# Patient Record
Sex: Female | Born: 1950
Health system: Southern US, Community
[De-identification: ages and names within clinical notes are randomized; demographics above are authoritative.]

## PROBLEM LIST (undated history)

## (undated) DIAGNOSIS — J679 Hypersensitivity pneumonitis due to unspecified organic dust: Secondary | ICD-10-CM

## (undated) DIAGNOSIS — J849 Interstitial pulmonary disease, unspecified: Secondary | ICD-10-CM

## (undated) DIAGNOSIS — E119 Type 2 diabetes mellitus without complications: Secondary | ICD-10-CM

## (undated) DIAGNOSIS — I10 Essential (primary) hypertension: Secondary | ICD-10-CM

## (undated) DIAGNOSIS — J9601 Acute respiratory failure with hypoxia: Secondary | ICD-10-CM

## (undated) DIAGNOSIS — E785 Hyperlipidemia, unspecified: Secondary | ICD-10-CM

## (undated) DIAGNOSIS — K219 Gastro-esophageal reflux disease without esophagitis: Secondary | ICD-10-CM

## (undated) DIAGNOSIS — I509 Heart failure, unspecified: Secondary | ICD-10-CM

## (undated) DIAGNOSIS — C9210 Chronic myeloid leukemia, BCR/ABL-positive, not having achieved remission: Secondary | ICD-10-CM

## (undated) DIAGNOSIS — D649 Anemia, unspecified: Secondary | ICD-10-CM

## (undated) HISTORY — PX: ABDOMINAL HYSTERECTOMY: SHX81

---
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
## 2017-11-05 DIAGNOSIS — C9210 Chronic myeloid leukemia, BCR/ABL-positive, not having achieved remission: Secondary | ICD-10-CM | POA: Insufficient documentation

## 2017-11-05 DIAGNOSIS — I10 Essential (primary) hypertension: Secondary | ICD-10-CM | POA: Insufficient documentation

## 2017-12-17 ENCOUNTER — Encounter (HOSPITAL_COMMUNITY): Payer: Self-pay | Admitting: Family Medicine

## 2017-12-17 ENCOUNTER — Ambulatory Visit (INDEPENDENT_AMBULATORY_CARE_PROVIDER_SITE_OTHER): Payer: Medicare Other

## 2017-12-17 ENCOUNTER — Ambulatory Visit (INDEPENDENT_AMBULATORY_CARE_PROVIDER_SITE_OTHER)
Admission: EM | Admit: 2017-12-17 | Discharge: 2017-12-17 | Disposition: A | Discharge status: Home / Self Care | Payer: Medicare Other | Source: Home / Self Care | Attending: Family Medicine | Admitting: Family Medicine

## 2017-12-17 ENCOUNTER — Inpatient Hospital Stay (HOSPITAL_COMMUNITY)
Admission: EM | Admit: 2017-12-17 | Discharge: 2017-12-20 | DRG: 196 | Disposition: A | Discharge status: Home / Self Care | Payer: Medicare Other | Attending: Family Medicine | Admitting: Family Medicine

## 2017-12-17 ENCOUNTER — Emergency Department (HOSPITAL_COMMUNITY): Payer: Medicare Other

## 2017-12-17 ENCOUNTER — Encounter (HOSPITAL_COMMUNITY): Payer: Self-pay

## 2017-12-17 DIAGNOSIS — D696 Thrombocytopenia, unspecified: Secondary | ICD-10-CM | POA: Diagnosis present

## 2017-12-17 DIAGNOSIS — R0600 Dyspnea, unspecified: Secondary | ICD-10-CM | POA: Diagnosis not present

## 2017-12-17 DIAGNOSIS — J9801 Acute bronchospasm: Secondary | ICD-10-CM | POA: Diagnosis present

## 2017-12-17 DIAGNOSIS — R0902 Hypoxemia: Secondary | ICD-10-CM

## 2017-12-17 DIAGNOSIS — E119 Type 2 diabetes mellitus without complications: Secondary | ICD-10-CM | POA: Diagnosis not present

## 2017-12-17 DIAGNOSIS — E785 Hyperlipidemia, unspecified: Secondary | ICD-10-CM | POA: Diagnosis present

## 2017-12-17 DIAGNOSIS — I700 Atherosclerosis of aorta: Secondary | ICD-10-CM | POA: Diagnosis present

## 2017-12-17 DIAGNOSIS — K219 Gastro-esophageal reflux disease without esophagitis: Secondary | ICD-10-CM | POA: Diagnosis present

## 2017-12-17 DIAGNOSIS — Z7984 Long term (current) use of oral hypoglycemic drugs: Secondary | ICD-10-CM | POA: Diagnosis not present

## 2017-12-17 DIAGNOSIS — Z7951 Long term (current) use of inhaled steroids: Secondary | ICD-10-CM | POA: Diagnosis not present

## 2017-12-17 DIAGNOSIS — E1165 Type 2 diabetes mellitus with hyperglycemia: Secondary | ICD-10-CM | POA: Diagnosis present

## 2017-12-17 DIAGNOSIS — Z79899 Other long term (current) drug therapy: Secondary | ICD-10-CM

## 2017-12-17 DIAGNOSIS — J679 Hypersensitivity pneumonitis due to unspecified organic dust: Principal | ICD-10-CM | POA: Diagnosis present

## 2017-12-17 DIAGNOSIS — Z888 Allergy status to other drugs, medicaments and biological substances status: Secondary | ICD-10-CM

## 2017-12-17 DIAGNOSIS — Z6841 Body Mass Index (BMI) 40.0 and over, adult: Secondary | ICD-10-CM

## 2017-12-17 DIAGNOSIS — J189 Pneumonia, unspecified organism: Secondary | ICD-10-CM

## 2017-12-17 DIAGNOSIS — J9601 Acute respiratory failure with hypoxia: Secondary | ICD-10-CM | POA: Diagnosis present

## 2017-12-17 DIAGNOSIS — J9621 Acute and chronic respiratory failure with hypoxia: Secondary | ICD-10-CM

## 2017-12-17 DIAGNOSIS — E118 Type 2 diabetes mellitus with unspecified complications: Secondary | ICD-10-CM

## 2017-12-17 DIAGNOSIS — T380X5A Adverse effect of glucocorticoids and synthetic analogues, initial encounter: Secondary | ICD-10-CM | POA: Diagnosis present

## 2017-12-17 DIAGNOSIS — J849 Interstitial pulmonary disease, unspecified: Secondary | ICD-10-CM | POA: Diagnosis present

## 2017-12-17 DIAGNOSIS — R0602 Shortness of breath: Secondary | ICD-10-CM

## 2017-12-17 DIAGNOSIS — B379 Candidiasis, unspecified: Secondary | ICD-10-CM | POA: Diagnosis present

## 2017-12-17 DIAGNOSIS — J9600 Acute respiratory failure, unspecified whether with hypoxia or hypercapnia: Secondary | ICD-10-CM

## 2017-12-17 DIAGNOSIS — Z794 Long term (current) use of insulin: Secondary | ICD-10-CM

## 2017-12-17 DIAGNOSIS — I10 Essential (primary) hypertension: Secondary | ICD-10-CM | POA: Diagnosis present

## 2017-12-17 DIAGNOSIS — J984 Other disorders of lung: Secondary | ICD-10-CM | POA: Diagnosis present

## 2017-12-17 HISTORY — DX: Hypersensitivity pneumonitis due to unspecified organic dust: J67.9

## 2017-12-17 HISTORY — DX: Type 2 diabetes mellitus without complications: E11.9

## 2017-12-17 HISTORY — DX: Essential (primary) hypertension: I10

## 2017-12-17 LAB — CBC
HCT: 43.6 % (ref 36.0–46.0)
Hemoglobin: 13.6 g/dL (ref 12.0–15.0)
MCH: 30.3 pg (ref 26.0–34.0)
MCHC: 31.2 g/dL (ref 30.0–36.0)
MCV: 97.1 fL (ref 78.0–100.0)
Platelets: 76 10*3/uL — ABNORMAL LOW (ref 150–400)
RBC: 4.49 MIL/uL (ref 3.87–5.11)
RDW: 21.1 % — ABNORMAL HIGH (ref 11.5–15.5)
WBC: 5.7 10*3/uL (ref 4.0–10.5)

## 2017-12-17 LAB — BASIC METABOLIC PANEL
Anion gap: 9 (ref 5–15)
BUN: 6 mg/dL — ABNORMAL LOW (ref 8–23)
CO2: 22 mmol/L (ref 22–32)
Calcium: 9.7 mg/dL (ref 8.9–10.3)
Chloride: 108 mmol/L (ref 98–111)
Creatinine, Ser: 0.7 mg/dL (ref 0.44–1.00)
GFR calc Af Amer: >60 mL/min (ref >60)
GFR calc non Af Amer: >60 mL/min (ref >60)
Glucose, Bld: 202 mg/dL — ABNORMAL HIGH (ref 70–99)
Potassium: 4.1 mmol/L (ref 3.5–5.1)
Sodium: 139 mmol/L (ref 135–145)

## 2017-12-17 LAB — BRAIN NATRIURETIC PEPTIDE: B Natriuretic Peptide: 9.1 pg/mL (ref 0.0–100.0)

## 2017-12-17 LAB — GLUCOSE, CAPILLARY
Glucose-Capillary: 367 mg/dL — ABNORMAL HIGH (ref 70–99)
Glucose-Capillary: 424 mg/dL — ABNORMAL HIGH (ref 70–99)

## 2017-12-17 LAB — I-STAT TROPONIN, ED: Troponin i, poc: 0.02 ng/mL (ref 0.00–0.08)

## 2017-12-17 MED ORDER — ONDANSETRON HCL 4 MG PO TABS: 4.0000 mg | ORAL_TABLET | Freq: Four times a day (QID) | ORAL | Status: DC | PRN

## 2017-12-17 MED ORDER — ENOXAPARIN SODIUM 40 MG/0.4ML ~~LOC~~ SOLN
40.0000 mg | SUBCUTANEOUS | Status: DC
  Administered 2017-12-17 – 2017-12-19 (×3): 40 mg via SUBCUTANEOUS
  Filled 2017-12-17 (×3): qty 0.4

## 2017-12-17 MED ORDER — IPRATROPIUM-ALBUTEROL 0.5-2.5 (3) MG/3ML IN SOLN
3.0000 mL | Freq: Once | RESPIRATORY_TRACT | Status: AC
  Administered 2017-12-17: 3 mL via RESPIRATORY_TRACT

## 2017-12-17 MED ORDER — IRBESARTAN 300 MG PO TABS
300.0000 mg | ORAL_TABLET | Freq: Every day | ORAL | Status: DC
  Administered 2017-12-17 – 2017-12-20 (×4): 300 mg via ORAL
  Filled 2017-12-17 (×4): qty 1

## 2017-12-17 MED ORDER — ONDANSETRON HCL 4 MG/2ML IJ SOLN: 4.0000 mg | Freq: Four times a day (QID) | INTRAMUSCULAR | Status: DC | PRN

## 2017-12-17 MED ORDER — TRAMADOL-ACETAMINOPHEN 37.5-325 MG PO TABS
1.0000 | ORAL_TABLET | Freq: Four times a day (QID) | ORAL | Status: DC | PRN
  Administered 2017-12-18 – 2017-12-20 (×3): 1 via ORAL
  Filled 2017-12-17 (×3): qty 1

## 2017-12-17 MED ORDER — INSULIN ASPART 100 UNIT/ML ~~LOC~~ SOLN
0.0000 [IU] | Freq: Every day | SUBCUTANEOUS | Status: DC
  Administered 2017-12-17: 5 [IU] via SUBCUTANEOUS

## 2017-12-17 MED ORDER — VITAMIN D (ERGOCALCIFEROL) 1.25 MG (50000 UNIT) PO CAPS
50000.0000 [IU] | ORAL_CAPSULE | ORAL | Status: DC
  Administered 2017-12-20: 50000 [IU] via ORAL
  Filled 2017-12-17: qty 1

## 2017-12-17 MED ORDER — IPRATROPIUM-ALBUTEROL 0.5-2.5 (3) MG/3ML IN SOLN
RESPIRATORY_TRACT | Status: AC
  Filled 2017-12-17: qty 3

## 2017-12-17 MED ORDER — IOPAMIDOL (ISOVUE-370) INJECTION 76%
INTRAVENOUS | Status: AC
  Filled 2017-12-17: qty 100

## 2017-12-17 MED ORDER — METHYLPREDNISOLONE SODIUM SUCC 125 MG IJ SOLR
125.0000 mg | Freq: Once | INTRAMUSCULAR | Status: AC
  Administered 2017-12-17: 125 mg via INTRAVENOUS
  Filled 2017-12-17: qty 2

## 2017-12-17 MED ORDER — SODIUM CHLORIDE 0.9 % IV SOLN
INTRAVENOUS | Status: DC
  Administered 2017-12-17 – 2017-12-18 (×2): via INTRAVENOUS

## 2017-12-17 MED ORDER — INSULIN LISPRO PROT & LISPRO (75-25 MIX) 100 UNIT/ML KWIKPEN: 40.0000 [IU] | PEN_INJECTOR | SUBCUTANEOUS | Status: DC

## 2017-12-17 MED ORDER — DOCUSATE SODIUM 100 MG PO CAPS
100.0000 mg | ORAL_CAPSULE | Freq: Two times a day (BID) | ORAL | Status: DC
  Administered 2017-12-17 – 2017-12-20 (×6): 100 mg via ORAL
  Filled 2017-12-17 (×6): qty 1

## 2017-12-17 MED ORDER — METHYLPREDNISOLONE SODIUM SUCC 125 MG IJ SOLR
INTRAMUSCULAR | Status: AC
  Filled 2017-12-17: qty 2

## 2017-12-17 MED ORDER — OXYBUTYNIN CHLORIDE ER 10 MG PO TB24
10.0000 mg | ORAL_TABLET | Freq: Every day | ORAL | Status: DC
  Administered 2017-12-18 – 2017-12-19 (×3): 10 mg via ORAL
  Filled 2017-12-17 (×3): qty 1

## 2017-12-17 MED ORDER — METHYLPREDNISOLONE SODIUM SUCC 125 MG IJ SOLR
80.0000 mg | Freq: Four times a day (QID) | INTRAMUSCULAR | Status: DC
  Administered 2017-12-17 – 2017-12-18 (×2): 80 mg via INTRAVENOUS
  Filled 2017-12-17 (×2): qty 2

## 2017-12-17 MED ORDER — INSULIN ASPART 100 UNIT/ML ~~LOC~~ SOLN
10.0000 [IU] | Freq: Once | SUBCUTANEOUS | Status: AC
  Administered 2017-12-18: 10 [IU] via SUBCUTANEOUS

## 2017-12-17 MED ORDER — ATORVASTATIN CALCIUM 20 MG PO TABS
20.0000 mg | ORAL_TABLET | Freq: Every day | ORAL | Status: DC
  Administered 2017-12-18 – 2017-12-20 (×3): 20 mg via ORAL
  Filled 2017-12-17 (×3): qty 1

## 2017-12-17 MED ORDER — IOPAMIDOL (ISOVUE-370) INJECTION 76%
100.0000 mL | Freq: Once | INTRAVENOUS | Status: AC | PRN
  Administered 2017-12-17: 100 mL via INTRAVENOUS

## 2017-12-17 MED ORDER — INSULIN ASPART 100 UNIT/ML ~~LOC~~ SOLN
0.0000 [IU] | Freq: Three times a day (TID) | SUBCUTANEOUS | Status: DC
  Administered 2017-12-18: 5 [IU] via SUBCUTANEOUS

## 2017-12-17 MED ORDER — MONTELUKAST SODIUM 10 MG PO TABS
10.0000 mg | ORAL_TABLET | Freq: Every day | ORAL | Status: DC
  Administered 2017-12-17 – 2017-12-19 (×3): 10 mg via ORAL
  Filled 2017-12-17 (×3): qty 1

## 2017-12-17 MED ORDER — IPRATROPIUM-ALBUTEROL 0.5-2.5 (3) MG/3ML IN SOLN: 3.0000 mL | Freq: Four times a day (QID) | RESPIRATORY_TRACT | Status: DC | PRN

## 2017-12-17 MED ORDER — METHYLPREDNISOLONE SODIUM SUCC 125 MG IJ SOLR
125.0000 mg | Freq: Once | INTRAMUSCULAR | Status: AC
  Administered 2017-12-17: 125 mg via INTRAVENOUS

## 2017-12-17 MED ORDER — LORATADINE 10 MG PO TABS
10.0000 mg | ORAL_TABLET | Freq: Every day | ORAL | Status: DC
  Administered 2017-12-18 – 2017-12-20 (×3): 10 mg via ORAL
  Filled 2017-12-17 (×3): qty 1

## 2017-12-17 MED ORDER — INSULIN ASPART PROT & ASPART (70-30 MIX) 100 UNIT/ML ~~LOC~~ SUSP
40.0000 [IU] | Freq: Every day | SUBCUTANEOUS | Status: DC
  Administered 2017-12-18: 40 [IU] via SUBCUTANEOUS
  Filled 2017-12-17: qty 10

## 2017-12-17 MED ORDER — INSULIN ASPART 100 UNIT/ML ~~LOC~~ SOLN: 0.0000 [IU] | Freq: Three times a day (TID) | SUBCUTANEOUS | Status: DC

## 2017-12-17 MED ORDER — IPRATROPIUM-ALBUTEROL 0.5-2.5 (3) MG/3ML IN SOLN
3.0000 mL | Freq: Four times a day (QID) | RESPIRATORY_TRACT | Status: DC
  Administered 2017-12-17: 3 mL via RESPIRATORY_TRACT
  Filled 2017-12-17: qty 3

## 2017-12-17 MED ORDER — GLIMEPIRIDE 2 MG PO TABS
2.0000 mg | ORAL_TABLET | Freq: Every day | ORAL | Status: DC
  Administered 2017-12-18: 2 mg via ORAL
  Filled 2017-12-17: qty 1

## 2017-12-17 MED ORDER — FEXOFENADINE HCL 180 MG PO TABS: 180.0000 mg | ORAL_TABLET | Freq: Every day | ORAL | Status: DC

## 2017-12-17 MED ORDER — FAMOTIDINE 20 MG PO TABS
20.0000 mg | ORAL_TABLET | Freq: Two times a day (BID) | ORAL | Status: DC
  Administered 2017-12-17 – 2017-12-20 (×6): 20 mg via ORAL
  Filled 2017-12-17 (×6): qty 1

## 2017-12-17 MED ORDER — BENZONATATE 100 MG PO CAPS
100.0000 mg | ORAL_CAPSULE | Freq: Three times a day (TID) | ORAL | Status: DC | PRN
  Administered 2017-12-17 – 2017-12-20 (×2): 100 mg via ORAL
  Filled 2017-12-17 (×3): qty 1

## 2017-12-17 NOTE — Discharge Instructions (Addendum)
Because the oxygen level in your blood is low we are taking you to the Emergency Department for further care.

## 2017-12-17 NOTE — ED Provider Notes (Signed)
Patient placed in Quick Look pathway, seen and evaluated   Chief Complaint: Cough. SOB  HPI:   67 year old presents with cough, dyspnea on exertion, and palpitations. She is from [facility]. Her coughing has been going on about a year. She has been given cough medicine and inhalers and it hasn't improved. On Tuesday she started "feeling bad" She states that she has become SOB, especially with walking. She has been coughing more and has had a fast heart rate. She has had intermittent chest pain with exertion but denies currently. She states that she has back pain and her ribs feel sore. She is staying with her daughter and last night was feeling worse. Today they went to UC and was noted to be wheezing. She was given a steroid and breathing tx and was not better so was sent to the ED.  The SOB gets better with rest but the cough does not. No recent surgery/travel/immobilization, hx of cancer, leg swelling, hemoptysis, prior DVT/PE, or hormone use.  ROS: +cough, SOB  Physical Exam:   Gen: No distress  Neuro: Awake and Alert  Skin: Warm    Focused Exam: Heart: Tachycardic and regular    Lungs: CTA, tachypnea    Extremities: Bilateral foot and ankle edema   Initiation of care has begun. The patient has been counseled on the process, plan, and necessity for staying for the completion/evaluation, and the remainder of the medical screening examination    Recardo Evangelist, PA-C 12/17/17 1455    Gareth Morgan, MD 12/19/17 1450

## 2017-12-17 NOTE — ED Notes (Signed)
Attempted report 

## 2017-12-17 NOTE — ED Triage Notes (Signed)
Pt c/o wheezing, sob over the weekend. Saturations 89%, pt takign deep breaths, goes up to 92, then drops back down to 89%.

## 2017-12-17 NOTE — Progress Notes (Signed)
Patient cbg 367 and is going to eat and be taking IV solumedrol. Garba,MD paged that there was no insulin nighttime coverage. Garba,MD gave verbal order with readback for RN to place insulin bedtime orders for patient. Orders placed, will continue to monitor and treat per MD orders.

## 2017-12-17 NOTE — ED Notes (Signed)
Notified kelly moon, rn of patient.s o2 sats 90-89.  Will place on o2 at triage. ED staff aware patient has a 22g saline lock in right hand.

## 2017-12-17 NOTE — ED Notes (Signed)
ED Provider at bedside. 

## 2017-12-17 NOTE — ED Notes (Signed)
o2 sat 89.  Dr hagler aware.  Placed patient on O2 and 2 L
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
## 2017-12-17 NOTE — ED Provider Notes (Signed)
[place] EMERGENCY DEPARTMENT Provider Note   CSN: [id number] Arrival date & time: 12/17/17  1419     History   Chief Complaint Chief Complaint  Patient presents with  . Shortness of Breath    HPI Kathy Irwin is a 67 y.o. female.  HPI Patient presents to the emergency room with complaints of cough and shortness of breath.  Patient states the symptoms have been going on for at least 6 months to a year.  She states she saw Dr. several months ago and was prescribed inhalers and medications but none of that really helped.  Patient has not seen anyone since that time.  Patient states her symptoms have been getting worse.  She has noticed shortness of breath with walking.  She feels like her heart starts to race and her oxygen level drops.  Patient has noticed some leg swelling but is not really any more severe than usual.  Patient went to an urgent care and they noted that she was wheezing.  She was given a breathing treatment and steroids so she was sent to the ED. Past Medical History:  Diagnosis Date  . Diabetes mellitus without complication ([place])   . Hypertension     Patient Active Problem List   Diagnosis Date Noted  . Pneumonitis 12/17/2017    History reviewed. No pertinent surgical history.   OB History   None      Home Medications    Prior to Admission medications   Medication Sig Start Date End Date Taking? Authorizing Provider  atorvastatin (LIPITOR) 20 MG tablet Take 20 mg by mouth daily.    [provider]  Azelastine-Fluticasone (DYMISTA NA) Place into the nose.    [provider]  benzonatate (TESSALON) 100 MG capsule Take by mouth 3 (three) times daily as needed for cough.    [provider]  Ergocalciferol (VITAMIN D2 PO) Take by mouth.    [provider]  Fexofenadine HCl (MUCINEX ALLERGY PO) Take by mouth.    [provider]  glimepiride (AMARYL) 2 MG tablet Take 2 mg by mouth daily with  breakfast.    [provider]  loratadine (CLARITIN) 10 MG tablet Take 10 mg by mouth daily.    [provider]  metFORMIN (GLUCOPHAGE) 1000 MG tablet Take 1,000 mg by mouth 2 (two) times daily with a meal.    [provider]  montelukast (SINGULAIR) 10 MG tablet Take 10 mg by mouth at bedtime.    [provider]  olmesartan (BENICAR) 40 MG tablet Take 40 mg by mouth daily.    [provider]  oxybutynin (DITROPAN-XL) 10 MG 24 hr tablet Take 10 mg by mouth at bedtime.    [provider]  ranitidine (ZANTAC) 150 MG tablet Take 150 mg by mouth 2 (two) times daily.    [provider]  traMADol-acetaminophen (ULTRACET) 37.5-325 MG tablet Take 1 tablet by mouth every 6 (six) hours as needed.    [provider]    Family History No family history on file.  Social History Social History   Tobacco Use  . Smoking status: Never Smoker  . Smokeless tobacco: Never Used  Substance Use Topics  . Alcohol use: Not on file  . Drug use: Not on file     Allergies   Patient has no known allergies.   Review of Systems Review of Systems  All other systems reviewed and are negative.    Physical Exam Updated Vital Signs  BP (!) 138/56   Pulse (!) 122   Temp 98.1 F (36.7 C) (Oral)   Resp (!) 26   Ht 1.626 m (5' 4")   Wt 119.3 kg   SpO2 95%   BMI 45.14 kg/m   Physical Exam  Constitutional: She appears well-developed and well-nourished. No distress.  HENT:  Head: Normocephalic and atraumatic.  Right Ear: External ear normal.  Left Ear: External ear normal.  Eyes: Conjunctivae are normal. Right eye exhibits no discharge. Left eye exhibits no discharge. No scleral icterus.  Neck: Neck supple. No tracheal deviation present.  Cardiovascular: Regular rhythm and intact distal pulses. Tachycardia present.  Pulmonary/Chest: Effort normal and breath sounds normal. No stridor. No respiratory distress. She has no wheezes. She  has no rales.  Abdominal: Soft. Bowel sounds are normal. She exhibits no distension. There is no tenderness. There is no rebound and no guarding.  Musculoskeletal: She exhibits no tenderness.       Right lower leg: She exhibits edema.       Left lower leg: She exhibits edema.  Neurological: She is alert. She has normal strength. No cranial nerve deficit (no facial droop, extraocular movements intact, no slurred speech) or sensory deficit. She exhibits normal muscle tone. She displays no seizure activity. Coordination normal.  Skin: Skin is warm and dry. No rash noted.  Psychiatric: She has a normal mood and affect.  Nursing note and vitals reviewed.    ED Treatments / Results  Labs (all labs ordered are listed, but only abnormal results are displayed) Labs Reviewed  BASIC METABOLIC PANEL - Abnormal; Notable for the following components:      Result Value   Glucose, Bld 202 (*)    BUN 6 (*)    All other components within normal limits  CBC - Abnormal; Notable for the following components:   RDW 21.1 (*)    Platelets 76 (*)    All other components within normal limits  BRAIN NATRIURETIC PEPTIDE  I-STAT TROPONIN, ED    EKG EKG Interpretation  Date/Time:  Monday December 17 2017 14:27:29 EDT Ventricular Rate:  118 PR Interval:  152 QRS Duration: 78 QT Interval:  328 QTC Calculation: 459 R Axis:   -5 Text Interpretation:  Sinus tachycardia Minimal voltage criteria for LVH, may be normal variant Cannot rule out Anterior infarct , age undetermined Abnormal ECG No old tracing to compare Confirmed by Dorie Rank [phone]) on 12/17/2017 4:23:38 PM   Radiology Dg Chest 2 View  Result Date: 12/17/2017 CLINICAL DATA:  Chronic shortness of breath and wheezing. EXAM: CHEST - 2 VIEW COMPARISON:  None. FINDINGS: The heart size is normal. Mild fullness of the bilateral hila. Normal pulmonary vascularity. Mid to lower lung predominant increased interstitial markings with peribronchial  thickening. No focal consolidation, pleural effusion, or pneumothorax. No acute osseous abnormality. IMPRESSION: Bilateral mid to lower lung predominant interstitial thickening concerning for interstitial pneumonitis secondary to an infectious or inflammatory etiology. Given chronic symptoms, further evaluation with high-resolution chest CT may be beneficial. Electronically Signed   By: Titus Dubin M.D.   On: 12/17/2017 13:44   Ct Angio Chest Pe W/cm &/or Wo Cm  Result Date: 12/17/2017 CLINICAL DATA:  67 year old female with history of shortness of breath and chest pain. EXAM: CT ANGIOGRAPHY CHEST WITH CONTRAST TECHNIQUE: Multidetector CT imaging of the chest was performed using the standard protocol during bolus administration of intravenous contrast. Multiplanar CT image reconstructions and MIPs were obtained to evaluate the vascular anatomy. CONTRAST:  130m ISOVUE-370 IOPAMIDOL (ISOVUE-370) INJECTION 76% COMPARISON:  None. FINDINGS: Comment: Study is slightly limited by patient respiratory motion, which excludes accurate evaluation for small subsegmental sized pulmonary emboli. Cardiovascular: No filling defects within the central, lobar or segmental sized pulmonary arterial tree to suggest clinically relevant pulmonary embolism. Smaller subsegmental sized emboli cannot be entirely excluded secondary to respiratory motion. Pulmonic trunk is dilated measuring 3.9 cm in diameter. Heart size is borderline enlarged. There is no significant pericardial fluid, thickening or pericardial calcification. Mild atherosclerosis in the thoracic aorta. No coronary artery calcifications. Mild calcifications of the aortic valve. Mediastinum/Nodes: No pathologically enlarged mediastinal or hilar lymph nodes. Esophagus is unremarkable in appearance. No axillary lymphadenopathy. Lungs/Pleura: Widespread patchy areas of ground-glass attenuation, septal thickening and thickening of the peribronchovascular interstitium noted  throughout the lungs bilaterally, with interspersed areas of lucency. No honeycombing identified. No pleural effusions. Scattered areas of mild cylindrical traction bronchiectasis, most evident in the right lower lobe. No definite suspicious appearing pulmonary nodules or masses. 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 Possible calcified gallstone in the neck of the gallbladder measuring 16 mm. Musculoskeletal: There are no aggressive appearing lytic or blastic lesions noted in the visualized portions of the skeleton. Review of the MIP images confirms the above findings. IMPRESSION: 1. Despite the limitations of today's examination, there is no evidence to suggest clinically relevant central, lobar or segmental sized pulmonary embolism. 2. 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 Outpatient referral to pulmonology for further evaluation is recommended in the near future. Additionally, follow-up outpatient high-resolution chest CT should be considered. 3. Dilatation of the pulmonic trunk (3.9 cm in diameter), concerning for pulmonary arterial hypertension. 4. Aortic atherosclerosis. 5. There are calcifications of the aortic valve. Echocardiographic correlation for evaluation of potential valvular dysfunction may be warranted if clinically indicated. 6. 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 Further evaluation with nonemergent MRI of the abdomen with and without IV gadolinium is suggested to exclude the possibility of underlying gallbladder mass. Aortic Atherosclerosis (ICD10-I70.0). Electronically Signed   By: DVinnie LangtonM.D.   On: 12/17/2017 17:26    Procedures Procedures (including critical care time)  Medications Ordered in ED Medications  methylPREDNISolone sodium  succinate (SOLU-MEDROL) 125 mg/2 mL injection 125 mg (has no administration in time range)  iopamidol (ISOVUE-370) 76 % injection 100 mL (100 mLs Intravenous Contrast Given 12/17/17 1623)     Initial Impression / Assessment and Plan / ED Course  I have reviewed the triage vital signs and the nursing notes.  Pertinent labs & imaging results that were available during my care of the patient were reviewed by me and considered in my medical decision making (see chart for details).  Clinical Course as of Dec 18 1819  Mon Dec 17, 2017  1714 Initial laboratory tests are reassuring.  No signs of cardiac ischemia or congestive heart failure.  Patient is tachycardic and tachypnea with low oxygen saturation when she is not on oxygen   [JK]  1715 Outpatient chest x-ray showed inflammation suggestive of a pneumonitis.   [JK]    Clinical Course User Index [JK] KDorie Rank MD    Patient presented to the emergency room for progressive shortness of breath gradually worsening over the last several months.  Patient was noted to have hypoxia on room air.  Symptoms increased with any exertion.  Patient's laboratory tests are unremarkable.  Her CT scan does not show pulmonary embolism but there are findings suggestive of an interstitial pneumonitis.  Unclear what is triggering this specifically.  Patient denies vaping and denies any chemical exposures.  She states she works from home.  Patient also has findings suggestive the possibility of pulmonary hypertension.  Patient is requiring supplemental oxygen.  She will need be admitted to the hospital for further pulmonary evaluation.  Final Clinical Impressions(s) / ED Diagnoses   Final diagnoses:  Pneumonitis  Hypoxia     Dorie Rank, MD 12/17/17 [phone]

## 2017-12-17 NOTE — ED Notes (Signed)
Attempted to transport pt. Admitting, MD arrived. Pt to be transported after.

## 2017-12-17 NOTE — ED Notes (Signed)
Pt increased to 4L [place].

## 2017-12-17 NOTE — H&P (Signed)
History and Physical   Kathy Irwin NWG:[id number] DOB: Dec 03, 1950 DOA: 12/17/2017  Referring MD/NP/PA: Dr Marye Round  PCP: System, Provider Not In   Outpatient Specialists: None   Patient coming from: Home. Originally from [place]  Chief Complaint: Shortness of breath  HPI: Kathy Irwin is a 67 y.o. female with medical history significant of Diabetes, HTN, Hyperlipidemia, Morbid obesity and Bronchospasm who has noticed recurrent SOB and wheezing whenever she is smells strong scents.  She also noticed that any smoke or perfume that is strong can trigger attack.  She is here now visiting her sister from [place] when this started happening about a week ago.  She cannot remember where she was exposed to.  Patient came to the ER with significant shortness of breath and wheezing.  No prior history of asthma although she was given some inhalers couple of weeks ago by her physician.  She also went to urgent care where she was seen and evaluated and found to be wheezing so sent over to the ER.  She is still having significant wheezing.  Mild cough.  Nonproductive..  ED Course: Patient's temperature is 98.6 her blood pressure is 123/79 pulse 122 respiratory rate of 28 oxygen sat 89% on room air.  Her electrolytes are normal except for blood sugar of 202.  CBC is entirely normal.  CT angiogram of the chest showed no pulmonary embolism.  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  Dilatation of the pulmonary trunk concerning for pulmonary arterial hypertension.  Aortic atherosclerosis also noted.  Patient was treated with nebulizer and started on IV steroids.  She has been admitted to the hospital for Review of Systems: As per HPI otherwise 10 point review of systems negative.    Past Medical History:  Diagnosis Date  . Diabetes mellitus without complication ([place])   . Hypertension     History reviewed. No pertinent surgical  history.   reports that she has never smoked. She has never used smokeless tobacco. Her alcohol and drug histories are not on file.  No Known Allergies  No family history on file.   Prior to Admission medications   Medication Sig Start Date End Date Taking? Authorizing Provider  atorvastatin (LIPITOR) 20 MG tablet Take 20 mg by mouth daily.    [provider]  Azelastine-Fluticasone (DYMISTA NA) Place into the nose.    [provider]  benzonatate (TESSALON) 100 MG capsule Take by mouth 3 (three) times daily as needed for cough.    [provider]  Ergocalciferol (VITAMIN D2 PO) Take by mouth.    [provider]  Fexofenadine HCl (MUCINEX ALLERGY PO) Take by mouth.    [provider]  glimepiride (AMARYL) 2 MG tablet Take 2 mg by mouth daily with breakfast.    [provider]  loratadine (CLARITIN) 10 MG tablet Take 10 mg by mouth daily.    [provider]  metFORMIN (GLUCOPHAGE) 1000 MG tablet Take 1,000 mg by mouth 2 (two) times daily with a meal.    [provider]  montelukast (SINGULAIR) 10 MG tablet Take 10 mg by mouth at bedtime.    [provider]  olmesartan (BENICAR) 40 MG tablet Take 40 mg by mouth daily.    [provider]  oxybutynin (DITROPAN-XL) 10 MG 24 hr tablet Take 10 mg by mouth at bedtime.    [provider]  ranitidine (ZANTAC) 150 MG tablet Take 150 mg by mouth 2 (two)  times daily.    [provider]  traMADol-acetaminophen (ULTRACET) 37.5-325 MG tablet Take 1 tablet by mouth every 6 (six) hours as needed.    [provider]    Physical Exam: Vitals:   12/17/17 1615 12/17/17 1715 12/17/17 1745 12/17/17 1800  BP: 127/66  138/60 (!) 138/56  Pulse: (!) 117 (!) 119 (!) 122   Resp: (!) 28 (!) 23 (!) 26   Temp:      TempSrc:      SpO2: 92% 93% 96% 95%  Weight:      Height:          Constitutional: NAD, calm, comfortable Vitals:   12/17/17  1615 12/17/17 1715 12/17/17 1745 12/17/17 1800  BP: 127/66  138/60 (!) 138/56  Pulse: (!) 117 (!) 119 (!) 122   Resp: (!) 28 (!) 23 (!) 26   Temp:      TempSrc:      SpO2: 92% 93% 96% 95%  Weight:      Height:        Morbidly obese in no acute distress Eyes: PERRL, lids and conjunctivae normal ENMT: Mucous membranes are moist. Posterior pharynx clear of any exudate or lesions.Normal dentition.  Neck: normal, supple, no masses, no thyromegaly Respiratory: Fair air entry bilaterally, diffuse expiratory wheezing, no crackles. Normal respiratory effort. No accessory muscle use.  Cardiovascular: Regular rate and rhythm, no murmurs / rubs / gallops. No extremity edema. 2+ pedal pulses. No carotid bruits.  Abdomen: no tenderness, no masses palpated. No hepatosplenomegaly. Bowel sounds positive.  Musculoskeletal: no clubbing / cyanosis. No joint deformity upper and lower extremities. Good ROM, no contractures. Normal muscle tone.  Skin: no rashes, lesions, ulcers. No induration Neurologic: CN 2-12 grossly intact. Sensation intact, DTR normal. Strength 5/5 in all 4.  Psychiatric: Normal judgment and insight. Alert and oriented x 3. Normal mood.     Labs on Admission: I have personally reviewed following labs and imaging studies  CBC: Recent Labs  Lab 12/17/17 1440  WBC 5.7  HGB 13.6  HCT 43.6  MCV 97.1  PLT 76*   Basic Metabolic Panel: Recent Labs  Lab 12/17/17 1440  NA 139  K 4.1  CL 108  CO2 22  GLUCOSE 202*  BUN 6*  CREATININE 0.70  CALCIUM 9.7   GFR: Estimated Creatinine Clearance: 86.7 mL/min (by C-G formula based on SCr of 0.7 mg/dL). Liver Function Tests: No results for input(s): AST, ALT, ALKPHOS, BILITOT, PROT, ALBUMIN in the last 168 hours. No results for input(s): LIPASE, AMYLASE in the last 168 hours. No results for input(s): AMMONIA in the last 168 hours. Coagulation Profile: No results for input(s): INR, PROTIME in the last 168 hours. Cardiac  Enzymes: No results for input(s): CKTOTAL, CKMB, CKMBINDEX, TROPONINI in the last 168 hours. BNP (last 3 results) No results for input(s): PROBNP in the last 8760 hours. HbA1C: No results for input(s): HGBA1C in the last 72 hours. CBG: No results for input(s): GLUCAP in the last 168 hours. Lipid Profile: No results for input(s): CHOL, HDL, LDLCALC, TRIG, CHOLHDL, LDLDIRECT in the last 72 hours. Thyroid Function Tests: No results for input(s): TSH, T4TOTAL, FREET4, T3FREE, THYROIDAB in the last 72 hours. Anemia Panel: No results for input(s): VITAMINB12, FOLATE, FERRITIN, TIBC, IRON, RETICCTPCT in the last 72 hours. Urine analysis: No results found for: COLORURINE, APPEARANCEUR, LABSPEC, PHURINE, GLUCOSEU, HGBUR, BILIRUBINUR, KETONESUR, PROTEINUR, UROBILINOGEN, NITRITE, LEUKOCYTESUR Sepsis Labs: _0 (procalcitonin:4,lacticidven:4) )No results found for this or any previous visit (from the past 240  hour(s)).   Radiological Exams on Admission: Dg Chest 2 View  Result Date: 12/17/2017 CLINICAL DATA:  Chronic shortness of breath and wheezing. EXAM: CHEST - 2 VIEW COMPARISON:  None. FINDINGS: The heart size is normal. Mild fullness of the bilateral hila. Normal pulmonary vascularity. Mid to lower lung predominant increased interstitial markings with peribronchial thickening. No focal consolidation, pleural effusion, or pneumothorax. No acute osseous abnormality. IMPRESSION: Bilateral mid to lower lung predominant interstitial thickening concerning for interstitial pneumonitis secondary to an infectious or inflammatory etiology. Given chronic symptoms, further evaluation with high-resolution chest CT may be beneficial. Electronically Signed   By: Titus Dubin M.D.   On: 12/17/2017 13:44   Ct Angio Chest Pe W/cm &/or Wo Cm  Result Date: 12/17/2017 CLINICAL DATA:  67 year old female with history of shortness of breath and chest pain. EXAM: CT ANGIOGRAPHY CHEST WITH CONTRAST TECHNIQUE:  Multidetector CT imaging of the chest was performed using the standard protocol during bolus administration of intravenous contrast. Multiplanar CT image reconstructions and MIPs were obtained to evaluate the vascular anatomy. CONTRAST:  135m ISOVUE-370 IOPAMIDOL (ISOVUE-370) INJECTION 76% COMPARISON:  None. FINDINGS: Comment: Study is slightly limited by patient respiratory motion, which excludes accurate evaluation for small subsegmental sized pulmonary emboli. Cardiovascular: No filling defects within the central, lobar or segmental sized pulmonary arterial tree to suggest clinically relevant pulmonary embolism. Smaller subsegmental sized emboli cannot be entirely excluded secondary to respiratory motion. Pulmonic trunk is dilated measuring 3.9 cm in diameter. Heart size is borderline enlarged. There is no significant pericardial fluid, thickening or pericardial calcification. Mild atherosclerosis in the thoracic aorta. No coronary artery calcifications. Mild calcifications of the aortic valve. Mediastinum/Nodes: No pathologically enlarged mediastinal or hilar lymph nodes. Esophagus is unremarkable in appearance. No axillary lymphadenopathy. Lungs/Pleura: Widespread patchy areas of ground-glass attenuation, septal thickening and thickening of the peribronchovascular interstitium noted throughout the lungs bilaterally, with interspersed areas of lucency. No honeycombing identified. No pleural effusions. Scattered areas of mild cylindrical traction bronchiectasis, most evident in the right lower lobe. No definite suspicious appearing pulmonary nodules or masses. 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 Possible calcified gallstone in the neck of the gallbladder measuring 16 mm. Musculoskeletal: There are no aggressive appearing lytic or blastic lesions noted in the visualized portions of the skeleton. Review of the MIP images confirms the above  findings. IMPRESSION: 1. Despite the limitations of today's examination, there is no evidence to suggest clinically relevant central, lobar or segmental sized pulmonary embolism. 2. 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 Outpatient referral to pulmonology for further evaluation is recommended in the near future. Additionally, follow-up outpatient high-resolution chest CT should be considered. 3. Dilatation of the pulmonic trunk (3.9 cm in diameter), concerning for pulmonary arterial hypertension. 4. Aortic atherosclerosis. 5. There are calcifications of the aortic valve. Echocardiographic correlation for evaluation of potential valvular dysfunction may be warranted if clinically indicated. 6. 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 Further evaluation with nonemergent MRI of the abdomen with and without IV gadolinium is suggested to exclude the possibility of underlying gallbladder mass. Aortic Atherosclerosis (ICD10-I70.0). Electronically Signed   By: DVinnie LangtonM.D.   On: 12/17/2017 17:26    EKG: Independently reviewed.  Sinus tachycardia with a rate of 118 was noted.  Evidence of LVH and nonspecific ST changes in the lateral leads.  Assessment/Plan Principal  Problem:   Hypoxia Active Problems:   Pneumonitis   DM (diabetes mellitus) ([place])   HTN (hypertension)     #1 acute hypoxemia: Secondary to recurrent bronchospasm.  Patient most likely has chronic interstitial lung disease with restrictive pattern but also obstructive pattern which is reactive to environmental agents like the most described by the patient.  She was given inhalers a few weeks ago.  We will admit her and treat as if she has bronchospasm from IV asthma.  Start her on steroids with nebulizer.  Patient was never a smoker.  She will need pulmonary  consultation probably pulmonary function tests to fully exciting her lung disease.  #2 diabetes: blood sugar is currently uncontrolled.  Patient is also going to be on steroids her blood sugar is likely to be uncontrolled.  Will place on sliding scale insulin with her home regimen of 70/30 insulin.  May need to increase the dose based on her response.  #3 hypertension: Blood pressure is controlled at this point.  Continue home regimen.  Adjust as needed.  #4 thrombocytopenia: Platelets are 76.  No obvious cause.  Will be careful with salt heparin products.     DVT prophylaxis: Lovenox Code Status: Full Family Communication: Sister who is with her in the ER Disposition Plan: Home Consults called: None but may need pulmonary in the morning Admission status: Inpatient  Severity of Illness: The appropriate patient status for this patient is INPATIENT. Inpatient status is judged to be reasonable and necessary in order to provide the required intensity of service to ensure the patient's safety. The patient's presenting symptoms, physical exam findings, and initial radiographic and laboratory data in the context of their chronic comorbidities is felt to place them at high risk for further clinical deterioration. Furthermore, it is not anticipated that the patient will be medically stable for discharge from the hospital within 2 midnights of admission. The following factors support the patient status of inpatient.   " The patient's presenting symptoms include shortness of breath and wheeze. " The worrisome physical exam findings include bilateral expiratory wheeze. " The initial radiographic and laboratory data are worrisome because of CT chest findings of possible pulmonary fibrosis. " The chronic co-morbidities include diabetes with recurrent bronchospasm.   * I certify that at the point of admission it is my clinical judgment that the patient will require inpatient hospital care spanning beyond  2 midnights from the point of admission due to high intensity of service, high risk for further deterioration and high frequency of surveillance required.Barbette Merino MD [facility] Pager [phone]  If 7PM-7AM, please contact night-coverage [email or web address] Password [facility]  12/17/2017, 6:32 PM

## 2017-12-17 NOTE — ED Provider Notes (Signed)
[place]   [id number] 12/17/17 Arrival Time: 5852  ASSESSMENT & PLAN:  1. Hypoxemia   2. SOB (shortness of breath)     Meds ordered this encounter  Medications  . ipratropium-albuterol (DUONEB) 0.5-2.5 (3) MG/3ML nebulizer solution 3 mL  . methylPREDNISolone sodium succinate (SOLU-MEDROL) 125 mg/2 mL injection 125 mg IV   Remains hypoxic on RA averaging around 90% at rest. Around 97% on 2L O2 via [name].  Dg Chest 2 View  Result Date: 12/17/2017 CLINICAL DATA:  Chronic shortness of breath and wheezing. EXAM: CHEST - 2 VIEW COMPARISON:  None. FINDINGS: The heart size is normal. Mild fullness of the bilateral hila. Normal pulmonary vascularity. Mid to lower lung predominant increased interstitial markings with peribronchial thickening. No focal consolidation, pleural effusion, or pneumothorax. No acute osseous abnormality. IMPRESSION: Bilateral mid to lower lung predominant interstitial thickening concerning for interstitial pneumonitis secondary to an infectious or inflammatory etiology. Given chronic symptoms, further evaluation with high-resolution chest CT may be beneficial. Electronically Signed   By: Titus Dubin M.D.   On: 12/17/2017 13:44   RN taking Ms Para to the ED now for further care and evaluation. Stable upon discharge.  Reviewed expectations re: course of current medical issues. Questions answered. Outlined signs and symptoms indicating need for more acute intervention. Patient verbalized understanding. After Visit Summary given.   SUBJECTIVE: History from: patient and family members. Much of reliable history is from her daughters who accompany her today.  Kathy Irwin is a 67 y.o. female who presents with complaint of fairly persistent shortness of breath. She reports gradual onset of symptoms over the past 2-3 days. Her daughters tell me this has been present for longer; maybe a week or two. No recent illnesses. Had albuterol inhaler at home that she  has been using prn without much relief. Now short of breath at rest with significant worsening upon exertion. No associated CP/n/v. No LE edema reported. No new medications. Normal PO intake.  Past Medical History:  Diagnosis Date  . Diabetes mellitus without complication ([place])   Reports blood sugars run 115-130 usually. No recent exacerbations.  Tobacco Use  Smoking Status Never Smoker   She is here visiting her daughters from [facility].  ROS: As per HPI. All other systems negative.   OBJECTIVE:  Vitals:   12/17/17 1234  BP: 136/76  Pulse: (!) 116  Resp: (!) 26  Temp: 98.3 F (36.8 C)  SpO2: 92%    General appearance: alert but appears uncomfortable Eyes: PERRLA; EOMI; conjunctiva normal HENT: normocephalic; atraumatic; oral mucosa normal Neck: supple  Lungs: tachypnea upon arrival; coarse breath sounds at bases of lungs; few expiratory wheezes that clear with deep breaths; able to speak short sentences but has to pause to catch breath in between Heart: tachycardic Abdomen: soft, non-tender; bowel sounds normal Extremities: no edema; symmetrical with no gross deformities Skin: warm and dry Neurologic: normal gait; normal symmetric reflexes Psychological: alert and cooperative; normal mood and affect  No Known Allergies  Past Medical History:  Diagnosis Date  . Diabetes mellitus without complication [facility])    Social History   Socioeconomic History  . Marital status: Married    Spouse name: Not on file  . Number of children: Not on file  . Years of education: Not on file  . Highest education level: Not on file  Occupational History  . Not on file  Social Needs  . Financial resource strain: Not on file  . Food insecurity:    Worry:  Not on file    Inability: Not on file  . Transportation needs:    Medical: Not on file    Non-medical: Not on file  Tobacco Use  . Smoking status: Never Smoker  Substance and Sexual Activity  . Alcohol use: Not on file  . Drug use:  Not on file  . Sexual activity: Not on file  Lifestyle  . Physical activity:    Days per week: Not on file    Minutes per session: Not on file  . Stress: Not on file  Relationships  . Social connections:    Talks on phone: Not on file    Gets together: Not on file    Attends religious service: Not on file    Active member of club or organization: Not on file    Attends meetings of clubs or organizations: Not on file    Relationship status: Not on file  . Intimate partner violence:    Fear of current or ex partner: Not on file    Emotionally abused: Not on file    Physically abused: Not on file    Forced sexual activity: Not on file  Other Topics Concern  . Not on file  Social History Narrative  . Not on file   FH: No lung disease reported.  History reviewed. No pertinent surgical history.   Vanessa Kick, MD 12/17/17 [phone]

## 2017-12-18 ENCOUNTER — Other Ambulatory Visit (HOSPITAL_COMMUNITY): Payer: Medicare Other

## 2017-12-18 ENCOUNTER — Other Ambulatory Visit: Payer: Self-pay

## 2017-12-18 DIAGNOSIS — R0902 Hypoxemia: Secondary | ICD-10-CM

## 2017-12-18 DIAGNOSIS — R0600 Dyspnea, unspecified: Secondary | ICD-10-CM

## 2017-12-18 LAB — COMPREHENSIVE METABOLIC PANEL
ALT: 18 U/L (ref 0–44)
AST: 23 U/L (ref 15–41)
Albumin: 4 g/dL (ref 3.5–5.0)
Alkaline Phosphatase: 67 U/L (ref 38–126)
Anion gap: 10 (ref 5–15)
BUN: 14 mg/dL (ref 8–23)
CO2: 20 mmol/L — ABNORMAL LOW (ref 22–32)
Calcium: 9.6 mg/dL (ref 8.9–10.3)
Chloride: 108 mmol/L (ref 98–111)
Creatinine, Ser: 0.87 mg/dL (ref 0.44–1.00)
GFR calc Af Amer: >60 mL/min (ref >60)
GFR calc non Af Amer: >60 mL/min (ref >60)
Glucose, Bld: 327 mg/dL — ABNORMAL HIGH (ref 70–99)
Potassium: 4.3 mmol/L (ref 3.5–5.1)
Sodium: 138 mmol/L (ref 135–145)
Total Bilirubin: 1.3 mg/dL — ABNORMAL HIGH (ref 0.3–1.2)
Total Protein: 6.5 g/dL (ref 6.5–8.1)

## 2017-12-18 LAB — GLUCOSE, CAPILLARY
Glucose-Capillary: 332 mg/dL — ABNORMAL HIGH (ref 70–99)
Glucose-Capillary: 307 mg/dL — ABNORMAL HIGH (ref 70–99)
Glucose-Capillary: 275 mg/dL — ABNORMAL HIGH (ref 70–99)
Glucose-Capillary: 301 mg/dL — ABNORMAL HIGH (ref 70–99)
Glucose-Capillary: 307 mg/dL — ABNORMAL HIGH (ref 70–99)
Glucose-Capillary: 253 mg/dL — ABNORMAL HIGH (ref 70–99)

## 2017-12-18 LAB — CBC
HCT: 41.4 % (ref 36.0–46.0)
Hemoglobin: 12.9 g/dL (ref 12.0–15.0)
MCH: 29.9 pg (ref 26.0–34.0)
MCHC: 31.2 g/dL (ref 30.0–36.0)
MCV: 95.8 fL (ref 78.0–100.0)
Platelets: 81 10*3/uL — ABNORMAL LOW (ref 150–400)
RBC: 4.32 MIL/uL (ref 3.87–5.11)
RDW: 21.1 % — ABNORMAL HIGH (ref 11.5–15.5)
WBC: 5.9 10*3/uL (ref 4.0–10.5)

## 2017-12-18 LAB — HIV ANTIBODY (ROUTINE TESTING W REFLEX): HIV Screen 4th Generation wRfx: NONREACTIVE

## 2017-12-18 MED ORDER — INSULIN ASPART 100 UNIT/ML ~~LOC~~ SOLN
0.0000 [IU] | Freq: Every day | SUBCUTANEOUS | Status: DC
  Administered 2017-12-18 – 2017-12-19 (×2): 3 [IU] via SUBCUTANEOUS

## 2017-12-18 MED ORDER — CLOTRIMAZOLE 2 % VA CREA
1.0000 | TOPICAL_CREAM | Freq: Every day | VAGINAL | Status: DC
  Administered 2017-12-18 – 2017-12-19 (×2): 1 via VAGINAL
  Filled 2017-12-18: qty 21

## 2017-12-18 MED ORDER — METFORMIN HCL 500 MG PO TABS: 1000.0000 mg | ORAL_TABLET | Freq: Two times a day (BID) | ORAL | Status: DC

## 2017-12-18 MED ORDER — INSULIN ASPART 100 UNIT/ML ~~LOC~~ SOLN
4.0000 [IU] | Freq: Three times a day (TID) | SUBCUTANEOUS | Status: DC
  Administered 2017-12-18 – 2017-12-20 (×6): 4 [IU] via SUBCUTANEOUS

## 2017-12-18 MED ORDER — INSULIN ASPART 100 UNIT/ML ~~LOC~~ SOLN
0.0000 [IU] | Freq: Three times a day (TID) | SUBCUTANEOUS | Status: DC
  Administered 2017-12-18 (×2): 11 [IU] via SUBCUTANEOUS
  Administered 2017-12-19: 2 [IU] via SUBCUTANEOUS
  Administered 2017-12-19 – 2017-12-20 (×3): 3 [IU] via SUBCUTANEOUS

## 2017-12-18 MED ORDER — INSULIN ASPART PROT & ASPART (70-30 MIX) 100 UNIT/ML ~~LOC~~ SUSP
50.0000 [IU] | Freq: Two times a day (BID) | SUBCUTANEOUS | Status: DC
  Administered 2017-12-18 – 2017-12-20 (×4): 50 [IU] via SUBCUTANEOUS
  Filled 2017-12-18: qty 10

## 2017-12-18 MED ORDER — INSULIN ASPART 100 UNIT/ML ~~LOC~~ SOLN
5.0000 [IU] | Freq: Once | SUBCUTANEOUS | Status: AC
  Administered 2017-12-18: 5 [IU] via SUBCUTANEOUS

## 2017-12-18 MED ORDER — METHYLPREDNISOLONE SODIUM SUCC 40 MG IJ SOLR
40.0000 mg | Freq: Three times a day (TID) | INTRAMUSCULAR | Status: DC
  Administered 2017-12-18 – 2017-12-19 (×5): 40 mg via INTRAVENOUS
  Filled 2017-12-18 (×5): qty 1

## 2017-12-18 NOTE — Progress Notes (Signed)
Patient transferred from ED to [phone]. Patient A&Ox4. Telemetry box 13 applied and second verified by 2 RNs. Skin assessment completed by 2 RNs. Patient instructed how to use the callbell before getting out of bed. Callbell within reach. Admission handbook given to patient. Will continue to monitor and treat per MD orders.

## 2017-12-18 NOTE — Consult Note (Signed)
Kathy Irwin  LYH:[id number] DOB: 1950-08-11 DOA: 12/17/2017 PCP: System, Provider Not In    LOS: 1 day   Reason for Consult / Chief Complaint:  Dyspnea.  Possible hypersensitivity pneumonitis based on chest CT.  Consulting MD and date of consult:  Apolonio Schneiders TRH.  HPI/summary of hospital stay:  The patient is a 67 year old obese black woman who presented to the ED with shortness of breath and wheezing.   Over the recent past and more particularly over the week prior to admission she has reported episodes of chest tightness and difficulty breathing in response to strong smells including smoke, perfumes, newspaper ink.  She is SOB walking < 1 block. Denies clear orthopnea or PND.  No worsening of her reflux symptoms. Cough is generally non-productive but can produce clear sputum.  She was recently treated with bronchodilators and a short course of steroids which helped transiently.   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 She doesn't smoke and has no pets or farm exposures.  Subjective  Presently admitted to hospital. She is not wheezing, but continues to feel short of breath.  Assessment & Plan:   1. Subacute dyspnea with non-specific alveolar ground glass opacification on CT.   While not classic, this could be seen with  Hypersensitivity pneumonitis. However the story is most consistent with reactive airways in response to environmental exposure rather than true HP. Given that there are real downsides to prolonged steroids for the woman in terms of glycemic control, would be preferable to make as firm a diagnosis as possible.  Possible reactive airways disease, so a short course of steroids and bronchodilators is warranted.  It would be ideal if these were stopped prior to her outpatient PFTs and CT scan so that we may have as clear view as possible of her baseline condition.  2. Obesity related lung disease.   Certainly this a contributing factor in  this patient's dyspnea. Weight loss was recommended.  3. GERD with chronic aspiration.   This is a possibility but CT scan again not classic for this.  However, there is little downside to a trial of proton pump inhibitor.  Disposition / Summary of Today's Plan 12/18/17     CBC with differential to rule out eosinophilia.   Echocardiogram to rule out severe diastolic dysfunction which would warrant trial of diuresis.   Short course of steroids for reactive airways.  PFT's and HRCT chest in two weeks post discharge with outpatient consultation with [facility] (please schedule as new patient).  Would add Pantoprazole 27m bid to GERD regimen.  Consider adding probiotic to constipation regimen. May help with latter and help regular immune response.  Consultants: date of consult/date signed off (if applicable)/final recs   Pulmonary - 12/18/2017.  Procedures: None  Significant Diagnostic Tests:  CT chest 12/17/17:  IMPRESSION: 1. Despite the limitations of today's examination, there is no evidence to suggest clinically relevant central, lobar or segmental sized pulmonary embolism. 2. 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 Outpatient referral to pulmonology for further evaluation is recommended in the near future. Additionally, follow-up outpatient high-resolution chest CT should be considered. 3. Dilatation of the pulmonic trunk (3.9 cm in diameter), concerning for pulmonary arterial hypertension. 4. Aortic atherosclerosis. 5. There are calcifications of the aortic valve. Echocardiographic correlation for evaluation of potential valvular dysfunction may be warranted  if clinically indicated. 6. 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 Further evaluation with nonemergent MRI of the abdomen with and  without IV gadolinium is suggested to exclude the possibility of underlying gallbladder mass.    Objective    Examination: General: Morbid obesity. No distress. HENT: Thyroid normal. No lymphadenopathy. No iritis, no oral sores. Lungs: Normal chest excursion. Normal vesicular breath sounds with no crackles or wheezing. No clubbing. Cardiovascular: JVP is not elevated. Apex normal with normal HS. Abdomen: Protuberant but soft. Extremities: trace edema with no swollen or painful joints. Neuro: No focal deficits. GU: No foley  Blood pressure 113/60, pulse (!) 101, temperature 98.4 F (36.9 C), resp. rate 20, height _0  (1.626 m), weight 119.3 kg, SpO2 94 %.        Intake/Output Summary (Last 24 hours) at 12/18/2017 1449 Last data filed at 12/18/2017 0900 Gross per 24 hour  Intake 1174.76 ml  Output -  Net 1174.76 ml   Filed Weights   12/17/17 1437  Weight: 119.3 kg     Labs    CBC: Recent Labs  Lab 12/17/17 1440 12/18/17 0330  WBC 5.7 5.9  HGB 13.6 12.9  HCT 43.6 41.4  MCV 97.1 95.8  PLT 76* 81*   Basic Metabolic Panel: Recent Labs  Lab 12/17/17 1440 12/18/17 0330  NA 139 138  K 4.1 4.3  CL 108 108  CO2 22 20*  GLUCOSE 202* 327*  BUN 6* 14  CREATININE 0.70 0.87  CALCIUM 9.7 9.6   GFR: Estimated Creatinine Clearance: 79.7 mL/min (by C-G formula based on SCr of 0.87 mg/dL). Recent Labs  Lab 12/17/17 1440 12/18/17 0330  WBC 5.7 5.9   Liver Function Tests: Recent Labs  Lab 12/18/17 0330  AST 23  ALT 18  ALKPHOS 67  BILITOT 1.3*  PROT 6.5  ALBUMIN 4.0   No results for input(s): LIPASE, AMYLASE in the last 168 hours. No results for input(s): AMMONIA in the last 168 hours. ABG No results found for: PHART, PCO2ART, PO2ART, HCO3, TCO2, ACIDBASEDEF, O2SAT  Coagulation Profile: No results for input(s): INR, PROTIME in the last 168 hours. Cardiac Enzymes: No results for input(s): CKTOTAL, CKMB, CKMBINDEX, TROPONINI in the last 168  hours. HbA1C: No results found for: HGBA1C CBG: Recent Labs  Lab 12/17/17 2340 12/18/17 0142 12/18/17 0422 12/18/17 0745 12/18/17 1235  GLUCAP 424* 332* 307* 275* 301*     Review of Systems:   Review of Systems  Constitutional: Negative for chills, diaphoresis, fever, malaise/fatigue and weight loss.  HENT: Negative.   Eyes: Negative.  Negative for redness.  Respiratory: Positive for cough, sputum production, shortness of breath and wheezing. Negative for hemoptysis.   Cardiovascular: Negative.   Gastrointestinal: Positive for heartburn.  Genitourinary: Negative.   Musculoskeletal: Negative.   Skin: Negative.   Neurological: Negative.   Endo/Heme/Allergies: Negative.   Psychiatric/Behavioral: Negative.      Past medical history  She,  has a past medical history of Diabetes mellitus without complication ([place]) and Hypertension.   Surgical History   History reviewed. No pertinent surgical history.   Social History   Social History   Socioeconomic History  . Marital status: Married    Spouse name: Not on file  . Number of children: Not on file  . Years of education: Not on file  . Highest education level: Not on file  Occupational History  . Not on file  Social Needs  . Financial resource strain: Not on file  .  Food insecurity:    Worry: Not on file    Inability: Not on file  . Transportation needs:    Medical: Not on file    Non-medical: Not on file  Tobacco Use  . Smoking status: Never Smoker  . Smokeless tobacco: Never Used  Substance and Sexual Activity  . Alcohol use: Not on file  . Drug use: Not on file  . Sexual activity: Not on file  Lifestyle  . Physical activity:    Days per week: Not on file    Minutes per session: Not on file  . Stress: Not on file  Relationships  . Social connections:    Talks on phone: Not on file    Gets together: Not on file    Attends religious service: Not on file    Active member of club or organization: Not on  file    Attends meetings of clubs or organizations: Not on file    Relationship status: Not on file  . Intimate partner violence:    Fear of current or ex partner: Not on file    Emotionally abused: Not on file    Physically abused: Not on file    Forced sexual activity: Not on file  Other Topics Concern  . Not on file  Social History Narrative  . Not on file  ,  reports that she has never smoked. She has never used smokeless tobacco.   Family history   There is no family history of lung disease.  Allergies Allergies  Allergen Reactions  . Other Shortness Of Breath and Other (See Comments)    Newspaper ink =  new chest pain, also  . Iodine Other (See Comments)    "Was a long time ago" (Reaction??)  . Merbromin Other (See Comments)    Mercurochrome- "Was a long time ago" (Reaction??)    Home meds  Prior to Admission medications   Medication Sig Start Date End Date Taking? Authorizing Provider  acetaminophen (TYLENOL) 500 MG tablet Take 1,000 mg by mouth 2 (two) times daily as needed (for pain).   Yes [provider]  albuterol (PROAIR HFA) 108 (90 Base) MCG/ACT inhaler Inhale 2 puffs into the lungs every 6 (six) hours as needed for wheezing or shortness of breath.   Yes [provider]  albuterol (PROVENTIL) (2.5 MG/3ML) 0.083% nebulizer solution Take 2.5 mg by nebulization every 6 (six) hours as needed for wheezing or shortness of breath.   Yes [provider]  atorvastatin (LIPITOR) 20 MG tablet Take 20 mg by mouth at bedtime.    Yes [provider]  Azelastine-Fluticasone (DYMISTA NA) Place 1-2 sprays into both nostrils daily as needed (for allergies).    Yes [provider]  glimepiride (AMARYL) 2 MG tablet Take 2 mg by mouth 2 (two) times daily.    Yes [provider]  Insulin Lispro Prot & Lispro (HUMALOG MIX 75/25 KWIKPEN) (75-25) 100 UNIT/ML Kwikpen Inject 40 Units into the skin See admin instructions. Inject 40 units  into the skin before breakfast "and a second dose at bedtime if BGL is still high"   Yes [provider]  loratadine (CLARITIN) 10 MG tablet Take 10 mg by mouth daily as needed for allergies or rhinitis.    Yes [provider]  metFORMIN (GLUCOPHAGE) 1000 MG tablet Take 1,000 mg by mouth See admin instructions. Take 1,000 mg by mouth in the morning with breakfast "and a second dose of 1,000 mg daily if BGL is  still elevated"   Yes [provider]  montelukast (SINGULAIR) 10 MG tablet Take 10 mg by mouth at bedtime as needed (for seasonal allergies).    Yes [provider]  olmesartan (BENICAR) 40 MG tablet Take 40 mg by mouth daily.   Yes [provider]  oxybutynin (DITROPAN-XL) 10 MG 24 hr tablet Take 10 mg by mouth daily as needed (for symptoms of bladder instability).    Yes [provider]  polyethylene glycol powder (MIRALAX) powder Take 17 g by mouth daily.    Yes [provider]  ranitidine (ZANTAC) 150 MG tablet Take 150 mg by mouth at bedtime.    Yes [provider]  traMADol-acetaminophen (ULTRACET) 37.5-325 MG tablet Take 1 tablet by mouth every 6 (six) hours as needed (for pain).    Yes [provider]  Vitamin D, Ergocalciferol, (DRISDOL) 50000 units CAPS capsule Take 50,000 Units by mouth every Thursday.   Yes [provider]    Patient was seen and examined personally and I reviewed the patient's chart including relevant laboratory investigations. I personally reviewed and interpreted the patient's recent chest radiology and discussed the case with Dr Lamonte Sakai in order to come up with an appropriate outpatient plan of care. My recommendations were communicated verbally to Dr Denton Brick.   Kipp Brood, MD [facility] ICU Physician [place]  Pager: [phone] Mobile: [phone] After hours: [phone].

## 2017-12-18 NOTE — Progress Notes (Signed)
Patient Demographics:    Kathy Irwin, is a 67 y.o. female, DOB - [birth year], FEX:[id number]  Admit date - 12/17/2017   Admitting Physician Elwyn Reach, MD  Outpatient Primary MD for the patient is System, Provider Not In  LOS - 1   Chief Complaint  Patient presents with  . Shortness of Breath        Subjective:    [name] today has no fevers, no emesis,  No chest pain,   significant dyspnea and wheezing even at rest persists, continues to require oxygen  Assessment  & Plan :    Principal Problem:   Hypoxia Active Problems:   Pneumonitis   DM (diabetes mellitus) (HCC)   HTN (hypertension)  Brief Summary:- 67 y.o. female with medical history significant of Diabetes, HTN, Hyperlipidemia, Morbid obesity and Bronchospasm who has noticed recurrent SOB and wheezing whenever she is smells strong scents.  She also noticed that any smoke or perfume that is strong can trigger attack, admitted on 12/17/2017 with wheezing and hypoxia   Plan:- 1) Acute hypoxic respiratory failure----most likely due to ILD/hypersensitivity pneumonitis, there is concern about pulmonary hypertension, echocardiogram pending to rule out cardiac etiology of hypoxia and to evaluate pulmonary artery pressure.  Get pulmonology consult to help manage underlying respiratory disorder. CTA negative for PE  2)DM2- ???  Poor control, A1c not available, hyperglycemia is worse due to steroids,  metformin on hold due to contrast study, may restart metformin on 12/20/2017, stop Amaryl, change 70/30 insulin to 50 units twice daily, use moderate dose sliding scale insulin  3)HTN--stable, he reports 300 mg daily,  Disposition/Need for in-Hospital Stay- patient unable to be discharged at this time due to significant respiratory symptoms with significant wheezing, hypoxia and dry cough, will try to wean off oxygen supplementation, pulmonology  consult pending, echo pending.  Also hyperglycemia will get worse with high-dose steroids anticipate patient is at risk for DKA if not properly managed  Code Status : FULL   Disposition Plan  : home (she is visiting here from  [place])  Consults  :  PCCM   DVT Prophylaxis  :  Lovenox    Lab Results  Component Value Date   PLT 81 (L) 12/18/2017   Inpatient Medications  Scheduled Meds: . atorvastatin  20 mg Oral Daily  . docusate sodium  100 mg Oral BID  . enoxaparin (LOVENOX) injection  40 mg Subcutaneous Q24H  . famotidine  20 mg Oral BID  . insulin aspart  0-15 Units Subcutaneous TID WC  . insulin aspart  0-5 Units Subcutaneous QHS  . insulin aspart  4 Units Subcutaneous TID WC  . insulin aspart protamine- aspart  50 Units Subcutaneous BID WC  . irbesartan  300 mg Oral Daily  . loratadine  10 mg Oral Daily  . [START ON 12/20/2017] metFORMIN  1,000 mg Oral BID WC  . methylPREDNISolone (SOLU-MEDROL) injection  40 mg Intravenous Q8H  . montelukast  10 mg Oral QHS  . oxybutynin  10 mg Oral QHS  . [START ON 12/20/2017] Vitamin D (Ergocalciferol)  50,000 Units Oral Q Thu   Continuous Infusions: . sodium chloride 100 mL/hr at 12/18/17 0604   PRN Meds:.benzonatate, ipratropium-albuterol, ondansetron **OR** ondansetron (ZOFRAN) IV, traMADol-acetaminophen  Anti-infectives (From admission, onward)   None        Objective:   Vitals:   12/17/17 1900 12/17/17 2019 12/17/17 2041 12/18/17 0425  BP: 130/89 132/85  113/60  Pulse: (!) 122 (!) 115  (!) 101  Resp: 17   20  Temp:  97.8 F (36.6 C)  98.4 F (36.9 C)  TempSrc:  Oral    SpO2: 97% 100% 100% 94%  Weight:      Height:        Wt Readings from Last 3 Encounters:  12/17/17 119.3 kg     Intake/Output Summary (Last 24 hours) at 12/18/2017 1201 Last data filed at 12/18/2017 0900 Gross per 24 hour  Intake 1174.76 ml  Output -  Net 1174.76 ml     Physical Exam Patient is examined daily including today on  12/18/17   Gen:- Awake Alert, morbidly obese, able to speak in short sentences, coughing spells HEENT:- [place].AT, No sclera icterus Neck-Supple Neck,No JVD,.  Nose-  at 2 L/min Lungs-diminished in bases, scattered wheezes,  CV- S1, S2 normal Abd-  +ve B.Sounds, Abd Soft, No tenderness, increased truncal adiposity Extremity/Skin:-1-2+ pitting edema, negative Homans, pulses are good Psych-affect is appropriate, oriented x3 Neuro-no new focal deficits, no tremors   Data Review:   Micro Results No results found for this or any previous visit (from the past 240 hour(s)).  Radiology Reports Dg Chest 2 View  Result Date: 12/17/2017 CLINICAL DATA:  Chronic shortness of breath and wheezing. EXAM: CHEST - 2 VIEW COMPARISON:  None. FINDINGS: The heart size is normal. Mild fullness of the bilateral hila. Normal pulmonary vascularity. Mid to lower lung predominant increased interstitial markings with peribronchial thickening. No focal consolidation, pleural effusion, or pneumothorax. No acute osseous abnormality. IMPRESSION: Bilateral mid to lower lung predominant interstitial thickening concerning for interstitial pneumonitis secondary to an infectious or inflammatory etiology. Given chronic symptoms, further evaluation with high-resolution chest CT may be beneficial. Electronically Signed   By: Titus Dubin M.D.   On: 12/17/2017 13:44   Ct Angio Chest Pe W/cm &/or Wo Cm  Result Date: 12/17/2017 CLINICAL DATA:  67 year old female with history of shortness of breath and chest pain. EXAM: CT ANGIOGRAPHY CHEST WITH CONTRAST TECHNIQUE: Multidetector CT imaging of the chest was performed using the standard protocol during bolus administration of intravenous contrast. Multiplanar CT image reconstructions and MIPs were obtained to evaluate the vascular anatomy. CONTRAST:  135m ISOVUE-370 IOPAMIDOL (ISOVUE-370) INJECTION 76% COMPARISON:  None. FINDINGS: Comment: Study is slightly limited by patient  respiratory motion, which excludes accurate evaluation for small subsegmental sized pulmonary emboli. Cardiovascular: No filling defects within the central, lobar or segmental sized pulmonary arterial tree to suggest clinically relevant pulmonary embolism. Smaller subsegmental sized emboli cannot be entirely excluded secondary to respiratory motion. Pulmonic trunk is dilated measuring 3.9 cm in diameter. Heart size is borderline enlarged. There is no significant pericardial fluid, thickening or pericardial calcification. Mild atherosclerosis in the thoracic aorta. No coronary artery calcifications. Mild calcifications of the aortic valve. Mediastinum/Nodes: No pathologically enlarged mediastinal or hilar lymph nodes. Esophagus is unremarkable in appearance. No axillary lymphadenopathy. Lungs/Pleura: Widespread patchy areas of ground-glass attenuation, septal thickening and thickening of the peribronchovascular interstitium noted throughout the lungs bilaterally, with interspersed areas of lucency. No honeycombing identified. No pleural effusions. Scattered areas of mild cylindrical traction bronchiectasis, most evident in the right lower lobe. No definite suspicious appearing pulmonary nodules or masses. 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 Possible calcified gallstone in the neck of the gallbladder measuring 16 mm. Musculoskeletal: There are no aggressive appearing lytic or blastic lesions noted in the visualized portions of the skeleton. Review of the MIP images confirms the above findings. IMPRESSION: 1. Despite the limitations of today's examination, there is no evidence to suggest clinically relevant central, lobar or segmental sized pulmonary embolism. 2. 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 Outpatient referral to  pulmonology for further evaluation is recommended in the near future. Additionally, follow-up outpatient high-resolution chest CT should be considered. 3. Dilatation of the pulmonic trunk (3.9 cm in diameter), concerning for pulmonary arterial hypertension. 4. Aortic atherosclerosis. 5. There are calcifications of the aortic valve. Echocardiographic correlation for evaluation of potential valvular dysfunction may be warranted if clinically indicated. 6. 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 Further evaluation with nonemergent MRI of the abdomen with and without IV gadolinium is suggested to exclude the possibility of underlying gallbladder mass. Aortic Atherosclerosis (ICD10-I70.0). Electronically Signed   By: Vinnie Langton M.D.   On: 12/17/2017 17:26     CBC Recent Labs  Lab 12/17/17 1440 12/18/17 0330  WBC 5.7 5.9  HGB 13.6 12.9  HCT 43.6 41.4  PLT 76* 81*  MCV 97.1 95.8  MCH 30.3 29.9  MCHC 31.2 31.2  RDW 21.1* 21.1*    Chemistries  Recent Labs  Lab 12/17/17 1440 12/18/17 0330  NA 139 138  K 4.1 4.3  CL 108 108  CO2 22 20*  GLUCOSE 202* 327*  BUN 6* 14  CREATININE 0.70 0.87  CALCIUM 9.7 9.6  AST  --  23  ALT  --  18  ALKPHOS  --  67  BILITOT  --  1.3*   ------------------------------------------------------------------------------------------------------------------ No results for input(s): CHOL, HDL, LDLCALC, TRIG, CHOLHDL, LDLDIRECT in the last 72 hours.  No results found for: HGBA1C ------------------------------------------------------------------------------------------------------------------ No results for input(s): TSH, T4TOTAL, T3FREE, THYROIDAB in the last 72 hours.  Invalid input(s): FREET3 ------------------------------------------------------------------------------------------------------------------ No results for input(s): VITAMINB12, FOLATE, FERRITIN, TIBC, IRON, RETICCTPCT in the last 72  hours.  Coagulation profile No results for input(s): INR, PROTIME in the last 168 hours.  No results for input(s): DDIMER in the last 72 hours.  Cardiac Enzymes No results for input(s): CKMB, TROPONINI, MYOGLOBIN in the last 168 hours.  Invalid input(s): CK ------------------------------------------------------------------------------------------------------------------    Component Value Date/Time   BNP 9.1 12/17/2017 1440     Arsh Feutz M.D on 12/18/2017 at 12:01 PM  Pager---[phone] Go to [email or web address] - password [email or web address] for contact info  [facility] - Office  [phone]

## 2017-12-18 NOTE — Progress Notes (Signed)
Spoke with Mickel Baas RN regarding IV site. Patient is declining new IV placement at this time. Instructed patient that d/t IV in RAC that IV is not infusing d/t bending of the elbow. If patient is able to keep arm straight she may not need to have new IV placed. Instructed nurse Mickel Baas RN to place pillow under R arm to help with elbow position when IV fluids restarted and notify IV team if new IV site is still needed and patient willing to have new access placed. Mickel Baas RN VU. Fran Lowes, RN VAST

## 2017-12-19 ENCOUNTER — Encounter (HOSPITAL_COMMUNITY): Payer: Self-pay | Admitting: Family Medicine

## 2017-12-19 ENCOUNTER — Inpatient Hospital Stay (HOSPITAL_COMMUNITY): Payer: Medicare Other

## 2017-12-19 DIAGNOSIS — J9601 Acute respiratory failure with hypoxia: Secondary | ICD-10-CM

## 2017-12-19 DIAGNOSIS — D696 Thrombocytopenia, unspecified: Secondary | ICD-10-CM

## 2017-12-19 DIAGNOSIS — J9621 Acute and chronic respiratory failure with hypoxia: Secondary | ICD-10-CM

## 2017-12-19 DIAGNOSIS — I700 Atherosclerosis of aorta: Secondary | ICD-10-CM

## 2017-12-19 DIAGNOSIS — J9600 Acute respiratory failure, unspecified whether with hypoxia or hypercapnia: Secondary | ICD-10-CM

## 2017-12-19 DIAGNOSIS — I10 Essential (primary) hypertension: Secondary | ICD-10-CM

## 2017-12-19 DIAGNOSIS — J679 Hypersensitivity pneumonitis due to unspecified organic dust: Secondary | ICD-10-CM

## 2017-12-19 DIAGNOSIS — J189 Pneumonia, unspecified organism: Secondary | ICD-10-CM

## 2017-12-19 DIAGNOSIS — E119 Type 2 diabetes mellitus without complications: Secondary | ICD-10-CM

## 2017-12-19 HISTORY — DX: Hypersensitivity pneumonitis due to unspecified organic dust: J67.9

## 2017-12-19 LAB — ECHOCARDIOGRAM COMPLETE
Height: 64 in
Weight: 4208 oz

## 2017-12-19 LAB — GLUCOSE, CAPILLARY
Glucose-Capillary: 182 mg/dL — ABNORMAL HIGH (ref 70–99)
Glucose-Capillary: 167 mg/dL — ABNORMAL HIGH (ref 70–99)
Glucose-Capillary: 136 mg/dL — ABNORMAL HIGH (ref 70–99)
Glucose-Capillary: 286 mg/dL — ABNORMAL HIGH (ref 70–99)

## 2017-12-19 MED ORDER — FUROSEMIDE 40 MG PO TABS: 40.0000 mg | ORAL_TABLET | Freq: Every day | ORAL | Status: DC

## 2017-12-19 MED ORDER — METHYLPREDNISOLONE SODIUM SUCC 40 MG IJ SOLR
40.0000 mg | Freq: Every day | INTRAMUSCULAR | Status: DC
  Administered 2017-12-20: 40 mg via INTRAVENOUS
  Filled 2017-12-19: qty 1

## 2017-12-19 MED ORDER — FUROSEMIDE 40 MG PO TABS
40.0000 mg | ORAL_TABLET | Freq: Every day | ORAL | Status: DC
  Administered 2017-12-20: 40 mg via ORAL
  Filled 2017-12-19: qty 1

## 2017-12-19 MED ORDER — FLUCONAZOLE 150 MG PO TABS
150.0000 mg | ORAL_TABLET | Freq: Once | ORAL | Status: AC
  Administered 2017-12-19: 150 mg via ORAL
  Filled 2017-12-19: qty 1

## 2017-12-19 NOTE — Progress Notes (Signed)
PROGRESS NOTE  Kathy Irwin VPX:[id number] DOB: 1951/02/17 DOA: 12/17/2017 PCP: System, Provider Not In  Brief Narrative: 67 year old woman from [place] presented with shortness of breath.  Admitted for acute hypoxia.  Assessment/Plan Acute hypoxic respiratory failure with nonspecific alveolar groundglass opacification on CT.  Suggestive of hypersensitivity pneumonitis.  Also suggested pulmonary artery hypertension.  Etiology unclear.  Possible hypersensitivity pneumonitis versus reactive airway disease. --Pulmonology recommend short course of steroids, bronchodilators, echocardiogram, follow-up 2 weeks as an outpatient with pulmonology Dr. Lamonte Sakai; Protonix 40 mg twice daily  DM type 2. --stable. Continue insulin 70/30, meal coverage and SSI. Hold oral meds until discharge.   Essential HTN --stable. Continue irbesartan.  Thrombocytopenia --stable. F/u as an outpatient.   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 Further evaluation with nonemergent MRI of the abdomen with and without IV gadolinium is suggested to exclude the possibility of underlying gallbladder mass.  Morbid obesity. Body mass index is 45.14 kg/m. --dietician consult  Aortic atherosclerosis  --f/u as an outpatient   Assess for home oxygen need  Treat yeast infection  Trial diuretic  DVT prophylaxis: enoxaparin Code Status: full Family Communication: sister at bedside  Disposition Plan: home with sister    Murray Hodgkins, MD  [facility] Direct contact: [phone] --[address] [place]  --[email or web address]; password [email or web address]  7PM-7AM contact night coverage as above 12/19/2017, 8:00 PM  LOS: 2 days   Consultants:  Pulmonology   Procedures:  Echo Study Conclusions  - Left ventricle: The cavity size was normal. Wall thickness was   increased in a pattern of mild LVH. Systolic function was normal.   The estimated ejection fraction was in the  range of 60% to 65%.   Wall motion was normal; there were no regional wall motion   abnormalities. Doppler parameters are consistent with abnormal   left ventricular relaxation (grade 1 diastolic dysfunction). - Aortic valve: Valve area (VTI): 2.4 cm^2. Valve area (Vmax): 2.22   cm^2. Valve area (Vmean): 2.4 cm^2.  Antimicrobials:    Interval history/Subjective: Feels ok sitting; but gets SOB with walking to bathroom and sats drop.  Objective: Vitals:  Vitals:   12/19/17 0641 12/19/17 1550  BP: (!) 117/58 (!) 144/80  Pulse: 76 75  Resp: (!) 21 18  Temp: 97.8 F (36.6 C) 98.4 F (36.9 C)  SpO2: 96% 96%    Exam:  Constitutional:  . Appears calm and comfortable sitting in chair Respiratory:  . CTA bilaterally, no w/r/r.  . Respiratory effort normal.  Cardiovascular:  . RRR, no m/r/g . 1+ bilateral LE extremity edema   Psychiatric:  . Mental status o Mood, affect appropriate  I have personally reviewed the following:   Labs:  Basic metabolic panel, hepatic function panel unremarkable  Hemoglobin, WBC within normal limits.  Platelets 81, stable.  Imaging studies:  CT chest and CXR noted  Medical tests:  EKG ST   Scheduled Meds: . atorvastatin  20 mg Oral Daily  . clotrimazole  1 Applicatorful Vaginal QHS  . docusate sodium  100 mg Oral BID  . enoxaparin (LOVENOX) injection  40 mg Subcutaneous Q24H  . famotidine  20 mg Oral BID  . insulin aspart  0-15 Units Subcutaneous TID WC  . insulin aspart  0-5 Units Subcutaneous QHS  . insulin aspart  4 Units Subcutaneous TID WC  . insulin aspart protamine- aspart  50 Units Subcutaneous BID WC  . irbesartan  300 mg Oral Daily  . loratadine  10 mg  Oral Daily  . [START ON 12/20/2017] methylPREDNISolone (SOLU-MEDROL) injection  40 mg Intravenous Daily  . montelukast  10 mg Oral QHS  . oxybutynin  10 mg Oral QHS  . [START ON 12/20/2017] Vitamin D (Ergocalciferol)  50,000 Units Oral Q Thu   Continuous Infusions: .  sodium chloride 100 mL/hr at 12/18/17 1505    Principal Problem:   Acute respiratory failure with hypoxia (HCC) Active Problems:   Pneumonitis   DM (diabetes mellitus) (HCC)   HTN (hypertension)   Hypersensitivity pneumonitis (HCC)   Thrombocytopenia (HCC)   LOS: 2 days

## 2017-12-19 NOTE — Progress Notes (Addendum)
Kathy Irwin  GRM:[id number] DOB: [birth year] DOA: 12/17/2017 PCP: System, Provider Not In    LOS: 2 days   Reason for Consult / Chief Complaint:  Dyspnea.  Possible hypersensitivity pneumonitis based on chest CT.  Consulting MD and date of consult:  Apolonio Schneiders TRH.  HPI/summary of hospital stay:  The patient is a 68 year old obese black woman who presented to the ED with shortness of breath and wheezing.   Over the recent past and more particularly over the week prior to admission she has reported episodes of chest tightness and difficulty breathing in response to strong smells including smoke, perfumes, newspaper ink.  She is SOB walking < 1 block. Denies clear orthopnea or PND.  No worsening of her reflux symptoms. Cough is generally non-productive but can produce clear sputum.  She was recently treated with bronchodilators and a short course of steroids which helped transiently.   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 She doesn't smoke and has no pets or farm exposures.  Subjective  Presently admitted to hospital. She is not wheezing, but continues to feel short of breath.  Assessment & Plan:   1. Subacute dyspnea with non-specific alveolar ground glass opacification on CT.   Questional hypersensitive pneumonitis versus possible reactive airway disease  Questionable if trial of diuresis would help?  Follow-up with pulmonary specialist in the near future.  Note she is from [place] question whether she will follow-up with a local pulmonologist or pulmonologist in her hometown.  She remains in [facility] and we can certainly arrange a follow-up appoint with our pulmonary office.  Dr. Baltazar Apo will be the pulmonologist of choice in his case.  2. Obesity related lung disease.   Weight loss recommended  3. GERD with chronic aspiration.   Trial of twice daily proton pump inhibitors.  Disposition / Summary of Today's Plan  12/19/17   Currently had a 2D echo performed will need to evaluate results Steroids have been initiated Currently on oxygen Follow-up with pulmonologist in the future  Consultants: date of consult/date signed off (if applicable)/final recs   Pulmonary - 12/18/2017.  Procedures: None  Significant Diagnostic Tests:  CT chest 12/17/17:  IMPRESSION: 1. Despite the limitations of today's examination, there is no evidence to suggest clinically relevant central, lobar or segmental sized pulmonary embolism. 2. 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 Outpatient referral to pulmonology for further evaluation is recommended in the near future. Additionally, follow-up outpatient high-resolution chest CT should be considered. 3. Dilatation of the pulmonic trunk (3.9 cm in diameter), concerning for pulmonary arterial hypertension. 4. Aortic atherosclerosis. 5. There are calcifications of the aortic valve. Echocardiographic correlation for evaluation of potential valvular dysfunction may be warranted if clinically indicated. 6. 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 Further evaluation with nonemergent MRI of the abdomen with and without IV gadolinium is suggested to exclude the possibility of underlying gallbladder mass.    Objective    Examination: General: Morbidly obese female currently on room air no acute distress HEENT: No JVD lymphadenopathy is appreciated Neuro: Awake alert no neurological deficits noted CV: Heart sounds are regular regular rate and rhythm PULM: even/non-labored, lungs bilaterally diminished SP:JSUN, non-tender, bsx4 active  Extremities: warm/dry, 1+ edema  Skin: no rashes or lesions   Blood pressure (!) 117/58, pulse 76, temperature  97.8 F (36.6 C), temperature source Oral, resp.  rate (!) 21, height 5' 4" (1.626 m), weight 119.3 kg, SpO2 96 %.        Intake/Output Summary (Last 24 hours) at 12/19/2017 1208 Last data filed at 12/19/2017 0400 Gross per 24 hour  Intake 1594.97 ml  Output -  Net 1594.97 ml   Filed Weights   12/17/17 1437  Weight: 119.3 kg     Labs    CBC: Recent Labs  Lab 12/17/17 1440 12/18/17 0330  WBC 5.7 5.9  HGB 13.6 12.9  HCT 43.6 41.4  MCV 97.1 95.8  PLT 76* 81*   Basic Metabolic Panel: Recent Labs  Lab 12/17/17 1440 12/18/17 0330  NA 139 138  K 4.1 4.3  CL 108 108  CO2 22 20*  GLUCOSE 202* 327*  BUN 6* 14  CREATININE 0.70 0.87  CALCIUM 9.7 9.6   GFR: Estimated Creatinine Clearance: 79.7 mL/min (by C-G formula based on SCr of 0.87 mg/dL). Recent Labs  Lab 12/17/17 1440 12/18/17 0330  WBC 5.7 5.9   Liver Function Tests: Recent Labs  Lab 12/18/17 0330  AST 23  ALT 18  ALKPHOS 67  BILITOT 1.3*  PROT 6.5  ALBUMIN 4.0   No results for input(s): LIPASE, AMYLASE in the last 168 hours. No results for input(s): AMMONIA in the last 168 hours. ABG No results found for: PHART, PCO2ART, PO2ART, HCO3, TCO2, ACIDBASEDEF, O2SAT  Coagulation Profile: No results for input(s): INR, PROTIME in the last 168 hours. Cardiac Enzymes: No results for input(s): CKTOTAL, CKMB, CKMBINDEX, TROPONINI in the last 168 hours. HbA1C: No results found for: HGBA1C CBG: Recent Labs  Lab 12/18/17 0745 12/18/17 1235 12/18/17 1633 12/18/17 2152 12/19/17 0822  GLUCAP 275* 301* 307* 253* 182*    Steve Janette Harvie ACNP [facility] PCC[facility] Pager [phone] till 1 pm If no answer page [phone] 12/19/2017, 12:08 PM

## 2017-12-19 NOTE — Progress Notes (Signed)
  Echocardiogram 2D Echocardiogram has been performed.  Kathy Irwin 12/19/2017, 12:14 PM

## 2017-12-20 LAB — GLUCOSE, CAPILLARY
Glucose-Capillary: 77 mg/dL (ref 70–99)
Glucose-Capillary: 134 mg/dL — ABNORMAL HIGH (ref 70–99)
Glucose-Capillary: 188 mg/dL — ABNORMAL HIGH (ref 70–99)

## 2017-12-20 MED ORDER — PREDNISONE 20 MG PO TABS: 40.0000 mg | ORAL_TABLET | Freq: Every day | ORAL | 0 refills | Status: DC

## 2017-12-20 MED ORDER — ALBUTEROL SULFATE (2.5 MG/3ML) 0.083% IN NEBU: 2.5000 mg | INHALATION_SOLUTION | Freq: Four times a day (QID) | RESPIRATORY_TRACT | 0 refills | Status: DC | PRN

## 2017-12-20 MED ORDER — FLUCONAZOLE 150 MG PO TABS: 150.0000 mg | ORAL_TABLET | Freq: Once | ORAL | 0 refills | Status: AC

## 2017-12-20 NOTE — Discharge Summary (Signed)
Physician Discharge Summary  Kathy Irwin SWF:[id number] DOB: 1951/03/30 DOA: 12/17/2017  PCP: System, Provider Not In  Admit date: 12/17/2017 Discharge date: 12/20/2017  Recommendations for Outpatient Follow-up:   Acute hypoxic respiratory failure with nonspecific alveolar groundglass opacification on CT.  Suggestive of hypersensitivity pneumonitis.  Also suggested pulmonary artery hypertension.  Etiology unclear.  Possible hypersensitivity pneumonitis versus reactive airway disease. --Pulmonology recommended short course of steroids, bronchodilators, echocardiogram (done), follow-up 2 weeks as an outpatient with pulmonology Dr. Lamonte Sakai --refused home oxygen  Thrombocytopenia --remained stable. F/u as an outpatient.   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 Further evaluation with nonemergent MRI of the abdomen with and without IV gadolinium is suggested to exclude the possibility of underlying gallbladder mass. --f/u as an outpatient   Follow-up Information    Byrum, Rose Fillers, MD. Schedule an appointment as soon as possible for a visit in 3 week(s).   Specialty:  Pulmonary Disease Why:  Ensure CT and PFT's done beforehand. Contact information: [address] [place] [address] [phone]            Discharge Diagnoses:  1. Acute hypoxic respiratory failure with nonspecific alveolar groundglass opacification on CT 2. DM type 2. 3. Essential HTN 4. Thrombocytopenia 5. Abnormal appearance of the gallbladder on CT 6. Morbid obesity 7. Aortic atherosclerosis   Discharge Condition: improved Disposition: home  Diet recommendation: heart healthy  Filed Weights   12/17/17 1437  Weight: 119.3 kg    History of present illness:  67 year old woman from [place] presented with shortness of breath.  Admitted for acute hypoxia.  Hospital Course:  Patient seen by pulmonology and started on empiric steroids.  Condition  rapidly stabilized.  She has subjective improvement and hypoxia decreased.  After further evaluation, recommendation was for discharge home with outpatient follow-up with pulmonology, with which the patient agreed.  On the day of discharge, she was ambulated and did saturate down to 87%, oxygen was ordered but the patient refused this.  Individual issues as below.  Acute hypoxic respiratory failure with nonspecific alveolar groundglass opacification on CT.  Suggestive of hypersensitivity pneumonitis.  Also suggested pulmonary artery hypertension.  Etiology unclear.  Possible hypersensitivity pneumonitis versus reactive airway disease. --Pulmonology recommended short course of steroids, bronchodilators, echocardiogram (done), follow-up 2 weeks as an outpatient with pulmonology Dr. Lamonte Sakai  DM type 2. --Remained stable. Continue insulin 70/30, meal coverage and SSI. Hold oral meds until discharge.   Essential HTN --Remained stable. Continue irbesartan.  Thrombocytopenia --remained stable. F/u as an outpatient.   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 Further evaluation with nonemergent MRI of the abdomen with and without IV gadolinium is suggested to exclude the possibility of underlying gallbladder mass. --f/u as an outpatient  Morbid obesity. Body mass index is 45.14 kg/m. --dietician consult appreciated   Aortic atherosclerosis  --f/u as an outpatient  Consultants:  Pulmonology   Procedures:  Echo Study Conclusions  - Left ventricle: The cavity size was normal. Wall thickness was increased in a pattern of mild LVH. Systolic function was normal. The estimated ejection fraction was in the range of 60% to 65%. Wall motion was normal; there were no regional wall motion abnormalities. Doppler parameters are consistent with abnormal left ventricular relaxation (grade 1 diastolic dysfunction). - Aortic valve: Valve area  (VTI): 2.4 cm^2. Valve area (Vmax): 2.22 cm^2. Valve area (Vmean): 2.4 cm^2.  Today's assessment: S: feels better, breathing better, ambulated without SOB.  O:  Vitals:  Vitals:   12/20/17 0600 12/20/17 0845  BP: 117/69   Pulse: 90   Resp: 18   Temp: 98.4 F (36.9 C)   SpO2: 94% 95%    Constitutional:  . Appears calm and comfortable Respiratory:  . CTA bilaterally, no w/r/r.  . Respiratory effort normal.  Cardiovascular:  . RRR, no m/r/g . No LE extremity edema   Psychiatric:  . Mental status o Mood, affect appropriate  CBG stable  Discharge Instructions  Discharge Instructions    Diet - low sodium heart healthy   Complete by:  As directed    Diet Carb Modified   Complete by:  As directed    Discharge instructions   Complete by:  As directed    Call your physician or seek immediate medical attention for shortness of breath, confusion, passing out or worsening of condition.   Increase activity slowly   Complete by:  As directed      Allergies as of 12/20/2017      Reactions   Other Shortness Of Breath, Other (See Comments)   Newspaper ink =  new chest pain, also   Iodine Other (See Comments)   "Was a long time ago" (Reaction??)   Merbromin Other (See Comments)   Mercurochrome- "Was a long time ago" (Reaction??)      Medication List    TAKE these medications   acetaminophen 500 MG tablet Commonly known as:  TYLENOL Take 1,000 mg by mouth 2 (two) times daily as needed (for pain).   atorvastatin 20 MG tablet Commonly known as:  LIPITOR Take 20 mg by mouth at bedtime.   DYMISTA NA Place 1-2 sprays into both nostrils daily as needed (for allergies).   fluconazole 150 MG tablet Commonly known as:  DIFLUCAN Take 1 tablet (150 mg total) by mouth once for 1 dose.   glimepiride 2 MG tablet Commonly known as:  AMARYL Take 2 mg by mouth 2 (two) times daily.   HUMALOG MIX 75/25 KWIKPEN (75-25) 100 UNIT/ML Kwikpen Generic drug:  Insulin Lispro Prot &  Lispro Inject 40 Units into the skin See admin instructions. Inject 40 units into the skin before breakfast "and a second dose at bedtime if BGL is still high"   loratadine 10 MG tablet Commonly known as:  CLARITIN Take 10 mg by mouth daily as needed for allergies or rhinitis.   metFORMIN 1000 MG tablet Commonly known as:  GLUCOPHAGE Take 1,000 mg by mouth See admin instructions. Take 1,000 mg by mouth in the morning with breakfast "and a second dose of 1,000 mg daily if BGL is still elevated"   MIRALAX powder Generic drug:  polyethylene glycol powder Take 17 g by mouth daily.   montelukast 10 MG tablet Commonly known as:  SINGULAIR Take 10 mg by mouth at bedtime as needed (for seasonal allergies).   olmesartan 40 MG tablet Commonly known as:  BENICAR Take 40 mg by mouth daily.   oxybutynin 10 MG 24 hr tablet Commonly known as:  DITROPAN-XL Take 10 mg by mouth daily as needed (for symptoms of bladder instability).   predniSONE 20 MG tablet Commonly known as:  DELTASONE Take 2 tablets (40 mg total) by mouth daily.   PROAIR HFA 108 (90 Base) MCG/ACT inhaler Generic drug:  albuterol Inhale 2 puffs into the lungs every 6 (six) hours as needed for wheezing or shortness of breath.   albuterol (2.5 MG/3ML) 0.083% nebulizer solution Commonly known as:  PROVENTIL Take 3 mLs (2.5 mg  total) by nebulization every 6 (six) hours as needed for wheezing or shortness of breath.   ranitidine 150 MG tablet Commonly known as:  ZANTAC Take 150 mg by mouth at bedtime.   traMADol-acetaminophen 37.5-325 MG tablet Commonly known as:  ULTRACET Take 1 tablet by mouth every 6 (six) hours as needed (for pain).   Vitamin D (Ergocalciferol) 50000 units Caps capsule Commonly known as:  DRISDOL Take 50,000 Units by mouth every Thursday.      Allergies  Allergen Reactions  . Other Shortness Of Breath and Other (See Comments)    Newspaper ink =  new chest pain, also  . Iodine Other (See  Comments)    "Was a long time ago" (Reaction??)  . Merbromin Other (See Comments)    Mercurochrome- "Was a long time ago" (Reaction??)    The results of significant diagnostics from this hospitalization (including imaging, microbiology, ancillary and laboratory) are listed below for reference.    Significant Diagnostic Studies: Dg Chest 2 View  Result Date: 12/17/2017 CLINICAL DATA:  Chronic shortness of breath and wheezing. EXAM: CHEST - 2 VIEW COMPARISON:  None. FINDINGS: The heart size is normal. Mild fullness of the bilateral hila. Normal pulmonary vascularity. Mid to lower lung predominant increased interstitial markings with peribronchial thickening. No focal consolidation, pleural effusion, or pneumothorax. No acute osseous abnormality. IMPRESSION: Bilateral mid to lower lung predominant interstitial thickening concerning for interstitial pneumonitis secondary to an infectious or inflammatory etiology. Given chronic symptoms, further evaluation with high-resolution chest CT may be beneficial. Electronically Signed   By: Titus Dubin M.D.   On: 12/17/2017 13:44   Ct Angio Chest Pe W/cm &/or Wo Cm  Result Date: 12/17/2017 CLINICAL DATA:  67 year old female with history of shortness of breath and chest pain. EXAM: CT ANGIOGRAPHY CHEST WITH CONTRAST TECHNIQUE: Multidetector CT imaging of the chest was performed using the standard protocol during bolus administration of intravenous contrast. Multiplanar CT image reconstructions and MIPs were obtained to evaluate the vascular anatomy. CONTRAST:  127m ISOVUE-370 IOPAMIDOL (ISOVUE-370) INJECTION 76% COMPARISON:  None. FINDINGS: Comment: Study is slightly limited by patient respiratory motion, which excludes accurate evaluation for small subsegmental sized pulmonary emboli. Cardiovascular: No filling defects within the central, lobar or segmental sized pulmonary arterial tree to suggest clinically relevant pulmonary embolism. Smaller subsegmental  sized emboli cannot be entirely excluded secondary to respiratory motion. Pulmonic trunk is dilated measuring 3.9 cm in diameter. Heart size is borderline enlarged. There is no significant pericardial fluid, thickening or pericardial calcification. Mild atherosclerosis in the thoracic aorta. No coronary artery calcifications. Mild calcifications of the aortic valve. Mediastinum/Nodes: No pathologically enlarged mediastinal or hilar lymph nodes. Esophagus is unremarkable in appearance. No axillary lymphadenopathy. Lungs/Pleura: Widespread patchy areas of ground-glass attenuation, septal thickening and thickening of the peribronchovascular interstitium noted throughout the lungs bilaterally, with interspersed areas of lucency. No honeycombing identified. No pleural effusions. Scattered areas of mild cylindrical traction bronchiectasis, most evident in the right lower lobe. No definite suspicious appearing pulmonary nodules or masses. 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 Possible calcified gallstone in the neck of the gallbladder measuring 16 mm. Musculoskeletal: There are no aggressive appearing lytic or blastic lesions noted in the visualized portions of the skeleton. Review of the MIP images confirms the above findings. IMPRESSION: 1. Despite the limitations of today's examination, there is no evidence to suggest clinically relevant central, lobar or segmental sized pulmonary embolism. 2. Changes in the lungs suggestive of potential interstitial  lung disease, with a pattern that is nonspecific but favored to reflect chronic hypersensitivity pneumonitis. Outpatient referral to pulmonology for further evaluation is recommended in the near future. Additionally, follow-up outpatient high-resolution chest CT should be considered. 3. Dilatation of the pulmonic trunk (3.9 cm in diameter), concerning for pulmonary arterial hypertension. 4. Aortic  atherosclerosis. 5. There are calcifications of the aortic valve. Echocardiographic correlation for evaluation of potential valvular dysfunction may be warranted if clinically indicated. 6. 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 Further evaluation with nonemergent MRI of the abdomen with and without IV gadolinium is suggested to exclude the possibility of underlying gallbladder mass. Aortic Atherosclerosis (ICD10-I70.0). Electronically Signed   By: Vinnie Langton M.D.   On: 12/17/2017 17:26   Labs: Basic Metabolic Panel: Recent Labs  Lab 12/17/17 1440 12/18/17 0330  NA 139 138  K 4.1 4.3  CL 108 108  CO2 22 20*  GLUCOSE 202* 327*  BUN 6* 14  CREATININE 0.70 0.87  CALCIUM 9.7 9.6   Liver Function Tests: Recent Labs  Lab 12/18/17 0330  AST 23  ALT 18  ALKPHOS 67  BILITOT 1.3*  PROT 6.5  ALBUMIN 4.0   CBC: Recent Labs  Lab 12/17/17 1440 12/18/17 0330  WBC 5.7 5.9  HGB 13.6 12.9  HCT 43.6 41.4  MCV 97.1 95.8  PLT 76* 81*    Recent Labs    12/17/17 1440  BNP 9.1   CBG: Recent Labs  Lab 12/19/17 1757 12/19/17 2153 12/20/17 0809 12/20/17 1006 12/20/17 1225  GLUCAP 136* 286* 77 134* 188*    Principal Problem:   Acute respiratory failure with hypoxia (HCC) Active Problems:   Pneumonitis   DM (diabetes mellitus) ([place])   HTN (hypertension)   Hypersensitivity pneumonitis ([place])   Thrombocytopenia ([place])   Time coordinating discharge: 35 minutes  Signed:  Murray Hodgkins, MD [facility] 12/20/2017, 7:39 PM

## 2017-12-20 NOTE — Plan of Care (Signed)
  Problem: Education: Goal: Knowledge of General Education information will improve Description Including pain rating scale, medication(s)/side effects and non-pharmacologic comfort measures Outcome: Progressing   Problem: Health Behavior/Discharge Planning: Goal: Ability to manage health-related needs will improve Outcome: Progressing   Problem: Clinical Measurements: Goal: Ability to maintain clinical measurements within normal limits will improve Outcome: Progressing Goal: Will remain free from infection Outcome: Progressing Goal: Diagnostic test results will improve Outcome: Progressing Goal: Respiratory complications will improve Outcome: Progressing Patient weaned off of 2 liters oxygen via nasal canula.  She walked the unit and once she returned to her room she was noted at 88% on room air.  After relaxing or a minute she returned greater than 90%. She remained on room air through shift with no distress.  Goal: Cardiovascular complication will be avoided Outcome: Progressing   Problem: Activity: Goal: Risk for activity intolerance will decrease Outcome: Progressing   Problem: Nutrition: Goal: Adequate nutrition will be maintained Outcome: Progressing   Problem: Coping: Goal: Level of anxiety will decrease Outcome: Progressing   Problem: Elimination: Goal: Will not experience complications related to bowel motility Outcome: Progressing Goal: Will not experience complications related to urinary retention Outcome: Progressing   Problem: Pain Managment: Goal: General experience of comfort will improve Outcome: Progressing   Problem: Safety: Goal: Ability to remain free from injury will improve Outcome: Progressing   Problem: Skin Integrity: Goal: Risk for impaired skin integrity will decrease Outcome: Progressing

## 2017-12-20 NOTE — Progress Notes (Signed)
Nutrition Education Consult  RD consulted for nutrition education regarding weight loss. Patient reports multiple stressful life situations for the past year. She has been eating poorly because of all the stress. Patient is interested in losing weight.   Body mass index is 45.14 kg/m. Pt meets criteria for class 3, extreme/morbid obesity based on current BMI.  RD provided "Weight Loss Tips" handout from the Academy of Nutrition and Dietetics. Emphasized the importance of serving sizes and provided examples of correct portions of common foods. Discussed importance of controlled and consistent intake throughout the day. Provided examples of ways to balance meals/snacks and encouraged intake of high-fiber, whole grain complex carbohydrates. Emphasized the importance of hydration with calorie-free beverages and limiting sugar-sweetened beverages. Encouraged pt to discuss physical activity options with physician. Teach back method used.  Expect good compliance.  Current diet order is CHO modified, patient is consuming approximately 100% of meals at this time. Labs and medications reviewed. No further nutrition interventions warranted at this time. RD contact information provided. If additional nutrition issues arise, please re-consult RD.  Recommend outpatient diet education/counseling with a RD near her home in [facility].   Molli Barrows, RD, LDN, [place] Pager [phone] After Hours Pager [phone]

## 2017-12-20 NOTE — Care Management Note (Signed)
Case Management Note  Patient Details  Name: Kathy Irwin MRN: [id number] Date of Birth: May 15, 1950  Subjective/Objective:       Acute respiratory failure with hypoxia. From [place], here visiting sister. Independent with ADL's ,no DME usage prior to admit.           Everlene Other (sister) Mackey Birchwood (Daughter)      [phone]           PCP: [place]  Action/Plan: Transition to home. Per walking O2 qualifier pt needs home oxygen however, pt refuses. States will f/u with PCP in [facility].  Sister to provide transportation to home.  Expected Discharge Date:  12/20/17               Expected Discharge Plan:  Home/Self Care  In-House Referral:     Discharge planning Services  CM Consult  Post Acute Care Choice:    Choice offered to:   patient  DME Arranged:   N/A DME Agency:   N/A  HH Arranged:   N/A HH Agency:   N/A  Status of Service:  Completed, signed off  If discussed at [place] of Stay Meetings, dates discussed:    Additional Comments:  Sharin Mons, RN 12/20/2017, 1:15 PM

## 2017-12-20 NOTE — Progress Notes (Signed)
SATURATION QUALIFICATIONS: (This note is used to comply with regulatory documentation for home oxygen)  Patient Saturations on Room Air at Rest = 95%  Patient Saturations on Room Air while Ambulating 87%  While ambulating reminded patient to take deep breaths, Spo2 raised sightly, returned back to 95% when returning to room.

## 2017-12-20 NOTE — Progress Notes (Signed)
Kathy Irwin to be D/C'd to home per MD order.  Discussed with the patient and all questions fully answered.  VSS, Skin clean, dry and intact without evidence of skin break down, no evidence of skin tears noted. IV catheter discontinued intact. Site without signs and symptoms of complications. Dressing and pressure applied.  An After Visit Summary was printed and given to the patient. Patient received prescriptions.  D/c education completed with patient/family including follow up instructions, medication list, d/c activities limitations if indicated, with other d/c instructions as indicated by MD - patient able to verbalize understanding, all questions fully answered.   Patient instructed to return to ED, call 911, or call MD for any changes in condition.   Patient escorted via [place], and D/C home via private auto.  Morley Kos Price 12/20/2017 3:04 PM

## 2017-12-20 NOTE — Care Management Important Message (Signed)
Important Message  Patient Details  Name: Kathy Irwin MRN: [id number] Date of Birth: Jul 06, 1950   Medicare Important Message Given:  Yes    Nikitia Asbill 12/20/2017, 2:28 PM

## 2018-08-28 ENCOUNTER — Other Ambulatory Visit: Payer: Self-pay

## 2018-08-28 ENCOUNTER — Encounter (HOSPITAL_COMMUNITY): Payer: Self-pay | Admitting: Emergency Medicine

## 2018-08-28 ENCOUNTER — Ambulatory Visit (INDEPENDENT_AMBULATORY_CARE_PROVIDER_SITE_OTHER): Payer: Medicare Other

## 2018-08-28 ENCOUNTER — Ambulatory Visit (HOSPITAL_COMMUNITY)
Admission: EM | Admit: 2018-08-28 | Discharge: 2018-08-28 | Disposition: A | Discharge status: Home / Self Care | Payer: Medicare Other | Attending: Family Medicine | Admitting: Family Medicine

## 2018-08-28 DIAGNOSIS — M25512 Pain in left shoulder: Secondary | ICD-10-CM

## 2018-08-28 MED ORDER — TRAMADOL HCL 50 MG PO TABS: 50.0000 mg | ORAL_TABLET | Freq: Four times a day (QID) | ORAL | 0 refills | Status: DC | PRN

## 2018-08-28 NOTE — ED Triage Notes (Signed)
Patient fell last week, 5/6.  Patient slipped on hard wood floor at her home.  No loc.  Patient did not hit her head.  Patient has left shoulder pain, impacting range of motion and pain radiates into neck.  Patient has mid-back pain too

## 2018-08-28 NOTE — Discharge Instructions (Addendum)
Be aware, pain medications may cause drowsiness. Please do not drive, operate heavy machinery or make important decisions while on this medication, it can cloud your judgement.  You may use over the counter ibuprofen or acetaminophen as needed.

## 2018-09-04 NOTE — ED Provider Notes (Signed)
Kathy Irwin   [id number] 08/28/18 Arrival Time: 1700  ASSESSMENT & PLAN:  1. Acute pain of left shoulder    I have personally viewed the imaging studies ordered this visit. No fracture appreciated.  Imaging: Dg Shoulder Left  Result Date: 08/28/2018 CLINICAL DATA:  Fall 1 week ago.  Shoulder pain EXAM: LEFT SHOULDER - 2+ VIEW COMPARISON:  None. FINDINGS: Normal alignment no fracture. Degenerative change in spurring in the [facility] joint. IMPRESSION: Negative for fracture. Electronically Signed   By: Franchot Gallo M.D.   On: 08/28/2018 18:27   Ibuprofen with food.  Meds ordered this encounter  Medications  . traMADol (ULTRAM) 50 MG tablet    Sig: Take 1 tablet (50 mg total) by mouth every 6 (six) hours as needed.    Dispense:  15 tablet    Refill:  0    Orders Placed This Encounter  Procedures  . DG Shoulder Left    Follow-up Information    Schedule an appointment as soon as possible for a visit  with Rosemarie Ax, MD.   Specialties:  Family Medicine, Emergency Medicine Contact information: [place] [address] [phone]          [place] Controlled Substances Registry consulted for this patient. I feel the risk/benefit ratio today is favorable for proceeding with this prescription for a controlled substance. Medication sedation precautions given.  Reviewed expectations re: course of current medical issues. Questions answered. Outlined signs and symptoms indicating need for more acute intervention. Patient verbalized understanding. After Visit Summary given.  SUBJECTIVE: History from: patient. Kathy Irwin is a 68 y.o. female who reports fairly persistent moderate pain of her left shoulder; described as aching without radiation. Onset: abrupt, one week ago. Injury/trama: yes, reports slipping and falling on a wooden floor; "on my shoulder"; immediate discomfort. Symptoms have progressed to a point and plateaued since beginning.  Aggravating factors: movement. Alleviating factors: "not really sure". Associated symptoms: none reported. Extremity sensation changes or weakness: none. Self treatment: tried OTCs without relief of pain. History of similar: no. No head injury reported.  History reviewed. No pertinent surgical history.   ROS: As per HPI. All other systems negative.    OBJECTIVE:  Vitals:   08/28/18 1721  BP: 105/71  Pulse: 76  Resp: 20  Temp: 98.4 F (36.9 C)  TempSrc: Oral  SpO2: 95%    General appearance: alert; no distress HEENT: [place]; AT Neck: supple with FROM Lungs: CTAB; unlabored respirations Extremities: . LUE: warm and well perfused; poorly localized moderate tenderness over left shoulder; without gross deformities; with no swelling; with no bruising; ROM: limited by pain CV: brisk extremity capillary refill of LUE; 2+ radial pulse of LUE. Skin: warm and dry; no visible rashes Neurologic: gait normal; normal reflexes of RUE and LUE; normal sensation of RUE and LUE; normal strength of RUE and LUE Psychological: alert and cooperative; normal mood and affect  Allergies  Allergen Reactions  . Other Shortness Of Breath and Other (See Comments)    Newspaper ink =  new chest pain, also  . Iodine Other (See Comments)    "Was a long time ago" (Reaction??)  . Merbromin Other (See Comments)    Mercurochrome- "Was a long time ago" (Reaction??)    Past Medical History:  Diagnosis Date  . Diabetes mellitus without complication ([place])   . Hypersensitivity pneumonitis ([place]) 12/19/2017  . Hypertension    Social History   Socioeconomic History  . Marital status:  Married    Spouse name: Not on file  . Number of children: Not on file  . Years of education: Not on file  . Highest education level: Not on file  Occupational History  . Not on file  Social Needs  . Financial resource strain: Not on file  . Food insecurity:    Worry: Not on file    Inability: Not on file  .  Transportation needs:    Medical: Not on file    Non-medical: Not on file  Tobacco Use  . Smoking status: Never Smoker  . Smokeless tobacco: Never Used  Substance and Sexual Activity  . Alcohol use: Never    Frequency: Never  . Drug use: Never  . Sexual activity: Not on file  Lifestyle  . Physical activity:    Days per week: Not on file    Minutes per session: Not on file  . Stress: Not on file  Relationships  . Social connections:    Talks on phone: Not on file    Gets together: Not on file    Attends religious service: Not on file    Active member of club or organization: Not on file    Attends meetings of clubs or organizations: Not on file    Relationship status: Not on file  Other Topics Concern  . Not on file  Social History Narrative  . Not on file   History reviewed. No pertinent family history. History reviewed. No pertinent surgical history.    Vanessa Kick, MD 09/04/18 [phone]

## 2018-11-13 ENCOUNTER — Ambulatory Visit (HOSPITAL_COMMUNITY)
Admission: EM | Admit: 2018-11-13 | Discharge: 2018-11-13 | Disposition: A | Discharge status: Home / Self Care | Payer: Medicare Other | Source: Home / Self Care

## 2018-11-13 ENCOUNTER — Other Ambulatory Visit: Payer: Self-pay

## 2018-11-13 ENCOUNTER — Encounter (HOSPITAL_COMMUNITY): Payer: Self-pay

## 2018-11-13 DIAGNOSIS — J4520 Mild intermittent asthma, uncomplicated: Secondary | ICD-10-CM | POA: Diagnosis not present

## 2018-11-13 DIAGNOSIS — E119 Type 2 diabetes mellitus without complications: Secondary | ICD-10-CM

## 2018-11-13 DIAGNOSIS — R062 Wheezing: Secondary | ICD-10-CM

## 2018-11-13 MED ORDER — PROMETHAZINE-DM 6.25-15 MG/5ML PO SYRP: 5.0000 mL | ORAL_SOLUTION | Freq: Every evening | ORAL | 0 refills | Status: DC | PRN

## 2018-11-13 MED ORDER — PREDNISONE 20 MG PO TABS: ORAL_TABLET | ORAL | 0 refills | Status: DC

## 2018-11-13 NOTE — ED Triage Notes (Signed)
Pt states her asthma is flaring up this started on Monday.

## 2018-11-13 NOTE — ED Provider Notes (Signed)
MRN: [id number] DOB: 11/24/1950  Subjective:   Kathy Irwin is a 68 y.o. female presenting for 2-day history of mild to moderate persistent mostly dry cough with intermittent mild wheezing and shortness of breath.  Patient has been using her nebulized albuterol or inhaler a couple of times a day with some relief.  She is recently moved here and does not have a pulmonologist.  She has isolated and practice social distancing and is not interesting in doing a COVID-19 test.  Denies smoking cigarettes.  Denies using a inhaled steroid.  Blood sugar check is in the low 130s, last A1c by patient report is about 7%.  No current facility-administered medications for this encounter.   Current Outpatient Medications:  .  acetaminophen (TYLENOL) 500 MG tablet, Take 1,000 mg by mouth 2 (two) times daily as needed (for pain)., Disp: , Rfl:  .  albuterol (PROAIR HFA) 108 (90 Base) MCG/ACT inhaler, Inhale 2 puffs into the lungs every 6 (six) hours as needed for wheezing or shortness of breath., Disp: , Rfl:  .  albuterol (PROVENTIL) (2.5 MG/3ML) 0.083% nebulizer solution, Take 3 mLs (2.5 mg total) by nebulization every 6 (six) hours as needed for wheezing or shortness of breath., Disp: 75 mL, Rfl: 0 .  atorvastatin (LIPITOR) 20 MG tablet, Take 20 mg by mouth at bedtime. , Disp: , Rfl:  .  Azelastine-Fluticasone (DYMISTA NA), Place 1-2 sprays into both nostrils daily as needed (for allergies). , Disp: , Rfl:  .  glimepiride (AMARYL) 2 MG tablet, Take 2 mg by mouth 2 (two) times daily. , Disp: , Rfl:  .  Insulin Lispro Prot & Lispro (HUMALOG MIX 75/25 KWIKPEN) (75-25) 100 UNIT/ML Kwikpen, Inject 40 Units into the skin See admin instructions. Inject 40 units into the skin before breakfast "and a second dose at bedtime if BGL is still high", Disp: , Rfl:  .  loratadine (CLARITIN) 10 MG tablet, Take 10 mg by mouth daily as needed for allergies or rhinitis. , Disp: , Rfl:  .  metFORMIN (GLUCOPHAGE) 1000 MG tablet, Take  1,000 mg by mouth See admin instructions. Take 1,000 mg by mouth in the morning with breakfast "and a second dose of 1,000 mg daily if BGL is still elevated", Disp: , Rfl:  .  montelukast (SINGULAIR) 10 MG tablet, Take 10 mg by mouth at bedtime as needed (for seasonal allergies). , Disp: , Rfl:  .  olmesartan (BENICAR) 40 MG tablet, Take 40 mg by mouth daily., Disp: , Rfl:  .  omeprazole (PRILOSEC) 20 MG capsule, Take 20 mg by mouth daily., Disp: , Rfl:  .  oxybutynin (DITROPAN-XL) 10 MG 24 hr tablet, Take 10 mg by mouth daily as needed (for symptoms of bladder instability). , Disp: , Rfl:  .  polyethylene glycol powder (MIRALAX) powder, Take 17 g by mouth daily. , Disp: , Rfl:  .  predniSONE (DELTASONE) 20 MG tablet, Take 2 tablets (40 mg total) by mouth daily., Disp: 10 tablet, Rfl: 0 .  ranitidine (ZANTAC) 150 MG tablet, Take 150 mg by mouth at bedtime. , Disp: , Rfl:  .  traMADol (ULTRAM) 50 MG tablet, Take 1 tablet (50 mg total) by mouth every 6 (six) hours as needed., Disp: 15 tablet, Rfl: 0 .  traMADol-acetaminophen (ULTRACET) 37.5-325 MG tablet, Take 1 tablet by mouth every 6 (six) hours as needed (for pain). , Disp: , Rfl:  .  Vitamin D, Ergocalciferol, (DRISDOL) 50000 units CAPS capsule, Take 50,000 Units by mouth every Thursday.,  Disp: , Rfl:    Allergies  Allergen Reactions  . Other Shortness Of Breath and Other (See Comments)    Newspaper ink =  new chest pain, also  . Iodine Other (See Comments)    "Was a long time ago" (Reaction??)  . Merbromin Other (See Comments)    Mercurochrome- "Was a long time ago" (Reaction??)    Past Medical History:  Diagnosis Date  . Diabetes mellitus without complication ([place])   . 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 Hypertension      History reviewed. No pertinent surgical history.  Review of Systems  Constitutional: Negative for fever and malaise/fatigue.  HENT: Negative for congestion, ear pain, sinus pain and sore throat.         Postnasal drip.  Eyes: Negative for blurred vision, double vision, discharge and redness.  Respiratory: Positive for cough, shortness of breath and wheezing. Negative for hemoptysis.   Cardiovascular: Negative for chest pain.  Gastrointestinal: Negative for abdominal pain, diarrhea, nausea and vomiting.  Genitourinary: Negative for dysuria, flank pain and hematuria.  Musculoskeletal: Negative for myalgias.  Skin: Negative for rash.  Neurological: Negative for dizziness, weakness and headaches.  Psychiatric/Behavioral: Negative for depression and substance abuse.    Objective:   Vitals: BP 133/76 (BP Location: Right Arm)   Pulse 92   Temp 99.3 F (37.4 C) (Oral)   Resp 18   Wt 260 lb (117.9 kg)   SpO2 97%   BMI 44.63 kg/m   Physical Exam Constitutional:      General: She is not in acute distress.    Appearance: Normal appearance. She is well-developed. She is not ill-appearing, toxic-appearing or diaphoretic.  HENT:     Head: Normocephalic and atraumatic.     Nose: Nose normal.     Mouth/Throat:     Mouth: Mucous membranes are moist.     Pharynx: No oropharyngeal exudate or posterior oropharyngeal erythema.     Comments: Postnasal drainage and throat with cobblestone pattern. Eyes:     General: No scleral icterus.       Right eye: No discharge.        Left eye: No discharge.     Extraocular Movements: Extraocular movements intact.     Pupils: Pupils are equal, round, and reactive to light.  Cardiovascular:     Rate and Rhythm: Normal rate and regular rhythm.     Pulses: Normal pulses.     Heart sounds: Normal heart sounds. No murmur. No friction rub. No gallop.   Pulmonary:     Effort: Pulmonary effort is normal. No respiratory distress.     Breath sounds: No stridor. Wheezing (Mild expiratory mid to bibasilar fields) present. No rhonchi or rales.  Skin:    General: Skin is warm and dry.     Findings: No rash.  Neurological:     Mental Status: She is alert and  oriented to person, place, and time.  Psychiatric:        Mood and Affect: Mood normal.        Behavior: Behavior normal.        Thought Content: Thought content normal.     Assessment and Plan :   1. Mild intermittent asthma without complication   2. Wheezing   3. Controlled type 2 diabetes mellitus without complication, with long-term current use of insulin ([place])   4. Morbid obesity ([place])     We will use a steroid course to address her asthma.  Counseled patient on COVID-19,  risks of using steroids in light of possibility of having COVID-19.  Patient is adamant about not getting this test.  Recommend that she use cough suppression therapy.  Monitor blood sugars. Counseled patient on potential for adverse effects with medications prescribed/recommended today, ER and return-to-clinic precautions discussed, patient verbalized understanding.    Jaynee Eagles, [place] 11/13/18 1949

## 2018-11-13 NOTE — Discharge Instructions (Addendum)
For sore throat or cough try using a honey-based tea. Use 3 teaspoons of honey with juice squeezed from half lemon. Place shaved pieces of ginger into 1/2-1 cup of water and warm over stove top. Then mix the ingredients and repeat every 4 hours as needed. Please take Tylenol 58m every 6 hours. Hydrate very well with at least 2 liters of water. Eat light meals such as soups to replenish electrolytes and soft fruits, veggies. Start an antihistamine like Zyrtec, Allegra or Claritin for postnasal drainage, sinus congestion.

## 2018-11-15 ENCOUNTER — Other Ambulatory Visit: Payer: Self-pay

## 2018-11-15 ENCOUNTER — Encounter (HOSPITAL_COMMUNITY): Payer: Self-pay

## 2018-11-15 ENCOUNTER — Inpatient Hospital Stay (HOSPITAL_COMMUNITY)
Admission: EM | Admit: 2018-11-15 | Discharge: 2018-11-22 | DRG: 177 | Disposition: A | Discharge status: Home / Self Care | Payer: Medicare Other | Attending: Internal Medicine | Admitting: Internal Medicine

## 2018-11-15 ENCOUNTER — Emergency Department (HOSPITAL_COMMUNITY): Payer: Medicare Other

## 2018-11-15 DIAGNOSIS — J4541 Moderate persistent asthma with (acute) exacerbation: Secondary | ICD-10-CM | POA: Diagnosis not present

## 2018-11-15 DIAGNOSIS — Z7951 Long term (current) use of inhaled steroids: Secondary | ICD-10-CM

## 2018-11-15 DIAGNOSIS — I10 Essential (primary) hypertension: Secondary | ICD-10-CM | POA: Diagnosis not present

## 2018-11-15 DIAGNOSIS — Z7952 Long term (current) use of systemic steroids: Secondary | ICD-10-CM | POA: Diagnosis not present

## 2018-11-15 DIAGNOSIS — J9601 Acute respiratory failure with hypoxia: Secondary | ICD-10-CM | POA: Diagnosis present

## 2018-11-15 DIAGNOSIS — Z8701 Personal history of pneumonia (recurrent): Secondary | ICD-10-CM | POA: Diagnosis not present

## 2018-11-15 DIAGNOSIS — Z794 Long term (current) use of insulin: Secondary | ICD-10-CM

## 2018-11-15 DIAGNOSIS — E662 Morbid (severe) obesity with alveolar hypoventilation: Secondary | ICD-10-CM | POA: Diagnosis present

## 2018-11-15 DIAGNOSIS — Z91048 Other nonmedicinal substance allergy status: Secondary | ICD-10-CM | POA: Diagnosis not present

## 2018-11-15 DIAGNOSIS — J9600 Acute respiratory failure, unspecified whether with hypoxia or hypercapnia: Secondary | ICD-10-CM | POA: Diagnosis present

## 2018-11-15 DIAGNOSIS — E119 Type 2 diabetes mellitus without complications: Secondary | ICD-10-CM | POA: Diagnosis not present

## 2018-11-15 DIAGNOSIS — E1165 Type 2 diabetes mellitus with hyperglycemia: Secondary | ICD-10-CM | POA: Diagnosis present

## 2018-11-15 DIAGNOSIS — U071 COVID-19: Principal | ICD-10-CM | POA: Diagnosis present

## 2018-11-15 DIAGNOSIS — I110 Hypertensive heart disease with heart failure: Secondary | ICD-10-CM | POA: Diagnosis present

## 2018-11-15 DIAGNOSIS — I5032 Chronic diastolic (congestive) heart failure: Secondary | ICD-10-CM | POA: Diagnosis present

## 2018-11-15 DIAGNOSIS — J4520 Mild intermittent asthma, uncomplicated: Secondary | ICD-10-CM | POA: Diagnosis present

## 2018-11-15 DIAGNOSIS — J9621 Acute and chronic respiratory failure with hypoxia: Secondary | ICD-10-CM | POA: Diagnosis present

## 2018-11-15 DIAGNOSIS — K219 Gastro-esophageal reflux disease without esophagitis: Secondary | ICD-10-CM | POA: Diagnosis present

## 2018-11-15 DIAGNOSIS — E875 Hyperkalemia: Secondary | ICD-10-CM | POA: Diagnosis not present

## 2018-11-15 DIAGNOSIS — Z833 Family history of diabetes mellitus: Secondary | ICD-10-CM | POA: Diagnosis not present

## 2018-11-15 DIAGNOSIS — J1289 Other viral pneumonia: Secondary | ICD-10-CM

## 2018-11-15 DIAGNOSIS — D696 Thrombocytopenia, unspecified: Secondary | ICD-10-CM | POA: Diagnosis present

## 2018-11-15 DIAGNOSIS — R32 Unspecified urinary incontinence: Secondary | ICD-10-CM | POA: Diagnosis present

## 2018-11-15 DIAGNOSIS — J189 Pneumonia, unspecified organism: Secondary | ICD-10-CM

## 2018-11-15 DIAGNOSIS — Z8249 Family history of ischemic heart disease and other diseases of the circulatory system: Secondary | ICD-10-CM | POA: Diagnosis not present

## 2018-11-15 DIAGNOSIS — Z888 Allergy status to other drugs, medicaments and biological substances status: Secondary | ICD-10-CM

## 2018-11-15 DIAGNOSIS — E118 Type 2 diabetes mellitus with unspecified complications: Secondary | ICD-10-CM

## 2018-11-15 DIAGNOSIS — Z79899 Other long term (current) drug therapy: Secondary | ICD-10-CM

## 2018-11-15 DIAGNOSIS — Z6841 Body Mass Index (BMI) 40.0 and over, adult: Secondary | ICD-10-CM

## 2018-11-15 DIAGNOSIS — E785 Hyperlipidemia, unspecified: Secondary | ICD-10-CM | POA: Diagnosis present

## 2018-11-15 DIAGNOSIS — J1282 Pneumonia due to coronavirus disease 2019: Secondary | ICD-10-CM | POA: Diagnosis present

## 2018-11-15 LAB — CBC
HCT: 45.1 % (ref 36.0–46.0)
Hemoglobin: 14.4 g/dL (ref 12.0–15.0)
MCH: 30.9 pg (ref 26.0–34.0)
MCHC: 31.9 g/dL (ref 30.0–36.0)
MCV: 96.8 fL (ref 80.0–100.0)
Platelets: 65 10*3/uL — ABNORMAL LOW (ref 150–400)
RBC: 4.66 MIL/uL (ref 3.87–5.11)
RDW: 21.2 % — ABNORMAL HIGH (ref 11.5–15.5)
WBC: 3.9 10*3/uL — ABNORMAL LOW (ref 4.0–10.5)
nRBC: 4.4 % — ABNORMAL HIGH (ref 0.0–0.2)

## 2018-11-15 LAB — BASIC METABOLIC PANEL
Anion gap: 12 (ref 5–15)
BUN: 9 mg/dL (ref 8–23)
CO2: 22 mmol/L (ref 22–32)
Calcium: 9.6 mg/dL (ref 8.9–10.3)
Chloride: 102 mmol/L (ref 98–111)
Creatinine, Ser: 0.86 mg/dL (ref 0.44–1.00)
GFR calc Af Amer: >60 mL/min (ref >60)
GFR calc non Af Amer: >60 mL/min (ref >60)
Glucose, Bld: 224 mg/dL — ABNORMAL HIGH (ref 70–99)
Potassium: 4.5 mmol/L (ref 3.5–5.1)
Sodium: 136 mmol/L (ref 135–145)

## 2018-11-15 LAB — LACTIC ACID, PLASMA: Lactic Acid, Venous: 2.4 mmol/L (ref 0.5–1.9)

## 2018-11-15 LAB — SARS CORONAVIRUS 2 BY RT PCR (HOSPITAL ORDER, PERFORMED IN ~~LOC~~ HOSPITAL LAB): SARS Coronavirus 2: POSITIVE — AB

## 2018-11-15 LAB — BRAIN NATRIURETIC PEPTIDE: B Natriuretic Peptide: 74.4 pg/mL (ref 0.0–100.0)

## 2018-11-15 MED ORDER — METHYLPREDNISOLONE SODIUM SUCC 125 MG IJ SOLR
125.0000 mg | Freq: Once | INTRAMUSCULAR | Status: AC
  Administered 2018-11-15: 125 mg via INTRAVENOUS
  Filled 2018-11-15: qty 2

## 2018-11-15 MED ORDER — SODIUM CHLORIDE 0.9 % IV SOLN
500.0000 mg | INTRAVENOUS | Status: DC
  Administered 2018-11-16: 500 mg via INTRAVENOUS
  Filled 2018-11-15: qty 500

## 2018-11-15 MED ORDER — ALBUTEROL SULFATE HFA 108 (90 BASE) MCG/ACT IN AERS
4.0000 | INHALATION_SPRAY | Freq: Once | RESPIRATORY_TRACT | Status: AC
  Administered 2018-11-15: 4 via RESPIRATORY_TRACT

## 2018-11-15 MED ORDER — IPRATROPIUM BROMIDE HFA 17 MCG/ACT IN AERS
2.0000 | INHALATION_SPRAY | Freq: Once | RESPIRATORY_TRACT | Status: AC
  Administered 2018-11-16: 2 via RESPIRATORY_TRACT

## 2018-11-15 MED ORDER — ALBUTEROL SULFATE HFA 108 (90 BASE) MCG/ACT IN AERS
6.0000 | INHALATION_SPRAY | Freq: Once | RESPIRATORY_TRACT | Status: AC
  Administered 2018-11-15: 6 via RESPIRATORY_TRACT
  Filled 2018-11-15: qty 6.7

## 2018-11-15 MED ORDER — IPRATROPIUM BROMIDE HFA 17 MCG/ACT IN AERS
2.0000 | INHALATION_SPRAY | Freq: Once | RESPIRATORY_TRACT | Status: AC
  Administered 2018-11-16: 2 via RESPIRATORY_TRACT
  Filled 2018-11-15: qty 12.9

## 2018-11-15 MED ORDER — SODIUM CHLORIDE 0.9% FLUSH
3.0000 mL | Freq: Once | INTRAVENOUS | Status: AC
  Administered 2018-11-15: 3 mL via INTRAVENOUS

## 2018-11-15 MED ORDER — SODIUM CHLORIDE 0.9 % IV SOLN
2.0000 g | INTRAVENOUS | Status: DC
  Administered 2018-11-15: 2 g via INTRAVENOUS
  Filled 2018-11-15: qty 20

## 2018-11-15 NOTE — ED Notes (Signed)
Daughter Lavella Lemons [phone]

## 2018-11-15 NOTE — H&P (Signed)
Kathy Irwin KMM:[id number] DOB: 1951-02-18 DOA: 11/15/2018     PCP: Patient, No Pcp Per   Outpatient Specialists:   NONE    Patient arrived to ER on 11/15/18 at 2057  Patient coming from: home Lives alone,       Chief Complaint:  Chief Complaint  Patient presents with   Shortness of Breath   Cough    HPI: Kathy Irwin is a 68 y.o. female with medical history significant of  DM 2, HTN, HLD, asthma    Presented with 3 to 4 days of mild to moderate persistent dry cough sometimes wheezing and shortness of breath she has history of asthma and been using her nebulizer few times a day and it helped a bit.  NO GI symptoms, no stroke like symptoms, no anosmia She has been isolating herself and does not know any exposure to COVID-19 patient does not smoke Her cough has continued to worsen she was seen by urgent care yesterday prescribed prednisone but did not improve today she presented To emergency department was found to be hypoxic down to 85% room air in triage started on 2 L Patient denies associated fevers no headache no abdominal pain No chest pain She has been around her children   Infectious risk factors:  Reports , shortness of breath, dry cough,  In  ER RAPID COVID TEST  POSITIVE,     Regarding pertinent Chronic problems:      Hyperlipidemia - on statins Lipitor   HTN on benicar   CHF diastolic  - last echo 79/06/8331 EF 83-29% grade 1 diastolic dysfunction       DM 2 - on insulin and PO meds       Morbid obesity-   BMI Readings from Last 1 Encounters:  11/15/18 44.63 kg/m       Asthma -well  controlled on home inhalers/ nebs                           last no prior admission                          No  history of intubation     While in ER: Initially significantly hypoxic down to 82% patient was given albuterol and Atrovent treatment given Solu-Medrol IV chest x-ray worrisome for infiltrate IV antibiotics started Patient at this point able to  tolerate 4 L nasal cannula  The following Work up has been ordered so far:  Orders Placed This Encounter  Procedures   SARS Coronavirus 2 (Hospital order, Performed in [place] hospital lab) Nasopharyngeal Nasopharyngeal Swab   Blood Culture (routine x 2)   Culture, sputum-assessment   DG Chest Portable 1 View   Basic metabolic panel   CBC   Lactic acid, plasma   Procalcitonin   CBC with Differential/Platelet   D-dimer, quantitative (not at [facility])   Ferritin   Fibrinogen   Interleukin-6, Plasma   Lactate dehydrogenase   Sedimentation rate   C-reactive protein   Diet NPO time specified   Cardiac monitoring   Saline Lock IV, Maintain IV access   Cardiac monitoring   Novel Coronavirus PPE supplies (droplet and contact precautions) yellow stethoscopes, surgical mask, gowns, surgical caps, face shield, goggles, CAPR - on the floor/unit, cleaning [employer] (orange and purple top)   Place COVID-19 isolation sign and PPE checklist outside the DOOR   Do not give nonsteroidal anti-inflammatory drugs (  NSAIDs)   Patient to wear surgical mask during transportation   Place working phone next to the patient   Initiate Oral Care Protocol   Initiate Carrier Fluid Protocol   Cardiac Monitoring - Continuous Indefinite   Consult for Unassigned Medical Admission  ALL PATIENTS BEING ADMITTED/HAVING PROCEDURES NEED COVID-19 SCREENING   Airborne and Contact precautions   Pulse oximetry, continuous   POCT I-Stat EG7   EKG 12-Lead   ED EKG   EKG 12-Lead   Insert saline lock   Admit to Inpatient (patient's expected length of stay will be greater than 2 midnights or inpatient only procedure)    Following Medications were ordered in ER: Medications  ipratropium (ATROVENT HFA) inhaler 2 puff (has no administration in time range)  cefTRIAXone (ROCEPHIN) 2 g in sodium chloride 0.9 % 100 mL IVPB (0 g Intravenous Stopped 11/16/18 0100)  azithromycin (ZITHROMAX) 500  mg in sodium chloride 0.9 % 250 mL IVPB (500 mg Intravenous New Bag/Given 11/16/18 0101)  ipratropium (ATROVENT HFA) inhaler 2 puff (has no administration in time range)  ALPRAZolam (XANAX) tablet 0.25 mg (has no administration in time range)  sodium chloride flush (NS) 0.9 % injection 3 mL (3 mLs Intravenous Given 11/15/18 2145)  albuterol (VENTOLIN HFA) 108 (90 Base) MCG/ACT inhaler 6 puff (6 puffs Inhalation Given 11/15/18 2137)  methylPREDNISolone sodium succinate (SOLU-MEDROL) 125 mg/2 mL injection 125 mg (125 mg Intravenous Given 11/15/18 2145)  albuterol (VENTOLIN HFA) 108 (90 Base) MCG/ACT inhaler 4 puff (4 puffs Inhalation Given 11/15/18 2305)  acetaminophen (TYLENOL) tablet 650 mg (650 mg Oral Given 11/16/18 0101)    Significant initial  Findings: Abnormal Labs Reviewed  SARS CORONAVIRUS 2 ([place] [place] [facility]) - Abnormal; Notable for the following components:      Result Value   SARS Coronavirus 2 POSITIVE (*)    All other components within normal limits  BASIC METABOLIC PANEL - Abnormal; Notable for the following components:   Glucose, Bld 224 (*)    All other components within normal limits  CBC - Abnormal; Notable for the following components:   WBC 3.9 (*)    RDW 21.2 (*)    Platelets 65 (*)    nRBC 4.4 (*)    All other components within normal limits  LACTIC ACID, PLASMA - Abnormal; Notable for the following components:   Lactic Acid, Venous 2.4 (*)    All other components within normal limits  C-REACTIVE PROTEIN - Abnormal; Notable for the following components:   CRP 1.6 (*)    All other components within normal limits  POCT I-STAT EG7 - Abnormal; Notable for the following components:   pCO2, Ven 39.2 (*)    pO2, Ven 54.0 (*)    Hemoglobin 15.6 (*)    All other components within normal limits     Otherwise labs showing:    Recent Labs  Lab 11/15/18 2108 11/16/18 0048  NA 136 139  K 4.5 4.1  CO2 22  --   GLUCOSE 224*  --   BUN  9  --   CREATININE 0.86  --   CALCIUM 9.6  --     Cr   stable,    Lab Results  Component Value Date   CREATININE 0.86 11/15/2018   CREATININE 0.87 12/18/2017   CREATININE 0.70 12/17/2017    No results for input(s): AST, ALT, ALKPHOS, BILITOT, PROT, ALBUMIN in the last 168 hours. Lab Results  Component Value Date   CALCIUM 9.6 11/15/2018  WBC      Component Value Date/Time   WBC 3.9 (L) 11/15/2018 2108     Plt: Lab Results  Component Value Date   PLT 65 (L) 11/15/2018   Lactic Acid, Venous    Component Value Date/Time   LATICACIDVEN 2.4 (HH) 11/15/2018 2251      COVID-19 Labs  procalcitonin <0.1  Recent Labs    11/15/18 2334  CRP 1.6*    Lab Results  Component Value Date   SARSCOV2NAA POSITIVE (A) 11/15/2018       Venous  Blood Gas result:  pH 7.422  pCO 39.2;    ABG    Component Value Date/Time   HCO3 25.5 11/16/2018 0048   TCO2 27 11/16/2018 0048   O2SAT 88.0 11/16/2018 0048      HG/HCT stable,       Component Value Date/Time   HGB 15.6 (H) 11/16/2018 0048   HCT 46.0 11/16/2018 0048      BNP (last 3 results) Recent Labs    12/17/17 1440 11/15/18 2108  BNP 9.1 74.4       UA  ordered     CXR - atypical PNA    ECG:  Personally reviewed by me showing: HR : 118 Rhythm:  Sinus tachycardia    no evidence of ischemic changes QTC 437     ED Triage Vitals  Enc Vitals Group     BP 11/15/18 2108 (!) 142/91     Pulse Rate 11/15/18 2108 (!) 116     Resp 11/15/18 2108 20     Temp 11/15/18 2108 98.6 F (37 C)     Temp Source 11/15/18 2108 Oral     SpO2 11/15/18 2108 95 %     Weight 11/15/18 2146 260 lb (117.9 kg)     Height 11/15/18 2146 _0  (1.626 m)     Head Circumference --      Peak Flow --      Pain Score 11/15/18 2104 0     Pain Loc --      Pain Edu? --      Excl. in [place]? --   TMAX(24)@       Latest  Blood pressure (!) 154/80, pulse (!) 111, temperature 98.6 F (37 C), temperature source Oral, resp. rate (!)  35, height _1  (1.626 m), weight 117.9 kg, SpO2 95 %.    Hospitalist was called for admission for Covid 19 pneumonia   Review of Systems:    Pertinent positives include: fatigue shortness of breath at rest.   dyspnea on exertion non-productive cough, Constitutional:  No weight loss, night sweats, Fevers, chills, , weight loss  HEENT:  No headaches, Difficulty swallowing,Tooth/dental problems,Sore throat,  No sneezing, itching, ear ache, nasal congestion, post nasal drip,  Cardio-vascular:  No chest pain, Orthopnea, PND, anasarca, dizziness, palpitations.no Bilateral lower extremity swelling  GI:  No heartburn, indigestion, abdominal pain, nausea, vomiting, diarrhea, change in bowel habits, loss of appetite, melena, blood in stool, hematemesis Resp:  no , No excess mucus, no productive cough, No  No coughing up of blood.No change in color of mucus.No wheezing. Skin:  no rash or lesions. No jaundice GU:  no dysuria, change in color of urine, no urgency or frequency. No straining to urinate.  No flank pain.  Musculoskeletal:  No joint pain or no joint swelling. No decreased range of motion. No back pain.  Psych:  No change in mood or affect. No depression or anxiety. No memory loss.  Neuro: no localizing neurological complaints, no tingling, no weakness, no double vision, no gait abnormality, no slurred speech, no confusion  All systems reviewed and apart from [place] all are negative  Past Medical History:   Past Medical History:  Diagnosis Date   Diabetes mellitus without complication ([place])    Hypersensitivity pneumonitis ([place]) 12/19/2017   Hypertension       History reviewed. No pertinent surgical history.  Social History:  Ambulatory  independently       reports that she has never smoked. She has never used smokeless tobacco. She reports that she does not drink alcohol or use drugs.     Family History:   Family History  Problem Relation Age of Onset    Diabetes Other    Hypertension Other     Allergies: Allergies  Allergen Reactions   Other Shortness Of Breath and Other (See Comments)    Newspaper ink =  new chest pain, also   Iodine Other (See Comments)    "Was a long time ago" (Reaction??)   Merbromin Other (See Comments)    Mercurochrome- "Was a long time ago" (Reaction??)     Prior to Admission medications   Medication Sig Start Date End Date Taking? Authorizing Provider  acetaminophen (TYLENOL) 500 MG tablet Take 1,000 mg by mouth 2 (two) times daily as needed (for pain).    [provider]  albuterol (PROAIR HFA) 108 (90 Base) MCG/ACT inhaler Inhale 2 puffs into the lungs every 6 (six) hours as needed for wheezing or shortness of breath.    [provider]  albuterol (PROVENTIL) (2.5 MG/3ML) 0.083% nebulizer solution Take 3 mLs (2.5 mg total) by nebulization every 6 (six) hours as needed for wheezing or shortness of breath. 12/20/17   Samuella Cota, MD  atorvastatin (LIPITOR) 20 MG tablet Take 20 mg by mouth at bedtime.     [provider]  Azelastine-Fluticasone (DYMISTA NA) Place 1-2 sprays into both nostrils daily as needed (for allergies).     [provider]  glimepiride (AMARYL) 2 MG tablet Take 2 mg by mouth 2 (two) times daily.     [provider]  Insulin Lispro Prot & Lispro (HUMALOG MIX 75/25 KWIKPEN) (75-25) 100 UNIT/ML Kwikpen Inject 40 Units into the skin See admin instructions. Inject 40 units into the skin before breakfast "and a second dose at bedtime if BGL is still high"    [provider]  loratadine (CLARITIN) 10 MG tablet Take 10 mg by mouth daily as needed for allergies or rhinitis.     [provider]  metFORMIN (GLUCOPHAGE) 1000 MG tablet Take 1,000 mg by mouth See admin instructions. Take 1,000 mg by mouth in the morning with breakfast "and a second dose of 1,000 mg daily if BGL is still elevated"    [provider]  montelukast  (SINGULAIR) 10 MG tablet Take 10 mg by mouth at bedtime as needed (for seasonal allergies).     [provider]  olmesartan (BENICAR) 40 MG tablet Take 40 mg by mouth daily.    [provider]  omeprazole (PRILOSEC) 20 MG capsule Take 20 mg by mouth daily.    [provider]  oxybutynin (DITROPAN-XL) 10 MG 24 hr tablet Take 10 mg by mouth daily as needed (for symptoms of bladder instability).     [provider]  polyethylene glycol powder (MIRALAX) powder Take 17 g by mouth daily.     [provider]  predniSONE (DELTASONE) 20  MG tablet Take 2 tablets daily with breakfast. 11/13/18   Jaynee Eagles, PA-C  promethazine-dextromethorphan (PROMETHAZINE-DM) 6.25-15 MG/5ML syrup Take 5 mLs by mouth at bedtime as needed for cough. 11/13/18   Jaynee Eagles, PA-C  ranitidine (ZANTAC) 150 MG tablet Take 150 mg by mouth at bedtime.     [provider]  traMADol (ULTRAM) 50 MG tablet Take 1 tablet (50 mg total) by mouth every 6 (six) hours as needed. 08/28/18   Vanessa Kick, MD  traMADol-acetaminophen (ULTRACET) 37.5-325 MG tablet Take 1 tablet by mouth every 6 (six) hours as needed (for pain).     [provider]  Vitamin D, Ergocalciferol, (DRISDOL) 50000 units CAPS capsule Take 50,000 Units by mouth every Thursday.    [provider]   Physical Exam: Blood pressure (!) 154/80, pulse (!) 111, temperature 98.6 F (37 C), temperature source Oral, resp. rate (!) 35, height _0  (1.626 m), weight 117.9 kg, SpO2 95 %. 1. General:  in No  Acute distress   well -appearing 2. Psychological: Alert and  Oriented 3. Head/ENT:    Dry Mucous Membranes                          Head Non traumatic, neck supple                           Poor Dentition 4. SKIN:    decreased Skin turgor,  Skin clean Dry and intact no rash 5. Heart: Regular rate and rhythm no  Murmur, no Rub or gallop 6. Lungs:   no wheezes or crackles   7. Abdomen: Soft,  non-tender, Non  distended   Obese bowel sounds present 8. Lower extremities: no clubbing, cyanosis, no  edema 9. Neurologically Grossly intact, moving all 4 extremities equally  10. MSK: Normal range of motion   All other LABS:     Recent Labs  Lab 11/15/18 2108 11/16/18 0048  WBC 3.9*  --   HGB 14.4 15.6*  HCT 45.1 46.0  MCV 96.8  --   PLT 65*  --      Recent Labs  Lab 11/15/18 2108 11/16/18 0048  NA 136 139  K 4.5 4.1  CL 102  --   CO2 22  --   GLUCOSE 224*  --   BUN 9  --   CREATININE 0.86  --   CALCIUM 9.6  --      No results for input(s): AST, ALT, ALKPHOS, BILITOT, PROT, ALBUMIN in the last 168 hours.     Cultures: No results found for: SDES, [place], CULT, REPTSTATUS   Radiological Exams on Admission: Dg Chest Portable 1 View  Result Date: 11/15/2018 CLINICAL DATA:  Shortness of breath and cough. EXAM: PORTABLE CHEST 1 VIEW COMPARISON:  December 17, 2017 FINDINGS: The heart size is enlarged. The lung volumes are low. There are prominent interstitial lung markings bilaterally. There are a few ground-glass airspace opacities bilaterally. There is no pneumothorax. There is no definite large pleural effusion. No acute osseous abnormality. IMPRESSION: Findings concerning for an atypical infectious process versus pulmonary edema. Electronically Signed   By: Constance Holster M.D.   On: 11/15/2018 21:35    Chart has been reviewed    Assessment/Plan   68 y.o. female with medical history significant of  DM 2, HTN, HLD, asthma  Admitted for Covid PNA  Present on Admission:  Pneumonia due to COVID-19 virus -  Initial Rapid Novel Corona Virus testing:  Ordered 11/15/2018 and is  positive    - As per hx pt with evidence of   Cough:   shortness of breath    - new severe hypoxia  Following concerning LAB/ imaging findings:    Procalcitonin: low   CXR: hazy bilateral peripheral opacities or otherwise abnormal         -Following work-up initiated:          sputum  cultures  Ordered 11/16/18, Blood cultures Ordered 11/16/18,   Following complications noted:  will be monitoring for evidence of other complications such as PE/DVT CVA or  cardiovascular events  Plan of treatment: - Transfer to [facility] facility if positive and beds are available    -Hypoxia initiate steroids Solu-Medrol 40 every 6 hours And pharmacy consult for remdesivir - Will follow daily d.dimer - Assess for ability to prone  - Supportive management -Fluid sparing resuscitation  -Provide oxygen as needed currently onSpO2: 91 % O2 Flow Rate (L/min): 6 L/min - IF d.dimer elvated >5 will increase dose of lovenox      Poor Prognostic factors  68 y.o.  Personal hx of  DM2,   HTN, obesity  Evidence of  organ damage  Present respiratory failure requiring >4L [place]  tachypnea, tachycardia present on admission   Airborne precautions ordered     Will order Airborne and Contact precautions       HTN (hypertension) chronic stable continue home medications  Acute respiratory failure with hypoxia ([place]) secondary to cope with infection currently up to 4 L we will continue to monitor admit to progressive care evaluate for ability to self prone  Thrombocytopenia ([place]) slightly secondary to COVID infection continue to monitor DM2 -  - Order Sensitive  SSI   -  switch to   Lantus 40 units,  -  check TSH and HgA1C  - Hold by mouth medications       Other plan as per orders.  DVT prophylaxis:  Lovenox     Code Status:  FULL CODE  as per patient  I had personally discussed CODE STATUS with patient    Family Communication:   Family not at  Bedside    Disposition Plan:     To home once workup is complete and patient is stable                   Consults called: none   Admission status:  ED Disposition    ED Disposition Condition [place]: [place] [[id number]]  Level of Care: Progressive [102]  Covid Evaluation: Confirmed COVID  Positive  Diagnosis: Pneumonia due to COVID-19 virus [[id number]]  Admitting Physician: Toy Baker [[id number]]  Attending Physician: Toy Baker [[id number]]  Estimated length of stay: 3 - 4 days  Certification:: I certify this patient will need inpatient services for at least 2 midnights  PT Class (Do Not Modify): Inpatient [101]  PT Acc Code (Do Not Modify): Private [1]        inpatient     Expect 2 midnight stay secondary to severity of patient's current illness including   hemodynamic instability despite optimal treatment (tachycardia   Tachypnea hypoxia,  )   Severe lab/radiological/exam abnormalities including:  CXR needs of atypical pneumonia and COVID positive   and extensive comorbidities including: DM2    That are currently affecting medical management.   I expect  patient to be hospitalized for 2  midnights requiring inpatient medical care.  Patient is at high risk for adverse outcome (such as loss of life or disability) if not treated.  Indication for inpatient stay as follows:   Hemodynamic instability despite maximal medical therapy,    inability to maintain oral hydration (NPO for raspatory distress)   New or worsening hypoxia  Need for    IV   medications       Level of care        SDU tele indefinitely please discontinue once patient no longer qualifies  Precautions:   Airborne and Contact precautions  PPE: Used by the provider:   P100  eye Goggles,  Gloves  gown      Rylyn Zawistowski 11/16/2018, 1:03 AM    [facility]     after 2 AM please page floor coverage PA If 7AM-7PM, please contact the day team taking care of the patient using [email or web address]

## 2018-11-15 NOTE — ED Triage Notes (Signed)
Pt reports worsening cough and shortness of breath over the past few days, pt taking prednisone with little relief. Pt 85% on room air in triage, 2L [place] applied

## 2018-11-15 NOTE — ED Notes (Signed)
Pt O2 stats dropped while getting her out of her pant while placing the pure wick on her, but come back up to 99%

## 2018-11-15 NOTE — ED Provider Notes (Addendum)
[facility] EMERGENCY DEPARTMENT Provider Note   CSN: [id number] Arrival date & time: 11/15/18  2057     History   Chief Complaint Chief Complaint  Patient presents with  . Shortness of Breath  . Cough    HPI Kathy Irwin is a 68 y.o. female.     Patient with hx reactive airway disease, daily mdi use, presents with increased non prod cough, and increased sob and wheezing in the past 3-4 days. Symptoms acute onset, moderate-severe, persistent, worsening today. Denies sore throat or runny nose. No known covid+ exposure. Denies chest pain or discomfort. No abd pain or nvd. No dysuria or gu c/o. No increased leg swelling or leg pain. Patient saw urgent care 2 days ago, given steroid rx, but symptoms worse, and was told to come to ED.   The history is provided by the patient.  Shortness of Breath Associated symptoms: cough   Associated symptoms: no abdominal pain, no chest pain, no fever, no headaches, no neck pain, no rash, no sore throat and no vomiting   Cough Associated symptoms: shortness of breath   Associated symptoms: no chest pain, no fever, no headaches, no rash and no sore throat     Past Medical History:  Diagnosis Date  . Diabetes mellitus without complication ([place])   . 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 Hypertension     Patient Active Problem List   Diagnosis Date Noted  . Acute respiratory failure with hypoxia ([place]) 12/19/2017  . 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 Thrombocytopenia ([place]) 12/19/2017  . Pneumonitis 12/17/2017  . DM (diabetes mellitus) ([place]) 12/17/2017  . HTN (hypertension) 12/17/2017    History reviewed. No pertinent surgical history.   OB History   No obstetric history on file.      Home Medications    Prior to Admission medications   Medication Sig Start Date End Date Taking? Authorizing Provider  acetaminophen (TYLENOL) 500 MG tablet Take 1,000 mg by mouth 2 (two) times daily as needed  (for pain).    [provider]  albuterol (PROAIR HFA) 108 (90 Base) MCG/ACT inhaler Inhale 2 puffs into the lungs every 6 (six) hours as needed for wheezing or shortness of breath.    [provider]  albuterol (PROVENTIL) (2.5 MG/3ML) 0.083% nebulizer solution Take 3 mLs (2.5 mg total) by nebulization every 6 (six) hours as needed for wheezing or shortness of breath. 12/20/17   Samuella Cota, MD  atorvastatin (LIPITOR) 20 MG tablet Take 20 mg by mouth at bedtime.     [provider]  Azelastine-Fluticasone (DYMISTA NA) Place 1-2 sprays into both nostrils daily as needed (for allergies).     [provider]  glimepiride (AMARYL) 2 MG tablet Take 2 mg by mouth 2 (two) times daily.     [provider]  Insulin Lispro Prot & Lispro (HUMALOG MIX 75/25 KWIKPEN) (75-25) 100 UNIT/ML Kwikpen Inject 40 Units into the skin See admin instructions. Inject 40 units into the skin before breakfast "and a second dose at bedtime if BGL is still high"    [provider]  loratadine (CLARITIN) 10 MG tablet Take 10 mg by mouth daily as needed for allergies or rhinitis.     [provider]  metFORMIN (GLUCOPHAGE) 1000 MG tablet Take 1,000 mg by mouth See admin instructions. Take 1,000 mg by mouth in the morning with breakfast "and a second dose of 1,000 mg daily if BGL is still elevated"    [provider]  montelukast (SINGULAIR) 10 MG tablet Take 10 mg by mouth at bedtime as needed (for seasonal allergies).     [provider]  olmesartan (BENICAR) 40 MG tablet Take 40 mg by mouth daily.    [provider]  omeprazole (PRILOSEC) 20 MG capsule Take 20 mg by mouth daily.    [provider]  oxybutynin (DITROPAN-XL) 10 MG 24 hr tablet Take 10 mg by mouth daily as needed (for symptoms of bladder instability).     [provider]  polyethylene glycol powder (MIRALAX) powder Take 17 g by mouth daily.     [provider]  predniSONE (DELTASONE) 20 MG tablet Take 2 tablets daily with breakfast. 11/13/18   Jaynee Eagles, PA-C  promethazine-dextromethorphan (PROMETHAZINE-DM) 6.25-15 MG/5ML syrup Take 5 mLs by mouth at bedtime as needed for cough. 11/13/18   Jaynee Eagles, PA-C  ranitidine (ZANTAC) 150 MG tablet Take 150 mg by mouth at bedtime.     [provider]  traMADol (ULTRAM) 50 MG tablet Take 1 tablet (50 mg total) by mouth every 6 (six) hours as needed. 08/28/18   Vanessa Kick, MD  traMADol-acetaminophen (ULTRACET) 37.5-325 MG tablet Take 1 tablet by mouth every 6 (six) hours as needed (for pain).     [provider]  Vitamin D, Ergocalciferol, (DRISDOL) 50000 units CAPS capsule Take 50,000 Units by mouth every Thursday.    [provider]    Family History No family history on file.  Social History Social History   Tobacco Use  . Smoking status: Never Smoker  . Smokeless tobacco: Never Used  Substance Use Topics  . Alcohol use: Never    Frequency: Never  . Drug use: Never     Allergies   Other, Iodine, and Merbromin   Review of Systems Review of Systems  Constitutional: Negative for fever.  HENT: Negative for sore throat.   Eyes: Negative for redness.  Respiratory: Positive for cough and shortness of breath.   Cardiovascular: Negative for chest pain.  Gastrointestinal: Negative for abdominal pain, diarrhea and vomiting.  Endocrine: Negative for polyuria.  Genitourinary: Negative for dysuria and flank pain.  Musculoskeletal: Negative for back pain and neck pain.  Skin: Negative for rash.  Neurological: Negative for weakness, numbness and headaches.  Hematological: Does not bruise/bleed easily.  Psychiatric/Behavioral: Negative for confusion.     Physical Exam Updated Vital Signs BP (!) 142/91 (BP Location: Right Arm)   Pulse (!) 116   Temp 98.6 F (37 C) (Oral)   Resp 20   SpO2 95%   Physical Exam Vitals signs and nursing note reviewed.   Constitutional:      Appearance: Normal appearance. She is well-developed.  HENT:     Head: Atraumatic.     Nose: Nose normal.     Mouth/Throat:     Mouth: Mucous membranes are moist.  Eyes:     General: No scleral icterus.    Conjunctiva/sclera: Conjunctivae normal.     Pupils: Pupils are equal, round, and reactive to light.  Neck:     Musculoskeletal: Normal range of motion and neck supple. No neck rigidity or muscular tenderness.     Trachea: No tracheal deviation.  Cardiovascular:     Rate and Rhythm: Normal rate and regular rhythm.     Pulses: Normal pulses.     Heart sounds: Normal heart sounds. No murmur. No friction rub. No gallop.   Pulmonary:     Effort: Respiratory distress present.  Breath sounds: Wheezing present.     Comments: Coughing, dry.  Abdominal:     General: Bowel sounds are normal. There is no distension.     Palpations: Abdomen is soft.     Tenderness: There is no abdominal tenderness. There is no guarding.  Genitourinary:    Comments: No cva tenderness.  Musculoskeletal:        General: No swelling or tenderness.     Right lower leg: No edema.     Left lower leg: No edema.  Skin:    General: Skin is warm and dry.     Findings: No rash.  Neurological:     Mental Status: She is alert.     Comments: Alert, speech normal.   Psychiatric:        Mood and Affect: Mood normal.      ED Treatments / Results  Labs (all labs ordered are listed, but only abnormal results are displayed) Results for orders placed or performed during the hospital encounter of 46/28/63  Basic metabolic panel  Result Value Ref Range   Sodium 136 135 - 145 mmol/L   Potassium 4.5 3.5 - 5.1 mmol/L   Chloride 102 98 - 111 mmol/L   CO2 22 22 - 32 mmol/L   Glucose, Bld 224 (H) 70 - 99 mg/dL   BUN 9 8 - 23 mg/dL   Creatinine, Ser 0.86 0.44 - 1.00 mg/dL   Calcium 9.6 8.9 - 10.3 mg/dL   GFR calc non Af Amer >60 >60 mL/min   GFR calc Af Amer >60 >60 mL/min   Anion gap 12  5 - 15  CBC  Result Value Ref Range   WBC 3.9 (L) 4.0 - 10.5 K/uL   RBC 4.66 3.87 - 5.11 MIL/uL   Hemoglobin 14.4 12.0 - 15.0 g/dL   HCT 45.1 36.0 - 46.0 %   MCV 96.8 80.0 - 100.0 fL   MCH 30.9 26.0 - 34.0 pg   MCHC 31.9 30.0 - 36.0 g/dL   RDW 21.2 (H) 11.5 - 15.5 %   Platelets 65 (L) 150 - 400 K/uL   nRBC 4.4 (H) 0.0 - 0.2 %  Brain natriuretic peptide  Result Value Ref Range   B Natriuretic Peptide 74.4 0.0 - 100.0 pg/mL   Dg Chest Portable 1 View  Result Date: 11/15/2018 CLINICAL DATA:  Shortness of breath and cough. EXAM: PORTABLE CHEST 1 VIEW COMPARISON:  December 17, 2017 FINDINGS: The heart size is enlarged. The lung volumes are low. There are prominent interstitial lung markings bilaterally. There are a few ground-glass airspace opacities bilaterally. There is no pneumothorax. There is no definite large pleural effusion. No acute osseous abnormality. IMPRESSION: Findings concerning for an atypical infectious process versus pulmonary edema. Electronically Signed   By: Constance Holster M.D.   On: 11/15/2018 21:35    EKG EKG Interpretation  Date/Time:  Friday November 15 2018 21:05:04 EDT Ventricular Rate:  118 PR Interval:  158 QRS Duration: 80 QT Interval:  312 QTC Calculation: 437 R Axis:   -23 Text Interpretation:  Sinus tachycardia Biatrial enlargement Left ventricular hypertrophy No significant change since last tracing Confirmed by Lajean Saver [phone]) on 11/15/2018 9:13:24 PM   Radiology Dg Chest Portable 1 View  Result Date: 11/15/2018 CLINICAL DATA:  Shortness of breath and cough. EXAM: PORTABLE CHEST 1 VIEW COMPARISON:  December 17, 2017 FINDINGS: The heart size is enlarged. The lung volumes are low. There are prominent interstitial lung markings bilaterally. There are a few ground-glass  airspace opacities bilaterally. There is no pneumothorax. There is no definite large pleural effusion. No acute osseous abnormality. IMPRESSION: Findings concerning for an atypical  infectious process versus pulmonary edema. Electronically Signed   By: Constance Holster M.D.   On: 11/15/2018 21:35    Procedures Procedures (including critical care time)  Medications Ordered in ED Medications  sodium chloride flush (NS) 0.9 % injection 3 mL (has no administration in time range)  albuterol (VENTOLIN HFA) 108 (90 Base) MCG/ACT inhaler 6 puff (has no administration in time range)  ipratropium (ATROVENT HFA) inhaler 2 puff (has no administration in time range)  methylPREDNISolone sodium succinate (SOLU-MEDROL) 125 mg/2 mL injection 125 mg (has no administration in time range)     Initial Impression / Assessment and Plan / ED Course  I have reviewed the triage vital signs and the nursing notes.  Pertinent labs & imaging results that were available during my care of the patient were reviewed by me and considered in my medical decision making (see chart for details).  Iv ns. Continuous pulse ox and monitor. Initially in severe resp distress, pulse ox 82% room air.  o2 mask.  Albuterol and atrovent treatments. Solumedrol iv. Stat cxr and stat labs.  covid test sent.  Reviewed nursing notes and prior charts for additional history.   Labs reviewed by me - bnp normal.   Cxr reviewed by me - ?infiltrate/pna.   Blood culture sent. Lactic sent.   Iv antibiotics given. covid remains pending.   Transitioned to nasal cannula, 4 liters.   Klover Priestly was evaluated in Emergency Department on 11/15/2018 for the symptoms described in the history of present illness. She was evaluated in the context of the global COVID-19 pandemic, which necessitated consideration that the patient might be at risk for infection with the SARS-CoV-2 virus that causes COVID-19. Institutional protocols and algorithms that pertain to the evaluation of patients at risk for COVID-19 are in a state of rapid change based on information released by regulatory bodies including the CDC and federal and state  organizations. These policies and algorithms were followed during the patient's care in the ED.  Given hypoxia, cough, wheezing, infiltrate on cxr - will admit.   Medicine team consulted for admission.  covid test returns positive.   CRITICAL CARE RE: acute respiratory failure w hypoxia, covid infection. hyperglycemia, wheezing/reactive airway disease, thrombocytopenia  Performed by: Mirna Mires Total critical care time: 40 minutes Critical care time was exclusive of separately billable procedures and treating other patients. Critical care was necessary to treat or prevent imminent or life-threatening deterioration. Critical care was time spent personally by me on the following activities: development of treatment plan with patient and/or surrogate as well as nursing, discussions with consultants, evaluation of patient's response to treatment, examination of patient, obtaining history from patient or surrogate, ordering and performing treatments and interventions, ordering and review of laboratory studies, ordering and review of radiographic studies, pulse oximetry and re-evaluation of patient's condition.   Final Clinical Impressions(s) / ED Diagnoses   Final diagnoses:  None    ED Discharge Orders    None           Lajean Saver, MD 11/15/18 2312

## 2018-11-15 NOTE — ED Provider Notes (Signed)
Admission d/w Dr. Roel Cluck.   Patient tachypneic but in no significant respiratory distress on nasal cannula.  She is able to speak in short sentences.  Admitted for hypoxia, cough, wheezing in setting of positive coronavirus as well as reactive airway disease.   Ezequiel Essex, MD 11/15/18 2342

## 2018-11-16 ENCOUNTER — Encounter (HOSPITAL_COMMUNITY): Payer: Self-pay

## 2018-11-16 DIAGNOSIS — I10 Essential (primary) hypertension: Secondary | ICD-10-CM

## 2018-11-16 DIAGNOSIS — J9601 Acute respiratory failure with hypoxia: Secondary | ICD-10-CM

## 2018-11-16 LAB — COMPREHENSIVE METABOLIC PANEL
ALT: 21 U/L (ref 0–44)
AST: 56 U/L — ABNORMAL HIGH (ref 15–41)
Albumin: 3.8 g/dL (ref 3.5–5.0)
Alkaline Phosphatase: 58 U/L (ref 38–126)
Anion gap: 11 (ref 5–15)
BUN: 12 mg/dL (ref 8–23)
CO2: 20 mmol/L — ABNORMAL LOW (ref 22–32)
Calcium: 9.3 mg/dL (ref 8.9–10.3)
Chloride: 104 mmol/L (ref 98–111)
Creatinine, Ser: 0.72 mg/dL (ref 0.44–1.00)
GFR calc Af Amer: >60 mL/min (ref >60)
GFR calc non Af Amer: >60 mL/min (ref >60)
Glucose, Bld: 227 mg/dL — ABNORMAL HIGH (ref 70–99)
Potassium: 5.4 mmol/L — ABNORMAL HIGH (ref 3.5–5.1)
Sodium: 135 mmol/L (ref 135–145)
Total Bilirubin: 1.1 mg/dL (ref 0.3–1.2)
Total Protein: 6.2 g/dL — ABNORMAL LOW (ref 6.5–8.1)

## 2018-11-16 LAB — POCT I-STAT EG7
Acid-Base Excess: 1 mmol/L (ref 0.0–2.0)
Bicarbonate: 25.5 mmol/L (ref 20.0–28.0)
Calcium, Ion: 1.21 mmol/L (ref 1.15–1.40)
HCT: 46 % (ref 36.0–46.0)
Hemoglobin: 15.6 g/dL — ABNORMAL HIGH (ref 12.0–15.0)
O2 Saturation: 88 %
Potassium: 4.1 mmol/L (ref 3.5–5.1)
Sodium: 139 mmol/L (ref 135–145)
TCO2: 27 mmol/L (ref 22–32)
pCO2, Ven: 39.2 mmHg — ABNORMAL LOW (ref 44.0–60.0)
pH, Ven: 7.422 (ref 7.250–7.430)
pO2, Ven: 54 mmHg — ABNORMAL HIGH (ref 32.0–45.0)

## 2018-11-16 LAB — D-DIMER, QUANTITATIVE: D-Dimer, Quant: 0.45 ug/mL-FEU (ref 0.00–0.50)

## 2018-11-16 LAB — SEDIMENTATION RATE: Sed Rate: 1 mm/hr (ref 0–22)

## 2018-11-16 LAB — GLUCOSE, CAPILLARY
Glucose-Capillary: 200 mg/dL — ABNORMAL HIGH (ref 70–99)
Glucose-Capillary: 284 mg/dL — ABNORMAL HIGH (ref 70–99)
Glucose-Capillary: 230 mg/dL — ABNORMAL HIGH (ref 70–99)
Glucose-Capillary: 182 mg/dL — ABNORMAL HIGH (ref 70–99)

## 2018-11-16 LAB — BASIC METABOLIC PANEL
Anion gap: 8 (ref 5–15)
BUN: 17 mg/dL (ref 8–23)
CO2: 28 mmol/L (ref 22–32)
Calcium: 10.1 mg/dL (ref 8.9–10.3)
Chloride: 104 mmol/L (ref 98–111)
Creatinine, Ser: 0.81 mg/dL (ref 0.44–1.00)
GFR calc Af Amer: >60 mL/min (ref >60)
GFR calc non Af Amer: >60 mL/min (ref >60)
Glucose, Bld: 217 mg/dL — ABNORMAL HIGH (ref 70–99)
Potassium: 4.5 mmol/L (ref 3.5–5.1)
Sodium: 140 mmol/L (ref 135–145)

## 2018-11-16 LAB — TROPONIN I (HIGH SENSITIVITY)
Troponin I (High Sensitivity): 4 ng/L (ref <18)
Troponin I (High Sensitivity): 4 ng/L (ref <18)

## 2018-11-16 LAB — ABO/RH: ABO/RH(D): AB POS

## 2018-11-16 LAB — CBG MONITORING, ED: Glucose-Capillary: 275 mg/dL — ABNORMAL HIGH (ref 70–99)

## 2018-11-16 LAB — FERRITIN: Ferritin: 276 ng/mL (ref 11–307)

## 2018-11-16 LAB — C-REACTIVE PROTEIN: CRP: 1.6 mg/dL — ABNORMAL HIGH (ref <1.0)

## 2018-11-16 LAB — LACTIC ACID, PLASMA: Lactic Acid, Venous: 2.3 mmol/L (ref 0.5–1.9)

## 2018-11-16 LAB — MAGNESIUM: Magnesium: 1.7 mg/dL (ref 1.7–2.4)

## 2018-11-16 LAB — LACTATE DEHYDROGENASE: LDH: 480 U/L — ABNORMAL HIGH (ref 98–192)

## 2018-11-16 LAB — FIBRINOGEN: Fibrinogen: 327 mg/dL (ref 210–475)

## 2018-11-16 LAB — PROCALCITONIN: Procalcitonin: <0.1 ng/mL

## 2018-11-16 LAB — HEMOGLOBIN A1C
Hgb A1c MFr Bld: 7.6 % — ABNORMAL HIGH (ref 4.8–5.6)
Mean Plasma Glucose: 171.42 mg/dL

## 2018-11-16 MED ORDER — PROMETHAZINE-DM 6.25-15 MG/5ML PO SYRP: 5.0000 mL | ORAL_SOLUTION | Freq: Every evening | ORAL | Status: DC | PRN

## 2018-11-16 MED ORDER — SODIUM CHLORIDE 0.9% FLUSH
3.0000 mL | Freq: Two times a day (BID) | INTRAVENOUS | Status: DC
  Administered 2018-11-16 – 2018-11-22 (×11): 3 mL via INTRAVENOUS

## 2018-11-16 MED ORDER — OXYBUTYNIN CHLORIDE ER 10 MG PO TB24
10.0000 mg | ORAL_TABLET | Freq: Every day | ORAL | Status: DC | PRN
  Administered 2018-11-16 – 2018-11-20 (×4): 10 mg via ORAL
  Filled 2018-11-16 (×5): qty 1

## 2018-11-16 MED ORDER — METOPROLOL TARTRATE 5 MG/5ML IV SOLN
5.0000 mg | Freq: Once | INTRAVENOUS | Status: AC
  Administered 2018-11-16: 5 mg via INTRAVENOUS
  Filled 2018-11-16: qty 5

## 2018-11-16 MED ORDER — ONDANSETRON HCL 4 MG/2ML IJ SOLN: 4.0000 mg | Freq: Four times a day (QID) | INTRAMUSCULAR | Status: DC | PRN

## 2018-11-16 MED ORDER — GUAIFENESIN-DM 100-10 MG/5ML PO SYRP
10.0000 mL | ORAL_SOLUTION | ORAL | Status: DC | PRN
  Administered 2018-11-16 – 2018-11-22 (×14): 10 mL via ORAL
  Filled 2018-11-16 (×15): qty 10

## 2018-11-16 MED ORDER — ENOXAPARIN SODIUM 60 MG/0.6ML ~~LOC~~ SOLN
60.0000 mg | SUBCUTANEOUS | Status: DC
  Administered 2018-11-16 – 2018-11-22 (×7): 60 mg via SUBCUTANEOUS
  Filled 2018-11-16 (×7): qty 0.6

## 2018-11-16 MED ORDER — SODIUM CHLORIDE 0.9 % IV SOLN: 250.0000 mL | INTRAVENOUS | Status: DC | PRN

## 2018-11-16 MED ORDER — INSULIN ASPART 100 UNIT/ML ~~LOC~~ SOLN
0.0000 [IU] | Freq: Three times a day (TID) | SUBCUTANEOUS | Status: DC
  Administered 2018-11-16: 11 [IU] via SUBCUTANEOUS
  Administered 2018-11-16: 15 [IU] via SUBCUTANEOUS
  Administered 2018-11-17: 3 [IU] via SUBCUTANEOUS
  Administered 2018-11-17: 4 [IU] via SUBCUTANEOUS
  Administered 2018-11-17: 3 [IU] via SUBCUTANEOUS
  Administered 2018-11-18: 7 [IU] via SUBCUTANEOUS
  Administered 2018-11-18: 4 [IU] via SUBCUTANEOUS
  Administered 2018-11-18 – 2018-11-19 (×3): 11 [IU] via SUBCUTANEOUS
  Administered 2018-11-19 – 2018-11-20 (×2): 4 [IU] via SUBCUTANEOUS
  Administered 2018-11-20: 7 [IU] via SUBCUTANEOUS
  Administered 2018-11-20 – 2018-11-21 (×3): 11 [IU] via SUBCUTANEOUS

## 2018-11-16 MED ORDER — ALPRAZOLAM 0.5 MG PO TABS
0.2500 mg | ORAL_TABLET | Freq: Two times a day (BID) | ORAL | Status: DC | PRN
  Administered 2018-11-16 – 2018-11-18 (×5): 0.25 mg via ORAL
  Filled 2018-11-16 (×6): qty 1

## 2018-11-16 MED ORDER — INSULIN ASPART 100 UNIT/ML ~~LOC~~ SOLN: 0.0000 [IU] | SUBCUTANEOUS | Status: DC

## 2018-11-16 MED ORDER — IRBESARTAN 300 MG PO TABS: 300.0000 mg | ORAL_TABLET | Freq: Every day | ORAL | Status: DC

## 2018-11-16 MED ORDER — ZINC SULFATE 220 (50 ZN) MG PO CAPS
220.0000 mg | ORAL_CAPSULE | Freq: Every day | ORAL | Status: DC
  Administered 2018-11-16 – 2018-11-22 (×7): 220 mg via ORAL
  Filled 2018-11-16 (×7): qty 1

## 2018-11-16 MED ORDER — ACETAMINOPHEN 325 MG PO TABS
650.0000 mg | ORAL_TABLET | Freq: Once | ORAL | Status: AC
  Administered 2018-11-16: 650 mg via ORAL
  Filled 2018-11-16: qty 2

## 2018-11-16 MED ORDER — INSULIN ASPART 100 UNIT/ML ~~LOC~~ SOLN
6.0000 [IU] | Freq: Three times a day (TID) | SUBCUTANEOUS | Status: DC
  Administered 2018-11-16 – 2018-11-18 (×7): 6 [IU] via SUBCUTANEOUS

## 2018-11-16 MED ORDER — MAGNESIUM SULFATE 2 GM/50ML IV SOLN
2.0000 g | Freq: Once | INTRAVENOUS | Status: AC
  Administered 2018-11-16: 2 g via INTRAVENOUS
  Filled 2018-11-16: qty 50

## 2018-11-16 MED ORDER — TRAMADOL-ACETAMINOPHEN 37.5-325 MG PO TABS: 1.0000 | ORAL_TABLET | Freq: Four times a day (QID) | ORAL | Status: DC | PRN

## 2018-11-16 MED ORDER — METHYLPREDNISOLONE SODIUM SUCC 125 MG IJ SOLR: 80.0000 mg | Freq: Two times a day (BID) | INTRAMUSCULAR | Status: DC

## 2018-11-16 MED ORDER — ONDANSETRON HCL 4 MG PO TABS: 4.0000 mg | ORAL_TABLET | Freq: Four times a day (QID) | ORAL | Status: DC | PRN

## 2018-11-16 MED ORDER — ATORVASTATIN CALCIUM 10 MG PO TABS: 20.0000 mg | ORAL_TABLET | Freq: Every day | ORAL | Status: DC

## 2018-11-16 MED ORDER — SODIUM CHLORIDE 0.9% FLUSH
3.0000 mL | Freq: Two times a day (BID) | INTRAVENOUS | Status: DC
  Administered 2018-11-16: 3 mL via INTRAVENOUS

## 2018-11-16 MED ORDER — ACETAMINOPHEN 500 MG PO TABS: 1000.0000 mg | ORAL_TABLET | Freq: Two times a day (BID) | ORAL | Status: DC | PRN

## 2018-11-16 MED ORDER — SODIUM CHLORIDE 0.9 % IV SOLN
200.0000 mg | Freq: Once | INTRAVENOUS | Status: AC
  Administered 2018-11-16: 200 mg via INTRAVENOUS
  Filled 2018-11-16: qty 40

## 2018-11-16 MED ORDER — ACETAMINOPHEN 325 MG PO TABS
650.0000 mg | ORAL_TABLET | Freq: Four times a day (QID) | ORAL | Status: DC | PRN
  Administered 2018-11-16 – 2018-11-21 (×5): 650 mg via ORAL
  Filled 2018-11-16 (×5): qty 2

## 2018-11-16 MED ORDER — PANTOPRAZOLE SODIUM 40 MG PO TBEC
40.0000 mg | DELAYED_RELEASE_TABLET | Freq: Every day | ORAL | Status: DC
  Administered 2018-11-16 – 2018-11-22 (×7): 40 mg via ORAL
  Filled 2018-11-16 (×7): qty 1

## 2018-11-16 MED ORDER — ALBUTEROL SULFATE HFA 108 (90 BASE) MCG/ACT IN AERS
2.0000 | INHALATION_SPRAY | Freq: Four times a day (QID) | RESPIRATORY_TRACT | Status: DC | PRN
  Filled 2018-11-16: qty 6.7

## 2018-11-16 MED ORDER — METFORMIN HCL 500 MG PO TABS: 1000.0000 mg | ORAL_TABLET | Freq: Two times a day (BID) | ORAL | Status: DC

## 2018-11-16 MED ORDER — MONTELUKAST SODIUM 10 MG PO TABS: 10.0000 mg | ORAL_TABLET | Freq: Every evening | ORAL | Status: DC | PRN

## 2018-11-16 MED ORDER — LORATADINE 10 MG PO TABS: 10.0000 mg | ORAL_TABLET | Freq: Every day | ORAL | Status: DC | PRN

## 2018-11-16 MED ORDER — HYDROCODONE-ACETAMINOPHEN 5-325 MG PO TABS
1.0000 | ORAL_TABLET | ORAL | Status: DC | PRN
  Administered 2018-11-18: 2 via ORAL
  Administered 2018-11-18: 1 via ORAL
  Administered 2018-11-19 – 2018-11-20 (×3): 2 via ORAL
  Filled 2018-11-16: qty 2
  Filled 2018-11-16: qty 1
  Filled 2018-11-16 (×4): qty 2

## 2018-11-16 MED ORDER — SODIUM CHLORIDE 0.9% FLUSH: 3.0000 mL | INTRAVENOUS | Status: DC | PRN

## 2018-11-16 MED ORDER — ALBUTEROL SULFATE HFA 108 (90 BASE) MCG/ACT IN AERS
1.0000 | INHALATION_SPRAY | Freq: Four times a day (QID) | RESPIRATORY_TRACT | Status: DC
  Administered 2018-11-16 – 2018-11-22 (×24): 1 via RESPIRATORY_TRACT
  Filled 2018-11-16: qty 6.7

## 2018-11-16 MED ORDER — DEXAMETHASONE SODIUM PHOSPHATE 10 MG/ML IJ SOLN
6.0000 mg | Freq: Every day | INTRAMUSCULAR | Status: DC
  Administered 2018-11-16 – 2018-11-17 (×2): 6 mg via INTRAVENOUS
  Filled 2018-11-16 (×2): qty 1

## 2018-11-16 MED ORDER — VITAMIN C 500 MG PO TABS
500.0000 mg | ORAL_TABLET | Freq: Every day | ORAL | Status: DC
  Administered 2018-11-16 – 2018-11-22 (×7): 500 mg via ORAL
  Filled 2018-11-16 (×7): qty 1

## 2018-11-16 MED ORDER — SODIUM CHLORIDE 0.9 % IV SOLN
100.0000 mg | INTRAVENOUS | Status: AC
  Administered 2018-11-17 – 2018-11-20 (×4): 100 mg via INTRAVENOUS
  Filled 2018-11-16 (×4): qty 20

## 2018-11-16 MED ORDER — METHYLPREDNISOLONE SODIUM SUCC 40 MG IJ SOLR: 40.0000 mg | Freq: Four times a day (QID) | INTRAMUSCULAR | Status: DC

## 2018-11-16 MED ORDER — INSULIN DETEMIR 100 UNIT/ML ~~LOC~~ SOLN
10.0000 [IU] | Freq: Every day | SUBCUTANEOUS | Status: DC
  Administered 2018-11-16 – 2018-11-18 (×3): 10 [IU] via SUBCUTANEOUS
  Filled 2018-11-16 (×3): qty 0.1

## 2018-11-16 MED ORDER — SALINE SPRAY 0.65 % NA SOLN
1.0000 | NASAL | Status: DC | PRN
  Administered 2018-11-16: 1 via NASAL
  Filled 2018-11-16: qty 44

## 2018-11-16 NOTE — Progress Notes (Signed)
PROGRESS NOTE    Almer Littleton  WTU:[id number] DOB: [birth year] DOA: 11/15/2018 PCP: Patient, No Pcp Per      Brief Narrative:  Mrs. Majette is a 68 y.o. F with DM, HTN, thrombocytopenia and recurring interstitial lung disease NOS who presents with 3 weeks cough, now 3-4 days noticeable SOB and wheezing.  In the ER, CXR with opacity, hypoxic and COVID+.         Assessment & Plan:  Coronavirus pneumonitis with acute hypoxic respiratory failure In setting of ongoing 2020 COVID-19 pandemic.  CXR with opacity, and hypoxic requiring 2L O2.  Procal negative. Probably not Actemra candidate given borderline platelets. -Continue remdesivir, day 1 of 5 -Continue Decadron day 1 -VTE PPx with Lovenox -Continue Zinc and Vitamin C     Lung disease This is poorly characterized.   Patient only able to provide general idea of her lung disease. CareEverywhere indicates extensive work up at [facility] back in2012-2013 (CT chest showing interstitial disease, echo that was normal, transbronch biopsy that showed non-granulomatous inflammation, chronic).  She was steroid responsive, and so a VATS biopsy was considered but deferred.  In 2019 last fall she was admitted here, CT lungs suggested hypersensitivity pneumonitis vs pHTN, echo was normal, and she was lost to follow up.  She describes an episodic recurring character to her lung disease, over years.  She is only mildly limited at baseline (SOB with moderate exertion, consistent with morbid obesity deconditioning).  She may be unaware of the extent of her hypoxia, however, as she describes typical SpO2 by home pulse ox in the upper 80s, and at times in the 60s.  She has clubbing.  She has PFTs described in her CareEverywhere notes that indicate a   Diabetes A1c 7.6%, good control -Hold metformin -Hold home 75/25 -Levemir daily -SSI corrections with mealtime insulin  Thrombocytopenia Stable relative to baseline  Hypertension -Hold home  metop  Morbid obesity Likely OHS       MDM and disposition: This is a no charge note, please see earlier documentation by my partner dr. Bobbye Morton.  DVT prophylaxis: Lovenox Code Status: FULL Family Communication: Sister by phone    Antimicrobials:   Ceftriaxone x1 8/1  Azithromycin x1 8/1   Culture data:   7/31 blood culture x2 -- NGTD  pending respiratory culture not collected yet      Subjective: Wheezing.  Objective: Vitals:   11/16/18 0825 11/16/18 0826 11/16/18 0827 11/16/18 0856  BP:    128/81  Pulse: 74 78 79   Resp: (!) 26 (!) 27 20 (!) 23  Temp:    98.4 F (36.9 C)  TempSrc:    Oral  SpO2: 92% 92% 94%   Weight:      Height:       No intake or output data in the 24 hours ending 11/16/18 1445 Filed Weights   11/15/18 2146  Weight: 117.9 kg    Examination:      Data Reviewed: I have personally reviewed following labs and imaging studies:  CBC: Recent Labs  Lab 11/15/18 2108 11/16/18 0048  WBC 3.9*  --   HGB 14.4 15.6*  HCT 45.1 46.0  MCV 96.8  --   PLT 65*  --    Basic Metabolic Panel: Recent Labs  Lab 11/15/18 2108 11/16/18 0024 11/16/18 0048  NA 136 135 139  K 4.5 5.4* 4.1  CL 102 104  --   CO2 22 20*  --   GLUCOSE 224* 227*  --  BUN 9 12  --   CREATININE 0.86 0.72  --   CALCIUM 9.6 9.3  --    GFR: Estimated Creatinine Clearance: 85 mL/min (by C-G formula based on SCr of 0.72 mg/dL). Liver Function Tests: Recent Labs  Lab 11/16/18 0024  AST 56*  ALT 21  ALKPHOS 58  BILITOT 1.1  PROT 6.2*  ALBUMIN 3.8   No results for input(s): LIPASE, AMYLASE in the last 168 hours. No results for input(s): AMMONIA in the last 168 hours. Coagulation Profile: No results for input(s): INR, PROTIME in the last 168 hours. Cardiac Enzymes: No results for input(s): CKTOTAL, CKMB, CKMBINDEX, TROPONINI in the last 168 hours. BNP (last 3 results) No results for input(s): PROBNP in the last 8760 hours. HbA1C: Recent Labs     11/16/18 0945  HGBA1C 7.6*   CBG: Recent Labs  Lab 11/16/18 0623 11/16/18 0902 11/16/18 1134  GLUCAP 275* 200* 284*   Lipid Profile: No results for input(s): CHOL, HDL, LDLCALC, TRIG, CHOLHDL, LDLDIRECT in the last 72 hours. Thyroid Function Tests: No results for input(s): TSH, T4TOTAL, FREET4, T3FREE, THYROIDAB in the last 72 hours. Anemia Panel: Recent Labs    11/16/18 0024  FERRITIN 276   Urine analysis: No results found for: COLORURINE, APPEARANCEUR, LABSPEC, PHURINE, GLUCOSEU, HGBUR, BILIRUBINUR, KETONESUR, PROTEINUR, UROBILINOGEN, NITRITE, LEUKOCYTESUR Sepsis Labs: _0 (procalcitonin:4,lacticacidven:4)  ) Recent Results (from the past 240 hour(s))  SARS Coronavirus 2 [facility] order, Performed in [facility] hospital lab) Nasopharyngeal Nasopharyngeal Swab     Status: Abnormal   Collection Time: 11/15/18  9:49 PM   Specimen: Nasopharyngeal Swab  Result Value Ref Range Status   SARS Coronavirus 2 POSITIVE (A) NEGATIVE Final    Comment: RESULT CALLED TO, READ BACK BY AND VERIFIED WITH: M. BROOKS,RN 2308 11/15/2018 T. TYSOR (NOTE) If result is NEGATIVE SARS-CoV-2 target nucleic acids are NOT DETECTED. The SARS-CoV-2 RNA is generally detectable in upper and lower  respiratory specimens during the acute phase of infection. The lowest  concentration of SARS-CoV-2 viral copies this assay can detect is 250  copies / mL. A negative result does not preclude SARS-CoV-2 infection  and should not be used as the sole basis for treatment or other  patient management decisions.  A negative result may occur with  improper specimen collection / handling, submission of specimen other  than nasopharyngeal swab, presence of viral mutation(s) within the  areas targeted by this assay, and inadequate number of viral copies  (<250 copies / mL). A negative result must be combined with clinical  observations, patient history, and epidemiological information. If result is POSITIVE  SARS-CoV-2 target nucleic acids are DETECTED.  The SARS-CoV-2 RNA is generally detectable in upper and lower  respiratory specimens during the acute phase of infection.  Positive  results are indicative of active infection with SARS-CoV-2.  Clinical  correlation with patient history and other diagnostic information is  necessary to determine patient infection status.  Positive results do  not rule out bacterial infection or co-infection with other viruses. If result is PRESUMPTIVE POSTIVE SARS-CoV-2 nucleic acids MAY BE PRESENT.   A presumptive positive result was obtained on the submitted specimen  and confirmed on repeat testing.  While 2019 novel coronavirus  (SARS-CoV-2) nucleic acids may be present in the submitted sample  additional confirmatory testing may be necessary for epidemiological  and / or clinical management purposes  to differentiate between  SARS-CoV-2 and other Sarbecovirus currently known to infect humans.  If clinically indicated additional testing with an  alternate test  methodology [phone])  is advised. The SARS-CoV-2 RNA is generally  detectable in upper and lower respiratory specimens during the acute  phase of infection. The expected result is Negative. Fact Sheet for Patients:  StrictlyIdeas.no Fact Sheet for Healthcare Providers: BankingDealers.co.za This test is not yet approved or cleared by the [place] FDA and has been authorized for detection and/or diagnosis of SARS-CoV-2 by FDA under an Emergency Use Authorization (EUA).  This EUA will remain in effect (meaning this test can be used) for the duration of the COVID-19 declaration under Section 564(b)(1) of the Act, 21 U.S.C. section 360bbb-3(b)(1), unless the authorization is terminated or revoked sooner. Performed at [facility], [place] [address] [place], [place] [address]          Radiology Studies: Dg Chest Portable 1 View   Result Date: 11/15/2018 CLINICAL DATA:  Shortness of breath and cough. EXAM: PORTABLE CHEST 1 VIEW COMPARISON:  December 17, 2017 FINDINGS: The heart size is enlarged. The lung volumes are low. There are prominent interstitial lung markings bilaterally. There are a few ground-glass airspace opacities bilaterally. There is no pneumothorax. There is no definite large pleural effusion. No acute osseous abnormality. IMPRESSION: Findings concerning for an atypical infectious process versus pulmonary edema. Electronically Signed   By: Constance Holster M.D.   On: 11/15/2018 21:35        Scheduled Meds: . albuterol  1 puff Inhalation Q6H  . dexamethasone (DECADRON) injection  6 mg Intravenous Daily  . enoxaparin (LOVENOX) injection  60 mg Subcutaneous Q24H  . insulin aspart  0-20 Units Subcutaneous TID WC  . insulin aspart  6 Units Subcutaneous TID WC  . insulin detemir  10 Units Subcutaneous Daily  . pantoprazole  40 mg Oral Daily  . sodium chloride flush  3 mL Intravenous Q12H  . vitamin C  500 mg Oral Daily  . zinc sulfate  220 mg Oral Daily   Continuous Infusions: . [START ON 11/17/2018] remdesivir 100 mg in NS 250 mL       LOS: 1 day    Time spent: 25 minutes      Edwin Dada, MD [facility] 11/16/2018, 2:45 PM     Please page through [place]:  [email or web address] Password [email or web address] If 7PM-7AM, please contact night-coverage

## 2018-11-16 NOTE — Progress Notes (Signed)
PROGRESS NOTE  Kathy Irwin AOZ:[id number] DOB: May 04, 1950 DOA: 11/15/2018 PCP: Patient, No Pcp Per  Brief History    68 year old African-American female Type 2 diabetes mellitus hypertension BMI >96 diastolic heart failure EF 60-65% 12/2017 Prior acute hypoxic respiratory failure with nonspecific groundglass opacification?  Pulmonary hypertension?  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 12 for now Initiate remdesivir at [facility], ED if does not go over to [facility] within next 1 to 2 hours Recheck biomarker CRP d-dimer and ferritin a.m. or if significant worsening Advised the patient to sit prone 16 hours a day and will allow breakfast Started on azithromycin and ceftriaxone and depending on repeat chest x-ray and procalcitonin may continue I will order those labs for a.m. Hypertension Continue Benicar 40 as a substitution-moderately controlled Diabetes mellitus type 2 Resume metformin every morning breakfast 1000-expect blood sugar will not be well controlled with steroids-Place on sliding scale resistant coverage for now and add 10 units of Levemir Diastolic heart failure 29-52% Prior history of hypersensitivity pneumonitis versus asthma Uses inhalers at home and has a pulse ox which is how she noticed her oxygen levels were in the 80s We will continue pro-air, other meds As she has lower extremity edema would use a limited volume strategy and not give too much IV saline would keep her KVO Hyperlipidemia Hold statin   DVT prophylaxis-Lovenox once daily Code Status: Presumed full Family Communication: Have not  spoken with family however she indicates [name] is the one that would need to be spoken with Disposition Plan: Probably to [place] when able   Verneita Griffes, MD [facility] 7:37 AM  11/16/2018, 7:37 AM  LOS: 1 day   Consultants  . Discussed with Dr. Lake Bells  Procedures  . None  Antibiotics  . Ceftriaxone azithromycin at this time  Interval History/Subjective  Feels well, sitting up in bed, does not feel short of breath on oxygen, no fever, no chills No sputum   Objective   Vitals:  Vitals:   11/16/18 0545 11/16/18 0600  BP:  135/71  Pulse: 67 68  Resp: (!) 21 (!) 21  Temp:    SpO2: 95% 93%    Exam: Obese awake alert no icterus no pallor Thick neck Mallampati 4 No thyromegaly Bilateral decreased air entry however no wheeze no rails Abdomen soft nontender cannot really assess Trace lower extremity edema  neurologically intact with no focal deficit psych euthymic although little nervous   I have personally reviewed the following:   Today's Data  . As below  Lab Data  . Ferritin 276, dimer 0.45, fibrinogen 327, ESR 1, LDH 480, lactic acid 2.3 . Hemoglobin 15, sodium 139 potassium 4.1  Micro Data  .   Imaging  . CXR atypical process versus pulmonary edema  Cardiology Data  . None  Other Data  . No  Scheduled Meds: . ipratropium  2 puff Inhalation Once  . ipratropium  2 puff Inhalation Once   Continuous Infusions: . azithromycin Stopped (11/16/18 0354)  . cefTRIAXone (ROCEPHIN)  IV Stopped (11/16/18 0100)    Active Problems:   DM (diabetes mellitus) ([place])   HTN (  hypertension)   Acute respiratory failure with hypoxia (HCC)   Thrombocytopenia (HCC)   Pneumonia due to COVID-19 virus   LOS: 1 day   How to contact the [facility] Attending or Consulting provider [place] or covering provider during after hours [place], for this patient?  1. Check the care team in [facility] and look for a) attending/consulting TRH provider listed and b) the [facility]  team listed 2. Log into [email or web address] and use [place]'s universal password to access. If you do not have the password, please contact the hospital operator. 3. Locate the [facility] provider you are looking for under [facility] and page to a number that you can be directly reached. 4. If you still have difficulty reaching the provider, please page the [facility] (Director on Call) for the Hospitalists listed on amion for assistance.

## 2018-11-16 NOTE — ED Notes (Signed)
Attending Samtani MD at bedside

## 2018-11-16 NOTE — ED Notes (Signed)
LA 2.3

## 2018-11-16 NOTE — ED Notes (Signed)
ED TO INPATIENT HANDOFF REPORT  ED Nurse Name and Phone #:  [phone]  S Name/Age/Gender Kathy Irwin 68 y.o. female Room/Bed: 025C/025C  Code Status   Code Status: Prior  Home/SNF/Other Home Patient oriented to: self, place, time and situation Is this baseline? Yes   Triage Complete: Triage complete  Chief Complaint SOB  Triage Note Pt reports worsening cough and shortness of breath over the past few days, pt taking prednisone with little relief. Pt 85% on room air in triage, 2L [place] applied   Allergies Allergies  Allergen Reactions  . Other Shortness Of Breath and Other (See Comments)    Newspaper ink =  new chest pain, also  . Iodine Other (See Comments)    "Was a long time ago" (Reaction??)  . Merbromin Other (See Comments)    Mercurochrome- "Was a long time ago" (Reaction??)    Level of Care/Admitting Diagnosis ED Disposition    ED Disposition Condition [place] [facility] Area: [place] [[id number]]  Level of Care: Progressive [102]  Covid Evaluation: Confirmed COVID Positive  Diagnosis: Pneumonia due to COVID-19 virus [[id number]]  Admitting Physician: Toy Baker [[id number]]  Attending Physician: Toy Baker [[id number]]  Estimated length of stay: 3 - 4 days  Certification:: I certify this patient will need inpatient services for at least 2 midnights  PT Class (Do Not Modify): Inpatient [101]  PT Acc Code (Do Not Modify): Private [1]       B Medical/Surgery History Past Medical History:  Diagnosis Date  . Diabetes mellitus without complication ([place])   . Hypersensitivity pneumonitis ([place]) 12/19/2017  . Hypertension    History reviewed. No pertinent surgical history.   A IV Location/Drains/Wounds Patient Lines/Drains/Airways Status   Active Line/Drains/Airways    Name:   Placement date:   Placement time:   Site:   Days:   Peripheral IV 11/15/18 Right Antecubital   11/15/18    2203    Antecubital   1   External Urinary  Catheter   11/15/18    2214    -   1          Intake/Output Last 24 hours No intake or output data in the 24 hours ending 11/16/18 0120  Labs/Imaging Results for orders placed or performed during the hospital encounter of 11/15/18 (from the past 48 hour(s))  Basic metabolic panel     Status: Abnormal   Collection Time: 11/15/18  9:08 PM  Result Value Ref Range   Sodium 136 135 - 145 mmol/L   Potassium 4.5 3.5 - 5.1 mmol/L   Chloride 102 98 - 111 mmol/L   CO2 22 22 - 32 mmol/L   Glucose, Bld 224 (H) 70 - 99 mg/dL   BUN 9 8 - 23 mg/dL   Creatinine, Ser 0.86 0.44 - 1.00 mg/dL   Calcium 9.6 8.9 - 10.3 mg/dL   GFR calc non Af Amer >60 >60 mL/min   GFR calc Af Amer >60 >60 mL/min   Anion gap 12 5 - 15    Comment: Performed at [facility], [place] [address], [place] [address]  CBC     Status: Abnormal   Collection Time: 11/15/18  9:08 PM  Result Value Ref Range   WBC 3.9 (L) 4.0 - 10.5 K/uL   RBC 4.66 3.87 - 5.11 MIL/uL   Hemoglobin 14.4 12.0 - 15.0 g/dL   HCT 45.1 36.0 - 46.0 %   MCV 96.8 80.0 - 100.0  fL   MCH 30.9 26.0 - 34.0 pg   MCHC 31.9 30.0 - 36.0 g/dL   RDW 21.2 (H) 11.5 - 15.5 %   Platelets 65 (L) 150 - 400 K/uL    Comment: REPEATED TO VERIFY PLATELET COUNT CONFIRMED BY SMEAR SPECIMEN CHECKED FOR CLOTS Immature Platelet Fraction may be clinically indicated, consider ordering this additional test [id number]    nRBC 4.4 (H) 0.0 - 0.2 %    Comment: Performed at [facility] [address] [place], [place] [address]  Brain natriuretic peptide     Status: None   Collection Time: 11/15/18  9:08 PM  Result Value Ref Range   B Natriuretic Peptide 74.4 0.0 - 100.0 pg/mL    Comment: Performed at [facility] [address] [place], [place] [address]  SARS Coronavirus 2 [facility] order, Performed in [facility] hospital lab) Nasopharyngeal Nasopharyngeal Swab     Status: Abnormal   Collection Time: 11/15/18  9:49 PM   Specimen: Nasopharyngeal  Swab  Result Value Ref Range   SARS Coronavirus 2 POSITIVE (A) NEGATIVE    Comment: RESULT CALLED TO, READ BACK BY AND VERIFIED WITH: M. [place] Wenk,RN 2308 11/15/2018 T. TYSOR (NOTE) If result is NEGATIVE SARS-CoV-2 target nucleic acids are NOT DETECTED. The SARS-CoV-2 RNA is generally detectable in upper and lower  respiratory specimens during the acute phase of infection. The lowest  concentration of SARS-CoV-2 viral copies this assay can detect is 250  copies / mL. A negative result does not preclude SARS-CoV-2 infection  and should not be used as the sole basis for treatment or other  patient management decisions.  A negative result may occur with  improper specimen collection / handling, submission of specimen other  than nasopharyngeal swab, presence of viral mutation(s) within the  areas targeted by this assay, and inadequate number of viral copies  (<250 copies / mL). A negative result must be combined with clinical  observations, patient history, and epidemiological information. If result is POSITIVE SARS-CoV-2 target nucleic acids are DETECTED.  The SARS-CoV-2 RNA is generally detectable in upper and lower  respiratory specimens during the acute phase of infection.  Positive  results are indicative of active infection with SARS-CoV-2.  Clinical  correlation with patient history and other diagnostic information is  necessary to determine patient infection status.  Positive results do  not rule out bacterial infection or co-infection with other viruses. If result is PRESUMPTIVE POSTIVE SARS-CoV-2 nucleic acids MAY BE PRESENT.   A presumptive positive result was obtained on the submitted specimen  and confirmed on repeat testing.  While 2019 novel coronavirus  (SARS-CoV-2) nucleic acids may be present in the submitted sample  additional confirmatory testing may be necessary for epidemiological  and / or clinical management purposes  to differentiate between  SARS-CoV-2 and other  Sarbecovirus currently known to infect humans.  If clinically indicated additional testing with an alternate test  methodology [phone])  is advised. The SARS-CoV-2 RNA is generally  detectable in upper and lower respiratory specimens during the acute  phase of infection. The expected result is Negative. Fact Sheet for Patients:  StrictlyIdeas.no Fact Sheet for Healthcare Providers: BankingDealers.co.za This test is not yet approved or cleared by the [place] FDA and has been authorized for detection and/or diagnosis of SARS-CoV-2 by FDA under an Emergency Use Authorization (EUA).  This EUA will remain in effect (meaning this test can be used) for the duration of the COVID-19 declaration under Section 564(b)(1)  of the Act, 21 U.S.C. section 360bbb-3(b)(1), unless the authorization is terminated or revoked sooner. Performed at [facility], [place] [address] [place], [place] [address]   Lactic acid, plasma     Status: Abnormal   Collection Time: 11/15/18 10:51 PM  Result Value Ref Range   Lactic Acid, Venous 2.4 (HH) 0.5 - 1.9 mmol/L    Comment: CRITICAL RESULT CALLED TO, READ BACK BY AND VERIFIED WITH: Marquail Bradwell M,RN 11/15/18 2340 WAYK Performed at [facility], [place] [address] [place],  [address]   Procalcitonin     Status: None   Collection Time: 11/15/18 10:51 PM  Result Value Ref Range   Procalcitonin <0.10 ng/mL    Comment:        Interpretation: PCT (Procalcitonin) <= 0.5 ng/mL: Systemic infection (sepsis) is not likely. Local bacterial infection is possible. (NOTE)       Sepsis PCT Algorithm           Lower Respiratory Tract                                      Infection PCT Algorithm    ----------------------------     ----------------------------         PCT < 0.25 ng/mL                PCT < 0.10 ng/mL         Strongly encourage             Strongly discourage   discontinuation of antibiotics     initiation of antibiotics    ----------------------------     -----------------------------       PCT 0.25 - 0.50 ng/mL            PCT 0.10 - 0.25 ng/mL               OR       >80% decrease in PCT            Discourage initiation of                                            antibiotics      Encourage discontinuation           of antibiotics    ----------------------------     -----------------------------         PCT >= 0.50 ng/mL              PCT 0.26 - 0.50 ng/mL               AND        <80% decrease in PCT             Encourage initiation of                                             antibiotics       Encourage continuation           of antibiotics    ----------------------------     -----------------------------        PCT >= 0.50 ng/mL  PCT > 0.50 ng/mL               AND         increase in PCT                  Strongly encourage                                      initiation of antibiotics    Strongly encourage escalation           of antibiotics                                     -----------------------------                                           PCT <= 0.25 ng/mL                                                 OR                                        > 80% decrease in PCT                                     Discontinue / Do not initiate                                             antibiotics Performed at [facility], [address], [place], [place] [address]   C-reactive protein     Status: Abnormal   Collection Time: 11/15/18 11:34 PM  Result Value Ref Range   CRP 1.6 (H) <1.0 mg/dL    Comment: Performed at [facility] [address], [place], [place] [address]  POCT I-Stat EG7     Status: Abnormal   Collection Time: 11/16/18 12:48 AM  Result Value Ref Range   pH, Ven 7.422 7.250 - 7.430   pCO2, Ven 39.2 (L) 44.0 - 60.0 mmHg   pO2, Ven 54.0 (H) 32.0 - 45.0 mmHg   Bicarbonate 25.5 20.0 - 28.0 mmol/L   TCO2 27 22 - 32 mmol/L    O2 Saturation 88.0 %   Acid-Base Excess 1.0 0.0 - 2.0 mmol/L   Sodium 139 135 - 145 mmol/L   Potassium 4.1 3.5 - 5.1 mmol/L   Calcium, Ion 1.21 1.15 - 1.40 mmol/L   HCT 46.0 36.0 - 46.0 %   Hemoglobin 15.6 (H) 12.0 - 15.0 g/dL   Patient temperature HIDE    Sample type VENOUS    Dg Chest Portable 1 View  Result Date: 11/15/2018 CLINICAL DATA:  Shortness of breath and cough. EXAM: PORTABLE CHEST 1 VIEW COMPARISON:  December 17, 2017 FINDINGS: The heart size is enlarged. The  lung volumes are low. There are prominent interstitial lung markings bilaterally. There are a few ground-glass airspace opacities bilaterally. There is no pneumothorax. There is no definite large pleural effusion. No acute osseous abnormality. IMPRESSION: Findings concerning for an atypical infectious process versus pulmonary edema. Electronically Signed   By: Constance Holster M.D.   On: 11/15/2018 21:35    Pending Labs Unresulted Labs (From admission, onward)    Start     Ordered   11/15/18 2338  Culture, sputum-assessment  Once,   R     11/15/18 2338   11/15/18 2337  D-dimer, quantitative (not at [facility])  Add-on,   AD     11/15/18 2338   11/15/18 2337  Ferritin  Once,   STAT     11/15/18 2338   11/15/18 2337  Fibrinogen  Add-on,   AD     11/15/18 2338   11/15/18 2337  Interleukin-6, Plasma  Add-on,   AD     11/15/18 2338   11/15/18 2337  Lactate dehydrogenase  Add-on,   AD     11/15/18 2338   11/15/18 2337  Sedimentation rate  Add-on,   AD     11/15/18 2338   11/15/18 2327  CBC with Differential/Platelet  Add-on,   AD     11/15/18 2326   11/15/18 2240  Lactic acid, plasma  Now then every 2 hours,   STAT     11/15/18 2239   11/15/18 2240  Blood Culture (routine x 2)  BLOOD CULTURE X 2,   STAT     11/15/18 2239   Signed and Held  Hemoglobin A1c  Once,   R    Comments: To assess prior glycemic control    Signed and Held   Signed and Held  ABO/Rh  Once,   R     Signed and Held   Signed and Held  CBC with  Differential/Platelet  Daily,   R     Signed and Held   Signed and Held  Comprehensive metabolic panel  Daily,   R     Signed and Held   Signed and Held  C-reactive protein  Daily,   R     Signed and Held   Signed and Held  CK  Daily,   R     Signed and Held   Signed and Held  D-dimer, quantitative (not at [facility])  Daily,   R     Signed and Held   Signed and Held  Ferritin  Daily,   R     Signed and Held   Signed and Held  Interleukin-6, Plasma  Daily,   R     Signed and Held   Signed and Held  Magnesium  Daily,   R     Signed and Held   Signed and Held  Strep pneumoniae urinary antigen  Once,   R     Signed and Held          Vitals/Pain Today's Vitals   11/16/18 0015 11/16/18 0030 11/16/18 0045 11/16/18 0100  BP:   (!) 153/73 (!) 145/63  Pulse: (!) 108  (!) 106 (!) 107  Resp: (!) 27 (!) 34 (!) 32 (!) 26  Temp:      TempSrc:      SpO2: 92%  94% 91%  Weight:      Height:      PainSc:        Isolation Precautions Airborne and Contact precautions  Medications Medications  ipratropium (ATROVENT HFA) inhaler 2 puff (has no administration in time range)  cefTRIAXone (ROCEPHIN) 2 g in sodium chloride 0.9 % 100 mL IVPB (0 g Intravenous Stopped 11/16/18 0100)  azithromycin (ZITHROMAX) 500 mg in sodium chloride 0.9 % 250 mL IVPB (500 mg Intravenous New Bag/Given 11/16/18 0101)  ipratropium (ATROVENT HFA) inhaler 2 puff (has no administration in time range)  ALPRAZolam (XANAX) tablet 0.25 mg (has no administration in time range)  sodium chloride flush (NS) 0.9 % injection 3 mL (3 mLs Intravenous Given 11/15/18 2145)  albuterol (VENTOLIN HFA) 108 (90 Base) MCG/ACT inhaler 6 puff (6 puffs Inhalation Given 11/15/18 2137)  methylPREDNISolone sodium succinate (SOLU-MEDROL) 125 mg/2 mL injection 125 mg (125 mg Intravenous Given 11/15/18 2145)  albuterol (VENTOLIN HFA) 108 (90 Base) MCG/ACT inhaler 4 puff (4 puffs Inhalation Given 11/15/18 2305)  acetaminophen (TYLENOL) tablet 650 mg (650 mg  Oral Given 11/16/18 0101)    Mobility walks with device Low fall risk   Focused Assessments Pulmonary Assessment Handoff:  Lung sounds: Bilateral Breath Sounds: Diminished, Coarse crackles O2 Device: Nasal Cannula O2 Flow Rate (L/min): 6 L/min      R Recommendations: See Admitting Provider Note  Report given to:   Additional Notes:

## 2018-11-16 NOTE — ED Notes (Signed)
Pt is being transported to [facility] at this time.

## 2018-11-16 NOTE — Progress Notes (Signed)
HR and O2-requirements have increased. HR 130's, with SBP 150's, appears to be sinus. She had mild hyperkalemia last night that normalized. Her home metoprolol has been held.   Plan to check EKG and chemistries, give a dose of metoprolol, continue to monitor.

## 2018-11-16 NOTE — Progress Notes (Signed)
Remdesivir - Pharmacy Brief Note   O:  ALT: 18 CXR: (7/31) Findings concerning for an atypical infectious process versus pulmonary edema. SpO2: 93% on 6L [place]   A/P:  Patient meets criteria for remdesivir. Will initiate remdesivir 200 mg once followed by 100 mg daily x 4 days.   Gretta Arab PharmD, BCPS Clinical pharmacist phone 7am- 5pm: [phone] 11/16/2018 7:47 AM

## 2018-11-16 NOTE — ED Notes (Signed)
Nurse will collect trop

## 2018-11-16 NOTE — Progress Notes (Signed)
Dr. Myna Hidalgo paged and made aware pt is sustained sinus tachy at rest 130s- 140s. Pt pulse ox reading 80s on 2L of oxygen on nasal cannula, desats to 70s with exertion. Pt increased to 5L of nasal cannula, oxygen saturation now reading 91% at rest. Respiratory called to assess for possible transfer of O2 device to HFNC.

## 2018-11-16 NOTE — ED Notes (Signed)
Daughter- [name] [phone]

## 2018-11-16 NOTE — ED Notes (Signed)
Called [facility]- will be after 3am

## 2018-11-17 LAB — CBC
HCT: 43.1 % (ref 36.0–46.0)
Hemoglobin: 13.4 g/dL (ref 12.0–15.0)
MCH: 30.3 pg (ref 26.0–34.0)
MCHC: 31.1 g/dL (ref 30.0–36.0)
MCV: 97.5 fL (ref 80.0–100.0)
Platelets: 62 10*3/uL — ABNORMAL LOW (ref 150–400)
RBC: 4.42 MIL/uL (ref 3.87–5.11)
RDW: 21.5 % — ABNORMAL HIGH (ref 11.5–15.5)
WBC: 3.6 10*3/uL — ABNORMAL LOW (ref 4.0–10.5)
nRBC: 2.3 % — ABNORMAL HIGH (ref 0.0–0.2)

## 2018-11-17 LAB — GLUCOSE, CAPILLARY
Glucose-Capillary: 122 mg/dL — ABNORMAL HIGH (ref 70–99)
Glucose-Capillary: 124 mg/dL — ABNORMAL HIGH (ref 70–99)
Glucose-Capillary: 127 mg/dL — ABNORMAL HIGH (ref 70–99)
Glucose-Capillary: 132 mg/dL — ABNORMAL HIGH (ref 70–99)
Glucose-Capillary: 188 mg/dL — ABNORMAL HIGH (ref 70–99)
Glucose-Capillary: 249 mg/dL — ABNORMAL HIGH (ref 70–99)
Glucose-Capillary: 262 mg/dL — ABNORMAL HIGH (ref 70–99)

## 2018-11-17 LAB — C-REACTIVE PROTEIN: CRP: 4.7 mg/dL — ABNORMAL HIGH (ref <1.0)

## 2018-11-17 LAB — PROCALCITONIN: Procalcitonin: <0.1 ng/mL

## 2018-11-17 MED ORDER — METOPROLOL SUCCINATE ER 100 MG PO TB24
200.0000 mg | ORAL_TABLET | Freq: Every day | ORAL | Status: DC
  Administered 2018-11-17 – 2018-11-18 (×2): 200 mg via ORAL
  Filled 2018-11-17 (×3): qty 2

## 2018-11-17 MED ORDER — SODIUM CHLORIDE 0.9 % IV BOLUS
250.0000 mL | Freq: Once | INTRAVENOUS | Status: AC
  Administered 2018-11-17: 250 mL via INTRAVENOUS

## 2018-11-17 MED ORDER — TOCILIZUMAB 400 MG/20ML IV SOLN
800.0000 mg | Freq: Once | INTRAVENOUS | Status: AC
  Administered 2018-11-17: 800 mg via INTRAVENOUS
  Filled 2018-11-17: qty 40

## 2018-11-17 MED ORDER — PHENOL 1.4 % MT LIQD
1.0000 | OROMUCOSAL | Status: DC | PRN
  Filled 2018-11-17: qty 177

## 2018-11-17 MED ORDER — METHYLPREDNISOLONE SODIUM SUCC 40 MG IJ SOLR
40.0000 mg | Freq: Three times a day (TID) | INTRAMUSCULAR | Status: DC
  Administered 2018-11-17 – 2018-11-20 (×9): 40 mg via INTRAVENOUS
  Filled 2018-11-17 (×9): qty 1

## 2018-11-17 NOTE — Plan of Care (Signed)
NSR. O2 sats 90-94 on 7L [facility].

## 2018-11-17 NOTE — Progress Notes (Signed)
RT to assess pt per MD request. Pt denies SOB, no increased WOB at this time. VS within normal limits. Pt on 6L [facility] at this time. RT to monitor.

## 2018-11-17 NOTE — Progress Notes (Signed)
Dr. Myna Hidalgo made aware of the patient's elevated temp. Meds given, plan to recheck vitals inculding temp. Fluids ordered by provider and given.

## 2018-11-17 NOTE — Progress Notes (Addendum)
PROGRESS NOTE    Kathy Irwin  IZT:[id number] DOB: Oct 31, 1950 DOA: 11/15/2018 PCP: Patient, No Pcp Per      Brief Narrative:  Kathy Irwin is a 68 y.o. F with DM, HTN, thrombocytopenia and recurring interstitial lung disease NOS who presents with 3 weeks cough, now 3-4 days noticeable SOB and wheezing.  In the ER, CXR with opacity, hypoxic and COVID+.         Assessment & Plan:  Coronavirus pneumonitis with acute hypoxic respiratory failure In setting of ongoing 2020 COVID-19 pandemic.  Procal negative on admission. Attention to platelet count prior to Actemra administration.  Overnight, hypoxia worsened noticeably. -Continue remdesivir, day 2 of 5 -Continue steroids, day 3, increase to Solu-medrol TID -Continue VTE PPx with Lovenox -Continue Zinc and Vitamin C  Regarding off-label use of Actemra: This patient has confirmed COVID-19 in the setting of the ongoing 2020 coronavirus pandemic.  He has hypoxia and is high-risk for intubation (due to age, BMI >35 and rapidly escalating O2 needs), but expected to survive >48 hours and has good baseline functional status.  She is not on immunomodulators, anti-rejection medications, or cancer chemotherapy, has no history of TB or latent TB, and no history of diverticulitis or intestinal perforation.  Platelets are >50K, ANC is >500, and ALT/AST are below 5x ULN with no known hepatitis B infection. The off-label use of this medication was discussed with the patient/HCPOA and they choose to proceed as the potential benefits are felt to outweigh risks at this time.  -Tocilizumab 8 mg/kg now -Monitor for infusion reaction -Daily LFTs    Lung disease This is poorly characterized.   Patient only able to provide general idea of her lung disease. CareEverywhere indicates extensive work up at [facility] back in 2012-2013 (CT chest showing interstitial disease, echo that was normal, transbronch biopsy that showed non-granulomatous inflammation,  chronic).  She was steroid responsive, and so a VATS biopsy was considered but deferred.  In 2019 last fall she was admitted here, CT lungs suggested hypersensitivity pneumonitis vs pHTN, echo was normal, and she was lost to follow up.  She describes an episodic recurring character to her lung disease, over years.  She is only mildly limited at baseline (SOB with moderate exertion, consistent with morbid obesity deconditioning).  She may be unaware of the extent of her hypoxia, however, as she describes typical SpO2 by home pulse ox in the upper 80s, and at times in the 60s.  She has clubbing.  She has PFTs described in her CareEverywhere notes that indicate a   Diabetes A1c 7.6%.  Glucoses well controlled -Hold metformin, home 75/25 -Continue Levemir -Continue SSI corrections with mealtime insulin  Thrombocytopenia Stable relative to baseline -Daily CBC  Hypertension -Resume metoprolol  Morbid obesity Likely OHS       MDM and disposition: The below labs and imaging reports reviewed and summarized above.  Medication management as above.  The patient was admitted with COVID-19.  She has worsening respiratory failure, and is now requiring seven liters supplemental oxygen.  Her mentation is good.    DVT prophylaxis: Lovenox Code Status: FULL Family Communication:  Sister by phone    Antimicrobials:   Ceftriaxone x1 7/31  Azithromycin x1 7/31   Culture data:   7/31 blood culture x2 -- NGTD  pending respiratory culture not collected yet      Subjective: She has urinary incontinence which is chronic.  She has a bad cough.  She had a fever this morning, but  since then she has been feeling better.  No vomiting, confusion.  Objective: Vitals:   11/17/18 0637 11/17/18 0720 11/17/18 0800 11/17/18 1000  BP:  140/73 120/64   Pulse: (!) 109 (!) 114 (!) 118   Resp: (!) 33 (!) 31 17   Temp: 98.9 F (37.2 C)   99.6 F (37.6 C)  TempSrc: Oral   Oral  SpO2: 93% 96%  95%   Weight:      Height:        Intake/Output Summary (Last 24 hours) at 11/17/2018 1145 Last data filed at 11/17/2018 0430 Gross per 24 hour  Intake 510 ml  Output -  Net 510 ml   Filed Weights   11/15/18 2146  Weight: 117.9 kg    Examination:   General: Obese adult female, sitting in recliner, no acute distress, appears tired.    HEENT: Corneas clear, conjunctivae and sclerae normal without injection or icterus, lids and lashes normal.  Visual tracking smooth.  OP moist without erythema, exudates, cobblestoning, or ulcers.    Cardiac: RRR, nl S1-S2, no murmurs, rubs, gallops.   JVP not visible, no lower extremity edema. Respiratory: Slightly tachypneic, lung sounds diminished bilaterally.  No wheezes or rales. Abdomen: No tenderness to palpation or guarding. Extremities: Clubbing noted.  Extremities are warm and well perfused. Neuro: Sensorium intact.  Speech is fluent.  Naming is grossly intact, and the patient's recall, recent and remote, as well as general fund of knowledge seem within normal limits.  Muscle tone normal, without fasciculations.  Moves all extremities equally and with normal coordination.  Attention span and concentration are within normal limits.  Psych:  full range of affect.  Normal rate and rhythm of speech.  Thought content appropriate, and thought process linear.  No SI/HI, aural or visual hallucinations or delusions. Attention and concentration are normal.       Data Reviewed: I have personally reviewed following labs and imaging studies:  CBC: Recent Labs  Lab 11/15/18 2108 11/16/18 0048 11/17/18 0815  WBC 3.9*  --  3.6*  HGB 14.4 15.6* 13.4  HCT 45.1 46.0 43.1  MCV 96.8  --  97.5  PLT 65*  --  62*   Basic Metabolic Panel: Recent Labs  Lab 11/15/18 2108 11/16/18 0024 11/16/18 0048 11/16/18 2040  NA 136 135 139 140  K 4.5 5.4* 4.1 4.5  CL 102 104  --  104  CO2 22 20*  --  28  GLUCOSE 224* 227*  --  217*  BUN 9 12  --  17  CREATININE  0.86 0.72  --  0.81  CALCIUM 9.6 9.3  --  10.1  MG  --   --   --  1.7   GFR: Estimated Creatinine Clearance: 84 mL/min (by C-G formula based on SCr of 0.81 mg/dL). Liver Function Tests: Recent Labs  Lab 11/16/18 0024  AST 56*  ALT 21  ALKPHOS 58  BILITOT 1.1  PROT 6.2*  ALBUMIN 3.8   No results for input(s): LIPASE, AMYLASE in the last 168 hours. No results for input(s): AMMONIA in the last 168 hours. Coagulation Profile: No results for input(s): INR, PROTIME in the last 168 hours. Cardiac Enzymes: No results for input(s): CKTOTAL, CKMB, CKMBINDEX, TROPONINI in the last 168 hours. BNP (last 3 results) No results for input(s): PROBNP in the last 8760 hours. HbA1C: Recent Labs    11/16/18 0945  HGBA1C 7.6*   CBG: Recent Labs  Lab 11/16/18 1956 11/16/18 2127 11/17/18 0000 11/17/18  0414 11/17/18 0801  GLUCAP 230* 182* 127* 124* 122*   Lipid Profile: No results for input(s): CHOL, HDL, LDLCALC, TRIG, CHOLHDL, LDLDIRECT in the last 72 hours. Thyroid Function Tests: No results for input(s): TSH, T4TOTAL, FREET4, T3FREE, THYROIDAB in the last 72 hours. Anemia Panel: Recent Labs    11/16/18 0024  FERRITIN 276   Urine analysis: No results found for: COLORURINE, APPEARANCEUR, LABSPEC, PHURINE, GLUCOSEU, HGBUR, BILIRUBINUR, KETONESUR, PROTEINUR, UROBILINOGEN, NITRITE, LEUKOCYTESUR Sepsis Labs: _0 (procalcitonin:4,lacticacidven:4)  ) Recent Results (from the past 240 hour(s))  SARS Coronavirus 2 [facility] order, Performed in [facility] hospital lab) Nasopharyngeal Nasopharyngeal Swab     Status: Abnormal   Collection Time: 11/15/18  9:49 PM   Specimen: Nasopharyngeal Swab  Result Value Ref Range Status   SARS Coronavirus 2 POSITIVE (A) NEGATIVE Final    Comment: RESULT CALLED TO, READ BACK BY AND VERIFIED WITH: M. BROOKS,RN 2308 11/15/2018 T. TYSOR (NOTE) If result is NEGATIVE SARS-CoV-2 target nucleic acids are NOT DETECTED. The SARS-CoV-2 RNA is  generally detectable in upper and lower  respiratory specimens during the acute phase of infection. The lowest  concentration of SARS-CoV-2 viral copies this assay can detect is 250  copies / mL. A negative result does not preclude SARS-CoV-2 infection  and should not be used as the sole basis for treatment or other  patient management decisions.  A negative result may occur with  improper specimen collection / handling, submission of specimen other  than nasopharyngeal swab, presence of viral mutation(s) within the  areas targeted by this assay, and inadequate number of viral copies  (<250 copies / mL). A negative result must be combined with clinical  observations, patient history, and epidemiological information. If result is POSITIVE SARS-CoV-2 target nucleic acids are DETECTED.  The SARS-CoV-2 RNA is generally detectable in upper and lower  respiratory specimens during the acute phase of infection.  Positive  results are indicative of active infection with SARS-CoV-2.  Clinical  correlation with patient history and other diagnostic information is  necessary to determine patient infection status.  Positive results do  not rule out bacterial infection or co-infection with other viruses. If result is PRESUMPTIVE POSTIVE SARS-CoV-2 nucleic acids MAY BE PRESENT.   A presumptive positive result was obtained on the submitted specimen  and confirmed on repeat testing.  While 2019 novel coronavirus  (SARS-CoV-2) nucleic acids may be present in the submitted sample  additional confirmatory testing may be necessary for epidemiological  and / or clinical management purposes  to differentiate between  SARS-CoV-2 and other Sarbecovirus currently known to infect humans.  If clinically indicated additional testing with an alternate test  methodology [phone])  is advised. The SARS-CoV-2 RNA is generally  detectable in upper and lower respiratory specimens during the acute  phase of infection.  The expected result is Negative. Fact Sheet for Patients:  StrictlyIdeas.no Fact Sheet for Healthcare Providers: BankingDealers.co.za This test is not yet approved or cleared by the [place] FDA and has been authorized for detection and/or diagnosis of SARS-CoV-2 by FDA under an Emergency Use Authorization (EUA).  This EUA will remain in effect (meaning this test can be used) for the duration of the COVID-19 declaration under Section 564(b)(1) of the Act, 21 U.S.C. section 360bbb-3(b)(1), unless the authorization is terminated or revoked sooner. Performed at [facility], [place] [address] [place], [place] [address]   Blood Culture (routine x 2)     Status: None (Preliminary result)   Collection Time: 11/15/18 10:50  PM   Specimen: BLOOD  Result Value Ref Range Status   Specimen Description BLOOD RIGHT HAND  Final   Special Requests   Final    BOTTLES DRAWN AEROBIC AND ANAEROBIC Blood Culture adequate volume   Culture   Final    NO GROWTH 1 DAY Performed at [facility], [place] [address], [place], [place] [address]    Report Status PENDING  Incomplete  Blood Culture (routine x 2)     Status: None (Preliminary result)   Collection Time: 11/15/18 11:05 PM   Specimen: BLOOD  Result Value Ref Range Status   Specimen Description BLOOD LEFT HAND  Final   Special Requests   Final    BOTTLES DRAWN AEROBIC AND ANAEROBIC Blood Culture adequate volume   Culture   Final    NO GROWTH 1 DAY Performed at [facility], [place] [address], [place], [place] [address]    Report Status PENDING  Incomplete         Radiology Studies: Dg Chest Portable 1 View  Result Date: 11/15/2018 CLINICAL DATA:  Shortness of breath and cough. EXAM: PORTABLE CHEST 1 VIEW COMPARISON:  December 17, 2017 FINDINGS: The heart size is enlarged. The lung volumes are low. There are prominent interstitial lung markings bilaterally. There are a few  ground-glass airspace opacities bilaterally. There is no pneumothorax. There is no definite large pleural effusion. No acute osseous abnormality. IMPRESSION: Findings concerning for an atypical infectious process versus pulmonary edema. Electronically Signed   By: Constance Holster M.D.   On: 11/15/2018 21:35        Scheduled Meds: . albuterol  1 puff Inhalation Q6H  . enoxaparin (LOVENOX) injection  60 mg Subcutaneous Q24H  . insulin aspart  0-20 Units Subcutaneous TID WC  . insulin aspart  6 Units Subcutaneous TID WC  . insulin detemir  10 Units Subcutaneous Daily  . methylPREDNISolone (SOLU-MEDROL) injection  40 mg Intravenous Q8H  . metoprolol succinate  200 mg Oral Daily  . pantoprazole  40 mg Oral Daily  . sodium chloride flush  3 mL Intravenous Q12H  . vitamin C  500 mg Oral Daily  . zinc sulfate  220 mg Oral Daily   Continuous Infusions: . remdesivir 100 mg in NS 250 mL 100 mg (11/17/18 0950)     LOS: 2 days    Time spent: 25 minutes      Edwin Dada, MD [facility] 11/17/2018, 11:45 AM     Please page through [facility]:  [email or web address] Password [email or web address] If 7PM-7AM, please contact night-coverage

## 2018-11-18 LAB — COMPREHENSIVE METABOLIC PANEL
ALT: 13 U/L (ref 0–44)
AST: 23 U/L (ref 15–41)
Albumin: 3.9 g/dL (ref 3.5–5.0)
Alkaline Phosphatase: 51 U/L (ref 38–126)
Anion gap: 7 (ref 5–15)
BUN: 20 mg/dL (ref 8–23)
CO2: 28 mmol/L (ref 22–32)
Calcium: 9.5 mg/dL (ref 8.9–10.3)
Chloride: 104 mmol/L (ref 98–111)
Creatinine, Ser: 0.72 mg/dL (ref 0.44–1.00)
GFR calc Af Amer: >60 mL/min (ref >60)
GFR calc non Af Amer: >60 mL/min (ref >60)
Glucose, Bld: 220 mg/dL — ABNORMAL HIGH (ref 70–99)
Potassium: 5.1 mmol/L (ref 3.5–5.1)
Sodium: 139 mmol/L (ref 135–145)
Total Bilirubin: 0.8 mg/dL (ref 0.3–1.2)
Total Protein: 7 g/dL (ref 6.5–8.1)

## 2018-11-18 LAB — C-REACTIVE PROTEIN: CRP: 9.2 mg/dL — ABNORMAL HIGH

## 2018-11-18 LAB — CBC
HCT: 43.2 % (ref 36.0–46.0)
Hemoglobin: 13.6 g/dL (ref 12.0–15.0)
MCH: 31 pg (ref 26.0–34.0)
MCHC: 31.5 g/dL (ref 30.0–36.0)
MCV: 98.4 fL (ref 80.0–100.0)
Platelets: 64 10*3/uL — ABNORMAL LOW (ref 150–400)
RBC: 4.39 MIL/uL (ref 3.87–5.11)
RDW: 21.5 % — ABNORMAL HIGH (ref 11.5–15.5)
WBC: 2.4 10*3/uL — ABNORMAL LOW (ref 4.0–10.5)
nRBC: 1.7 % — ABNORMAL HIGH (ref 0.0–0.2)

## 2018-11-18 LAB — GLUCOSE, CAPILLARY
Glucose-Capillary: 303 mg/dL — ABNORMAL HIGH (ref 70–99)
Glucose-Capillary: 182 mg/dL — ABNORMAL HIGH (ref 70–99)
Glucose-Capillary: 287 mg/dL — ABNORMAL HIGH (ref 70–99)
Glucose-Capillary: 232 mg/dL — ABNORMAL HIGH (ref 70–99)
Glucose-Capillary: 296 mg/dL — ABNORMAL HIGH (ref 70–99)

## 2018-11-18 LAB — INTERLEUKIN-6, PLASMA: Interleukin-6, Plasma: 11.9 pg/mL (ref 0.0–12.2)

## 2018-11-18 MED ORDER — TRAZODONE HCL 50 MG PO TABS
50.0000 mg | ORAL_TABLET | Freq: Every evening | ORAL | Status: DC | PRN
  Administered 2018-11-18 – 2018-11-21 (×3): 50 mg via ORAL
  Filled 2018-11-18 (×3): qty 1

## 2018-11-18 MED ORDER — INSULIN DETEMIR 100 UNIT/ML ~~LOC~~ SOLN
10.0000 [IU] | Freq: Two times a day (BID) | SUBCUTANEOUS | Status: DC
  Administered 2018-11-18 – 2018-11-19 (×2): 10 [IU] via SUBCUTANEOUS
  Filled 2018-11-18 (×2): qty 0.1

## 2018-11-18 NOTE — Progress Notes (Signed)
Night shift hospitalist coverage note.  The patient has requested something to help her sleep.  After checking her weight, age, comorbidities, recent labs and current medication profile, a trial of trazodone 50 mg p.o. as needed was ordered.  Tennis Must, MD.

## 2018-11-18 NOTE — Plan of Care (Signed)

## 2018-11-18 NOTE — Progress Notes (Signed)
PROGRESS NOTE    Kathy Irwin  OEH:[id number] DOB: [birth year] DOA: 11/15/2018 PCP: Patient, No Pcp Per      Brief Narrative:  Mrs. Kathy Irwin is a 68 y.o. F with DM, HTN, thrombocytopenia and recurring interstitial lung disease NOS who presents with 3 weeks cough, now 3-4 days noticeable SOB and wheezing.  In the ER, CXR with opacity, hypoxic and COVID+.         Assessment & Plan:  Coronavirus pneumonitis with acute hypoxic respiratory failure In setting of ongoing 2020 COVID-19 pandemic. Admitted 7/31, last fever 8/1.  Procal negative on admission.  Initially on 2L.  Now up to 10L O2, but appears comfortable. S/p Actemra 8/2  -Continue remdesivir, day 3 of 5 -Continue steroids, day 4 -Continue VTE PPx with Lovenox -Continue Zinc and Vitamin C  -Daily LFTs    Lung disease This is poorly characterized.   Patient only able to provide general idea of her lung disease. CareEverywhere indicates extensive work up at [facility] back in 2012-2013 (CT chest showing interstitial disease, echo that was normal, transbronch biopsy that showed non-granulomatous inflammation, chronic).  She was steroid responsive, and so a VATS biopsy was considered but deferred.  In 2019 last fall she was admitted here, CT lungs suggested hypersensitivity pneumonitis vs pHTN, echo was normal, and she was lost to follow up.  She describes an episodic recurring character to her lung disease, over years.  She is only mildly limited at baseline (SOB with moderate exertion, consistent with morbid obesity deconditioning).  She may be unaware of the extent of her hypoxia, however, as she describes typical SpO2 by home pulse ox in the upper 80s, and at times in the 60s.  She has clubbing.  She has PFTs described in her CareEverywhere notes that indicate a   Diabetes A1c 7.6%.  Glucoses high normal.   -Hold metformin, home 75/25 -Continue Levemir, increase dose -Continue SSI corrections with mealtime insulin  Thrombocytopenia Stable today -Daily CBC  Hypertension BP normal -Continue metoprolol  Morbid obesity Likely OHS       MDM and disposition: The below labs and imaging reports were reviewed and summarized above.  Medication management as above.  The patient was admitted with COVID-19.  She has steadily worsening respiratory failure, now requiring 10 L of supplemental oxygen, although her mentation remains good.  We will continue home dose of her, increased dose of steroids and insulin.  This is a severe disease with acute threat to life or bodily function.       DVT prophylaxis: Lovenox Code Status: FULL Family Communication:  Sister by phone    Antimicrobials:   Ceftriaxone x1 7/31  Azithromycin x1 7/31   Culture data:   7/31 blood culture x2 -- NGTD      Subjective: Cough persists, no fever, no confusion, no vomiting, no diarrhea.  She has global weakness.  Objective: Vitals:   11/18/18 0100 11/18/18 0335 11/18/18 0800 11/18/18 0848  BP: 132/69 125/69 133/69 133/69  Pulse: (!) 59 66 60 68  Resp: (!) 22 (!) 28 (!) 22   Temp: (!) 97.4 F (36.3 C) 97.7 F (36.5 C) 97.7 F (36.5 C)   TempSrc: Oral Oral Oral   SpO2: 92% (!) 89% (!) 86%   Weight:      Height:        Intake/Output Summary (Last 24 hours) at 11/18/2018 1215 Last data filed at 11/18/2018 0855 Gross per 24 hour  Intake 833 ml  Output 1700 ml  Net -867 ml   Filed Weights   11/15/18 2146  Weight: 117.9 kg    Examination:   General: Obese adult female, sitting in recliner, no acute distress, appears very tired, eyes closed but opens them to verbal stimuli. HEENT: Corneas clear, conjunctive, lids and lashes normal, visual tracking smooth, oropharynx moist without oral lesions, hearing normal. Cardiac: Regular rate and rhythm, no murmurs, JVP not visible, no lower extremity edema. Respiratory: Tachypneic, steady, breath sounds diminished but no wheezing rales. Abdomen: No tenderness  palpation or guarding. Extremities: Clubbing noted.  Otherwise extremities are warm and well perfused.  Normal muscle bulk and tone.   Neuro: Speech fluent, moves all extremities equally with normal coordination, strength seems normal. Psych: Affect normal, thought processes linear, attention normal.     Data Reviewed: I have personally reviewed following labs and imaging studies:  CBC: Recent Labs  Lab 11/15/18 2108 11/16/18 0048 11/17/18 0815 11/18/18 0925  WBC 3.9*  --  3.6* 2.4*  HGB 14.4 15.6* 13.4 13.6  HCT 45.1 46.0 43.1 43.2  MCV 96.8  --  97.5 98.4  PLT 65*  --  62* 64*   Basic Metabolic Panel: Recent Labs  Lab 11/15/18 2108 11/16/18 0024 11/16/18 0048 11/16/18 2040 11/18/18 0925  NA 136 135 139 140 139  K 4.5 5.4* 4.1 4.5 5.1  CL 102 104  --  104 104  CO2 22 20*  --  28 28  GLUCOSE 224* 227*  --  217* 220*  BUN 9 12  --  17 20  CREATININE 0.86 0.72  --  0.81 0.72  CALCIUM 9.6 9.3  --  10.1 9.5  MG  --   --   --  1.7  --    GFR: Estimated Creatinine Clearance: 85 mL/min (by C-G formula based on SCr of 0.72 mg/dL). Liver Function Tests: Recent Labs  Lab 11/16/18 0024 11/18/18 0925  AST 56* 23  ALT 21 13  ALKPHOS 58 51  BILITOT 1.1 0.8  PROT 6.2* 7.0  ALBUMIN 3.8 3.9   No results for input(s): LIPASE, AMYLASE in the last 168 hours. No results for input(s): AMMONIA in the last 168 hours. Coagulation Profile: No results for input(s): INR, PROTIME in the last 168 hours. Cardiac Enzymes: No results for input(s): CKTOTAL, CKMB, CKMBINDEX, TROPONINI in the last 168 hours. BNP (last 3 results) No results for input(s): PROBNP in the last 8760 hours. HbA1C: Recent Labs    11/16/18 0945  HGBA1C 7.6*   CBG: Recent Labs  Lab 11/17/18 1656 11/17/18 2022 11/17/18 2132 11/18/18 0815 11/18/18 1157  GLUCAP 188* 249* 262* 182* 287*   Lipid Profile: No results for input(s): CHOL, HDL, LDLCALC, TRIG, CHOLHDL, LDLDIRECT in the last 72 hours. Thyroid  Function Tests: No results for input(s): TSH, T4TOTAL, FREET4, T3FREE, THYROIDAB in the last 72 hours. Anemia Panel: Recent Labs    11/16/18 0024  FERRITIN 276   Urine analysis: No results found for: COLORURINE, APPEARANCEUR, LABSPEC, PHURINE, GLUCOSEU, HGBUR, BILIRUBINUR, KETONESUR, PROTEINUR, UROBILINOGEN, NITRITE, LEUKOCYTESUR Sepsis Labs: _0 (procalcitonin:4,lacticacidven:4)  ) Recent Results (from the past 240 hour(s))  SARS Coronavirus 2 [facility] order, Performed in [facility] hospital lab) Nasopharyngeal Nasopharyngeal Swab     Status: Abnormal   Collection Time: 11/15/18  9:49 PM   Specimen: Nasopharyngeal Swab  Result Value Ref Range Status   SARS Coronavirus 2 POSITIVE (A) NEGATIVE Final    Comment: RESULT CALLED TO, READ BACK BY AND VERIFIED WITH: M. BROOKS,RN 2308 11/15/2018 T. TYSOR (NOTE)  If result is NEGATIVE SARS-CoV-2 target nucleic acids are NOT DETECTED. The SARS-CoV-2 RNA is generally detectable in upper and lower  respiratory specimens during the acute phase of infection. The lowest  concentration of SARS-CoV-2 viral copies this assay can detect is 250  copies / mL. A negative result does not preclude SARS-CoV-2 infection  and should not be used as the sole basis for treatment or other  patient management decisions.  A negative result may occur with  improper specimen collection / handling, submission of specimen other  than nasopharyngeal swab, presence of viral mutation(s) within the  areas targeted by this assay, and inadequate number of viral copies  (<250 copies / mL). A negative result must be combined with clinical  observations, patient history, and epidemiological information. If result is POSITIVE SARS-CoV-2 target nucleic acids are DETECTED.  The SARS-CoV-2 RNA is generally detectable in upper and lower  respiratory specimens during the acute phase of infection.  Positive  results are indicative of active infection with SARS-CoV-2.   Clinical  correlation with patient history and other diagnostic information is  necessary to determine patient infection status.  Positive results do  not rule out bacterial infection or co-infection with other viruses. If result is PRESUMPTIVE POSTIVE SARS-CoV-2 nucleic acids MAY BE PRESENT.   A presumptive positive result was obtained on the submitted specimen  and confirmed on repeat testing.  While 2019 novel coronavirus  (SARS-CoV-2) nucleic acids may be present in the submitted sample  additional confirmatory testing may be necessary for epidemiological  and / or clinical management purposes  to differentiate between  SARS-CoV-2 and other Sarbecovirus currently known to infect humans.  If clinically indicated additional testing with an alternate test  methodology [phone])  is advised. The SARS-CoV-2 RNA is generally  detectable in upper and lower respiratory specimens during the acute  phase of infection. The expected result is Negative. Fact Sheet for Patients:  StrictlyIdeas.no Fact Sheet for Healthcare Providers: BankingDealers.co.za This test is not yet approved or cleared by the [place] FDA and has been authorized for detection and/or diagnosis of SARS-CoV-2 by FDA under an Emergency Use Authorization (EUA).  This EUA will remain in effect (meaning this test can be used) for the duration of the COVID-19 declaration under Section 564(b)(1) of the Act, 21 U.S.C. section 360bbb-3(b)(1), unless the authorization is terminated or revoked sooner. Performed at [facility], [place] [address], [place], [place] [address]   Blood Culture (routine x 2)     Status: None (Preliminary result)   Collection Time: 11/15/18 10:50 PM   Specimen: BLOOD  Result Value Ref Range Status   Specimen Description BLOOD RIGHT HAND  Final   Special Requests   Final    BOTTLES DRAWN AEROBIC AND ANAEROBIC Blood Culture adequate volume   Culture    Final    NO GROWTH 2 DAYS Performed at [facility], [place] [address], [place], [place] [address]    Report Status PENDING  Incomplete  Blood Culture (routine x 2)     Status: None (Preliminary result)   Collection Time: 11/15/18 11:05 PM   Specimen: BLOOD  Result Value Ref Range Status   Specimen Description BLOOD LEFT HAND  Final   Special Requests   Final    BOTTLES DRAWN AEROBIC AND ANAEROBIC Blood Culture adequate volume   Culture   Final    NO GROWTH 2 DAYS Performed at [facility], [place] [address] [place], [place] [address]  Report Status PENDING  Incomplete         Radiology Studies: No results found.      Scheduled Meds: . albuterol  1 puff Inhalation Q6H  . enoxaparin (LOVENOX) injection  60 mg Subcutaneous Q24H  . insulin aspart  0-20 Units Subcutaneous TID WC  . insulin aspart  6 Units Subcutaneous TID WC  . insulin detemir  10 Units Subcutaneous BID  . methylPREDNISolone (SOLU-MEDROL) injection  40 mg Intravenous Q8H  . metoprolol succinate  200 mg Oral Daily  . pantoprazole  40 mg Oral Daily  . sodium chloride flush  3 mL Intravenous Q12H  . vitamin C  500 mg Oral Daily  . zinc sulfate  220 mg Oral Daily   Continuous Infusions: . remdesivir 100 mg in NS 250 mL 100 mg (11/18/18 0855)     LOS: 3 days    Time spent: 35 minutes      Edwin Dada, MD [facility] 11/18/2018, 12:15 PM     Please page through [facility]:  [email or web address] Password [email or web address] If 7PM-7AM, please contact night-coverage

## 2018-11-19 LAB — CBC
HCT: 40.2 % (ref 36.0–46.0)
Hemoglobin: 12.5 g/dL (ref 12.0–15.0)
MCH: 30.6 pg (ref 26.0–34.0)
MCHC: 31.1 g/dL (ref 30.0–36.0)
MCV: 98.5 fL (ref 80.0–100.0)
Platelets: 69 10*3/uL — ABNORMAL LOW (ref 150–400)
RBC: 4.08 MIL/uL (ref 3.87–5.11)
RDW: 20.5 % — ABNORMAL HIGH (ref 11.5–15.5)
WBC: 3.1 10*3/uL — ABNORMAL LOW (ref 4.0–10.5)
nRBC: 1.6 % — ABNORMAL HIGH (ref 0.0–0.2)

## 2018-11-19 LAB — COMPREHENSIVE METABOLIC PANEL
ALT: 13 U/L (ref 0–44)
AST: 19 U/L (ref 15–41)
Albumin: 3.4 g/dL — ABNORMAL LOW (ref 3.5–5.0)
Alkaline Phosphatase: 46 U/L (ref 38–126)
Anion gap: 5 (ref 5–15)
BUN: 23 mg/dL (ref 8–23)
CO2: 30 mmol/L (ref 22–32)
Calcium: 9.5 mg/dL (ref 8.9–10.3)
Chloride: 101 mmol/L (ref 98–111)
Creatinine, Ser: 0.62 mg/dL (ref 0.44–1.00)
GFR calc Af Amer: >60 mL/min (ref >60)
GFR calc non Af Amer: >60 mL/min (ref >60)
Glucose, Bld: 204 mg/dL — ABNORMAL HIGH (ref 70–99)
Potassium: 4.9 mmol/L (ref 3.5–5.1)
Sodium: 136 mmol/L (ref 135–145)
Total Bilirubin: 0.6 mg/dL (ref 0.3–1.2)
Total Protein: 6 g/dL — ABNORMAL LOW (ref 6.5–8.1)

## 2018-11-19 LAB — GLUCOSE, CAPILLARY
Glucose-Capillary: 159 mg/dL — ABNORMAL HIGH (ref 70–99)
Glucose-Capillary: 264 mg/dL — ABNORMAL HIGH (ref 70–99)
Glucose-Capillary: 251 mg/dL — ABNORMAL HIGH (ref 70–99)
Glucose-Capillary: 259 mg/dL — ABNORMAL HIGH (ref 70–99)

## 2018-11-19 LAB — C-REACTIVE PROTEIN: CRP: 3.6 mg/dL — ABNORMAL HIGH (ref <1.0)

## 2018-11-19 MED ORDER — ALPRAZOLAM 0.5 MG PO TABS
0.5000 mg | ORAL_TABLET | Freq: Two times a day (BID) | ORAL | Status: DC | PRN
  Administered 2018-11-19 – 2018-11-20 (×2): 0.5 mg via ORAL
  Filled 2018-11-19 (×3): qty 1

## 2018-11-19 MED ORDER — INSULIN DETEMIR 100 UNIT/ML ~~LOC~~ SOLN
15.0000 [IU] | Freq: Two times a day (BID) | SUBCUTANEOUS | Status: DC
  Administered 2018-11-19 – 2018-11-20 (×3): 15 [IU] via SUBCUTANEOUS
  Filled 2018-11-19 (×4): qty 0.15

## 2018-11-19 MED ORDER — METOPROLOL SUCCINATE ER 100 MG PO TB24
100.0000 mg | ORAL_TABLET | Freq: Every day | ORAL | Status: DC
  Administered 2018-11-20 – 2018-11-22 (×3): 100 mg via ORAL
  Filled 2018-11-19 (×4): qty 1

## 2018-11-19 MED ORDER — INSULIN ASPART 100 UNIT/ML ~~LOC~~ SOLN
9.0000 [IU] | Freq: Three times a day (TID) | SUBCUTANEOUS | Status: DC
  Administered 2018-11-19 – 2018-11-20 (×6): 9 [IU] via SUBCUTANEOUS

## 2018-11-19 NOTE — Plan of Care (Signed)

## 2018-11-19 NOTE — Care Management Important Message (Signed)
Important Message  Patient Details  Name: Kathy Irwin MRN: [id number] Date of Birth: 1951/01/04   Medicare Important Message Given:  Yes - Important Message mailed due to current National Emergency   Verbal consent obtained due to current National Emergency    Contact Name: Ernesto Rutherford Call Date: 11/19/18  Time: 1519 Phone: [phone] Outcome: Spoke with contact Important Message mailed to: [facility], [place] [address])      Orbie Pyo 11/19/2018, 3:21 PM

## 2018-11-19 NOTE — Progress Notes (Signed)
PROGRESS NOTE    Kathy Irwin  SHF:[id number] DOB: 10-Jan-1951 DOA: 11/15/2018 PCP: Patient, No Pcp Per      Brief Narrative:  Kathy Irwin is a 68 y.o. F with DM, HTN, thrombocytopenia and recurring interstitial lung disease NOS who presents with 3 weeks cough, now 3-4 days noticeable SOB and wheezing.  In the ER, CXR with opacity, hypoxic and COVID+.         Assessment & Plan:  Coronavirus pneumonitis with acute hypoxic respiratory failure In setting of ongoing 2020 COVID-19 pandemic. Admitted 7/31, last fever 8/1.  S/p Actemra 8/2   LFTs stable. On 10L yesterday, briefly to 15L overnight last night, back down this morning and stable on 6-10L, even with PT.     -Continue remdesivir, day 4 of 5 -Continue steroids, day 5 -Continue VTE PPx with Lovenox -Continue Zinc and Vitamin C  -Daily LFTs    Lung disease This is poorly characterized.   Patient only able to provide general idea of her lung disease. CareEverywhere indicates extensive work up at [facility] back in 2012-2013 (CT chest showing interstitial disease, echo that was normal, transbronch biopsy that showed non-granulomatous inflammation, chronic). She also tested positive for some aspergillus antigens in 2019, but I see no follow up notes to comment on the significance of these. She was steroid responsive, and so a VATS biopsy was considered but deferred.  In 2019 last fall she was admitted here, CT lungs suggested hypersensitivity pneumonitis vs pHTN, echo was normal, and she was lost to follow up.  She describes an episodic recurring character to her lung disease, over years.  She is only mildly limited at baseline (SOB with moderate exertion, consistent with morbid obesity deconditioning).  She may be unaware of the extent of her hypoxia, however, as she describes typical SpO2 by home pulse ox in the upper 80s, and at times in the 60s.  She has clubbing.  She has PFTs described in her CareEverywhere notes that  indicate a restrictive disease with some superimposed obstructive component at times, at times not.  She was wheezing a little today. -Continue Singulair -Continue Albuterol scheduled and PRN  Diabetes A1c 7.6%.  Glucoses trending up, poorly controlled yesterday, slightly better today, still high. -Hold metformin, home 75/25 -Continue Levemir, increase dose -Continue SSI corrections -Increase mealtime insulin  Thrombocytopenia Stable today, platelets 69K -Daily CBC  Sinus bradycardia HR 40s overnight, asymptomatic.   -Hold metoprolol this AM -Resume tomorrow, lower dose, and with hold parameters  Hypertension BP normal -Continue metoprolol as above  Morbid obesity Likely OHS  Other medications -Continue trazodone -Continue PPI -Continue oxybutynin       MDM and disposition: The below labs and imaging reports were reviewed and summarized above.  Medication management as above. This is a severe disease with acute threat to life or bodily function.      The patient was admitted with severe COVID-19.  She remains on extremely high levels of supplemental O2.  She has received Actemra and steroids and is 2 days from finishing her course of remdesivir.  Likely end dispo to home, less likely SNF (PT eval pending), but suspect this is >4 days from now.         DVT prophylaxis: Lovenox Code Status: FULL Family Communication:  Sister by phone    Antimicrobials:   Ceftriaxone x1 7/31  Azithromycin x1 7/31   Culture data:   7/31 blood culture x2 -- NGTD      Subjective: Still with  frequent dry cough, and dyspnea with cough.  No new fever, confusion, vomiting, diarrhea.     Objective: Vitals:   11/18/18 2135 11/19/18 0414 11/19/18 0815 11/19/18 0821  BP:  122/62 (!) 143/97   Pulse: 73 (!) 59 (!) 58 60  Resp:  20    Temp:  98.1 F (36.7 C) 98.3 F (36.8 C)   TempSrc:  Oral    SpO2: 93% 94% (!) 76% 91%  Weight:      Height:       No intake or  output data in the 24 hours ending 11/19/18 1654 Filed Weights   11/15/18 2146  Weight: 117.9 kg    Examination:  General appearance: Obese adult female, alert sitting in recliner, and in no obvious distress.   HEENT: Anicteric, conjunctiva pink, lids and lashes normal. No nasal deformity, discharge, epistaxis.  Lips moist, teeth normal. OP moist, no oral lesions.  Hearing normal Skin: Warm and dry.  No suspicious rashes or lesions. Cardiac: RRR, no murmurs appreciated.  No LE edema.   JVP normal. Respiratory: Coughing frequently, respiratory rate increased, wheezing bilaterally today, no rales. Abdomen: Abdomen soft.  No tenderness palpation. No ascites, distension, hepatosplenomegaly.   MSK: No deformities or effusions of the large joints of the upper or lower extremities bilaterally. Neuro: Awake and alert. Naming is grossly intact, and the patient's recall, recent and remote, as well as general fund of knowledge seem within normal limits.  Muscle tone normal, without fasciculations.  Moves all extremities equally and with normal coordination.  Marland Kitchen Speech fluent.    Psych: Sensorium intact and responding to questions, attention normal. Affect normal.  Judgment and insight appear normal.       Data Reviewed: I have personally reviewed following labs and imaging studies:  CBC: Recent Labs  Lab 11/15/18 2108 11/16/18 0048 11/17/18 0815 11/18/18 0925 11/19/18 0650  WBC 3.9*  --  3.6* 2.4* 3.1*  HGB 14.4 15.6* 13.4 13.6 12.5  HCT 45.1 46.0 43.1 43.2 40.2  MCV 96.8  --  97.5 98.4 98.5  PLT 65*  --  62* 64* 69*   Basic Metabolic Panel: Recent Labs  Lab 11/15/18 2108 11/16/18 0024 11/16/18 0048 11/16/18 2040 11/18/18 0925 11/19/18 0650  NA 136 135 139 140 139 136  K 4.5 5.4* 4.1 4.5 5.1 4.9  CL 102 104  --  104 104 101  CO2 22 20*  --  _0 GLUCOSE 224* 227*  --  217* 220* 204*  BUN 9 12  --  _1 CREATININE 0.86 0.72  --  0.81 0.72 0.62  CALCIUM 9.6 9.3  --   10.1 9.5 9.5  MG  --   --   --  1.7  --   --    GFR: Estimated Creatinine Clearance: 85 mL/min (by C-G formula based on SCr of 0.62 mg/dL). Liver Function Tests: Recent Labs  Lab 11/16/18 0024 11/18/18 0925 11/19/18 0650  AST 56* 23 19  ALT _2 ALKPHOS 58 51 46  BILITOT 1.1 0.8 0.6  PROT 6.2* 7.0 6.0*  ALBUMIN 3.8 3.9 3.4*   No results for input(s): LIPASE, AMYLASE in the last 168 hours. No results for input(s): AMMONIA in the last 168 hours. Coagulation Profile: No results for input(s): INR, PROTIME in the last 168 hours. Cardiac Enzymes: No results for input(s): CKTOTAL, CKMB, CKMBINDEX, TROPONINI in the last 168 hours. BNP (last 3 results) No results for input(s): PROBNP in the  last 8760 hours. HbA1C: No results for input(s): HGBA1C in the last 72 hours. CBG: Recent Labs  Lab 11/18/18 1604 11/18/18 2112 11/19/18 0815 11/19/18 1155 11/19/18 1628  GLUCAP 232* 296* 159* 264* 251*   Lipid Profile: No results for input(s): CHOL, HDL, LDLCALC, TRIG, CHOLHDL, LDLDIRECT in the last 72 hours. Thyroid Function Tests: No results for input(s): TSH, T4TOTAL, FREET4, T3FREE, THYROIDAB in the last 72 hours. Anemia Panel: No results for input(s): VITAMINB12, FOLATE, FERRITIN, TIBC, IRON, RETICCTPCT in the last 72 hours. Urine analysis: No results found for: COLORURINE, APPEARANCEUR, LABSPEC, PHURINE, GLUCOSEU, HGBUR, BILIRUBINUR, KETONESUR, PROTEINUR, UROBILINOGEN, NITRITE, LEUKOCYTESUR Sepsis Labs: _0 (procalcitonin:4,lacticacidven:4)  ) Recent Results (from the past 240 hour(s))  SARS Coronavirus 2 [facility] order, Performed in [facility] hospital lab) Nasopharyngeal Nasopharyngeal Swab     Status: Abnormal   Collection Time: 11/15/18  9:49 PM   Specimen: Nasopharyngeal Swab  Result Value Ref Range Status   SARS Coronavirus 2 POSITIVE (A) NEGATIVE Final    Comment: RESULT CALLED TO, READ BACK BY AND VERIFIED WITH: M. BROOKS,RN 2308 11/15/2018 T. TYSOR  (NOTE) If result is NEGATIVE SARS-CoV-2 target nucleic acids are NOT DETECTED. The SARS-CoV-2 RNA is generally detectable in upper and lower  respiratory specimens during the acute phase of infection. The lowest  concentration of SARS-CoV-2 viral copies this assay can detect is 250  copies / mL. A negative result does not preclude SARS-CoV-2 infection  and should not be used as the sole basis for treatment or other  patient management decisions.  A negative result may occur with  improper specimen collection / handling, submission of specimen other  than nasopharyngeal swab, presence of viral mutation(s) within the  areas targeted by this assay, and inadequate number of viral copies  (<250 copies / mL). A negative result must be combined with clinical  observations, patient history, and epidemiological information. If result is POSITIVE SARS-CoV-2 target nucleic acids are DETECTED.  The SARS-CoV-2 RNA is generally detectable in upper and lower  respiratory specimens during the acute phase of infection.  Positive  results are indicative of active infection with SARS-CoV-2.  Clinical  correlation with patient history and other diagnostic information is  necessary to determine patient infection status.  Positive results do  not rule out bacterial infection or co-infection with other viruses. If result is PRESUMPTIVE POSTIVE SARS-CoV-2 nucleic acids MAY BE PRESENT.   A presumptive positive result was obtained on the submitted specimen  and confirmed on repeat testing.  While 2019 novel coronavirus  (SARS-CoV-2) nucleic acids may be present in the submitted sample  additional confirmatory testing may be necessary for epidemiological  and / or clinical management purposes  to differentiate between  SARS-CoV-2 and other Sarbecovirus currently known to infect humans.  If clinically indicated additional testing with an alternate test  methodology [phone])  is advised. The SARS-CoV-2 RNA is  generally  detectable in upper and lower respiratory specimens during the acute  phase of infection. The expected result is Negative. Fact Sheet for Patients:  StrictlyIdeas.no Fact Sheet for Healthcare Providers: BankingDealers.co.za This test is not yet approved or cleared by the [place] FDA and has been authorized for detection and/or diagnosis of SARS-CoV-2 by FDA under an Emergency Use Authorization (EUA).  This EUA will remain in effect (meaning this test can be used) for the duration of the COVID-19 declaration under Section 564(b)(1) of the Act, 21 U.S.C. section 360bbb-3(b)(1), unless the authorization is terminated or revoked sooner. Performed at [facility]  Lab, [address] [place], [place] [address]   Blood Culture (routine x 2)     Status: None (Preliminary result)   Collection Time: 11/15/18 10:50 PM   Specimen: BLOOD  Result Value Ref Range Status   Specimen Description BLOOD RIGHT HAND  Final   Special Requests   Final    BOTTLES DRAWN AEROBIC AND ANAEROBIC Blood Culture adequate volume   Culture   Final    NO GROWTH 3 DAYS Performed at [facility], [place] [address] [place], [place] [address]    Report Status PENDING  Incomplete  Blood Culture (routine x 2)     Status: None (Preliminary result)   Collection Time: 11/15/18 11:05 PM   Specimen: BLOOD  Result Value Ref Range Status   Specimen Description BLOOD LEFT HAND  Final   Special Requests   Final    BOTTLES DRAWN AEROBIC AND ANAEROBIC Blood Culture adequate volume   Culture   Final    NO GROWTH 3 DAYS Performed at [facility], [place] [address], [place], [place] [address]    Report Status PENDING  Incomplete         Radiology Studies: No results found.      Scheduled Meds: . albuterol  1 puff Inhalation Q6H  . enoxaparin (LOVENOX) injection  60 mg Subcutaneous Q24H  . insulin aspart  0-20 Units Subcutaneous TID WC  .  insulin aspart  9 Units Subcutaneous TID WC  . insulin detemir  15 Units Subcutaneous BID  . methylPREDNISolone (SOLU-MEDROL) injection  40 mg Intravenous Q8H  . [START ON 11/20/2018] metoprolol succinate  100 mg Oral Daily  . pantoprazole  40 mg Oral Daily  . sodium chloride flush  3 mL Intravenous Q12H  . vitamin C  500 mg Oral Daily  . zinc sulfate  220 mg Oral Daily   Continuous Infusions: . remdesivir 100 mg in NS 250 mL 100 mg (11/19/18 1102)     LOS: 4 days    Time spent: 25 minutes      Edwin Dada, MD [facility] 11/19/2018, 4:54 PM     Please page through [place]:  [email or web address] Password [email or web address] If 7PM-7AM, please contact night-coverage

## 2018-11-19 NOTE — Evaluation (Addendum)
Occupational Therapy Evaluation Patient Details Name: Kathy Irwin MRN: [id number] DOB: [birth year] Today's Date: 11/19/2018    History of Present Illness Mrs. Kathy Irwin is a 68 y.o. F with DM, HTN, thrombocytopenia and recurring interstitial lung disease NOS who presents with 3 weeks cough, now 3-4 days noticeable SOB and wheezing.hypoxic and + Covid-19   Clinical Impression   PTA, pt was living alone and was independent with ADLs and IADLs. Pt currently requiring Min-Mod A for LB ADLs and Min Guard A for functional mobility. Pt performing functional mobility in hallway to simulate home distance. Pt denies fatigue or SOB. SpO2 dropping to 86% on 6L O2 via HFNC; returned to 90% once seated and resting in recliner. Spo2 dropping to 77% with coughing and pt requesting cough medicine; notified RN. Pt would benefit from further acute OT to facilitate safe dc. Recommend dc to home once medically stable per physician.      Follow Up Recommendations  No OT follow up;Supervision/Assistance - 24 hour    Equipment Recommendations  None recommended by OT    Recommendations for Other Services PT consult     Precautions / Restrictions Precautions Precautions: Other (comment) Precaution Comments: watch sats, try to wean Restrictions Weight Bearing Restrictions: No      Mobility Bed Mobility               General bed mobility comments: Seated in recliner  Transfers Overall transfer level: Needs assistance Equipment used: None Transfers: Sit to/from Stand Sit to Stand: Min guard         General transfer comment: Min Guard A for safety    Balance Overall balance assessment: Needs assistance   Sitting balance-Leahy Scale: Normal       Standing balance-Leahy Scale: Fair Standing balance comment: able to place left foot onto toilet to wash up, held RW for support                           ADL either performed or assessed with clinical judgement   ADL Overall ADL's :  Needs assistance/impaired Eating/Feeding: Independent;Sitting   Grooming: Set up;Supervision/safety;Sitting   Upper Body Bathing: Set up;Supervision/ safety;Sitting   Lower Body Bathing: Minimal assistance;Sit to/from stand   Upper Body Dressing : Set up;Supervision/safety;Sitting   Lower Body Dressing: Moderate assistance;Sit to/from stand   Toilet Transfer: Min guard;Ambulation(Simulated to recliner)           Functional mobility during ADLs: Min guard General ADL Comments: Pt denies any SOB. SpO2 dropping to 86% on 6L O2 via HFNC. When coughing SpO2 dropping to 77% (poor waveform)     Vision Baseline Vision/History: Wears glasses Patient Visual Report: No change from baseline       Perception     Praxis      Pertinent Vitals/Pain Pain Assessment: Faces Faces Pain Scale: Hurts little more Pain Location: right lower leg cramp, pt states 'Wants to be sure not a blood clot."RN notified Pain Descriptors / Indicators: Aching;Cramping Pain Intervention(s): Monitored during session;Limited activity within patient's tolerance;Repositioned     Hand Dominance     Extremity/Trunk Assessment Upper Extremity Assessment Upper Extremity Assessment: Generalized weakness   Lower Extremity Assessment Lower Extremity Assessment: Defer to PT evaluation   Cervical / Trunk Assessment Cervical / Trunk Assessment: Other exceptions Cervical / Trunk Exceptions: Increased body habitus   Communication Communication Communication: No difficulties   Cognition Arousal/Alertness: Awake/alert Behavior During Therapy: WFL for tasks assessed/performed Overall Cognitive Status: Within Functional  Limits for tasks assessed                                     General Comments  SpO2 dropping to 86% on 6L O2 via HFNC duirng activity. With coughing, SpO2 dropping to 77% on 6L (poor waveform)    Exercises     Shoulder Instructions      Home Living Family/patient expects to  be discharged to:: Private residence Living Arrangements: Alone Available Help at Discharge: Available PRN/intermittently;Family Type of Home: House Home Access: Stairs to enter   Entrance Stairs-Rails: Right;Left Home Layout: One level     Bathroom Shower/Tub: Occupational psychologist: Handicapped height     Home Equipment: Shower seat   Additional Comments: Pt reporting she lives alone but has family nearby (four kids, four grandchildren, and several siblings)      Prior Functioning/Environment Level of Independence: Independent                 OT Problem List: Decreased strength;Decreased range of motion;Decreased activity tolerance;Impaired balance (sitting and/or standing);Decreased knowledge of use of DME or AE;Decreased knowledge of precautions;Cardiopulmonary status limiting activity      OT Treatment/Interventions: Self-care/ADL training;Therapeutic exercise;Energy conservation;DME and/or AE instruction;Therapeutic activities;Patient/family education    OT Goals(Current goals can be found in the care plan section) Acute Rehab OT Goals Patient Stated Goal: to go home OT Goal Formulation: With patient Time For Goal Achievement: 12/03/18 Potential to Achieve Goals: Good  OT Frequency: Min 3X/week   Barriers to D/C:            Co-evaluation              AM-PAC OT "6 Clicks" Daily Activity     Outcome Measure Help from another person eating meals?: None Help from another person taking care of personal grooming?: A Little Help from another person toileting, which includes using toliet, bedpan, or urinal?: A Little Help from another person bathing (including washing, rinsing, drying)?: A Little Help from another person to put on and taking off regular upper body clothing?: A Little Help from another person to put on and taking off regular lower body clothing?: A Lot 6 Click Score: 18   End of Session Equipment Utilized During Treatment:  Oxygen(6L HFNC) Nurse Communication: Mobility status  Activity Tolerance: Patient tolerated treatment well Patient left: in chair;with call bell/phone within reach;with nursing/sitter in room  OT Visit Diagnosis: Unsteadiness on feet (R26.81);Other abnormalities of gait and mobility (R26.89);Muscle weakness (generalized) (M62.81)                Time: 1401-1430 OT Time Calculation (min): 29 min Charges:  OT General Charges $OT Visit: 1 Visit OT Evaluation $OT Eval Low Complexity: 1 Low OT Treatments $Self Care/Home Management : 8-22 mins  Amy Belloso MSOT, OTR/L Acute Rehab Pager: [phone] Office: [place] 11/19/2018, 3:04 PM

## 2018-11-19 NOTE — Evaluation (Signed)
Physical Therapy Evaluation Patient Details Name: Kathy Irwin MRN: [id number] DOB: 12/25/1950 Today's Date: 11/19/2018   History of Present Illness  Kathy Irwin is a 68 y.o. F with DM, HTN, thrombocytopenia and recurring interstitial lung disease NOS who presents with 3 weeks cough, now 3-4 days noticeable SOB and wheezing.hypoxic and + Covid-19  Clinical Impression  The patient is on 6 L via HFNC with saO2 95% at rest., left finger probe. Patient ambulated to BR and performed bath, standing multiple times. Poor Pleth readings(60's) Pt. Not SOB. Ambulated back to recliner. Placed probe on ear with 95% sats. Pt admitted with above diagnosis. Pt currently with functional limitations due to the deficits listed below (see PT Problem List). Pt will benefit from skilled PT to increase their independence and safety with mobility to allow discharge to the venue listed below.    HR 65    Follow Up Recommendations No PT follow up    Equipment Recommendations  Rolling walker with 5" wheels    Recommendations for Other Services OT consult     Precautions / Restrictions Precautions Precaution Comments: watch sats, try to wean Restrictions Weight Bearing Restrictions: No      Mobility  Bed Mobility               General bed mobility comments: OOB  Transfers Overall transfer level: Needs assistance Equipment used: Rolling walker (2 wheeled);None Transfers: Sit to/from Stand Sit to Stand: Supervision         General transfer comment: rises from recliner and BSC with supervision, pushes up with hands  Ambulation/Gait Ambulation/Gait assistance: Supervision Gait Distance (Feet): 40 Feet(x 2) Assistive device: Rolling walker (2 wheeled) Gait Pattern/deviations: Step-through pattern;Wide base of support Gait velocity: decr   General Gait Details: lateral shifts to advance due to habitus  Stairs            Wheelchair Mobility    Modified Rankin (Stroke Patients Only)        Balance Overall balance assessment: Needs assistance   Sitting balance-Leahy Scale: Normal       Standing balance-Leahy Scale: Fair Standing balance comment: able to place left foot onto toilet to wash up, held RW for support                             Pertinent Vitals/Pain Pain Assessment: Faces Faces Pain Scale: Hurts little more Pain Location: right lower leg cramp, pt states 'Wants to be sure not a blood clot."RN notified Pain Descriptors / Indicators: Aching;Cramping Pain Intervention(s): Monitored during session    Home Living Family/patient expects to be discharged to:: Private residence Living Arrangements: Children Available Help at Discharge: Available PRN/intermittently;Family Type of Home: House Home Access: Stairs to enter Entrance Stairs-Rails: Right;Left   Home Layout: One level Home Equipment: None;Shower seat      Prior Function Level of Independence: Independent               Hand Dominance        Extremity/Trunk Assessment   Upper Extremity Assessment Upper Extremity Assessment: Defer to OT evaluation    Lower Extremity Assessment Lower Extremity Assessment: Generalized weakness    Cervical / Trunk Assessment Cervical / Trunk Assessment: Normal  Communication   Communication: No difficulties  Cognition Arousal/Alertness: Awake/alert Behavior During Therapy: WFL for tasks assessed/performed Overall Cognitive Status: Within Functional Limits for tasks assessed  General Comments      Exercises     Assessment/Plan    PT Assessment Patient needs continued PT services  PT Problem List Decreased activity tolerance;Decreased knowledge of precautions;Decreased mobility;Cardiopulmonary status limiting activity;Obesity       PT Treatment Interventions DME instruction;Therapeutic activities;Gait training;Stair training;Functional mobility  training;Patient/family education    PT Goals (Current goals can be found in the Care Plan section)  Acute Rehab PT Goals Patient Stated Goal: to go home PT Goal Formulation: With patient Time For Goal Achievement: 12/03/18 Potential to Achieve Goals: Good    Frequency Min 3X/week   Barriers to discharge Decreased caregiver support      Co-evaluation               AM-PAC PT "6 Clicks" Mobility  Outcome Measure Help needed turning from your back to your side while in a flat bed without using bedrails?: A Little Help needed moving from lying on your back to sitting on the side of a flat bed without using bedrails?: A Little Help needed moving to and from a bed to a chair (including a wheelchair)?: A Little Help needed standing up from a chair using your arms (e.g., wheelchair or bedside chair)?: A Little Help needed to walk in hospital room?: A Little Help needed climbing 3-5 steps with a railing? : A Lot 6 Click Score: 17    End of Session Equipment Utilized During Treatment: Oxygen Activity Tolerance: Patient tolerated treatment well Patient left: in chair;with call bell/phone within reach Nurse Communication: Mobility status PT Visit Diagnosis: Difficulty in walking, not elsewhere classified (R26.2)    Time: 6546-5035 PT Time Calculation (min) (ACUTE ONLY): 68 min   Charges:   PT Evaluation $PT Eval Moderate Complexity: 1 Mod PT Treatments $Gait Training: 23-37 mins $Self Care/Home Management: [place] Pager [phone] Office [phone]   Kathy Irwin 11/19/2018, 11:14 AM

## 2018-11-19 NOTE — Progress Notes (Signed)
Pt noted to have HR unsustained in the 40s on telemetry. Pt asymptomatic. Will continue to monitor patient status closely.

## 2018-11-20 LAB — COMPREHENSIVE METABOLIC PANEL WITH GFR
ALT: 17 U/L (ref 0–44)
AST: 17 U/L (ref 15–41)
Albumin: 3.5 g/dL (ref 3.5–5.0)
Alkaline Phosphatase: 48 U/L (ref 38–126)
Anion gap: 5 (ref 5–15)
BUN: 21 mg/dL (ref 8–23)
CO2: 30 mmol/L (ref 22–32)
Calcium: 9.6 mg/dL (ref 8.9–10.3)
Chloride: 102 mmol/L (ref 98–111)
Creatinine, Ser: 0.65 mg/dL (ref 0.44–1.00)
GFR calc Af Amer: >60 mL/min
GFR calc non Af Amer: >60 mL/min
Glucose, Bld: 327 mg/dL — ABNORMAL HIGH (ref 70–99)
Potassium: 4.6 mmol/L (ref 3.5–5.1)
Sodium: 137 mmol/L (ref 135–145)
Total Bilirubin: 0.6 mg/dL (ref 0.3–1.2)
Total Protein: 5.9 g/dL — ABNORMAL LOW (ref 6.5–8.1)

## 2018-11-20 LAB — CBC
HCT: 40.8 % (ref 36.0–46.0)
Hemoglobin: 12.5 g/dL (ref 12.0–15.0)
MCH: 30 pg (ref 26.0–34.0)
MCHC: 30.6 g/dL (ref 30.0–36.0)
MCV: 98.1 fL (ref 80.0–100.0)
Platelets: 77 K/uL — ABNORMAL LOW (ref 150–400)
RBC: 4.16 MIL/uL (ref 3.87–5.11)
RDW: 20.5 % — ABNORMAL HIGH (ref 11.5–15.5)
WBC: 2.9 K/uL — ABNORMAL LOW (ref 4.0–10.5)
nRBC: 1 % — ABNORMAL HIGH (ref 0.0–0.2)

## 2018-11-20 LAB — GLUCOSE, CAPILLARY
Glucose-Capillary: 197 mg/dL — ABNORMAL HIGH (ref 70–99)
Glucose-Capillary: 251 mg/dL — ABNORMAL HIGH (ref 70–99)
Glucose-Capillary: 208 mg/dL — ABNORMAL HIGH (ref 70–99)
Glucose-Capillary: 230 mg/dL — ABNORMAL HIGH (ref 70–99)

## 2018-11-20 MED ORDER — HYDRALAZINE HCL 20 MG/ML IJ SOLN: 10.0000 mg | Freq: Four times a day (QID) | INTRAMUSCULAR | Status: DC | PRN

## 2018-11-20 MED ORDER — AMLODIPINE BESYLATE 5 MG PO TABS
10.0000 mg | ORAL_TABLET | Freq: Every day | ORAL | Status: DC
  Administered 2018-11-20 – 2018-11-22 (×3): 10 mg via ORAL
  Filled 2018-11-20 (×3): qty 2

## 2018-11-20 MED ORDER — MAGNESIUM SULFATE IN D5W 1-5 GM/100ML-% IV SOLN
1.0000 g | Freq: Once | INTRAVENOUS | Status: AC
  Administered 2018-11-20: 1 g via INTRAVENOUS
  Filled 2018-11-20: qty 100

## 2018-11-20 MED ORDER — METHYLPREDNISOLONE SODIUM SUCC 40 MG IJ SOLR
40.0000 mg | Freq: Two times a day (BID) | INTRAMUSCULAR | Status: DC
  Administered 2018-11-20 – 2018-11-21 (×2): 40 mg via INTRAVENOUS
  Filled 2018-11-20 (×2): qty 1

## 2018-11-20 MED ORDER — GABAPENTIN 300 MG PO CAPS
300.0000 mg | ORAL_CAPSULE | Freq: Two times a day (BID) | ORAL | Status: DC
  Administered 2018-11-20 – 2018-11-22 (×5): 300 mg via ORAL
  Filled 2018-11-20 (×5): qty 1

## 2018-11-20 MED ORDER — PRAVASTATIN SODIUM 10 MG PO TABS
10.0000 mg | ORAL_TABLET | Freq: Every day | ORAL | Status: DC
  Administered 2018-11-20 – 2018-11-22 (×3): 10 mg via ORAL
  Filled 2018-11-20 (×3): qty 1

## 2018-11-20 NOTE — Progress Notes (Signed)
PROGRESS NOTE                                                                                                                                                                                                             Kathy Irwin Demographics:    Kathy Irwin, is a 68 y.o. female, DOB - [birth year], SLP:[id number]  Outpatient Primary MD for the Kathy Irwin is Kathy Irwin, No Pcp Per    LOS - 5  Admit date - 11/15/2018    Chief Complaint  Kathy Irwin presents with  . Shortness of Breath  . Cough       Brief Narrative  Kathy Irwin is a 68 y.o. F with DM, HTN, thrombocytopenia and recurring interstitial lung disease NOS who presents with 3 weeks cough, now 3-4 days noticeable SOB and wheezing. In the ER, CXR with opacity, hypoxic and COVID+.     Subjective:    Kathy Irwin today has, No headache, No chest pain, No abdominal pain - No Nausea, No new weakness tingling or numbness, much improved cough - SOB.     Assessment  & Plan :      1. Acute Hypoxic Resp. Failure due to Acute Covid 19 Viral Pneumonitis during the ongoing 2020 Covid 19 Pandemic -  She has been treated with IV steroids, Remdisvir along with Actemra 2 doses appropriately at the time of admission.  Her hypoxia and overall clinical situation has improved she is down to 3 L nasal cannula oxygen and will continue to titrate it down, she uses 2 L oxygen at night at baseline.  Inflammatory markers are stabilizing.  Advance activity, encouraged to sit up in chair use flutter valve and I-S for pulmonary toiletry in the daytime and prone at night.  Prepare for discharge in the next 1 to 2 days.  Likely will go home on 2 L nasal cannula oxygen.  Start tapering steroids.  COVID-19 Labs  Recent Labs    11/18/18 0925 11/19/18 0650  CRP 9.2* 3.6*    Lab Results  Component Value Date   SARSCOV2NAA POSITIVE (A) 11/15/2018     Hepatic Function Latest Ref Rng & Units 11/20/2018  11/19/2018 11/18/2018  Total Protein 6.5 - 8.1 g/dL 5.9(L) 6.0(L) 7.0  Albumin 3.5 - 5.0 g/dL 3.5 3.4(L) 3.9  AST 15 - 41 U/L _0 ALT 0 -  44 U/L _0 Alk Phosphatase 38 - 126 U/L 48 46 51  Total Bilirubin 0.3 - 1.2 mg/dL 0.6 0.6 0.8        Component Value Date/Time   BNP 74.4 11/15/2018 2108      2.  Morbid obesity with OSA.  Uses 2 L oxygen at night, continue, outpatient PCP follow-up for weight loss.  3.  Mild COVID-19 infection related thrombocytopenia.  Stable and gradually rising.  4.  GERD.  On PPI.  5.  Essential hypertension -I on beta-blocker will add Norvasc and PRN IV hydralazine for better control.  6.  DM type II.  A1c was 7.6.  Metformin on hold.  Currently on Levemir and SSI along with pre-meal NovoLog.  Will provide insulin education prior to discharge.  May require short-term steroid use upon discharge as well.   CBG (last 3)  Recent Labs    11/19/18 1628 11/19/18 2116 11/20/18 0750  GLUCAP 251* 259* 197*      Condition - Fair  Family Communication  : Previous MD updated sister by phone.  Code Status :  Full  Diet :  Low Carb  Disposition Plan  :  Home in 1-2 days  Consults  :  None  Procedures  :     PUD Prophylaxis : PPI  DVT Prophylaxis  :  Lovenox   Lab Results  Component Value Date   PLT 77 (L) 11/20/2018    Inpatient Medications  Scheduled Meds: . albuterol  1 puff Inhalation Q6H  . enoxaparin (LOVENOX) injection  60 mg Subcutaneous Q24H  . insulin aspart  0-20 Units Subcutaneous TID WC  . insulin aspart  9 Units Subcutaneous TID WC  . insulin detemir  15 Units Subcutaneous BID  . methylPREDNISolone (SOLU-MEDROL) injection  40 mg Intravenous Q8H  . metoprolol succinate  100 mg Oral Daily  . pantoprazole  40 mg Oral Daily  . sodium chloride flush  3 mL Intravenous Q12H  . vitamin C  500 mg Oral Daily  . zinc sulfate  220 mg Oral Daily   Continuous Infusions: . remdesivir 100 mg in NS 250 mL 100 mg (11/20/18 0951)    PRN Meds:.acetaminophen, albuterol, ALPRAZolam, guaiFENesin-dextromethorphan, HYDROcodone-acetaminophen, loratadine, montelukast, ondansetron **OR** ondansetron (ZOFRAN) IV, oxybutynin, phenol, sodium chloride, traZODone  Antibiotics  :    Anti-infectives (From admission, onward)   Start     Dose/Rate Route Frequency Ordered Stop   11/17/18 1000  remdesivir 100 mg in sodium chloride 0.9 % 250 mL IVPB     100 mg 500 mL/hr over 30 Minutes Intravenous Every 24 hours 11/16/18 0855 11/21/18 0959   11/16/18 1000  remdesivir 200 mg in sodium chloride 0.9 % 250 mL IVPB     200 mg 500 mL/hr over 30 Minutes Intravenous Once 11/16/18 0855 11/16/18 1115   11/15/18 2245  cefTRIAXone (ROCEPHIN) 2 g in sodium chloride 0.9 % 100 mL IVPB  Status:  Discontinued     2 g 200 mL/hr over 30 Minutes Intravenous Every 24 hours 11/15/18 2239 11/16/18 0910   11/15/18 2245  azithromycin (ZITHROMAX) 500 mg in sodium chloride 0.9 % 250 mL IVPB  Status:  Discontinued     500 mg 250 mL/hr over 60 Minutes Intravenous Every 24 hours 11/15/18 2239 11/16/18 0910       Time Spent in minutes  30   Lala Lund M.D on 11/20/2018 at 9:52 AM  To page go to [email or web address] - password [email or web address]  [facility] -  Office  [phone]   See all Orders from today for further details    Objective:   Vitals:   11/19/18 2000 11/20/18 0423 11/20/18 0823 11/20/18 0928  BP: 138/78   (!) 145/109  Pulse: 65   84  Resp: 19 18 (!) 22   Temp: 97.6 F (36.4 C) 98.2 F (36.8 C) 98.1 F (36.7 C)   TempSrc: Oral Oral Oral   SpO2: 100% 100%    Weight:      Height:        Wt Readings from Last 3 Encounters:  11/15/18 117.9 kg  11/13/18 117.9 kg  12/17/17 119.3 kg    No intake or output data in the 24 hours ending 11/20/18 8466   Physical Exam  Awake Alert, Oriented X 3, No new F.N deficits, Normal affect [place].AT,PERRAL Supple Neck,No JVD, No cervical lymphadenopathy appriciated.  Symmetrical Chest wall movement,  Good air movement bilaterally, CTAB RRR,No Gallops,Rubs or new Murmurs, No Parasternal Heave +ve B.Sounds, Abd Soft, No tenderness, No organomegaly appriciated, No rebound - guarding or rigidity. No Cyanosis, Clubbing or edema, No new Rash or bruise      Data Review:    CBC Recent Labs  Lab 11/15/18 2108 11/16/18 0048 11/17/18 0815 11/18/18 0925 11/19/18 0650 11/20/18 0310  WBC 3.9*  --  3.6* 2.4* 3.1* 2.9*  HGB 14.4 15.6* 13.4 13.6 12.5 12.5  HCT 45.1 46.0 43.1 43.2 40.2 40.8  PLT 65*  --  62* 64* 69* 77*  MCV 96.8  --  97.5 98.4 98.5 98.1  MCH 30.9  --  30.3 31.0 30.6 30.0  MCHC 31.9  --  31.1 31.5 31.1 30.6  RDW 21.2*  --  21.5* 21.5* 20.5* 20.5*    Chemistries  Recent Labs  Lab 11/16/18 0024 11/16/18 0048 11/16/18 2040 11/18/18 0925 11/19/18 0650 11/20/18 0310  NA 135 139 140 139 136 137  K 5.4* 4.1 4.5 5.1 4.9 4.6  CL 104  --  104 104 101 102  CO2 20*  --  _0 GLUCOSE 227*  --  217* 220* 204* 327*  BUN 12  --  _1 CREATININE 0.72  --  0.81 0.72 0.62 0.65  CALCIUM 9.3  --  10.1 9.5 9.5 9.6  MG  --   --  1.7  --   --   --   AST 56*  --   --  _2 ALT 21  --   --  _3 ALKPHOS 58  --   --  51 46 48  BILITOT 1.1  --   --  0.8 0.6 0.6   ------------------------------------------------------------------------------------------------------------------ No results for input(s): CHOL, HDL, LDLCALC, TRIG, CHOLHDL, LDLDIRECT in the last 72 hours.  Lab Results  Component Value Date   HGBA1C 7.6 (H) 11/16/2018   ------------------------------------------------------------------------------------------------------------------ No results for input(s): TSH, T4TOTAL, T3FREE, THYROIDAB in the last 72 hours.  Invalid input(s): FREET3  Cardiac Enzymes No results for input(s): CKMB, TROPONINI, MYOGLOBIN in the last 168 hours.  Invalid input(s): CK  ------------------------------------------------------------------------------------------------------------------    Component Value Date/Time   BNP 74.4 11/15/2018 2108    Micro Results Recent Results (from the past 240 hour(s))  SARS Coronavirus 2 [facility] order, Performed in [facility] hospital lab) Nasopharyngeal Nasopharyngeal Swab     Status: Abnormal   Collection Time: 11/15/18  9:49 PM   Specimen: Nasopharyngeal Swab  Result Value Ref Range Status   SARS Coronavirus 2  POSITIVE (A) NEGATIVE Final    Comment: RESULT CALLED TO, READ BACK BY AND VERIFIED WITH: M. BROOKS,RN 2308 11/15/2018 T. TYSOR (NOTE) If result is NEGATIVE SARS-CoV-2 target nucleic acids are NOT DETECTED. The SARS-CoV-2 RNA is generally detectable in upper and lower  respiratory specimens during the acute phase of infection. The lowest  concentration of SARS-CoV-2 viral copies this assay can detect is 250  copies / mL. A negative result does not preclude SARS-CoV-2 infection  and should not be used as the sole basis for treatment or other  Kathy Irwin management decisions.  A negative result may occur with  improper specimen collection / handling, submission of specimen other  than nasopharyngeal swab, presence of viral mutation(s) within the  areas targeted by this assay, and inadequate number of viral copies  (<250 copies / mL). A negative result must be combined with clinical  observations, Kathy Irwin history, and epidemiological information. If result is POSITIVE SARS-CoV-2 target nucleic acids are DETECTED.  The SARS-CoV-2 RNA is generally detectable in upper and lower  respiratory specimens during the acute phase of infection.  Positive  results are indicative of active infection with SARS-CoV-2.  Clinical  correlation with Kathy Irwin history and other diagnostic information is  necessary to determine Kathy Irwin infection status.  Positive results do  not rule out bacterial infection or co-infection with  other viruses. If result is PRESUMPTIVE POSTIVE SARS-CoV-2 nucleic acids MAY BE PRESENT.   A presumptive positive result was obtained on the submitted specimen  and confirmed on repeat testing.  While 2019 novel coronavirus  (SARS-CoV-2) nucleic acids may be present in the submitted sample  additional confirmatory testing may be necessary for epidemiological  and / or clinical management purposes  to differentiate between  SARS-CoV-2 and other Sarbecovirus currently known to infect humans.  If clinically indicated additional testing with an alternate test  methodology [phone])  is advised. The SARS-CoV-2 RNA is generally  detectable in upper and lower respiratory specimens during the acute  phase of infection. The expected result is Negative. Fact Sheet for Patients:  StrictlyIdeas.no Fact Sheet for Healthcare Providers: BankingDealers.co.za This test is not yet approved or cleared by the [place] FDA and has been authorized for detection and/or diagnosis of SARS-CoV-2 by FDA under an Emergency Use Authorization (EUA).  This EUA will remain in effect (meaning this test can be used) for the duration of the COVID-19 declaration under Section 564(b)(1) of the Act, 21 U.S.C. section 360bbb-3(b)(1), unless the authorization is terminated or revoked sooner. Performed at [facility], [place] [address] [place], [place] [address]   Blood Culture (routine x 2)     Status: None (Preliminary result)   Collection Time: 11/15/18 10:50 PM   Specimen: BLOOD  Result Value Ref Range Status   Specimen Description BLOOD RIGHT HAND  Final   Special Requests   Final    BOTTLES DRAWN AEROBIC AND ANAEROBIC Blood Culture adequate volume   Culture   Final    NO GROWTH 3 DAYS Performed at [facility], [place] [address] [place], [place] [address]    Report Status PENDING  Incomplete  Blood Culture (routine x 2)     Status: None (Preliminary  result)   Collection Time: 11/15/18 11:05 PM   Specimen: BLOOD  Result Value Ref Range Status   Specimen Description BLOOD LEFT HAND  Final   Special Requests   Final    BOTTLES DRAWN AEROBIC AND ANAEROBIC Blood Culture adequate volume   Culture  Final    NO GROWTH 3 DAYS Performed at [facility], [place] [address] [place], [place] [address]    Report Status PENDING  Incomplete    Radiology Reports Dg Chest Portable 1 View  Result Date: 11/15/2018 CLINICAL DATA:  Shortness of breath and cough. EXAM: PORTABLE CHEST 1 VIEW COMPARISON:  December 17, 2017 FINDINGS: The heart size is enlarged. The lung volumes are low. There are prominent interstitial lung markings bilaterally. There are a few ground-glass airspace opacities bilaterally. There is no pneumothorax. There is no definite large pleural effusion. No acute osseous abnormality. IMPRESSION: Findings concerning for an atypical infectious process versus pulmonary edema. Electronically Signed   By: Constance Holster M.D.   On: 11/15/2018 21:35

## 2018-11-20 NOTE — Progress Notes (Signed)
Occupational Therapy Treatment Patient Details Name: Mava Suares MRN: [id number] DOB: 1950-04-19 Today's Date: 11/20/2018    History of present illness Mrs. Ciocca is a 68 y.o. F with DM, HTN, thrombocytopenia and recurring interstitial lung disease NOS who presents with 3 weeks cough, now 3-4 days noticeable SOB and wheezing.hypoxic and + Covid-19   OT comments  Pt progressing towards established OT goals. However, continues to present with decreased activity tolerance. At rest, pt sitting in recliner on 2L O2 with SpO2 at 91%. Pt performing functional mobility in hallway with Min Guard A and RW requiring increased time and several standing rest breaks. Pt SpO2 dropping to low 80s on 4L O2 and required 6L O2 to complete mobility dropping to 80% by the end of walk. Pt requiring significant time (>8 minutes) to return to 90% on 6L; weaned down to 3L. Pt denies fatigue or SOB. Notified RN. Continue to recommend dc home once medically stable and will continue to follow acutely as admitted.    Follow Up Recommendations  No OT follow up;Supervision/Assistance - 24 hour    Equipment Recommendations  Other (comment)(RW)    Recommendations for Other Services PT consult    Precautions / Restrictions Precautions Precautions: Other (comment) Precaution Comments: watch sats, try to wean Restrictions Weight Bearing Restrictions: No       Mobility Bed Mobility               General bed mobility comments: Seated in recliner  Transfers Overall transfer level: Needs assistance Equipment used: None Transfers: Sit to/from Stand Sit to Stand: Min guard         General transfer comment: Min Guard A for safety    Balance Overall balance assessment: Needs assistance   Sitting balance-Leahy Scale: Normal       Standing balance-Leahy Scale: Fair Standing balance comment: able to place left foot onto toilet to wash up, held RW for support                           ADL  either performed or assessed with clinical judgement   ADL Overall ADL's : Needs assistance/impaired                         Toilet Transfer: Min guard;Ambulation;RW(Simulated to recliner) Armed forces technical officer Details (indicate cue type and reason): Min Guard A for safety         Functional mobility during ADLs: Min guard;Rolling walker General ADL Comments: Pt performing functional mobility in hallway to simulate home distance. At rest, pt SpO2 91% on 2L O2. Pt SpO2 maintaining >86% for first couple of feet on 4L and then dropping to 83%. Pt able to take standing rest break and purse lip breathing to elevate Spo2 back to 88% on 4L. Pt continuing mobility and required 6L O2 to return to room. Denies fatigue. Spo2 dropping to 80% on 6L at end of walk and required significant seated rest break to return to 90%. Weaned down to [employer] and [employer]. Used [employer] to monitor O2     Manufacturing systems engineer      Cognition Arousal/Alertness: Awake/alert Behavior During Therapy: WFL for tasks assessed/performed Overall Cognitive Status: Within Functional Limits for tasks assessed  Exercises     Shoulder Instructions       General Comments SpO2 dropping to 80% on 6L during activity.     Pertinent Vitals/ Pain       Pain Assessment: No/denies pain  Home Living                                          Prior Functioning/Environment              Frequency  Min 3X/week        Progress Toward Goals  OT Goals(current goals can now be found in the care plan section)  Progress towards OT goals: Progressing toward goals  Acute Rehab OT Goals Patient Stated Goal: to go home OT Goal Formulation: With patient Time For Goal Achievement: 12/03/18 Potential to Achieve Goals: Good ADL Goals Pt Will Perform Grooming: with modified independence;standing Pt Will Perform Lower Body Dressing:  with modified independence;sit to/from stand Pt Will Transfer to Toilet: with modified independence;ambulating;regular height toilet Pt Will Perform Toileting - Clothing Manipulation and hygiene: with modified independence;sitting/lateral leans;sit to/from stand Pt/caregiver will Perform Home Exercise Program: Increased strength;With theraband;Independently;With written HEP provided;Both right and left upper extremity Additional ADL Goal #1: Pt will independently verbalize three energy conservation strategies for ADLs/IADLs  Plan Discharge plan remains appropriate    Co-evaluation                 AM-PAC OT "6 Clicks" Daily Activity     Outcome Measure   Help from another person eating meals?: None Help from another person taking care of personal grooming?: A Little Help from another person toileting, which includes using toliet, bedpan, or urinal?: A Little Help from another person bathing (including washing, rinsing, drying)?: A Little Help from another person to put on and taking off regular upper body clothing?: A Little Help from another person to put on and taking off regular lower body clothing?: A Lot 6 Click Score: 18    End of Session Equipment Utilized During Treatment: Oxygen(6L HFNC)  OT Visit Diagnosis: Unsteadiness on feet (R26.81);Other abnormalities of gait and mobility (R26.89);Muscle weakness (generalized) (M62.81)   Activity Tolerance Patient tolerated treatment well   Patient Left in chair;with call bell/phone within reach;with nursing/sitter in room   Nurse Communication Mobility status        Time: 0634-9494 OT Time Calculation (min): 37 min  Charges: OT General Charges $OT Visit: 1 Visit OT Treatments $Self Care/Home Management : 8-22 mins $Therapeutic Activity: 8-22 mins  Kharma Sampsel MSOT, OTR/L Acute Rehab Pager: [phone] Office: [place] 11/20/2018, 4:16 PM

## 2018-11-20 NOTE — Progress Notes (Signed)
Family updated, they are happy about Kathy Irwin improvement and looking forward to her coming home in the next couple of days.

## 2018-11-20 NOTE — Progress Notes (Addendum)
Inpatient Diabetes Program Recommendations  AACE/ADA: New Consensus Statement on Inpatient Glycemic Control (2015)  Target Ranges:  Prepandial:   less than 140 mg/dL      Peak postprandial:   less than 180 mg/dL (1-2 hours)      Critically ill patients:  140 - 180 mg/dL   Results for Kathy Irwin, FILE (MRN [id number]) as of 11/20/2018 10:10  Ref. Range 11/19/2018 08:15 11/19/2018 11:55 11/19/2018 16:28 11/19/2018 21:16  Glucose-Capillary Latest Ref Range: 70 - 99 mg/dL 159 (H)  13 units NOVOLOG +  10 units LEVEMIR  264 (H)  20 units NOVOLOG  251 (H)  20 units NOVOLOG  259 (H)     15 units LEVEMIR   Results for Kathy Irwin, LASHLEY (MRN [id number]) as of 11/20/2018 10:10  Ref. Range 11/20/2018 07:50  Glucose-Capillary Latest Ref Range: 70 - 99 mg/dL 197 (H)    Home DM Meds: Humalog 75/25 Insulin 50 units AM/ 30 units PM       Metformin 1000 mg BID   Current Orders: Levemir 15 units BID      Novolog Resistant Correction Scale/ SSI (0-20 units) TID AC      Novolog 9 units TID with meals       MD- May consider increasing Novolog Meal Coverage while patient remains on IV steroids:  Increase to Novolog 12 units TID with meals  Per Home Med Rec, patient already taking Humalog 75/25 Insulin at home.  Current A1c of 7.6% shows good glucose control at home prior to hospitalization.     Solumedrol reduced to 40 mg BID this AM.  Novolog Meal Coverage dose just increased yest AM w/ Bfast.   Levemir increased yest as well (pt got total of 25 units Levemir yest--will get total of 30 units today).     --Will follow patient during hospitalization--  [name] RN, MSN, CDE Diabetes Coordinator Inpatient Glycemic Control Team Team Pager: [phone] (8a-5p)

## 2018-11-21 LAB — CULTURE, BLOOD (ROUTINE X 2)
Culture: NO GROWTH
Culture: NO GROWTH
Special Requests: ADEQUATE
Special Requests: ADEQUATE

## 2018-11-21 LAB — GLUCOSE, CAPILLARY
Glucose-Capillary: 118 mg/dL — ABNORMAL HIGH (ref 70–99)
Glucose-Capillary: 274 mg/dL — ABNORMAL HIGH (ref 70–99)
Glucose-Capillary: 277 mg/dL — ABNORMAL HIGH (ref 70–99)
Glucose-Capillary: 323 mg/dL — ABNORMAL HIGH (ref 70–99)

## 2018-11-21 MED ORDER — METHYLPREDNISOLONE SODIUM SUCC 40 MG IJ SOLR
40.0000 mg | Freq: Every day | INTRAMUSCULAR | Status: DC
  Administered 2018-11-22: 40 mg via INTRAVENOUS
  Filled 2018-11-21: qty 1

## 2018-11-21 MED ORDER — INSULIN DETEMIR 100 UNIT/ML ~~LOC~~ SOLN
12.0000 [IU] | Freq: Two times a day (BID) | SUBCUTANEOUS | Status: DC
  Administered 2018-11-21 (×2): 12 [IU] via SUBCUTANEOUS
  Filled 2018-11-21 (×3): qty 0.12

## 2018-11-21 NOTE — Progress Notes (Signed)
Pt rested well during the night. Ambulated to [facility], desated to 85 and recovered within 2 minutes to 92 on 4L HFNC.

## 2018-11-21 NOTE — Progress Notes (Signed)
PROGRESS NOTE                                                                                                                                                                                                             Patient Demographics:    Ivory Maduro, is a 68 y.o. female, DOB - 02-Nov-1950, BPP:[id number]  Outpatient Primary MD for the patient is Patient, No Pcp Per    LOS - 6  Admit date - 11/15/2018    Chief Complaint  Patient presents with  . Shortness of Breath  . Cough       Brief Narrative  Mrs. Kiesel is a 68 y.o. F with DM, HTN, thrombocytopenia and recurring interstitial lung disease NOS who presents with 3 weeks cough, now 3-4 days noticeable SOB and wheezing. In the ER, CXR with opacity, hypoxic and COVID+.     Subjective:    Patient in recliner, appears comfortable, denies any headache, no fever, no chest pain or pressure, no shortness of breath , no abdominal pain. No focal weakness.    Assessment  & Plan :      1. Acute Hypoxic Resp. Failure due to Acute Covid 19 Viral Pneumonitis during the ongoing 2020 Covid 19 Pandemic -  She has been treated with IV steroids, Remdisvir along with Actemra 2 doses appropriately at the time of admission.  Her hypoxia and overall clinical situation has improved she is down to 3 L nasal cannula oxygen and will continue to titrate it down, she uses 2 L oxygen at night at baseline.  Inflammatory markers are stabilizing.  Advance activity, encouraged to sit up in chair use flutter valve and I-S for pulmonary toiletry in the daytime and prone at night.  Prepare for discharge in the next 1 to 2 days.  Likely will go home on 2 L nasal cannula oxygen.  Start tapering steroids.  COVID-19 Labs  Recent Labs    11/18/18 0925 11/19/18 0650  CRP 9.2* 3.6*    Lab Results  Component Value Date   SARSCOV2NAA POSITIVE (A) 11/15/2018     Hepatic Function Latest Ref Rng & Units  11/20/2018 11/19/2018 11/18/2018  Total Protein 6.5 - 8.1 g/dL 5.9(L) 6.0(L) 7.0  Albumin 3.5 - 5.0 g/dL 3.5 3.4(L) 3.9  AST 15 - 41 U/L _0 ALT 0 - 44  U/L _0 Alk Phosphatase 38 - 126 U/L 48 46 51  Total Bilirubin 0.3 - 1.2 mg/dL 0.6 0.6 0.8        Component Value Date/Time   BNP 74.4 11/15/2018 2108      2.  Morbid obesity with OSA.  Uses 2 L oxygen at night, continue, outpatient PCP follow-up for weight loss.  3.  Mild COVID-19 infection related thrombocytopenia.  Stable and gradually rising.  4.  GERD.  On PPI.  5.  Essential hypertension -I on beta-blocker will add Norvasc and PRN IV hydralazine for better control.  6.  DM type II.  A1c was 7.6.  Metformin on hold.  Currently on Levemir and SSI along with pre-meal NovoLog.  Will provide insulin education prior to discharge.  May require short-term steroid use upon discharge as well.   CBG (last 3)  Recent Labs    11/20/18 1651 11/20/18 2052 11/21/18 0735  GLUCAP 208* 230* 118*      Condition - Fair  Family Communication  : Previous MD updated sister by phone.  Code Status :  Full  Diet :  Low Carb  Disposition Plan  :  Home in 1-2 days  Consults  :  None  Procedures  :     PUD Prophylaxis : PPI  DVT Prophylaxis  :  Lovenox   Lab Results  Component Value Date   PLT 77 (L) 11/20/2018    Inpatient Medications  Scheduled Meds: . albuterol  1 puff Inhalation Q6H  . amLODipine  10 mg Oral Daily  . enoxaparin (LOVENOX) injection  60 mg Subcutaneous Q24H  . gabapentin  300 mg Oral BID  . insulin aspart  0-20 Units Subcutaneous TID WC  . insulin aspart  9 Units Subcutaneous TID WC  . insulin detemir  15 Units Subcutaneous BID  . methylPREDNISolone (SOLU-MEDROL) injection  40 mg Intravenous Q12H  . metoprolol succinate  100 mg Oral Daily  . pantoprazole  40 mg Oral Daily  . pravastatin  10 mg Oral Daily  . sodium chloride flush  3 mL Intravenous Q12H  . vitamin C  500 mg Oral Daily  . zinc  sulfate  220 mg Oral Daily   Continuous Infusions:  PRN Meds:.acetaminophen, albuterol, ALPRAZolam, guaiFENesin-dextromethorphan, hydrALAZINE, HYDROcodone-acetaminophen, loratadine, montelukast, ondansetron **OR** ondansetron (ZOFRAN) IV, oxybutynin, phenol, sodium chloride, traZODone  Antibiotics  :    Anti-infectives (From admission, onward)   Start     Dose/Rate Route Frequency Ordered Stop   11/17/18 1000  remdesivir 100 mg in sodium chloride 0.9 % 250 mL IVPB     100 mg 500 mL/hr over 30 Minutes Intravenous Every 24 hours 11/16/18 0855 11/20/18 1021   11/16/18 1000  remdesivir 200 mg in sodium chloride 0.9 % 250 mL IVPB     200 mg 500 mL/hr over 30 Minutes Intravenous Once 11/16/18 0855 11/16/18 1115   11/15/18 2245  cefTRIAXone (ROCEPHIN) 2 g in sodium chloride 0.9 % 100 mL IVPB  Status:  Discontinued     2 g 200 mL/hr over 30 Minutes Intravenous Every 24 hours 11/15/18 2239 11/16/18 0910   11/15/18 2245  azithromycin (ZITHROMAX) 500 mg in sodium chloride 0.9 % 250 mL IVPB  Status:  Discontinued     500 mg 250 mL/hr over 60 Minutes Intravenous Every 24 hours 11/15/18 2239 11/16/18 0910       Time Spent in minutes  30   Lala Lund M.D on 11/21/2018 at 9:02 AM  To  page go to [email or web address] - password [facility]  [facility] -  Office  [phone]   See all Orders from today for further details    Objective:   Vitals:   11/20/18 1545 11/20/18 1945 11/21/18 0425 11/21/18 0700  BP: 131/72 116/60 (!) 141/80 (!) 146/79  Pulse: 62 70 (!) 50   Resp: 17     Temp: 97.6 F (36.4 C) 98.2 F (36.8 C) 97.6 F (36.4 C) 97.8 F (36.6 C)  TempSrc: Oral Oral Oral   SpO2: 92% 95% 98%   Weight:      Height:        Wt Readings from Last 3 Encounters:  11/15/18 117.9 kg  11/13/18 117.9 kg  12/17/17 119.3 kg     Intake/Output Summary (Last 24 hours) at 11/21/2018 0902 Last data filed at 11/21/2018 0655 Gross per 24 hour  Intake -  Output 900 ml  Net -900 ml      Physical Exam  Awake Alert, Oriented X 3, No new F.N deficits, Normal affect [place].AT,PERRAL Supple Neck,No JVD, No cervical lymphadenopathy appriciated.  Symmetrical Chest wall movement, Good air movement bilaterally, CTAB RRR,No Gallops, Rubs or new Murmurs, No Parasternal Heave +ve B.Sounds, Abd Soft, No tenderness, No organomegaly appriciated, No rebound - guarding or rigidity. No Cyanosis, Clubbing or edema, No new Rash or bruise    Data Review:    CBC Recent Labs  Lab 11/15/18 2108 11/16/18 0048 11/17/18 0815 11/18/18 0925 11/19/18 0650 11/20/18 0310  WBC 3.9*  --  3.6* 2.4* 3.1* 2.9*  HGB 14.4 15.6* 13.4 13.6 12.5 12.5  HCT 45.1 46.0 43.1 43.2 40.2 40.8  PLT 65*  --  62* 64* 69* 77*  MCV 96.8  --  97.5 98.4 98.5 98.1  MCH 30.9  --  30.3 31.0 30.6 30.0  MCHC 31.9  --  31.1 31.5 31.1 30.6  RDW 21.2*  --  21.5* 21.5* 20.5* 20.5*    Chemistries  Recent Labs  Lab 11/16/18 0024 11/16/18 0048 11/16/18 2040 11/18/18 0925 11/19/18 0650 11/20/18 0310  NA 135 139 140 139 136 137  K 5.4* 4.1 4.5 5.1 4.9 4.6  CL 104  --  104 104 101 102  CO2 20*  --  _0 GLUCOSE 227*  --  217* 220* 204* 327*  BUN 12  --  _1 CREATININE 0.72  --  0.81 0.72 0.62 0.65  CALCIUM 9.3  --  10.1 9.5 9.5 9.6  MG  --   --  1.7  --   --   --   AST 56*  --   --  _2 ALT 21  --   --  _3 ALKPHOS 58  --   --  51 46 48  BILITOT 1.1  --   --  0.8 0.6 0.6   ------------------------------------------------------------------------------------------------------------------ No results for input(s): CHOL, HDL, LDLCALC, TRIG, CHOLHDL, LDLDIRECT in the last 72 hours.  Lab Results  Component Value Date   HGBA1C 7.6 (H) 11/16/2018   ------------------------------------------------------------------------------------------------------------------ No results for input(s): TSH, T4TOTAL, T3FREE, THYROIDAB in the last 72 hours.  Invalid input(s): FREET3  Cardiac Enzymes No  results for input(s): CKMB, TROPONINI, MYOGLOBIN in the last 168 hours.  Invalid input(s): CK ------------------------------------------------------------------------------------------------------------------    Component Value Date/Time   BNP 74.4 11/15/2018 2108    Micro Results Recent Results (from the past 240 hour(s))  SARS Coronavirus 2 [facility] order, Performed in [facility]  hospital lab) Nasopharyngeal Nasopharyngeal Swab     Status: Abnormal   Collection Time: 11/15/18  9:49 PM   Specimen: Nasopharyngeal Swab  Result Value Ref Range Status   SARS Coronavirus 2 POSITIVE (A) NEGATIVE Final    Comment: RESULT CALLED TO, READ BACK BY AND VERIFIED WITH: M. BROOKS,RN 2308 11/15/2018 T. TYSOR (NOTE) If result is NEGATIVE SARS-CoV-2 target nucleic acids are NOT DETECTED. The SARS-CoV-2 RNA is generally detectable in upper and lower  respiratory specimens during the acute phase of infection. The lowest  concentration of SARS-CoV-2 viral copies this assay can detect is 250  copies / mL. A negative result does not preclude SARS-CoV-2 infection  and should not be used as the sole basis for treatment or other  patient management decisions.  A negative result may occur with  improper specimen collection / handling, submission of specimen other  than nasopharyngeal swab, presence of viral mutation(s) within the  areas targeted by this assay, and inadequate number of viral copies  (<250 copies / mL). A negative result must be combined with clinical  observations, patient history, and epidemiological information. If result is POSITIVE SARS-CoV-2 target nucleic acids are DETECTED.  The SARS-CoV-2 RNA is generally detectable in upper and lower  respiratory specimens during the acute phase of infection.  Positive  results are indicative of active infection with SARS-CoV-2.  Clinical  correlation with patient history and other diagnostic information is  necessary to determine patient  infection status.  Positive results do  not rule out bacterial infection or co-infection with other viruses. If result is PRESUMPTIVE POSTIVE SARS-CoV-2 nucleic acids MAY BE PRESENT.   A presumptive positive result was obtained on the submitted specimen  and confirmed on repeat testing.  While 2019 novel coronavirus  (SARS-CoV-2) nucleic acids may be present in the submitted sample  additional confirmatory testing may be necessary for epidemiological  and / or clinical management purposes  to differentiate between  SARS-CoV-2 and other Sarbecovirus currently known to infect humans.  If clinically indicated additional testing with an alternate test  methodology [id number])  is advised. The SARS-CoV-2 RNA is generally  detectable in upper and lower respiratory specimens during the acute  phase of infection. The expected result is Negative. Fact Sheet for Patients:  StrictlyIdeas.no Fact Sheet for Healthcare Providers: BankingDealers.co.za This test is not yet approved or cleared by the [place] FDA and has been authorized for detection and/or diagnosis of SARS-CoV-2 by FDA under an Emergency Use Authorization (EUA).  This EUA will remain in effect (meaning this test can be used) for the duration of the COVID-19 declaration under Section 564(b)(1) of the Act, 21 U.S.C. section 360bbb-3(b)(1), unless the authorization is terminated or revoked sooner. Performed at [facility], [place] [address] [place], [place] [address]   Blood Culture (routine x 2)     Status: None (Preliminary result)   Collection Time: 11/15/18 10:50 PM   Specimen: BLOOD  Result Value Ref Range Status   Specimen Description BLOOD RIGHT HAND  Final   Special Requests   Final    BOTTLES DRAWN AEROBIC AND ANAEROBIC Blood Culture adequate volume   Culture   Final    NO GROWTH 4 DAYS Performed at [facility], [place] [address], [place], [place] [address]     Report Status PENDING  Incomplete  Blood Culture (routine x 2)     Status: None (Preliminary result)   Collection Time: 11/15/18 11:05 PM   Specimen: BLOOD  Result  Value Ref Range Status   Specimen Description BLOOD LEFT HAND  Final   Special Requests   Final    BOTTLES DRAWN AEROBIC AND ANAEROBIC Blood Culture adequate volume   Culture   Final    NO GROWTH 4 DAYS Performed at [facility], [address] [place], [place] [address]    Report Status PENDING  Incomplete    Radiology Reports Dg Chest Portable 1 View  Result Date: 11/15/2018 CLINICAL DATA:  Shortness of breath and cough. EXAM: PORTABLE CHEST 1 VIEW COMPARISON:  December 17, 2017 FINDINGS: The heart size is enlarged. The lung volumes are low. There are prominent interstitial lung markings bilaterally. There are a few ground-glass airspace opacities bilaterally. There is no pneumothorax. There is no definite large pleural effusion. No acute osseous abnormality. IMPRESSION: Findings concerning for an atypical infectious process versus pulmonary edema. Electronically Signed   By: Constance Holster M.D.   On: 11/15/2018 21:35

## 2018-11-22 LAB — GLUCOSE, CAPILLARY
Glucose-Capillary: 95 mg/dL (ref 70–99)
Glucose-Capillary: 262 mg/dL — ABNORMAL HIGH (ref 70–99)

## 2018-11-22 MED ORDER — GUAIFENESIN-CODEINE 100-10 MG/5ML PO SOLN: 10.0000 mL | Freq: Four times a day (QID) | ORAL | 0 refills | Status: DC | PRN

## 2018-11-22 MED ORDER — INSULIN ASPART 100 UNIT/ML ~~LOC~~ SOLN: 0.0000 [IU] | Freq: Every day | SUBCUTANEOUS | Status: DC

## 2018-11-22 MED ORDER — BLOOD GLUCOSE MONITOR KIT: PACK | 1 refills | Status: DC

## 2018-11-22 MED ORDER — PREDNISONE 5 MG PO TABS: ORAL_TABLET | ORAL | 0 refills | Status: DC

## 2018-11-22 MED ORDER — INSULIN ASPART 100 UNIT/ML ~~LOC~~ SOLN
0.0000 [IU] | Freq: Three times a day (TID) | SUBCUTANEOUS | Status: DC
  Administered 2018-11-22: 8 [IU] via SUBCUTANEOUS

## 2018-11-22 MED ORDER — FREESTYLE LITE TEST VI STRP: ORAL_STRIP | 0 refills | Status: DC

## 2018-11-22 MED ORDER — INSULIN DETEMIR 100 UNIT/ML ~~LOC~~ SOLN
15.0000 [IU] | Freq: Two times a day (BID) | SUBCUTANEOUS | Status: DC
  Administered 2018-11-22: 15 [IU] via SUBCUTANEOUS
  Filled 2018-11-22: qty 0.15

## 2018-11-22 MED ORDER — INSULIN ASPART 100 UNIT/ML ~~LOC~~ SOLN: SUBCUTANEOUS | 0 refills | Status: DC

## 2018-11-22 NOTE — Progress Notes (Signed)
Inpatient Diabetes Program Recommendations  AACE/ADA: New Consensus Statement on Inpatient Glycemic Control (2015)  Target Ranges:  Prepandial:   less than 140 mg/dL      Peak postprandial:   less than 180 mg/dL (1-2 hours)      Critically ill patients:  140 - 180 mg/dL   Results for Kathy Irwin, Kathy Irwin (MRN [id number]) as of 11/22/2018 09:07  Ref. Range 11/21/2018 07:35 11/21/2018 11:08 11/21/2018 16:06 11/21/2018 21:18 11/22/2018 07:25  Glucose-Capillary Latest Ref Range: 70 - 99 mg/dL 118 (H) 274 (H) 277 (H) 323 (H) 95    Home DM Meds: Humalog 75/25 Insulin 50 units AM/ 30 units PM       Metformin 1000 mg BID   Current Orders: Levemir 15 units BID      Novolog Resistant Correction Scale/ SSI (0-20 units) TID AC      Novolog 9 units TID with meals   Solumedrol decreased to 40 mg Daily  MD- May consider adding Novolog meal coverage as postprandial glucose trends increase in the 300's and fasting is at goal.  Per Home Med Rec, patient already taking Humalog 75/25 Insulin at home, with meal coverage component.  Current A1c of 7.6% shows good glucose control at home prior to hospitalization.   --Will follow patient during hospitalization--  Tama Headings RN, MSN, BC-ADM Inpatient Diabetes Coordinator Team Pager [phone] (8a-5p)

## 2018-11-22 NOTE — Discharge Summary (Signed)
Kathy Irwin RCO:[id number] DOB: April 27, 1950 DOA: 11/15/2018  PCP: Patient, No Pcp Per  Admit date: 11/15/2018  Discharge date: 11/22/2018  Admitted From: Home Disposition:  Home   Recommendations for Outpatient Follow-up:   Follow up with PCP in 1-2 weeks  PCP Please obtain BMP/CBC, 2 view CXR in 1week,  (see Discharge instructions)   PCP Please follow up on the following pending results:    Home Health: None   Equipment/Devices: 3 L nasal cannula oxygen, rolling walker 5 wheeled Consultations: None  Discharge Condition: Stable    CODE STATUS: Full    Diet Recommendation: Heart Healthy Low Carb    Chief Complaint  Patient presents with   Shortness of Breath   Cough     Brief history of present illness from the day of admission and additional interim summary    Kathy Irwin is a68 y.o.F with DM, HTN, thrombocytopenia and recurring interstitial lung disease NOS who presents with 3 weeks cough, now 3-4 days noticeable SOB and wheezing. In the ER, CXR with opacity, hypoxic and COVID+.                                                                  Hospital Course    1. Acute Hypoxic Resp. Failure due to Acute Covid 19 Viral Pneumonitis during the ongoing 2020 Covid 19 Pandemic -  She has been treated with IV steroids, Remdisvir along with Actemra 2 doses appropriately at the time of admission.  Her hypoxia and overall clinical situation has improved she is down to 3 L nasal cannula oxygen and symptom-free on that, she uses 2 L oxygen at night at baseline.  Her inflammatory markers have stabilized and she will be today discharged home on a short course of steroid taper, continue 3 L nasal cannula oxygen at home and daytime for now thereafter follow with PCP and titrate down if tolerated, she uses 2 L at night  chronically and thinks that she was supposed to be on daytime oxygen as well [place].  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  2.  Morbid obesity with OSA.  Uses 2 L oxygen at night, continue, outpatient PCP follow-up for weight loss.  3.  Mild COVID-19 infection related thrombocytopenia.  Stable and gradually rising.  Pete CBC in a week by PCP.  4.  GERD.  On PPI.  5.  Essential hypertension -I on beta-blocker will add Norvasc and PRN IV hydralazine for better control.  6.  DM type II.  A1c was 7.6.    Resume home regimen since she is on short course of steroid taper  Humalog sliding scale will be provided.  She already takes NovoLog insulin at home.   Discharge diagnosis     Principal Problem:   Pneumonia due to COVID-19 virus Active Problems:   DM (diabetes mellitus) ([place])   HTN (hypertension)   Acute respiratory failure with hypoxia ([place])   Thrombocytopenia ([place])    Discharge instructions    Discharge Instructions    Diet - low sodium heart healthy   Complete by: As directed    Discharge instructions   Complete by: As directed    Follow with Primary MD in 7 days   Get CBC, CMP, 2 view Chest X ray -  checked next visit within 1 week by Primary MD   Activity: As tolerated with Full fall precautions use walker/cane & assistance as needed  Disposition Home    Diet: Heart Healthy Low Carb.  Accuchecks 4 times/day, Once in AM empty stomach and then before each meal. Log in all results and show them to your Prim.MD in 3 days. If any glucose reading is under 80 or above 300 call your Prim MD immidiately. Follow Low glucose instructions for glucose under 80 as instructed.   Special Instructions: If you have smoked or chewed Tobacco  in the last 2 yrs please stop smoking, stop any  regular Alcohol  and or any Recreational drug use.  On your next visit with your primary care physician please Get Medicines reviewed and adjusted.  Please request your Prim.MD to go over all Hospital Tests and Procedure/Radiological results at the follow up, please get all Hospital records sent to your Prim MD by signing hospital release before you go home.  If you experience worsening of your admission symptoms, develop shortness of breath, life threatening emergency, suicidal or homicidal thoughts you must seek medical attention immediately by calling 911 or calling your MD immediately  if symptoms less severe.  You Must read complete instructions/literature along with all the possible adverse reactions/side effects for all the Medicines you take and that have been prescribed to you. Take any new Medicines after you have completely understood and accpet all the possible adverse reactions/side effects.   MyChart COVID-19 home monitoring program   Complete by: Nov 22, 2018    Is the patient willing to use the [facility] for home monitoring?: Yes   Temperature monitoring   Complete by: Nov 22, 2018    After how many days would you like to receive a notification of this patient's flowsheet entries?: 1      Discharge Medications   Allergies as of 11/22/2018      Reactions   Other Shortness Of Breath, Other (See Comments)   Newspaper ink =  new chest pain, also   Iodine Other (See Comments)   "Was a long time ago" (Reaction??)   Merbromin Other (See Comments)   Mercurochrome- "Was a long time ago" (Reaction??)      Medication List    STOP taking these medications   pravastatin 10 MG tablet Commonly known as: PRAVACHOL   predniSONE 20 MG tablet Commonly known as: DELTASONE Replaced by: predniSONE 5 MG tablet   promethazine-dextromethorphan 6.25-15 MG/5ML syrup Commonly known as: PROMETHAZINE-DM     TAKE these medications   acetaminophen 500 MG tablet Commonly known as:  TYLENOL Take 1,000 mg by mouth 2 (two) times daily as needed (for pain).   atorvastatin 20 MG tablet Commonly known as: LIPITOR Take 20 mg by mouth at bedtime.   blood glucose meter kit  and supplies Kit Dispense based on patient and insurance preference. Use up to four times daily as directed. (FOR ICD-9 250.00, 250.01). For QAC - HS accuchecks.   DYMISTA NA Place 1-2 sprays into both nostrils daily as needed (for allergies).   FREESTYLE LITE test strip Generic drug: glucose blood For glucose testing every before meals at bedtime. Diagnosis E 11.65  Can substitute to any accepted brand   gabapentin 300 MG capsule Commonly known as: NEURONTIN Take 300 mg by mouth 2 (two) times daily.   guaiFENesin-codeine 100-10 MG/5ML syrup Take 10 mLs by mouth every 6 (six) hours as needed for cough.   HumaLOG Mix 75/25 KwikPen (75-25) 100 UNIT/ML Kwikpen Generic drug: Insulin Lispro Prot & Lispro Inject 40 Units into the skin See admin instructions. Inject 50 units into the skin before breakfast "and a second dose at bedtime 30 units   insulin aspart 100 UNIT/ML injection Commonly known as: NovoLOG Before each meal 3 times a day , 140-199 - 2 units, 200-250 - 4 units, 251-299 - 6 units,  300-349 - 8 units,  350 or above 10 units. Insulin PEN if approved, provide syringes and needles if needed. Can switch to any other cheaper alternative.   loratadine 10 MG tablet Commonly known as: CLARITIN Take 10 mg by mouth daily as needed for allergies or rhinitis.   metFORMIN 1000 MG tablet Commonly known as: GLUCOPHAGE Take 1,000 mg by mouth See admin instructions. Take 1,000 mg by mouth in the morning with breakfast "and a second dose of 1,000 mg daily if BGL is still elevated"   metoprolol 200 MG 24 hr tablet Commonly known as: TOPROL-XL Take 200 mg by mouth 2 (two) times daily.   MiraLax 17 GM/SCOOP powder Generic drug: polyethylene glycol powder Take 17 g by mouth daily as needed for mild  constipation.   montelukast 10 MG tablet Commonly known as: SINGULAIR Take 10 mg by mouth at bedtime as needed (for seasonal allergies).   olmesartan 40 MG tablet Commonly known as: BENICAR Take 40 mg by mouth daily.   omeprazole 20 MG capsule Commonly known as: PRILOSEC Take 20 mg by mouth daily.   oxybutynin 10 MG 24 hr tablet Commonly known as: DITROPAN-XL Take 10 mg by mouth daily as needed (for symptoms of bladder instability).   predniSONE 5 MG tablet Commonly known as: DELTASONE Label  & dispense according to the schedule below. Take 4 Pills PO for 3 days, 2 Pills PO for 3 days, 1 Pills PO for 3 days, 1/2 Pill  PO for 3 days then STOP. Total 45 pills. Replaces: predniSONE 20 MG tablet   ProAir HFA 108 (90 Base) MCG/ACT inhaler Generic drug: albuterol Inhale 2 puffs into the lungs every 6 (six) hours as needed for wheezing or shortness of breath.   albuterol (2.5 MG/3ML) 0.083% nebulizer solution Commonly known as: PROVENTIL Take 3 mLs (2.5 mg total) by nebulization every 6 (six) hours as needed for wheezing or shortness of breath.   ranitidine 150 MG tablet Commonly known as: ZANTAC Take 150 mg by mouth at bedtime.   traMADol-acetaminophen 37.5-325 MG tablet Commonly known as: ULTRACET Take 1 tablet by mouth every 6 (six) hours as needed (for pain).   Vitamin D (Ergocalciferol) 1.25 MG (50000 UT) Caps capsule Commonly known as: DRISDOL Take 50,000 Units by mouth every Thursday.            Durable Medical Equipment  (From admission, onward)  Start     Ordered   11/22/18 0953  For home use only DME oxygen  Once    Question Answer Comment  Length of Need 6 Months   Mode or (Route) Nasal cannula   Liters per Minute 3   Frequency Continuous (stationary and portable oxygen unit needed)   Oxygen conserving device Yes   Oxygen delivery system Gas      11/22/18 0952   11/20/18 0734  For home use only DME Walker rolling  Once    Comments: 5 wheel   Question:  Patient needs a walker to treat with the following condition  Answer:  Weakness   11/20/18 0733          Follow-up Information    [facility]. Schedule an appointment as soon as possible for a visit in 1 week(s).   Contact information: [address] [place] [address] [phone]          Major procedures and Radiology Reports - PLEASE review detailed and final reports thoroughly  -         Dg Chest Portable 1 View  Result Date: 11/15/2018 CLINICAL DATA:  Shortness of breath and cough. EXAM: PORTABLE CHEST 1 VIEW COMPARISON:  December 17, 2017 FINDINGS: The heart size is enlarged. The lung volumes are low. There are prominent interstitial lung markings bilaterally. There are a few ground-glass airspace opacities bilaterally. There is no pneumothorax. There is no definite large pleural effusion. No acute osseous abnormality. IMPRESSION: Findings concerning for an atypical infectious process versus pulmonary edema. Electronically Signed   By: Constance Holster M.D.   On: 11/15/2018 21:35    Micro Results     Recent Results (from the past 240 hour(s))  SARS Coronavirus 2 [facility] order, Performed in [facility] hospital lab) Nasopharyngeal Nasopharyngeal Swab     Status: Abnormal   Collection Time: 11/15/18  9:49 PM   Specimen: Nasopharyngeal Swab  Result Value Ref Range Status   SARS Coronavirus 2 POSITIVE (A) NEGATIVE Final    Comment: RESULT CALLED TO, READ BACK BY AND VERIFIED WITH: M. BROOKS,RN 2308 11/15/2018 T. TYSOR (NOTE) If result is NEGATIVE SARS-CoV-2 target nucleic acids are NOT DETECTED. The SARS-CoV-2 RNA is generally detectable in upper and lower  respiratory specimens during the acute phase of infection. The lowest  concentration of SARS-CoV-2 viral copies this assay can detect is 250  copies / mL. A negative result does not preclude SARS-CoV-2 infection  and should not be used as the  sole basis for treatment or other  patient management decisions.  A negative result may occur with  improper specimen collection / handling, submission of specimen other  than nasopharyngeal swab, presence of viral mutation(s) within the  areas targeted by this assay, and inadequate number of viral copies  (<250 copies / mL). A negative result must be combined with clinical  observations, patient history, and epidemiological information. If result is POSITIVE SARS-CoV-2 target nucleic acids are DETECTED.  The SARS-CoV-2 RNA is generally detectable in upper and lower  respiratory specimens during the acute phase of infection.  Positive  results are indicative of active infection with SARS-CoV-2.  Clinical  correlation with patient history and other diagnostic information is  necessary to determine patient infection status.  Positive results do  not rule out bacterial infection or co-infection with other viruses. If result is PRESUMPTIVE POSTIVE SARS-CoV-2 nucleic acids MAY BE PRESENT.   A presumptive positive result was obtained on  the submitted specimen  and confirmed on repeat testing.  While 2019 novel coronavirus  (SARS-CoV-2) nucleic acids may be present in the submitted sample  additional confirmatory testing may be necessary for epidemiological  and / or clinical management purposes  to differentiate between  SARS-CoV-2 and other Sarbecovirus currently known to infect humans.  If clinically indicated additional testing with an alternate test  methodology [phone])  is advised. The SARS-CoV-2 RNA is generally  detectable in upper and lower respiratory specimens during the acute  phase of infection. The expected result is Negative. Fact Sheet for Patients:  StrictlyIdeas.no Fact Sheet for Healthcare Providers: BankingDealers.co.za This test is not yet approved or cleared by the [place] FDA and has been authorized for detection  and/or diagnosis of SARS-CoV-2 by FDA under an Emergency Use Authorization (EUA).  This EUA will remain in effect (meaning this test can be used) for the duration of the COVID-19 declaration under Section 564(b)(1) of the Act, 21 U.S.C. section 360bbb-3(b)(1), unless the authorization is terminated or revoked sooner. Performed at [facility], [place] [address], [place], [place] [address]   Blood Culture (routine x 2)     Status: None   Collection Time: 11/15/18 10:50 PM   Specimen: BLOOD  Result Value Ref Range Status   Specimen Description BLOOD RIGHT HAND  Final   Special Requests   Final    BOTTLES DRAWN AEROBIC AND ANAEROBIC Blood Culture adequate volume   Culture   Final    NO GROWTH 5 DAYS Performed at [facility], [place] [address], [place], [place] [address]    Report Status 11/21/2018 FINAL  Final  Blood Culture (routine x 2)     Status: None   Collection Time: 11/15/18 11:05 PM   Specimen: BLOOD  Result Value Ref Range Status   Specimen Description BLOOD LEFT HAND  Final   Special Requests   Final    BOTTLES DRAWN AEROBIC AND ANAEROBIC Blood Culture adequate volume   Culture   Final    NO GROWTH 5 DAYS Performed at [facility], [place] [address], [place], [place] [address]    Report Status 11/21/2018 FINAL  Final    Today   Subjective    Kathy Irwin today has no headache,no chest abdominal pain,no new weakness tingling or numbness, feels much better wants to go home today.     Objective   Blood pressure 134/73, pulse 72, temperature 98.7 F (37.1 C), temperature source Oral, resp. Rate 19, height _0  (1.626 m), weight 117.9 kg, SpO2 95 %.   Intake/Output Summary (Last 24 hours) at 11/22/2018 1002 Last data filed at 11/22/2018 0527 Gross per 24 hour  Intake 480 ml  Output 600 ml  Net -120 ml    Exam  Awake Alert, Oriented x 3, No new F.N deficits, Normal affect [place].AT,PERRAL Supple Neck,No JVD, No cervical lymphadenopathy appriciated.   Symmetrical Chest wall movement, Good air movement bilaterally, CTAB RRR,No Gallops,Rubs or new Murmurs, No Parasternal Heave +ve B.Sounds, Abd Soft, Non tender, No organomegaly appriciated, No rebound -guarding or rigidity. No Cyanosis, Clubbing or edema, No new Rash or bruise   Data Review   CBC w Diff:  Lab Results  Component Value Date   WBC 2.9 (L) 11/20/2018   HGB 12.5 11/20/2018   HCT 40.8 11/20/2018   PLT 77 (L) 11/20/2018    CMP:  Lab Results  Component Value Date   NA 137 11/20/2018   K 4.6 11/20/2018  CL 102 11/20/2018   CO2 30 11/20/2018   BUN 21 11/20/2018   CREATININE 0.65 11/20/2018   PROT 5.9 (L) 11/20/2018   ALBUMIN 3.5 11/20/2018   BILITOT 0.6 11/20/2018   ALKPHOS 48 11/20/2018   AST 17 11/20/2018   ALT 17 11/20/2018  . Lab Results  Component Value Date   HGBA1C 7.6 (H) 11/16/2018    COVID-19 Labs  No results for input(s): DDIMER, FERRITIN, LDH, CRP in the last 72 hours.  Lab Results  Component Value Date   SARSCOV2NAA POSITIVE (A) 11/15/2018     Total Time in preparing paper work, data evaluation and todays exam - 69 minutes  Lala Lund M.D on 11/22/2018 at 10:02 AM  [facility]   Office  [phone]

## 2018-11-22 NOTE — Discharge Instructions (Signed)
Follow with Primary MD in 7 days   Get CBC, CMP, 2 view Chest X ray -  checked next visit within 1 week by Primary MD   Activity: As tolerated with Full fall precautions use walker/cane & assistance as needed  Disposition Home    Diet: Heart Healthy Low Carb.  Accuchecks 4 times/day, Once in AM empty stomach and then before each meal. Log in all results and show them to your Prim.MD in 3 days. If any glucose reading is under 80 or above 300 call your Prim MD immidiately. Follow Low glucose instructions for glucose under 80 as instructed.   Special Instructions: If you have smoked or chewed Tobacco  in the last 2 yrs please stop smoking, stop any regular Alcohol  and or any Recreational drug use.  On your next visit with your primary care physician please Get Medicines reviewed and adjusted.  Please request your Prim.MD to go over all Hospital Tests and Procedure/Radiological results at the follow up, please get all Hospital records sent to your Prim MD by signing hospital release before you go home.  If you experience worsening of your admission symptoms, develop shortness of breath, life threatening emergency, suicidal or homicidal thoughts you must seek medical attention immediately by calling 911 or calling your MD immediately  if symptoms less severe.  You Must read complete instructions/literature along with all the possible adverse reactions/side effects for all the Medicines you take and that have been prescribed to you. Take any new Medicines after you have completely understood and accpet all the possible adverse reactions/side effects.      Person Under Monitoring Name: Kathy Irwin  Location: [place] [address]   Infection Prevention Recommendations for Individuals Confirmed to have, or Being Evaluated for, 2019 Novel Coronavirus (COVID-19) Infection Who Receive Care at Home  Individuals who are confirmed to have, or are being evaluated for,  COVID-19 should follow the prevention steps below until a healthcare provider or local or state health department says they can return to normal activities.  Stay home except to get medical care You should restrict activities outside your home, except for getting medical care. Do not go to work, school, or public areas, and do not use public transportation or taxis.  Call ahead before visiting your doctor Before your medical appointment, call the healthcare provider and tell them that you have, or are being evaluated for, COVID-19 infection. This will help the healthcare providers office take steps to keep other people from getting infected. Ask your healthcare provider to call the local or state health department.  Monitor your symptoms Seek prompt medical attention if your illness is worsening (e.g., difficulty breathing). Before going to your medical appointment, call the healthcare provider and tell them that you have, or are being evaluated for, COVID-19 infection. Ask your healthcare provider to call the local or state health department.  Wear a facemask You should wear a facemask that covers your nose and mouth when you are in the same room with other people and when you visit a healthcare provider. People who live with or visit you should also wear a facemask while they are in the same room with you.  Separate yourself from other people in your home As much as possible, you should stay in a different room from other people in your home. Also, you should use a separate bathroom, if available.  Avoid sharing household items You should not share dishes, drinking glasses, cups, eating utensils, towels,  bedding, or other items with other people in your home. After using these items, you should wash them thoroughly with soap and water.  Cover your coughs and sneezes Cover your mouth and nose with a tissue when you cough or sneeze, or you can cough or sneeze into your sleeve.  Throw used tissues in a lined trash can, and immediately wash your hands with soap and water for at least 20 seconds or use an alcohol-based hand rub.  Wash your [facility] your hands often and thoroughly with soap and water for at least 20 seconds. You can use an alcohol-based hand sanitizer if soap and water are not available and if your hands are not visibly dirty. Avoid touching your eyes, nose, and mouth with unwashed hands.   Prevention Steps for Caregivers and Household Members of Individuals Confirmed to have, or Being Evaluated for, COVID-19 Infection Being Cared for in the Home  If you live with, or provide care at home for, a person confirmed to have, or being evaluated for, COVID-19 infection please follow these guidelines to prevent infection:  Follow healthcare providers instructions Make sure that you understand and can help the patient follow any healthcare provider instructions for all care.  Provide for the patients basic needs You should help the patient with basic needs in the home and provide support for getting groceries, prescriptions, and other personal needs.  Monitor the patients symptoms If they are getting sicker, call his or her medical provider and tell them that the patient has, or is being evaluated for, COVID-19 infection. This will help the healthcare providers office take steps to keep other people from getting infected. Ask the healthcare provider to call the local or state health department.  Limit the number of people who have contact with the patient  If possible, have only one caregiver for the patient.  Other household members should stay in another home or place of residence. If this is not possible, they should stay  in another room, or be separated from the patient as much as possible. Use a separate bathroom, if available.  Restrict visitors who do not have an essential need to be in the home.  Keep older adults, very young  children, and other sick people away from the patient Keep older adults, very young children, and those who have compromised immune systems or chronic health conditions away from the patient. This includes people with chronic heart, lung, or kidney conditions, diabetes, and cancer.  Ensure good ventilation Make sure that shared spaces in the home have good air flow, such as from an air conditioner or an opened window, weather permitting.  Wash your hands often  Wash your hands often and thoroughly with soap and water for at least 20 seconds. You can use an alcohol based hand sanitizer if soap and water are not available and if your hands are not visibly dirty.  Avoid touching your eyes, nose, and mouth with unwashed hands.  Use disposable paper towels to dry your hands. If not available, use dedicated cloth towels and replace them when they become wet.  Wear a facemask and gloves  Wear a disposable facemask at all times in the room and gloves when you touch or have contact with the patients blood, body fluids, and/or secretions or excretions, such as sweat, saliva, sputum, nasal mucus, vomit, urine, or feces.  Ensure the mask fits over your nose and mouth tightly, and do not touch it during use.  Throw out disposable facemasks and  gloves after using them. Do not reuse.  Wash your hands immediately after removing your facemask and gloves.  If your personal clothing becomes contaminated, carefully remove clothing and launder. Wash your hands after handling contaminated clothing.  Place all used disposable facemasks, gloves, and other waste in a lined container before disposing them with other household waste.  Remove gloves and wash your hands immediately after handling these items.  Do not share dishes, glasses, or other household items with the patient  Avoid sharing household items. You should not share dishes, drinking glasses, cups, eating utensils, towels, bedding, or other items  with a patient who is confirmed to have, or being evaluated for, COVID-19 infection.  After the person uses these items, you should wash them thoroughly with soap and water.  Wash laundry thoroughly  Immediately remove and wash clothes or bedding that have blood, body fluids, and/or secretions or excretions, such as sweat, saliva, sputum, nasal mucus, vomit, urine, or feces, on them.  Wear gloves when handling laundry from the patient.  Read and follow directions on labels of laundry or clothing items and detergent. In general, wash and dry with the warmest temperatures recommended on the label.  Clean all areas the individual has used often  Clean all touchable surfaces, such as counters, tabletops, doorknobs, bathroom fixtures, toilets, phones, keyboards, tablets, and bedside tables, every day. Also, clean any surfaces that may have blood, body fluids, and/or secretions or excretions on them.  Wear gloves when cleaning surfaces the patient has come in contact with.  Use a diluted bleach solution (e.g., dilute bleach with 1 part bleach and 10 parts water) or a household disinfectant with a label that says EPA-registered for coronaviruses. To make a bleach solution at home, add 1 tablespoon of bleach to 1 quart (4 cups) of water. For a larger supply, add  cup of bleach to 1 gallon (16 cups) of water.  Read labels of cleaning products and follow recommendations provided on product labels. Labels contain instructions for safe and effective use of the cleaning product including precautions you should take when applying the product, such as wearing gloves or eye protection and making sure you have good ventilation during use of the product.  Remove gloves and wash hands immediately after cleaning.  Monitor yourself for signs and symptoms of illness Caregivers and household members are considered close contacts, should monitor their health, and will be asked to limit movement outside of the  home to the extent possible. Follow the monitoring steps for close contacts listed on the symptom monitoring form.   ? If you have additional questions, contact your local health department or call the epidemiologist on call at [phone] (available 24/7). ? This guidance is subject to change. For the most up-to-date guidance from [facility], please refer to their website: [email or web address]

## 2018-11-22 NOTE — TOC Initial Note (Signed)
Transition of Care [facility]) - Initial/Assessment Note    Patient Details  Name: Kathy Irwin MRN: [id number] Date of Birth: 1950/05/01  Transition [facility] CM/SW Contact:    Weston Anna, LCSW Phone Number: 11/22/2018, 11:51 AM  Clinical Narrative:                  Patient is set to discharge home today- DME needs for o2 has been completed with [employer]. Oxygen will be delivered to patients room before leaving hospital. No other needs at this time.   Referral completed with Magda Paganini and all orders placed by MD.    Expected Discharge Plan: Home/Self Care Barriers to Discharge: No Barriers Identified   Patient Goals and CMS Choice   CMS Medicare.gov Compare Post Acute Care list provided to:: Patient Choice offered to / list presented to : Patient  Expected Discharge Plan and Services Expected Discharge Plan: Home/Self Care In-house Referral: Clinical Social Work Discharge Planning Services: NA Post Acute Care Choice: Durable Medical Equipment Living arrangements for the past 2 months: Single Family Home Expected Discharge Date: 11/22/18               DME Arranged: Oxygen DME Agency: [employer] Date DME Agency Contacted: 11/22/18 Time DME Agency Contacted: 55 Representative spoke with at DME Agency: Magda Paganini HH Arranged: NA          Prior Living Arrangements/Services Living arrangements for the past 2 months: [place] Lives with:: Self Patient language and need for interpreter reviewed:: Yes Do you feel safe going back to the place where you live?: Yes      Need for Family Participation in Patient Care: Yes (Comment) Care giver support system in place?: Yes (comment) Current home services: DME    Activities of Daily Living Home Assistive Devices/Equipment: None ADL Screening (condition at time of admission) Patient's cognitive ability adequate to safely complete daily activities?: Yes Is the patient deaf or have difficulty hearing?:  No Does the patient have difficulty seeing, even when wearing glasses/contacts?: No Does the patient have difficulty concentrating, remembering, or making decisions?: No Patient able to express need for assistance with ADLs?: Yes Does the patient have difficulty dressing or bathing?: No Independently performs ADLs?: Yes (appropriate for developmental age) Does the patient have difficulty walking or climbing stairs?: No Weakness of Legs: None Weakness of Arms/Hands: None  Permission Sought/Granted                  Emotional Assessment       Orientation: : Oriented to Self, Oriented to Place, Oriented to  Time, Oriented to Situation   Psych Involvement: No (comment)  Admission diagnosis:  Thrombocytopenia ([place]) [D69.6] Acute respiratory failure with hypoxia (HCC) [J96.01] PNA (pneumonia) [J18.9] Moderate persistent reactive airway disease with wheezing with acute exacerbation [J45.41] COVID-19 virus infection [U07.1] Patient Active Problem List   Diagnosis Date Noted  . Pneumonia due to COVID-19 virus 11/15/2018  . Acute respiratory failure with hypoxia ([place]) 12/19/2017  . 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 Thrombocytopenia ([place]) 12/19/2017  . Pneumonitis 12/17/2017  . DM (diabetes mellitus) ([place]) 12/17/2017  . HTN (hypertension) 12/17/2017   PCP:  Patient, No Pcp Per Pharmacy:   [id number] [place] [id number]: [phone]: [phone]    Social Determinants of Health (SDOH) Interventions    Readmission Risk Interventions No flowsheet data found.

## 2018-11-22 NOTE — Progress Notes (Signed)
Pt d/c to home. Daughter came to pick her up. IV removed. All d/c instructions reviewed with pt. Pt also provided with paper prescriptions. VSS. No pain at d/c. All belongings removed from room and given to pt

## 2018-11-22 NOTE — Progress Notes (Addendum)
Patient desat to 78% with activity up to [facility]. Able to recover sats to low 90s on 4L nasal cannula once at rest. Removed external catheter to encourage more activity to [facility] w/ assistance.   0530: Patient independently transferred to commode, desat to 73% while on 3L. Assisted patient back to chair and patient able to recover to mid 80s on 4L. Patient moderately SOB while up this AM and strong productive cough present.

## 2018-12-03 ENCOUNTER — Encounter (INDEPENDENT_AMBULATORY_CARE_PROVIDER_SITE_OTHER): Payer: Self-pay | Admitting: Primary Care

## 2018-12-03 ENCOUNTER — Ambulatory Visit (INDEPENDENT_AMBULATORY_CARE_PROVIDER_SITE_OTHER): Payer: Medicare Other | Admitting: Licensed Clinical Social Worker

## 2018-12-03 ENCOUNTER — Ambulatory Visit (INDEPENDENT_AMBULATORY_CARE_PROVIDER_SITE_OTHER): Payer: Medicare Other | Admitting: Primary Care

## 2018-12-03 ENCOUNTER — Other Ambulatory Visit: Payer: Self-pay

## 2018-12-03 ENCOUNTER — Other Ambulatory Visit (INDEPENDENT_AMBULATORY_CARE_PROVIDER_SITE_OTHER): Payer: Self-pay | Admitting: Primary Care

## 2018-12-03 DIAGNOSIS — Z7689 Persons encountering health services in other specified circumstances: Secondary | ICD-10-CM

## 2018-12-03 DIAGNOSIS — J9601 Acute respiratory failure with hypoxia: Secondary | ICD-10-CM | POA: Diagnosis not present

## 2018-12-03 DIAGNOSIS — Z760 Encounter for issue of repeat prescription: Secondary | ICD-10-CM

## 2018-12-03 DIAGNOSIS — F419 Anxiety disorder, unspecified: Secondary | ICD-10-CM | POA: Diagnosis not present

## 2018-12-03 DIAGNOSIS — I10 Essential (primary) hypertension: Secondary | ICD-10-CM | POA: Diagnosis not present

## 2018-12-03 DIAGNOSIS — F4323 Adjustment disorder with mixed anxiety and depressed mood: Secondary | ICD-10-CM | POA: Diagnosis not present

## 2018-12-03 DIAGNOSIS — G4700 Insomnia, unspecified: Secondary | ICD-10-CM | POA: Diagnosis not present

## 2018-12-03 MED ORDER — METOPROLOL SUCCINATE ER 200 MG PO TB24: 200.0000 mg | ORAL_TABLET | Freq: Two times a day (BID) | ORAL | 3 refills | Status: DC

## 2018-12-03 MED ORDER — OXYBUTYNIN CHLORIDE ER 10 MG PO TB24: 10.0000 mg | ORAL_TABLET | Freq: Every day | ORAL | 3 refills | Status: DC | PRN

## 2018-12-03 MED ORDER — ALBUTEROL SULFATE (2.5 MG/3ML) 0.083% IN NEBU: 2.5000 mg | INHALATION_SOLUTION | Freq: Four times a day (QID) | RESPIRATORY_TRACT | 1 refills | Status: DC | PRN

## 2018-12-03 MED ORDER — POLYETHYLENE GLYCOL 3350 17 GM/SCOOP PO POWD: 17.0000 g | Freq: Every day | ORAL | 1 refills | Status: DC | PRN

## 2018-12-03 MED ORDER — OLMESARTAN MEDOXOMIL 40 MG PO TABS: 40.0000 mg | ORAL_TABLET | Freq: Every day | ORAL | 3 refills | Status: DC

## 2018-12-03 MED ORDER — FLUOXETINE HCL 20 MG PO TABS: 20.0000 mg | ORAL_TABLET | Freq: Every day | ORAL | 3 refills | Status: DC

## 2018-12-03 MED ORDER — METFORMIN HCL 1000 MG PO TABS: 1000.0000 mg | ORAL_TABLET | ORAL | 3 refills | Status: DC

## 2018-12-03 MED ORDER — MONTELUKAST SODIUM 10 MG PO TABS: 10.0000 mg | ORAL_TABLET | Freq: Every evening | ORAL | 3 refills | Status: DC | PRN

## 2018-12-03 MED ORDER — MELATONIN 1 MG PO TABS: 5.0000 mg | ORAL_TABLET | Freq: Every evening | ORAL | 3 refills | Status: DC | PRN

## 2018-12-03 MED ORDER — OMEPRAZOLE 20 MG PO CPDR: 20.0000 mg | DELAYED_RELEASE_CAPSULE | Freq: Every day | ORAL | 1 refills | Status: DC

## 2018-12-03 MED ORDER — ATORVASTATIN CALCIUM 20 MG PO TABS: 20.0000 mg | ORAL_TABLET | Freq: Every day | ORAL | 3 refills | Status: DC

## 2018-12-03 MED ORDER — INSULIN LISPRO PROT & LISPRO (75-25 MIX) 100 UNIT/ML KWIKPEN: 40.0000 [IU] | PEN_INJECTOR | SUBCUTANEOUS | 3 refills | Status: DC

## 2018-12-03 MED ORDER — ALBUTEROL SULFATE HFA 108 (90 BASE) MCG/ACT IN AERS: 2.0000 | INHALATION_SPRAY | Freq: Four times a day (QID) | RESPIRATORY_TRACT | 1 refills | Status: DC | PRN

## 2018-12-03 MED ORDER — DIPHENHYDRAMINE HCL 50 MG PO TABS: 25.0000 mg | ORAL_TABLET | Freq: Every evening | ORAL | 0 refills | Status: DC | PRN

## 2018-12-03 NOTE — Progress Notes (Signed)
Virtual Visit via Telephone Note  I connected with Kathy Irwin on 12/03/18 at 10:50 AM EDT by telephone and verified that I am speaking with the correct person using two identifiers.   I discussed the limitations, risks, security and privacy concerns of performing an evaluation and management service by telephone and the availability of in person appointments. I also discussed with the patient that there may be a patient responsible charge related to this service. The patient expressed understanding and agreed to proceed.   History of Present Illness: Kathy Irwin is being seen for a tele visit to establish care and hospital follow up. She went to the emergency room on 11/13/2018 for a 2-day history of mild to moderate persistent dry cough with intermittent mild wheezing and shortness of breath. She refused COVID testing. Patient returns to the emergency room on 11/15/2018 hypoxic down to 85% room air COVID test was positive and she was transferred to [facility] facility. Now she is experiencing anxiety, depression and insomnia. Discharged on oxygen.    Past Medical History:  Diagnosis Date  . Diabetes mellitus without complication ([place])   . 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 Hypertension    Observations/Objective: Review of Systems  Respiratory: Positive for cough, shortness of breath and wheezing.   Psychiatric/Behavioral: Positive for depression. The patient is nervous/anxious and has insomnia.     Assessment and Plan: Kathy Irwin was seen today for hospitalization follow-up.  Diagnoses and all orders for this visit:  Encounter to establish care She is recently moved here and does not have a primary care she has several co morbidities. That will need to be managed and followed.   Acute respiratory failure with hypoxia (HCC) Secondary to pneumonia and COVID- remains short of breath she is also morbidly obese. Continue on oxygen that she was discharge from hospital on.    Essential hypertension Counseled on blood pressure goal of less than 130/80, low-sodium, DASH diet, medication compliance, 150 minutes of moderate intensity exercise per week. Discussed medication compliance, adverse effects.  Insomnia, unspecified type SECONDARY to traumatic experience hospitalization for COVID discharge on oxygen has not slept in 2 weeks. Melatonin 1 MG TABS; Take 5 tablets (5 mg total) by mouth at bedtime as needed.  Adjustment disorder with mixed anxiety and depressed mood Depression is when you feel down, blue or sad for at least 2 weeks in a row. You may experience increaset increase in sleeping, eating or  loss of interest in doing things that once gave you pleasure. Feeling worthless, guilty, nervous and low self esteem, avoiding interaction with other people or increase agitation. FLUoxetine (PROZAC) 20 MG tablet; Take 1 tablet (20 mg total) by mouth daily.  Medication refill -     albuterol (PROAIR HFA) 108 (90 Base) MCG/ACT inhaler; Inhale 2 puffs into the lungs every 6 (six) hours as needed for wheezing or shortness of breath. -     albuterol (PROVENTIL) (2.5 MG/3ML) 0.083% nebulizer solution; Take 3 mLs (2.5 mg total) by nebulization every 6 (six) hours as needed for wheezing or shortness of breath. -     atorvastatin (LIPITOR) 20 MG tablet; Take 1 tablet (20 mg total) by mouth at bedtime. -     Insulin Lispro Prot & Lispro (HUMALOG MIX 75/25 KWIKPEN) (75-25) 100 UNIT/ML Kwikpen; Inject 40 Units into the skin See admin instructions. Inject 50 units into the skin before breakfast "and a second dose at bedtime 30 units -     metFORMIN (GLUCOPHAGE) 1000 MG tablet;  Take 1 tablet (1,000 mg total) by mouth See admin instructions. Take 1,000 mg by mouth in the morning with breakfast "and a second dose of 1,000 mg daily if BGL is still elevated" -     metoprolol (TOPROL-XL) 200 MG 24 hr tablet; Take 1 tablet (200 mg total) by mouth 2 (two) times daily. -     montelukast  (SINGULAIR) 10 MG tablet; Take 1 tablet (10 mg total) by mouth at bedtime as needed (for seasonal allergies). -     olmesartan (BENICAR) 40 MG tablet; Take 1 tablet (40 mg total) by mouth daily. -     omeprazole (PRILOSEC) 20 MG capsule; Take 1 capsule (20 mg total) by mouth daily. -     oxybutynin (DITROPAN-XL) 10 MG 24 hr tablet; Take 1 tablet (10 mg total) by mouth daily as needed (for symptoms of bladder instability). -     polyethylene glycol powder (MIRALAX) 17 GM/SCOOP powder; Take 17 g by mouth daily as needed for mild constipation.    Follow Up Instructions:    I discussed the assessment and treatment plan with the patient. The patient was provided an opportunity to ask questions and all were answered. The patient agreed with the plan and demonstrated an understanding of the instructions.   The patient was advised to call back or seek an in-person evaluation if the symptoms worsen or if the condition fails to improve as anticipated.  I provided 30 minutes of non-face-to-face time during this encounter.   Kerin Perna, NP

## 2018-12-03 NOTE — Progress Notes (Signed)
Pt complains of insomnia for the past  2 weeks

## 2018-12-13 NOTE — BH Specialist Note (Signed)
Integrated Behavioral Health Visit via Telemedicine (Telephone)  12/03/2018 Kathy Irwin [id number]   Session Start time: 9:55 AM  Session End time: 10:15 AM Total time: 20 minutes  Referring Provider: NP Oletta Lamas Type of Visit: Telephonic Patient location: Home [facility] Provider location: Office All persons participating in visit: LCSW and Patient  Confirmed patient's address: No  Confirmed patient's phone number: No  Any changes to demographics: No   Confirmed patient's insurance: No  Any changes to patient's insurance: No   Discussed confidentiality: Yes    The following statements were read to the patient and/or legal guardian that are established with the [facility] Provider.  "The purpose of this phone visit is to provide behavioral health care while limiting exposure to the coronavirus (COVID19).  There is a possibility of technology failure and discussed alternative modes of communication if that failure occurs."  "By engaging in this telephone visit, you consent to the provision of healthcare.  Additionally, you authorize for your insurance to be billed for the services provided during this telephone visit."   Patient and/or legal guardian consented to telephone visit: Yes   PRESENTING CONCERNS: Patient and/or family reports the following symptoms/concerns: Pt reports difficulty managing anxiety symptoms. She has difficulty sleeping, crying episodes, and panic attacks regarding health Duration of problem: 1 month; Severity of problem: moderate  STRENGTHS (Protective Factors/Coping Skills): Pt receives support from family (adult children) Pt is willing to participate in medication management  GOALS ADDRESSED: Patient will: 1.  Reduce symptoms of: anxiety  2.  Increase knowledge and/or ability of: coping skills and healthy habits  3.  Demonstrate ability to: Increase healthy adjustment to current life circumstances  INTERVENTIONS: Interventions utilized:   Mindfulness or Psychologist, educational, Supportive Counseling and Psychoeducation and/or Health Education Standardized Assessments completed: GAD-7 and PHQ 2  ASSESSMENT: Patient currently experiencing anxiety triggered by concerns for health. Pt reports that she was recently hospitalized due to having COVID19 and has been having difficulty managing anxiety since discharge home. She receives support from adult children. Pt complains of the following symptoms: difficulty sleeping, crying episodes, and panic attacks regarding health.   Patient may benefit from psychotherapy. She has agreed to participate in medication management to assist with sleep hygiene and decrease/management of symptoms. LCSW provided support and validation. Therapeutic strategies to promote relaxation were discussed.   PLAN: 1. Follow up with behavioral health clinician on : Schedule follow up appointment with LCSW 2. Behavioral recommendations: LCSW recommends pt utilize healthy coping skills discussed (journaling, healing scripture, and deep breathing) and comply with medication management 3. Referral(s): [place] (In Clinic)  Rebekah Chesterfield, [place] 12/13/2018 9:17 AM

## 2018-12-17 ENCOUNTER — Ambulatory Visit
Admission: RE | Admit: 2018-12-17 | Discharge: 2018-12-17 | Disposition: A | Discharge status: Home / Self Care | Payer: Medicare Other | Source: Ambulatory Visit | Attending: Family Medicine | Admitting: Family Medicine

## 2018-12-17 ENCOUNTER — Other Ambulatory Visit: Payer: Self-pay | Admitting: Family Medicine

## 2018-12-17 DIAGNOSIS — J189 Pneumonia, unspecified organism: Secondary | ICD-10-CM

## 2018-12-17 DIAGNOSIS — J9601 Acute respiratory failure with hypoxia: Secondary | ICD-10-CM

## 2018-12-17 HISTORY — DX: Acute respiratory failure with hypoxia: J96.01

## 2018-12-23 ENCOUNTER — Other Ambulatory Visit: Payer: Self-pay

## 2018-12-23 ENCOUNTER — Emergency Department (HOSPITAL_COMMUNITY): Payer: Medicare Other

## 2018-12-23 ENCOUNTER — Inpatient Hospital Stay (HOSPITAL_COMMUNITY)
Admission: EM | Admit: 2018-12-23 | Discharge: 2018-12-26 | DRG: 196 | Disposition: A | Discharge status: Home Health | Payer: Medicare Other | Attending: Internal Medicine | Admitting: Internal Medicine

## 2018-12-23 DIAGNOSIS — I700 Atherosclerosis of aorta: Secondary | ICD-10-CM | POA: Diagnosis present

## 2018-12-23 DIAGNOSIS — J9621 Acute and chronic respiratory failure with hypoxia: Secondary | ICD-10-CM | POA: Diagnosis present

## 2018-12-23 DIAGNOSIS — I5033 Acute on chronic diastolic (congestive) heart failure: Secondary | ICD-10-CM | POA: Diagnosis present

## 2018-12-23 DIAGNOSIS — K760 Fatty (change of) liver, not elsewhere classified: Secondary | ICD-10-CM | POA: Diagnosis present

## 2018-12-23 DIAGNOSIS — Z6841 Body Mass Index (BMI) 40.0 and over, adult: Secondary | ICD-10-CM | POA: Diagnosis not present

## 2018-12-23 DIAGNOSIS — E1165 Type 2 diabetes mellitus with hyperglycemia: Secondary | ICD-10-CM | POA: Diagnosis present

## 2018-12-23 DIAGNOSIS — R0600 Dyspnea, unspecified: Secondary | ICD-10-CM

## 2018-12-23 DIAGNOSIS — J986 Disorders of diaphragm: Secondary | ICD-10-CM

## 2018-12-23 DIAGNOSIS — E785 Hyperlipidemia, unspecified: Secondary | ICD-10-CM | POA: Diagnosis present

## 2018-12-23 DIAGNOSIS — D696 Thrombocytopenia, unspecified: Secondary | ICD-10-CM | POA: Diagnosis present

## 2018-12-23 DIAGNOSIS — J849 Interstitial pulmonary disease, unspecified: Secondary | ICD-10-CM | POA: Diagnosis present

## 2018-12-23 DIAGNOSIS — Z91041 Radiographic dye allergy status: Secondary | ICD-10-CM | POA: Diagnosis not present

## 2018-12-23 DIAGNOSIS — Z20828 Contact with and (suspected) exposure to other viral communicable diseases: Secondary | ICD-10-CM | POA: Diagnosis present

## 2018-12-23 DIAGNOSIS — Z7984 Long term (current) use of oral hypoglycemic drugs: Secondary | ICD-10-CM | POA: Diagnosis not present

## 2018-12-23 DIAGNOSIS — Z79899 Other long term (current) drug therapy: Secondary | ICD-10-CM | POA: Diagnosis not present

## 2018-12-23 DIAGNOSIS — Z794 Long term (current) use of insulin: Secondary | ICD-10-CM

## 2018-12-23 DIAGNOSIS — I110 Hypertensive heart disease with heart failure: Secondary | ICD-10-CM | POA: Diagnosis present

## 2018-12-23 DIAGNOSIS — Z9981 Dependence on supplemental oxygen: Secondary | ICD-10-CM | POA: Diagnosis not present

## 2018-12-23 DIAGNOSIS — E118 Type 2 diabetes mellitus with unspecified complications: Secondary | ICD-10-CM

## 2018-12-23 DIAGNOSIS — Z888 Allergy status to other drugs, medicaments and biological substances status: Secondary | ICD-10-CM

## 2018-12-23 DIAGNOSIS — Z8701 Personal history of pneumonia (recurrent): Secondary | ICD-10-CM

## 2018-12-23 DIAGNOSIS — Z833 Family history of diabetes mellitus: Secondary | ICD-10-CM

## 2018-12-23 DIAGNOSIS — Z9119 Patient's noncompliance with other medical treatment and regimen: Secondary | ICD-10-CM

## 2018-12-23 DIAGNOSIS — E119 Type 2 diabetes mellitus without complications: Secondary | ICD-10-CM

## 2018-12-23 DIAGNOSIS — J9601 Acute respiratory failure with hypoxia: Secondary | ICD-10-CM | POA: Diagnosis present

## 2018-12-23 DIAGNOSIS — I5031 Acute diastolic (congestive) heart failure: Secondary | ICD-10-CM

## 2018-12-23 DIAGNOSIS — J302 Other seasonal allergic rhinitis: Secondary | ICD-10-CM | POA: Diagnosis present

## 2018-12-23 DIAGNOSIS — J9600 Acute respiratory failure, unspecified whether with hypoxia or hypercapnia: Secondary | ICD-10-CM | POA: Diagnosis present

## 2018-12-23 DIAGNOSIS — D7589 Other specified diseases of blood and blood-forming organs: Secondary | ICD-10-CM | POA: Diagnosis present

## 2018-12-23 DIAGNOSIS — E669 Obesity, unspecified: Secondary | ICD-10-CM

## 2018-12-23 DIAGNOSIS — D72819 Decreased white blood cell count, unspecified: Secondary | ICD-10-CM | POA: Diagnosis present

## 2018-12-23 DIAGNOSIS — Z91048 Other nonmedicinal substance allergy status: Secondary | ICD-10-CM

## 2018-12-23 DIAGNOSIS — Z8249 Family history of ischemic heart disease and other diseases of the circulatory system: Secondary | ICD-10-CM

## 2018-12-23 DIAGNOSIS — J984 Other disorders of lung: Secondary | ICD-10-CM | POA: Diagnosis present

## 2018-12-23 DIAGNOSIS — B948 Sequelae of other specified infectious and parasitic diseases: Secondary | ICD-10-CM

## 2018-12-23 DIAGNOSIS — J189 Pneumonia, unspecified organism: Secondary | ICD-10-CM | POA: Diagnosis not present

## 2018-12-23 DIAGNOSIS — R932 Abnormal findings on diagnostic imaging of liver and biliary tract: Secondary | ICD-10-CM | POA: Diagnosis not present

## 2018-12-23 LAB — BASIC METABOLIC PANEL
Anion gap: 9 (ref 5–15)
BUN: 7 mg/dL — ABNORMAL LOW (ref 8–23)
CO2: 29 mmol/L (ref 22–32)
Calcium: 9.9 mg/dL (ref 8.9–10.3)
Chloride: 102 mmol/L (ref 98–111)
Creatinine, Ser: 0.64 mg/dL (ref 0.44–1.00)
GFR calc Af Amer: >60 mL/min (ref >60)
GFR calc non Af Amer: >60 mL/min (ref >60)
Glucose, Bld: 175 mg/dL — ABNORMAL HIGH (ref 70–99)
Potassium: 3.9 mmol/L (ref 3.5–5.1)
Sodium: 140 mmol/L (ref 135–145)

## 2018-12-23 LAB — CBC
HCT: 37.7 % (ref 36.0–46.0)
Hemoglobin: 12 g/dL (ref 12.0–15.0)
MCH: 31.8 pg (ref 26.0–34.0)
MCHC: 31.8 g/dL (ref 30.0–36.0)
MCV: 100 fL (ref 80.0–100.0)
Platelets: 87 10*3/uL — ABNORMAL LOW (ref 150–400)
RBC: 3.77 MIL/uL — ABNORMAL LOW (ref 3.87–5.11)
RDW: 21 % — ABNORMAL HIGH (ref 11.5–15.5)
WBC: 5 10*3/uL (ref 4.0–10.5)
nRBC: 0.8 % — ABNORMAL HIGH (ref 0.0–0.2)

## 2018-12-23 LAB — HEPATIC FUNCTION PANEL
ALT: 13 U/L (ref 0–44)
AST: 17 U/L (ref 15–41)
Albumin: 3.8 g/dL (ref 3.5–5.0)
Alkaline Phosphatase: 55 U/L (ref 38–126)
Bilirubin, Direct: 0.3 mg/dL — ABNORMAL HIGH (ref 0.0–0.2)
Indirect Bilirubin: 1.5 mg/dL — ABNORMAL HIGH (ref 0.3–0.9)
Total Bilirubin: 1.8 mg/dL — ABNORMAL HIGH (ref 0.3–1.2)
Total Protein: 7.1 g/dL (ref 6.5–8.1)

## 2018-12-23 LAB — POCT I-STAT EG7
Acid-Base Excess: 5 mmol/L — ABNORMAL HIGH (ref 0.0–2.0)
Bicarbonate: 31.6 mmol/L — ABNORMAL HIGH (ref 20.0–28.0)
Calcium, Ion: 1.29 mmol/L (ref 1.15–1.40)
HCT: 39 % (ref 36.0–46.0)
Hemoglobin: 13.3 g/dL (ref 12.0–15.0)
O2 Saturation: 93 %
Potassium: 4.1 mmol/L (ref 3.5–5.1)
Sodium: 141 mmol/L (ref 135–145)
TCO2: 33 mmol/L — ABNORMAL HIGH (ref 22–32)
pCO2, Ven: 57.1 mmHg (ref 44.0–60.0)
pH, Ven: 7.352 (ref 7.250–7.430)
pO2, Ven: 73 mmHg — ABNORMAL HIGH (ref 32.0–45.0)

## 2018-12-23 LAB — FERRITIN: Ferritin: 306 ng/mL (ref 11–307)

## 2018-12-23 LAB — TROPONIN I (HIGH SENSITIVITY)
Troponin I (High Sensitivity): 2 ng/L (ref <18)
Troponin I (High Sensitivity): <2 ng/L (ref <18)

## 2018-12-23 LAB — D-DIMER, QUANTITATIVE: D-Dimer, Quant: 0.46 ug/mL-FEU (ref 0.00–0.50)

## 2018-12-23 LAB — BRAIN NATRIURETIC PEPTIDE: B Natriuretic Peptide: 21 pg/mL (ref 0.0–100.0)

## 2018-12-23 LAB — C-REACTIVE PROTEIN: CRP: 1.4 mg/dL — ABNORMAL HIGH (ref <1.0)

## 2018-12-23 LAB — PROCALCITONIN: Procalcitonin: <0.1 ng/mL

## 2018-12-23 LAB — TSH: TSH: 4.013 u[IU]/mL (ref 0.350–4.500)

## 2018-12-23 LAB — FIBRINOGEN: Fibrinogen: 424 mg/dL (ref 210–475)

## 2018-12-23 LAB — SARS CORONAVIRUS 2 BY RT PCR (HOSPITAL ORDER, PERFORMED IN ~~LOC~~ HOSPITAL LAB): SARS Coronavirus 2: NEGATIVE

## 2018-12-23 LAB — LACTATE DEHYDROGENASE: LDH: 351 U/L — ABNORMAL HIGH (ref 98–192)

## 2018-12-23 MED ORDER — OXYBUTYNIN CHLORIDE ER 10 MG PO TB24
10.0000 mg | ORAL_TABLET | Freq: Every day | ORAL | Status: DC | PRN
  Filled 2018-12-23: qty 1

## 2018-12-23 MED ORDER — PANTOPRAZOLE SODIUM 40 MG PO TBEC
40.0000 mg | DELAYED_RELEASE_TABLET | Freq: Every day | ORAL | Status: DC
  Administered 2018-12-24 – 2018-12-26 (×3): 40 mg via ORAL
  Filled 2018-12-23 (×3): qty 1

## 2018-12-23 MED ORDER — ACETAMINOPHEN 650 MG RE SUPP: 650.0000 mg | Freq: Four times a day (QID) | RECTAL | Status: DC | PRN

## 2018-12-23 MED ORDER — ATORVASTATIN CALCIUM 10 MG PO TABS
20.0000 mg | ORAL_TABLET | Freq: Every day | ORAL | Status: DC
  Administered 2018-12-24 – 2018-12-25 (×3): 20 mg via ORAL
  Filled 2018-12-23 (×3): qty 2

## 2018-12-23 MED ORDER — SODIUM CHLORIDE 0.9% FLUSH: 3.0000 mL | Freq: Once | INTRAVENOUS | Status: DC

## 2018-12-23 MED ORDER — ALBUTEROL SULFATE HFA 108 (90 BASE) MCG/ACT IN AERS
2.0000 | INHALATION_SPRAY | Freq: Four times a day (QID) | RESPIRATORY_TRACT | Status: DC | PRN
  Filled 2018-12-23: qty 6.7

## 2018-12-23 MED ORDER — SODIUM CHLORIDE 0.9 % IV SOLN: 250.0000 mL | INTRAVENOUS | Status: DC | PRN

## 2018-12-23 MED ORDER — ONDANSETRON HCL 4 MG PO TABS: 4.0000 mg | ORAL_TABLET | Freq: Four times a day (QID) | ORAL | Status: DC | PRN

## 2018-12-23 MED ORDER — GABAPENTIN 300 MG PO CAPS
300.0000 mg | ORAL_CAPSULE | Freq: Two times a day (BID) | ORAL | Status: DC
  Administered 2018-12-24 – 2018-12-26 (×6): 300 mg via ORAL
  Filled 2018-12-23 (×6): qty 1

## 2018-12-23 MED ORDER — MONTELUKAST SODIUM 10 MG PO TABS: 10.0000 mg | ORAL_TABLET | Freq: Every evening | ORAL | Status: DC | PRN

## 2018-12-23 MED ORDER — HYDROCODONE-ACETAMINOPHEN 5-325 MG PO TABS
1.0000 | ORAL_TABLET | ORAL | Status: DC | PRN
  Filled 2018-12-23: qty 2

## 2018-12-23 MED ORDER — LORATADINE 10 MG PO TABS: 10.0000 mg | ORAL_TABLET | Freq: Every day | ORAL | Status: DC | PRN

## 2018-12-23 MED ORDER — ACETAMINOPHEN 325 MG PO TABS: 650.0000 mg | ORAL_TABLET | Freq: Four times a day (QID) | ORAL | Status: DC | PRN

## 2018-12-23 MED ORDER — INSULIN GLARGINE 100 UNIT/ML ~~LOC~~ SOLN
30.0000 [IU] | Freq: Every day | SUBCUTANEOUS | Status: DC
  Administered 2018-12-24 (×2): 30 [IU] via SUBCUTANEOUS
  Filled 2018-12-23 (×3): qty 0.3

## 2018-12-23 MED ORDER — SODIUM CHLORIDE 0.9% FLUSH
3.0000 mL | Freq: Two times a day (BID) | INTRAVENOUS | Status: DC
  Administered 2018-12-24 – 2018-12-26 (×5): 3 mL via INTRAVENOUS

## 2018-12-23 MED ORDER — ONDANSETRON HCL 4 MG/2ML IJ SOLN: 4.0000 mg | Freq: Four times a day (QID) | INTRAMUSCULAR | Status: DC | PRN

## 2018-12-23 MED ORDER — FUROSEMIDE 10 MG/ML IJ SOLN
40.0000 mg | Freq: Once | INTRAMUSCULAR | Status: AC
  Administered 2018-12-23: 40 mg via INTRAVENOUS
  Filled 2018-12-23: qty 4

## 2018-12-23 MED ORDER — SODIUM CHLORIDE 0.9% FLUSH: 3.0000 mL | INTRAVENOUS | Status: DC | PRN

## 2018-12-23 MED ORDER — INSULIN ASPART 100 UNIT/ML ~~LOC~~ SOLN
0.0000 [IU] | Freq: Every day | SUBCUTANEOUS | Status: DC
  Administered 2018-12-24: 4 [IU] via SUBCUTANEOUS
  Administered 2018-12-24: 2 [IU] via SUBCUTANEOUS

## 2018-12-23 MED ORDER — FUROSEMIDE 10 MG/ML IJ SOLN: 40.0000 mg | Freq: Every day | INTRAMUSCULAR | Status: DC

## 2018-12-23 MED ORDER — IOHEXOL 350 MG/ML SOLN
100.0000 mL | Freq: Once | INTRAVENOUS | Status: AC | PRN
  Administered 2018-12-23: 100 mL via INTRAVENOUS

## 2018-12-23 MED ORDER — METOPROLOL SUCCINATE ER 100 MG PO TB24
200.0000 mg | ORAL_TABLET | Freq: Two times a day (BID) | ORAL | Status: DC
  Administered 2018-12-24 – 2018-12-26 (×6): 200 mg via ORAL
  Filled 2018-12-23 (×6): qty 2

## 2018-12-23 MED ORDER — FLUOXETINE HCL 20 MG PO CAPS
20.0000 mg | ORAL_CAPSULE | Freq: Every day | ORAL | Status: DC
  Administered 2018-12-24 – 2018-12-26 (×3): 20 mg via ORAL
  Filled 2018-12-23 (×4): qty 1

## 2018-12-23 MED ORDER — INSULIN ASPART 100 UNIT/ML ~~LOC~~ SOLN
0.0000 [IU] | Freq: Three times a day (TID) | SUBCUTANEOUS | Status: DC
  Administered 2018-12-24: 3 [IU] via SUBCUTANEOUS
  Administered 2018-12-24: 1 [IU] via SUBCUTANEOUS
  Administered 2018-12-24: 2 [IU] via SUBCUTANEOUS
  Administered 2018-12-25: 3 [IU] via SUBCUTANEOUS
  Administered 2018-12-25: 5 [IU] via SUBCUTANEOUS

## 2018-12-23 NOTE — ED Notes (Signed)
Patient transported to CT 

## 2018-12-23 NOTE — ED Provider Notes (Signed)
[place] EMERGENCY DEPARTMENT Provider Note   CSN: [id number] Arrival date & time: 12/23/18  1425     History   Chief Complaint Chief Complaint  Patient presents with  . Shortness of Breath  . Dizziness    HPI Kathy Irwin is a 68 y.o. female.     Patient is a 68 year old female with past medical history of hypertension, diabetes, and recent admission to [facility] for COVID-19 infection with hypoxia.  She was discharged approximately 4 weeks ago.  She was discharged on oxygen which she has been using intermittently.  Patient states that over the past week, she has described worsening dyspnea with exertion and pain and swelling to her legs.  She also reports feeling dizzy when she ambulates.  She denies any fevers or chills.  She denies any productive cough.  The history is provided by the patient.  Shortness of Breath Severity:  Moderate Onset quality:  Gradual Duration:  1 week Timing:  Constant Progression:  Worsening Chronicity:  New Context: activity and URI   Relieved by:  Nothing Worsened by:  Exertion and movement Ineffective treatments:  None tried Associated symptoms: no fever and no sputum production     Past Medical History:  Diagnosis Date  . Diabetes mellitus without complication ([place])   . 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 Hypertension     Patient Active Problem List   Diagnosis Date Noted  . Pneumonia due to COVID-19 virus 11/15/2018  . Acute respiratory failure with hypoxia ([place]) 12/19/2017  . 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 Thrombocytopenia ([place]) 12/19/2017  . Pneumonitis 12/17/2017  . DM (diabetes mellitus) ([place]) 12/17/2017  . HTN (hypertension) 12/17/2017    No past surgical history on file.   OB History   No obstetric history on file.      Home Medications    Prior to Admission medications   Medication Sig Start Date End Date Taking? Authorizing Provider  acetaminophen  (TYLENOL) 500 MG tablet Take 1,000 mg by mouth 2 (two) times daily as needed (for pain).    [provider]  albuterol (PROAIR HFA) 108 (90 Base) MCG/ACT inhaler Inhale 2 puffs into the lungs every 6 (six) hours as needed for wheezing or shortness of breath. 12/03/18   Kerin Perna, NP  albuterol (PROVENTIL) (2.5 MG/3ML) 0.083% nebulizer solution Take 3 mLs (2.5 mg total) by nebulization every 6 (six) hours as needed for wheezing or shortness of breath. 12/03/18   Kerin Perna, NP  atorvastatin (LIPITOR) 20 MG tablet Take 1 tablet (20 mg total) by mouth at bedtime. 12/03/18   Kerin Perna, NP  Azelastine-Fluticasone (DYMISTA NA) Place 1-2 sprays into both nostrils daily as needed (for allergies).     [provider]  blood glucose meter kit and supplies KIT Dispense based on patient and insurance preference. Use up to four times daily as directed. (FOR ICD-9 250.00, 250.01). For QAC - HS accuchecks. 11/22/18   Thurnell Lose, MD  diphenhydrAMINE (BENADRYL) 50 MG tablet Take 0.5 tablets (25 mg total) by mouth at bedtime as needed for itching. 12/03/18   Kerin Perna, NP  FLUoxetine (PROZAC) 20 MG tablet Take 1 tablet (20 mg total) by mouth daily. 12/03/18   Kerin Perna, NP  gabapentin (NEURONTIN) 300 MG capsule Take 300 mg by mouth 2 (two) times daily. 07/29/18   [provider]  glucose blood (FREESTYLE LITE) test strip For glucose testing every before meals at bedtime. Diagnosis  E 11.65  Can substitute to any accepted brand 11/22/18   Thurnell Lose, MD  insulin aspart (NOVOLOG) 100 UNIT/ML injection Before each meal 3 times a day , 140-199 - 2 units, 200-250 - 4 units, 251-299 - 6 units,  300-349 - 8 units,  350 or above 10 units. Insulin PEN if approved, provide syringes and needles if needed. Can switch to any other cheaper alternative. Patient not taking: Reported on 12/03/2018 11/22/18   Thurnell Lose, MD  Insulin Lispro Prot & Lispro  (HUMALOG MIX 75/25 KWIKPEN) (75-25) 100 UNIT/ML Kwikpen Inject 40 Units into the skin See admin instructions. Inject 50 units into the skin before breakfast "and a second dose at bedtime 30 units 12/03/18   Kerin Perna, NP  loratadine (CLARITIN) 10 MG tablet Take 10 mg by mouth daily as needed for allergies or rhinitis.     [provider]  Melatonin 1 MG TABS Take 5 tablets (5 mg total) by mouth at bedtime as needed. 12/03/18   Kerin Perna, NP  metFORMIN (GLUCOPHAGE) 1000 MG tablet Take 1 tablet (1,000 mg total) by mouth See admin instructions. Take 1,000 mg by mouth in the morning with breakfast "and a second dose of 1,000 mg daily if BGL is still elevated" 12/03/18   Kerin Perna, NP  metoprolol (TOPROL-XL) 200 MG 24 hr tablet Take 1 tablet (200 mg total) by mouth 2 (two) times daily. 12/03/18   Kerin Perna, NP  montelukast (SINGULAIR) 10 MG tablet Take 1 tablet (10 mg total) by mouth at bedtime as needed (for seasonal allergies). 12/03/18   Kerin Perna, NP  olmesartan (BENICAR) 40 MG tablet Take 1 tablet (40 mg total) by mouth daily. 12/03/18   Kerin Perna, NP  omeprazole (PRILOSEC) 20 MG capsule Take 1 capsule (20 mg total) by mouth daily. 12/03/18   Kerin Perna, NP  oxybutynin (DITROPAN-XL) 10 MG 24 hr tablet Take 1 tablet (10 mg total) by mouth daily as needed (for symptoms of bladder instability). 12/03/18   Kerin Perna, NP  polyethylene glycol powder (MIRALAX) 17 GM/SCOOP powder Take 17 g by mouth daily as needed for mild constipation. 12/03/18   Kerin Perna, NP  Vitamin D, Ergocalciferol, (DRISDOL) 50000 units CAPS capsule Take 50,000 Units by mouth every Thursday.    [provider]    Family History Family History  Problem Relation Age of Onset  . Diabetes Other   . Hypertension Other     Social History Social History   Tobacco Use  . Smoking status: Never Smoker  . Smokeless tobacco: Never Used   Substance Use Topics  . Alcohol use: Never    Frequency: Never  . Drug use: Never     Allergies   Other, Iodine, and Merbromin   Review of Systems Review of Systems  Constitutional: Negative for fever.  Respiratory: Negative for sputum production.   All other systems reviewed and are negative.    Physical Exam Updated Vital Signs BP (!) 156/79 (BP Location: Right Arm)   Pulse (!) 102   Temp 98.9 F (37.2 C) (Oral)   Resp 14   SpO2 92%   Physical Exam Vitals signs and nursing note reviewed.  Constitutional:      General: She is not in acute distress.    Appearance: She is well-developed. She is not diaphoretic.  HENT:     Head: Normocephalic and atraumatic.  Neck:     Musculoskeletal: Normal range  of motion and neck supple.  Cardiovascular:     Rate and Rhythm: Normal rate and regular rhythm.     Heart sounds: No murmur. No friction rub. No gallop.   Pulmonary:     Effort: Pulmonary effort is normal. No respiratory distress.     Breath sounds: Normal breath sounds. No wheezing.  Abdominal:     General: Bowel sounds are normal. There is no distension.     Palpations: Abdomen is soft.     Tenderness: There is no abdominal tenderness.  Musculoskeletal: Normal range of motion.     Right lower leg: She exhibits tenderness. Edema present.     Left lower leg: She exhibits tenderness. Edema present.     Comments: There is 2+ pitting edema of both lower extremities and tenderness to both calves.  Skin:    General: Skin is warm and dry.  Neurological:     Mental Status: She is alert and oriented to person, place, and time.      ED Treatments / Results  Labs (all labs ordered are listed, but only abnormal results are displayed) Labs Reviewed  BASIC METABOLIC PANEL - Abnormal; Notable for the following components:      Result Value   Glucose, Bld 175 (*)    BUN 7 (*)    All other components within normal limits  CBC - Abnormal; Notable for the following  components:   RBC 3.77 (*)    RDW 21.0 (*)    Platelets 87 (*)    nRBC 0.8 (*)    All other components within normal limits  BRAIN NATRIURETIC PEPTIDE  D-DIMER, QUANTITATIVE (NOT AT [facility])  TROPONIN I (HIGH SENSITIVITY)    EKG EKG Interpretation  Date/Time:  Monday December 23 2018 21:24:28 EDT Ventricular Rate:  102 PR Interval:    QRS Duration: 86 QT Interval:  330 QTC Calculation: 430 R Axis:   -8 Text Interpretation:  Sinus tachycardia Low voltage, precordial leads Abnormal R-wave progression, early transition Left ventricular hypertrophy Borderline T abnormalities, anterior leads Confirmed by Addison Lank [phone]) on 12/24/2018 11:39:39 AM   Radiology Dg Chest 2 View  Result Date: 12/23/2018 CLINICAL DATA:  Shortness of breath EXAM: CHEST - 2 VIEW COMPARISON:  12/17/2018 FINDINGS: The heart size and mediastinal contours are stable. Minimal bibasilar opacities, improving compared to prior. No new focal airspace opacity. No pleural effusion or pneumothorax. The visualized skeletal structures are unremarkable. IMPRESSION: Improving bibasilar airspace opacities. Electronically Signed   By: Davina Poke M.D.   On: 12/23/2018 15:25    Procedures Procedures (including critical care time)  Medications Ordered in ED Medications  sodium chloride flush (NS) 0.9 % injection 3 mL (3 mLs Intravenous Not Given 12/23/18 1608)     Initial Impression / Assessment and Plan / ED Course  I have reviewed the triage vital signs and the nursing notes.  Pertinent labs & imaging results that were available during my care of the patient were reviewed by me and considered in my medical decision making (see chart for details).  Patient presenting with complaints of difficulty breathing and weakness.  Patient was diagnosed with COVID-19 last month and was previously admitted at the [facility].  Since returning home she has had persistent shortness of breath that is worsened over the  past week.  She has been on home oxygen, but still has difficulty ambulating more than a few steps without becoming short of breath and having to sit.  Patient's work-up today does show  lower extremity edema and possibly CHF on her chest CT.  There was no PE identified.  Whether the patient's dyspnea was caused by CHF or persistent lung inflammation status post COVID-19 infection I am uncertain.  Either way, the patient does not feel as though she can go home safely as she lives alone.  Dr. Roel Cluck from the hospitalist service has been consulted and will admit.  Final Clinical Impressions(s) / ED Diagnoses   Final diagnoses:  None    ED Discharge Orders    None       Veryl Speak, MD 12/24/18 1627

## 2018-12-23 NOTE — ED Triage Notes (Addendum)
Pt sts recently d/c from hospital 1 month ago with home O2 but she has still been feeling shob. Pt sts she wears the O2 as much as she can but "doesn't fool with it 24/7." Pt sts when she gets up and walks her O2 sats drop, and she feels shob, even while wearing O2 and has almost fallen multiple times d/t feeling dizzy. Endorses improving cough over the last month with clear mucous. Bilateral lower extremity swelling is increased from her normal. Pt wears 3L O2 at home.

## 2018-12-23 NOTE — H&P (Addendum)
Kathy Irwin LKJ:[id number] DOB: [birth year] DOA: 12/23/2018     PCP: Kerin Perna, NP   Outpatient Specialists:      Patient arrived to ER on 12/23/18 at 1425  Patient coming from: home Lives alone     Chief Complaint:   Chief Complaint  Patient presents with   Shortness of Breath   Dizziness    HPI: Kathy Irwin is a 68 y.o. female with medical history significant of recent COVID infection, DM 2, HTN, morbidly obese, seasonal allergies    Presented with persistent shortness of breath she continues to have shortness of breath on exertion although she tries to wear her oxygen she sometimes does not and when she walks her oxygen saturation drops and she feels very short of breath even while wearing oxygen she gets lightheaded with exertion and sometimes feels very dizzy.  Her cough has been getting somewhat better over past 1 month She continues to be on 3 L of oxygen Now developed some lower extremity swelling as well She no longer have any fevers or chills just occasional cough She never had fever Last diarrhea was week Never had loss of taste smell She is supposed to see Pulmonology but have not had an appointment yet She denies eating salty food but she does feel like she is accumulating fluid  Per family she has remote hx of lung disease of unclear etiology but no prior need for Oxygen  Per records Has  Underlining lung disease will need further work up  Initially tested positive for COVID and was admitted on 31 st of July to [facility] She required oxygen and had to be discharged home on oxygen Charge she has had trouble sleeping Was diagnosed with adjustment disorder and started on Prozac  Infectious risk factors:  Reports  shortness of breath, dry cough,   In  ER RAPID COVID TEST  in house testing  Pending  Lab Results  Component Value Date   [place] (A) 11/15/2018    Regarding pertinent Chronic problems:       HTN on Toprol,  Benicar   CHF diastolic  - last echo 79/48/0165  LV EF: 60% -   53% (grade 1 diastolic dysfunction).   DM 2 -  Lab Results  Component Value Date   HGBA1C 7.6 (H) 11/16/2018   on  PO meds and NPH 75/25 BID      Morbid obesity-   BMI Readings from Last 1 Encounters:  11/15/18 44.63 kg/m        While in ER: Chest x-ray showing improved bibasilar airspace opacities CT angiogram showed no PE but persistent multilobar infiltrates The following Work up has been ordered so far:  Orders Placed This Encounter  Procedures   DG Chest 2 View   CT Angio Chest PE W and/or Wo Contrast   Basic metabolic panel   CBC   Brain natriuretic peptide   D-dimer, quantitative (not at [facility])   Cardiac monitoring   Saline Lock IV, Maintain IV access   Consult to hospitalist  ALL PATIENTS BEING ADMITTED/HAVING PROCEDURES NEED COVID-19 SCREENING   Pulse oximetry, continuous   ED EKG   EKG 12-Lead   Following Medications were ordered in ER: Medications  sodium chloride flush (NS) 0.9 % injection 3 mL (3 mLs Intravenous Not Given 12/23/18 1608)  iohexol (OMNIPAQUE) 350 MG/ML injection 100 mL (100 mLs Intravenous Contrast Given 12/23/18 1841)        Consult Orders  (From admission, onward)  Start     Ordered   12/23/18 2026  Consult to hospitalist  ALL PATIENTS BEING ADMITTED/HAVING PROCEDURES NEED COVID-19 SCREENING Pg [facility]  Once    Comments: ALL PATIENTS BEING ADMITTED/HAVING PROCEDURES NEED COVID-19 SCREENING  Provider:  (Not yet assigned)  Question Answer Comment  Place call to: [facility]   Reason for Consult Admit      12/23/18 2025            Significant initial  Findings: Abnormal Labs Reviewed  BASIC METABOLIC PANEL - Abnormal; Notable for the following components:      Result Value   Glucose, Bld 175 (*)    BUN 7 (*)    All other components within normal limits  CBC - Abnormal; Notable for the following components:   RBC 3.77 (*)    RDW  21.0 (*)    Platelets 87 (*)    nRBC 0.8 (*)    All other components within normal limits  POCT I-STAT EG7 - Abnormal; Notable for the following components:   pO2, Ven 73.0 (*)    Bicarbonate 31.6 (*)    TCO2 33 (*)    Acid-Base Excess 5.0 (*)    All other components within normal limits    Otherwise labs showing:    Recent Labs  Lab 12/23/18 1446 12/23/18 2218  NA 140 141  K 3.9 4.1  CO2 29  --   GLUCOSE 175*  --   BUN 7*  --   CREATININE 0.64  --   CALCIUM 9.9  --     Cr   Stable,  Lab Results  Component Value Date   CREATININE 0.64 12/23/2018   CREATININE 0.65 11/20/2018   CREATININE 0.62 11/19/2018    No results for input(s): AST, ALT, ALKPHOS, BILITOT, PROT, ALBUMIN in the last 168 hours. Lab Results  Component Value Date   CALCIUM 9.9 12/23/2018      WBC       Component Value Date/Time   WBC 5.0 12/23/2018 1446   ANC No results found for: NEUTROABS ALC No components found for: LYMPHAB    Plt: Lab Results  Component Value Date   PLT 87 (L) 12/23/2018     Procalcitonin   Ordered   COVID-19 Labs  Recent Labs    12/23/18 1629  DDIMER 0.46    Lab Results  Component Value Date   SARSCOV2NAA POSITIVE (A) 11/15/2018      Venous  Blood Gas result:  pH 7.352  pCO2 57 ;   ABG    Component Value Date/Time   HCO3 25.5 11/16/2018 0048   TCO2 27 11/16/2018 0048   O2SAT 88.0 11/16/2018 0048      HG/HCT  stable,       Component Value Date/Time   HGB 12.0 12/23/2018 1446   HCT 37.7 12/23/2018 1446      Troponin  <2     ECG: Ordered Personally reviewed by me showing: HR : 106 Rhythm:  Sinus tachycardia    no evidence of ischemic changes QTC 448   BNP (last 3 results) Recent Labs    11/15/18 2108 12/23/18 1446  BNP 74.4 21.0    ProBNP (last 3 results) No results for input(s): PROBNP in the last 8760 hours.  DM  labs:  HbA1C: Recent Labs    11/16/18 0945  HGBA1C 7.6*       CBG (last 3)  No results for  input(s): GLUCAP in the last 72 hours.  UA ordered   Ordered    CXR -   NON acute   CTA chest - no PE, some persistent infiltrates      ED Triage Vitals  Enc Vitals Group     BP 12/23/18 1439 (!) 156/79     Pulse Rate 12/23/18 1439 (!) 102     Resp 12/23/18 1439 14     Temp 12/23/18 1439 98.9 F (37.2 C)     Temp Source 12/23/18 1439 Oral     SpO2 12/23/18 1439 92 %     Weight --      Height --      Head Circumference --      Peak Flow --      Pain Score 12/23/18 1434 9     Pain Loc --      Pain Edu? --      Excl. in [place]? --   TMAX(24)@       Latest  Blood pressure (!) 145/56, pulse 100, temperature 98.9 F (37.2 C), temperature source Oral, resp. rate (!) 22, SpO2 98 %.     Hospitalist was called for admission for POST COVID dyspnea   Review of Systems:    Pertinent positives include:  Fatigue, leg swelling, Bilateral lower extremity swelling   Constitutional:  No weight loss, night sweats, Fevers, chills, weight loss  HEENT:  No headaches, Difficulty swallowing,Tooth/dental problems, Sore throat,  No sneezing, itching, ear ache, nasal congestion, post nasal drip,  Cardio-vascular:  No chest pain, Orthopnea, PND, anasarca, dizziness, palpitations.  GI:  No heartburn, indigestion, abdominal pain, nausea, vomiting, diarrhea, change in bowel habits, loss of appetite, melena, blood in stool, hematemesis Resp:  no shortness of breath at rest. No dyspnea on exertion, No excess mucus, no productive cough, No non-productive cough, No coughing up of blood.No change in color of mucus. No wheezing. Skin:  no rash or lesions. No jaundice GU:  no dysuria, change in color of urine, no urgency or frequency. No straining to urinate.  No flank pain.  Musculoskeletal:  No joint pain or no joint swelling. No decreased range of motion. No back pain.  Psych:  No change in mood or affect. No depression or anxiety. No memory loss.  Neuro: no localizing neurological  complaints, no tingling, no weakness, no double vision, no gait abnormality, no slurred speech, no confusion  All systems reviewed and apart from [place] all are negative  Past Medical History:   Past Medical History:  Diagnosis Date   Diabetes mellitus without complication ([place])    Hypersensitivity pneumonitis ([place]) 12/19/2017   Hypertension       No past surgical history on file.  Social History:  Ambulatory  Independently      reports that she has never smoked. She has never used smokeless tobacco. She reports that she does not drink alcohol or use drugs.  Family History:   Family History  Problem Relation Age of Onset   Diabetes Other    Hypertension Other     Allergies: Allergies  Allergen Reactions   Other Shortness Of Breath and Other (See Comments)    Newspaper ink =  new chest pain, also   Iodine Other (See Comments)    "Was a long time ago" (Reaction??)   Merbromin Other (See Comments)    Mercurochrome- "Was a long time ago" (Reaction??)     Prior to Admission medications   Medication Sig Start Date End Date Taking? Authorizing Provider  acetaminophen (TYLENOL) 500 MG tablet  Take 1,000 mg by mouth 2 (two) times daily as needed (for pain).    [provider]  albuterol (PROAIR HFA) 108 (90 Base) MCG/ACT inhaler Inhale 2 puffs into the lungs every 6 (six) hours as needed for wheezing or shortness of breath. 12/03/18   Kerin Perna, NP  albuterol (PROVENTIL) (2.5 MG/3ML) 0.083% nebulizer solution Take 3 mLs (2.5 mg total) by nebulization every 6 (six) hours as needed for wheezing or shortness of breath. 12/03/18   Kerin Perna, NP  atorvastatin (LIPITOR) 20 MG tablet Take 1 tablet (20 mg total) by mouth at bedtime. 12/03/18   Kerin Perna, NP  Azelastine-Fluticasone (DYMISTA NA) Place 1-2 sprays into both nostrils daily as needed (for allergies).     [provider]  blood glucose meter kit and supplies KIT Dispense based  on patient and insurance preference. Use up to four times daily as directed. (FOR ICD-9 250.00, 250.01). For QAC - HS accuchecks. 11/22/18   Thurnell Lose, MD  diphenhydrAMINE (BENADRYL) 50 MG tablet Take 0.5 tablets (25 mg total) by mouth at bedtime as needed for itching. 12/03/18   Kerin Perna, NP  FLUoxetine (PROZAC) 20 MG tablet Take 1 tablet (20 mg total) by mouth daily. 12/03/18   Kerin Perna, NP  gabapentin (NEURONTIN) 300 MG capsule Take 300 mg by mouth 2 (two) times daily. 07/29/18   [provider]  glucose blood (FREESTYLE LITE) test strip For glucose testing every before meals at bedtime. Diagnosis E 11.65  Can substitute to any accepted brand 11/22/18   Thurnell Lose, MD  insulin aspart (NOVOLOG) 100 UNIT/ML injection Before each meal 3 times a day , 140-199 - 2 units, 200-250 - 4 units, 251-299 - 6 units,  300-349 - 8 units,  350 or above 10 units. Insulin PEN if approved, provide syringes and needles if needed. Can switch to any other cheaper alternative. Patient not taking: Reported on 12/03/2018 11/22/18   Thurnell Lose, MD  Insulin Lispro Prot & Lispro (HUMALOG MIX 75/25 KWIKPEN) (75-25) 100 UNIT/ML Kwikpen Inject 40 Units into the skin See admin instructions. Inject 50 units into the skin before breakfast "and a second dose at bedtime 30 units 12/03/18   Kerin Perna, NP  loratadine (CLARITIN) 10 MG tablet Take 10 mg by mouth daily as needed for allergies or rhinitis.     [provider]  Melatonin 1 MG TABS Take 5 tablets (5 mg total) by mouth at bedtime as needed. 12/03/18   Kerin Perna, NP  metFORMIN (GLUCOPHAGE) 1000 MG tablet Take 1 tablet (1,000 mg total) by mouth See admin instructions. Take 1,000 mg by mouth in the morning with breakfast "and a second dose of 1,000 mg daily if BGL is still elevated" 12/03/18   Kerin Perna, NP  metoprolol (TOPROL-XL) 200 MG 24 hr tablet Take 1 tablet (200 mg total) by mouth 2 (two) times  daily. 12/03/18   Kerin Perna, NP  montelukast (SINGULAIR) 10 MG tablet Take 1 tablet (10 mg total) by mouth at bedtime as needed (for seasonal allergies). 12/03/18   Kerin Perna, NP  olmesartan (BENICAR) 40 MG tablet Take 1 tablet (40 mg total) by mouth daily. 12/03/18   Kerin Perna, NP  omeprazole (PRILOSEC) 20 MG capsule Take 1 capsule (20 mg total) by mouth daily. 12/03/18   Kerin Perna, NP  oxybutynin (DITROPAN-XL) 10 MG 24 hr tablet Take 1 tablet (10 mg total) by  mouth daily as needed (for symptoms of bladder instability). 12/03/18   Kerin Perna, NP  polyethylene glycol powder (MIRALAX) 17 GM/SCOOP powder Take 17 g by mouth daily as needed for mild constipation. 12/03/18   Kerin Perna, NP  Vitamin D, Ergocalciferol, (DRISDOL) 50000 units CAPS capsule Take 50,000 Units by mouth every Thursday.    [provider]   Physical Exam: Blood pressure (!) 145/56, pulse 100, temperature 98.9 F (37.2 C), temperature source Oral, resp. rate (!) 22, SpO2 98 %. 1. General:  in No  Acute distress   Chronically ill  -appearing 2. Psychological: Alert and  Oriented 3. Head/ENT:   Dry Mucous Membranes                          Head Non traumatic, neck supple                          Poor Dentition 4. SKIN:  Normal Skin turgor,  Skin clean Dry and intact no rash 5. Heart: Regular rate and rhythm no Murmur, no Rub or gallop 6. Lungs: distant no wheezes or crackles   7. Abdomen: Soft,  non-tender, Non distended  obese  bowel sounds present 8. Lower extremities: no clubbing, cyanosis, bilateral edema 9. Neurologically Grossly intact, moving all 4 extremities equally  10. MSK: Normal range of motion   All other LABS:     Recent Labs  Lab 12/23/18 1446  WBC 5.0  HGB 12.0  HCT 37.7  MCV 100.0  PLT 87*     Recent Labs  Lab 12/23/18 1446  NA 140  K 3.9  CL 102  CO2 29  GLUCOSE 175*  BUN 7*  CREATININE 0.64  CALCIUM 9.9     No results  for input(s): AST, ALT, ALKPHOS, BILITOT, PROT, ALBUMIN in the last 168 hours.     Cultures:    Component Value Date/Time   SDES BLOOD LEFT HAND 11/15/2018 2305   SPECREQUEST  11/15/2018 2305    BOTTLES DRAWN AEROBIC AND ANAEROBIC Blood Culture adequate volume   CULT  11/15/2018 2305    NO GROWTH 5 DAYS Performed at [facility], [place] [address], [place], [place] [address]    REPTSTATUS 11/21/2018 FINAL 11/15/2018 2305     Radiological Exams on Admission: Dg Chest 2 View  Result Date: 12/23/2018 CLINICAL DATA:  Shortness of breath EXAM: CHEST - 2 VIEW COMPARISON:  12/17/2018 FINDINGS: The heart size and mediastinal contours are stable. Minimal bibasilar opacities, improving compared to prior. No new focal airspace opacity. No pleural effusion or pneumothorax. The visualized skeletal structures are unremarkable. IMPRESSION: Improving bibasilar airspace opacities. Electronically Signed   By: Davina Poke M.D.   On: 12/23/2018 15:25   Ct Angio Chest Pe W And/or Wo Contrast  Result Date: 12/23/2018 CLINICAL DATA:  Clinically suspected pulmonary embolus. EXAM: CT ANGIOGRAPHY CHEST WITH CONTRAST TECHNIQUE: Multidetector CT imaging of the chest was performed using the standard protocol during bolus administration of intravenous contrast. Multiplanar CT image reconstructions and MIPs were obtained to evaluate the vascular anatomy. CONTRAST:  137m OMNIPAQUE IOHEXOL 350 MG/ML SOLN COMPARISON:  December 17, 2017 FINDINGS: Cardiovascular: Preferential opacification of the thoracic aorta. No evidence of thoracic aortic aneurysm or dissection. Normal heart size. No pericardial effusion. No central pulmonary embolus. The segmental and subsegmental branches are suboptimally visualized. Mediastinum/Nodes: 1.6 cm right thyroid hypoattenuated nodule. No evidence of lymphadenopathy. Normal appearance  of the trachea and esophagus. Lungs/Pleura: Mild chronic interstitial lung changes. Low lung volumes and  breathing motion artifact. Hazy interstitial and airspace opacities throughout both lungs with mosaic distribution and dependent predominance. Upper Abdomen: Peripheral calcifications within the gallbladder. No evidence of biliary ductal dilation. Musculoskeletal: No chest wall abnormality. No acute or significant osseous findings. Review of the MIP images confirms the above findings. IMPRESSION: 1. No evidence of thoracic aortic aneurysm or dissection. 2. No evidence of central pulmonary embolus. The segmental and subsegmental branches are suboptimally visualized. 3. Hazy interstitial and airspace opacities throughout both lungs with mosaic distribution and dependent predominance. Differential diagnosis includes pulmonary edema, atypical multifocal pneumonia, or interstitial lung disease. 4. Peripheral calcifications within the gallbladder. This may represent cholelithiasis, however gallbladder calcifications are also a consideration. Further evaluation with right upper quadrant ultrasound or nonemergent abdominal MRI may be considered. 5. 1.6 cm right thyroid hypoattenuated nodule. Further evaluation with thyroid ultrasound may be considered. Aortic Atherosclerosis (ICD10-I70.0). Electronically Signed   By: Fidela Salisbury M.D.   On: 12/23/2018 19:05    Chart has been reviewed    Assessment/Plan  68 y.o. female with medical history significant of recent COVID infection, DM 2, HTN, morbidly obese, seasonal allergies  Admitted for post covid dyspenea vs diastolic CHF  Present on Admission: Past hx of COVID infection -unclear if ongoing given that patient have had progressive dyspnea with no improvement in symptoms with persistent cough, patient was never febrile during her illness,  last GI symptoms was 1 week ago Imaging showing persistent infiltrates patient with worsening hypoxia Will consult ID to help with management Repeat inflammatory markers Obtain sputum cultures and respiratory panel to  see if there is a contributing factors Would benefit from echogram to evaluate for post COVID heart failure versus  Acute diastolic CHF exacerbation. If still positive for COvid would need to consider active disease vs dead virus shedding unsure if would   Possible acute diastolic CHF exacerbation - will diurese and monitor on telemetry Cycle CE obtain echo   Acute respiratory failure with hypoxia ([place]) - unclear etiology, could be post COVID persistent pneumonitis, versus CHF exacerbation patient does have history of mild diastolic CHF although normal BNP but does have evidence of diffuse fluid overload on clinical exam. Given persistent symptoms repeat call with testing and if positive may need to be readmitted back to [place] would benefit from CHF work-up Will diurese and see if this has improved dyspnea Patient was not febrile throughout her illness with no elevated white blood cell count Clinical picture is not consistent with bacterial superinfection No evidence of PE on CT scan   Thrombocytopenia ([facility]) -persistent for past 1 month unclear if related to COVID   DM 2 -  - Order Sensitive  SSI   -  check TSH and HgA1C  - Hold by mouth medications    Underlining lung disease - appreciate pulmonology consult, will need additional testing  Other plan as per orders.  DVT prophylaxis:  SCD   Code Status:  FULL CODE  as per patient   I had personally discussed CODE STATUS with patient    Family Communication:   Family at  Bedside  plan of care was discussed  with   Daughter   Disposition Plan:   To home once workup is complete and patient is stable                     Would benefit from PT/OT  eval prior to DC  Ordered                                       Consults called: none  Admission status:  ED Disposition    ED Disposition Condition Superior: [place] [[id number]]  Level of Care: Progressive [102]  Covid  Evaluation: Person Under Investigation (PUI)  Diagnosis: Dyspnea [830940]  Admitting Physician: Toy Baker [[id number]]  Attending Physician: Toy Baker [[id number]]  Estimated length of stay: 3 - 4 days  Certification:: I certify this patient will need inpatient services for at least 2 midnights  PT Class (Do Not Modify): Inpatient [101]  PT Acc Code (Do Not Modify): Private [1]         inpatient     Expect 2 midnight stay secondary to severity of patient's current illness including   hemodynamic instability despite optimal treatment (tachycardian tachypnea  Hypoxia )  Severe lab/radiological/exam abnormalities including:  Pulmonary edema vs infiltrates   and extensive comorbidities including:  DM2    CHF Morbid Obesity   That are currently affecting medical management.   I expect  patient to be hospitalized for 2 midnights requiring inpatient medical care.  Patient is at high risk for adverse outcome (such as loss of life or disability) if not treated.  Indication for inpatient stay as follows:    Hemodynamic instability despite maximal medical therapy,     persistent severe dyspnea  despite medical management   worsening hypoxia  Need for IV antibiotics, IV diuretics     Level of care         SDU tele indefinitely please discontinue once patient no longer qualifies  Precautions  Airborne and Contact precautions  PPE: Used by the provider:   P100  eye Goggles,  Gloves  gown   Akaila Rambo 12/23/2018, 11:16 PM    [facility]     after 2 AM please page floor coverage PA If 7AM-7PM, please contact the day team taking care of the patient using [email or web address]

## 2018-12-24 ENCOUNTER — Encounter (HOSPITAL_COMMUNITY): Payer: Self-pay | Admitting: General Practice

## 2018-12-24 ENCOUNTER — Inpatient Hospital Stay (HOSPITAL_COMMUNITY): Payer: Medicare Other

## 2018-12-24 DIAGNOSIS — J9601 Acute respiratory failure with hypoxia: Secondary | ICD-10-CM

## 2018-12-24 DIAGNOSIS — I5031 Acute diastolic (congestive) heart failure: Secondary | ICD-10-CM

## 2018-12-24 DIAGNOSIS — E669 Obesity, unspecified: Secondary | ICD-10-CM

## 2018-12-24 DIAGNOSIS — J189 Pneumonia, unspecified organism: Secondary | ICD-10-CM

## 2018-12-24 DIAGNOSIS — R932 Abnormal findings on diagnostic imaging of liver and biliary tract: Secondary | ICD-10-CM

## 2018-12-24 DIAGNOSIS — D696 Thrombocytopenia, unspecified: Secondary | ICD-10-CM

## 2018-12-24 DIAGNOSIS — J849 Interstitial pulmonary disease, unspecified: Principal | ICD-10-CM

## 2018-12-24 HISTORY — DX: Interstitial pulmonary disease, unspecified: J84.9

## 2018-12-24 LAB — CBC WITH DIFFERENTIAL/PLATELET
Abs Immature Granulocytes: 0 10*3/uL (ref 0.00–0.07)
Basophils Absolute: 0 10*3/uL (ref 0.0–0.1)
Basophils Relative: 0 %
Eosinophils Absolute: 0 10*3/uL (ref 0.0–0.5)
Eosinophils Relative: 0 %
HCT: 38 % (ref 36.0–46.0)
Hemoglobin: 12.2 g/dL (ref 12.0–15.0)
Lymphocytes Relative: 21 %
Lymphs Abs: 0.7 10*3/uL (ref 0.7–4.0)
MCH: 31.6 pg (ref 26.0–34.0)
MCHC: 32.1 g/dL (ref 30.0–36.0)
MCV: 98.4 fL (ref 80.0–100.0)
Monocytes Absolute: 0 10*3/uL — ABNORMAL LOW (ref 0.1–1.0)
Monocytes Relative: 1 %
Neutro Abs: 2.7 10*3/uL (ref 1.7–7.7)
Neutrophils Relative %: 78 %
Platelets: 80 10*3/uL — ABNORMAL LOW (ref 150–400)
RBC: 3.86 MIL/uL — ABNORMAL LOW (ref 3.87–5.11)
RDW: 20.7 % — ABNORMAL HIGH (ref 11.5–15.5)
WBC: 3.4 10*3/uL — ABNORMAL LOW (ref 4.0–10.5)
nRBC: 0.9 % — ABNORMAL HIGH (ref 0.0–0.2)

## 2018-12-24 LAB — CBC
HCT: 38.3 % (ref 36.0–46.0)
Hemoglobin: 12.3 g/dL (ref 12.0–15.0)
MCH: 31.5 pg (ref 26.0–34.0)
MCHC: 32.1 g/dL (ref 30.0–36.0)
MCV: 98 fL (ref 80.0–100.0)
Platelets: 86 10*3/uL — ABNORMAL LOW (ref 150–400)
RBC: 3.91 MIL/uL (ref 3.87–5.11)
RDW: 20.9 % — ABNORMAL HIGH (ref 11.5–15.5)
WBC: 5.3 10*3/uL (ref 4.0–10.5)
nRBC: 0.8 % — ABNORMAL HIGH (ref 0.0–0.2)

## 2018-12-24 LAB — COMPREHENSIVE METABOLIC PANEL
ALT: 13 U/L (ref 0–44)
AST: 19 U/L (ref 15–41)
Albumin: 4.1 g/dL (ref 3.5–5.0)
Alkaline Phosphatase: 58 U/L (ref 38–126)
Anion gap: 10 (ref 5–15)
BUN: 7 mg/dL — ABNORMAL LOW (ref 8–23)
CO2: 28 mmol/L (ref 22–32)
Calcium: 9.7 mg/dL (ref 8.9–10.3)
Chloride: 100 mmol/L (ref 98–111)
Creatinine, Ser: 0.65 mg/dL (ref 0.44–1.00)
GFR calc Af Amer: >60 mL/min (ref >60)
GFR calc non Af Amer: >60 mL/min (ref >60)
Glucose, Bld: 222 mg/dL — ABNORMAL HIGH (ref 70–99)
Potassium: 3.8 mmol/L (ref 3.5–5.1)
Sodium: 138 mmol/L (ref 135–145)
Total Bilirubin: 1.9 mg/dL — ABNORMAL HIGH (ref 0.3–1.2)
Total Protein: 7.2 g/dL (ref 6.5–8.1)

## 2018-12-24 LAB — GLUCOSE, CAPILLARY
Glucose-Capillary: 144 mg/dL — ABNORMAL HIGH (ref 70–99)
Glucose-Capillary: 237 mg/dL — ABNORMAL HIGH (ref 70–99)
Glucose-Capillary: 316 mg/dL — ABNORMAL HIGH (ref 70–99)

## 2018-12-24 LAB — RESPIRATORY PANEL BY PCR

## 2018-12-24 LAB — VITAMIN B12: Vitamin B-12: 650 pg/mL (ref 180–914)

## 2018-12-24 LAB — IRON AND TIBC
Iron: 66 ug/dL (ref 28–170)
Saturation Ratios: 23 % (ref 10.4–31.8)
TIBC: 293 ug/dL (ref 250–450)
UIBC: 227 ug/dL

## 2018-12-24 LAB — CBG MONITORING, ED
Glucose-Capillary: 228 mg/dL — ABNORMAL HIGH (ref 70–99)
Glucose-Capillary: 189 mg/dL — ABNORMAL HIGH (ref 70–99)

## 2018-12-24 LAB — PHOSPHORUS: Phosphorus: 3.2 mg/dL (ref 2.5–4.6)

## 2018-12-24 LAB — ECHOCARDIOGRAM COMPLETE

## 2018-12-24 LAB — MAGNESIUM: Magnesium: 1.9 mg/dL (ref 1.7–2.4)

## 2018-12-24 MED ORDER — FUROSEMIDE 10 MG/ML IJ SOLN
40.0000 mg | Freq: Two times a day (BID) | INTRAMUSCULAR | Status: DC
  Administered 2018-12-24 – 2018-12-25 (×2): 40 mg via INTRAVENOUS
  Filled 2018-12-24 (×2): qty 4

## 2018-12-24 MED ORDER — ALBUTEROL SULFATE (2.5 MG/3ML) 0.083% IN NEBU: 2.5000 mg | INHALATION_SOLUTION | Freq: Four times a day (QID) | RESPIRATORY_TRACT | Status: DC | PRN

## 2018-12-24 MED ORDER — METHYLPREDNISOLONE SODIUM SUCC 40 MG IJ SOLR
40.0000 mg | Freq: Two times a day (BID) | INTRAMUSCULAR | Status: DC
  Administered 2018-12-24 – 2018-12-26 (×5): 40 mg via INTRAVENOUS
  Filled 2018-12-24 (×5): qty 1

## 2018-12-24 NOTE — Progress Notes (Signed)
Spoke with Dr Roel Cluck earlier in evening and discussed sepsis tracking being ordered by offloading MD. Will cancel sepsis, concern for lung disease. Monitored in [place] for acute decompensation.

## 2018-12-24 NOTE — ED Notes (Signed)
Pt CBG was 189, notified Cindy(RN)

## 2018-12-24 NOTE — ED Notes (Signed)
SDU

## 2018-12-24 NOTE — Progress Notes (Signed)
Patient has ordered abdominal US that she need to be NPO for. Patient was offered to be NPO now for 6 hours or after midnight by Ultrasound Tech . Patient decided to be NPO after midnight. [place] informed, will informed nigh shift RN as well.

## 2018-12-24 NOTE — Progress Notes (Signed)
PROGRESS NOTE  Kathy Irwin WIO:[id number] DOB: [birth year] DOA: 12/23/2018 PCP: Kerin Perna, NP  Brief History   68 year old woman PMH COVID-19 infection PRESENTED 9/7 for worsening shortness of breath, dizziness, dyspnea on exertion cough, developing lower extremity edema.  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 --Status post COVID-19 viral pneumonitis discharged 8/7, treated with steroids, remdesivir, Actemra x2; discharged on 3 L nasal cannula.  Previously used 2 L oxygen at night. 9/7 SARS-CoV-2 negative --PMH steroid responsive ILD.  Solu-Medrol 40 mg twice daily per pulmonology. --Elevated left hemidiaphragm, dysfunction versus ILD induced volume loss --Echocardiogram demonstrates diastolic dysfunction. --Outpatient follow-up with pulmonology  Progressive dyspnea on exertion, lower extremity edema secondary to acute diastolic CHF. --CTA chest no PE, aneurysm or dissection.  Hazy interstitial and airspace opacities throughout both lungs differential includes pulmonary edema, atypical multifocal pneumonia, interstitial lung disease. --Suspect acute diastolic CHF based on echocardiogram and significant volume overload.  Albumin 4.1. --Continue IV diuresis.  Follow daily chemistry panel.  Isolated hyperbilirubinemia, significance unclear. --CMP in a.m.  Chronic thrombocytopenia seen September 2019. --Etiology unclear.  Has remained stable.  Close outpatient follow-up now that she lives here.  Diabetes mellitus type 2 --Stable.  Essential hypertension --Stable.  Morbid obesity --Dietitian consult  Peripheral calcifications within the gallbladder. This may represent cholelithiasis, however gallbladder calcifications are also a consideration.  --Will start with right upper quadrant ultrasound.  If unrevealing, will proceed with MRI.  1.6 cm right thyroid hypoattenuated nodule.  -- Further evaluation with thyroid  ultrasound may be considered as an outpatient.  Aortic Atherosclerosis (ICD10-I70.0). --continue statin   Resolved Hospital Problem list       DVT prophylaxis: SCDs Code Status: Full Family Communication: none Disposition Plan: home    Murray Hodgkins, MD  [facility] Direct contact: see [email or web address] (further directions at bottom of note if needed) 7PM-7AM contact night coverage as at bottom of note 12/24/2018, 8:47 AM  LOS: 1 day   Significant Hospital Events   . 9/7 admitted for dyspnea on exertion, possible acute diastolic CHF   Consults:  . Pulmonology 9/7   Procedures:  .   Significant Diagnostic Tests:  . 9/7 SARS-CoV-2 negative.  Inflammatory markers unremarkable. . 9/7 CTA chest > no PE, aneurysm or dissection.  Hazy interstitial and airspace opacities throughout both lungs differential includes pulmonary edema, atypical multifocal pneumonia, interstitial lung disease. . 9/8 echocardiogram LVEF 60-65%.  Impaired relaxation.  No regional wall motion abnormalities.  Normal RV systolic function   Micro Data:  .    Antimicrobials:  .   Interval History/Subjective  Feels about the same.  Short of breath.  No nausea or vomiting.  Lower extremity edema is new.  She moved back to [place] after last discharge, she was seen by a physician but reports no answers obtained in regard to lungs.  Gallbladder was not further evaluated.  She has now moved to [place].  Objective   Vitals:  Vitals:   12/24/18 0715 12/24/18 0730  BP: (!) 146/69 139/72  Pulse: 85 81  Resp: (!) 36 (!) 24  Temp:    SpO2: 90% 91%    Exam:  Constitutional:  . Appears calm, mildly uncomfortable, nontoxic Eyes:  Marland Kitchen Appear grossly normal ENMT:  . grossly normal hearing  . Lips appear normal Respiratory:  . CTA bilaterally, no w/r/r.  . Respiratory effort normal.  Cardiovascular:  . RRR, no m/r/g . 3+ bilateral LE extremity edema  Abdomen:  . Obese, soft, nontender,  nondistended.  No masses appreciated. Psychiatric:  . Mental status o Mood, affect appropriate . judgment and insight appear intact     I have personally reviewed the following:   Today's Data  . CBG stable . BMP unremarkable . Magnesium and phosphorus within normal limits . LFTs unremarkable other than modest elevation of total bilirubin. . WBC 5.3, hemoglobin 12.3, platelets 86.  Scheduled Meds: . atorvastatin  20 mg Oral QHS  . FLUoxetine  20 mg Oral Daily  . furosemide  40 mg Intravenous Daily  . gabapentin  300 mg Oral BID  . insulin aspart  0-5 Units Subcutaneous QHS  . insulin aspart  0-9 Units Subcutaneous TID WC  . insulin glargine  30 Units Subcutaneous QHS  . metoprolol  200 mg Oral BID  . pantoprazole  40 mg Oral Daily  . sodium chloride flush  3 mL Intravenous Once  . sodium chloride flush  3 mL Intravenous Q12H   Continuous Infusions: . sodium chloride      Active Problems:   Pneumonitis   DM (diabetes mellitus) (HCC)   Acute respiratory failure with hypoxia (HCC)   Thrombocytopenia (HCC)   Dyspnea   LOS: 1 day   How to contact the [facility] Attending or Consulting provider [place] or covering provider during after hours [place], for this patient?  1. Check the care team in [facility] and look for a) attending/consulting [facility] provider listed and b) the [facility] team listed 2. Log into [email or web address] and use [place]'s universal password to access. If you do not have the password, please contact the hospital operator. 3. Locate the [facility] provider you are looking for under [facility] and page to a number that you can be directly reached. 4. If you still have difficulty reaching the provider, please page the [facility] (Director on Call) for the Hospitalists listed on amion for assistance.

## 2018-12-24 NOTE — Consult Note (Signed)
NAME:  Kathy Irwin, MRN:  [id number], DOB:  1951-02-03, LOS: 1 ADMISSION DATE:  12/23/2018, CONSULTATION DATE:  12/24/2018 REFERRING MD:  Sarajane Jews, CHIEF COMPLAINT:  SOB  Brief History   68 yo F COVID-19 infection in [place], presenting 9/7 for SOB.   History of present illness   History obtained from patient and chart review   68 yo F PMH recent COVID-19 infection (discharged on 3LNC), HTN, DM2, morbid obesity, ILD  presented to ED 9/7 with SOB and DOE. DOE is worsened with oxygen non-compliance; patient reports on exertion she feels SOB and on exertion if not wearing her oxygen she feels dizzy. Endorses cough, producing clear-white phlegm, which has been slowly improving since COVID-19 discharge. Endorses BLE and truncal edema, worsening x 1 week. No fever, chills, fever.   Past Medical History  COVID-19 DM2 Morbid obesity HTN Allergic rhinitis  ILD    Significant Hospital Events    9/7 presented to ED, given lasix  9/8 Awaiting bed assignment, remains in ED. Given lasix again. Seen by PCCM  Consults:  PCCM   Procedures:    Significant Diagnostic Tests:  9/7 CTA chest > no PE, aneurysm or dissection.  Hazy interstitial and airspace opacities throughout both lungs differential includes pulmonary edema, atypical multifocal pneumonia, interstitial lung disease. Elevated L hemidiaphragm  9/7 CXR> . Bibasilar opacities.  Micro Data:      9/7 SARS-CoV-2> neg   Antimicrobials:      none  Interim history/subjective:  Comfortable on 2LNC in ED.   Objective   Blood pressure 139/72, pulse 81, temperature 98.9 F (37.2 C), temperature source Oral, resp. rate (!) 24, SpO2 91 %.       No intake or output data in the 24 hours ending 12/24/18 1052 There were no vitals filed for this visit.  Examination: General: Obese appearing older adult F, supine on ED stretcher on 2LNC, NAD  HENT: NCAT. Pink mmm. Anicteric sclera. patent nares with 2LNC in place Lungs: CTA but diminished  throughout Cardiovascular: RRR s1s2 no rgm  Abdomen: obese, soft, round, ndnt Extremities: 2+ BLE pitting edema. Equivocal clubbing with thick pink nail polish. Symmetrical MSK bulk and tone Neuro: AAOx4, following commands  GU: defer Skin: clean, dry, warm, without rash  Resolved Hospital Problem list     Assessment & Plan:   Acute on chronic respiratory failure  -Hx steroid responsive ILD -Hx COVID-19 pneumonia, discharged on 3LNC  -Fluid overloaded on physical exam, possible pulmonary edema on imaging  -Elevated L hemidiaphragm on CT P -Sniff test to evaluate hemidiaphragm  -Agree with diuresis-- will increase from 40 qD to 40 BID -Recommend steroids: solumedrol 24m BID -Supplemental O2 for SpO2 > 92%  - follow BMP, mag, phos in AM - Correct possible electrolyte abnormalities with diureses PRN, if increased Cr can de-escalate lasix to qD - no need for serial CXR - Follow up ECHO  - Will need OP Pulmonary follow up with [facility]   PCCM will see again 12/25/2018   Best practice:  Diet: carb modified  Pain/Anxiety/Delirium protocol (if indicated): na VAP protocol (if indicated): na DVT prophylaxis: SCD  GI prophylaxis: protonix  Glucose control: SSI Mobility: as tolerated  Code Status: Full  Family Communication: Patient updated at bedside Disposition: tele   Labs   CBC: Recent Labs  Lab 12/23/18 1446 12/23/18 2218 12/24/18 0340  WBC 5.0  --  5.3  HGB 12.0 13.3 12.3  HCT 37.7 39.0 38.3  MCV 100.0  --  98.0  PLT 87*  --  86*    Basic Metabolic Panel: Recent Labs  Lab 12/23/18 1446 12/23/18 2218 12/24/18 0340  NA 140 141 138  K 3.9 4.1 3.8  CL 102  --  100  CO2 29  --  28  GLUCOSE 175*  --  222*  BUN 7*  --  7*  CREATININE 0.64  --  0.65  CALCIUM 9.9  --  9.7  MG  --   --  1.9  PHOS  --   --  3.2   GFR: CrCl cannot be calculated (Unknown ideal weight.). Recent Labs  Lab 12/23/18 1446 12/23/18 2209 12/24/18 0340  PROCALCITON  --  <0.10  --    WBC 5.0  --  5.3    Liver Function Tests: Recent Labs  Lab 12/23/18 2209 12/24/18 0340  AST 17 19  ALT 13 13  ALKPHOS 55 58  BILITOT 1.8* 1.9*  PROT 7.1 7.2  ALBUMIN 3.8 4.1   No results for input(s): LIPASE, AMYLASE in the last 168 hours. No results for input(s): AMMONIA in the last 168 hours.  ABG    Component Value Date/Time   HCO3 31.6 (H) 12/23/2018 2218   TCO2 33 (H) 12/23/2018 2218   O2SAT 93.0 12/23/2018 2218     Coagulation Profile: No results for input(s): INR, PROTIME in the last 168 hours.  Cardiac Enzymes: No results for input(s): CKTOTAL, CKMB, CKMBINDEX, TROPONINI in the last 168 hours.  HbA1C: Hgb A1c MFr Bld  Date/Time Value Ref Range Status  11/16/2018 09:45 AM 7.6 (H) 4.8 - 5.6 % Final    Comment:    (NOTE) Pre diabetes:          5.7%-6.4% Diabetes:              >6.4% Glycemic control for   <7.0% adults with diabetes     CBG: Recent Labs  Lab 12/24/18 0149 12/24/18 0814  GLUCAP 228* 189*    Review of Systems:   As per HPI   Past Medical History  She,  has a past medical history of Diabetes mellitus without complication ([place]), Hypersensitivity pneumonitis ([place]) (12/19/2017), and Hypertension.   Surgical History   No past surgical history on file.   Social History   reports that she has never smoked. She has never used smokeless tobacco. She reports that she does not drink alcohol or use drugs.   Family History   Her family history includes Diabetes in an other family member; Hypertension in an other family member.   Allergies Allergies  Allergen Reactions  . Other Shortness Of Breath and Other (See Comments)    Newspaper ink =  new chest pain, also  . Iodine Other (See Comments)    "Was a long time ago" (Reaction??)  . Merbromin Other (See Comments)    Mercurochrome- "Was a long time ago" (Reaction??)     Home Medications  Prior to Admission medications   Medication Sig Start Date End Date Taking? Authorizing Provider   acetaminophen (TYLENOL) 500 MG tablet Take 1,000 mg by mouth 2 (two) times daily as needed (for pain).   Yes [provider]  albuterol (PROAIR HFA) 108 (90 Base) MCG/ACT inhaler Inhale 2 puffs into the lungs every 6 (six) hours as needed for wheezing or shortness of breath. 12/03/18  Yes Kerin Perna, NP  albuterol (PROVENTIL) (2.5 MG/3ML) 0.083% nebulizer solution Take 3 mLs (2.5 mg total) by nebulization every 6 (six) hours as needed for wheezing or  shortness of breath. 12/03/18  Yes Kerin Perna, NP  atorvastatin (LIPITOR) 20 MG tablet Take 1 tablet (20 mg total) by mouth at bedtime. 12/03/18  Yes Kerin Perna, NP  Azelastine-Fluticasone (DYMISTA NA) Place 1-2 sprays into both nostrils daily as needed (for allergies).    Yes [provider]  blood glucose meter kit and supplies KIT Dispense based on patient and insurance preference. Use up to four times daily as directed. (FOR ICD-9 250.00, 250.01). For QAC - HS accuchecks. 11/22/18  Yes Thurnell Lose, MD  diphenhydrAMINE (BENADRYL) 50 MG tablet Take 0.5 tablets (25 mg total) by mouth at bedtime as needed for itching. 12/03/18  Yes Kerin Perna, NP  FLUoxetine (PROZAC) 20 MG tablet Take 1 tablet (20 mg total) by mouth daily. 12/03/18  Yes Kerin Perna, NP  gabapentin (NEURONTIN) 300 MG capsule Take 300 mg by mouth 2 (two) times daily. 07/29/18  Yes [provider]  Insulin Lispro Prot & Lispro (HUMALOG MIX 75/25 KWIKPEN) (75-25) 100 UNIT/ML Kwikpen Inject 40 Units into the skin See admin instructions. Inject 50 units into the skin before breakfast "and a second dose at bedtime 30 units 12/03/18  Yes Kerin Perna, NP  Melatonin 5 MG CAPS Take 1 capsule by mouth daily as needed (For sleep).   Yes [provider]  metoprolol (TOPROL-XL) 200 MG 24 hr tablet Take 1 tablet (200 mg total) by mouth 2 (two) times daily. 12/03/18  Yes Kerin Perna, NP  oxybutynin (DITROPAN-XL) 10  MG 24 hr tablet Take 1 tablet (10 mg total) by mouth daily as needed (for symptoms of bladder instability). 12/03/18  Yes Kerin Perna, NP  VITAMIN D PO Take 1 tablet by mouth daily.   Yes [provider]  glucose blood (FREESTYLE LITE) test strip For glucose testing every before meals at bedtime. Diagnosis E 11.65  Can substitute to any accepted brand 11/22/18   Thurnell Lose, MD     Eliseo Gum MSN, AGACNP-BC [place] [phone] If no answer, [phone] 12/24/2018, 10:52 AM

## 2018-12-24 NOTE — ED Notes (Signed)
Breakfast Ordered 

## 2018-12-24 NOTE — Progress Notes (Signed)
  Echocardiogram 2D Echocardiogram has been performed.  Shlonda Dolloff L Androw 12/24/2018, 10:22 AM

## 2018-12-25 ENCOUNTER — Other Ambulatory Visit (INDEPENDENT_AMBULATORY_CARE_PROVIDER_SITE_OTHER): Payer: Self-pay | Admitting: Primary Care

## 2018-12-25 ENCOUNTER — Inpatient Hospital Stay (HOSPITAL_COMMUNITY): Payer: Medicare Other

## 2018-12-25 DIAGNOSIS — E1165 Type 2 diabetes mellitus with hyperglycemia: Secondary | ICD-10-CM

## 2018-12-25 DIAGNOSIS — J9621 Acute and chronic respiratory failure with hypoxia: Secondary | ICD-10-CM

## 2018-12-25 LAB — COMPREHENSIVE METABOLIC PANEL
ALT: 14 U/L (ref 0–44)
AST: 17 U/L (ref 15–41)
Albumin: 3.8 g/dL (ref 3.5–5.0)
Alkaline Phosphatase: 56 U/L (ref 38–126)
Anion gap: 9 (ref 5–15)
BUN: 13 mg/dL (ref 8–23)
CO2: 28 mmol/L (ref 22–32)
Calcium: 10 mg/dL (ref 8.9–10.3)
Chloride: 99 mmol/L (ref 98–111)
Creatinine, Ser: 0.72 mg/dL (ref 0.44–1.00)
GFR calc Af Amer: >60 mL/min (ref >60)
GFR calc non Af Amer: >60 mL/min (ref >60)
Glucose, Bld: 248 mg/dL — ABNORMAL HIGH (ref 70–99)
Potassium: 4.4 mmol/L (ref 3.5–5.1)
Sodium: 136 mmol/L (ref 135–145)
Total Bilirubin: 1.4 mg/dL — ABNORMAL HIGH (ref 0.3–1.2)
Total Protein: 7.1 g/dL (ref 6.5–8.1)

## 2018-12-25 LAB — GLUCOSE, CAPILLARY
Glucose-Capillary: 233 mg/dL — ABNORMAL HIGH (ref 70–99)
Glucose-Capillary: 285 mg/dL — ABNORMAL HIGH (ref 70–99)
Glucose-Capillary: 258 mg/dL — ABNORMAL HIGH (ref 70–99)
Glucose-Capillary: 219 mg/dL — ABNORMAL HIGH (ref 70–99)

## 2018-12-25 LAB — PHOSPHORUS: Phosphorus: 2.8 mg/dL (ref 2.5–4.6)

## 2018-12-25 LAB — FOLATE RBC
Folate, Hemolysate: 429 ng/mL
Folate, RBC: 1132 ng/mL (ref >498)
Hematocrit: 37.9 % (ref 34.0–46.6)

## 2018-12-25 LAB — CBC
HCT: 37.5 % (ref 36.0–46.0)
Hemoglobin: 12.1 g/dL (ref 12.0–15.0)
MCH: 31.2 pg (ref 26.0–34.0)
MCHC: 32.3 g/dL (ref 30.0–36.0)
MCV: 96.6 fL (ref 80.0–100.0)
Platelets: 89 10*3/uL — ABNORMAL LOW (ref 150–400)
RBC: 3.88 MIL/uL (ref 3.87–5.11)
RDW: 20.4 % — ABNORMAL HIGH (ref 11.5–15.5)
WBC: 3.1 10*3/uL — ABNORMAL LOW (ref 4.0–10.5)
nRBC: 1.6 % — ABNORMAL HIGH (ref 0.0–0.2)

## 2018-12-25 LAB — HEMOGLOBIN A1C
Hgb A1c MFr Bld: 8.1 % — ABNORMAL HIGH (ref 4.8–5.6)
Mean Plasma Glucose: 186 mg/dL

## 2018-12-25 LAB — MAGNESIUM: Magnesium: 2 mg/dL (ref 1.7–2.4)

## 2018-12-25 MED ORDER — FUROSEMIDE 10 MG/ML IJ SOLN
60.0000 mg | Freq: Two times a day (BID) | INTRAMUSCULAR | Status: AC
  Administered 2018-12-25 – 2018-12-26 (×2): 60 mg via INTRAVENOUS
  Filled 2018-12-25 (×2): qty 6

## 2018-12-25 MED ORDER — INSULIN ASPART 100 UNIT/ML ~~LOC~~ SOLN
0.0000 [IU] | Freq: Every day | SUBCUTANEOUS | Status: DC
  Administered 2018-12-25: 2 [IU] via SUBCUTANEOUS

## 2018-12-25 MED ORDER — INSULIN ASPART 100 UNIT/ML ~~LOC~~ SOLN
0.0000 [IU] | Freq: Three times a day (TID) | SUBCUTANEOUS | Status: DC
  Administered 2018-12-25: 8 [IU] via SUBCUTANEOUS
  Administered 2018-12-26: 3 [IU] via SUBCUTANEOUS
  Administered 2018-12-26: 5 [IU] via SUBCUTANEOUS

## 2018-12-25 MED ORDER — INSULIN GLARGINE 100 UNIT/ML ~~LOC~~ SOLN
35.0000 [IU] | Freq: Every day | SUBCUTANEOUS | Status: DC
  Administered 2018-12-25: 35 [IU] via SUBCUTANEOUS
  Filled 2018-12-25 (×2): qty 0.35

## 2018-12-25 MED ORDER — ZOLPIDEM TARTRATE 5 MG PO TABS
5.0000 mg | ORAL_TABLET | Freq: Once | ORAL | Status: AC
  Administered 2018-12-25: 5 mg via ORAL
  Filled 2018-12-25: qty 1

## 2018-12-25 NOTE — Progress Notes (Signed)
Nutrition Brief Note  RD consulted for assessment of nutrition requirements and status for morbid obesity.   Wt Readings from Last 15 Encounters:  12/25/18 120.6 kg  11/15/18 117.9 kg  11/13/18 117.9 kg  12/17/17 119.3 kg   Kathy Irwin is a 68 y.o. female with medical history significant of recent COVID infection, DM 2, HTN, morbidly obese, seasonal allergies. Presented with persistent shortness of breath she continues to have shortness of breath on exertion although she tries to wear her oxygen she sometimes does not and when she walks her oxygen saturation drops and she feels very short of breath even while wearing oxygen she gets lightheaded with exertion and sometimes feels very dizzy.  Her cough has been getting somewhat better over past 1 month  Pt admitted with past history of COVID infection.  Spoke with pt over phone, who was pleasant and in good spirits today. She reports decreased appetite over the past 2 weeks, due to acute illness. Pt estimated that she consumed one meal per day at this time (usually a sandwich and soup)- she denies snacking or drinking between meals during this time period. At baseline, pt with good appetite, consuming 2 meals per day (Breakfast: oatmeal and banana and Dinner: baked chicken or stewed beef and potatoes). Pt daughter assists with meal preparation and pt is very grateful for her support.   Pt denies any recent wt loss. Per pt, UBW is around 266# and she has lost about 5 pounds over the past 9 months.   Discussed glycemic control regimen with pt. CBGS typically tun between 80-100. Pt shares that she is concerned over the cost of jardiance due to her not working, but combination insulin has worked well for her. Discussed role of steroids and its impact of glycemic control. Pt with no further questions regarding nutrition at this time, but expressed appreciation for visit.   Obesity is a complex, chronic health condition which is optimally managed by a  multi-disciplinary care team. Weight loss is not a desireable goal for an acute hospitalization. If further obesity work-up is warranted, consider referral to outpatient bariatric program and/or [facility].   Lab Results  Component Value Date   HGBA1C 8.1 (H) 12/24/2018   PTA DM medications are 75/25 50 units qam, 30 units qpm.   Labs reviewed: CBGS: 233-316 (inpatient orders for glycemic control are 0-5 units insulin aspart q HS, 0-9 units insulin aspart TID with meals, and 30 units insulin glargine daily).   Body mass index is 45.64 kg/m. Patient meets criteria for extreme obesity, class III based on current BMI.   Current diet order is carb modified, patient is consuming approximately 100% of meals at this time. Labs and medications reviewed.   No nutrition interventions warranted at this time. If nutrition issues arise, please consult RD.   Kailen Name A. Jimmye Norman, RD, LDN, [place] Registered Dietitian II Certified Diabetes Care and Education Specialist Pager: [phone] After hours Pager: [phone]

## 2018-12-25 NOTE — Progress Notes (Addendum)
NAME:  Kathy Irwin, MRN:  [id number], DOB:  [birth year], LOS: 2 ADMISSION DATE:  12/23/2018, CONSULTATION DATE:  12/24/2018 REFERRING MD:  Sarajane Jews, CHIEF COMPLAINT:  SOB  Brief History   68 yo F COVID-19 infection in [place], presenting 9/7 for SOB.   History of present illness   History obtained from patient and chart review   69 yo F PMH recent COVID-19 infection (discharged on 3LNC), HTN, DM2, morbid obesity, ILD  presented to ED 9/7 with SOB and DOE. DOE is worsened with oxygen non-compliance; patient reports on exertion she feels SOB and on exertion if not wearing her oxygen she feels dizzy. Endorses cough, producing clear-white phlegm, which has been slowly improving since COVID-19 discharge. Endorses BLE and truncal edema, worsening x 1 week. No fever, chills.   Past Medical History  COVID-19 DM2 Morbid obesity HTN Allergic rhinitis  ILD    Significant Hospital Events    9/7 presented to ED, given lasix  9/8 Awaiting bed assignment, remains in ED. Given lasix again. Seen by PCCM  Consults:  PCCM   Procedures:    Significant Diagnostic Tests:  9/7 CTA chest > no PE, aneurysm or dissection.  Hazy interstitial and airspace opacities throughout both lungs differential includes pulmonary edema, atypical multifocal pneumonia, interstitial lung disease. Elevated L hemidiaphragm 9/7 CXR> . Bibasilar opacities.         9/8 Echo> 1. The left ventricle has normal systolic function with an ejection fraction of 60-65%. The cavity size was normal. There is mildly increased left ventricular wall thickness. Left ventricular diastolic Doppler parameters are consistent with impaired relaxation. No evidence of left ventricular regional wall motion abnormalities.  2. There is mild mitral annular calcification present. No evidence of mitral valve stenosis. Trivial mitral regurgitation.  3. The aortic valve is tricuspid. Mild calcification of the aortic valve. Aortic valve regurgitation is  trivial by color flow Doppler. Mild stenosis of the aortic valve. Mean gradient 13 mmHg, AVA 1.7 cm^2. Small mobile calcification versus Lambl's  excrescence seen on the aortic valve, less likely vegetation.  4. The right ventricle has normal systolic function. The cavity was normal. There is no increase in right ventricular wall thickness.  5. Normal IVC size. PA systolic pressure 44 mmHg.  6. The aortic root is normal in size and structure.       9/8 Sniff test>      IMPRESSION:   Normal sniff test.  No evidence of hemidiaphragm paralysis. Micro Data:      9/7 SARS-CoV-2> neg      9/8 RVP negative   Antimicrobials:      none  Interim history/subjective:  Sitting up in chair, currently off O2 while cleaning her face. Speaking in full sentences, feels some better.   Objective   Blood pressure 126/81, pulse 68, temperature 98.1 F (36.7 C), temperature source Oral, resp. rate 20, height _0  (1.626 m), weight 120.6 kg, SpO2 99 %.        Intake/Output Summary (Last 24 hours) at 12/25/2018 0912 Last data filed at 12/25/2018 0749 Gross per 24 hour  Intake 1200 ml  Output 800 ml  Net 400 ml   Filed Weights   12/24/18 1320 12/25/18 0521  Weight: 118.4 kg 120.6 kg    Examination: General: Well-developed, obese. NAD. Sitting up in chair HENT: Normocephalic, PERRL. Moist mucus membranes Neck: No JVD. Trachea midline. No thyromegaly, no lymphadenopathy CV: RRR. S1S2. No MRG. +1 DP/PT distal pulses Lungs: BBS distant, diminished bases, FNL, symmetrical  ABD: +BS x4. SNT/ND. No masses, guarding or rigidity. + ascites  GU: No Foley EXT: MAE well. 3+ edema to shins Skin: PWD. In tact. No rashes or lesions Neuro: A&Ox3. CN II-XII in tact. No focal deficits Psych: Appropriate mood, insight and judgment for time and situation     Resolved Hospital Problem list     Assessment & Plan:   Acute on chronic respiratory failure  -Hx steroid responsive ILD -Hx COVID-19 pneumonia,  discharged on 3LNC  -Sniff test negative -echo normal EF with impaired relaxation, mild AV stenosis -iron/TIBC/B12/eosinophils normal  -IgE/folate pending  P -would continue to diurese with IV lasix 16m BID and get off as much fluid/edema as possible  -f/u IgE/foalte  -Recommend steroid taper-prednisone 455mQd x 2 weeks then 2026md x 2 weeks @ D/C  -F/U LBPulm January 23, 2019 0915 am -Supplemental O2 for SpO2 > 92%   Call if further questions or changes.  Best practice:  Diet: carb modified  Pain/Anxiety/Delirium protocol (if indicated): na VAP protocol (if indicated): na DVT prophylaxis: SCD  GI prophylaxis: protonix  Glucose control: SSI Mobility: as tolerated  Code Status: Full  Family Communication: Patient updated extensively at bedside Disposition: tele   Labs   CBC: Recent Labs  Lab 12/23/18 1446 12/23/18 2218 12/24/18 0340 12/24/18 1453 12/25/18 0434  WBC 5.0  --  5.3 3.4* 3.1*  NEUTROABS  --   --   --  2.7  --   HGB 12.0 13.3 12.3 12.2 12.1  HCT 37.7 39.0 38.3 38.0 37.5  MCV 100.0  --  98.0 98.4 96.6  PLT 87*  --  86* 80* 89*    Basic Metabolic Panel: Recent Labs  Lab 12/23/18 1446 12/23/18 2218 12/24/18 0340 12/25/18 0434  NA 140 141 138 136  K 3.9 4.1 3.8 4.4  CL 102  --  100 99  CO2 29  --  28 28  GLUCOSE 175*  --  222* 248*  BUN 7*  --  7* 13  CREATININE 0.64  --  0.65 0.72  CALCIUM 9.9  --  9.7 10.0  MG  --   --  1.9 2.0  PHOS  --   --  3.2 2.8   GFR: Estimated Creatinine Clearance: 86.2 mL/min (by C-G formula based on SCr of 0.72 mg/dL). Recent Labs  Lab 12/23/18 1446 12/23/18 2209 12/24/18 0340 12/24/18 1453 12/25/18 0434  PROCALCITON  --  <0.10  --   --   --   WBC 5.0  --  5.3 3.4* 3.1*    Liver Function Tests: Recent Labs  Lab 12/23/18 2209 12/24/18 0340 12/25/18 0434  AST _0 ALT _1 ALKPHOS 55 58 56  BILITOT 1.8* 1.9* 1.4*  PROT 7.1 7.2 7.1  ALBUMIN 3.8 4.1 3.8   No results for input(s): LIPASE,  AMYLASE in the last 168 hours. No results for input(s): AMMONIA in the last 168 hours.  ABG    Component Value Date/Time   HCO3 31.6 (H) 12/23/2018 2218   TCO2 33 (H) 12/23/2018 2218   O2SAT 93.0 12/23/2018 2218     Coagulation Profile: No results for input(s): INR, PROTIME in the last 168 hours.  Cardiac Enzymes: No results for input(s): CKTOTAL, CKMB, CKMBINDEX, TROPONINI in the last 168 hours.  HbA1C: Hgb A1c MFr Bld  Date/Time Value Ref Range Status  12/24/2018 03:40 AM 8.1 (H) 4.8 - 5.6 % Final    Comment:    (NOTE)  Prediabetes: 5.7 - 6.4         Diabetes: >6.4         Glycemic control for adults with diabetes: <7.0   11/16/2018 09:45 AM 7.6 (H) 4.8 - 5.6 % Final    Comment:    (NOTE) Pre diabetes:          5.7%-6.4% Diabetes:              >6.4% Glycemic control for   <7.0% adults with diabetes     CBG: Recent Labs  Lab 12/24/18 0814 12/24/18 1220 12/24/18 1703 12/24/18 2058 12/25/18 0656  GLUCAP 189* 144* 237* 316* 233*    Review of Systems:   No SOB, cough, fever or chills.   Francine Graven, MSN, AGACNP  Pager [phone] or if no answer [phone] [facility]   Attending attestation Seen in f/u for AE-ILD +/- volume overload Doing well, on RA at rest.  Still c/o LE swelling and DOE. Less crackles on exam today Echo with diastolic dysfunction, mildly elevated RVSP Would push diuresis if able, can consider switching to PO on discharge Consider compression stockings for LE Prednisone 40m/day x 2 weeks then 231mday x 2 weeks F/u with me or midlevel will be arranged in about a month Call if we can be of further help  DaErskine EmeryD

## 2018-12-25 NOTE — Progress Notes (Signed)
Inpatient Diabetes Program Recommendations  AACE/ADA: New Consensus Statement on Inpatient Glycemic Control (2015)  Target Ranges:  Prepandial:   less than 140 mg/dL      Peak postprandial:   less than 180 mg/dL (1-2 hours)      Critically ill patients:  140 - 180 mg/dL   Lab Results  Component Value Date   GLUCAP 233 (H) 12/25/2018   HGBA1C 8.1 (H) 12/24/2018    Review of Glycemic Control  Diabetes history: DM 2 Outpatient Diabetes medications: 75/25 50 units qam, 30 units qpm Current orders for Inpatient glycemic control:  Lantus 30 units qhs Novolog 0-9 units tid Novolog 0-5 units qhs  Renal WNL A1c: 7.6% on 8/1 8.1% on 9/8 Patient on steroids beginning of August  Steroids: Solumedrol 40 mg Q12 hours  Inpatient Diabetes Program Recommendations:    Consider Novolog 4 units tid meal coverage if consuming at least 50% of meals.  Will follow glucose trends. May need more basal insulin if fasting glucose is still elevated tomorrow.  Thanks,  Tama Headings RN, MSN, BC-ADM Inpatient Diabetes Coordinator Team Pager [phone] (8a-5p)

## 2018-12-25 NOTE — Progress Notes (Signed)
Occupational Therapy Treatment Patient Details Name: Kathy Irwin MRN: [id number] DOB: 1951/02/20 Today's Date: 12/25/2018    History of present illness Pt is a 68 y/o female with PMH of recent COVID-56 infection (discharged on 3LNC), HTN, DM2, morbid obesity, ILD  presented to ED 9/7 with SOB and DOE. (currently covid -)   OT comments  PTA patient reports living alone, completing ADLs/IADls, mobility with independence. Reports using supplemental O2 via [place] during "exertion" only.  She was admitted for above and limited by problem list below, including decreased activity tolerance and decreased safety awareness, problem sovling.  She demonstrates ability to complete grooming with supervision standing, min assist for LB ADLs and supervision for transfers/ short distance in room mobility.  On RA upon entry saturating at 86%, donned O2 at 3L and saturation increased to 93%, during activity to sink/grooming O2 decreased to 79% and only increased to 86% during standing rest therefore therapist bumped O2 upto 4L for recovery to 90%, pt immedately asked therapist to bump O2 back down to 3L and was able to sustain saturation at 90%; 91-92% 3L resting in recliner upon exit. Initiated education on energy conservation and importance of O2 to maintain saturations. Patient will benefit from continued OT services while admitted and after dc at [facility] level in order to maximize independence, safety with ADls and mobility.    Follow Up Recommendations  Home health OT;Supervision - Intermittent(may progress to no follow up needed)    Equipment Recommendations  None recommended by OT    Recommendations for Other Services PT consult    Precautions / Restrictions Precautions Precautions: Fall Restrictions Weight Bearing Restrictions: No       Mobility Bed Mobility               General bed mobility comments: OOB in chair upon entry   Transfers Overall transfer level: Needs assistance Equipment used:  None Transfers: Sit to/from Stand Sit to Stand: Supervision         General transfer comment: for safety, no physical assist required    Balance Overall balance assessment: Mild deficits observed, not formally tested                                         ADL either performed or assessed with clinical judgement   ADL Overall ADL's : Needs assistance/impaired     Grooming: Supervision/safety;Wash/dry hands;Oral care;Standing   Upper Body Bathing: Set up;Sitting   Lower Body Bathing: Minimal assistance;Sit to/from stand Lower Body Bathing Details (indicate cue type and reason): decreased reach to BLEs Upper Body Dressing : Set up;Sitting   Lower Body Dressing: Minimal assistance;Sit to/from stand Lower Body Dressing Details (indicate cue type and reason): decreased reach to B LEs, supervision sit<>stand Toilet Transfer: Supervision/safety;Ambulation           Functional mobility during ADLs: Supervision/safety General ADL Comments: pt limited by decreased activity tolerance and endurance     Vision       Perception     Praxis      Cognition Arousal/Alertness: Awake/alert Behavior During Therapy: WFL for tasks assessed/performed Overall Cognitive Status: Impaired/Different from baseline Area of Impairment: Problem solving;Safety/judgement;Awareness                         Safety/Judgement: Decreased awareness of safety;Decreased awareness of deficits Awareness: Emergent Problem Solving: Slow processing;Requires verbal cues General  Comments: poor awareness and safety        Exercises     Shoulder Instructions       General Comments pt on RA upon entry O2 86%, donned O2 at 3L saturation increased to 93%, during activity to sink/grooming O2 decreased to 79% and only increased to 86% during standing rest therefore therapist bumped O2 upto 4L for recovery to 90%, pt immedately asked therapist to bump O2 back down to 3L and was  able to sustain saturation at 90%; 91-92% 3L resting in recliner upon exit     Pertinent Vitals/ Pain       Pain Assessment: No/denies pain  Home Living Family/patient expects to be discharged to:: Private residence Living Arrangements: Alone Available Help at Discharge: Available PRN/intermittently;Family Type of Home: House Home Access: Stairs to enter Technical brewer of Steps: 3   Home Layout: One level     Bathroom Shower/Tub: Occupational psychologist: Standard     Home Equipment: Shower seat(home O2)   Additional Comments: initally reports not wearing O2 at home, then reports wearing it during exertion but not all the time--unsure how much she actually wears O2      Prior Functioning/Environment Level of Independence: Independent        Comments: ADLs, IADLs, mobility independently    Frequency  Min 2X/week        Progress Toward Goals  OT Goals(current goals can now be found in the care plan section)     Acute Rehab OT Goals Patient Stated Goal: to feel better OT Goal Formulation: With patient Time For Goal Achievement: 01/08/19 Potential to Achieve Goals: Good  Plan      Co-evaluation                 AM-PAC OT "6 Clicks" Daily Activity     Outcome Measure   Help from another person eating meals?: None Help from another person taking care of personal grooming?: A Little Help from another person toileting, which includes using toliet, bedpan, or urinal?: A Little Help from another person bathing (including washing, rinsing, drying)?: A Little Help from another person to put on and taking off regular upper body clothing?: A Little Help from another person to put on and taking off regular lower body clothing?: A Little 6 Click Score: 19    End of Session Equipment Utilized During Treatment: Oxygen(3-4L)  OT Visit Diagnosis: Other abnormalities of gait and mobility (R26.89);Muscle weakness (generalized) (M62.81)   Activity  Tolerance Patient tolerated treatment well   Patient Left in chair;with call bell/phone within reach   Nurse Communication Mobility status        Time: 6433-2951 OT Time Calculation (min): 22 min  Charges: OT General Charges $OT Visit: 1 Visit OT Evaluation $OT Eval Moderate Complexity: [place], OT Acute Rehabilitation Services Pager [phone] Office [phone]    Delight Stare 12/25/2018, 3:35 PM

## 2018-12-25 NOTE — Evaluation (Signed)
Physical Therapy Evaluation Patient Details Name: Kathy Irwin MRN: [id number] DOB: [birth year] Today's Date: 12/25/2018   History of Present Illness  Pt is a 68 y/o female with PMH of recent COVID-19 infection (discharged on 3LNC), HTN, DM2, morbid obesity, ILD  presented to ED 9/7 with SOB and DOE. (currently covid -)  Clinical Impression  PTA reports she was living alone in single story home with 3 steps to enter. Pt reports independence in ADLs and children assist with driving for grocery shopping and doctors appointments. Pt states she is able to do most things but is very tired and requires resting with ambulation. Pt also reports intermittent supplemental O2 use. Pt able to teach back that SaO2 should be >88%O2 however reports she does not always use it and sometimes it drops to 45%O2. Pt educated on brain's need for O2. Pt is currently supervision for transfers and ambulation of 200 feet with RW. Pt limited in ambulation by oxygen desaturation (see General Comment). PT recommending HHPT at discharge for improving endurance and energy conservation. PT will continue to follow acutely.    Follow Up Recommendations Home health PT;Supervision - Intermittent    Equipment Recommendations  Other (comment)(Rollator )       Precautions / Restrictions Precautions Precautions: Fall Restrictions Weight Bearing Restrictions: No      Mobility  Bed Mobility               General bed mobility comments: OOB in chair upon entry   Transfers Overall transfer level: Needs assistance Equipment used: None Transfers: Sit to/from Stand Sit to Stand: Supervision         General transfer comment: for safety, no physical assist required  Ambulation/Gait Ambulation/Gait assistance: Supervision Gait Distance (Feet): 200 Feet(1x standing rest break) Assistive device: Rolling walker (2 wheeled) Gait Pattern/deviations: Step-through pattern;Decreased step length - right;Decreased step length -  left;Trunk flexed Gait velocity: slowed Gait velocity interpretation: <1.8 ft/sec, indicate of risk for recurrent falls General Gait Details: supervision for safety with ambulation, slow, generally steady gait, vc for management of RW      Balance Overall balance assessment: Mild deficits observed, not formally tested                                           Pertinent Vitals/Pain Pain Assessment: No/denies pain    Home Living Family/patient expects to be discharged to:: Private residence Living Arrangements: Alone Available Help at Discharge: Available PRN/intermittently;Family Type of Home: House Home Access: Stairs to enter   Technical brewer of Steps: 3 Home Layout: One level Home Equipment: Shower seat(home O2) Additional Comments: initally reports not wearing O2 at home, then reports wearing it during exertion but not all the time--unsure how much she actually wears O2    Prior Function Level of Independence: Independent         Comments: ADLs, IADLs, mobility independently      Hand Dominance   Dominant Hand: Right    Extremity/Trunk Assessment   Upper Extremity Assessment Upper Extremity Assessment: Defer to OT evaluation    Lower Extremity Assessment Lower Extremity Assessment: Overall WFL for tasks assessed       Communication   Communication: No difficulties  Cognition Arousal/Alertness: Awake/alert Behavior During Therapy: WFL for tasks assessed/performed Overall Cognitive Status: Impaired/Different from baseline Area of Impairment: Problem solving;Safety/judgement;Awareness  Safety/Judgement: Decreased awareness of safety;Decreased awareness of deficits Awareness: Emergent Problem Solving: Slow processing;Requires verbal cues General Comments: poor awareness and safety      General Comments General comments (skin integrity, edema, etc.): pt with intermittent tremor that she states  she has had since her COVID hospitalization, at rest 3 L O2 via [place] with SaO2 96%O2  with ambulation on 3 L SaO2 dropped to 83%O2, placed on 4L SaO2 rebounded to 87%O2 increased to 6L SaO2 and O2 rebounded to 95%O2 with ambulation on 4L SaO2 dropped to 85%O2 but unable to quickly rebound with pursed lipped breathing, increased to 6L ambulated back to room with SaO2 (1%O2, once in recliner decreased O2 to 3L and pt able to maintain 91-92%O2, pt noted to have 1+ pitting edema in bilateral LE,     Exercises General Exercises - Lower Extremity Ankle Circles/Pumps: AROM;Both;20 reps;Seated   Assessment/Plan    PT Assessment Patient needs continued PT services  PT Problem List Decreased activity tolerance;Decreased mobility;Cardiopulmonary status limiting activity;Decreased safety awareness       PT Treatment Interventions DME instruction;Gait training;Stair training;Functional mobility training;Therapeutic activities;Therapeutic exercise;Balance training;Cognitive remediation;Patient/family education    PT Goals (Current goals can be found in the Care Plan section)  Acute Rehab PT Goals Patient Stated Goal: to feel better PT Goal Formulation: With patient Time For Goal Achievement: 01/08/19 Potential to Achieve Goals: Fair    Frequency Min 3X/week   Barriers to discharge Decreased caregiver support         AM-PAC PT "6 Clicks" Mobility  Outcome Measure Help needed turning from your back to your side while in a flat bed without using bedrails?: None Help needed moving from lying on your back to sitting on the side of a flat bed without using bedrails?: None Help needed moving to and from a bed to a chair (including a wheelchair)?: None Help needed standing up from a chair using your arms (e.g., wheelchair or bedside chair)?: None Help needed to walk in hospital room?: None Help needed climbing 3-5 steps with a railing? : A Little 6 Click Score: 23    End of Session Equipment Utilized  During Treatment: Gait belt;Oxygen Activity Tolerance: Patient tolerated treatment well Patient left: in chair;with call bell/phone within reach Nurse Communication: Mobility status PT Visit Diagnosis: Unsteadiness on feet (R26.81);Other abnormalities of gait and mobility (R26.89);Muscle weakness (generalized) (M62.81);Difficulty in walking, not elsewhere classified (R26.2)    Time: 4709-2957 PT Time Calculation (min) (ACUTE ONLY): 33 min   Charges:   PT Evaluation $PT Eval Moderate Complexity: 1 Mod PT Treatments $Gait Training: 8-22 mins        Braeley Buskey B. Migdalia Dk PT, DPT Acute Rehabilitation Services Pager [phone] Office [phone]   [place] 12/25/2018, 5:09 PM

## 2018-12-25 NOTE — Progress Notes (Signed)
[facility] paged patient is requesting medication for sleep. Arthor Captain LPN

## 2018-12-25 NOTE — Progress Notes (Addendum)
PROGRESS NOTE   Kathy Irwin  RCV:[id number]    DOB: 1950-12-02    DOA: 12/23/2018  PCP: Kerin Perna, NP   I have briefly reviewed patients previous medical records in [facility].  Chief Complaint  Patient presents with  . Shortness of Breath  . Dizziness    Brief Narrative:  68 year old female, moved from [facility] to [place] in March 2020, lives alone, independent, PMH of reported ILD, extensively evaluated by pulmonology in [facility] chronic hypoxic respiratory failure on home oxygen 2 L/min at bedtime as needed, chronic DOE, DM 2, HTN, morbid obesity, recent COVID-19 infection approximately 6 weeks ago following which she was discharged on home oxygen 3 L/min continuously, presented 9/7 for worsening dyspnea, dizziness, cough and lower extremity edema.  Admitted for acute on chronic hypoxic respiratory failure due to COVID related ILD flare and acute diastolic CHF.  Pulmonology seen and signed off.  Slowly improving.   Assessment & Plan:   Principal Problem:   Acute respiratory failure with hypoxia (HCC) Active Problems:   DM (diabetes mellitus) (HCC)   Thrombocytopenia (HCC)   ILD (interstitial lung disease) (HCC)   Acute diastolic CHF (congestive heart failure) (HCC)   Obesity, Class III, BMI 40-49.9 (morbid obesity) ([place])   Acute on chronic respiratory failure with hypoxia  Suspected due to COVID induced ILD flare and acute diastolic CHF.  S/p COVID-19 viral pneumonitis, treated with steroids, rammed elsewhere, Actemra x2 and discharged on oxygen 3 L/min continuously.  Previously used 2 L/min at night as needed.  SARS coronavirus 2 testing negative on 9/7.  CTA chest without PE, aneurysm or dissection.  Hazy interstitial and airspace opacities throughout both lung fields, DD: Pulmonary edema, atypical multifocal pneumonia (felt less likely), ILD  PMH of steroid responsive ILD.  Pulmonology consulted and assisted with care.  Sniff test negative.   Iron/TIBC/B12/eosinophils normal.  IgE/folate pending.  RVP negative.  Continue Solu-Medrol 40 mg every 12 hourly and IV Lasix for additional 24 hours and then reassess to consider transitioning to oral and discharge.  Reassess home oxygen requirement prior to discharge.  Outpatient follow-up with pulmonology.  COVID-19 induced ILD flare  Management as noted above.  As per pulmonology follow-up, recommend steroid tapered to prednisone 40 mg daily x2 weeks then 20 mg daily x2 weeks at DC.  Has follow-up with [facility] pulmonology on 10/8 at 9:15 AM.  Acute diastolic CHF  TTE results as noted below.  Normal LVEF/60-65%.  Remains significantly volume overloaded despite IV Lasix 40 mg twice daily, hence increased IV Lasix to 60 mg every 12 hours, reassess in a.m.  Intake output appears inaccurate/+520 mL.  Weight also has gone up by about 4 pounds.  Strict intake output, strict daily weights and added lower extremity compression stockings to mobilize fluids.  Fluid restriction.  Uncontrolled type II DM with hyperglycemia in obese  Worsened by ongoing steroids.  A1c 8.1 suggesting poor OP control  Increase Lantus to 35 units at bedtime and change SSI to moderate sensitivity.  Monitor closely and adjust as needed.  Essential hypertension  Controlled on metoprolol 200 mg twice daily.  Hyperlipidemia  Continue statins  Morbid obesity/Body mass index is 45.64 kg/m.  Chronic thrombocytopenia  Stable without bleeding.  Also new leukopenia, unclear etiology.?  Acute illness.  Follow CBC.  Peripheral calcifications within the gallbladder  Noted on CTA chest.  Reported as may represent cholelithiasis, however gallbladder calcifications are also a consideration.  No GI symptoms.  RUQ ultrasound shows  calcification within the gallbladder wall with posterior shadowing limiting evaluation of the gallbladder lumen.  No pericholecystic fluid noted.  Overall appearance stable  compared to 2019.  Outpatient follow-up.  1.6 cm right thyroid hypoattenuated nodule  Noted on CTA chest.  Further evaluation with thyroid ultrasound may be considered as outpatient.  Aortic atherosclerosis  Continue statins.  Isolated hyperbilirubinemia  Improving.  May be related to acute illness.  RUQ ultrasound shows fatty liver.  DVT prophylaxis: SCDs Code Status: Full Family Communication: None at bedside Disposition: DC home pending clinical improvement, hopefully in the next 1 to 2 days   Consultants:  Pulmonology  Procedures:  TTE 12/24/2018: IMPRESSIONS    1. The left ventricle has normal systolic function with an ejection fraction of 60-65%. The cavity size was normal. There is mildly increased left ventricular wall thickness. Left ventricular diastolic Doppler parameters are consistent with impaired  relaxation. No evidence of left ventricular regional wall motion abnormalities.  2. There is mild mitral annular calcification present. No evidence of mitral valve stenosis. Trivial mitral regurgitation.  3. The aortic valve is tricuspid. Mild calcification of the aortic valve. Aortic valve regurgitation is trivial by color flow Doppler. Mild stenosis of the aortic valve. Mean gradient 13 mmHg, AVA 1.7 cm^2. Small mobile calcification versus Lambl's  excrescence seen on the aortic valve, less likely vegetation.  4. The right ventricle has normal systolic function. The cavity was normal. There is no increase in right ventricular wall thickness.  5. Normal IVC size. PA systolic pressure 44 mmHg.  6. The aortic root is normal in size and structure.  Antimicrobials:  None   Subjective: Overall patient feels better.  Dyspnea improved but breathing is not yet at baseline.  No chest pain.  Still has significant lower extremity swelling/edema.  No cough denies nausea, vomiting or abdominal pain.  Objective:  Vitals:   12/25/18 0142 12/25/18 0521 12/25/18 0747 12/25/18  1210  BP: 120/81 135/72 126/81 115/67  Pulse: 72 66 68 72  Resp: _0 Temp: 98.6 F (37 C) 97.9 F (36.6 C) 98.1 F (36.7 C) 99.6 F (37.6 C)  TempSrc: Oral Oral Oral Oral  SpO2: 100% 100% 99% 93%  Weight:  120.6 kg    Height:        Examination:  General exam: Pleasant middle-aged female, moderately built and morbidly obese sitting up comfortably in chair without distress. Respiratory system: Few fine bibasilar crackles.  Rest of lung fields clear to auscultation without wheezing, rhonchi.  No increased work of breathing. Cardiovascular system: S1 & S2 heard, RRR. No JVD, murmurs, rubs, gallops or clicks.  2+ pitting bilateral leg edema extending to knees.  Telemetry personally reviewed: Sinus rhythm. Gastrointestinal system: Abdomen is nondistended, soft and nontender. No organomegaly or masses felt. Normal bowel sounds heard. Central nervous system: Alert and oriented. No focal neurological deficits. Extremities: Symmetric 5 x 5 power. Skin: No rashes, lesions or ulcers Psychiatry: Judgement and insight appear normal. Mood & affect appropriate.     Data Reviewed: I have personally reviewed following labs and imaging studies  CBC: Recent Labs  Lab 12/23/18 1446 12/23/18 2218 12/24/18 0340 12/24/18 1453 12/25/18 0434  WBC 5.0  --  5.3 3.4* 3.1*  NEUTROABS  --   --   --  2.7  --   HGB 12.0 13.3 12.3 12.2 12.1  HCT 37.7 39.0 38.3 38.0 37.5  MCV 100.0  --  98.0 98.4 96.6  PLT 87*  --  86*  80* 89*   Basic Metabolic Panel: Recent Labs  Lab 12/23/18 1446 12/23/18 2218 12/24/18 0340 12/25/18 0434  NA 140 141 138 136  K 3.9 4.1 3.8 4.4  CL 102  --  100 99  CO2 29  --  28 28  GLUCOSE 175*  --  222* 248*  BUN 7*  --  7* 13  CREATININE 0.64  --  0.65 0.72  CALCIUM 9.9  --  9.7 10.0  MG  --   --  1.9 2.0  PHOS  --   --  3.2 2.8   Liver Function Tests: Recent Labs  Lab 12/23/18 2209 12/24/18 0340 12/25/18 0434  AST _0 ALT _1 ALKPHOS 55  58 56  BILITOT 1.8* 1.9* 1.4*  PROT 7.1 7.2 7.1  ALBUMIN 3.8 4.1 3.8    Cardiac Enzymes: No results for input(s): CKTOTAL, CKMB, CKMBINDEX, TROPONINI in the last 168 hours.  CBG: Recent Labs  Lab 12/24/18 1220 12/24/18 1703 12/24/18 2058 12/25/18 0656 12/25/18 1207  GLUCAP 144* 237* 316* 233* 285*    Recent Results (from the past 240 hour(s))  SARS Coronavirus 2 [facility] order, Performed in [facility] hospital lab) Nasopharyngeal Nasopharyngeal Swab     Status: None   Collection Time: 12/23/18  9:49 PM   Specimen: Nasopharyngeal Swab  Result Value Ref Range Status   SARS Coronavirus 2 NEGATIVE NEGATIVE Final    Comment: (NOTE) If result is NEGATIVE SARS-CoV-2 target nucleic acids are NOT DETECTED. The SARS-CoV-2 RNA is generally detectable in upper and lower  respiratory specimens during the acute phase of infection. The lowest  concentration of SARS-CoV-2 viral copies this assay can detect is 250  copies / mL. A negative result does not preclude SARS-CoV-2 infection  and should not be used as the sole basis for treatment or other  patient management decisions.  A negative result may occur with  improper specimen collection / handling, submission of specimen other  than nasopharyngeal swab, presence of viral mutation(s) within the  areas targeted by this assay, and inadequate number of viral copies  (<250 copies / mL). A negative result must be combined with clinical  observations, patient history, and epidemiological information. If result is POSITIVE SARS-CoV-2 target nucleic acids are DETECTED. The SARS-CoV-2 RNA is generally detectable in upper and lower  respiratory specimens dur ing the acute phase of infection.  Positive  results are indicative of active infection with SARS-CoV-2.  Clinical  correlation with patient history and other diagnostic information is  necessary to determine patient infection status.  Positive results do  not rule out bacterial  infection or co-infection with other viruses. If result is PRESUMPTIVE POSTIVE SARS-CoV-2 nucleic acids MAY BE PRESENT.   A presumptive positive result was obtained on the submitted specimen  and confirmed on repeat testing.  While 2019 novel coronavirus  (SARS-CoV-2) nucleic acids may be present in the submitted sample  additional confirmatory testing may be necessary for epidemiological  and / or clinical management purposes  to differentiate between  SARS-CoV-2 and other Sarbecovirus currently known to infect humans.  If clinically indicated additional testing with an alternate test  methodology [id number]) is advised. The SARS-CoV-2 RNA is generally  detectable in upper and lower respiratory sp ecimens during the acute  phase of infection. The expected result is Negative. Fact Sheet for Patients:  StrictlyIdeas.no Fact Sheet for Healthcare Providers: BankingDealers.co.za This test is not yet approved or cleared by the [place] FDA and  has been authorized for detection and/or diagnosis of SARS-CoV-2 by FDA under an Emergency Use Authorization (EUA).  This EUA will remain in effect (meaning this test can be used) for the duration of the COVID-19 declaration under Section 564(b)(1) of the Act, 21 U.S.C. section 360bbb-3(b)(1), unless the authorization is terminated or revoked sooner. Performed at [facility], [place] [address], [place], [place] [address]   Respiratory Panel by PCR     Status: None   Collection Time: 12/24/18  2:45 PM  Result Value Ref Range Status   Adenovirus NOT DETECTED NOT DETECTED Final   Coronavirus 229E NOT DETECTED NOT DETECTED Final    Comment: (NOTE) The Coronavirus on the Respiratory Panel, DOES NOT test for the novel  Coronavirus (2019 nCoV)    Coronavirus HKU1 NOT DETECTED NOT DETECTED Final   Coronavirus NL63 NOT DETECTED NOT DETECTED Final   Coronavirus OC43 NOT DETECTED NOT DETECTED Final    Metapneumovirus NOT DETECTED NOT DETECTED Final   Rhinovirus / Enterovirus NOT DETECTED NOT DETECTED Final   Influenza A NOT DETECTED NOT DETECTED Final   Influenza B NOT DETECTED NOT DETECTED Final   Parainfluenza Virus 1 NOT DETECTED NOT DETECTED Final   Parainfluenza Virus 2 NOT DETECTED NOT DETECTED Final   Parainfluenza Virus 3 NOT DETECTED NOT DETECTED Final   Parainfluenza Virus 4 NOT DETECTED NOT DETECTED Final   Respiratory Syncytial Virus NOT DETECTED NOT DETECTED Final   Bordetella pertussis NOT DETECTED NOT DETECTED Final   Chlamydophila pneumoniae NOT DETECTED NOT DETECTED Final   Mycoplasma pneumoniae NOT DETECTED NOT DETECTED Final    Comment: Performed at [facility], [place]. [address], [place],  [address]         Radiology Studies: Ct Angio Chest Pe W And/or Wo Contrast  Result Date: 12/23/2018 CLINICAL DATA:  Clinically suspected pulmonary embolus. EXAM: CT ANGIOGRAPHY CHEST WITH CONTRAST TECHNIQUE: Multidetector CT imaging of the chest was performed using the standard protocol during bolus administration of intravenous contrast. Multiplanar CT image reconstructions and MIPs were obtained to evaluate the vascular anatomy. CONTRAST:  114m OMNIPAQUE IOHEXOL 350 MG/ML SOLN COMPARISON:  December 17, 2017 FINDINGS: Cardiovascular: Preferential opacification of the thoracic aorta. No evidence of thoracic aortic aneurysm or dissection. Normal heart size. No pericardial effusion. No central pulmonary embolus. The segmental and subsegmental branches are suboptimally visualized. Mediastinum/Nodes: 1.6 cm right thyroid hypoattenuated nodule. No evidence of lymphadenopathy. Normal appearance of the trachea and esophagus. Lungs/Pleura: Mild chronic interstitial lung changes. Low lung volumes and breathing motion artifact. Hazy interstitial and airspace opacities throughout both lungs with mosaic distribution and dependent predominance. Upper Abdomen: Peripheral calcifications  within the gallbladder. No evidence of biliary ductal dilation. Musculoskeletal: No chest wall abnormality. No acute or significant osseous findings. Review of the MIP images confirms the above findings. IMPRESSION: 1. No evidence of thoracic aortic aneurysm or dissection. 2. No evidence of central pulmonary embolus. The segmental and subsegmental branches are suboptimally visualized. 3. Hazy interstitial and airspace opacities throughout both lungs with mosaic distribution and dependent predominance. Differential diagnosis includes pulmonary edema, atypical multifocal pneumonia, or interstitial lung disease. 4. Peripheral calcifications within the gallbladder. This may represent cholelithiasis, however gallbladder calcifications are also a consideration. Further evaluation with right upper quadrant ultrasound or nonemergent abdominal MRI may be considered. 5. 1.6 cm right thyroid hypoattenuated nodule. Further evaluation with thyroid ultrasound may be considered. Aortic Atherosclerosis (ICD10-I70.0). Electronically Signed   By: DFidela SalisburyM.D.   On: 12/23/2018 19:05  Dg Sniff Test  Result Date: 12/25/2018 CLINICAL DATA:  Shortness of breath.  Elevated hemidiaphragm on CT. EXAM: CHEST FLUOROSCOPY TECHNIQUE: Real-time fluoroscopic evaluation of the chest was performed. FLUOROSCOPY TIME:  Fluoroscopy Time: 24 seconds of low-dose pulsed fluoroscopy. Radiation Exposure Index (if provided by the fluoroscopic device): 8.4 mGy Number of Acquired Spot Images: 0 COMPARISON:  Chest CT 12/23/2018. Radiographs 12/23/2018 and 12/17/2018. FINDINGS: AP and lateral fluoroscopic assessment of the diaphragm was performed with the patient erect during normal breathing and forced sniffing. Fluoro store cine loops were recorded. Both hemidiaphragms move normally. No paradoxical motion identified. IMPRESSION: Normal sniff test.  No evidence of hemidiaphragm paralysis. Electronically Signed   By: Richardean Sale M.D.   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 Considerable posterior acoustical shadowing is noted and evaluation of the lumen of the gallbladder is not possible. Common bile duct: Diameter: 3.5 mm Liver: Mildly increased echogenicity likely related to fatty infiltration. No focal mass is noted. Portal vein is patent on color Doppler imaging with normal direction of blood flow towards the liver. Other: None. IMPRESSION: Calcification within the gallbladder wall with posterior shadowing limiting evaluation of the gallbladder lumen. No pericholecystic fluid is noted. The overall appearance is stable dating back to 2019. Fatty liver Electronically Signed   By: Inez Catalina M.D.   On: 12/25/2018 07:12        Scheduled Meds: . atorvastatin  20 mg Oral QHS  . FLUoxetine  20 mg Oral Daily  . furosemide  40 mg Intravenous BID  . gabapentin  300 mg Oral BID  . insulin aspart  0-5 Units Subcutaneous QHS  . insulin aspart  0-9 Units Subcutaneous TID WC  . insulin glargine  30 Units Subcutaneous QHS  . methylPREDNISolone (SOLU-MEDROL) injection  40 mg Intravenous Q12H  . metoprolol  200 mg Oral BID  . pantoprazole  40 mg Oral Daily  . sodium chloride flush  3 mL Intravenous Once  . sodium chloride flush  3 mL Intravenous Q12H   Continuous Infusions: . sodium chloride       LOS: 2 days     Vernell Leep, MD, FACP, [facility]. [facility]  To contact the attending provider between 7A-7P or the covering provider during after hours 7P-7A, please log into the web site [email or web address] and access using universal [place] password for that web site. If you do not have the password, please call the hospital  operator.  12/25/2018, 3:31 PM

## 2018-12-25 NOTE — Progress Notes (Signed)
Patient refused standing weight this morning.  Bed weight completed.

## 2018-12-26 LAB — COMPREHENSIVE METABOLIC PANEL
ALT: 14 U/L (ref 0–44)
AST: 16 U/L (ref 15–41)
Albumin: 3.9 g/dL (ref 3.5–5.0)
Alkaline Phosphatase: 57 U/L (ref 38–126)
Anion gap: 11 (ref 5–15)
BUN: 17 mg/dL (ref 8–23)
CO2: 29 mmol/L (ref 22–32)
Calcium: 10.2 mg/dL (ref 8.9–10.3)
Chloride: 98 mmol/L (ref 98–111)
Creatinine, Ser: 0.85 mg/dL (ref 0.44–1.00)
GFR calc Af Amer: >60 mL/min (ref >60)
GFR calc non Af Amer: >60 mL/min (ref >60)
Glucose, Bld: 226 mg/dL — ABNORMAL HIGH (ref 70–99)
Potassium: 4.3 mmol/L (ref 3.5–5.1)
Sodium: 138 mmol/L (ref 135–145)
Total Bilirubin: 1.5 mg/dL — ABNORMAL HIGH (ref 0.3–1.2)
Total Protein: 7.1 g/dL (ref 6.5–8.1)

## 2018-12-26 LAB — CBC
HCT: 39 % (ref 36.0–46.0)
Hemoglobin: 12.5 g/dL (ref 12.0–15.0)
MCH: 31 pg (ref 26.0–34.0)
MCHC: 32.1 g/dL (ref 30.0–36.0)
MCV: 96.8 fL (ref 80.0–100.0)
Platelets: 101 10*3/uL — ABNORMAL LOW (ref 150–400)
RBC: 4.03 MIL/uL (ref 3.87–5.11)
RDW: 19.9 % — ABNORMAL HIGH (ref 11.5–15.5)
WBC: 3.7 10*3/uL — ABNORMAL LOW (ref 4.0–10.5)
nRBC: 1.6 % — ABNORMAL HIGH (ref 0.0–0.2)

## 2018-12-26 LAB — GLUCOSE, CAPILLARY
Glucose-Capillary: 199 mg/dL — ABNORMAL HIGH (ref 70–99)
Glucose-Capillary: 215 mg/dL — ABNORMAL HIGH (ref 70–99)

## 2018-12-26 LAB — IGE: IgE (Immunoglobulin E), Serum: 8 IU/mL (ref 6–495)

## 2018-12-26 MED ORDER — FUROSEMIDE 40 MG PO TABS: 40.0000 mg | ORAL_TABLET | Freq: Every day | ORAL | 0 refills | Status: DC

## 2018-12-26 MED ORDER — OMEPRAZOLE 20 MG PO CPDR: 20.0000 mg | DELAYED_RELEASE_CAPSULE | Freq: Every day | ORAL | 0 refills | Status: DC

## 2018-12-26 MED ORDER — PREDNISONE 20 MG PO TABS: ORAL_TABLET | ORAL | 0 refills | Status: DC

## 2018-12-26 NOTE — Discharge Summary (Signed)
Physician Discharge Summary  Kathy Irwin ERD:[id number] DOB: 1950-11-23  PCP: Kerin Perna, NP  Admitted from: Home Discharged to: Home  Admit date: 12/23/2018 Discharge date: 12/26/2018  Recommendations for Outpatient Follow-up:   Follow-up Information    [facility]. Go on 01/23/2019.   Specialty: Pulmonology Why: Follow up with Dr Tamala Julian @ 9:15 am. Please contact office if you need to reschedule  Contact information: [place] [id number] [place], [place], NP. Go on 01/09/2019.   Specialty: Internal Medicine Why: _0 :30pm.  To be seen with repeat labs (CBC & BMP).  Recommend outpatient follow-up and evaluation of thyroid nodule as deemed necessary. Contact information: [address] [place] [address] [phone]        Home, [facility] Follow up.   Specialty: Home Health Services Why: RN, PT, OT Contact information: [address] [place] [address] [phone]            Home Health: PT, OT and RN Equipment/Devices: Rolling walker  Discharge Condition: Improved and stable CODE STATUS: Full Diet recommendation: Heart healthy & diabetic diet  Discharge Diagnoses:  Principal Problem:   Acute respiratory failure with hypoxia ([place]) Active Problems:   DM (diabetes mellitus) ([place])   Thrombocytopenia (HCC)   ILD (interstitial lung disease) ([place])   Acute diastolic CHF (congestive heart failure) (HCC)   Obesity, Class III, BMI 40-49.9 (morbid obesity) ([place])   Brief Summary: 68 year old female, moved from [place] to [place] in March 2020, lives alone, independent, PMH of reported ILD, extensively evaluated by pulmonology in [facility], chronic hypoxic respiratory failure on home oxygen 2 L/min at bedtime as needed, chronic DOE, DM 2, HTN, morbid obesity, recent COVID-19 infection approximately 6 weeks ago following which she was discharged on home oxygen 3 L/min  continuously, presented 9/7 for worsening dyspnea, dizziness, cough and lower extremity edema.  Admitted for acute on chronic hypoxic respiratory failure due to COVID related ILD flare and acute diastolic CHF.  Pulmonology consulted.   Assessment & Plan:  Acute on chronic respiratory failure with hypoxia  Suspected due to COVID induced ILD flare and acute diastolic CHF.  S/p COVID-19 viral pneumonitis, treated with steroids, Ramdesvir, Actemra x2 and discharged on oxygen 3 L/min continuously.    Prior to that she used 2 L/min at night as needed.  SARS coronavirus 2 testing negative on 9/7.  CTA chest without PE, aneurysm or dissection.  Hazy interstitial and airspace opacities throughout both lung fields, DD: Pulmonary edema, atypical multifocal pneumonia (felt less likely), ILD  PMH of steroid responsive ILD.  Pulmonology consulted and assisted with care.  Sniff test negative.  Iron/TIBC/B12/eosinophils normal.  Folate normal.  IgE pending. RVP negative.  Treated in the hospital with IV Solu-Medrol 40 mg every 12 hourly, IV Lasix which was increased from 40 mg to 60 mg every 12 hourly.  Clinically improved.  Transitioned to oral prednisone taper as recommended by pulmonology (40 mg daily x2 weeks, then 20 mg daily x2 weeks, then 10 mg daily x2 weeks).  She has a follow-up appointment with pulmonology on 10/8 and decision regarding continued prednisone or further tapering can be made at that time.  I advised patient that she should not abruptly stop her prednisone which can have serious side effects and she verbalized understanding.  Patient qualified for home oxygen which was arranged by case management.  COVID-19 induced ILD flare  Management as  noted above.  As per pulmonology follow-up, recommend steroid tapered to prednisone 40 mg daily x2 weeks then 20 mg daily x2 weeks at DC.  Has follow-up with [place] on 10/8 at 9:15 AM.   Improved.  Acute diastolic  CHF  TTE results as noted below.  Normal LVEF/60-65%.  Treated with IV diuretics as noted above.  -2 L thus far but intake and output does not seem accurate.  Weight management also not accurate, no weight loss per charting.  Patient will be transitioned to oral Lasix 40 mg daily at discharge.  She has been extensively counseled regarding salt restriction, fluid restriction of 1500 mL/day, medication compliance and close MD follow-ups.  Uncontrolled type II DM with hyperglycemia in obese  Worsened by ongoing steroids.  A1c 8.1 suggesting poor OP control  In the hospital she was treated with Lantus 35 units at bedtime and SSI.  However this may still be lower than her prior home dose of 70/25 insulins and in the context of ongoing steroids has hyperglycemia.  Resume prior home dose of insulins at discharge.  She has been counseled to monitor her CBGs closely, record them and follow-up with PCP to adjust insulins as needed.  Essential hypertension  Controlled on metoprolol 200 mg twice daily.  Hyperlipidemia  Continue statins  Morbid obesity/Body mass index is 45.64 kg/m.  Chronic thrombocytopenia  Stable without bleeding.  Also new leukopenia, unclear etiology but WBC also up to 3.7, improving.?  Acute illness.  Better today with platelet count of 101.  Peripheral calcifications within the gallbladder  Noted on CTA chest.  Reported as may represent cholelithiasis, however gallbladder calcifications are also a consideration.  No GI symptoms.  RUQ ultrasound shows calcification within the gallbladder wall with posterior shadowing limiting evaluation of the gallbladder lumen.  No pericholecystic fluid noted.  Overall appearance stable compared to 2019.  Outpatient follow-up.  1.6 cm right thyroid hypoattenuated nodule  Noted on CTA chest.  Further evaluation with thyroid ultrasound may be considered as outpatient.  Aortic atherosclerosis  Continue  statins.  Isolated hyperbilirubinemia  Improving.  May be related to acute illness.  RUQ ultrasound shows fatty liver.   Consultants:  Pulmonology  Procedures:  TTE 12/24/2018: IMPRESSIONS   1. The left ventricle has normal systolic function with an ejection fraction of 60-65%. The cavity size was normal. There is mildly increased left ventricular wall thickness. Left ventricular diastolic Doppler parameters are consistent with impaired  relaxation. No evidence of left ventricular regional wall motion abnormalities. 2. There is mild mitral annular calcification present. No evidence of mitral valve stenosis. Trivial mitral regurgitation. 3. The aortic valve is tricuspid. Mild calcification of the aortic valve. Aortic valve regurgitation is trivial by color flow Doppler. Mild stenosis of the aortic valve. Mean gradient 13 mmHg, AVA 1.7 cm^2. Small mobile calcification versus Lambl's  excrescence seen on the aortic valve, less likely vegetation. 4. The right ventricle has normal systolic function. The cavity was normal. There is no increase in right ventricular wall thickness. 5. Normal IVC size. PA systolic pressure 44 mmHg. 6. The aortic root is normal in size and structure.  Discharge Instructions  Discharge Instructions    (HEART FAILURE PATIENTS) Call MD:  Anytime you have any of the following symptoms: 1) 3 pound weight gain in 24 hours or 5 pounds in 1 week 2) shortness of breath, with or without a dry hacking cough 3) swelling in the hands, feet or stomach 4) if you have to sleep  on extra pillows at night in order to breathe.   Complete by: As directed    Call MD for:  difficulty breathing, headache or visual disturbances   Complete by: As directed    Call MD for:  extreme fatigue   Complete by: As directed    Call MD for:  persistant dizziness or light-headedness   Complete by: As directed    Call MD for:  persistant nausea and vomiting   Complete by: As directed     Call MD for:  severe uncontrolled pain   Complete by: As directed    Call MD for:  temperature >100.4   Complete by: As directed    Diet - low sodium heart healthy   Complete by: As directed    Diet Carb Modified   Complete by: As directed    Increase activity slowly   Complete by: As directed        Medication List    TAKE these medications   acetaminophen 500 MG tablet Commonly known as: TYLENOL Take 1,000 mg by mouth 2 (two) times daily as needed (for pain).   albuterol 108 (90 Base) MCG/ACT inhaler Commonly known as: ProAir HFA Inhale 2 puffs into the lungs every 6 (six) hours as needed for wheezing or shortness of breath.   albuterol (2.5 MG/3ML) 0.083% nebulizer solution Commonly known as: PROVENTIL Take 3 mLs (2.5 mg total) by nebulization every 6 (six) hours as needed for wheezing or shortness of breath.   atorvastatin 20 MG tablet Commonly known as: LIPITOR Take 1 tablet (20 mg total) by mouth at bedtime.   blood glucose meter kit and supplies Kit Dispense based on patient and insurance preference. Use up to four times daily as directed. (FOR ICD-9 250.00, 250.01). For QAC - HS accuchecks.   diphenhydrAMINE 50 MG tablet Commonly known as: BENADRYL Take 0.5 tablets (25 mg total) by mouth at bedtime as needed for itching.   DYMISTA NA Place 1-2 sprays into both nostrils daily as needed (for allergies).   FLUoxetine 20 MG tablet Commonly known as: PROZAC TAKE 1 TABLET BY MOUTH EVERY DAY   FREESTYLE LITE test strip Generic drug: glucose blood For glucose testing every before meals at bedtime. Diagnosis E 11.65  Can substitute to any accepted brand   furosemide 40 MG tablet Commonly known as: Lasix Take 1 tablet (40 mg total) by mouth daily.   gabapentin 300 MG capsule Commonly known as: NEURONTIN Take 300 mg by mouth 2 (two) times daily.   Insulin Lispro Prot & Lispro (75-25) 100 UNIT/ML Kwikpen Commonly known as: HumaLOG Mix 75/25 KwikPen Inject  40 Units into the skin See admin instructions. Inject 50 units into the skin before breakfast "and a second dose at bedtime 30 units   Melatonin 5 MG Caps Take 1 capsule by mouth daily as needed (For sleep).   metoprolol 200 MG 24 hr tablet Commonly known as: TOPROL-XL Take 1 tablet (200 mg total) by mouth 2 (two) times daily.   omeprazole 20 MG capsule Commonly known as: PRILOSEC Take 1 capsule (20 mg total) by mouth daily.   oxybutynin 10 MG 24 hr tablet Commonly known as: DITROPAN-XL Take 1 tablet (10 mg total) by mouth daily as needed (for symptoms of bladder instability).   predniSONE 20 MG tablet Commonly known as: DELTASONE Take 2 tablets daily (total 40 mg) for 2 weeks,  then 1 tablet daily (total 44m) for 2 weeks then half tablet daily (10 mg total) for 2  weeks.   VITAMIN D PO Take 1 tablet by mouth daily.      Allergies  Allergen Reactions  . Other Shortness Of Breath and Other (See Comments)    Newspaper ink =  new chest pain, also  . Iodine Other (See Comments)    "Was a long time ago" (Reaction??)  . Merbromin Other (See Comments)    Mercurochrome- "Was a long time ago" (Reaction??)      Procedures/Studies: Dg Chest 2 View  Result Date: 12/23/2018 CLINICAL DATA:  Shortness of breath EXAM: CHEST - 2 VIEW COMPARISON:  12/17/2018 FINDINGS: The heart size and mediastinal contours are stable. Minimal bibasilar opacities, improving compared to prior. No new focal airspace opacity. No pleural effusion or pneumothorax. The visualized skeletal structures are unremarkable. IMPRESSION: Improving bibasilar airspace opacities. Electronically Signed   By: Davina Poke M.D.   On: 12/23/2018 15:25   Ct Angio Chest Pe W And/or Wo Contrast  Result Date: 12/23/2018 CLINICAL DATA:  Clinically suspected pulmonary embolus. EXAM: CT ANGIOGRAPHY CHEST WITH CONTRAST TECHNIQUE: Multidetector CT imaging of the chest was performed using the standard protocol during bolus  administration of intravenous contrast. Multiplanar CT image reconstructions and MIPs were obtained to evaluate the vascular anatomy. CONTRAST:  118m OMNIPAQUE IOHEXOL 350 MG/ML SOLN COMPARISON:  December 17, 2017 FINDINGS: Cardiovascular: Preferential opacification of the thoracic aorta. No evidence of thoracic aortic aneurysm or dissection. Normal heart size. No pericardial effusion. No central pulmonary embolus. The segmental and subsegmental branches are suboptimally visualized. Mediastinum/Nodes: 1.6 cm right thyroid hypoattenuated nodule. No evidence of lymphadenopathy. Normal appearance of the trachea and esophagus. Lungs/Pleura: Mild chronic interstitial lung changes. Low lung volumes and breathing motion artifact. Hazy interstitial and airspace opacities throughout both lungs with mosaic distribution and dependent predominance. Upper Abdomen: Peripheral calcifications within the gallbladder. No evidence of biliary ductal dilation. Musculoskeletal: No chest wall abnormality. No acute or significant osseous findings. Review of the MIP images confirms the above findings. IMPRESSION: 1. No evidence of thoracic aortic aneurysm or dissection. 2. No evidence of central pulmonary embolus. The segmental and subsegmental branches are suboptimally visualized. 3. Hazy interstitial and airspace opacities throughout both lungs with mosaic distribution and dependent predominance. Differential diagnosis includes pulmonary edema, atypical multifocal pneumonia, or interstitial lung disease. 4. Peripheral calcifications within the gallbladder. This may represent cholelithiasis, however gallbladder calcifications are also a consideration. Further evaluation with right upper quadrant ultrasound or nonemergent abdominal MRI may be considered. 5. 1.6 cm right thyroid hypoattenuated nodule. Further evaluation with thyroid ultrasound may be considered. Aortic Atherosclerosis (ICD10-I70.0). Electronically Signed   By: DFidela SalisburyM.D.   On: 12/23/2018 19:05   Dg Sniff Test  Result Date: 12/25/2018 CLINICAL DATA:  Shortness of breath.  Elevated hemidiaphragm on CT. EXAM: CHEST FLUOROSCOPY TECHNIQUE: Real-time fluoroscopic evaluation of the chest was performed. FLUOROSCOPY TIME:  Fluoroscopy Time: 24 seconds of low-dose pulsed fluoroscopy. Radiation Exposure Index (if provided by the fluoroscopic device): 8.4 mGy Number of Acquired Spot Images: 0 COMPARISON:  Chest CT 12/23/2018. Radiographs 12/23/2018 and 12/17/2018. FINDINGS: AP and lateral fluoroscopic assessment of the diaphragm was performed with the patient erect during normal breathing and forced sniffing. Fluoro store cine loops were recorded. Both hemidiaphragms move normally. No paradoxical motion identified. IMPRESSION: Normal sniff test.  No evidence of hemidiaphragm paralysis. Electronically Signed   By: WRichardean SaleM.D.   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 Considerable posterior acoustical shadowing is noted and evaluation of the lumen of the gallbladder is not possible. Common bile duct: Diameter: 3.5 mm Liver: Mildly increased echogenicity likely related to fatty infiltration. No focal mass is noted. Portal vein is patent on color Doppler imaging with normal direction of blood flow towards the liver. Other: None. IMPRESSION: Calcification within the gallbladder wall with posterior shadowing limiting evaluation of the gallbladder lumen. No pericholecystic fluid is noted. The overall appearance is stable dating back to 2019. Fatty liver Electronically Signed   By: Inez Catalina M.D.   On: 12/25/2018 07:12      Subjective: Patient reports that  overall she feels much better.  Dyspnea significantly improved and breathing almost back to baseline.  Has been ambulating comfortably in the room without dyspnea, chest pain, dizziness or lightheadedness.  Denies any other complaints.  Leg edema also improving slowly.  Discharge Exam:  Vitals:   12/26/18 0410 12/26/18 1012 12/26/18 1030 12/26/18 1200  BP: 128/68  135/69 131/72  Pulse: 70  70 68  Resp: _0 Temp: 97.7 F (36.5 C)   98.2 F (36.8 C)  TempSrc: Oral   Oral  SpO2: 98%  96% 97%  Weight:  119.2 kg    Height:        General exam: Pleasant middle-aged female, moderately built and morbidly obese sitting up comfortably in bed without distress. Respiratory system:  Clear to auscultation.  No increased work of breathing. Cardiovascular system: S1 & S2 heard, RRR. No JVD, murmurs, rubs, gallops or clicks.  1+ and decreasing bilateral pitting leg edema. Telemetry personally reviewed: Sinus rhythm. Gastrointestinal system: Abdomen is nondistended, soft and nontender. No organomegaly or masses felt. Normal bowel sounds heard. Central nervous system: Alert and oriented. No focal neurological deficits. Extremities: Symmetric 5 x 5 power. Skin: No rashes, lesions or ulcers Psychiatry: Judgement and insight appear normal. Mood & affect appropriate.    The results of significant diagnostics from this hospitalization (including imaging, microbiology, ancillary and laboratory) are listed below for reference.     Microbiology: Recent Results (from the past 240 hour(s))  SARS Coronavirus 2 [facility] order, Performed in [facility] hospital lab) Nasopharyngeal Nasopharyngeal Swab     Status: None   Collection Time: 12/23/18  9:49 PM   Specimen: Nasopharyngeal Swab  Result Value Ref Range Status   SARS Coronavirus 2 NEGATIVE NEGATIVE Final    Comment: (NOTE) If result is NEGATIVE SARS-CoV-2 target nucleic acids are NOT DETECTED. The SARS-CoV-2 RNA is generally detectable in upper  and lower  respiratory specimens during the acute phase of infection. The lowest  concentration of SARS-CoV-2 viral copies this assay can detect is 250  copies / mL. A negative result does not preclude SARS-CoV-2 infection  and should not be used as the sole basis for treatment or other  patient management decisions.  A negative result may occur with  improper specimen collection / handling, submission of specimen other  than nasopharyngeal swab, presence of viral mutation(s) within the  areas targeted by this assay, and inadequate number of viral copies  (<250 copies / mL). A negative result must be combined with clinical  observations, patient history, and epidemiological information. If result is POSITIVE SARS-CoV-2 target nucleic acids are DETECTED. The SARS-CoV-2 RNA is generally detectable in upper and lower  respiratory specimens  dur ing the acute phase of infection.  Positive  results are indicative of active infection with SARS-CoV-2.  Clinical  correlation with patient history and other diagnostic information is  necessary to determine patient infection status.  Positive results do  not rule out bacterial infection or co-infection with other viruses. If result is PRESUMPTIVE POSTIVE SARS-CoV-2 nucleic acids MAY BE PRESENT.   A presumptive positive result was obtained on the submitted specimen  and confirmed on repeat testing.  While 2019 novel coronavirus  (SARS-CoV-2) nucleic acids may be present in the submitted sample  additional confirmatory testing may be necessary for epidemiological  and / or clinical management purposes  to differentiate between  SARS-CoV-2 and other Sarbecovirus currently known to infect humans.  If clinically indicated additional testing with an alternate test  methodology [phone]) is advised. The SARS-CoV-2 RNA is generally  detectable in upper and lower respiratory sp ecimens during the acute  phase of infection. The expected result is  Negative. Fact Sheet for Patients:  StrictlyIdeas.no Fact Sheet for Healthcare Providers: BankingDealers.co.za This test is not yet approved or cleared by the [place] FDA and has been authorized for detection and/or diagnosis of SARS-CoV-2 by FDA under an Emergency Use Authorization (EUA).  This EUA will remain in effect (meaning this test can be used) for the duration of the COVID-19 declaration under Section 564(b)(1) of the Act, 21 U.S.C. section 360bbb-3(b)(1), unless the authorization is terminated or revoked sooner. Performed at [facility], [place] [address] [place], [place] [address]   Respiratory Panel by PCR     Status: None   Collection Time: 12/24/18  2:45 PM  Result Value Ref Range Status   Adenovirus NOT DETECTED NOT DETECTED Final   Coronavirus 229E NOT DETECTED NOT DETECTED Final    Comment: (NOTE) The Coronavirus on the Respiratory Panel, DOES NOT test for the novel  Coronavirus (2019 nCoV)    Coronavirus HKU1 NOT DETECTED NOT DETECTED Final   Coronavirus NL63 NOT DETECTED NOT DETECTED Final   Coronavirus OC43 NOT DETECTED NOT DETECTED Final   Metapneumovirus NOT DETECTED NOT DETECTED Final   Rhinovirus / Enterovirus NOT DETECTED NOT DETECTED Final   Influenza A NOT DETECTED NOT DETECTED Final   Influenza B NOT DETECTED NOT DETECTED Final   Parainfluenza Virus 1 NOT DETECTED NOT DETECTED Final   Parainfluenza Virus 2 NOT DETECTED NOT DETECTED Final   Parainfluenza Virus 3 NOT DETECTED NOT DETECTED Final   Parainfluenza Virus 4 NOT DETECTED NOT DETECTED Final   Respiratory Syncytial Virus NOT DETECTED NOT DETECTED Final   Bordetella pertussis NOT DETECTED NOT DETECTED Final   Chlamydophila pneumoniae NOT DETECTED NOT DETECTED Final   Mycoplasma pneumoniae NOT DETECTED NOT DETECTED Final    Comment: Performed at [facility], [place]. [address] [place], [place] [address]     Labs: CBC: Recent Labs   Lab 12/23/18 1446 12/23/18 2218 12/24/18 0340 12/24/18 1453 12/25/18 0434 12/26/18 0536  WBC 5.0  --  5.3 3.4* 3.1* 3.7*  NEUTROABS  --   --   --  2.7  --   --   HGB 12.0 13.3 12.3 12.2 12.1 12.5  HCT 37.7 39.0 38.3 38.0  37.9 37.5 39.0  MCV 100.0  --  98.0 98.4 96.6 96.8  PLT 87*  --  86* 80* 89* 937*   Basic Metabolic Panel: Recent Labs  Lab 12/23/18 1446 12/23/18 2218 12/24/18 0340 12/25/18 0434 12/26/18 0536  NA 140 141 138 136 138  K 3.9 4.1 3.8 4.4 4.3  CL 102  --  100 99 98  CO2 29  --  _0 GLUCOSE 175*  --  222* 248* 226*  BUN 7*  --  7* 13 17  CREATININE 0.64  --  0.65 0.72 0.85  CALCIUM 9.9  --  9.7 10.0 10.2  MG  --   --  1.9 2.0  --   PHOS  --   --  3.2 2.8  --    Liver Function Tests: Recent Labs  Lab 12/23/18 2209 12/24/18 0340 12/25/18 0434 12/26/18 0536  AST _1 ALT _2 ALKPHOS 55 58 56 57  BILITOT 1.8* 1.9* 1.4* 1.5*  PROT 7.1 7.2 7.1 7.1  ALBUMIN 3.8 4.1 3.8 3.9   BNP (last 3 results) Recent Labs    11/15/18 2108 12/23/18 1446  BNP 74.4 21.0   Cardiac Enzymes: No results for input(s): CKTOTAL, CKMB, CKMBINDEX, TROPONINI in the last 168 hours. CBG: Recent Labs  Lab 12/25/18 1207 12/25/18 1651 12/25/18 2154 12/26/18 0619 12/26/18 1151  GLUCAP 285* 258* 219* 199* 215*   Hgb A1c Recent Labs    12/24/18 0340  HGBA1C 8.1*   Lipid Profile No results for input(s): CHOL, HDL, LDLCALC, TRIG, CHOLHDL, LDLDIRECT in the last 72 hours. Thyroid function studies Recent Labs    12/23/18 2210  TSH 4.013   Anemia work up Recent Labs    12/23/18 2210 12/24/18 1453  VITAMINB12  --  650  FERRITIN 306  --   TIBC  --  293  IRON  --  66    Time coordinating discharge: 45 minutes  SIGNED:  Vernell Leep, MD, FACP, [facility]. [facility]  To contact the attending provider between 7A-7P or the covering provider during after hours 7P-7A, please log into the web site [email or web address] and access using  universal [place] password for that web site. If you do not have the password, please call the hospital operator.

## 2018-12-26 NOTE — TOC Transition Note (Addendum)
Transition of Care [facility]) - CM/SW Discharge Note   Patient Details  Name: Kathy Irwin MRN: [id number] Date of Birth: January 24, 1951  Transition of Care [facility]) CM/SW Contact:  Zenon Mayo, RN Phone Number: 12/26/2018, 1:01 PM   Clinical Narrative:    For discharge today, will need HHRN, [place], [place], patient states she has no preference of what agency, she also needs rollator, referral made to [facility] with [employer], he will bring to patient room.  Referral made to [facility] for [facility] services awaiting call back. Also NCM spoke with [place] at [place] and she states they will bring the D tanks to patient's home tomorrow since she is on continuous oxygen and she wants smaller tanks. Tiffany with [facility] states they can take referral, soc will start on Monday.   Patient for dc today, NCM informed her to have the person picking her up from the hospital to bring her oxygen tank to go home with.   Final next level of care: [place] Barriers to Discharge: No Barriers Identified   Patient Goals and CMS Choice Patient states their goals for this hospitalization and ongoing recovery are:: to get better CMS Medicare.gov Compare Post Acute Care list provided to:: Patient Choice offered to / list presented to : Patient  Discharge Placement                       Discharge Plan and Services                DME Arranged: Walker rolling with seat DME Agency: [employer] Date DME Agency Contacted: 12/26/18 Time DME Agency Contacted: [id number] Representative spoke with at DME Agency: zack HH Arranged: RN, PT, OT, Disease Management [place] Agency: [facility] [employer] Date [place]: 12/26/18 Time [place]: [place] Representative spoke with at [place]: [place] ([place]) Interventions     Readmission Risk Interventions No flowsheet data found.

## 2018-12-26 NOTE — Care Management Important Message (Signed)
Important Message  Patient Details  Name: Kathy Irwin MRN: [id number] Date of Birth: [birth year]   Medicare Important Message Given:  Yes     Shelda Altes 12/26/2018, 3:21 PM

## 2018-12-26 NOTE — Discharge Instructions (Signed)

## 2018-12-26 NOTE — Progress Notes (Addendum)
Inpatient Diabetes Program Recommendations  AACE/ADA: New Consensus Statement on Inpatient Glycemic Control (2015)  Target Ranges:  Prepandial:   less than 140 mg/dL      Peak postprandial:   less than 180 mg/dL (1-2 hours)      Critically ill patients:  140 - 180 mg/dL   Lab Results  Component Value Date   GLUCAP 199 (H) 12/26/2018   HGBA1C 8.1 (H) 12/24/2018    Review of Glycemic Control  Diabetes history: DM 2 Outpatient Diabetes medications: 75/25 50 units qam, 30 units qpm Current orders for Inpatient glycemic control:  Lantus 35 units qhs Novolog 0-15 units tid Novolog 0-5 units qhs  Renal WNL A1c: 7.6% on 8/1 8.1% on 9/8 Patient on steroids beginning of August  Steroids: Solumedrol 40 mg Q12 hours  Inpatient Diabetes Program Recommendations:    Noted Lantus increased yesterday. Fasting glucose 199 mg/dl.  Consider Novolog 4 units tid meal coverage if consuming at least 50% of meals.  Thanks,  Tama Headings RN, MSN, BC-ADM Inpatient Diabetes Coordinator Team Pager [phone] (8a-5p)

## 2018-12-26 NOTE — Progress Notes (Signed)
  Mobility Specialist Criteria Algorithm Info.  SATURATION QUALIFICATIONS: (This note is used to comply with regulatory documentation for home oxygen)  Patient Saturations on Room Air at Rest = 95%  Patient Saturations on Room Air while Ambulating = 83%  Patient Saturations on 2 Liters of oxygen while Ambulating = 94%  Please briefly explain why patient needs home oxygen: Initially upon entry the patient was saturating at 88% on room air. 2LO2 was applied via [place] in which oxygen saturation increased to 97%. Patient was then took off oxygen again for approximately 10 minutes to assess ambulatory oxygen saturation. At rest on room air, oxygen saturation was 95% and after 31f of ambulation on room air, oxygen desaturated quickly to 83%. 2LO2 was needed to maintain a saturation <94% while ambulating.  Mobility:  Activity: Ambulated in hall (To chair after ambulation) Range of motion: Active;All extremities Level of assistance: Standby assist, set-up cues, supervision of patient - no hands on Assistive device: Four wheel walker Minutes stood: 5 minutes Minutes ambulated: 5 minutes Distance ambulated (ft): 120 ft Mobility response: Tolerated well;RN notified (No c/o dizziness, SOB, lightheadedness or CP) Mobility Status: Yes, no lift needed   12/26/2018 11:15 AM

## 2018-12-30 ENCOUNTER — Telehealth: Payer: Self-pay | Admitting: Hematology

## 2018-12-30 NOTE — Telephone Encounter (Signed)
I received a new patient referral from [name], PA from [place] at [facility] for hx of cml. Kathy Irwin has been scheduled to see Dr. Irene Limbo on 10/6 at [place] and directions mailed to the pt.

## 2018-12-31 ENCOUNTER — Encounter: Payer: Self-pay | Admitting: Hematology

## 2019-01-08 ENCOUNTER — Other Ambulatory Visit: Payer: Self-pay | Admitting: Family Medicine

## 2019-01-08 DIAGNOSIS — E041 Nontoxic single thyroid nodule: Secondary | ICD-10-CM

## 2019-01-09 ENCOUNTER — Other Ambulatory Visit: Payer: Self-pay

## 2019-01-09 ENCOUNTER — Ambulatory Visit (INDEPENDENT_AMBULATORY_CARE_PROVIDER_SITE_OTHER): Payer: Medicare Other | Admitting: Primary Care

## 2019-01-09 ENCOUNTER — Encounter (INDEPENDENT_AMBULATORY_CARE_PROVIDER_SITE_OTHER): Payer: Self-pay | Admitting: Primary Care

## 2019-01-09 VITALS — BP 167/91 | HR 59 | Temp 97.3°F | Ht 64.0 in | Wt 260.6 lb

## 2019-01-09 DIAGNOSIS — E119 Type 2 diabetes mellitus without complications: Secondary | ICD-10-CM

## 2019-01-09 DIAGNOSIS — F458 Other somatoform disorders: Secondary | ICD-10-CM

## 2019-01-09 DIAGNOSIS — Z760 Encounter for issue of repeat prescription: Secondary | ICD-10-CM

## 2019-01-09 DIAGNOSIS — Z09 Encounter for follow-up examination after completed treatment for conditions other than malignant neoplasm: Secondary | ICD-10-CM

## 2019-01-09 DIAGNOSIS — I10 Essential (primary) hypertension: Secondary | ICD-10-CM

## 2019-01-09 LAB — GLUCOSE, POCT (MANUAL RESULT ENTRY): POC Glucose: 128 mg/dl — AB (ref 70–99)

## 2019-01-09 MED ORDER — GLIMEPIRIDE 2 MG PO TABS: 2.0000 mg | ORAL_TABLET | Freq: Every day | ORAL | 3 refills | Status: DC

## 2019-01-09 MED ORDER — METOPROLOL SUCCINATE ER 200 MG PO TB24: 200.0000 mg | ORAL_TABLET | Freq: Two times a day (BID) | ORAL | 3 refills | Status: DC

## 2019-01-09 MED ORDER — PRAVASTATIN SODIUM 10 MG PO TABS: 10.0000 mg | ORAL_TABLET | Freq: Every day | ORAL | 3 refills | Status: DC

## 2019-01-09 MED ORDER — INSULIN LISPRO PROT & LISPRO (75-25 MIX) 100 UNIT/ML KWIKPEN: 80.0000 [IU] | PEN_INJECTOR | SUBCUTANEOUS | 3 refills | Status: DC

## 2019-01-09 MED ORDER — SERTRALINE HCL 50 MG PO TABS: 50.0000 mg | ORAL_TABLET | Freq: Every evening | ORAL | 3 refills | Status: DC

## 2019-01-09 MED ORDER — ATORVASTATIN CALCIUM 20 MG PO TABS: 20.0000 mg | ORAL_TABLET | Freq: Every day | ORAL | 3 refills | Status: DC

## 2019-01-09 MED ORDER — GABAPENTIN 300 MG PO CAPS: 300.0000 mg | ORAL_CAPSULE | Freq: Two times a day (BID) | ORAL | 3 refills | Status: DC

## 2019-01-09 NOTE — Progress Notes (Signed)
Established Patient Office Visit  Subjective:  Patient ID: Kathy Irwin, female    DOB: Apr 04, 1951  Age: 68 y.o. MRN: [id number]  CC:  Chief Complaint  Patient presents with  . Hospitalization Follow-up    acute respiratory failure w hypoxia/ hyperglycemia     HPI Kathy Irwin presents emergency room  for shortness of breath especially with   exertion she also felt she was getting lightheaded  and sometimes feels very dizzy. Admitted to hospital for acute respiratory failure with hypoxia . Note on July 31 positive for COVID /pneumonia   Past Medical History:  Diagnosis Date  . Acute respiratory failure with hypoxia ([place]) 12/2018  . Diabetes mellitus without complication ([place])   . 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 Hypertension   . ILD (interstitial lung disease) ([place]) 12/24/2018    Past Surgical History:  Procedure Laterality Date  . ABDOMINAL HYSTERECTOMY      Family History  Problem Relation Age of Onset  . Diabetes Other   . Hypertension Other     Social History   Socioeconomic History  . Marital status: Married    Spouse name: Not on file  . Number of children: Not on file  . Years of education: Not on file  . Highest education level: Not on file  Occupational History  . Not on file  Social Needs  . Financial resource strain: Not on file  . Food insecurity    Worry: Not on file    Inability: Not on file  . Transportation needs    Medical: Not on file    Non-medical: Not on file  Tobacco Use  . Smoking status: Never Smoker  . Smokeless tobacco: Never Used  Substance and Sexual Activity  . Alcohol use: Never    Frequency: Never  . Drug use: Never  . Sexual activity: Not on file  Lifestyle  . Physical activity    Days per week: Not on file    Minutes per session: Not on file  . Stress: Not on file  Relationships  . Social Herbalist on phone: Not on file    Gets together: Not on file    Attends religious service: Not on  file    Active member of club or organization: Not on file    Attends meetings of clubs or organizations: Not on file    Relationship status: Not on file  . Intimate partner violence    Fear of current or ex partner: Not on file    Emotionally abused: Not on file    Physically abused: Not on file    Forced sexual activity: Not on file  Other Topics Concern  . Not on file  Social History Narrative  . Not on file    Outpatient Medications Prior to Visit  Medication Sig Dispense Refill  . acetaminophen (TYLENOL) 500 MG tablet Take 1,000 mg by mouth 2 (two) times daily as needed (for pain).    Marland Kitchen albuterol (PROAIR HFA) 108 (90 Base) MCG/ACT inhaler Inhale 2 puffs into the lungs every 6 (six) hours as needed for wheezing or shortness of breath. 6.7 g 1  . albuterol (PROVENTIL) (2.5 MG/3ML) 0.083% nebulizer solution Take 3 mLs (2.5 mg total) by nebulization every 6 (six) hours as needed for wheezing or shortness of breath. 75 mL 1  . Azelastine-Fluticasone (DYMISTA NA) Place 1-2 sprays into both nostrils daily as needed (for allergies).     . blood glucose meter kit and supplies  KIT Dispense based on patient and insurance preference. Use up to four times daily as directed. (FOR ICD-9 250.00, 250.01). For QAC - HS accuchecks. 1 each 1  . diphenhydrAMINE (BENADRYL) 50 MG tablet Take 0.5 tablets (25 mg total) by mouth at bedtime as needed for itching. 30 tablet 0  . FLUoxetine (PROZAC) 20 MG tablet TAKE 1 TABLET BY MOUTH EVERY DAY 90 tablet 2  . furosemide (LASIX) 40 MG tablet Take 1 tablet (40 mg total) by mouth daily. 30 tablet 0  . glucose blood (FREESTYLE LITE) test strip For glucose testing every before meals at bedtime. Diagnosis E 11.65  Can substitute to any accepted brand 100 each 0  . Melatonin 5 MG CAPS Take 1 capsule by mouth daily as needed (For sleep).    Marland Kitchen omeprazole (PRILOSEC) 20 MG capsule Take 1 capsule (20 mg total) by mouth daily. 30 capsule 0  . predniSONE (DELTASONE) 20 MG  tablet Take 2 tablets daily (total 40 mg) for 2 weeks,  then 1 tablet daily (total 56m) for 2 weeks then half tablet daily (10 mg total) for 2 weeks. 50 tablet 0  . VITAMIN D PO Take 1 tablet by mouth daily.    .Marland Kitchenatorvastatin (LIPITOR) 20 MG tablet Take 1 tablet (20 mg total) by mouth at bedtime. 30 tablet 3  . gabapentin (NEURONTIN) 300 MG capsule Take 300 mg by mouth 2 (two) times daily.    . Insulin Lispro Prot & Lispro (HUMALOG MIX 75/25 KWIKPEN) (75-25) 100 UNIT/ML Kwikpen Inject 40 Units into the skin See admin instructions. Inject 50 units into the skin before breakfast "and a second dose at bedtime 30 units 15 mL 3  . metoprolol (TOPROL-XL) 200 MG 24 hr tablet Take 1 tablet (200 mg total) by mouth 2 (two) times daily. 60 tablet 3  . oxybutynin (DITROPAN-XL) 10 MG 24 hr tablet Take 1 tablet (10 mg total) by mouth daily as needed (for symptoms of bladder instability). 30 tablet 3  . glimepiride (AMARYL) 2 MG tablet Take 2 mg by mouth daily with breakfast.    . pravastatin (PRAVACHOL) 10 MG tablet Take 10 mg by mouth daily.    . sertraline (ZOLOFT) 50 MG tablet Take 50 mg by mouth every evening.     No facility-administered medications prior to visit.     Allergies  Allergen Reactions  . Other Shortness Of Breath and Other (See Comments)    Newspaper ink =  new chest pain, also  . Iodine Other (See Comments)    "Was a long time ago" (Reaction??)  . Merbromin Other (See Comments)    Mercurochrome- "Was a long time ago" (Reaction??)    ROS Review of Systems  Respiratory: Positive for cough and shortness of breath.   Cardiovascular: Positive for leg swelling.  All other systems reviewed and are negative.     Objective:    Physical Exam  Constitutional: She is oriented to person, place, and time. She appears well-developed and well-nourished.  HENT:  Head: Normocephalic.  Neck: Normal range of motion. Neck supple.  Cardiovascular: Normal rate and regular rhythm.   Pulmonary/Chest: Effort normal and breath sounds normal.  Abdominal: Soft. Bowel sounds are normal. She exhibits distension.  Musculoskeletal: Normal range of motion.  Neurological: She is oriented to person, place, and time.  Skin: Skin is warm and dry.  Psychiatric: She has a normal mood and affect.    BP (!) 167/91 (BP Location: Left Arm, Patient Position: Sitting, Cuff Size: Normal)  Pulse (!) 59   Temp (!) 97.3 F (36.3 C) (Tympanic)   Ht _0  (1.626 m)   Wt 260 lb 9.6 oz (118.2 kg)   SpO2 97%   BMI 44.73 kg/m  Wt Readings from Last 3 Encounters:  01/09/19 260 lb 9.6 oz (118.2 kg)  12/26/18 262 lb 11.2 oz (119.2 kg)  11/15/18 260 lb (117.9 kg)     Health Maintenance Due  Topic Date Due  . Hepatitis C Screening  Jun 07, 1950  . FOOT EXAM  06/20/1960  . OPHTHALMOLOGY EXAM  06/20/1960  . URINE MICROALBUMIN  06/20/1960  . TETANUS/TDAP  06/20/1969  . MAMMOGRAM  06/20/2000  . COLONOSCOPY  06/20/2000  . DEXA SCAN  06/21/2015  . PNA vac Low Risk Adult (1 of 2 - PCV13) 06/21/2015  . INFLUENZA VACCINE  11/16/2018    There are no preventive care reminders to display for this patient.  Lab Results  Component Value Date   TSH 4.013 12/23/2018   Lab Results  Component Value Date   WBC 6.6 01/09/2019   HGB 12.7 01/09/2019   HCT 37.0 01/09/2019   MCV 92 01/09/2019   PLT 117 (L) 01/09/2019   Lab Results  Component Value Date   NA 140 01/09/2019   K 4.4 01/09/2019   CO2 25 01/09/2019   GLUCOSE 118 (H) 01/09/2019   BUN 11 01/09/2019   CREATININE 0.66 01/09/2019   BILITOT 1.1 01/09/2019   ALKPHOS 54 01/09/2019   AST 14 01/09/2019   ALT 20 01/09/2019   PROT 6.7 01/09/2019   ALBUMIN 4.6 01/09/2019   CALCIUM 10.2 01/09/2019   ANIONGAP 11 12/26/2018   No results found for: CHOL No results found for: HDL No results found for: LDLCALC No results found for: TRIG No results found for: [facility] Lab Results  Component Value Date   HGBA1C 8.1 (H) 12/24/2018       Assessment & Plan:  Vessie was seen today for hospitalization follow-up.  Diagnoses and all orders for this visit:  Type 2 diabetes mellitus without complication, without long-term current use of insulin ([place]) Recommendations For Diabetic/Prediabetic Patients:  - Exercise at least 5 times a week for 30 minutes or preferably daily.  - No Smoking - Drink less than 2 drinks a day.  - Monitor your feet for sores - Have yearly Eye Exams - Recommend annual Flu vaccine  - Recommend Pneumovax and Prevnar vaccines - Shingles Vaccine (Zostavax) if over 77 y.o. Goals:  - BMI less than 24 - Fasting sugar less than 130 or less than 150 if tapering medicines to lose weight  - Systolic BP less than 858  - Diastolic BP less than 80 - Bad LDL Cholesterol less than 70 - Triglycerides less than 150 -     Glucose (CBG)  Essential hypertension Counseled on blood pressure goal of less than 130/80, low-sodium, DASH diet, medication compliance, 150 minutes of moderate intensity exercise per week. Discussed medication compliance, adverse effects. -     CBC with Differential -     Comprehensive metabolic panel  Hyperventilation syndrome Obesity hypoventilation syndrome is a condition in which severely overweight people fail to breathe rapidly or deeply enough, resulting in low oxygen levels and high blood carbon dioxide levels. The syndrome is often associated with obstructive sleep apnea, which causes periods of absent or reduced breathing in sleep, resulting in many partial awakenings during the night and sleepiness during the day. The disease puts strain on the heart, which may lead to heart  failure and leg swelling. -     CBC with Differential -     Comprehensive metabolic panel  Hospital discharge follow-up Admitted on 12/23/2018 after presenting to the emergency room with persistent shortness of breath and was worst with exertion. Recommended PCP follow up and repeat labs. -     CBC with  Differential -     Comprehensive metabolic panel  Other orders/Medication Refills -     pravastatin (PRAVACHOL) 10 MG tablet; Take 1 tablet (10 mg total) by mouth daily. -     glimepiride (AMARYL) 2 MG tablet; Take 1 tablet (2 mg total) by mouth daily with breakfast. -     atorvastatin (LIPITOR) 20 MG tablet; Take 1 tablet (20 mg total) by mouth at bedtime. -     metoprolol (TOPROL-XL) 200 MG 24 hr tablet; Take 1 tablet (200 mg total) by mouth 2 (two) times daily. -     gabapentin (NEURONTIN) 300 MG capsule; Take 1 capsule (300 mg total) by mouth 2 (two) times daily. -     sertraline (ZOLOFT) 50 MG tablet; Take 1 tablet (50 mg total) by mouth every evening. -     Insulin Lispro Prot & Lispro (HUMALOG MIX 75/25 KWIKPEN) (75-25) 100 UNIT/ML Kwikpen; Inject 80 Units into the skin See admin instructions. Inject 50 units into the skin before breakfast "and a second dose at bedtime 30 units    Meds ordered this encounter  Medications  . pravastatin (PRAVACHOL) 10 MG tablet    Sig: Take 1 tablet (10 mg total) by mouth daily.    Dispense:  30 tablet    Refill:  3  . glimepiride (AMARYL) 2 MG tablet    Sig: Take 1 tablet (2 mg total) by mouth daily with breakfast.    Dispense:  30 tablet    Refill:  3  . atorvastatin (LIPITOR) 20 MG tablet    Sig: Take 1 tablet (20 mg total) by mouth at bedtime.    Dispense:  30 tablet    Refill:  3  . metoprolol (TOPROL-XL) 200 MG 24 hr tablet    Sig: Take 1 tablet (200 mg total) by mouth 2 (two) times daily.    Dispense:  60 tablet    Refill:  3  . gabapentin (NEURONTIN) 300 MG capsule    Sig: Take 1 capsule (300 mg total) by mouth 2 (two) times daily.    Dispense:  60 capsule    Refill:  3  . sertraline (ZOLOFT) 50 MG tablet    Sig: Take 1 tablet (50 mg total) by mouth every evening.    Dispense:  30 tablet    Refill:  3  . Insulin Lispro Prot & Lispro (HUMALOG MIX 75/25 KWIKPEN) (75-25) 100 UNIT/ML Kwikpen    Sig: Inject 80 Units into the skin See  admin instructions. Inject 50 units into the skin before breakfast "and a second dose at bedtime 30 units    Dispense:  30 mL    Refill:  3    Follow-up: Return in about 2 months (around 03/11/2019) for T2D.    Kerin Perna, NP

## 2019-01-09 NOTE — Patient Instructions (Signed)
Hypoxia Hypoxia is a condition that happens when there is a lack of oxygen in the body's tissues and organs. When there is not enough oxygen, organs cannot work as they should. This causes serious problems throughout the body and in the brain. What are the causes? This condition may be caused by:  Exposure to high altitude.  A collapsed lung (pneumothorax).  Lung infection (pneumonia).  Lung injury.  Long-term (chronic) lung disease, such as COPD (chronic obstructive pulmonary disease).  Blood collecting in the chest cavity (hemothorax).  Food, saliva, or vomit getting into the airway (aspiration).  Reduced blood flow (ischemia).  Severe blood loss.  Slow or shallow breathing (hypoventilation).  Blood disorders, such as anemia.  Carbon monoxide poisoning.  The heart suddenly stopping (cardiac arrest).  Anesthetic medicines.  Drowning.  Choking. What are the signs or symptoms? Symptoms of this condition include:  Headache.  Fatigue.  Drowsiness.  Forgetfulness.  Nausea.  Confusion.  Shortness of breath.  Dizziness.  Bluish color of the skin, lips, or nail beds (cyanosis).  Change in consciousness or awareness. If hypoxia is not treated, it can lead to convulsions, loss of consciousness (coma), or brain damage. How is this diagnosed? This condition may be diagnosed based on:  A physical exam.  Blood tests.  A test that measures how much oxygen is in your blood (pulse oximetry). This is done with a sensor that is placed on your finger, toe, or earlobe.  Chest X-ray.  Tests to check your lung function (pulmonary function tests).  A test to check the electrical activity of your heart (electrocardiogram, ECG). You may have other tests to determine the cause of your hypoxia. How is this treated?  Treatment for this condition depends on what is causing the hypoxia. You will likely be treated with oxygen therapy. This may be done by giving you oxygen  through a face mask or through tubes in your nose. Your health care provider may also recommend other therapies to treat the underlying cause of your hypoxia. Follow these instructions at home:  Take over-the-counter and prescription medicines only as told by your health care provider.  Do not use any products that contain nicotine or tobacco, such as cigarettes and e-cigarettes. If you need help quitting, ask your health care provider.  Avoid secondhand smoke.  Work with your health care provider to manage any chronic conditions you have that may be causing hypoxia, such as COPD.  Keep all follow-up visits as told by your health care provider. This is important. Contact a health care provider if:  You have a fever.  You have trouble breathing, even after treatment.  You become extremely short of breath when you exercise. Get help right away if:  Your shortness of breath gets worse, especially with normal or very little activity.  Your skin, lips, or nail beds have a bluish color.  You become confused or you cannot think properly.  You have chest pain. Summary  Hypoxia is a condition that happens when there is a lack of oxygen in the body's tissues and organs.  If hypoxia is not treated, it can lead to convulsions, loss of consciousness (coma), or brain damage.  Symptoms of hypoxia can include a headache, shortness of breath, confusion, nausea, and a bluish skin color.  Hypoxia has many possible causes, including exposure to high altitude, carbon monoxide poisoning, or other health issues, such as blood disorders or cardiac arrest.  Hypoxia is usually treated with oxygen therapy. This information is not  intended to replace advice given to you by your health care provider. Make sure you discuss any questions you have with your health care provider. Document Released: 05/22/2016 Document Revised: 03/16/2017 Document Reviewed: 05/22/2016 Elsevier Patient Education  2020  [employer].

## 2019-01-10 LAB — COMPREHENSIVE METABOLIC PANEL
ALT: 20 IU/L (ref 0–32)
AST: 14 IU/L (ref 0–40)
Albumin/Globulin Ratio: 2.2 (ref 1.2–2.2)
Albumin: 4.6 g/dL (ref 3.8–4.8)
Alkaline Phosphatase: 54 IU/L (ref 39–117)
BUN/Creatinine Ratio: 17 (ref 12–28)
BUN: 11 mg/dL (ref 8–27)
Bilirubin Total: 1.1 mg/dL (ref 0.0–1.2)
CO2: 25 mmol/L (ref 20–29)
Calcium: 10.2 mg/dL (ref 8.7–10.3)
Chloride: 101 mmol/L (ref 96–106)
Creatinine, Ser: 0.66 mg/dL (ref 0.57–1.00)
GFR calc Af Amer: 105 mL/min/{1.73_m2} (ref >59)
GFR calc non Af Amer: 91 mL/min/{1.73_m2} (ref >59)
Globulin, Total: 2.1 g/dL (ref 1.5–4.5)
Glucose: 118 mg/dL — ABNORMAL HIGH (ref 65–99)
Potassium: 4.4 mmol/L (ref 3.5–5.2)
Sodium: 140 mmol/L (ref 134–144)
Total Protein: 6.7 g/dL (ref 6.0–8.5)

## 2019-01-10 LAB — CBC WITH DIFFERENTIAL/PLATELET
Basophils Absolute: 0 10*3/uL (ref 0.0–0.2)
Basos: 0 %
EOS (ABSOLUTE): 0 10*3/uL (ref 0.0–0.4)
Eos: 0 %
Hematocrit: 37 % (ref 34.0–46.6)
Hemoglobin: 12.7 g/dL (ref 11.1–15.9)
Immature Grans (Abs): 0 10*3/uL (ref 0.0–0.1)
Immature Granulocytes: 1 %
Lymphocytes Absolute: 1.6 10*3/uL (ref 0.7–3.1)
Lymphs: 24 %
MCH: 31.4 pg (ref 26.6–33.0)
MCHC: 34.3 g/dL (ref 31.5–35.7)
MCV: 92 fL (ref 79–97)
Monocytes Absolute: 0.6 10*3/uL (ref 0.1–0.9)
Monocytes: 9 %
NRBC: 1 % — ABNORMAL HIGH (ref 0–0)
Neutrophils Absolute: 4.4 10*3/uL (ref 1.4–7.0)
Neutrophils: 66 %
Platelets: 117 10*3/uL — ABNORMAL LOW (ref 150–450)
RBC: 4.04 x10E6/uL (ref 3.77–5.28)
RDW: 18 % — ABNORMAL HIGH (ref 11.7–15.4)
WBC: 6.6 10*3/uL (ref 3.4–10.8)

## 2019-01-13 ENCOUNTER — Ambulatory Visit
Admission: RE | Admit: 2019-01-13 | Discharge: 2019-01-13 | Disposition: A | Discharge status: Home / Self Care | Payer: Medicare Other | Source: Ambulatory Visit | Attending: Family Medicine | Admitting: Family Medicine

## 2019-01-13 DIAGNOSIS — E041 Nontoxic single thyroid nodule: Secondary | ICD-10-CM

## 2019-01-20 ENCOUNTER — Other Ambulatory Visit: Payer: Self-pay | Admitting: Family Medicine

## 2019-01-20 NOTE — Progress Notes (Signed)
HEMATOLOGY/ONCOLOGY CONSULTATION NOTE  Date of Service: 01/20/2019  Patient Care Team: Kerin Perna, NP as PCP - General (Internal Medicine)  CHIEF COMPLAINTS/PURPOSE OF CONSULTATION:  CML  HISTORY OF PRESENTING ILLNESS:   Kathy Irwin is a wonderful 68 y.o. 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 The pt reports that she is doing well overall.  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 They also did a BM Bx which showed that she had chronic leukemia but she is unsure what kind. She has not needed any treatments in 6 years and was only monitored with labs. Her RBC and WBC have remained within the normal range since her diagnosis. Previously her PLTs have gotten as low as 30K but have come back up on their own. She denies any pain or issues related to her leukemia within the last 6 years.   Pt had a recent Dane infection in August. She went back to the hospital on 09/07 with bacterial Pneumonia and was discharged on 09/10. Pt notes that her breathing has improved since that hospitalization but is not back to baseline. She was sent home with oxygen and has been using it. Pt has been using oxygen for about 8 years and she is not sure why it was given to her. Pt was previously diagnosed with Interstitial Lung Disease. She says that when her symptoms worsen she is given steroids for 6 weeks. She is currently on a course of Prednisone. Pt has had sleep apnea testing which revealed that she does not have sleep apnea. Her HTN and Diabetes have been well controlled and she has been taking her medications as prescribed. She also has home care who has been helping her walk and get some exercise. She will be going to see Dr. Ina Homes, a Pulmonologist, on 10/08.   Most recent lab results (01/09/2019) of CBC w/diff & CMP is as  follows: all values are WNL except for RDW at 18.0, PLTs at 117K, nRBC Rel at 1, Glucose at 118.   On review of systems, pt denies back pain, abdominal pain and any other symptoms.   On PMHx the pt reports ILD, HTN, Diabetes, COVID19 infection, bacterial Pneumonia. 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   Hypertension    ILD (interstitial lung disease) ([place]) 12/24/2018    SURGICAL HISTORY: Past Surgical History:  Procedure Laterality Date   ABDOMINAL HYSTERECTOMY      SOCIAL HISTORY: Social History   Socioeconomic History   Marital status: Married    Spouse name: Not on file   Number of children: Not on file   Years of education: Not on file   Highest education level: Not on file  Occupational History   Not on file  Social Needs   Financial resource strain: Not on file   Food insecurity    Worry: Not on file    Inability: Not on file   Transportation needs    Medical: Not on file    Non-medical: Not on file  Tobacco Use   Smoking status: Never Smoker   Smokeless tobacco: Never  Used  Substance and Sexual Activity   Alcohol use: Never    Frequency: Never   Drug use: Never   Sexual activity: Not on file  Lifestyle   Physical activity    Days per week: Not on file    Minutes per session: Not on file   Stress: Not on file  Relationships   Social connections    Talks on phone: Not on file    Gets together: Not on file    Attends religious service: Not on file    Active member of club or organization: Not on file    Attends meetings of clubs or organizations: Not on file    Relationship status: Not on file   Intimate partner violence    Fear of current or ex partner: Not on file    Emotionally abused:  Not on file    Physically abused: Not on file    Forced sexual activity: Not on file  Other Topics Concern   Not on file  Social History Narrative   Not on file    FAMILY HISTORY: Family History  Problem Relation Age of Onset   Diabetes Other    Hypertension Other     ALLERGIES:  is allergic to other; iodine; and merbromin.  MEDICATIONS:  Current Outpatient Medications  Medication Sig Dispense Refill   acetaminophen (TYLENOL) 500 MG tablet Take 1,000 mg by mouth 2 (two) times daily as needed (for pain).     albuterol (PROAIR HFA) 108 (90 Base) MCG/ACT inhaler Inhale 2 puffs into the lungs every 6 (six) hours as needed for wheezing or shortness of breath. 6.7 g 1   albuterol (PROVENTIL) (2.5 MG/3ML) 0.083% nebulizer solution Take 3 mLs (2.5 mg total) by nebulization every 6 (six) hours as needed for wheezing or shortness of breath. 75 mL 1   atorvastatin (LIPITOR) 20 MG tablet Take 1 tablet (20 mg total) by mouth at bedtime. 30 tablet 3   Azelastine-Fluticasone (DYMISTA NA) Place 1-2 sprays into both nostrils daily as needed (for allergies).      blood glucose meter kit and supplies KIT Dispense based on patient and insurance preference. Use up to four times daily as directed. (FOR ICD-9 250.00, 250.01). For QAC - HS accuchecks. 1 each 1   diphenhydrAMINE (BENADRYL) 50 MG tablet Take 0.5 tablets (25 mg total) by mouth at bedtime as needed for itching. 30 tablet 0   FLUoxetine (PROZAC) 20 MG tablet TAKE 1 TABLET BY MOUTH EVERY DAY 90 tablet 2   furosemide (LASIX) 40 MG tablet Take 1 tablet (40 mg total) by mouth daily. 30 tablet 0   gabapentin (NEURONTIN) 300 MG capsule Take 1 capsule (300 mg total) by mouth 2 (two) times daily. 60 capsule 3   glimepiride (AMARYL) 2 MG tablet Take 1 tablet (2 mg total) by mouth daily with breakfast. 30 tablet 3   glucose blood (FREESTYLE LITE) test strip For glucose testing every before meals at bedtime. Diagnosis E 11.65  Can  substitute to any accepted brand 100 each 0   Insulin Lispro Prot & Lispro (HUMALOG MIX 75/25 KWIKPEN) (75-25) 100 UNIT/ML Kwikpen Inject 80 Units into the skin See admin instructions. Inject 50 units into the skin before breakfast "and a second dose at bedtime 30 units 30 mL 3   Melatonin 5 MG CAPS Take 1 capsule by mouth daily as needed (For sleep).     metoprolol (TOPROL-XL) 200 MG 24 hr tablet Take 1 tablet (200 mg total) by mouth  2 (two) times daily. 60 tablet 3   omeprazole (PRILOSEC) 20 MG capsule Take 1 capsule (20 mg total) by mouth daily. 30 capsule 0   pravastatin (PRAVACHOL) 10 MG tablet Take 1 tablet (10 mg total) by mouth daily. 30 tablet 3   predniSONE (DELTASONE) 20 MG tablet Take 2 tablets daily (total 40 mg) for 2 weeks,  then 1 tablet daily (total 85m) for 2 weeks then half tablet daily (10 mg total) for 2 weeks. 50 tablet 0   sertraline (ZOLOFT) 50 MG tablet Take 1 tablet (50 mg total) by mouth every evening. 30 tablet 3   VITAMIN D PO Take 1 tablet by mouth daily.     No current facility-administered medications for this visit.     REVIEW OF SYSTEMS:    10 Point review of Systems was done is negative except as noted above.  PHYSICAL EXAMINATION: ECOG PERFORMANCE STATUS: 2 - Symptomatic, <50% confined to bed  . Vitals:   01/21/19 1125  BP: (!) 144/70  Pulse: 76  Resp: (!) 22  Temp: 98.2 F (36.8 C)  SpO2: 91%   Filed Weights   01/21/19 1125  Weight: 261 lb 6.4 oz (118.6 kg)   .Body mass index is 44.87 kg/m.  GENERAL:alert, in no acute distress and comfortable SKIN: no acute rashes, no significant lesions EYES: conjunctiva are pink and non-injected, sclera anicteric OROPHARYNX: MMM, no exudates, no oropharyngeal erythema or ulceration NECK: supple, no JVD LYMPH:  no palpable lymphadenopathy in the cervical, axillary or inguinal regions LUNGS: clear to auscultation b/l with normal respiratory effort HEART: regular rate & rhythm ABDOMEN:  normoactive bowel sounds , non tender, not distended. Extremity: trace pedal edema PSYCH: alert & oriented x 3 with fluent speech NEURO: no focal motor/sensory deficits  LABORATORY DATA:  I have reviewed the data as listed  . CBC Latest Ref Rng & Units 01/21/2019 01/09/2019 12/26/2018  WBC 4.0 - 10.5 K/uL 6.9 6.6 3.7(L)  Hemoglobin 12.0 - 15.0 g/dL 11.8(L) 12.7 12.5  Hematocrit 36.0 - 46.0 % 36.0 37.0 39.0  Platelets 150 - 400 K/uL 109(L) 117(L) 101(L)    . CMP Latest Ref Rng & Units 01/21/2019 01/09/2019 12/26/2018  Glucose 70 - 99 mg/dL 184(H) 118(H) 226(H)  BUN 8 - 23 mg/dL _0 Creatinine 0.44 - 1.00 mg/dL 0.72 0.66 0.85  Sodium 135 - 145 mmol/L 142 140 138  Potassium 3.5 - 5.1 mmol/L 4.4 4.4 4.3  Chloride 98 - 111 mmol/L 107 101 98  CO2 22 - 32 mmol/L _1 Calcium 8.9 - 10.3 mg/dL 9.7 10.2 10.2  Total Protein 6.5 - 8.1 g/dL 6.5 6.7 7.1  Total Bilirubin 0.3 - 1.2 mg/dL 1.1 1.1 1.5(H)  Alkaline Phos 38 - 126 U/L 53 54 57  AST 15 - 41 U/L _2 ALT 0 - 44 U/L _3 RADIOGRAPHIC STUDIES: I have personally reviewed the radiological images as listed and agreed with the findings in the report. Dg Chest 2 View  Result Date: 12/23/2018 CLINICAL DATA:  Shortness of breath EXAM: CHEST - 2 VIEW COMPARISON:  12/17/2018 FINDINGS: The heart size and mediastinal contours are stable. Minimal bibasilar opacities, improving compared to prior. No new focal airspace opacity. No pleural effusion or pneumothorax. The visualized skeletal structures are unremarkable. IMPRESSION: Improving bibasilar airspace opacities. Electronically Signed   By: NDavina PokeM.D.   On: 12/23/2018 15:25   Ct Angio Chest Pe W And/or Wo  Contrast  Result Date: 12/23/2018 CLINICAL DATA:  Clinically suspected pulmonary embolus. EXAM: CT ANGIOGRAPHY CHEST WITH CONTRAST TECHNIQUE: Multidetector CT imaging of the chest was performed using the standard protocol during bolus administration of intravenous  contrast. Multiplanar CT image reconstructions and MIPs were obtained to evaluate the vascular anatomy. CONTRAST:  164m OMNIPAQUE IOHEXOL 350 MG/ML SOLN COMPARISON:  December 17, 2017 FINDINGS: Cardiovascular: Preferential opacification of the thoracic aorta. No evidence of thoracic aortic aneurysm or dissection. Normal heart size. No pericardial effusion. No central pulmonary embolus. The segmental and subsegmental branches are suboptimally visualized. Mediastinum/Nodes: 1.6 cm right thyroid hypoattenuated nodule. No evidence of lymphadenopathy. Normal appearance of the trachea and esophagus. Lungs/Pleura: Mild chronic interstitial lung changes. Low lung volumes and breathing motion artifact. Hazy interstitial and airspace opacities throughout both lungs with mosaic distribution and dependent predominance. Upper Abdomen: Peripheral calcifications within the gallbladder. No evidence of biliary ductal dilation. Musculoskeletal: No chest wall abnormality. No acute or significant osseous findings. Review of the MIP images confirms the above findings. IMPRESSION: 1. No evidence of thoracic aortic aneurysm or dissection. 2. No evidence of central pulmonary embolus. The segmental and subsegmental branches are suboptimally visualized. 3. Hazy interstitial and airspace opacities throughout both lungs with mosaic distribution and dependent predominance. Differential diagnosis includes pulmonary edema, atypical multifocal pneumonia, or interstitial lung disease. 4. Peripheral calcifications within the gallbladder. This may represent cholelithiasis, however gallbladder calcifications are also a consideration. Further evaluation with right upper quadrant ultrasound or nonemergent abdominal MRI may be considered. 5. 1.6 cm right thyroid hypoattenuated nodule. Further evaluation with thyroid ultrasound may be considered. Aortic Atherosclerosis (ICD10-I70.0). Electronically Signed   By: DFidela SalisburyM.D.   On: 12/23/2018  19:05   Dg Sniff Test  Result Date: 12/25/2018 CLINICAL DATA:  Shortness of breath.  Elevated hemidiaphragm on CT. EXAM: CHEST FLUOROSCOPY TECHNIQUE: Real-time fluoroscopic evaluation of the chest was performed. FLUOROSCOPY TIME:  Fluoroscopy Time: 24 seconds of low-dose pulsed fluoroscopy. Radiation Exposure Index (if provided by the fluoroscopic device): 8.4 mGy Number of Acquired Spot Images: 0 COMPARISON:  Chest CT 12/23/2018. Radiographs 12/23/2018 and 12/17/2018. FINDINGS: AP and lateral fluoroscopic assessment of the diaphragm was performed with the patient erect during normal breathing and forced sniffing. Fluoro store cine loops were recorded. Both hemidiaphragms move normally. No paradoxical motion identified. IMPRESSION: Normal sniff test.  No evidence of hemidiaphragm paralysis. Electronically Signed   By: WRichardean SaleM.D.   On: 12/25/2018 08:31   [place]roid  Result Date: 01/14/2019 CLINICAL DATA:  68year old female with a history of thyroid nodule EXAM: THYROID ULTRASOUND TECHNIQUE: Ultrasound examination of the thyroid gland and adjacent soft tissues was performed. COMPARISON:  None. FINDINGS: Parenchymal Echotexture: Mildly heterogenous Isthmus: 0.3 cm Right lobe: 4.1 cm x 1.7 cm x 2.5 cm Left lobe: 4.4 cm x 1.2 cm x 1.4 cm _________________________________________________________ Estimated total number of nodules >/= 1 cm: 3 Number of spongiform nodules >/=  2 cm not described below (TR1): 0 Number of mixed cystic and solid nodules >/= 1.5 cm not described below (TR2): 0 _________________________________________________________ Nodule # 1: Location: Right; Mid Maximum size: 2.0 cm; Other 2 dimensions: 1.8 cm x 1.3 cm Composition: cannot determine (2) Echogenicity: hypoechoic (2) Shape: not taller-than-wide (0) Margins: ill-defined (0) Echogenic foci: punctate echogenic foci (3) ACR TI-RADS total points: 7. ACR TI-RADS risk category: TR5 (>/= 7 points). ACR TI-RADS recommendations: Nodule  meets criteria for biopsy _________________________________________________________ Nodule # 2: Location: Right; Inferior Maximum size: 1.4 cm; Other 2 dimensions: 1.3  cm x 0.7 cm Composition: spongiform (0) ACR TI-RADS recommendations: Spongiform nodule does not meet criteria for surveillance or biopsy _________________________________________________________ Nodule # 3: Location: Right; Inferior Maximum size: 1.1 cm; Other 2 dimensions: 1.0 cm x 0.6 cm Composition: spongiform (0) ACR TI-RADS recommendations: Spongiform nodule does not meet criteria for surveillance or biopsy _________________________________________________________ Nodule # 4: Location: Left; Superior Maximum size: 0.8 cm; Other 2 dimensions: 0.7 cm x 0.3 cm Composition: cystic/almost completely cystic (0) Echogenicity: anechoic (0) Shape: not taller-than-wide (0) Margins: smooth (0) Echogenic foci: none (0) ACR TI-RADS total points: 0. ACR TI-RADS risk category: TR1 (0-1 points). ACR TI-RADS recommendations: Cystic nodule does not meet criteria for surveillance or biopsy _________________________________________________________ No adenopathy IMPRESSION: Right mid thyroid nodule (labeled 1) meets criteria for biopsy, as designated by the newly established ACR TI-RADS criteria, and referral for biopsy is recommended. No other thyroid nodule meets criteria for biopsy or surveillance, as designated by the newly established ACR TI-RADS criteria. Recommendations follow those established by the new ACR TI-RADS criteria (J Am Coll Radiol 1779;39:030-092). Electronically Signed   By: Corrie Mckusick D.O.   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  Considerable posterior acoustical shadowing is noted and evaluation of the lumen of the gallbladder is not possible. Common bile duct: Diameter: 3.5 mm Liver: Mildly increased echogenicity likely related to fatty infiltration. No focal mass is noted. Portal vein is patent on color Doppler imaging with normal direction of blood flow towards the liver. Other: None. IMPRESSION: Calcification within the gallbladder wall with posterior shadowing limiting evaluation of the gallbladder lumen. No pericholecystic fluid is noted. The overall appearance is stable dating back to 2019. Fatty liver Electronically Signed   By: Inez Catalina M.D.   On: 12/25/2018 07:12    ASSESSMENT & PLAN:   1) H/O CML ?  PLAN:  -Discussed patient's most recent labs from 01/09/2019, all values are WNL except for RDW at 18.0, PLTs at 117K, nRBC Rel at 1, Glucose at 118.  -patient notes that she has never been on rx for her "chronic leukemia" -Will look at previous medical records to see what work-up was done  -Will get labs today  -Will see back in 3 months with labs    FOLLOW UP: Labs today RTC with Dr Irene Limbo with labs in 3 months  All of the patients questions were answered with apparent satisfaction. The patient knows to call the clinic with any problems, questions or concerns.  I spent 25 mins counseling the patient face to face. The total time spent in the appointment was 35 mins  and more than 50% was on counseling and direct patient cares.    Sullivan Lone MD [place] AAHIVMS [facility] Hematology/Oncology Physician [facility]  (Office):       [phone] (Work cell):  [phone] (Fax):           [phone]  01/20/2019 9:15 PM  I, Yevette Edwards, am acting as a scribe for Dr. Sullivan Lone.   .I have reviewed the above documentation for accuracy and completeness, and I agree with the above. Brunetta Genera MD

## 2019-01-21 ENCOUNTER — Inpatient Hospital Stay: Payer: Medicare Other | Attending: Hematology | Admitting: Hematology

## 2019-01-21 ENCOUNTER — Other Ambulatory Visit: Payer: Self-pay

## 2019-01-21 ENCOUNTER — Other Ambulatory Visit: Payer: Self-pay | Admitting: Family Medicine

## 2019-01-21 ENCOUNTER — Other Ambulatory Visit: Payer: Self-pay | Admitting: Urgent Care

## 2019-01-21 ENCOUNTER — Inpatient Hospital Stay: Payer: Medicare Other

## 2019-01-21 VITALS — BP 144/70 | HR 76 | Temp 98.2°F | Resp 22 | Ht 64.0 in | Wt 261.4 lb

## 2019-01-21 DIAGNOSIS — Z79899 Other long term (current) drug therapy: Secondary | ICD-10-CM | POA: Insufficient documentation

## 2019-01-21 DIAGNOSIS — E119 Type 2 diabetes mellitus without complications: Secondary | ICD-10-CM | POA: Insufficient documentation

## 2019-01-21 DIAGNOSIS — D696 Thrombocytopenia, unspecified: Secondary | ICD-10-CM

## 2019-01-21 DIAGNOSIS — Z856 Personal history of leukemia: Secondary | ICD-10-CM

## 2019-01-21 DIAGNOSIS — C9210 Chronic myeloid leukemia, BCR/ABL-positive, not having achieved remission: Secondary | ICD-10-CM | POA: Insufficient documentation

## 2019-01-21 DIAGNOSIS — I10 Essential (primary) hypertension: Secondary | ICD-10-CM | POA: Insufficient documentation

## 2019-01-21 DIAGNOSIS — E041 Nontoxic single thyroid nodule: Secondary | ICD-10-CM

## 2019-01-21 LAB — CBC WITH DIFFERENTIAL/PLATELET
Abs Immature Granulocytes: 0.06 10*3/uL (ref 0.00–0.07)
Basophils Absolute: 0 10*3/uL (ref 0.0–0.1)
Basophils Relative: 0 %
Eosinophils Absolute: 0 10*3/uL (ref 0.0–0.5)
Eosinophils Relative: 0 %
HCT: 36 % (ref 36.0–46.0)
Hemoglobin: 11.8 g/dL — ABNORMAL LOW (ref 12.0–15.0)
Immature Granulocytes: 1 %
Lymphocytes Relative: 29 %
Lymphs Abs: 2 10*3/uL (ref 0.7–4.0)
MCH: 31.3 pg (ref 26.0–34.0)
MCHC: 32.8 g/dL (ref 30.0–36.0)
MCV: 95.5 fL (ref 80.0–100.0)
Monocytes Absolute: 0.7 10*3/uL (ref 0.1–1.0)
Monocytes Relative: 10 %
Neutro Abs: 4.2 10*3/uL (ref 1.7–7.7)
Neutrophils Relative %: 61 %
Platelets: 109 10*3/uL — ABNORMAL LOW (ref 150–400)
RBC: 3.77 MIL/uL — ABNORMAL LOW (ref 3.87–5.11)
RDW: 21.2 % — ABNORMAL HIGH (ref 11.5–15.5)
WBC: 6.9 10*3/uL (ref 4.0–10.5)
nRBC: 0.9 % — ABNORMAL HIGH (ref 0.0–0.2)

## 2019-01-21 LAB — CMP (CANCER CENTER ONLY)
ALT: 13 U/L (ref 0–44)
AST: 16 U/L (ref 15–41)
Albumin: 4.1 g/dL (ref 3.5–5.0)
Alkaline Phosphatase: 53 U/L (ref 38–126)
Anion gap: 9 (ref 5–15)
BUN: 13 mg/dL (ref 8–23)
CO2: 26 mmol/L (ref 22–32)
Calcium: 9.7 mg/dL (ref 8.9–10.3)
Chloride: 107 mmol/L (ref 98–111)
Creatinine: 0.72 mg/dL (ref 0.44–1.00)
GFR, Est AFR Am: >60 mL/min (ref >60)
GFR, Estimated: >60 mL/min (ref >60)
Glucose, Bld: 184 mg/dL — ABNORMAL HIGH (ref 70–99)
Potassium: 4.4 mmol/L (ref 3.5–5.1)
Sodium: 142 mmol/L (ref 135–145)
Total Bilirubin: 1.1 mg/dL (ref 0.3–1.2)
Total Protein: 6.5 g/dL (ref 6.5–8.1)

## 2019-01-21 NOTE — Patient Instructions (Signed)
Thank you for choosing [facility] to provide your oncology and hematology care.   Should you have questions after your visit to the [facility]), please contact this office at [phone] between 8:30 AM and 4:30 PM. Voicemails left after 4:00 PM may not be returned until the following business day. Calls received after 4:30 PM will be answered by an off-site Nurse Triage Line.    Prescription Refills:  Please have your pharmacy contact us directly for most prescription requests.  Contact the office directly for refills of narcotics (pain medications). Allow 48-72 hours for refills.  Appointments: Please contact the [facility] scheduling department [phone] for questions regarding [facility] appointment scheduling.  Contact the schedulers with any scheduling changes so that your appointment can be rescheduled in a timely manner.   Central Scheduling for [facility] [phone] - Call to schedule procedures such as PET scans, CT scans, MRI, Ultrasound, etc.  To afford each patient quality time with our providers, please arrive 30 minutes before your scheduled appointment time.  If you arrive late for your appointment, you may be asked to reschedule.  We strive to give you quality time with our providers, and arriving late affects you and other patients whose appointments are after yours. If you are a no show for multiple scheduled visits, you may be dismissed from the clinic at the providers discretion.     Resources: [employer] [phone] for additional information on assistance programs --[place]  [phone]: Information regarding food stamps, Medicaid, and utility assistance [place] [phone]   [place] [employer]'s shared-ride transportation service for eligible riders who have a disability that prevents them from riding the fixed route bus.   [place] 604-565-0754 Helps people with  Medicare understand their rights and benefits, navigate the Medicare system, and secure the quality healthcare they deserve [employer] 6712426538 Assists patients locate various types of support and financial assistance Cancer Care: 1-800-813-HOPE (408) 276-4015) Provides financial assistance, online support groups, medication/co-pay assistance.

## 2019-01-21 NOTE — Patient Instructions (Addendum)
Thank you for visiting Dr. Tamala Julian at [facility].  It was good to see you again!  - Elizebeth Koller out course of steroids, call me if breathing gets worse - Continue to use oxygen at night and with exertion - You are okay to return to work - Flu shot today  Today we recommend the following: Orders Placed This Encounter  Procedures  . DG Chest 2 View   No orders of the defined types were placed in this encounter.  Return in about 2 months (around 03/25/2019).    Please do your part to reduce the spread of COVID-19.

## 2019-01-21 NOTE — Progress Notes (Signed)
Synopsis: Referred in Sept 2020 by Kerin Perna, NP  Subjective:   PATIENT ID: Kathy Irwin GENDER: female DOB: 01-29-1951, MRN: [id number]  Chief Complaint  Patient presents with   Follow-up    Covid back in July. She reports her breathing has been at er basline. She has been working to get her endurance up. No concerns voiced.     HPI  Here for hospitalization f/u of steroid-responsive ILD, rheum labs neg.  Hx per my previous note:  Patient with history of ILD worked up at [facility].  History of this as documented below from her pulmonologist in 2013:  "HPI: As you know, Mrs. Gehling is a 68 year old African American female who presents with dyspnea on exertion and a previously abnormal chest CT (reportedly interstitial lung disease). Her symptoms started in August 2012 when she developed severe shortness of breath (O2 sat dropped to 64%) and was hospitalized in [place] for 7 days. She also complained of a persistent cough which was mostly non-productive, but she denied fevers, chills, or sweats. She was evaluated by Dr. Priscille Loveless in the hospital and discharged on home O2 at that time. She has been followed by Dr. Priscille Loveless over the last year and she has undergone a thorough work-up multiple interventions including a cardiac echo (reportedly normal) and bronchoscopic evaluation to rule out etiologies such as sarcoidosis. The transbronchial biopsies showed only mild chronic interstitial inflammation. There were no granulomas present. She states she did try a week long course of steroids several months ago which she believes helped. When you saw her several weeks ago for consideration of a VATS wedge biopsy, you prescribed a month-long treatment with prednisone, and she has now been off steroids for the past 3 weeks. She says her breathing is much better, and she says she could walk about 80 yards without stopping compared to only being able to walk a few feet before. She is no longer wearing oxygen  while she walks, but does wear it at night. She says her spO2 drops to 89% and quickly recovers after resting. She complains of pain and stiffness in multiple joints including her knees, hands, and back. She denies rashes, but does feel a bit week when standing and when climbing stairs. She also complains of daytime sleepiness, she snores, and she wakes up tired."  She never ended up getting VATS, rheum workup unrevealing with exception of aspergillus senstivity and .  Has been off steroids it sounds like until she caught COVID 6 weeks ago.  She was never intubated for this.  Since catching COVID she has been dyspneic even with ADLs.  Has also had cough productive of white sputum.  CTA showed no PE but did show NSIP changes for which pulmonology is consulted.    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  Also noted to have signs of OHS on labs and history.  Abnormal L hemidiaphragm elevation but negative sniff.  - Sent home with 4 week steroid taper. - Doing well, still has another 2 weeks of 68m prednisone daily to take - MMRC 1 dyspnea - Using 3L with exertion and at night to good effect - Sats 90% on RA in office at rest  Past Medical History:  Diagnosis Date   Ac[place] respiratory [place] with hypoxia [place] 12/[place]   Di[place] mellitus without complication [place]    Hypersensitivity pneumonitis [place] 12/19/2017   Hypertension    ILD (interstitial lung disease) ([facility] 12/24/2018     Family History  Problem Relation Age of Onset   Diabetes Other    Hypertension Other      Past Surgical History:  Procedure Laterality Date   ABDOMINAL HYSTERECTOMY      Social History   Socioeconomic History   Marital status: Married    Spouse name: Not on file   Number of children: Not on file   Years of education: Not on file   Highest education level: Not on file  Occupational History   Not on file  Social Needs   Financial resource strain: Not on file   Food insecurity     Worry: Not on file    Inability: Not on file   Transportation needs    Medical: Not on file    Non-medical: Not on file  Tobacco Use   Smoking status: Never Smoker   Smokeless tobacco: Never Used  Substance and Sexual Activity   Alcohol use: Never    Frequency: Never   Drug use: Never   Sexual activity: Not on file  Lifestyle   Physical activity    Days per week: Not on file    Minutes per session: Not on file   Stress: Not on file  Relationships   Social connections    Talks on phone: Not on file    Gets together: Not on file    Attends religious service: Not on file    Active member of club or organization: Not on file    Attends meetings of clubs or organizations: Not on file    Relationship status: Not on file   Intimate partner violence    Fear of current or ex partner: Not on file    Emotionally abused: Not on file    Physically abused: Not on file    Forced sexual activity: Not on file  Other Topics Concern   Not on file  Social History Narrative   Not on file     Allergies  Allergen Reactions   Other Shortness Of Breath and Other (See Comments)    Newspaper ink =  new chest pain, also   Iodine Other (See Comments)    "Was a long time ago" (Reaction??)   Merbromin Other (See Comments)    Mercurochrome- "Was a long time ago" (Reaction??)     Outpatient Medications Prior to Visit  Medication Sig Dispense Refill   acetaminophen (TYLENOL) 500 MG tablet Take 1,000 mg by mouth 2 (two) times daily as needed (for pain).     albuterol (PROAIR HFA) 108 (90 Base) MCG/ACT inhaler Inhale 2 puffs into the lungs every 6 (six) hours as needed for wheezing or shortness of breath. 6.7 g 1   albuterol (PROVENTIL) (2.5 MG/3ML) 0.083% nebulizer solution Take 3 mLs (2.5 mg total) by nebulization every 6 (six) hours as needed for wheezing or shortness of breath. 75 mL 1   atorvastatin (LIPITOR) 20 MG tablet Take 1 tablet (20 mg total) by mouth at bedtime. 30  tablet 3   Azelastine-Fluticasone (DYMISTA NA) Place 1-2 sprays into both nostrils daily as needed (for allergies).      blood glucose meter kit and supplies KIT Dispense based on patient and insurance preference. Use up to four times daily as directed. (FOR ICD-9 250.00, 250.01). For QAC - HS accuchecks. 1 each 1   diphenhydrAMINE (BENADRYL) 50 MG tablet Take 0.5 tablets (25 mg total) by mouth at bedtime as needed for itching. 30 tablet 0   FLUoxetine (PROZAC) 20 MG tablet TAKE 1 TABLET BY  MOUTH EVERY DAY 90 tablet 2   furosemide (LASIX) 40 MG tablet Take 1 tablet (40 mg total) by mouth daily. 30 tablet 0   gabapentin (NEURONTIN) 300 MG capsule Take 1 capsule (300 mg total) by mouth 2 (two) times daily. 60 capsule 3   glimepiride (AMARYL) 2 MG tablet Take 1 tablet (2 mg total) by mouth daily with breakfast. 30 tablet 3   glucose blood (FREESTYLE LITE) test strip For glucose testing every before meals at bedtime. Diagnosis E 11.65  Can substitute to any accepted brand 100 each 0   Insulin Lispro Prot & Lispro (HUMALOG MIX 75/25 KWIKPEN) (75-25) 100 UNIT/ML Kwikpen Inject 80 Units into the skin See admin instructions. Inject 50 units into the skin before breakfast "and a second dose at bedtime 30 units 30 mL 3   Melatonin 5 MG CAPS Take 1 capsule by mouth daily as needed (For sleep).     metoprolol (TOPROL-XL) 200 MG 24 hr tablet Take 1 tablet (200 mg total) by mouth 2 (two) times daily. 60 tablet 3   omeprazole (PRILOSEC) 20 MG capsule Take 1 capsule (20 mg total) by mouth daily. 30 capsule 0   pravastatin (PRAVACHOL) 10 MG tablet Take 1 tablet (10 mg total) by mouth daily. 30 tablet 3   predniSONE (DELTASONE) 20 MG tablet Take 2 tablets daily (total 40 mg) for 2 weeks,  then 1 tablet daily (total 57m) for 2 weeks then half tablet daily (10 mg total) for 2 weeks. 50 tablet 0   sertraline (ZOLOFT) 50 MG tablet Take 1 tablet (50 mg total) by mouth every evening. 30 tablet 3   VITAMIN  D PO Take 1 tablet by mouth daily.     No facility-administered medications prior to visit.      Positive Symptoms in bold:  Constitutional fevers, chills, weight loss, fatigue, anorexia, malaise  Eyes decreased vision, double vision, eye irritation  Ears, Nose, Mouth, Throat sore throat, trouble swallowing, sinus congestion  Cardiovascular chest pain, paroxysmal nocturnal dyspnea, lower ext edema, palpitations   Respiratory SOB, cough, DOE, hemoptysis, wheezing  Gastrointestinal nausea, vomiting, diarrhea  Genitourinary burning with urination, trouble urinating  Musculoskeletal joint aches, joint swelling, back pain  Integumentary  rashes, skin lesions  Neurological focal weakness, focal numbness, trouble speaking, headaches  Psychiatric depression, anxiety, confusion  Endocrine polyuria, polydipsia, cold intolerance, heat intolerance  Hematologic abnormal bruising, abnormal bleeding, unexplained nose bleeds  Allergic/Immunologic recurrent infections, hives, swollen lymph nodes      Objective:  GEN: obese woman in NAD HEENT: MMM, no thrush CV: RRR, ext warm PULM: Clear, no accessory muscle use GI: Soft, +BS EXT: No edema NEURO: Moves all 4 ext to command PSYCH: AOx3, excellent insight SKIN: No rashes    Vitals:   01/23/19 0918  BP: 132/88  Pulse: 83  Temp: (!) 97.3 F (36.3 C)  TempSrc: Temporal  SpO2: 90%  Weight: 262 lb 3.2 oz (118.9 kg)  Height: 5' 4.5" (1.638 m)   90% on RA BMI Readings from Last 3 Encounters:  01/23/19 44.31 kg/m  01/21/19 44.87 kg/m  01/09/19 44.73 kg/m   Wt Readings from Last 3 Encounters:  01/23/19 262 lb 3.2 oz (118.9 kg)  01/21/19 261 lb 6.4 oz (118.6 kg)  01/09/19 260 lb 9.6 oz (118.2 kg)     CBC    Component Value Date/Time   WBC 6.9 01/21/2019 1255   RBC 3.77 (L) 01/21/2019 1255   HGB 11.8 (L) 01/21/2019 1255   HGB 12.7  01/09/2019 1518   HCT 36.0 01/21/2019 1255   HCT 37.0 01/09/2019 1518   PLT 109 (L)  01/21/2019 1255   PLT 117 (L) 01/09/2019 1518   MCV 95.5 01/21/2019 1255   MCV 92 01/09/2019 1518   MCH 31.3 01/21/2019 1255   MCHC 32.8 01/21/2019 1255   RDW 21.2 (H) 01/21/2019 1255   RDW 18.0 (H) 01/09/2019 1518   LYMPHSABS 2.0 01/21/2019 1255   LYMPHSABS 1.6 01/09/2019 1518   MONOABS 0.7 01/21/2019 1255   EOSABS 0.0 01/21/2019 1255   EOSABS 0.0 01/09/2019 1518   BASOSABS 0.0 01/21/2019 1255   BASOSABS 0.0 01/09/2019 1518     Chest Imaging: Sept 2020 IMPRESSION: 1. No evidence of thoracic aortic aneurysm or dissection. 2. No evidence of central pulmonary embolus. The segmental and subsegmental branches are suboptimally visualized. 3. Hazy interstitial and airspace opacities throughout both lungs with mosaic distribution and dependent predominance. Differential diagnosis includes pulmonary edema, atypical multifocal pneumonia, or interstitial lung disease. 4. Peripheral calcifications within the gallbladder. This may represent cholelithiasis, however gallbladder calcifications are also a consideration. Further evaluation with right upper quadrant ultrasound or nonemergent abdominal MRI may be considered. 5. 1.6 cm right thyroid hypoattenuated nodule. Further evaluation with thyroid ultrasound may be considered.  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  Today she desaturated quickly to low 80s on RA with exertion, 3L kept her at 92%. 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, Disp: , Rfl:    albuterol (PROAIR HFA) 108 (90 Base) MCG/ACT inhaler, Inhale 2 puffs into the lungs every 6 (six) hours as needed for wheezing or shortness of breath., Disp: 6.7 g, Rfl: 1   albuterol (PROVENTIL) (2.5 MG/3ML) 0.083% nebulizer solution, Take 3 mLs (2.5 mg total) by nebulization every 6 (six) hours as needed for wheezing or shortness of breath., Disp: 75 mL, Rfl: 1   atorvastatin (LIPITOR) 20 MG tablet, Take 1 tablet (20 mg total) by mouth at bedtime., Disp: 30 tablet, Rfl: 3   Azelastine-Fluticasone (DYMISTA NA), Place 1-2 sprays into both nostrils daily as needed (for allergies). , Disp: , Rfl:    blood glucose meter kit and supplies KIT, Dispense based on patient and insurance preference. Use up to four times daily as directed. (FOR ICD-9 250.00, 250.01). For QAC - HS accuchecks., Disp: 1 each, Rfl: 1  diphenhydrAMINE (BENADRYL) 50 MG tablet, Take 0.5 tablets (25 mg total) by mouth at bedtime as needed for itching., Disp: 30 tablet, Rfl: 0   FLUoxetine (PROZAC) 20 MG tablet, TAKE 1 TABLET BY MOUTH EVERY DAY, Disp: 90 tablet, Rfl: 2   furosemide (LASIX) 40 MG tablet, Take 1 tablet (40 mg total) by mouth daily., Disp: 30 tablet, Rfl: 0   gabapentin (NEURONTIN) 300 MG capsule, Take 1 capsule (300 mg total) by mouth 2 (two) times daily.,  Disp: 60 capsule, Rfl: 3   glimepiride (AMARYL) 2 MG tablet, Take 1 tablet (2 mg total) by mouth daily with breakfast., Disp: 30 tablet, Rfl: 3   glucose blood (FREESTYLE LITE) test strip, For glucose testing every before meals at bedtime. Diagnosis E 11.65  Can substitute to any accepted brand, Disp: 100 each, Rfl: 0   Insulin Lispro Prot & Lispro (HUMALOG MIX 75/25 KWIKPEN) (75-25) 100 UNIT/ML Kwikpen, Inject 80 Units into the skin See admin instructions. Inject 50 units into the skin before breakfast "and a second dose at bedtime 30 units, Disp: 30 mL, Rfl: 3   Melatonin 5 MG CAPS, Take 1 capsule by mouth daily as needed (For sleep)., Disp: , Rfl:    metoprolol (TOPROL-XL) 200 MG 24 hr tablet, Take 1 tablet (200 mg total) by mouth 2 (two) times daily., Disp: 60 tablet, Rfl: 3   omeprazole (PRILOSEC) 20 MG capsule, Take 1 capsule (20 mg total) by mouth daily., Disp: 30 capsule, Rfl: 0   pravastatin (PRAVACHOL) 10 MG tablet, Take 1 tablet (10 mg total) by mouth daily., Disp: 30 tablet, Rfl: 3   predniSONE (DELTASONE) 20 MG tablet, Take 2 tablets daily (total 40 mg) for 2 weeks,  then 1 tablet daily (total 46m) for 2 weeks then half tablet daily (10 mg total) for 2 weeks., Disp: 50 tablet, Rfl: 0   sertraline (ZOLOFT) 50 MG tablet, Take 1 tablet (50 mg total) by mouth every evening., Disp: 30 tablet, Rfl: 3   VITAMIN D PO, Take 1 tablet by mouth daily., Disp: , Rfl:    DCandee Furbish MD [facility] 01/23/2019 9:32 AM

## 2019-01-22 ENCOUNTER — Telehealth: Payer: Self-pay | Admitting: Hematology

## 2019-01-22 NOTE — Telephone Encounter (Signed)
Scheduled appt per 10/6 los.  Spoke with patient and he is aware of the appt date and time.

## 2019-01-23 ENCOUNTER — Other Ambulatory Visit: Payer: Self-pay

## 2019-01-23 ENCOUNTER — Ambulatory Visit: Payer: Medicare Other | Admitting: Internal Medicine

## 2019-01-23 ENCOUNTER — Encounter: Payer: Self-pay | Admitting: Internal Medicine

## 2019-01-23 VITALS — BP 132/88 | HR 83 | Temp 97.3°F | Ht 64.5 in | Wt 262.2 lb

## 2019-01-23 DIAGNOSIS — J849 Interstitial pulmonary disease, unspecified: Secondary | ICD-10-CM | POA: Diagnosis not present

## 2019-01-23 DIAGNOSIS — Z23 Encounter for immunization: Secondary | ICD-10-CM

## 2019-01-23 DIAGNOSIS — I700 Atherosclerosis of aorta: Secondary | ICD-10-CM | POA: Diagnosis not present

## 2019-02-04 ENCOUNTER — Ambulatory Visit (INDEPENDENT_AMBULATORY_CARE_PROVIDER_SITE_OTHER): Payer: Medicare Other | Admitting: Primary Care

## 2019-02-04 ENCOUNTER — Other Ambulatory Visit: Payer: Self-pay

## 2019-02-04 DIAGNOSIS — J849 Interstitial pulmonary disease, unspecified: Secondary | ICD-10-CM

## 2019-02-04 LAB — BCR ABL1 FISH (GENPATH)

## 2019-02-04 MED ORDER — GUAIFENESIN ER 600 MG PO TB12: 600.0000 mg | ORAL_TABLET | Freq: Two times a day (BID) | ORAL | 0 refills | Status: DC

## 2019-02-04 NOTE — Progress Notes (Signed)
Virtual Visit via Telephone Note  I connected with Kathy Irwin on 02/04/19 at  1:30 PM EDT by telephone and verified that I am speaking with the correct person using two identifiers.  Location: Patient: Home Provider: Office   I discussed the limitations, risks, security and privacy concerns of performing an evaluation and management service by telephone and the availability of in person appointments. I also discussed with the patient that there may be a patient responsible charge related to this service. The patient expressed understanding and agreed to proceed.   History of Present Illness: 68 year old female, never smoked. PMH significant for ILD, NSIP, acute respiratory failure with hypoxia, pneumonia d/t COVID-19, obesity hypoventilation, chronic myeloid leukemia, DM, CHF, HTN. Patient of Dr. Tamala Julian, seen for initial consult 01/23/19. Diagnosed with COVID-19 illness end of July-August, discharged with 4 week steroid taper. Using 3L on exertion and at night. Consider need for DMARDs and PSG at next follow-up with Dr. Tamala Julian.   02/04/2019 Patient contacted today for televisit with reports of increased chest congestion and decreased oxygen saturation. Cough is productive with clear congestion. Shortness of breath is unchanged from pulmonary consult. Occasionally her O2 level on 3L of oxygen drops to 80-88%. She has not received portable oxygen concentrator yet. Today she was working with physical therapist and her oxygen level was 93% on RA at rest. She continues using oxygen at night when she goes to bed. Reports that she had a sleep study last year in [place]. She has three days of prednisone course left. Eating and drinking normally. Up to date with influenza vaccine Denies fever, chills, chest pain.    Observations/Objective:  - No significant shortness of breath, wheezing or cough noted   Significant testing reviewed: Imaging 12/25/18- Normal SNIFF test, no evidence of  hemidiaphragm paralysis  12/23/18 CTA- No evidence of thoracic aortic aneurysm/dissection or central pulmonary embolus. Hazy interstitial and airspace opacities throughout both lungs with mosaic distribution and dependent predominance. Differential diagnosis includes pulmonary edema, atypical multifocal pneumonia, or interstitial lung disease.  12/23/18 CXR- Improving bibasilar airspace opacities  11/15/18 CXR- Prominent interstitial lung markings bilaterally and a few ground glass airspace opacities concerning for atypical infection process versus pulmonary edema   PFT  2013- FVC 2.01 (76%), FEV1 1.81 (87%), ratio 90, DLCOunc 34% Suggestive of restriction and severe diffusion defect   Assessment and Plan:  ILD/NSIP: - Acute exacerbation d.t COVID-19 illness  - Finish prednisone taper as directed on 10/23 - Will discuss potential DMARDs therapy with Dr. Tamala Julian   Acute on chronic respiratory failure with hypoxia - Intermittent oxygen desaturations on 3L but quickly recovers - Continue 3L oxygen on exertion and nocturnally - If O2 sustaining <88% increased to 4L  Cough: - Advise mucinex 612m twice daily - Add flutter valve three times daily for congestion  Follow Up Instructions:   - December 4th with Dr. STamala Julian I discussed the assessment and treatment plan with the patient. The patient was provided an opportunity to ask questions and all were answered. The patient agreed with the plan and demonstrated an understanding of the instructions.   The patient was advised to call back or seek an in-person evaluation if the symptoms worsen or if the condition fails to improve as anticipated.  I provided 22 minutes of non-face-to-face time during this encounter.   EMartyn Ehrich NP

## 2019-02-04 NOTE — Patient Instructions (Addendum)
Pleasure speaking with you today  Orders: - Needs flutter valve  Recommendations: - Use flutter valve three times a day for congestion - Take mucinex twice daily with full glass of water  ILD: - Finish prednisone taper as directed (if you develop acutely worsening shortness of breath off prednisone let us know) - I will speak with Dr. Tamala Julian about DMARD (disease modifying antirheumatic drug)  Oxygen: - Continue to use 3L on exertion and at bedtime - If oxygen level sustaining in 80s or persistently dropping increase oxygen to 4L   Follow-up - Dec 4th 9am with Dr. Tamala Julian

## 2019-02-07 ENCOUNTER — Telehealth: Payer: Self-pay

## 2019-02-07 DIAGNOSIS — J849 Interstitial pulmonary disease, unspecified: Secondary | ICD-10-CM

## 2019-02-07 NOTE — Telephone Encounter (Signed)
-----  Message from Marshell Garfinkel, MD sent at 02/06/2019  6:21 AM EDT ----- Regarding: RE: Can we get a HRCT before clinic visit? Thanks ----- Message ----- From: Candee Furbish, MD Sent: 02/04/2019   4:14 PM EDT To: Marshell Garfinkel, MD, Martyn Ehrich, NP Subject: RE:                                            We can consider cellcept or whatever flavor this office likes. Praveen would you be willing to get ball rolling on this? Don't think I have any clinic for a while unfortunately.  Dan ----- Message ----- From: Martyn Ehrich, NP Sent: 02/04/2019   2:31 PM EDT To: Candee Furbish, MD  Reports increased congestion and oxygen desaturation on 3L. She does not sound any worse from your consult 2 weeks ago. Three days left of prednisone. She was asking about other treatment options you mentioned if fail steroid treatment. I believe she was referring to DMARDs. Has fu with you early Dec.

## 2019-02-07 NOTE — Telephone Encounter (Signed)
HRCT per Dr. Vaughan Browner prior to visit on 03/03/19.

## 2019-02-11 ENCOUNTER — Ambulatory Visit (INDEPENDENT_AMBULATORY_CARE_PROVIDER_SITE_OTHER)
Admission: RE | Admit: 2019-02-11 | Discharge: 2019-02-11 | Disposition: A | Discharge status: Home / Self Care | Payer: Medicare Other | Source: Ambulatory Visit | Attending: Pulmonary Disease | Admitting: Pulmonary Disease

## 2019-02-11 ENCOUNTER — Other Ambulatory Visit: Payer: Self-pay

## 2019-02-11 DIAGNOSIS — J849 Interstitial pulmonary disease, unspecified: Secondary | ICD-10-CM

## 2019-02-23 ENCOUNTER — Other Ambulatory Visit (INDEPENDENT_AMBULATORY_CARE_PROVIDER_SITE_OTHER): Payer: Self-pay | Admitting: Primary Care

## 2019-02-24 NOTE — Telephone Encounter (Signed)
FWD to PCP. Nat Christen, CMA

## 2019-02-25 ENCOUNTER — Ambulatory Visit
Admission: RE | Admit: 2019-02-25 | Discharge: 2019-02-25 | Disposition: A | Discharge status: Home / Self Care | Payer: Medicare Other | Source: Ambulatory Visit | Attending: Family Medicine | Admitting: Family Medicine

## 2019-02-25 ENCOUNTER — Other Ambulatory Visit (HOSPITAL_COMMUNITY)
Admission: RE | Admit: 2019-02-25 | Discharge: 2019-02-25 | Disposition: A | Discharge status: Home / Self Care | Payer: Medicare Other | Source: Ambulatory Visit | Attending: Family Medicine | Admitting: Family Medicine

## 2019-02-25 DIAGNOSIS — E041 Nontoxic single thyroid nodule: Secondary | ICD-10-CM | POA: Diagnosis present

## 2019-02-25 DIAGNOSIS — D440 Neoplasm of uncertain behavior of thyroid gland: Secondary | ICD-10-CM | POA: Diagnosis not present

## 2019-02-26 LAB — CYTOLOGY - NON PAP

## 2019-03-03 ENCOUNTER — Other Ambulatory Visit: Payer: Self-pay

## 2019-03-03 ENCOUNTER — Ambulatory Visit: Payer: Medicare Other | Admitting: Pulmonary Disease

## 2019-03-03 ENCOUNTER — Encounter: Payer: Self-pay | Admitting: Pulmonary Disease

## 2019-03-03 VITALS — BP 130/76 | HR 85 | Temp 97.4°F | Ht 64.5 in | Wt 271.0 lb

## 2019-03-03 DIAGNOSIS — J849 Interstitial pulmonary disease, unspecified: Secondary | ICD-10-CM

## 2019-03-03 LAB — CBC WITH DIFFERENTIAL/PLATELET
Basophils Absolute: 0 10*3/uL (ref 0.0–0.1)
Basophils Relative: 0.7 % (ref 0.0–3.0)
Eosinophils Absolute: 0 10*3/uL (ref 0.0–0.7)
Eosinophils Relative: 0.1 % (ref 0.0–5.0)
HCT: 41.9 % (ref 36.0–46.0)
Hemoglobin: 13.3 g/dL (ref 12.0–15.0)
Lymphocytes Relative: 58.9 % — ABNORMAL HIGH (ref 12.0–46.0)
Lymphs Abs: 2.6 10*3/uL (ref 0.7–4.0)
MCHC: 31.9 g/dL (ref 30.0–36.0)
MCV: 101.1 fl — ABNORMAL HIGH (ref 78.0–100.0)
Monocytes Absolute: 0.7 10*3/uL (ref 0.1–1.0)
Monocytes Relative: 15.3 % — ABNORMAL HIGH (ref 3.0–12.0)
Neutro Abs: 1.1 10*3/uL — ABNORMAL LOW (ref 1.4–7.7)
Neutrophils Relative %: 25 % — ABNORMAL LOW (ref 43.0–77.0)
Platelets: 87 10*3/uL — ABNORMAL LOW (ref 150.0–400.0)
RBC: 4.14 Mil/uL (ref 3.87–5.11)
RDW: 21.5 % — ABNORMAL HIGH (ref 11.5–15.5)
WBC: 4.4 10*3/uL (ref 4.0–10.5)

## 2019-03-03 LAB — COMPREHENSIVE METABOLIC PANEL
ALT: 11 U/L (ref 0–35)
AST: 18 U/L (ref 0–37)
Albumin: 4.3 g/dL (ref 3.5–5.2)
Alkaline Phosphatase: 80 U/L (ref 39–117)
BUN: 7 mg/dL (ref 6–23)
CO2: 29 mEq/L (ref 19–32)
Calcium: 9.9 mg/dL (ref 8.4–10.5)
Chloride: 105 mEq/L (ref 96–112)
Creatinine, Ser: 0.58 mg/dL (ref 0.40–1.20)
GFR: 124.83 mL/min (ref >60.00)
Glucose, Bld: 150 mg/dL — ABNORMAL HIGH (ref 70–99)
Potassium: 4.3 mEq/L (ref 3.5–5.1)
Sodium: 142 mEq/L (ref 135–145)
Total Bilirubin: 1.2 mg/dL (ref 0.2–1.2)
Total Protein: 7 g/dL (ref 6.0–8.3)

## 2019-03-03 LAB — BRAIN NATRIURETIC PEPTIDE: Pro B Natriuretic peptide (BNP): 70 pg/mL (ref 0.0–100.0)

## 2019-03-03 MED ORDER — FUROSEMIDE 40 MG PO TABS: 40.0000 mg | ORAL_TABLET | Freq: Every day | ORAL | 3 refills | Status: DC

## 2019-03-03 MED ORDER — PREDNISONE 20 MG PO TABS: 40.0000 mg | ORAL_TABLET | Freq: Every day | ORAL | 1 refills | Status: DC

## 2019-03-03 NOTE — Progress Notes (Addendum)
Kathy Irwin    [id number]    09/26/1950  Primary Care Physician:Edwards, Milford Cage, NP  Referring Physician: Kerin Perna, NP [address] 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  Her primary pulmonologist is Dr. Priscille Loveless at [facility].  Work-up included normal echocardiogram, bronchoscopy with transbronchial biopsy showing mild chronic interstitial inflammation with no granulomas.   She is also evaluated at [facility] in [place] where she was referred to cardiothoracic surgery in 2013 for VATS wedge biopsy however since symptoms responded well to prednisone this was deferred.  Work-up in 2013 showed positive hypersensitivity panel for Aspergillus and aureobacterium pullulans, borderline elevated rheumatoid factor at 52  Not clear what the rest of her work-up or treatment has been from 2013 to now but she had close follow-up with Dr. Priscille Loveless.  History notable for hospitalization at [facility] for acute respiratory failure with pulmonary infiltrates in September 2019 when she was visiting family in [place].  Treated with short course of steroids, bronchodilators  She moved to [facility] in July 2020 to be closer to her children. Hospitalized for COVID-19 in September 2020.  Treated with steroids, remdesivir and Actemra x2.  She was hospitalized again in September 2020 with hypoxic respiratory failure secondary to ILD flare, acute diastolic heart failure.  Discharged on a slow prednisone taper over 6 weeks.  As she is, of the prednisone her dyspnea is increased again with increasing lower extremity edema.  Apparently she has been of Lasix for several weeks.  Pets: No pets, cats, birds, farm animals Occupation: Used to run a daycare center  Exposures: May have mold in the bathroom in her prior drilling.  No ongoing exposures.  No hot tub, Jacuzzi.  No feather pillows or comforters Smoking history: Never smoker Travel history: Has not lived outside [place].  No significant recent travel Relevant family history: No significant family history of lung disease  Outpatient Encounter Medications as of 03/03/2019  Medication Sig  . acetaminophen (TYLENOL) 500 MG tablet Take 1,000 mg by mouth 2 (two) times daily as needed (for pain).  Marland Kitchen albuterol (PROAIR HFA) 108 (90 Base) MCG/ACT inhaler Inhale 2 puffs into the lungs every 6 (six) hours as needed for wheezing or shortness of breath.  Marland Kitchen albuterol (PROVENTIL) (2.5 MG/3ML) 0.083% nebulizer solution Take 3 mLs (2.5 mg total) by nebulization every 6 (six) hours as needed for wheezing or shortness of breath.  Marland Kitchen atorvastatin (LIPITOR) 20 MG tablet Take 1 tablet (20 mg total) by mouth at bedtime.  . Azelastine-Fluticasone (DYMISTA NA) Place 1-2 sprays into both nostrils daily as needed (for allergies).   . blood glucose meter kit and supplies KIT Dispense based on patient and insurance preference. Use up to four times daily as directed. (FOR ICD-9 250.00, 250.01). For QAC - HS accuchecks.  Marland Kitchen FLUoxetine (PROZAC) 20 MG tablet TAKE 1 TABLET BY MOUTH EVERY DAY  . gabapentin (NEURONTIN) 300 MG capsule Take 1 capsule (300 mg total) by mouth 2 (two) times daily.  Marland Kitchen glimepiride (AMARYL) 2 MG tablet Take 1 tablet (2 mg total) by mouth daily with breakfast.  . glucose blood (FREESTYLE LITE) test strip For glucose testing every before meals at bedtime. Diagnosis E 11.65  Can substitute to any accepted brand  . guaiFENesin (MUCINEX) 600  MG 12 hr tablet Take 1 tablet (600 mg total) by mouth 2 (two) times daily.  . Insulin Lispro Prot & Lispro (HUMALOG MIX 75/25 KWIKPEN) (75-25) 100 UNIT/ML Kwikpen Inject 80 Units into the skin See admin instructions. Inject 50 units into the skin before breakfast "and a  second dose at bedtime 30 units  . Melatonin 1 MG TABS TAKE 5 TABLETS (5 MG TOTAL) BY MOUTH AT BEDTIME AS NEEDED.  . metoprolol (TOPROL-XL) 200 MG 24 hr tablet Take 1 tablet (200 mg total) by mouth 2 (two) times daily.  Marland Kitchen omeprazole (PRILOSEC) 20 MG capsule Take 1 capsule (20 mg total) by mouth daily.  . pravastatin (PRAVACHOL) 10 MG tablet Take 1 tablet (10 mg total) by mouth daily.  . sertraline (ZOLOFT) 50 MG tablet Take 1 tablet (50 mg total) by mouth every evening.  Marland Kitchen VITAMIN D PO Take 1 tablet by mouth daily.  . furosemide (LASIX) 40 MG tablet Take 1 tablet (40 mg total) by mouth daily.  . [DISCONTINUED] predniSONE (DELTASONE) 20 MG tablet Take 2 tablets daily (total 40 mg) for 2 weeks,  then 1 tablet daily (total 60m) for 2 weeks then half tablet daily (10 mg total) for 2 weeks.   No facility-administered encounter medications on file as of 03/03/2019.     Allergies as of 03/03/2019 - Review Complete 03/03/2019  Allergen Reaction Noted  . Other Shortness Of Breath and Other (See Comments) 12/17/2017  . Iodine Other (See Comments) 01/10/2008  . Merbromin Other (See Comments) 12/17/2017    Past Medical History:  Diagnosis Date  . Acute respiratory failure with hypoxia ([place] 12/2018  . Diabetes mellitus without complication ([facility]   . Hypersensitivity pneumonitis ([facility] 12/19/2017  . Hypertension   . ILD (interstitial lung disease) ([facility] 12/24/2018    Past Surgical History:  Procedure Laterality Date  . ABDOMINAL HYSTERECTOMY      Family History  Problem Relation Age of Onset  . Diabetes Other   . Hypertension Other     Social History   Socioeconomic History  . Marital status: Married    Spouse name: Not on file  . Number of children: Not on file  . Years of education: Not on file  . Highest education level: Not on file  Occupational History  . Not on file  Social Needs  . Financial resource strain: Not on file  . Food insecurity    Worry: Not on file     Inability: Not on file  . Transportation needs    Medical: Not on file    Non-medical: Not on file  Tobacco Use  . Smoking status: Never Smoker  . Smokeless tobacco: Never Used  Substance and Sexual Activity  . Alcohol use: Never    Frequency: Never  . Drug use: Never  . Sexual activity: Not on file  Lifestyle  . Physical activity    Days per week: Not on file    Minutes per session: Not on file  . Stress: Not on file  Relationships  . Social [place] phone: Not on file    Gets together: Not on file    Attends religious service: Not on file    Active member of club or organization: Not on file    Attends meetings of clubs or organizations: Not on file    Relationship status: Not on file  . Intimate partner violence    Fear of current or ex partner: Not on file  Emotionally abused: Not on file    Physically abused: Not on file    Forced sexual activity: Not on file  Other Topics Concern  . Not on file  Social History Narrative  . Not on file    Review of systems: Review of Systems  Constitutional: Negative for fever and chills.  HENT: Negative.   Eyes: Negative for blurred vision.  Respiratory: as per HPI  Cardiovascular: Negative for chest pain and palpitations.  Gastrointestinal: Negative for vomiting, diarrhea, blood per rectum. Genitourinary: Negative for dysuria, urgency, frequency and hematuria.  Musculoskeletal: Negative for myalgias, back pain and joint pain.  Skin: Negative for itching and rash.  Neurological: Negative for dizziness, tremors, focal weakness, seizures and loss of consciousness.  Endo/Heme/Allergies: Negative for environmental allergies.  Psychiatric/Behavioral: Negative for depression, suicidal ideas and hallucinations.  All other systems reviewed and are negative.  Physical Exam: Blood pressure 130/76, pulse 85, temperature (!) 97.4 F (36.3 C), temperature source Temporal, height 5' 4.5" (1.638 m), weight 271 lb (122.9  kg), SpO2 93 %. Gen:      No acute distress, obese HEENT:  EOMI, sclera anicteric Neck:     No masses; no thyromegaly Lungs:    Bilateral crackles CV:         Regular rate and rhythm; no murmurs Abd:      + bowel sounds; soft, non-tender; no palpable masses, no distension Ext:    1-2+ edema; adequate peripheral perfusion Skin:      Warm and dry; no rash Neuro: alert and oriented x 3 Psych: normal mood and affect  Data Reviewed: Imaging: High-resolution CT 02/11/2019-patchy areas of groundglass attenuation, septal thickening bilaterally with no basal gradient.  No traction bronchiectasis or honeycombing.  Moderate air trapping.  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  Labs: Work-up at [facility] 2013-rheumatoid factor 52, hypersensitivity panel positive for Aspergillus fumigatus, aureobacterium pullulans Negative aldolase, ANA, ENA, SCL 70, Smith, CCP, RNP, SSA  Cardiac: Echocardiogram 12/24/2018 LVEF 60-65%, mild aortic wall stenosis.  Normal RV systolic function.  PA systolic pressure 44  Assessment:  Interstitial lung disease She has baseline interstitial lung disease of unclear etiology that is steroid responsive.  Possible hypersensitivity pneumonitis.  She recently moved from [facility] in July 2020 and is living in a new house with no apparent ongoing exposures  Clinical picture is complicated by recent QQPYP-95 pneumonia which may have exacerbated her interstitial lung disease. Continues to have significant dyspnea, O2 requirements and lower extremity edema which is likely secondary to heart failure  We will restart on Lasix as she appears volume overloaded, restart prednisone as her breathing has worsened after she got off the taper Recheck serologies for CTD I will get records from her prior pulmonologist to see if her ILD has been stable before presentation at [facility]. Can consider surgical lung biopsy but I am not sure if she will be able to tolerate present condition  Reassess in 2  weeks to review data and decide empiric treatment with CellCept versus biopsy if her respiratory status is un improved.  Plan/Recommendations: - Lasix 40 mg a day - Check CTD serologies, BNP, metabolic panel, CBC - Start prednisone 40 mg a day - Get records from pulmonologist.  Marshell Garfinkel MD [facility] 03/03/2019, 10:57 AM  CC: Kerin Perna, NP

## 2019-03-03 NOTE — Patient Instructions (Addendum)
We will check some blood test including ILD panel today, CBC, comprehensive metabolic panel, BNP We will start you on Lasix 40 mg a day and prednisone 40 mg a day I will get records from Dr. Priscille Loveless at [facility] Based on these results we will decide on empirically treating you with CellCept or going for lung biopsy.  Follow-up in 2 weeks on December 4.  We will switch your appointment from Dr. Tamala Julian to me

## 2019-03-05 LAB — ANA,IFA RA DIAG PNL W/RFLX TIT/PATN
Anti Nuclear Antibody (ANA): NEGATIVE
Cyclic Citrullin Peptide Ab: <16 UNITS
Rheumatoid fact SerPl-aCnc: 25 IU/mL — ABNORMAL HIGH (ref <14)

## 2019-03-05 LAB — ANCA SCREEN W REFLEX TITER: ANCA Screen: NEGATIVE

## 2019-03-05 LAB — SJOGREN'S SYNDROME ANTIBODS(SSA + SSB)
SSA (Ro) (ENA) Antibody, IgG: <1 AI
SSB (La) (ENA) Antibody, IgG: <1 AI

## 2019-03-05 LAB — ANTI-SCLERODERMA ANTIBODY: Scleroderma (Scl-70) (ENA) Antibody, IgG: <1 AI

## 2019-03-07 LAB — HYPERSENSITIVITY PNEUMONITIS
A. Pullulans Abs: NEGATIVE
A.Fumigatus #1 Abs: NEGATIVE
Micropolyspora faeni, IgG: NEGATIVE
Pigeon Serum Abs: NEGATIVE
Thermoact. Saccharii: NEGATIVE
Thermoactinomyces vulgaris, IgG: NEGATIVE

## 2019-03-17 LAB — MYOMARKER 3 PLUS PROFILE (RDL)

## 2019-03-21 ENCOUNTER — Ambulatory Visit: Payer: Medicare Other | Admitting: Internal Medicine

## 2019-03-21 ENCOUNTER — Encounter (HOSPITAL_COMMUNITY): Payer: Self-pay

## 2019-03-21 ENCOUNTER — Other Ambulatory Visit: Payer: Self-pay

## 2019-03-21 ENCOUNTER — Ambulatory Visit: Payer: Medicare Other | Admitting: Pulmonary Disease

## 2019-03-21 ENCOUNTER — Encounter: Payer: Self-pay | Admitting: Pulmonary Disease

## 2019-03-21 VITALS — BP 124/70 | HR 70 | Temp 97.7°F | Ht 64.5 in | Wt 271.6 lb

## 2019-03-21 DIAGNOSIS — J849 Interstitial pulmonary disease, unspecified: Secondary | ICD-10-CM

## 2019-03-21 DIAGNOSIS — R0600 Dyspnea, unspecified: Secondary | ICD-10-CM | POA: Diagnosis not present

## 2019-03-21 LAB — CBC WITH DIFFERENTIAL/PLATELET
Basophils Absolute: 0.1 10*3/uL (ref 0.0–0.1)
Basophils Relative: 0.7 % (ref 0.0–3.0)
Eosinophils Absolute: 0 10*3/uL (ref 0.0–0.7)
Eosinophils Relative: 0 % (ref 0.0–5.0)
HCT: 39.2 % (ref 36.0–46.0)
Hemoglobin: 12.6 g/dL (ref 12.0–15.0)
Lymphocytes Relative: 60.6 % — ABNORMAL HIGH (ref 12.0–46.0)
Lymphs Abs: 5.3 10*3/uL — ABNORMAL HIGH (ref 0.7–4.0)
MCHC: 32.2 g/dL (ref 30.0–36.0)
MCV: 99.3 fl (ref 78.0–100.0)
Monocytes Absolute: 1 10*3/uL (ref 0.1–1.0)
Monocytes Relative: 11.6 % (ref 3.0–12.0)
Neutro Abs: 2.4 10*3/uL (ref 1.4–7.7)
Neutrophils Relative %: 27.1 % — ABNORMAL LOW (ref 43.0–77.0)
Platelets: 83 10*3/uL — ABNORMAL LOW (ref 150.0–400.0)
RBC: 3.95 Mil/uL (ref 3.87–5.11)
RDW: 20.3 % — ABNORMAL HIGH (ref 11.5–15.5)
WBC: 8.7 10*3/uL (ref 4.0–10.5)

## 2019-03-21 LAB — BASIC METABOLIC PANEL
BUN: 15 mg/dL (ref 6–23)
CO2: 31 mEq/L (ref 19–32)
Calcium: 9.7 mg/dL (ref 8.4–10.5)
Chloride: 107 mEq/L (ref 96–112)
Creatinine, Ser: 0.73 mg/dL (ref 0.40–1.20)
GFR: 95.72 mL/min (ref >60.00)
Glucose, Bld: 105 mg/dL — ABNORMAL HIGH (ref 70–99)
Potassium: 4 mEq/L (ref 3.5–5.1)
Sodium: 145 mEq/L (ref 135–145)

## 2019-03-21 LAB — PROTIME-INR
INR: 1.1 ratio — ABNORMAL HIGH (ref 0.8–1.0)
Prothrombin Time: 12.8 s (ref 9.6–13.1)

## 2019-03-21 NOTE — Patient Instructions (Addendum)
We will check some labs today including metabolic panel, CBC, PT/INR We will schedule you for bronchoscopy on 12/16 Continue prednisone at 40 mg Increase Lasix to 40 mg twice daily for 1 week and then back to 40 mg once a day  Follow-up in 4 weeks.

## 2019-03-21 NOTE — Progress Notes (Signed)
Kathy Irwin    [id number]    07/20/1950  Primary Care Physician:Edwards, Milford Cage, NP  Referring Physician: Kerin Perna, NP 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  Symptoms started in 2012 when she was hospitalized for 7 days with respiratory failure.  Her primary pulmonologist is Dr. Priscille Loveless at [facility].  Work-up included normal echocardiogram, bronchoscopy with transbronchial biopsy in 2012 showing mild chronic interstitial inflammation with no granulomas.  CD4/CD8 ratio of 1.8. PFTs showing mild restriction with TLC 70%, severe diffusion abnormality at 38%.  She is also evaluated at [facility] in [place] where she was referred to cardiothoracic surgery in 2013 for VATS wedge biopsy however since symptoms responded well to prednisone this was deferred.  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  She was evaluated by rheumatology and told there is no evidence of rheumatoid arthritis.  She has been on chronic prednisone since then  History notable for hospitalization at [facility] for acute respiratory failure with pulmonary infiltrates in September 2019 when she was visiting family in [place].  Treated with short course of steroids, bronchodilators  Last seen by Dr. Priscille Loveless on June 25, 2018 for interstitial lung disease of unclear etiology.   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  Bronchoscope was recommended at that time but unable to be scheduled  She moved to [facility] in July 2020 to be closer to her children. Hospitalized for COVID-19 in September 2020.   Treated with steroids, remdesivir and Actemra x2.  She was hospitalized again in September 2020 with hypoxic respiratory failure secondary to ILD flare, acute diastolic heart failure.  Discharged on a slow prednisone taper over 6 weeks.  As she is got of the prednisone her dyspnea is increased again with increasing lower extremity edema.  Apparently she has been of Lasix for several weeks.  Pets: No pets, cats, birds, farm animals Occupation: Used to run a daycare center Exposures: May have mold in the bathroom in her prior house.  No ongoing exposures.  No hot tub, Jacuzzi.  No feather pillows or comforters Smoking history: Never smoker Travel history: Has not lived outside [place].  No significant recent travel Relevant family history: No significant family history of lung disease  Interim history: At last visit she was re started on prednisone 80 mg a day and Lasix for lower extremity edema States that the edema is better.  Dyspnea is also better but continues to have shortness of breath on exertion Denies any cough, sputum production, fevers, chills Continues on supplemental oxygen at 3 L  Outpatient Encounter Medications as of 03/21/2019  Medication Sig  . acetaminophen (TYLENOL) 500 MG tablet Take 1,000 mg by mouth 2 (two) times daily as needed (for pain).  Marland Kitchen albuterol (PROAIR HFA) 108 (90 Base) MCG/ACT inhaler Inhale 2 puffs into the lungs every 6 (six) hours as needed for wheezing or shortness of breath.  Marland Kitchen albuterol (PROVENTIL) (2.5 MG/3ML) 0.083% nebulizer solution Take 3 mLs (2.5 mg total) by nebulization every 6 (six) hours as needed for wheezing or shortness of breath.  Marland Kitchen atorvastatin (LIPITOR) 20 MG tablet Take 1 tablet (20 mg total) by mouth at bedtime.  Marland Kitchen  Azelastine-Fluticasone (DYMISTA NA) Place 1-2 sprays into both nostrils daily as needed (for allergies).   . blood glucose meter kit and supplies KIT Dispense based on patient and insurance preference. Use up to four  times daily as directed. (FOR ICD-9 250.00, 250.01). For QAC - HS accuchecks.  Marland Kitchen FLUoxetine (PROZAC) 20 MG tablet TAKE 1 TABLET BY MOUTH EVERY DAY  . furosemide (LASIX) 40 MG tablet Take 1 tablet (40 mg total) by mouth daily.  Marland Kitchen gabapentin (NEURONTIN) 300 MG capsule Take 1 capsule (300 mg total) by mouth 2 (two) times daily.  Marland Kitchen glimepiride (AMARYL) 2 MG tablet Take 1 tablet (2 mg total) by mouth daily with breakfast.  . glucose blood (FREESTYLE LITE) test strip For glucose testing every before meals at bedtime. Diagnosis E 11.65  Can substitute to any accepted brand  . guaiFENesin (MUCINEX) 600 MG 12 hr tablet Take 1 tablet (600 mg total) by mouth 2 (two) times daily.  . Insulin Lispro Prot & Lispro (HUMALOG MIX 75/25 KWIKPEN) (75-25) 100 UNIT/ML Kwikpen Inject 80 Units into the skin See admin instructions. Inject 50 units into the skin before breakfast "and a second dose at bedtime 30 units  . Melatonin 1 MG TABS TAKE 5 TABLETS (5 MG TOTAL) BY MOUTH AT BEDTIME AS NEEDED.  . metoprolol (TOPROL-XL) 200 MG 24 hr tablet Take 1 tablet (200 mg total) by mouth 2 (two) times daily.  Marland Kitchen omeprazole (PRILOSEC) 20 MG capsule Take 1 capsule (20 mg total) by mouth daily.  . pravastatin (PRAVACHOL) 10 MG tablet Take 1 tablet (10 mg total) by mouth daily.  . predniSONE (DELTASONE) 20 MG tablet Take 2 tablets (40 mg total) by mouth daily with breakfast.  . sertraline (ZOLOFT) 50 MG tablet Take 1 tablet (50 mg total) by mouth every evening.  Marland Kitchen VITAMIN D PO Take 1 tablet by mouth daily.   No facility-administered encounter medications on file as of 03/21/2019.    Physical Exam: Blood pressure 124/70, pulse 70, temperature 97.7 F (36.5 C), temperature source Temporal, height 5' 4.5" (1.638 m), weight 271 lb 9.6 oz (123.2 kg), SpO2 94 %. Gen:      No acute distress HEENT:  EOMI, sclera anicteric Neck:     No masses; no thyromegaly Lungs:    Bibasilar crackles CV:         Regular rate and rhythm; no murmurs  Abd:      + bowel sounds; soft, non-tender; no palpable masses, no distension Ext:    1-2+ edema; adequate peripheral perfusion Skin:      Warm and dry; no rash Neuro: alert and oriented x 3 Psych: normal mood and affect  Data Reviewed: Imaging: High-resolution CT 02/11/2019-patchy areas of groundglass attenuation, septal thickening bilaterally with no basal gradient.  No traction bronchiectasis or honeycombing.  Moderate air trapping.  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  Labs: Work-up at [facility] 2013-rheumatoid factor 52, hypersensitivity panel positive for Aspergillus fumigatus, aureobacterium pullulans Negative aldolase, ANA, ENA, SCL 70, Smith, CCP, RNP, SSA  Repeat CTD serologies/16/20-ANA, myositis panel, SCL 70, Ro, La, hypersensitivity panel, ANCA-negative Rheumatoid factor-25  Cardiac: Echocardiogram 12/24/2018 LVEF 60-65%, mild aortic wall stenosis.  Normal RV systolic function.  PA systolic pressure 44  Outside records from Dr. Priscille Loveless High-res CT chest 02/23/2018-hazy groundglass.  More pronounced on expiration images.  Worsened since 2014.  Findings suggest chronic interstitial pneumonitis consistent with history of hypersensitivity pneumonitis.  PFTs 01/16/2018 FVC 1.76 [60%], FEV1 1.16 (54%), F/F 66, TLC 4.52 (77%), DLCO 27.4 [21%]  Poor quality spirometry.  Patient unable to do correct DLCO hence test is uninterpretable  Echo 06/21/2017-LVEF normal, normal RV size and function.  Mild TR.  Sleep study 07/27/2017-no significant sleep apnea although very loud snoring and severe desaturation.  Desaturations responsive to 2 L oxygen.  Assessment:  Interstitial lung disease She has baseline interstitial lung disease of unclear etiology that is steroid responsive.  Differential is CTD associated NSIP fibrosis versus hypersensitivity pneumonitis.  Noted to have elevated RA but told her she does not have rheumatoid arthritis by rheumatologist.  She recently moved from [facility] in July 2020  and is living in a new house with no apparent ongoing exposures  Clinical picture is complicated by recent ZDKEU-99 pneumonia which may have exacerbated her interstitial lung disease. Continues to have significant dyspnea, O2 requirements and lower extremity edema.  BNP is normal, no evidence of pulmonary hypertension on echocardiogram.  Currently on prednisone at 40 mg.  Still has lower extremity edema.  Will increase Lasix to 40 twice daily. Recheck metabolic panel today She is not a candidate for surgical lung biopsy given obesity, severely reduced diffusion capacity Schedule for bronchoscope for BAL to rule out ongoing infection, evaluate for lymphocytosis, granulomas for HP Based on these results we will start CellCept, discuss at ILD conference  More then 1/2 the time of the 40 min visit was spent in counseling and/or coordination of care with the patient and family.  Plan/Recommendations: - Increase Lasix to 40 mg twice daily - Prednisone at 40 mg - Schedule bronchoscope with BAL, trans bronchial lung biopsies if she tolerates   Marshell Garfinkel MD [facility] 03/21/2019, 10:49 AM  CC: Kerin Perna, NP

## 2019-03-25 ENCOUNTER — Other Ambulatory Visit: Payer: Self-pay

## 2019-03-25 ENCOUNTER — Encounter: Payer: Self-pay | Admitting: Cardiovascular Disease

## 2019-03-25 ENCOUNTER — Ambulatory Visit: Payer: Medicare Other | Admitting: Cardiovascular Disease

## 2019-03-25 DIAGNOSIS — E1165 Type 2 diabetes mellitus with hyperglycemia: Secondary | ICD-10-CM

## 2019-03-25 DIAGNOSIS — I5031 Acute diastolic (congestive) heart failure: Secondary | ICD-10-CM | POA: Diagnosis not present

## 2019-03-25 DIAGNOSIS — J849 Interstitial pulmonary disease, unspecified: Secondary | ICD-10-CM | POA: Diagnosis not present

## 2019-03-25 DIAGNOSIS — R5383 Other fatigue: Secondary | ICD-10-CM

## 2019-03-25 DIAGNOSIS — E785 Hyperlipidemia, unspecified: Secondary | ICD-10-CM

## 2019-03-25 DIAGNOSIS — Z79899 Other long term (current) drug therapy: Secondary | ICD-10-CM

## 2019-03-25 DIAGNOSIS — I350 Nonrheumatic aortic (valve) stenosis: Secondary | ICD-10-CM | POA: Diagnosis not present

## 2019-03-25 DIAGNOSIS — R600 Localized edema: Secondary | ICD-10-CM

## 2019-03-25 DIAGNOSIS — Z794 Long term (current) use of insulin: Secondary | ICD-10-CM

## 2019-03-25 MED ORDER — METOLAZONE 2.5 MG PO TABS: ORAL_TABLET | ORAL | 5 refills | Status: DC

## 2019-03-25 NOTE — Patient Instructions (Signed)
Medication Instructions:  STOP pravastatin START metolazone - take 1 tablet 20 minutes before AM dose of lasix  -- take for 2 days then decrease to every other day for 1 week then decrease to 2 times per week (Mon/Fri)  *If you need a refill on your cardiac medications before your next appointment, please call your pharmacy*  Lab Work: FASTING lab work Dec 15-18 If you have labs (blood work) drawn today and your tests are completely normal, you will receive your results only by: Marland Kitchen MyChart Message (if you have MyChart) OR . A paper copy in the mail If you have any lab test that is abnormal or we need to change your treatment, we will call you to review the results.  Testing/Procedures: Echo @ [address] 3rd Floor  Follow-Up: At [facility], you and your health needs are our priority.  As part of our continuing mission to provide you with exceptional heart care, we have created designated Provider Care Teams.  These Care Teams include your primary Cardiologist (physician) and Advanced Practice Providers (APPs -  Physician Assistants and Nurse Practitioners) who all work together to provide you with the care you need, when you need it.  Your next appointment:   4-6 week(s) - after echo & labs  The format for your next appointment:   In Person  Provider:   You may see Dr. Claiborne Billings or one of the following Advanced Practice Providers on your designated Care Team:    Almyra Deforest, PA-C  Fabian Sharp, PA-C or   Roby Lofts, [place]   Other Instructions

## 2019-03-25 NOTE — Progress Notes (Addendum)
Cardiology Office Note    Date:  03/25/2019   ID:  Kathy Irwin, DOB 07/07/1950, MRN [id number]  PCP:  Kerin Perna, NP  Cardiologist:  Shelva Majestic, MD   New cardiology evaluation  History of Present Illness:  Kathy Irwin is a 68 y.o. female who has a history of interstitial lung disease.  She had recently moved from [facility] to [facility] in March 2020 to be close to children.  She was initially hospitalized July 31 through August 7 with COVID-19 action/pneumonia treated with steroids, remdesivir, and Actemra.  She was rehospitalized in September 2020 with hypoxic respiratory failure secondary to interstitial lung disease flare also was felt to have acute diastolic heart failure.  Echo Doppler study on December 24, 2018 showed EF 6065%, mild LVH, grade 1 diastolic dysfunction, mild mitral annular calcification, mild aortic stenosis with a mean gradient of 13 valve area at 1.7 cm and mild to moderate pulmonary hypertension with PA systolic pressure at 44 mm.  She was discharged on a slow prednisone taper over 6 weeks and has been followed by Dr. Kimber Relic of pulmonology.  On steroid therapy, dyspnea increased and she had progressive lower extremity edema.  She was last evaluated by Dr. Rolla Etienne on March 21, 2019.  She had recently been started on prednisone at high-dose and was on Lasix for edema.  She has been on supplemental oxygen at 3 L.  She is scheduled to undergo video bronchoscopy with Dr. Vaughan Browner on April 02, 2019.  She is referred for cardiology evaluation.  Patient admits to at least a 10-year history of hypertension as well as a history of insulin-dependent diabetes mellitus.  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  She has been on chronic prednisone therapy since.  At present she denies any chest tightness.  She admits to significant leg swelling.  She admits to exertional shortness of breath.  Over the past several weeks she admits to a 10 pound weight gain secondary to progressive lower extremity edema.  He has continued to be on 3 L of oxygen.   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     albuterol (PROAIR HFA) 108 (90 Base) MCG/ACT inhaler Inhale 2 puffs into the lungs every 6 (six) hours as needed for wheezing or shortness of breath. 6.7 g 1   albuterol (PROVENTIL) (2.5 MG/3ML) 0.083% nebulizer solution Take 3 mLs (2.5 mg total) by nebulization every 6 (six) hours as needed for wheezing or shortness of breath. 75 mL 1   atorvastatin (LIPITOR) 20 MG tablet Take 1 tablet (20 mg total) by mouth at bedtime. 30 tablet 3   Azelastine-Fluticasone (DYMISTA NA) Place 1-2 sprays into both nostrils  daily as needed (for allergies).      blood glucose meter kit and supplies KIT Dispense based on patient and insurance preference. Use up to four times daily as directed. (FOR ICD-9 250.00, 250.01). For QAC - HS accuchecks. 1 each 1   FLUoxetine (PROZAC) 20 MG tablet TAKE 1 TABLET BY MOUTH EVERY DAY 90 tablet 2   furosemide (LASIX) 40 MG tablet Take 1 tablet (40 mg total) by mouth daily. 30 tablet 3   gabapentin (NEURONTIN) 300 MG  capsule Take 1 capsule (300 mg total) by mouth 2 (two) times daily. 60 capsule 3   glimepiride (AMARYL) 2 MG tablet Take 1 tablet (2 mg total) by mouth daily with breakfast. 30 tablet 3   glucose blood (FREESTYLE LITE) test strip For glucose testing every before meals at bedtime. Diagnosis E 11.65  Can substitute to any accepted brand 100 each 0   guaiFENesin (MUCINEX) 600 MG 12 hr tablet Take 1 tablet (600 mg total) by mouth 2 (two) times daily. 60 tablet 0   Insulin Lispro Prot & Lispro (HUMALOG MIX 75/25 KWIKPEN) (75-25) 100 UNIT/ML Kwikpen Inject 80 Units into the skin See admin instructions. Inject 50 units into the skin before breakfast "and a second dose at bedtime 30 units 30 mL 3   Melatonin 1 MG TABS TAKE 5 TABLETS (5 MG TOTAL) BY MOUTH AT BEDTIME AS NEEDED. 90 tablet 1   metoprolol (TOPROL-XL) 200 MG 24 hr tablet Take 1 tablet (200 mg total) by mouth 2 (two) times daily. 60 tablet 3   omeprazole (PRILOSEC) 20 MG capsule Take 1 capsule (20 mg total) by mouth daily. 30 capsule 0   pravastatin (PRAVACHOL) 10 MG tablet Take 1 tablet (10 mg total) by mouth daily. 30 tablet 3   predniSONE (DELTASONE) 20 MG tablet Take 2 tablets (40 mg total) by mouth daily with breakfast. 60 tablet 1   sertraline (ZOLOFT) 50 MG tablet Take 1 tablet (50 mg total) by mouth every evening. 30 tablet 3   VITAMIN D PO Take 1 tablet by mouth daily.     No facility-administered medications prior to visit.      Allergies:   Other, Iodine, and Merbromin   Social History   Socioeconomic History   Marital status: Married    Spouse name: Not on file   Number of children: Not on file   Years of education: Not on file   Highest education level: Not on file  Occupational History   Not on file  Social Needs   Financial resource strain: Not on file   Food insecurity    Worry: Not on file    Inability: Not on file   Transportation needs    Medical: Not on file    Non-medical: Not on file    Tobacco Use   Smoking status: Never Smoker   Smokeless tobacco: Never Used  Substance and Sexual Activity   Alcohol use: Never    Frequency: Never   Drug use: Never   Sexual activity: Not on file  Lifestyle   Physical activity    Days per week: Not on file    Minutes per session: Not on file   Stress: Not on file  Relationships   Social connections    Talks on phone: Not on file    Gets together: Not on file    Attends religious service: Not on file    Active member of club or organization: Not on file    Attends meetings  of clubs or organizations: Not on file    Relationship status: Not on file  Other Topics Concern   Not on file  Social History Narrative   Not on file    Additional social history is notable that she was born in [place].  She had lived many years in [place].  She had moved to [place] to be close to family.  He is married and her husband is in a nursing home in [place].  She has 4 children ages 37, 41, 64, and 82.  Family History:  The patient's family history includes Diabetes in an other family member; Hypertension in an other family member.   Her father died at age 13, her mother died at age 38.  She has 4 sisters, and 2 brothers.  One brother died with AIDS.  ROS General: Negative; No fevers, chills, or night sweats;  HEENT: Negative; No changes in vision or hearing, sinus congestion, difficulty swallowing Pulmonary: Positive for interstitial lung disease; positive for hospitalization x2 for COVID-19 pneumonia and ILD exacerbation Cardiovascular: See HPI; progressive lower extremity edema GI: Negative; No nausea, vomiting, diarrhea, or abdominal pain GU: Negative; No dysuria, hematuria, or difficulty voiding Musculoskeletal: Negative; no myalgias, joint pain, or weakness Hematologic/Oncology: Negative; no easy bruising, bleeding Endocrine: Positive for insulin-dependent diabetes mellitus Neuro:  Negative; no changes in balance, headaches Skin: Negative; No rashes or skin lesions Psychiatric: Negative; No behavioral problems, depression Sleep: Positive for loud snoring, no daytime sleepiness, hypersomnolence, bruxism, restless legs, hypnogognic hallucinations, no cataplexy Other comprehensive 14 point system review is negative.   PHYSICAL EXAM:   VS:  BP 139/74    Pulse 89    Temp (!) 97 F (36.1 C)    Ht 5' 4.5" (1.638 m)    Wt 260 lb (117.9 kg)    BMI 43.94 kg/m     Repeat blood pressure by me 140/82  Wt Readings from Last 3 Encounters:  03/25/19 260 lb (117.9 kg)  03/21/19 271 lb 9.6 oz (123.2 kg)  03/03/19 271 lb (122.9 kg)    General: Alert, oriented, no distress.  Skin: normal turgor, no rashes, warm and dry HEENT: Normocephalic, atraumatic. Pupils equal round and reactive to light; sclera anicteric; extraocular muscles intact;  Nose without nasal septal hypertrophy Mouth/Parynx benign; Mallinpatti scale 3 Neck: No JVD, no carotid bruits; normal carotid upstroke Lungs: clear to ausculatation and percussion; no wheezing or rales Chest wall: without tenderness to palpitation Heart: PMI not displaced, RRR, s1 s2 normal, 1/6 systolic murmur, no diastolic murmur, no rubs, gallops, thrills, or heaves Abdomen: soft, nontender; no hepatosplenomehaly, BS+; abdominal aorta nontender and not dilated by palpation. Back: no CVA tenderness Pulses 2+ Musculoskeletal: full range of motion, normal strength, no joint deformities Extremities: no clubbing cyanosis or edema, Homan's sign negative  Neurologic: grossly nonfocal; Cranial nerves grossly wnl Psychologic: Normal mood and affect   Studies/Labs Reviewed:   EKG:  EKG is ordered today.  ECG (independently read by me): Sinus rhythm at 89 bpm, PAC, LVH by voltage.  QTc interval 450 ms  Recent Labs: BMP Latest Ref Rng & Units 03/21/2019 03/03/2019 01/21/2019  Glucose 70 - 99 mg/dL 105(H) 150(H) 184(H)  BUN 6 - 23 mg/dL _0 Creatinine 0.40 - 1.20 mg/dL 0.73 0.58 0.72  BUN/Creat Ratio 12 - 28 - - -  Sodium 135 - 145 mEq/L 145 142 142  Potassium 3.5 - 5.1 mEq/L 4.0 4.3 4.4  Chloride 96 -  112 mEq/L 107 105 107  CO2 19 - 32 mEq/L _0 Calcium 8.4 - 10.5 mg/dL 9.7 9.9 9.7     Hepatic Function Latest Ref Rng & Units 03/03/2019 01/21/2019 01/09/2019  Total Protein 6.0 - 8.3 g/dL 7.0 6.5 6.7  Albumin 3.5 - 5.2 g/dL 4.3 4.1 4.6  AST 0 - 37 U/L _1 ALT 0 - 35 U/L _2 Alk Phosphatase 39 - 117 U/L 80 53 54  Total Bilirubin 0.2 - 1.2 mg/dL 1.2 1.1 1.1  Bilirubin, Direct 0.0 - 0.2 mg/dL - - -    CBC Latest Ref Rng & Units 03/21/2019 03/03/2019 01/21/2019  WBC 4.0 - 10.5 K/uL 8.7 4.4 6.9  Hemoglobin 12.0 - 15.0 g/dL 12.6 13.3 11.8(L)  Hematocrit 36.0 - 46.0 % 39.2 41.9 36.0  Platelets 150.0 - 400.0 K/uL 83.0 Repeated and verified X2.(L) 87.0(L) 109(L)   Lab Results  Component Value Date   MCV 99.3 03/21/2019   MCV 101.1 (H) 03/03/2019   MCV 95.5 01/21/2019   Lab Results  Component Value Date   TSH 4.013 12/23/2018   Lab Results  Component Value Date   HGBA1C 8.1 (H) 12/24/2018     BNP    Component Value Date/Time   BNP 21.0 12/23/2018 1446    ProBNP    Component Value Date/Time   PROBNP 70.0 03/03/2019 1151     Lipid Panel  No results found for: CHOL, TRIG, HDL, CHOLHDL, VLDL, LDLCALC, LDLDIRECT, LABVLDL   RADIOLOGY: [place] Fna Bx Thyroid 1st Lesion Afirma  Result Date: 02/25/2019 INDICATION: 68 year old female with a history of thyroid nodules referred for biopsy EXAM: ULTRASOUND GUIDED FINE NEEDLE ASPIRATION OF INDETERMINATE THYROID NODULE COMPARISON:  01/13/2019 MEDICATIONS: None COMPLICATIONS: None TECHNIQUE: Informed written consent was obtained from the patient after a discussion of the risks, benefits and alternatives to treatment. Questions regarding the procedure were encouraged and answered. A timeout was performed prior to the initiation of the procedure.  Pre-procedural ultrasound scanning demonstrated unchanged size and appearance of the indeterminate nodule within the right mid thyroid The procedure was planned. The neck was prepped in the usual sterile fashion, and a sterile drape was applied covering the operative field. A timeout was performed prior to the initiation of the procedure. Local anesthesia was provided with 1% lidocaine. Under direct ultrasound guidance, 3 FNA biopsies were performed of the right mid thyroid nodule with a 25 gauge needle. Two additional biopsies performed for Afirma testing. Multiple ultrasound images were saved for procedural documentation purposes. The samples were prepared and submitted to pathology. Limited post procedural scanning was negative for hematoma or additional complication. Dressings were placed. The patient tolerated the above procedures procedure well without immediate postprocedural complication. FINDINGS: FINDINGS Nodule reference number based on prior diagnostic ultrasound: 1 Maximum size: 2.0 cm Location: Right  ;  Mid ACR TI-RADS total points: 7 ACR TI-RADS risk category:  TR5 Prior biopsy:  No Reason for biopsy: meets ACR TI-RADS criteria Ultrasound imaging confirms appropriate placement of the needles within the thyroid nodule. IMPRESSION: Status post ultrasound-guided biopsy of right mid thyroid nodule. Signed, Dulcy Fanny. Dellia Nims, RPVI [facility] Radiology Electronically Signed   By: Corrie Mckusick D.O.   On: 02/25/2019 16:51     Additional studies/ records that were reviewed today include:  I reviewed the lobe our pulmonary records from Dr. Vaughan Browner.  I reviewed the patient's hospitalization records with her Covid 19 pneumonia and readmission.  ECHO: 12/24/2018 IMPRESSIONS  1. The left ventricle has normal systolic function with an ejection fraction of 60-65%. The cavity size was normal. There is mildly increased left ventricular wall thickness. Left  ventricular diastolic Doppler parameters are consistent with impaired  relaxation. No evidence of left ventricular regional wall motion abnormalities.  2. There is mild mitral annular calcification present. No evidence of mitral valve stenosis. Trivial mitral regurgitation.  3. The aortic valve is tricuspid. Mild calcification of the aortic valve. Aortic valve regurgitation is trivial by color flow Doppler. Mild stenosis of the aortic valve. Mean gradient 13 mmHg, AVA 1.7 cm^2. Small mobile calcification versus Lambl's  excrescence seen on the aortic valve, less likely vegetation.  4. The right ventricle has normal systolic function. The cavity was normal. There is no increase in right ventricular wall thickness.  5. Normal IVC size. PA systolic pressure 44 mmHg.  6. The aortic root is normal in size and structure.  ASSESSMENT:    No diagnosis found.   PLAN:  [name] is a 68 year old female who has a history of interstitial lung disease followed by Dr. Vaughan Browner.  He apparently had been on chronic steroids.  Apparently her symptoms initiated in 2012 when she was hospitalized for 7 days with respiratory failure.  Work-up at that time reportedly demonstrated a normal echocardiogram in addition to bronchoscopy and transbronchial biopsy which showed mild chronic interstitial inflammation without granulomas.  During her recent readmission for acute on chronic respiratory failure with hypoxia in September she required increasing steroid therapy.  He had undergone an echo Doppler study which showed normal systolic function with EF 60 to 92%, grade 1 diastolic dysfunction, mitral annular calcification, as well as mild aortic stenosis with a mean gradient of 13 and estimated valve area of 1.7 cm.  Her blood pressure today is mildly increased and on repeat by me was 140/82.  She has been taking furosemide 40 mg twice a day, metoprolol succinate 200 mg daily.  With her tense lower extremity edema I have  recommended the addition of metolazone 2.5 mg to be taken 20 minutes prior to her morning Lasix dose for 2 days then changed to every other day with eventual once or twice per week depending upon response.  I have recommended follow-up recent laboratory with a comprehensive metabolic panel, CBC, lipid studies, TSH and hemoglobin A1c.  She continues to be on atorvastatin for hyperlipidemia although her med sheet suggested that she was on both atorvastatin and pravastatin.  Will make sure she is not taking pravastatin along with atorvastatin.  She continues to be on fluoxetine for anxiety.  She has peripheral neuropathy on gabapentin.  She is diabetic on glimepiride and insulin.  She will be going under her video bronchoscopy by Dr. Vaughan Browner on December 16.  Prior to her follow-up office visit in 4 to 6 weeks I am recommending a follow-up echo Doppler study to reassess LV function and make certain she does not have any delayed myocarditis from her Covid infections.     Medication Adjustments/Labs and Tests Ordered: Current medicines are reviewed at length with the patient today.  Concerns regarding medicines are outlined above.  Medication changes, Labs and Tests ordered today are listed in the Patient Instructions below. There are no Patient Instructions on file for this visit.   Signed, Shelva Majestic, MD  03/25/2019 2:32 PM    [facility] [address] [place], [place], [place]  [address] Phone: [phone]

## 2019-03-29 ENCOUNTER — Other Ambulatory Visit (HOSPITAL_COMMUNITY)
Admission: RE | Admit: 2019-03-29 | Discharge: 2019-03-29 | Disposition: A | Discharge status: Home / Self Care | Payer: Medicare Other | Source: Ambulatory Visit | Attending: Pulmonary Disease | Admitting: Pulmonary Disease

## 2019-03-29 DIAGNOSIS — Z20828 Contact with and (suspected) exposure to other viral communicable diseases: Secondary | ICD-10-CM | POA: Insufficient documentation

## 2019-03-29 DIAGNOSIS — Z01812 Encounter for preprocedural laboratory examination: Secondary | ICD-10-CM | POA: Diagnosis present

## 2019-03-30 LAB — NOVEL CORONAVIRUS, NAA (HOSP ORDER, SEND-OUT TO REF LAB; TAT 18-24 HRS): SARS-CoV-2, NAA: NOT DETECTED

## 2019-04-01 ENCOUNTER — Encounter: Payer: Self-pay | Admitting: Cardiovascular Disease

## 2019-04-02 ENCOUNTER — Ambulatory Visit (HOSPITAL_COMMUNITY)
Admission: RE | Admit: 2019-04-02 | Discharge: 2019-04-02 | Disposition: A | Discharge status: Home / Self Care | Payer: Medicare Other | Source: Ambulatory Visit | Attending: Pulmonary Disease | Admitting: Pulmonary Disease

## 2019-04-02 ENCOUNTER — Ambulatory Visit (HOSPITAL_COMMUNITY): Payer: Medicare Other

## 2019-04-02 ENCOUNTER — Encounter (HOSPITAL_COMMUNITY)
Admission: RE | Disposition: A | Discharge status: Home / Self Care | Payer: Self-pay | Source: Home / Self Care | Attending: Pulmonary Disease

## 2019-04-02 ENCOUNTER — Ambulatory Visit (HOSPITAL_COMMUNITY)
Admission: RE | Admit: 2019-04-02 | Discharge: 2019-04-02 | Disposition: A | Discharge status: Home / Self Care | Payer: Medicare Other | Attending: Pulmonary Disease | Admitting: Pulmonary Disease

## 2019-04-02 DIAGNOSIS — Z9889 Other specified postprocedural states: Secondary | ICD-10-CM

## 2019-04-02 DIAGNOSIS — Z794 Long term (current) use of insulin: Secondary | ICD-10-CM | POA: Diagnosis not present

## 2019-04-02 DIAGNOSIS — Z79899 Other long term (current) drug therapy: Secondary | ICD-10-CM | POA: Insufficient documentation

## 2019-04-02 DIAGNOSIS — Z8619 Personal history of other infectious and parasitic diseases: Secondary | ICD-10-CM | POA: Diagnosis not present

## 2019-04-02 DIAGNOSIS — Z20828 Contact with and (suspected) exposure to other viral communicable diseases: Secondary | ICD-10-CM | POA: Diagnosis not present

## 2019-04-02 DIAGNOSIS — J849 Interstitial pulmonary disease, unspecified: Secondary | ICD-10-CM | POA: Insufficient documentation

## 2019-04-02 DIAGNOSIS — R7611 Nonspecific reaction to tuberculin skin test without active tuberculosis: Secondary | ICD-10-CM | POA: Diagnosis not present

## 2019-04-02 DIAGNOSIS — Z7952 Long term (current) use of systemic steroids: Secondary | ICD-10-CM | POA: Diagnosis not present

## 2019-04-02 DIAGNOSIS — I5031 Acute diastolic (congestive) heart failure: Secondary | ICD-10-CM | POA: Diagnosis not present

## 2019-04-02 HISTORY — PX: VIDEO BRONCHOSCOPY: SHX5072

## 2019-04-02 LAB — BODY FLUID CELL COUNT WITH DIFFERENTIAL
Eos, Fluid: 0 %
Lymphs, Fluid: 92 %
Monocyte-Macrophage-Serous Fluid: 5 % — ABNORMAL LOW (ref 50–90)
Neutrophil Count, Fluid: 3 % (ref 0–25)
Total Nucleated Cell Count, Fluid: 450 cu mm (ref 0–1000)

## 2019-04-02 LAB — GLUCOSE, CAPILLARY: Glucose-Capillary: 96 mg/dL (ref 70–99)

## 2019-04-02 SURGERY — BRONCHOSCOPY, WITH FLUOROSCOPY
Anesthesia: Moderate Sedation | Laterality: Bilateral

## 2019-04-02 MED ORDER — SODIUM CHLORIDE 0.9 % IV SOLN: INTRAVENOUS | Status: DC

## 2019-04-02 MED ORDER — LIDOCAINE HCL URETHRAL/MUCOSAL 2 % EX GEL
CUTANEOUS | Status: DC | PRN
  Administered 2019-04-02: 1

## 2019-04-02 MED ORDER — FENTANYL CITRATE (PF) 100 MCG/2ML IJ SOLN
INTRAMUSCULAR | Status: DC | PRN
  Administered 2019-04-02 (×3): 25 ug via INTRAVENOUS

## 2019-04-02 MED ORDER — BUTAMBEN-TETRACAINE-BENZOCAINE 2-2-14 % EX AERO: 1.0000 | INHALATION_SPRAY | Freq: Once | CUTANEOUS | Status: DC

## 2019-04-02 MED ORDER — PHENYLEPHRINE HCL 0.25 % NA SOLN
NASAL | Status: DC | PRN
  Administered 2019-04-02: 2 via NASAL

## 2019-04-02 MED ORDER — MIDAZOLAM HCL (PF) 10 MG/2ML IJ SOLN
INTRAMUSCULAR | Status: DC | PRN
  Administered 2019-04-02 (×3): 1 mg via INTRAVENOUS

## 2019-04-02 MED ORDER — PHENYLEPHRINE HCL 0.25 % NA SOLN
1.0000 | Freq: Four times a day (QID) | NASAL | Status: DC | PRN
  Filled 2019-04-02: qty 15

## 2019-04-02 MED ORDER — FENTANYL CITRATE (PF) 100 MCG/2ML IJ SOLN
INTRAMUSCULAR | Status: AC
  Filled 2019-04-02: qty 4

## 2019-04-02 MED ORDER — MIDAZOLAM HCL (PF) 5 MG/ML IJ SOLN
INTRAMUSCULAR | Status: AC
  Filled 2019-04-02: qty 2

## 2019-04-02 MED ORDER — LIDOCAINE HCL URETHRAL/MUCOSAL 2 % EX GEL: 1.0000 "application " | Freq: Once | CUTANEOUS | Status: DC

## 2019-04-02 MED ORDER — LIDOCAINE HCL (PF) 1 % IJ SOLN
INTRAMUSCULAR | Status: DC | PRN
  Administered 2019-04-02: 6 mL

## 2019-04-02 NOTE — Discharge Instructions (Signed)
Flexible Bronchoscopy, Care After This sheet gives you information about how to care for yourself after your test. Your doctor may also give you more specific instructions. If you have problems or questions, contact your doctor. Follow these instructions at home: Eating and drinking  Do not eat or drink anything (not even water) for 2 hours after your test, or until your numbing medicine (local anesthetic) wears off.  When your numbness is gone and your cough and gag reflexes have come back, you may: ? Eat only soft foods. ? Slowly drink liquids.  The day after the test, go back to your normal diet. Driving  Do not drive for 24 hours if you were given a medicine to help you relax (sedative).  Do not drive or use heavy machinery while taking prescription pain medicine. General instructions   Take over-the-counter and prescription medicines only as told by your doctor.  Return to your normal activities as told. Ask what activities are safe for you.  Do not use any products that have nicotine or tobacco in them. This includes cigarettes and e-cigarettes. If you need help quitting, ask your doctor.  Keep all follow-up visits as told by your doctor. This is important. It is very important if you had a tissue sample (biopsy) taken. Get help right away if:  You have shortness of breath that gets worse.  You get light-headed.  You feel like you are going to pass out (faint).  You have chest pain.  You cough up: ? More than a little blood. ? More blood than before. Summary  Do not eat or drink anything (not even water) for 2 hours after your test, or until your numbing medicine wears off.  Do not use cigarettes. Do not use e-cigarettes.  Get help right away if you have chest pain. This information is not intended to replace advice given to you by your health care provider. Make sure you discuss any questions you have with your health care provider. Document Released: 01/29/2009  Document Revised: 03/16/2017 Document Reviewed: 04/21/2016 Elsevier Patient Education  2020 [place] not eat or drink until after  1030 today 04/02/2019

## 2019-04-02 NOTE — Progress Notes (Signed)
Video bronchoscopy performed Intervention bronchial washings Intervention bronchial biopsies Patient tolerated well  Ashley Mariner RRT

## 2019-04-02 NOTE — H&P (Addendum)
Kathy Irwin    [id number]    11-Jun-1950  Primary Care Physician:Edwards, Milford Cage, NP  Referring Physician: No referring provider defined for this encounter.  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  Symptoms started in 2012 when she was hospitalized for 7 days with respiratory failure.  Her primary pulmonologist is Dr. Priscille Loveless at [facility].  Work-up included normal echocardiogram, bronchoscopy with transbronchial biopsy in 2012 showing mild chronic interstitial inflammation with no granulomas.  CD4/CD8 ratio of 1.8. PFTs showing mild restriction with TLC 70%, severe diffusion abnormality at 38%.  She is also evaluated at [facility] in [place] where she was referred to cardiothoracic surgery in 2013 for VATS wedge biopsy however since symptoms responded well to prednisone this was deferred.  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  She was evaluated by rheumatology and told there is no evidence of rheumatoid arthritis.  She has been on chronic prednisone since then  History notable for hospitalization at [facility] for acute respiratory failure with pulmonary infiltrates in September 2019 when she was visiting family in [place].  Treated with short course of steroids, bronchodilators  Last seen by Dr. Priscille Loveless on June 25, 2018 for interstitial lung disease of unclear etiology.   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  Bronchoscope was recommended at that time but unable to be scheduled  She moved to [facility] in July 2020 to be closer to her children. Hospitalized for COVID-19 in September 2020.  Treated with  steroids, remdesivir and Actemra x2.  She was hospitalized again in September 2020 with hypoxic respiratory failure secondary to ILD flare, acute diastolic heart failure.  Discharged on a slow prednisone taper over 6 weeks.  As she is got of the prednisone her dyspnea is increased again with increasing lower extremity edema.  Apparently she has been of Lasix for several weeks.  Pets: No pets, cats, birds, farm animals Occupation: Used to run a daycare center Exposures: May have mold in the bathroom in her prior house.  No ongoing exposures.  No hot tub, Jacuzzi.  No feather pillows or comforters Smoking history: Never smoker Travel history: Has not lived outside [place].  No significant recent travel Relevant family history: No significant family history of lung disease  Interim history: Patient presents for bronchoscopy for further evaluation of interstitial lung disease and rule out opportunistic infections before initiation of CellCept.  States that her breathing is stable.  Continues on oxygen at 3 L and prednisone.  Outpatient Encounter Medications as of 03/21/2019  Medication Sig  . acetaminophen (TYLENOL) 500 MG tablet Take 1,000 mg by mouth 2 (two) times daily as needed (for pain).  Marland Kitchen albuterol (PROAIR HFA) 108 (90 Base) MCG/ACT inhaler Inhale 2 puffs into the lungs every 6 (six) hours as needed for wheezing or shortness of breath.  Marland Kitchen albuterol (PROVENTIL) (2.5 MG/3ML) 0.083% nebulizer solution Take 3 mLs (2.5 mg total) by nebulization every 6 (six) hours as needed for wheezing or shortness of breath.  Marland Kitchen atorvastatin (LIPITOR) 20 MG tablet Take 1 tablet (20 mg total) by mouth at bedtime.  . Azelastine-Fluticasone (DYMISTA NA) Place 1-2 sprays into both nostrils daily as needed (for allergies).   . blood glucose meter  kit and supplies KIT Dispense based on patient and insurance preference. Use up to four times daily as directed. (FOR ICD-9 250.00, 250.01). For QAC - HS accuchecks.    Marland Kitchen FLUoxetine (PROZAC) 20 MG tablet TAKE 1 TABLET BY MOUTH EVERY DAY  . furosemide (LASIX) 40 MG tablet Take 1 tablet (40 mg total) by mouth daily.  Marland Kitchen gabapentin (NEURONTIN) 300 MG capsule Take 1 capsule (300 mg total) by mouth 2 (two) times daily.  Marland Kitchen glimepiride (AMARYL) 2 MG tablet Take 1 tablet (2 mg total) by mouth daily with breakfast.  . glucose blood (FREESTYLE LITE) test strip For glucose testing every before meals at bedtime. Diagnosis E 11.65  Can substitute to any accepted brand  . guaiFENesin (MUCINEX) 600 MG 12 hr tablet Take 1 tablet (600 mg total) by mouth 2 (two) times daily.  . Insulin Lispro Prot & Lispro (HUMALOG MIX 75/25 KWIKPEN) (75-25) 100 UNIT/ML Kwikpen Inject 80 Units into the skin See admin instructions. Inject 50 units into the skin before breakfast "and a second dose at bedtime 30 units  . Melatonin 1 MG TABS TAKE 5 TABLETS (5 MG TOTAL) BY MOUTH AT BEDTIME AS NEEDED.  . metoprolol (TOPROL-XL) 200 MG 24 hr tablet Take 1 tablet (200 mg total) by mouth 2 (two) times daily.  Marland Kitchen omeprazole (PRILOSEC) 20 MG capsule Take 1 capsule (20 mg total) by mouth daily.  . pravastatin (PRAVACHOL) 10 MG tablet Take 1 tablet (10 mg total) by mouth daily.  . predniSONE (DELTASONE) 20 MG tablet Take 2 tablets (40 mg total) by mouth daily with breakfast.  . sertraline (ZOLOFT) 50 MG tablet Take 1 tablet (50 mg total) by mouth every evening.  Marland Kitchen VITAMIN D PO Take 1 tablet by mouth daily.   No facility-administered encounter medications on file as of 03/21/2019.    Physical Exam: Gen:      No acute distress HEENT:  EOMI, sclera anicteric Neck:     No masses; no thyromegaly Lungs:    Clear to auscultation bilaterally; normal respiratory effort CV:         Regular rate and rhythm; no murmurs Abd:      + bowel sounds; soft, non-tender; no palpable masses, no distension Ext:    No edema; adequate peripheral perfusion Skin:      Warm and dry; no rash Neuro: alert and oriented x 3 Psych:  normal mood and affect  Data Reviewed: Imaging: High-resolution CT 02/11/2019-patchy areas of groundglass attenuation, septal thickening bilaterally with no basal gradient.  No traction bronchiectasis or honeycombing.  Moderate air trapping.  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  Labs: Work-up at [facility] 2013-rheumatoid factor 52, hypersensitivity panel positive for Aspergillus fumigatus, aureobacterium pullulans Negative aldolase, ANA, ENA, SCL 70, Smith, CCP, RNP, SSA  Repeat CTD serologies/16/20-ANA, myositis panel, SCL 70, Ro, La, hypersensitivity panel, ANCA-negative Rheumatoid factor-25  Cardiac: Echocardiogram 12/24/2018 LVEF 60-65%, mild aortic wall stenosis.  Normal RV systolic function.  PA systolic pressure 44  Outside records from Dr. Priscille Loveless High-res CT chest 02/23/2018-hazy groundglass.  More pronounced on expiration images.  Worsened since 2014.  Findings suggest chronic interstitial pneumonitis consistent with history of hypersensitivity pneumonitis.  PFTs 01/16/2018 FVC 1.76 [60%], FEV1 1.16 (54%), F/F 66, TLC 4.52 (77%), DLCO 27.4 [21%] Poor quality spirometry.  Patient unable to do correct DLCO hence test is uninterpretable  Echo 06/21/2017-LVEF normal, normal RV size and function.  Mild TR.  Sleep study 07/27/2017-no significant sleep apnea although very loud snoring and severe desaturation.  Desaturations  responsive to 2 L oxygen.  Assessment:  Interstitial lung disease She has baseline interstitial lung disease of unclear etiology that is steroid responsive.  Differential is CTD associated NSIP fibrosis versus hypersensitivity pneumonitis.  Noted to have elevated RA but told her she does not have rheumatoid arthritis by rheumatologist.  She recently moved from [facility] in July 2020 and is living in a new house with no apparent ongoing exposures  Clinical picture is complicated by recent BTYOM-60 pneumonia which may have exacerbated her interstitial lung disease. Continues to have  significant dyspnea, O2 requirements and lower extremity edema.  BNP is normal, no evidence of pulmonary hypertension on echocardiogram.  Plan for bronchoscopy today to see if diagnosis of hypersensitivity pneumonitis can be confirmed by BAL lymphocytosis, transbronchial biopsy and to rule out opportunistic infection Goal is to start CellCept once bronchoscopy studies are reviewed.  Risk benefits discussed in detail with patient she has agreed to proceed  Plan/Recommendations: - Bronchoscope with BAL, trans bronchial lung biopsies if she tolerates   Marshell Garfinkel MD [facility] 04/02/2019, 8:44 AM  CC: No ref. provider found

## 2019-04-02 NOTE — Op Note (Signed)
[facility] Cardiopulmonary Patient Name: Kathy Irwin Date: 04/02/2019 MRN: [id number] Attending MD: Marshell Garfinkel , MD Date of Birth: Jan 31, 1951 CSN: Finalized Age: 68 Admit Type: Outpatient Gender: Female Procedure:             Bronchoscopy Indications:           Interstitial lung disease Providers:             Marshell Garfinkel, MD, Andre Lefort RRT,RCP, Ashley Mariner RRT,RCP Referring MD:           Medicines:             Midazolam 3 mg mg IV, Fentanyl 75 mcg IV Complications:         No immediate complications Estimated Blood Loss:  Estimated blood loss: none. Procedure:             Pre-Anesthesia Assessment:                        - A History and Physical has been performed. Patient                         meds and allergies have been reviewed. The risks and                         benefits of the procedure and the sedation options and                         risks were discussed with the patient. All questions                         were answered and informed consent was obtained.                         Patient identification and proposed procedure were                         verified prior to the procedure by the physician in                         the procedure room. Mental Status Examination: alert                         and oriented. Airway Examination: normal oropharyngeal                         airway. Respiratory Examination: clear to                         auscultation. CV Examination: normal. ASA Grade                         Assessment: III - A patient with severe systemic                         disease. After reviewing the risks and benefits, the  patient was deemed in satisfactory condition to                         undergo the procedure. The anesthesia plan was to use                         moderate sedation / analgesia (conscious sedation).                         Immediately  prior to administration of medications,                         the patient was re-assessed for adequacy to receive                         sedatives. The heart rate, respiratory rate, oxygen                         saturations, blood pressure, adequacy of pulmonary                         ventilation, and response to care were monitored                         throughout the procedure. The physical status of the                         patient was re-assessed after the procedure.                        After obtaining informed consent, the bronchoscope was                         passed under direct vision. Throughout the procedure,                         the patient's blood pressure, pulse, and oxygen                         saturations were monitored continuously. the BF-H190                         ([id number]) Olympus Diagnostic Bronchoscope was                         introduced through the right nostril and advanced to                         the tracheobronchial tree of both lungs. Scope In: 8:14:39 AM Scope Out: 8:34:43 AM Findings:      Bronchoalveolar lavage was performed in the lung and sent for cell       count, bacterial culture, viral smears & culture, and fungal & AFB       analysis and cytology. 180 mL of fluid were instilled. 90 mL were       returned. The return was cloudy. There were no mucoid plugs in the       return fluid. Multiple specimens were obtained and pooled into one  specimen, which was sent for analysis.      Transbronchial biopsies of an area of infiltration were performed in the       lateral segment of the right middle lobe, in the medial segment of the       right middle lobe, in the medial basal segment of the right lower lobe       and in the anterior basal segment of the right lower lobe using       alligator forceps and sent for histopathology examination. The procedure       was guided by fluoroscopy. Transbronchial biopsy technique was  selected       because the sampling site was not visible endoscopically. Eight biopsy       passes were performed. Six biopsy samples were obtained. Impression:            - Interstitial lung disease                        - Bronchoalveolar lavage was performed.                        - Transbronchial lung biopsies were performed. Moderate Sedation:      Moderate (conscious) sedation was administered by the endoscopy nurse       and supervised by the endoscopist. The following parameters were       monitored: oxygen saturation, heart rate, blood pressure, respiratory       rate, EKG, adequacy of pulmonary ventilation, and response to care.       Total physician intraservice time was 30 minutes. Recommendation:        - Await BAL and biopsy results. Procedure Code(s):     --- Professional ---                        [phone], Bronchoscopy, rigid or flexible, including                         fluoroscopic guidance, when performed; with                         transbronchial lung biopsy(s), single lobe                        [phone], Bronchoscopy, rigid or flexible, including                         fluoroscopic guidance, when performed; with bronchial                         alveolar lavage                        [phone], Bronchoscopy, rigid or flexible, including                         fluoroscopic guidance, when performed; with                         transbronchial lung biopsy(s), each additional lobe                         (List separately in addition to code for  primary                         procedure)                        [phone], Moderate sedation services provided by the same                         physician or other qualified health care professional                         performing the diagnostic or therapeutic service that                         the sedation supports, requiring the presence of an                         independent trained observer to assist in the                          monitoring of the patient's level of consciousness and                         physiological status; initial 15 minutes of                         intraservice time, patient age 90 years or older                        [phone], Moderate sedation; each additional 15 minutes                         intraservice time Diagnosis Code(s):     --- Professional ---                        J84.9, Interstitial pulmonary disease, unspecified CPT copyright 2019 [employer]. All rights reserved. The codes documented in this report are preliminary and upon coder review may  be revised to meet current compliance requirements. Marshell Garfinkel, MD 04/02/2019 8:54:01 AM Number of Addenda: 0

## 2019-04-03 LAB — CYTOLOGY - NON PAP

## 2019-04-03 LAB — PNEUMOCYSTIS JIROVECI SMEAR BY DFA: Pneumocystis jiroveci Ag: NEGATIVE

## 2019-04-03 LAB — NOVEL CORONAVIRUS, NAA (HOSP ORDER, SEND-OUT TO REF LAB; TAT 18-24 HRS): SARS-CoV-2, NAA: NOT DETECTED

## 2019-04-03 LAB — ACID FAST SMEAR (AFB, MYCOBACTERIA): Acid Fast Smear: NEGATIVE

## 2019-04-04 LAB — CULTURE, BAL-QUANTITATIVE W GRAM STAIN: Culture: NO GROWTH

## 2019-04-05 LAB — TSH: TSH: 2.06 u[IU]/mL (ref 0.450–4.500)

## 2019-04-05 LAB — COMPREHENSIVE METABOLIC PANEL
ALT: 14 IU/L (ref 0–32)
AST: 19 IU/L (ref 0–40)
Albumin/Globulin Ratio: 1.7 (ref 1.2–2.2)
Albumin: 4.2 g/dL (ref 3.8–4.8)
Alkaline Phosphatase: 69 IU/L (ref 39–117)
BUN/Creatinine Ratio: 20 (ref 12–28)
BUN: 15 mg/dL (ref 8–27)
Bilirubin Total: 0.8 mg/dL (ref 0.0–1.2)
CO2: 28 mmol/L (ref 20–29)
Calcium: 9.9 mg/dL (ref 8.7–10.3)
Chloride: 99 mmol/L (ref 96–106)
Creatinine, Ser: 0.74 mg/dL (ref 0.57–1.00)
GFR calc Af Amer: 96 mL/min/{1.73_m2} (ref >59)
GFR calc non Af Amer: 84 mL/min/{1.73_m2} (ref >59)
Globulin, Total: 2.5 g/dL (ref 1.5–4.5)
Glucose: 121 mg/dL — ABNORMAL HIGH (ref 65–99)
Potassium: 3.6 mmol/L (ref 3.5–5.2)
Sodium: 140 mmol/L (ref 134–144)
Total Protein: 6.7 g/dL (ref 6.0–8.5)

## 2019-04-05 LAB — LIPID PANEL
Chol/HDL Ratio: 3.5 ratio (ref 0.0–4.4)
Cholesterol, Total: 166 mg/dL (ref 100–199)
HDL: 48 mg/dL (ref >39)
LDL Chol Calc (NIH): 103 mg/dL — ABNORMAL HIGH (ref 0–99)
Triglycerides: 80 mg/dL (ref 0–149)
VLDL Cholesterol Cal: 15 mg/dL (ref 5–40)

## 2019-04-05 LAB — CBC
Hematocrit: 38.2 % (ref 34.0–46.6)
Hemoglobin: 12.5 g/dL (ref 11.1–15.9)
MCH: 31.8 pg (ref 26.6–33.0)
MCHC: 32.7 g/dL (ref 31.5–35.7)
MCV: 97 fL (ref 79–97)
NRBC: 1 % — ABNORMAL HIGH (ref 0–0)
Platelets: 78 10*3/uL — CL (ref 150–450)
RBC: 3.93 x10E6/uL (ref 3.77–5.28)
RDW: 17.6 % — ABNORMAL HIGH (ref 11.7–15.4)
WBC: 6.4 10*3/uL (ref 3.4–10.8)

## 2019-04-05 LAB — HEMOGLOBIN A1C
Est. average glucose Bld gHb Est-mCnc: 194 mg/dL
Hgb A1c MFr Bld: 8.4 % — ABNORMAL HIGH (ref 4.8–5.6)

## 2019-04-08 ENCOUNTER — Other Ambulatory Visit (HOSPITAL_COMMUNITY): Payer: Medicare Other

## 2019-04-08 LAB — SURGICAL PATHOLOGY

## 2019-04-16 ENCOUNTER — Telehealth: Payer: Self-pay | Admitting: Cardiovascular Disease

## 2019-04-16 MED ORDER — PRAVASTATIN SODIUM 40 MG PO TABS: 40.0000 mg | ORAL_TABLET | Freq: Every evening | ORAL | 3 refills | Status: DC

## 2019-04-16 NOTE — Telephone Encounter (Signed)
-----  Message from Troy Sine, MD sent at 04/12/2019  2:50 PM EST ----- Platelets are slightly lower; followed by Dr. Irene Limbo of oncology; glucose mildly increased; hemoglobin and A1c increased, needs improved diabetic management, referred back to primary; TSH normal; if patient is on pravastatin 10 mg, consider increasing to 40 mg will change to atorvastatin 20 mg

## 2019-04-16 NOTE — Telephone Encounter (Signed)
Patient called with results. Routed to PCP. Agrees w/plan for statin dose change. She was taking pravastatin 12m - increased to 421mper MD. Rx(s) sent to pharmacy electronically.

## 2019-04-22 ENCOUNTER — Ambulatory Visit (HOSPITAL_COMMUNITY): Payer: Medicare Other | Attending: Cardiology

## 2019-04-22 ENCOUNTER — Other Ambulatory Visit: Payer: Self-pay | Admitting: *Deleted

## 2019-04-22 ENCOUNTER — Other Ambulatory Visit: Payer: Self-pay

## 2019-04-22 DIAGNOSIS — D696 Thrombocytopenia, unspecified: Secondary | ICD-10-CM

## 2019-04-22 DIAGNOSIS — Z79899 Other long term (current) drug therapy: Secondary | ICD-10-CM | POA: Diagnosis not present

## 2019-04-22 DIAGNOSIS — I5031 Acute diastolic (congestive) heart failure: Secondary | ICD-10-CM | POA: Insufficient documentation

## 2019-04-23 ENCOUNTER — Other Ambulatory Visit: Payer: Self-pay

## 2019-04-23 ENCOUNTER — Inpatient Hospital Stay (HOSPITAL_BASED_OUTPATIENT_CLINIC_OR_DEPARTMENT_OTHER): Payer: Medicare Other | Admitting: Hematology

## 2019-04-23 ENCOUNTER — Other Ambulatory Visit: Payer: Self-pay | Admitting: *Deleted

## 2019-04-23 ENCOUNTER — Inpatient Hospital Stay: Payer: Medicare Other | Attending: Hematology

## 2019-04-23 VITALS — BP 146/69 | HR 78 | Temp 98.5°F | Resp 17 | Ht 64.5 in | Wt 271.3 lb

## 2019-04-23 DIAGNOSIS — I082 Rheumatic disorders of both aortic and tricuspid valves: Secondary | ICD-10-CM | POA: Insufficient documentation

## 2019-04-23 DIAGNOSIS — U071 COVID-19: Secondary | ICD-10-CM | POA: Diagnosis not present

## 2019-04-23 DIAGNOSIS — D696 Thrombocytopenia, unspecified: Secondary | ICD-10-CM | POA: Insufficient documentation

## 2019-04-23 DIAGNOSIS — I352 Nonrheumatic aortic (valve) stenosis with insufficiency: Secondary | ICD-10-CM | POA: Diagnosis not present

## 2019-04-23 DIAGNOSIS — J849 Interstitial pulmonary disease, unspecified: Secondary | ICD-10-CM | POA: Insufficient documentation

## 2019-04-23 DIAGNOSIS — I110 Hypertensive heart disease with heart failure: Secondary | ICD-10-CM | POA: Diagnosis not present

## 2019-04-23 DIAGNOSIS — Z79899 Other long term (current) drug therapy: Secondary | ICD-10-CM | POA: Diagnosis not present

## 2019-04-23 DIAGNOSIS — E119 Type 2 diabetes mellitus without complications: Secondary | ICD-10-CM | POA: Diagnosis not present

## 2019-04-23 DIAGNOSIS — Z794 Long term (current) use of insulin: Secondary | ICD-10-CM | POA: Insufficient documentation

## 2019-04-23 LAB — CBC WITH DIFFERENTIAL (CANCER CENTER ONLY)
Abs Immature Granulocytes: 0.01 10*3/uL (ref 0.00–0.07)
Basophils Absolute: 0 10*3/uL (ref 0.0–0.1)
Basophils Relative: 0 %
Eosinophils Absolute: 0 10*3/uL (ref 0.0–0.5)
Eosinophils Relative: 0 %
HCT: 38.6 % (ref 36.0–46.0)
Hemoglobin: 12.4 g/dL (ref 12.0–15.0)
Immature Granulocytes: 0 %
Lymphocytes Relative: 64 %
Lymphs Abs: 2.8 10*3/uL (ref 0.7–4.0)
MCH: 31.3 pg (ref 26.0–34.0)
MCHC: 32.1 g/dL (ref 30.0–36.0)
MCV: 97.5 fL (ref 80.0–100.0)
Monocytes Absolute: 0.5 10*3/uL (ref 0.1–1.0)
Monocytes Relative: 11 %
Neutro Abs: 1.1 10*3/uL — ABNORMAL LOW (ref 1.7–7.7)
Neutrophils Relative %: 25 %
Platelet Count: 57 10*3/uL — ABNORMAL LOW (ref 150–400)
RBC: 3.96 MIL/uL (ref 3.87–5.11)
RDW: 19.9 % — ABNORMAL HIGH (ref 11.5–15.5)
WBC Count: 4.5 10*3/uL (ref 4.0–10.5)
nRBC: 0.9 % — ABNORMAL HIGH (ref 0.0–0.2)

## 2019-04-23 LAB — CMP (CANCER CENTER ONLY)
ALT: 14 U/L (ref 0–44)
AST: 16 U/L (ref 15–41)
Albumin: 3.9 g/dL (ref 3.5–5.0)
Alkaline Phosphatase: 56 U/L (ref 38–126)
Anion gap: 8 (ref 5–15)
BUN: 11 mg/dL (ref 8–23)
CO2: 28 mmol/L (ref 22–32)
Calcium: 9.2 mg/dL (ref 8.9–10.3)
Chloride: 107 mmol/L (ref 98–111)
Creatinine: 0.78 mg/dL (ref 0.44–1.00)
GFR, Est AFR Am: >60 mL/min (ref >60)
GFR, Estimated: >60 mL/min (ref >60)
Glucose, Bld: 109 mg/dL — ABNORMAL HIGH (ref 70–99)
Potassium: 4.8 mmol/L (ref 3.5–5.1)
Sodium: 143 mmol/L (ref 135–145)
Total Bilirubin: 1.3 mg/dL — ABNORMAL HIGH (ref 0.3–1.2)
Total Protein: 6.5 g/dL (ref 6.5–8.1)

## 2019-04-23 LAB — IMMATURE PLATELET FRACTION: Immature Platelet Fraction: 39.3 % — ABNORMAL HIGH (ref 1.2–8.6)

## 2019-04-23 NOTE — Progress Notes (Signed)
HEMATOLOGY/ONCOLOGY CONSULTATION NOTE  Date of Service: 04/23/2019  Patient Care Team: Kerin Perna, NP as PCP - General (Internal Medicine)  CHIEF COMPLAINTS/PURPOSE OF CONSULTATION:  CML??  HISTORY OF PRESENTING ILLNESS:   Kathy Irwin is a wonderful 69 y.o. 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 The pt reports that she is doing well overall.  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 They also did a BM Bx which showed that she had chronic leukemia but she is unsure what kind. She has not needed any treatments in 6 years and was only monitored with labs. Her RBC and WBC have remained within the normal range since her diagnosis. Previously her PLTs have gotten as low as 30K but have come back up on their own. She denies any pain or issues related to her leukemia within the last 6 years.   Pt had a recent [place] infection in August. She went back to the hospital on 09/07 with bacterial Pneumonia and was discharged on 09/10. Pt notes that her breathing has improved since that hospitalization but is not back to baseline. She was sent home with oxygen and has been using it. Pt has been using oxygen for about 8 years and she is not sure why it was given to her. Pt was previously diagnosed with Interstitial Lung Disease. She says that when her symptoms worsen she is given steroids for 6 weeks. She is currently on a course of Prednisone. Pt has had sleep apnea testing which revealed that she does not have sleep apnea. Her HTN and Diabetes have been well controlled and she has been taking her medications as prescribed. She also has home care who has been helping her walk and get some exercise. She will be going to see Dr. Ina Homes, a Pulmonologist, on 10/08.   Most recent lab results (01/09/2019) of CBC w/diff & CMP is as  follows: all values are WNL except for RDW at 18.0, PLTs at 117K, nRBC Rel at 1, Glucose at 118.   On review of systems, pt denies back pain, abdominal pain and any other symptoms.   On PMHx the pt reports ILD, HTN, Diabetes, COVID19 infection, bacterial Pneumonia. On Social Hx the pt reports she is a non-smoker and does not drink alcohol  INTERVAL HISTORY:  Kathy Irwin is a wonderful 69 y.o. 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 The patient's last visit with [place] was on 01/21/2019. The pt reports that she is doing well overall.  The pt reports that she was recently hospitalized for a Bronchoscopy. She feels that her breathing is about the same now as it was before. She is currently on 3 liters of oxygen per day and usually uses it at night or before she goes out. Pt has been on 40 mg of Prednisone since September for her ILD. She has not seen her Pulmonologist, Dr. Priscille Loveless since her Bronchoscopy.   The last time the pt was seen at [facility] [place] was in March of 2020. She believes that her BM Bx was performed 6 years ago, at the time of diagnosis.   Of note since the patient's last visit, pt has had FISH completed on 01/21/2019 with results revealing "No evidence of BCR/ABL rearrangement".  Lab results today (04/23/19) of CBC w/diff  and CMP is as follows: all values are WNL except for RDW at 19.9, PLT at 57K, nRBC Rel at 0.9, Neutro Abs at 1.1K, Smear Review shows "Large and Giant Platelets", Glucose at 109, Total Bilirubin at 1.3.  On review of systems, pt reports SOB and denies fevers, chills, night sweats, unexpected weight loss, abdominal pain, bone pain, bowel movement issues and any other symptoms.    MEDICAL HISTORY:  Past Medical History:  Diagnosis Date  . Acute respiratory failure with hypoxia ([place]) 12/2018  . Diabetes mellitus without complication ([place])   . 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 Hypertension   . ILD  (interstitial lung disease) ([place]) 12/24/2018    SURGICAL HISTORY: Past Surgical History:  Procedure Laterality Date  . ABDOMINAL HYSTERECTOMY    . VIDEO BRONCHOSCOPY Bilateral 04/02/2019   Procedure: VIDEO BRONCHOSCOPY WITH FLUORO;  Surgeon: Marshell Garfinkel, MD;  Location: [facility];  Service: Cardiopulmonary;  Laterality: Bilateral;    SOCIAL HISTORY: Social History   Socioeconomic History  . Marital status: Married    Spouse name: Not on file  . Number of children: Not on file  . Years of education: Not on file  . Highest education level: Not on file  Occupational History  . Not on file  Tobacco Use  . Smoking status: Never Smoker  . Smokeless tobacco: Never Used  Substance and Sexual Activity  . Alcohol use: Never  . Drug use: Never  . Sexual activity: Not on file  Other Topics Concern  . Not on file  Social History Narrative  . Not on file   Social Determinants of Health   Financial Resource Strain:   . Difficulty of Paying Living Expenses: Not on file  Food Insecurity:   . Worried About Charity fundraiser in the Last Year: Not on file  . Ran Out of Food in the Last Year: Not on file  Transportation Needs:   . Lack of Transportation (Medical): Not on file  . Lack of Transportation (Non-Medical): Not on file  Physical Activity:   . Days of Exercise per Week: Not on file  . Minutes of Exercise per Session: Not on file  Stress:   . Feeling of Stress : Not on file  Social Connections:   . Frequency of Communication with Friends and Family: Not on file  . Frequency of Social Gatherings with Friends and Family: Not on file  . Attends Religious Services: Not on file  . Active Member of Clubs or Organizations: Not on file  . Attends Archivist Meetings: Not on file  . Marital Status: Not on file  Intimate Partner Violence:   . Fear of Current or Ex-Partner: Not on file  . Emotionally Abused: Not on file  . Physically Abused: Not on file  . Sexually  Abused: Not on file    FAMILY HISTORY: Family History  Problem Relation Age of Onset  . Diabetes Other   . Hypertension Other     ALLERGIES:  is allergic to other; iodine; and merbromin.  MEDICATIONS:  Current Outpatient Medications  Medication Sig Dispense Refill  . acetaminophen (TYLENOL) 500 MG tablet Take 1,000 mg by mouth 2 (two) times daily as needed (for pain).    Marland Kitchen albuterol (PROAIR HFA) 108 (90 Base) MCG/ACT inhaler Inhale 2 puffs into the lungs every 6 (six) hours as needed for wheezing or shortness of breath. 6.7 g 1  . albuterol (PROVENTIL) (2.5 MG/3ML) 0.083% nebulizer solution Take 3 mLs (2.5 mg total)  by nebulization every 6 (six) hours as needed for wheezing or shortness of breath. 75 mL 1  . Azelastine-Fluticasone (DYMISTA) 137-50 MCG/ACT SUSP Place 1-2 sprays into both nostrils daily as needed (for allergies).     . blood glucose meter kit and supplies KIT Dispense based on patient and insurance preference. Use up to four times daily as directed. (FOR ICD-9 250.00, 250.01). For QAC - HS accuchecks. 1 each 1  . FLUoxetine (PROZAC) 20 MG tablet TAKE 1 TABLET BY MOUTH EVERY DAY 90 tablet 2  . furosemide (LASIX) 40 MG tablet Take 40 mg by mouth 2 (two) times daily.    Marland Kitchen gabapentin (NEURONTIN) 300 MG capsule Take 1 capsule (300 mg total) by mouth 2 (two) times daily. 60 capsule 3  . glimepiride (AMARYL) 2 MG tablet Take 1 tablet (2 mg total) by mouth daily with breakfast. 30 tablet 3  . glucose blood (FREESTYLE LITE) test strip For glucose testing every before meals at bedtime. Diagnosis E 11.65  Can substitute to any accepted brand 100 each 0  . guaiFENesin (MUCINEX) 600 MG 12 hr tablet Take 1 tablet (600 mg total) by mouth 2 (two) times daily. 60 tablet 0  . Insulin Lispro Prot & Lispro (HUMALOG MIX 75/25 KWIKPEN) (75-25) 100 UNIT/ML Kwikpen Inject 80 Units into the skin See admin instructions. Inject 50 units into the skin before breakfast "and a second dose at bedtime 30  units 30 mL 3  . Melatonin 1 MG TABS TAKE 5 TABLETS (5 MG TOTAL) BY MOUTH AT BEDTIME AS NEEDED. 90 tablet 1  . metolazone (ZAROXOLYN) 2.5 MG tablet Take 1 tablet by mouth daily for 2 days then every other day for 1 week then two times weekly (Mon/Fri). Take 20 minutes before AM lasix 30 tablet 5  . metoprolol (TOPROL-XL) 200 MG 24 hr tablet Take 200 mg by mouth daily.    Marland Kitchen omeprazole (PRILOSEC) 20 MG capsule Take 1 capsule (20 mg total) by mouth daily. 30 capsule 0  . pravastatin (PRAVACHOL) 40 MG tablet Take 1 tablet (40 mg total) by mouth every evening. 90 tablet 3  . predniSONE (DELTASONE) 20 MG tablet Take 2 tablets (40 mg total) by mouth daily with breakfast. 60 tablet 1  . sertraline (ZOLOFT) 50 MG tablet Take 1 tablet (50 mg total) by mouth every evening. 30 tablet 3  . VITAMIN D PO Take 1 tablet by mouth daily.     No current facility-administered medications for this visit.    REVIEW OF SYSTEMS:   A 10+ POINT REVIEW OF SYSTEMS WAS OBTAINED including neurology, dermatology, psychiatry, cardiac, respiratory, lymph, extremities, GI, GU, Musculoskeletal, constitutional, breasts, reproductive, HEENT.  All pertinent positives are noted in the HPI.  All others are negative.   PHYSICAL EXAMINATION: ECOG PERFORMANCE STATUS: 2 - Symptomatic, <50% confined to bed  . Vitals:   04/23/19 1225  BP: (!) 146/69  Pulse: 78  Resp: 17  Temp: 98.5 F (36.9 C)  SpO2: 92%   Filed Weights   04/23/19 1225  Weight: 271 lb 4.8 oz (123.1 kg)   .Body mass index is 45.85 kg/m.   Exam was given in a chair   GENERAL:alert, in no acute distress and comfortable SKIN: no acute rashes, no significant lesions EYES: conjunctiva are pink and non-injected, sclera anicteric OROPHARYNX: MMM, no exudates, no oropharyngeal erythema or ulceration NECK: supple, no JVD LYMPH:  no palpable lymphadenopathy in the cervical, axillary or inguinal regions LUNGS: clear to auscultation b/l with normal respiratory  effort HEART: regular rate & rhythm ABDOMEN:  normoactive bowel sounds , non tender, not distended. No palpable hepatosplenomegaly.  Extremity: 1+ pedal edema PSYCH: alert & oriented x 3 with fluent speech NEURO: no focal motor/sensory deficits   LABORATORY DATA:  I have reviewed the data as listed  . CBC Latest Ref Rng & Units 04/23/2019 04/04/2019 03/21/2019  WBC 4.0 - 10.5 K/uL 4.5 6.4 8.7  Hemoglobin 12.0 - 15.0 g/dL 12.4 12.5 12.6  Hematocrit 36.0 - 46.0 % 38.6 38.2 39.2  Platelets 150 - 400 K/uL 57(L) 78(LL) 83.0 Repeated and verified X2.(L)    . CMP Latest Ref Rng & Units 04/23/2019 04/04/2019 03/21/2019  Glucose 70 - 99 mg/dL 109(H) 121(H) 105(H)  BUN 8 - 23 mg/dL _0 Creatinine 0.44 - 1.00 mg/dL 0.78 0.74 0.73  Sodium 135 - 145 mmol/L 143 140 145  Potassium 3.5 - 5.1 mmol/L 4.8 3.6 4.0  Chloride 98 - 111 mmol/L 107 99 107  CO2 22 - 32 mmol/L _1 Calcium 8.9 - 10.3 mg/dL 9.2 9.9 9.7  Total Protein 6.5 - 8.1 g/dL 6.5 6.7 -  Total Bilirubin 0.3 - 1.2 mg/dL 1.3(H) 0.8 -  Alkaline Phos 38 - 126 U/L 56 69 -  AST 15 - 41 U/L 16 19 -  ALT 0 - 44 U/L 14 14 -   01/21/2019 FISH:    RADIOGRAPHIC STUDIES: I have personally reviewed the radiological images as listed and agreed with the findings in the report. DG CHEST PORT 1 VIEW  Result Date: 04/02/2019 CLINICAL DATA:  S/p bronchoscopy with multiple right lung biopsies EXAM: PORTABLE CHEST 1 VIEW COMPARISON:  12/23/2018 chest radiograph. FINDINGS: Low lung volumes. Stable cardiomediastinal silhouette with top-normal heart size. No pneumothorax. No pleural effusion. Streaky bilateral parahilar and bibasilar lung opacities, similar. IMPRESSION: Low lung volumes. No pneumothorax. Similar streaky parahilar and bibasilar lung opacities. Electronically Signed   By: Ilona Sorrel M.D.   On: 04/02/2019 09:14   ECHOCARDIOGRAM COMPLETE  Result Date: 04/22/2019   ECHOCARDIOGRAM REPORT   Patient Name:   Kathy Irwin Date of Exam:  04/22/2019 Medical Rec #:  [id number]     Height:       64.5 in Accession #:    [id number]    Weight:       260.0 lb Date of Birth:  1951/04/14      BSA:          2.20 m Patient Age:    28 years      BP:           139/74 mmHg Patient Gender: F             HR:           67 bpm. Exam Location:  [facility] Procedure: 2D Echo, Cardiac Doppler and Color Doppler Indications:    I50.31  History:        Patient has prior history of Echocardiogram examinations, most                 recent 12/24/2018. CHF, Mild AS; Risk Factors:Morbid obesity,                 Hypertension, Diabetes and Dyslipidemia.  Sonographer:    Coralyn Helling RDCS Referring Phys: [facility] Comments: Technically difficult study due to poor echo windows, suboptimal parasternal window, no subcostal window and patient is morbidly obese. Image acquisition challenging due to patient body habitus. IMPRESSIONS  1. Left ventricular ejection fraction, by visual estimation, is 55 to 60%. The left ventricle has normal function. There is mildly increased left ventricular hypertrophy.  2. The left ventricle has no regional wall motion abnormalities.  3. Global right ventricle has normal systolic function.The right ventricular size is normal. No increase in right ventricular wall thickness.  4. Left atrial size was normal.  5. Right atrial size was normal.  6. The mitral valve is normal in structure. No evidence of mitral valve regurgitation. No evidence of mitral stenosis.  7. The tricuspid valve is normal in structure.  8. The aortic valve is normal in structure. Aortic valve regurgitation is mild. Mild aortic valve stenosis.  9. The pulmonic valve was normal in structure. Pulmonic valve regurgitation is not visualized. 10. Moderately elevated pulmonary artery systolic pressure. 11. The inferior vena cava is normal in size with greater than 50% respiratory variability, suggesting right atrial pressure of 3 mmHg. FINDINGS  Left Ventricle: Left  ventricular ejection fraction, by visual estimation, is 55 to 60%. The left ventricle has normal function. The left ventricle has no regional wall motion abnormalities. There is mildly increased left ventricular hypertrophy. Normal left atrial pressure. Right Ventricle: The right ventricular size is normal. No increase in right ventricular wall thickness. Global RV systolic function is has normal systolic function. The tricuspid regurgitant velocity is 2.97 m/s, and with an assumed right atrial pressure  of 10 mmHg, the estimated right ventricular systolic pressure is moderately elevated at 45.3 mmHg. Left Atrium: Left atrial size was normal in size. Right Atrium: Right atrial size was normal in size Pericardium: There is no evidence of pericardial effusion. Mitral Valve: The mitral valve is normal in structure. No evidence of mitral valve regurgitation. No evidence of mitral valve stenosis by observation. Tricuspid Valve: The tricuspid valve is normal in structure. Tricuspid valve regurgitation is trivial. Aortic Valve: The aortic valve is normal in structure.. There is mild thickening and moderate calcification of the aortic valve. Aortic valve regurgitation is mild. Aortic regurgitation PHT measures 544 msec. Mild aortic stenosis is present. There is mild thickening of the aortic valve. There is moderate calcification of the aortic valve. Aortic valve mean gradient measures 11.0 mmHg. Aortic valve peak gradient measures 21.3 mmHg. Aortic valve area, by VTI measures 1.66 cm. Pulmonic Valve: The pulmonic valve was normal in structure. Pulmonic valve regurgitation is not visualized. Pulmonic regurgitation is not visualized. Aorta: The aortic root, ascending aorta and aortic arch are all structurally normal, with no evidence of dilitation or obstruction. Venous: The inferior vena cava is normal in size with greater than 50% respiratory variability, suggesting right atrial pressure of 3 mmHg. IAS/Shunts: No atrial  level shunt detected by color flow Doppler. There is no evidence of a patent foramen ovale. No ventricular septal defect is seen or detected. There is no evidence of an atrial septal defect.  LEFT VENTRICLE PLAX 2D LVIDd:         4.60 cm  Diastology LVIDs:         3.10 cm  LV e' lateral:   8.16 cm/s LV PW:         1.30 cm  LV E/e' lateral: 11.2 LV IVS:        1.30 cm  LV e' medial:    6.53 cm/s LVOT diam:     2.10 cm  LV E/e' medial:  14.0 LV SV:         59 ml LV SV Index:   24.95  LVOT Area:     3.46 cm  RIGHT VENTRICLE RV S prime:     10.90 cm/s TAPSE (M-mode): 2.2 cm RVSP:           38.3 mmHg LEFT ATRIUM             Index       RIGHT ATRIUM           Index LA diam:        3.60 cm 1.64 cm/m  RA Pressure: 3.00 mmHg LA Vol (A2C):   52.1 ml 23.69 ml/m RA Area:     17.10 cm LA Vol (A4C):   54.4 ml 24.74 ml/m RA Volume:   45.50 ml  20.69 ml/m LA Biplane Vol: 55.2 ml 25.10 ml/m  AORTIC VALVE AV Area (Vmax):    1.59 cm AV Area (Vmean):   1.59 cm AV Area (VTI):     1.66 cm AV Vmax:           230.50 cm/s AV Vmean:          157.000 cm/s AV VTI:            0.486 m AV Peak Grad:      21.3 mmHg AV Mean Grad:      11.0 mmHg LVOT Vmax:         106.00 cm/s LVOT Vmean:        72.100 cm/s LVOT VTI:          0.233 m LVOT/AV VTI ratio: 0.48 AI PHT:            544 msec  AORTA Ao Root diam: 3.00 cm Ao Asc diam:  3.70 cm MITRAL VALVE                        TRICUSPID VALVE MV Area (PHT):                      TR Peak grad:   35.3 mmHg                                     TR Vmax:        308.00 cm/s MV Decel Time: 256 msec             Estimated RAP:  3.00 mmHg MV E velocity: 91.30 cm/s 103 cm/s  RVSP:           38.3 mmHg MV A velocity: 84.80 cm/s 70.3 cm/s MV E/A ratio:  1.08       1.5       SHUNTS                                     Systemic VTI:  0.23 m                                     Systemic Diam: 2.10 cm  Candee Furbish MD Electronically signed by Candee Furbish MD Signature Date/Time: 04/22/2019/4:13:06 PM    Final    DG C-ARM  BRONCHOSCOPY  Result Date: 04/02/2019 C-ARM BRONCHOSCOPY: Fluoroscopy was utilized by the requesting physician.  No radiographic interpretation.    ASSESSMENT & PLAN:   1) H/O CML ?  2)Thrombocytopenia PLAN:  -  Discussed pt labwork today, 04/23/19; neutropenia, thrombocytopenia, elevated nRBC, blood chemistries are steady -Discussed 01/21/2019 FISH which revealed "No evidence of BCR/ABL rearrangement". -Pt does not have CML based on labs -Will need access to pt's previous medical records, especially BM Bx, for a better understanding of previous Dx. Her previous oncology practice has shut down and merged with a different health system and we are having difficulties getting her previous records. She has been calling them as well. -Advised pt that her ILD should be her medical priority at this time   FOLLOW UP: Awaiting outside records to determine need for additional workup vs monitoring of thrombocytopenia  The total time spent in the appt was 15 minutes and more than 50% was on counseling and direct patient cares.  All of the patient's questions were answered with apparent satisfaction. The patient knows to call the clinic with any problems, questions or concerns.    Sullivan Lone MD [place] AAHIVMS [facility] Hematology/Oncology Physician [facility]  (Office):       [phone] (Work cell):  [phone] (Fax):           [phone]  04/23/2019 4:37 PM  I, Yevette Edwards, am acting as a scribe for Dr. Sullivan Lone.   .I have reviewed the above documentation for accuracy and completeness, and I agree with the above. Brunetta Genera MD

## 2019-04-26 ENCOUNTER — Other Ambulatory Visit: Payer: Self-pay | Admitting: Pulmonary Disease

## 2019-04-26 DIAGNOSIS — J849 Interstitial pulmonary disease, unspecified: Secondary | ICD-10-CM

## 2019-04-29 ENCOUNTER — Ambulatory Visit: Payer: Self-pay | Admitting: Pulmonary Disease

## 2019-04-29 LAB — FUNGAL ORGANISM REFLEX

## 2019-04-29 LAB — FUNGUS CULTURE WITH STAIN

## 2019-04-29 LAB — FUNGUS CULTURE RESULT

## 2019-05-01 ENCOUNTER — Telehealth: Payer: Self-pay

## 2019-05-01 NOTE — Telephone Encounter (Signed)
   Primary Cardiologist:Thomas Claiborne Billings, MD  Chart reviewed as part of pre-operative protocol coverage. Patient was last seen by Dr. Claiborne Billings for acute diastolic and reported significant lower extremity edema, exertional shortness of breath, and weight gain. She was started on Metolazone and Echo was ordered. Echo on 04/22/2019 showed normal LV function with mild LVH, mild AS/AI, and moderately elevated PASP. She has a follow-up appointment with Dr. Claiborne Billings on 05/06/2019. Given recent CHF exacerbation, she will need to be seen at this visit before we can address preoperative risk.   Pre-op covering staff: - Please contact requesting surgeon's office via preferred method (i.e, phone, fax) to inform them of appointment on 05/06/2019 which is needed before surgery.   Darreld Mclean, PA-C  05/01/2019, 11:49 AM

## 2019-05-01 NOTE — Telephone Encounter (Signed)
[facility] Pre-operative Risk Assessment    Request for surgical clearance:  1. What type of surgery is being performed? Total Thyroidectomy   2. When is this surgery scheduled? TBD   3. What type of clearance is required (medical clearance vs. Pharmacy clearance to hold med vs. Both)? Medical   4. Are there any medications that need to be held prior to surgery and how long?    5. Practice name and name of physician performing surgery? Dr. [facility]    6. What is your office phone number [phone]    7.   What is your office fax number [phone] ATTN: Mammie Lorenzo LPN  8.   Anesthesia type (None, local, MAC, general) ? General

## 2019-05-01 NOTE — Telephone Encounter (Signed)
I called and s/w Sherri at [facility] to please let Kathy Lorenzo, LPN know that pt has appt 05/06/19 with Dr. Claiborne Billings which at that time will assess for cardiac clearance. If any questions please call [phone]. I will forward clearance notes to Dr. Claiborne Billings for upcoming appt 05/06/19. I will remove from the pre op call back pool.

## 2019-05-02 ENCOUNTER — Ambulatory Visit: Payer: Medicare Other | Admitting: Pulmonary Disease

## 2019-05-02 ENCOUNTER — Encounter: Payer: Self-pay | Admitting: Pulmonary Disease

## 2019-05-02 ENCOUNTER — Other Ambulatory Visit: Payer: Self-pay

## 2019-05-02 DIAGNOSIS — J849 Interstitial pulmonary disease, unspecified: Secondary | ICD-10-CM

## 2019-05-02 MED ORDER — PREDNISONE 20 MG PO TABS: 20.0000 mg | ORAL_TABLET | Freq: Every day | ORAL | 2 refills | Status: DC

## 2019-05-02 MED ORDER — MYCOPHENOLATE MOFETIL 500 MG PO TABS: 500.0000 mg | ORAL_TABLET | Freq: Two times a day (BID) | ORAL | 2 refills | Status: DC

## 2019-05-02 MED ORDER — SULFAMETHOXAZOLE-TRIMETHOPRIM 800-160 MG PO TABS: 1.0000 | ORAL_TABLET | ORAL | 3 refills | Status: DC

## 2019-05-02 NOTE — Patient Instructions (Signed)
We will start you on a medication called CellCept for 500 mg twice daily Bactrim double strength 3 times a week Reduce prednisone to 20 mg Refer to pharmacy to review medication and help manage immunosuppressives Follow-up in 4 weeks.

## 2019-05-02 NOTE — Progress Notes (Signed)
   Interstitial Lung Disease Multidisciplinary Conference   Kathy Irwin    MRN [id number]    DOB 02-18-1951  Primary Care [facility], [place] Cage, NP  Referring Physician: Marshell Garfinkel MD  Time of Conference: 7.30am- 8.30am Date of conference: 05/02/2019 Location of Conference: -  Virtual  Participating Pulmonary: Dr. 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  Treated for many years at [facility] and [place] Has history of positive hypersensitivity panel and known exposures but has moved recently to [place]. Work-up complicated with hospitalization for COVID-19 in September 2019  Serology:  Work-up at [facility] 2013-rheumatoid factor 52, hypersensitivity panel positive for Aspergillus fumigatus, aureobacterium pullulans Negative aldolase, ANA, ENA, SCL 70, Smith, CCP, RNP, SSA  Repeat CTD serologies/16/20-ANA, myositis panel, SCL 70, Ro, La, hypersensitivity panel, ANCA-negative Rheumatoid factor-25  MDD discussion of CT scan   High-resolution CT 02/11/2019-patchy areas of groundglass attenuation, septal thickening bilaterally with no basal gradient.  No traction bronchiectasis or honeycombing.  Moderate air trapping.  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  Findings are suggestive of hypersensitivity pneumonitis  Pathology discussion of biopsy, BAL: Bronchoscopy 04/02/2019-cell count 450, 92% lymphs Microbiology -negative Bronchial biopsies-bronchial mucosa with no significant findings  PFTs:  01/16/2018 FVC 1.76 [60%], FEV1 1.16 (54%), F/F 66, TLC 4.52 (77%), DLCO 27.4 [21%] Poor quality spirometry.  Patient unable to do correct DLCO hence test is uninterpretable   MDD Impression/Recs: Moderate confidence for hypersensitivity pneumonitis per ATS criteria Has prior  history of exposure with positive HP panel in the past although she does not have any ongoing exposures for the past year, repeat HP panel is negative CT scan is compatible with hypersensitivity pneumonitis with lymphocytosis on BAL  Time Spent in preparation and discussion:  > 30 min    SIGNATURE   Fatuma Dowers MD [facility] Please see [email or web address] for pager details.  05/02/2019, 2:12 PM ...................................................................................................................Marland Kitchen References: Diagnosis of Hypersensitivity Pneumonitis in Adults. An Official ATS/JRS/ALAT Clinical Practice Guideline. Ragu G et al, [place] Aug 1;202(3):e36-[id number].

## 2019-05-02 NOTE — Telephone Encounter (Signed)
acknowledged

## 2019-05-02 NOTE — Progress Notes (Signed)
Kathy Irwin    [id number]    1950/07/08  Primary Care Physician:Edwards, [place] Cage, NP  Referring Physician: Kerin Perna, NP [address] 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  Symptoms started in 2012 when she was hospitalized for 7 days with respiratory failure.  Her primary pulmonologist is Dr. Priscille Loveless at [facility].  Work-up included normal echocardiogram, bronchoscopy with transbronchial biopsy in 2012 showing mild chronic interstitial inflammation with no granulomas.  CD4/CD8 ratio of 1.8. PFTs showing mild restriction with TLC 70%, severe diffusion abnormality at 38%.  She is also evaluated at [facility] in [place] where she was referred to cardiothoracic surgery in 2013 for VATS wedge biopsy however since symptoms responded well to prednisone this was deferred.  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  She was evaluated by rheumatology and told there is no evidence of rheumatoid arthritis.  She has been on chronic prednisone since then  History notable for hospitalization at [facility] for acute respiratory failure with pulmonary infiltrates in September 2019 when she was visiting family in [place].  Treated with short course of steroids, bronchodilators  Last seen by Dr. Priscille Loveless on June 25, 2018 for interstitial lung disease of unclear etiology.   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  Bronchoscope was recommended at that time but unable to be scheduled  She moved to [facility] in July 2020 to be closer to her children. Hospitalized for COVID-19 in September 2020.   Treated with steroids, remdesivir and Actemra x2.  She was hospitalized again in September 2020 with hypoxic respiratory failure secondary to ILD flare, acute diastolic heart failure.  Discharged on a slow prednisone taper over 6 weeks.  As she is got of the prednisone her dyspnea is increased again with increasing lower extremity edema.  Apparently she has been of Lasix for several weeks.  Pets: No pets, cats, birds, farm animals Occupation: Used to run a daycare center Exposures: May have mold in the bathroom in her prior house.  No ongoing exposures.  No hot tub, Jacuzzi.  No feather pillows or comforters Smoking history: Never smoker Travel history: Has not lived outside [place].  No significant recent travel Relevant family history: No significant family history of lung disease  Interim history: Continues on prednisone 40 mg a day and Lasix Here to discuss bronchoscopy results.  States that her dyspnea is at baseline  She is being evaluated for thyroidectomy after work-up for thyroid nodules.  Outpatient Encounter Medications as of 05/02/2019  Medication Sig  . acetaminophen (TYLENOL) 500 MG tablet Take 1,000 mg by mouth 2 (two) times daily as needed (for pain).  Marland Kitchen albuterol (PROAIR HFA) 108 (90 Base) MCG/ACT inhaler Inhale 2 puffs into the lungs every 6 (six) hours as needed for wheezing or shortness of breath.  Marland Kitchen albuterol (PROVENTIL) (2.5 MG/3ML) 0.083% nebulizer solution Take 3 mLs (2.5 mg total) by nebulization every 6 (six) hours as needed for wheezing or shortness of breath.  . Azelastine-Fluticasone (DYMISTA) 137-50 MCG/ACT SUSP Place 1-2 sprays into both nostrils daily as needed (for allergies).   . blood glucose meter kit and supplies KIT Dispense based on patient and insurance preference. Use up  to four times daily as directed. (FOR ICD-9 250.00, 250.01). For QAC - HS accuchecks.  Marland Kitchen FLUoxetine (PROZAC) 20 MG tablet TAKE 1 TABLET BY MOUTH EVERY DAY  . furosemide (LASIX) 40  MG tablet Take 40 mg by mouth 2 (two) times daily.  Marland Kitchen gabapentin (NEURONTIN) 300 MG capsule Take 1 capsule (300 mg total) by mouth 2 (two) times daily.  Marland Kitchen glimepiride (AMARYL) 2 MG tablet Take 1 tablet (2 mg total) by mouth daily with breakfast.  . glucose blood (FREESTYLE LITE) test strip For glucose testing every before meals at bedtime. Diagnosis E 11.65  Can substitute to any accepted brand  . guaiFENesin (MUCINEX) 600 MG 12 hr tablet Take 1 tablet (600 mg total) by mouth 2 (two) times daily.  . Insulin Lispro Prot & Lispro (HUMALOG MIX 75/25 KWIKPEN) (75-25) 100 UNIT/ML Kwikpen Inject 80 Units into the skin See admin instructions. Inject 50 units into the skin before breakfast "and a second dose at bedtime 30 units  . Melatonin 1 MG TABS TAKE 5 TABLETS (5 MG TOTAL) BY MOUTH AT BEDTIME AS NEEDED.  Marland Kitchen metolazone (ZAROXOLYN) 2.5 MG tablet Take 1 tablet by mouth daily for 2 days then every other day for 1 week then two times weekly (Mon/Fri). Take 20 minutes before AM lasix  . metoprolol (TOPROL-XL) 200 MG 24 hr tablet Take 200 mg by mouth daily.  Marland Kitchen omeprazole (PRILOSEC) 20 MG capsule Take 1 capsule (20 mg total) by mouth daily.  . pravastatin (PRAVACHOL) 40 MG tablet Take 1 tablet (40 mg total) by mouth every evening.  . predniSONE (DELTASONE) 20 MG tablet TAKE 2 TABLETS (40 MG TOTAL) BY MOUTH DAILY WITH BREAKFAST.  Marland Kitchen sertraline (ZOLOFT) 50 MG tablet Take 1 tablet (50 mg total) by mouth every evening.  Marland Kitchen VITAMIN D PO Take 1 tablet by mouth daily.   No facility-administered encounter medications on file as of 05/02/2019.   Physical Exam: Blood pressure 122/80, pulse 72, temperature 98 F (36.7 C), temperature source Temporal, height 5' 4.5" (1.638 m), weight 268 lb (121.6 kg), SpO2 92 %. Gen:      No acute distress HEENT:  EOMI, sclera anicteric Neck:     No masses; no thyromegaly Lungs:    Clear to auscultation bilaterally; normal respiratory effort CV:         Regular rate and rhythm; no  murmurs Abd:      + bowel sounds; soft, non-tender; no palpable masses, no distension Ext:    No edema; adequate peripheral perfusion Skin:      Warm and dry; no rash Neuro: alert and oriented x 3 Psych: normal mood and affect  Data Reviewed: Imaging: High-resolution CT 02/11/2019-patchy areas of groundglass attenuation, septal thickening bilaterally with no basal gradient.  No traction bronchiectasis or honeycombing.  Moderate air trapping.  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  Labs: Work-up at [facility] 2013-rheumatoid factor 52, hypersensitivity panel positive for Aspergillus fumigatus, aureobacterium pullulans Negative aldolase, ANA, ENA, SCL 70, Smith, CCP, RNP, SSA  Repeat CTD serologies/16/20-ANA, myositis panel, SCL 70, Ro, La, hypersensitivity panel, ANCA-negative Rheumatoid factor-25  Bronchoscopy 04/02/2019-cell count 450, 92% lymphs Microbiology -negative Bronchial biopsies-bronchial mucosa with no significant findings  Cardiac: Echocardiogram 12/24/2018 LVEF 60-65%, mild aortic wall stenosis.  Normal RV systolic function.  PA systolic pressure 44  Outside records from Dr. Priscille Loveless High-res CT chest 02/23/2018-hazy groundglass.  More pronounced on expiration images.  Worsened since 2014.  Findings suggest chronic interstitial pneumonitis consistent with history of hypersensitivity pneumonitis.  PFTs 01/16/2018  FVC 1.76 [60%], FEV1 1.16 (54%), F/F 66, TLC 4.52 (77%), DLCO 27.4 [21%] Poor quality spirometry.  Patient unable to do correct DLCO hence test is uninterpretable  Echo 06/21/2017-LVEF normal, normal RV size and function.  Mild TR.  Sleep study 07/27/2017-no significant sleep apnea although very loud snoring and severe desaturation.  Desaturations responsive to 2 L oxygen.  Assessment:  Interstitial lung disease She has baseline interstitial lung disease of unclear etiology that is steroid responsive.  Differential is CTD associated NSIP fibrosis versus hypersensitivity  pneumonitis.  Noted to have elevated RA but told her she does not have rheumatoid arthritis by rheumatologist.  She recently moved from [facility] in July 2020 and is living in a new house with no apparent ongoing exposures  Clinical picture is complicated by recent ACGBK-47 pneumonia which may have exacerbated her interstitial lung disease. Continues to have significant dyspnea, O2 requirements.  BNP is normal, no evidence of pulmonary hypertension on echocardiogram.  She is not a candidate for surgical lung biopsy given obesity, severely reduced diffusion capacity Underwent bronchoscopy on 12/16 Results discussed at multidisciplinary conference and felt this is hypersensitivity pneumonitis with moderate certainty given CT findings and lymphocytosis on BAL  As she has been unable to get off prednisone I will start her on CellCept Initiate 500 mg twice daily and uptitrate to 1.5 g twice daily Start Bactrim for P JP prophylaxis Taper prednisone to 20 mg a day.  Clearance for total thyroidectomy  Being evaluated for thyroid nodules with concern for malignancy and needs total thyroidectomy She is at increased risk given respiratory failure, interstitial lung disease but no absolute contraindications to surgery.    This appointment required 45 minutes of patient care (this includes precharting, chart review, review of results, face-to-face care, etc.).  Plan/Recommendations: - Reduce prednisone to 20 mg a day - Start CellCept, Bactrim  Marshell Garfinkel MD [facility] 05/02/2019, 2:46 PM  CC: Kerin Perna, NP

## 2019-05-06 ENCOUNTER — Other Ambulatory Visit: Payer: Self-pay

## 2019-05-06 ENCOUNTER — Ambulatory Visit: Payer: Medicare Other | Admitting: Cardiovascular Disease

## 2019-05-06 ENCOUNTER — Encounter: Payer: Self-pay | Admitting: Cardiovascular Disease

## 2019-05-06 DIAGNOSIS — I10 Essential (primary) hypertension: Secondary | ICD-10-CM

## 2019-05-06 DIAGNOSIS — I5031 Acute diastolic (congestive) heart failure: Secondary | ICD-10-CM

## 2019-05-06 DIAGNOSIS — I350 Nonrheumatic aortic (valve) stenosis: Secondary | ICD-10-CM | POA: Diagnosis not present

## 2019-05-06 DIAGNOSIS — Z794 Long term (current) use of insulin: Secondary | ICD-10-CM

## 2019-05-06 DIAGNOSIS — E042 Nontoxic multinodular goiter: Secondary | ICD-10-CM

## 2019-05-06 DIAGNOSIS — E1165 Type 2 diabetes mellitus with hyperglycemia: Secondary | ICD-10-CM

## 2019-05-06 DIAGNOSIS — R600 Localized edema: Secondary | ICD-10-CM

## 2019-05-06 DIAGNOSIS — Z01818 Encounter for other preprocedural examination: Secondary | ICD-10-CM | POA: Diagnosis not present

## 2019-05-06 DIAGNOSIS — J849 Interstitial pulmonary disease, unspecified: Secondary | ICD-10-CM | POA: Diagnosis not present

## 2019-05-06 MED ORDER — METOLAZONE 2.5 MG PO TABS: ORAL_TABLET | ORAL | 5 refills | Status: DC

## 2019-05-06 NOTE — Patient Instructions (Signed)
Medication Instructions:  INCREASE- Metolazone 2.5 mg by mouth three times a week  *If you need a refill on your cardiac medications before your next appointment, please call your pharmacy*  Lab Work: None Ordered  Testing/Procedures: None Ordered  Follow-Up: At [facility], you and your health needs are our priority.  As part of our continuing mission to provide you with exceptional heart care, we have created designated Provider Care Teams.  These Care Teams include your primary Cardiologist (physician) and Advanced Practice Providers (APPs -  Physician Assistants and Nurse Practitioners) who all work together to provide you with the care you need, when you need it.  Your next appointment:   4 month(s)  The format for your next appointment:   In Person  Provider:   Shelva Majestic, MD

## 2019-05-06 NOTE — Progress Notes (Signed)
Cardiology Office Note    Date:  05/08/2019   ID:  Kathy Irwin, DOB 30-Nov-1950, MRN [id number]  PCP:  Kerin Perna, NP  Cardiologist:  Shelva Majestic, MD   F/U cardiology evaluation  History of Present Illness:  Kathy Irwin is a 69 y.o. female who has a history of interstitial lung disease.  She had recently moved from [facility] to [facility] in March 2020 to be close to children.  She was initially hospitalized July 31 through August 7 with COVID-19 action/pneumonia treated with steroids, remdesivir, and Actemra.  She was rehospitalized in September 2020 with hypoxic respiratory failure secondary to interstitial lung disease flare also was felt to have acute diastolic heart failure.  Echo Doppler study on December 24, 2018 showed EF 60-65%, mild LVH, grade 1 diastolic dysfunction, mild mitral annular calcification, mild aortic stenosis with a mean gradient of 13 valve area at 1.7 cm and mild to moderate pulmonary hypertension with PA systolic pressure at 44 mm.  She was discharged on a slow prednisone taper over 6 weeks and has been followed by Dr. Kimber Relic of pulmonology.  On steroid therapy, dyspnea increased and she had progressive lower extremity edema.  She was last evaluated by Dr. Rolla Etienne on March 21, 2019.  She had recently been started on prednisone at high-dose and was on Lasix for edema.  She has been on supplemental oxygen at 3 L.  She was scheduled to undergo video bronchoscopy with Dr. Vaughan Browner on April 02, 2019.  She is referred for cardiology evaluation.  Patient admits to at least a 10-year history of hypertension as well as a history of insulin-dependent diabetes mellitus.  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  She has been on chronic prednisone therapy since.  When I saw her for initial evaluation on March 25, 2019 she denied any chest tightness.  She was having significant leg swelling and admitted to exertional dyspnea.  She stated that she had gained 10 pounds of weight secondary to progressive lower extremity edema.  She continued to be on 3 L of oxygen.  With her tense lower extremity edema I recommended the addition of metolazone 2.5 mg to be taken 20 minutes prior to her morning Lasix dose for 2 days and then change this to every other day with eventual once or twice per week depending upon response.  Underwent an echo Doppler study on April 22, 2019 which showed an EF of 55 to 60%.  There was mild LVH.  There were no regional wall motion abnormalities.  There was evidence for mild aortic stenosis with a mean gradient of 11 and peak gradient of 21.3.  Estimated aortic valve area was 1.7 cm.  There was moderate elevation of pulmonary artery systolic pressure at 50.0.  Recently had seen Dr. Armandina Gemma for multiple thyroid nodules.  She underwent fine-needle aspiration which revealed cytologic atypia molecular genetic testing risk of malignancy was 50%.  There are bilateral nodules, she is now sent for cardiology clearance with me and also is planned to undergo pulmonary clearance with Dr. Kimber Relic as well as hematologic clearance regarding her thrombocytopenia.  Kathy Irwin was recently started on CellCept in addition  in addition to her prednisone.  He is to experience lower extremity edema.  She denies chest pain or awareness of arrhythmia.  She presents for evaluation  Past Medical History:  Diagnosis Date   Acute respiratory failure with hypoxia ([place]) 12/2018   Diabetes mellitus without complication (HCC)    Hypersensitivity pneumonitis ([facility]) 12/19/2017   Hypertension    ILD (interstitial lung disease) ([place]) 12/24/2018     Past Surgical History:  Procedure Laterality Date   ABDOMINAL HYSTERECTOMY     VIDEO BRONCHOSCOPY Bilateral 04/02/2019   Procedure: VIDEO BRONCHOSCOPY WITH FLUORO;  Surgeon: Marshell Garfinkel, MD;  Location: [facility];  Service: Cardiopulmonary;  Laterality: Bilateral;    Current Medications: Outpatient Medications Prior to Visit  Medication Sig Dispense Refill   acetaminophen (TYLENOL) 500 MG tablet Take 1,000 mg by mouth 2 (two) times daily as needed (for pain).     albuterol (PROAIR HFA) 108 (90 Base) MCG/ACT inhaler Inhale 2 puffs into the lungs every 6 (six) hours as needed for wheezing or shortness of breath. 6.7 g 1   albuterol (PROVENTIL) (2.5 MG/3ML) 0.083% nebulizer solution Take 3 mLs (2.5 mg total) by nebulization every 6 (six) hours as needed for wheezing or shortness of breath. 75 mL 1   Azelastine-Fluticasone (DYMISTA) 137-50 MCG/ACT SUSP Place 1-2 sprays into both nostrils daily as needed (for allergies).      blood glucose meter kit and supplies KIT Dispense based on patient and insurance preference. Use up to four times daily as directed. (FOR ICD-9 250.00, 250.01). For QAC - HS accuchecks. 1 each 1   FLUoxetine (PROZAC) 20 MG tablet TAKE 1 TABLET BY MOUTH EVERY DAY 90 tablet 2   furosemide (LASIX) 40 MG tablet Take 40 mg by mouth 2 (two) times daily.     gabapentin (NEURONTIN) 300 MG capsule Take 1 capsule (300 mg total) by mouth 2 (two) times daily. 60 capsule 3   glimepiride (AMARYL) 2 MG tablet Take 1 tablet (2 mg total) by mouth daily with breakfast. 30 tablet 3   glucose blood (FREESTYLE LITE) test strip For glucose testing every before meals at bedtime. Diagnosis E 11.65  Can substitute to any accepted brand 100 each 0   guaiFENesin (MUCINEX) 600 MG 12 hr tablet Take 1 tablet (600 mg total) by mouth 2 (two) times daily. 60 tablet 0   Insulin Lispro Prot & Lispro (HUMALOG MIX 75/25 KWIKPEN) (75-25) 100 UNIT/ML Kwikpen Inject 80 Units into the skin  See admin instructions. Inject 50 units into the skin before breakfast "and a second dose at bedtime 30 units 30 mL 3   Melatonin 1 MG TABS TAKE 5 TABLETS (5 MG TOTAL) BY MOUTH AT BEDTIME AS NEEDED. 90 tablet 1   metoprolol (TOPROL-XL) 200 MG 24 hr tablet Take 200 mg by mouth daily.     mycophenolate (CELLCEPT) 500 MG tablet Take 1 tablet (500 mg total) by mouth 2 (two) times daily. 60 tablet 2   omeprazole (PRILOSEC) 20 MG capsule Take 1 capsule (20 mg total) by mouth daily. 30 capsule 0   pravastatin (PRAVACHOL) 40 MG tablet Take 1 tablet (40 mg total) by mouth every evening. 90 tablet 3   predniSONE (DELTASONE) 20 MG tablet Take 1 tablet (20 mg total) by mouth daily with breakfast. 30 tablet 2   sertraline (ZOLOFT) 50 MG tablet Take 1 tablet (50 mg total) by mouth every evening. 30 tablet 3   sulfamethoxazole-trimethoprim (BACTRIM DS) 800-160 MG tablet Take 1 tablet by mouth 3 (  a week. 12 tablet 3   VITAMIN D PO Take 1 tablet by mouth daily.     metolazone (ZAROXOLYN) 2.5 MG tablet Take 1 tablet by mouth daily for 2 days then every other day for 1 week then two times weekly (Mon/Fri). Take 20 minutes before AM lasix 30 tablet 5   No facility-administered medications prior to visit.     Allergies:   Other, Iodine, and Merbromin   Social History   Socioeconomic History   Marital status: Married    Spouse name: Not on file   Number of children: Not on file   Years of education: Not on file   Highest education level: Not on file  Occupational History   Not on file  Tobacco Use   Smoking status: Never Smoker   Smokeless tobacco: Never Used  Substance and Sexual Activity   Alcohol use: Never   Drug use: Never   Sexual activity: Not on file  Other Topics Concern   Not on file  Social History Narrative   Not on file   Social Determinants of Health   Financial Resource Strain:    Difficulty of Paying Living Expenses: Not on file  Food  Insecurity:    Worried About [place] in the Last Year: Not on file   Ran Out of Food in the Last Year: Not on file  Transportation Needs:    Lack of Transportation (Medical): Not on file   Lack of Transportation (Non-Medical): Not on file  Physical Activity:    Days of Exercise per Week: Not on file   Minutes of Exercise per Session: Not on file  Stress:    Feeling of Stress : Not on file  Social Connections:    Frequency of Communication with Friends and Family: Not on file   Frequency of Social Gatherings with Friends and Family: Not on file   Attends Religious Services: Not on file   Active Member of Clubs or Organizations: Not on file   Attends Archivist Meetings: Not on file   Marital Status: Not on file    Additional social history is notable that she was born in [place].  She had lived many years in [place].  She had moved to [place] to be close to family.  He is married and her husband is in a nursing home in [place].  She has 4 children ages 50, 29, 47, and 39.  Family History:  The patient's family history includes Diabetes in an other family member; Hypertension in an other family member.   Her father died at age 76, her mother died at age 54.  She has 4 sisters, and 2 brothers.  One brother died with AIDS.  ROS General: Negative; No fevers, chills, or night sweats;  HEENT: Negative; No changes in vision or hearing, sinus congestion, difficulty swallowing Pulmonary: Positive for interstitial lung disease; positive for hospitalization x2 for COVID-19 pneumonia and ILD exacerbation Cardiovascular: See HPI; progressive lower extremity edema GI: Negative; No nausea, vomiting, diarrhea, or abdominal pain GU: Negative; No dysuria, hematuria, or difficulty voiding Musculoskeletal: Negative; no myalgias, joint pain, or weakness Hematologic/Oncology: Negative; no easy bruising, bleeding Endocrine:  Positive for insulin-dependent diabetes mellitus; positive for bilateral thyroid nodules Neuro: Negative; no changes in balance, headaches Skin: Negative; No rashes or skin lesions Psychiatric: Negative; No behavioral problems, depression Sleep: Positive for loud snoring, no daytime sleepiness, hypersomnolence, bruxism, restless legs, hypnogognic hallucinations, no cataplexy Other comprehensive  Other comprehensive 14 point system review is negative.   PHYSICAL EXAM:   VS:  BP (!) 173/90    Pulse 72    Ht 5' 4.5" (1.638 m)    Wt 267 lb (121.1 kg)    SpO2 93%    BMI 45.12 kg/m     Repeat blood pressure by me was improved at 136/86; the above weight is inaccurate.  The patient was reweighed and weighed 267 pounds.  Wt Readings from Last 3 Encounters:  05/06/19 267 lb (121.1 kg)  05/02/19 268 lb (121.6 kg)  04/23/19 271 lb 4.8 oz (123.1 kg)    General: Alert, oriented, no distress.  Skin: normal turgor, no rashes, warm and dry HEENT: Normocephalic, atraumatic. Pupils equal round and reactive to light; sclera anicteric; extraocular muscles intact;  Nose without nasal septal hypertrophy Mouth/Parynx benign; Mallinpatti scale 3 Neck: No JVD, no carotid bruits; normal carotid upstroke Lungs: clear to ausculatation and percussion; no wheezing or rales Chest wall: without tenderness to palpitation Heart: PMI not displaced, RRR, s1 s2 normal, 1/6 systolic murmur, no diastolic murmur, no rubs, gallops, thrills, or heaves Abdomen: soft, nontender; no hepatosplenomehaly, BS+; abdominal aorta nontender and not dilated by palpation. Back: no CVA tenderness Pulses 2+ Musculoskeletal: full range of motion, normal strength, no joint deformities Extremities: Lateral lower extremity edema 1-2+ no clubbing cyanosis or edema, Homan's sign negative  Neurologic: grossly nonfocal; Cranial nerves grossly wnl Psychologic: Normal mood and affect   Studies/Labs Reviewed:   EKG:  EKG is ordered today.  ECG (independently read  by me): Normal sinus rhythm at 72 bpm.  Borderline voltage criteria for LVH.  Normal intervals.  No ectopy    December 2020 ECG (independently read by me): Sinus rhythm at 89 bpm, PAC, LVH by voltage.  QTc interval 450 ms  Recent Labs: BMP Latest Ref Rng & Units 04/23/2019 04/04/2019 03/21/2019  Glucose 70 - 99 mg/dL 109(H) 121(H) 105(H)  BUN 8 - 23 mg/dL _0 Creatinine 0.44 - 1.00 mg/dL 0.78 0.74 0.73  BUN/Creat Ratio 12 - 28 - 20 -  Sodium 135 - 145 mmol/L 143 140 145  Potassium 3.5 - 5.1 mmol/L 4.8 3.6 4.0  Chloride 98 - 111 mmol/L 107 99 107  CO2 22 - 32 mmol/L _1 Calcium 8.9 - 10.3 mg/dL 9.2 9.9 9.7     Hepatic Function Latest Ref Rng & Units 04/23/2019 04/04/2019 03/03/2019  Total Protein 6.5 - 8.1 g/dL 6.5 6.7 7.0  Albumin 3.5 - 5.0 g/dL 3.9 4.2 4.3  AST 15 - 41 U/L _2 ALT 0 - 44 U/L _3 Alk Phosphatase 38 - 126 U/L 56 69 80  Total Bilirubin 0.3 - 1.2 mg/dL 1.3(H) 0.8 1.2  Bilirubin, Direct 0.0 - 0.2 mg/dL - - -    CBC Latest Ref Rng & Units 04/23/2019 04/04/2019 03/21/2019  WBC 4.0 - 10.5 K/uL 4.5 6.4 8.7  Hemoglobin 12.0 - 15.0 g/dL 12.4 12.5 12.6  Hematocrit 36.0 - 46.0 % 38.6 38.2 39.2  Platelets 150 - 400 K/uL 57(L) 78(LL) 83.0 Repeated and verified X2.(L)   Lab Results  Component Value Date   MCV 97.5 04/23/2019   MCV 97 04/04/2019   MCV 99.3 03/21/2019   Lab Results  Component Value Date   TSH 2.060 04/04/2019   Lab Results  Component Value Date   HGBA1C 8.4 (H) 04/04/2019     BNP    Component Value Date/Time  BNP 21.0 12/23/2018 1446    ProBNP    Component Value Date/Time   PROBNP 70.0 03/03/2019 1151     Lipid Panel     Component Value Date/Time   CHOL 166 04/04/2019 1219   TRIG 80 04/04/2019 1219   HDL 48 04/04/2019 1219   CHOLHDL 3.5 04/04/2019 1219   LDLCALC 103 (H) 04/04/2019 1219   LABVLDL 15 04/04/2019 1219     RADIOLOGY: ECHOCARDIOGRAM COMPLETE  Result Date: 04/22/2019   ECHOCARDIOGRAM REPORT    Patient Name:   Kathy Irwin Date of Exam: 04/22/2019 Medical Rec #:  [id number]     Height:       64.5 in Accession #:    [id number]    Weight:       260.0 lb Date of Birth:  January 10, 1951      BSA:          2.20 m Patient Age:    7 years      BP:           139/74 mmHg Patient Gender: F             HR:           67 bpm. Exam Location:  [facility] Procedure: 2D Echo, Cardiac Doppler and Color Doppler Indications:    I50.31  History:        Patient has prior history of Echocardiogram examinations, most                 recent 12/24/2018. CHF, Mild AS; Risk Factors:Morbid obesity,                 Hypertension, Diabetes and Dyslipidemia.  Sonographer:    Coralyn Helling RDCS Referring Phys: [place] Comments: Technically difficult study due to poor echo windows, suboptimal parasternal window, no subcostal window and patient is morbidly obese. Image acquisition challenging due to patient body habitus. IMPRESSIONS  1. Left ventricular ejection fraction, by visual estimation, is 55 to 60%. The left ventricle has normal function. There is mildly increased left ventricular hypertrophy.  2. The left ventricle has no regional wall motion abnormalities.  3. Global right ventricle has normal systolic function.The right ventricular size is normal. No increase in right ventricular wall thickness.  4. Left atrial size was normal.  5. Right atrial size was normal.  6. The mitral valve is normal in structure. No evidence of mitral valve regurgitation. No evidence of mitral stenosis.  7. The tricuspid valve is normal in structure.  8. The aortic valve is normal in structure. Aortic valve regurgitation is mild. Mild aortic valve stenosis.  9. The pulmonic valve was normal in structure. Pulmonic valve regurgitation is not visualized. 10. Moderately elevated pulmonary artery systolic pressure. 11. The inferior vena cava is normal in size with greater than 50% respiratory variability, suggesting right atrial pressure  of 3 mmHg. FINDINGS  Left Ventricle: Left ventricular ejection fraction, by visual estimation, is 55 to 60%. The left ventricle has normal function. The left ventricle has no regional wall motion abnormalities. There is mildly increased left ventricular hypertrophy. Normal left atrial pressure. Right Ventricle: The right ventricular size is normal. No increase in right ventricular wall thickness. Global RV systolic function is has normal systolic function. The tricuspid regurgitant velocity is 2.97 m/s, and with an assumed right atrial pressure  of 10 mmHg, the estimated right ventricular systolic pressure is moderately elevated at 45.3 mmHg. Left Atrium: Left atrial size was normal in  size. Right Atrium: Right atrial size was normal in size Pericardium: There is no evidence of pericardial effusion. Mitral Valve: The mitral valve is normal in structure. No evidence of mitral valve regurgitation. No evidence of mitral valve stenosis by observation. Tricuspid Valve: The tricuspid valve is normal in structure. Tricuspid valve regurgitation is trivial. Aortic Valve: The aortic valve is normal in structure.. There is mild thickening and moderate calcification of the aortic valve. Aortic valve regurgitation is mild. Aortic regurgitation PHT measures 544 msec. Mild aortic stenosis is present. There is mild thickening of the aortic valve. There is moderate calcification of the aortic valve. Aortic valve mean gradient measures 11.0 mmHg. Aortic valve peak gradient measures 21.3 mmHg. Aortic valve area, by VTI measures 1.66 cm. Pulmonic Valve: The pulmonic valve was normal in structure. Pulmonic valve regurgitation is not visualized. Pulmonic regurgitation is not visualized. Aorta: The aortic root, ascending aorta and aortic arch are all structurally normal, with no evidence of dilitation or obstruction. Venous: The inferior vena cava is normal in size with greater than 50% respiratory variability, suggesting right atrial  pressure of 3 mmHg. IAS/Shunts: No atrial level shunt detected by color flow Doppler. There is no evidence of a patent foramen ovale. No ventricular septal defect is seen or detected. There is no evidence of an atrial septal defect.  LEFT VENTRICLE PLAX 2D LVIDd:         4.60 cm  Diastology LVIDs:         3.10 cm  LV e' lateral:   8.16 cm/s LV PW:         1.30 cm  LV E/e' lateral: 11.2 LV IVS:        1.30 cm  LV e' medial:    6.53 cm/s LVOT diam:     2.10 cm  LV E/e' medial:  14.0 LV SV:         59 ml LV SV Index:   24.95 LVOT Area:     3.46 cm  RIGHT VENTRICLE RV S prime:     10.90 cm/s TAPSE (M-mode): 2.2 cm RVSP:           38.3 mmHg LEFT ATRIUM             Index       RIGHT ATRIUM           Index LA diam:        3.60 cm 1.64 cm/m  RA Pressure: 3.00 mmHg LA Vol (A2C):   52.1 ml 23.69 ml/m RA Area:     17.10 cm LA Vol (A4C):   54.4 ml 24.74 ml/m RA Volume:   45.50 ml  20.69 ml/m LA Biplane Vol: 55.2 ml 25.10 ml/m  AORTIC VALVE AV Area (Vmax):    1.59 cm AV Area (Vmean):   1.59 cm AV Area (VTI):     1.66 cm AV Vmax:           230.50 cm/s AV Vmean:          157.000 cm/s AV VTI:            0.486 m AV Peak Grad:      21.3 mmHg AV Mean Grad:      11.0 mmHg LVOT Vmax:         106.00 cm/s LVOT Vmean:        72.100 cm/s LVOT VTI:          0.233 m LVOT/AV VTI ratio: 0.48 AI PHT:  544 msec  AORTA Ao Root diam: 3.00 cm Ao Asc diam:  3.70 cm MITRAL VALVE                        TRICUSPID VALVE MV Area (PHT):                      TR Peak grad:   35.3 mmHg                                     TR Vmax:        308.00 cm/s MV Decel Time: 256 msec             Estimated RAP:  3.00 mmHg MV E velocity: 91.30 cm/s 103 cm/s  RVSP:           38.3 mmHg MV A velocity: 84.80 cm/s 70.3 cm/s MV E/A ratio:  1.08       1.5       SHUNTS                                     Systemic VTI:  0.23 m                                     Systemic Diam: 2.10 cm  Candee Furbish MD Electronically signed by Candee Furbish MD Signature Date/Time:  04/22/2019/4:13:06 PM    Final      Additional studies/ records that were reviewed today include:  I reviewed the lobe our pulmonary records from Dr. Vaughan Browner.  I reviewed the patient's hospitalization records with her Covid 19 pneumonia and readmission.  ECHO: 12/24/2018 IMPRESSIONS  1. The left ventricle has normal systolic function with an ejection fraction of 60-65%. The cavity size was normal. There is mildly increased left ventricular wall thickness. Left ventricular diastolic Doppler parameters are consistent with impaired  relaxation. No evidence of left ventricular regional wall motion abnormalities.  2. There is mild mitral annular calcification present. No evidence of mitral valve stenosis. Trivial mitral regurgitation.  3. The aortic valve is tricuspid. Mild calcification of the aortic valve. Aortic valve regurgitation is trivial by color flow Doppler. Mild stenosis of the aortic valve. Mean gradient 13 mmHg, AVA 1.7 cm^2. Small mobile calcification versus Lambl's  excrescence seen on the aortic valve, less likely vegetation.  4. The right ventricle has normal systolic function. The cavity was normal. There is no increase in right ventricular wall thickness.  5. Normal IVC size. PA systolic pressure 44 mmHg.  6. The aortic root is normal in size and structure.   ECHO 04/22/2019 IMPRESSIONS  1. Left ventricular ejection fraction, by visual estimation, is 55 to 60%. The left ventricle has normal function. There is mildly increased left ventricular hypertrophy.  2. The left ventricle has no regional wall motion abnormalities.  3. Global right ventricle has normal systolic function.The right ventricular size is normal. No increase in right ventricular wall thickness.  4. Left atrial size was normal.  5. Right atrial size was normal.  6. The mitral valve is normal in structure. No evidence of mitral valve regurgitation. No evidence of mitral stenosis.  7. The tricuspid valve is normal in  structure.  8. The  aortic valve is normal in structure. Aortic valve regurgitation is mild. Mild aortic valve stenosis.  9. The pulmonic valve was normal in structure. Pulmonic valve regurgitation is not visualized. 10. Moderately elevated pulmonary artery systolic pressure. 11. The inferior vena cava is normal in size with greater than 50% respiratory variability, suggesting right atrial pressure of 3 mmHg.   ASSESSMENT:    1. Preoperative clearance   2. Essential hypertension   3. ILD (interstitial lung disease) ([facility])   4. Mild aortic stenosis   5. Bilateral lower extremity edema   6. Chronic diastolic heart failure ([facility])   7. Type 2 diabetes mellitus with hyperglycemia, with long-term current use of insulin ([facility])   8. Multiple thyroid nodules      PLAN:  Kathy Irwin is a 69 year old female who has a history of interstitial lung disease followed by Dr. Vaughan Browner and has been on chronic steroids.  Apparently her symptoms initiated in 2012 when she was hospitalized for 7 days with respiratory failure.  Work-up at that time reportedly demonstrated a normal echocardiogram in addition to bronchoscopy and transbronchial biopsy which showed mild chronic interstitial inflammation without granulomas.  During her recent readmission for acute on chronic respiratory failure with hypoxia in September she required increasing steroid therapy.  An echo Doppler study showed normal systolic function with EF 60 to 02%, grade 1 diastolic dysfunction, mitral annular calcification, as well as mild aortic stenosis with a mean gradient of 13 and estimated valve area of 1.7 cm.  When I saw her for initial evaluation in December 2020, blood pressure was elevated, and despite taking furosemide 40 mg twice a day, metoprolol succinate 200 mg daily she had tense lower extremity edema and I recommended addition of low-dose metolazone 2.5 mg for 2 days and then every other day for 1 week and then 2 times weekly.  This  has resulted in some weight loss but she continues to have lower extremity edema today which is significant.  She was recently started on CellCept in addition to her prednisone for her interstitial lung disease.  She is in need for thyroid surgery for which she has seen Dr. Armandina Gemma.  Her most recent echo Doppler study following her Covid when he continues to show normal LV systolic function with EF at 55 to 60%, mild LVH and unchanged mild aortic valve stenosis.  As long as she has significant tense edema I have suggested trying to increase metolazone to 3 times per week and have again recommended she take this at least 20 minutes prior to her morning Lasix dose which she was not doing.  He continues to be on lipid-lowering therapy with pravastatin.  She is diabetic on insulin in addition to glimepiride.  From a cardiac perspective, appears stable to undergo thyroid surgery and I will give cardiac clearance for this.  She will be following up with Dr. Kimber Relic as well as her hematologist prior to follow-up evaluation with Dr. Harlow Asa.    Medication Adjustments/Labs and Tests Ordered: Current medicines are reviewed at length with the patient today.  Concerns regarding medicines are outlined above.  Medication changes, Labs and Tests ordered today are listed in the Patient Instructions below. Patient Instructions  Medication Instructions:  INCREASE- Metolazone 2.5 mg by mouth three times a week  *If you need a refill on your cardiac medications before your next appointment, please call your pharmacy*  Lab Work: None Ordered  Testing/Procedures: None Ordered  Follow-Up: At [facility], you and your health  needs are our priority.  As part of our continuing mission to provide you with exceptional heart care, we have created designated Provider Care Teams.  These Care Teams include your primary Cardiologist (physician) and Advanced Practice Providers (APPs -  Physician Assistants and Nurse  Practitioners) who all work together to provide you with the care you need, when you need it.  Your next appointment:   4 month(s)  The format for your next appointment:   In Person  Provider:   Shelva Majestic, MD      Signed, Shelva Majestic, MD  05/08/2019 6:49 PM    [facility] [address] [place], [place], [place]  [address] Phone: [phone]

## 2019-05-08 ENCOUNTER — Encounter: Payer: Self-pay | Admitting: Cardiovascular Disease

## 2019-05-12 ENCOUNTER — Ambulatory Visit (INDEPENDENT_AMBULATORY_CARE_PROVIDER_SITE_OTHER): Payer: Medicare Other | Admitting: Primary Care

## 2019-05-14 ENCOUNTER — Other Ambulatory Visit (INDEPENDENT_AMBULATORY_CARE_PROVIDER_SITE_OTHER): Payer: Self-pay | Admitting: Primary Care

## 2019-05-15 ENCOUNTER — Ambulatory Visit (INDEPENDENT_AMBULATORY_CARE_PROVIDER_SITE_OTHER): Payer: Medicare Other | Admitting: Primary Care

## 2019-05-15 ENCOUNTER — Encounter (INDEPENDENT_AMBULATORY_CARE_PROVIDER_SITE_OTHER): Payer: Self-pay | Admitting: Primary Care

## 2019-05-15 ENCOUNTER — Other Ambulatory Visit: Payer: Self-pay

## 2019-05-15 DIAGNOSIS — Z760 Encounter for issue of repeat prescription: Secondary | ICD-10-CM

## 2019-05-15 DIAGNOSIS — E119 Type 2 diabetes mellitus without complications: Secondary | ICD-10-CM

## 2019-05-15 DIAGNOSIS — K2100 Gastro-esophageal reflux disease with esophagitis, without bleeding: Secondary | ICD-10-CM | POA: Diagnosis not present

## 2019-05-15 LAB — ACID FAST CULTURE WITH REFLEXED SENSITIVITIES (MYCOBACTERIA): Acid Fast Culture: NEGATIVE

## 2019-05-15 MED ORDER — AZELASTINE-FLUTICASONE 137-50 MCG/ACT NA SUSP: 1.0000 | Freq: Every day | NASAL | 1 refills | Status: DC | PRN

## 2019-05-15 MED ORDER — GABAPENTIN 300 MG PO CAPS: 300.0000 mg | ORAL_CAPSULE | Freq: Three times a day (TID) | ORAL | 1 refills | Status: DC

## 2019-05-15 MED ORDER — INSULIN LISPRO PROT & LISPRO (75-25 MIX) 100 UNIT/ML KWIKPEN: 80.0000 [IU] | PEN_INJECTOR | SUBCUTANEOUS | 3 refills | Status: DC

## 2019-05-15 MED ORDER — ALBUTEROL SULFATE HFA 108 (90 BASE) MCG/ACT IN AERS: 2.0000 | INHALATION_SPRAY | Freq: Four times a day (QID) | RESPIRATORY_TRACT | 1 refills | Status: DC | PRN

## 2019-05-15 MED ORDER — GLIMEPIRIDE 2 MG PO TABS: 2.0000 mg | ORAL_TABLET | Freq: Every day | ORAL | 1 refills | Status: DC

## 2019-05-15 NOTE — Progress Notes (Signed)
Patient fasting CBG at 11:15 is 140  Pt states she needs something stronger for acid reflux other than the omeprazole  Pt would like to know if there is something else she can take for sciatic nerve pain

## 2019-05-25 NOTE — Progress Notes (Signed)
Virtual Visit via Telephone Note  I connected with Kathy Irwin on 05/25/19 at 11:10 AM EST by telephone and verified that I am speaking with the correct person using two identifiers.   I discussed the limitations, risks, security and privacy concerns of performing an evaluation and management service by telephone and the availability of in person appointments. I also discussed with the patient that there may be a patient responsible charge related to this service. The patient expressed understanding and agreed to proceed.  History of Present Illness: [name] is having a tele visit on her management of type 2 diabetes . States blood sugar before breakfast today was 140. Discussed how to use the < to few previous numbers. On follow up she will be able to provide a range. She has complaints of increasing heartburn and the omeprazole is no longer working. She voices no other complaints or concerns. Past Medical History:  Diagnosis Date  . Acute respiratory failure with hypoxia ([place]) 12/2018  . Diabetes mellitus without complication ([place])   . 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 Hypertension   . ILD (interstitial lung disease) ([place]) 12/24/2018   Current Outpatient Medications on File Prior to Visit  Medication Sig Dispense Refill  . blood glucose meter kit and supplies KIT Dispense based on patient and insurance preference. Use up to four times daily as directed. (FOR ICD-9 250.00, 250.01). For QAC - HS accuchecks. 1 each 1  . FLUoxetine (PROZAC) 20 MG tablet TAKE 1 TABLET BY MOUTH EVERY DAY 90 tablet 2  . furosemide (LASIX) 40 MG tablet Take 40 mg by mouth 2 (two) times daily.    Marland Kitchen glucose blood (FREESTYLE LITE) test strip For glucose testing every before meals at bedtime. Diagnosis E 11.65  Can substitute to any accepted brand 100 each 0  . metolazone (ZAROXOLYN) 2.5 MG tablet Take 2.5 mg by mouth three times a week, take 30 minutes before taking lasix 30 tablet 5  .  metoprolol (TOPROL-XL) 200 MG 24 hr tablet Take 200 mg by mouth daily.    . mycophenolate (CELLCEPT) 500 MG tablet Take 1 tablet (500 mg total) by mouth 2 (two) times daily. 60 tablet 2  . pravastatin (PRAVACHOL) 40 MG tablet Take 1 tablet (40 mg total) by mouth every evening. 90 tablet 3  . predniSONE (DELTASONE) 20 MG tablet Take 1 tablet (20 mg total) by mouth daily with breakfast. 30 tablet 2  . sertraline (ZOLOFT) 50 MG tablet Take 1 tablet (50 mg total) by mouth every evening. 30 tablet 3  . sulfamethoxazole-trimethoprim (BACTRIM DS) 800-160 MG tablet Take 1 tablet by mouth 3 (three) times a week. 12 tablet 3  . VITAMIN D PO Take 1 tablet by mouth daily.    Marland Kitchen acetaminophen (TYLENOL) 500 MG tablet Take 1,000 mg by mouth 2 (two) times daily as needed (for pain).    Marland Kitchen omeprazole (PRILOSEC) 20 MG capsule Take 1 capsule (20 mg total) by mouth daily. (Patient not taking: Reported on 05/15/2019) 30 capsule 0   No current facility-administered medications on file prior to visit.   Observations/Objective: Review of Systems  Gastrointestinal: Positive for abdominal pain and heartburn.  All other systems reviewed and are negative.   Assessment and Plan: Kathy Irwin was seen today for diabetes and medication refill.  Diagnoses and all orders for this visit:  Type 2 diabetes mellitus without complication, without long-term current use of insulin (HCC) Goal of therapy: Less than 6.5 hemoglobin A1c.   Foods that are  high in carbohydrates are the following rice, potatoes, breads, sugars, and pastas.  Reduction in the intake (eating) will assist in lowering your blood sugars.  Gastroesophageal reflux disease with esophagitis without hemorrhage Discussed eating small frequent meal, reduction in acidic foods, fried foods ,spicy foods, alcohol caffeine and tobacco and certain medications. Avoid laying down after eating 85mns-1hour, elevated head of the bed. Discontinued omeprazole start on Protonix 20 mg  daily  Other orders/Medication refill -     gabapentin (NEURONTIN) 300 MG capsule; Take 1 capsule (300 mg total) by mouth 3 (three) times daily. -     Insulin Lispro Prot & Lispro (HUMALOG MIX 75/25 KWIKPEN) (75-25) 100 UNIT/ML Kwikpen; Inject 80 Units into the skin See admin instructions. Inject 50 units into the skin before breakfast "and a second dose at bedtime 30 units -     albuterol (PROAIR HFA) 108 (90 Base) MCG/ACT inhaler; Inhale 2 puffs into the lungs every 6 (six) hours as needed for wheezing or shortness of breath. -     Azelastine-Fluticasone (DYMISTA) 137-50 MCG/ACT SUSP; Place 1-2 sprays into both nostrils daily as needed (for allergies). -     glimepiride (AMARYL) 2 MG tablet; Take 1 tablet (2 mg total) by mouth daily with breakfast.    Follow Up Instructions:    I discussed the assessment and treatment plan with the patient. The patient was provided an opportunity to ask questions and all were answered. The patient agreed with the plan and demonstrated an understanding of the instructions.   The patient was advised to call back or seek an in-person evaluation if the symptoms worsen or if the condition fails to improve as anticipated.  I provided 14 minutes of non-face-to-face time during this encounter.   MKerin Perna NP

## 2019-06-09 NOTE — Progress Notes (Signed)
*  Patient seen by the pharmacy team in conjunction with Dr. Vaughan Browner.  Please refer to office visit encounter on 06/11/2019 for provider assessment and plan.*

## 2019-06-11 ENCOUNTER — Encounter: Payer: Self-pay | Admitting: Pulmonary Disease

## 2019-06-11 ENCOUNTER — Other Ambulatory Visit: Payer: Self-pay

## 2019-06-11 ENCOUNTER — Ambulatory Visit: Payer: Medicare Other | Admitting: Pulmonary Disease

## 2019-06-11 ENCOUNTER — Ambulatory Visit (INDEPENDENT_AMBULATORY_CARE_PROVIDER_SITE_OTHER): Payer: Medicare Other | Admitting: Pharmacist

## 2019-06-11 VITALS — BP 126/80 | HR 76 | Temp 98.0°F

## 2019-06-11 DIAGNOSIS — R0600 Dyspnea, unspecified: Secondary | ICD-10-CM | POA: Diagnosis not present

## 2019-06-11 DIAGNOSIS — Z79899 Other long term (current) drug therapy: Secondary | ICD-10-CM

## 2019-06-11 DIAGNOSIS — J849 Interstitial pulmonary disease, unspecified: Secondary | ICD-10-CM

## 2019-06-11 NOTE — Progress Notes (Addendum)
Kathy Irwin    [id number]    1950/05/24  Primary Care Physician:Edwards, [place] Cage, NP  Referring Physician: Kerin Perna, NP [address] 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  Symptoms started in 2012 when she was hospitalized for 7 days with respiratory failure.  Her primary pulmonologist is Dr. Priscille Loveless at [facility].  Work-up included normal echocardiogram, bronchoscopy with transbronchial biopsy in 2012 showing mild chronic interstitial inflammation with no granulomas.  CD4/CD8 ratio of 1.8. PFTs showing mild restriction with TLC 70%, severe diffusion abnormality at 38%.  She is also evaluated at [facility] in [place] where she was referred to cardiothoracic surgery in 2013 for VATS wedge biopsy however since symptoms responded well to prednisone this was deferred.  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  She was evaluated by rheumatology and told there is no evidence of rheumatoid arthritis.  She has been on intermittent prednisone since then  History notable for hospitalization at [facility] for acute respiratory failure with pulmonary infiltrates in September 2019 when she was visiting family in [place].  Treated with short course of steroids, bronchodilators  Last seen by Dr. Priscille Loveless on June 25, 2018 for interstitial lung disease of unclear etiology.   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  Bronchoscope was recommended at that time but unable to be scheduled  She moved to [facility] in July  2020 to be closer to her children. Hospitalized for COVID-19 in September 2020.  Treated with steroids, remdesivir and Actemra x2.  She was hospitalized again in September 2020 with hypoxic respiratory failure secondary to ILD flare, acute diastolic heart failure.  Discharged on a slow prednisone taper over 6 weeks.  As she is got of the prednisone her dyspnea is increased again with increasing lower extremity edema.    Put back on prednisone and underwent bronchoscope on 04/02/2019 with lymphocytosis on BAL, transbronchial lung biopsies which were benign no granulomas Discussed at ILD conference on 04/26/2019 given diagnosis of chronic hypersensitivity pneumonitis.  Pets: No pets, cats, birds, farm animals Occupation: Used to run a daycare center Exposures: May have mold in the bathroom in her prior house.  No ongoing exposures.  No hot tub, Jacuzzi.  No feather pillows or comforters Smoking history: Never smoker Travel history: Has not lived outside [place].  No significant recent travel Relevant family history: No significant family history of lung disease  Interim history: Prednisone taper to 20 mg a day. She continues on Lasix.  Recently saw Dr. 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 She is being evaluated for thyroidectomy after work-up for thyroid nodules.  Outpatient Encounter Medications as of 06/11/2019  Medication Sig  . acetaminophen (TYLENOL) 500 MG tablet Take 1,000 mg by mouth 2 (two) times daily as needed (for pain).  Marland Kitchen albuterol (PROAIR HFA) 108 (90 Base) MCG/ACT inhaler Inhale 2 puffs into the lungs every 6 (six) hours as needed for wheezing or shortness of breath.  . Azelastine-Fluticasone (DYMISTA) 137-50 MCG/ACT SUSP Place 1-2 sprays into both nostrils daily as needed (for allergies).  . blood  glucose meter kit and supplies KIT Dispense based on patient and insurance preference. Use up to four  times daily as directed. (FOR ICD-9 250.00, 250.01). For QAC - HS accuchecks.  . furosemide (LASIX) 40 MG tablet Take 40 mg by mouth 2 (two) times daily.  Marland Kitchen gabapentin (NEURONTIN) 300 MG capsule Take 1 capsule (300 mg total) by mouth 3 (three) times daily.  Marland Kitchen glimepiride (AMARYL) 2 MG tablet Take 1 tablet (2 mg total) by mouth daily with breakfast.  . glucose blood (FREESTYLE LITE) test strip For glucose testing every before meals at bedtime. Diagnosis E 11.65  Can substitute to any accepted brand  . Insulin Lispro Prot & Lispro (HUMALOG MIX 75/25 KWIKPEN) (75-25) 100 UNIT/ML Kwikpen Inject 80 Units into the skin See admin instructions. Inject 50 units into the skin before breakfast "and a second dose at bedtime 30 units  . metolazone (ZAROXOLYN) 2.5 MG tablet Take 2.5 mg by mouth three times a week, take 30 minutes before taking lasix  . metoprolol (TOPROL-XL) 200 MG 24 hr tablet Take 200 mg by mouth daily.  . montelukast (SINGULAIR) 10 MG tablet Take 10 mg by mouth at bedtime.   Marland Kitchen omeprazole (PRILOSEC) 20 MG capsule Take 1 capsule (20 mg total) by mouth daily.  . pravastatin (PRAVACHOL) 40 MG tablet Take 1 tablet (40 mg total) by mouth every evening.  . predniSONE (DELTASONE) 20 MG tablet Take 1 tablet (20 mg total) by mouth daily with breakfast.  . sertraline (ZOLOFT) 50 MG tablet Take 1 tablet (50 mg total) by mouth every evening. (Patient taking differently: Take 100 mg by mouth as needed. )  . [DISCONTINUED] FLUoxetine (PROZAC) 20 MG tablet TAKE 1 TABLET BY MOUTH EVERY DAY  . mycophenolate (CELLCEPT) 500 MG tablet Take 1 tablet (500 mg total) by mouth 2 (two) times daily. (Patient not taking: Reported on 06/11/2019)  . sulfamethoxazole-trimethoprim (BACTRIM DS) 800-160 MG tablet Take 1 tablet by mouth 3 (three) times a week. (Patient not taking: Reported on 06/11/2019)  . VITAMIN D PO Take 1 tablet by mouth daily.   No facility-administered encounter medications on file as of 06/11/2019.    Physical Exam: Blood pressure 126/80, pulse 76, temperature 98 F (36.7 C), temperature source Temporal, SpO2 93 %. Gen:      No acute distress, obese HEENT:  EOMI, sclera anicteric Neck:     No masses; no thyromegaly Lungs:    Clear to auscultation bilaterally; normal respiratory effort CV:         Regular rate and rhythm; no murmurs Abd:      + bowel sounds; soft, non-tender; no palpable masses, no distension Ext:    No edema; adequate peripheral perfusion Skin:      Warm and dry; no rash Neuro: alert and oriented x 3 Psych: normal mood and affect  Data Reviewed: Imaging: High-resolution CT 02/11/2019-patchy areas of groundglass attenuation, septal thickening bilaterally with no basal gradient.  No traction bronchiectasis or honeycombing.  Moderate air trapping.  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  Labs: Work-up at [facility] 2013-rheumatoid factor 52, hypersensitivity panel positive for Aspergillus fumigatus, aureobacterium pullulans Negative aldolase, ANA, ENA, SCL 70, Smith, CCP, RNP, SSA  Repeat CTD serologies/16/20-ANA, myositis panel, SCL 70, Ro, La, hypersensitivity panel, ANCA-negative Rheumatoid factor-25  Bronchoscopy 04/02/2019-cell count 450, 92% lymphs Microbiology -negative Bronchial biopsies-bronchial mucosa with no significant findings  Cardiac: Echocardiogram 12/24/2018 LVEF 60-65%, mild aortic wall stenosis.  Normal RV systolic function.  PA systolic pressure 44  Outside records from Dr.  Manos High-res CT chest 02/23/2018-hazy groundglass.  More pronounced on expiration images.  Worsened since 2014.  Findings suggest chronic interstitial pneumonitis consistent with history of hypersensitivity pneumonitis.  PFTs 01/16/2018 FVC 1.76 [60%], FEV1 1.16 (54%), F/F 66, TLC 4.52 (77%), DLCO 27.4 [21%] Poor quality spirometry.  Patient unable to do correct DLCO hence test is uninterpretable  Echo 06/21/2017-LVEF normal, normal RV size and function.  Mild TR.  Sleep study  07/27/2017-no significant sleep apnea although very loud snoring and severe desaturation.  Desaturations responsive to 2 L oxygen.  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  Differential is CTD associated NSIP fibrosis versus hypersensitivity pneumonitis.  Noted to have elevated RA but told her she does not have rheumatoid arthritis by rheumatologist.  She recently moved from [facility] in July 2020 and is living in a new house with no apparent ongoing exposures  Clinical picture is complicated by recent [id number] pneumonia which may have exacerbated her interstitial lung disease. Continues to have significant dyspnea.  BNP is normal, no evidence of pulmonary hypertension on echocardiogram.  She is not a candidate for surgical lung biopsy given obesity, severely reduced diffusion capacity Underwent bronchoscopy on 12/16 Results discussed at multidisciplinary conference and felt this is hypersensitivity pneumonitis with moderate certainty given CT findings and lymphocytosis on BAL  We ordered CellCept but patient is reluctant to take due to low blood counts (thrombocytopenia) and possible side effect of immune suppression.  Given these concerns she is not a candidate for other immunosuppressive agent such as azathioprine as well  We will try to get her slowly off prednisone.  Hopefully as she is recovering from COVID-19 and with no presumed ongoing exposures in her new home she will remain exacerbation free Reduce prednisone to 10 mg/day for 2 weeks and then 5 mg/day for 2 weeks and then off Continue Bactrim for prophylaxis  Lower extremity edema, diastolic heart failure Currently on Lasix and Zaroxolyn.  She is requesting to come down on the diuretics as it is making her urinate frequently We will reduce Lasix to 20 mg a day  Clearance  for total thyroidectomy  Being evaluated for thyroid nodules with concern for malignancy and needs total thyroidectomy She is at moderate to high increased risk given respiratory failure, interstitial lung disease but no absolute contraindications to surgery.    Overall, I recommend proceeding with the surgery if the risk for respiratory complications are outweighed by the potential benefits. This will need to be discussed between the patient and surgeon.  To reduce risks of respiratory complications, I recommend: --Pre- and post-operative incentive spirometry performed frequently while awake --Avoiding use of pancuronium during anesthesia. -DVT prophylaxis, early mobilization, adequate pain control  I have discussed the risk factors and recommendations above with the patient.  This appointment required 45 minutes of patient care (this includes precharting, chart review, review of results, face-to-face care, etc.).  Plan/Recommendations: - Taper prednisone to 10 mg/day for 2 weeks and then 5 mg/day for 2 weeks and then stop - Reduce Lasix to 20 mg/day - Continue Bactrim 3 times a week for prophylaxis  Marshell Garfinkel MD [facility] 06/11/2019, 2:25 PM  CC: Kerin Perna, NP

## 2019-06-11 NOTE — Progress Notes (Signed)
Subjective:  Patient presents today to [facility] to see pharmacy team for Cellcept counseling and management.  Pertinent past medical history includes interstitial lung disease, history of positive PPD treated with isoniazid for 1 year, hypertension, CHF, DM2, morbid obesity, history of thrombocytopenia, history of chronic myeloid leukemia. Prior therapy includes chronic prednisone.  She has not started Cellcept due to concerns over side effects.  Objective: Allergies  Allergen Reactions  . Other Shortness Of Breath and Other (See Comments)    Newspaper ink =  new chest pain, also  . Iodine Other (See Comments)    "Was a long time ago" (Reaction??)  . Merbromin Other (See Comments)    Mercurochrome- "Was a long time ago" (Reaction??)    Outpatient Encounter Medications as of 06/11/2019  Medication Sig  . acetaminophen (TYLENOL) 500 MG tablet Take 1,000 mg by mouth 2 (two) times daily as needed (for pain).  Marland Kitchen albuterol (PROAIR HFA) 108 (90 Base) MCG/ACT inhaler Inhale 2 puffs into the lungs every 6 (six) hours as needed for wheezing or shortness of breath.  . Azelastine-Fluticasone (DYMISTA) 137-50 MCG/ACT SUSP Place 1-2 sprays into both nostrils daily as needed (for allergies).  . blood glucose meter kit and supplies KIT Dispense based on patient and insurance preference. Use up to four times daily as directed. (FOR ICD-9 250.00, 250.01). For QAC - HS accuchecks.  Marland Kitchen FLUoxetine (PROZAC) 20 MG tablet TAKE 1 TABLET BY MOUTH EVERY DAY  . furosemide (LASIX) 40 MG tablet Take 40 mg by mouth 2 (two) times daily.  Marland Kitchen gabapentin (NEURONTIN) 300 MG capsule Take 1 capsule (300 mg total) by mouth 3 (three) times daily.  Marland Kitchen glimepiride (AMARYL) 2 MG tablet Take 1 tablet (2 mg total) by mouth daily with breakfast.  . glucose blood (FREESTYLE LITE) test strip For glucose testing every before meals at bedtime. Diagnosis E 11.65  Can substitute to any accepted brand  . Insulin Lispro Prot & Lispro  (HUMALOG MIX 75/25 KWIKPEN) (75-25) 100 UNIT/ML Kwikpen Inject 80 Units into the skin See admin instructions. Inject 50 units into the skin before breakfast "and a second dose at bedtime 30 units  . metolazone (ZAROXOLYN) 2.5 MG tablet Take 2.5 mg by mouth three times a week, take 30 minutes before taking lasix  . metoprolol (TOPROL-XL) 200 MG 24 hr tablet Take 200 mg by mouth daily.  . mycophenolate (CELLCEPT) 500 MG tablet Take 1 tablet (500 mg total) by mouth 2 (two) times daily.  Marland Kitchen omeprazole (PRILOSEC) 20 MG capsule Take 1 capsule (20 mg total) by mouth daily. (Patient not taking: Reported on 05/15/2019)  . pravastatin (PRAVACHOL) 40 MG tablet Take 1 tablet (40 mg total) by mouth every evening.  . predniSONE (DELTASONE) 20 MG tablet Take 1 tablet (20 mg total) by mouth daily with breakfast.  . sertraline (ZOLOFT) 50 MG tablet Take 1 tablet (50 mg total) by mouth every evening.  . sulfamethoxazole-trimethoprim (BACTRIM DS) 800-160 MG tablet Take 1 tablet by mouth 3 (three) times a week.  Marland Kitchen VITAMIN D PO Take 1 tablet by mouth daily.   No facility-administered encounter medications on file as of 06/11/2019.     Immunization History  Administered Date(s) Administered  . Fluad Quad(high Dose 65+) 01/23/2019     PFT's No results found for: FEV1, FVC, FEV1FVC, TLC, DLCO   Chest X-ray  CMP     Component Value Date/Time   NA 143 04/23/2019 1157   NA 140 04/04/2019 1219   K 4.8  04/23/2019 1157   CL 107 04/23/2019 1157   CO2 28 04/23/2019 1157   GLUCOSE 109 (H) 04/23/2019 1157   BUN 11 04/23/2019 1157   BUN 15 04/04/2019 1219   CREATININE 0.78 04/23/2019 1157   CALCIUM 9.2 04/23/2019 1157   PROT 6.5 04/23/2019 1157   PROT 6.7 04/04/2019 1219   ALBUMIN 3.9 04/23/2019 1157   ALBUMIN 4.2 04/04/2019 1219   AST 16 04/23/2019 1157   ALT 14 04/23/2019 1157   ALKPHOS 56 04/23/2019 1157   BILITOT 1.3 (H) 04/23/2019 1157   GFRNONAA >60 04/23/2019 1157   GFRAA >60 04/23/2019 1157      CBC    Component Value Date/Time   WBC 4.5 04/23/2019 1157   WBC 8.7 03/21/2019 1137   RBC 3.96 04/23/2019 1157   HGB 12.4 04/23/2019 1157   HGB 12.5 04/04/2019 1219   HCT 38.6 04/23/2019 1157   HCT 38.2 04/04/2019 1219   PLT 57 (L) 04/23/2019 1157   PLT 78 (LL) 04/04/2019 1219   MCV 97.5 04/23/2019 1157   MCV 97 04/04/2019 1219   MCH 31.3 04/23/2019 1157   MCHC 32.1 04/23/2019 1157   RDW 19.9 (H) 04/23/2019 1157   RDW 17.6 (H) 04/04/2019 1219   LYMPHSABS 2.8 04/23/2019 1157   LYMPHSABS 1.6 01/09/2019 1518   MONOABS 0.5 04/23/2019 1157   EOSABS 0.0 04/23/2019 1157   EOSABS 0.0 01/09/2019 1518   BASOSABS 0.0 04/23/2019 1157   BASOSABS 0.0 01/09/2019 1518     LFT's Hepatic Function Latest Ref Rng & Units 04/23/2019 04/04/2019 03/03/2019  Total Protein 6.5 - 8.1 g/dL 6.5 6.7 7.0  Albumin 3.5 - 5.0 g/dL 3.9 4.2 4.3  AST 15 - 41 U/L _0 ALT 0 - 44 U/L _1 Alk Phosphatase 38 - 126 U/L 56 69 80  Total Bilirubin 0.3 - 1.2 mg/dL 1.3(H) 0.8 1.2  Bilirubin, Direct 0.0 - 0.2 mg/dL - - -     Assessment and Plan  1. Cellcept Medication Management  Patient was counseled on the purpose, proper use, and adverse effects of mycophenolate including risk of infection, new or reactivation of viral infections, nausea, and headaches.  Discussed warning of increased risk of development of lymphoma and other malignancies, particularly of the skin.  Discussed risk of neutropenia and discussed importance of regular labs to monitor blood counts, liver function, and kidney function.  Reviewed risk of congenital malformations, and the importance of contraception while on this medication.  Counseled patient on importance of taking PCP prophylaxis while on mycophenolate.  Provided patient with educational materials and answered all questions. Patient does not want to start Cellcept at this time due to concerns of immunosuppression and history of thrombocytopenia.  2. Medication  Reconciliation  A drug regimen assessment was performed, including review of allergies, interactions, disease-state management, dosing and immunization history. Medications were reviewed with the patient, including name, instructions, indication, goals of therapy, potential side effects, importance of adherence, and safe use.  Thank you for allowing pharmacy to participate in this patient's care.   Mariella Saa, PharmD, [place], [place] Clinical Specialty Pharmacist [phone]  06/11/2019 2:56 PM

## 2019-06-11 NOTE — Patient Instructions (Signed)
We will hold off on the CellCept since you are having low platelets and out of concern for causing low blood counts Continue the Bactrim 3 times a week We will start tapering the prednisone slowly.  Reduce to 10 mg of prednisone for the next 2 weeks and then 5 mg for the next 2 weeks and then stop Reduce Lasix to 20 mg a day  Follow-up in 1 to 2 months.

## 2019-06-13 ENCOUNTER — Ambulatory Visit: Payer: Medicare Other

## 2019-06-19 ENCOUNTER — Telehealth: Payer: Self-pay | Admitting: Pulmonary Disease

## 2019-06-19 NOTE — Telephone Encounter (Signed)
If I did get them they were faxed and sent to be scanned. So they may be in transition. I do not have any papers.

## 2019-06-19 NOTE — Telephone Encounter (Signed)
I called Vaughan Basta but there was no answer. I left voicemail on machine to have her call back.   Danae Chen do you recall Dr. Vaughan Browner getting these medical clearance forms?

## 2019-06-20 NOTE — Telephone Encounter (Signed)
Checked the pt's chart, this documents have not been scanned in yet.

## 2019-06-22 ENCOUNTER — Other Ambulatory Visit (INDEPENDENT_AMBULATORY_CARE_PROVIDER_SITE_OTHER): Payer: Self-pay | Admitting: Primary Care

## 2019-06-23 NOTE — Telephone Encounter (Signed)
[facility] records have been scanned into chart but there arent any notes on the records indicating a clearance for surgery. Pt last seen on 06/11/19 by Dr. Vaughan Browner. Surgery is for a thyroidectomy that has yet to be scheduled.   Dr. Vaughan Browner, please advise on pt's surgery clearance.

## 2019-06-24 NOTE — Telephone Encounter (Signed)
We did not get any forms for surgery clearance but I have addressed this in my last clinic note.  Please send them a copy of the clinic note from 2/24

## 2019-06-24 NOTE — Telephone Encounter (Signed)
OV Note has been faxed per Dr. Vaughan Browner.

## 2019-07-04 ENCOUNTER — Telehealth: Payer: Self-pay | Admitting: *Deleted

## 2019-07-04 NOTE — Telephone Encounter (Signed)
Dr. Irene Limbo received request for surgical clearance for Thyroidectomy to be done by Dr.Gerkin at [facility].  The patient had appointment with  Dr. Irene Limbo in January 2021. At that time, no records of past treatment were available. Patient stated her oncologist office was merged with another and that the oncologist later left the practice. She did not remember the MD name.  Dr. Irene Limbo asked that patient records be obtained from previous caregiver following appt 04/23/19 . Patient and [facility] HIM attempted to procure records at that time without success.  Dr. Irene Limbo would like to try again before providing surgical clearance and/or repeating any testing.   The new name for [place] is:  [facility] [address] [place], [place] Phone: [phone] Further info on website: [place] for [facility] Phone: [phone] Fax: [phone]  The information above has been sent to [facility] with request for them to obtain records if possible. Patient was also contacted, updated and she states she will contact her PCP in [facility] to see if they might have records.  Nance Pear, LPN w/Dr. Harlow Asa to update on pending surgical clearance and current search for patient's past records and test results. She will inform Dr. Harlow Asa.

## 2019-07-15 LAB — HM DIABETES EYE EXAM

## 2019-07-24 ENCOUNTER — Ambulatory Visit (HOSPITAL_COMMUNITY)
Admission: EM | Admit: 2019-07-24 | Discharge: 2019-07-24 | Disposition: A | Discharge status: Home / Self Care | Payer: Medicare Other | Attending: Family Medicine | Admitting: Family Medicine

## 2019-07-24 ENCOUNTER — Encounter (HOSPITAL_COMMUNITY): Payer: Self-pay

## 2019-07-24 ENCOUNTER — Other Ambulatory Visit: Payer: Self-pay

## 2019-07-24 DIAGNOSIS — J302 Other seasonal allergic rhinitis: Secondary | ICD-10-CM

## 2019-07-24 DIAGNOSIS — M545 Low back pain, unspecified: Secondary | ICD-10-CM

## 2019-07-24 DIAGNOSIS — M7989 Other specified soft tissue disorders: Secondary | ICD-10-CM

## 2019-07-24 DIAGNOSIS — M79661 Pain in right lower leg: Secondary | ICD-10-CM

## 2019-07-24 DIAGNOSIS — L03115 Cellulitis of right lower limb: Secondary | ICD-10-CM

## 2019-07-24 MED ORDER — TIZANIDINE HCL 4 MG PO TABS: 4.0000 mg | ORAL_TABLET | Freq: Four times a day (QID) | ORAL | 0 refills | Status: DC | PRN

## 2019-07-24 MED ORDER — MELOXICAM 7.5 MG PO TABS: 7.5000 mg | ORAL_TABLET | Freq: Every day | ORAL | 0 refills | Status: DC

## 2019-07-24 MED ORDER — ALBUTEROL SULFATE HFA 108 (90 BASE) MCG/ACT IN AERS: 1.0000 | INHALATION_SPRAY | Freq: Four times a day (QID) | RESPIRATORY_TRACT | 0 refills | Status: DC | PRN

## 2019-07-24 MED ORDER — OLOPATADINE HCL 0.1 % OP SOLN: 1.0000 [drp] | Freq: Two times a day (BID) | OPHTHALMIC | 0 refills | Status: AC

## 2019-07-24 MED ORDER — CEPHALEXIN 500 MG PO CAPS: 500.0000 mg | ORAL_CAPSULE | Freq: Four times a day (QID) | ORAL | 0 refills | Status: AC

## 2019-07-24 NOTE — ED Triage Notes (Signed)
Pt presents with right leg pain and swelling X 1 week.  Pt also presents with lower back pain X 1 week.

## 2019-07-24 NOTE — Discharge Instructions (Signed)
Leg Pain and Swelling I believe the increased pain swelling and redness to leg is likely from cellulitis-begin Keflex 4 times daily for the next week. Please elevate legs when at home and resting to help with further swelling May use Ace wrap to help with swelling Continue Lasix as prescribed If you develop increased redness swelling, pain, fevers, dizzy, lightheadedness please follow-up here or emergency room  Back pain Begin Mobic daily with food May use tizanidine-muscle relaxer-as needed when at home or bedtime, may cause drowsiness, do not drive or work after taking May alternate ice and heat to back  Allergies Continue Xyzal and Singulair Use olopatadine eyedrops twice daily into the eyes to help with itching Albuterol inhaler as needed for any shortness of breath, chest tightness or wheezing Please follow-up with pulmonologist for further breathing concerns If you develop increased shortness of breath or difficulty breathing please follow-up in the emergency room

## 2019-07-24 NOTE — ED Provider Notes (Signed)
[place]    CSN: [id number] Arrival date & time: 07/24/19  1803      History   Chief Complaint Chief Complaint  Patient presents with  . Leg Pain  . Back Pain    HPI Kathy Irwin is a 69 y.o. female history of hypertension, diastolic CHF, interstitial lung disease, CML, DM type II, presenting today for evaluation of multiple complaints including right leg pain and swelling, low back pain and allergy symptoms.  Patient notes that she has history of bilateral leg swelling which she takes Lasix for.  Notes that swelling has improved of recently, but notes that she has noticed that her lower legs have become discolored and is starting to develop some sores.  She reports she has noticed some pus draining from these areas.  Has pain largely anteriorly to legs.  Denies less symptoms posteriorly and calves.  Reports she has had prior ultrasounds in the past which have been negative for DVT.  Denies any increased swelling of recently.  Denies history of similar.  Similar symptoms on left side, but more prominent on right.  She also reports low back pain for approximately 1 week.  Notes that this pain is bilateral in radiates into gluteal areas.  Denies numbness or tingling.  Denies leg weakness.  Denies saddle anesthesia.  Denies loss of control of bowel or bladder.  Denies any injury fall or increase in heavy lifting.  Denies any history of similar.  She has been using Tylenol without relief.  She also is concerned about her allergies.  She has been using Xyzal and Singulair.  Reports a lot of eye itching.  She does report chronic issues with her breathing and notes that she has scarring in her lungs.  She is concerned if she may need an inhaler similar to what is used in COPD.  Denies history of COPD or tobacco use.  HPI  Past Medical History:  Diagnosis Date  . Acute respiratory failure with hypoxia ([place]) 12/2018  . Diabetes mellitus without complication ([place])   .  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 Hypertension   . ILD (interstitial lung disease) ([place]) 12/24/2018    Patient Active Problem List   Diagnosis Date Noted  . Atherosclerosis of aorta ([place]) 01/23/2019  . Interstitial lung disease ([place]) 12/24/2018  . Acute diastolic CHF (congestive heart failure) ([place]) 12/24/2018  . Obesity, Class III, BMI 40-49.9 (morbid obesity) ([place]) 12/24/2018  . Pneumonia due to COVID-19 virus 11/15/2018  . Acute respiratory failure with hypoxia ([place]) 12/19/2017  . 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 Thrombocytopenia ([place]) 12/19/2017  . DM (diabetes mellitus) ([place]) 12/17/2017  . HTN (hypertension) 12/17/2017  . Essential hypertension 11/05/2017  . 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[place]) 11/05/2017    Past Surgical History:  Procedure Laterality Date  . ABDOMINAL HYSTERECTOMY    . VIDEO BRONCHOSCOPY Bilateral 04/02/2019   Procedure: VIDEO BRONCHOSCOPY WITH FLUORO;  Surgeon: Marshell Garfinkel, MD;  Location: [facility];  Service: Cardiopulmonary;  Laterality: Bilateral;    OB History   No obstetric history on file.      Home Medications    Prior to Admission medications   Medication Sig Start Date End Date Taking? Authorizing Provider  acetaminophen (TYLENOL) 500 MG tablet Take 1,000 mg by mouth 2 (two) times daily as needed (for pain).    [provider]  albuterol (VENTOLIN HFA) 108 (90 Base) MCG/ACT inhaler Inhale 1-2 puffs into the lungs every 6 (six) hours as needed for wheezing or shortness  of breath. 07/24/19   Elky Funches C, PA-C  Azelastine-Fluticasone (DYMISTA) 137-50 MCG/ACT SUSP Place 1-2 sprays into both nostrils daily as needed (for allergies). 05/15/19   Kerin Perna, NP  blood glucose meter kit and supplies KIT Dispense based on patient and insurance preference. Use up to four times daily as directed. (FOR ICD-9 250.00, 250.01). For QAC - HS accuchecks. 11/22/18   Thurnell Lose, MD  cephALEXin (KEFLEX) 500 MG  capsule Take 1 capsule (500 mg total) by mouth 4 (four) times daily for 7 days. 07/24/19 07/31/19  Chaela Branscum C, PA-C  furosemide (LASIX) 40 MG tablet Take 40 mg by mouth 2 (two) times daily.    [provider]  gabapentin (NEURONTIN) 300 MG capsule Take 1 capsule (300 mg total) by mouth 3 (three) times daily. 05/15/19   Kerin Perna, NP  glimepiride (AMARYL) 2 MG tablet Take 1 tablet (2 mg total) by mouth daily with breakfast. 05/15/19   Kerin Perna, NP  glucose blood (FREESTYLE LITE) test strip For glucose testing every before meals at bedtime. Diagnosis E 11.65  Can substitute to any accepted brand 11/22/18   Thurnell Lose, MD  Insulin Lispro Prot & Lispro (HUMALOG MIX 75/25 KWIKPEN) (75-25) 100 UNIT/ML Kwikpen Inject 80 Units into the skin See admin instructions. Inject 50 units into the skin before breakfast "and a second dose at bedtime 30 units 05/15/19   Kerin Perna, NP  meloxicam (MOBIC) 7.5 MG tablet Take 1 tablet (7.5 mg total) by mouth daily for 10 days. Take in the morning, with food. 07/24/19 08/03/19  Johndaniel Catlin C, PA-C  metolazone (ZAROXOLYN) 2.5 MG tablet Take 2.5 mg by mouth three times a week, take 30 minutes before taking lasix 05/06/19   Troy Sine, MD  metoprolol (TOPROL-XL) 200 MG 24 hr tablet Take 200 mg by mouth daily.    [provider]  montelukast (SINGULAIR) 10 MG tablet Take 10 mg by mouth at bedtime.  05/31/19   [provider]  olopatadine (PATANOL) 0.1 % ophthalmic solution Place 1 drop into both eyes 2 (two) times daily. 07/24/19   Kajsa Butrum C, PA-C  omeprazole (PRILOSEC) 20 MG capsule Take 1 capsule (20 mg total) by mouth daily. 12/26/18   Hongalgi, Lenis Dickinson, MD  pravastatin (PRAVACHOL) 10 MG tablet TAKE 1 TABLET BY MOUTH EVERY DAY 06/23/19   Kerin Perna, NP  predniSONE (DELTASONE) 20 MG tablet Take 1 tablet (20 mg total) by mouth daily with breakfast. 05/02/19   Mannam, Hart Robinsons, MD  sertraline (ZOLOFT)  50 MG tablet Take 1 tablet (50 mg total) by mouth every evening. Patient taking differently: Take 100 mg by mouth as needed.  01/09/19   Kerin Perna, NP  tiZANidine (ZANAFLEX) 4 MG tablet Take 1 tablet (4 mg total) by mouth every 6 (six) hours as needed for muscle spasms (back pain). 07/24/19   Musa Rewerts C, PA-C  VITAMIN D PO Take 1 tablet by mouth daily.    [provider]    Family History Family History  Problem Relation Age of Onset  . Diabetes Other   . Hypertension Other     Social History Social History   Tobacco Use  . Smoking status: Never Smoker  . Smokeless tobacco: Never Used  Substance Use Topics  . Alcohol use: Never  . Drug use: Never     Allergies   Other, Iodine, and Merbromin   Review of Systems Review of Systems  Constitutional: Negative for activity change, appetite change, chills, fatigue and fever.  HENT: Positive for rhinorrhea. Negative for congestion, ear pain, sinus pressure, sore throat and trouble swallowing.   Eyes: Positive for itching. Negative for discharge and redness.  Respiratory: Positive for shortness of breath. Negative for cough and chest tightness.   Cardiovascular: Positive for leg swelling. Negative for chest pain.  Gastrointestinal: Negative for abdominal pain, diarrhea, nausea and vomiting.  Genitourinary: Negative for decreased urine volume and difficulty urinating.  Musculoskeletal: Positive for back pain and myalgias.  Skin: Negative for rash.  Neurological: Negative for dizziness, light-headedness and headaches.     Physical Exam Triage Vital Signs ED Triage Vitals  Enc Vitals Group     BP 07/24/19 1821 (!) 142/76     Pulse Rate 07/24/19 1821 78     Resp 07/24/19 1821 20     Temp 07/24/19 1821 98.1 F (36.7 C)     Temp Source 07/24/19 1821 Oral     SpO2 --      Weight --      Height --      Head Circumference --      Peak Flow --      Pain Score 07/24/19 1823 9     Pain Loc --      Pain  Edu? --      Excl. in [place]? --    No data found.  Updated Vital Signs BP (!) 142/76 (BP Location: Right Arm)   Pulse 78   Temp 98.1 F (36.7 C) (Oral)   Resp 20   Visual Acuity Right Eye Distance:   Left Eye Distance:   Bilateral Distance:    Right Eye Near:   Left Eye Near:    Bilateral Near:     Physical Exam Vitals and nursing note reviewed.  Constitutional:      Appearance: She is well-developed.     Comments: No acute distress  HENT:     Head: Normocephalic and atraumatic.     Nose: Nose normal.  Eyes:     Conjunctiva/sclera: Conjunctivae normal.  Cardiovascular:     Rate and Rhythm: Normal rate.  Pulmonary:     Effort: Pulmonary effort is normal. No respiratory distress.     Comments: Breathing comfortably at rest, inconsistent faint end expiratory wheezing noted Abdominal:     General: There is no distension.  Musculoskeletal:        General: Normal range of motion.     Cervical back: Neck supple.     Comments: Bilateral lower legs with hyperpigmentation and faint erythema and warmth, right lower anterior leg with slight areas of skin breakdown, 1+ edema noted bilaterally, no calf tenderness to palpation bilaterally; tender to palpation anteriorly  Small 0.5 cm fluid-filled blister noted anteriorly, appears clear, no pus noted  Dorsalis pedis 2+ bilaterally  Back: Nontender to palpation of lumbar spine midline, mild tenderness to palpation of lateral lumbar musculature bilaterally extending into upper gluteal areas  Skin:    General: Skin is warm and dry.  Neurological:     Mental Status: She is alert and oriented to person, place, and time.      UC Treatments / Results  Labs (all labs ordered are listed, but only abnormal results are displayed) Labs Reviewed - No data to display  EKG   Radiology No results found.  Procedures Procedures (including critical care time)  Medications Ordered in UC Medications - No data to display  Initial  Impression / Assessment  and Plan / UC Course  I have reviewed the triage vital signs and the nursing notes.  Pertinent labs & imaging results that were available during my care of the patient were reviewed by me and considered in my medical decision making (see chart for details).     1.  Leg swelling and pain concerning for secondary cellulitis from chronic swelling.  Initiating on Keflex, recommending elevation and continuing Lasix as prescribed.  Ace wrap to help with compression.  Close monitoring, advised to follow-up if symptoms progressing or worsening. VSS without fever, tachycardia.  2.  Low back pain bilateral-no neuro deficits, no red flags for cauda equina, no injury, do not suspect acute bony abnormality.  Most likely muscular straining.  Will recommend continued anti-inflammatories, will provide Mobic to use as alternative to Tylenol, tizanidine for muscle relaxer.  3.  Allergies-we will add in olopatadine to help with ocular symptoms, does have some mild faint wheezing noted, will provide albuterol.  Discussed with patient I did not feel her symptoms warranted initiating on a daily steroid maintenance inhaler and this should be done by PCP or pulmonology if she has persistent issues.  Recommended to follow-up for any persistent/chronic breathing concerns.  Breathing comfortably at rest today.  Discussed strict return precautions. Patient verbalized understanding and is agreeable with plan.  Final Clinical Impressions(s) / UC Diagnoses   Final diagnoses:  Seasonal allergic rhinitis, unspecified trigger  Pain and swelling of right lower leg  Cellulitis of right lower extremity  Acute bilateral low back pain without sciatica     Discharge Instructions     Leg Pain and Swelling I believe the increased pain swelling and redness to leg is likely from cellulitis-begin Keflex 4 times daily for the next week. Please elevate legs when at home and resting to help with further  swelling May use Ace wrap to help with swelling Continue Lasix as prescribed If you develop increased redness swelling, pain, fevers, dizzy, lightheadedness please follow-up here or emergency room  Back pain Begin Mobic daily with food May use tizanidine-muscle relaxer-as needed when at home or bedtime, may cause drowsiness, do not drive or work after taking May alternate ice and heat to back  Allergies Continue Xyzal and Singulair Use olopatadine eyedrops twice daily into the eyes to help with itching Albuterol inhaler as needed for any shortness of breath, chest tightness or wheezing Please follow-up with pulmonologist for further breathing concerns If you develop increased shortness of breath or difficulty breathing please follow-up in the emergency room   ED Prescriptions    Medication Sig Dispense Auth. Provider   cephALEXin (KEFLEX) 500 MG capsule Take 1 capsule (500 mg total) by mouth 4 (four) times daily for 7 days. 28 capsule Rocklyn Mayberry C, PA-C   albuterol (VENTOLIN HFA) 108 (90 Base) MCG/ACT inhaler Inhale 1-2 puffs into the lungs every 6 (six) hours as needed for wheezing or shortness of breath. 8 g Scarleth Brame C, PA-C   olopatadine (PATANOL) 0.1 % ophthalmic solution Place 1 drop into both eyes 2 (two) times daily. 5 mL [place] Bohac C, PA-C   meloxicam (MOBIC) 7.5 MG tablet Take 1 tablet (7.5 mg total) by mouth daily for 10 days. Take in the morning, with food. 10 tablet Jolin Benavides C, PA-C   tiZANidine (ZANAFLEX) 4 MG tablet Take 1 tablet (4 mg total) by mouth every 6 (six) hours as needed for muscle spasms (back pain). 30 tablet Quintarius Ferns, [place] C, PA-C     PDMP not  reviewed this encounter.   Janith Lima, [place] 07/24/19 1901

## 2019-08-01 ENCOUNTER — Inpatient Hospital Stay (HOSPITAL_COMMUNITY)
Admission: EM | Admit: 2019-08-01 | Discharge: 2019-08-12 | DRG: 291 | Disposition: A | Discharge status: Home / Self Care | Payer: Medicare Other | Attending: Internal Medicine | Admitting: Internal Medicine

## 2019-08-01 ENCOUNTER — Emergency Department (HOSPITAL_COMMUNITY): Payer: Medicare Other

## 2019-08-01 ENCOUNTER — Telehealth: Payer: Self-pay | Admitting: Pulmonary Disease

## 2019-08-01 ENCOUNTER — Other Ambulatory Visit: Payer: Self-pay

## 2019-08-01 ENCOUNTER — Encounter (HOSPITAL_COMMUNITY): Payer: Self-pay | Admitting: Emergency Medicine

## 2019-08-01 DIAGNOSIS — R251 Tremor, unspecified: Secondary | ICD-10-CM | POA: Diagnosis present

## 2019-08-01 DIAGNOSIS — Z90710 Acquired absence of both cervix and uterus: Secondary | ICD-10-CM

## 2019-08-01 DIAGNOSIS — J679 Hypersensitivity pneumonitis due to unspecified organic dust: Secondary | ICD-10-CM | POA: Diagnosis present

## 2019-08-01 DIAGNOSIS — D696 Thrombocytopenia, unspecified: Secondary | ICD-10-CM | POA: Diagnosis present

## 2019-08-01 DIAGNOSIS — Z794 Long term (current) use of insulin: Secondary | ICD-10-CM

## 2019-08-01 DIAGNOSIS — J9611 Chronic respiratory failure with hypoxia: Secondary | ICD-10-CM | POA: Diagnosis present

## 2019-08-01 DIAGNOSIS — Z888 Allergy status to other drugs, medicaments and biological substances status: Secondary | ICD-10-CM

## 2019-08-01 DIAGNOSIS — I110 Hypertensive heart disease with heart failure: Secondary | ICD-10-CM | POA: Diagnosis not present

## 2019-08-01 DIAGNOSIS — Z20822 Contact with and (suspected) exposure to covid-19: Secondary | ICD-10-CM | POA: Diagnosis present

## 2019-08-01 DIAGNOSIS — I5033 Acute on chronic diastolic (congestive) heart failure: Secondary | ICD-10-CM | POA: Diagnosis present

## 2019-08-01 DIAGNOSIS — J9621 Acute and chronic respiratory failure with hypoxia: Secondary | ICD-10-CM | POA: Diagnosis present

## 2019-08-01 DIAGNOSIS — Z8249 Family history of ischemic heart disease and other diseases of the circulatory system: Secondary | ICD-10-CM

## 2019-08-01 DIAGNOSIS — R05 Cough: Secondary | ICD-10-CM

## 2019-08-01 DIAGNOSIS — K219 Gastro-esophageal reflux disease without esophagitis: Secondary | ICD-10-CM | POA: Diagnosis present

## 2019-08-01 DIAGNOSIS — F419 Anxiety disorder, unspecified: Secondary | ICD-10-CM | POA: Diagnosis present

## 2019-08-01 DIAGNOSIS — E119 Type 2 diabetes mellitus without complications: Secondary | ICD-10-CM | POA: Diagnosis present

## 2019-08-01 DIAGNOSIS — Z791 Long term (current) use of non-steroidal anti-inflammatories (NSAID): Secondary | ICD-10-CM

## 2019-08-01 DIAGNOSIS — E876 Hypokalemia: Secondary | ICD-10-CM | POA: Diagnosis not present

## 2019-08-01 DIAGNOSIS — J849 Interstitial pulmonary disease, unspecified: Secondary | ICD-10-CM | POA: Diagnosis present

## 2019-08-01 DIAGNOSIS — E785 Hyperlipidemia, unspecified: Secondary | ICD-10-CM | POA: Diagnosis present

## 2019-08-01 DIAGNOSIS — I5032 Chronic diastolic (congestive) heart failure: Secondary | ICD-10-CM | POA: Diagnosis present

## 2019-08-01 DIAGNOSIS — I10 Essential (primary) hypertension: Secondary | ICD-10-CM | POA: Diagnosis present

## 2019-08-01 DIAGNOSIS — Z833 Family history of diabetes mellitus: Secondary | ICD-10-CM

## 2019-08-01 DIAGNOSIS — Z6841 Body Mass Index (BMI) 40.0 and over, adult: Secondary | ICD-10-CM

## 2019-08-01 DIAGNOSIS — R0602 Shortness of breath: Secondary | ICD-10-CM | POA: Diagnosis not present

## 2019-08-01 DIAGNOSIS — R059 Cough, unspecified: Secondary | ICD-10-CM

## 2019-08-01 DIAGNOSIS — F329 Major depressive disorder, single episode, unspecified: Secondary | ICD-10-CM | POA: Diagnosis present

## 2019-08-01 DIAGNOSIS — Z79899 Other long term (current) drug therapy: Secondary | ICD-10-CM

## 2019-08-01 DIAGNOSIS — E118 Type 2 diabetes mellitus with unspecified complications: Secondary | ICD-10-CM

## 2019-08-01 DIAGNOSIS — R0902 Hypoxemia: Secondary | ICD-10-CM

## 2019-08-01 DIAGNOSIS — R682 Dry mouth, unspecified: Secondary | ICD-10-CM | POA: Diagnosis present

## 2019-08-01 LAB — BASIC METABOLIC PANEL
Anion gap: 9 (ref 5–15)
BUN: 7 mg/dL — ABNORMAL LOW (ref 8–23)
CO2: 26 mmol/L (ref 22–32)
Calcium: 9.9 mg/dL (ref 8.9–10.3)
Chloride: 104 mmol/L (ref 98–111)
Creatinine, Ser: 0.7 mg/dL (ref 0.44–1.00)
GFR calc Af Amer: >60 mL/min (ref >60)
GFR calc non Af Amer: >60 mL/min (ref >60)
Glucose, Bld: 153 mg/dL — ABNORMAL HIGH (ref 70–99)
Potassium: 4.5 mmol/L (ref 3.5–5.1)
Sodium: 139 mmol/L (ref 135–145)

## 2019-08-01 LAB — CBC
HCT: 46.2 % — ABNORMAL HIGH (ref 36.0–46.0)
Hemoglobin: 14.6 g/dL (ref 12.0–15.0)
MCH: 31.7 pg (ref 26.0–34.0)
MCHC: 31.6 g/dL (ref 30.0–36.0)
MCV: 100.4 fL — ABNORMAL HIGH (ref 80.0–100.0)
Platelets: 67 10*3/uL — ABNORMAL LOW (ref 150–400)
RBC: 4.6 MIL/uL (ref 3.87–5.11)
RDW: 21.2 % — ABNORMAL HIGH (ref 11.5–15.5)
WBC: 8.8 10*3/uL (ref 4.0–10.5)
nRBC: 2.2 % — ABNORMAL HIGH (ref 0.0–0.2)

## 2019-08-01 LAB — TROPONIN I (HIGH SENSITIVITY)
Troponin I (High Sensitivity): 3 ng/L (ref <18)
Troponin I (High Sensitivity): 3 ng/L (ref <18)

## 2019-08-01 MED ORDER — PREDNISONE 10 MG PO TABS: ORAL_TABLET | ORAL | 0 refills | Status: DC

## 2019-08-01 NOTE — Telephone Encounter (Signed)
Pt c/o increased SOB with any exertion, prod cough with thick white/clear mucus, runny nose, sinus congestion, b/l ear pain X 1 week.  States she is coughing so hard she urinates uncontrollably.    Pt went to UC on 4/8, was given Cephalexin 518m QID, Mobic, albuterol HFA (using q3-4 h).  Pt also using Dymista, Singulair.    Denies fever, loss of taste/smell, headache, CP, n/v/d.  Pt requesting additional recs.    Pharmacy: [facility] on [address]    Sending to APP of the day- please advise on recs.  Thanks!

## 2019-08-01 NOTE — Telephone Encounter (Signed)
Spoke with pt's daughter and she is currently checking into the hospital. She will be evaluated there because here oxygen was dropping too low. Will close encounter.

## 2019-08-01 NOTE — Telephone Encounter (Signed)
Will send in prednisone taper. Continue using Albuterol every 6 hours, singulair and Dymista. She can add claritin or zyrtec. Recommend mucinex 641m twice daily. If symptoms worsen Advise ED

## 2019-08-01 NOTE — ED Triage Notes (Signed)
Patient c/o shortness of breath and cough x 1 week. Chest pain when coughing and reports feeling congested.

## 2019-08-02 ENCOUNTER — Encounter (HOSPITAL_COMMUNITY): Payer: Self-pay | Admitting: Family Medicine

## 2019-08-02 ENCOUNTER — Other Ambulatory Visit: Payer: Self-pay

## 2019-08-02 DIAGNOSIS — R0602 Shortness of breath: Secondary | ICD-10-CM | POA: Diagnosis present

## 2019-08-02 DIAGNOSIS — F329 Major depressive disorder, single episode, unspecified: Secondary | ICD-10-CM | POA: Diagnosis present

## 2019-08-02 DIAGNOSIS — D696 Thrombocytopenia, unspecified: Secondary | ICD-10-CM | POA: Diagnosis present

## 2019-08-02 DIAGNOSIS — Z79899 Other long term (current) drug therapy: Secondary | ICD-10-CM | POA: Diagnosis not present

## 2019-08-02 DIAGNOSIS — E119 Type 2 diabetes mellitus without complications: Secondary | ICD-10-CM | POA: Diagnosis present

## 2019-08-02 DIAGNOSIS — Z791 Long term (current) use of non-steroidal anti-inflammatories (NSAID): Secondary | ICD-10-CM | POA: Diagnosis not present

## 2019-08-02 DIAGNOSIS — I10 Essential (primary) hypertension: Secondary | ICD-10-CM

## 2019-08-02 DIAGNOSIS — J849 Interstitial pulmonary disease, unspecified: Secondary | ICD-10-CM | POA: Diagnosis present

## 2019-08-02 DIAGNOSIS — J9611 Chronic respiratory failure with hypoxia: Secondary | ICD-10-CM | POA: Diagnosis not present

## 2019-08-02 DIAGNOSIS — Z794 Long term (current) use of insulin: Secondary | ICD-10-CM

## 2019-08-02 DIAGNOSIS — Z833 Family history of diabetes mellitus: Secondary | ICD-10-CM | POA: Diagnosis not present

## 2019-08-02 DIAGNOSIS — J679 Hypersensitivity pneumonitis due to unspecified organic dust: Secondary | ICD-10-CM | POA: Diagnosis present

## 2019-08-02 DIAGNOSIS — I5032 Chronic diastolic (congestive) heart failure: Secondary | ICD-10-CM | POA: Diagnosis present

## 2019-08-02 DIAGNOSIS — Z8249 Family history of ischemic heart disease and other diseases of the circulatory system: Secondary | ICD-10-CM | POA: Diagnosis not present

## 2019-08-02 DIAGNOSIS — J9621 Acute and chronic respiratory failure with hypoxia: Secondary | ICD-10-CM | POA: Diagnosis present

## 2019-08-02 DIAGNOSIS — R609 Edema, unspecified: Secondary | ICD-10-CM | POA: Diagnosis not present

## 2019-08-02 DIAGNOSIS — Z90710 Acquired absence of both cervix and uterus: Secondary | ICD-10-CM | POA: Diagnosis not present

## 2019-08-02 DIAGNOSIS — K219 Gastro-esophageal reflux disease without esophagitis: Secondary | ICD-10-CM | POA: Diagnosis present

## 2019-08-02 DIAGNOSIS — E785 Hyperlipidemia, unspecified: Secondary | ICD-10-CM | POA: Diagnosis present

## 2019-08-02 DIAGNOSIS — R682 Dry mouth, unspecified: Secondary | ICD-10-CM | POA: Diagnosis present

## 2019-08-02 DIAGNOSIS — I5033 Acute on chronic diastolic (congestive) heart failure: Secondary | ICD-10-CM | POA: Diagnosis present

## 2019-08-02 DIAGNOSIS — Z20822 Contact with and (suspected) exposure to covid-19: Secondary | ICD-10-CM | POA: Diagnosis present

## 2019-08-02 DIAGNOSIS — F419 Anxiety disorder, unspecified: Secondary | ICD-10-CM | POA: Diagnosis present

## 2019-08-02 DIAGNOSIS — Z888 Allergy status to other drugs, medicaments and biological substances status: Secondary | ICD-10-CM | POA: Diagnosis not present

## 2019-08-02 DIAGNOSIS — Z6841 Body Mass Index (BMI) 40.0 and over, adult: Secondary | ICD-10-CM | POA: Diagnosis not present

## 2019-08-02 DIAGNOSIS — I110 Hypertensive heart disease with heart failure: Secondary | ICD-10-CM | POA: Diagnosis present

## 2019-08-02 DIAGNOSIS — R251 Tremor, unspecified: Secondary | ICD-10-CM | POA: Diagnosis present

## 2019-08-02 LAB — CBC WITH DIFFERENTIAL/PLATELET
Abs Immature Granulocytes: 0.05 10*3/uL (ref 0.00–0.07)
Basophils Absolute: 0 10*3/uL (ref 0.0–0.1)
Basophils Relative: 0 %
Eosinophils Absolute: 0 10*3/uL (ref 0.0–0.5)
Eosinophils Relative: 0 %
HCT: 47.2 % — ABNORMAL HIGH (ref 36.0–46.0)
Hemoglobin: 14.5 g/dL (ref 12.0–15.0)
Immature Granulocytes: 1 %
Lymphocytes Relative: 45 %
Lymphs Abs: 2.8 10*3/uL (ref 0.7–4.0)
MCH: 31.5 pg (ref 26.0–34.0)
MCHC: 30.7 g/dL (ref 30.0–36.0)
MCV: 102.4 fL — ABNORMAL HIGH (ref 80.0–100.0)
Monocytes Absolute: 1.4 10*3/uL — ABNORMAL HIGH (ref 0.1–1.0)
Monocytes Relative: 23 %
Neutro Abs: 2 10*3/uL (ref 1.7–7.7)
Neutrophils Relative %: 31 %
Platelets: 69 10*3/uL — ABNORMAL LOW (ref 150–400)
RBC: 4.61 MIL/uL (ref 3.87–5.11)
RDW: 21.2 % — ABNORMAL HIGH (ref 11.5–15.5)
WBC: 6.3 10*3/uL (ref 4.0–10.5)
nRBC: 2.2 % — ABNORMAL HIGH (ref 0.0–0.2)

## 2019-08-02 LAB — GLUCOSE, CAPILLARY
Glucose-Capillary: 195 mg/dL — ABNORMAL HIGH (ref 70–99)
Glucose-Capillary: 253 mg/dL — ABNORMAL HIGH (ref 70–99)

## 2019-08-02 LAB — HEPATIC FUNCTION PANEL
ALT: 17 U/L (ref 0–44)
AST: 27 U/L (ref 15–41)
Albumin: 3.8 g/dL (ref 3.5–5.0)
Alkaline Phosphatase: 68 U/L (ref 38–126)
Bilirubin, Direct: 0.4 mg/dL — ABNORMAL HIGH (ref 0.0–0.2)
Indirect Bilirubin: 1.1 mg/dL — ABNORMAL HIGH (ref 0.3–0.9)
Total Bilirubin: 1.5 mg/dL — ABNORMAL HIGH (ref 0.3–1.2)
Total Protein: 7.2 g/dL (ref 6.5–8.1)

## 2019-08-02 LAB — CBG MONITORING, ED
Glucose-Capillary: 202 mg/dL — ABNORMAL HIGH (ref 70–99)
Glucose-Capillary: 221 mg/dL — ABNORMAL HIGH (ref 70–99)
Glucose-Capillary: 214 mg/dL — ABNORMAL HIGH (ref 70–99)

## 2019-08-02 LAB — HIV ANTIBODY (ROUTINE TESTING W REFLEX): HIV Screen 4th Generation wRfx: NONREACTIVE

## 2019-08-02 LAB — MAGNESIUM: Magnesium: 1.9 mg/dL (ref 1.7–2.4)

## 2019-08-02 LAB — HEMOGLOBIN A1C
Hgb A1c MFr Bld: 8 % — ABNORMAL HIGH (ref 4.8–5.6)
Mean Plasma Glucose: 182.9 mg/dL

## 2019-08-02 LAB — SARS CORONAVIRUS 2 (TAT 6-24 HRS): SARS Coronavirus 2: NEGATIVE

## 2019-08-02 LAB — BRAIN NATRIURETIC PEPTIDE: B Natriuretic Peptide: 93.4 pg/mL (ref 0.0–100.0)

## 2019-08-02 MED ORDER — CYCLOBENZAPRINE HCL 10 MG PO TABS
5.0000 mg | ORAL_TABLET | Freq: Once | ORAL | Status: AC
  Administered 2019-08-02: 5 mg via ORAL
  Filled 2019-08-02: qty 1

## 2019-08-02 MED ORDER — GABAPENTIN 300 MG PO CAPS
300.0000 mg | ORAL_CAPSULE | Freq: Three times a day (TID) | ORAL | Status: DC
  Administered 2019-08-02 – 2019-08-09 (×22): 300 mg via ORAL
  Filled 2019-08-02 (×22): qty 1

## 2019-08-02 MED ORDER — FUROSEMIDE 10 MG/ML IJ SOLN
80.0000 mg | Freq: Once | INTRAMUSCULAR | Status: AC
  Administered 2019-08-02: 80 mg via INTRAVENOUS
  Filled 2019-08-02: qty 8

## 2019-08-02 MED ORDER — PANTOPRAZOLE SODIUM 40 MG PO TBEC
40.0000 mg | DELAYED_RELEASE_TABLET | Freq: Every day | ORAL | Status: DC
  Administered 2019-08-02 – 2019-08-12 (×11): 40 mg via ORAL
  Filled 2019-08-02 (×11): qty 1

## 2019-08-02 MED ORDER — FUROSEMIDE 10 MG/ML IJ SOLN
60.0000 mg | Freq: Two times a day (BID) | INTRAMUSCULAR | Status: DC
  Administered 2019-08-02 – 2019-08-10 (×17): 60 mg via INTRAVENOUS
  Filled 2019-08-02 (×17): qty 6

## 2019-08-02 MED ORDER — ACETAMINOPHEN 500 MG PO TABS
1000.0000 mg | ORAL_TABLET | Freq: Once | ORAL | Status: AC
  Administered 2019-08-02: 1000 mg via ORAL
  Filled 2019-08-02: qty 2

## 2019-08-02 MED ORDER — ONDANSETRON HCL 4 MG/2ML IJ SOLN
4.0000 mg | Freq: Four times a day (QID) | INTRAMUSCULAR | Status: DC | PRN
  Administered 2019-08-03 – 2019-08-04 (×3): 4 mg via INTRAVENOUS
  Filled 2019-08-02 (×3): qty 2

## 2019-08-02 MED ORDER — ALBUTEROL SULFATE (2.5 MG/3ML) 0.083% IN NEBU: 2.5000 mg | INHALATION_SOLUTION | Freq: Four times a day (QID) | RESPIRATORY_TRACT | Status: DC | PRN

## 2019-08-02 MED ORDER — INSULIN ASPART 100 UNIT/ML ~~LOC~~ SOLN
0.0000 [IU] | Freq: Three times a day (TID) | SUBCUTANEOUS | Status: DC
  Administered 2019-08-02: 2 [IU] via SUBCUTANEOUS
  Administered 2019-08-02: 3 [IU] via SUBCUTANEOUS
  Administered 2019-08-03 (×3): 2 [IU] via SUBCUTANEOUS
  Administered 2019-08-04: 1 [IU] via SUBCUTANEOUS
  Administered 2019-08-04 (×2): 2 [IU] via SUBCUTANEOUS
  Administered 2019-08-05: 1 [IU] via SUBCUTANEOUS
  Administered 2019-08-05 – 2019-08-06 (×4): 2 [IU] via SUBCUTANEOUS
  Administered 2019-08-06 – 2019-08-07 (×2): 3 [IU] via SUBCUTANEOUS
  Administered 2019-08-07: 2 [IU] via SUBCUTANEOUS
  Administered 2019-08-07: 3 [IU] via SUBCUTANEOUS
  Administered 2019-08-08: 2 [IU] via SUBCUTANEOUS
  Administered 2019-08-08: 3 [IU] via SUBCUTANEOUS
  Administered 2019-08-08: 1 [IU] via SUBCUTANEOUS
  Administered 2019-08-09: 5 [IU] via SUBCUTANEOUS
  Administered 2019-08-09: 3 [IU] via SUBCUTANEOUS
  Administered 2019-08-10: 2 [IU] via SUBCUTANEOUS
  Administered 2019-08-10 (×2): 5 [IU] via SUBCUTANEOUS
  Administered 2019-08-11 (×2): 2 [IU] via SUBCUTANEOUS
  Administered 2019-08-11: 9 [IU] via SUBCUTANEOUS
  Administered 2019-08-12: 2 [IU] via SUBCUTANEOUS

## 2019-08-02 MED ORDER — SODIUM CHLORIDE 0.9% FLUSH: 3.0000 mL | INTRAVENOUS | Status: DC | PRN

## 2019-08-02 MED ORDER — LEVOCETIRIZINE DIHYDROCHLORIDE 5 MG PO TABS: 5.0000 mg | ORAL_TABLET | Freq: Every day | ORAL | Status: DC

## 2019-08-02 MED ORDER — PRAVASTATIN SODIUM 40 MG PO TABS
40.0000 mg | ORAL_TABLET | Freq: Every day | ORAL | Status: DC
  Administered 2019-08-02 – 2019-08-11 (×10): 40 mg via ORAL
  Filled 2019-08-02 (×10): qty 1

## 2019-08-02 MED ORDER — ALBUTEROL SULFATE HFA 108 (90 BASE) MCG/ACT IN AERS
1.0000 | INHALATION_SPRAY | Freq: Four times a day (QID) | RESPIRATORY_TRACT | Status: DC | PRN
  Filled 2019-08-02: qty 6.7

## 2019-08-02 MED ORDER — SERTRALINE HCL 100 MG PO TABS
100.0000 mg | ORAL_TABLET | Freq: Every day | ORAL | Status: DC
  Administered 2019-08-02 – 2019-08-12 (×11): 100 mg via ORAL
  Filled 2019-08-02 (×12): qty 1

## 2019-08-02 MED ORDER — SODIUM CHLORIDE 0.9% FLUSH
3.0000 mL | Freq: Two times a day (BID) | INTRAVENOUS | Status: DC
  Administered 2019-08-02 – 2019-08-11 (×15): 3 mL via INTRAVENOUS

## 2019-08-02 MED ORDER — TIZANIDINE HCL 4 MG PO TABS
4.0000 mg | ORAL_TABLET | Freq: Four times a day (QID) | ORAL | Status: DC | PRN
  Administered 2019-08-02 – 2019-08-09 (×8): 4 mg via ORAL
  Filled 2019-08-02 (×9): qty 1

## 2019-08-02 MED ORDER — METOPROLOL SUCCINATE ER 100 MG PO TB24
200.0000 mg | ORAL_TABLET | Freq: Every day | ORAL | Status: DC
  Administered 2019-08-02 – 2019-08-12 (×11): 200 mg via ORAL
  Filled 2019-08-02 (×6): qty 2
  Filled 2019-08-02: qty 8
  Filled 2019-08-02 (×4): qty 2

## 2019-08-02 MED ORDER — MONTELUKAST SODIUM 10 MG PO TABS
10.0000 mg | ORAL_TABLET | Freq: Every day | ORAL | Status: DC
  Administered 2019-08-02 – 2019-08-09 (×8): 10 mg via ORAL
  Filled 2019-08-02 (×8): qty 1

## 2019-08-02 MED ORDER — ACETAMINOPHEN 325 MG PO TABS
650.0000 mg | ORAL_TABLET | ORAL | Status: DC | PRN
  Administered 2019-08-02 – 2019-08-11 (×10): 650 mg via ORAL
  Filled 2019-08-02 (×10): qty 2

## 2019-08-02 MED ORDER — LORATADINE 10 MG PO TABS
10.0000 mg | ORAL_TABLET | Freq: Every day | ORAL | Status: DC
  Administered 2019-08-02 – 2019-08-09 (×8): 10 mg via ORAL
  Filled 2019-08-02 (×8): qty 1

## 2019-08-02 MED ORDER — SODIUM CHLORIDE 0.9 % IV SOLN
250.0000 mL | INTRAVENOUS | Status: DC | PRN
  Administered 2019-08-06: 250 mL via INTRAVENOUS

## 2019-08-02 MED ORDER — INSULIN GLARGINE 100 UNIT/ML ~~LOC~~ SOLN
20.0000 [IU] | Freq: Two times a day (BID) | SUBCUTANEOUS | Status: DC
  Administered 2019-08-02 – 2019-08-12 (×20): 20 [IU] via SUBCUTANEOUS
  Filled 2019-08-02 (×22): qty 0.2

## 2019-08-02 NOTE — ED Provider Notes (Signed)
[facility] EMERGENCY DEPARTMENT Provider Note  CSN: [id number] Arrival date & time: 08/01/19 1412  Chief Complaint(s) Shortness of Breath  HPI Kathy Irwin is a 69 y.o. female with a past medical history listed below including diastolic heart failure on Lasix and metolazone with 3 L nasal cannula as needed at home, interstitial lung disease who presents to the emergency department with several weeks of gradually worsening shortness of breath with exertion, orthopnea and peripheral edema.  Patient's ADLs are limited due to her increase shortness of breath.  Patient now short of breath even with 3 L nasal cannula.  Patient denies any fevers or chills.  She has had a chronic cough for several years productive of clear sputum.  No overt chest pain.  No abdominal pain.  No nausea vomiting.  No other physical complaints.  HPI  Past Medical History Past Medical History:  Diagnosis Date  . Acute respiratory failure with hypoxia ([place]) 12/2018  . Diabetes mellitus without complication ([place])   . 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 Hypertension   . ILD (interstitial lung disease) ([place]) 12/24/2018   Patient Active Problem List   Diagnosis Date Noted  . Atherosclerosis of aorta ([place]) 01/23/2019  . Interstitial lung disease ([place]) 12/24/2018  . Acute diastolic CHF (congestive heart failure) ([place]) 12/24/2018  . Obesity, Class III, BMI 40-49.9 (morbid obesity) ([place]) 12/24/2018  . Pneumonia due to COVID-19 virus 11/15/2018  . Acute respiratory failure with hypoxia ([place]) 12/19/2017  . 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 Thrombocytopenia ([place]) 12/19/2017  . DM (diabetes mellitus) ([place]) 12/17/2017  . HTN (hypertension) 12/17/2017  . Essential hypertension 11/05/2017  . 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 Authorizing Provider  acetaminophen (TYLENOL) 500 MG  tablet Take 1,000 mg by mouth 2 (two) times daily as needed (for pain).    [provider]  albuterol (VENTOLIN HFA) 108 (90 Base) MCG/ACT inhaler Inhale 1-2 puffs into the lungs every 6 (six) hours as needed for wheezing or shortness of breath. 07/24/19   Wieters, Hallie C, PA-C  Azelastine-Fluticasone (DYMISTA) 137-50 MCG/ACT SUSP Place 1-2 sprays into both nostrils daily as needed (for allergies). 05/15/19   Kerin Perna, NP  blood glucose meter kit and supplies KIT Dispense based on patient and insurance preference. Use up to four times daily as directed. (FOR ICD-9 250.00, 250.01). For QAC - HS accuchecks. 11/22/18   Thurnell Lose, MD  furosemide (LASIX) 40 MG tablet Take 40 mg by mouth 2 (two) times daily.    [provider]  gabapentin (NEURONTIN) 300 MG capsule Take 1 capsule (300 mg total) by mouth 3 (three) times daily. 05/15/19   Kerin Perna, NP  glimepiride (AMARYL) 2 MG tablet Take 1 tablet (2 mg total) by mouth daily with breakfast. 05/15/19   Kerin Perna, NP  glucose blood (FREESTYLE LITE) test strip For glucose testing every before meals at bedtime. Diagnosis E 11.65  Can substitute to any accepted brand 11/22/18   Thurnell Lose, MD  Insulin Lispro Prot & Lispro (HUMALOG MIX 75/25 KWIKPEN) (75-25) 100 UNIT/ML Kwikpen Inject 80 Units into the skin See admin instructions. Inject 50 units into the skin before breakfast "and a second dose at bedtime 30 units 05/15/19   Kerin Perna, NP  meloxicam (MOBIC) 7.5 MG tablet Take 1 tablet (7.5 mg total) by mouth daily for 10 days. Take in the morning,  with food. 07/24/19 08/03/19  Wieters, Hallie C, PA-C  metolazone (ZAROXOLYN) 2.5 MG tablet Take 2.5 mg by mouth three times a week, take 30 minutes before taking lasix 05/06/19   Troy Sine, MD  metoprolol (TOPROL-XL) 200 MG 24 hr tablet Take 200 mg by mouth daily.    [provider]  montelukast (SINGULAIR) 10 MG tablet Take 10 mg by mouth at  bedtime.  05/31/19   [provider]  olopatadine (PATANOL) 0.1 % ophthalmic solution Place 1 drop into both eyes 2 (two) times daily. 07/24/19   Wieters, Hallie C, PA-C  omeprazole (PRILOSEC) 20 MG capsule Take 1 capsule (20 mg total) by mouth daily. 12/26/18   Hongalgi, Lenis Dickinson, MD  pravastatin (PRAVACHOL) 10 MG tablet TAKE 1 TABLET BY MOUTH EVERY DAY 06/23/19   Kerin Perna, NP  predniSONE (DELTASONE) 10 MG tablet Take 4 tabs po daily x 3 days; then 3 tabs daily x3 days; then 2 tabs daily x3 days; then 1 tab daily x 3 days; then stop 08/01/19   Martyn Ehrich, NP  predniSONE (DELTASONE) 20 MG tablet Take 1 tablet (20 mg total) by mouth daily with breakfast. 05/02/19   Mannam, Hart Robinsons, MD  sertraline (ZOLOFT) 50 MG tablet Take 1 tablet (50 mg total) by mouth every evening. Patient taking differently: Take 100 mg by mouth as needed.  01/09/19   Kerin Perna, NP  tiZANidine (ZANAFLEX) 4 MG tablet Take 1 tablet (4 mg total) by mouth every 6 (six) hours as needed for muscle spasms (back pain). 07/24/19   Wieters, Hallie C, PA-C  VITAMIN D PO Take 1 tablet by mouth daily.    [provider]                                                                                                                                    Past Surgical History Past Surgical History:  Procedure Laterality Date  . ABDOMINAL HYSTERECTOMY    . VIDEO BRONCHOSCOPY Bilateral 04/02/2019   Procedure: VIDEO BRONCHOSCOPY WITH FLUORO;  Surgeon: Marshell Garfinkel, MD;  Location: [facility];  Service: Cardiopulmonary;  Laterality: Bilateral;   Family History Family History  Problem Relation Age of Onset  . Diabetes Other   . Hypertension Other     Social History Social History   Tobacco Use  . Smoking status: Never Smoker  . Smokeless tobacco: Never Used  Substance Use Topics  . Alcohol use: Never  . Drug use: Never   Allergies Other, Iodine, and Merbromin  Review of Systems Review of  Systems All other systems are reviewed and are negative for acute change except as noted in the HPI  Physical Exam Vital Signs  I have reviewed the triage vital signs BP 125/74 (BP Location: Right Arm)   Pulse 69   Temp 99.2 F (37.3 C) (Oral)   Resp 16   Ht 5' 4.5" (1.638 m)  Wt 117.9 kg   SpO2 97%   BMI 43.94 kg/m   Physical Exam Vitals reviewed.  Constitutional:      General: She is not in acute distress.    Appearance: She is well-developed. She is not diaphoretic.  HENT:     Head: Normocephalic and atraumatic.     Nose: Nose normal.  Eyes:     General: No scleral icterus.       Right eye: No discharge.        Left eye: No discharge.     Conjunctiva/sclera: Conjunctivae normal.     Pupils: Pupils are equal, round, and reactive to light.  Cardiovascular:     Rate and Rhythm: Normal rate and regular rhythm.     Heart sounds: No murmur. No friction rub. No gallop.   Pulmonary:     Effort: Pulmonary effort is normal. Tachypnea present. No respiratory distress.     Breath sounds: No stridor. Examination of the right-lower field reveals rales. Examination of the left-lower field reveals rales. Rales (faint) present.  Abdominal:     General: There is no distension.     Palpations: Abdomen is soft.     Tenderness: There is no abdominal tenderness.  Musculoskeletal:        General: No tenderness.     Cervical back: Normal range of motion and neck supple.     Right lower leg: 1+ Pitting Edema present.     Left lower leg: 1+ Pitting Edema present.  Skin:    General: Skin is warm and dry.     Findings: No erythema or rash.  Neurological:     Mental Status: She is alert and oriented to person, place, and time.     ED Results and Treatments Labs (all labs ordered are listed, but only abnormal results are displayed) Labs Reviewed  BASIC METABOLIC PANEL - Abnormal; Notable for the following components:      Result Value   Glucose, Bld 153 (*)    BUN 7 (*)    All  other components within normal limits  CBC - Abnormal; Notable for the following components:   HCT 46.2 (*)    MCV 100.4 (*)    RDW 21.2 (*)    Platelets 67 (*)    nRBC 2.2 (*)    All other components within normal limits  SARS CORONAVIRUS 2 (TAT 6-24 HRS)  BRAIN NATRIURETIC PEPTIDE  TROPONIN I (HIGH SENSITIVITY)  TROPONIN I (HIGH SENSITIVITY)                                                                                                                         EKG  EKG Interpretation  Date/Time:  Friday August 01 2019 14:26:13 EDT Ventricular Rate:  80 PR Interval:  132 QRS Duration: 84 QT Interval:  394 QTC Calculation: 454 R Axis:   -23 Text Interpretation: Normal sinus rhythm Minimal voltage criteria for LVH, may be normal variant ( R in aVL ) Borderline  ECG No significant change since last tracing Confirmed by Merrily Pew [phone]) on 08/02/2019 12:03:57 AM      Radiology DG Chest 2 View  Result Date: 08/01/2019 CLINICAL DATA:  Hypoxia with shortness of breath EXAM: CHEST - 2 VIEW COMPARISON:  April 02, 2019 FINDINGS: There is cardiomegaly with pulmonary venous hypertension. There is interstitial thickening which may be indicative of a degree of interstitial edema. No appreciable airspace opacity or pleural effusion. No adenopathy. No bone lesions. IMPRESSION: Cardiomegaly with pulmonary vascular congestion. Suspect mild interstitial edema without airspace consolidation. No adenopathy. Electronically Signed   By: Lowella Grip III M.D.   On: 08/01/2019 15:14    Pertinent labs & imaging results that were available during my care of the patient were reviewed by me and considered in my medical decision making (see chart for details).  Medications Ordered in ED Medications  acetaminophen (TYLENOL) tablet 1,000 mg (has no administration in time range)  cyclobenzaprine (FLEXERIL) tablet 5 mg (has no administration in time range)  furosemide (LASIX) injection 80 mg (has no  administration in time range)                                                                                                                                    Procedures .Critical Care Performed by: Fatima Blank, MD Authorized by: Fatima Blank, MD    CRITICAL CARE Performed by: Grayce Sessions Sehaj Kolden Total critical care time: 35 minutes Critical care time was exclusive of separately billable procedures and treating other patients. Critical care was necessary to treat or prevent imminent or life-threatening deterioration. Critical care was time spent personally by me on the following activities: development of treatment plan with patient and/or surrogate as well as nursing, discussions with consultants, evaluation of patient's response to treatment, examination of patient, obtaining history from patient or surrogate, ordering and performing treatments and interventions, ordering and review of laboratory studies, ordering and review of radiographic studies, pulse oximetry and re-evaluation of patient's condition.   (including critical care time)  Medical Decision Making / ED Course I have reviewed the nursing notes for this encounter and the patient's prior records (if available in EHR or on provided paperwork).   Natsumi Whitsitt was evaluated in Emergency Department on 08/02/2019 for the symptoms described in the history of present illness. She was evaluated in the context of the global COVID-19 pandemic, which necessitated consideration that the patient might be at risk for infection with the SARS-CoV-2 virus that causes COVID-19. Institutional protocols and algorithms that pertain to the evaluation of patients at risk for COVID-19 are in a state of rapid change based on information released by regulatory bodies including the CDC and federal and state organizations. These policies and algorithms were followed during the patient's care in the ED.  Patient presents with  gradually worsening shortness of breath. Evidence of volume overload on exam. Prior history of diastolic heart failure.  Recent echo in January showed preserved EF. Patient reports being compliant with her medication at home. She has increased work of breathing even at rest.  She desats into the 80s on room air.  Improves on 3 L nasal cannula but desats on 3 L with minimal exertion and ambulation. Chest x-ray with evidence of pulmonary edema. Intact renal function. EKG without acute ischemic changes.  Troponin negative.  Presentation is concerning for CHF exacerbation.  BNP pending.  Will provide patient with IV Lasix and admit for IV diuresing given her increased work of breathing and hypoxia even on 3 L nasal cannula.       Final Clinical Impression(s) / ED Diagnoses Final diagnoses:  Acute on chronic diastolic congestive heart failure ([place])  Hypoxia      This chart was dictated using voice recognition software.  Despite best efforts to proofread,  errors can occur which can change the documentation meaning.   Fatima Blank, MD 08/02/19 [phone]

## 2019-08-02 NOTE — ED Notes (Signed)
Tele   bfast ordered

## 2019-08-02 NOTE — Progress Notes (Addendum)
Patient is sitting up in chair reports less short of breath, but not at her baseline.  Report continue to have bilateral lower extremity edema.  Denies chest pain, she has some productive cough white sputum, no fever.  Lung exam diminished, no wheezing, bilateral lower extremity pitting edema, chronic venous stasis skin changes.  Acute on chronic diastolic CHF exacerbation, acute on chronic hypoxic respiratory failure  continue IV Lasix. ( report on metolazone 3 times a week and daily Lasix at home)  Bilateral lower extremity edema we will get venous Doppler to rule out DVT  Thrombocytopenia, currently no sign of bleeding, monitor Consult to Dr. Irene Limbo  on Monday  History of hypersensitivity pneumonitis, report she was on prednisone for a few months this was weaned off in March this year.  Reports she is under evaluation for thyroidectomy, however she is awaiting hematology clearance due to her history of thrombocytopenia.

## 2019-08-02 NOTE — ED Notes (Signed)
Pt denies using supplemental O2 at home so pt was ambulated w/o O2.When seated and upon standing pts O2 was 100%. While ambulating O2 was steady b/w 100-98% but respirations got up to 43. When pt sat down following ambulation, O2 dropped to 85% and respirations down to 28. Supplemental O2 at 3L/min was replaced and O2 returned to 100%

## 2019-08-02 NOTE — H&P (Signed)
History and Physical    Kathy Irwin CHY:[id number] DOB: 10/27/1950 DOA: 08/01/2019  PCP: Kerin Perna, NP   Patient coming from: Home   Chief Complaint: Home   HPI: Kathy Irwin is a 69 y.o. female with medical history significant for interstitial lung disease, insulin-dependent diabetes mellitus, hypertension, chronic thrombocytopenia, and chronic diastolic CHF, now presenting to the emergency department with progressive exertional dyspnea.  Patient reports that she had some rhinorrhea and sinus congestion a couple weeks ago, began to experience increased shortness of breath around the same time, was treated with cephalexin and albuterol, and while the upper respiratory symptoms abated, dyspnea continued to worsen.  She describes some chest "tightness" when she tries to take a deep breath but denies chest pain.  She has not noticed any fevers or chills.  She reports increase in her chronic bilateral lower extremity edema and notes that there has been some clear fluid weeping from her lower legs. She uses 3 Lpm supplemental oxygen at home with activity.   ED Course: Upon arrival to the ED, patient is found to be afebrile, saturating low 80s on room air, and with stable blood pressure.  EKG features sinus rhythm and chest x-ray with cardiomegaly, vascular congestion, and suspected interstitial edema.  Chemistry panel unremarkable and CBC notable for a chronic thrombocytopenia, platelets now 67,000.  Patient was treated with 80 mg IV Lasix in the ED.  She continues to desaturate to the mid 80s on 3 L/min of supplemental oxygen and hospitalists asked to admit.  Review of Systems:  All other systems reviewed and apart from HPI, are negative.  Past Medical History:  Diagnosis Date  . Acute respiratory failure with hypoxia ([place]) 12/2018  . Diabetes mellitus without complication ([place])   . Hypersensitivity pneumonitis ([place]) 12/19/2017  . Hypertension   . ILD (interstitial lung disease) ([place])  12/24/2018    Past Surgical History:  Procedure Laterality Date  . ABDOMINAL HYSTERECTOMY    . VIDEO BRONCHOSCOPY Bilateral 04/02/2019   Procedure: VIDEO BRONCHOSCOPY WITH FLUORO;  Surgeon: Marshell Garfinkel, MD;  Location: [facility];  Service: Cardiopulmonary;  Laterality: Bilateral;     reports that she has never smoked. She has never used smokeless tobacco. She reports that she does not drink alcohol or use drugs.  Allergies  Allergen Reactions  . Other Shortness Of Breath and Other (See Comments)    Newspaper ink =  new chest pain, also  . Iodine Other (See Comments)    "Was a long time ago" (Reaction??)  . Merbromin Other (See Comments)    Mercurochrome- "Was a long time ago" (Reaction??)    Family History  Problem Relation Age of Onset  . Diabetes Other   . Hypertension Other      Prior to Admission medications   Medication Sig Start Date End Date Taking? Authorizing Provider  acetaminophen (TYLENOL) 500 MG tablet Take 1,000 mg by mouth 2 (two) times daily as needed (for pain).   Yes [provider]  albuterol (VENTOLIN HFA) 108 (90 Base) MCG/ACT inhaler Inhale 1-2 puffs into the lungs every 6 (six) hours as needed for wheezing or shortness of breath. 07/24/19  Yes Wieters, Hallie C, PA-C  Azelastine-Fluticasone (DYMISTA) 137-50 MCG/ACT SUSP Place 1-2 sprays into both nostrils daily as needed (for allergies). 05/15/19  Yes Kerin Perna, NP  [employer] D3 125 MCG (5000 UT) capsule Take 5,000 Units by mouth daily. 07/16/19  Yes [provider]  fluticasone (FLONASE) 50 MCG/ACT nasal spray Place 2 sprays  into both nostrils daily as needed for allergies or rhinitis.  07/15/19  Yes [provider]  furosemide (LASIX) 40 MG tablet Take 40 mg by mouth daily.    Yes [provider]  gabapentin (NEURONTIN) 300 MG capsule Take 1 capsule (300 mg total) by mouth 3 (three) times daily. 05/15/19  Yes Kerin Perna, NP  glimepiride (AMARYL) 2 MG tablet  Take 1 tablet (2 mg total) by mouth daily with breakfast. 05/15/19  Yes Kerin Perna, NP  Insulin Lispro Prot & Lispro (HUMALOG MIX 75/25 KWIKPEN) (75-25) 100 UNIT/ML Kwikpen Inject 80 Units into the skin See admin instructions. Inject 50 units into the skin before breakfast "and a second dose at bedtime 30 units Patient taking differently: Inject 30-50 Units into the skin See admin instructions. Inject 50 units into the skin before breakfast and a second dose at bedtime 30 units 05/15/19  Yes Kerin Perna, NP  levocetirizine (XYZAL) 5 MG tablet Take 1 tablet by mouth daily. 07/15/19  Yes [provider]  meloxicam (MOBIC) 7.5 MG tablet Take 1 tablet (7.5 mg total) by mouth daily for 10 days. Take in the morning, with food. 07/24/19 08/03/19 Yes Wieters, Hallie C, PA-C  metolazone (ZAROXOLYN) 2.5 MG tablet Take 2.5 mg by mouth three times a week, take 30 minutes before taking lasix 05/06/19  Yes Troy Sine, MD  metoprolol (TOPROL-XL) 200 MG 24 hr tablet Take 200 mg by mouth daily.   Yes [provider]  montelukast (SINGULAIR) 10 MG tablet Take 10 mg by mouth at bedtime.  05/31/19  Yes [provider]  olopatadine (PATANOL) 0.1 % ophthalmic solution Place 1 drop into both eyes 2 (two) times daily. Patient taking differently: Place 1 drop into both eyes 2 (two) times daily as needed for allergies.  07/24/19  Yes Wieters, Hallie C, PA-C  omeprazole (PRILOSEC) 20 MG capsule Take 1 capsule (20 mg total) by mouth daily. Patient taking differently: Take 20 mg by mouth daily as needed (GERD).  12/26/18  Yes Hongalgi, Lenis Dickinson, MD  pravastatin (PRAVACHOL) 40 MG tablet Take 40 mg by mouth at bedtime.   Yes [provider]  sertraline (ZOLOFT) 100 MG tablet Take 100 mg by mouth daily as needed (Anxiety).   Yes [provider]  tiZANidine (ZANAFLEX) 4 MG tablet Take 1 tablet (4 mg total) by mouth every 6 (six) hours as needed for muscle spasms (back pain).  07/24/19  Yes Wieters, Hallie C, PA-C  blood glucose meter kit and supplies KIT Dispense based on patient and insurance preference. Use up to four times daily as directed. (FOR ICD-9 250.00, 250.01). For QAC - HS accuchecks. 11/22/18   Thurnell Lose, MD  glucose blood (FREESTYLE LITE) test strip For glucose testing every before meals at bedtime. Diagnosis E 11.65  Can substitute to any accepted brand 11/22/18   Thurnell Lose, MD    Physical Exam: Vitals:   08/01/19 1417 08/01/19 1423 08/01/19 1623 08/01/19 2013  BP: (!) 179/82  138/78 125/74  Pulse: 83  74 69  Resp: (!) _0 Temp: 97.6 F (36.4 C)  99.2 F (37.3 C)   TempSrc: Oral  Oral   SpO2: (!) 83% 91% 98% 97%  Weight: 117.9 kg     Height: 5' 4.5" (1.638 m)        Constitutional: NAD, calm  Eyes: PERTLA, lids and conjunctivae normal ENMT: Mucous membranes are moist. Posterior pharynx clear of any exudate  or lesions.   Neck: normal, supple, no masses, no thyromegaly Respiratory:  no wheezing, no crackles. No accessory muscle use.  Cardiovascular: S1 & S2 heard, regular rate and rhythm. Pretibial pitting edema bilaterally.   Abdomen: No distension, no tenderness, soft. Bowel sounds active.  Musculoskeletal: no clubbing / cyanosis. No joint deformity upper and lower extremities.   Skin: no significant rashes, lesions, ulcers. Warm, dry, well-perfused. Neurologic: No facial asymmetry. Sensation intact. Moving all extremities.  Psychiatric: Alert and oriented to person, place, and situation. Very pleasant and cooperative.    Labs and Imaging on Admission: I have personally reviewed following labs and imaging studies  CBC: Recent Labs  Lab 08/01/19 1429  WBC 8.8  HGB 14.6  HCT 46.2*  MCV 100.4*  PLT 67*   Basic Metabolic Panel: Recent Labs  Lab 08/01/19 1429  NA 139  K 4.5  CL 104  CO2 26  GLUCOSE 153*  BUN 7*  CREATININE 0.70  CALCIUM 9.9   GFR: Estimated Creatinine Clearance: 84.6 mL/min (by C-G  formula based on SCr of 0.7 mg/dL). Liver Function Tests: No results for input(s): AST, ALT, ALKPHOS, BILITOT, PROT, ALBUMIN in the last 168 hours. No results for input(s): LIPASE, AMYLASE in the last 168 hours. No results for input(s): AMMONIA in the last 168 hours. Coagulation Profile: No results for input(s): INR, PROTIME in the last 168 hours. Cardiac Enzymes: No results for input(s): CKTOTAL, CKMB, CKMBINDEX, TROPONINI in the last 168 hours. BNP (last 3 results) Recent Labs    03/03/19 1151  PROBNP 70.0   HbA1C: No results for input(s): HGBA1C in the last 72 hours. CBG: No results for input(s): GLUCAP in the last 168 hours. Lipid Profile: No results for input(s): CHOL, HDL, LDLCALC, TRIG, CHOLHDL, LDLDIRECT in the last 72 hours. Thyroid Function Tests: No results for input(s): TSH, T4TOTAL, FREET4, T3FREE, THYROIDAB in the last 72 hours. Anemia Panel: No results for input(s): VITAMINB12, FOLATE, FERRITIN, TIBC, IRON, RETICCTPCT in the last 72 hours. Urine analysis: No results found for: COLORURINE, APPEARANCEUR, LABSPEC, PHURINE, GLUCOSEU, HGBUR, BILIRUBINUR, KETONESUR, PROTEINUR, UROBILINOGEN, NITRITE, LEUKOCYTESUR Sepsis Labs: _0 (procalcitonin:4,lacticidven:4) )No results found for this or any previous visit (from the past 240 hour(s)).   Radiological Exams on Admission: DG Chest 2 View  Result Date: 08/01/2019 CLINICAL DATA:  Hypoxia with shortness of breath EXAM: CHEST - 2 VIEW COMPARISON:  April 02, 2019 FINDINGS: There is cardiomegaly with pulmonary venous hypertension. There is interstitial thickening which may be indicative of a degree of interstitial edema. No appreciable airspace opacity or pleural effusion. No adenopathy. No bone lesions. IMPRESSION: Cardiomegaly with pulmonary vascular congestion. Suspect mild interstitial edema without airspace consolidation. No adenopathy. Electronically Signed   By: Lowella Grip III M.D.   On: 08/01/2019 15:14     EKG: Independently reviewed. Sinus rhythm.   Assessment/Plan   1. Acute on chronic diastolic CHF  - Presents with progressive SOB despite recent antibiotics and albuterol, also notes increased bilateral leg swelling, and has evidence for CHF on CXR  - EF was 55-60% in January 2021  - Treated in ED with Lasix 80 mg IV  - Continue diuresis with Lasix 60 mg IV q12h, follow daily wt and I/Os, continue beta-blocker    2. Interstitial lung disease; chronic hypoxic respiratory failure  - Follows with pulmonology, had been planned to start mycophenolate but there was concern regarding her low platelets   3. Hypertension  - BP at goal, continue metoprolol    4. Insulin-dependent DM  -  A1c was 8.4% in December 2020  - Continue CBG checks and insulin    5. Thrombocytopenia  - Platelets 67k on admission without bleeding  - Monitor, continue hematology follow-up after discharge    DVT prophylaxis: SCDs Code Status: Full  Family Communication: Discussed with patient  Disposition Plan:  Patient is from: Home  Anticipated d/c is to: Home  Anticipated d/c date is: 08/03/19 Patient currently: Dyspneic with minimal exertion and requires ongoing diuresis  Consults called: None  Admission status: Observation     Vianne Bulls, MD [facility] Pager: See [email or web address]  If 7AM-7PM, please contact the daytime attending [email or web address]  08/02/2019, 2:33 AM

## 2019-08-02 NOTE — ED Notes (Signed)
Pt arrives to room on humidified supplemental O2 at 3L/min upon this RN assuming care.

## 2019-08-02 NOTE — ED Notes (Signed)
Pulse ox with amb:  O2 drops to 80% on RA. HR 70bpm  3L [place] at rest 92%, drops down to 85% with ambulation. HR 90.

## 2019-08-03 ENCOUNTER — Inpatient Hospital Stay (HOSPITAL_COMMUNITY): Payer: Medicare Other

## 2019-08-03 DIAGNOSIS — J9621 Acute and chronic respiratory failure with hypoxia: Secondary | ICD-10-CM | POA: Diagnosis not present

## 2019-08-03 DIAGNOSIS — R609 Edema, unspecified: Secondary | ICD-10-CM

## 2019-08-03 DIAGNOSIS — R0602 Shortness of breath: Secondary | ICD-10-CM | POA: Diagnosis not present

## 2019-08-03 DIAGNOSIS — E119 Type 2 diabetes mellitus without complications: Secondary | ICD-10-CM | POA: Diagnosis not present

## 2019-08-03 DIAGNOSIS — J849 Interstitial pulmonary disease, unspecified: Secondary | ICD-10-CM | POA: Diagnosis not present

## 2019-08-03 DIAGNOSIS — I5033 Acute on chronic diastolic (congestive) heart failure: Secondary | ICD-10-CM | POA: Diagnosis not present

## 2019-08-03 LAB — CBC WITH DIFFERENTIAL/PLATELET
Abs Immature Granulocytes: 0.05 10*3/uL (ref 0.00–0.07)
Basophils Absolute: 0 10*3/uL (ref 0.0–0.1)
Basophils Relative: 0 %
Eosinophils Absolute: 0 10*3/uL (ref 0.0–0.5)
Eosinophils Relative: 0 %
HCT: 43.9 % (ref 36.0–46.0)
Hemoglobin: 13.7 g/dL (ref 12.0–15.0)
Immature Granulocytes: 1 %
Lymphocytes Relative: 55 %
Lymphs Abs: 3 10*3/uL (ref 0.7–4.0)
MCH: 31.7 pg (ref 26.0–34.0)
MCHC: 31.2 g/dL (ref 30.0–36.0)
MCV: 101.6 fL — ABNORMAL HIGH (ref 80.0–100.0)
Monocytes Absolute: 1.3 10*3/uL — ABNORMAL HIGH (ref 0.1–1.0)
Monocytes Relative: 23 %
Neutro Abs: 1.1 10*3/uL — ABNORMAL LOW (ref 1.7–7.7)
Neutrophils Relative %: 21 %
Platelets: 61 10*3/uL — ABNORMAL LOW (ref 150–400)
RBC: 4.32 MIL/uL (ref 3.87–5.11)
RDW: 20.4 % — ABNORMAL HIGH (ref 11.5–15.5)
WBC: 5.4 10*3/uL (ref 4.0–10.5)
nRBC: 2 % — ABNORMAL HIGH (ref 0.0–0.2)

## 2019-08-03 LAB — BASIC METABOLIC PANEL
Anion gap: 10 (ref 5–15)
BUN: 15 mg/dL (ref 8–23)
CO2: 28 mmol/L (ref 22–32)
Calcium: 9.4 mg/dL (ref 8.9–10.3)
Chloride: 100 mmol/L (ref 98–111)
Creatinine, Ser: 0.95 mg/dL (ref 0.44–1.00)
GFR calc Af Amer: >60 mL/min (ref >60)
GFR calc non Af Amer: >60 mL/min (ref >60)
Glucose, Bld: 196 mg/dL — ABNORMAL HIGH (ref 70–99)
Potassium: 4.8 mmol/L (ref 3.5–5.1)
Sodium: 138 mmol/L (ref 135–145)

## 2019-08-03 LAB — HEPATIC FUNCTION PANEL
ALT: 16 U/L (ref 0–44)
AST: 19 U/L (ref 15–41)
Albumin: 3.7 g/dL (ref 3.5–5.0)
Alkaline Phosphatase: 57 U/L (ref 38–126)
Bilirubin, Direct: 0.3 mg/dL — ABNORMAL HIGH (ref 0.0–0.2)
Indirect Bilirubin: 1 mg/dL — ABNORMAL HIGH (ref 0.3–0.9)
Total Bilirubin: 1.3 mg/dL — ABNORMAL HIGH (ref 0.3–1.2)
Total Protein: 6.3 g/dL — ABNORMAL LOW (ref 6.5–8.1)

## 2019-08-03 LAB — GLUCOSE, CAPILLARY
Glucose-Capillary: 167 mg/dL — ABNORMAL HIGH (ref 70–99)
Glucose-Capillary: 199 mg/dL — ABNORMAL HIGH (ref 70–99)
Glucose-Capillary: 175 mg/dL — ABNORMAL HIGH (ref 70–99)
Glucose-Capillary: 171 mg/dL — ABNORMAL HIGH (ref 70–99)

## 2019-08-03 LAB — MAGNESIUM: Magnesium: 1.6 mg/dL — ABNORMAL LOW (ref 1.7–2.4)

## 2019-08-03 MED ORDER — MAGNESIUM SULFATE 2 GM/50ML IV SOLN
2.0000 g | Freq: Once | INTRAVENOUS | Status: AC
  Administered 2019-08-03: 2 g via INTRAVENOUS
  Filled 2019-08-03: qty 50

## 2019-08-03 NOTE — Progress Notes (Addendum)
PROGRESS NOTE  Kathy Irwin GBT:[id number] DOB: 1950/07/04 DOA: 08/01/2019 PCP: Kerin Perna, NP  HPI/Recap of past 24 hours:  She is sitting up in chair, reports feeling better today, but not at baseline yet,  Remain edematous, feeling sob walking to the bathroom. Denies chest pain   Assessment/Plan: Principal Problem:   Acute on chronic diastolic CHF (congestive heart failure) (HCC) Active Problems:   Insulin-requiring or dependent type II diabetes mellitus (HCC)   Thrombocytopenia (HCC)   Interstitial lung disease (HCC)   Essential hypertension   Chronic respiratory failure with hypoxia (HCC)   Acute on chronic respiratory failure with hypoxemia (HCC)   Acute on chronic diastolic CHF exacerbation, acute on chronic hypoxic respiratory failure  EF was 55-60% in January 2021  cxr"Cardiomegaly with pulmonary vascular congestion. Suspect mild interstitial edema without airspace consolidation. No adenopathy." continue IV Lasix. ( report on metolazone 3 times a week and daily Lasix at home)  Bilateral lower extremity edema  venous Doppler negative for  DVT  Hypomagnesemia: replace mag  Thrombocytopenia, currently no sign of bleeding, monitor She followed by Irene Limbo Consult to Dr. Irene Limbo  on Monday  History of hypersensitivity pneumonitis/ILD, report she was on prednisone for a few months this was weaned off in March this year.  Reports she is under evaluation for thyroidectomy, however she is awaiting hematology clearance due to her history of thrombocytopenia.  Hypertension  - BP at goal, continue metoprolol    Insulin-dependent DM  - A1c was 8.4% in December 2020   Class III obesity: Body mass index is 46 kg/m.   DVT Prophylaxis:scd due to thrombocytopenia  Code Status: full  Family Communication: patient   Disposition Plan:    Patient came from:           home                                                                                                Anticipated d/c place: home  Barriers to d/c OR conditions which need to be met to effect a safe d/c: remain volume overloaded    Consultants:  none  Procedures:  none  Antibiotics:  none   Objective: BP 120/76 (BP Location: Right Arm)   Pulse 64   Temp 98.1 F (36.7 C) (Oral)   Resp 20   Ht _0  (1.626 m)   Wt 121.6 kg   SpO2 95%   BMI 46.00 kg/m   Intake/Output Summary (Last 24 hours) at 08/03/2019 1418 Last data filed at 08/03/2019 0300 Gross per 24 hour  Intake 360 ml  Output 800 ml  Net -440 ml   Filed Weights   08/01/19 1417 08/02/19 1500 08/03/19 0413  Weight: 117.9 kg 122.1 kg 121.6 kg    Exam: Patient is examined daily including today on 08/03/2019, exams remain the same as of yesterday except that has changed    General:  NAD  Cardiovascular: RRR  Respiratory: diminished , no wheezing  Abdomen: Soft/ND/NT, positive BS  Musculoskeletal: bilateral lower extremity pitting  Edema  Neuro: alert, oriented   Data Reviewed: Basic Metabolic Panel:  Recent Labs  Lab 08/01/19 1429 08/02/19 1057 08/03/19 0404  NA 139  --  138  K 4.5  --  4.8  CL 104  --  100  CO2 26  --  28  GLUCOSE 153*  --  196*  BUN 7*  --  15  CREATININE 0.70  --  0.95  CALCIUM 9.9  --  9.4  MG  --  1.9 1.6*   Liver Function Tests: Recent Labs  Lab 08/02/19 1057 08/03/19 0404  AST 27 19  ALT 17 16  ALKPHOS 68 57  BILITOT 1.5* 1.3*  PROT 7.2 6.3*  ALBUMIN 3.8 3.7   No results for input(s): LIPASE, AMYLASE in the last 168 hours. No results for input(s): AMMONIA in the last 168 hours. CBC: Recent Labs  Lab 08/01/19 1429 08/02/19 1057 08/03/19 0404  WBC 8.8 6.3 5.4  NEUTROABS  --  2.0 1.1*  HGB 14.6 14.5 13.7  HCT 46.2* 47.2* 43.9  MCV 100.4* 102.4* 101.6*  PLT 67* 69* 61*   Cardiac Enzymes:   No results for input(s): CKTOTAL, CKMB, CKMBINDEX, TROPONINI in the last 168 hours. BNP (last 3 results) Recent Labs    11/15/18 2108 12/23/18 1446  08/01/19 1429  BNP 74.4 21.0 93.4    ProBNP (last 3 results) Recent Labs    03/03/19 1151  PROBNP 70.0    CBG: Recent Labs  Lab 08/02/19 1213 08/02/19 1702 08/02/19 2139 08/03/19 0615 08/03/19 1118  GLUCAP 214* 195* 253* 167* 199*    Recent Results (from the past 240 hour(s))  SARS CORONAVIRUS 2 (TAT 6-24 HRS) Nasopharyngeal Nasopharyngeal Swab     Status: None   Collection Time: 08/02/19  3:00 AM   Specimen: Nasopharyngeal Swab  Result Value Ref Range Status   SARS Coronavirus 2 NEGATIVE NEGATIVE Final    Comment: (NOTE) SARS-CoV-2 target nucleic acids are NOT DETECTED. The SARS-CoV-2 RNA is generally detectable in upper and lower respiratory specimens during the acute phase of infection. Negative results do not preclude SARS-CoV-2 infection, do not rule out co-infections with other pathogens, and should not be used as the sole basis for treatment or other patient management decisions. Negative results must be combined with clinical observations, patient history, and epidemiological information. The expected result is Negative. Fact Sheet for Patients: SugarRoll.be Fact Sheet for Healthcare Providers: https://www.woods-mathews.com/ This test is not yet approved or cleared by the [place] FDA and  has been authorized for detection and/or diagnosis of SARS-CoV-2 by FDA under an Emergency Use Authorization (EUA). This EUA will remain  in effect (meaning this test can be used) for the duration of the COVID-19 declaration under Section 56 4(b)(1) of the Act, 21 U.S.C. section 360bbb-3(b)(1), unless the authorization is terminated or revoked sooner. Performed at [facility], [place] [address] [place], [place] [address]      Studies: VAS [place] LOWER EXTREMITY VENOUS (DVT)  Result Date: 08/03/2019  Lower Venous DVTStudy Indications: SOB, and Edema.  Risk Factors: CHF. Limitations: Body habitus. Comparison Study: No  prior study on file for comparison Performing Technologist: Sharion Dove RVS  Examination Guidelines: A complete evaluation includes B-mode imaging, spectral Doppler, color Doppler, and power Doppler as needed of all accessible portions of each vessel. Bilateral testing is considered an integral part of a complete examination. Limited examinations for reoccurring indications may be performed as noted. The reflux portion of the exam is performed with the patient in reverse Trendelenburg.  +---------+---------------+---------+-----------+----------+--------------+ RIGHT    CompressibilityPhasicitySpontaneityPropertiesThrombus Aging +---------+---------------+---------+-----------+----------+--------------+  CFV      Full           Yes      Yes                                 +---------+---------------+---------+-----------+----------+--------------+ SFJ      Full                                                        +---------+---------------+---------+-----------+----------+--------------+ FV Prox  Full                                                        +---------+---------------+---------+-----------+----------+--------------+ FV Mid   Full                                                        +---------+---------------+---------+-----------+----------+--------------+ FV DistalFull                                                        +---------+---------------+---------+-----------+----------+--------------+ PFV      Full                                                        +---------+---------------+---------+-----------+----------+--------------+ POP      Full           Yes      Yes                                 +---------+---------------+---------+-----------+----------+--------------+ PTV      Full                                                        +---------+---------------+---------+-----------+----------+--------------+ PERO      Full                                                        +---------+---------------+---------+-----------+----------+--------------+   +---------+---------------+---------+-----------+----------+--------------+ LEFT     CompressibilityPhasicitySpontaneityPropertiesThrombus Aging +---------+---------------+---------+-----------+----------+--------------+ CFV      Full           Yes      Yes                                 +---------+---------------+---------+-----------+----------+--------------+  SFJ      Full                                                        +---------+---------------+---------+-----------+----------+--------------+ FV Prox  Full                                                        +---------+---------------+---------+-----------+----------+--------------+ FV Mid   Full                                                        +---------+---------------+---------+-----------+----------+--------------+ FV DistalFull                                                        +---------+---------------+---------+-----------+----------+--------------+ PFV      Full                                                        +---------+---------------+---------+-----------+----------+--------------+ POP      Full           Yes      Yes                                 +---------+---------------+---------+-----------+----------+--------------+ PTV      Full                                                        +---------+---------------+---------+-----------+----------+--------------+ PERO     Full                                                        +---------+---------------+---------+-----------+----------+--------------+     Summary: BILATERAL: - No evidence of deep vein thrombosis seen in the lower extremities, bilaterally.   *See table(s) above for measurements and observations.    Preliminary     Scheduled Meds: .  furosemide  60 mg Intravenous Q12H  . gabapentin  300 mg Oral TID  . insulin aspart  0-9 Units Subcutaneous TID WC  . insulin glargine  20 Units Subcutaneous BID  . loratadine  10 mg Oral Daily  . metoprolol  200 mg Oral Daily  . montelukast  10 mg Oral QHS  . pantoprazole  40 mg Oral Daily  . pravastatin  40 mg Oral q1800  .  sertraline  100 mg Oral Daily  . sodium chloride flush  3 mL Intravenous Q12H    Continuous Infusions: . sodium chloride    . magnesium sulfate bolus IVPB       Time spent: 38mns I have personally reviewed and interpreted on  08/03/2019 daily labs, tele strips, imagings as discussed above under date review session and assessment and plans.  I reviewed all nursing notes, pharmacy notes, consultant notes,  vitals, pertinent old records  I have discussed plan of care as described above with RN , patient  on 08/03/2019   FFlorencia ReasonsMD, PhD, FACP  [facility]  Available via Epic secure chat 7am-7pm for nonurgent issues Please page for urgent issues, pager number available through [email or web address] .   08/03/2019, 2:18 PM  LOS: 1 day

## 2019-08-03 NOTE — Progress Notes (Signed)
VASCULAR LAB PRELIMINARY  PRELIMINARY  PRELIMINARY  PRELIMINARY  Bilateral lower extremity venous duplex completed.    Preliminary report:  See CV proc for preliminary results.  Keny Donald, RVT 08/03/2019, 1:18 PM

## 2019-08-04 DIAGNOSIS — I5033 Acute on chronic diastolic (congestive) heart failure: Secondary | ICD-10-CM | POA: Diagnosis not present

## 2019-08-04 LAB — HEPATIC FUNCTION PANEL
ALT: 15 U/L (ref 0–44)
AST: 20 U/L (ref 15–41)
Albumin: 3.7 g/dL (ref 3.5–5.0)
Alkaline Phosphatase: 59 U/L (ref 38–126)
Bilirubin, Direct: 0.3 mg/dL — ABNORMAL HIGH (ref 0.0–0.2)
Indirect Bilirubin: 1.2 mg/dL — ABNORMAL HIGH (ref 0.3–0.9)
Total Bilirubin: 1.5 mg/dL — ABNORMAL HIGH (ref 0.3–1.2)
Total Protein: 6.5 g/dL (ref 6.5–8.1)

## 2019-08-04 LAB — BASIC METABOLIC PANEL
Anion gap: 11 (ref 5–15)
BUN: 22 mg/dL (ref 8–23)
CO2: 29 mmol/L (ref 22–32)
Calcium: 9.3 mg/dL (ref 8.9–10.3)
Chloride: 99 mmol/L (ref 98–111)
Creatinine, Ser: 0.94 mg/dL (ref 0.44–1.00)
GFR calc Af Amer: >60 mL/min (ref >60)
GFR calc non Af Amer: >60 mL/min (ref >60)
Glucose, Bld: 172 mg/dL — ABNORMAL HIGH (ref 70–99)
Potassium: 3.9 mmol/L (ref 3.5–5.1)
Sodium: 139 mmol/L (ref 135–145)

## 2019-08-04 LAB — CBC WITH DIFFERENTIAL/PLATELET
Abs Immature Granulocytes: 0.02 10*3/uL (ref 0.00–0.07)
Basophils Absolute: 0 10*3/uL (ref 0.0–0.1)
Basophils Relative: 0 %
Eosinophils Absolute: 0 10*3/uL (ref 0.0–0.5)
Eosinophils Relative: 0 %
HCT: 44.9 % (ref 36.0–46.0)
Hemoglobin: 14.1 g/dL (ref 12.0–15.0)
Immature Granulocytes: 1 %
Lymphocytes Relative: 35 %
Lymphs Abs: 1.4 10*3/uL (ref 0.7–4.0)
MCH: 32 pg (ref 26.0–34.0)
MCHC: 31.4 g/dL (ref 30.0–36.0)
MCV: 101.8 fL — ABNORMAL HIGH (ref 80.0–100.0)
Monocytes Absolute: 1 10*3/uL (ref 0.1–1.0)
Monocytes Relative: 26 %
Neutro Abs: 1.6 10*3/uL — ABNORMAL LOW (ref 1.7–7.7)
Neutrophils Relative %: 38 %
Platelets: 46 10*3/uL — ABNORMAL LOW (ref 150–400)
RBC: 4.41 MIL/uL (ref 3.87–5.11)
RDW: 19.9 % — ABNORMAL HIGH (ref 11.5–15.5)
WBC: 4.1 10*3/uL (ref 4.0–10.5)
nRBC: 1 % — ABNORMAL HIGH (ref 0.0–0.2)

## 2019-08-04 LAB — GLUCOSE, CAPILLARY
Glucose-Capillary: 164 mg/dL — ABNORMAL HIGH (ref 70–99)
Glucose-Capillary: 145 mg/dL — ABNORMAL HIGH (ref 70–99)
Glucose-Capillary: 175 mg/dL — ABNORMAL HIGH (ref 70–99)
Glucose-Capillary: 161 mg/dL — ABNORMAL HIGH (ref 70–99)

## 2019-08-04 LAB — MAGNESIUM: Magnesium: 1.9 mg/dL (ref 1.7–2.4)

## 2019-08-04 MED ORDER — DICYCLOMINE HCL 20 MG PO TABS
20.0000 mg | ORAL_TABLET | Freq: Three times a day (TID) | ORAL | Status: DC
  Administered 2019-08-04 – 2019-08-10 (×20): 20 mg via ORAL
  Filled 2019-08-04 (×25): qty 1

## 2019-08-04 MED ORDER — ONDANSETRON HCL 4 MG/2ML IJ SOLN: 4.0000 mg | Freq: Four times a day (QID) | INTRAMUSCULAR | Status: DC | PRN

## 2019-08-04 NOTE — Progress Notes (Signed)
PROGRESS NOTE    Kathy Irwin  HLK:[id number] DOB: 11-30-1950 DOA: 08/01/2019 PCP: Kerin Perna, NP   Brief Narrative: Patient is a 69 year old female with history of interstitial lung disease on oxygen at home, insulin-dependent diabetes mellitus type 2, hypertension, chronic thrombocytopenia, chronic diastolic CHF who presents to the emergency department with complaints of progressive exertional dyspnea, bilateral lower extremity swelling.  On presentation she was saturating low 80s on room air.  Chest x-ray showed vascular congestion, interstitial edema.  Patient was admitted for the management of acute on chronic diastolic CHF exacerbation.  Started on IV Lasix.  Assessment & Plan:   Principal Problem:   Acute on chronic diastolic CHF (congestive heart failure) (HCC) Active Problems:   Insulin-requiring or dependent type II diabetes mellitus (HCC)   Thrombocytopenia (HCC)   Interstitial lung disease (HCC)   Essential hypertension   Chronic respiratory failure with hypoxia (HCC)   Acute on chronic respiratory failure with hypoxemia (HCC)   Acute on chronic diastolic CHF: Presented with exertional dyspnea, bilateral lower extremity swelling.  Chest x-ray on admission showed vascular congestion, interstitial edema.  She is on Lasix, metolazone at home. Echocardiogram done on January 2 121 showed ejection fraction of 60%.  Started on IV Lasix. She still has bilateral lower extremity swelling and she complains of congestion so we will   continue current diuretics for now. Venous Doppler was negative for DVT.  Thrombocytopenia: Chronic.  Follows with Dr. Irene Limbo.  There was suspicion for CML but no evidence of BCR/ABL rearrangement.  We recommend outpatient follow-up with Dr. Irene Limbo.  At the meantime, continue to monitor platelet level.  History of hypersensitivity pneumonitis/ILD: Follows with Dr. Vaughan Browner.  On oxygen at 3 L at home.  Hypertension: Currently blood pressure stable.   Continue current regimen  Insulin-dependent diabetes mellitus: Hemoglobin A1c of 8.4% as per December 2020.  Continue current regimen.  Morbid obesity: BMI 45.4.         DVT prophylaxis:SCD Code Status: Full Family Communication: None present at the bedside Status is: Inpatient  Remains inpatient appropriate because:IV treatments appropriate due to intensity of illness or inability to take PO   Dispo: The patient is from: Home              Anticipated d/c is to: Home              Anticipated d/c date is: 1 day              Patient currently is not medically stable to d/c.       Consultants: None  Procedures: None  Antimicrobials:  Anti-infectives (From admission, onward)   None      Subjective: Patient seen and examined at the bedside this morning.  Hemodynamically stable.  Sitting in the chair.  Complains of nausea and diarrhea today.  Complains of abdominal cramps.  She is on on 3 L of oxygen per minute.  Objective: Vitals:   08/03/19 1621 08/03/19 2226 08/04/19 0136 08/04/19 0811  BP: 125/81 123/60 (!) 103/58 120/70  Pulse: 61 87 69 67  Resp: _0 Temp: 98.4 F (36.9 C) 97.6 F (36.4 C) 98.3 F (36.8 C) 98.8 F (37.1 C)  TempSrc: Oral Oral Oral Oral  SpO2: 96% 95% 100% 100%  Weight:   120.2 kg   Height:        Intake/Output Summary (Last 24 hours) at 08/04/2019 1153 Last data filed at 08/04/2019 0909 Gross per 24 hour  Intake 962.44  ml  Output 400 ml  Net 562.44 ml   Filed Weights   08/02/19 1500 08/03/19 0413 08/04/19 0136  Weight: 122.1 kg 121.6 kg 120.2 kg    Examination:  General exam: Morbidly obese, not in distress  HEENT:PERRL,Oral mucosa moist, Ear/Nose normal on gross exam Respiratory system: no wheezes or crackles  Cardiovascular system: S1 & S2 heard, RRR. No JVD, murmurs, rubs, gallops or clicks. Gastrointestinal system: Abdomen is distended, soft and nontender. No organomegaly or masses felt. Normal bowel sounds  heard. Central nervous system: Alert and oriented. Extremities: 1-2+ bilateral lower extremity edema, no clubbing ,no cyanosis Skin: No rashes, lesions or ulcers,no icterus ,no pallor   Data Reviewed: I have personally reviewed following labs and imaging studies  CBC: Recent Labs  Lab 08/01/19 1429 08/02/19 1057 08/03/19 0404 08/04/19 0513  WBC 8.8 6.3 5.4 4.1  NEUTROABS  --  2.0 1.1* 1.6*  HGB 14.6 14.5 13.7 14.1  HCT 46.2* 47.2* 43.9 44.9  MCV 100.4* 102.4* 101.6* 101.8*  PLT 67* 69* 61* 46*   Basic Metabolic Panel: Recent Labs  Lab 08/01/19 1429 08/02/19 1057 08/03/19 0404 08/04/19 0513  NA 139  --  138 139  K 4.5  --  4.8 3.9  CL 104  --  100 99  CO2 26  --  28 29  GLUCOSE 153*  --  196* 172*  BUN 7*  --  15 22  CREATININE 0.70  --  0.95 0.94  CALCIUM 9.9  --  9.4 9.3  MG  --  1.9 1.6* 1.9   GFR: Estimated Creatinine Clearance: 72.1 mL/min (by C-G formula based on SCr of 0.94 mg/dL). Liver Function Tests: Recent Labs  Lab 08/02/19 1057 08/03/19 0404 08/04/19 0513  AST _0 ALT _1 ALKPHOS 68 57 59  BILITOT 1.5* 1.3* 1.5*  PROT 7.2 6.3* 6.5  ALBUMIN 3.8 3.7 3.7   No results for input(s): LIPASE, AMYLASE in the last 168 hours. No results for input(s): AMMONIA in the last 168 hours. Coagulation Profile: No results for input(s): INR, PROTIME in the last 168 hours. Cardiac Enzymes: No results for input(s): CKTOTAL, CKMB, CKMBINDEX, TROPONINI in the last 168 hours. BNP (last 3 results) Recent Labs    03/03/19 1151  PROBNP 70.0   HbA1C: Recent Labs    08/02/19 1057  HGBA1C 8.0*   CBG: Recent Labs  Lab 08/03/19 0615 08/03/19 1118 08/03/19 1619 08/03/19 2122 08/04/19 0528  GLUCAP 167* 199* 175* 171* 164*   Lipid Profile: No results for input(s): CHOL, HDL, LDLCALC, TRIG, CHOLHDL, LDLDIRECT in the last 72 hours. Thyroid Function Tests: No results for input(s): TSH, T4TOTAL, FREET4, T3FREE, THYROIDAB in the last 72  hours. Anemia Panel: No results for input(s): VITAMINB12, FOLATE, FERRITIN, TIBC, IRON, RETICCTPCT in the last 72 hours. Sepsis Labs: No results for input(s): PROCALCITON, LATICACIDVEN in the last 168 hours.  Recent Results (from the past 240 hour(s))  SARS CORONAVIRUS 2 (TAT 6-24 HRS) Nasopharyngeal Nasopharyngeal Swab     Status: None   Collection Time: 08/02/19  3:00 AM   Specimen: Nasopharyngeal Swab  Result Value Ref Range Status   SARS Coronavirus 2 NEGATIVE NEGATIVE Final    Comment: (NOTE) SARS-CoV-2 target nucleic acids are NOT DETECTED. The SARS-CoV-2 RNA is generally detectable in upper and lower respiratory specimens during the acute phase of infection. Negative results do not preclude SARS-CoV-2 infection, do not rule out co-infections with other pathogens, and should not be used as the  sole basis for treatment or other patient management decisions. Negative results must be combined with clinical observations, patient history, and epidemiological information. The expected result is Negative. Fact Sheet for Patients: SugarRoll.be Fact Sheet for Healthcare Providers: https://www.woods-mathews.com/ This test is not yet approved or cleared by the [place] FDA and  has been authorized for detection and/or diagnosis of SARS-CoV-2 by FDA under an Emergency Use Authorization (EUA). This EUA will remain  in effect (meaning this test can be used) for the duration of the COVID-19 declaration under Section 56 4(b)(1) of the Act, 21 U.S.C. section 360bbb-3(b)(1), unless the authorization is terminated or revoked sooner. Performed at [facility], [place] [address], [place], [place] [address]          Radiology Studies: VAS [place] LOWER EXTREMITY VENOUS (DVT)  Result Date: 08/03/2019  Lower Venous DVTStudy Indications: SOB, and Edema.  Risk Factors: CHF. Limitations: Body habitus. Comparison Study: No prior study on file for  comparison Performing Technologist: Sharion Dove RVS  Examination Guidelines: A complete evaluation includes B-mode imaging, spectral Doppler, color Doppler, and power Doppler as needed of all accessible portions of each vessel. Bilateral testing is considered an integral part of a complete examination. Limited examinations for reoccurring indications may be performed as noted. The reflux portion of the exam is performed with the patient in reverse Trendelenburg.  +---------+---------------+---------+-----------+----------+--------------+ RIGHT    CompressibilityPhasicitySpontaneityPropertiesThrombus Aging +---------+---------------+---------+-----------+----------+--------------+ CFV      Full           Yes      Yes                                 +---------+---------------+---------+-----------+----------+--------------+ SFJ      Full                                                        +---------+---------------+---------+-----------+----------+--------------+ FV Prox  Full                                                        +---------+---------------+---------+-----------+----------+--------------+ FV Mid   Full                                                        +---------+---------------+---------+-----------+----------+--------------+ FV DistalFull                                                        +---------+---------------+---------+-----------+----------+--------------+ PFV      Full                                                        +---------+---------------+---------+-----------+----------+--------------+  POP      Full           Yes      Yes                                 +---------+---------------+---------+-----------+----------+--------------+ PTV      Full                                                        +---------+---------------+---------+-----------+----------+--------------+ PERO     Full                                                         +---------+---------------+---------+-----------+----------+--------------+   +---------+---------------+---------+-----------+----------+--------------+ LEFT     CompressibilityPhasicitySpontaneityPropertiesThrombus Aging +---------+---------------+---------+-----------+----------+--------------+ CFV      Full           Yes      Yes                                 +---------+---------------+---------+-----------+----------+--------------+ SFJ      Full                                                        +---------+---------------+---------+-----------+----------+--------------+ FV Prox  Full                                                        +---------+---------------+---------+-----------+----------+--------------+ FV Mid   Full                                                        +---------+---------------+---------+-----------+----------+--------------+ FV DistalFull                                                        +---------+---------------+---------+-----------+----------+--------------+ PFV      Full                                                        +---------+---------------+---------+-----------+----------+--------------+ POP      Full           Yes      Yes                                 +---------+---------------+---------+-----------+----------+--------------+  PTV      Full                                                        +---------+---------------+---------+-----------+----------+--------------+ PERO     Full                                                        +---------+---------------+---------+-----------+----------+--------------+     Summary: BILATERAL: - No evidence of deep vein thrombosis seen in the lower extremities, bilaterally.   *See table(s) above for measurements and observations. Electronically signed by Monica Martinez MD on 08/03/2019 at 3:07:53  PM.    Final         Scheduled Meds: . furosemide  60 mg Intravenous Q12H  . gabapentin  300 mg Oral TID  . insulin aspart  0-9 Units Subcutaneous TID WC  . insulin glargine  20 Units Subcutaneous BID  . loratadine  10 mg Oral Daily  . metoprolol  200 mg Oral Daily  . montelukast  10 mg Oral QHS  . pantoprazole  40 mg Oral Daily  . pravastatin  40 mg Oral q1800  . sertraline  100 mg Oral Daily  . sodium chloride flush  3 mL Intravenous Q12H   Continuous Infusions: . sodium chloride       LOS: 2 days    Time spent: 25 mins.More than 50% of that time was spent in counseling and/or coordination of care.      Shelly Coss, MD [facility] P4/19/2021, 11:53 AM

## 2019-08-04 NOTE — Plan of Care (Signed)
  Problem: Education: Goal: Knowledge of General Education information will improve Description: Including pain rating scale, medication(s)/side effects and non-pharmacologic comfort measures Outcome: Progressing  Patient verbalizing rationale for lasix.  Problem: Clinical Measurements: Goal: Will remain free from infection Outcome: Progressing  Patient afebrile at this time.  Problem: Activity: Goal: Risk for activity intolerance will decrease Outcome: Progressing  Patient up to bathroom without c/o SOB.  Problem: Nutrition: Goal: Adequate nutrition will be maintained Outcome: Progressing  Patient requiring zofran to tolerate intakes today. Bentyl ordered.

## 2019-08-05 ENCOUNTER — Other Ambulatory Visit (HOSPITAL_COMMUNITY): Payer: Medicare Other

## 2019-08-05 ENCOUNTER — Other Ambulatory Visit: Payer: Self-pay | Admitting: Hematology

## 2019-08-05 DIAGNOSIS — I5033 Acute on chronic diastolic (congestive) heart failure: Secondary | ICD-10-CM | POA: Diagnosis not present

## 2019-08-05 LAB — CBC WITH DIFFERENTIAL/PLATELET
Abs Immature Granulocytes: 0.02 10*3/uL (ref 0.00–0.07)
Basophils Absolute: 0 10*3/uL (ref 0.0–0.1)
Basophils Relative: 0 %
Eosinophils Absolute: 0 10*3/uL (ref 0.0–0.5)
Eosinophils Relative: 0 %
HCT: 43 % (ref 36.0–46.0)
Hemoglobin: 13.5 g/dL (ref 12.0–15.0)
Immature Granulocytes: 0 %
Lymphocytes Relative: 38 %
Lymphs Abs: 2.4 10*3/uL (ref 0.7–4.0)
MCH: 31.9 pg (ref 26.0–34.0)
MCHC: 31.4 g/dL (ref 30.0–36.0)
MCV: 101.7 fL — ABNORMAL HIGH (ref 80.0–100.0)
Monocytes Absolute: 1.9 10*3/uL — ABNORMAL HIGH (ref 0.1–1.0)
Monocytes Relative: 31 %
Neutro Abs: 1.9 10*3/uL (ref 1.7–7.7)
Neutrophils Relative %: 31 %
Platelets: 43 10*3/uL — ABNORMAL LOW (ref 150–400)
RBC: 4.23 MIL/uL (ref 3.87–5.11)
RDW: 19.4 % — ABNORMAL HIGH (ref 11.5–15.5)
WBC: 6.2 10*3/uL (ref 4.0–10.5)
nRBC: 0.3 % — ABNORMAL HIGH (ref 0.0–0.2)

## 2019-08-05 LAB — BASIC METABOLIC PANEL
Anion gap: 10 (ref 5–15)
BUN: 21 mg/dL (ref 8–23)
CO2: 29 mmol/L (ref 22–32)
Calcium: 8.8 mg/dL — ABNORMAL LOW (ref 8.9–10.3)
Chloride: 99 mmol/L (ref 98–111)
Creatinine, Ser: 0.95 mg/dL (ref 0.44–1.00)
GFR calc Af Amer: >60 mL/min (ref >60)
GFR calc non Af Amer: >60 mL/min (ref >60)
Glucose, Bld: 128 mg/dL — ABNORMAL HIGH (ref 70–99)
Potassium: 3.5 mmol/L (ref 3.5–5.1)
Sodium: 138 mmol/L (ref 135–145)

## 2019-08-05 LAB — GLUCOSE, CAPILLARY
Glucose-Capillary: 124 mg/dL — ABNORMAL HIGH (ref 70–99)
Glucose-Capillary: 181 mg/dL — ABNORMAL HIGH (ref 70–99)
Glucose-Capillary: 173 mg/dL — ABNORMAL HIGH (ref 70–99)
Glucose-Capillary: 197 mg/dL — ABNORMAL HIGH (ref 70–99)

## 2019-08-05 MED ORDER — GUAIFENESIN ER 600 MG PO TB12
600.0000 mg | ORAL_TABLET | Freq: Two times a day (BID) | ORAL | Status: DC
  Administered 2019-08-05 (×2): 600 mg via ORAL
  Filled 2019-08-05 (×3): qty 1

## 2019-08-05 NOTE — Care Management Important Message (Signed)
Important Message  Patient Details  Name: Draya Felker MRN: [id number] Date of Birth: 1950-10-02   Medicare Important Message Given:  Yes     Shelda Altes 08/05/2019, 10:54 AM

## 2019-08-05 NOTE — Progress Notes (Addendum)
PROGRESS NOTE    Kathy Irwin  QMV:[id number] DOB: 04-11-1951 DOA: 08/01/2019 PCP: Kerin Perna, NP   Brief Narrative: Patient is a 69 year old female with history of interstitial lung disease on oxygen at home, insulin-dependent diabetes mellitus type 2, hypertension, chronic thrombocytopenia, chronic diastolic CHF who presents to the emergency department with complaints of progressive exertional dyspnea, bilateral lower extremity swelling.  On presentation she was saturating low 80s on room air.  Chest x-ray showed vascular congestion, interstitial edema.  Patient was admitted for the management of acute on chronic diastolic CHF exacerbation.  Started on IV Lasix.  Assessment & Plan:   Principal Problem:   Acute on chronic diastolic CHF (congestive heart failure) (HCC) Active Problems:   Insulin-requiring or dependent type II diabetes mellitus (HCC)   Thrombocytopenia (HCC)   Interstitial lung disease (HCC)   Essential hypertension   Chronic respiratory failure with hypoxia (HCC)   Acute on chronic respiratory failure with hypoxemia (HCC)   Acute on chronic diastolic CHF: Presented with exertional dyspnea, bilateral lower extremity swelling.  Chest x-ray on admission showed vascular congestion, interstitial edema.  She is on Lasix, metolazone at home. Echocardiogram done on 04/2019 showed ejection fraction of 60%.  We will get a new echocardiogram today.  Started on IV Lasix 60 mg twice a day.  She takes 40 mg of Lasix daily at home.  We need to increase the dose of Lasix on discharge. She still has bilateral lower extremity swelling  but feels much better today.  We will continue current dose of Lasix with plan to change to oral tomorrow and discharge to home. Venous Doppler was negative for DVT.  Thrombocytopenia: Chronic.  Follows with Dr. Irene Limbo.  There was suspicion for CML but no evidence of BCR/ABL rearrangement as per previous records.  I discussed with Dr. Irene Limbo today on  phone and he recommended recommend outpatient follow-up .  At the meantime, continue to monitor platelet level.   History of hypersensitivity pneumonitis/ILD: Follows with Dr. Vaughan Browner.  On oxygen at 3 L at home.  Currently she is on 3 L of oxygen per minute.  Hypertension: Currently blood pressure stable.  Continue current regimen  Insulin-dependent diabetes mellitus: Hemoglobin A1c of 8.4% as per December 2020.  Continue current regimen.  Morbid obesity: BMI 45.4.         DVT prophylaxis:SCD Code Status: Full Family Communication: daughter present at the bedside Status is: Inpatient  Remains inpatient appropriate because:IV treatments appropriate due to intensity of illness or inability to take PO   Dispo: The patient is from: Home              Anticipated d/c is to: Home              Anticipated d/c date is: 1 day              Patient currently is not medically stable to d/c.  Still has significant edema needing IV Lasix.       Consultants: None  Procedures: None  Antimicrobials:  Anti-infectives (From admission, onward)   None      Subjective: Patient seen and examined at the bedside this morning.  Sitting on the chair.  Looks better today.  She feels much better.Complians of some cough with phlegm.  Currently on 3 days of oxygen per minute.  Still has significant bilateral lower extremity edema necessitating IV Lasix.  Objective: Vitals:   08/04/19 1610 08/04/19 2028 08/04/19 2232 08/05/19 0352  BP: 109/66 Marland Kitchen)  124/57 (!) 111/57 130/83  Pulse: 77 86  72  Resp: _0 Temp: 98.7 F (37.1 C) 98.7 F (37.1 C)  98.4 F (36.9 C)  TempSrc: Oral Oral  Oral  SpO2: 98% 100%  100%  Weight:    119.9 kg  Height:        Intake/Output Summary (Last 24 hours) at 08/05/2019 0805 Last data filed at 08/05/2019 0757 Gross per 24 hour  Intake 960 ml  Output 1175 ml  Net -215 ml   Filed Weights   08/03/19 0413 08/04/19 0136 08/05/19 0352  Weight: 121.6 kg 120.2  kg 119.9 kg    Examination:  General exam: Comfortable, obese Respiratory system:  no wheezes or crackles  Cardiovascular system: S1 & S2 heard, RRR. No JVD, murmurs, rubs, gallops or clicks. Gastrointestinal system: Abdomen is nondistended, soft and nontender. No organomegaly or masses felt. Normal bowel sounds heard. Central nervous system: Alert and oriented. No focal neurological deficits. Extremities: 1-2+ bilateral lower extremity edema, no clubbing ,no cyanosis, distal peripheral pulses palpable. Skin: No rashes, lesions or ulcers,no icterus ,no pallor   Data Reviewed: I have personally reviewed following labs and imaging studies  CBC: Recent Labs  Lab 08/01/19 1429 08/02/19 1057 08/03/19 0404 08/04/19 0513 08/05/19 0514  WBC 8.8 6.3 5.4 4.1 6.2  NEUTROABS  --  2.0 1.1* 1.6* 1.9  HGB 14.6 14.5 13.7 14.1 13.5  HCT 46.2* 47.2* 43.9 44.9 43.0  MCV 100.4* 102.4* 101.6* 101.8* 101.7*  PLT 67* 69* 61* 46* 43*   Basic Metabolic Panel: Recent Labs  Lab 08/01/19 1429 08/02/19 1057 08/03/19 0404 08/04/19 0513 08/05/19 0514  NA 139  --  138 139 138  K 4.5  --  4.8 3.9 3.5  CL 104  --  100 99 99  CO2 26  --  _1 GLUCOSE 153*  --  196* 172* 128*  BUN 7*  --  _2 CREATININE 0.70  --  0.95 0.94 0.95  CALCIUM 9.9  --  9.4 9.3 8.8*  MG  --  1.9 1.6* 1.9  --    GFR: Estimated Creatinine Clearance: 71.3 mL/min (by C-G formula based on SCr of 0.95 mg/dL). Liver Function Tests: Recent Labs  Lab 08/02/19 1057 08/03/19 0404 08/04/19 0513  AST _3 ALT _4 ALKPHOS 68 57 59  BILITOT 1.5* 1.3* 1.5*  PROT 7.2 6.3* 6.5  ALBUMIN 3.8 3.7 3.7   No results for input(s): LIPASE, AMYLASE in the last 168 hours. No results for input(s): AMMONIA in the last 168 hours. Coagulation Profile: No results for input(s): INR, PROTIME in the last 168 hours. Cardiac Enzymes: No results for input(s): CKTOTAL, CKMB, CKMBINDEX, TROPONINI in the last 168 hours. BNP  (last 3 results) Recent Labs    03/03/19 1151  PROBNP 70.0   HbA1C: Recent Labs    08/02/19 1057  HGBA1C 8.0*   CBG: Recent Labs  Lab 08/04/19 0528 08/04/19 1229 08/04/19 1612 08/04/19 2139 08/05/19 0605  GLUCAP 164* 145* 175* 161* 124*   Lipid Profile: No results for input(s): CHOL, HDL, LDLCALC, TRIG, CHOLHDL, LDLDIRECT in the last 72 hours. Thyroid Function Tests: No results for input(s): TSH, T4TOTAL, FREET4, T3FREE, THYROIDAB in the last 72 hours. Anemia Panel: No results for input(s): VITAMINB12, FOLATE, FERRITIN, TIBC, IRON, RETICCTPCT in the last 72 hours. Sepsis Labs: No results for input(s): PROCALCITON, LATICACIDVEN in the last 168 hours.  Recent Results (from the  past 240 hour(s))  SARS CORONAVIRUS 2 (TAT 6-24 HRS) Nasopharyngeal Nasopharyngeal Swab     Status: None   Collection Time: 08/02/19  3:00 AM   Specimen: Nasopharyngeal Swab  Result Value Ref Range Status   SARS Coronavirus 2 NEGATIVE NEGATIVE Final    Comment: (NOTE) SARS-CoV-2 target nucleic acids are NOT DETECTED. The SARS-CoV-2 RNA is generally detectable in upper and lower respiratory specimens during the acute phase of infection. Negative results do not preclude SARS-CoV-2 infection, do not rule out co-infections with other pathogens, and should not be used as the sole basis for treatment or other patient management decisions. Negative results must be combined with clinical observations, patient history, and epidemiological information. The expected result is Negative. Fact Sheet for Patients: SugarRoll.be Fact Sheet for Healthcare Providers: https://www.woods-mathews.com/ This test is not yet approved or cleared by the [place] FDA and  has been authorized for detection and/or diagnosis of SARS-CoV-2 by FDA under an Emergency Use Authorization (EUA). This EUA will remain  in effect (meaning this test can be used) for the duration of  the COVID-19 declaration under Section 56 4(b)(1) of the Act, 21 U.S.C. section 360bbb-3(b)(1), unless the authorization is terminated or revoked sooner. Performed at [facility], [place] [address], [place], [place] [address]          Radiology Studies: VAS [place] LOWER EXTREMITY VENOUS (DVT)  Result Date: 08/03/2019  Lower Venous DVTStudy Indications: SOB, and Edema.  Risk Factors: CHF. Limitations: Body habitus. Comparison Study: No prior study on file for comparison Performing Technologist: Sharion Dove RVS  Examination Guidelines: A complete evaluation includes B-mode imaging, spectral Doppler, color Doppler, and power Doppler as needed of all accessible portions of each vessel. Bilateral testing is considered an integral part of a complete examination. Limited examinations for reoccurring indications may be performed as noted. The reflux portion of the exam is performed with the patient in reverse Trendelenburg.  +---------+---------------+---------+-----------+----------+--------------+ RIGHT    CompressibilityPhasicitySpontaneityPropertiesThrombus Aging +---------+---------------+---------+-----------+----------+--------------+ CFV      Full           Yes      Yes                                 +---------+---------------+---------+-----------+----------+--------------+ SFJ      Full                                                        +---------+---------------+---------+-----------+----------+--------------+ FV Prox  Full                                                        +---------+---------------+---------+-----------+----------+--------------+ FV Mid   Full                                                        +---------+---------------+---------+-----------+----------+--------------+ FV DistalFull                                                        +---------+---------------+---------+-----------+----------+--------------+  PFV       Full                                                        +---------+---------------+---------+-----------+----------+--------------+ POP      Full           Yes      Yes                                 +---------+---------------+---------+-----------+----------+--------------+ PTV      Full                                                        +---------+---------------+---------+-----------+----------+--------------+ PERO     Full                                                        +---------+---------------+---------+-----------+----------+--------------+   +---------+---------------+---------+-----------+----------+--------------+ LEFT     CompressibilityPhasicitySpontaneityPropertiesThrombus Aging +---------+---------------+---------+-----------+----------+--------------+ CFV      Full           Yes      Yes                                 +---------+---------------+---------+-----------+----------+--------------+ SFJ      Full                                                        +---------+---------------+---------+-----------+----------+--------------+ FV Prox  Full                                                        +---------+---------------+---------+-----------+----------+--------------+ FV Mid   Full                                                        +---------+---------------+---------+-----------+----------+--------------+ FV DistalFull                                                        +---------+---------------+---------+-----------+----------+--------------+ PFV      Full                                                        +---------+---------------+---------+-----------+----------+--------------+  POP      Full           Yes      Yes                                 +---------+---------------+---------+-----------+----------+--------------+ PTV      Full                                                         +---------+---------------+---------+-----------+----------+--------------+ PERO     Full                                                        +---------+---------------+---------+-----------+----------+--------------+     Summary: BILATERAL: - No evidence of deep vein thrombosis seen in the lower extremities, bilaterally.   *See table(s) above for measurements and observations. Electronically signed by Monica Martinez MD on 08/03/2019 at 3:07:53 PM.    Final         Scheduled Meds: . dicyclomine  20 mg Oral TID AC & HS  . furosemide  60 mg Intravenous Q12H  . gabapentin  300 mg Oral TID  . insulin aspart  0-9 Units Subcutaneous TID WC  . insulin glargine  20 Units Subcutaneous BID  . loratadine  10 mg Oral Daily  . metoprolol  200 mg Oral Daily  . montelukast  10 mg Oral QHS  . pantoprazole  40 mg Oral Daily  . pravastatin  40 mg Oral q1800  . sertraline  100 mg Oral Daily  . sodium chloride flush  3 mL Intravenous Q12H   Continuous Infusions: . sodium chloride       LOS: 3 days    Time spent: 25 mins.More than 50% of that time was spent in counseling and/or coordination of care.      Shelly Coss, MD [facility] P4/20/2021, 8:05 AM

## 2019-08-06 ENCOUNTER — Ambulatory Visit: Payer: Medicare Other | Admitting: Pulmonary Disease

## 2019-08-06 ENCOUNTER — Telehealth: Payer: Self-pay | Admitting: Hematology

## 2019-08-06 ENCOUNTER — Inpatient Hospital Stay (HOSPITAL_COMMUNITY): Payer: Medicare Other

## 2019-08-06 DIAGNOSIS — I5033 Acute on chronic diastolic (congestive) heart failure: Secondary | ICD-10-CM

## 2019-08-06 LAB — BASIC METABOLIC PANEL
Anion gap: 13 (ref 5–15)
BUN: 13 mg/dL (ref 8–23)
CO2: 30 mmol/L (ref 22–32)
Calcium: 9.5 mg/dL (ref 8.9–10.3)
Chloride: 95 mmol/L — ABNORMAL LOW (ref 98–111)
Creatinine, Ser: 0.84 mg/dL (ref 0.44–1.00)
GFR calc Af Amer: >60 mL/min (ref >60)
GFR calc non Af Amer: >60 mL/min (ref >60)
Glucose, Bld: 170 mg/dL — ABNORMAL HIGH (ref 70–99)
Potassium: 3.6 mmol/L (ref 3.5–5.1)
Sodium: 138 mmol/L (ref 135–145)

## 2019-08-06 LAB — RESPIRATORY PANEL BY PCR

## 2019-08-06 LAB — ECHOCARDIOGRAM COMPLETE
Height: 64 in
Weight: 4233.6 oz

## 2019-08-06 LAB — MAGNESIUM: Magnesium: 1.6 mg/dL — ABNORMAL LOW (ref 1.7–2.4)

## 2019-08-06 LAB — GLUCOSE, CAPILLARY
Glucose-Capillary: 215 mg/dL — ABNORMAL HIGH (ref 70–99)
Glucose-Capillary: 184 mg/dL — ABNORMAL HIGH (ref 70–99)
Glucose-Capillary: 163 mg/dL — ABNORMAL HIGH (ref 70–99)
Glucose-Capillary: 180 mg/dL — ABNORMAL HIGH (ref 70–99)

## 2019-08-06 MED ORDER — MAGNESIUM SULFATE 2 GM/50ML IV SOLN
2.0000 g | Freq: Once | INTRAVENOUS | Status: AC
  Administered 2019-08-06: 2 g via INTRAVENOUS
  Filled 2019-08-06: qty 50

## 2019-08-06 MED ORDER — GUAIFENESIN-DM 100-10 MG/5ML PO SYRP
5.0000 mL | ORAL_SOLUTION | ORAL | Status: DC | PRN
  Administered 2019-08-06 – 2019-08-11 (×10): 5 mL via ORAL
  Filled 2019-08-06 (×10): qty 5

## 2019-08-06 MED ORDER — POTASSIUM CHLORIDE CRYS ER 20 MEQ PO TBCR
30.0000 meq | EXTENDED_RELEASE_TABLET | Freq: Once | ORAL | Status: AC
  Administered 2019-08-06: 30 meq via ORAL
  Filled 2019-08-06: qty 1

## 2019-08-06 NOTE — Progress Notes (Signed)
While giving the bentyl, pt stated that she feels ACHy everywhere, weak, can't walk, has chest pain but pt has been coughing all night.  She even said that a medication makes her feel that way. Asked what specifically, pt stated must be"lasix". BP 135/77 (BP Location: Left Wrist)   Pulse (!) 104   Temp 98.8 F (37.1 C) (Oral)   Resp 19   Ht _0  (1.626 m)   Wt 120 kg   SpO2 100%   BMI 45.42 kg/m    Pt refused the bentyl. Passed on day shift.

## 2019-08-06 NOTE — Progress Notes (Signed)
PROGRESS NOTE    Kathy Irwin  HYW:[id number] DOB: 1950/10/02 DOA: 08/01/2019 PCP: Kerin Perna, NP     Brief Narrative:  Kathy Irwin is a 69 year old female with history of interstitial lung disease on oxygen at home, insulin-dependent diabetes mellitus type 2, hypertension, chronic thrombocytopenia, chronic diastolic CHF who presents to the emergency department with complaints of progressive exertional dyspnea, bilateral lower extremity swelling. On presentation she was saturating low 80s on room air.  Chest x-ray showed vascular congestion, interstitial edema.  Patient was admitted for the management of acute on chronic diastolic CHF exacerbation.  Started on IV Lasix.  New events last 24 hours / Subjective: She states that she has been having some intermittent chest pain, feeling achy all over especially in her lower extremities.  She did have a low-grade fever 100.9 yesterday.  States that she is having productive cough of white sputum.  Assessment & Plan:   Principal Problem:   Acute on chronic diastolic CHF (congestive heart failure) (HCC) Active Problems:   Insulin-requiring or dependent type II diabetes mellitus (HCC)   Thrombocytopenia (HCC)   Interstitial lung disease (HCC)   Essential hypertension   Chronic respiratory failure with hypoxia (HCC)   Acute on chronic respiratory failure with hypoxemia (HCC)   Acute on chronic diastolic CHF -Presented with exertional dyspnea, bilateral lower extremity swelling.  Chest x-ray on admission showed vascular congestion, interstitial edema.  She is on Lasix, metolazone at home. Echocardiogram on 04/2019 showed ejection fraction of 60%.  -Repeat echocardiogram showed hyperdynamic LV, EF 70 to 75% without wall motion abnormality -Continue IV Lasix  Fever with productive cough -Check respiratory viral panel -Cough syrup ordered as needed -Repeat chest x-ray in a.m.  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 Vaughan Browner -Uses 3 L nasal cannula O2 at baseline  Thrombocytopenia -Chronic. Follows with Dr. Irene Limbo. There was suspicion for CML but no evidence of BCR/ABL rearrangement as per previous records. Previous hospitalist discussed with Dr. Irene Limbo on phone and he recommended outpatient follow-up .    Hypertension -Continue Toprol  Hyperlipidemia -Continue Pravachol  GERD -Continue PPI  Depression/anxiety -Continue Zoloft  Insulin-dependent diabetes mellitus, poorly controlled -Hemoglobin A1c of 8.4% as per December 2020 -Lantus, sliding scale insulin  Morbid obesity -Estimated body mass index is 45.42 kg/m as calculated from the following:   Height as of this encounter: _0  (1.626 m).   Weight as of this encounter: 120 kg.'   DVT prophylaxis: SCDs, due to thrombocytopenia Code Status: Full code Family Communication: No family at bedside Disposition Plan:   Status is: Inpatient  Remains inpatient appropriate because:IV treatments appropriate due to intensity of illness or inability to take PO   Dispo: The patient is from: Home              Anticipated d/c is to: Home              Anticipated d/c date is: 2 days              Patient currently is not medically stable to d/c.   Consultants:   None  Procedures:   None  Antimicrobials:  Anti-infectives (From admission, onward)   None        Objective: Vitals:   08/05/19 2105 08/06/19 0500 08/06/19 1042 08/06/19 1119  BP: 140/63 135/77 109/67 133/81  Pulse: 81 (!) 104 (!) 110 (!) 104  Resp: _1 Temp: 100.3 F (37.9  C) 98.8 F (37.1 C)  99.2 F (37.3 C)  TempSrc: Oral Oral  Oral  SpO2: 99% 100% 97% 99%  Weight:  120 kg    Height:        Intake/Output Summary (Last 24 hours) at 08/06/2019 1339 Last data filed at 08/06/2019 0900 Gross per 24 hour  Intake 720 ml  Output 1500 ml  Net -780 ml   Filed Weights    08/04/19 0136 08/05/19 0352 08/06/19 0500  Weight: 120.2 kg 119.9 kg 120 kg    Examination:  General exam: Appears calm and comfortable  Respiratory system: Bibasilar crackles.  On 3 L nasal cannula O2.  Respiratory effort normal. No respiratory distress. No conversational dyspnea.  Cardiovascular system: S1 & S2 heard, RRR. No murmurs.  Bilateral nonpitting pedal edema. Gastrointestinal system: Abdomen is nondistended, soft and nontender. Normal bowel sounds heard. Central nervous system: Alert and oriented. No focal neurological deficits. Speech clear.  Extremities: Symmetric in appearance  Skin: No rashes, lesions or ulcers on exposed skin  Psychiatry: Judgement and insight appear normal. Mood & affect appropriate.   Data Reviewed: I have personally reviewed following labs and imaging studies  CBC: Recent Labs  Lab 08/01/19 1429 08/02/19 1057 08/03/19 0404 08/04/19 0513 08/05/19 0514  WBC 8.8 6.3 5.4 4.1 6.2  NEUTROABS  --  2.0 1.1* 1.6* 1.9  HGB 14.6 14.5 13.7 14.1 13.5  HCT 46.2* 47.2* 43.9 44.9 43.0  MCV 100.4* 102.4* 101.6* 101.8* 101.7*  PLT 67* 69* 61* 46* 43*   Basic Metabolic Panel: Recent Labs  Lab 08/01/19 1429 08/02/19 1057 08/03/19 0404 08/04/19 0513 08/05/19 0514 08/06/19 0451  NA 139  --  138 139 138 138  K 4.5  --  4.8 3.9 3.5 3.6  CL 104  --  100 99 99 95*  CO2 26  --  _0 GLUCOSE 153*  --  196* 172* 128* 170*  BUN 7*  --  _1 CREATININE 0.70  --  0.95 0.94 0.95 0.84  CALCIUM 9.9  --  9.4 9.3 8.8* 9.5  MG  --  1.9 1.6* 1.9  --  1.6*   GFR: Estimated Creatinine Clearance: 80.6 mL/min (by C-G formula based on SCr of 0.84 mg/dL). Liver Function Tests: Recent Labs  Lab 08/02/19 1057 08/03/19 0404 08/04/19 0513  AST _2 ALT _3 ALKPHOS 68 57 59  BILITOT 1.5* 1.3* 1.5*  PROT 7.2 6.3* 6.5  ALBUMIN 3.8 3.7 3.7   No results for input(s): LIPASE, AMYLASE in the last 168 hours. No results for input(s): AMMONIA in  the last 168 hours. Coagulation Profile: No results for input(s): INR, PROTIME in the last 168 hours. Cardiac Enzymes: No results for input(s): CKTOTAL, CKMB, CKMBINDEX, TROPONINI in the last 168 hours. BNP (last 3 results) Recent Labs    03/03/19 1151  PROBNP 70.0   HbA1C: No results for input(s): HGBA1C in the last 72 hours. CBG: Recent Labs  Lab 08/05/19 1128 08/05/19 1654 08/05/19 2137 08/06/19 0634 08/06/19 1117  GLUCAP 181* 173* 197* 215* 184*   Lipid Profile: No results for input(s): CHOL, HDL, LDLCALC, TRIG, CHOLHDL, LDLDIRECT in the last 72 hours. Thyroid Function Tests: No results for input(s): TSH, T4TOTAL, FREET4, T3FREE, THYROIDAB in the last 72 hours. Anemia Panel: No results for input(s): VITAMINB12, FOLATE, FERRITIN, TIBC, IRON, RETICCTPCT in the last 72 hours. Sepsis Labs: No results for input(s): PROCALCITON, LATICACIDVEN in the last 168 hours.  Recent Results (from the past 240 hour(s))  SARS CORONAVIRUS 2 (TAT 6-24 HRS) Nasopharyngeal Nasopharyngeal Swab     Status: None   Collection Time: 08/02/19  3:00 AM   Specimen: Nasopharyngeal Swab  Result Value Ref Range Status   SARS Coronavirus 2 NEGATIVE NEGATIVE Final    Comment: (NOTE) SARS-CoV-2 target nucleic acids are NOT DETECTED. The SARS-CoV-2 RNA is generally detectable in upper and lower respiratory specimens during the acute phase of infection. Negative results do not preclude SARS-CoV-2 infection, do not rule out co-infections with other pathogens, and should not be used as the sole basis for treatment or other patient management decisions. Negative results must be combined with clinical observations, patient history, and epidemiological information. The expected result is Negative. Fact Sheet for Patients: SugarRoll.be Fact Sheet for Healthcare Providers: https://www.woods-mathews.com/ This test is not yet approved or cleared by the [place]  FDA and  has been authorized for detection and/or diagnosis of SARS-CoV-2 by FDA under an Emergency Use Authorization (EUA). This EUA will remain  in effect (meaning this test can be used) for the duration of the COVID-19 declaration under Section 56 4(b)(1) of the Act, 21 U.S.C. section 360bbb-3(b)(1), unless the authorization is terminated or revoked sooner. Performed at [facility], [address], [place], [place] [address]       Radiology Studies: ECHOCARDIOGRAM COMPLETE  Result Date: 08/06/2019    ECHOCARDIOGRAM REPORT   Patient Name:   Kathy Irwin Date of Exam: 08/06/2019 Medical Rec #:  [id number]     Height:       64.0 in Accession #:    [id number]    Weight:       264.6 lb Date of Birth:  [birth year]      BSA:          2.204 m Patient Age:    19 years      BP:           135/77 mmHg Patient Gender: F             HR:           115 bpm. Exam Location:  Inpatient Procedure: 2D Echo, Cardiac Doppler and Color Doppler Indications:    I50.33 Acute on chronic diastolic (congestive) heart failure  History:        Patient has prior history of Echocardiogram examinations, most                 recent 04/22/2019. Risk Factors:Hypertension and Diabetes.                 COVID-19.  Sonographer:    Jonelle Sidle Dance Referring Phys: [id number] AMRIT ADHIKARI  Sonographer Comments: Suboptimal parasternal window. IMPRESSIONS  1. Hyperdynamic LV. LV intracavitary gradient ~10 mmHG. Left ventricular ejection fraction, by estimation, is 70 to 75%. The left ventricle has hyperdynamic function. The left ventricle has no regional wall motion abnormalities. Indeterminate diastolic filling due to E-A fusion.  2. Right ventricular systolic function is normal. The right ventricular size is mildly enlarged. There is moderately elevated pulmonary artery systolic pressure. The estimated right ventricular systolic pressure is 96.4 mmHg.  3. The mitral valve is grossly normal. Trivial mitral valve regurgitation. No evidence of  mitral stenosis.  4. The aortic valve is grossly normal. Aortic valve regurgitation is not visualized. Mild aortic valve sclerosis is present, with no evidence of aortic valve stenosis.  5. The inferior vena cava is normal in size with greater than 50% respiratory variability,  suggesting right atrial pressure of 3 mmHg. Comparison(s): Changes from prior study are noted. EF now hyperdynamic. RV mildly enlarged. RVSP moderately elevated. FINDINGS  Left Ventricle: Hyperdynamic LV. LV intracavitary gradient ~10 mmHG. Left ventricular ejection fraction, by estimation, is 70 to 75%. The left ventricle has hyperdynamic function. The left ventricle has no regional wall motion abnormalities. The left ventricular internal cavity size was normal in size. There is no left ventricular hypertrophy. Indeterminate diastolic filling due to E-A fusion. Right Ventricle: The right ventricular size is mildly enlarged. No increase in right ventricular wall thickness. Right ventricular systolic function is normal. There is moderately elevated pulmonary artery systolic pressure. The tricuspid regurgitant velocity is 3.48 m/s, and with an assumed right atrial pressure of 3 mmHg, the estimated right ventricular systolic pressure is 53.9 mmHg. Left Atrium: Left atrial size was normal in size. Right Atrium: Right atrial size was normal in size. Pericardium: There is no evidence of pericardial effusion. Mitral Valve: The mitral valve is grossly normal. Trivial mitral valve regurgitation. No evidence of mitral valve stenosis. Tricuspid Valve: The tricuspid valve is grossly normal. Tricuspid valve regurgitation is trivial. No evidence of tricuspid stenosis. Aortic Valve: The aortic valve is grossly normal.. There is mild thickening and mild calcification of the aortic valve. Aortic valve regurgitation is not visualized. Mild aortic valve sclerosis is present, with no evidence of aortic valve stenosis. There  is mild thickening of the aortic  valve. There is mild calcification of the aortic valve. Pulmonic Valve: The pulmonic valve was grossly normal. Pulmonic valve regurgitation is not visualized. No evidence of pulmonic stenosis. Aorta: The aortic root and ascending aorta are structurally normal, with no evidence of dilitation. Venous: The inferior vena cava is normal in size with greater than 50% respiratory variability, suggesting right atrial pressure of 3 mmHg. IAS/Shunts: The atrial septum is grossly normal.  LEFT VENTRICLE PLAX 2D LVIDd:         4.20 cm  Diastology LVIDs:         3.50 cm  LV e' lateral:   8.81 cm/s LV PW:         1.30 cm  LV E/e' lateral: 9.1 LV IVS:        1.10 cm  LV e' medial:    7.94 cm/s LVOT diam:     2.20 cm  LV E/e' medial:  10.1 LV SV:         83 LV SV Index:   38 LVOT Area:     3.80 cm  RIGHT VENTRICLE             IVC RV Basal diam:  3.10 cm     IVC diam: 1.60 cm RV Mid diam:    2.30 cm RV S prime:     13.70 cm/s TAPSE (M-mode): 2.1 cm LEFT ATRIUM             Index       RIGHT ATRIUM           Index LA diam:        5.00 cm 2.27 cm/m  RA Area:     14.20 cm LA Vol (A2C):   37.8 ml 17.15 ml/m RA Volume:   32.40 ml  14.70 ml/m LA Vol (A4C):   27.4 ml 12.43 ml/m LA Biplane Vol: 32.6 ml 14.79 ml/m  AORTIC VALVE LVOT Vmax:   142.00 cm/s LVOT Vmean:  85.300 cm/s LVOT VTI:    0.218 m  AORTA Ao Root diam: 3.10  cm Ao Asc diam:  3.60 cm MITRAL VALVE                TRICUSPID VALVE MV Area (PHT): 3.60 cm     TR Peak grad:   48.4 mmHg MV Decel Time: 211 msec     TR Vmax:        348.00 cm/s MV E velocity: 80.50 cm/s MV A velocity: 109.00 cm/s  SHUNTS MV E/A ratio:  0.74         Systemic VTI:  0.22 m                             Systemic Diam: 2.20 cm Eleonore Chiquito MD Electronically signed by Eleonore Chiquito MD Signature Date/Time: 08/06/2019/11:46:00 AM    Final       Scheduled Meds: . dicyclomine  20 mg Oral TID AC & HS  . furosemide  60 mg Intravenous Q12H  . gabapentin  300 mg Oral TID  . insulin aspart  0-9 Units  Subcutaneous TID WC  . insulin glargine  20 Units Subcutaneous BID  . loratadine  10 mg Oral Daily  . metoprolol  200 mg Oral Daily  . montelukast  10 mg Oral QHS  . pantoprazole  40 mg Oral Daily  . pravastatin  40 mg Oral q1800  . sertraline  100 mg Oral Daily  . sodium chloride flush  3 mL Intravenous Q12H   Continuous Infusions: . sodium chloride       LOS: 4 days      Time spent: 40 minutes   Dessa Phi, DO [facility] 08/06/2019, 1:39 PM   Available via Epic secure chat 7am-7pm After these hours, please refer to coverage provider listed on [email or web address]

## 2019-08-06 NOTE — Telephone Encounter (Signed)
Scheduled appt per 4/20 sch message - pt to get an updated schedule with discharge papers.

## 2019-08-06 NOTE — Plan of Care (Signed)
  Problem: Education: Goal: Knowledge of General Education information will improve Description: Including pain rating scale, medication(s)/side effects and non-pharmacologic comfort measures Outcome: Progressing   Problem: Health Behavior/Discharge Planning: Goal: Ability to manage health-related needs will improve Outcome: Progressing   Problem: Clinical Measurements: Goal: Ability to maintain clinical measurements within normal limits will improve Outcome: Progressing   Problem: Safety: Goal: Ability to remain free from injury will improve Outcome: Progressing

## 2019-08-06 NOTE — Progress Notes (Signed)
Patient off of unit to echo.

## 2019-08-06 NOTE — Progress Notes (Addendum)
Patient returned to unit. Requesting to speak with MD before taking any morning medications, Kathy Roes, MD paged.

## 2019-08-07 ENCOUNTER — Inpatient Hospital Stay (HOSPITAL_COMMUNITY): Payer: Medicare Other

## 2019-08-07 DIAGNOSIS — I5033 Acute on chronic diastolic (congestive) heart failure: Secondary | ICD-10-CM | POA: Diagnosis not present

## 2019-08-07 LAB — CBC
HCT: 42.8 % (ref 36.0–46.0)
Hemoglobin: 13.6 g/dL (ref 12.0–15.0)
MCH: 31.8 pg (ref 26.0–34.0)
MCHC: 31.8 g/dL (ref 30.0–36.0)
MCV: 100 fL (ref 80.0–100.0)
Platelets: 48 10*3/uL — ABNORMAL LOW (ref 150–400)
RBC: 4.28 MIL/uL (ref 3.87–5.11)
RDW: 19.1 % — ABNORMAL HIGH (ref 11.5–15.5)
WBC: 7.7 10*3/uL (ref 4.0–10.5)
nRBC: 0.7 % — ABNORMAL HIGH (ref 0.0–0.2)

## 2019-08-07 LAB — GLUCOSE, CAPILLARY
Glucose-Capillary: 178 mg/dL — ABNORMAL HIGH (ref 70–99)
Glucose-Capillary: 223 mg/dL — ABNORMAL HIGH (ref 70–99)
Glucose-Capillary: 214 mg/dL — ABNORMAL HIGH (ref 70–99)
Glucose-Capillary: 226 mg/dL — ABNORMAL HIGH (ref 70–99)

## 2019-08-07 LAB — MAGNESIUM: Magnesium: 1.8 mg/dL (ref 1.7–2.4)

## 2019-08-07 LAB — BASIC METABOLIC PANEL
Anion gap: 9 (ref 5–15)
BUN: 13 mg/dL (ref 8–23)
CO2: 33 mmol/L — ABNORMAL HIGH (ref 22–32)
Calcium: 9.6 mg/dL (ref 8.9–10.3)
Chloride: 95 mmol/L — ABNORMAL LOW (ref 98–111)
Creatinine, Ser: 0.75 mg/dL (ref 0.44–1.00)
GFR calc Af Amer: >60 mL/min (ref >60)
GFR calc non Af Amer: >60 mL/min (ref >60)
Glucose, Bld: 164 mg/dL — ABNORMAL HIGH (ref 70–99)
Potassium: 4 mmol/L (ref 3.5–5.1)
Sodium: 137 mmol/L (ref 135–145)

## 2019-08-07 NOTE — Progress Notes (Signed)
PROGRESS NOTE    Ronit Cranfield  ZOX:[id number] DOB: [birth year] DOA: 08/01/2019 PCP: Kerin Perna, NP     Brief Narrative:  Kathy Irwin is a 69 year old female with history of interstitial lung disease on oxygen at home, insulin-dependent diabetes mellitus type 2, hypertension, chronic thrombocytopenia, chronic diastolic CHF who presents to the emergency department with complaints of progressive exertional dyspnea, bilateral lower extremity swelling. On presentation she was saturating low 80s on room air.  Chest x-ray showed vascular congestion, interstitial edema.  Patient was admitted for the management of acute on chronic diastolic CHF exacerbation.  Started on IV Lasix.  New events last 24 hours / Subjective: Afebrile overnight. No change in symptoms, continues to have peripheral edema which she feels has improved a little bit. Continues to have productive cough of white sputum.   Assessment & Plan:   Principal Problem:   Acute on chronic diastolic CHF (congestive heart failure) (HCC) Active Problems:   Insulin-requiring or dependent type II diabetes mellitus (HCC)   Thrombocytopenia (HCC)   Interstitial lung disease (HCC)   Essential hypertension   Chronic respiratory failure with hypoxia (HCC)   Acute on chronic respiratory failure with hypoxemia (HCC)   Acute on chronic diastolic CHF -Presented with exertional dyspnea, bilateral lower extremity swelling.  Chest x-ray on admission showed vascular congestion, interstitial edema.  She is on Lasix, metolazone at home. Echocardiogram on 04/2019 showed ejection fraction of 60%.  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 Vaughan Browner -Uses 3 L nasal cannula O2 at baseline  Thrombocytopenia -Chronic. Follows with Dr. Irene Limbo. There was suspicion for CML but no evidence of BCR/ABL rearrangement as per previous records. Previous hospitalist discussed with Dr. Irene Limbo on phone and he recommended outpatient follow-up .    Hypertension -Continue Toprol  Hyperlipidemia -Continue Pravachol  GERD -Continue PPI  Depression/anxiety -Continue Zoloft  Insulin-dependent diabetes mellitus, poorly controlled -Hemoglobin A1c of 8.4% as per December 2020 -Lantus, sliding scale insulin  Morbid obesity -Estimated body mass index is 45.38 kg/m as calculated from the following:   Height as of this encounter: _0  (1.626 m).   Weight as of this encounter: 119.9 kg.'   DVT prophylaxis: SCDs, due to thrombocytopenia Code Status: Full code Family Communication: No family at bedside Disposition Plan:   Status is: Inpatient  Remains inpatient appropriate because:IV treatments appropriate due to intensity of illness or inability to take PO   Dispo: The patient is from: Home              Anticipated d/c is to: Home              Anticipated d/c date is: 2 days              Patient currently is not medically stable to d/c. Continue IV lasix    Consultants:   None  Procedures:   None  Antimicrobials:  Anti-infectives (From admission, onward)   None       Objective: Vitals:   08/06/19 1042 08/06/19 1119 08/06/19 2027 08/07/19 0354  BP: 109/67 133/81 125/74 (!) 112/98  Pulse: (!) 110 (!)  104 70 89  Resp: _0 Temp:  99.2 F (37.3 C) 97.8 F (36.6 C) 98.9 F (37.2 C)  TempSrc:  Oral Oral Oral  SpO2: 97% 99% 100% 100%  Weight:    119.9 kg  Height:        Intake/Output Summary (Last 24 hours) at 08/07/2019 1042 Last data filed at  08/07/2019 0827 Gross per 24 hour  Intake 1097.76 ml  Output 1300 ml  Net -202.24 ml   Filed Weights   08/05/19 0352 08/06/19 0500 08/07/19 0354  Weight: 119.9 kg 120 kg 119.9 kg    Examination: General exam: Appears calm and comfortable  Respiratory system: Crackles basilar, on room air ([place] O2 off to the side of bed)  Cardiovascular system: S1 & S2 heard, RRR. +Bilateral edema  Gastrointestinal system: Abdomen is nondistended, soft and nontender. Normal bowel sounds heard. Central nervous system: Alert and oriented. Non focal exam. Speech clear  Extremities: Symmetric in appearance bilaterally  Skin: No rashes, lesions or ulcers on exposed skin  Psychiatry: Judgement and insight appear stable. Mood & affect appropriate.    Data Reviewed: I have personally reviewed following labs and imaging studies  CBC: Recent Labs  Lab 08/02/19 1057 08/03/19 0404 08/04/19 0513 08/05/19 0514 08/07/19 0627  WBC 6.3 5.4 4.1 6.2 7.7  NEUTROABS 2.0 1.1* 1.6* 1.9  --   HGB 14.5 13.7 14.1 13.5 13.6  HCT 47.2* 43.9 44.9 43.0 42.8  MCV 102.4* 101.6* 101.8* 101.7* 100.0  PLT 69* 61* 46* 43* 48*   Basic Metabolic Panel: Recent Labs  Lab 08/01/19 1429 08/02/19 1057 08/03/19 0404 08/04/19 0513 08/05/19 0514 08/06/19 0451 08/07/19 0627  NA   < >  --  138 139 138 138 137  K   < >  --  4.8 3.9 3.5 3.6 4.0  CL   < >  --  100 99 99 95* 95*  CO2   < >  --  _1 33*  GLUCOSE   < >  --  196* 172* 128* 170* 164*  BUN   < >  --  _2 CREATININE   < >  --  0.95 0.94 0.95 0.84 0.75  CALCIUM   < >  --  9.4 9.3 8.8* 9.5 9.6  MG  --  1.9 1.6* 1.9  --  1.6* 1.8   < > = values in this interval not displayed.   GFR: Estimated Creatinine Clearance: 84.7 mL/min (by C-G formula based on SCr of 0.75 mg/dL). Liver Function Tests: Recent Labs  Lab 08/02/19 1057 08/03/19 0404 08/04/19 0513  AST _3 ALT _4 ALKPHOS 68 57 59  BILITOT 1.5* 1.3* 1.5*  PROT 7.2 6.3* 6.5    ALBUMIN 3.8 3.7 3.7   No results for input(s): LIPASE, AMYLASE in the last 168 hours. No results for input(s): AMMONIA in the last 168 hours. Coagulation Profile: No results for input(s): INR, PROTIME in the last 168 hours. Cardiac Enzymes: No results for input(s): CKTOTAL, CKMB, CKMBINDEX, TROPONINI in the last 168 hours. BNP (last 3 results) Recent Labs    03/03/19 1151  PROBNP 70.0   HbA1C: No results for input(s): HGBA1C in the last 72 hours. CBG: Recent Labs  Lab 08/06/19 0634 08/06/19 1117 08/06/19 1622 08/06/19 2144 08/07/19 0629  GLUCAP 215* 184* 163* 180* 178*   Lipid Profile: No results for input(s): CHOL, HDL, LDLCALC, TRIG, CHOLHDL, LDLDIRECT in  the last 72 hours. Thyroid Function Tests: No results for input(s): TSH, T4TOTAL, FREET4, T3FREE, THYROIDAB in the last 72 hours. Anemia Panel: No results for input(s): VITAMINB12, FOLATE, FERRITIN, TIBC, IRON, RETICCTPCT in the last 72 hours. Sepsis Labs: No results for input(s): PROCALCITON, LATICACIDVEN in the last 168 hours.  Recent Results (from the past 240 hour(s))  SARS CORONAVIRUS 2 (TAT 6-24 HRS) Nasopharyngeal Nasopharyngeal Swab     Status: None   Collection Time: 08/02/19  3:00 AM   Specimen: Nasopharyngeal Swab  Result Value Ref Range Status   SARS Coronavirus 2 NEGATIVE NEGATIVE Final    Comment: (NOTE) SARS-CoV-2 target nucleic acids are NOT DETECTED. The SARS-CoV-2 RNA is generally detectable in upper and lower respiratory specimens during the acute phase of infection. Negative results do not preclude SARS-CoV-2 infection, do not rule out co-infections with other pathogens, and should not be used as the sole basis for treatment or other patient management decisions. Negative results must be combined with clinical observations, patient history, and epidemiological information. The expected result is Negative. Fact Sheet for Patients: SugarRoll.be Fact Sheet for  Healthcare Providers: https://www.woods-mathews.com/ This test is not yet approved or cleared by the [place] FDA and  has been authorized for detection and/or diagnosis of SARS-CoV-2 by FDA under an Emergency Use Authorization (EUA). This EUA will remain  in effect (meaning this test can be used) for the duration of the COVID-19 declaration under Section 56 4(b)(1) of the Act, 21 U.S.C. section 360bbb-3(b)(1), unless the authorization is terminated or revoked sooner. Performed at [facility], [place] [address], [place], [place] [address]   Respiratory Panel by PCR     Status: None   Collection Time: 08/06/19  2:32 PM   Specimen: Nasopharyngeal Swab; Respiratory  Result Value Ref Range Status   Adenovirus NOT DETECTED NOT DETECTED Final   Coronavirus 229E NOT DETECTED NOT DETECTED Final    Comment: (NOTE) The Coronavirus on the Respiratory Panel, DOES NOT test for the novel  Coronavirus (2019 nCoV)    Coronavirus HKU1 NOT DETECTED NOT DETECTED Final   Coronavirus NL63 NOT DETECTED NOT DETECTED Final   Coronavirus OC43 NOT DETECTED NOT DETECTED Final   Metapneumovirus NOT DETECTED NOT DETECTED Final   Rhinovirus / Enterovirus NOT DETECTED NOT DETECTED Final   Influenza A NOT DETECTED NOT DETECTED Final   Influenza B NOT DETECTED NOT DETECTED Final   Parainfluenza Virus 1 NOT DETECTED NOT DETECTED Final   Parainfluenza Virus 2 NOT DETECTED NOT DETECTED Final   Parainfluenza Virus 3 NOT DETECTED NOT DETECTED Final   Parainfluenza Virus 4 NOT DETECTED NOT DETECTED Final   Respiratory Syncytial Virus NOT DETECTED NOT DETECTED Final   Bordetella pertussis NOT DETECTED NOT DETECTED Final   Chlamydophila pneumoniae NOT DETECTED NOT DETECTED Final   Mycoplasma pneumoniae NOT DETECTED NOT DETECTED Final    Comment: Performed at [facility], [place]. [address] [place], [place] [address]      Radiology Studies: DG CHEST PORT 1 VIEW  Result Date:  08/07/2019 CLINICAL DATA:  CHF. EXAM: PORTABLE CHEST 1 VIEW COMPARISON:  08/01/2019. FINDINGS: Mediastinum is stable. Heart size stable. Bilateral interstitial prominence suggesting interstitial edema again noted. Similar findings noted on prior exam. No significant pleural effusion. No pneumothorax. IMPRESSION: Bilateral interstitial prominence suggesting interstitial edema again noted. Pneumonitis cannot be excluded. Similar findings noted on prior exam. Electronically Signed   By: [place]   On: 08/07/2019 06:07   ECHOCARDIOGRAM COMPLETE  Result Date:  08/06/2019    ECHOCARDIOGRAM REPORT   Patient Name:   Hildy Linse Date of Exam: 08/06/2019 Medical Rec #:  [id number]     Height:       64.0 in Accession #:    [id number]    Weight:       264.6 lb Date of Birth:  02-11-1951      BSA:          2.204 m Patient Age:    65 years      BP:           135/77 mmHg Patient Gender: F             HR:           115 bpm. Exam Location:  Inpatient Procedure: 2D Echo, Cardiac Doppler and Color Doppler Indications:    I50.33 Acute on chronic diastolic (congestive) heart failure  History:        Patient has prior history of Echocardiogram examinations, most                 recent 04/22/2019. Risk Factors:Hypertension and Diabetes.                 COVID-19.  Sonographer:    Jonelle Sidle Dance Referring Phys: [id number] AMRIT ADHIKARI  Sonographer Comments: Suboptimal parasternal window. IMPRESSIONS  1. Hyperdynamic LV. LV intracavitary gradient ~10 mmHG. Left ventricular ejection fraction, by estimation, is 70 to 75%. The left ventricle has hyperdynamic function. The left ventricle has no regional wall motion abnormalities. Indeterminate diastolic filling due to E-A fusion.  2. Right ventricular systolic function is normal. The right ventricular size is mildly enlarged. There is moderately elevated pulmonary artery systolic pressure. The estimated right ventricular systolic pressure is 92.3 mmHg.  3. The mitral valve is grossly  normal. Trivial mitral valve regurgitation. No evidence of mitral stenosis.  4. The aortic valve is grossly normal. Aortic valve regurgitation is not visualized. Mild aortic valve sclerosis is present, with no evidence of aortic valve stenosis.  5. The inferior vena cava is normal in size with greater than 50% respiratory variability, suggesting right atrial pressure of 3 mmHg. Comparison(s): Changes from prior study are noted. EF now hyperdynamic. RV mildly enlarged. RVSP moderately elevated. FINDINGS  Left Ventricle: Hyperdynamic LV. LV intracavitary gradient ~10 mmHG. Left ventricular ejection fraction, by estimation, is 70 to 75%. The left ventricle has hyperdynamic function. The left ventricle has no regional wall motion abnormalities. The left ventricular internal cavity size was normal in size. There is no left ventricular hypertrophy. Indeterminate diastolic filling due to E-A fusion. Right Ventricle: The right ventricular size is mildly enlarged. No increase in right ventricular wall thickness. Right ventricular systolic function is normal. There is moderately elevated pulmonary artery systolic pressure. The tricuspid regurgitant velocity is 3.48 m/s, and with an assumed right atrial pressure of 3 mmHg, the estimated right ventricular systolic pressure is 30.0 mmHg. Left Atrium: Left atrial size was normal in size. Right Atrium: Right atrial size was normal in size. Pericardium: There is no evidence of pericardial effusion. Mitral Valve: The mitral valve is grossly normal. Trivial mitral valve regurgitation. No evidence of mitral valve stenosis. Tricuspid Valve: The tricuspid valve is grossly normal. Tricuspid valve regurgitation is trivial. No evidence of tricuspid stenosis. Aortic Valve: The aortic valve is grossly normal.. There is mild thickening and mild calcification of the aortic valve. Aortic valve regurgitation is not visualized. Mild aortic valve sclerosis is present, with no evidence of aortic  valve  stenosis. There  is mild thickening of the aortic valve. There is mild calcification of the aortic valve. Pulmonic Valve: The pulmonic valve was grossly normal. Pulmonic valve regurgitation is not visualized. No evidence of pulmonic stenosis. Aorta: The aortic root and ascending aorta are structurally normal, with no evidence of dilitation. Venous: The inferior vena cava is normal in size with greater than 50% respiratory variability, suggesting right atrial pressure of 3 mmHg. IAS/Shunts: The atrial septum is grossly normal.  LEFT VENTRICLE PLAX 2D LVIDd:         4.20 cm  Diastology LVIDs:         3.50 cm  LV e' lateral:   8.81 cm/s LV PW:         1.30 cm  LV E/e' lateral: 9.1 LV IVS:        1.10 cm  LV e' medial:    7.94 cm/s LVOT diam:     2.20 cm  LV E/e' medial:  10.1 LV SV:         83 LV SV Index:   38 LVOT Area:     3.80 cm  RIGHT VENTRICLE             IVC RV Basal diam:  3.10 cm     IVC diam: 1.60 cm RV Mid diam:    2.30 cm RV S prime:     13.70 cm/s TAPSE (M-mode): 2.1 cm LEFT ATRIUM             Index       RIGHT ATRIUM           Index LA diam:        5.00 cm 2.27 cm/m  RA Area:     14.20 cm LA Vol (A2C):   37.8 ml 17.15 ml/m RA Volume:   32.40 ml  14.70 ml/m LA Vol (A4C):   27.4 ml 12.43 ml/m LA Biplane Vol: 32.6 ml 14.79 ml/m  AORTIC VALVE LVOT Vmax:   142.00 cm/s LVOT Vmean:  85.300 cm/s LVOT VTI:    0.218 m  AORTA Ao Root diam: 3.10 cm Ao Asc diam:  3.60 cm MITRAL VALVE                TRICUSPID VALVE MV Area (PHT): 3.60 cm     TR Peak grad:   48.4 mmHg MV Decel Time: 211 msec     TR Vmax:        348.00 cm/s MV E velocity: 80.50 cm/s MV A velocity: 109.00 cm/s  SHUNTS MV E/A ratio:  0.74         Systemic VTI:  0.22 m                             Systemic Diam: 2.20 cm Eleonore Chiquito MD Electronically signed by Eleonore Chiquito MD Signature Date/Time: 08/06/2019/11:46:00 AM    Final       Scheduled Meds: . dicyclomine  20 mg Oral TID AC & HS  . furosemide  60 mg Intravenous Q12H  .  gabapentin  300 mg Oral TID  . insulin aspart  0-9 Units Subcutaneous TID WC  . insulin glargine  20 Units Subcutaneous BID  . loratadine  10 mg Oral Daily  . metoprolol  200 mg Oral Daily  . montelukast  10 mg Oral QHS  . pantoprazole  40 mg Oral Daily  . pravastatin  40 mg Oral q1800  . sertraline  100 mg Oral Daily  . sodium chloride flush  3 mL Intravenous Q12H   Continuous Infusions: . sodium chloride 250 mL (08/06/19 1457)     LOS: 5 days      Time spent: 25 minutes   Dessa Phi, DO [facility] 08/07/2019, 10:42 AM   Available via Epic secure chat 7am-7pm After these hours, please refer to coverage provider listed on [email or web address]

## 2019-08-07 NOTE — TOC Progression Note (Signed)
Transition of Care [facility]) - Progression Note    Patient Details  Name: Kathy Irwin MRN: [id number] Date of Birth: 08/11/1950  [facility] CM/SW Contact  Zenon Mayo, RN Phone Number: 08/07/2019, 4:40 PM  Clinical Narrative:    NCM spoke with patient , she states she is not homebound she plans to go back to work. She also states she does not need any DME right now .  If she changes her mind she will let this NCM know.        Expected Discharge Plan and Services                                                 Social Determinants of Health (SDOH) Interventions    Readmission Risk Interventions No flowsheet data found.

## 2019-08-07 NOTE — Progress Notes (Signed)
Pt had a 10 beat run of SVT , pt is asymptomatic  MD notified  Will continue to monitor

## 2019-08-08 DIAGNOSIS — I5033 Acute on chronic diastolic (congestive) heart failure: Secondary | ICD-10-CM | POA: Diagnosis not present

## 2019-08-08 LAB — GLUCOSE, CAPILLARY
Glucose-Capillary: 144 mg/dL — ABNORMAL HIGH (ref 70–99)
Glucose-Capillary: 222 mg/dL — ABNORMAL HIGH (ref 70–99)
Glucose-Capillary: 160 mg/dL — ABNORMAL HIGH (ref 70–99)
Glucose-Capillary: 230 mg/dL — ABNORMAL HIGH (ref 70–99)

## 2019-08-08 LAB — BASIC METABOLIC PANEL
Anion gap: 10 (ref 5–15)
BUN: 17 mg/dL (ref 8–23)
CO2: 34 mmol/L — ABNORMAL HIGH (ref 22–32)
Calcium: 10.1 mg/dL (ref 8.9–10.3)
Chloride: 95 mmol/L — ABNORMAL LOW (ref 98–111)
Creatinine, Ser: 0.82 mg/dL (ref 0.44–1.00)
GFR calc Af Amer: >60 mL/min (ref >60)
GFR calc non Af Amer: >60 mL/min (ref >60)
Glucose, Bld: 123 mg/dL — ABNORMAL HIGH (ref 70–99)
Potassium: 3.7 mmol/L (ref 3.5–5.1)
Sodium: 139 mmol/L (ref 135–145)

## 2019-08-08 MED ORDER — METOLAZONE 2.5 MG PO TABS
2.5000 mg | ORAL_TABLET | Freq: Once | ORAL | Status: AC
  Administered 2019-08-08: 2.5 mg via ORAL
  Filled 2019-08-08: qty 1

## 2019-08-08 MED ORDER — SENNOSIDES-DOCUSATE SODIUM 8.6-50 MG PO TABS: 1.0000 | ORAL_TABLET | Freq: Every evening | ORAL | Status: DC | PRN

## 2019-08-08 MED ORDER — TRAMADOL HCL 50 MG PO TABS: 50.0000 mg | ORAL_TABLET | Freq: Four times a day (QID) | ORAL | Status: DC | PRN

## 2019-08-08 MED ORDER — POLYETHYLENE GLYCOL 3350 17 G PO PACK
17.0000 g | PACK | Freq: Every day | ORAL | Status: DC
  Administered 2019-08-08 – 2019-08-09 (×2): 17 g via ORAL
  Filled 2019-08-08 (×2): qty 1

## 2019-08-08 NOTE — Progress Notes (Signed)
PROGRESS NOTE    Kathy Irwin  DTH:[id number] DOB: January 02, 1951 DOA: 08/01/2019 PCP: Kerin Perna, NP     Brief Narrative:  Kathy Irwin is a 69 year old female with history of interstitial lung disease on oxygen at home, insulin-dependent diabetes mellitus type 2, hypertension, chronic thrombocytopenia, chronic diastolic CHF who presents to the emergency department with complaints of progressive exertional dyspnea, bilateral lower extremity swelling. On presentation she was saturating low 80s on room air.  Chest x-ray showed vascular congestion, interstitial edema.  Patient was admitted for the management of acute on chronic diastolic CHF exacerbation.  Started on IV Lasix.   New events last 24 hours / Subjective: Continues to have cough, breathing well otherwise, continues to have lower extremity edema   Assessment & Plan:   Principal Problem:   Acute on chronic diastolic CHF (congestive heart failure) (HCC) Active Problems:   Insulin-requiring or dependent type II diabetes mellitus (HCC)   Thrombocytopenia (HCC)   Interstitial lung disease ([place])   Essential hypertension   Chronic respiratory failure with hypoxia (HCC)   Acute on chronic respiratory failure with hypoxemia (HCC)   Acute on chronic diastolic CHF -Presented with exertional dyspnea, bilateral lower extremity swelling.  Chest x-ray on admission showed vascular congestion, interstitial edema.  She is on Lasix, metolazone at home. Echocardiogram on 04/2019 showed ejection fraction of 60%.  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 Vaughan Browner -Uses 3 L nasal cannula O2 at baseline  Thrombocytopenia -Chronic. Follows with Dr. Irene Limbo. There was suspicion for CML but no evidence of BCR/ABL rearrangement as per previous records. Previous hospitalist discussed with Dr. Irene Limbo on phone and he recommended outpatient follow-up .    Hypertension -Continue Toprol  Hyperlipidemia -Continue Pravachol  GERD -Continue PPI  Depression/anxiety -Continue Zoloft  Insulin-dependent diabetes mellitus, poorly controlled -Hemoglobin A1c of 8.4% as per December 2020 -Lantus, sliding scale insulin  Morbid obesity -Estimated body mass index is 45.78 kg/m as calculated from the following:   Height as of this encounter: _0  (1.626 m).   Weight as of this encounter: 121 kg.'   DVT prophylaxis: SCDs, due to thrombocytopenia Code Status: Full code Family Communication: No family at bedside Disposition Plan:   Status is: Inpatient  Remains inpatient appropriate because:IV treatments appropriate due to intensity of illness or inability to take PO   Dispo: The patient is from: Home              Anticipated d/c is to: Home              Anticipated d/c date is: 2 days              Patient currently is not medically stable to d/c. Continue IV lasix    Consultants:   None  Procedures:   None  Antimicrobials:  Anti-infectives (From admission, onward)   None       Objective: Vitals:   08/07/19 0354 08/07/19 1151 08/07/19 2039 08/08/19 0407  BP: (!) 112/98 121/69 114/75 103/68  Pulse: 89 86 77 (!) 59  Resp: _1 19  Temp: 98.9 F (37.2 C) 98.7 F (37.1 C) 98.2 F (36.8 C) 98.2 F (36.8 C)  TempSrc: Oral Oral Oral Oral  SpO2: 100% 92% 100% 100%  Weight: 119.9 kg   121 kg  Height:        Intake/Output Summary (Last 24 hours) at 08/08/2019 1007 Last data filed at 08/08/2019 0407 Gross per 24 hour   Intake 480 ml  Output 1500 ml  Net -1020 ml   Filed Weights   08/06/19 0500 08/07/19 0354 08/08/19 0407  Weight: 120 kg 119.9 kg 121 kg   Examination: General exam: Appears calm and comfortable  Respiratory system: Clear to auscultation. Respiratory effort normal. On room air  Cardiovascular system: S1 & S2 heard, RRR. +bilateral nonpitting pedal edema. Gastrointestinal system: Abdomen is nondistended, soft and nontender. Normal bowel sounds heard. Central nervous system: Alert and oriented. Non focal exam. Speech clear  Extremities: Symmetric in appearance bilaterally  Skin: No rashes, lesions or ulcers on exposed skin  Psychiatry: Judgement and insight appear stable. Mood & affect appropriate.    Data Reviewed: I have personally reviewed following labs and imaging studies  CBC: Recent Labs  Lab 08/02/19 1057 08/03/19 0404 08/04/19 0513 08/05/19 0514 08/07/19 0627  WBC 6.3 5.4 4.1 6.2 7.7  NEUTROABS 2.0 1.1* 1.6* 1.9  --   HGB 14.5 13.7 14.1 13.5 13.6  HCT 47.2* 43.9 44.9 43.0 42.8  MCV 102.4* 101.6* 101.8* 101.7* 100.0  PLT 69* 61* 46* 43* 48*   Basic Metabolic Panel: Recent Labs  Lab 08/01/19 1429 08/02/19 1057 08/03/19 0404 08/03/19 0404 08/04/19 0513 08/05/19 0514 08/06/19 0451 08/07/19 0627 08/08/19 0658  NA   < >  --  138   < > 139 138 138 137 139  K   < >  --  4.8   < > 3.9 3.5 3.6 4.0 3.7  CL   < >  --  100   < > 99 99 95* 95* 95*  CO2   < >  --  28   < > _0 33* 34*  GLUCOSE   < >  --  196*   < > 172* 128* 170* 164* 123*  BUN   < >  --  15   < > _1 CREATININE   < >  --  0.95   < > 0.94 0.95 0.84 0.75 0.82  CALCIUM   < >  --  9.4   < > 9.3 8.8* 9.5 9.6 10.1  MG  --  1.9 1.6*  --  1.9  --  1.6* 1.8  --    < > = values in this interval not displayed.   GFR: Estimated Creatinine Clearance: 83 mL/min (by C-G formula based on SCr of 0.82 mg/dL). Liver Function Tests: Recent Labs  Lab 08/02/19 1057 08/03/19 0404 08/04/19 0513   AST _2 ALT _3 ALKPHOS 68 57 59  BILITOT 1.5* 1.3* 1.5*  PROT 7.2 6.3* 6.5  ALBUMIN 3.8 3.7 3.7   No results for input(s): LIPASE, AMYLASE in the last 168 hours. No results for input(s): AMMONIA in the last 168 hours. Coagulation Profile: No results for input(s): INR, PROTIME in the last 168 hours. Cardiac Enzymes: No results for input(s): CKTOTAL, CKMB, CKMBINDEX, TROPONINI in the last 168 hours. BNP (last 3 results) Recent Labs    03/03/19 1151  PROBNP 70.0   HbA1C: No results for input(s): HGBA1C in the last  72 hours. CBG: Recent Labs  Lab 08/07/19 0629 08/07/19 1101 08/07/19 1614 08/07/19 2127 08/08/19 0558  GLUCAP 178* 223* 214* 226* 144*   Lipid Profile: No results for input(s): CHOL, HDL, LDLCALC, TRIG, CHOLHDL, LDLDIRECT in the last 72 hours. Thyroid Function Tests: No results for input(s): TSH, T4TOTAL, FREET4, T3FREE, THYROIDAB in the last 72 hours. Anemia Panel: No results for input(s): VITAMINB12, FOLATE, FERRITIN, TIBC, IRON, RETICCTPCT in the last 72 hours. Sepsis Labs: No results for input(s): PROCALCITON, LATICACIDVEN in the last 168 hours.  Recent Results (from the past 240 hour(s))  SARS CORONAVIRUS 2 (TAT 6-24 HRS) Nasopharyngeal Nasopharyngeal Swab     Status: None   Collection Time: 08/02/19  3:00 AM   Specimen: Nasopharyngeal Swab  Result Value Ref Range Status   SARS Coronavirus 2 NEGATIVE NEGATIVE Final    Comment: (NOTE) SARS-CoV-2 target nucleic acids are NOT DETECTED. The SARS-CoV-2 RNA is generally detectable in upper and lower respiratory specimens during the acute phase of infection. Negative results do not preclude SARS-CoV-2 infection, do not rule out co-infections with other pathogens, and should not be used as the sole basis for treatment or other patient management decisions. Negative results must be combined with clinical observations, patient history, and epidemiological information. The expected result is  Negative. Fact Sheet for Patients: SugarRoll.be Fact Sheet for Healthcare Providers: https://www.woods-mathews.com/ This test is not yet approved or cleared by the [place] FDA and  has been authorized for detection and/or diagnosis of SARS-CoV-2 by FDA under an Emergency Use Authorization (EUA). This EUA will remain  in effect (meaning this test can be used) for the duration of the COVID-19 declaration under Section 56 4(b)(1) of the Act, 21 U.S.C. section 360bbb-3(b)(1), unless the authorization is terminated or revoked sooner. Performed at [facility], [place] [address], [place], [place] [address]   Respiratory Panel by PCR     Status: None   Collection Time: 08/06/19  2:32 PM   Specimen: Nasopharyngeal Swab; Respiratory  Result Value Ref Range Status   Adenovirus NOT DETECTED NOT DETECTED Final   Coronavirus 229E NOT DETECTED NOT DETECTED Final    Comment: (NOTE) The Coronavirus on the Respiratory Panel, DOES NOT test for the novel  Coronavirus (2019 nCoV)    Coronavirus HKU1 NOT DETECTED NOT DETECTED Final   Coronavirus NL63 NOT DETECTED NOT DETECTED Final   Coronavirus OC43 NOT DETECTED NOT DETECTED Final   Metapneumovirus NOT DETECTED NOT DETECTED Final   Rhinovirus / Enterovirus NOT DETECTED NOT DETECTED Final   Influenza A NOT DETECTED NOT DETECTED Final   Influenza B NOT DETECTED NOT DETECTED Final   Parainfluenza Virus 1 NOT DETECTED NOT DETECTED Final   Parainfluenza Virus 2 NOT DETECTED NOT DETECTED Final   Parainfluenza Virus 3 NOT DETECTED NOT DETECTED Final   Parainfluenza Virus 4 NOT DETECTED NOT DETECTED Final   Respiratory Syncytial Virus NOT DETECTED NOT DETECTED Final   Bordetella pertussis NOT DETECTED NOT DETECTED Final   Chlamydophila pneumoniae NOT DETECTED NOT DETECTED Final   Mycoplasma pneumoniae NOT DETECTED NOT DETECTED Final    Comment: Performed at [facility], [place]. [address]  [place], [place] [address]      Radiology Studies: DG CHEST PORT 1 VIEW  Result Date: 08/07/2019 CLINICAL DATA:  CHF. EXAM: PORTABLE CHEST 1 VIEW COMPARISON:  08/01/2019. FINDINGS: Mediastinum is stable. Heart size stable. Bilateral interstitial prominence suggesting interstitial edema again noted. Similar findings noted on prior exam. No significant pleural effusion. No pneumothorax.  IMPRESSION: Bilateral interstitial prominence suggesting interstitial edema again noted. Pneumonitis cannot be excluded. Similar findings noted on prior exam. Electronically Signed   By: Marcello Moores  Register   On: 08/07/2019 06:07   ECHOCARDIOGRAM COMPLETE  Result Date: 08/06/2019    ECHOCARDIOGRAM REPORT   Patient Name:   Chaunta Metzgar Date of Exam: 08/06/2019 Medical Rec #:  [id number]     Height:       64.0 in Accession #:    [id number]    Weight:       264.6 lb Date of Birth:  [birth year]      BSA:          2.204 m Patient Age:    16 years      BP:           135/77 mmHg Patient Gender: F             HR:           115 bpm. Exam Location:  Inpatient Procedure: 2D Echo, Cardiac Doppler and Color Doppler Indications:    I50.33 Acute on chronic diastolic (congestive) heart failure  History:        Patient has prior history of Echocardiogram examinations, most                 recent 04/22/2019. Risk Factors:Hypertension and Diabetes.                 COVID-19.  Sonographer:    Jonelle Sidle Dance Referring Phys: [id number] AMRIT ADHIKARI  Sonographer Comments: Suboptimal parasternal window. IMPRESSIONS  1. Hyperdynamic LV. LV intracavitary gradient ~10 mmHG. Left ventricular ejection fraction, by estimation, is 70 to 75%. The left ventricle has hyperdynamic function. The left ventricle has no regional wall motion abnormalities. Indeterminate diastolic filling due to E-A fusion.  2. Right ventricular systolic function is normal. The right ventricular size is mildly enlarged. There is moderately elevated pulmonary artery systolic pressure. The estimated  right ventricular systolic pressure is 28.3 mmHg.  3. The mitral valve is grossly normal. Trivial mitral valve regurgitation. No evidence of mitral stenosis.  4. The aortic valve is grossly normal. Aortic valve regurgitation is not visualized. Mild aortic valve sclerosis is present, with no evidence of aortic valve stenosis.  5. The inferior vena cava is normal in size with greater than 50% respiratory variability, suggesting right atrial pressure of 3 mmHg. Comparison(s): Changes from prior study are noted. EF now hyperdynamic. RV mildly enlarged. RVSP moderately elevated. FINDINGS  Left Ventricle: Hyperdynamic LV. LV intracavitary gradient ~10 mmHG. Left ventricular ejection fraction, by estimation, is 70 to 75%. The left ventricle has hyperdynamic function. The left ventricle has no regional wall motion abnormalities. The left ventricular internal cavity size was normal in size. There is no left ventricular hypertrophy. Indeterminate diastolic filling due to E-A fusion. Right Ventricle: The right ventricular size is mildly enlarged. No increase in right ventricular wall thickness. Right ventricular systolic function is normal. There is moderately elevated pulmonary artery systolic pressure. The tricuspid regurgitant velocity is 3.48 m/s, and with an assumed right atrial pressure of 3 mmHg, the estimated right ventricular systolic pressure is 15.1 mmHg. Left Atrium: Left atrial size was normal in size. Right Atrium: Right atrial size was normal in size. Pericardium: There is no evidence of pericardial effusion. Mitral Valve: The mitral valve is grossly normal. Trivial mitral valve regurgitation. No evidence of mitral valve stenosis. Tricuspid Valve: The tricuspid valve is grossly normal. Tricuspid valve regurgitation is trivial. No evidence of tricuspid  stenosis. Aortic Valve: The aortic valve is grossly normal.. There is mild thickening and mild calcification of the aortic valve. Aortic valve regurgitation is not  visualized. Mild aortic valve sclerosis is present, with no evidence of aortic valve stenosis. There  is mild thickening of the aortic valve. There is mild calcification of the aortic valve. Pulmonic Valve: The pulmonic valve was grossly normal. Pulmonic valve regurgitation is not visualized. No evidence of pulmonic stenosis. Aorta: The aortic root and ascending aorta are structurally normal, with no evidence of dilitation. Venous: The inferior vena cava is normal in size with greater than 50% respiratory variability, suggesting right atrial pressure of 3 mmHg. IAS/Shunts: The atrial septum is grossly normal.  LEFT VENTRICLE PLAX 2D LVIDd:         4.20 cm  Diastology LVIDs:         3.50 cm  LV e' lateral:   8.81 cm/s LV PW:         1.30 cm  LV E/e' lateral: 9.1 LV IVS:        1.10 cm  LV e' medial:    7.94 cm/s LVOT diam:     2.20 cm  LV E/e' medial:  10.1 LV SV:         83 LV SV Index:   38 LVOT Area:     3.80 cm  RIGHT VENTRICLE             IVC RV Basal diam:  3.10 cm     IVC diam: 1.60 cm RV Mid diam:    2.30 cm RV S prime:     13.70 cm/s TAPSE (M-mode): 2.1 cm LEFT ATRIUM             Index       RIGHT ATRIUM           Index LA diam:        5.00 cm 2.27 cm/m  RA Area:     14.20 cm LA Vol (A2C):   37.8 ml 17.15 ml/m RA Volume:   32.40 ml  14.70 ml/m LA Vol (A4C):   27.4 ml 12.43 ml/m LA Biplane Vol: 32.6 ml 14.79 ml/m  AORTIC VALVE LVOT Vmax:   142.00 cm/s LVOT Vmean:  85.300 cm/s LVOT VTI:    0.218 m  AORTA Ao Root diam: 3.10 cm Ao Asc diam:  3.60 cm MITRAL VALVE                TRICUSPID VALVE MV Area (PHT): 3.60 cm     TR Peak grad:   48.4 mmHg MV Decel Time: 211 msec     TR Vmax:        348.00 cm/s MV E velocity: 80.50 cm/s MV A velocity: 109.00 cm/s  SHUNTS MV E/A ratio:  0.74         Systemic VTI:  0.22 m                             Systemic Diam: 2.20 cm Eleonore Chiquito MD Electronically signed by Eleonore Chiquito MD Signature Date/Time: 08/06/2019/11:46:00 AM    Final       Scheduled Meds: .  dicyclomine  20 mg Oral TID AC & HS  . furosemide  60 mg Intravenous Q12H  . gabapentin  300 mg Oral TID  . insulin aspart  0-9 Units Subcutaneous TID WC  . insulin glargine  20 Units Subcutaneous BID  . loratadine  10 mg Oral Daily  . metoprolol  200 mg Oral Daily  . montelukast  10 mg Oral QHS  . pantoprazole  40 mg Oral Daily  . polyethylene glycol  17 g Oral Daily  . pravastatin  40 mg Oral q1800  . sertraline  100 mg Oral Daily  . sodium chloride flush  3 mL Intravenous Q12H   Continuous Infusions: . sodium chloride 250 mL (08/06/19 1457)     LOS: 6 days      Time spent: 25 minutes   Dessa Phi, DO [facility] 08/08/2019, 10:07 AM   Available via Epic secure chat 7am-7pm After these hours, please refer to coverage provider listed on [email or web address]

## 2019-08-08 NOTE — Plan of Care (Signed)
  Problem: Education: Goal: Knowledge of General Education information will improve Description: Including pain rating scale, medication(s)/side effects and non-pharmacologic comfort measures Outcome: Progressing   Problem: Health Behavior/Discharge Planning: Goal: Ability to manage health-related needs will improve Outcome: Progressing   Problem: Clinical Measurements: Goal: Ability to maintain clinical measurements within normal limits will improve Outcome: Progressing   Problem: Nutrition: Goal: Adequate nutrition will be maintained Outcome: Progressing   Problem: Safety: Goal: Ability to remain free from injury will improve Outcome: Progressing

## 2019-08-08 NOTE — Plan of Care (Signed)

## 2019-08-09 DIAGNOSIS — I5033 Acute on chronic diastolic (congestive) heart failure: Secondary | ICD-10-CM | POA: Diagnosis not present

## 2019-08-09 LAB — BASIC METABOLIC PANEL
Anion gap: 12 (ref 5–15)
BUN: 17 mg/dL (ref 8–23)
CO2: 33 mmol/L — ABNORMAL HIGH (ref 22–32)
Calcium: 10.1 mg/dL (ref 8.9–10.3)
Chloride: 92 mmol/L — ABNORMAL LOW (ref 98–111)
Creatinine, Ser: 0.71 mg/dL (ref 0.44–1.00)
GFR calc Af Amer: >60 mL/min (ref >60)
GFR calc non Af Amer: >60 mL/min (ref >60)
Glucose, Bld: 133 mg/dL — ABNORMAL HIGH (ref 70–99)
Potassium: 3.3 mmol/L — ABNORMAL LOW (ref 3.5–5.1)
Sodium: 137 mmol/L (ref 135–145)

## 2019-08-09 LAB — GLUCOSE, CAPILLARY
Glucose-Capillary: 107 mg/dL — ABNORMAL HIGH (ref 70–99)
Glucose-Capillary: 255 mg/dL — ABNORMAL HIGH (ref 70–99)
Glucose-Capillary: 204 mg/dL — ABNORMAL HIGH (ref 70–99)
Glucose-Capillary: 202 mg/dL — ABNORMAL HIGH (ref 70–99)

## 2019-08-09 LAB — CBC
HCT: 42.8 % (ref 36.0–46.0)
Hemoglobin: 13.9 g/dL (ref 12.0–15.0)
MCH: 32 pg (ref 26.0–34.0)
MCHC: 32.5 g/dL (ref 30.0–36.0)
MCV: 98.4 fL (ref 80.0–100.0)
Platelets: 125 10*3/uL — ABNORMAL LOW (ref 150–400)
RBC: 4.35 MIL/uL (ref 3.87–5.11)
RDW: 19.1 % — ABNORMAL HIGH (ref 11.5–15.5)
WBC: 12.7 10*3/uL — ABNORMAL HIGH (ref 4.0–10.5)
nRBC: 0.4 % — ABNORMAL HIGH (ref 0.0–0.2)

## 2019-08-09 LAB — MAGNESIUM: Magnesium: 1.6 mg/dL — ABNORMAL LOW (ref 1.7–2.4)

## 2019-08-09 MED ORDER — MAGNESIUM SULFATE 2 GM/50ML IV SOLN
2.0000 g | Freq: Once | INTRAVENOUS | Status: AC
  Administered 2019-08-09: 2 g via INTRAVENOUS
  Filled 2019-08-09: qty 50

## 2019-08-09 MED ORDER — POTASSIUM CHLORIDE CRYS ER 20 MEQ PO TBCR
40.0000 meq | EXTENDED_RELEASE_TABLET | ORAL | Status: AC
  Administered 2019-08-09 (×2): 40 meq via ORAL
  Filled 2019-08-09 (×2): qty 2

## 2019-08-09 MED ORDER — GABAPENTIN 300 MG PO CAPS: 300.0000 mg | ORAL_CAPSULE | Freq: Three times a day (TID) | ORAL | Status: DC | PRN

## 2019-08-09 NOTE — Progress Notes (Signed)
PROGRESS NOTE    Nitika Jackowski  ERQ:[id number] DOB: 03/26/1951 DOA: 08/01/2019 PCP: Kerin Perna, NP     Brief Narrative:  Kathy Irwin is a 69 year old female with history of interstitial lung disease on oxygen at home, insulin-dependent diabetes mellitus type 2, hypertension, chronic thrombocytopenia, chronic diastolic CHF who presents to the emergency department with complaints of progressive exertional dyspnea, bilateral lower extremity swelling. On presentation she was saturating low 80s on room air.  Chest x-ray showed vascular congestion, interstitial edema.  Patient was admitted for the management of acute on chronic diastolic CHF exacerbation.  Started on IV Lasix.   New events last 24 hours / Subjective: Overall doing well, complains of a dry mouth and some tremors.  Assessment & Plan:   Principal Problem:   Acute on chronic diastolic CHF (congestive heart failure) (HCC) Active Problems:   Insulin-requiring or dependent type II diabetes mellitus (HCC)   Thrombocytopenia (HCC)   Interstitial lung disease (HCC)   Essential hypertension   Chronic respiratory failure with hypoxia (HCC)   Acute on chronic respiratory failure with hypoxemia (HCC)   Acute on chronic diastolic CHF -Presented with exertional dyspnea, bilateral lower extremity swelling.  Chest x-ray on admission showed vascular congestion, interstitial edema.  She is on Lasix, metolazone at home. Echocardiogram on 04/2019 showed ejection fraction of 60%.  -Repeat echocardiogram showed hyperdynamic LV, EF 70 to 75% without wall motion abnormality -Repeat chest x-ray reviewed independently 4/22, no focal consolidation, shows bilateral interstitial edema  -Continue IV Lasix.  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 Vaughan Browner -Uses 3 L nasal cannula O2 at baseline  Thrombocytopenia -Chronic. Follows with Dr. Irene Limbo. There was suspicion for CML but no evidence of BCR/ABL rearrangement as per previous records. Previous hospitalist discussed with Dr. Irene Limbo on phone and he recommended outpatient follow-up  -Platelet count improved  Hypertension -Continue Toprol  Hyperlipidemia -Continue Pravachol  GERD -Continue PPI  Depression/anxiety -Continue Zoloft  Insulin-dependent diabetes mellitus, poorly controlled -Hemoglobin A1c of 8.4% as per December 2020 -Lantus, sliding scale insulin  Morbid obesity -Estimated body mass index is 45.13 kg/m as calculated from the following:   Height as of this encounter: 5' 4" (1.626 m).   Weight as of this encounter: 119.3 kg.'  Hypokalemia -Replace, trend  Hypomagnesemia -Replace, trend  Tremors -Could be due to gabapentin, singulair  Dry mouth -Could be due to multiple medications including claritin, tramadol, zoloft, bentyl, robitussin, zanaflex     DVT prophylaxis: SCDs, due to thrombocytopenia Code Status: Full code Family Communication: No family at bedside Disposition Plan:   Status is: Inpatient  Remains inpatient appropriate because:IV treatments appropriate due to intensity of illness or inability to take PO   Dispo: The patient is from: Home              Anticipated d/c is to: Home              Anticipated d/c date is: 2 days              Patient currently is not medically stable to d/c. Continue IV lasix    Consultants:   None  Procedures:   None  Antimicrobials:  Anti-infectives (From admission, onward)   None  Objective: Vitals:   08/08/19 1100 08/08/19 1937 08/08/19 2121 08/09/19 0449  BP: 107/68 124/75  112/76  Pulse: 69 91  73  Resp: _0 Temp: 98.5 F (36.9 C) 99.2 F (37.3 C)  98.2 F (36.8 C)    TempSrc: Oral Oral  Oral  SpO2: 97% 91% 92% 100%  Weight:    119.3 kg  Height:        Intake/Output Summary (Last 24 hours) at 08/09/2019 1055 Last data filed at 08/09/2019 0522 Gross per 24 hour  Intake 240 ml  Output 1000 ml  Net -760 ml   Filed Weights   08/07/19 0354 08/08/19 0407 08/09/19 0449  Weight: 119.9 kg 121 kg 119.3 kg    Examination: General exam: Appears calm and comfortable  Respiratory system: Clear to auscultation. Respiratory effort normal. Cardiovascular system: S1 & S2 heard, RRR. +bilateral nonpitting pedal edema. Ted hose in place  Gastrointestinal system: Abdomen is nondistended, soft and nontender. Normal bowel sounds heard. Central nervous system: Alert and oriented. Non focal exam. Speech clear  Extremities: Symmetric in appearance bilaterally  Skin: No rashes, lesions or ulcers on exposed skin  Psychiatry: Judgement and insight appear stable. Mood & affect appropriate.    Data Reviewed: I have personally reviewed following labs and imaging studies  CBC: Recent Labs  Lab 08/02/19 1057 08/02/19 1057 08/03/19 0404 08/04/19 0513 08/05/19 0514 08/07/19 0627 08/09/19 0814  WBC 6.3   < > 5.4 4.1 6.2 7.7 12.7*  NEUTROABS 2.0  --  1.1* 1.6* 1.9  --   --   HGB 14.5   < > 13.7 14.1 13.5 13.6 13.9  HCT 47.2*   < > 43.9 44.9 43.0 42.8 42.8  MCV 102.4*   < > 101.6* 101.8* 101.7* 100.0 98.4  PLT 69*   < > 61* 46* 43* 48* 125*   < > = values in this interval not displayed.   Basic Metabolic Panel: Recent Labs  Lab 08/03/19 0404 08/03/19 0404 08/04/19 5271 08/04/19 2929 08/05/19 0514 08/06/19 0451 08/07/19 0627 08/08/19 0658 08/09/19 0533  NA 138   < > 139   < > 138 138 137 139 137  K 4.8   < > 3.9   < > 3.5 3.6 4.0 3.7 3.3*  CL 100   < > 99   < > 99 95* 95* 95* 92*  CO2 28   < > 29   < > 29 30 33* 34* 33*  GLUCOSE 196*   < > 172*   < > 128* 170* 164* 123* 133*  BUN 15   < > 22   < > _1 CREATININE 0.95   < > 0.94   < > 0.95  0.84 0.75 0.82 0.71  CALCIUM 9.4   < > 9.3   < > 8.8* 9.5 9.6 10.1 10.1  MG 1.6*  --  1.9  --   --  1.6* 1.8  --  1.6*   < > = values in this interval not displayed.   GFR: Estimated Creatinine Clearance: 84.3 mL/min (by C-G formula based on SCr of 0.71 mg/dL). Liver Function Tests: Recent Labs  Lab 08/02/19 1057 08/03/19 0404 08/04/19 0513  AST _2 ALT _3 ALKPHOS 68 57 59  BILITOT 1.5* 1.3* 1.5*  PROT 7.2 6.3* 6.5  ALBUMIN 3.8 3.7 3.7   No results for input(s): LIPASE, AMYLASE in the last 168 hours. No results for  input(s): AMMONIA in the last 168 hours. Coagulation Profile: No results for input(s): INR, PROTIME in the last 168 hours. Cardiac Enzymes: No results for input(s): CKTOTAL, CKMB, CKMBINDEX, TROPONINI in the last 168 hours. BNP (last 3 results) Recent Labs    03/03/19 1151  PROBNP 70.0   HbA1C: No results for input(s): HGBA1C in the last 72 hours. CBG: Recent Labs  Lab 08/08/19 0558 08/08/19 1107 08/08/19 1632 08/08/19 2106 08/09/19 0620  GLUCAP 144* 222* 160* 230* 107*   Lipid Profile: No results for input(s): CHOL, HDL, LDLCALC, TRIG, CHOLHDL, LDLDIRECT in the last 72 hours. Thyroid Function Tests: No results for input(s): TSH, T4TOTAL, FREET4, T3FREE, THYROIDAB in the last 72 hours. Anemia Panel: No results for input(s): VITAMINB12, FOLATE, FERRITIN, TIBC, IRON, RETICCTPCT in the last 72 hours. Sepsis Labs: No results for input(s): PROCALCITON, LATICACIDVEN in the last 168 hours.  Recent Results (from the past 240 hour(s))  SARS CORONAVIRUS 2 (TAT 6-24 HRS) Nasopharyngeal Nasopharyngeal Swab     Status: None   Collection Time: 08/02/19  3:00 AM   Specimen: Nasopharyngeal Swab  Result Value Ref Range Status   SARS Coronavirus 2 NEGATIVE NEGATIVE Final    Comment: (NOTE) SARS-CoV-2 target nucleic acids are NOT DETECTED. The SARS-CoV-2 RNA is generally detectable in upper and lower respiratory specimens during the acute phase of  infection. Negative results do not preclude SARS-CoV-2 infection, do not rule out co-infections with other pathogens, and should not be used as the sole basis for treatment or other patient management decisions. Negative results must be combined with clinical observations, patient history, and epidemiological information. The expected result is Negative. Fact Sheet for Patients: SugarRoll.be Fact Sheet for Healthcare Providers: https://www.woods-mathews.com/ This test is not yet approved or cleared by the [place] FDA and  has been authorized for detection and/or diagnosis of SARS-CoV-2 by FDA under an Emergency Use Authorization (EUA). This EUA will remain  in effect (meaning this test can be used) for the duration of the COVID-19 declaration under Section 56 4(b)(1) of the Act, 21 U.S.C. section 360bbb-3(b)(1), unless the authorization is terminated or revoked sooner. Performed at [facility], [place] [address] [place], [place] [address]   Respiratory Panel by PCR     Status: None   Collection Time: 08/06/19  2:32 PM   Specimen: Nasopharyngeal Swab; Respiratory  Result Value Ref Range Status   Adenovirus NOT DETECTED NOT DETECTED Final   Coronavirus 229E NOT DETECTED NOT DETECTED Final    Comment: (NOTE) The Coronavirus on the Respiratory Panel, DOES NOT test for the novel  Coronavirus (2019 nCoV)    Coronavirus HKU1 NOT DETECTED NOT DETECTED Final   Coronavirus NL63 NOT DETECTED NOT DETECTED Final   Coronavirus OC43 NOT DETECTED NOT DETECTED Final   Metapneumovirus NOT DETECTED NOT DETECTED Final   Rhinovirus / Enterovirus NOT DETECTED NOT DETECTED Final   Influenza A NOT DETECTED NOT DETECTED Final   Influenza B NOT DETECTED NOT DETECTED Final   Parainfluenza Virus 1 NOT DETECTED NOT DETECTED Final   Parainfluenza Virus 2 NOT DETECTED NOT DETECTED Final   Parainfluenza Virus 3 NOT DETECTED NOT DETECTED Final    Parainfluenza Virus 4 NOT DETECTED NOT DETECTED Final   Respiratory Syncytial Virus NOT DETECTED NOT DETECTED Final   Bordetella pertussis NOT DETECTED NOT DETECTED Final   Chlamydophila pneumoniae NOT DETECTED NOT DETECTED Final   Mycoplasma pneumoniae NOT DETECTED NOT DETECTED Final    Comment: Performed at [facility], [place].  [address], [place], [place] [address]      Radiology Studies: No results found.    Scheduled Meds: . dicyclomine  20 mg Oral TID AC & HS  . furosemide  60 mg Intravenous Q12H  . gabapentin  300 mg Oral TID  . insulin aspart  0-9 Units Subcutaneous TID WC  . insulin glargine  20 Units Subcutaneous BID  . loratadine  10 mg Oral Daily  . metoprolol  200 mg Oral Daily  . montelukast  10 mg Oral QHS  . pantoprazole  40 mg Oral Daily  . polyethylene glycol  17 g Oral Daily  . potassium chloride  40 mEq Oral Q4H  . pravastatin  40 mg Oral q1800  . sertraline  100 mg Oral Daily  . sodium chloride flush  3 mL Intravenous Q12H   Continuous Infusions: . sodium chloride 250 mL (08/06/19 1457)     LOS: 7 days      Time spent: 35 minutes   Dessa Phi, DO [facility] 08/09/2019, 10:55 AM   Available via Epic secure chat 7am-7pm After these hours, please refer to coverage provider listed on [email or web address]

## 2019-08-10 DIAGNOSIS — I5033 Acute on chronic diastolic (congestive) heart failure: Secondary | ICD-10-CM | POA: Diagnosis not present

## 2019-08-10 LAB — GLUCOSE, CAPILLARY
Glucose-Capillary: 172 mg/dL — ABNORMAL HIGH (ref 70–99)
Glucose-Capillary: 256 mg/dL — ABNORMAL HIGH (ref 70–99)
Glucose-Capillary: 283 mg/dL — ABNORMAL HIGH (ref 70–99)
Glucose-Capillary: 148 mg/dL — ABNORMAL HIGH (ref 70–99)

## 2019-08-10 LAB — CBC
HCT: 41.6 % (ref 36.0–46.0)
Hemoglobin: 13.4 g/dL (ref 12.0–15.0)
MCH: 31.6 pg (ref 26.0–34.0)
MCHC: 32.2 g/dL (ref 30.0–36.0)
MCV: 98.1 fL (ref 80.0–100.0)
Platelets: 138 10*3/uL — ABNORMAL LOW (ref 150–400)
RBC: 4.24 MIL/uL (ref 3.87–5.11)
RDW: 18.6 % — ABNORMAL HIGH (ref 11.5–15.5)
WBC: 12.9 10*3/uL — ABNORMAL HIGH (ref 4.0–10.5)
nRBC: 0.3 % — ABNORMAL HIGH (ref 0.0–0.2)

## 2019-08-10 LAB — BASIC METABOLIC PANEL
Anion gap: 15 (ref 5–15)
BUN: 20 mg/dL (ref 8–23)
CO2: 32 mmol/L (ref 22–32)
Calcium: 10.3 mg/dL (ref 8.9–10.3)
Chloride: 89 mmol/L — ABNORMAL LOW (ref 98–111)
Creatinine, Ser: 0.86 mg/dL (ref 0.44–1.00)
GFR calc Af Amer: >60 mL/min (ref >60)
GFR calc non Af Amer: >60 mL/min (ref >60)
Glucose, Bld: 214 mg/dL — ABNORMAL HIGH (ref 70–99)
Potassium: 3.4 mmol/L — ABNORMAL LOW (ref 3.5–5.1)
Sodium: 136 mmol/L (ref 135–145)

## 2019-08-10 LAB — MAGNESIUM: Magnesium: 1.7 mg/dL (ref 1.7–2.4)

## 2019-08-10 MED ORDER — FUROSEMIDE 10 MG/ML IJ SOLN
60.0000 mg | Freq: Two times a day (BID) | INTRAMUSCULAR | Status: DC
  Administered 2019-08-10 – 2019-08-11 (×2): 60 mg via INTRAVENOUS
  Filled 2019-08-10 (×2): qty 6

## 2019-08-10 MED ORDER — METOLAZONE 2.5 MG PO TABS
2.5000 mg | ORAL_TABLET | Freq: Once | ORAL | Status: AC
  Administered 2019-08-10: 2.5 mg via ORAL
  Filled 2019-08-10: qty 1

## 2019-08-10 MED ORDER — POTASSIUM CHLORIDE CRYS ER 20 MEQ PO TBCR
40.0000 meq | EXTENDED_RELEASE_TABLET | Freq: Once | ORAL | Status: AC
  Administered 2019-08-10: 40 meq via ORAL
  Filled 2019-08-10: qty 2

## 2019-08-10 MED ORDER — MAGNESIUM SULFATE 2 GM/50ML IV SOLN
2.0000 g | Freq: Once | INTRAVENOUS | Status: AC
  Administered 2019-08-10: 2 g via INTRAVENOUS
  Filled 2019-08-10: qty 50

## 2019-08-10 MED ORDER — DICYCLOMINE HCL 20 MG PO TABS
20.0000 mg | ORAL_TABLET | Freq: Four times a day (QID) | ORAL | Status: DC | PRN
  Filled 2019-08-10: qty 1

## 2019-08-10 MED ORDER — POLYETHYLENE GLYCOL 3350 17 G PO PACK: 17.0000 g | PACK | Freq: Every day | ORAL | Status: DC | PRN

## 2019-08-10 NOTE — Plan of Care (Signed)

## 2019-08-10 NOTE — Progress Notes (Signed)
PROGRESS NOTE    Kathy Irwin  OFB:[id number] DOB: 01/12/1951 DOA: 08/01/2019 PCP: Kerin Perna, NP     Brief Narrative:  Kathy Irwin is a 68 year old female with history of interstitial lung disease on oxygen at home, insulin-dependent diabetes mellitus type 2, hypertension, chronic thrombocytopenia, chronic diastolic CHF who presents to the emergency department with complaints of progressive exertional dyspnea, bilateral lower extremity swelling. On presentation she was saturating low 80s on room air.  Chest x-ray showed vascular congestion, interstitial edema.  Patient was admitted for the management of acute on chronic diastolic CHF exacerbation.  Started on IV Lasix.   New events last 24 hours / Subjective: No new complaints other than continued productive cough  Assessment & Plan:   Principal Problem:   Acute on chronic diastolic CHF (congestive heart failure) (HCC) Active Problems:   Insulin-requiring or dependent type II diabetes mellitus (HCC)   Thrombocytopenia (HCC)   Interstitial lung disease ([place])   Essential hypertension   Chronic respiratory failure with hypoxia (HCC)   Acute on chronic respiratory failure with hypoxemia (HCC)   Acute on chronic diastolic CHF -Presented with exertional dyspnea, bilateral lower extremity swelling.  Chest x-ray on admission showed vascular congestion, interstitial edema.  She is on Lasix, metolazone at home. Echocardiogram on 04/2019 showed ejection fraction of 60%.  -Repeat echocardiogram showed hyperdynamic LV, EF 70 to 75% without wall motion abnormality -Repeat chest x-ray reviewed independently 4/22, no focal consolidation, shows bilateral interstitial edema  -Continue IV Lasix, metolazone x1 today -Strict I/Os, daily weight -Ted hose ordered  -Net -3.6L since admission, although not 100% accurately captured UOP   Fever with productive cough -Respiratory viral panel negative -Cough syrup ordered as needed -Now afebrile,  no sign of pneumonia on CXR   Chronic hypoxemic respiratory failure secondary to hypersensitivity pneumonitis/ILD -Follows with Dr. Vaughan Browner -Uses 3 L nasal cannula O2 at baseline  Thrombocytopenia -Chronic. Follows with Dr. Irene Limbo. There was suspicion for CML but no evidence of BCR/ABL rearrangement as per previous records. Previous hospitalist discussed with Dr. Irene Limbo on phone and he recommended outpatient follow-up  -Platelet count improved  Hypertension -Continue Toprol  Hyperlipidemia -Continue Pravachol  GERD -Continue PPI  Depression/anxiety -Continue Zoloft  Insulin-dependent diabetes mellitus, poorly controlled -Hemoglobin A1c of 8.4% as per December 2020 -Lantus, sliding scale insulin  Morbid obesity -Estimated body mass index is 45.16 kg/m as calculated from the following:   Height as of this encounter: _0  (1.626 m).   Weight as of this encounter: 119.3 kg.'  Hypokalemia -Replace, trend  Hypomagnesemia  -Replace, trend  Tremors -Could be due to gabapentin, singulair  Dry mouth -Could be due to multiple medications including claritin, tramadol, zoloft, bentyl, robitussin, zanaflex     DVT prophylaxis: SCDs, due to thrombocytopenia Code Status: Full code Family Communication: No family at bedside Disposition Plan:   Status is: Inpatient  Remains inpatient appropriate because:IV treatments appropriate due to intensity of illness or inability to take PO   Dispo: The patient is from: Home              Anticipated d/c is to: Home              Anticipated d/c date is: 1 day              Patient currently is not medically stable to d/c. Continue IV lasix, add metolazone today. Hopeful dc home 4/26    Consultants:   None  Procedures:   None  Antimicrobials:  Anti-infectives (From admission, onward)   None       Objective: Vitals:   08/09/19 0449 08/09/19 1615 08/09/19 2125 08/10/19 0514  BP: 112/76 (!) 102/50 132/73 118/78  Pulse: 73  66 64 70  Resp: _0 Temp: 98.2 F (36.8 C) 97.9 F (36.6 C) 97.6 F (36.4 C) 98.7 F (37.1 C)  TempSrc: Oral Oral Oral Oral  SpO2: 100% 97% 96% 100%  Weight: 119.3 kg   119.3 kg  Height:        Intake/Output Summary (Last 24 hours) at 08/10/2019 1011 Last data filed at 08/10/2019 0600 Gross per 24 hour  Intake 480 ml  Output 1500 ml  Net -1020 ml   Filed Weights   08/08/19 0407 08/09/19 0449 08/10/19 0514  Weight: 121 kg 119.3 kg 119.3 kg    Examination: General exam: Appears calm and comfortable  Respiratory system: Clear to auscultation. Respiratory effort normal.  On room air Cardiovascular system: S1 & S2 heard, RRR. + Bilateral pedal edema, TED hose in place. Gastrointestinal system: Abdomen is nondistended, soft and nontender. Normal bowel sounds heard. Central nervous system: Alert and oriented. Non focal exam. Speech clear  Extremities: Symmetric in appearance bilaterally  Skin: No rashes, lesions or ulcers on exposed skin  Psychiatry: Judgement and insight appear stable. Mood & affect appropriate.    Data Reviewed: I have personally reviewed following labs and imaging studies  CBC: Recent Labs  Lab 08/04/19 0513 08/05/19 0514 08/07/19 0627 08/09/19 0814 08/10/19 0707  WBC 4.1 6.2 7.7 12.7* 12.9*  NEUTROABS 1.6* 1.9  --   --   --   HGB 14.1 13.5 13.6 13.9 13.4  HCT 44.9 43.0 42.8 42.8 41.6  MCV 101.8* 101.7* 100.0 98.4 98.1  PLT 46* 43* 48* 125* 062*   Basic Metabolic Panel: Recent Labs  Lab 08/04/19 0513 08/05/19 0514 08/06/19 0451 08/07/19 0627 08/08/19 0658 08/09/19 0533 08/10/19 0707  NA 139   < > 138 137 139 137 136  K 3.9   < > 3.6 4.0 3.7 3.3* 3.4*  CL 99   < > 95* 95* 95* 92* 89*  CO2 29   < > 30 33* 34* 33* 32  GLUCOSE 172*   < > 170* 164* 123* 133* 214*  BUN 22   < > _1 CREATININE 0.94   < > 0.84 0.75 0.82 0.71 0.86  CALCIUM 9.3   < > 9.5 9.6 10.1 10.1 10.3  MG 1.9  --  1.6* 1.8  --  1.6* 1.7   < > = values  in this interval not displayed.   GFR: Estimated Creatinine Clearance: 78.5 mL/min (by C-G formula based on SCr of 0.86 mg/dL). Liver Function Tests: Recent Labs  Lab 08/04/19 0513  AST 20  ALT 15  ALKPHOS 59  BILITOT 1.5*  PROT 6.5  ALBUMIN 3.7   No results for input(s): LIPASE, AMYLASE in the last 168 hours. No results for input(s): AMMONIA in the last 168 hours. Coagulation Profile: No results for input(s): INR, PROTIME in the last 168 hours. Cardiac Enzymes: No results for input(s): CKTOTAL, CKMB, CKMBINDEX, TROPONINI in the last 168 hours. BNP (last 3 results) Recent Labs    03/03/19 1151  PROBNP 70.0   HbA1C: No results for input(s): HGBA1C in the last 72 hours. CBG: Recent Labs  Lab 08/09/19 0620 08/09/19 1113 08/09/19 1610 08/09/19 2126 08/10/19 0614  GLUCAP 107* 255* 204* 202* 172*   Lipid Profile:  No results for input(s): CHOL, HDL, LDLCALC, TRIG, CHOLHDL, LDLDIRECT in the last 72 hours. Thyroid Function Tests: No results for input(s): TSH, T4TOTAL, FREET4, T3FREE, THYROIDAB in the last 72 hours. Anemia Panel: No results for input(s): VITAMINB12, FOLATE, FERRITIN, TIBC, IRON, RETICCTPCT in the last 72 hours. Sepsis Labs: No results for input(s): PROCALCITON, LATICACIDVEN in the last 168 hours.  Recent Results (from the past 240 hour(s))  SARS CORONAVIRUS 2 (TAT 6-24 HRS) Nasopharyngeal Nasopharyngeal Swab     Status: None   Collection Time: 08/02/19  3:00 AM   Specimen: Nasopharyngeal Swab  Result Value Ref Range Status   SARS Coronavirus 2 NEGATIVE NEGATIVE Final    Comment: (NOTE) SARS-CoV-2 target nucleic acids are NOT DETECTED. The SARS-CoV-2 RNA is generally detectable in upper and lower respiratory specimens during the acute phase of infection. Negative results do not preclude SARS-CoV-2 infection, do not rule out co-infections with other pathogens, and should not be used as the sole basis for treatment or other patient management  decisions. Negative results must be combined with clinical observations, patient history, and epidemiological information. The expected result is Negative. Fact Sheet for Patients: SugarRoll.be Fact Sheet for Healthcare Providers: https://www.woods-mathews.com/ This test is not yet approved or cleared by the [place] FDA and  has been authorized for detection and/or diagnosis of SARS-CoV-2 by FDA under an Emergency Use Authorization (EUA). This EUA will remain  in effect (meaning this test can be used) for the duration of the COVID-19 declaration under Section 56 4(b)(1) of the Act, 21 U.S.C. section 360bbb-3(b)(1), unless the authorization is terminated or revoked sooner. Performed at [facility], [place] [address], [place], [place] [address]   Respiratory Panel by PCR     Status: None   Collection Time: 08/06/19  2:32 PM   Specimen: Nasopharyngeal Swab; Respiratory  Result Value Ref Range Status   Adenovirus NOT DETECTED NOT DETECTED Final   Coronavirus 229E NOT DETECTED NOT DETECTED Final    Comment: (NOTE) The Coronavirus on the Respiratory Panel, DOES NOT test for the novel  Coronavirus (2019 nCoV)    Coronavirus HKU1 NOT DETECTED NOT DETECTED Final   Coronavirus NL63 NOT DETECTED NOT DETECTED Final   Coronavirus OC43 NOT DETECTED NOT DETECTED Final   Metapneumovirus NOT DETECTED NOT DETECTED Final   Rhinovirus / Enterovirus NOT DETECTED NOT DETECTED Final   Influenza A NOT DETECTED NOT DETECTED Final   Influenza B NOT DETECTED NOT DETECTED Final   Parainfluenza Virus 1 NOT DETECTED NOT DETECTED Final   Parainfluenza Virus 2 NOT DETECTED NOT DETECTED Final   Parainfluenza Virus 3 NOT DETECTED NOT DETECTED Final   Parainfluenza Virus 4 NOT DETECTED NOT DETECTED Final   Respiratory Syncytial Virus NOT DETECTED NOT DETECTED Final   Bordetella pertussis NOT DETECTED NOT DETECTED Final   Chlamydophila pneumoniae NOT DETECTED  NOT DETECTED Final   Mycoplasma pneumoniae NOT DETECTED NOT DETECTED Final    Comment: Performed at [facility], [place]. [address] [place], [place] [address]      Radiology Studies: No results found.    Scheduled Meds: . dicyclomine  20 mg Oral TID AC & HS  . furosemide  60 mg Intravenous Q12H  . insulin aspart  0-9 Units Subcutaneous TID WC  . insulin glargine  20 Units Subcutaneous BID  . loratadine  10 mg Oral Daily  . metoprolol  200 mg Oral Daily  . montelukast  10 mg Oral QHS  . pantoprazole  40 mg Oral  Daily  . polyethylene glycol  17 g Oral Daily  . pravastatin  40 mg Oral q1800  . sertraline  100 mg Oral Daily  . sodium chloride flush  3 mL Intravenous Q12H   Continuous Infusions: . sodium chloride 250 mL (08/06/19 1457)     LOS: 8 days      Time spent: 25 minutes   Dessa Phi, DO [facility] 08/10/2019, 10:11 AM   Available via Epic secure chat 7am-7pm After these hours, please refer to coverage provider listed on [email or web address]

## 2019-08-11 ENCOUNTER — Inpatient Hospital Stay (HOSPITAL_COMMUNITY): Payer: Medicare Other

## 2019-08-11 DIAGNOSIS — I5033 Acute on chronic diastolic (congestive) heart failure: Secondary | ICD-10-CM | POA: Diagnosis not present

## 2019-08-11 LAB — BASIC METABOLIC PANEL
Anion gap: 13 (ref 5–15)
BUN: 18 mg/dL (ref 8–23)
CO2: 36 mmol/L — ABNORMAL HIGH (ref 22–32)
Calcium: 10.5 mg/dL — ABNORMAL HIGH (ref 8.9–10.3)
Chloride: 87 mmol/L — ABNORMAL LOW (ref 98–111)
Creatinine, Ser: 0.76 mg/dL (ref 0.44–1.00)
GFR calc Af Amer: >60 mL/min (ref >60)
GFR calc non Af Amer: >60 mL/min (ref >60)
Glucose, Bld: 150 mg/dL — ABNORMAL HIGH (ref 70–99)
Potassium: 2.8 mmol/L — ABNORMAL LOW (ref 3.5–5.1)
Sodium: 136 mmol/L (ref 135–145)

## 2019-08-11 LAB — GLUCOSE, CAPILLARY
Glucose-Capillary: 154 mg/dL — ABNORMAL HIGH (ref 70–99)
Glucose-Capillary: 193 mg/dL — ABNORMAL HIGH (ref 70–99)
Glucose-Capillary: 408 mg/dL — ABNORMAL HIGH (ref 70–99)
Glucose-Capillary: 309 mg/dL — ABNORMAL HIGH (ref 70–99)

## 2019-08-11 LAB — MAGNESIUM: Magnesium: 1.7 mg/dL (ref 1.7–2.4)

## 2019-08-11 LAB — CBC
HCT: 42.3 % (ref 36.0–46.0)
Hemoglobin: 13.5 g/dL (ref 12.0–15.0)
MCH: 30.8 pg (ref 26.0–34.0)
MCHC: 31.9 g/dL (ref 30.0–36.0)
MCV: 96.6 fL (ref 80.0–100.0)
Platelets: 164 10*3/uL (ref 150–400)
RBC: 4.38 MIL/uL (ref 3.87–5.11)
RDW: 18.3 % — ABNORMAL HIGH (ref 11.5–15.5)
WBC: 14.7 10*3/uL — ABNORMAL HIGH (ref 4.0–10.5)
nRBC: 0.3 % — ABNORMAL HIGH (ref 0.0–0.2)

## 2019-08-11 MED ORDER — FUROSEMIDE 40 MG PO TABS
40.0000 mg | ORAL_TABLET | Freq: Every day | ORAL | Status: DC
  Administered 2019-08-12: 40 mg via ORAL
  Filled 2019-08-11: qty 1

## 2019-08-11 MED ORDER — SALINE SPRAY 0.65 % NA SOLN
1.0000 | NASAL | Status: DC | PRN
  Administered 2019-08-11 (×2): 1 via NASAL
  Filled 2019-08-11: qty 44

## 2019-08-11 MED ORDER — DOXYCYCLINE HYCLATE 100 MG PO TABS
100.0000 mg | ORAL_TABLET | Freq: Two times a day (BID) | ORAL | Status: DC
  Administered 2019-08-11 – 2019-08-12 (×3): 100 mg via ORAL
  Filled 2019-08-11 (×3): qty 1

## 2019-08-11 MED ORDER — INSULIN ASPART 100 UNIT/ML ~~LOC~~ SOLN
4.0000 [IU] | Freq: Three times a day (TID) | SUBCUTANEOUS | Status: DC
  Administered 2019-08-11 – 2019-08-12 (×2): 4 [IU] via SUBCUTANEOUS

## 2019-08-11 MED ORDER — INSULIN ASPART 100 UNIT/ML ~~LOC~~ SOLN
3.0000 [IU] | Freq: Once | SUBCUTANEOUS | Status: AC
  Administered 2019-08-11: 3 [IU] via SUBCUTANEOUS

## 2019-08-11 MED ORDER — PREDNISONE 50 MG PO TABS: 50.0000 mg | ORAL_TABLET | Freq: Every day | ORAL | Status: DC

## 2019-08-11 MED ORDER — PREDNISONE 50 MG PO TABS
50.0000 mg | ORAL_TABLET | Freq: Every day | ORAL | Status: DC
  Administered 2019-08-11 – 2019-08-12 (×2): 50 mg via ORAL
  Filled 2019-08-11 (×2): qty 1

## 2019-08-11 MED ORDER — POTASSIUM CHLORIDE CRYS ER 20 MEQ PO TBCR
40.0000 meq | EXTENDED_RELEASE_TABLET | ORAL | Status: AC
  Administered 2019-08-11 (×3): 40 meq via ORAL
  Filled 2019-08-11 (×3): qty 2

## 2019-08-11 NOTE — Care Management Important Message (Signed)
Important Message  Patient Details  Name: Kathy Irwin MRN: [id number] Date of Birth: 1950-10-12   Medicare Important Message Given:  Yes     Shelda Altes 08/11/2019, 8:46 AM

## 2019-08-11 NOTE — Progress Notes (Signed)
PROGRESS NOTE    Kathy Irwin  ZOX:[id number] DOB: 09/18/1950 DOA: 08/01/2019 PCP: Kerin Perna, NP     Brief Narrative:  Kathy Irwin is a 69 year old female with history of interstitial lung disease on oxygen at home, insulin-dependent diabetes mellitus type 2, hypertension, chronic thrombocytopenia, chronic diastolic CHF who presents to the emergency department with complaints of progressive exertional dyspnea, bilateral lower extremity swelling. On presentation she was saturating low 80s on room air.  Chest x-ray showed vascular congestion, interstitial edema.  Patient was admitted for the management of acute on chronic diastolic CHF exacerbation.  Started on IV Lasix.   New events last 24 hours / Subjective: States that due to her persistent productive cough, she has not slept in several days. Swelling of LEs improved.   Assessment & Plan:   Principal Problem:   Acute on chronic diastolic CHF (congestive heart failure) (HCC) Active Problems:   Insulin-requiring or dependent type II diabetes mellitus (HCC)   Thrombocytopenia (HCC)   Interstitial lung disease (HCC)   Essential hypertension   Chronic respiratory failure with hypoxia (HCC)   Acute on chronic respiratory failure with hypoxemia (HCC)   Acute on chronic diastolic CHF -Presented with exertional dyspnea, bilateral lower extremity swelling.  Chest x-ray on admission showed vascular congestion, interstitial edema.  She is on Lasix, metolazone at home. Echocardiogram on 04/2019 showed ejection fraction of 60%.  -Repeat echocardiogram showed hyperdynamic LV, EF 70 to 75% without wall motion abnormality -Repeat chest x-ray reviewed independently 4/22, no focal consolidation, shows bilateral interstitial edema  -Repeat chest x-ray reviewed independently 4/26, air bronchogram, pulmonary congestion -Strict I/Os, daily weight -Ted hose ordered  -Net -4.6L since admission, although not 100% accurately captured UOP  -Will  deescalate IV lasix to PO tomorrow   Fever with productive cough -Respiratory viral panel negative -Cough syrup ordered as needed -Now afebrile, no sign of pneumonia on CXR   -?Bronchitis with increase in productive cough, increase in WBC. 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 Vaughan Browner -Uses 3 L nasal cannula O2 at baseline  Thrombocytopenia -Chronic. Follows with Dr. Irene Limbo. There was suspicion for CML but no evidence of BCR/ABL rearrangement as per previous records. Previous hospitalist discussed with Dr. Irene Limbo on phone and he recommended outpatient follow-up  -Improved   Hypertension -Continue Toprol  Hyperlipidemia -Continue Pravachol  GERD -Continue PPI  Depression/anxiety -Continue Zoloft  Insulin-dependent diabetes mellitus, poorly controlled -Hemoglobin A1c of 8.4% as per December 2020 -Lantus, sliding scale insulin  Morbid obesity -Estimated body mass index is 44.83 kg/m as calculated from the following:   Height as of this encounter: _0  (1.626 m).   Weight as of this encounter: 118.5 kg.'  Hypokalemia -Replace, trend  Tremors -Could be due to gabapentin, singulair  Dry mouth -Could be due to multiple medications including claritin, tramadol, zoloft, bentyl, robitussin, zanaflex     DVT prophylaxis: SCDs, due to thrombocytopenia Code Status: Full code Family Communication: No family at bedside Disposition Plan:   Status is: Inpatient  Remains inpatient appropriate because:IV treatments appropriate due to intensity of illness or inability to take PO   Dispo: The patient is from: Home              Anticipated d/c is to: Home              Anticipated d/c date is: 1 day  Patient currently is not medically stable to d/c. Add prednisone and doxy today. If improvement in cough and symptoms, hopeful discharge home 4/27. Replace K for  severe hypokalemia on lasix and zaroxolyn     Consultants:   None  Procedures:   None  Antimicrobials:  Anti-infectives (From admission, onward)   Start     Dose/Rate Route Frequency Ordered Stop   08/11/19 1030  doxycycline (VIBRA-TABS) tablet 100 mg     100 mg Oral Every 12 hours 08/11/19 0934         Objective: Vitals:   08/10/19 1644 08/10/19 2036 08/11/19 0325 08/11/19 0400  BP: 131/63 125/76 132/76   Pulse: 77 76 75   Resp: 20 19    Temp: 99.2 F (37.3 C) 98.4 F (36.9 C) 97.7 F (36.5 C)   TempSrc: Oral Oral Oral   SpO2: 100% 95% (!) 89%   Weight:    118.5 kg  Height:        Intake/Output Summary (Last 24 hours) at 08/11/2019 0934 Last data filed at 08/11/2019 0900 Gross per 24 hour  Intake 1623.51 ml  Output 2676 ml  Net -1052.49 ml   Filed Weights   08/09/19 0449 08/10/19 0514 08/11/19 0400  Weight: 119.3 kg 119.3 kg 118.5 kg    Examination: General exam: Appears calm and comfortable  Respiratory system: Mild rhonchi bilaterally. Respiratory effort normal. Cardiovascular system: S1 & S2 heard, RRR. +pedal edema, nonpitting and improved, ted hose in place. Gastrointestinal system: Abdomen is nondistended, soft and nontender. Normal bowel sounds heard. Central nervous system: Alert and oriented. Non focal exam. Speech clear  Extremities: Symmetric in appearance bilaterally  Skin: No rashes, lesions or ulcers on exposed skin  Psychiatry: Judgement and insight appear stable. Mood & affect appropriate.    Data Reviewed: I have personally reviewed following labs and imaging studies  CBC: Recent Labs  Lab 08/05/19 0514 08/07/19 0627 08/09/19 0814 08/10/19 0707 08/11/19 0512  WBC 6.2 7.7 12.7* 12.9* 14.7*  NEUTROABS 1.9  --   --   --   --   HGB 13.5 13.6 13.9 13.4 13.5  HCT 43.0 42.8 42.8 41.6 42.3  MCV 101.7* 100.0 98.4 98.1 96.6  PLT 43* 48* 125* 138* 147   Basic Metabolic Panel: Recent Labs  Lab 08/06/19 0451 08/06/19 0451  08/07/19 0627 08/08/19 0658 08/09/19 0533 08/10/19 0707 08/11/19 0512  NA 138   < > 137 139 137 136 136  K 3.6   < > 4.0 3.7 3.3* 3.4* 2.8*  CL 95*   < > 95* 95* 92* 89* 87*  CO2 30   < > 33* 34* 33* 32 36*  GLUCOSE 170*   < > 164* 123* 133* 214* 150*  BUN 13   < > _0 CREATININE 0.84   < > 0.75 0.82 0.71 0.86 0.76  CALCIUM 9.5   < > 9.6 10.1 10.1 10.3 10.5*  MG 1.6*  --  1.8  --  1.6* 1.7 1.7   < > = values in this interval not displayed.   GFR: Estimated Creatinine Clearance: 84 mL/min (by C-G formula based on SCr of 0.76 mg/dL). Liver Function Tests: No results for input(s): AST, ALT, ALKPHOS, BILITOT, PROT, ALBUMIN in the last 168 hours. No results for input(s): LIPASE, AMYLASE in the last 168 hours. No results for input(s): AMMONIA in the last 168 hours. Coagulation Profile: No results for input(s): INR, PROTIME in the last 168 hours. Cardiac Enzymes: No  results for input(s): CKTOTAL, CKMB, CKMBINDEX, TROPONINI in the last 168 hours. BNP (last 3 results) Recent Labs    03/03/19 1151  PROBNP 70.0   HbA1C: No results for input(s): HGBA1C in the last 72 hours. CBG: Recent Labs  Lab 08/10/19 0614 08/10/19 1137 08/10/19 1641 08/10/19 2148 08/11/19 0647  GLUCAP 172* 256* 283* 148* 154*   Lipid Profile: No results for input(s): CHOL, HDL, LDLCALC, TRIG, CHOLHDL, LDLDIRECT in the last 72 hours. Thyroid Function Tests: No results for input(s): TSH, T4TOTAL, FREET4, T3FREE, THYROIDAB in the last 72 hours. Anemia Panel: No results for input(s): VITAMINB12, FOLATE, FERRITIN, TIBC, IRON, RETICCTPCT in the last 72 hours. Sepsis Labs: No results for input(s): PROCALCITON, LATICACIDVEN in the last 168 hours.  Recent Results (from the past 240 hour(s))  SARS CORONAVIRUS 2 (TAT 6-24 HRS) Nasopharyngeal Nasopharyngeal Swab     Status: None   Collection Time: 08/02/19  3:00 AM   Specimen: Nasopharyngeal Swab  Result Value Ref Range Status   SARS Coronavirus 2  NEGATIVE NEGATIVE Final    Comment: (NOTE) SARS-CoV-2 target nucleic acids are NOT DETECTED. The SARS-CoV-2 RNA is generally detectable in upper and lower respiratory specimens during the acute phase of infection. Negative results do not preclude SARS-CoV-2 infection, do not rule out co-infections with other pathogens, and should not be used as the sole basis for treatment or other patient management decisions. Negative results must be combined with clinical observations, patient history, and epidemiological information. The expected result is Negative. Fact Sheet for Patients: SugarRoll.be Fact Sheet for Healthcare Providers: https://www.woods-mathews.com/ This test is not yet approved or cleared by the [place] FDA and  has been authorized for detection and/or diagnosis of SARS-CoV-2 by FDA under an Emergency Use Authorization (EUA). This EUA will remain  in effect (meaning this test can be used) for the duration of the COVID-19 declaration under Section 56 4(b)(1) of the Act, 21 U.S.C. section 360bbb-3(b)(1), unless the authorization is terminated or revoked sooner. Performed at [facility], [place] [address], [place], [place] [address]   Respiratory Panel by PCR     Status: None   Collection Time: 08/06/19  2:32 PM   Specimen: Nasopharyngeal Swab; Respiratory  Result Value Ref Range Status   Adenovirus NOT DETECTED NOT DETECTED Final   Coronavirus 229E NOT DETECTED NOT DETECTED Final    Comment: (NOTE) The Coronavirus on the Respiratory Panel, DOES NOT test for the novel  Coronavirus (2019 nCoV)    Coronavirus HKU1 NOT DETECTED NOT DETECTED Final   Coronavirus NL63 NOT DETECTED NOT DETECTED Final   Coronavirus OC43 NOT DETECTED NOT DETECTED Final   Metapneumovirus NOT DETECTED NOT DETECTED Final   Rhinovirus / Enterovirus NOT DETECTED NOT DETECTED Final   Influenza A NOT DETECTED NOT DETECTED Final   Influenza B NOT  DETECTED NOT DETECTED Final   Parainfluenza Virus 1 NOT DETECTED NOT DETECTED Final   Parainfluenza Virus 2 NOT DETECTED NOT DETECTED Final   Parainfluenza Virus 3 NOT DETECTED NOT DETECTED Final   Parainfluenza Virus 4 NOT DETECTED NOT DETECTED Final   Respiratory Syncytial Virus NOT DETECTED NOT DETECTED Final   Bordetella pertussis NOT DETECTED NOT DETECTED Final   Chlamydophila pneumoniae NOT DETECTED NOT DETECTED Final   Mycoplasma pneumoniae NOT DETECTED NOT DETECTED Final    Comment: Performed at [facility], [place]. [address] [place], [place] [address]      Radiology Studies: DG Chest 2 View  Result Date: 08/11/2019 CLINICAL DATA:  69 year old female with cough and congestion. EXAM: CHEST - 2 VIEW COMPARISON:  Portable chest 08/07/2019 and earlier. FINDINGS: PA and lateral views. Stable lung volumes. Stable mild cardiomegaly. Visualized tracheal air column is within normal limits. Continued diffuse increased pulmonary interstitial opacity, with no pleural fluid evident. No pneumothorax or consolidation. Overall ventilation has not significantly changed. No acute osseous abnormality identified. Negative visible bowel gas pattern. IMPRESSION: 1. Ventilation not significantly changed from 08/01/2019 with diffuse basilar predominant interstitial markings which are favored due to interstitial lung disease (as suspected by high-resolution CT on 02/11/2019) rather than interstitial edema (no definite pleural fluid). 2. No new cardiopulmonary abnormality. Electronically Signed   By: Genevie Ann M.D.   On: 08/11/2019 08:39      Scheduled Meds: . doxycycline  100 mg Oral Q12H  . furosemide  60 mg Intravenous BID  . insulin aspart  0-9 Units Subcutaneous TID WC  . insulin glargine  20 Units Subcutaneous BID  . metoprolol  200 mg Oral Daily  . pantoprazole  40 mg Oral Daily  . potassium chloride  40 mEq Oral Q4H  . pravastatin  40 mg Oral q1800  . [START ON 08/12/2019] predniSONE  50 mg  Oral Q breakfast  . sertraline  100 mg Oral Daily  . sodium chloride flush  3 mL Intravenous Q12H   Continuous Infusions: . sodium chloride 250 mL (08/06/19 1457)     LOS: 9 days      Time spent: 25 minutes   Dessa Phi, DO [facility] 08/11/2019, 9:34 AM   Available via Epic secure chat 7am-7pm After these hours, please refer to coverage provider listed on [email or web address]

## 2019-08-11 NOTE — Progress Notes (Signed)
Inpatient Diabetes Program Recommendations  AACE/ADA: New Consensus Statement on Inpatient Glycemic Control (2015)  Target Ranges:  Prepandial:   less than 140 mg/dL      Peak postprandial:   less than 180 mg/dL (1-2 hours)      Critically ill patients:  140 - 180 mg/dL   Lab Results  Component Value Date   GLUCAP 154 (H) 08/11/2019   HGBA1C 8.0 (H) 08/02/2019    Review of Glycemic Control  Diabetes history: DM 2 Outpatient Diabetes medications: Amaryl 2 mg Daily, Humalog 75/25 50 units qam, 30 units qhs Current orders for Inpatient glycemic control:  Lantus 20 units bid Novolog 0-9 units tid  PO prednisone 50 mg Daily A1c 8% on 4/17  Inpatient Diabetes Program Recommendations:    Consider adding Novolog 4 units tid meal coverage. Glucose trends increase after meals.  Thanks,  Tama Headings RN, MSN, BC-ADM Inpatient Diabetes Coordinator Team Pager [phone] (8a-5p)

## 2019-08-12 DIAGNOSIS — I5033 Acute on chronic diastolic (congestive) heart failure: Secondary | ICD-10-CM | POA: Diagnosis not present

## 2019-08-12 LAB — CBC
HCT: 41.7 % (ref 36.0–46.0)
Hemoglobin: 13.5 g/dL (ref 12.0–15.0)
MCH: 31.1 pg (ref 26.0–34.0)
MCHC: 32.4 g/dL (ref 30.0–36.0)
MCV: 96.1 fL (ref 80.0–100.0)
Platelets: 185 10*3/uL (ref 150–400)
RBC: 4.34 MIL/uL (ref 3.87–5.11)
RDW: 18 % — ABNORMAL HIGH (ref 11.5–15.5)
WBC: 16.9 10*3/uL — ABNORMAL HIGH (ref 4.0–10.5)
nRBC: 0.2 % (ref 0.0–0.2)

## 2019-08-12 LAB — BASIC METABOLIC PANEL
Anion gap: 12 (ref 5–15)
BUN: 27 mg/dL — ABNORMAL HIGH (ref 8–23)
CO2: 33 mmol/L — ABNORMAL HIGH (ref 22–32)
Calcium: 10.4 mg/dL — ABNORMAL HIGH (ref 8.9–10.3)
Chloride: 90 mmol/L — ABNORMAL LOW (ref 98–111)
Creatinine, Ser: 0.88 mg/dL (ref 0.44–1.00)
GFR calc Af Amer: >60 mL/min (ref >60)
GFR calc non Af Amer: >60 mL/min (ref >60)
Glucose, Bld: 185 mg/dL — ABNORMAL HIGH (ref 70–99)
Potassium: 3.7 mmol/L (ref 3.5–5.1)
Sodium: 135 mmol/L (ref 135–145)

## 2019-08-12 LAB — GLUCOSE, CAPILLARY
Glucose-Capillary: 159 mg/dL — ABNORMAL HIGH (ref 70–99)
Glucose-Capillary: 170 mg/dL — ABNORMAL HIGH (ref 70–99)

## 2019-08-12 LAB — MAGNESIUM: Magnesium: 1.7 mg/dL (ref 1.7–2.4)

## 2019-08-12 MED ORDER — DOXYCYCLINE HYCLATE 100 MG PO TABS: 100.0000 mg | ORAL_TABLET | Freq: Two times a day (BID) | ORAL | 0 refills | Status: AC

## 2019-08-12 MED ORDER — PREDNISONE 50 MG PO TABS: 50.0000 mg | ORAL_TABLET | Freq: Every day | ORAL | 0 refills | Status: AC

## 2019-08-12 MED ORDER — DEXTROMETHORPHAN HBR 15 MG/5ML PO SYRP
10.0000 mL | ORAL_SOLUTION | Freq: Four times a day (QID) | ORAL | 0 refills | Status: DC | PRN
Start: 2019-08-12 — End: 2019-12-01

## 2019-08-12 MED ORDER — SALINE SPRAY 0.65 % NA SOLN: 1.0000 | NASAL | 0 refills | Status: DC | PRN

## 2019-08-12 NOTE — Progress Notes (Signed)
Discharge instructions given to patient she reports has called for her ride they will be here at 1200 noon today. PIV removed with cath intact dry dressing applied. Tele removed and CCMD informed.

## 2019-08-12 NOTE — Discharge Summary (Signed)
Physician Discharge Summary  Kathy Irwin HQI:[id number] DOB: 1951/03/29 DOA: 08/01/2019  PCP: Kerin Perna, NP  Admit date: 08/01/2019 Discharge date: 08/12/2019  Admitted From: Home Disposition:  Home   Recommendations for Outpatient Follow-up:  1. Follow up with PCP in 1 week 2. Please obtain CBC in 1 week to ensure resolution of leukocytosis  Discharge Condition: Stable CODE STATUS: Full  Diet recommendation: Heart healthy / carb modified   Brief/Interim Summary: Kathy Irwin is a 69 year old female with history of interstitial lung disease on oxygen at home, insulin-dependent diabetes mellitus type 2, hypertension, chronic thrombocytopenia, chronic diastolic CHF who presents to the emergency department with complaints of progressive exertional dyspnea, bilateral lower extremity swelling.On presentation she was saturating low 80s on room air. Chest x-ray showed vascular congestion, interstitial edema. Patient was admitted for the management of acute on chronic diastolic CHF exacerbation. Started on IV Lasix with intermittent Zaroxolyn.  She diuresed nearly net -5 L during hospitalization with improvement and resolution of peripheral edema.  Due to continued productive cough with increasing leukocytosis, patient was started on prednisone burst and doxycycline for suspected bronchitis.  Her cough improved on treatment.  Discharge Diagnoses:  Principal Problem:   Acute on chronic diastolic CHF (congestive heart failure) (HCC) Active Problems:   Insulin-requiring or dependent type II diabetes mellitus (HCC)   Thrombocytopenia (HCC)   Interstitial lung disease (HCC)   Essential hypertension   Chronic respiratory failure with hypoxia (HCC)   Acute on chronic respiratory failure with hypoxemia (HCC)   Acute on chronic diastolic CHF -Presented with exertional dyspnea, bilateral lower extremity swelling. Chest x-ray on admission showed vascular congestion, interstitial edema.  She is on Lasix, metolazone at home. Echocardiogram on1/2021showed ejection fraction of 60%. -Repeat echocardiogram showed hyperdynamic LV, EF 70 to 75% without wall motion abnormality -Repeat chest x-ray reviewed independently 4/22, no focal consolidation, shows bilateral interstitial edema  -Repeat chest x-ray reviewed independently 4/26, air bronchogram, pulmonary congestion -Strict I/Os, daily weight -Ted hose ordered  -IV lasix --> PO lasix with intermittent zaroxolyn  -Patient reported peripheral edema improved  Fever with productive cough -Respiratory viral panel negative -Cough syrup ordered as needed -Now afebrile, no sign of pneumonia on CXR   -?Bronchitis with increase in productive cough, increase in WBC. 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 Vaughan Browner -Uses 3 L nasal cannula O2 at baseline  Thrombocytopenia -Chronic. Follows with Dr. Irene Limbo.There was suspicion for CML but no evidence of BCR/ABL rearrangement as per previous records. Previous hospitalist discussed with Dr. Irene Limbo on phone and herecommended outpatient follow-up -Resolved, platelet 185 on day of discharge   Hypertension -Continue Toprol  Hyperlipidemia -Continue Pravachol  GERD -Continue PPI  Depression/anxiety -Continue Zoloft  Insulin-dependent diabetes mellitus, poorly controlled -Hemoglobin A1c of 8.4% as per December 2020 -Lantus, sliding scale insulin  Morbid obesity -Estimated body mass index is 44.83 kg/m as calculated from the following:   Height as of this encounter: _0  (1.626 m).   Weight as of this encounter: 118.5 kg.'  Tremors -Could be due to gabapentin, singulair  Dry mouth -Could be due to multiple medications including claritin, tramadol, zoloft, bentyl, robitussin, zanaflex      Discharge  Instructions  Discharge Instructions    (HEART FAILURE PATIENTS) Call MD:  Anytime you have any of the following symptoms: 1) 3 pound weight gain in 24 hours or 5 pounds in 1 week 2) shortness of  breath, with or without a dry hacking cough 3) swelling in the hands, feet or stomach 4) if you have to sleep on extra pillows at night in order to breathe.   Complete by: As directed    Call MD for:  difficulty breathing, headache or visual disturbances   Complete by: As directed    Call MD for:  extreme fatigue   Complete by: As directed    Call MD for:  persistant dizziness or light-headedness   Complete by: As directed    Call MD for:  persistant nausea and vomiting   Complete by: As directed    Call MD for:  severe uncontrolled pain   Complete by: As directed    Call MD for:  temperature >100.4   Complete by: As directed    Diet - low sodium heart healthy   Complete by: As directed    Diet Carb Modified   Complete by: As directed    Discharge instructions   Complete by: As directed    You were cared for by a hospitalist during your hospital stay. If you have any questions about your discharge medications or the care you received while you were in the hospital after you are discharged, you can call the unit and ask to speak with the hospitalist on call if the hospitalist that took care of you is not available. Once you are discharged, your primary care physician will handle any further medical issues. Please note that NO REFILLS for any discharge medications will be authorized once you are discharged, as it is imperative that you return to your primary care physician (or establish a relationship with a primary care physician if you do not have one) for your aftercare needs so that they can reassess your need for medications and monitor your lab values.   Increase activity slowly   Complete by: As directed      Allergies as of 08/12/2019      Reactions   Other Shortness Of Breath, Other (See  Comments)   Newspaper ink =  new chest pain, also   Iodine Other (See Comments)   "Was a long time ago" (Reaction??)   Merbromin Other (See Comments)   Mercurochrome- "Was a long time ago" (Reaction??)      Medication List    STOP taking these medications   meloxicam 7.5 MG tablet Commonly known as: Mobic     TAKE these medications   acetaminophen 500 MG tablet Commonly known as: TYLENOL Take 1,000 mg by mouth 2 (two) times daily as needed (for pain).   albuterol 108 (90 Base) MCG/ACT inhaler Commonly known as: VENTOLIN HFA Inhale 1-2 puffs into the lungs every 6 (six) hours as needed for wheezing or shortness of breath.   Azelastine-Fluticasone 137-50 MCG/ACT Susp Commonly known as: Dymista Place 1-2 sprays into both nostrils daily as needed (for allergies).   blood glucose meter kit and supplies Kit Dispense based on patient and insurance preference. Use up to four times daily as directed. (FOR ICD-9 250.00, 250.01). For QAC - HS accuchecks.   [employer] D3 125 MCG (5000 UT) capsule Generic drug: Cholecalciferol Take 5,000 Units by mouth daily.   dextromethorphan 15 MG/5ML syrup Take 10 mLs (30 mg total) by mouth 4 (four) times daily as needed for cough.   doxycycline 100 MG tablet Commonly known as: VIBRA-TABS Take 1 tablet (100 mg total) by mouth every 12 (twelve) hours for 3 days.   fluticasone 50 MCG/ACT nasal spray Commonly known  as: FLONASE Place 2 sprays into both nostrils daily as needed for allergies or rhinitis.   FREESTYLE LITE test strip Generic drug: glucose blood For glucose testing every before meals at bedtime. Diagnosis E 11.65  Can substitute to any accepted brand   furosemide 40 MG tablet Commonly known as: LASIX Take 40 mg by mouth daily.   gabapentin 300 MG capsule Commonly known as: NEURONTIN Take 1 capsule (300 mg total) by mouth 3 (three) times daily.   glimepiride 2 MG tablet Commonly known as: AMARYL Take 1 tablet (2 mg total) by  mouth daily with breakfast.   Insulin Lispro Prot & Lispro (75-25) 100 UNIT/ML Kwikpen Commonly known as: HumaLOG Mix 75/25 KwikPen Inject 80 Units into the skin See admin instructions. Inject 50 units into the skin before breakfast "and a second dose at bedtime 30 units What changed:   how much to take  additional instructions   levocetirizine 5 MG tablet Commonly known as: XYZAL Take 1 tablet by mouth daily.   metolazone 2.5 MG tablet Commonly known as: ZAROXOLYN Take 2.5 mg by mouth three times a week, take 30 minutes before taking lasix   metoprolol 200 MG 24 hr tablet Commonly known as: TOPROL-XL Take 200 mg by mouth daily.   montelukast 10 MG tablet Commonly known as: SINGULAIR Take 10 mg by mouth at bedtime.   olopatadine 0.1 % ophthalmic solution Commonly known as: Patanol Place 1 drop into both eyes 2 (two) times daily. What changed:   when to take this  reasons to take this   omeprazole 20 MG capsule Commonly known as: PRILOSEC Take 1 capsule (20 mg total) by mouth daily. What changed:   when to take this  reasons to take this   pravastatin 40 MG tablet Commonly known as: PRAVACHOL Take 40 mg by mouth at bedtime.   predniSONE 50 MG tablet Commonly known as: DELTASONE Take 1 tablet (50 mg total) by mouth daily with breakfast for 3 days.   sertraline 100 MG tablet Commonly known as: ZOLOFT Take 100 mg by mouth daily as needed (Anxiety).   sodium chloride 0.65 % Soln nasal spray Commonly known as: OCEAN Place 1 spray into both nostrils as needed for congestion.   tiZANidine 4 MG tablet Commonly known as: Zanaflex Take 1 tablet (4 mg total) by mouth every 6 (six) hours as needed for muscle spasms (back pain).      Follow-up Information    Kerin Perna, NP.   Specialty: Internal Medicine Contact information: [address] [place] [address] [phone]          Allergies  Allergen Reactions  . Other Shortness Of  Breath and Other (See Comments)    Newspaper ink =  new chest pain, also  . Iodine Other (See Comments)    "Was a long time ago" (Reaction??)  . Merbromin Other (See Comments)    Mercurochrome- "Was a long time ago" (Reaction??)    Consultations:  None    Procedures/Studies: DG Chest 2 View  Result Date: 08/11/2019 CLINICAL DATA:  68 year old female with cough and congestion. EXAM: CHEST - 2 VIEW COMPARISON:  Portable chest 08/07/2019 and earlier. FINDINGS: PA and lateral views. Stable lung volumes. Stable mild cardiomegaly. Visualized tracheal air column is within normal limits. Continued diffuse increased pulmonary interstitial opacity, with no pleural fluid evident. No pneumothorax or consolidation. Overall ventilation has not significantly changed. No acute osseous abnormality identified. Negative visible bowel gas pattern. IMPRESSION: 1. Ventilation not significantly changed  from 08/01/2019 with diffuse basilar predominant interstitial markings which are favored due to interstitial lung disease (as suspected by high-resolution CT on 02/11/2019) rather than interstitial edema (no definite pleural fluid). 2. No new cardiopulmonary abnormality. Electronically Signed   By: Genevie Ann M.D.   On: 08/11/2019 08:39   DG Chest 2 View  Result Date: 08/01/2019 CLINICAL DATA:  Hypoxia with shortness of breath EXAM: CHEST - 2 VIEW COMPARISON:  April 02, 2019 FINDINGS: There is cardiomegaly with pulmonary venous hypertension. There is interstitial thickening which may be indicative of a degree of interstitial edema. No appreciable airspace opacity or pleural effusion. No adenopathy. No bone lesions. IMPRESSION: Cardiomegaly with pulmonary vascular congestion. Suspect mild interstitial edema without airspace consolidation. No adenopathy. Electronically Signed   By: Lowella Grip III M.D.   On: 08/01/2019 15:14   DG CHEST PORT 1 VIEW  Result Date: 08/07/2019 CLINICAL DATA:  CHF. EXAM: PORTABLE CHEST  1 VIEW COMPARISON:  08/01/2019. FINDINGS: Mediastinum is stable. Heart size stable. Bilateral interstitial prominence suggesting interstitial edema again noted. Similar findings noted on prior exam. No significant pleural effusion. No pneumothorax. IMPRESSION: Bilateral interstitial prominence suggesting interstitial edema again noted. Pneumonitis cannot be excluded. Similar findings noted on prior exam. Electronically Signed   By: Marcello Moores  Register   On: 08/07/2019 06:07   ECHOCARDIOGRAM COMPLETE  Result Date: 08/06/2019    ECHOCARDIOGRAM REPORT   Patient Name:   Deshea Villagomez Date of Exam: 08/06/2019 Medical Rec #:  [id number]     Height:       64.0 in Accession #:    [id number]    Weight:       264.6 lb Date of Birth:  03-24-1951      BSA:          2.204 m Patient Age:    20 years      BP:           135/77 mmHg Patient Gender: F             HR:           115 bpm. Exam Location:  Inpatient Procedure: 2D Echo, Cardiac Doppler and Color Doppler Indications:    I50.33 Acute on chronic diastolic (congestive) heart failure  History:        Patient has prior history of Echocardiogram examinations, most                 recent 04/22/2019. Risk Factors:Hypertension and Diabetes.                 COVID-19.  Sonographer:    Jonelle Sidle Dance Referring Phys: [id number] AMRIT ADHIKARI  Sonographer Comments: Suboptimal parasternal window. IMPRESSIONS  1. Hyperdynamic LV. LV intracavitary gradient ~10 mmHG. Left ventricular ejection fraction, by estimation, is 70 to 75%. The left ventricle has hyperdynamic function. The left ventricle has no regional wall motion abnormalities. Indeterminate diastolic filling due to E-A fusion.  2. Right ventricular systolic function is normal. The right ventricular size is mildly enlarged. There is moderately elevated pulmonary artery systolic pressure. The estimated right ventricular systolic pressure is 32.4 mmHg.  3. The mitral valve is grossly normal. Trivial mitral valve regurgitation. No evidence of  mitral stenosis.  4. The aortic valve is grossly normal. Aortic valve regurgitation is not visualized. Mild aortic valve sclerosis is present, with no evidence of aortic valve stenosis.  5. The inferior vena cava is normal in size with greater than 50% respiratory variability, suggesting right atrial pressure of 3  mmHg. Comparison(s): Changes from prior study are noted. EF now hyperdynamic. RV mildly enlarged. RVSP moderately elevated. FINDINGS  Left Ventricle: Hyperdynamic LV. LV intracavitary gradient ~10 mmHG. Left ventricular ejection fraction, by estimation, is 70 to 75%. The left ventricle has hyperdynamic function. The left ventricle has no regional wall motion abnormalities. The left ventricular internal cavity size was normal in size. There is no left ventricular hypertrophy. Indeterminate diastolic filling due to E-A fusion. Right Ventricle: The right ventricular size is mildly enlarged. No increase in right ventricular wall thickness. Right ventricular systolic function is normal. There is moderately elevated pulmonary artery systolic pressure. The tricuspid regurgitant velocity is 3.48 m/s, and with an assumed right atrial pressure of 3 mmHg, the estimated right ventricular systolic pressure is 16.0 mmHg. Left Atrium: Left atrial size was normal in size. Right Atrium: Right atrial size was normal in size. Pericardium: There is no evidence of pericardial effusion. Mitral Valve: The mitral valve is grossly normal. Trivial mitral valve regurgitation. No evidence of mitral valve stenosis. Tricuspid Valve: The tricuspid valve is grossly normal. Tricuspid valve regurgitation is trivial. No evidence of tricuspid stenosis. Aortic Valve: The aortic valve is grossly normal.. There is mild thickening and mild calcification of the aortic valve. Aortic valve regurgitation is not visualized. Mild aortic valve sclerosis is present, with no evidence of aortic valve stenosis. There  is mild thickening of the aortic  valve. There is mild calcification of the aortic valve. Pulmonic Valve: The pulmonic valve was grossly normal. Pulmonic valve regurgitation is not visualized. No evidence of pulmonic stenosis. Aorta: The aortic root and ascending aorta are structurally normal, with no evidence of dilitation. Venous: The inferior vena cava is normal in size with greater than 50% respiratory variability, suggesting right atrial pressure of 3 mmHg. IAS/Shunts: The atrial septum is grossly normal.  LEFT VENTRICLE PLAX 2D LVIDd:         4.20 cm  Diastology LVIDs:         3.50 cm  LV e' lateral:   8.81 cm/s LV PW:         1.30 cm  LV E/e' lateral: 9.1 LV IVS:        1.10 cm  LV e' medial:    7.94 cm/s LVOT diam:     2.20 cm  LV E/e' medial:  10.1 LV SV:         83 LV SV Index:   38 LVOT Area:     3.80 cm  RIGHT VENTRICLE             IVC RV Basal diam:  3.10 cm     IVC diam: 1.60 cm RV Mid diam:    2.30 cm RV S prime:     13.70 cm/s TAPSE (M-mode): 2.1 cm LEFT ATRIUM             Index       RIGHT ATRIUM           Index LA diam:        5.00 cm 2.27 cm/m  RA Area:     14.20 cm LA Vol (A2C):   37.8 ml 17.15 ml/m RA Volume:   32.40 ml  14.70 ml/m LA Vol (A4C):   27.4 ml 12.43 ml/m LA Biplane Vol: 32.6 ml 14.79 ml/m  AORTIC VALVE LVOT Vmax:   142.00 cm/s LVOT Vmean:  85.300 cm/s LVOT VTI:    0.218 m  AORTA Ao Root diam: 3.10 cm Ao Asc diam:  3.60  cm MITRAL VALVE                TRICUSPID VALVE MV Area (PHT): 3.60 cm     TR Peak grad:   48.4 mmHg MV Decel Time: 211 msec     TR Vmax:        348.00 cm/s MV E velocity: 80.50 cm/s MV A velocity: 109.00 cm/s  SHUNTS MV E/A ratio:  0.74         Systemic VTI:  0.22 m                             Systemic Diam: 2.20 cm Eleonore Chiquito MD Electronically signed by Eleonore Chiquito MD Signature Date/Time: 08/06/2019/11:46:00 AM    Final    VAS [place] LOWER EXTREMITY VENOUS (DVT)  Result Date: 08/03/2019  Lower Venous DVTStudy Indications: SOB, and Edema.  Risk Factors: CHF. Limitations: Body habitus.  Comparison Study: No prior study on file for comparison Performing Technologist: Sharion Dove RVS  Examination Guidelines: A complete evaluation includes B-mode imaging, spectral Doppler, color Doppler, and power Doppler as needed of all accessible portions of each vessel. Bilateral testing is considered an integral part of a complete examination. Limited examinations for reoccurring indications may be performed as noted. The reflux portion of the exam is performed with the patient in reverse Trendelenburg.  +---------+---------------+---------+-----------+----------+--------------+ RIGHT    CompressibilityPhasicitySpontaneityPropertiesThrombus Aging +---------+---------------+---------+-----------+----------+--------------+ CFV      Full           Yes      Yes                                 +---------+---------------+---------+-----------+----------+--------------+ SFJ      Full                                                        +---------+---------------+---------+-----------+----------+--------------+ FV Prox  Full                                                        +---------+---------------+---------+-----------+----------+--------------+ FV Mid   Full                                                        +---------+---------------+---------+-----------+----------+--------------+ FV DistalFull                                                        +---------+---------------+---------+-----------+----------+--------------+ PFV      Full                                                        +---------+---------------+---------+-----------+----------+--------------+  POP      Full           Yes      Yes                                 +---------+---------------+---------+-----------+----------+--------------+ PTV      Full                                                         +---------+---------------+---------+-----------+----------+--------------+ PERO     Full                                                        +---------+---------------+---------+-----------+----------+--------------+   +---------+---------------+---------+-----------+----------+--------------+ LEFT     CompressibilityPhasicitySpontaneityPropertiesThrombus Aging +---------+---------------+---------+-----------+----------+--------------+ CFV      Full           Yes      Yes                                 +---------+---------------+---------+-----------+----------+--------------+ SFJ      Full                                                        +---------+---------------+---------+-----------+----------+--------------+ FV Prox  Full                                                        +---------+---------------+---------+-----------+----------+--------------+ FV Mid   Full                                                        +---------+---------------+---------+-----------+----------+--------------+ FV DistalFull                                                        +---------+---------------+---------+-----------+----------+--------------+ PFV      Full                                                        +---------+---------------+---------+-----------+----------+--------------+ POP      Full           Yes      Yes                                 +---------+---------------+---------+-----------+----------+--------------+  PTV      Full                                                        +---------+---------------+---------+-----------+----------+--------------+ PERO     Full                                                        +---------+---------------+---------+-----------+----------+--------------+     Summary: BILATERAL: - No evidence of deep vein thrombosis seen in the lower extremities, bilaterally.   *See table(s)  above for measurements and observations. Electronically signed by Monica Martinez MD on 08/03/2019 at 3:07:53 PM.    Final        Discharge Exam: Vitals:   08/11/19 2041 08/12/19 0506  BP: (!) 148/72 128/77  Pulse: 76 75  Resp: 19 19  Temp: 98.2 F (36.8 C) 97.8 F (36.6 C)  SpO2: 91% 96%    General: Pt is alert, awake, not in acute distress Cardiovascular: RRR, S1/S2 +, no edema Respiratory: CTA bilaterally, no wheezing, no rhonchi, no respiratory distress, no conversational dyspnea Abdominal: Soft, NT, ND, bowel sounds + Extremities: no edema, no cyanosis Psych: Normal mood and affect, stable judgement and insight     The results of significant diagnostics from this hospitalization (including imaging, microbiology, ancillary and laboratory) are listed below for reference.     Microbiology: Recent Results (from the past 240 hour(s))  Respiratory Panel by PCR     Status: None   Collection Time: 08/06/19  2:32 PM   Specimen: Nasopharyngeal Swab; Respiratory  Result Value Ref Range Status   Adenovirus NOT DETECTED NOT DETECTED Final   Coronavirus 229E NOT DETECTED NOT DETECTED Final    Comment: (NOTE) The Coronavirus on the Respiratory Panel, DOES NOT test for the novel  Coronavirus (2019 nCoV)    Coronavirus HKU1 NOT DETECTED NOT DETECTED Final   Coronavirus NL63 NOT DETECTED NOT DETECTED Final   Coronavirus OC43 NOT DETECTED NOT DETECTED Final   Metapneumovirus NOT DETECTED NOT DETECTED Final   Rhinovirus / Enterovirus NOT DETECTED NOT DETECTED Final   Influenza A NOT DETECTED NOT DETECTED Final   Influenza B NOT DETECTED NOT DETECTED Final   Parainfluenza Virus 1 NOT DETECTED NOT DETECTED Final   Parainfluenza Virus 2 NOT DETECTED NOT DETECTED Final   Parainfluenza Virus 3 NOT DETECTED NOT DETECTED Final   Parainfluenza Virus 4 NOT DETECTED NOT DETECTED Final   Respiratory Syncytial Virus NOT DETECTED NOT DETECTED Final   Bordetella pertussis NOT DETECTED NOT  DETECTED Final   Chlamydophila pneumoniae NOT DETECTED NOT DETECTED Final   Mycoplasma pneumoniae NOT DETECTED NOT DETECTED Final    Comment: Performed at [facility], [place]. [address], [place], [place] [address]     Labs: BNP (last 3 results) Recent Labs    11/15/18 2108 12/23/18 1446 08/01/19 1429  BNP 74.4 21.0 73.2   Basic Metabolic Panel: Recent Labs  Lab 08/07/19 0627 08/07/19 0627 08/08/19 0658 08/09/19 0533 08/10/19 0707 08/11/19 0512 08/12/19 0432  NA 137   < > 139 137 136 136 135  K 4.0   < > 3.7 3.3* 3.4* 2.8* 3.7  CL 95*   < > 95* 92* 89* 87* 90*  CO2 33*   < > 34* 33* 32 36* 33*  GLUCOSE 164*   < > 123* 133* 214* 150* 185*  BUN 13   < > _0 27*  CREATININE 0.75   < > 0.82 0.71 0.86 0.76 0.88  CALCIUM 9.6   < > 10.1 10.1 10.3 10.5* 10.4*  MG 1.8  --   --  1.6* 1.7 1.7 1.7   < > = values in this interval not displayed.   Liver Function Tests: No results for input(s): AST, ALT, ALKPHOS, BILITOT, PROT, ALBUMIN in the last 168 hours. No results for input(s): LIPASE, AMYLASE in the last 168 hours. No results for input(s): AMMONIA in the last 168 hours. CBC: Recent Labs  Lab 08/07/19 0627 08/09/19 0814 08/10/19 0707 08/11/19 0512 08/12/19 0432  WBC 7.7 12.7* 12.9* 14.7* 16.9*  HGB 13.6 13.9 13.4 13.5 13.5  HCT 42.8 42.8 41.6 42.3 41.7  MCV 100.0 98.4 98.1 96.6 96.1  PLT 48* 125* 138* 164 185   Cardiac Enzymes: No results for input(s): CKTOTAL, CKMB, CKMBINDEX, TROPONINI in the last 168 hours. BNP: Invalid input(s): POCBNP CBG: Recent Labs  Lab 08/11/19 0647 08/11/19 1110 08/11/19 1647 08/11/19 2234 08/12/19 0602  GLUCAP 154* 193* 408* 309* 159*   D-Dimer No results for input(s): DDIMER in the last 72 hours. Hgb A1c No results for input(s): HGBA1C in the last 72 hours. Lipid Profile No results for input(s): CHOL, HDL, LDLCALC, TRIG, CHOLHDL, LDLDIRECT in the last 72 hours. Thyroid function studies No results for input(s):  TSH, T4TOTAL, T3FREE, THYROIDAB in the last 72 hours.  Invalid input(s): FREET3 Anemia work up No results for input(s): VITAMINB12, FOLATE, FERRITIN, TIBC, IRON, RETICCTPCT in the last 72 hours. Urinalysis No results found for: COLORURINE, APPEARANCEUR, LABSPEC, PHURINE, GLUCOSEU, HGBUR, BILIRUBINUR, KETONESUR, PROTEINUR, UROBILINOGEN, NITRITE, LEUKOCYTESUR Sepsis Labs Invalid input(s): PROCALCITONIN,  WBC,  LACTICIDVEN Microbiology Recent Results (from the past 240 hour(s))  Respiratory Panel by PCR     Status: None   Collection Time: 08/06/19  2:32 PM   Specimen: Nasopharyngeal Swab; Respiratory  Result Value Ref Range Status   Adenovirus NOT DETECTED NOT DETECTED Final   Coronavirus 229E NOT DETECTED NOT DETECTED Final    Comment: (NOTE) The Coronavirus on the Respiratory Panel, DOES NOT test for the novel  Coronavirus (2019 nCoV)    Coronavirus HKU1 NOT DETECTED NOT DETECTED Final   Coronavirus NL63 NOT DETECTED NOT DETECTED Final   Coronavirus OC43 NOT DETECTED NOT DETECTED Final   Metapneumovirus NOT DETECTED NOT DETECTED Final   Rhinovirus / Enterovirus NOT DETECTED NOT DETECTED Final   Influenza A NOT DETECTED NOT DETECTED Final   Influenza B NOT DETECTED NOT DETECTED Final   Parainfluenza Virus 1 NOT DETECTED NOT DETECTED Final   Parainfluenza Virus 2 NOT DETECTED NOT DETECTED Final   Parainfluenza Virus 3 NOT DETECTED NOT DETECTED Final   Parainfluenza Virus 4 NOT DETECTED NOT DETECTED Final   Respiratory Syncytial Virus NOT DETECTED NOT DETECTED Final   Bordetella pertussis NOT DETECTED NOT DETECTED Final   Chlamydophila pneumoniae NOT DETECTED NOT DETECTED Final   Mycoplasma pneumoniae NOT DETECTED NOT DETECTED Final    Comment: Performed at [facility], [address], [place], [place] [address]     Patient was seen and examined on the day of discharge and was found to be in stable condition. Time coordinating discharge: 25 minutes including assessment  and  coordination of care, as well as examination of the patient.   SIGNED:  Dessa Phi, DO [facility] 08/12/2019, 9:56 AM

## 2019-08-12 NOTE — TOC Transition Note (Signed)
Transition of Care [facility]) - CM/SW Discharge Note   Patient Details  Name: Kathy Irwin MRN: [id number] Date of Birth: 21-Jan-1951  [facility]) CM/SW Contact:  Zenon Mayo, RN Phone Number: 08/12/2019, 12:29 PM   Clinical Narrative:    NCM spoke with patient , she states she is not homebound she plans to go back to work. She also states she does not need any DME right now .  If she changes her mind she will let this NCM know.   Final next level of care: Home/Self Care Barriers to Discharge: No Barriers Identified   Patient Goals and CMS Choice Patient states their goals for this hospitalization and ongoing recovery are:: get better      Discharge Placement                       Discharge Plan and Services   Discharge Planning Services: CM Consult              DME Agency: NA       HH Arranged: NA          Social Determinants of Health (SDOH) Interventions     Readmission Risk Interventions Readmission Risk Prevention Plan 08/12/2019  Transportation Screening Complete  PCP or Specialist Appt within 3-5 Days Complete  HRI or [place] Complete  Social Work Consult for [place] Planning/Counseling Complete  Palliative Care Screening Not Applicable  Medication Review Press photographer) Complete

## 2019-08-13 ENCOUNTER — Telehealth: Payer: Self-pay

## 2019-08-13 NOTE — Telephone Encounter (Signed)
Transition Care Management Follow-up Telephone Call  Date of discharge and from where: 08/12/2019, [facility]   How have you been since you were released from the hospital?she said she is feeling okay and is doing better being out the hospital  Any questions or concerns?  none at this time.  She said she plans to return to work 08/18/2019  Items Reviewed:  Did the pt receive and understand the discharge instructions provided?  yes  Medications obtained and verified?  she said that she has all of her medications including her new meds.  She confirmed her insulin orders.  She did not have  Any questions about the meds.   Any new allergies since your discharge?   None reported   Do you have support at home?  lives alone  Other (ie: DME, Home Health, etc) has O2 , stated that she uses it mostly at night  @ 3L.  She also has a portable tank.  Has glucometer but would like to speak to provider about ordering supplies that can be delivered to her home  Has scale.  Needs to start daily weights and logging her weights.   No home health or DME ordered.   Functional Questionnaire: (I = Independent and D = Dependent) ADL's: independent   Follow up appointments reviewed:    PCP Hospital f/u appt confirmed?Juluis Mire, NP 09/02/2019@ 1430.  She did not want to schedule an appt any sooner  Specialist Hospital f/u appt confirmed? pulmonary - 08/20/2019  Are transportation arrangements needed?   no  If their condition worsens, is the pt aware to call  their PCP or go to the ED?  yes  Was the patient provided with contact information for the PCP's office or ED?  yes  Was the pt encouraged to call back with questions or concerns?  yes

## 2019-08-14 ENCOUNTER — Encounter (HOSPITAL_COMMUNITY): Payer: Self-pay

## 2019-08-14 ENCOUNTER — Ambulatory Visit (HOSPITAL_COMMUNITY)
Admission: EM | Admit: 2019-08-14 | Discharge: 2019-08-14 | Disposition: A | Discharge status: Home / Self Care | Payer: Medicare Other | Source: Home / Self Care | Attending: Family Medicine | Admitting: Family Medicine

## 2019-08-14 ENCOUNTER — Other Ambulatory Visit: Payer: Self-pay

## 2019-08-14 ENCOUNTER — Emergency Department (HOSPITAL_COMMUNITY)
Admission: EM | Admit: 2019-08-14 | Discharge: 2019-08-15 | Disposition: A | Discharge status: Home / Self Care | Payer: Medicare Other | Attending: Emergency Medicine | Admitting: Emergency Medicine

## 2019-08-14 ENCOUNTER — Emergency Department (HOSPITAL_COMMUNITY): Payer: Medicare Other

## 2019-08-14 ENCOUNTER — Ambulatory Visit (HOSPITAL_COMMUNITY): Payer: Medicare Other

## 2019-08-14 DIAGNOSIS — Z79899 Other long term (current) drug therapy: Secondary | ICD-10-CM | POA: Insufficient documentation

## 2019-08-14 DIAGNOSIS — R1012 Left upper quadrant pain: Secondary | ICD-10-CM

## 2019-08-14 DIAGNOSIS — E119 Type 2 diabetes mellitus without complications: Secondary | ICD-10-CM | POA: Insufficient documentation

## 2019-08-14 DIAGNOSIS — I110 Hypertensive heart disease with heart failure: Secondary | ICD-10-CM | POA: Diagnosis not present

## 2019-08-14 DIAGNOSIS — I5032 Chronic diastolic (congestive) heart failure: Secondary | ICD-10-CM | POA: Insufficient documentation

## 2019-08-14 DIAGNOSIS — Z794 Long term (current) use of insulin: Secondary | ICD-10-CM | POA: Diagnosis not present

## 2019-08-14 DIAGNOSIS — M546 Pain in thoracic spine: Secondary | ICD-10-CM

## 2019-08-14 DIAGNOSIS — R112 Nausea with vomiting, unspecified: Secondary | ICD-10-CM

## 2019-08-14 DIAGNOSIS — R109 Unspecified abdominal pain: Secondary | ICD-10-CM | POA: Diagnosis present

## 2019-08-14 LAB — POCT URINALYSIS DIP (DEVICE)
Bilirubin Urine: NEGATIVE
Glucose, UA: NEGATIVE mg/dL
Hgb urine dipstick: NEGATIVE
Ketones, ur: NEGATIVE mg/dL
Leukocytes,Ua: NEGATIVE
Nitrite: NEGATIVE
Protein, ur: 30 mg/dL — AB
Specific Gravity, Urine: 1.02 (ref 1.005–1.030)
Urobilinogen, UA: 1 mg/dL (ref 0.0–1.0)
pH: 7.5 (ref 5.0–8.0)

## 2019-08-14 LAB — CBG MONITORING, ED: Glucose-Capillary: 180 mg/dL — ABNORMAL HIGH (ref 70–99)

## 2019-08-14 LAB — CBC
HCT: 44 % (ref 36.0–46.0)
Hemoglobin: 14.1 g/dL (ref 12.0–15.0)
MCH: 31.5 pg (ref 26.0–34.0)
MCHC: 32 g/dL (ref 30.0–36.0)
MCV: 98.2 fL (ref 80.0–100.0)
Platelets: 227 10*3/uL (ref 150–400)
RBC: 4.48 MIL/uL (ref 3.87–5.11)
RDW: 18.3 % — ABNORMAL HIGH (ref 11.5–15.5)
WBC: 9.3 10*3/uL (ref 4.0–10.5)
nRBC: 0.4 % — ABNORMAL HIGH (ref 0.0–0.2)

## 2019-08-14 LAB — COMPREHENSIVE METABOLIC PANEL
ALT: 19 U/L (ref 0–44)
AST: 23 U/L (ref 15–41)
Albumin: 4.6 g/dL (ref 3.5–5.0)
Alkaline Phosphatase: 59 U/L (ref 38–126)
Anion gap: 13 (ref 5–15)
BUN: 23 mg/dL (ref 8–23)
CO2: 31 mmol/L (ref 22–32)
Calcium: 10.7 mg/dL — ABNORMAL HIGH (ref 8.9–10.3)
Chloride: 98 mmol/L (ref 98–111)
Creatinine, Ser: 0.75 mg/dL (ref 0.44–1.00)
GFR calc Af Amer: >60 mL/min (ref >60)
GFR calc non Af Amer: >60 mL/min (ref >60)
Glucose, Bld: 214 mg/dL — ABNORMAL HIGH (ref 70–99)
Potassium: 3.4 mmol/L — ABNORMAL LOW (ref 3.5–5.1)
Sodium: 142 mmol/L (ref 135–145)
Total Bilirubin: 0.9 mg/dL (ref 0.3–1.2)
Total Protein: 8.1 g/dL (ref 6.5–8.1)

## 2019-08-14 LAB — URINALYSIS, ROUTINE W REFLEX MICROSCOPIC
Bilirubin Urine: NEGATIVE
Glucose, UA: NEGATIVE mg/dL
Hgb urine dipstick: NEGATIVE
Ketones, ur: NEGATIVE mg/dL
Leukocytes,Ua: NEGATIVE
Nitrite: NEGATIVE
Protein, ur: NEGATIVE mg/dL
Specific Gravity, Urine: 1.02 (ref 1.005–1.030)
pH: 7 (ref 5.0–8.0)

## 2019-08-14 LAB — LIPASE, BLOOD: Lipase: 24 U/L (ref 11–51)

## 2019-08-14 MED ORDER — FENTANYL CITRATE (PF) 100 MCG/2ML IJ SOLN
50.0000 ug | Freq: Once | INTRAMUSCULAR | Status: AC
  Administered 2019-08-15: 50 ug via INTRAVENOUS
  Filled 2019-08-14: qty 2

## 2019-08-14 MED ORDER — ONDANSETRON HCL 4 MG/2ML IJ SOLN
4.0000 mg | Freq: Once | INTRAMUSCULAR | Status: AC
  Administered 2019-08-15: 4 mg via INTRAVENOUS

## 2019-08-14 MED ORDER — ONDANSETRON HCL 4 MG/2ML IJ SOLN
4.0000 mg | Freq: Once | INTRAMUSCULAR | Status: DC
  Filled 2019-08-14: qty 2

## 2019-08-14 MED ORDER — SODIUM CHLORIDE 0.9 % IV BOLUS
1000.0000 mL | Freq: Once | INTRAVENOUS | Status: AC
  Administered 2019-08-15: 1000 mL via INTRAVENOUS

## 2019-08-14 MED ORDER — ONDANSETRON 4 MG PO TBDP
ORAL_TABLET | ORAL | Status: AC
  Filled 2019-08-14: qty 1

## 2019-08-14 MED ORDER — ONDANSETRON 4 MG PO TBDP
4.0000 mg | ORAL_TABLET | Freq: Once | ORAL | Status: AC
  Administered 2019-08-14: 4 mg via ORAL

## 2019-08-14 NOTE — ED Triage Notes (Signed)
Pt sent here by MD from urgent care for further testing to see why she is having LUQ abdominal pain.

## 2019-08-14 NOTE — ED Triage Notes (Signed)
Pt reports she woke up with back pain and radiates to left upper abdominal pain, nausea and vomitting.

## 2019-08-14 NOTE — ED Provider Notes (Signed)
[facility]   [id number] 08/14/19 Arrival Time: 6295  ASSESSMENT & PLAN:  1. Left upper quadrant abdominal pain   2. Acute left-sided thoracic back pain   3. Intractable vomiting with nausea, unspecified vomiting type     Unable to perform CXR secondary to persistent vomiting.  Labs Reviewed  CBG MONITORING, ED - Abnormal; Notable for the following components:      Result Value   Glucose-Capillary 180 (*)    All other components within normal limits  POCT URINALYSIS DIP (DEVICE) - Abnormal; Notable for the following components:   Protein, ur 30 (*)    All other components within normal limits   Given level of pain she describes and persistent emesis, recommend ED evaluation. She agrees. Prefers private vehicle. Stable upon discharge.   Reviewed expectations re: course of current medical issues. Questions answered. Outlined signs and symptoms indicating need for more acute intervention. Patient verbalized understanding. After Visit Summary given.   SUBJECTIVE: History from: patient. Kathy Irwin is a 69 y.o. female who reports fairly abrupt onset of L-sided back pain after waking this morning; persistent; radiates anteriorly. Associated nausea with non-bilious, non-bloody emesis. No diarrhea. Afebrile. No sick contacts. Decreased appetite and PO intake today. Pain described as aching and dull. Feeling well yesterday. Slept through the night without issue. No specific aggravating or alleviating factors reported. No OTC tx. No associated chest pain or SOB reported.  No LMP recorded. Patient has had a hysterectomy.   Past Surgical History:  Procedure Laterality Date  . ABDOMINAL HYSTERECTOMY    . VIDEO BRONCHOSCOPY Bilateral 04/02/2019   Procedure: VIDEO BRONCHOSCOPY WITH FLUORO;  Surgeon: Marshell Garfinkel, MD;  Location: [facility];  Service: Cardiopulmonary;  Laterality: Bilateral;     OBJECTIVE:  Vitals:   08/14/19 1741  BP: (!) 151/61  Pulse: 75  Resp:  (!) 28  Temp: 98.1 F (36.7 C)  TempSrc: Oral  SpO2: 95%    Recheck RR: 18.  General appearance: alert, oriented, no acute distress but does appear uncomfortable; vomites several times while here HEENT: Person; AT Lungs: unlabored respirations; speaks full sentences without difficulty; does breath more quickly when pain hits Abdomen: morbidly obese (limits exam) soft; without distention; mild  and poorly localized tenderness to palpation over LUQ at L ribs; without guarding or rebound tenderness Back: without reported CVA tenderness; FROM at waist Extremities: symmetrical; without gross deformities Skin: warm and dry Neurologic: normal gait Psychological: alert and cooperative; normal mood and affect   Allergies  Allergen Reactions  . Other Shortness Of Breath and Other (See Comments)    Newspaper ink =  new chest pain, also  . Iodine Other (See Comments)    "Was a long time ago" (Reaction??)  . Merbromin Other (See Comments)    Mercurochrome- "Was a long time ago" (Reaction??)                                               Past Medical History:  Diagnosis Date  . Acute respiratory failure with hypoxia ([place]) 12/2018  . Diabetes mellitus without complication ([place])   . 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 Hypertension   . ILD (interstitial lung disease) ([place]) 12/24/2018    Social History   Socioeconomic History  . Marital status: Married    Spouse name: Not on file  . Number of children: Not on file  .  Years of education: Not on file  . Highest education level: Not on file  Occupational History  . Not on file  Tobacco Use  . Smoking status: Never Smoker  . Smokeless tobacco: Never Used  Substance and Sexual Activity  . Alcohol use: Never  . Drug use: Never  . Sexual activity: Not on file  Other Topics Concern  . Not on file  Social History Narrative  . Not on file   Social Determinants of Health   Financial Resource Strain:   . Difficulty of Paying  Living Expenses:   Food Insecurity:   . Worried About Charity fundraiser in the Last Year:   . Arboriculturist in the Last Year:   Transportation Needs:   . Film/video editor (Medical):   Marland Kitchen Lack of Transportation (Non-Medical):   Physical Activity:   . Days of Exercise per Week:   . Minutes of Exercise per Session:   Stress:   . Feeling of Stress :   Social Connections:   . Frequency of Communication with Friends and Family:   . Frequency of Social Gatherings with Friends and Family:   . Attends Religious Services:   . Active Member of Clubs or Organizations:   . Attends Archivist Meetings:   Marland Kitchen Marital Status:   Intimate Partner Violence:   . Fear of Current or Ex-Partner:   . Emotionally Abused:   Marland Kitchen Physically Abused:   . Sexually Abused:     Family History  Problem Relation Age of Onset  . Diabetes Other   . Hypertension Other      Vanessa Kick, MD 08/14/19 1902

## 2019-08-14 NOTE — ED Notes (Signed)
Attempted IV x 2. Unsuccessful.

## 2019-08-14 NOTE — ED Provider Notes (Signed)
[facility] DEPT Provider Note   CSN: [id number] Arrival date & time: 08/14/19  1908     History Chief Complaint  Patient presents with  . Abdominal Pain    Kathy Irwin is a 69 y.o. female.  69yo F w/ extensive PMH including ILD on 3L home O2, CHF, IDDM, chronic myeloid leukemia, morbid obesity who p/w back and abdominal pain.  Around 10 AM this morning, patient had a sudden onset of severe left flank pain that wraps around to her left upper quadrant. Pain has been waxing and waning but persistent all day, associated w/ N/V. No diarrhea. No fevers or urinary symptoms. No sick contacts. Was seen at [facility] and sent to ED for evaluation.  Of note, pt was hospitalized this month for CHF exacerbation, discharged home on 4/27. She is compliant w/ home medications.   The history is provided by the patient.  Abdominal Pain      Past Medical History:  Diagnosis Date  . Acute respiratory failure with hypoxia ([place]) 12/2018  . Diabetes mellitus without complication ([place])   . 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 Hypertension   . ILD (interstitial lung disease) ([place]) 12/24/2018    Patient Active Problem List   Diagnosis Date Noted  . Acute on chronic diastolic CHF (congestive heart failure) ([place]) 08/02/2019  . Chronic respiratory failure with hypoxia ([place]) 08/02/2019  . Acute on chronic respiratory failure with hypoxemia ([place]) 08/02/2019  . Atherosclerosis of aorta ([place]) 01/23/2019  . Interstitial lung disease ([place]) 12/24/2018  . Obesity, Class III, BMI 40-49.9 (morbid obesity) ([place]) 12/24/2018  . Pneumonia due to COVID-19 virus 11/15/2018  . 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 Thrombocytopenia ([place]) 12/19/2017  . Insulin-requiring or dependent type II diabetes mellitus ([place]) 12/17/2017  . HTN (hypertension) 12/17/2017  . Essential hypertension 11/05/2017  . 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[place]) 11/05/2017    Past Surgical History:    Procedure Laterality Date  . ABDOMINAL HYSTERECTOMY    . VIDEO BRONCHOSCOPY Bilateral 04/02/2019   Procedure: VIDEO BRONCHOSCOPY WITH FLUORO;  Surgeon: Marshell Garfinkel, MD;  Location: [facility];  Service: Cardiopulmonary;  Laterality: Bilateral;     OB History   No obstetric history on file.     Family History  Problem Relation Age of Onset  . Diabetes Other   . Hypertension Other     Social History   Tobacco Use  . Smoking status: Never Smoker  . Smokeless tobacco: Never Used  Substance Use Topics  . Alcohol use: Never  . Drug use: Never    Home Medications Prior to Admission medications   Medication Sig Start Date End Date Taking? Authorizing Provider  acetaminophen (TYLENOL) 500 MG tablet Take 1,000 mg by mouth 2 (two) times daily as needed (for pain).   Yes [provider]  albuterol (VENTOLIN HFA) 108 (90 Base) MCG/ACT inhaler Inhale 1-2 puffs into the lungs every 6 (six) hours as needed for wheezing or shortness of breath. 07/24/19  Yes Wieters, Hallie C, PA-C  Azelastine-Fluticasone (DYMISTA) 137-50 MCG/ACT SUSP Place 1-2 sprays into both nostrils daily as needed (for allergies). 05/15/19  Yes Kerin Perna, NP  [employer] D3 125 MCG (5000 UT) capsule Take 5,000 Units by mouth daily. 07/16/19  Yes [provider]  dextromethorphan 15 MG/5ML syrup Take 10 mLs (30 mg total) by mouth 4 (four) times daily as needed for cough. 08/12/19  Yes Dessa Phi, DO  doxycycline (VIBRA-TABS) 100 MG tablet Take 1 tablet (100 mg total) by mouth  every 12 (twelve) hours for 3 days. 08/12/19 08/15/19 Yes Dessa Phi, DO  fluticasone (FLONASE) 50 MCG/ACT nasal spray Place 2 sprays into both nostrils daily as needed for allergies or rhinitis.  07/15/19  Yes [provider]  furosemide (LASIX) 40 MG tablet Take 40 mg by mouth daily as needed for fluid.    Yes [provider]  gabapentin (NEURONTIN) 300 MG capsule Take 1 capsule (300 mg total) by mouth 3  (three) times daily. Patient taking differently: Take 300 mg by mouth 3 (three) times daily as needed (pain).  05/15/19  Yes Kerin Perna, NP  glimepiride (AMARYL) 2 MG tablet Take 1 tablet (2 mg total) by mouth daily with breakfast. 05/15/19  Yes Kerin Perna, NP  Insulin Lispro Prot & Lispro (HUMALOG MIX 75/25 KWIKPEN) (75-25) 100 UNIT/ML Kwikpen Inject 80 Units into the skin See admin instructions. Inject 50 units into the skin before breakfast "and a second dose at bedtime 30 units Patient taking differently: Inject 30-50 Units into the skin See admin instructions. Inject 50 units into the skin before breakfast and a second dose at bedtime 30 units 05/15/19  Yes Kerin Perna, NP  levocetirizine (XYZAL) 5 MG tablet Take 1 tablet by mouth daily as needed for allergies.  07/15/19  Yes [provider]  metolazone (ZAROXOLYN) 2.5 MG tablet Take 2.5 mg by mouth three times a week, take 30 minutes before taking lasix 05/06/19  Yes Troy Sine, MD  metoprolol (TOPROL-XL) 200 MG 24 hr tablet Take 200 mg by mouth daily.   Yes [provider]  montelukast (SINGULAIR) 10 MG tablet Take 10 mg by mouth at bedtime.  05/31/19  Yes [provider]  olopatadine (PATANOL) 0.1 % ophthalmic solution Place 1 drop into both eyes 2 (two) times daily. Patient taking differently: Place 1 drop into both eyes 2 (two) times daily as needed for allergies.  07/24/19  Yes Wieters, Hallie C, PA-C  omeprazole (PRILOSEC) 20 MG capsule Take 1 capsule (20 mg total) by mouth daily. Patient taking differently: Take 20 mg by mouth daily as needed (GERD).  12/26/18  Yes Hongalgi, Lenis Dickinson, MD  pravastatin (PRAVACHOL) 40 MG tablet Take 40 mg by mouth at bedtime.   Yes [provider]  predniSONE (DELTASONE) 50 MG tablet Take 1 tablet (50 mg total) by mouth daily with breakfast for 3 days. 08/12/19 08/15/19 Yes Dessa Phi, DO  sertraline (ZOLOFT) 100 MG tablet Take 100 mg by mouth daily  as needed (Anxiety).   Yes [provider]  sodium chloride (OCEAN) 0.65 % SOLN nasal spray Place 1 spray into both nostrils as needed for congestion. 08/12/19  Yes Dessa Phi, DO  tiZANidine (ZANAFLEX) 4 MG tablet Take 1 tablet (4 mg total) by mouth every 6 (six) hours as needed for muscle spasms (back pain). 07/24/19  Yes Wieters, Hallie C, PA-C  blood glucose meter kit and supplies KIT Dispense based on patient and insurance preference. Use up to four times daily as directed. (FOR ICD-9 250.00, 250.01). For QAC - HS accuchecks. 11/22/18   Thurnell Lose, MD  glucose blood (FREESTYLE LITE) test strip For glucose testing every before meals at bedtime. Diagnosis E 11.65  Can substitute to any accepted brand 11/22/18   Thurnell Lose, MD    Allergies    Other, Iodine, and Merbromin  Review of Systems   Review of Systems  Gastrointestinal: Positive for abdominal pain.   All other systems reviewed and are  negative except that which was mentioned in HPI  Physical Exam Updated Vital Signs BP (!) 172/83 (BP Location: Left Arm)   Pulse 92   Temp 98.3 F (36.8 C) (Oral)   Resp 17   Ht _0  (1.626 m)   Wt 117 kg   SpO2 91%   BMI 44.29 kg/m   Physical Exam Vitals and nursing note reviewed.  Constitutional:      General: She is not in acute distress.    Appearance: She is well-developed.     Comments: Uncomfortable, holding emesis bag  HENT:     Head: Normocephalic and atraumatic.  Eyes:     Conjunctiva/sclera: Conjunctivae normal.  Cardiovascular:     Rate and Rhythm: Normal rate and regular rhythm.     Heart sounds: Normal heart sounds. No murmur.  Pulmonary:     Effort: Pulmonary effort is normal.     Breath sounds: Normal breath sounds.  Abdominal:     General: Bowel sounds are normal. There is no distension.     Palpations: Abdomen is soft.     Tenderness: There is abdominal tenderness in the left upper quadrant. There is no guarding or rebound.    Musculoskeletal:     Cervical back: Neck supple.     Comments: Mild BLE edema  Skin:    General: Skin is warm and dry.  Neurological:     Mental Status: She is alert and oriented to person, place, and time.     Comments: Fluent speech  Psychiatric:        Judgment: Judgment normal.     ED Results / Procedures / Treatments   Labs (all labs ordered are listed, but only abnormal results are displayed) Labs Reviewed  COMPREHENSIVE METABOLIC PANEL - Abnormal; Notable for the following components:      Result Value   Potassium 3.4 (*)    Glucose, Bld 214 (*)    Calcium 10.7 (*)    All other components within normal limits  CBC - Abnormal; Notable for the following components:   RDW 18.3 (*)    nRBC 0.4 (*)    All other components within normal limits  LIPASE, BLOOD  URINALYSIS, ROUTINE W REFLEX MICROSCOPIC  TROPONIN I (HIGH SENSITIVITY)    EKG None  Radiology No results found.  Procedures Procedures (including critical care time)  Medications Ordered in ED Medications  ondansetron (ZOFRAN) injection 4 mg (4 mg Intravenous Given 08/15/19 0047)  fentaNYL (SUBLIMAZE) injection 50 mcg (50 mcg Intravenous Given 08/15/19 0047)  sodium chloride 0.9 % bolus 1,000 mL (1,000 mLs Intravenous New Bag/Given 08/15/19 0048)    ED Course  I have reviewed the triage vital signs and the nursing notes.  Pertinent labs & imaging results that were available during my care of the patient were reviewed by me and considered in my medical decision making (see chart for details).    MDM Rules/Calculators/A&P                      Uncomfortable, vomiting on exam. VS notable for hypertension. LUQ tenderness noted. She reports pain started in flank. DDX includes kidney stone, pyelonephritis, gastroparesis, volvulus, SBO. Labs including CMP, lipase, CBC, and UA unremarkable. Obtained CT renal study which is pending. I have added on EKG and trop to screen for ACS although sx are not highly  suggestive of cardiac disease and pt not having CP. I am signing out to oncoming provider pending w/u results and reassessment.  Final Clinical Impression(s) / ED Diagnoses Final diagnoses:  None    Rx / DC Orders ED Discharge Orders    None       Trae Bovenzi, Wenda Overland, MD 08/15/19 0100

## 2019-08-15 ENCOUNTER — Emergency Department (HOSPITAL_COMMUNITY): Payer: Medicare Other

## 2019-08-15 LAB — D-DIMER, QUANTITATIVE: D-Dimer, Quant: 0.28 ug/mL-FEU (ref 0.00–0.50)

## 2019-08-15 LAB — TROPONIN I (HIGH SENSITIVITY): Troponin I (High Sensitivity): 3 ng/L (ref <18)

## 2019-08-15 MED ORDER — IOHEXOL 350 MG/ML SOLN
100.0000 mL | Freq: Once | INTRAVENOUS | Status: AC | PRN
  Administered 2019-08-15: 05:00:00 100 mL via INTRAVENOUS

## 2019-08-15 MED ORDER — SODIUM CHLORIDE (PF) 0.9 % IJ SOLN
INTRAMUSCULAR | Status: AC
  Filled 2019-08-15: qty 50

## 2019-08-15 MED ORDER — ONDANSETRON HCL 4 MG PO TABS
4.0000 mg | ORAL_TABLET | Freq: Three times a day (TID) | ORAL | 0 refills | Status: DC | PRN
Start: 2019-08-15 — End: 2020-07-21

## 2019-08-15 NOTE — Discharge Instructions (Addendum)
Your testing is reassuring.  There is no evidence of bowel obstruction.  Your CT scan did show calcium deposits in your gallbladder which do not seem to be contributing to your pain at this time.  Is however like to become a problem in the future and you should follow-up with the surgeon to see if the gallbladder needs to come out.  Return to the ED with worsening pain, fever, chest pain, shortness of breath, any other concerns

## 2019-08-15 NOTE — ED Notes (Signed)
Pt. Ambulated to the bathroom and back to her room without difficulty. Pt. Gait steady on her feet.

## 2019-08-15 NOTE — ED Provider Notes (Signed)
ED ECG REPORT   Date: 08/15/2019  Rate: 81  Rhythm: normal sinus rhythm  QRS Axis: normal  Intervals: normal  ST/T Wave abnormalities: normal  Conduction Disutrbances:none  Narrative Interpretation:   Old EKG Reviewed: none available  I have personally reviewed the EKG tracing and agree with the computerized printout as noted.  Care assumed from Dr. Rex Kras.  Patient with ILD on 3 L home O2, diabetes, chronic myeloid leukemia here with left-sided flank pain going to her left upper quadrant with nausea and vomiting.  Work-up shows negative urinalysis.  CT renal study is negative.  EKG is sinus rhythm and unchanged.  Troponin and D-dimer negative.  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  She is describing mostly epigastric and left upper quadrant tenderness.  No further vomiting in the ED.  CT scan is negative for other acute pathology.  No evidence of aortic syndrome or pulmonary embolism.  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  He agrees this is likely not the cause of her abdominal pain tonight.  Given her chronic comorbidities it is likely that no surgeon will operate on her until this becomes a problem.  He states this can be watched and patient can refer to surgery as necessary.  Unclear etiology of patient's vomiting.  May be related to gastroparesis or possibly passed kidney stone.  No evidence of UTI or bowel obstruction.  No further vomiting in the ED.  She is tolerating p.o. Troponin negative with low suspicion for ACS and unchanged EKG.  We will treat symptomatically with antibiotics.  Follow-up with PCP and general surgery.  Return precautions discussed.   Ezequiel Essex, MD 08/15/19 [phone]

## 2019-08-20 ENCOUNTER — Encounter: Payer: Self-pay | Admitting: Pulmonary Disease

## 2019-08-20 ENCOUNTER — Other Ambulatory Visit: Payer: Self-pay

## 2019-08-20 ENCOUNTER — Ambulatory Visit: Payer: Medicare Other | Admitting: Pulmonary Disease

## 2019-08-20 VITALS — BP 118/72 | HR 78 | Temp 98.0°F | Ht 64.5 in | Wt 264.8 lb

## 2019-08-20 DIAGNOSIS — J849 Interstitial pulmonary disease, unspecified: Secondary | ICD-10-CM | POA: Diagnosis not present

## 2019-08-20 NOTE — Progress Notes (Signed)
Kathy Irwin    [id number]    [birth year]  Primary Care Physician:Becker, Mancel Bale, PA  Referring Physician: Kerin Perna, NP [address] 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  Symptoms started in 2012 when she was hospitalized for 7 days with respiratory failure.  Her primary pulmonologist is Dr. Priscille Loveless at [facility].  Work-up included normal echocardiogram, bronchoscopy with transbronchial biopsy in 2012 showing mild chronic interstitial inflammation with no granulomas.  CD4/CD8 ratio of 1.8. PFTs showing mild restriction with TLC 70%, severe diffusion abnormality at 38%.  She is also evaluated at [facility] in [place] where she was referred to cardiothoracic surgery in 2013 for VATS wedge biopsy however since symptoms responded well to prednisone this was deferred.  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  She was evaluated by rheumatology and told there is no evidence of rheumatoid arthritis.  She has been on intermittent prednisone since then  History notable for hospitalization at [facility] for acute respiratory failure with pulmonary infiltrates in September 2019 when she was visiting family in [place].  Treated with short course of steroids, bronchodilators  Last seen by Dr. Priscille Loveless on June 25, 2018 for interstitial lung disease of unclear etiology.   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  Bronchoscope was recommended at that time but unable to be scheduled  She moved to [facility] in July 2020 to  be closer to her children. Hospitalized for COVID-19 in September 2020.  Treated with steroids, remdesivir and Actemra x2.  She was hospitalized again in September 2020 with hypoxic respiratory failure secondary to ILD flare, acute diastolic heart failure.  Discharged on a slow prednisone taper over 6 weeks.  As she is got of the prednisone her dyspnea is increased again with increasing lower extremity edema.    Put back on prednisone and underwent bronchoscope on 04/02/2019 with lymphocytosis on BAL, transbronchial lung biopsies which were benign no granulomas Discussed at ILD conference on 04/26/2019 given diagnosis of chronic hypersensitivity pneumonitis. She is being evaluated for thyroidectomy after work-up for thyroid nodules. 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  Pets: No pets, cats, birds, farm animals Occupation: Used to run a daycare center Exposures: May have mold in the bathroom in her prior house.  No ongoing exposures.  No hot tub, Jacuzzi.  No feather pillows or comforters Smoking history: Never smoker Travel history: Has not lived outside [place].  No significant recent travel Relevant family history: No significant family history of lung disease  Interim history: At last visit we tapered off prednisone. Hospitalized in April 2021 with respiratory failure secondary to CHF with lower extremity edema.  Diuresed 5 L with improvement.  She also received a prednisone burst and doxycycline for suspected bronchitis  Post discharge she continues on Lasix, Zaroxolyn.  Currently off prednisone.  Outpatient Encounter Medications as of 08/20/2019  Medication Sig  . acetaminophen (TYLENOL) 500 MG tablet Take 1,000 mg by mouth 2 (two) times daily as needed (for pain).  Marland Kitchen albuterol (VENTOLIN HFA) 108 (90 Base) MCG/ACT inhaler Inhale 1-2 puffs into the lungs every 6 (six) hours as needed for wheezing or shortness of  breath.  . Azelastine-Fluticasone (DYMISTA) 137-50 MCG/ACT SUSP Place 1-2 sprays  into both nostrils daily as needed (for allergies).  . blood glucose meter kit and supplies KIT Dispense based on patient and insurance preference. Use up to four times daily as directed. (FOR ICD-9 250.00, 250.01). For QAC - HS accuchecks.  . CVS D3 125 MCG (5000 UT) capsule Take 5,000 Units by mouth daily.  Marland Kitchen dextromethorphan 15 MG/5ML syrup Take 10 mLs (30 mg total) by mouth 4 (four) times daily as needed for cough.  . fluticasone (FLONASE) 50 MCG/ACT nasal spray Place 2 sprays into both nostrils daily as needed for allergies or rhinitis.   . furosemide (LASIX) 40 MG tablet Take 40 mg by mouth daily as needed for fluid.   Marland Kitchen gabapentin (NEURONTIN) 300 MG capsule Take 1 capsule (300 mg total) by mouth 3 (three) times daily. (Patient taking differently: Take 300 mg by mouth 3 (three) times daily as needed (pain). )  . glimepiride (AMARYL) 2 MG tablet Take 1 tablet (2 mg total) by mouth daily with breakfast.  . glucose blood (FREESTYLE LITE) test strip For glucose testing every before meals at bedtime. Diagnosis E 11.65  Can substitute to any accepted brand  . Insulin Lispro Prot & Lispro (HUMALOG MIX 75/25 KWIKPEN) (75-25) 100 UNIT/ML Kwikpen Inject 80 Units into the skin See admin instructions. Inject 50 units into the skin before breakfast "and a second dose at bedtime 30 units (Patient taking differently: Inject 30-50 Units into the skin See admin instructions. Inject 50 units into the skin before breakfast and a second dose at bedtime 30 units)  . levocetirizine (XYZAL) 5 MG tablet Take 1 tablet by mouth daily as needed for allergies.   . metolazone (ZAROXOLYN) 2.5 MG tablet Take 2.5 mg by mouth three times a week, take 30 minutes before taking lasix  . metoprolol (TOPROL-XL) 200 MG 24 hr tablet Take 200 mg by mouth daily.  . montelukast (SINGULAIR) 10 MG tablet Take 10 mg by mouth at bedtime.   Marland Kitchen olopatadine (PATANOL) 0.1 % ophthalmic solution Place 1 drop into both eyes 2 (two) times daily.  (Patient taking differently: Place 1 drop into both eyes 2 (two) times daily as needed for allergies. )  . omeprazole (PRILOSEC) 20 MG capsule Take 1 capsule (20 mg total) by mouth daily. (Patient taking differently: Take 20 mg by mouth daily as needed (GERD). )  . ondansetron (ZOFRAN) 4 MG tablet Take 1 tablet (4 mg total) by mouth every 8 (eight) hours as needed for nausea or vomiting.  . pravastatin (PRAVACHOL) 40 MG tablet Take 40 mg by mouth at bedtime.  . sertraline (ZOLOFT) 100 MG tablet Take 100 mg by mouth daily as needed (Anxiety).  . sodium chloride (OCEAN) 0.65 % SOLN nasal spray Place 1 spray into both nostrils as needed for congestion.  Marland Kitchen tiZANidine (ZANAFLEX) 4 MG tablet Take 1 tablet (4 mg total) by mouth every 6 (six) hours as needed for muscle spasms (back pain).   No facility-administered encounter medications on file as of 08/20/2019.   Physical Exam: Blood pressure 118/72, pulse 78, temperature 98 F (36.7 C), temperature source Temporal, height 5' 4.5" (1.638 m), weight 264 lb 12.8 oz (120.1 kg), SpO2 99 %. Gen:      No acute distress HEENT:  EOMI, sclera anicteric Neck:     No masses; no thyromegaly Lungs:    Clear to auscultation bilaterally; normal respiratory effort CV:  Regular rate and rhythm; no murmurs Abd:      + bowel sounds; soft, non-tender; no palpable masses, no distension Ext:    No edema; adequate peripheral perfusion Skin:      Warm and dry; no rash Neuro: alert and oriented x 3 Psych: normal mood and affect  Data Reviewed: Imaging: High-resolution CT 02/11/2019-patchy areas of groundglass attenuation, septal thickening bilaterally with no basal gradient.  No traction bronchiectasis or honeycombing.  Moderate air trapping.  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 CTA 08/15/19-no pulmonary embolism.  Chronic interstitial changes as noted before. Reviewed the images personally.  Labs: Work-up at [facility] 2013-rheumatoid factor 52, hypersensitivity panel  positive for Aspergillus fumigatus, aureobacterium pullulans Negative aldolase, ANA, ENA, SCL 70, Smith, CCP, RNP, SSA  Repeat CTD serologies/16/20-ANA, myositis panel, SCL 70, Ro, La, hypersensitivity panel, ANCA-negative Rheumatoid factor-25  Bronchoscopy 04/02/2019-cell count 450, 92% lymphs Microbiology -negative Bronchial biopsies-bronchial mucosa with no significant findings  Cardiac: Echo 08/06/2019-hyperdynamic LV, LVEF 70-75%, moderate pulmonary hypertension with RVSP 51  Outside records from Dr. Priscille Loveless High-res CT chest 02/23/2018-hazy groundglass.  More pronounced on expiration images.  Worsened since 2014.  Findings suggest chronic interstitial pneumonitis consistent with history of hypersensitivity pneumonitis.  PFTs 01/16/2018 FVC 1.76 [60%], FEV1 1.16 (54%), F/F 66, TLC 4.52 (77%), DLCO 27.4 [21%] Poor quality spirometry.  Patient unable to do correct DLCO hence test is uninterpretable  Echo 06/21/2017-LVEF normal, normal RV size and function.  Mild TR.  Sleep study 07/27/2017-no significant sleep apnea although very loud snoring and severe desaturation.  Desaturations responsive to 2 L oxygen.  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  Differential is CTD associated NSIP fibrosis versus hypersensitivity pneumonitis.  Noted to have elevated RA but told her she does not have rheumatoid arthritis by rheumatologist.  She recently moved from [facility] in July 2020 and is living in a new house with no apparent ongoing exposures  Clinical picture is complicated by recent [id number] pneumonia which may have exacerbated her interstitial lung disease. Continues to have significant dyspnea.  BNP is normal, no evidence of pulmonary hypertension on echocardiogram.  She is not a candidate for surgical lung biopsy given obesity,  severely reduced diffusion capacity Underwent bronchoscopy on 12/16 Results discussed at multidisciplinary conference and felt this is hypersensitivity pneumonitis with moderate certainty given CT findings and lymphocytosis on BAL  We ordered CellCept but patient is reluctant to take due to low blood counts (thrombocytopenia) and possible side effect of immune suppression.  Given these concerns she is not a candidate for other immunosuppressive agent such as azathioprine as well  We have weaned her off prednisone.  Hopefully as she is recovering from COVID-19 and with no presumed ongoing exposures in her new home she will remain exacerbation free  Lower extremity edema, diastolic heart failure Pulmonary hypertension Currently on Lasix and Zaroxolyn.  Follow up with cardiology  Plan/Recommendations: - Observe off prednisone - Follow-up in 3 months with PFTs.  Marshell Garfinkel MD [facility] 08/20/2019, 2:22 PM  CC: Kerin Perna, NP

## 2019-08-20 NOTE — Patient Instructions (Signed)
I am glad you are doing well post discharge Continue with the water pills as prescribed You may need an extra dose if there is any increase in swelling of your legs We will continue to observe you off steroids and Bactrim Follow-up in 3 months.

## 2019-09-02 ENCOUNTER — Other Ambulatory Visit: Payer: Self-pay

## 2019-09-02 ENCOUNTER — Other Ambulatory Visit: Payer: Self-pay | Admitting: *Deleted

## 2019-09-02 ENCOUNTER — Ambulatory Visit (INDEPENDENT_AMBULATORY_CARE_PROVIDER_SITE_OTHER): Payer: Medicare Other | Admitting: Primary Care

## 2019-09-02 ENCOUNTER — Encounter (INDEPENDENT_AMBULATORY_CARE_PROVIDER_SITE_OTHER): Payer: Self-pay | Admitting: Primary Care

## 2019-09-02 VITALS — BP 123/78 | HR 82 | Temp 97.2°F | Ht 64.0 in | Wt 267.0 lb

## 2019-09-02 DIAGNOSIS — I10 Essential (primary) hypertension: Secondary | ICD-10-CM

## 2019-09-02 DIAGNOSIS — G894 Chronic pain syndrome: Secondary | ICD-10-CM | POA: Diagnosis not present

## 2019-09-02 DIAGNOSIS — M5431 Sciatica, right side: Secondary | ICD-10-CM

## 2019-09-02 DIAGNOSIS — E119 Type 2 diabetes mellitus without complications: Secondary | ICD-10-CM | POA: Diagnosis not present

## 2019-09-02 DIAGNOSIS — E66813 Obesity, class 3: Secondary | ICD-10-CM

## 2019-09-02 DIAGNOSIS — R600 Localized edema: Secondary | ICD-10-CM

## 2019-09-02 MED ORDER — GLIMEPIRIDE 4 MG PO TABS: 4.0000 mg | ORAL_TABLET | Freq: Every day | ORAL | 1 refills | Status: DC

## 2019-09-02 MED ORDER — PREGABALIN 50 MG PO CAPS: 50.0000 mg | ORAL_CAPSULE | Freq: Three times a day (TID) | ORAL | 0 refills | Status: DC

## 2019-09-02 MED ORDER — DIPHENHYDRAMINE HCL 25 MG PO TABS
25.0000 mg | ORAL_TABLET | Freq: Every evening | ORAL | 1 refills | Status: DC | PRN
Start: 2019-09-02 — End: 2019-12-01

## 2019-09-02 MED ORDER — METOPROLOL SUCCINATE ER 200 MG PO TB24: 200.0000 mg | ORAL_TABLET | Freq: Every day | ORAL | 1 refills | Status: DC

## 2019-09-02 NOTE — Progress Notes (Signed)
Established Patient Office Visit  Subjective:  Patient ID: Kathy Irwin, female    DOB: 1950-09-15  Age: 69 y.o. MRN: [id number]  CC:  Chief Complaint  Patient presents with  . Hospitalization Follow-up    CHF  . Sciatica    on right side     HPI [name] is a 69 year old female presents for hospitalization, Congestion heart failure and right sided pain. She presented 08/14/2019 to the emergency room with  sudden onset of severe left flank pain that wraps around to her left upper quadrant. Pain has been waxing and waning but persistent all day and nausea. She was hospitalized 08/01/2019 for CHF exacerbation, discharged home on 4/27  Past Medical History:  Diagnosis Date  . Acute respiratory failure with hypoxia ([place]) 12/2018  . Diabetes mellitus without complication ([place])   . Hypersensitivity pneumonitis ([place]) 12/19/2017  . Hypertension   . ILD (interstitial lung disease) ([place]) 12/24/2018    Past Surgical History:  Procedure Laterality Date  . ABDOMINAL HYSTERECTOMY    . VIDEO BRONCHOSCOPY Bilateral 04/02/2019   Procedure: VIDEO BRONCHOSCOPY WITH FLUORO;  Surgeon: Marshell Garfinkel, MD;  Location: [facility];  Service: Cardiopulmonary;  Laterality: Bilateral;    Family History  Problem Relation Age of Onset  . Diabetes Other   . Hypertension Other     Social History   Socioeconomic History  . Marital status: Married    Spouse name: Not on file  . Number of children: Not on file  . Years of education: Not on file  . Highest education level: Not on file  Occupational History  . Not on file  Tobacco Use  . Smoking status: Never Smoker  . Smokeless tobacco: Never Used  Substance and Sexual Activity  . Alcohol use: Never  . Drug use: Never  . Sexual activity: Not on file  Other Topics Concern  . Not on file  Social History Narrative  . Not on file   Social Determinants of Health   Financial Resource Strain:   . Difficulty of Paying Living Expenses:    Food Insecurity:   . Worried About Charity fundraiser in the Last Year:   . Arboriculturist in the Last Year:   Transportation Needs:   . Film/video editor (Medical):   Marland Kitchen Lack of Transportation (Non-Medical):   Physical Activity:   . Days of Exercise per Week:   . Minutes of Exercise per Session:   Stress:   . Feeling of Stress :   Social Connections:   . Frequency of Communication with Friends and Family:   . Frequency of Social Gatherings with Friends and Family:   . Attends Religious Services:   . Active Member of Clubs or Organizations:   . Attends Archivist Meetings:   Marland Kitchen Marital Status:   Intimate Partner Violence:   . Fear of Current or Ex-Partner:   . Emotionally Abused:   Marland Kitchen Physically Abused:   . Sexually Abused:     Outpatient Medications Prior to Visit  Medication Sig Dispense Refill  . acetaminophen (TYLENOL) 500 MG tablet Take 1,000 mg by mouth 2 (two) times daily as needed (for pain).    Marland Kitchen albuterol (VENTOLIN HFA) 108 (90 Base) MCG/ACT inhaler Inhale 1-2 puffs into the lungs every 6 (six) hours as needed for wheezing or shortness of breath. 8 g 0  . Azelastine-Fluticasone (DYMISTA) 137-50 MCG/ACT SUSP Place 1-2 sprays into both nostrils daily as needed (for allergies). [place]  g 1  . blood glucose meter kit and supplies KIT Dispense based on patient and insurance preference. Use up to four times daily as directed. (FOR ICD-9 250.00, 250.01). For QAC - HS accuchecks. 1 each 1  . CVS D3 125 MCG (5000 UT) capsule Take 5,000 Units by mouth daily.    Marland Kitchen dextromethorphan 15 MG/5ML syrup Take 10 mLs (30 mg total) by mouth 4 (four) times daily as needed for cough. 118 mL 0  . fluticasone (FLONASE) 50 MCG/ACT nasal spray Place 2 sprays into both nostrils daily as needed for allergies or rhinitis.     Marland Kitchen glucose blood (FREESTYLE LITE) test strip For glucose testing every before meals at bedtime. Diagnosis E 11.65  Can substitute to any accepted brand 100 each 0  .  Insulin Lispro Prot & Lispro (HUMALOG MIX 75/25 KWIKPEN) (75-25) 100 UNIT/ML Kwikpen Inject 80 Units into the skin See admin instructions. Inject 50 units into the skin before breakfast "and a second dose at bedtime 30 units (Patient taking differently: Inject 30-50 Units into the skin See admin instructions. Inject 50 units into the skin before breakfast and a second dose at bedtime 30 units) 30 mL 3  . levocetirizine (XYZAL) 5 MG tablet Take 1 tablet by mouth daily as needed for allergies.     . metolazone (ZAROXOLYN) 2.5 MG tablet Take 2.5 mg by mouth three times a week, take 30 minutes before taking lasix 30 tablet 5  . montelukast (SINGULAIR) 10 MG tablet Take 10 mg by mouth at bedtime.     Marland Kitchen olopatadine (PATANOL) 0.1 % ophthalmic solution Place 1 drop into both eyes 2 (two) times daily. (Patient taking differently: Place 1 drop into both eyes 2 (two) times daily as needed for allergies. ) 5 mL 0  . omeprazole (PRILOSEC) 20 MG capsule Take 1 capsule (20 mg total) by mouth daily. (Patient taking differently: Take 20 mg by mouth daily as needed (GERD). ) 30 capsule 0  . ondansetron (ZOFRAN) 4 MG tablet Take 1 tablet (4 mg total) by mouth every 8 (eight) hours as needed for nausea or vomiting. 12 tablet 0  . pravastatin (PRAVACHOL) 40 MG tablet Take 40 mg by mouth at bedtime.    . sodium chloride (OCEAN) 0.65 % SOLN nasal spray Place 1 spray into both nostrils as needed for congestion. 15 mL 0  . furosemide (LASIX) 40 MG tablet Take 40 mg by mouth daily as needed for fluid.     Marland Kitchen gabapentin (NEURONTIN) 300 MG capsule Take 1 capsule (300 mg total) by mouth 3 (three) times daily. (Patient taking differently: Take 300 mg by mouth 3 (three) times daily as needed (pain). ) 270 capsule 1  . glimepiride (AMARYL) 2 MG tablet Take 1 tablet (2 mg total) by mouth daily with breakfast. 90 tablet 1  . metoprolol (TOPROL-XL) 200 MG 24 hr tablet Take 200 mg by mouth daily.    . sertraline (ZOLOFT) 100 MG tablet Take  100 mg by mouth daily as needed (Anxiety).    Marland Kitchen tiZANidine (ZANAFLEX) 4 MG tablet Take 1 tablet (4 mg total) by mouth every 6 (six) hours as needed for muscle spasms (back pain). 30 tablet 0   No facility-administered medications prior to visit.    Allergies  Allergen Reactions  . Other Shortness Of Breath and Other (See Comments)    Newspaper ink =  new chest pain, also  . Iodine Other (See Comments)    "Was a long time ago" (Reaction??)  .  Merbromin Other (See Comments)    Mercurochrome- "Was a long time ago" (Reaction??)    ROS Review of Systems  Musculoskeletal:       Right sided pain   All other systems reviewed and are negative.     Objective:    Physical Exam  Constitutional: She is oriented to person, place, and time. She appears well-developed and well-nourished.  HENT:  Head: Normocephalic and atraumatic.  Cardiovascular: Normal rate and regular rhythm.  Pulmonary/Chest: Effort normal and breath sounds normal.  Abdominal: Soft. Bowel sounds are normal.  Musculoskeletal:        General: Normal range of motion.     Cervical back: Normal range of motion and neck supple.  Neurological: She is alert and oriented to person, place, and time.  Skin: Skin is warm and dry.  Psychiatric: She has a normal mood and affect. Her behavior is normal. Judgment and thought content normal.    BP 123/78 (BP Location: Left Arm, Patient Position: Sitting, Cuff Size: Large)   Pulse 82   Temp (!) 97.2 F (36.2 C) (Temporal)   Ht _0  (1.626 m)   Wt 267 lb (121.1 kg)   SpO2 97%   BMI 45.83 kg/m  Wt Readings from Last 3 Encounters:  09/02/19 267 lb (121.1 kg)  08/20/19 264 lb 12.8 oz (120.1 kg)  08/14/19 258 lb (117 kg)     Health Maintenance Due  Topic Date Due  . Hepatitis C Screening  Never done  . FOOT EXAM  Never done  . URINE MICROALBUMIN  Never done  . COVID-19 Vaccine (1) Never done  . TETANUS/TDAP  Never done  . MAMMOGRAM  Never done  . COLONOSCOPY  Never  done  . DEXA SCAN  Never done  . PNA vac Low Risk Adult (1 of 2 - PCV13) Never done    There are no preventive care reminders to display for this patient.  Lab Results  Component Value Date   TSH 2.060 04/04/2019   Lab Results  Component Value Date   WBC 4.9 09/05/2019   HGB 13.0 09/05/2019   HCT 41.3 09/05/2019   MCV 100.0 09/05/2019   PLT 90 (L) 09/05/2019   Lab Results  Component Value Date   NA 146 (H) 09/05/2019   K 3.5 09/05/2019   CO2 27 09/05/2019   GLUCOSE 90 09/05/2019   BUN 6 (L) 09/05/2019   CREATININE 0.72 09/05/2019   BILITOT 1.0 09/05/2019   ALKPHOS 76 09/05/2019   AST 18 09/05/2019   ALT 12 09/05/2019   PROT 7.2 09/05/2019   ALBUMIN 3.9 09/05/2019   CALCIUM 9.9 09/05/2019   ANIONGAP 8 09/05/2019   GFR 95.72 03/21/2019   Lab Results  Component Value Date   CHOL 166 04/04/2019   Lab Results  Component Value Date   HDL 48 04/04/2019   Lab Results  Component Value Date   LDLCALC 103 (H) 04/04/2019   Lab Results  Component Value Date   TRIG 80 04/04/2019   Lab Results  Component Value Date   CHOLHDL 3.5 04/04/2019   Lab Results  Component Value Date   HGBA1C 8.0 (H) 08/02/2019      Assessment & Plan:  Yehudis was seen today for hospitalization follow-up and sciatica.  Diagnoses and all orders for this visit:  Type 2 diabetes mellitus without complication, without long-term current use of insulin ([place]) ADA recommends the following therapeutic goals for glycemic control related to A1c measurements: Goal of therapy: Less than  6.5 hemoglobin A1c.  Reference clinical practice recommendations. Foods that are high in carbohydrates are the following rice, potatoes, breads, sugars, and pastas.  Reduction in the intake (eating) will assist in lowering your blood sugars. -     glimepiride (AMARYL) 4 MG tablet; Take 1 tablet (4 mg total) by mouth daily with breakfast.  Obesity, Class III, BMI 40-49.9 (morbid obesity) (HCC) Morbid Obesity is  indicating an excess in caloric intake or underlining conditions. This may lead to other co-morbidities. Lifestyle modifications of diet and exercise may reduce obesity.   Chronic pain syndrome -     Ambulatory referral to Pain Clinic  Right sciatic nerve pain -     Ambulatory referral to Pain Clinic -     AMB referral to orthopedics  Essential hypertension Counseled on blood pressure she is at  goal of less than 130/80, low-sodium, DASH diet, medication compliance, 150 minutes of moderate intensity exercise per week. Discussed medication compliance, adverse effects. -      Other orders -     pregabalin (LYRICA) 50 MG capsule; Take 1 capsule (50 mg total) by mouth 3 (three) times daily. -     metoprolol (TOPROL-XL) 200 MG 24 hr tablet; Take 1 tablet (200 mg total) by mouth daily. -     diphenhydrAMINE (BENADRYL) 25 MG tablet; Take 1 tablet (25 mg total) by mouth at bedtime as needed for itching.   Meds ordered this encounter  Medications  . pregabalin (LYRICA) 50 MG capsule    Sig: Take 1 capsule (50 mg total) by mouth 3 (three) times daily.    Dispense:  270 capsule    Refill:  0  . glimepiride (AMARYL) 4 MG tablet    Sig: Take 1 tablet (4 mg total) by mouth daily with breakfast.    Dispense:  90 tablet    Refill:  1  . metoprolol (TOPROL-XL) 200 MG 24 hr tablet    Sig: Take 1 tablet (200 mg total) by mouth daily.    Dispense:  90 tablet    Refill:  1  . diphenhydrAMINE (BENADRYL) 25 MG tablet    Sig: Take 1 tablet (25 mg total) by mouth at bedtime as needed for itching.    Dispense:  90 tablet    Refill:  1    Follow-up: Return in about 3 months (around 12/03/2019) for DM in person.    Kerin Perna, NP

## 2019-09-02 NOTE — Patient Instructions (Signed)

## 2019-09-04 ENCOUNTER — Other Ambulatory Visit: Payer: Self-pay | Admitting: *Deleted

## 2019-09-04 ENCOUNTER — Encounter: Payer: Medicare Other | Admitting: Vascular Surgery

## 2019-09-04 ENCOUNTER — Encounter (HOSPITAL_COMMUNITY): Payer: Medicare Other

## 2019-09-04 DIAGNOSIS — Z856 Personal history of leukemia: Secondary | ICD-10-CM

## 2019-09-04 DIAGNOSIS — D696 Thrombocytopenia, unspecified: Secondary | ICD-10-CM

## 2019-09-04 NOTE — Progress Notes (Signed)
HEMATOLOGY/ONCOLOGY CONSULTATION NOTE  Date of Service: 09/05/2019  Patient Care Team: Kerin Perna, NP as PCP - General (Internal Medicine) Troy Sine, MD as PCP - Cardiology (Cardiology)  CHIEF COMPLAINTS/PURPOSE OF CONSULTATION:  CMML Thrombocytopenia  HISTORY OF PRESENTING ILLNESS:   Kathy Irwin is a wonderful 69 y.o. 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 The pt reports that she is doing well overall.  The pt reports that she was diagnosed with CML by [facility] [place] six years ago. 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 They also did a BM Bx which showed that she had chronic leukemia but she is unsure what kind. She has not needed any treatments in 6 years and was only monitored with labs. Her RBC and WBC have remained within the normal range since her diagnosis. Previously her PLTs have gotten as low as 30K but have come back up on their own. She denies any pain or issues related to her leukemia within the last 6 years.   Pt had a recent Backus infection in August. She went back to the hospital on 09/07 with bacterial Pneumonia and was discharged on 09/10. Pt notes that her breathing has improved since that hospitalization but is not back to baseline. She was sent home with oxygen and has been using it. Pt has been using oxygen for about 8 years and she is not sure why it was given to her. Pt was previously diagnosed with Interstitial Lung Disease. She says that when her symptoms worsen she is given steroids for 6 weeks. She is currently on a course of Prednisone. Pt has had sleep apnea testing which revealed that she does not have sleep apnea. Her HTN and Diabetes have been well controlled and she has been taking her medications as prescribed. She also has home care who has been helping her walk and get some exercise. She will be going to see Dr. Ina Homes, a Pulmonologist, on 10/08.     Most recent lab results (01/09/2019) of CBC w/diff & CMP is as follows: all values are WNL except for RDW at 18.0, PLTs at 117K, nRBC Rel at 1, Glucose at 118.   On review of systems, pt denies back pain, abdominal pain and any other symptoms.   On PMHx the pt reports ILD, HTN, Diabetes, COVID19 infection, bacterial Pneumonia. On Social Hx the pt reports she is a non-smoker and does not drink alcohol  INTERVAL HISTORY:   Kathy Irwin is a wonderful 69 y.o. 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 The patient's last visit with [place] was on 04/23/19. The pt reports that she is doing well overall.  The pt reports she is good. She is in pain and has been in and out of the hospital. Pt fell Tuesday at her PCP office and she can barley walk. PCP said that she must see oncologist for her pain due to her platelets. She has arthritis and right leg is bruised. Pt does have some tingling/numbness in hands and feet but she does have cramping. She has not had any new medications. Pt was in hospital and had fluid in the lungs. She has been on oxygen at home and is seeing pulmonologist. Pt was on prednisone last month for infection in her lungs. She needs approval for thyroid.   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 fISH panel for myelodysplasia is negative for MDS related abnormalities, see CSI report [id number]. Molecular test for JAK2 is negative, see CSI report [id number]. Cytogenic Studies demonstrate a normal karyotype. See CSI report [id number]. Possibility of a low grade myelodysplastic process cannot be excluded other  considerations are heavy metal exposure, B12/folate deficiency, etc. "  Lab results today (09/05/19) of CBC w/diff and CMP is as follows: all values are WNL except for RDW at 19.9, Platelets at 90K, nRBC at 1.4, Neutro As at 1.2K, Monocytes Absolute at 1.1K, Sodium at 146, BUN at 6 09/05/19 of LDH at 438  On review of systems, pt reports cramping in hands and feet,healthy appetite, abdominal pain, fall and denies bleeding issues and any other symptoms.   MEDICAL HISTORY:  Past Medical History:  Diagnosis Date  . Acute respiratory failure with hypoxia ([place]) 12/2018  . Diabetes mellitus without complication ([place])   . 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 Hypertension   . ILD (interstitial lung disease) ([place]) 12/24/2018    SURGICAL HISTORY: Past Surgical History:  Procedure Laterality Date  . ABDOMINAL HYSTERECTOMY    . VIDEO BRONCHOSCOPY Bilateral 04/02/2019   Procedure: VIDEO BRONCHOSCOPY WITH FLUORO;  Surgeon: Marshell Garfinkel, MD;  Location: [facility];  Service: Cardiopulmonary;  Laterality: Bilateral;    SOCIAL HISTORY: Social History   Socioeconomic History  . Marital status: Married    Spouse name: Not on file  . Number of children: Not on file  . Years of education: Not on file  . Highest education level: Not on file  Occupational History  . Not on file  Tobacco Use  . Smoking status: Never Smoker  . Smokeless tobacco: Never Used  Substance and Sexual Activity  . Alcohol use: Never  . Drug use: Never  . Sexual activity: Not on file  Other Topics Concern  . Not on file  Social History Narrative  . Not on file   Social Determinants of Health   Financial Resource Strain:   . Difficulty of Paying Living Expenses:   Food Insecurity:   . Worried About Charity fundraiser in the Last Year:   . Arboriculturist in the Last Year:   Transportation Needs:   . Film/video editor (Medical):   Marland Kitchen Lack of Transportation (Non-Medical):   Physical Activity:     . Days of Exercise per Week:   . Minutes of Exercise per Session:   Stress:   . Feeling of Stress :   Social Connections:   . Frequency of Communication with Friends and Family:   . Frequency of Social Gatherings with Friends and Family:   . Attends Religious Services:   . Active Member of Clubs or Organizations:   . Attends Archivist Meetings:   Marland Kitchen Marital Status:   Intimate Partner Violence:   . Fear of Current or Ex-Partner:   . Emotionally Abused:   Marland Kitchen Physically Abused:   . Sexually Abused:     FAMILY HISTORY: Family History  Problem Relation Age of Onset  . Diabetes Other   . Hypertension Other     ALLERGIES:  is allergic to other; iodine; and merbromin.  MEDICATIONS:  Current Outpatient Medications  Medication Sig Dispense Refill  . acetaminophen (TYLENOL) 500 MG tablet Take 1,000  mg by mouth 2 (two) times daily as needed (for pain).    Marland Kitchen albuterol (VENTOLIN HFA) 108 (90 Base) MCG/ACT inhaler Inhale 1-2 puffs into the lungs every 6 (six) hours as needed for wheezing or shortness of breath. 8 g 0  . Azelastine-Fluticasone (DYMISTA) 137-50 MCG/ACT SUSP Place 1-2 sprays into both nostrils daily as needed (for allergies). 23 g 1  . blood glucose meter kit and supplies KIT Dispense based on patient and insurance preference. Use up to four times daily as directed. (FOR ICD-9 250.00, 250.01). For QAC - HS accuchecks. 1 each 1  . [employer] D3 125 MCG (5000 UT) capsule Take 5,000 Units by mouth daily.    Marland Kitchen dextromethorphan 15 MG/5ML syrup Take 10 mLs (30 mg total) by mouth 4 (four) times daily as needed for cough. 118 mL 0  . diphenhydrAMINE (BENADRYL) 25 MG tablet Take 1 tablet (25 mg total) by mouth at bedtime as needed for itching. 90 tablet 1  . fluticasone (FLONASE) 50 MCG/ACT nasal spray Place 2 sprays into both nostrils daily as needed for allergies or rhinitis.     Marland Kitchen glimepiride (AMARYL) 4 MG tablet Take 1 tablet (4 mg total) by mouth daily with breakfast. 90 tablet 1   . glucose blood (FREESTYLE LITE) test strip For glucose testing every before meals at bedtime. Diagnosis E 11.65  Can substitute to any accepted brand 100 each 0  . Insulin Lispro Prot & Lispro (HUMALOG MIX 75/25 KWIKPEN) (75-25) 100 UNIT/ML Kwikpen Inject 80 Units into the skin See admin instructions. Inject 50 units into the skin before breakfast "and a second dose at bedtime 30 units (Patient taking differently: Inject 30-50 Units into the skin See admin instructions. Inject 50 units into the skin before breakfast and a second dose at bedtime 30 units) 30 mL 3  . levocetirizine (XYZAL) 5 MG tablet Take 1 tablet by mouth daily as needed for allergies.     . metolazone (ZAROXOLYN) 2.5 MG tablet Take 2.5 mg by mouth three times a week, take 30 minutes before taking lasix 30 tablet 5  . metoprolol (TOPROL-XL) 200 MG 24 hr tablet Take 1 tablet (200 mg total) by mouth daily. 90 tablet 1  . montelukast (SINGULAIR) 10 MG tablet Take 10 mg by mouth at bedtime.     Marland Kitchen olopatadine (PATANOL) 0.1 % ophthalmic solution Place 1 drop into both eyes 2 (two) times daily. (Patient taking differently: Place 1 drop into both eyes 2 (two) times daily as needed for allergies. ) 5 mL 0  . omeprazole (PRILOSEC) 20 MG capsule Take 1 capsule (20 mg total) by mouth daily. (Patient taking differently: Take 20 mg by mouth daily as needed (GERD). ) 30 capsule 0  . ondansetron (ZOFRAN) 4 MG tablet Take 1 tablet (4 mg total) by mouth every 8 (eight) hours as needed for nausea or vomiting. 12 tablet 0  . pravastatin (PRAVACHOL) 40 MG tablet Take 40 mg by mouth at bedtime.    . pregabalin (LYRICA) 50 MG capsule Take 1 capsule (50 mg total) by mouth 3 (three) times daily. 270 capsule 0  . sodium chloride (OCEAN) 0.65 % SOLN nasal spray Place 1 spray into both nostrils as needed for congestion. 15 mL 0  . HYDROcodone-acetaminophen (NORCO) 5-325 MG tablet Take 1 tablet by mouth every 6 (six) hours as needed for moderate pain. 30 tablet  0   No current facility-administered medications for this visit.    REVIEW OF SYSTEMS:   A 10+  POINT REVIEW OF SYSTEMS WAS OBTAINED including neurology, dermatology, psychiatry, cardiac, respiratory, lymph, extremities, GI, GU, Musculoskeletal, constitutional, breasts, reproductive, HEENT.  All pertinent positives are noted in the HPI.  All others are negative.   PHYSICAL EXAMINATION: ECOG PERFORMANCE STATUS: 2 - Symptomatic, <50% confined to bed  . Vitals:   09/05/19 1207  BP: (!) 134/56  Pulse: 85  Resp: 18  Temp: 97.7 F (36.5 C)  SpO2: 96%   Filed Weights   .Body mass index is 45.83 kg/m.   Exam was given in a chair   GENERAL:alert, in no acute distress and comfortable SKIN: no acute rashes, no significant lesions EYES: conjunctiva are pink and non-injected, sclera anicteric OROPHARYNX: MMM, no exudates, no oropharyngeal erythema or ulceration NECK: supple, no JVD LYMPH:  no palpable lymphadenopathy in the cervical, axillary or inguinal regions LUNGS: clear to auscultation b/l with normal respiratory effort HEART: regular rate & rhythm ABDOMEN:  normoactive bowel sounds , non tender, not distended. Extremity: pedal edema PSYCH: alert & oriented x 3 with fluent speech NEURO: no focal motor/sensory deficits  LABORATORY DATA:  I have reviewed the data as listed  . CBC Latest Ref Rng & Units 09/05/2019 08/14/2019 08/12/2019  WBC 4.0 - 10.5 K/uL 4.9 9.3 16.9(H)  Hemoglobin 12.0 - 15.0 g/dL 13.0 14.1 13.5  Hematocrit 36.0 - 46.0 % 41.3 44.0 41.7  Platelets 150 - 400 K/uL 90(L) 227 185    . CMP Latest Ref Rng & Units 09/05/2019 08/14/2019 08/12/2019  Glucose 70 - 99 mg/dL 90 214(H) 185(H)  BUN 8 - 23 mg/dL 6(L) 23 27(H)  Creatinine 0.44 - 1.00 mg/dL 0.72 0.75 0.88  Sodium 135 - 145 mmol/L 146(H) 142 135  Potassium 3.5 - 5.1 mmol/L 3.5 3.4(L) 3.7  Chloride 98 - 111 mmol/L 111 98 90(L)  CO2 22 - 32 mmol/L 27 31 33(H)  Calcium 8.9 - 10.3 mg/dL 9.9 10.7(H) 10.4(H)    Total Protein 6.5 - 8.1 g/dL 7.2 8.1 -  Total Bilirubin 0.3 - 1.2 mg/dL 1.0 0.9 -  Alkaline Phos 38 - 126 U/L 76 59 -  AST 15 - 41 U/L 18 23 -  ALT 0 - 44 U/L 12 19 -   01/21/2019 FISH:    RADIOGRAPHIC STUDIES: I have personally reviewed the radiological images as listed and agreed with the findings in the report. DG Chest 2 View  Result Date: 08/11/2019 CLINICAL DATA:  69 year old female with cough and congestion. EXAM: CHEST - 2 VIEW COMPARISON:  Portable chest 08/07/2019 and earlier. FINDINGS: PA and lateral views. Stable lung volumes. Stable mild cardiomegaly. Visualized tracheal air column is within normal limits. Continued diffuse increased pulmonary interstitial opacity, with no pleural fluid evident. No pneumothorax or consolidation. Overall ventilation has not significantly changed. No acute osseous abnormality identified. Negative visible bowel gas pattern. IMPRESSION: 1. Ventilation not significantly changed from 08/01/2019 with diffuse basilar predominant interstitial markings which are favored due to interstitial lung disease (as suspected by high-resolution CT on 02/11/2019) rather than interstitial edema (no definite pleural fluid). 2. No new cardiopulmonary abnormality. Electronically Signed   By: Genevie Ann M.D.   On: 08/11/2019 08:39   DG CHEST PORT 1 VIEW  Result Date: 08/07/2019 CLINICAL DATA:  CHF. EXAM: PORTABLE CHEST 1 VIEW COMPARISON:  08/01/2019. FINDINGS: Mediastinum is stable. Heart size stable. Bilateral interstitial prominence suggesting interstitial edema again noted. Similar findings noted on prior exam. No significant pleural effusion. No pneumothorax. IMPRESSION: Bilateral interstitial prominence suggesting interstitial edema again noted. Pneumonitis cannot  be excluded. Similar findings noted on prior exam. Electronically Signed   By: Marcello Moores  Register   On: 08/07/2019 06:07   CT Renal Stone Study  Result Date: 08/15/2019 CLINICAL DATA:  Left flank pain. EXAM: CT  ABDOMEN AND PELVIS WITHOUT CONTRAST TECHNIQUE: Multidetector CT imaging of the abdomen and pelvis was performed following the standard protocol without IV contrast. COMPARISON:  None. FINDINGS: LOWER CHEST: Right basilar bronchiectasis. Bilateral lower lobe septal thickening. HEPATOBILIARY: Normal hepatic contours. No intra- or extrahepatic biliary dilatation. Porcelain gallbladder. PANCREAS: Normal pancreas. No ductal dilatation or peripancreatic fluid collection. SPLEEN: Normal. ADRENALS/URINARY TRACT: The adrenal glands are normal. No hydronephrosis, nephroureterolithiasis or solid renal mass. The urinary bladder is normal for degree of distention STOMACH/BOWEL: There is no hiatal hernia. Normal duodenal course and caliber. No small bowel dilatation or inflammation. No focal colonic abnormality. Normal appendix. VASCULAR/LYMPHATIC: There is calcific atherosclerosis of the abdominal aorta. Bilateral gonadal vein calcifications. No abdominal or pelvic lymphadenopathy. REPRODUCTIVE: Status post hysterectomy. No adnexal mass. MUSCULOSKELETAL. No bony spinal canal stenosis or focal osseous abnormality. OTHER: There is a fat containing ventral abdominal hernia. IMPRESSION: 1. No acute abnormality of the abdomen or pelvis. 2. Porcelain gallbladder. 3. Aortic Atherosclerosis (ICD10-I70.0). Electronically Signed   By: Ulyses Jarred M.D.   On: 08/15/2019 01:05   CT Angio Chest/Abd/Pel for Dissection W and/or Wo Contrast  Result Date: 08/15/2019 CLINICAL DATA:  Left-sided pain.  Acute aortic syndrome suspected EXAM: CT ANGIOGRAPHY CHEST, ABDOMEN AND PELVIS TECHNIQUE: Non-contrast CT of the chest was initially obtained. Multidetector CT imaging through the chest, abdomen and pelvis was performed using the standard protocol during bolus administration of intravenous contrast. Multiplanar reconstructed images and MIPs were obtained and reviewed to evaluate the vascular anatomy. CONTRAST:  160m OMNIPAQUE IOHEXOL 350 MG/ML  SOLN COMPARISON:  Noncontrast abdomen and pelvis CT was obtained earlier the same day FINDINGS: CTA CHEST FINDINGS Cardiovascular: Noncontrast phase shows no intramural hematoma at the level of the aorta. No aortic dissection or aneurysm. No acute finding in the great vessels. Limited assessment of the pulmonary arteries due to motion. No filling defect is seen. Mediastinum/Nodes: Negative for adenopathy mediastinal mass. Calcified left hilar lymph node. Stable appearance of the thyroid when compared to prior CT. There has also been interval ultrasound. Lungs/Pleura: Interstitial lung disease with patchy ground-glass opacity, areas of probable air trapping, and traction bronchiectasis. No overt honeycombing. Calcified granuloma in the left lower lobe. Musculoskeletal: No acute finding Review of the MIP images confirms the above findings. CTA ABDOMEN AND PELVIS FINDINGS VASCULAR Aorta: Atherosclerosis that is mild for age. No aneurysm or dissection. Celiac: Negative.  No flow limiting stenosis or branch occlusion. SMA: Unremarkable Renals: Single bilateral renal artery with a tiny accessory vessel on the left. No stenosis, beading, or aneurysm. IMA: Patent Inflow: Mild atheromatous changes without stenosis or dissection Veins: Unremarkable in the arterial phase Review of the MIP images confirms the above findings. NON-VASCULAR Hepatobiliary: No focal liver abnormality.Gallbladder calcification is previously noted; distally this has a mural appearance and more centrally calcifications may be luminal. Pancreas: Unremarkable. Spleen: Unremarkable. Adrenals/Urinary Tract: Negative adrenals. No hydronephrosis or stone. Left upper pole scarring. Unremarkable bladder. Stomach/Bowel:  No obstruction. No visible bowel inflammation. Lymphatic: No acute vascular abnormality.  No mass or adenopathy. Reproductive:Hysterectomy Other: No ascites or pneumoperitoneum.  Fatty midline hernia Musculoskeletal: No acute abnormalities.  Lumbar spine degeneration with foraminal stenoses. Review of the MIP images confirms the above findings. IMPRESSION: 1. Negative for acute aortic syndrome. 2.  Aortic  Atherosclerosis (ICD10-I70.0), mild for age. 3. Chronic interstitial lung disease. 4. Porcelain gallbladder Electronically Signed   By: Monte Fantasia M.D.   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 ITP is like since platelets improved significantly with recent steroid use.  PLAN:  -Discussed pt labwork today, 09/05/19; of CBC w/diff and CMP is as follows: all values are WNL except for RDW at 19.9, Platelets at 90K, nRBC at 1.4, Neutro As at 1.2K, Monocytes Absolute at 1.1K, Sodium at 146, BUN at 6 -Discussed 09/05/19 of LDH at 438 -Advised chronic pain magagment for non-cancer pain management  -Non-cancer pain is differed to PCP or PCP gives referral to pain clinic -Advised on CMML- increase production of certain types of cells- monocytes, not makes platelets  -Advised on treatment of CMML- cure bone marrow transplant, no indicated right now -will continue to watch -Advised on vidaza treatment, not indicated currently -Advised on abnormal antibodies due to responding to prednisone  -Advised on possibility to ITP -treat if platelets at <50K -Advised may need repeat bone marrow examination  -Advised overtime can evolve into acute leukemia, affect RBC and other cells, scaring in bone marrow over time, enlarged spleen  -Will give one time-pain medications for acute leg pain due to fall -continue f/u with pulmonologist -may proceed for planned thyroid surgery from hematology standpoint since PLT>50K-currently 2 90k. If platelet drop to <50K might need to consider IVIG -Will see back in 4 months   FOLLOW UP: RTC with Dr Irene Limbo with labs in 4 months  The total time spent in the appt was 30 minutes and more than 50% was on counseling and direct patient cares.  All of  the patient's questions were answered with apparent satisfaction. The patient knows to call the clinic with any problems, questions or concerns.  Sullivan Lone MD MS AAHIVMS [facility] Hematology/Oncology Physician [facility]  (Office):       [phone] (Work cell):  [phone] (Fax):           [phone]  09/05/2019 12:48 PM  I, Dawayne Cirri am acting as a Education administrator for Dr. Sullivan Lone.   .I have reviewed the above documentation for accuracy and completeness, and I agree with the above. Brunetta Genera MD

## 2019-09-05 ENCOUNTER — Other Ambulatory Visit: Payer: Self-pay

## 2019-09-05 ENCOUNTER — Inpatient Hospital Stay: Payer: Medicare Other

## 2019-09-05 ENCOUNTER — Inpatient Hospital Stay: Payer: Medicare Other | Attending: Hematology | Admitting: Hematology

## 2019-09-05 VITALS — BP 134/56 | HR 85 | Temp 97.7°F | Resp 18 | Ht 64.0 in

## 2019-09-05 DIAGNOSIS — Z856 Personal history of leukemia: Secondary | ICD-10-CM

## 2019-09-05 DIAGNOSIS — C9310 Chronic myelomonocytic leukemia not having achieved remission: Secondary | ICD-10-CM | POA: Insufficient documentation

## 2019-09-05 DIAGNOSIS — I110 Hypertensive heart disease with heart failure: Secondary | ICD-10-CM | POA: Diagnosis not present

## 2019-09-05 DIAGNOSIS — R109 Unspecified abdominal pain: Secondary | ICD-10-CM | POA: Insufficient documentation

## 2019-09-05 DIAGNOSIS — D696 Thrombocytopenia, unspecified: Secondary | ICD-10-CM

## 2019-09-05 DIAGNOSIS — R202 Paresthesia of skin: Secondary | ICD-10-CM | POA: Insufficient documentation

## 2019-09-05 DIAGNOSIS — J849 Interstitial pulmonary disease, unspecified: Secondary | ICD-10-CM | POA: Diagnosis not present

## 2019-09-05 DIAGNOSIS — R200 Anesthesia of skin: Secondary | ICD-10-CM | POA: Diagnosis not present

## 2019-09-05 DIAGNOSIS — D693 Immune thrombocytopenic purpura: Secondary | ICD-10-CM

## 2019-09-05 DIAGNOSIS — Z794 Long term (current) use of insulin: Secondary | ICD-10-CM | POA: Insufficient documentation

## 2019-09-05 DIAGNOSIS — Z79899 Other long term (current) drug therapy: Secondary | ICD-10-CM | POA: Diagnosis not present

## 2019-09-05 DIAGNOSIS — E119 Type 2 diabetes mellitus without complications: Secondary | ICD-10-CM | POA: Diagnosis not present

## 2019-09-05 DIAGNOSIS — Z8616 Personal history of COVID-19: Secondary | ICD-10-CM | POA: Diagnosis not present

## 2019-09-05 LAB — CMP (CANCER CENTER ONLY)
ALT: 12 U/L (ref 0–44)
AST: 18 U/L (ref 15–41)
Albumin: 3.9 g/dL (ref 3.5–5.0)
Alkaline Phosphatase: 76 U/L (ref 38–126)
Anion gap: 8 (ref 5–15)
BUN: 6 mg/dL — ABNORMAL LOW (ref 8–23)
CO2: 27 mmol/L (ref 22–32)
Calcium: 9.9 mg/dL (ref 8.9–10.3)
Chloride: 111 mmol/L (ref 98–111)
Creatinine: 0.72 mg/dL (ref 0.44–1.00)
GFR, Est AFR Am: >60 mL/min (ref >60)
GFR, Estimated: >60 mL/min (ref >60)
Glucose, Bld: 90 mg/dL (ref 70–99)
Potassium: 3.5 mmol/L (ref 3.5–5.1)
Sodium: 146 mmol/L — ABNORMAL HIGH (ref 135–145)
Total Bilirubin: 1 mg/dL (ref 0.3–1.2)
Total Protein: 7.2 g/dL (ref 6.5–8.1)

## 2019-09-05 LAB — CBC WITH DIFFERENTIAL (CANCER CENTER ONLY)
Abs Immature Granulocytes: 0.02 10*3/uL (ref 0.00–0.07)
Basophils Absolute: 0 10*3/uL (ref 0.0–0.1)
Basophils Relative: 0 %
Eosinophils Absolute: 0 10*3/uL (ref 0.0–0.5)
Eosinophils Relative: 0 %
HCT: 41.3 % (ref 36.0–46.0)
Hemoglobin: 13 g/dL (ref 12.0–15.0)
Immature Granulocytes: 0 %
Lymphocytes Relative: 52 %
Lymphs Abs: 2.6 10*3/uL (ref 0.7–4.0)
MCH: 31.5 pg (ref 26.0–34.0)
MCHC: 31.5 g/dL (ref 30.0–36.0)
MCV: 100 fL (ref 80.0–100.0)
Monocytes Absolute: 1.1 10*3/uL — ABNORMAL HIGH (ref 0.1–1.0)
Monocytes Relative: 23 %
Neutro Abs: 1.2 10*3/uL — ABNORMAL LOW (ref 1.7–7.7)
Neutrophils Relative %: 25 %
Platelet Count: 90 10*3/uL — ABNORMAL LOW (ref 150–400)
RBC: 4.13 MIL/uL (ref 3.87–5.11)
RDW: 19.9 % — ABNORMAL HIGH (ref 11.5–15.5)
WBC Count: 4.9 10*3/uL (ref 4.0–10.5)
nRBC: 1.4 % — ABNORMAL HIGH (ref 0.0–0.2)

## 2019-09-05 LAB — LACTATE DEHYDROGENASE: LDH: 438 U/L — ABNORMAL HIGH (ref 98–192)

## 2019-09-05 LAB — SAMPLE TO BLOOD BANK

## 2019-09-05 MED ORDER — HYDROCODONE-ACETAMINOPHEN 5-325 MG PO TABS: 1.0000 | ORAL_TABLET | Freq: Four times a day (QID) | ORAL | 0 refills | Status: DC | PRN

## 2019-09-08 ENCOUNTER — Ambulatory Visit (INDEPENDENT_AMBULATORY_CARE_PROVIDER_SITE_OTHER): Payer: Medicare Other

## 2019-09-08 ENCOUNTER — Other Ambulatory Visit: Payer: Self-pay

## 2019-09-08 DIAGNOSIS — Z111 Encounter for screening for respiratory tuberculosis: Secondary | ICD-10-CM

## 2019-09-09 ENCOUNTER — Ambulatory Visit: Payer: Self-pay

## 2019-09-09 ENCOUNTER — Ambulatory Visit: Payer: Medicare Other | Admitting: Cardiovascular Disease

## 2019-09-09 ENCOUNTER — Ambulatory Visit (INDEPENDENT_AMBULATORY_CARE_PROVIDER_SITE_OTHER): Payer: Medicare Other | Admitting: Family Medicine

## 2019-09-09 ENCOUNTER — Encounter: Payer: Self-pay | Admitting: Family Medicine

## 2019-09-09 ENCOUNTER — Telehealth: Payer: Self-pay | Admitting: Hematology

## 2019-09-09 DIAGNOSIS — M25561 Pain in right knee: Secondary | ICD-10-CM | POA: Diagnosis not present

## 2019-09-09 DIAGNOSIS — M545 Low back pain, unspecified: Secondary | ICD-10-CM

## 2019-09-09 NOTE — Telephone Encounter (Signed)
Scheduled per 05/21 los, patient has been called and notified.

## 2019-09-09 NOTE — Progress Notes (Signed)
Office Visit Note   Patient: Kathy Irwin           Date of Birth: 12-21-1950           MRN: [id number] Visit Date: 09/09/2019 Requested by: Kerin Perna, NP [address] [place],  [place] [address] PCP: Kerin Perna, NP  Subjective: Chief Complaint  Patient presents with  . Lower Back - Pain    Pain started 09/02/19: while walking up steps, pain started in the right lower back and radiated down to her hip, with a grabbing pain. The leg buckled and she fell. Pain in the right medial knee/thigh, especially w/WB activity. Abrasions lower leg.    HPI: She is here with low back and right leg pain.  Her low back started bothering her in the right posterior hip with radiation down the leg about 3 to 4 weeks ago.  Her PCP gave her gabapentin but it has not helped.  Recently she was given Lyrica but so far she has not noticed much difference.  1 week ago she was walking up steps, she turned in her leg buckled causing her to fall.  She sustained abrasions on her lower leg and has had severe pain on the medial side of her knee since then.  It feels like it is going to buckle when she walks now.               ROS: No bowel or bladder dysfunction, no fevers or chills, no rash.  All other systems were reviewed and are negative.  Objective: Vital Signs: There were no vitals taken for this visit.  Physical Exam:  General:  Alert and oriented, in no acute distress. Pulm:  Breathing unlabored. Psy:  Normal mood, congruent affect. Skin: She has abrasions on the front of her right shin which do not appear infected. Low back: She is tender in the right posterior hip.  Straight leg raise is equivocal.  She has intact extensor mechanism in the knee and ankle dorsiflexion, eversion, plantarflexion, and EHL testing are 5/5. Right knee has 1+ effusion with tenderness on the medial joint line.  She has pain with valgus stress and slight laxity.  Unable to assess ACL.   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  No sign of fracture or loose body.  XR Lumbar Spine 2-3 Views  Result Date: 09/09/2019 X-rays lumbar spine revealed moderate to severe L4-5 and L5-S1 degenerative disc disease.  She has milder changes in the upper lumbar spine.  No sign of acute fracture, no sign of neoplasm.   Assessment & Plan: 1.  Low back pain with right-sided sciatica -We discussed various options, she wants to try epidural steroid injection.  Could do physical therapy if symptoms persist.  2.  Left knee pain with flareup of end-stage DJD which was previously asymptomatic -Steroid injection today.  Follow-up as needed.     Procedures: Right knee steroid injection: After sterile prep with Betadine, injected 5 cc 1% lidocaine without epinephrine and 40 mg methylprednisolone from medial midpatellar approach.   PMFS History: Patient Active Problem List   Diagnosis Date Noted  . Acute on chronic diastolic CHF (congestive heart failure) ([place]) 08/02/2019  . Chronic respiratory failure with hypoxia ([place]) 08/02/2019  . Acute on chronic respiratory failure with hypoxemia ([place]) 08/02/2019  .  Atherosclerosis of aorta ([place]) 01/23/2019  . Interstitial lung disease ([place]) 12/24/2018  . Obesity, Class III, BMI 40-49.9 (morbid obesity) ([place]) 12/24/2018  . Pneumonia due to COVID-19 virus 11/15/2018  . Hypersensitivity pneumonitis ([place]) 12/19/2017  . Thrombocytopenia ([place]) 12/19/2017  . Insulin-requiring or dependent type II diabetes mellitus ([place]) 12/17/2017  . HTN (hypertension) 12/17/2017  . Essential hypertension 11/05/2017  . 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[place]) 11/05/2017   Past Medical History:  Diagnosis Date  . Acute respiratory failure with hypoxia ([place]) 12/2018  . Diabetes mellitus without complication ([place])   .  Hypersensitivity pneumonitis ([place]) 12/19/2017  . Hypertension   . ILD (interstitial lung disease) ([place]) 12/24/2018    Family History  Problem Relation Age of Onset  . Diabetes Other   . Hypertension Other     Past Surgical History:  Procedure Laterality Date  . ABDOMINAL HYSTERECTOMY    . VIDEO BRONCHOSCOPY Bilateral 04/02/2019   Procedure: VIDEO BRONCHOSCOPY WITH FLUORO;  Surgeon: Marshell Garfinkel, MD;  Location: [facility];  Service: Cardiopulmonary;  Laterality: Bilateral;   Social History   Occupational History  . Not on file  Tobacco Use  . Smoking status: Never Smoker  . Smokeless tobacco: Never Used  Substance and Sexual Activity  . Alcohol use: Never  . Drug use: Never  . Sexual activity: Not on file

## 2019-09-11 ENCOUNTER — Ambulatory Visit (INDEPENDENT_AMBULATORY_CARE_PROVIDER_SITE_OTHER): Payer: Medicare Other

## 2019-09-11 ENCOUNTER — Other Ambulatory Visit: Payer: Self-pay

## 2019-09-11 DIAGNOSIS — Z111 Encounter for screening for respiratory tuberculosis: Secondary | ICD-10-CM

## 2019-09-11 LAB — TB SKIN TEST
Induration: 0 mm
TB Skin Test: NEGATIVE

## 2019-10-01 ENCOUNTER — Telehealth (HOSPITAL_COMMUNITY): Payer: Self-pay

## 2019-10-01 NOTE — Telephone Encounter (Signed)
The above patient or their representative was contacted and gave the following answers to these questions:         Do you have any of the following symptoms?    NO  Fever                    Cough                   Shortness of breath  Do  you have any of the following other symptoms?    muscle pain         vomiting,        diarrhea        rash         weakness        red eye        abdominal pain         bruising          bruising or bleeding              joint pain           severe headache    Have you been in contact with someone who was or has been sick in the past 2 weeks?  NO  Yes                 Unsure                         Unable to assess   Does the person that you were in contact with have any of the following symptoms?   Cough         shortness of breath           muscle pain         vomiting,            diarrhea            rash            weakness           fever            red eye           abdominal pain           bruising  or  bleeding                joint pain                severe headache                 COMMENTS OR ACTION PLAN FOR THIS PATIENT:        ALL QUESTIONS WERE ANSWERED/[facility]

## 2019-10-02 ENCOUNTER — Encounter: Payer: Self-pay | Admitting: Vascular Surgery

## 2019-10-02 ENCOUNTER — Other Ambulatory Visit: Payer: Self-pay

## 2019-10-02 ENCOUNTER — Ambulatory Visit (HOSPITAL_COMMUNITY)
Admission: RE | Admit: 2019-10-02 | Discharge: 2019-10-02 | Disposition: A | Discharge status: Home / Self Care | Payer: Medicare Other | Source: Ambulatory Visit | Attending: Vascular Surgery | Admitting: Vascular Surgery

## 2019-10-02 ENCOUNTER — Ambulatory Visit (INDEPENDENT_AMBULATORY_CARE_PROVIDER_SITE_OTHER): Payer: Medicare Other | Admitting: Vascular Surgery

## 2019-10-02 VITALS — BP 131/73 | HR 72 | Temp 97.7°F | Resp 18 | Ht 63.5 in | Wt 267.3 lb

## 2019-10-02 DIAGNOSIS — R600 Localized edema: Secondary | ICD-10-CM | POA: Insufficient documentation

## 2019-10-02 DIAGNOSIS — I872 Venous insufficiency (chronic) (peripheral): Secondary | ICD-10-CM | POA: Diagnosis not present

## 2019-10-02 NOTE — Progress Notes (Signed)
REASON FOR CONSULT:    Bilateral lower extremity edema.  The consult is requested by [name], PA.  ASSESSMENT & PLAN:   CHRONIC VENOUS INSUFFICIENCY: This patient has CEAP C4a venous disease.  She has significant symptoms related to venous hypertension.  We have discussed the importance of intermittent leg elevation and the proper positioning for this.  In addition we are having her fitted for some knee-high compression stockings with a gradient of 15 to 20 mmHg.  If these are not successful then I would recommend thigh-high stockings with a gradient of 20 to 30 mmHg although these may be difficult to fit for her.  I have also encouraged her to avoid prolonged sitting and standing.  We have discussed the importance of exercise specifically walking and water aerobics.  We have also discussed the importance of maintaining a healthy weight as central obesity especially increases lower extremity venous pressure.  If her symptoms progress and thigh-high stockings do not help relieve her symptoms I think she would potentially be a candidate for laser ablation of the left great saphenous vein from the mid thigh to just below the saphenofemoral junction.  Again this would be technically challenging because of her size.  Kathy Mayo, MD Office: [phone]   HPI:   Kathy Irwin is a pleasant 69 y.o. female, who is referred with bilateral lower extremity edema.  She does have a history of diastolic congestive heart failure.  I have reviewed the records from the referring office.  The patient was seen on 07/15/2019 for evaluation of lower extremity edema.  The patient does have a history of congestive heart failure.  Given her significant leg swelling she was referred for vascular consultation in order to evaluate her for chronic venous insufficiency.  On my history the patient describes bilateral lower extremity swelling for approximately 1 year.  This was gradual in onset.  Her swelling is more  significant on the right side.  She describes some aching pain and heaviness in her legs which is aggravated by standing and relieved with elevation.  She also has been wearing some knee-high compression stockings which help her symptoms.  She is had no previous venous procedures and has no previous history of DVT.  She was unaware that she has a history of congestive heart failure.  I do not get any clear-cut history of claudication or rest pain.  In reviewing the records from the referring office he has multiple medical issues including acute and chronic respiratory failure, idiopathic thrombocytopenia, type 2 diabetes, thrombocytopenia, obstructive sleep apnea, essential hypertension, history of diastolic congestive heart failure, history of adenomatous polyp of the colon, chronic myeloid leukemia, morbid obesity, gastroesophageal reflux disease, hypercholesterolemia, and interstitial lung disease.  Past Medical History:  Diagnosis Date  . Acute respiratory failure with hypoxia ([place]) 12/2018  . Diabetes mellitus without complication ([place])   . Hypersensitivity pneumonitis ([place]) 12/19/2017  . Hypertension   . ILD (interstitial lung disease) ([place]) 12/24/2018    Family History  Problem Relation Age of Onset  . Diabetes Other   . Hypertension Other     SOCIAL HISTORY: Social History   Socioeconomic History  . Marital status: Married    Spouse name: Not on file  . Number of children: Not on file  . Years of education: Not on file  . Highest education level: Not on file  Occupational History  . Not on file  Tobacco Use  . Smoking status: Never Smoker  . Smokeless tobacco: Never Used  Vaping Use  . Vaping Use: Never used  Substance and Sexual Activity  . Alcohol use: Never  . Drug use: Never  . Sexual activity: Not on file  Other Topics Concern  . Not on file  Social History Narrative  . Not on file   Social Determinants of Health   Financial Resource Strain:   . Difficulty of  Paying Living Expenses:   Food Insecurity:   . Worried About Charity fundraiser in the Last Year:   . Arboriculturist in the Last Year:   Transportation Needs:   . Film/video editor (Medical):   Marland Kitchen Lack of Transportation (Non-Medical):   Physical Activity:   . Days of Exercise per Week:   . Minutes of Exercise per Session:   Stress:   . Feeling of Stress :   Social Connections:   . Frequency of Communication with Friends and Family:   . Frequency of Social Gatherings with Friends and Family:   . Attends Religious Services:   . Active Member of Clubs or Organizations:   . Attends Archivist Meetings:   Marland Kitchen Marital Status:   Intimate Partner Violence:   . Fear of Current or Ex-Partner:   . Emotionally Abused:   Marland Kitchen Physically Abused:   . Sexually Abused:     Allergies  Allergen Reactions  . Other Shortness Of Breath and Other (See Comments)    Newspaper ink =  new chest pain, also  . Iodine Other (See Comments)    "Was a long time ago" (Reaction??)  . Merbromin Other (See Comments)    Mercurochrome- "Was a long time ago" (Reaction??)  . Tape     Current Outpatient Medications  Medication Sig Dispense Refill  . acetaminophen (TYLENOL) 500 MG tablet Take 1,000 mg by mouth 2 (two) times daily as needed (for pain).    Marland Kitchen albuterol (VENTOLIN HFA) 108 (90 Base) MCG/ACT inhaler Inhale 1-2 puffs into the lungs every 6 (six) hours as needed for wheezing or shortness of breath. 8 g 0  . Azelastine-Fluticasone (DYMISTA) 137-50 MCG/ACT SUSP Place 1-2 sprays into both nostrils daily as needed (for allergies). 23 g 1  . blood glucose meter kit and supplies KIT Dispense based on patient and insurance preference. Use up to four times daily as directed. (FOR ICD-9 250.00, 250.01). For QAC - HS accuchecks. 1 each 1  . [employer] D3 125 MCG (5000 UT) capsule Take 5,000 Units by mouth daily.    Marland Kitchen dextromethorphan 15 MG/5ML syrup Take 10 mLs (30 mg total) by mouth 4 (four) times daily as  needed for cough. 118 mL 0  . diphenhydrAMINE (BENADRYL) 25 MG tablet Take 1 tablet (25 mg total) by mouth at bedtime as needed for itching. 90 tablet 1  . fluticasone (FLONASE) 50 MCG/ACT nasal spray Place 2 sprays into both nostrils daily as needed for allergies or rhinitis.     . furosemide (LASIX) 40 MG tablet Take 40 mg by mouth.    Marland Kitchen glimepiride (AMARYL) 4 MG tablet Take 1 tablet (4 mg total) by mouth daily with breakfast. 90 tablet 1  . glucose blood (FREESTYLE LITE) test strip For glucose testing every before meals at bedtime. Diagnosis E 11.65  Can substitute to any accepted brand 100 each 0  . Insulin Lispro Prot & Lispro (HUMALOG MIX 75/25 KWIKPEN) (75-25) 100 UNIT/ML Kwikpen Inject 80 Units into the skin See admin instructions. Inject 50 units into the skin before breakfast "and a second  dose at bedtime 30 units (Patient taking differently: Inject 30-50 Units into the skin See admin instructions. Inject 50 units into the skin before breakfast and a second dose at bedtime 30 units) 30 mL 3  . levocetirizine (XYZAL) 5 MG tablet Take 1 tablet by mouth daily as needed for allergies.     . metFORMIN (GLUCOPHAGE) 1000 MG tablet Take 1,000 mg by mouth 2 (two) times daily with a meal.    . metolazone (ZAROXOLYN) 2.5 MG tablet Take 2.5 mg by mouth three times a week, take 30 minutes before taking lasix 30 tablet 5  . metoprolol (TOPROL-XL) 200 MG 24 hr tablet Take 1 tablet (200 mg total) by mouth daily. 90 tablet 1  . montelukast (SINGULAIR) 10 MG tablet Take 10 mg by mouth at bedtime.     Marland Kitchen olopatadine (PATANOL) 0.1 % ophthalmic solution Place 1 drop into both eyes 2 (two) times daily. (Patient taking differently: Place 1 drop into both eyes 2 (two) times daily as needed for allergies. ) 5 mL 0  . omeprazole (PRILOSEC) 20 MG capsule Take 1 capsule (20 mg total) by mouth daily. (Patient taking differently: Take 20 mg by mouth daily as needed (GERD). ) 30 capsule 0  . pravastatin (PRAVACHOL) 40 MG  tablet Take 40 mg by mouth at bedtime.    . pregabalin (LYRICA) 50 MG capsule Take 1 capsule (50 mg total) by mouth 3 (three) times daily. 270 capsule 0  . sertraline (ZOLOFT) 100 MG tablet Take 100 mg by mouth at bedtime.    . sodium chloride (OCEAN) 0.65 % SOLN nasal spray Place 1 spray into both nostrils as needed for congestion. 15 mL 0  . HYDROcodone-acetaminophen (NORCO) 5-325 MG tablet Take 1 tablet by mouth every 6 (six) hours as needed for moderate pain. (Patient not taking: Reported on 10/02/2019) 30 tablet 0  . ondansetron (ZOFRAN) 4 MG tablet Take 1 tablet (4 mg total) by mouth every 8 (eight) hours as needed for nausea or vomiting. (Patient not taking: Reported on 10/02/2019) 12 tablet 0   No current facility-administered medications for this visit.    REVIEW OF SYSTEMS:  _0  denotes positive finding, _1  denotes negative finding Cardiac  Comments:  Chest pain or chest pressure:    Shortness of breath upon exertion: x   Short of breath when lying flat:    Irregular heart rhythm:        Vascular    Pain in calf, thigh, or hip brought on by ambulation:    Pain in feet at night that wakes you up from your sleep:     Blood clot in your veins:    Leg swelling:  x       Pulmonary    Oxygen at home: x   Productive cough:     Wheezing:         Neurologic    Sudden weakness in arms or legs:     Sudden numbness in arms or legs:     Sudden onset of difficulty speaking or slurred speech:    Temporary loss of vision in one eye:     Problems with dizziness:         Gastrointestinal    Blood in stool:     Vomited blood:         Genitourinary    Burning when urinating:     Blood in urine:        Psychiatric    Major depression:  Hematologic    Bleeding problems:    Problems with blood clotting too easily:        Skin    Rashes or ulcers: x       Constitutional    Fever or chills:     PHYSICAL EXAM:   Vitals:   10/02/19 1505  BP: 131/73  Pulse: 72   Resp: 18  Temp: 97.7 F (36.5 C)  TempSrc: Temporal  SpO2: (!) 86%  Weight: 267 lb 4.8 oz (121.2 kg)  Height: 5' 3.5" (1.613 m)   Body mass index is 46.61 kg/m.  GENERAL: The patient is a well-nourished female, in no acute distress. The vital signs are documented above. CARDIAC: There is a regular rate and rhythm.  VASCULAR: I do not detect carotid bruits. She has swelling in her feet so I could not palpate pedal pulses.  However she had biphasic Doppler signals in the dorsalis pedis and posterior tibial positions. She has hyperpigmentation bilaterally consistent with chronic venous insufficiency.  She has bilateral lower extremity swelling.  She had a small blister on her right anterior lateral leg which is dry and healing.     I did look at her right great saphenous vein myself with the SonoSite.  The vein is dilated with reflux throughout the thigh.  We would likely cannulate this in the mid to distal thigh.  This would be technically challenging given the depth of the vein.  PULMONARY: There is good air exchange bilaterally without wheezing or rales. ABDOMEN: Soft and non-tender with normal pitched bowel sounds.  MUSCULOSKELETAL: There are no major deformities or cyanosis. NEUROLOGIC: No focal weakness or paresthesias are detected. SKIN: There are no ulcers or rashes noted. PSYCHIATRIC: The patient has a normal affect.  DATA:    VENOUS DUPLEX: I have independently interpreted her venous duplex scan today.  On the right side there is no evidence of DVT.  There is deep venous reflux involving the common femoral vein.  There is superficial venous reflux in the great saphenous vein from the saphenofemoral junction to the knee.  The vein has diameters ranging from a 0.4-0.57 cm.  On the left side there is no evidence of DVT.  There is deep venous reflux involving the common femoral vein, femoral vein, and popliteal vein.  There is no significant superficial venous reflux on the  left.

## 2019-10-06 ENCOUNTER — Ambulatory Visit: Payer: Self-pay

## 2019-10-06 ENCOUNTER — Encounter: Payer: Self-pay | Admitting: Physical Medicine and Rehabilitation

## 2019-10-06 ENCOUNTER — Other Ambulatory Visit: Payer: Self-pay

## 2019-10-06 ENCOUNTER — Ambulatory Visit: Payer: Medicare Other | Admitting: Physical Medicine and Rehabilitation

## 2019-10-06 VITALS — BP 138/70 | HR 83

## 2019-10-06 DIAGNOSIS — M5416 Radiculopathy, lumbar region: Secondary | ICD-10-CM | POA: Diagnosis not present

## 2019-10-06 MED ORDER — METHYLPREDNISOLONE ACETATE 80 MG/ML IJ SUSP
80.0000 mg | Freq: Once | INTRAMUSCULAR | Status: AC
  Administered 2019-10-06: 80 mg

## 2019-10-06 NOTE — Progress Notes (Signed)
Pt states pain on the right side of the lower back that radiates into the front of the right leg all the way down. Pain started 3-4 months ago. Standing and squatting helps with pain. Tylenol helps with pain.   .Numeric Pain Rating Scale and Functional Assessment Average Pain 6   In the last MONTH (on 0-10 scale) has pain interfered with the following?  1. General activity like being  able to carry out your everyday physical activities such as walking, climbing stairs, carrying groceries, or moving a chair?  Rating(7)   +Driver, -BT, +Dye Allergies (iodine), iodine and mercury from mild symptoms from a long time ago.  No recent allergy to contrast dye.

## 2019-10-07 NOTE — Progress Notes (Signed)
Kathy Irwin - 69 y.o. female MRN [id number]  Date of birth: 1950-05-12  Office Visit Note: Visit Date: 10/06/2019 PCP: Kerin Perna, NP Referred by: Kerin Perna, NP  Subjective: Chief Complaint  Patient presents with  . Lower Back - Pain  . Right Leg - Pain   HPI: Kathy Irwin is a 69 y.o. female who comes in today at the request of Dr. Eunice Blase for planned Right L5-S1 Lumbar epidural steroid injection with fluoroscopic guidance.  The patient has failed conservative care including home exercise, medications, time and activity modification.  This injection will be diagnostic and hopefully therapeutic.  Please see requesting physician notes for further details and justification.   Patient with several comorbidities of obesity and wound disease and congestive heart failure and diabetes.  She does have right classic L5 distribution pain.  X-rays do reveal spondylosis at the bottom levels.  No listhesis no red flag complaints otherwise.  Depending on relief with diagnostic L5 transforaminal consider MRI or advanced imaging of the lumbar spine.   ROS Otherwise per HPI.  Assessment & Plan: Visit Diagnoses:  1. Lumbar radiculopathy     Plan: No additional findings.   Meds & Orders:  Meds ordered this encounter  Medications  . methylPREDNISolone acetate (DEPO-MEDROL) injection 80 mg    Orders Placed This Encounter  Procedures  . XR C-ARM NO REPORT  . Epidural Steroid injection    Follow-up: Return for visit to requesting physician as needed.   Procedures: No procedures performed  Lumbosacral Transforaminal Epidural Steroid Injection - Sub-Pedicular Approach with Fluoroscopic Guidance  Patient: Kathy Irwin      Date of Birth: 1950-12-05 MRN: [id number] PCP: Kerin Perna, NP      Visit Date: 10/06/2019   Universal Protocol:    Date/Time: 10/06/2019  Consent Given By: the patient  Position: PRONE  Additional Comments: Vital signs were  monitored before and after the procedure. Patient was prepped and draped in the usual sterile fashion. The correct patient, procedure, and site was verified.   Injection Procedure Details:  Procedure Site One Meds Administered:  Meds ordered this encounter  Medications  . methylPREDNISolone acetate (DEPO-MEDROL) injection 80 mg    Laterality: Right  Location/Site:  L5-S1  Needle size: 22 G  Needle type: Spinal  Needle Placement: Transforaminal  Findings:    -Comments: Excellent flow of contrast along the nerve, nerve root and into the epidural space.  Procedure Details: After squaring off the end-plates to get a true AP view, the C-arm was positioned so that an oblique view of the foramen as noted above was visualized. The target area is just inferior to the "nose of the scotty dog" or sub pedicular. The soft tissues overlying this structure were infiltrated with 2-3 ml. of 1% Lidocaine without Epinephrine.  The spinal needle was inserted toward the target using a "trajectory" view along the fluoroscope beam.  Under AP and lateral visualization, the needle was advanced so it did not puncture dura and was located close the 6 O'Clock position of the pedical in AP tracterory. Biplanar projections were used to confirm position. Aspiration was confirmed to be negative for CSF and/or blood. A 1-2 ml. volume of Isovue-250 was injected and flow of contrast was noted at each level. Radiographs were obtained for documentation purposes.   After attaining the desired flow of contrast documented above, a 0.5 to 1.0 ml test dose of 0.25% Marcaine was injected into each respective transforaminal space.  The patient was  observed for 90 seconds post injection.  After no sensory deficits were reported, and normal lower extremity motor function was noted,   the above injectate was administered so that equal amounts of the injectate were placed at each foramen (level) into the transforaminal epidural  space.   Additional Comments:  The patient tolerated the procedure well Dressing: 2 x 2 sterile gauze and Band-Aid    Post-procedure details: Patient was observed during the procedure. Post-procedure instructions were reviewed.  Patient left the clinic in stable condition.      Clinical History: No specialty comments available.   She reports that she has never smoked. She has never used smokeless tobacco.  Recent Labs    12/24/18 0340 04/04/19 1219 08/02/19 1057  HGBA1C 8.1* 8.4* 8.0*    Objective:  VS:  HT:    WT:   BMI:     BP:138/70  HR:83bpm  TEMP: ( )  RESP:  Physical Exam Constitutional:      General: She is not in acute distress.    Appearance: Normal appearance. She is obese. She is not ill-appearing.  HENT:     Head: Normocephalic and atraumatic.     Right Ear: External ear normal.     Left Ear: External ear normal.  Eyes:     Extraocular Movements: Extraocular movements intact.  Cardiovascular:     Rate and Rhythm: Normal rate.     Pulses: Normal pulses.  Musculoskeletal:     Right lower leg: No edema.     Left lower leg: No edema.     Comments: Patient has good distal strength with no pain over the greater trochanters.  No clonus or focal weakness.  She is in wheel chair today and is having difficulty standing and ambulating secondary to pain and breathing.  Skin:    Findings: No erythema, lesion or rash.  Neurological:     General: No focal deficit present.     Mental Status: She is alert and oriented to person, place, and time.     Sensory: No sensory deficit.     Motor: No weakness or abnormal muscle tone.     Coordination: Coordination normal.  Psychiatric:        Mood and Affect: Mood normal.        Behavior: Behavior normal.     Ortho Exam  Imaging: XR C-ARM NO REPORT  Result Date: 10/06/2019 Please see Notes tab for imaging impression.   Past Medical/Family/Surgical/Social History: Medications & Allergies reviewed per EMR,  new medications updated. Patient Active Problem List   Diagnosis Date Noted  . Acute on chronic diastolic CHF (congestive heart failure) ([place]) 08/02/2019  . Chronic respiratory failure with hypoxia ([place]) 08/02/2019  . Acute on chronic respiratory failure with hypoxemia ([facility] [employer] [facility]) 08/02/2019  . Atherosclerosis of aorta ([place]) 01/23/2019  . Interstitial lung disease ([place]) 12/24/2018  . Obesity, Class III, BMI 40-49.9 (morbid obesity) ([place]) 12/24/2018  . Pneumonia due to COVID-19 virus 11/15/2018  . Hypersensitivity pneumonitis ([place]) 12/19/2017  . Thrombocytopenia ([place]) 12/19/2017  . Insulin-requiring or dependent type II diabetes mellitus ([place]) 12/17/2017  . HTN (hypertension) 12/17/2017  . Essential hypertension 11/05/2017  . 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 Acute respiratory failure with hypoxia ([place]) 12/2018  . Diabetes mellitus without complication ([place])   . 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 Hypertension   . ILD (interstitial lung disease) ([place]) 12/24/2018   Family History  Problem Relation Age of Onset  .  Diabetes Other   . Hypertension Other    Past Surgical History:  Procedure Laterality Date  . ABDOMINAL HYSTERECTOMY    . VIDEO BRONCHOSCOPY Bilateral 04/02/2019   Procedure: VIDEO BRONCHOSCOPY WITH FLUORO;  Surgeon: Marshell Garfinkel, MD;  Location: [facility];  Service: Cardiopulmonary;  Laterality: Bilateral;   Social History   Occupational History  . Not on file  Tobacco Use  . Smoking status: Never Smoker  . Smokeless tobacco: Never Used  Vaping Use  . Vaping Use: Never used  Substance and Sexual Activity  . Alcohol use: Never  . Drug use: Never  . Sexual activity: Not on file

## 2019-10-07 NOTE — Procedures (Signed)
Lumbosacral Transforaminal Epidural Steroid Injection - Sub-Pedicular Approach with Fluoroscopic Guidance  Patient: Kathy Irwin      Date of Birth: [birth year] MRN: [id number] PCP: Kerin Perna, NP      Visit Date: 10/06/2019   Universal Protocol:    Date/Time: 10/06/2019  Consent Given By: the patient  Position: PRONE  Additional Comments: Vital signs were monitored before and after the procedure. Patient was prepped and draped in the usual sterile fashion. The correct patient, procedure, and site was verified.   Injection Procedure Details:  Procedure Site One Meds Administered:  Meds ordered this encounter  Medications  . methylPREDNISolone acetate (DEPO-MEDROL) injection 80 mg    Laterality: Right  Location/Site:  L5-S1  Needle size: 22 G  Needle type: Spinal  Needle Placement: Transforaminal  Findings:    -Comments: Excellent flow of contrast along the nerve, nerve root and into the epidural space.  Procedure Details: After squaring off the end-plates to get a true AP view, the C-arm was positioned so that an oblique view of the foramen as noted above was visualized. The target area is just inferior to the "nose of the scotty dog" or sub pedicular. The soft tissues overlying this structure were infiltrated with 2-3 ml. of 1% Lidocaine without Epinephrine.  The spinal needle was inserted toward the target using a "trajectory" view along the fluoroscope beam.  Under AP and lateral visualization, the needle was advanced so it did not puncture dura and was located close the 6 O'Clock position of the pedical in AP tracterory. Biplanar projections were used to confirm position. Aspiration was confirmed to be negative for CSF and/or blood. A 1-2 ml. volume of Isovue-250 was injected and flow of contrast was noted at each level. Radiographs were obtained for documentation purposes.   After attaining the desired flow of contrast documented above, a 0.5 to 1.0 ml test  dose of 0.25% Marcaine was injected into each respective transforaminal space.  The patient was observed for 90 seconds post injection.  After no sensory deficits were reported, and normal lower extremity motor function was noted,   the above injectate was administered so that equal amounts of the injectate were placed at each foramen (level) into the transforaminal epidural space.   Additional Comments:  The patient tolerated the procedure well Dressing: 2 x 2 sterile gauze and Band-Aid    Post-procedure details: Patient was observed during the procedure. Post-procedure instructions were reviewed.  Patient left the clinic in stable condition.

## 2019-10-24 ENCOUNTER — Ambulatory Visit (HOSPITAL_COMMUNITY)
Admission: EM | Admit: 2019-10-24 | Discharge: 2019-10-24 | Disposition: A | Discharge status: Home / Self Care | Payer: Medicare Other | Attending: Family Medicine | Admitting: Family Medicine

## 2019-10-24 ENCOUNTER — Encounter (HOSPITAL_COMMUNITY): Payer: Self-pay

## 2019-10-24 ENCOUNTER — Other Ambulatory Visit: Payer: Self-pay

## 2019-10-24 DIAGNOSIS — T148XXA Other injury of unspecified body region, initial encounter: Secondary | ICD-10-CM | POA: Insufficient documentation

## 2019-10-24 DIAGNOSIS — R0602 Shortness of breath: Secondary | ICD-10-CM | POA: Diagnosis present

## 2019-10-24 DIAGNOSIS — R609 Edema, unspecified: Secondary | ICD-10-CM | POA: Insufficient documentation

## 2019-10-24 DIAGNOSIS — M79661 Pain in right lower leg: Secondary | ICD-10-CM | POA: Diagnosis not present

## 2019-10-24 DIAGNOSIS — L03115 Cellulitis of right lower limb: Secondary | ICD-10-CM | POA: Insufficient documentation

## 2019-10-24 LAB — COMPREHENSIVE METABOLIC PANEL
ALT: 14 U/L (ref 0–44)
AST: 18 U/L (ref 15–41)
Albumin: 4.3 g/dL (ref 3.5–5.0)
Alkaline Phosphatase: 50 U/L (ref 38–126)
Anion gap: 8 (ref 5–15)
BUN: 8 mg/dL (ref 8–23)
CO2: 26 mmol/L (ref 22–32)
Calcium: 9.9 mg/dL (ref 8.9–10.3)
Chloride: 108 mmol/L (ref 98–111)
Creatinine, Ser: 0.59 mg/dL (ref 0.44–1.00)
GFR calc Af Amer: >60 mL/min (ref >60)
GFR calc non Af Amer: >60 mL/min (ref >60)
Glucose, Bld: 105 mg/dL — ABNORMAL HIGH (ref 70–99)
Potassium: 4.2 mmol/L (ref 3.5–5.1)
Sodium: 142 mmol/L (ref 135–145)
Total Bilirubin: 1.7 mg/dL — ABNORMAL HIGH (ref 0.3–1.2)
Total Protein: 7.2 g/dL (ref 6.5–8.1)

## 2019-10-24 LAB — BRAIN NATRIURETIC PEPTIDE: B Natriuretic Peptide: 38 pg/mL (ref 0.0–100.0)

## 2019-10-24 MED ORDER — CEPHALEXIN 500 MG PO CAPS: 500.0000 mg | ORAL_CAPSULE | Freq: Three times a day (TID) | ORAL | 0 refills | Status: AC

## 2019-10-24 NOTE — Discharge Instructions (Addendum)
I am concerned about your fluid retention as source of your blister as well as your increased shortness of breath .  Try to keep blister intact as able, if it opens cleanse daily with soap and water, keep covered to keep clean.  Please increase your lasix to 80m TWICE A DAY for the next three days.  Elevate your legs as able and as tolerated.  If worsening of shortness of breath please go to the ER.  Please follow up with your PCP next week for recheck.

## 2019-10-24 NOTE — ED Triage Notes (Signed)
Pt states her lower right leg had 3 little bumps 3 days ago, but today she has a big blister on her lower leg. Pt c/o 5/10 pain in right lower leg. Pt states she using her albuterol inhaler more often this week. Pt states she wants to see if she has bronchitis or if she has fluid in the lungs.

## 2019-10-25 NOTE — ED Provider Notes (Signed)
[facility]    CSN: [id number] Arrival date & time: 10/24/19  1806      History   Chief Complaint Chief Complaint  Patient presents with  . Leg Pain    HPI Kathy Irwin is a 69 y.o. female.   Kathy Irwin presents with complaints of  Blister to right lower leg, along with swelling. She noted it approximately 1 week ago and has increased in size. Edema to bilateral lower legs, she initially shares this is increased swelling, but later states this is normal swelling for her. She also has noted some increased shortness of breath over the past week. No fevers. No new congestion. She uses an inhaler prn. Kathy Irwin has an extensive pulmonary history, and last saw her pulmonologist in May of this year. Interstitial lung disease as well as CHF. She had been tapered off of steroids and is not currently on any steroids. Not currently on any oxygen. Has had multiple hospitalizations related to respiratory failure. She does take lasix daily. Has not increased this dose. No known ill contacts. No fevers. No new cough.     ROS per HPI, negative if not otherwise mentioned.      Past Medical History:  Diagnosis Date  . Acute respiratory failure with hypoxia ([place]) 12/2018  . Diabetes mellitus without complication ([place])   . 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 Hypertension   . ILD (interstitial lung disease) ([place]) 12/24/2018    Patient Active Problem List   Diagnosis Date Noted  . Acute on chronic diastolic CHF (congestive heart failure) ([place]) 08/02/2019  . Chronic respiratory failure with hypoxia ([place]) 08/02/2019  . Acute on chronic respiratory failure with hypoxemia ([place]) 08/02/2019  . Atherosclerosis of aorta ([place]) 01/23/2019  . Interstitial lung disease ([place]) 12/24/2018  . Obesity, Class III, BMI 40-49.9 (morbid obesity) ([place]) 12/24/2018  . Pneumonia due to COVID-19 virus 11/15/2018  . 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 Thrombocytopenia ([place])  12/19/2017  . Insulin-requiring or dependent type II diabetes mellitus ([place]) 12/17/2017  . HTN (hypertension) 12/17/2017  . Essential hypertension 11/05/2017  . 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 ABDOMINAL HYSTERECTOMY    . VIDEO BRONCHOSCOPY Bilateral 04/02/2019   Procedure: VIDEO BRONCHOSCOPY WITH FLUORO;  Surgeon: Marshell Garfinkel, MD;  Location: [facility];  Service: Cardiopulmonary;  Laterality: Bilateral;    OB History   No obstetric history on file.      Home Medications    Prior to Admission medications   Medication Sig Start Date End Date Taking? Authorizing Provider  acetaminophen (TYLENOL) 500 MG tablet Take 1,000 mg by mouth 2 (two) times daily as needed (for pain).    [provider]  albuterol (VENTOLIN HFA) 108 (90 Base) MCG/ACT inhaler Inhale 1-2 puffs into the lungs every 6 (six) hours as needed for wheezing or shortness of breath. 07/24/19   Wieters, Hallie C, PA-C  Azelastine-Fluticasone (DYMISTA) 137-50 MCG/ACT SUSP Place 1-2 sprays into both nostrils daily as needed (for allergies). 05/15/19   Kerin Perna, NP  blood glucose meter kit and supplies KIT Dispense based on patient and insurance preference. Use up to four times daily as directed. (FOR ICD-9 250.00, 250.01). For QAC - HS accuchecks. 11/22/18   Thurnell Lose, MD  cephALEXin (KEFLEX) 500 MG capsule Take 1 capsule (500 mg total) by mouth 3 (three) times daily for 10 days. 10/24/19 11/03/19  Zigmund Gottron, NP  [employer] D3 125 MCG (5000  UT) capsule Take 5,000 Units by mouth daily. 07/16/19   [provider]  dextromethorphan 15 MG/5ML syrup Take 10 mLs (30 mg total) by mouth 4 (four) times daily as needed for cough. 08/12/19   Dessa Phi, DO  diphenhydrAMINE (BENADRYL) 25 MG tablet Take 1 tablet (25 mg total) by mouth at bedtime as needed for itching. 09/02/19   Kerin Perna, NP  fluticasone (FLONASE) 50 MCG/ACT nasal  spray Place 2 sprays into both nostrils daily as needed for allergies or rhinitis.  07/15/19   [provider]  furosemide (LASIX) 40 MG tablet Take 40 mg by mouth.    [provider]  glimepiride (AMARYL) 4 MG tablet Take 1 tablet (4 mg total) by mouth daily with breakfast. 09/02/19   Kerin Perna, NP  glucose blood (FREESTYLE LITE) test strip For glucose testing every before meals at bedtime. Diagnosis E 11.65  Can substitute to any accepted brand 11/22/18   Thurnell Lose, MD  HYDROcodone-acetaminophen (NORCO) 5-325 MG tablet Take 1 tablet by mouth every 6 (six) hours as needed for moderate pain. 09/05/19   Brunetta Genera, MD  Insulin Lispro Prot & Lispro (HUMALOG MIX 75/25 KWIKPEN) (75-25) 100 UNIT/ML Kwikpen Inject 80 Units into the skin See admin instructions. Inject 50 units into the skin before breakfast "and a second dose at bedtime 30 units Patient taking differently: Inject 30-50 Units into the skin See admin instructions. Inject 50 units into the skin before breakfast and a second dose at bedtime 30 units 05/15/19   Kerin Perna, NP  levocetirizine (XYZAL) 5 MG tablet Take 1 tablet by mouth daily as needed for allergies.  07/15/19   [provider]  metFORMIN (GLUCOPHAGE) 1000 MG tablet Take 1,000 mg by mouth 2 (two) times daily with a meal.    [provider]  metolazone (ZAROXOLYN) 2.5 MG tablet Take 2.5 mg by mouth three times a week, take 30 minutes before taking lasix 05/06/19   Troy Sine, MD  metoprolol (TOPROL-XL) 200 MG 24 hr tablet Take 1 tablet (200 mg total) by mouth daily. 09/02/19   Kerin Perna, NP  montelukast (SINGULAIR) 10 MG tablet Take 10 mg by mouth at bedtime.  05/31/19   [provider]  olopatadine (PATANOL) 0.1 % ophthalmic solution Place 1 drop into both eyes 2 (two) times daily. Patient taking differently: Place 1 drop into both eyes 2 (two) times daily as needed for allergies.  07/24/19    Wieters, Hallie C, PA-C  omeprazole (PRILOSEC) 20 MG capsule Take 1 capsule (20 mg total) by mouth daily. Patient taking differently: Take 20 mg by mouth daily as needed (GERD).  12/26/18   Hongalgi, Lenis Dickinson, MD  ondansetron (ZOFRAN) 4 MG tablet Take 1 tablet (4 mg total) by mouth every 8 (eight) hours as needed for nausea or vomiting. 08/15/19   Rancour, Annie Main, MD  pravastatin (PRAVACHOL) 40 MG tablet Take 40 mg by mouth at bedtime.    [provider]  pregabalin (LYRICA) 50 MG capsule Take 1 capsule (50 mg total) by mouth 3 (three) times daily. 09/02/19   Kerin Perna, NP  sertraline (ZOLOFT) 100 MG tablet Take 100 mg by mouth at bedtime.    [provider]  sodium chloride (OCEAN) 0.65 % SOLN nasal spray Place 1 spray into both nostrils as needed for congestion. 08/12/19   Dessa Phi, DO    Family History Family History  Problem Relation Age of Onset  .  Diabetes Other   . Hypertension Other     Social History Social History   Tobacco Use  . Smoking status: Never Smoker  . Smokeless tobacco: Never Used  Vaping Use  . Vaping Use: Never used  Substance Use Topics  . Alcohol use: Never  . Drug use: Never     Allergies   Other, Iodine, Merbromin, and Tape   Review of Systems Review of Systems   Physical Exam Triage Vital Signs ED Triage Vitals  Enc Vitals Group     BP 10/24/19 1857 138/87     Pulse Rate 10/24/19 1857 88     Resp 10/24/19 1857 20     Temp 10/24/19 1857 98.5 F (36.9 C)     Temp Source 10/24/19 1857 Oral     SpO2 10/24/19 1857 97 %     Weight 10/24/19 1858 260 lb (117.9 kg)     Height 10/24/19 1858 _0  (1.6 m)     Head Circumference --      Peak Flow --      Pain Score 10/24/19 1858 5     Pain Loc --      Pain Edu? --      Excl. in [place]? --    No data found.  Updated Vital Signs BP 138/87   Pulse 88   Temp 98.5 F (36.9 C) (Oral)   Resp 20   Ht _1  (1.6 m)   Wt 260 lb (117.9 kg)   SpO2 97%   BMI 46.06  kg/m    Physical Exam Cardiovascular:     Rate and Rhythm: Normal rate.     Comments: Right shin with blisters present, redness warmth and pitting edema to lower leg; left leg with less severe pitting edema  Pulmonary:     Comments: Crackles to bases noted on exam; speaking clearly without difficulty in complete sentences; no increased work of breathing Musculoskeletal:     Right lower leg: 2+ Pitting Edema present.     Left lower leg: 1+ Pitting Edema present.        UC Treatments / Results  Labs (all labs ordered are listed, but only abnormal results are displayed) Labs Reviewed  COMPREHENSIVE METABOLIC PANEL - Abnormal; Notable for the following components:      Result Value   Glucose, Bld 105 (*)    Total Bilirubin 1.7 (*)    All other components within normal limits  BRAIN NATRIURETIC PEPTIDE    EKG   Radiology No results found.  Procedures Procedures (including critical care time)  Medications Ordered in UC Medications - No data to display  Initial Impression / Assessment and Plan / UC Course  I have reviewed the triage vital signs and the nursing notes.  Pertinent labs & imaging results that were available during my care of the patient were reviewed by me and considered in my medical decision making (see chart for details).     Extensive pulmonary history, reviewed most recent pulmonology note. Recently weaned off of steroids. Also chf and on lasix, with chronic peripheral edema related to venous insufficiency as well. o2 stable, heart rate stable. Some crackles to lung sounds and blistering to leg which appears related to her swelling. Opted to increase lasix today, bending results of bnp and cmp. Discussed with patient that she may need steroids if symptoms do not improve. Concern for cellulitis surrounding blistering so antibiotics initiated at this time. Encouraged close follow up for recheck as may need further  medication adjustments if symptoms persist.  Strict er precautions provided. Patient verbalized understanding and agreeable to plan.   Final Clinical Impressions(s) / UC Diagnoses   Final diagnoses:  Blister  Cellulitis of right lower extremity  Peripheral edema  Shortness of breath     Discharge Instructions     I am concerned about your fluid retention as source of your blister as well as your increased shortness of breath .  Try to keep blister intact as able, if it opens cleanse daily with soap and water, keep covered to keep clean.  Please increase your lasix to 34m TWICE A DAY for the next three days.  Elevate your legs as able and as tolerated.  If worsening of shortness of breath please go to the ER.  Please follow up with your PCP next week for recheck.     ED Prescriptions    Medication Sig Dispense Auth. Provider   cephALEXin (KEFLEX) 500 MG capsule Take 1 capsule (500 mg total) by mouth 3 (three) times daily for 10 days. 30 capsule BZigmund Gottron NP     PDMP not reviewed this encounter.   BZigmund Gottron NP 10/25/19 1455

## 2019-10-28 ENCOUNTER — Telehealth (INDEPENDENT_AMBULATORY_CARE_PROVIDER_SITE_OTHER): Payer: Self-pay

## 2019-10-28 NOTE — Telephone Encounter (Signed)
Per patient request I have faxed Tb results and last office visit appointment notes to patient employer.

## 2019-10-28 NOTE — Telephone Encounter (Signed)
Patient was informed with granddaughter on call that employer received forms requested.

## 2019-10-28 NOTE — Telephone Encounter (Signed)
Patient calling to check status. She states her employer never received results and notes.     Employer fax:  [phone]

## 2019-10-31 ENCOUNTER — Ambulatory Visit: Payer: Self-pay | Admitting: Surgery

## 2019-11-04 ENCOUNTER — Ambulatory Visit (INDEPENDENT_AMBULATORY_CARE_PROVIDER_SITE_OTHER): Payer: Medicare Other | Admitting: Primary Care

## 2019-11-04 ENCOUNTER — Other Ambulatory Visit: Payer: Self-pay

## 2019-11-04 ENCOUNTER — Encounter (INDEPENDENT_AMBULATORY_CARE_PROVIDER_SITE_OTHER): Payer: Self-pay | Admitting: Primary Care

## 2019-11-04 VITALS — BP 131/79 | HR 72 | Temp 98.1°F | Wt 262.2 lb

## 2019-11-04 DIAGNOSIS — E11621 Type 2 diabetes mellitus with foot ulcer: Secondary | ICD-10-CM | POA: Diagnosis not present

## 2019-11-04 DIAGNOSIS — L97511 Non-pressure chronic ulcer of other part of right foot limited to breakdown of skin: Secondary | ICD-10-CM | POA: Diagnosis not present

## 2019-11-04 DIAGNOSIS — E119 Type 2 diabetes mellitus without complications: Secondary | ICD-10-CM | POA: Diagnosis not present

## 2019-11-04 LAB — POCT GLYCOSYLATED HEMOGLOBIN (HGB A1C): Hemoglobin A1C: 7.1 % — AB (ref 4.0–5.6)

## 2019-11-04 LAB — GLUCOSE, POCT (MANUAL RESULT ENTRY): POC Glucose: 136 mg/dl — AB (ref 70–99)

## 2019-11-04 NOTE — Patient Instructions (Signed)
Hypoxia Hypoxia is a condition that happens when there is a lack of oxygen in the body's tissues and organs. When there is not enough oxygen, organs cannot work as they should. This causes serious problems throughout the body and in the brain. What are the causes? This condition may be caused by:  Exposure to high altitude.  A collapsed lung (pneumothorax).  Lung infection (pneumonia).  Lung injury.  Long-term (chronic) lung disease, such as COPD (chronic obstructive pulmonary disease).  Blood collecting in the chest cavity (hemothorax).  Food, saliva, or vomit getting into the airway (aspiration).  Reduced blood flow (ischemia).  Severe blood loss.  Slow or shallow breathing (hypoventilation).  Blood disorders, such as anemia.  Carbon monoxide poisoning.  The heart suddenly stopping (cardiac arrest).  Anesthetic medicines.  Drowning.  Choking. What are the signs or symptoms? Symptoms of this condition include:  Headache.  Fatigue.  Drowsiness.  Forgetfulness.  Nausea.  Confusion.  Shortness of breath.  Dizziness.  Bluish color of the skin, lips, or nail beds (cyanosis).  Change in consciousness or awareness. If hypoxia is not treated, it can lead to convulsions, loss of consciousness (coma), or brain damage. How is this diagnosed? This condition may be diagnosed based on:  A physical exam.  Blood tests.  A test that measures how much oxygen is in your blood (pulse oximetry). This is done with a sensor that is placed on your finger, toe, or earlobe.  Chest X-ray.  Tests to check your lung function (pulmonary function tests).  A test to check the electrical activity of your heart (electrocardiogram, ECG). You may have other tests to determine the cause of your hypoxia. How is this treated?  Treatment for this condition depends on what is causing the hypoxia. You will likely be treated with oxygen therapy. This may be done by giving you oxygen  through a face mask or through tubes in your nose. Your health care provider may also recommend other therapies to treat the underlying cause of your hypoxia. Follow these instructions at home:  Take over-the-counter and prescription medicines only as told by your health care provider.  Do not use any products that contain nicotine or tobacco, such as cigarettes and e-cigarettes. If you need help quitting, ask your health care provider.  Avoid secondhand smoke.  Work with your health care provider to manage any chronic conditions you have that may be causing hypoxia, such as COPD.  Keep all follow-up visits as told by your health care provider. This is important. Contact a health care provider if:  You have a fever.  You have trouble breathing, even after treatment.  You become extremely short of breath when you exercise. Get help right away if:  Your shortness of breath gets worse, especially with normal or very little activity.  Your skin, lips, or nail beds have a bluish color.  You become confused or you cannot think properly.  You have chest pain. Summary  Hypoxia is a condition that happens when there is a lack of oxygen in the body's tissues and organs.  If hypoxia is not treated, it can lead to convulsions, loss of consciousness (coma), or brain damage.  Symptoms of hypoxia can include a headache, shortness of breath, confusion, nausea, and a bluish skin color.  Hypoxia has many possible causes, including exposure to high altitude, carbon monoxide poisoning, or other health issues, such as blood disorders or cardiac arrest.  Hypoxia is usually treated with oxygen therapy. This information is not   intended to replace advice given to you by your health care provider. Make sure you discuss any questions you have with your health care provider. Document Revised: 03/16/2017 Document Reviewed: 05/22/2016 Elsevier Patient Education  2020 [employer].

## 2019-11-06 ENCOUNTER — Telehealth: Payer: Self-pay | Admitting: Primary Care

## 2019-11-06 NOTE — Telephone Encounter (Signed)
biCopied from [facility] [id number]. Topic: General - Other >> Nov 06, 2019  1:56 PM Rainey Pines A wrote: Patient stated that she was recently seen and [facility] advised her she would be sending in antibiotics to pharmacy. Patient stated that the pharmacy has no record of this and she needs antibiotics and medication for yeast infection sent to pharmacy.  Patient would like a callback once medication has been sent to pharmacy.Please advise

## 2019-11-07 ENCOUNTER — Other Ambulatory Visit (INDEPENDENT_AMBULATORY_CARE_PROVIDER_SITE_OTHER): Payer: Self-pay | Admitting: Primary Care

## 2019-11-07 DIAGNOSIS — S91001D Unspecified open wound, right ankle, subsequent encounter: Secondary | ICD-10-CM

## 2019-11-07 DIAGNOSIS — S81001D Unspecified open wound, right knee, subsequent encounter: Secondary | ICD-10-CM

## 2019-11-07 DIAGNOSIS — S81801D Unspecified open wound, right lower leg, subsequent encounter: Secondary | ICD-10-CM

## 2019-11-07 MED ORDER — DOXYCYCLINE HYCLATE 100 MG PO TABS: 100.0000 mg | ORAL_TABLET | Freq: Two times a day (BID) | ORAL | 0 refills | Status: DC

## 2019-11-08 ENCOUNTER — Encounter (HOSPITAL_COMMUNITY): Payer: Self-pay

## 2019-11-08 ENCOUNTER — Encounter (INDEPENDENT_AMBULATORY_CARE_PROVIDER_SITE_OTHER): Payer: Self-pay | Admitting: Primary Care

## 2019-11-08 ENCOUNTER — Inpatient Hospital Stay (HOSPITAL_COMMUNITY)
Admission: EM | Admit: 2019-11-08 | Discharge: 2019-12-01 | DRG: 870 | Disposition: A | Discharge status: Skilled Nursing Facility | Payer: Medicare Other | Attending: Internal Medicine | Admitting: Internal Medicine

## 2019-11-08 ENCOUNTER — Ambulatory Visit (HOSPITAL_COMMUNITY)
Admission: EM | Admit: 2019-11-08 | Discharge: 2019-11-08 | Disposition: A | Discharge status: Home / Self Care | Payer: Medicare Other | Attending: Family Medicine | Admitting: Family Medicine

## 2019-11-08 DIAGNOSIS — L03115 Cellulitis of right lower limb: Secondary | ICD-10-CM | POA: Diagnosis present

## 2019-11-08 DIAGNOSIS — R5383 Other fatigue: Secondary | ICD-10-CM

## 2019-11-08 DIAGNOSIS — K8010 Calculus of gallbladder with chronic cholecystitis without obstruction: Secondary | ICD-10-CM | POA: Diagnosis present

## 2019-11-08 DIAGNOSIS — I872 Venous insufficiency (chronic) (peripheral): Secondary | ICD-10-CM | POA: Diagnosis present

## 2019-11-08 DIAGNOSIS — I10 Essential (primary) hypertension: Secondary | ICD-10-CM | POA: Diagnosis present

## 2019-11-08 DIAGNOSIS — I5033 Acute on chronic diastolic (congestive) heart failure: Secondary | ICD-10-CM | POA: Diagnosis present

## 2019-11-08 DIAGNOSIS — Z90710 Acquired absence of both cervix and uterus: Secondary | ICD-10-CM

## 2019-11-08 DIAGNOSIS — A419 Sepsis, unspecified organism: Secondary | ICD-10-CM | POA: Diagnosis not present

## 2019-11-08 DIAGNOSIS — J849 Interstitial pulmonary disease, unspecified: Secondary | ICD-10-CM | POA: Diagnosis present

## 2019-11-08 DIAGNOSIS — D6959 Other secondary thrombocytopenia: Secondary | ICD-10-CM | POA: Diagnosis present

## 2019-11-08 DIAGNOSIS — Z8611 Personal history of tuberculosis: Secondary | ICD-10-CM

## 2019-11-08 DIAGNOSIS — G9341 Metabolic encephalopathy: Secondary | ICD-10-CM | POA: Diagnosis present

## 2019-11-08 DIAGNOSIS — E118 Type 2 diabetes mellitus with unspecified complications: Secondary | ICD-10-CM

## 2019-11-08 DIAGNOSIS — E662 Morbid (severe) obesity with alveolar hypoventilation: Secondary | ICD-10-CM | POA: Diagnosis present

## 2019-11-08 DIAGNOSIS — N179 Acute kidney failure, unspecified: Secondary | ICD-10-CM

## 2019-11-08 DIAGNOSIS — Z20822 Contact with and (suspected) exposure to covid-19: Secondary | ICD-10-CM | POA: Diagnosis present

## 2019-11-08 DIAGNOSIS — Z9289 Personal history of other medical treatment: Secondary | ICD-10-CM

## 2019-11-08 DIAGNOSIS — Z6841 Body Mass Index (BMI) 40.0 and over, adult: Secondary | ICD-10-CM

## 2019-11-08 DIAGNOSIS — Z978 Presence of other specified devices: Secondary | ICD-10-CM

## 2019-11-08 DIAGNOSIS — Z8249 Family history of ischemic heart disease and other diseases of the circulatory system: Secondary | ICD-10-CM

## 2019-11-08 DIAGNOSIS — E1165 Type 2 diabetes mellitus with hyperglycemia: Secondary | ICD-10-CM | POA: Diagnosis present

## 2019-11-08 DIAGNOSIS — Z883 Allergy status to other anti-infective agents status: Secondary | ICD-10-CM

## 2019-11-08 DIAGNOSIS — R6883 Chills (without fever): Secondary | ICD-10-CM | POA: Diagnosis not present

## 2019-11-08 DIAGNOSIS — Z9109 Other allergy status, other than to drugs and biological substances: Secondary | ICD-10-CM

## 2019-11-08 DIAGNOSIS — K5900 Constipation, unspecified: Secondary | ICD-10-CM | POA: Diagnosis present

## 2019-11-08 DIAGNOSIS — I5032 Chronic diastolic (congestive) heart failure: Secondary | ICD-10-CM | POA: Diagnosis present

## 2019-11-08 DIAGNOSIS — J9600 Acute respiratory failure, unspecified whether with hypoxia or hypercapnia: Secondary | ICD-10-CM

## 2019-11-08 DIAGNOSIS — J9622 Acute and chronic respiratory failure with hypercapnia: Secondary | ICD-10-CM | POA: Diagnosis present

## 2019-11-08 DIAGNOSIS — K819 Cholecystitis, unspecified: Secondary | ICD-10-CM

## 2019-11-08 DIAGNOSIS — M79604 Pain in right leg: Secondary | ICD-10-CM

## 2019-11-08 DIAGNOSIS — T68XXXA Hypothermia, initial encounter: Secondary | ICD-10-CM

## 2019-11-08 DIAGNOSIS — E876 Hypokalemia: Secondary | ICD-10-CM | POA: Diagnosis present

## 2019-11-08 DIAGNOSIS — Z8616 Personal history of COVID-19: Secondary | ICD-10-CM

## 2019-11-08 DIAGNOSIS — D693 Immune thrombocytopenic purpura: Secondary | ICD-10-CM | POA: Diagnosis present

## 2019-11-08 DIAGNOSIS — J9611 Chronic respiratory failure with hypoxia: Secondary | ICD-10-CM | POA: Diagnosis present

## 2019-11-08 DIAGNOSIS — Z794 Long term (current) use of insulin: Secondary | ICD-10-CM

## 2019-11-08 DIAGNOSIS — Z833 Family history of diabetes mellitus: Secondary | ICD-10-CM

## 2019-11-08 DIAGNOSIS — L97919 Non-pressure chronic ulcer of unspecified part of right lower leg with unspecified severity: Secondary | ICD-10-CM

## 2019-11-08 DIAGNOSIS — E119 Type 2 diabetes mellitus without complications: Secondary | ICD-10-CM

## 2019-11-08 DIAGNOSIS — E860 Dehydration: Secondary | ICD-10-CM | POA: Diagnosis present

## 2019-11-08 DIAGNOSIS — E870 Hyperosmolality and hypernatremia: Secondary | ICD-10-CM | POA: Diagnosis present

## 2019-11-08 DIAGNOSIS — Z9911 Dependence on respirator [ventilator] status: Secondary | ICD-10-CM

## 2019-11-08 DIAGNOSIS — R1110 Vomiting, unspecified: Secondary | ICD-10-CM

## 2019-11-08 DIAGNOSIS — R54 Age-related physical debility: Secondary | ICD-10-CM | POA: Diagnosis present

## 2019-11-08 DIAGNOSIS — R6520 Severe sepsis without septic shock: Secondary | ICD-10-CM | POA: Diagnosis present

## 2019-11-08 DIAGNOSIS — E11622 Type 2 diabetes mellitus with other skin ulcer: Secondary | ICD-10-CM

## 2019-11-08 DIAGNOSIS — Z79899 Other long term (current) drug therapy: Secondary | ICD-10-CM

## 2019-11-08 DIAGNOSIS — J9621 Acute and chronic respiratory failure with hypoxia: Secondary | ICD-10-CM

## 2019-11-08 DIAGNOSIS — N1330 Unspecified hydronephrosis: Secondary | ICD-10-CM | POA: Diagnosis present

## 2019-11-08 DIAGNOSIS — I110 Hypertensive heart disease with heart failure: Secondary | ICD-10-CM | POA: Diagnosis present

## 2019-11-08 LAB — LACTIC ACID, PLASMA: Lactic Acid, Venous: 1.6 mmol/L (ref 0.5–1.9)

## 2019-11-08 LAB — COMPREHENSIVE METABOLIC PANEL
ALT: 17 U/L (ref 0–44)
AST: 27 U/L (ref 15–41)
Albumin: 4.1 g/dL (ref 3.5–5.0)
Alkaline Phosphatase: 64 U/L (ref 38–126)
Anion gap: 10 (ref 5–15)
BUN: 8 mg/dL (ref 8–23)
CO2: 27 mmol/L (ref 22–32)
Calcium: 9.9 mg/dL (ref 8.9–10.3)
Chloride: 106 mmol/L (ref 98–111)
Creatinine, Ser: 0.66 mg/dL (ref 0.44–1.00)
GFR calc Af Amer: >60 mL/min (ref >60)
GFR calc non Af Amer: >60 mL/min (ref >60)
Glucose, Bld: 115 mg/dL — ABNORMAL HIGH (ref 70–99)
Potassium: 3.8 mmol/L (ref 3.5–5.1)
Sodium: 143 mmol/L (ref 135–145)
Total Bilirubin: 2.2 mg/dL — ABNORMAL HIGH (ref 0.3–1.2)
Total Protein: 7 g/dL (ref 6.5–8.1)

## 2019-11-08 LAB — CBC WITH DIFFERENTIAL/PLATELET
Abs Immature Granulocytes: 0.11 10*3/uL — ABNORMAL HIGH (ref 0.00–0.07)
Basophils Absolute: 0 10*3/uL (ref 0.0–0.1)
Basophils Relative: 0 %
Eosinophils Absolute: 0 10*3/uL (ref 0.0–0.5)
Eosinophils Relative: 0 %
HCT: 43.6 % (ref 36.0–46.0)
Hemoglobin: 13.5 g/dL (ref 12.0–15.0)
Immature Granulocytes: 1 %
Lymphocytes Relative: 18 %
Lymphs Abs: 1.8 10*3/uL (ref 0.7–4.0)
MCH: 31.3 pg (ref 26.0–34.0)
MCHC: 31 g/dL (ref 30.0–36.0)
MCV: 101.2 fL — ABNORMAL HIGH (ref 80.0–100.0)
Monocytes Absolute: 1.8 10*3/uL — ABNORMAL HIGH (ref 0.1–1.0)
Monocytes Relative: 18 %
Neutro Abs: 6.2 10*3/uL (ref 1.7–7.7)
Neutrophils Relative %: 63 %
Platelets: 112 10*3/uL — ABNORMAL LOW (ref 150–400)
RBC: 4.31 MIL/uL (ref 3.87–5.11)
RDW: 19.6 % — ABNORMAL HIGH (ref 11.5–15.5)
WBC: 9.9 10*3/uL (ref 4.0–10.5)
nRBC: 2.7 % — ABNORMAL HIGH (ref 0.0–0.2)

## 2019-11-08 MED ORDER — SULFAMETHOXAZOLE-TRIMETHOPRIM 800-160 MG PO TABS
1.0000 | ORAL_TABLET | Freq: Two times a day (BID) | ORAL | 0 refills | Status: DC
Start: 2019-11-08 — End: 2019-12-01

## 2019-11-08 MED ORDER — SODIUM CHLORIDE 0.9% FLUSH: 3.0000 mL | Freq: Once | INTRAVENOUS | Status: DC

## 2019-11-08 NOTE — Discharge Instructions (Addendum)
Go to the ER for further evaluation and treatment  I am worried that as long as you have had the infection, with chills, and low temperature that you may become septic. If this is the case, you need more care than we can give you in Urgent Care

## 2019-11-08 NOTE — ED Provider Notes (Signed)
[facility]   [id number] 11/08/19 Arrival Time: 1440  CC: RASH  SUBJECTIVE:  Kathy Irwin is a 69 y.o. female who presents with a skin complaint that began about 2 and half weeks ago.  Reports that she has a diabetic ulcer to her right lower leg.  Reports that she was seen in this office and given a round of Keflex.  Reports that the wound did not improve at all.  Reports that she was seen at primary care afterwards.  Reports that the primary care provider sent in antibiotics, but she did not pick them up because she did not think that she should take antibiotics back to back that way.  Reports chills, right lower leg pain, redness, swelling, tightness in her calf, burning. Denies fever, nausea, vomiting, erythema, swelling, discharge, oral lesions, SOB, chest pain, abdominal pain, changes in bowel or bladder function.    ROS: As per HPI.  All other pertinent ROS negative.     Past Medical History:  Diagnosis Date  . Acute respiratory failure with hypoxia ([place]) 12/2018  . Diabetes mellitus without complication ([place])   . 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 Hypertension   . ILD (interstitial lung disease) ([place]) 12/24/2018   Past Surgical History:  Procedure Laterality Date  . ABDOMINAL HYSTERECTOMY    . VIDEO BRONCHOSCOPY Bilateral 04/02/2019   Procedure: VIDEO BRONCHOSCOPY WITH FLUORO;  Surgeon: Marshell Garfinkel, MD;  Location: [facility];  Service: Cardiopulmonary;  Laterality: Bilateral;   Allergies  Allergen Reactions  . Other Shortness Of Breath and Other (See Comments)    Newspaper ink =  new chest pain, also  . Iodine Other (See Comments)    "Was a long time ago" (Reaction??)  . Merbromin Other (See Comments)    Mercurochrome- "Was a long time ago" (Reaction??)  . Tape    No current facility-administered medications on file prior to encounter.   Current Outpatient Medications on File Prior to Encounter  Medication Sig Dispense Refill  .  acetaminophen (TYLENOL) 500 MG tablet Take 1,000 mg by mouth 2 (two) times daily as needed (for pain).    Marland Kitchen albuterol (VENTOLIN HFA) 108 (90 Base) MCG/ACT inhaler Inhale 1-2 puffs into the lungs every 6 (six) hours as needed for wheezing or shortness of breath. 8 g 0  . Azelastine-Fluticasone (DYMISTA) 137-50 MCG/ACT SUSP Place 1-2 sprays into both nostrils daily as needed (for allergies). 23 g 1  . blood glucose meter kit and supplies KIT Dispense based on patient and insurance preference. Use up to four times daily as directed. (FOR ICD-9 250.00, 250.01). For QAC - HS accuchecks. 1 each 1  . [facility] D3 125 MCG (5000 UT) capsule Take 5,000 Units by mouth daily.    Marland Kitchen dextromethorphan 15 MG/5ML syrup Take 10 mLs (30 mg total) by mouth 4 (four) times daily as needed for cough. 118 mL 0  . diphenhydrAMINE (BENADRYL) 25 MG tablet Take 1 tablet (25 mg total) by mouth at bedtime as needed for itching. 90 tablet 1  . doxycycline (VIBRA-TABS) 100 MG tablet Take 1 tablet (100 mg total) by mouth 2 (two) times daily. 20 tablet 0  . fluticasone (FLONASE) 50 MCG/ACT nasal spray Place 2 sprays into both nostrils daily as needed for allergies or rhinitis.     . furosemide (LASIX) 40 MG tablet Take 40 mg by mouth daily.     Marland Kitchen glimepiride (AMARYL) 4 MG tablet Take 1 tablet (4 mg total) by mouth daily with breakfast. 90 tablet 1  .  glucose blood (FREESTYLE LITE) test strip For glucose testing every before meals at bedtime. Diagnosis E 11.65  Can substitute to any accepted brand 100 each 0  . Insulin Lispro Prot & Lispro (HUMALOG MIX 75/25 KWIKPEN) (75-25) 100 UNIT/ML Kwikpen Inject 80 Units into the skin See admin instructions. Inject 50 units into the skin before breakfast "and a second dose at bedtime 30 units (Patient taking differently: Inject 30-50 Units into the skin See admin instructions. Use 50 units every morning then use 30 units at bedtime) 30 mL 3  . levocetirizine (XYZAL) 5 MG tablet Take 1 tablet by mouth  daily as needed for allergies.     . metoprolol (TOPROL-XL) 200 MG 24 hr tablet Take 1 tablet (200 mg total) by mouth daily. 90 tablet 1  . montelukast (SINGULAIR) 10 MG tablet Take 10 mg by mouth at bedtime.     Marland Kitchen olopatadine (PATANOL) 0.1 % ophthalmic solution Place 1 drop into both eyes 2 (two) times daily. (Patient taking differently: Place 1 drop into both eyes 2 (two) times daily as needed for allergies. ) 5 mL 0  . omeprazole (PRILOSEC) 20 MG capsule Take 1 capsule (20 mg total) by mouth daily. (Patient taking differently: Take 20 mg by mouth daily as needed (GERD). ) 30 capsule 0  . ondansetron (ZOFRAN) 4 MG tablet Take 1 tablet (4 mg total) by mouth every 8 (eight) hours as needed for nausea or vomiting. 12 tablet 0  . pravastatin (PRAVACHOL) 40 MG tablet Take 40 mg by mouth at bedtime.    . pregabalin (LYRICA) 50 MG capsule Take 1 capsule (50 mg total) by mouth 3 (three) times daily. 270 capsule 0  . sertraline (ZOLOFT) 100 MG tablet Take 100 mg by mouth 3 times/day as needed-between meals & bedtime (for nerves).     . sodium chloride (OCEAN) 0.65 % SOLN nasal spray Place 1 spray into both nostrils as needed for congestion. 15 mL 0   Social History   Socioeconomic History  . Marital status: Married    Spouse name: Not on file  . Number of children: Not on file  . Years of education: Not on file  . Highest education level: Not on file  Occupational History  . Not on file  Tobacco Use  . Smoking status: Never Smoker  . Smokeless tobacco: Never Used  Vaping Use  . Vaping Use: Never used  Substance and Sexual Activity  . Alcohol use: Never  . Drug use: Never  . Sexual activity: Not on file  Other Topics Concern  . Not on file  Social History Narrative  . Not on file   Social Determinants of Health   Financial Resource Strain:   . Difficulty of Paying Living Expenses:   Food Insecurity:   . Worried About Charity fundraiser in the Last Year:   . Arboriculturist in the  Last Year:   Transportation Needs:   . Film/video editor (Medical):   Marland Kitchen Lack of Transportation (Non-Medical):   Physical Activity:   . Days of Exercise per Week:   . Minutes of Exercise per Session:   Stress:   . Feeling of Stress :   Social Connections:   . Frequency of Communication with Friends and Family:   . Frequency of Social Gatherings with Friends and Family:   . Attends Religious Services:   . Active Member of Clubs or Organizations:   . Attends Archivist Meetings:   .  Marital Status:   Intimate Partner Violence:   . Fear of Current or Ex-Partner:   . Emotionally Abused:   Marland Kitchen Physically Abused:   . Sexually Abused:    Family History  Problem Relation Age of Onset  . Diabetes Other   . Hypertension Other     OBJECTIVE: Vitals:   11/08/19 1508  BP: (!) 135/70  Pulse: 94  Temp: (!) 97.4 F (36.3 C)  TempSrc: Oral  SpO2: 92%    General appearance: Fatigued; alert; no distress Head: NCAT Lungs: clear to auscultation bilaterally Heart: regular rate and rhythm.  Radial pulse 2+ bilaterally Extremities: no edema Skin: warm and dry; anterior aspect of right lower leg with about 1 cm open blister, surrounding tissues are erythematous, swollen, tender to touch, erythema extends to up above the knee Psychological: alert and cooperative; normal mood and affect  ASSESSMENT & PLAN:  1. Diabetic ulcer of right lower leg ([place])   2. Right leg pain   3. Chills   4. Hypothermia, initial encounter   5. Other fatigue     Meds ordered this encounter  Medications  . sulfamethoxazole-trimethoprim (BACTRIM DS) 800-160 MG tablet    Sig: Take 1 tablet by mouth 2 (two) times daily for 7 days.    Dispense:  14 tablet    Refill:  0    Order Specific Question:   Supervising Provider    Answer:   Chase Picket [[id number]]    Discussed with patient that I am worried that her infection has moved to her blood since she is presenting with a low temperature,  chills, erythema extending up past the knee Discussed with patient that I think that she would be better served to be further evaluated in the ER Concern for sepsis at this point Patient is agreeable to treatment plan and will go to the ER via private vehicle    Faustino Congress, NP 11/09/19 0945

## 2019-11-08 NOTE — ED Triage Notes (Signed)
Pt presents to UC for blister on left lower leg x2 weeks. Pt states she was seen for same on 7/9, given antibiotics completed course with no relief. Pt complaining of pain and "tightness at site". Pt denies drainage at site. Pt denies fever/chills, n/v/d.

## 2019-11-08 NOTE — ED Triage Notes (Signed)
Pt arrives to ED w/ c/o infection on RLE that started as a blister 2 weeks ago. Pt states she has been taking ABX but infection has not gotten better. Pt temp 100.3 F, n/v/d. Pt reports 8/10 pain.

## 2019-11-08 NOTE — Progress Notes (Signed)
Subjective:  Patient ID: Kathy Irwin, female    DOB: 1950-08-11  Age: 69 y.o. MRN: [id number]  CC: Diabetes (Pt. is here for diabetes follow up. )   HPI Kathy Irwin presents for management of T2D and a wound on her right leg.   No facility-administered medications prior to visit.   Outpatient Medications Prior to Visit  Medication Sig Dispense Refill  . acetaminophen (TYLENOL) 500 MG tablet Take 1,000 mg by mouth 2 (two) times daily as needed (for pain).    Marland Kitchen albuterol (VENTOLIN HFA) 108 (90 Base) MCG/ACT inhaler Inhale 1-2 puffs into the lungs every 6 (six) hours as needed for wheezing or shortness of breath. 8 g 0  . Azelastine-Fluticasone (DYMISTA) 137-50 MCG/ACT SUSP Place 1-2 sprays into both nostrils daily as needed (for allergies). 23 g 1  . blood glucose meter kit and supplies KIT Dispense based on patient and insurance preference. Use up to four times daily as directed. (FOR ICD-9 250.00, 250.01). For QAC - HS accuchecks. 1 each 1  . CVS D3 125 MCG (5000 UT) capsule Take 5,000 Units by mouth daily.    Marland Kitchen dextromethorphan 15 MG/5ML syrup Take 10 mLs (30 mg total) by mouth 4 (four) times daily as needed for cough. 118 mL 0  . diphenhydrAMINE (BENADRYL) 25 MG tablet Take 1 tablet (25 mg total) by mouth at bedtime as needed for itching. 90 tablet 1  . fluticasone (FLONASE) 50 MCG/ACT nasal spray Place 2 sprays into both nostrils daily as needed for allergies or rhinitis.     . furosemide (LASIX) 40 MG tablet Take 40 mg by mouth.    Marland Kitchen glimepiride (AMARYL) 4 MG tablet Take 1 tablet (4 mg total) by mouth daily with breakfast. 90 tablet 1  . glucose blood (FREESTYLE LITE) test strip For glucose testing every before meals at bedtime. Diagnosis E 11.65  Can substitute to any accepted brand 100 each 0  . HYDROcodone-acetaminophen (NORCO) 5-325 MG tablet Take 1 tablet by mouth every 6 (six) hours as needed for moderate pain. 30 tablet 0  . Insulin Lispro Prot & Lispro (HUMALOG MIX 75/25  KWIKPEN) (75-25) 100 UNIT/ML Kwikpen Inject 80 Units into the skin See admin instructions. Inject 50 units into the skin before breakfast "and a second dose at bedtime 30 units (Patient taking differently: Inject 30-50 Units into the skin See admin instructions. Inject 50 units into the skin before breakfast and a second dose at bedtime 30 units) 30 mL 3  . levocetirizine (XYZAL) 5 MG tablet Take 1 tablet by mouth daily as needed for allergies.     . metolazone (ZAROXOLYN) 2.5 MG tablet Take 2.5 mg by mouth three times a week, take 30 minutes before taking lasix 30 tablet 5  . metoprolol (TOPROL-XL) 200 MG 24 hr tablet Take 1 tablet (200 mg total) by mouth daily. 90 tablet 1  . montelukast (SINGULAIR) 10 MG tablet Take 10 mg by mouth at bedtime.     Marland Kitchen olopatadine (PATANOL) 0.1 % ophthalmic solution Place 1 drop into both eyes 2 (two) times daily. (Patient taking differently: Place 1 drop into both eyes 2 (two) times daily as needed for allergies. ) 5 mL 0  . omeprazole (PRILOSEC) 20 MG capsule Take 1 capsule (20 mg total) by mouth daily. (Patient taking differently: Take 20 mg by mouth daily as needed (GERD). ) 30 capsule 0  . ondansetron (ZOFRAN) 4 MG tablet Take 1 tablet (4 mg total) by mouth every 8 (eight) hours  as needed for nausea or vomiting. 12 tablet 0  . pravastatin (PRAVACHOL) 40 MG tablet Take 40 mg by mouth at bedtime.    . pregabalin (LYRICA) 50 MG capsule Take 1 capsule (50 mg total) by mouth 3 (three) times daily. 270 capsule 0  . sertraline (ZOLOFT) 100 MG tablet Take 100 mg by mouth at bedtime.    . sodium chloride (OCEAN) 0.65 % SOLN nasal spray Place 1 spray into both nostrils as needed for congestion. 15 mL 0  . metFORMIN (GLUCOPHAGE) 1000 MG tablet Take 1,000 mg by mouth 2 (two) times daily with a meal. (Patient not taking: Reported on 11/04/2019)      ROS Review of Systems  Skin: Positive for wound.       See picture   All other systems reviewed and are negative.    Objective:  BP 131/79 (BP Location: Left Arm, Patient Position: Sitting, Cuff Size: Large)   Pulse 72   Temp 98.1 F (36.7 C) (Oral)   Wt 262 lb 3.2 oz (118.9 kg)   SpO2 (!) 85%   BMI 46.45 kg/m     Physical Exam Vitals reviewed.  Constitutional:      Appearance: She is obese.  Cardiovascular:     Rate and Rhythm: Normal rate and regular rhythm.  Pulmonary:     Effort: Pulmonary effort is normal.     Breath sounds: Normal breath sounds.  Abdominal:     General: Bowel sounds are normal. There is distension.  Musculoskeletal:     Cervical back: Normal range of motion.     Comments: Presents in a wheel chair  Skin:    Comments: Wound present   Neurological:     Mental Status: She is alert and oriented to person, place, and time.  Psychiatric:        Mood and Affect: Mood normal.        Behavior: Behavior normal.      Assessment & Plan:   Kathy Irwin was seen today for diabetes.  Diagnoses and all orders for this visit:  Type 2 diabetes mellitus without complication, without long-term current use of insulin (HCC) Not at Goal of therapy: Less than 6.5 hemoglobin A1c.  Improved in   3 months from 8.0 to today 7.1.  Monitor foods that are high in carbohydrates are the following rice, potatoes, breads, sugars, and pastas.  Reduction in the intake (eating) will assist in lowering your blood sugars. -     Glucose (CBG) -     HgB A1c 7.1 -     Microalbumin/Creatinine Ratio, Urine   Diabetic ulcer of right foot associated with type 2 diabetes mellitus, limited to breakdown of skin, unspecified part of foot ([place])  Antibiotic treatment and   VAS [place] LOWER EXTREMITY VENOUS (DVT); Future  No orders of the defined types were placed in this encounter.   Follow-up: Return in about 3 months (around 02/04/2020) for DM.   Kerin Perna NP

## 2019-11-09 ENCOUNTER — Inpatient Hospital Stay (HOSPITAL_COMMUNITY): Payer: Medicare Other

## 2019-11-09 DIAGNOSIS — E870 Hyperosmolality and hypernatremia: Secondary | ICD-10-CM | POA: Diagnosis present

## 2019-11-09 DIAGNOSIS — J9601 Acute respiratory failure with hypoxia: Secondary | ICD-10-CM | POA: Diagnosis not present

## 2019-11-09 DIAGNOSIS — M7989 Other specified soft tissue disorders: Secondary | ICD-10-CM | POA: Diagnosis not present

## 2019-11-09 DIAGNOSIS — N1330 Unspecified hydronephrosis: Secondary | ICD-10-CM | POA: Diagnosis present

## 2019-11-09 DIAGNOSIS — R54 Age-related physical debility: Secondary | ICD-10-CM | POA: Diagnosis present

## 2019-11-09 DIAGNOSIS — I5032 Chronic diastolic (congestive) heart failure: Secondary | ICD-10-CM | POA: Diagnosis not present

## 2019-11-09 DIAGNOSIS — A419 Sepsis, unspecified organism: Secondary | ICD-10-CM | POA: Diagnosis present

## 2019-11-09 DIAGNOSIS — L03115 Cellulitis of right lower limb: Secondary | ICD-10-CM | POA: Diagnosis present

## 2019-11-09 DIAGNOSIS — R6521 Severe sepsis with septic shock: Secondary | ICD-10-CM | POA: Diagnosis not present

## 2019-11-09 DIAGNOSIS — J9611 Chronic respiratory failure with hypoxia: Secondary | ICD-10-CM | POA: Diagnosis not present

## 2019-11-09 DIAGNOSIS — E662 Morbid (severe) obesity with alveolar hypoventilation: Secondary | ICD-10-CM | POA: Diagnosis present

## 2019-11-09 DIAGNOSIS — N179 Acute kidney failure, unspecified: Secondary | ICD-10-CM | POA: Diagnosis present

## 2019-11-09 DIAGNOSIS — J9622 Acute and chronic respiratory failure with hypercapnia: Secondary | ICD-10-CM | POA: Diagnosis present

## 2019-11-09 DIAGNOSIS — R1115 Cyclical vomiting syndrome unrelated to migraine: Secondary | ICD-10-CM

## 2019-11-09 DIAGNOSIS — Z6841 Body Mass Index (BMI) 40.0 and over, adult: Secondary | ICD-10-CM | POA: Diagnosis not present

## 2019-11-09 DIAGNOSIS — K8010 Calculus of gallbladder with chronic cholecystitis without obstruction: Secondary | ICD-10-CM | POA: Diagnosis present

## 2019-11-09 DIAGNOSIS — Z9911 Dependence on respirator [ventilator] status: Secondary | ICD-10-CM | POA: Diagnosis not present

## 2019-11-09 DIAGNOSIS — I10 Essential (primary) hypertension: Secondary | ICD-10-CM

## 2019-11-09 DIAGNOSIS — E860 Dehydration: Secondary | ICD-10-CM | POA: Diagnosis present

## 2019-11-09 DIAGNOSIS — J9621 Acute and chronic respiratory failure with hypoxia: Secondary | ICD-10-CM | POA: Diagnosis present

## 2019-11-09 DIAGNOSIS — G9340 Encephalopathy, unspecified: Secondary | ICD-10-CM | POA: Diagnosis not present

## 2019-11-09 DIAGNOSIS — K5900 Constipation, unspecified: Secondary | ICD-10-CM | POA: Diagnosis present

## 2019-11-09 DIAGNOSIS — I110 Hypertensive heart disease with heart failure: Secondary | ICD-10-CM | POA: Diagnosis present

## 2019-11-09 DIAGNOSIS — Z20822 Contact with and (suspected) exposure to covid-19: Secondary | ICD-10-CM | POA: Diagnosis present

## 2019-11-09 DIAGNOSIS — I5033 Acute on chronic diastolic (congestive) heart failure: Secondary | ICD-10-CM | POA: Diagnosis present

## 2019-11-09 DIAGNOSIS — E876 Hypokalemia: Secondary | ICD-10-CM | POA: Diagnosis present

## 2019-11-09 DIAGNOSIS — G9341 Metabolic encephalopathy: Secondary | ICD-10-CM | POA: Diagnosis present

## 2019-11-09 DIAGNOSIS — R6520 Severe sepsis without septic shock: Secondary | ICD-10-CM | POA: Diagnosis present

## 2019-11-09 DIAGNOSIS — E119 Type 2 diabetes mellitus without complications: Secondary | ICD-10-CM

## 2019-11-09 DIAGNOSIS — L0390 Cellulitis, unspecified: Secondary | ICD-10-CM

## 2019-11-09 DIAGNOSIS — D693 Immune thrombocytopenic purpura: Secondary | ICD-10-CM | POA: Diagnosis present

## 2019-11-09 DIAGNOSIS — Z8616 Personal history of COVID-19: Secondary | ICD-10-CM | POA: Diagnosis not present

## 2019-11-09 DIAGNOSIS — J849 Interstitial pulmonary disease, unspecified: Secondary | ICD-10-CM | POA: Diagnosis present

## 2019-11-09 DIAGNOSIS — Z794 Long term (current) use of insulin: Secondary | ICD-10-CM

## 2019-11-09 DIAGNOSIS — E1165 Type 2 diabetes mellitus with hyperglycemia: Secondary | ICD-10-CM | POA: Diagnosis present

## 2019-11-09 LAB — CBG MONITORING, ED
Glucose-Capillary: 152 mg/dL — ABNORMAL HIGH (ref 70–99)
Glucose-Capillary: 197 mg/dL — ABNORMAL HIGH (ref 70–99)
Glucose-Capillary: 170 mg/dL — ABNORMAL HIGH (ref 70–99)
Glucose-Capillary: 162 mg/dL — ABNORMAL HIGH (ref 70–99)
Glucose-Capillary: 159 mg/dL — ABNORMAL HIGH (ref 70–99)

## 2019-11-09 LAB — URINALYSIS, ROUTINE W REFLEX MICROSCOPIC
Bilirubin Urine: NEGATIVE
Glucose, UA: NEGATIVE mg/dL
Hgb urine dipstick: NEGATIVE
Ketones, ur: NEGATIVE mg/dL
Leukocytes,Ua: NEGATIVE
Nitrite: NEGATIVE
Protein, ur: NEGATIVE mg/dL
Specific Gravity, Urine: 1.021 (ref 1.005–1.030)
pH: 7 (ref 5.0–8.0)

## 2019-11-09 LAB — SARS CORONAVIRUS 2 BY RT PCR (HOSPITAL ORDER, PERFORMED IN ~~LOC~~ HOSPITAL LAB): SARS Coronavirus 2: NEGATIVE

## 2019-11-09 LAB — HEMOGLOBIN A1C
Hgb A1c MFr Bld: 7.2 % — ABNORMAL HIGH (ref 4.8–5.6)
Mean Plasma Glucose: 159.94 mg/dL

## 2019-11-09 MED ORDER — PIPERACILLIN-TAZOBACTAM 3.375 G IVPB 30 MIN
3.3750 g | Freq: Once | INTRAVENOUS | Status: AC
  Administered 2019-11-09: 3.375 g via INTRAVENOUS
  Filled 2019-11-09: qty 50

## 2019-11-09 MED ORDER — ENOXAPARIN SODIUM 40 MG/0.4ML ~~LOC~~ SOLN
40.0000 mg | SUBCUTANEOUS | Status: DC
  Administered 2019-11-09 – 2019-11-18 (×10): 40 mg via SUBCUTANEOUS
  Filled 2019-11-09 (×10): qty 0.4

## 2019-11-09 MED ORDER — PREGABALIN 25 MG PO CAPS
50.0000 mg | ORAL_CAPSULE | Freq: Three times a day (TID) | ORAL | Status: DC
  Administered 2019-11-09: 50 mg via ORAL
  Filled 2019-11-09: qty 2

## 2019-11-09 MED ORDER — METOPROLOL SUCCINATE ER 50 MG PO TB24
200.0000 mg | ORAL_TABLET | Freq: Every day | ORAL | Status: DC
  Administered 2019-11-09 – 2019-11-10 (×2): 200 mg via ORAL
  Filled 2019-11-09 (×2): qty 2

## 2019-11-09 MED ORDER — SERTRALINE HCL 50 MG PO TABS
100.0000 mg | ORAL_TABLET | Freq: Every day | ORAL | Status: DC
  Administered 2019-11-10: 100 mg via ORAL
  Filled 2019-11-09 (×2): qty 1

## 2019-11-09 MED ORDER — SODIUM CHLORIDE 0.9 % IV SOLN: INTRAVENOUS | Status: DC

## 2019-11-09 MED ORDER — INSULIN ASPART PROT & ASPART (70-30 MIX) 100 UNIT/ML ~~LOC~~ SUSP
50.0000 [IU] | Freq: Every day | SUBCUTANEOUS | Status: DC
  Filled 2019-11-09: qty 10

## 2019-11-09 MED ORDER — ALBUTEROL SULFATE (2.5 MG/3ML) 0.083% IN NEBU: 3.0000 mL | INHALATION_SOLUTION | Freq: Four times a day (QID) | RESPIRATORY_TRACT | Status: DC | PRN

## 2019-11-09 MED ORDER — IOHEXOL 300 MG/ML  SOLN
100.0000 mL | Freq: Once | INTRAMUSCULAR | Status: AC | PRN
  Administered 2019-11-09: 100 mL via INTRAVENOUS

## 2019-11-09 MED ORDER — CHLORHEXIDINE GLUCONATE CLOTH 2 % EX PADS: 6.0000 | MEDICATED_PAD | Freq: Once | CUTANEOUS | Status: DC

## 2019-11-09 MED ORDER — LORATADINE 10 MG PO TABS: 10.0000 mg | ORAL_TABLET | Freq: Every day | ORAL | Status: DC | PRN

## 2019-11-09 MED ORDER — FUROSEMIDE 20 MG PO TABS
40.0000 mg | ORAL_TABLET | Freq: Every day | ORAL | Status: DC
  Administered 2019-11-09: 40 mg via ORAL
  Filled 2019-11-09: qty 2

## 2019-11-09 MED ORDER — MONTELUKAST SODIUM 10 MG PO TABS
10.0000 mg | ORAL_TABLET | Freq: Every day | ORAL | Status: DC
  Filled 2019-11-09: qty 1

## 2019-11-09 MED ORDER — DIPHENHYDRAMINE HCL 25 MG PO CAPS: 25.0000 mg | ORAL_CAPSULE | Freq: Every evening | ORAL | Status: DC | PRN

## 2019-11-09 MED ORDER — FLUTICASONE PROPIONATE 50 MCG/ACT NA SUSP: 2.0000 | Freq: Every day | NASAL | Status: DC | PRN

## 2019-11-09 MED ORDER — PANTOPRAZOLE SODIUM 40 MG PO TBEC
40.0000 mg | DELAYED_RELEASE_TABLET | Freq: Every day | ORAL | Status: DC
  Administered 2019-11-09: 40 mg via ORAL
  Filled 2019-11-09: qty 1

## 2019-11-09 MED ORDER — GLIMEPIRIDE 4 MG PO TABS
4.0000 mg | ORAL_TABLET | Freq: Every day | ORAL | Status: DC
  Filled 2019-11-09: qty 1

## 2019-11-09 MED ORDER — VANCOMYCIN HCL 2000 MG/400ML IV SOLN
2000.0000 mg | Freq: Once | INTRAVENOUS | Status: AC
  Administered 2019-11-09: 2000 mg via INTRAVENOUS
  Filled 2019-11-09: qty 400

## 2019-11-09 MED ORDER — PIPERACILLIN-TAZOBACTAM 3.375 G IVPB
3.3750 g | Freq: Three times a day (TID) | INTRAVENOUS | Status: AC
  Administered 2019-11-09 – 2019-11-16 (×23): 3.375 g via INTRAVENOUS
  Filled 2019-11-09 (×24): qty 50

## 2019-11-09 MED ORDER — ONDANSETRON HCL 4 MG PO TABS: 4.0000 mg | ORAL_TABLET | Freq: Four times a day (QID) | ORAL | Status: DC | PRN

## 2019-11-09 MED ORDER — OLOPATADINE HCL 0.1 % OP SOLN: 1.0000 [drp] | Freq: Two times a day (BID) | OPHTHALMIC | Status: DC | PRN

## 2019-11-09 MED ORDER — ONDANSETRON HCL 4 MG/2ML IJ SOLN
4.0000 mg | Freq: Four times a day (QID) | INTRAMUSCULAR | Status: DC | PRN
  Administered 2019-11-09: 4 mg via INTRAVENOUS
  Filled 2019-11-09: qty 2

## 2019-11-09 MED ORDER — HYDROCODONE-ACETAMINOPHEN 5-325 MG PO TABS
1.0000 | ORAL_TABLET | ORAL | Status: DC | PRN
  Administered 2019-11-09 (×2): 1 via ORAL
  Filled 2019-11-09 (×2): qty 1

## 2019-11-09 MED ORDER — PROMETHAZINE HCL 25 MG/ML IJ SOLN
12.5000 mg | Freq: Four times a day (QID) | INTRAMUSCULAR | Status: DC | PRN
  Administered 2019-11-09: 12.5 mg via INTRAVENOUS
  Filled 2019-11-09: qty 1

## 2019-11-09 MED ORDER — ACETAMINOPHEN 650 MG RE SUPP
650.0000 mg | Freq: Four times a day (QID) | RECTAL | Status: DC | PRN
  Administered 2019-11-10: 650 mg via RECTAL
  Filled 2019-11-09: qty 1

## 2019-11-09 MED ORDER — INSULIN ASPART 100 UNIT/ML ~~LOC~~ SOLN
0.0000 [IU] | Freq: Three times a day (TID) | SUBCUTANEOUS | Status: DC
  Administered 2019-11-10 (×2): 1 [IU] via SUBCUTANEOUS
  Administered 2019-11-10: 2 [IU] via SUBCUTANEOUS
  Administered 2019-11-11: 1 [IU] via SUBCUTANEOUS

## 2019-11-09 MED ORDER — ACETAMINOPHEN 500 MG PO TABS: 1000.0000 mg | ORAL_TABLET | Freq: Two times a day (BID) | ORAL | Status: DC | PRN

## 2019-11-09 MED ORDER — INSULIN ASPART PROT & ASPART (70-30 MIX) 100 UNIT/ML ~~LOC~~ SUSP
30.0000 [IU] | Freq: Every day | SUBCUTANEOUS | Status: DC
  Filled 2019-11-09: qty 10

## 2019-11-09 MED ORDER — FAMOTIDINE IN NACL 20-0.9 MG/50ML-% IV SOLN
20.0000 mg | Freq: Two times a day (BID) | INTRAVENOUS | Status: DC
  Administered 2019-11-09 – 2019-11-11 (×5): 20 mg via INTRAVENOUS
  Filled 2019-11-09 (×5): qty 50

## 2019-11-09 MED ORDER — ACETAMINOPHEN 325 MG PO TABS
650.0000 mg | ORAL_TABLET | Freq: Four times a day (QID) | ORAL | Status: DC | PRN
  Administered 2019-11-10 – 2019-11-26 (×9): 650 mg via ORAL
  Filled 2019-11-09 (×8): qty 2

## 2019-11-09 MED ORDER — VANCOMYCIN HCL 750 MG/150ML IV SOLN
750.0000 mg | Freq: Two times a day (BID) | INTRAVENOUS | Status: DC
  Administered 2019-11-09 – 2019-11-11 (×4): 750 mg via INTRAVENOUS
  Filled 2019-11-09 (×6): qty 150

## 2019-11-09 MED ORDER — PRAVASTATIN SODIUM 40 MG PO TABS
40.0000 mg | ORAL_TABLET | Freq: Every day | ORAL | Status: DC
  Administered 2019-11-10: 40 mg via ORAL
  Filled 2019-11-09: qty 1

## 2019-11-09 MED ORDER — VITAMIN D 25 MCG (1000 UNIT) PO TABS
5000.0000 [IU] | ORAL_TABLET | Freq: Every day | ORAL | Status: DC
  Administered 2019-11-09: 5000 [IU] via ORAL
  Filled 2019-11-09: qty 5

## 2019-11-09 MED ORDER — INSULIN LISPRO PROT & LISPRO (75-25 MIX) 100 UNIT/ML KWIKPEN: 30.0000 [IU] | PEN_INJECTOR | SUBCUTANEOUS | Status: DC

## 2019-11-09 MED ORDER — SERTRALINE HCL 100 MG PO TABS: 100.0000 mg | ORAL_TABLET | Freq: Two times a day (BID) | ORAL | Status: DC | PRN

## 2019-11-09 MED ORDER — CEFAZOLIN SODIUM-DEXTROSE 2-4 GM/100ML-% IV SOLN
2.0000 g | INTRAVENOUS | Status: DC
  Filled 2019-11-09: qty 100

## 2019-11-09 MED ORDER — SODIUM CHLORIDE 0.9 % IV BOLUS
500.0000 mL | Freq: Once | INTRAVENOUS | Status: AC
  Administered 2019-11-09: 500 mL via INTRAVENOUS

## 2019-11-09 NOTE — ED Notes (Signed)
Paging provider about pt nausea.

## 2019-11-09 NOTE — H&P (Addendum)
History and Physical    Kathy Irwin KDT:[id number] DOB: 17-Mar-1951 DOA: 11/08/2019  PCP: Kerin Perna, NP  Patient coming from: Home  I have personally briefly reviewed patient's old medical records in [place]  Chief Complaint: Cellulitis  HPI: Kathy Irwin is a 69 y.o. female with medical history significant of HTN, DM2, ILD with chronic O2 requirement, diastolic CHF.  Pt presents to ED with a couple of weeks of worsening erythema, edema of RLE.  She was started on Keflex as an outpt earlier this month.  Despite taking 10 days of this med though, her cellulitis has progressed and worsened.  She went to UC today who sent her in to the ED.   ED Course: Pt put on zosyn / vanc in ED.   Review of Systems: As per HPI, otherwise all review of systems negative.  Past Medical History:  Diagnosis Date  . Acute respiratory failure with hypoxia ([place]) 12/2018  . Diabetes mellitus without complication ([place])   . Hypersensitivity pneumonitis ([place]) 12/19/2017  . Hypertension   . ILD (interstitial lung disease) ([place]) 12/24/2018    Past Surgical History:  Procedure Laterality Date  . ABDOMINAL HYSTERECTOMY    . VIDEO BRONCHOSCOPY Bilateral 04/02/2019   Procedure: VIDEO BRONCHOSCOPY WITH FLUORO;  Surgeon: Marshell Garfinkel, MD;  Location: [facility];  Service: Cardiopulmonary;  Laterality: Bilateral;     reports that she has never smoked. She has never used smokeless tobacco. She reports that she does not drink alcohol and does not use drugs.  Allergies  Allergen Reactions  . Other Shortness Of Breath and Other (See Comments)    Newspaper ink =  new chest pain, also  . Iodine Other (See Comments)    "Was a long time ago" (Reaction??)  . Merbromin Other (See Comments)    Mercurochrome- "Was a long time ago" (Reaction??)  . Tape     Family History  Problem Relation Age of Onset  . Diabetes Other   . Hypertension Other      Prior to Admission medications     Medication Sig Start Date End Date Taking? Authorizing Provider  acetaminophen (TYLENOL) 500 MG tablet Take 1,000 mg by mouth 2 (two) times daily as needed (for pain).   Yes [provider]  albuterol (VENTOLIN HFA) 108 (90 Base) MCG/ACT inhaler Inhale 1-2 puffs into the lungs every 6 (six) hours as needed for wheezing or shortness of breath. 07/24/19  Yes Wieters, Hallie C, PA-C  Azelastine-Fluticasone (DYMISTA) 137-50 MCG/ACT SUSP Place 1-2 sprays into both nostrils daily as needed (for allergies). 05/15/19  Yes Kerin Perna, NP  [employer] D3 125 MCG (5000 UT) capsule Take 5,000 Units by mouth daily. 07/16/19  Yes [provider]  dextromethorphan 15 MG/5ML syrup Take 10 mLs (30 mg total) by mouth 4 (four) times daily as needed for cough. 08/12/19  Yes Dessa Phi, DO  diphenhydrAMINE (BENADRYL) 25 MG tablet Take 1 tablet (25 mg total) by mouth at bedtime as needed for itching. 09/02/19  Yes Kerin Perna, NP  fluticasone (FLONASE) 50 MCG/ACT nasal spray Place 2 sprays into both nostrils daily as needed for allergies or rhinitis.  07/15/19  Yes [provider]  furosemide (LASIX) 40 MG tablet Take 40 mg by mouth daily.    Yes [provider]  glimepiride (AMARYL) 4 MG tablet Take 1 tablet (4 mg total) by mouth daily with breakfast. 09/02/19  Yes Kerin Perna, NP  Insulin Lispro Prot & Lispro (HUMALOG MIX  75/25 KWIKPEN) (75-25) 100 UNIT/ML Kwikpen Inject 80 Units into the skin See admin instructions. Inject 50 units into the skin before breakfast "and a second dose at bedtime 30 units Patient taking differently: Inject 30-50 Units into the skin See admin instructions. Use 50 units every morning then use 30 units at bedtime 05/15/19  Yes Kerin Perna, NP  levocetirizine (XYZAL) 5 MG tablet Take 1 tablet by mouth daily as needed for allergies.  07/15/19  Yes [provider]  metoprolol (TOPROL-XL) 200 MG 24 hr tablet Take 1 tablet (200 mg  total) by mouth daily. 09/02/19  Yes Kerin Perna, NP  montelukast (SINGULAIR) 10 MG tablet Take 10 mg by mouth at bedtime.  05/31/19  Yes [provider]  olopatadine (PATANOL) 0.1 % ophthalmic solution Place 1 drop into both eyes 2 (two) times daily. Patient taking differently: Place 1 drop into both eyes 2 (two) times daily as needed for allergies.  07/24/19  Yes Wieters, Hallie C, PA-C  omeprazole (PRILOSEC) 20 MG capsule Take 1 capsule (20 mg total) by mouth daily. Patient taking differently: Take 20 mg by mouth daily as needed (GERD).  12/26/18  Yes Hongalgi, Lenis Dickinson, MD  ondansetron (ZOFRAN) 4 MG tablet Take 1 tablet (4 mg total) by mouth every 8 (eight) hours as needed for nausea or vomiting. 08/15/19  Yes Rancour, Annie Main, MD  pravastatin (PRAVACHOL) 40 MG tablet Take 40 mg by mouth at bedtime.   Yes [provider]  pregabalin (LYRICA) 50 MG capsule Take 1 capsule (50 mg total) by mouth 3 (three) times daily. 09/02/19  Yes Kerin Perna, NP  sertraline (ZOLOFT) 100 MG tablet Take 100 mg by mouth 3 times/day as needed-between meals & bedtime (for nerves).    Yes [provider]  sodium chloride (OCEAN) 0.65 % SOLN nasal spray Place 1 spray into both nostrils as needed for congestion. 08/12/19  Yes Dessa Phi, DO  blood glucose meter kit and supplies KIT Dispense based on patient and insurance preference. Use up to four times daily as directed. (FOR ICD-9 250.00, 250.01). For QAC - HS accuchecks. 11/22/18   Thurnell Lose, MD  doxycycline (VIBRA-TABS) 100 MG tablet Take 1 tablet (100 mg total) by mouth 2 (two) times daily. 11/07/19   Kerin Perna, NP  glucose blood (FREESTYLE LITE) test strip For glucose testing every before meals at bedtime. Diagnosis E 11.65  Can substitute to any accepted brand 11/22/18   Thurnell Lose, MD  HYDROcodone-acetaminophen (NORCO) 5-325 MG tablet Take 1 tablet by mouth every 6 (six) hours as needed for moderate  pain. Patient not taking: Reported on 11/09/2019 09/05/19   Brunetta Genera, MD  metolazone (ZAROXOLYN) 2.5 MG tablet Take 2.5 mg by mouth three times a week, take 30 minutes before taking lasix Patient not taking: Reported on 11/09/2019 05/06/19   Troy Sine, MD  sulfamethoxazole-trimethoprim (BACTRIM DS) 800-160 MG tablet Take 1 tablet by mouth 2 (two) times daily for 7 days. 11/08/19 11/15/19  Faustino Congress, NP    Physical Exam: Vitals:   11/08/19 1803 11/08/19 2036 11/08/19 2307 11/09/19 0226  BP: (!) 144/65 (!) 95/38 (!) 107/51 (!) 123/64  Pulse: 98 90 83 84  Resp: _0 Temp:      TempSrc:      SpO2: 96% 98% 100% 100%    Constitutional: NAD, calm, comfortable Eyes: PERRL, lids and conjunctivae normal ENMT: Mucous membranes are moist. Posterior pharynx clear of any  exudate or lesions.Normal dentition.  Neck: normal, supple, no masses, no thyromegaly Respiratory: clear to auscultation bilaterally, no wheezing, no crackles. Normal respiratory effort. No accessory muscle use.  Cardiovascular: Regular rate and rhythm, no murmurs / rubs / gallops. No extremity edema. 2+ pedal pulses. No carotid bruits.  Abdomen: no tenderness, no masses palpated. No hepatosplenomegaly. Bowel sounds positive.  Musculoskeletal: no clubbing / cyanosis. No joint deformity upper and lower extremities. Good ROM, no contractures. Normal muscle tone.  Skin:    Neurologic: CN 2-12 grossly intact. Sensation intact, DTR normal. Strength 5/5 in all 4.  Psychiatric: Normal judgment and insight. Alert and oriented x 3. Normal mood.    Labs on Admission: I have personally reviewed following labs and imaging studies  CBC: Recent Labs  Lab 11/08/19 1705  WBC 9.9  NEUTROABS 6.2  HGB 13.5  HCT 43.6  MCV 101.2*  PLT 092*   Basic Metabolic Panel: Recent Labs  Lab 11/08/19 1705  NA 143  K 3.8  CL 106  CO2 27  GLUCOSE 115*  BUN 8  CREATININE 0.66  CALCIUM 9.9   GFR: Estimated  Creatinine Clearance: 82.8 mL/min (by C-G formula based on SCr of 0.66 mg/dL). Liver Function Tests: Recent Labs  Lab 11/08/19 1705  AST 27  ALT 17  ALKPHOS 64  BILITOT 2.2*  PROT 7.0  ALBUMIN 4.1   No results for input(s): LIPASE, AMYLASE in the last 168 hours. No results for input(s): AMMONIA in the last 168 hours. Coagulation Profile: No results for input(s): INR, PROTIME in the last 168 hours. Cardiac Enzymes: No results for input(s): CKTOTAL, CKMB, CKMBINDEX, TROPONINI in the last 168 hours. BNP (last 3 results) Recent Labs    03/03/19 1151  PROBNP 70.0   HbA1C: No results for input(s): HGBA1C in the last 72 hours. CBG: No results for input(s): GLUCAP in the last 168 hours. Lipid Profile: No results for input(s): CHOL, HDL, LDLCALC, TRIG, CHOLHDL, LDLDIRECT in the last 72 hours. Thyroid Function Tests: No results for input(s): TSH, T4TOTAL, FREET4, T3FREE, THYROIDAB in the last 72 hours. Anemia Panel: No results for input(s): VITAMINB12, FOLATE, FERRITIN, TIBC, IRON, RETICCTPCT in the last 72 hours. Urine analysis:    Component Value Date/Time   COLORURINE YELLOW 08/14/2019 1956   APPEARANCEUR CLEAR 08/14/2019 1956   LABSPEC 1.020 08/14/2019 1956   PHURINE 7.0 08/14/2019 1956   GLUCOSEU NEGATIVE 08/14/2019 1956   HGBUR NEGATIVE 08/14/2019 1956   BILIRUBINUR NEGATIVE 08/14/2019 1956   KETONESUR NEGATIVE 08/14/2019 1956   PROTEINUR NEGATIVE 08/14/2019 1956   UROBILINOGEN 1.0 08/14/2019 1824   NITRITE NEGATIVE 08/14/2019 1956   LEUKOCYTESUR NEGATIVE 08/14/2019 1956    Radiological Exams on Admission: No results found.  EKG: Independently reviewed.  Assessment/Plan Principal Problem:   Cellulitis of right lower leg Active Problems:   Insulin-requiring or dependent type II diabetes mellitus (HCC)   HTN (hypertension)   Chronic diastolic CHF (congestive heart failure) (HCC)   Chronic respiratory failure with hypoxia ([place])    1. Cellulitis of R leg  - 1. Failed outpt therapy with Keflex for 10 days 2. Cellulitis pathway 3. Therefore will admit and keep on zosyn / vanc for the moment, at least until we see some improvement 4. BCx pending 5. Repeat CBC/BMP in AM 2. IDDM2 - 1. Cont home 70/30 and glyburide 2. CBG checks AC/HS while here 3. HTN - 1. Cont home meds 4. ILD - 1. Cont home nebs 5. Takes chronic zoloft as a "PRN" "to help  her sleep" 1. Continue med QHS on a non PRN basis 2. Discussed with pt that this med shouldn't really be done PRN, should be taken on an "every night" type basis.  DVT prophylaxis: Lovenox Code Status: Full Family Communication: No family in room Disposition Plan: Home after cellulitis improved Consults called: None Admission status: Admit to inpatient  Severity of Illness: The appropriate patient status for this patient is INPATIENT. Inpatient status is judged to be reasonable and necessary in order to provide the required intensity of service to ensure the patient's safety. The patient's presenting symptoms, physical exam findings, and initial radiographic and laboratory data in the context of their chronic comorbidities is felt to place them at high risk for further clinical deterioration. Furthermore, it is not anticipated that the patient will be medically stable for discharge from the hospital within 2 midnights of admission. The following factors support the patient status of inpatient.   Patient has failed outpatient treatment with appropriate antibiotic therapy (10 days of keflex and the cellulitis is worse).  Therefore patient should qualify for inpatient status for IV abx.   * I certify that at the point of admission it is my clinical judgment that the patient will require inpatient hospital care spanning beyond 2 midnights from the point of admission due to high intensity of service, high risk for further deterioration and high frequency of surveillance required.*    Kedrick Mcnamee M.  DO [facility]  How to contact the [facility] Attending or Consulting provider [place] or covering provider during after hours [place], for this patient?  1. Check the care team in [facility] and look for a) attending/consulting [facility] provider listed and b) the [facility] team listed 2. Log into [email or web address]  Amion Physician Scheduling and messaging for groups and whole hospitals  On call and physician scheduling software for group practices, residents, hospitalists and other medical providers for call, clinic, rotation and shift schedules. On[employer] is a hospital-wide system for scheduling doctors and paging doctors on call. EasyPlot is for scientific plotting and data analysis.  [email or web address]  and use [place]'s universal password to access. If you do not have the password, please contact the hospital operator.  3. Locate the [facility] provider you are looking for under [facility] and page to a number that you can be directly reached. 4. If you still have difficulty reaching the provider, please page the [facility] (Director on Call) for the Hospitalists listed on amion for assistance.  11/09/2019, 5:40 AM

## 2019-11-09 NOTE — ED Notes (Signed)
Notified attending that pt is having vomiting despite having had zofran.

## 2019-11-09 NOTE — ED Notes (Signed)
Holding IVF at this time per attending. BP is stable.

## 2019-11-09 NOTE — Progress Notes (Signed)
Subjective: Patient admitted this morning, see detailed H&P by Dr Alcario Drought 69 year old female with medical history of hypertension, diabetes mellitus type 2, ILD with chronic oxygen requirement, chronic diastolic CHF came to ED with worsening erythema, edema of right lower extremity.  Patient said that she was started on Keflex as outpatient earlier this month despite taking 10 days her cellulitis has progressed and worsened.  She was seen at urgent care and was told to go to ED.  Vitals:   11/09/19 0630 11/09/19 0700  BP: 119/74 114/71  Pulse: 76 81  Resp: 20 23  Temp:    SpO2: 96% 98%      A/P    On exam-right lower extremity is warm to touch, mildly tender, significant edema in the right lower extremity  Plan Obtain venous duplex of right lower extremity to rule out DVT Continue vancomycin and Zosyn Continue diuresis with Lasix 40 mg daily Keep right lower extremity elevated   [place] Hospital[facility] [phone]

## 2019-11-09 NOTE — ED Notes (Signed)
Attending now at bedside. Placing order for cxr.

## 2019-11-09 NOTE — ED Notes (Addendum)
Paged attending and spoke with him as patient remains tachy after fluids. Pt to begin SS insulin.

## 2019-11-09 NOTE — Progress Notes (Signed)
VASCULAR LAB PRELIMINARY  PRELIMINARY  PRELIMINARY  PRELIMINARY   Right lower extremity venous duplex completed.    Preliminary report:  See Cv proc for preliminary results.  Erek Kowal, RVT 11/09/2019, 9:37 AM

## 2019-11-09 NOTE — ED Notes (Signed)
Pt. Transferred to CT

## 2019-11-09 NOTE — ED Notes (Signed)
Carb modified lunch tray ordered

## 2019-11-09 NOTE — ED Notes (Signed)
Called xray for stat ch xray.

## 2019-11-09 NOTE — ED Notes (Addendum)
Paged attending

## 2019-11-09 NOTE — ED Notes (Signed)
500 ml bolus restarted per attending.

## 2019-11-09 NOTE — Progress Notes (Addendum)
Called and updated patient's daughter Everlene Other.

## 2019-11-09 NOTE — ED Provider Notes (Signed)
[facility] EMERGENCY DEPARTMENT Provider Note   CSN: [id number] Arrival date & time: 11/08/19  1645     History Chief Complaint  Patient presents with  . Cellulitis    Kathy Irwin is a 69 y.o. female.  The history is provided by the patient and medical records.    69 y.o. F with hx of acute respiratory failure with hypoxia on chronic oxygen, diabetes, hypertension, presenting to the ED for cellulitis of her right lower leg.  States this started as a blister near her right ankle where there was some "weeping" of the leg.  She was seen by PCP and started on keflex which she has been taking since 10/24/19.  States redness has worsened and has begun spreading up to her right thigh now.  She has had intermittent fevers, temp reached 100.59F here in waiting room.  States leg feels tight but she denies any focal numbness or weakness.  She has not had any nausea or vomiting.  She was seen at urgent care today and sent here for IV antibiotics and admission.  Past Medical History:  Diagnosis Date  . Acute respiratory failure with hypoxia ([place]) 12/2018  . Diabetes mellitus without complication ([place])   . 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 Hypertension   . ILD (interstitial lung disease) ([place]) 12/24/2018    Patient Active Problem List   Diagnosis Date Noted  . Acute on chronic diastolic CHF (congestive heart failure) ([place]) 08/02/2019  . Chronic respiratory failure with hypoxia ([place]) 08/02/2019  . Acute on chronic respiratory failure with hypoxemia ([place]) 08/02/2019  . Atherosclerosis of aorta ([place]) 01/23/2019  . Interstitial lung disease ([place]) 12/24/2018  . Obesity, Class III, BMI 40-49.9 (morbid obesity) ([place]) 12/24/2018  . Pneumonia due to COVID-19 virus 11/15/2018  . 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 Thrombocytopenia ([place]) 12/19/2017  . Insulin-requiring or dependent type II diabetes mellitus ([place]) 12/17/2017  . HTN (hypertension) 12/17/2017    . Essential hypertension 11/05/2017  . 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 ABDOMINAL HYSTERECTOMY    . VIDEO BRONCHOSCOPY Bilateral 04/02/2019   Procedure: VIDEO BRONCHOSCOPY WITH FLUORO;  Surgeon: Marshell Garfinkel, MD;  Location: [facility];  Service: Cardiopulmonary;  Laterality: Bilateral;     OB History   No obstetric history on file.     Family History  Problem Relation Age of Onset  . Diabetes Other   . Hypertension Other     Social History   Tobacco Use  . Smoking status: Never Smoker  . Smokeless tobacco: Never Used  Vaping Use  . Vaping Use: Never used  Substance Use Topics  . Alcohol use: Never  . Drug use: Never    Home Medications Prior to Admission medications   Medication Sig Start Date End Date Taking? Authorizing Provider  acetaminophen (TYLENOL) 500 MG tablet Take 1,000 mg by mouth 2 (two) times daily as needed (for pain).    [provider]  albuterol (VENTOLIN HFA) 108 (90 Base) MCG/ACT inhaler Inhale 1-2 puffs into the lungs every 6 (six) hours as needed for wheezing or shortness of breath. 07/24/19   Wieters, Hallie C, PA-C  Azelastine-Fluticasone (DYMISTA) 137-50 MCG/ACT SUSP Place 1-2 sprays into both nostrils daily as needed (for allergies). 05/15/19   Kerin Perna, NP  blood glucose meter kit and supplies KIT Dispense based on patient and insurance preference. Use up to four times daily as directed. (FOR ICD-9 250.00, 250.01). For  QAC - HS accuchecks. 11/22/18   Thurnell Lose, MD  [employer] D3 125 MCG (5000 UT) capsule Take 5,000 Units by mouth daily. 07/16/19   [provider]  dextromethorphan 15 MG/5ML syrup Take 10 mLs (30 mg total) by mouth 4 (four) times daily as needed for cough. 08/12/19   Dessa Phi, DO  diphenhydrAMINE (BENADRYL) 25 MG tablet Take 1 tablet (25 mg total) by mouth at bedtime as needed for itching. 09/02/19   Kerin Perna, NP   doxycycline (VIBRA-TABS) 100 MG tablet Take 1 tablet (100 mg total) by mouth 2 (two) times daily. 11/07/19   Kerin Perna, NP  fluticasone (FLONASE) 50 MCG/ACT nasal spray Place 2 sprays into both nostrils daily as needed for allergies or rhinitis.  07/15/19   [provider]  furosemide (LASIX) 40 MG tablet Take 40 mg by mouth.    [provider]  glimepiride (AMARYL) 4 MG tablet Take 1 tablet (4 mg total) by mouth daily with breakfast. 09/02/19   Kerin Perna, NP  glucose blood (FREESTYLE LITE) test strip For glucose testing every before meals at bedtime. Diagnosis E 11.65  Can substitute to any accepted brand 11/22/18   Thurnell Lose, MD  HYDROcodone-acetaminophen (NORCO) 5-325 MG tablet Take 1 tablet by mouth every 6 (six) hours as needed for moderate pain. 09/05/19   Brunetta Genera, MD  Insulin Lispro Prot & Lispro (HUMALOG MIX 75/25 KWIKPEN) (75-25) 100 UNIT/ML Kwikpen Inject 80 Units into the skin See admin instructions. Inject 50 units into the skin before breakfast "and a second dose at bedtime 30 units Patient taking differently: Inject 30-50 Units into the skin See admin instructions. Inject 50 units into the skin before breakfast and a second dose at bedtime 30 units 05/15/19   Kerin Perna, NP  levocetirizine (XYZAL) 5 MG tablet Take 1 tablet by mouth daily as needed for allergies.  07/15/19   [provider]  metFORMIN (GLUCOPHAGE) 1000 MG tablet Take 1,000 mg by mouth 2 (two) times daily with a meal. Patient not taking: Reported on 11/04/2019    [provider]  metolazone (ZAROXOLYN) 2.5 MG tablet Take 2.5 mg by mouth three times a week, take 30 minutes before taking lasix 05/06/19   Troy Sine, MD  metoprolol (TOPROL-XL) 200 MG 24 hr tablet Take 1 tablet (200 mg total) by mouth daily. 09/02/19   Kerin Perna, NP  montelukast (SINGULAIR) 10 MG tablet Take 10 mg by mouth at bedtime.  05/31/19   [provider]   olopatadine (PATANOL) 0.1 % ophthalmic solution Place 1 drop into both eyes 2 (two) times daily. Patient taking differently: Place 1 drop into both eyes 2 (two) times daily as needed for allergies.  07/24/19   Wieters, Hallie C, PA-C  omeprazole (PRILOSEC) 20 MG capsule Take 1 capsule (20 mg total) by mouth daily. Patient taking differently: Take 20 mg by mouth daily as needed (GERD).  12/26/18   Hongalgi, Lenis Dickinson, MD  ondansetron (ZOFRAN) 4 MG tablet Take 1 tablet (4 mg total) by mouth every 8 (eight) hours as needed for nausea or vomiting. 08/15/19   Rancour, Annie Main, MD  pravastatin (PRAVACHOL) 40 MG tablet Take 40 mg by mouth at bedtime.    [provider]  pregabalin (LYRICA) 50 MG capsule Take 1 capsule (50 mg total) by mouth 3 (three) times daily. 09/02/19   Kerin Perna, NP  sertraline (ZOLOFT) 100 MG tablet Take 100 mg by mouth  at bedtime.    [provider]  sodium chloride (OCEAN) 0.65 % SOLN nasal spray Place 1 spray into both nostrils as needed for congestion. 08/12/19   Dessa Phi, DO  sulfamethoxazole-trimethoprim (BACTRIM DS) 800-160 MG tablet Take 1 tablet by mouth 2 (two) times daily for 7 days. 11/08/19 11/15/19  Faustino Congress, NP    Allergies    Other, Iodine, Merbromin, and Tape  Review of Systems   Review of Systems  Skin: Positive for color change.  All other systems reviewed and are negative.   Physical Exam Updated Vital Signs BP (!) 123/64 (BP Location: Right Arm)   Pulse 84   Temp 100.3 F (37.9 C) (Oral)   Resp 17   SpO2 100%   Physical Exam Vitals and nursing note reviewed.  Constitutional:      Appearance: She is well-developed. She is not diaphoretic.  HENT:     Head: Normocephalic and atraumatic.  Eyes:     Conjunctiva/sclera: Conjunctivae normal.     Pupils: Pupils are equal, round, and reactive to light.  Cardiovascular:     Rate and Rhythm: Normal rate and regular rhythm.     Heart sounds: Normal heart sounds.   Pulmonary:     Effort: Pulmonary effort is normal. No respiratory distress.     Breath sounds: Normal breath sounds. No rhonchi.  Abdominal:     General: Bowel sounds are normal.     Palpations: Abdomen is soft.     Tenderness: There is no abdominal tenderness. There is no rebound.  Musculoskeletal:        General: Normal range of motion.     Cervical back: Normal range of motion.     Comments: Area of skin avulsion noted to right lower lateral shin; there are some small blisters noted along mid-shin, there is cellulitis extending from ankle up to posterior thigh; leg is warm to the touch, indurated; no tissue crepitus noted DP pulse intact   Skin:    General: Skin is warm and dry.  Neurological:     Mental Status: She is alert and oriented to person, place, and time.       ED Results / Procedures / Treatments   Labs (all labs ordered are listed, but only abnormal results are displayed) Labs Reviewed  COMPREHENSIVE METABOLIC PANEL - Abnormal; Notable for the following components:      Result Value   Glucose, Bld 115 (*)    Total Bilirubin 2.2 (*)    All other components within normal limits  CBC WITH DIFFERENTIAL/PLATELET - Abnormal; Notable for the following components:   MCV 101.2 (*)    RDW 19.6 (*)    Platelets 112 (*)    nRBC 2.7 (*)    Monocytes Absolute 1.8 (*)    Abs Immature Granulocytes 0.11 (*)    All other components within normal limits  CULTURE, BLOOD (ROUTINE X 2)  CULTURE, BLOOD (ROUTINE X 2)  SARS CORONAVIRUS 2 BY RT PCR (HOSPITAL ORDER, [place] [facility])  LACTIC ACID, PLASMA  URINALYSIS, ROUTINE W REFLEX MICROSCOPIC    EKG None  Radiology No results found.  Procedures Procedures (including critical care time)  Medications Ordered in ED Medications  sodium chloride flush (NS) 0.9 % injection 3 mL (has no administration in time range)  piperacillin-tazobactam (ZOSYN) IVPB 3.375 g (3.375 g Intravenous New Bag/Given 11/09/19  0529)  vancomycin (VANCOREADY) IVPB 2000 mg/400 mL (has no administration in time range)  vancomycin (VANCOREADY) IVPB 750  mg/150 mL (has no administration in time range)  HYDROcodone-acetaminophen (NORCO/VICODIN) 5-325 MG per tablet 1-2 tablet (1 tablet Oral Given 11/09/19 0529)  piperacillin-tazobactam (ZOSYN) IVPB 3.375 g (has no administration in time range)  enoxaparin (LOVENOX) injection 40 mg (has no administration in time range)  acetaminophen (TYLENOL) tablet 650 mg (has no administration in time range)    Or  acetaminophen (TYLENOL) suppository 650 mg (has no administration in time range)  ondansetron (ZOFRAN) tablet 4 mg (has no administration in time range)    Or  ondansetron (ZOFRAN) injection 4 mg (has no administration in time range)    ED Course  I have reviewed the triage vital signs and the nursing notes.  Pertinent labs & imaging results that were available during my care of the patient were reviewed by me and considered in my medical decision making (see chart for details).    MDM Rules/Calculators/A&P  69 y.o. F here with worsening cellulitis or right leg despite OP antibiotic therapy with keflex since 10/24/19.  Erythema, induration, and warmth now extending from right ankle up to right posterior thigh.  No tissue crepitus.  She is febrile here but overall non-toxic.  Labs reassuring-- normal lactate and WBC count.  She is on her baseline 2L O2 without cough or SOB.  At this point, patient has failed OP therapy.  Will admit for IV abx.  Patient agreeable.  Discussed with hospitalist, Dr. Alcario Drought-- will admit for ongoing care.  Final Clinical Impression(s) / ED Diagnoses Final diagnoses:  Cellulitis of right lower extremity    Rx / DC Orders ED Discharge Orders    None       Larene Pickett, PA-C 11/09/19 0543    Ripley Fraise, MD 11/09/19 [phone]

## 2019-11-09 NOTE — ED Notes (Signed)
Paging attending again to report change in status and request he see pt.

## 2019-11-09 NOTE — ED Provider Notes (Signed)
Patient seen/examined in the Emergency Department in conjunction with [facility] Provider [place] Patient reports right leg pain/redness with worsening infection Exam : pt with cellulitis of right LE.  Distal pulse intact.  No crepitus Plan: admit for IV antibiotics          Ripley Fraise, MD 11/09/19 [phone]

## 2019-11-09 NOTE — ED Notes (Signed)
Patient transported to CT by this RN and CT tech.

## 2019-11-09 NOTE — ED Notes (Addendum)
Requested order for water to be added to oxygen. As pt uses that at home. Pt has bsc.

## 2019-11-09 NOTE — ED Notes (Signed)
Called CT.

## 2019-11-09 NOTE — ED Notes (Signed)
Insulin order not active. On coming RN will follow up with provider.

## 2019-11-09 NOTE — Progress Notes (Addendum)
Pharmacy Antibiotic Note  Shawn Dannenberg is a 69 y.o. female admitted on 11/08/2019 with cellulitis.  Pharmacy has been consulted for vancomycin and zosyn dosing.  Plan: Vancomycin 2gm IV x 1 then 750 mg IV q12 hours Zosyn 3.375 gm IV q8 hours F/u renal function, cultures and clinical course    Temp (24hrs), Avg:98.9 F (37.2 C), Min:97.4 F (36.3 C), Max:100.3 F (37.9 C)  Recent Labs  Lab 11/08/19 1705  WBC 9.9  CREATININE 0.66  LATICACIDVEN 1.6    Estimated Creatinine Clearance: 82.8 mL/min (by C-G formula based on SCr of 0.66 mg/dL).    Allergies  Allergen Reactions  . Other Shortness Of Breath and Other (See Comments)    Newspaper ink =  new chest pain, also  . Iodine Other (See Comments)    "Was a long time ago" (Reaction??)  . Merbromin Other (See Comments)    Mercurochrome- "Was a long time ago" (Reaction??)  . Tape    Thank you for allowing pharmacy to be a part of this patient's care.  Excell Seltzer Poteet 11/09/2019 4:46 AM

## 2019-11-09 NOTE — ED Notes (Signed)
Called lab to add on A1C.

## 2019-11-09 NOTE — Progress Notes (Addendum)
Called by RN that patient tachycardic, vomited x3, no improvement with Zofran.  Zofran was changed to IV Phenergan.  Came down to see patient in the ED.  She appears lethargic, denies shortness of breath.  Though she is requiring 5 L of oxygen via nasal cannula. On exam chest is clear to auscultation bilaterally She is tachycardic with heart rate 130s  Assessment Intractable nausea and vomiting, likely secondary to IV antibiotics. Change Zofran to IV Phenergan Stat chest x-ray portable to rule out aspiration pneumonia 500 cc normal saline bolus IV x1.  Time spent 35 minutes

## 2019-11-10 DIAGNOSIS — L03115 Cellulitis of right lower limb: Secondary | ICD-10-CM

## 2019-11-10 DIAGNOSIS — G9340 Encephalopathy, unspecified: Secondary | ICD-10-CM

## 2019-11-10 LAB — CBC
HCT: 35 % — ABNORMAL LOW (ref 36.0–46.0)
HCT: 38.1 % (ref 36.0–46.0)
Hemoglobin: 10.8 g/dL — ABNORMAL LOW (ref 12.0–15.0)
Hemoglobin: 11.5 g/dL — ABNORMAL LOW (ref 12.0–15.0)
MCH: 31.4 pg (ref 26.0–34.0)
MCH: 31.3 pg (ref 26.0–34.0)
MCHC: 30.9 g/dL (ref 30.0–36.0)
MCHC: 30.2 g/dL (ref 30.0–36.0)
MCV: 101.7 fL — ABNORMAL HIGH (ref 80.0–100.0)
MCV: 103.5 fL — ABNORMAL HIGH (ref 80.0–100.0)
Platelets: 74 10*3/uL — ABNORMAL LOW (ref 150–400)
Platelets: 68 10*3/uL — ABNORMAL LOW (ref 150–400)
RBC: 3.44 MIL/uL — ABNORMAL LOW (ref 3.87–5.11)
RBC: 3.68 MIL/uL — ABNORMAL LOW (ref 3.87–5.11)
RDW: 19.2 % — ABNORMAL HIGH (ref 11.5–15.5)
RDW: 19.1 % — ABNORMAL HIGH (ref 11.5–15.5)
WBC: 5.4 10*3/uL (ref 4.0–10.5)
WBC: 4.5 10*3/uL (ref 4.0–10.5)
nRBC: 1.5 % — ABNORMAL HIGH (ref 0.0–0.2)
nRBC: 1.8 % — ABNORMAL HIGH (ref 0.0–0.2)

## 2019-11-10 LAB — BLOOD GAS, ARTERIAL
Acid-Base Excess: 0.8 mmol/L (ref 0.0–2.0)
Bicarbonate: 26.8 mmol/L (ref 20.0–28.0)
Drawn by: 44166
FIO2: 32
O2 Saturation: 96.2 %
Patient temperature: 37.7
pCO2 arterial: 60.2 mmHg — ABNORMAL HIGH (ref 32.0–48.0)
pH, Arterial: 7.275 — ABNORMAL LOW (ref 7.350–7.450)
pO2, Arterial: 96.4 mmHg (ref 83.0–108.0)

## 2019-11-10 LAB — GLUCOSE, CAPILLARY
Glucose-Capillary: 109 mg/dL — ABNORMAL HIGH (ref 70–99)
Glucose-Capillary: 131 mg/dL — ABNORMAL HIGH (ref 70–99)
Glucose-Capillary: 149 mg/dL — ABNORMAL HIGH (ref 70–99)
Glucose-Capillary: 175 mg/dL — ABNORMAL HIGH (ref 70–99)
Glucose-Capillary: 181 mg/dL — ABNORMAL HIGH (ref 70–99)
Glucose-Capillary: 192 mg/dL — ABNORMAL HIGH (ref 70–99)

## 2019-11-10 LAB — BASIC METABOLIC PANEL
Anion gap: 10 (ref 5–15)
BUN: 10 mg/dL (ref 8–23)
CO2: 22 mmol/L (ref 22–32)
Calcium: 8.6 mg/dL — ABNORMAL LOW (ref 8.9–10.3)
Chloride: 109 mmol/L (ref 98–111)
Creatinine, Ser: 0.88 mg/dL (ref 0.44–1.00)
GFR calc Af Amer: >60 mL/min (ref >60)
GFR calc non Af Amer: >60 mL/min (ref >60)
Glucose, Bld: 202 mg/dL — ABNORMAL HIGH (ref 70–99)
Potassium: 3.7 mmol/L (ref 3.5–5.1)
Sodium: 141 mmol/L (ref 135–145)

## 2019-11-10 LAB — CK TOTAL AND CKMB (NOT AT ARMC)
CK, MB: 5.9 ng/mL — ABNORMAL HIGH (ref 0.5–5.0)
Relative Index: INVALID (ref 0.0–2.5)
Total CK: 78 U/L (ref 38–234)

## 2019-11-10 LAB — COMPREHENSIVE METABOLIC PANEL
ALT: 18 U/L (ref 0–44)
AST: 27 U/L (ref 15–41)
Albumin: 3.2 g/dL — ABNORMAL LOW (ref 3.5–5.0)
Alkaline Phosphatase: 39 U/L (ref 38–126)
Anion gap: 7 (ref 5–15)
BUN: 12 mg/dL (ref 8–23)
CO2: 26 mmol/L (ref 22–32)
Calcium: 8.9 mg/dL (ref 8.9–10.3)
Chloride: 108 mmol/L (ref 98–111)
Creatinine, Ser: 0.69 mg/dL (ref 0.44–1.00)
GFR calc Af Amer: >60 mL/min (ref >60)
GFR calc non Af Amer: >60 mL/min (ref >60)
Glucose, Bld: 137 mg/dL — ABNORMAL HIGH (ref 70–99)
Potassium: 4 mmol/L (ref 3.5–5.1)
Sodium: 141 mmol/L (ref 135–145)
Total Bilirubin: 1.9 mg/dL — ABNORMAL HIGH (ref 0.3–1.2)
Total Protein: 6.2 g/dL — ABNORMAL LOW (ref 6.5–8.1)

## 2019-11-10 LAB — MAGNESIUM: Magnesium: 2 mg/dL (ref 1.7–2.4)

## 2019-11-10 LAB — D-DIMER, QUANTITATIVE
D-Dimer, Quant: 0.35 ug/mL-FEU (ref 0.00–0.50)
D-Dimer, Quant: 0.42 ug/mL-FEU (ref 0.00–0.50)

## 2019-11-10 LAB — LACTIC ACID, PLASMA
Lactic Acid, Venous: 1.4 mmol/L (ref 0.5–1.9)
Lactic Acid, Venous: 1.1 mmol/L (ref 0.5–1.9)
Lactic Acid, Venous: 1.5 mmol/L (ref 0.5–1.9)

## 2019-11-10 LAB — PHOSPHORUS: Phosphorus: 2.1 mg/dL — ABNORMAL LOW (ref 2.5–4.6)

## 2019-11-10 LAB — PROTIME-INR
INR: 1.3 — ABNORMAL HIGH (ref 0.8–1.2)
Prothrombin Time: 16.1 seconds — ABNORMAL HIGH (ref 11.4–15.2)

## 2019-11-10 LAB — MRSA PCR SCREENING: MRSA by PCR: NEGATIVE

## 2019-11-10 LAB — GLUCOSE, RANDOM: Glucose, Bld: 139 mg/dL — ABNORMAL HIGH (ref 70–99)

## 2019-11-10 LAB — PROCALCITONIN: Procalcitonin: 0.23 ng/mL

## 2019-11-10 MED ORDER — KETOROLAC TROMETHAMINE 15 MG/ML IJ SOLN
15.0000 mg | Freq: Once | INTRAMUSCULAR | Status: AC
  Administered 2019-11-10: 15 mg via INTRAVENOUS
  Filled 2019-11-10: qty 1

## 2019-11-10 MED ORDER — DEXTROSE IN LACTATED RINGERS 5 % IV SOLN: INTRAVENOUS | Status: DC

## 2019-11-10 MED ORDER — SODIUM CHLORIDE 0.9 % IV BOLUS
250.0000 mL | Freq: Once | INTRAVENOUS | Status: AC | PRN
  Administered 2019-11-11: 250 mL via INTRAVENOUS

## 2019-11-10 MED ORDER — LORAZEPAM 2 MG/ML IJ SOLN: 0.7500 mg | INTRAMUSCULAR | Status: DC | PRN

## 2019-11-10 MED ORDER — LACTATED RINGERS IV BOLUS
1000.0000 mL | Freq: Once | INTRAVENOUS | Status: AC
  Administered 2019-11-10: 1000 mL via INTRAVENOUS

## 2019-11-10 MED ORDER — SODIUM PHOSPHATES 45 MMOLE/15ML IV SOLN
30.0000 mmol | Freq: Once | INTRAVENOUS | Status: AC
  Administered 2019-11-11: 30 mmol via INTRAVENOUS
  Filled 2019-11-10: qty 10

## 2019-11-10 MED ORDER — SODIUM CHLORIDE 0.9 % IV BOLUS: 250.0000 mL | Freq: Once | INTRAVENOUS | Status: DC

## 2019-11-10 MED ORDER — LORAZEPAM 2 MG/ML IJ SOLN: 1.0000 mg | Freq: Once | INTRAMUSCULAR | Status: DC

## 2019-11-10 MED ORDER — LACTATED RINGERS IV BOLUS
500.0000 mL | Freq: Once | INTRAVENOUS | Status: AC
  Administered 2019-11-10: 500 mL via INTRAVENOUS

## 2019-11-10 MED ORDER — IBUPROFEN 200 MG PO TABS
600.0000 mg | ORAL_TABLET | Freq: Once | ORAL | Status: AC
  Administered 2019-11-10: 600 mg via ORAL
  Filled 2019-11-10: qty 3

## 2019-11-10 NOTE — Progress Notes (Signed)
   11/10/19 1900  Vitals  BP (!) 171/76  MAP (mmHg) 103  Pulse Rate (!) 121  ECG Heart Rate (!) 120  Resp (!) 28  Oxygen Therapy  SpO2 100 %  MEWS Score  MEWS Temp 0  MEWS Systolic 0  MEWS Pulse 2  MEWS RR 2  MEWS LOC 1  MEWS Score 5  MEWS Score Color Red     MD (Lama) messaged due to pts increased HR and RR. PO metoprolol ineffective. Pt with increased RR and increased confusion. Awaiting new orders.

## 2019-11-10 NOTE — Consult Note (Signed)
NAME:  Kathy Irwin, MRN:  [id number], DOB:  Feb 23, 1951, LOS: 1 ADMISSION DATE:  11/08/2019, CONSULTATION DATE:  11/10/19 REFERRING MD:  Triad MD, CHIEF COMPLAINT:  Acute encephaolpathy in setting of sepsis, ILD and phenergan   Brief History   See below  History of present illness    69 year old female with obesity, chronic diastolic failure, ITP, chronic hypoxemic respiratory failure on 3 L nasal cannula at baseline secondary to interstitial lung disease followed by Dr. Vaughan Browner in the ILD clinic at pulmonary clinic.  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  History of COVID-19 in September 2020   She has interstitial lung disease likely since 2012 treated in [place].  Transbronchial biopsy in 2012 showed mild chronic interstitial inflammation without any granuloma.  Subsequently evaluated at [facility] in [place] in 2013 where she responded to prednisone.  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  She has trace positive rheumatoid factor at 52.  History of rheumatology evaluation without specific evidence for rheumatoid arthritis not otherwise specified time..  Chronic oxygen therapy for 3 L since at least 2019 following respiratory failure admission in September 2019.  Relocated to [facility] in July 2020 to be near the children.  Hospitalized with COVID-19 in September 2020 to [facility].  Improved and went back to baseline 3 L nasal cannula.  Followed with a bronchoscopy and lavage in December 2020 and transbronchial biopsies without evidence of granuloma but cell count was 92% lymphocytes consistent with chronic hypersensitive pneumonitis.  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  Etiology for hypersensitive pneumonitis felt to be more than her previous house.  Most recent visit with Dr. Vaughan Browner in May 2021 and recommendation was to observe off prednisone.  Issue was pedal edema and was being treated  with diuretics.  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  Consideration was made for IVIG if platelets drop below 50 K   She presented on November 09, 2019 with history of worsening erythema and edema of the right lower extremity below the knee and the distal half.  This was despite outpatient antibiotic therapy with cephalexin for 10 days.  She presented with worsening symptoms.  She also had vomiting and nausea.  She was given Zofran but this did not help.  She was then given Phenergan x1 dose after which she became drowsy.  CT head was unremarkable.  On November 10, 2019 despite antibiotic she is continued to remain drowsy.  Despite antibiotics his fever to 102.6 Fahrenheit.  Family was concerned and requested critical care medicine consult with potential transfer request to intensive care unit.  Hemodynamically stable and requiring 3-4 L of nasal cannula similar to baseline.  Family is also concerned that the right lower extremity cellulitis has not improved  Past Medical History     has a past medical history of Acute respiratory failure with hypoxia ([place]) (12/2018), Diabetes mellitus without complication ([place]), Hypersensitivity pneumonitis ([place]) (12/19/2017), Hypertension, and ILD (interstitial lung disease) ([place]) (12/24/2018).   reports that she has never smoked. She has never used smokeless tobacco.  Past Surgical History:  Procedure Laterality Date  . ABDOMINAL HYSTERECTOMY    . VIDEO BRONCHOSCOPY Bilateral 04/02/2019   Procedure: VIDEO BRONCHOSCOPY WITH FLUORO;  Surgeon: Marshell Garfinkel, MD;  Location: [facility];  Service: Cardiopulmonary;  Laterality: Bilateral;    Allergies  Allergen Reactions  .  Other Shortness Of Breath and Other (See Comments)    Newspaper ink =  new chest pain, also  . Iodine Other (See Comments)    "Was a long time ago" (Reaction??)  . Merbromin  Other (See Comments)    Mercurochrome- "Was a long time ago" (Reaction??)  . Tape     Immunization History  Administered Date(s) Administered  . Fluad Quad(high Dose 65+) 01/23/2019  . PPD Test 09/08/2019    Family History  Problem Relation Age of Onset  . Diabetes Other   . Hypertension Other      Current Facility-Administered Medications:  .  acetaminophen (TYLENOL) tablet 650 mg, 650 mg, Oral, Q6H PRN, 650 mg at 11/10/19 0008 **OR** acetaminophen (TYLENOL) suppository 650 mg, 650 mg, Rectal, Q6H PRN, Etta Quill, DO, 650 mg at 11/10/19 1950 .  albuterol (PROVENTIL) (2.5 MG/3ML) 0.083% nebulizer solution 3 mL, 3 mL, Inhalation, Q6H PRN, Etta Quill, DO .  dextrose 5 % in lactated ringers infusion, , Intravenous, Continuous, Selyna Klahn, MD .  enoxaparin (LOVENOX) injection 40 mg, 40 mg, Subcutaneous, Q24H, Gardner, Jared M, DO, 40 mg at 11/10/19 0551 .  famotidine (PEPCID) IVPB 20 mg premix, 20 mg, Intravenous, Q12H, Oswald Hillock, MD, Stopped at 11/10/19 1135 .  fluticasone (FLONASE) 50 MCG/ACT nasal spray 2 spray, 2 spray, Each Nare, Daily PRN, Jennette Kettle M, DO .  insulin aspart (novoLOG) injection 0-9 Units, 0-9 Units, Subcutaneous, TID WC, Oswald Hillock, MD, 1 Units at 11/10/19 1723 .  lactated ringers bolus 500 mL, 500 mL, Intravenous, Once, Brand Males, MD .  metoprolol succinate (TOPROL-XL) 24 hr tablet 200 mg, 200 mg, Oral, Daily, Alcario Drought, Jared M, DO, 200 mg at 11/10/19 1722 .  olopatadine (PATANOL) 0.1 % ophthalmic solution 1 drop, 1 drop, Both Eyes, BID PRN, Alcario Drought, Jared M, DO .  piperacillin-tazobactam (ZOSYN) IVPB 3.375 g, 3.375 g, Intravenous, Q8H, Ripley Fraise, MD, Last Rate: 12.5 mL/hr at 11/10/19 1627, Rate Verify at 11/10/19 1627 .  pravastatin (PRAVACHOL) tablet 40 mg, 40 mg, Oral, QHS, Gardner, Jared M, DO, 40 mg at 11/10/19 0011 .  sertraline (ZOLOFT) tablet 100 mg, 100 mg, Oral, QHS, Gardner, Jared M, DO, 100 mg at 11/10/19  0009 .  sodium chloride 0.9 % bolus 250 mL, 250 mL, Intravenous, Once PRN, Chotiner, Yevonne Aline, MD .  sodium chloride flush (NS) 0.9 % injection 3 mL, 3 mL, Intravenous, Once, Ripley Fraise, MD .  vancomycin (VANCOREADY) IVPB 750 mg/150 mL, 750 mg, Intravenous, Q12H, Ripley Fraise, MD, Last Rate: 150 mL/hr at 11/10/19 1627, 750 mg at 11/10/19 1627   Significant Hospital Events   11/09/2019/admit  Consults:  11/10/2019-critical care medicine consult  Procedures:  x  Significant Diagnostic Tests:  x  Micro Data:  7/2 5 - MRSA PCR - neg 7/25- covid - neg    Antimicrobials:  vanc 7/25 -  Zosyn 7/25 -   Interim history/subjective:  7/26 - seen at bedside [id number]-[id number]  Objective   Blood pressure 114/72, pulse (!) 117, temperature (!) 102.6 F (39.2 C), temperature source Rectal, resp. rate (!) 38, SpO2 100 %.        Intake/Output Summary (Last 24 hours) at 11/10/2019 2058 Last data filed at 11/10/2019 1627 Gross per 24 hour  Intake 645.45 ml  Output 700 ml  Net -54.55 ml   There were no vitals filed for this visit.  Examination: General: obese ladey in bed on o2 wiuthout distress HENT: on o2. No elevated JVP or  neck nodes Lungs: Mildly tachypneic but not paradoxcial. No acc muscle use. Pulse ox 100% on 4L [place] Cardiovascular: Tachy. Sinus Abdomen: soft, obese, non tender Extremities: RLE warm with marking. Edema +. No crepitus. 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 - Dr Concepcion Living - ITP - Dr Irene Limbo - Obesity with chronic d-CHF - not on chronic steroids   Currently  Acute sepsis syndrome from RLE cellulitis Acute obtunded encephalopathy since zofran and phenergan in setting of sepsis  (also received vicodin x 1 and lyrica x 1)  - protecting airway  Worsening thrombocytopenia due to above   Plan  - vanc and  zosyn to continue - if no improvement might need abx change - dc orders for vicodin (got  X 1) and ativan (not gotten so far) - fluid bolus x 1 - D5LR to continue  - stat labs - stat ABG - HRCT cjest - IV Ig if platelets drop < 50K or steroids - need to discuss with heme at that point - Might need MRI/CT - RLE depending on course If  Best practice:  Diet: npo Pain/Anxiety/Delirium protocol (if indicated): dc benzo and opipoids VAP protocol (if indicated): x DVT prophylaxis: scd GI prophylaxis: per triad Glucose control: ssi per triad Mobility: bed rest per triad Code Status: full code Family Communication: daughters x 2 at bedside Disposition: full code. KEeop in 4NP- > if worse got to ICU     ATTESTATION & SIGNATURE   The patient Jayel Scaduto is critically ill with multiple organ systems failure and requires high complexity decision making for assessment and support, frequent evaluation and titration of therapies, application of advanced monitoring technologies and extensive interpretation of multiple databases.   Critical Care Time devoted to patient care services described in this note is  60  Minutes. This time reflects time of care of this signee Dr Brand Males. This critical care time does not reflect procedure time, or teaching time or supervisory time of PA/NP/Med student/Med Resident etc but could involve care discussion time     Dr. Brand Males, M.D., [facility].C.P Pulmonary and Critical Care Medicine Staff Physician [facility] Pager: [phone], If no answer or between  15:00h - 7:00h: call [phone]  11/10/2019 8:58 PM     LABS    PULMONARY No results for input(s): PHART, PCO2ART, PO2ART, HCO3, TCO2, O2SAT in the last 168 hours.  Invalid input(s): PCO2, PO2  CBC Recent Labs  Lab 11/08/19 1705 11/10/19 0545  HGB 13.5 10.8*  HCT 43.6 35.0*  WBC 9.9 5.4  PLT 112* 74*    COAGULATION No  results for input(s): INR in the last 168 hours.  CARDIAC  No results for input(s): TROPONINI in the last 168 hours. No results for input(s): PROBNP in the last 168 hours.   CHEMISTRY Recent Labs  Lab 11/08/19 1705 11/10/19 0545  NA 143 141  K 3.8 3.7  CL 106 109  CO2 27 22  GLUCOSE 115* 202*  BUN 8 10  CREATININE 0.66 0.88  CALCIUM 9.9 8.6*   Estimated Creatinine Clearance: 75.2 mL/min (by C-G formula based on SCr of 0.88 mg/dL).   LIVER Recent Labs  Lab 11/08/19 1705  AST 27  ALT 17  ALKPHOS 64  BILITOT  2.2*  PROT 7.0  ALBUMIN 4.1     INFECTIOUS Recent Labs  Lab 11/08/19 1705 11/10/19 0545 11/10/19 1051  LATICACIDVEN 1.6 1.4 1.1     ENDOCRINE CBG (last 3)  Recent Labs    11/10/19 1208 11/10/19 1659 11/10/19 2015  GLUCAP 149* 131* 109*         IMAGING x48h  - image(s) personally visualized  -   highlighted in bold CT HEAD WO CONTRAST  Result Date: 11/09/2019 CLINICAL DATA:  69 year old female with history of mental status change. EXAM: CT HEAD WITHOUT CONTRAST TECHNIQUE: Contiguous axial images were obtained from the base of the skull through the vertex without intravenous contrast. COMPARISON:  No priors. FINDINGS: Brain: Physiologic calcifications in the basal ganglia bilaterally. Patchy areas of very mild decreased attenuation are noted throughout the deep and periventricular white matter of the cerebral hemispheres bilaterally, compatible with mild chronic microvascular ischemic disease. No evidence of acute infarction, hemorrhage, hydrocephalus, extra-axial collection or mass lesion/mass effect. Vascular: No hyperdense vessel or unexpected calcification. Skull: Normal. Negative for fracture or focal lesion. Sinuses/Orbits: No acute finding. Other: None. IMPRESSION: 1. No acute intracranial abnormalities. 2. Very mild chronic microvascular ischemic changes in the cerebral white matter, as above. Electronically Signed   By: Vinnie Langton M.D.    On: 11/09/2019 16:30   CT ABDOMEN PELVIS W CONTRAST  Result Date: 11/09/2019 CLINICAL DATA:  Nausea, vomiting EXAM: CT ABDOMEN AND PELVIS WITH CONTRAST TECHNIQUE: Multidetector CT imaging of the abdomen and pelvis was performed using the standard protocol following bolus administration of intravenous contrast. CONTRAST:  170m OMNIPAQUE IOHEXOL 300 MG/ML  SOLN COMPARISON:  08/15/2019, 02/11/2019 FINDINGS: Lower chest: Motion artifact limits evaluation of the lung bases, however, areas of interstitial thickening, ground-glass infiltrate, and traction bronchiolectasis are again identified, better evaluated on prior CT examination of 02/11/2019. the visualized heart and pericardium are unremarkable. Hepatobiliary: Porcelain gallbladder. Multiple gallstones seen within the gallbladder neck. No intra or extrahepatic biliary ductal dilation. Liver unremarkable. Pancreas: Unremarkable Spleen: Unremarkable Adrenals/Urinary Tract: The adrenal glands are unremarkable. The kidneys are unremarkable. The bladder is unremarkable. Stomach/Bowel: The stomach, small bowel, and large bowel are unremarkable. Appendix normal. No free intraperitoneal gas or fluid. Vascular/Lymphatic: There is no pathologic adenopathy within the abdomen and pelvis. The abdominal vasculature is age-appropriate. Reproductive: Uterus absent. Residual ovaries are unremarkable. No adnexal masses. Other: Moderate fat containing umbilical hernia is present. Rectum unremarkable. Musculoskeletal: Degenerative changes are noted within the lumbar spine and hips. No acute bone abnormality. IMPRESSION: No radiographic explanation for the patient's reported symptoms. Incidental findings as noted above. Electronically Signed   By: AFidela SalisburyMD   On: 11/09/2019 21:45   DG Chest Portable 1 View  Result Date: 11/09/2019 CLINICAL DATA:  69year old female with possible aspiration. EXAM: PORTABLE CHEST 1 VIEW COMPARISON:  Chest radiograph dated 08/11/2019 and  CT dated 08/15/2019. FINDINGS: Diffuse bilateral interstitial densities and bronchiectatic changes with greater involvement of the lower lung fields as seen on the prior CT and radiograph in keeping with chronic interstitial lung disease. Overall slight interval increase in pulmonary densities since the prior radiograph which may be related to poor inspiratory effort. Superimposed infiltrate is not entirely excluded. Clinical correlation is recommended. No lobar consolidation, large pleural effusion or pneumothorax. Stable cardiomediastinal silhouette. No acute osseous pathology. IMPRESSION: 1. Chronic interstitial lung disease.  No focal consolidation. 2. Slight interval increase in the pulmonary densities may be related to poor inspiratory effort. Superimposed infiltrate is not entirely excluded. Clinical correlation  is recommended. Electronically Signed   By: Anner Crete M.D.   On: 11/09/2019 15:43   VAS [place] LOWER EXTREMITY VENOUS (DVT)  Result Date: 11/09/2019  Lower Venous DVTStudy Indications: Swelling, and cellulitis, failed outpatient antibiotics.  Risk Factors: Venous insufficiency. Limitations: Body habitus and edema. Comparison Study: Prior study from 07/17/19 is available for comparison Performing Technologist: Sharion Dove RVS  Examination Guidelines: A complete evaluation includes B-mode imaging, spectral Doppler, color Doppler, and power Doppler as needed of all accessible portions of each vessel. Bilateral testing is considered an integral part of a complete examination. Limited examinations for reoccurring indications may be performed as noted. The reflux portion of the exam is performed with the patient in reverse Trendelenburg.  +---------+---------------+---------+-----------+----------+--------------+ RIGHT    CompressibilityPhasicitySpontaneityPropertiesThrombus Aging +---------+---------------+---------+-----------+----------+--------------+ CFV      Full           Yes       Yes                                 +---------+---------------+---------+-----------+----------+--------------+ SFJ      Full                                                        +---------+---------------+---------+-----------+----------+--------------+ FV Prox  Full                                                        +---------+---------------+---------+-----------+----------+--------------+ FV Mid   Full                                                        +---------+---------------+---------+-----------+----------+--------------+ FV DistalFull                                                        +---------+---------------+---------+-----------+----------+--------------+ PFV      Full                                                        +---------+---------------+---------+-----------+----------+--------------+ POP      Full           Yes      Yes                                 +---------+---------------+---------+-----------+----------+--------------+ PTV      Full                                                        +---------+---------------+---------+-----------+----------+--------------+  PERO                                                  Not visualized +---------+---------------+---------+-----------+----------+--------------+ GSV      Full                                                        +---------+---------------+---------+-----------+----------+--------------+ SSV      Full                                                        +---------+---------------+---------+-----------+----------+--------------+   +----+---------------+---------+-----------+----------+--------------+ LEFTCompressibilityPhasicitySpontaneityPropertiesThrombus Aging +----+---------------+---------+-----------+----------+--------------+ CFV Full           Yes      Yes                                  +----+---------------+---------+-----------+----------+--------------+     Summary: RIGHT: - No evidence of deep vein thrombosis in the lower extremity. No indirect evidence of obstruction proximal to the inguinal ligament. - Findings appear essentially unchanged compared to previous examination. interstitial edema throughout calf  LEFT: - No evidence of common femoral vein obstruction.  *See table(s) above for measurements and observations. Electronically signed by Monica Martinez MD on 11/09/2019 at 11:39:11 AM.    Final

## 2019-11-10 NOTE — Progress Notes (Signed)
11/10/19 1800  Vitals  BP (!) 160/96  MAP (mmHg) 115  Pulse Rate (!) 111  ECG Heart Rate (!) 114  Resp (!) 27  Oxygen Therapy  SpO2 100 %  MEWS Score  MEWS Temp 0  MEWS Systolic 0  MEWS Pulse 2  MEWS RR 2  MEWS LOC 1  MEWS Score 5  MEWS Score Color Red    Pt with increased HR and BP. Pt passed swallow eval, will attempt to give PO metoporol.

## 2019-11-10 NOTE — Evaluation (Signed)
Clinical/Bedside Swallow Evaluation Patient Details  Name: Kathy Irwin MRN: [id number] Date of Birth: February 21, 1951  Today's Date: 11/10/2019 Time: SLP Start Time (ACUTE ONLY): 1700 SLP Stop Time (ACUTE ONLY): 1712 SLP Time Calculation (min) (ACUTE ONLY): 12 min  Past Medical History:  Past Medical History:  Diagnosis Date  . Acute respiratory failure with hypoxia ([place]) 12/2018  . Diabetes mellitus without complication ([place])   . Hypersensitivity pneumonitis ([place]) 12/19/2017  . Hypertension   . ILD (interstitial lung disease) ([place]) 12/24/2018   Past Surgical History:  Past Surgical History:  Procedure Laterality Date  . ABDOMINAL HYSTERECTOMY    . VIDEO BRONCHOSCOPY Bilateral 04/02/2019   Procedure: VIDEO BRONCHOSCOPY WITH FLUORO;  Surgeon: Marshell Garfinkel, MD;  Location: [facility];  Service: Cardiopulmonary;  Laterality: Bilateral;   HPI:  69 y.o. female with medical history significant for HTN, DM2, ILD with chronic O2 requirement, diastolic CHF. Pt presents to ED with a couple of weeks of worsening erythema, edema of RLE - dx sepsis. After admission with N/V, tachycardia.  Hypotensive episode early hours of 7/26, RR called, labs normal.  Head CT normal.  Now with AMS, likely metabolic encephalopathy.    Assessment / Plan / Recommendation Clinical Impression  Pt with limited participation in bedside swallow assessment.  She was lethargic, awakened to state her name, follow one command, otherwise maintained her eyes closed and did not follow other commands, did not participate in oral mechanism exam.  She accepted half teaspoon water from a spoon multiple times, several ice chips, and one spoon of applesauce; she was unable to draw water from a straw.  There were no overt s/s of aspiration, adequate lip seal/control, but unable to assess in full given her lethargy.  Recommend initiating a full liquid diet for tonight - only feed when alert, and hold tray if coughing. Give meds whole in  puree.  SLP will f/u for improvements/readiness to advance diet. D/W RN.  SLP Visit Diagnosis: Dysphagia, oropharyngeal phase (R13.12)    Aspiration Risk       Diet Recommendation   full liquids  Medication Administration: Whole meds with puree    Other  Recommendations Oral Care Recommendations: Oral care BID   Follow up Recommendations Other (comment) (tba)      Frequency and Duration min 2x/week  1 week       Prognosis Prognosis for Safe Diet Advancement: Good      Swallow Study   General Date of Onset: 11/09/19 HPI: 69 y.o. female with medical history significant for HTN, DM2, ILD with chronic O2 requirement, diastolic CHF. Pt presents to ED with a couple of weeks of worsening erythema, edema of RLE - dx sepsis. After admission with N/V, tachycardia.  Hypotensive episode early hours of 7/26, RR called, labs normal.  Head CT normal.  Now with AMS, likely metabolic encephalopathy.  Type of Study: Bedside Swallow Evaluation Previous Swallow Assessment: no Diet Prior to this Study: NPO Temperature Spikes Noted: No Respiratory Status: Nasal cannula History of Recent Intubation: No Behavior/Cognition: Lethargic/Drowsy Oral Cavity Assessment:  (difficult to assess) Oral Care Completed by SLP: No Self-Feeding Abilities: Total assist Patient Positioning: Upright in bed Baseline Vocal Quality: Normal Volitional Cough: Cognitively unable to elicit Volitional Swallow: Unable to elicit    Oral/Motor/Sensory Function Overall Oral Motor/Sensory Function: Other (comment) (following minimal commands; tongue extension midline)   Ice Chips Ice chips: Within functional limits Presentation: Spoon   Thin Liquid Thin Liquid: Within functional limits    Nectar Thick Nectar Thick  Liquid: Not tested   Honey Thick Honey Thick Liquid: Not tested   Puree Puree: Within functional limits Presentation: Spoon   Solid     Solid: Not tested      [name] Laurice 11/10/2019,5:23  PM   Estill Bamberg L. Tivis Ringer, [place] Office number [phone] Pager [phone]

## 2019-11-10 NOTE — Progress Notes (Addendum)
Patient watch and necklace taken home by daughter Phill Mutter.

## 2019-11-10 NOTE — Progress Notes (Signed)
eLink Physician-Brief Progress Note Patient Name: Kathy Irwin DOB: [birth year] MRN: [id number]   Date of Service  11/10/2019  HPI/Events of Note  Hypercarbic Respiratory Failure - Dr. Chase Caller wants to try BiPAP.  eICU Interventions  Plan: 1. BiPAP - IPAP = 12 and EPAP = 5. Setting adjustments per RT. 2. ABG at 12:45 AM.     Intervention Category Major Interventions: Other:;Respiratory failure - evaluation and management  Hollis Tuller Cornelia Copa 11/10/2019, 11:39 PM

## 2019-11-10 NOTE — Significant Event (Signed)
Rapid Response Event Note  Overview: Asked to see pt as a second set of eyes per family request. Family is concerned about mental status and would like pt moved to the ICU.    Initial Focused Assessment: Pt laying in bed with eyes closed. Pt will arouse to repeated verbal stimulation. She is alert x 1, follows commands with repeated verbal requests, and moves all extremities. Pupils 4 and sluggish. Lungs diminished t/o. Skin hot to touch. Urine via purewick is amber colored.  T-102.6, HR-110-120s, BP-154/87, RR-25, SpO2-100% on NRB, CBG-109.  FiO2 titrated back down to 3L (baseline from home) with SpO2-99%.   Interventions: Dr. Tonie Griffith to bedside, PCCM consulted  500cc LR bolus Labs-CK/CKMB, LA, Pct, PT, D-dimer, CMP/Mg/Phos, CBC, ABG High resolution chest CT Urine-strep penumoniae/legionella 12 L EKG D5LR @ 75cc/hr  Plan of Care (if not transferred): I saw pt multiple times last night. She may be a little more lethargic, however overall, her mental status is unchanged. Spoke with family regarding this and the effects sepsis can have. I spoke with them regarding the interventions we have in place and the new ones that are being initiated(PCCM consult, labs, etc). After this conversation, they feel more comfortable about the situation. Once PCCM arrived, saw pt,  and spoke with them about the plan of care(leave on PCU with close monitoring, labs, CT, etc.) they feel a lot more comfortable.  RN to initiate new interventions and continue to monitor closely. Please call RRT if further assistance needed.   Event Summary:  Dr. Tonie Griffith notified and to bedside at 2005. PCCM to bedside at 2055 Called: 1933 Arrived: 2003 Ended: 2100  Dillard Essex

## 2019-11-10 NOTE — Progress Notes (Signed)
Called to assess patient.  Dr. Olevia Bowens was called and he contacted me to evaluate patient. Pt admitted with Sepsis and Right lower extremity cellulitis.  Patient has been more lethargic and tachycardic.  Has temperature to 102.5 degrees.  Given Tylenol. Patient's been placed on nonrebreather due to desaturation.  Pressure 155-160/75-90.  Tachycardia with heart rate and 115-125 range.  Patient tachypneic. Lactic acid was 1.1. Pt is more lethargic than this am per Rapid Response RN at bedside.  Ordered STAT blood glucose.  Family wants pt moved to ICU.  Discussed with PCCM, Dr Oletta Darter, and PCCM will see in consult.

## 2019-11-10 NOTE — Significant Event (Addendum)
Rapid Response Event Note  Overview: Called d/t AMS, tachycardia, and tachypnea.  Pt here for RLE cellulitis.   Initial Focused Assessment: Pt laying in bed with eyes closed. Pt will open eyes to command. Pt is very irritable and confused. She is oriented to self only. She will follow commands and move all extremities equally. Pupils 4 and brisk. Lungs clear, diminished in bases. Skin hot to touch. T-101.3, HR-119, BP-116/56, RR-30, SpO2-96% on 3L [place].   Interventions: Tylenol(already ordered) Plan of Care (if not transferred): Treat fever and continue to monitor. Call RRT if further assistance needed.   0243-Pt temp-103.1 after tylenol. MD notified by RN and order for ibuprofen.  0324-SpO2-70s after given pt ibuprofen. ?? Asp??. Pt placed on NRB with SpO2 increasing to 100%. Lungs still clear, diminished in bases. Pt neuro status unchanged. Pt FiO2 able to be titrated back down to 4L [place] with SpO2-94%. Keep pt NPO.  0436-SBP-80s-250cc NS bolus ordered. SBP up to 90s during bolus.   Event Summary:  Dr. Tonie Griffith notified by bedside RN   Called: 2348 Arrived: 2355 Ended:0005   Dillard Essex

## 2019-11-10 NOTE — Progress Notes (Signed)
[facility]  PROGRESS NOTE  Kathy Irwin GEZ:[id number] DOB: 07-Dec-1950 DOA: 11/08/2019 PCP: Kerin Perna, NP   Brief HPI:   *69 year old female with medical history of hypertension, diabetes mellitus type 2, ILD with chronic oxygen requirement, chronic diastolic CHF came to ED with worsening erythema, edema of right lower extremity.  Patient said that she was started on Keflex as outpatient earlier this month despite taking 10 days her cellulitis has progressed and worsened.  She was seen at urgent care and was told to go to ED. in the ED patient was started on vancomycin and Zosyn.  Yesterday afternoon patient became nauseous and started vomiting.  Zofran was given but did not improve nausea vomiting.  Then patient was given Phenergan and after that she became drowsy.  Patient was tachycardic, chest x-ray did not show aspiration pneumonia.  Later CT abdomen/pelvis was obtained which was unremarkable.  Patient was transferred to stepdown, later became hypotensive.  Rapid response was called however all the labs were unremarkable.  Patient was following commands.  CT head also was obtained which was negative for acute abnormality.     Subjective   This morning patient is alert, answering questions well, following commands.  Though still mildly confused.   Assessment/Plan:     1. Sepsis due to right lower extremity cellulitis-patient presented with sepsis physiology, currently resolved.  We will continue with vancomycin and Zosyn.  Repeat lactic acid this morning is 1.1.  Follow blood culture results. 2. Altered mental status-likely metabolic encephalopathy from underlying sepsis, dehydration, drug-induced from Phenergan.  Slowly improving.  Avoid sedatives.  CT head is unremarkable. 3. Right lower extremity cellulitis-patient started on Keflex as outpatient, presented with failed outpatient therapy.  Has been started on vancomycin and Zosyn as above.  She is currently improving with  antibiotic treatment.  Follow blood culture results.  Right lower extremity venous duplex was negative for DVT. 4. Intractable vomiting-resolved, patient had multiple episodes of vomiting yesterday, likely from antibiotics.  CT abdomen/pelvis was obtained which was unremarkable.  Vomiting is currently resolved.  Patient was kept n.p.o. continue Zofran as needed for nausea vomiting.  Will discontinue Phenergan as it made patient somnolent.   5. Diabetes mellitus type 2-we will hold insulin 70/30 and glipizide, continue sliding scale insulin with NovoLog.  CBGs are controlled. 6. Hypertension-continue home medication including metoprolol 200 mg daily 7. Interstitial lung disease-continue oxygen via nasal cannula, as needed nebulizer treatment.   Scheduled medications:   . Chlorhexidine Gluconate Cloth  6 each Topical Once   And  . Chlorhexidine Gluconate Cloth  6 each Topical Once  . enoxaparin (LOVENOX) injection  40 mg Subcutaneous Q24H  . insulin aspart  0-9 Units Subcutaneous TID WC  . metoprolol  200 mg Oral Daily  . pravastatin  40 mg Oral QHS  . sertraline  100 mg Oral QHS  . sodium chloride flush  3 mL Intravenous Once         CBG: Recent Labs  Lab 11/09/19 1959 11/09/19 2157 11/10/19 0015 11/10/19 0426 11/10/19 0808  GLUCAP 162* 159* 175* 192* 181*    SpO2: 99 % O2 Flow Rate (L/min): 4 L/min    CBC: Recent Labs  Lab 11/08/19 1705 11/10/19 0545  WBC 9.9 5.4  NEUTROABS 6.2  --   HGB 13.5 10.8*  HCT 43.6 35.0*  MCV 101.2* 101.7*  PLT 112* 74*    Basic Metabolic Panel: Recent Labs  Lab 11/08/19 1705 11/10/19 0545  NA 143 141  K  3.8 3.7  CL 106 109  CO2 27 22  GLUCOSE 115* 202*  BUN 8 10  CREATININE 0.66 0.88  CALCIUM 9.9 8.6*     Liver Function Tests: Recent Labs  Lab 11/08/19 1705  AST 27  ALT 17  ALKPHOS 64  BILITOT 2.2*  PROT 7.0  ALBUMIN 4.1     Antibiotics: Anti-infectives (From admission, onward)   Start     Dose/Rate Route  Frequency Ordered Stop   11/10/19 0600  ceFAZolin (ANCEF) IVPB 2g/100 mL premix     Discontinue     2 g 200 mL/hr over 30 Minutes Intravenous On call to O.R. 11/09/19 2328 11/11/19 0559   11/09/19 1700  vancomycin (VANCOREADY) IVPB 750 mg/150 mL     Discontinue     750 mg 150 mL/hr over 60 Minutes Intravenous Every 12 hours 11/09/19 0445     11/09/19 1300  piperacillin-tazobactam (ZOSYN) IVPB 3.375 g     Discontinue     3.375 g 12.5 mL/hr over 240 Minutes Intravenous Every 8 hours 11/09/19 0520     11/09/19 0445  piperacillin-tazobactam (ZOSYN) IVPB 3.375 g        3.375 g 100 mL/hr over 30 Minutes Intravenous  Once 11/09/19 0442 11/09/19 0559   11/09/19 0445  vancomycin (VANCOREADY) IVPB 2000 mg/400 mL        2,000 mg 200 mL/hr over 120 Minutes Intravenous  Once 11/09/19 0444 11/09/19 0859       DVT prophylaxis: Lovenox  Code Status: Full code  Family Communication: No family at bedside    Status is: Inpatient  Dispo: The patient is from: Home              Anticipated d/c is to: Home versus skilled nursing facility              Anticipated d/c date is: 11/12/2019              Patient currently not medically stable for discharge  Barrier to discharge-ongoing treatment for sepsis due to cellulitis          Consultants:    Procedures:     Objective   Vitals:   11/10/19 0531 11/10/19 0808 11/10/19 0900 11/10/19 0930  BP: (!) 98/58 (!) 85/53 102/73 104/67  Pulse: 98 85 85 85  Resp: (!) 34 22 (!) 25 21  Temp:  97.9 F (36.6 C) 98.3 F (36.8 C)   TempSrc:  Oral Oral   SpO2: 94% 96% 100% 99%    Intake/Output Summary (Last 24 hours) at 11/10/2019 1302 Last data filed at 11/09/2019 1913 Gross per 24 hour  Intake 108.33 ml  Output --  Net 108.33 ml    07/24 1901 - 07/26 0700 In: 108.3  Out: -   There were no vitals filed for this visit.  Physical Examination:    General: Appears in no acute distress  Cardiovascular: S1-S2, regular, no murmur  auscultated  Respiratory: Clear to auscultation bilaterally, no wheezing or crackles auscultated  Abdomen: Soft, nontender, no organomegaly  Extremities: Right lower extremity swelling has reduced, mild erythema, mild tenderness to palpation  Neurologic: Alert, oriented x2, no focal deficit, moving all extremities    Data Reviewed:   Recent Results (from the past 240 hour(s))  Blood culture (routine x 2)     Status: None (Preliminary result)   Collection Time: 11/09/19  5:00 AM   Specimen: BLOOD  Result Value Ref Range Status   Specimen Description BLOOD LEFT ANTECUBITAL  Final   Special Requests   Final    BOTTLES DRAWN AEROBIC AND ANAEROBIC Blood Culture adequate volume   Culture   Final    NO GROWTH 1 DAY Performed at [facility], [place] [address], [place], [place] [address]    Report Status PENDING  Incomplete  Blood culture (routine x 2)     Status: None (Preliminary result)   Collection Time: 11/09/19  5:00 AM   Specimen: BLOOD  Result Value Ref Range Status   Specimen Description BLOOD RIGHT ANTECUBITAL  Final   Special Requests   Final    BOTTLES DRAWN AEROBIC AND ANAEROBIC Blood Culture adequate volume   Culture   Final    NO GROWTH 1 DAY Performed at [facility], [place] [address], [place], [place] [address]    Report Status PENDING  Incomplete  SARS Coronavirus 2 by RT PCR (hospital order, performed in [facility] hospital lab) Nasopharyngeal Nasopharyngeal Swab     Status: None   Collection Time: 11/09/19  5:32 AM   Specimen: Nasopharyngeal Swab  Result Value Ref Range Status   SARS Coronavirus 2 NEGATIVE NEGATIVE Final    Comment: (NOTE) SARS-CoV-2 target nucleic acids are NOT DETECTED.  The SARS-CoV-2 RNA is generally detectable in upper and lower respiratory specimens during the acute phase of infection. The lowest concentration of SARS-CoV-2 viral copies this assay can detect is 250 copies / mL. A negative result does not preclude SARS-CoV-2  infection and should not be used as the sole basis for treatment or other patient management decisions.  A negative result may occur with improper specimen collection / handling, submission of specimen other than nasopharyngeal swab, presence of viral mutation(s) within the areas targeted by this assay, and inadequate number of viral copies (<250 copies / mL). A negative result must be combined with clinical observations, patient history, and epidemiological information.  Fact Sheet for Patients:   StrictlyIdeas.no  Fact Sheet for Healthcare Providers: BankingDealers.co.za  This test is not yet approved or  cleared by the [place] FDA and has been authorized for detection and/or diagnosis of SARS-CoV-2 by FDA under an Emergency Use Authorization (EUA).  This EUA will remain in effect (meaning this test can be used) for the duration of the COVID-19 declaration under Section 564(b)(1) of the Act, 21 U.S.C. section 360bbb-3(b)(1), unless the authorization is terminated or revoked sooner.  Performed at [facility], [place] [address], [place], [place] [address]   MRSA PCR Screening     Status: None   Collection Time: 11/10/19  5:45 AM   Specimen: Nasal Mucosa; Nasopharyngeal  Result Value Ref Range Status   MRSA by PCR NEGATIVE NEGATIVE Final    Comment:        The GeneXpert MRSA Assay (FDA approved for NASAL specimens only), is one component of a comprehensive MRSA colonization surveillance program. It is not intended to diagnose MRSA infection nor to guide or monitor treatment for MRSA infections. Performed at [facility], [place] [address] [place], [place] [address]     No results for input(s): LIPASE, AMYLASE in the last 168 hours. No results for input(s): AMMONIA in the last 168 hours.  Cardiac Enzymes: No results for input(s): CKTOTAL, CKMB, CKMBINDEX, TROPONINI in the last 168 hours. BNP (last 3  results) Recent Labs    12/23/18 1446 08/01/19 1429 10/24/19 1924  BNP 21.0 93.4 38.0    ProBNP (last 3 results) Recent Labs    03/03/19  1151  PROBNP 70.0    Studies:  CT HEAD WO CONTRAST  Result Date: 11/09/2019 CLINICAL DATA:  69 year old female with history of mental status change. EXAM: CT HEAD WITHOUT CONTRAST TECHNIQUE: Contiguous axial images were obtained from the base of the skull through the vertex without intravenous contrast. COMPARISON:  No priors. FINDINGS: Brain: Physiologic calcifications in the basal ganglia bilaterally. Patchy areas of very mild decreased attenuation are noted throughout the deep and periventricular white matter of the cerebral hemispheres bilaterally, compatible with mild chronic microvascular ischemic disease. No evidence of acute infarction, hemorrhage, hydrocephalus, extra-axial collection or mass lesion/mass effect. Vascular: No hyperdense vessel or unexpected calcification. Skull: Normal. Negative for fracture or focal lesion. Sinuses/Orbits: No acute finding. Other: None. IMPRESSION: 1. No acute intracranial abnormalities. 2. Very mild chronic microvascular ischemic changes in the cerebral white matter, as above. Electronically Signed   By: Vinnie Langton M.D.   On: 11/09/2019 16:30   CT ABDOMEN PELVIS W CONTRAST  Result Date: 11/09/2019 CLINICAL DATA:  Nausea, vomiting EXAM: CT ABDOMEN AND PELVIS WITH CONTRAST TECHNIQUE: Multidetector CT imaging of the abdomen and pelvis was performed using the standard protocol following bolus administration of intravenous contrast. CONTRAST:  115m OMNIPAQUE IOHEXOL 300 MG/ML  SOLN COMPARISON:  08/15/2019, 02/11/2019 FINDINGS: Lower chest: Motion artifact limits evaluation of the lung bases, however, areas of interstitial thickening, ground-glass infiltrate, and traction bronchiolectasis are again identified, better evaluated on prior CT examination of 02/11/2019. the visualized heart and pericardium are  unremarkable. Hepatobiliary: Porcelain gallbladder. Multiple gallstones seen within the gallbladder neck. No intra or extrahepatic biliary ductal dilation. Liver unremarkable. Pancreas: Unremarkable Spleen: Unremarkable Adrenals/Urinary Tract: The adrenal glands are unremarkable. The kidneys are unremarkable. The bladder is unremarkable. Stomach/Bowel: The stomach, small bowel, and large bowel are unremarkable. Appendix normal. No free intraperitoneal gas or fluid. Vascular/Lymphatic: There is no pathologic adenopathy within the abdomen and pelvis. The abdominal vasculature is age-appropriate. Reproductive: Uterus absent. Residual ovaries are unremarkable. No adnexal masses. Other: Moderate fat containing umbilical hernia is present. Rectum unremarkable. Musculoskeletal: Degenerative changes are noted within the lumbar spine and hips. No acute bone abnormality. IMPRESSION: No radiographic explanation for the patient's reported symptoms. Incidental findings as noted above. Electronically Signed   By: AFidela SalisburyMD   On: 11/09/2019 21:45   DG Chest Portable 1 View  Result Date: 11/09/2019 CLINICAL DATA:  69year old female with possible aspiration. EXAM: PORTABLE CHEST 1 VIEW COMPARISON:  Chest radiograph dated 08/11/2019 and CT dated 08/15/2019. FINDINGS: Diffuse bilateral interstitial densities and bronchiectatic changes with greater involvement of the lower lung fields as seen on the prior CT and radiograph in keeping with chronic interstitial lung disease. Overall slight interval increase in pulmonary densities since the prior radiograph which may be related to poor inspiratory effort. Superimposed infiltrate is not entirely excluded. Clinical correlation is recommended. No lobar consolidation, large pleural effusion or pneumothorax. Stable cardiomediastinal silhouette. No acute osseous pathology. IMPRESSION: 1. Chronic interstitial lung disease.  No focal consolidation. 2. Slight interval increase in the  pulmonary densities may be related to poor inspiratory effort. Superimposed infiltrate is not entirely excluded. Clinical correlation is recommended. Electronically Signed   By: AAnner CreteM.D.   On: 11/09/2019 15:43   VAS [place]OWER EXTREMITY VENOUS (DVT)  Result Date: 11/09/2019  Lower Venous DVTStudy Indications: Swelling, and cellulitis, failed outpatient antibiotics.  Risk Factors: Venous insufficiency. Limitations: Body habitus and edema. Comparison Study: Prior study from 07/17/19 is available for comparison Performing Technologist: CSharion DoveRVS  Examination Guidelines: A complete evaluation includes B-mode imaging, spectral Doppler, color Doppler, and power Doppler as needed of all accessible portions of each vessel. Bilateral testing is considered an integral part of a complete examination. Limited examinations for reoccurring indications may be performed as noted. The reflux portion of the exam is performed with the patient in reverse Trendelenburg.  +---------+---------------+---------+-----------+----------+--------------+ RIGHT    CompressibilityPhasicitySpontaneityPropertiesThrombus Aging +---------+---------------+---------+-----------+----------+--------------+ CFV      Full           Yes      Yes                                 +---------+---------------+---------+-----------+----------+--------------+ SFJ      Full                                                        +---------+---------------+---------+-----------+----------+--------------+ FV Prox  Full                                                        +---------+---------------+---------+-----------+----------+--------------+ FV Mid   Full                                                        +---------+---------------+---------+-----------+----------+--------------+ FV DistalFull                                                         +---------+---------------+---------+-----------+----------+--------------+ PFV      Full                                                        +---------+---------------+---------+-----------+----------+--------------+ POP      Full           Yes      Yes                                 +---------+---------------+---------+-----------+----------+--------------+ PTV      Full                                                        +---------+---------------+---------+-----------+----------+--------------+ PERO  Not visualized +---------+---------------+---------+-----------+----------+--------------+ GSV      Full                                                        +---------+---------------+---------+-----------+----------+--------------+ SSV      Full                                                        +---------+---------------+---------+-----------+----------+--------------+   +----+---------------+---------+-----------+----------+--------------+ LEFTCompressibilityPhasicitySpontaneityPropertiesThrombus Aging +----+---------------+---------+-----------+----------+--------------+ CFV Full           Yes      Yes                                 +----+---------------+---------+-----------+----------+--------------+     Summary: RIGHT: - No evidence of deep vein thrombosis in the lower extremity. No indirect evidence of obstruction proximal to the inguinal ligament. - Findings appear essentially unchanged compared to previous examination. interstitial edema throughout calf  LEFT: - No evidence of common femoral vein obstruction.  *See table(s) above for measurements and observations. Electronically signed by Monica Martinez MD on 11/09/2019 at 11:39:11 AM.    Final        Oswald Hillock   [facility] If 7PM-7AM, please contact night-coverage at [email or web address], Office   [phone]   11/10/2019, 1:02 PM  LOS: 1 day

## 2019-11-10 NOTE — Progress Notes (Signed)
[facility] Progress Note Patient Name: Kathy Irwin DOB: May 22, 1950 MRN: [id number]   Date of Service  11/10/2019  HPI/Events of Note  ABG on  O2 - 7.275/60.2/96.4/26.8  eICU Interventions  Will notify ground team of ABG results.      Intervention Category Major Interventions: Acid-Base disturbance - evaluation and management  Star Cheese Eugene 11/10/2019, 11:09 PM

## 2019-11-10 NOTE — Progress Notes (Signed)
Patient is placed on NIV per MD Ramaswamy orders. Patient is an acute hypercarbic respiratory failure. Patient is unresponsive to verbal stimuli. Patient O2 saturations are stable at this time.

## 2019-11-10 NOTE — Progress Notes (Signed)
Patient arrived to the unit at 2345. Vitals obtained. Patient tachycardic, tachypneic, febrile, and AOx1. RN communicated patient's status to MD.Tylenol administered, Zosyn hung, and other nightly meds given. Skin assessed with no abnormal findings other than a large skin-tag-like bump found directly below the right buttocks and RLE cellulitis. Sacral foam placed, bed alarm set, call bell within reach, and bed set in the lowest position.

## 2019-11-10 NOTE — Progress Notes (Addendum)
After taking ibuprofen, patient began to desaturate as low at the 70s. Rapid notified and nonrebreather mask placed. Rapid arrived to assess the patient and we weaned the patient back onto the nasal cannula while maintaining adequate saturations. MD notified of situation.  Patient's BP began to progressively trend down at 0400, getting as low as 80s/40s. Rapid response called. 250m NS bolus administered with no improvement. MD then ordered 10063mbolus of LR, which is running now. MD came to assess patient. Stat labs including CBC, BMP, lactic acid, and D dimer ordered. Phlebotomy at bedside now.

## 2019-11-10 NOTE — Progress Notes (Signed)
[facility] night shift.  The staff reports that the patient is tachycardic, hyperventilating and very talkative.  She is disoriented and does not know that she is in the hospital.  She had a similar picture last night, but was febrile.  She is not febrile tonight but seems to be delirious again.  Lorazepam 1 mg IVP x1 dose now and 0.75 mg IVP after 4 hours as needed.  Monitor vital signs.  Tennis Must, MD.

## 2019-11-10 NOTE — Progress Notes (Signed)
[facility] Progress Note Patient Name: Arrionna Serena DOB: [birth year] MRN: [id number]   Date of Service  11/10/2019  HPI/Events of Note  Hypophosphatemia - PO4--- = 2.1 and Creatinine = 0.69.   eICU Interventions  Plan: 1. Replace PO4---.     Intervention Category Major Interventions: Electrolyte abnormality - evaluation and management  Kawan Valladolid Cornelia Copa 11/10/2019, 11:37 PM

## 2019-11-11 ENCOUNTER — Inpatient Hospital Stay (HOSPITAL_COMMUNITY): Payer: Medicare Other

## 2019-11-11 DIAGNOSIS — J9622 Acute and chronic respiratory failure with hypercapnia: Secondary | ICD-10-CM

## 2019-11-11 DIAGNOSIS — J9621 Acute and chronic respiratory failure with hypoxia: Secondary | ICD-10-CM

## 2019-11-11 LAB — POCT I-STAT 7, (LYTES, BLD GAS, ICA,H+H)
Acid-Base Excess: 3 mmol/L — ABNORMAL HIGH (ref 0.0–2.0)
Acid-Base Excess: 5 mmol/L — ABNORMAL HIGH (ref 0.0–2.0)
Bicarbonate: 31.7 mmol/L — ABNORMAL HIGH (ref 20.0–28.0)
Bicarbonate: 32.2 mmol/L — ABNORMAL HIGH (ref 20.0–28.0)
Calcium, Ion: 1.33 mmol/L (ref 1.15–1.40)
Calcium, Ion: 1.32 mmol/L (ref 1.15–1.40)
HCT: 35 % — ABNORMAL LOW (ref 36.0–46.0)
HCT: 37 % (ref 36.0–46.0)
Hemoglobin: 11.9 g/dL — ABNORMAL LOW (ref 12.0–15.0)
Hemoglobin: 12.6 g/dL (ref 12.0–15.0)
O2 Saturation: 96 %
O2 Saturation: 100 %
Patient temperature: 98.2
Potassium: 4 mmol/L (ref 3.5–5.1)
Potassium: 4.1 mmol/L (ref 3.5–5.1)
Sodium: 145 mmol/L (ref 135–145)
Sodium: 146 mmol/L — ABNORMAL HIGH (ref 135–145)
TCO2: 34 mmol/L — ABNORMAL HIGH (ref 22–32)
TCO2: 34 mmol/L — ABNORMAL HIGH (ref 22–32)
pCO2 arterial: 68.6 mmHg (ref 32.0–48.0)
pCO2 arterial: 59.6 mmHg — ABNORMAL HIGH (ref 32.0–48.0)
pH, Arterial: 7.271 — ABNORMAL LOW (ref 7.350–7.450)
pH, Arterial: 7.341 — ABNORMAL LOW (ref 7.350–7.450)
pO2, Arterial: 96 mmHg (ref 83.0–108.0)
pO2, Arterial: 271 mmHg — ABNORMAL HIGH (ref 83.0–108.0)

## 2019-11-11 LAB — COMPREHENSIVE METABOLIC PANEL
ALT: 18 U/L (ref 0–44)
AST: 26 U/L (ref 15–41)
Albumin: 3 g/dL — ABNORMAL LOW (ref 3.5–5.0)
Alkaline Phosphatase: 39 U/L (ref 38–126)
Anion gap: 6 (ref 5–15)
BUN: 10 mg/dL (ref 8–23)
CO2: 27 mmol/L (ref 22–32)
Calcium: 8.8 mg/dL — ABNORMAL LOW (ref 8.9–10.3)
Chloride: 108 mmol/L (ref 98–111)
Creatinine, Ser: 0.65 mg/dL (ref 0.44–1.00)
GFR calc Af Amer: >60 mL/min (ref >60)
GFR calc non Af Amer: >60 mL/min (ref >60)
Glucose, Bld: 148 mg/dL — ABNORMAL HIGH (ref 70–99)
Potassium: 4.2 mmol/L (ref 3.5–5.1)
Sodium: 141 mmol/L (ref 135–145)
Total Bilirubin: 1.9 mg/dL — ABNORMAL HIGH (ref 0.3–1.2)
Total Protein: 5.9 g/dL — ABNORMAL LOW (ref 6.5–8.1)

## 2019-11-11 LAB — BLOOD GAS, ARTERIAL
Acid-Base Excess: 1.6 mmol/L (ref 0.0–2.0)
Bicarbonate: 27.9 mmol/L (ref 20.0–28.0)
Drawn by: 44166
FIO2: 40
O2 Saturation: 98.5 %
Patient temperature: 37
pCO2 arterial: 63.9 mmHg — ABNORMAL HIGH (ref 32.0–48.0)
pH, Arterial: 7.264 — ABNORMAL LOW (ref 7.350–7.450)
pO2, Arterial: 147 mmHg — ABNORMAL HIGH (ref 83.0–108.0)

## 2019-11-11 LAB — MAGNESIUM
Magnesium: 2.2 mg/dL (ref 1.7–2.4)
Magnesium: 1.9 mg/dL (ref 1.7–2.4)

## 2019-11-11 LAB — GLUCOSE, CAPILLARY
Glucose-Capillary: 114 mg/dL — ABNORMAL HIGH (ref 70–99)
Glucose-Capillary: 117 mg/dL — ABNORMAL HIGH (ref 70–99)
Glucose-Capillary: 134 mg/dL — ABNORMAL HIGH (ref 70–99)
Glucose-Capillary: 139 mg/dL — ABNORMAL HIGH (ref 70–99)
Glucose-Capillary: 168 mg/dL — ABNORMAL HIGH (ref 70–99)
Glucose-Capillary: 211 mg/dL — ABNORMAL HIGH (ref 70–99)

## 2019-11-11 LAB — PHOSPHORUS
Phosphorus: 2.7 mg/dL (ref 2.5–4.6)
Phosphorus: 1.4 mg/dL — ABNORMAL LOW (ref 2.5–4.6)

## 2019-11-11 LAB — PROCALCITONIN: Procalcitonin: 0.18 ng/mL

## 2019-11-11 LAB — STREP PNEUMONIAE URINARY ANTIGEN: Strep Pneumo Urinary Antigen: NEGATIVE

## 2019-11-11 LAB — AMMONIA: Ammonia: 77 umol/L — ABNORMAL HIGH (ref 9–35)

## 2019-11-11 MED ORDER — LACTATED RINGERS IV BOLUS
1000.0000 mL | Freq: Once | INTRAVENOUS | Status: AC
  Administered 2019-11-11: 1000 mL via INTRAVENOUS

## 2019-11-11 MED ORDER — CHLORHEXIDINE GLUCONATE 0.12% ORAL RINSE (MEDLINE KIT)
15.0000 mL | Freq: Two times a day (BID) | OROMUCOSAL | Status: DC
  Administered 2019-11-11 – 2019-11-16 (×10): 15 mL via OROMUCOSAL

## 2019-11-11 MED ORDER — PROPOFOL 1000 MG/100ML IV EMUL
0.0000 ug/kg/min | INTRAVENOUS | Status: DC
  Administered 2019-11-11 (×3): 40 ug/kg/min via INTRAVENOUS
  Administered 2019-11-11: 20 ug/kg/min via INTRAVENOUS
  Administered 2019-11-12 (×5): 40 ug/kg/min via INTRAVENOUS
  Administered 2019-11-12: 20 ug/kg/min via INTRAVENOUS
  Administered 2019-11-12: 40 ug/kg/min via INTRAVENOUS
  Administered 2019-11-13 (×3): 35 ug/kg/min via INTRAVENOUS
  Filled 2019-11-11 (×6): qty 100
  Filled 2019-11-11: qty 1400
  Filled 2019-11-11 (×7): qty 100

## 2019-11-11 MED ORDER — PROSOURCE TF PO LIQD
90.0000 mL | Freq: Three times a day (TID) | ORAL | Status: DC
  Administered 2019-11-11 – 2019-11-14 (×9): 90 mL
  Filled 2019-11-11 (×9): qty 90

## 2019-11-11 MED ORDER — FENTANYL CITRATE (PF) 100 MCG/2ML IJ SOLN
INTRAMUSCULAR | Status: AC
  Administered 2019-11-11: 50 ug via INTRAVENOUS
  Filled 2019-11-11: qty 2

## 2019-11-11 MED ORDER — ETOMIDATE 2 MG/ML IV SOLN
20.0000 mg | Freq: Once | INTRAVENOUS | Status: AC
  Administered 2019-11-11: 20 mg via INTRAVENOUS

## 2019-11-11 MED ORDER — ORAL CARE MOUTH RINSE
15.0000 mL | OROMUCOSAL | Status: DC
  Administered 2019-11-11 – 2019-11-16 (×48): 15 mL via OROMUCOSAL

## 2019-11-11 MED ORDER — SERTRALINE HCL 50 MG PO TABS
100.0000 mg | ORAL_TABLET | Freq: Every day | ORAL | Status: DC
  Administered 2019-11-11 – 2019-11-15 (×5): 100 mg
  Filled 2019-11-11 (×5): qty 2

## 2019-11-11 MED ORDER — FENTANYL CITRATE (PF) 100 MCG/2ML IJ SOLN: 50.0000 ug | Freq: Once | INTRAMUSCULAR | Status: AC

## 2019-11-11 MED ORDER — NOREPINEPHRINE 4 MG/250ML-% IV SOLN: 2.0000 ug/min | INTRAVENOUS | Status: DC

## 2019-11-11 MED ORDER — POLYETHYLENE GLYCOL 3350 17 G PO PACK: 17.0000 g | PACK | Freq: Every day | ORAL | Status: DC

## 2019-11-11 MED ORDER — ROCURONIUM BROMIDE 50 MG/5ML IV SOLN
75.0000 mg | Freq: Once | INTRAVENOUS | Status: AC
  Administered 2019-11-11: 75 mg via INTRAVENOUS
  Filled 2019-11-11: qty 7.5

## 2019-11-11 MED ORDER — NOREPINEPHRINE 4 MG/250ML-% IV SOLN
INTRAVENOUS | Status: AC
  Administered 2019-11-11: 2 ug/min via INTRAVENOUS
  Filled 2019-11-11: qty 250

## 2019-11-11 MED ORDER — FENTANYL CITRATE (PF) 100 MCG/2ML IJ SOLN: 25.0000 ug | INTRAMUSCULAR | Status: DC | PRN

## 2019-11-11 MED ORDER — DOCUSATE SODIUM 50 MG/5ML PO LIQD
100.0000 mg | Freq: Two times a day (BID) | ORAL | Status: DC
  Administered 2019-11-11 – 2019-11-13 (×4): 100 mg via ORAL
  Filled 2019-11-11 (×4): qty 10

## 2019-11-11 MED ORDER — ADULT MULTIVITAMIN W/MINERALS CH
1.0000 | ORAL_TABLET | Freq: Every day | ORAL | Status: DC
  Administered 2019-11-11 – 2019-11-14 (×4): 1 via ORAL
  Filled 2019-11-11 (×3): qty 1

## 2019-11-11 MED ORDER — VITAL HIGH PROTEIN PO LIQD: 1000.0000 mL | ORAL | Status: DC

## 2019-11-11 MED ORDER — LACTULOSE ENEMA
300.0000 mL | Freq: Three times a day (TID) | ORAL | Status: DC
  Administered 2019-11-11 (×4): 300 mL via RECTAL
  Filled 2019-11-11 (×5): qty 300

## 2019-11-11 MED ORDER — PHENYLEPHRINE 40 MCG/ML (10ML) SYRINGE FOR IV PUSH (FOR BLOOD PRESSURE SUPPORT): 80.0000 ug | PREFILLED_SYRINGE | Freq: Once | INTRAVENOUS | Status: DC | PRN

## 2019-11-11 MED ORDER — MIDAZOLAM HCL 2 MG/2ML IJ SOLN
INTRAMUSCULAR | Status: AC
  Administered 2019-11-11: 2 mg via INTRAVENOUS
  Filled 2019-11-11: qty 2

## 2019-11-11 MED ORDER — SODIUM CHLORIDE 0.9 % IV SOLN: 250.0000 mL | INTRAVENOUS | Status: DC

## 2019-11-11 MED ORDER — MIDAZOLAM HCL 2 MG/2ML IJ SOLN: 2.0000 mg | Freq: Once | INTRAMUSCULAR | Status: AC

## 2019-11-11 MED ORDER — VITAL HIGH PROTEIN PO LIQD
1000.0000 mL | ORAL | Status: DC
  Administered 2019-11-11 – 2019-11-14 (×6): 1000 mL

## 2019-11-11 MED ORDER — PROSOURCE TF PO LIQD: 45.0000 mL | Freq: Two times a day (BID) | ORAL | Status: DC

## 2019-11-11 MED ORDER — INSULIN ASPART 100 UNIT/ML ~~LOC~~ SOLN
0.0000 [IU] | SUBCUTANEOUS | Status: DC
  Administered 2019-11-11: 2 [IU] via SUBCUTANEOUS
  Administered 2019-11-11: 3 [IU] via SUBCUTANEOUS
  Administered 2019-11-12 (×2): 1 [IU] via SUBCUTANEOUS
  Administered 2019-11-12 (×2): 2 [IU] via SUBCUTANEOUS
  Administered 2019-11-12: 3 [IU] via SUBCUTANEOUS
  Administered 2019-11-13: 2 [IU] via SUBCUTANEOUS
  Administered 2019-11-13: 1 [IU] via SUBCUTANEOUS
  Administered 2019-11-13: 2 [IU] via SUBCUTANEOUS
  Administered 2019-11-13: 3 [IU] via SUBCUTANEOUS
  Administered 2019-11-13: 1 [IU] via SUBCUTANEOUS
  Administered 2019-11-13 (×2): 2 [IU] via SUBCUTANEOUS
  Administered 2019-11-14: 3 [IU] via SUBCUTANEOUS
  Administered 2019-11-14 – 2019-11-15 (×6): 2 [IU] via SUBCUTANEOUS
  Administered 2019-11-15: 3 [IU] via SUBCUTANEOUS
  Administered 2019-11-15: 2 [IU] via SUBCUTANEOUS
  Administered 2019-11-15: 1 [IU] via SUBCUTANEOUS
  Administered 2019-11-15: 5 [IU] via SUBCUTANEOUS
  Administered 2019-11-16: 2 [IU] via SUBCUTANEOUS
  Administered 2019-11-16 (×2): 5 [IU] via SUBCUTANEOUS
  Administered 2019-11-16: 3 [IU] via SUBCUTANEOUS
  Administered 2019-11-16: 2 [IU] via SUBCUTANEOUS
  Administered 2019-11-16: 5 [IU] via SUBCUTANEOUS
  Administered 2019-11-17 (×3): 2 [IU] via SUBCUTANEOUS
  Administered 2019-11-17: 3 [IU] via SUBCUTANEOUS
  Administered 2019-11-17: 1 [IU] via SUBCUTANEOUS
  Administered 2019-11-18 (×2): 2 [IU] via SUBCUTANEOUS
  Administered 2019-11-18 (×2): 1 [IU] via SUBCUTANEOUS
  Administered 2019-11-19: 3 [IU] via SUBCUTANEOUS
  Administered 2019-11-19: 2 [IU] via SUBCUTANEOUS
  Administered 2019-11-19: 5 [IU] via SUBCUTANEOUS
  Administered 2019-11-20: 1 [IU] via SUBCUTANEOUS
  Administered 2019-11-20: 3 [IU] via SUBCUTANEOUS
  Administered 2019-11-20: 1 [IU] via SUBCUTANEOUS
  Administered 2019-11-20: 2 [IU] via SUBCUTANEOUS
  Administered 2019-11-20: 1 [IU] via SUBCUTANEOUS
  Administered 2019-11-21: 3 [IU] via SUBCUTANEOUS
  Administered 2019-11-21: 2 [IU] via SUBCUTANEOUS
  Administered 2019-11-21 (×3): 1 [IU] via SUBCUTANEOUS
  Administered 2019-11-22 (×2): 2 [IU] via SUBCUTANEOUS
  Administered 2019-11-22: 1 [IU] via SUBCUTANEOUS

## 2019-11-11 MED ORDER — CHLORHEXIDINE GLUCONATE CLOTH 2 % EX PADS
6.0000 | MEDICATED_PAD | Freq: Every day | CUTANEOUS | Status: DC
  Administered 2019-11-12 – 2019-12-01 (×20): 6 via TOPICAL

## 2019-11-11 NOTE — Progress Notes (Addendum)
[facility] Progress Note Patient Name: Kathy Irwin DOB: 24-Feb-1951 MRN: [id number]   Date of Service  11/11/2019  HPI/Events of Note  Hypercarbic Respiratory Failure - ABG on BiPAP = 7.26/63.9/147. ABG little changed with BiPAP therapy.   eICU Interventions  Plan: 1. Will notify ground team of ABG on BiPAP.  Transfer to ICU per Dr. Chase Caller.     Intervention Category Major Interventions: Respiratory failure - evaluation and management  Jinan Biggins Eugene 11/11/2019, 2:01 AM

## 2019-11-11 NOTE — Progress Notes (Signed)
Came to bedside  Patient stable on BiPAP Repeat ABG pending   RN will call eMD with results    SIGNATURE    Dr. Brand Males, M.D., F.C.C.P,  Pulmonary and Critical Care Medicine Staff Physician, [place] Director - Interstitial Lung Disease  Program  Pulmonary [place] [facility] [place], [place], [address]  Pager: [phone], If no answer  OR between  19:00-7:00h: page [phone] Telephone (clinical office): [phone] Telephone (research): [phone]  12:18 AM 11/11/2019

## 2019-11-11 NOTE — Progress Notes (Signed)
[place] Progress Note Patient Name: Kathy Irwin DOB: 02/02/1951 MRN: [id number]   Date of Service  11/11/2019  HPI/Events of Note  Elelavated Ammonia level - Ammonia = 77.   eICU Interventions  Plan: 1. Lactulose enema now and TID, 2. Ammonia level in AM daily start 11/12/2019.      Intervention Category Major Interventions: Other:  [name] 11/11/2019, 1:59 AM

## 2019-11-11 NOTE — Progress Notes (Signed)
SLP Cancellation Note  Patient Details Name: Kathy Irwin MRN: [id number] DOB: 12/20/1950   Cancelled treatment:       Reason Eval/Treat Not Completed: Medical issues which prohibited therapy; pt transferred to ICU last night, on BiPAP.  SLP will follow for readiness.  Cresencia Asmus L. Tivis Ringer, [place] Office number [phone] Pager [phone]    [name] Laurice 11/11/2019, 9:47 AM

## 2019-11-11 NOTE — Progress Notes (Signed)
Patient daughter Mackey Birchwood notified of transfer to 2 M bed 5.Password of "Power Drive" set up to receive information over the phone.

## 2019-11-11 NOTE — Progress Notes (Signed)
1930 Patient lethargic  mews remain red.Vital signs as follows temp.102.5 HR 122 RR 38 B/P 160/99 oxygen level 96% on 4 liters nasal canula.Patient two daughters at bedside [place] very concerned about care mother receiving. 1930 Dr. Olevia Bowens called and notified .Plan to have Dr. Tonie Griffith  come to bedside to evaluate patient.1940 Rapid response called to evaluate patient.Tylenol and tordal given for fever.

## 2019-11-11 NOTE — Progress Notes (Signed)
Patient to transfer to 2 M bed 5. Report called to [facility].

## 2019-11-11 NOTE — Procedures (Signed)
Intubation Procedure Note  Vaneta Hammontree  [id number]  Nov 20, 1950  Date:11/11/19  Time:12:00 PM   Provider Performing:Pete E Kary Kos    Procedure: Intubation (31500)  Indication(s) Respiratory Failure  Consent Risks of the procedure as well as the alternatives and risks of each were explained to the patient and/or caregiver.  Consent for the procedure was obtained and is signed in the bedside chart   Anesthesia Etomidate, Versed, Fentanyl and Rocuronium   Time Out Verified patient identification, verified procedure, site/side was marked, verified correct patient position, special equipment/implants available, medications/allergies/relevant history reviewed, required imaging and test results available.   Sterile Technique Usual hand hygeine, masks, and gloves were used   Procedure Description Patient positioned in bed supine.  Sedation given as noted above.  Patient was intubated with endotracheal tube using Glidescope.  View was Grade 3 only epiglottis .  Number of attempts was 1.  Colorimetric CO2 detector was consistent with tracheal placement.   Complications/Tolerance None; patient tolerated the procedure well. Chest X-ray is ordered to verify placement.   EBL Minimal   Specimen(s) None  Erick Colace ACNP-BC [place] Pager # [phone] OR # [phone] if no answer

## 2019-11-11 NOTE — Progress Notes (Signed)
[place] Progress Note Patient Name: Kathy Irwin DOB: 1950-10-08 MRN: [id number]   Date of Service  11/11/2019  HPI/Events of Note  ABG on BiPAP = 7.27/68.6/96 which is little changed from earlier.   eICU Interventions  Will notify ground team of ABG results.      Intervention Category Major Interventions: Respiratory failure - evaluation and management  Kathy Irwin 11/11/2019, 6:10 AM

## 2019-11-11 NOTE — Progress Notes (Signed)
Blood gas and ammonia results called to Dr. Oletta Darter.

## 2019-11-11 NOTE — Progress Notes (Signed)
MD to come to bedside to evaluate patient.

## 2019-11-11 NOTE — Progress Notes (Signed)
Pt transported fro4NP to 56M w/o event.

## 2019-11-11 NOTE — Progress Notes (Signed)
NAME:  Kathy Irwin, MRN:  [id number], DOB:  14-Jul-1950, LOS: 2 ADMISSION DATE:  11/08/2019, CONSULTATION DATE:  11/10/19 REFERRING MD:  [facility] MD, CHIEF COMPLAINT:  Acute encephaolpathy in setting of sepsis, ILD and phenergan   Brief History   69 year old female with obesity, chronic diastolic failure, ITP, chronic hypoxemic respiratory failure on 3 L nasal cannula at baseline secondary to interstitial lung disease followed by Dr. Vaughan Browner in the ILD clinic at pulmonary clinic.  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  History of COVID-19 in September 2020  ->admitted 7/25 w/ RLE cellulitis in spite of 10d Cephalexin also cc: N/V. PCCM asked to see 7/26 when developed worsening delirium   Past Medical History     has a past medical history of Acute respiratory failure with hypoxia ([place]) (12/2018), Diabetes mellitus without complication ([place]), Hypersensitivity pneumonitis ([place]) (12/19/2017), Hypertension, and ILD (interstitial lung disease) ([place]) (12/24/2018).   reports that she has never smoked. She has never used smokeless tobacco.    Significant Hospital Events   11/09/2019/admit: w/ working dx of cellulitis. Lethargic. CT head ordered. ABX started.  7/26 desaturated. sats 70s. BP dropping. Received IVF bolus.  Developed worsening delirium, actually received Ativan for this.  Critical care consulted later for delirium, which later became more lethargy and ongoing fever, moved to the intensive care. Consults:  11/10/2019-critical care medicine consult  Procedures:  oett 7/27>>>  Significant Diagnostic Tests:  CT right LE 7/27>>>  Micro Data:  7/2 5 - MRSA PCR - neg 7/25- covid - neg BCX2 7/27>>>  Antimicrobials:  vanc 7/25 -  Zosyn 7/25 -   Interim history/subjective:  Still lethargic. RR increased. Leg looks worse per family  Objective   Blood pressure (Abnormal) 102/56, pulse 85, temperature 99.7 F (37.6 C), temperature source Axillary, resp. rate  (Abnormal) 26, SpO2 100 %.    FiO2 (%):  [40 %] 40 %   Intake/Output Summary (Last 24 hours) at 11/11/2019 1036 Last data filed at 11/11/2019 0600 Gross per 24 hour  Intake 1990.98 ml  Output 1200 ml  Net 790.98 ml   There were no vitals filed for this visit.  Examination: General this is a 69 year old black female. She is currently on NIPPV and will awaken but then falls back asleep HENT Neck is large. MMM, BIPAP mask is in place no clear JVD Pulm scattered rhonchi, RR 30s no accessory use on mechanical assist via NIPPV Card RRR  abd obese + bowel sounds GU due to void.  EXT BLE edema the left LE is more swollen, erythremic and remains warm to touch  Resolved Hospital Problem list     Assessment & Plan:   Acute hypercarbic respiratory failure superimposed on h/o Chronic hypoxic respiratory failure ILD; likely element of OHS/OSA as well -Portable chest x-ray on admission with diffuse airspace disease, not much different than previous outpatient film -Developed progressive hypercarbic respiratory failure last night now on BiPAP, most recent arterial blood gas with still with significant respiratory acidosis.  Suspect some of this exacerbated by sedation.  -Mental status: will wake up but don't think she can reliably protect airway   -chronically on 3 liters Plan Intubate/ventilate VAP bundle  PAD protocol Serial abg Sputum culture s/p vent  Daily assessment for diuresis when hemodynamics will allow   Severe sepsis 2/2 RLE cellulitis  Plan Cont mIVFs Check lactate Day 3 vanc and zosyn Will get CT RLE to make sure we don't need surgical  eval  Acute metabolic encephalopathy  Plan Supportive care PAD protocol  Worsening thrombocytopenia w/ H/o ITP - followed by Dr Irene Limbo Plan Trend cbc  Hyperglycemia Plan ssi    Best practice:  Diet: npo-->start tubefeeds Pain/Anxiety/Delirium protocol (if indicated): dc benzo and opipoids VAP protocol (if indicated): x DVT  prophylaxis: scd GI prophylaxis: per triad Glucose control: ssi per triad Mobility: bed rest per triad Code Status: full code Family Communication: daughters x 2 at bedside Disposition: full code ICU  My cct 43 min  Erick Colace ACNP-BC [place] Pager # [phone] OR # [phone] if no answer       LABS    PULMONARY Recent Labs  Lab 11/10/19 2152 11/11/19 0115 11/11/19 0545  PHART 7.275* 7.264* 7.271*  PCO2ART 60.2* 63.9* 68.6*  PO2ART 96.4 147* 96  HCO3 26.8 27.9 31.7*  TCO2  --   --  34*  O2SAT 96.2 98.5 96.0    CBC Recent Labs  Lab 11/08/19 1705 11/08/19 1705 11/10/19 0545 11/10/19 2152 11/11/19 0545  HGB 13.5   < > 10.8* 11.5* 11.9*  HCT 43.6   < > 35.0* 38.1 35.0*  WBC 9.9  --  5.4 4.5  --   PLT 112*  --  74* 68*  --    < > = values in this interval not displayed.    COAGULATION Recent Labs  Lab 11/10/19 2152  INR 1.3*    CARDIAC  No results for input(s): TROPONINI in the last 168 hours. No results for input(s): PROBNP in the last 168 hours.   CHEMISTRY Recent Labs  Lab 11/08/19 1705 11/08/19 1705 11/10/19 0545 11/10/19 0545 11/10/19 2152 11/10/19 2152 11/11/19 0055 11/11/19 0545  NA 143  --  141  --  141  --  141 145  K 3.8   < > 3.7   < > 4.0   < > 4.2 4.0  CL 106  --  109  --  108  --  108  --   CO2 27  --  22  --  26  --  27  --   GLUCOSE 115*  --  202*  --  139*  137*  --  148*  --   BUN 8  --  10  --  12  --  10  --   CREATININE 0.66  --  0.88  --  0.69  --  0.65  --   CALCIUM 9.9  --  8.6*  --  8.9  --  8.8*  --   MG  --   --   --   --  2.0  --   --   --   PHOS  --   --   --   --  2.1*  --   --   --    < > = values in this interval not displayed.   Estimated Creatinine Clearance: 82.8 mL/min (by C-G formula based on SCr of 0.65 mg/dL).   LIVER Recent Labs  Lab 11/08/19 1705 11/10/19 2152 11/11/19 0055  AST _0 ALT _1 ALKPHOS 64 39 39  BILITOT 2.2* 1.9* 1.9*  PROT 7.0 6.2*  5.9*  ALBUMIN 4.1 3.2* 3.0*  INR  --  1.3*  --      INFECTIOUS Recent Labs  Lab 11/10/19 0545 11/10/19 1051 11/10/19 2152 11/11/19 0055  LATICACIDVEN 1.4 1.1 1.5  --   PROCALCITON  --   --  0.23  0.18     ENDOCRINE CBG (last 3)  Recent Labs    11/11/19 0039 11/11/19 0313 11/11/19 0726  GLUCAP 139* 117* 134*         IMAGING x48h  - image(s) personally visualized  -   highlighted in bold CT HEAD WO CONTRAST  Result Date: 11/09/2019 CLINICAL DATA:  69 year old female with history of mental status change. EXAM: CT HEAD WITHOUT CONTRAST TECHNIQUE: Contiguous axial images were obtained from the base of the skull through the vertex without intravenous contrast. COMPARISON:  No priors. FINDINGS: Brain: Physiologic calcifications in the basal ganglia bilaterally. Patchy areas of very mild decreased attenuation are noted throughout the deep and periventricular white matter of the cerebral hemispheres bilaterally, compatible with mild chronic microvascular ischemic disease. No evidence of acute infarction, hemorrhage, hydrocephalus, extra-axial collection or mass lesion/mass effect. Vascular: No hyperdense vessel or unexpected calcification. Skull: Normal. Negative for fracture or focal lesion. Sinuses/Orbits: No acute finding. Other: None. IMPRESSION: 1. No acute intracranial abnormalities. 2. Very mild chronic microvascular ischemic changes in the cerebral white matter, as above. Electronically Signed   By: Vinnie Langton M.D.   On: 11/09/2019 16:30   CT ABDOMEN PELVIS W CONTRAST  Result Date: 11/09/2019 CLINICAL DATA:  Nausea, vomiting EXAM: CT ABDOMEN AND PELVIS WITH CONTRAST TECHNIQUE: Multidetector CT imaging of the abdomen and pelvis was performed using the standard protocol following bolus administration of intravenous contrast. CONTRAST:  129m OMNIPAQUE IOHEXOL 300 MG/ML  SOLN COMPARISON:  08/15/2019, 02/11/2019 FINDINGS: Lower chest: Motion artifact limits evaluation of the  lung bases, however, areas of interstitial thickening, ground-glass infiltrate, and traction bronchiolectasis are again identified, better evaluated on prior CT examination of 02/11/2019. the visualized heart and pericardium are unremarkable. Hepatobiliary: Porcelain gallbladder. Multiple gallstones seen within the gallbladder neck. No intra or extrahepatic biliary ductal dilation. Liver unremarkable. Pancreas: Unremarkable Spleen: Unremarkable Adrenals/Urinary Tract: The adrenal glands are unremarkable. The kidneys are unremarkable. The bladder is unremarkable. Stomach/Bowel: The stomach, small bowel, and large bowel are unremarkable. Appendix normal. No free intraperitoneal gas or fluid. Vascular/Lymphatic: There is no pathologic adenopathy within the abdomen and pelvis. The abdominal vasculature is age-appropriate. Reproductive: Uterus absent. Residual ovaries are unremarkable. No adnexal masses. Other: Moderate fat containing umbilical hernia is present. Rectum unremarkable. Musculoskeletal: Degenerative changes are noted within the lumbar spine and hips. No acute bone abnormality. IMPRESSION: No radiographic explanation for the patient's reported symptoms. Incidental findings as noted above. Electronically Signed   By: AFidela SalisburyMD   On: 11/09/2019 21:45   DG Chest Portable 1 View  Result Date: 11/09/2019 CLINICAL DATA:  69year old female with possible aspiration. EXAM: PORTABLE CHEST 1 VIEW COMPARISON:  Chest radiograph dated 08/11/2019 and CT dated 08/15/2019. FINDINGS: Diffuse bilateral interstitial densities and bronchiectatic changes with greater involvement of the lower lung fields as seen on the prior CT and radiograph in keeping with chronic interstitial lung disease. Overall slight interval increase in pulmonary densities since the prior radiograph which may be related to poor inspiratory effort. Superimposed infiltrate is not entirely excluded. Clinical correlation is recommended. No lobar  consolidation, large pleural effusion or pneumothorax. Stable cardiomediastinal silhouette. No acute osseous pathology. IMPRESSION: 1. Chronic interstitial lung disease.  No focal consolidation. 2. Slight interval increase in the pulmonary densities may be related to poor inspiratory effort. Superimposed infiltrate is not entirely excluded. Clinical correlation is recommended. Electronically Signed   By: AAnner CreteM.D.   On: 11/09/2019 15:43

## 2019-11-11 NOTE — Progress Notes (Signed)
[place] Progress Note Patient Name: Dreamer Carillo DOB: 1950/08/15 MRN: [id number]   Date of Service  11/11/2019  HPI/Events of Note  Oliguria - Bladder scan with 400 mL residual.   eICU Interventions  Plan: 1. I/O Cath PRN.      Intervention Category Major Interventions: Other:  Lysle Dingwall 11/11/2019, 3:34 AM

## 2019-11-11 NOTE — Telephone Encounter (Signed)
Rx was sent by PCP.

## 2019-11-11 NOTE — Progress Notes (Signed)
Patient transported to CT without any apparent complications.

## 2019-11-11 NOTE — Progress Notes (Signed)
Pt transported to 18M05 with RN and RT. Tonya, dtr updated as to plan of care on arrival to 18M.

## 2019-11-11 NOTE — Progress Notes (Signed)
Initial Nutrition Assessment  DOCUMENTATION CODES:   Morbid obesity   INTERVENTION:   Initiate tube feeding via OG tube: Vital High Protein at 30 ml/h (720 ml per day) Prosource TF 90 ml TID  Provides 960 kcal (1712 kcal total with propofol), 129 gm protein, 602 ml free water daily  NUTRITION DIAGNOSIS:   Inadequate oral intake related to inability to eat as evidenced by NPO status.  GOAL:   Provide needs based on ASPEN/SCCM guidelines  MONITOR:   Vent status, TF tolerance, Labs, Skin  REASON FOR ASSESSMENT:   Ventilator, Consult Enteral/tube feeding initiation and management  ASSESSMENT:     69 yo female admitted with septic shock, cellulitis R leg. PMH includes ILD-baseline 3 L [place], obesity, ITP, HF, COVID-19.  Discussed patient in ICU rounds and with RN today. Patient developed worsening delirium and respiratory failure, requiring intubation 7/27.  Received MD Consult for TF initiation and management. OG tube in place.   Patient is currently intubated on ventilator support MV: 10 L/min Temp (24hrs), Avg:99.7 F (37.6 C), Min:97.7 F (36.5 C), Max:102.6 F (39.2 C) Propofol at 28.5 providing 752 kcal from lipid   Labs reviewed. Sodium 146 CBG: 134-39-114  Medications reviewed and include colace, lactulose, novolog, propofol.   NUTRITION - FOCUSED PHYSICAL EXAM:    Most Recent Value  Orbital Region No depletion  Upper Arm Region No depletion  Thoracic and Lumbar Region No depletion  Buccal Region No depletion  Temple Region No depletion  Clavicle Bone Region No depletion  Clavicle and Acromion Bone Region No depletion  Scapular Bone Region Unable to assess  Dorsal Hand No depletion  Patellar Region No depletion  Anterior Thigh Region No depletion  Posterior Calf Region No depletion  Edema (RD Assessment) Moderate  Hair Reviewed  Eyes Unable to assess  Mouth Unable to assess  Skin Reviewed  Nails Reviewed       Diet Order:   Diet Order             Diet NPO time specified  Diet effective now                 EDUCATION NEEDS:   Not appropriate for education at this time  Skin:  Skin Assessment: Reviewed RN Assessment (R leg cellulitis)  Last BM:  7/27  Height:   Ht Readings from Last 1 Encounters:  11/11/19 _0  (1.6 m)    Weight:   Wt Readings from Last 1 Encounters:  11/04/19 118.9 kg    Ideal Body Weight:  52.3 kg  BMI:  Body mass index is 46.45 kg/m.  Estimated Nutritional Needs:   Kcal:  4270-6237  Protein:  110-130 gm  Fluid:  >/= 1.7 L    Lucas Mallow, RD, LDN, CNSC Please refer to Amion for contact information.

## 2019-11-11 NOTE — Progress Notes (Signed)
Blood gas results called to Dr. Oletta Darter.

## 2019-11-11 NOTE — Progress Notes (Signed)
Inpatient Diabetes Program Recommendations  AACE/ADA: New Consensus Statement on Inpatient Glycemic Control (2015)  Target Ranges:  Prepandial:   less than 140 mg/dL      Peak postprandial:   less than 180 mg/dL (1-2 hours)      Critically ill patients:  140 - 180 mg/dL   Lab Results  Component Value Date   GLUCAP 114 (H) 11/11/2019   HGBA1C 7.2 (H) 11/08/2019    Review of Glycemic Control Results for Kathy Irwin, Kathy Irwin (MRN [id number]) as of 11/11/2019 13:09  Ref. Range 11/11/2019 00:39 11/11/2019 03:13 11/11/2019 07:26 11/11/2019 11:30 11/11/2019 11:32  Glucose-Capillary Latest Ref Range: 70 - 99 mg/dL 139 (H) 117 (H) 134 (H) 39 (LL) 114 (H)   Diabetes history: DM 2 Outpatient Diabetes medications: Amaryl 4 mg daily, Humalog 75/25 50 units with breakfast and Humalog 75/25 30 units in evening Current orders for Inpatient glycemic control:  Novolog sensitive q 4 hours Inpatient Diabetes Program Recommendations:    Consider reducing Novolog correction to very sensitive (0-6 units) q 4 hours.   Thanks  Adah Perl, RN, BC-ADM Inpatient Diabetes Coordinator Pager [phone] (8a-5p)

## 2019-11-11 NOTE — Progress Notes (Signed)
Pt arrived to 19M very lethargic on BiPAP, not following commands, no intelligible speech. Immediately after lactulose enema pt woke up A&Ox2, asked to get up to the bathroom multiple times, and followed commands. After cleaning up pt, she fell back asleep and is lethargic again, still following commands. Dr. Chase Caller aware, will continue to monitor closely.

## 2019-11-12 ENCOUNTER — Encounter (HOSPITAL_COMMUNITY): Payer: Self-pay

## 2019-11-12 DIAGNOSIS — J9622 Acute and chronic respiratory failure with hypercapnia: Secondary | ICD-10-CM | POA: Diagnosis not present

## 2019-11-12 DIAGNOSIS — L03115 Cellulitis of right lower limb: Secondary | ICD-10-CM | POA: Diagnosis not present

## 2019-11-12 DIAGNOSIS — J9621 Acute and chronic respiratory failure with hypoxia: Secondary | ICD-10-CM | POA: Diagnosis not present

## 2019-11-12 LAB — CBC
HCT: 37.6 % (ref 36.0–46.0)
Hemoglobin: 11.1 g/dL — ABNORMAL LOW (ref 12.0–15.0)
MCH: 31.6 pg (ref 26.0–34.0)
MCHC: 29.5 g/dL — ABNORMAL LOW (ref 30.0–36.0)
MCV: 107.1 fL — ABNORMAL HIGH (ref 80.0–100.0)
Platelets: 42 K/uL — ABNORMAL LOW (ref 150–400)
RBC: 3.51 MIL/uL — ABNORMAL LOW (ref 3.87–5.11)
RDW: 18.6 % — ABNORMAL HIGH (ref 11.5–15.5)
WBC: 3.5 K/uL — ABNORMAL LOW (ref 4.0–10.5)
nRBC: 0.9 % — ABNORMAL HIGH (ref 0.0–0.2)

## 2019-11-12 LAB — TROPONIN I (HIGH SENSITIVITY)
Troponin I (High Sensitivity): 120 ng/L (ref <18)
Troponin I (High Sensitivity): 163 ng/L (ref <18)

## 2019-11-12 LAB — COMPREHENSIVE METABOLIC PANEL
ALT: 17 U/L (ref 0–44)
AST: 21 U/L (ref 15–41)
Albumin: 2.7 g/dL — ABNORMAL LOW (ref 3.5–5.0)
Alkaline Phosphatase: 35 U/L — ABNORMAL LOW (ref 38–126)
Anion gap: 11 (ref 5–15)
BUN: 8 mg/dL (ref 8–23)
CO2: 23 mmol/L (ref 22–32)
Calcium: 9.1 mg/dL (ref 8.9–10.3)
Chloride: 113 mmol/L — ABNORMAL HIGH (ref 98–111)
Creatinine, Ser: 0.55 mg/dL (ref 0.44–1.00)
GFR calc Af Amer: >60 mL/min (ref >60)
GFR calc non Af Amer: >60 mL/min (ref >60)
Glucose, Bld: 142 mg/dL — ABNORMAL HIGH (ref 70–99)
Potassium: 3.3 mmol/L — ABNORMAL LOW (ref 3.5–5.1)
Sodium: 147 mmol/L — ABNORMAL HIGH (ref 135–145)
Total Bilirubin: 0.8 mg/dL (ref 0.3–1.2)
Total Protein: 5.4 g/dL — ABNORMAL LOW (ref 6.5–8.1)

## 2019-11-12 LAB — AMMONIA: Ammonia: 56 umol/L — ABNORMAL HIGH (ref 9–35)

## 2019-11-12 LAB — PHOSPHORUS
Phosphorus: 1.2 mg/dL — ABNORMAL LOW (ref 2.5–4.6)
Phosphorus: 3.6 mg/dL (ref 2.5–4.6)

## 2019-11-12 LAB — MAGNESIUM
Magnesium: 1.9 mg/dL (ref 1.7–2.4)
Magnesium: 1.6 mg/dL — ABNORMAL LOW (ref 1.7–2.4)

## 2019-11-12 LAB — GLUCOSE, CAPILLARY
Glucose-Capillary: 119 mg/dL — ABNORMAL HIGH (ref 70–99)
Glucose-Capillary: 145 mg/dL — ABNORMAL HIGH (ref 70–99)
Glucose-Capillary: 145 mg/dL — ABNORMAL HIGH (ref 70–99)
Glucose-Capillary: 148 mg/dL — ABNORMAL HIGH (ref 70–99)
Glucose-Capillary: 163 mg/dL — ABNORMAL HIGH (ref 70–99)
Glucose-Capillary: 168 mg/dL — ABNORMAL HIGH (ref 70–99)
Glucose-Capillary: 209 mg/dL — ABNORMAL HIGH (ref 70–99)

## 2019-11-12 LAB — PROCALCITONIN: Procalcitonin: <0.1 ng/mL

## 2019-11-12 LAB — VANCOMYCIN, TROUGH: Vancomycin Tr: 8 ug/mL — ABNORMAL LOW (ref 15–20)

## 2019-11-12 LAB — LEGIONELLA PNEUMOPHILA SEROGP 1 UR AG: L. pneumophila Serogp 1 Ur Ag: NEGATIVE

## 2019-11-12 LAB — TRIGLYCERIDES: Triglycerides: 294 mg/dL — ABNORMAL HIGH (ref <150)

## 2019-11-12 MED ORDER — VANCOMYCIN HCL 1500 MG/300ML IV SOLN: 1500.0000 mg | Freq: Two times a day (BID) | INTRAVENOUS | Status: DC

## 2019-11-12 MED ORDER — POTASSIUM PHOSPHATES 15 MMOLE/5ML IV SOLN
30.0000 mmol | Freq: Once | INTRAVENOUS | Status: AC
  Administered 2019-11-12: 30 mmol via INTRAVENOUS
  Filled 2019-11-12: qty 10

## 2019-11-12 MED ORDER — VANCOMYCIN HCL 1500 MG/300ML IV SOLN
1500.0000 mg | Freq: Once | INTRAVENOUS | Status: DC
  Filled 2019-11-12: qty 300

## 2019-11-12 MED ORDER — FENTANYL CITRATE (PF) 100 MCG/2ML IJ SOLN
12.5000 ug | INTRAMUSCULAR | Status: DC | PRN
  Administered 2019-11-12 – 2019-11-18 (×8): 12.5 ug via INTRAVENOUS
  Filled 2019-11-12 (×8): qty 2

## 2019-11-12 MED ORDER — POTASSIUM CHLORIDE 20 MEQ PO PACK
40.0000 meq | PACK | Freq: Once | ORAL | Status: AC
  Administered 2019-11-12: 40 meq
  Filled 2019-11-12: qty 2

## 2019-11-12 MED ORDER — FUROSEMIDE 10 MG/ML IJ SOLN
80.0000 mg | Freq: Once | INTRAMUSCULAR | Status: AC
  Administered 2019-11-12: 80 mg via INTRAVENOUS
  Filled 2019-11-12: qty 8

## 2019-11-12 MED ORDER — FAMOTIDINE 40 MG/5ML PO SUSR
40.0000 mg | Freq: Every day | ORAL | Status: DC
  Administered 2019-11-12 – 2019-11-16 (×5): 40 mg
  Filled 2019-11-12 (×6): qty 5

## 2019-11-12 MED ORDER — FREE WATER
200.0000 mL | Status: DC
  Administered 2019-11-12 – 2019-11-13 (×7): 200 mL

## 2019-11-12 NOTE — Progress Notes (Signed)
Pharmacy Antibiotic Note  Kathy Irwin is a 69 y.o. female admitted on 11/08/2019 with sepsis d/t cellulitis.  Pharmacy has been consulted for vancomycin and Zosyn dosing.  Vanc trough below goal at 8.  Plan: Vancomycin 1543m IV every 12 hours.  Goal trough 15-20 mcg/mL.  Calc'd trough ~15. Continue Zosyn.  Height: _0  (160 cm) Weight: (!) 125.9 kg (277 lb 9 oz) IBW/kg (Calculated) : 52.4  Temp (24hrs), Avg:98.6 F (37 C), Min:97.8 F (36.6 C), Max:100.2 F (37.9 C)  Recent Labs  Lab 11/08/19 1705 11/10/19 0545 11/10/19 1051 11/10/19 2152 11/11/19 0055 11/12/19 0555  WBC 9.9 5.4  --  4.5  --   --   CREATININE 0.66 0.88  --  0.69 0.65  --   LATICACIDVEN 1.6 1.4 1.1 1.5  --   --   VANCOTROUGH  --   --   --   --   --  8*    Estimated Creatinine Clearance: 85.7 mL/min (by C-G formula based on SCr of 0.65 mg/dL).    Allergies  Allergen Reactions  . Other Shortness Of Breath and Other (See Comments)    Newspaper ink =  new chest pain, also  . Iodine Other (See Comments)    "Was a long time ago" (Reaction??)  . Merbromin Other (See Comments)    Mercurochrome- "Was a long time ago" (Reaction??)  . Tape     Antimicrobials this admission: Vanc 7/25 >>  Zosyn 7/25 >>   Dose adjustments this admission: Vanc 7572mQ12H >> trough 8 >> change 150041m12H  Microbiology results: 7/25 BCx - NGTD 7/26 covid / MRSA PCR - negative 7/27 Strep pneumo - negative  Thank you for allowing pharmacy to be a part of this patient's care.  VerWynona NeatharmD, BCPS  11/12/2019 6:49 AM

## 2019-11-12 NOTE — Progress Notes (Signed)
[place] Progress Note Patient Name: Kathy Irwin DOB: 09-Dec-1950 MRN: [id number]   Date of Service  11/12/2019  HPI/Events of Note  Notified about troponin 120. It was done at 0555 in AM.  Discussed with bed side RN.  EKG from AM seen: NS, artifacts in baseline. Non specific changes. qtc 437. Possible arm lead riversal.   Dx: Acute on chronic respiratory failure in setting of sepsis with hx of ILD and presumed sleep disordered breathing. - full vent support   Rt leg cellulitis with sepsis. - continue ABx  Thrombocytopenia. - from sepsis and has hx of ITP  eICU Interventions  - repeat troponin I and call back if going up.      Intervention Category Intermediate Interventions: Diagnostic test evaluation  Elmer Sow 11/12/2019, 8:16 PM

## 2019-11-12 NOTE — Progress Notes (Signed)
SLP Cancellation Note  Patient Details Name: Kathy Irwin MRN: [id number] DOB: 08-Oct-1950   Cancelled treatment:       Reason Eval/Treat Not Completed: Medical issues which prohibited therapy (pt now intubated). Will continue to follow.    Osie Bond., M.A. [place] Pager [phone] Office [phone]  11/12/2019, 7:09 AM

## 2019-11-12 NOTE — Progress Notes (Signed)
NAME:  Kathy Irwin, MRN:  [id number], DOB:  [birth year], LOS: 3 ADMISSION DATE:  11/08/2019, CONSULTATION DATE:  11/10/19 REFERRING MD:  Triad MD, CHIEF COMPLAINT:  Acute encephaolpathy in setting of sepsis, ILD and phenergan   Brief History   69 year old female with obesity, chronic diastolic failure, ITP, chronic hypoxemic respiratory failure on 3 L nasal cannula at baseline secondary to interstitial lung disease followed by Dr. Vaughan Browner in the ILD clinic at pulmonary clinic.  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  History of COVID-19 in September 2020  ->admitted 7/25 w/ RLE cellulitis in spite of 10d Cephalexin also cc: N/V. PCCM asked to see 7/26 when developed worsening delirium   Past Medical History     has a past medical history of Acute respiratory failure with hypoxia ([place]) (12/2018), Diabetes mellitus without complication ([place]), Hypersensitivity pneumonitis ([place]) (12/19/2017), Hypertension, and ILD (interstitial lung disease) ([place]) (12/24/2018).   reports that she has never smoked. She has never used smokeless tobacco.    Significant Hospital Events   11/09/2019/admit: w/ working dx of cellulitis. Lethargic. CT head ordered. ABX started.  7/26 desaturated. sats 70s. BP dropping. Received IVF bolus.  Developed worsening delirium, actually received Ativan for this.  Critical care consulted later for delirium, which later became more lethargy and ongoing fever, moved to the intensive care. 7/27 intubated-->still w/ decreased MS and hypercarbia. Also trying to facilitate CT imaging which showed: "1. Moderate diffuse skin thickening and subcutaneous soft tissue swelling/edema/fluid suggesting cellulitis. No discrete fluid collection to suggest a drainable soft tissue abscess. 2. No CT findings to suggest myofasciitis or pyomyositis.3. No CT findings suspicious for septic arthritis or osteomyelitis". Was briefly pressor dependent  7/28 discoloration of right LE a  little better.  Consults:  11/10/2019-critical care medicine consult  Procedures:  oett 7/27>>>  Significant Diagnostic Tests:  CT right LE 7/27>>>"1. Moderate diffuse skin thickening and subcutaneous soft tissue swelling/edema/fluid suggesting cellulitis. No discrete fluid collection to suggest a drainable soft tissue abscess. 2. No CT findings to suggest myofasciitis or pyomyositis.3. No CT findings suspicious for septic arthritis or osteomyelitis".   Micro Data:  7/2 5 - MRSA PCR - neg 7/25- covid - neg BCX2 7/27>>>  Antimicrobials:  vanc 7/25 - 7/28 Zosyn 7/25 -   Interim history/subjective:  Sedated on vent.  Objective   Blood pressure (Abnormal) 109/62, pulse 91, temperature 98.2 F (36.8 C), resp. rate (Abnormal) 26, height 5' 3" (1.6 m), weight (Abnormal) 125.9 kg, SpO2 94 %.    Vent Mode: PRVC FiO2 (%):  [40 %-100 %] 40 % Set Rate:  [24 bmp] 24 bmp Vt Set:  [420 mL] 420 mL PEEP:  [5 cmH20] 5 cmH20 Plateau Pressure:  [19 cmH20-22 cmH20] 22 cmH20   Intake/Output Summary (Last 24 hours) at 11/12/2019 0831 Last data filed at 11/12/2019 0800 Gross per 24 hour  Intake 3551.03 ml  Output 1600 ml  Net 1951.03 ml   Filed Weights   11/12/19 0409  Weight: (Abnormal) 125.9 kg    Examination: General: This is a 69 year old obese black female she is currently sedated on a propofol infusion and remains on full ventilatory support after failing attempt at spontaneous breathing trial this morning HEENT normocephalic atraumatic no jugular venous distention is appreciated.  Sclera are nonicteric she is orally intubated Pulmonary: Clear to auscultation diminished bases no accessory use currently 40% FiO2/5 of PEEP.  Saturations 100% triggers apnea alarm during attempt for spontaneous breathing trial Cardiac: Regular  rate and rhythm without murmur rub or gallop Abdomen: Soft nontender obese but no clear organomegaly Extremities: Warm and dry.  The right lower extremity remains  discolored and erythemic however discoloration has decreased below previously marked area and looks somewhat improved.  There is still some weeping and small blisters anteriorly. GU: Clear yellow Neuro: Currently heavily sedated on propofol infusion pending wake-up assessment  Resolved Hospital Problem list     Assessment & Plan:   Acute hypercarbic respiratory failure superimposed on h/o Chronic hypoxic respiratory failure ILD; likely element of OHS/OSA as well -Chronically on 3 L -She failed attempt at pressure support ventilation; I think this was from sedation more than underlying pulmonary pathology Plan Continue full ventilatory support  Change RASS goal to 0, then reinitiate spontaneous breathing trial attempt  IV Lasix today  A.m. chest x-ray  VAP bundle  Follow-up sputum culture  Severe sepsis 2/2 RLE cellulitis  -CT imaging without evidence of myocytitis, pyomyositis, or drainable fluid collection; marked areas actually improved when comparing film exam on 7/27  plan KVO IV fluids  Day #4 vancomycin and Zosyn, as she has not grown any Staphylococcus I think we can stop vancomycin today   Fluid and Electrolyte Imbalance: Hypernatremia and hypophosphatemia & hypokalemia   Plan Replace free water Replace phosphorus   Acute metabolic encephalopathy: This is improved some prior to intubation however not back to baseline Plan PAD protocol with RASS goal as mentioned above  Worsening thrombocytopenia w/ H/o ITP - followed by Dr Irene Limbo.  Platelets are dropping slowly Plan Trending CBC  Hyperglycemia Plan Sliding scale insulin   Best practice:  Diet: npo-->start tubefeeds Pain/Anxiety/Delirium protocol (if indicated): dc benzo and opipoids VAP protocol (if indicated): x DVT prophylaxis: scd GI prophylaxis: per triad Glucose control: ssi per triad Mobility: bed rest per triad Code Status: full code Family Communication: daughters x 2 at bedside Disposition: full  code ICU  My cct 32 min   Erick Colace ACNP-BC [place] Pager # [phone] OR # [phone] if no answer       LABS    PULMONARY Recent Labs  Lab 11/10/19 2152 11/11/19 0115 11/11/19 0545 11/11/19 1245  PHART 7.275* 7.264* 7.271* 7.341*  PCO2ART 60.2* 63.9* 68.6* 59.6*  PO2ART 96.4 147* 96 271*  HCO3 26.8 27.9 31.7* 32.2*  TCO2  --   --  34* 34*  O2SAT 96.2 98.5 96.0 100.0    CBC Recent Labs  Lab 11/10/19 0545 11/10/19 0545 11/10/19 2152 11/10/19 2152 11/11/19 0545 11/11/19 1245 11/12/19 0555  HGB 10.8*   < > 11.5*   < > 11.9* 12.6 11.1*  HCT 35.0*   < > 38.1   < > 35.0* 37.0 37.6  WBC 5.4  --  4.5  --   --   --  3.5*  PLT 74*  --  68*  --   --   --  42*   < > = values in this interval not displayed.    COAGULATION Recent Labs  Lab 11/10/19 2152  INR 1.3*    CARDIAC  No results for input(s): TROPONINI in the last 168 hours. No results for input(s): PROBNP in the last 168 hours.   CHEMISTRY Recent Labs  Lab 11/08/19 1705 11/08/19 1705 11/10/19 0545 11/10/19 0545 11/10/19 2152 11/10/19 2152 11/11/19 0055 11/11/19 0055 11/11/19 0545 11/11/19 1203 11/11/19 1245 11/11/19 1628  NA 143   < > 141  --  141  --  141  --  145  --  146*  --   K 3.8   < > 3.7   < > 4.0   < > 4.2   < > 4.0  --  4.1  --   CL 106  --  109  --  108  --  108  --   --   --   --   --   CO2 27  --  22  --  26  --  27  --   --   --   --   --   GLUCOSE 115*  --  202*  --  139*  137*  --  148*  --   --   --   --   --   BUN 8  --  10  --  12  --  10  --   --   --   --   --   CREATININE 0.66  --  0.88  --  0.69  --  0.65  --   --   --   --   --   CALCIUM 9.9  --  8.6*  --  8.9  --  8.8*  --   --   --   --   --   MG  --   --   --   --  2.0  --   --   --   --  2.2  --  1.9  PHOS  --   --   --   --  2.1*  --   --   --   --  2.7  --  1.4*   < > = values in this interval not displayed.   Estimated Creatinine Clearance: 85.7 mL/min (by C-G formula based on  SCr of 0.65 mg/dL).   LIVER Recent Labs  Lab 11/08/19 1705 11/10/19 2152 11/11/19 0055  AST _0 ALT _1 ALKPHOS 64 39 39  BILITOT 2.2* 1.9* 1.9*  PROT 7.0 6.2* 5.9*  ALBUMIN 4.1 3.2* 3.0*  INR  --  1.3*  --      INFECTIOUS Recent Labs  Lab 11/10/19 0545 11/10/19 1051 11/10/19 2152 11/11/19 0055 11/12/19 0555  LATICACIDVEN 1.4 1.1 1.5  --   --   PROCALCITON  --   --  0.23 0.18 <0.10     ENDOCRINE CBG (last 3)  Recent Labs    11/12/19 0014 11/12/19 0343 11/12/19 0730  GLUCAP 209* 148* 119*

## 2019-11-13 DIAGNOSIS — J9622 Acute and chronic respiratory failure with hypercapnia: Secondary | ICD-10-CM | POA: Diagnosis not present

## 2019-11-13 DIAGNOSIS — L03115 Cellulitis of right lower limb: Secondary | ICD-10-CM | POA: Diagnosis not present

## 2019-11-13 DIAGNOSIS — J9621 Acute and chronic respiratory failure with hypoxia: Secondary | ICD-10-CM | POA: Diagnosis not present

## 2019-11-13 LAB — TROPONIN I (HIGH SENSITIVITY)
Troponin I (High Sensitivity): 95 ng/L — ABNORMAL HIGH (ref <18)
Troponin I (High Sensitivity): 99 ng/L — ABNORMAL HIGH (ref <18)

## 2019-11-13 LAB — CBC
HCT: 33 % — ABNORMAL LOW (ref 36.0–46.0)
Hemoglobin: 10.5 g/dL — ABNORMAL LOW (ref 12.0–15.0)
MCH: 31.8 pg (ref 26.0–34.0)
MCHC: 31.8 g/dL (ref 30.0–36.0)
MCV: 100 fL (ref 80.0–100.0)
Platelets: 52 10*3/uL — ABNORMAL LOW (ref 150–400)
RBC: 3.3 MIL/uL — ABNORMAL LOW (ref 3.87–5.11)
RDW: 18.4 % — ABNORMAL HIGH (ref 11.5–15.5)
WBC: 3.5 10*3/uL — ABNORMAL LOW (ref 4.0–10.5)
nRBC: 0 % (ref 0.0–0.2)

## 2019-11-13 LAB — GLUCOSE, CAPILLARY
Glucose-Capillary: 39 mg/dL — CL (ref 70–99)
Glucose-Capillary: 136 mg/dL — ABNORMAL HIGH (ref 70–99)
Glucose-Capillary: 153 mg/dL — ABNORMAL HIGH (ref 70–99)
Glucose-Capillary: 172 mg/dL — ABNORMAL HIGH (ref 70–99)
Glucose-Capillary: 182 mg/dL — ABNORMAL HIGH (ref 70–99)
Glucose-Capillary: 213 mg/dL — ABNORMAL HIGH (ref 70–99)
Glucose-Capillary: 198 mg/dL — ABNORMAL HIGH (ref 70–99)

## 2019-11-13 LAB — BASIC METABOLIC PANEL
Anion gap: 12 (ref 5–15)
BUN: 16 mg/dL (ref 8–23)
CO2: 25 mmol/L (ref 22–32)
Calcium: 9.8 mg/dL (ref 8.9–10.3)
Chloride: 109 mmol/L (ref 98–111)
Creatinine, Ser: 0.68 mg/dL (ref 0.44–1.00)
GFR calc Af Amer: >60 mL/min (ref >60)
GFR calc non Af Amer: >60 mL/min (ref >60)
Glucose, Bld: 147 mg/dL — ABNORMAL HIGH (ref 70–99)
Potassium: 3.5 mmol/L (ref 3.5–5.1)
Sodium: 146 mmol/L — ABNORMAL HIGH (ref 135–145)

## 2019-11-13 LAB — CULTURE, RESPIRATORY W GRAM STAIN: Culture: NORMAL

## 2019-11-13 LAB — TRIGLYCERIDES: Triglycerides: 132 mg/dL (ref <150)

## 2019-11-13 MED ORDER — HYDRALAZINE HCL 20 MG/ML IJ SOLN
20.0000 mg | INTRAMUSCULAR | Status: DC | PRN
  Administered 2019-11-13 – 2019-11-16 (×3): 20 mg via INTRAVENOUS
  Filled 2019-11-13 (×3): qty 1

## 2019-11-13 MED ORDER — FUROSEMIDE 10 MG/ML IJ SOLN
80.0000 mg | Freq: Two times a day (BID) | INTRAMUSCULAR | Status: DC
  Administered 2019-11-13 – 2019-11-16 (×7): 80 mg via INTRAVENOUS
  Filled 2019-11-13 (×7): qty 8

## 2019-11-13 MED ORDER — DOCUSATE SODIUM 50 MG/5ML PO LIQD: 100.0000 mg | Freq: Two times a day (BID) | ORAL | Status: DC

## 2019-11-13 MED ORDER — MAGNESIUM SULFATE 4 GM/100ML IV SOLN
4.0000 g | Freq: Once | INTRAVENOUS | Status: AC
  Administered 2019-11-13: 4 g via INTRAVENOUS
  Filled 2019-11-13: qty 100

## 2019-11-13 MED ORDER — DOCUSATE SODIUM 50 MG/5ML PO LIQD
100.0000 mg | Freq: Two times a day (BID) | ORAL | Status: DC
  Administered 2019-11-13 – 2019-11-16 (×6): 100 mg
  Filled 2019-11-13 (×7): qty 10

## 2019-11-13 MED ORDER — DEXMEDETOMIDINE HCL IN NACL 400 MCG/100ML IV SOLN
0.0000 ug/kg/h | INTRAVENOUS | Status: AC
  Administered 2019-11-13: 0.8 ug/kg/h via INTRAVENOUS
  Administered 2019-11-13: 0.6 ug/kg/h via INTRAVENOUS
  Administered 2019-11-13: 0.4 ug/kg/h via INTRAVENOUS
  Administered 2019-11-13: 0.8 ug/kg/h via INTRAVENOUS
  Administered 2019-11-14: 0.6 ug/kg/h via INTRAVENOUS
  Administered 2019-11-14: 0.8 ug/kg/h via INTRAVENOUS
  Administered 2019-11-14: 0.5 ug/kg/h via INTRAVENOUS
  Administered 2019-11-14 – 2019-11-15 (×5): 0.8 ug/kg/h via INTRAVENOUS
  Administered 2019-11-15: 0.6 ug/kg/h via INTRAVENOUS
  Administered 2019-11-15: 0.5 ug/kg/h via INTRAVENOUS
  Administered 2019-11-16 (×2): 0.9 ug/kg/h via INTRAVENOUS
  Filled 2019-11-13 (×17): qty 100

## 2019-11-13 MED ORDER — POTASSIUM CHLORIDE 20 MEQ/15ML (10%) PO SOLN
40.0000 meq | ORAL | Status: AC
  Administered 2019-11-13 (×2): 40 meq
  Filled 2019-11-13 (×2): qty 30

## 2019-11-13 MED ORDER — FREE WATER
200.0000 mL | Status: DC
  Administered 2019-11-13 – 2019-11-14 (×8): 200 mL

## 2019-11-13 MED ORDER — POLYETHYLENE GLYCOL 3350 17 G PO PACK
17.0000 g | PACK | Freq: Every day | ORAL | Status: DC
  Administered 2019-11-14 – 2019-11-18 (×4): 17 g via ORAL
  Filled 2019-11-13 (×6): qty 1

## 2019-11-13 NOTE — Progress Notes (Signed)
[facility] Progress Note Patient Name: Kathy Irwin DOB: [birth year] MRN: [id number]   Date of Service  11/13/2019  HPI/Events of Note  Troponin up 163, was 120 at 4 AM on 28th. EKG from ED had lead reversal articat.  Not doubling, stabilizing. Still could be demand ischemia from sepsis/resp failure. Has low platelet also.  eICU Interventions  - get stat EKG - follow 3 rd troponin at 4 am. If going high Cardiology eval. No AC. On VTE prophylaxis.      Intervention Category Intermediate Interventions: Diagnostic test evaluation  Elmer Sow 11/13/2019, 12:29 AM

## 2019-11-13 NOTE — Progress Notes (Signed)
NAME:  Kathy Irwin, MRN:  [id number], DOB:  [birth year], LOS: 4 ADMISSION DATE:  11/08/2019, CONSULTATION DATE:  11/10/19 REFERRING MD:  Triad MD, CHIEF COMPLAINT:  Acute encephaolpathy in setting of sepsis, ILD and phenergan   Brief History   69 year old female with obesity, chronic diastolic failure, ITP, chronic hypoxemic respiratory failure on 3 L nasal cannula at baseline secondary to interstitial lung disease followed by Dr. Vaughan Browner in the ILD clinic at pulmonary clinic.  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  History of COVID-19 in September 2020  ->admitted 7/25 w/ RLE cellulitis in spite of 10d Cephalexin also cc: N/V. PCCM asked to see 7/26 when developed worsening delirium   Past Medical History     has a past medical history of Acute respiratory failure with hypoxia ([place]) (12/2018), Diabetes mellitus without complication ([place]), Hypersensitivity pneumonitis ([place]) (12/19/2017), Hypertension, and ILD (interstitial lung disease) ([place]) (12/24/2018).   reports that she has never smoked. She has never used smokeless tobacco.    Significant Hospital Events   11/09/2019/admit: w/ working dx of cellulitis. Lethargic. CT head ordered. ABX started.  7/26 desaturated. sats 70s. BP dropping. Received IVF bolus.  Developed worsening delirium, actually received Ativan for this.  Critical care consulted later for delirium, which later became more lethargy and ongoing fever, moved to the intensive care. 7/27 intubated-->still w/ decreased MS and hypercarbia. Also trying to facilitate CT imaging which showed: "1. Moderate diffuse skin thickening and subcutaneous soft tissue swelling/edema/fluid suggesting cellulitis. No discrete fluid collection to suggest a drainable soft tissue abscess. 2. No CT findings to suggest myofasciitis or pyomyositis.3. No CT findings suspicious for septic arthritis or osteomyelitis". Was briefly pressor dependent  7/28 discoloration of right LE a  little better.  7/29: Failed spontaneous breathing trial became tachypneic.  Still 1.2 L positive since arrival to the intensive care.  Continuing IV diuresis.  Changing propofol to Precedex Consults:  11/10/2019-critical care medicine consult  Procedures:  oett 7/27>>>  Significant Diagnostic Tests:  CT right LE 7/27>>>"1. Moderate diffuse skin thickening and subcutaneous soft tissue swelling/edema/fluid suggesting cellulitis. No discrete fluid collection to suggest a drainable soft tissue abscess. 2. No CT findings to suggest myofasciitis or pyomyositis.3. No CT findings suspicious for septic arthritis or osteomyelitis".   Micro Data:  7/2 5 - MRSA PCR - neg 7/25- covid - neg BCX2 7/27>>>  Antimicrobials:  vanc 7/25 - 7/28 Zosyn 7/25 -   Interim history/subjective:  Remains sedated Objective   Blood pressure (Abnormal) 154/96, pulse 100, temperature 98.4 F (36.9 C), temperature source Axillary, resp. rate (Abnormal) 32, height _0  (1.6 m), weight (Abnormal) 126.1 kg, SpO2 96 %.    Vent Mode: PRVC FiO2 (%):  [40 %] 40 % Set Rate:  [24 bmp] 24 bmp Vt Set:  [420 mL] 420 mL PEEP:  [5 cmH20] 5 cmH20 Plateau Pressure:  [20 cmH20-23 cmH20] 23 cmH20   Intake/Output Summary (Last 24 hours) at 11/13/2019 0847 Last data filed at 11/13/2019 0800 Gross per 24 hour  Intake 1994.55 ml  Output 3650 ml  Net -1655.45 ml   Filed Weights   11/12/19 0409 11/13/19 0456  Weight: (Abnormal) 125.9 kg (Abnormal) 126.1 kg    Examination: General obese 69 year old black female currently sedated on propofol infusion  HEENT orally intubated, neck is large difficult to assess jugular venous distention mucous membranes moist sclera nonicteric  Pulmonary: Clear, diminished bilaterally no accessory use currently PEEP 5/FiO2 40%  Cardiac regular rate  and rhythm without murmur rub or gallop  Abdomen obese soft nontender no organomegaly tolerating tube feeds  GU clear yellow  Neuro sedated   Extremities warm dry right lower extremity more edematous than left, discoloration continues to improve, significantly less erythema compared to last 48 hours    Resolved Hospital Problem list     Assessment & Plan:   Acute hypercarbic respiratory failure superimposed on h/o Chronic hypoxic respiratory failure ILD; likely element of OHS/OSA as well -Chronically on 3 L -Became tachypneic once again on pressure support attempt Plan Continue full ventilator support for now, and then reattempt pressure support ventilation later today  Transitioning propofol to Precedex, hopefully this will facilitate SBT attempts Continue IV Lasix  continue full ventilatory support  Change RASS goal to 0, then reinitiate spontaneous breathing trial attempt  IV Lasix today  VAP bundle Follow-up trach aspirate   Severe sepsis 2/2 RLE cellulitis  -CT imaging without evidence of myocytitis, pyomyositis, or drainable fluid collection; marked areas actually improved when comparing film exam on 7/27  plan Keeping IV fluids at [place] day #5, length of therapy to be determined however as all cultures negative I think we can probably stop it 8 -10 days   Fluid and Electrolyte Imbalance: Hypernatremia and hypophosphatemia, hypomagnesemia & hypokalemia   Plan Awaiting a.m. chemistry Continue free water replacement Replace electrolytes as indicated  Acute metabolic encephalopathy: This is improved some prior to intubation however not back to baseline Plan PAD protocol with RASS goal 0 to -1 Supportive care  Worsening thrombocytopenia w/ H/o ITP - followed by Dr Irene Limbo.  Platelets have been dropping slowly as of 7/28  plan Awaiting CBC  Hyperglycemia Plan Continue sliding scale insulin   Best practice:  Diet: npo-->start tubefeeds Pain/Anxiety/Delirium protocol (if indicated): dc benzo and opipoids VAP protocol (if indicated): x DVT prophylaxis: scd GI prophylaxis: per triad Glucose control:  ssi per triad Mobility: bed rest per triad Code Status: full code Family Communication: I updated her sister at bedside Disposition: full code ICU  My cct 33 min  Erick Colace ACNP-BC [place] Pager # [phone] OR # [phone] if no answer       LABS    PULMONARY Recent Labs  Lab 11/10/19 2152 11/11/19 0115 11/11/19 0545 11/11/19 1245  PHART 7.275* 7.264* 7.271* 7.341*  PCO2ART 60.2* 63.9* 68.6* 59.6*  PO2ART 96.4 147* 96 271*  HCO3 26.8 27.9 31.7* 32.2*  TCO2  --   --  34* 34*  O2SAT 96.2 98.5 96.0 100.0    CBC Recent Labs  Lab 11/10/19 0545 11/10/19 0545 11/10/19 2152 11/10/19 2152 11/11/19 0545 11/11/19 1245 11/12/19 0555  HGB 10.8*   < > 11.5*   < > 11.9* 12.6 11.1*  HCT 35.0*   < > 38.1   < > 35.0* 37.0 37.6  WBC 5.4  --  4.5  --   --   --  3.5*  PLT 74*  --  68*  --   --   --  42*   < > = values in this interval not displayed.    COAGULATION Recent Labs  Lab 11/10/19 2152  INR 1.3*    CARDIAC  No results for input(s): TROPONINI in the last 168 hours. No results for input(s): PROBNP in the last 168 hours.   CHEMISTRY Recent Labs  Lab 11/08/19 1705 11/08/19 1705 11/10/19 0545 11/10/19 0545 11/10/19 2152 11/10/19 2152 11/11/19 0055 11/11/19 0055 11/11/19 0545 11/11/19 0545 11/11/19 1203 11/11/19 1245  11/11/19 1628 11/12/19 0555 11/12/19 1633  NA 143   < > 141   < > 141  --  141  --  145  --   --  146*  --  147*  --   K 3.8   < > 3.7   < > 4.0   < > 4.2   < > 4.0   < >  --  4.1  --  3.3*  --   CL 106  --  109  --  108  --  108  --   --   --   --   --   --  113*  --   CO2 27  --  22  --  26  --  27  --   --   --   --   --   --  23  --   GLUCOSE 115*  --  202*  --  139*  137*  --  148*  --   --   --   --   --   --  142*  --   BUN 8  --  10  --  12  --  10  --   --   --   --   --   --  8  --   CREATININE 0.66  --  0.88  --  0.69  --  0.65  --   --   --   --   --   --  0.55  --   CALCIUM 9.9  --  8.6*  --   8.9  --  8.8*  --   --   --   --   --   --  9.1  --   MG  --   --   --   --  2.0  --   --   --   --   --  2.2  --  1.9 1.9 1.6*  PHOS  --   --   --   --  2.1*  --   --   --   --   --  2.7  --  1.4* 1.2* 3.6   < > = values in this interval not displayed.   Estimated Creatinine Clearance: 85.8 mL/min (by C-G formula based on SCr of 0.55 mg/dL).   LIVER Recent Labs  Lab 11/08/19 1705 11/10/19 2152 11/11/19 0055 11/12/19 0555  AST _0 ALT _1 ALKPHOS 64 39 39 35*  BILITOT 2.2* 1.9* 1.9* 0.8  PROT 7.0 6.2* 5.9* 5.4*  ALBUMIN 4.1 3.2* 3.0* 2.7*  INR  --  1.3*  --   --      INFECTIOUS Recent Labs  Lab 11/10/19 0545 11/10/19 1051 11/10/19 2152 11/11/19 0055 11/12/19 0555  LATICACIDVEN 1.4 1.1 1.5  --   --   PROCALCITON  --   --  0.23 0.18 <0.10     ENDOCRINE CBG (last 3)  Recent Labs    11/12/19 2315 11/13/19 0310 11/13/19 0733  GLUCAP 145* 136* 153*

## 2019-11-14 ENCOUNTER — Inpatient Hospital Stay (HOSPITAL_COMMUNITY): Payer: Medicare Other

## 2019-11-14 DIAGNOSIS — J9601 Acute respiratory failure with hypoxia: Secondary | ICD-10-CM

## 2019-11-14 LAB — POCT I-STAT 7, (LYTES, BLD GAS, ICA,H+H)
Acid-Base Excess: 8 mmol/L — ABNORMAL HIGH (ref 0.0–2.0)
Bicarbonate: 32.4 mmol/L — ABNORMAL HIGH (ref 20.0–28.0)
Calcium, Ion: 1.44 mmol/L — ABNORMAL HIGH (ref 1.15–1.40)
HCT: 33 % — ABNORMAL LOW (ref 36.0–46.0)
Hemoglobin: 11.2 g/dL — ABNORMAL LOW (ref 12.0–15.0)
O2 Saturation: 94 %
Patient temperature: 98.6
Potassium: 3.4 mmol/L — ABNORMAL LOW (ref 3.5–5.1)
Sodium: 149 mmol/L — ABNORMAL HIGH (ref 135–145)
TCO2: 34 mmol/L — ABNORMAL HIGH (ref 22–32)
pCO2 arterial: 42.5 mmHg (ref 32.0–48.0)
pH, Arterial: 7.489 — ABNORMAL HIGH (ref 7.350–7.450)
pO2, Arterial: 67 mmHg — ABNORMAL LOW (ref 83.0–108.0)

## 2019-11-14 LAB — GLUCOSE, CAPILLARY
Glucose-Capillary: 199 mg/dL — ABNORMAL HIGH (ref 70–99)
Glucose-Capillary: 201 mg/dL — ABNORMAL HIGH (ref 70–99)
Glucose-Capillary: 153 mg/dL — ABNORMAL HIGH (ref 70–99)
Glucose-Capillary: 183 mg/dL — ABNORMAL HIGH (ref 70–99)
Glucose-Capillary: 192 mg/dL — ABNORMAL HIGH (ref 70–99)
Glucose-Capillary: 168 mg/dL — ABNORMAL HIGH (ref 70–99)

## 2019-11-14 LAB — CBC
HCT: 35.3 % — ABNORMAL LOW (ref 36.0–46.0)
Hemoglobin: 11.6 g/dL — ABNORMAL LOW (ref 12.0–15.0)
MCH: 32.3 pg (ref 26.0–34.0)
MCHC: 32.9 g/dL (ref 30.0–36.0)
MCV: 98.3 fL (ref 80.0–100.0)
Platelets: 59 10*3/uL — ABNORMAL LOW (ref 150–400)
RBC: 3.59 MIL/uL — ABNORMAL LOW (ref 3.87–5.11)
RDW: 18.2 % — ABNORMAL HIGH (ref 11.5–15.5)
WBC: 4 10*3/uL (ref 4.0–10.5)
nRBC: 0 % (ref 0.0–0.2)

## 2019-11-14 LAB — COMPREHENSIVE METABOLIC PANEL
ALT: 14 U/L (ref 0–44)
AST: 18 U/L (ref 15–41)
Albumin: 3 g/dL — ABNORMAL LOW (ref 3.5–5.0)
Alkaline Phosphatase: 41 U/L (ref 38–126)
Anion gap: 12 (ref 5–15)
BUN: 22 mg/dL (ref 8–23)
CO2: 28 mmol/L (ref 22–32)
Calcium: 10.5 mg/dL — ABNORMAL HIGH (ref 8.9–10.3)
Chloride: 106 mmol/L (ref 98–111)
Creatinine, Ser: 0.96 mg/dL (ref 0.44–1.00)
GFR calc Af Amer: >60 mL/min (ref >60)
GFR calc non Af Amer: >60 mL/min (ref >60)
Glucose, Bld: 194 mg/dL — ABNORMAL HIGH (ref 70–99)
Potassium: 3.4 mmol/L — ABNORMAL LOW (ref 3.5–5.1)
Sodium: 146 mmol/L — ABNORMAL HIGH (ref 135–145)
Total Bilirubin: 1.2 mg/dL (ref 0.3–1.2)
Total Protein: 6.2 g/dL — ABNORMAL LOW (ref 6.5–8.1)

## 2019-11-14 LAB — CULTURE, BLOOD (ROUTINE X 2)
Culture: NO GROWTH
Culture: NO GROWTH
Special Requests: ADEQUATE
Special Requests: ADEQUATE

## 2019-11-14 LAB — SEDIMENTATION RATE: Sed Rate: 45 mm/hr — ABNORMAL HIGH (ref 0–22)

## 2019-11-14 LAB — BRAIN NATRIURETIC PEPTIDE: B Natriuretic Peptide: 112.3 pg/mL — ABNORMAL HIGH (ref 0.0–100.0)

## 2019-11-14 LAB — TRIGLYCERIDES: Triglycerides: 132 mg/dL (ref <150)

## 2019-11-14 MED ORDER — POTASSIUM CHLORIDE 20 MEQ/15ML (10%) PO SOLN
40.0000 meq | Freq: Three times a day (TID) | ORAL | Status: AC
  Administered 2019-11-14 (×3): 40 meq
  Filled 2019-11-14 (×3): qty 30

## 2019-11-14 MED ORDER — INSULIN ASPART 100 UNIT/ML ~~LOC~~ SOLN
5.0000 [IU] | SUBCUTANEOUS | Status: DC
  Administered 2019-11-14 – 2019-11-16 (×12): 5 [IU] via SUBCUTANEOUS

## 2019-11-14 MED ORDER — VITAL AF 1.2 CAL PO LIQD
1000.0000 mL | ORAL | Status: DC
  Administered 2019-11-16: 1000 mL

## 2019-11-14 MED ORDER — ADULT MULTIVITAMIN LIQUID CH
15.0000 mL | Freq: Every day | ORAL | Status: DC
  Administered 2019-11-15 – 2019-11-16 (×2): 15 mL
  Filled 2019-11-14 (×2): qty 15

## 2019-11-14 MED ORDER — DEXTROSE 5 % IV SOLN: INTRAVENOUS | Status: DC

## 2019-11-14 MED ORDER — PROSOURCE TF PO LIQD
45.0000 mL | Freq: Two times a day (BID) | ORAL | Status: DC
  Administered 2019-11-14 – 2019-11-16 (×5): 45 mL
  Filled 2019-11-14 (×5): qty 45

## 2019-11-14 MED ORDER — FREE WATER
300.0000 mL | Status: DC
  Administered 2019-11-14 – 2019-11-15 (×8): 300 mL

## 2019-11-14 NOTE — Progress Notes (Signed)
NAME:  Kathy Irwin, MRN:  [id number], DOB:  Jan 29, 1951, LOS: 5 ADMISSION DATE:  11/08/2019, CONSULTATION DATE:  11/10/19 REFERRING MD:  Triad MD, CHIEF COMPLAINT:  Acute encephaolpathy in setting of sepsis, ILD and phenergan   Brief History   69 year old female with obesity, chronic diastolic failure, ITP, chronic hypoxemic respiratory failure on 3 L nasal cannula at baseline secondary to interstitial lung disease followed by Dr. Vaughan Browner in the ILD clinic at pulmonary clinic.  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  History of COVID-19 in September 2020  ->admitted 7/25 w/ RLE cellulitis in spite of 10d Cephalexin also cc: N/V. PCCM asked to see 7/26 when developed worsening delirium   Past Medical History     has a past medical history of Acute respiratory failure with hypoxia ([place]) (12/2018), Diabetes mellitus without complication ([place]), Hypersensitivity pneumonitis ([place]) (12/19/2017), Hypertension, and ILD (interstitial lung disease) ([place]) (12/24/2018).   reports that she has never smoked. She has never used smokeless tobacco.    Significant Hospital Events   11/09/2019/admit: w/ working dx of cellulitis. Lethargic. CT head ordered. ABX started.  7/26 desaturated. sats 70s. BP dropping. Received IVF bolus.  Developed worsening delirium, actually received Ativan for this.  Critical care consulted later for delirium, which later became more lethargy and ongoing fever, moved to the intensive care. 7/27 intubated-->still w/ decreased MS and hypercarbia. Also trying to facilitate CT imaging which showed: "1. Moderate diffuse skin thickening and subcutaneous soft tissue swelling/edema/fluid suggesting cellulitis. No discrete fluid collection to suggest a drainable soft tissue abscess. 2. No CT findings to suggest myofasciitis or pyomyositis.3. No CT findings suspicious for septic arthritis or osteomyelitis". Was briefly pressor dependent  7/28 discoloration of right LE a  little better.  7/29: Failed spontaneous breathing trial became tachypneic.  Still 1.2 L positive since arrival to the intensive care.  Continuing IV diuresis.  Changing propofol to Precedex 7/30 awake. now w/ negative fluid balance. Tolerating PSV of 15 but not ready for extubation. Film actually a little worse. Leg clinically much better. Sed rate and BNP sent to decide of trial of steroids.  Consults:  11/10/2019-critical care medicine consult  Procedures:  oett 7/27>>>  Significant Diagnostic Tests:  CT right LE 7/27>>>"1. Moderate diffuse skin thickening and subcutaneous soft tissue swelling/edema/fluid suggesting cellulitis. No discrete fluid collection to suggest a drainable soft tissue abscess. 2. No CT findings to suggest myofasciitis or pyomyositis.3. No CT findings suspicious for septic arthritis or osteomyelitis".   Micro Data:  7/2 5 - MRSA PCR - neg 7/25- covid - neg BCX2 7/27>>>  Antimicrobials:  vanc 7/25 - 7/28 Zosyn 7/25 -   Interim history/subjective:  More awake. Tolerating PSV Objective   Blood pressure (Abnormal) 146/72, pulse 74, temperature 98.6 F (37 C), temperature source Axillary, resp. rate (Abnormal) 28, height 5' 3" (1.6 m), weight (Abnormal) 124.2 kg, SpO2 100 %.    Vent Mode: PRVC FiO2 (%):  [40 %] 40 % Set Rate:  [24 bmp] 24 bmp Vt Set:  [420 mL] 420 mL PEEP:  [5 cmH20] 5 cmH20 Plateau Pressure:  [20 cmH20-21 cmH20] 21 cmH20   Intake/Output Summary (Last 24 hours) at 11/14/2019 0905 Last data filed at 11/14/2019 0900 Gross per 24 hour  Intake 2998.87 ml  Output 4600 ml  Net -1601.13 ml   Filed Weights   11/12/19 0409 11/13/19 0456 11/14/19 0500  Weight: (Abnormal) 125.9 kg (Abnormal) 126.1 kg (Abnormal) 124.2 kg    Examination:  69 year old black female. She is currently on PSV and interactive HENT NCAT no JVD MMM orally intubated  Pulm scattered rhonchi, dec bases bilaterally currently on PSV of 15/peep5 RR 30s, no  accessory use. sats high 90s VTs mid 300s Card RRR no MRG abd obese + bowel sounds tol tubefeeds GU cl yellow Neuro off precedex currently. Moves ext. Follows commands Ext warm dry. Right LE swelling and discoloration is continuing to subside and regress daily.    Resolved Hospital Problem list     Assessment & Plan:   Acute hypercarbic respiratory failure superimposed on h/o Chronic hypoxic respiratory failure ILD; likely element of OHS/OSA as well -Chronically on 3 L -PCXR personally reviewed showing worsening bilateral/bibasilar airspace disease when comparing film from 2 days ago -she is neg 100 ml -sputum neg.  -tol PSV of 15 but not ready for extubation.  Plan Cont full vent support and PSV as tolerated w/ daily assessment for SBT Cont VAP bundle PAD protocol w/ RASS goal 0 to -1 (precedex off currently but will keep on profile if needed) Cont IV lasix Checking BNP and sed rate. She has been steroid responsive in past from an ILD stand-point. If sed rate sig elevated will place her on systemic steroids.    Severe sepsis 2/2 RLE cellulitis  -CT imaging without evidence of myocytitis, pyomyositis, or drainable fluid collection; marked areas actually improved when comparing film exam on 7/27  plan Zosyn day 6 of 8   Fluid and Electrolyte Imbalance: Hypernatremia and  hypokalemia   Plan Adjust free water Replace K Am chemistry   Acute metabolic encephalopathy:This is improving daily  Plan PAD protocol  RASS goal 0 to -1  Worsening thrombocytopenia w/ H/o ITP - followed by Dr Irene Limbo.  Platelets have been dropping slowly as of 7/28  plan Trend cbc  Hyperglycemia Plan ssi w/ basal dosing   Best practice:  Diet: npo-->start tubefeeds Pain/Anxiety/Delirium protocol (if indicated): dc benzo and opipoids VAP protocol (if indicated): x DVT prophylaxis: scd GI prophylaxis: per triad Glucose control: ssi per triad Mobility: bed rest per triad Code Status: full  code Family Communication: I updated her sister at bedside Disposition: full code ICU  33 min   Erick Colace ACNP-BC [place] Pager # [phone] OR # [phone] if no answer      LABS    PULMONARY Recent Labs  Lab 11/10/19 2152 11/11/19 0115 11/11/19 0545 11/11/19 1245 11/14/19 0307  PHART 7.275* 7.264* 7.271* 7.341* 7.489*  PCO2ART 60.2* 63.9* 68.6* 59.6* 42.5  PO2ART 96.4 147* 96 271* 67*  HCO3 26.8 27.9 31.7* 32.2* 32.4*  TCO2  --   --  34* 34* 34*  O2SAT 96.2 98.5 96.0 100.0 94.0    CBC Recent Labs  Lab 11/12/19 0555 11/12/19 0555 11/13/19 1003 11/14/19 0143 11/14/19 0307  HGB 11.1*   < > 10.5* 11.6* 11.2*  HCT 37.6   < > 33.0* 35.3* 33.0*  WBC 3.5*  --  3.5* 4.0  --   PLT 42*  --  52* 59*  --    < > = values in this interval not displayed.    COAGULATION Recent Labs  Lab 11/10/19 2152  INR 1.3*    CARDIAC  No results for input(s): TROPONINI in the last 168 hours. No results for input(s): PROBNP in the last 168 hours.   CHEMISTRY Recent Labs  Lab 11/10/19 2152 11/10/19 2152 11/11/19 0055 11/11/19 0545 11/11/19 1203 11/11/19 1245 11/11/19  1245 11/11/19 1628 11/12/19 0555 11/12/19 0555 11/12/19 1633 11/13/19 0854 11/13/19 0854 11/14/19 0143 11/14/19 0307  NA 141   < > 141   < >  --  146*  --   --  147*  --   --  146*  --  146* 149*  K 4.0   < > 4.2   < >  --  4.1   < >  --  3.3*   < >  --  3.5   < > 3.4* 3.4*  CL 108  --  108  --   --   --   --   --  113*  --   --  109  --  106  --   CO2 26  --  27  --   --   --   --   --  23  --   --  25  --  28  --   GLUCOSE 139*  137*  --  148*  --   --   --   --   --  142*  --   --  147*  --  194*  --   BUN 12  --  10  --   --   --   --   --  8  --   --  16  --  22  --   CREATININE 0.69  --  0.65  --   --   --   --   --  0.55  --   --  0.68  --  0.96  --   CALCIUM 8.9  --  8.8*  --   --   --   --   --  9.1  --   --  9.8  --  10.5*  --   MG 2.0  --   --   --  2.2  --    --  1.9 1.9  --  1.6*  --   --   --   --   PHOS 2.1*  --   --   --  2.7  --   --  1.4* 1.2*  --  3.6  --   --   --   --    < > = values in this interval not displayed.   Estimated Creatinine Clearance: 70.8 mL/min (by C-G formula based on SCr of 0.96 mg/dL).   LIVER Recent Labs  Lab 11/08/19 1705 11/10/19 2152 11/11/19 0055 11/12/19 0555 11/14/19 0143  AST _0 ALT _1 ALKPHOS 64 39 39 35* 41  BILITOT 2.2* 1.9* 1.9* 0.8 1.2  PROT 7.0 6.2* 5.9* 5.4* 6.2*  ALBUMIN 4.1 3.2* 3.0* 2.7* 3.0*  INR  --  1.3*  --   --   --      INFECTIOUS Recent Labs  Lab 11/10/19 0545 11/10/19 1051 11/10/19 2152 11/11/19 0055 11/12/19 0555  LATICACIDVEN 1.4 1.1 1.5  --   --   PROCALCITON  --   --  0.23 0.18 <0.10     ENDOCRINE CBG (last 3)  Recent Labs    11/13/19 2317 11/14/19 0328 11/14/19 0726  GLUCAP 198* 199* 201*

## 2019-11-14 NOTE — Progress Notes (Signed)
Nutrition Follow-up  DOCUMENTATION CODES:   Morbid obesity   INTERVENTION:   Continue tube feeding via OG tube: Change to Vital AF 1.2 at goal rate of 55 ml/h (1320 ml per day) Prosource TF 45 ml BID  Provides 1664 kcal, 121 gm protein, 1071 ml free water daily  NUTRITION DIAGNOSIS:   Inadequate oral intake related to inability to eat as evidenced by NPO status.  Ongoing  GOAL:   Provide needs based on ASPEN/SCCM guidelines  Met with TF  MONITOR:   Vent status, TF tolerance, Labs, Skin  REASON FOR ASSESSMENT:   Ventilator, Consult Enteral/tube feeding initiation and management  ASSESSMENT:   69 yo female admitted with septic shock, cellulitis R leg. PMH includes ILD-baseline 3 L [place], obesity, ITP, HF, COVID-19.   Discussed patient in ICU rounds and with RN today. Propofol has been discontinued. Currently on Precedex.  Patient remains intubated on ventilator support. MV: 10.3 L/min Temp (24hrs), Avg:98.6 F (37 C), Min:98.2 F (36.8 C), Max:98.8 F (37.1 C) Propofol: off  Labs reviewed. Sodium 149, potassium 3.4 CBG: [id number]  Medications reviewed and include colace, lasix, novolog, liquid MVI, KCl, miralax.   Diet Order:   Diet Order            Diet NPO time specified  Diet effective now                 EDUCATION NEEDS:   Not appropriate for education at this time  Skin:  Skin Assessment: Reviewed RN Assessment (R leg cellulitis)  Last BM:  7/30  Height:   Ht Readings from Last 1 Encounters:  11/11/19 _0  (1.6 m)    Weight:   Wt Readings from Last 1 Encounters:  11/14/19 (!) 124.2 kg    Ideal Body Weight:  52.3 kg  BMI:  Body mass index is 48.5 kg/m.  Estimated Nutritional Needs:   Kcal:  9643-8381  Protein:  110-130 gm  Fluid:  >/= 1.7 L    Lucas Mallow, RD, LDN, CNSC Please refer to Amion for contact information.

## 2019-11-14 NOTE — Plan of Care (Signed)
  Problem: Clinical Measurements: Goal: Cardiovascular complication will be avoided Outcome: Progressing   Problem: Nutrition: Goal: Adequate nutrition will be maintained Outcome: Progressing Note: Pt is tolerating TF well at goal.    Problem: Elimination: Goal: Will not experience complications related to bowel motility Outcome: Progressing Goal: Will not experience complications related to urinary retention Outcome: Progressing   Problem: Respiratory: Goal: Ability to maintain a clear airway and adequate ventilation will improve Outcome: Progressing   Problem: Clinical Measurements: Goal: Respiratory complications will improve 11/14/2019 0845 by Netta Corrigan, RN Outcome: Not Progressing Note: Although patient is tolerating ventilator well, pt is very alert this morning despite precedex being off.  11/14/2019 0836 by Netta Corrigan, RN Outcome: Not Progressing   Problem: Activity: Goal: Risk for activity intolerance will decrease Outcome: Not Progressing Note: Pt on ventilator. Unable to mobilize at this time.   Problem: Activity: Goal: Ability to tolerate increased activity will improve Outcome: Not Progressing

## 2019-11-15 LAB — COMPREHENSIVE METABOLIC PANEL
ALT: 17 U/L (ref 0–44)
AST: 20 U/L (ref 15–41)
Albumin: 2.9 g/dL — ABNORMAL LOW (ref 3.5–5.0)
Alkaline Phosphatase: 46 U/L (ref 38–126)
Anion gap: 12 (ref 5–15)
BUN: 41 mg/dL — ABNORMAL HIGH (ref 8–23)
CO2: 27 mmol/L (ref 22–32)
Calcium: 10.7 mg/dL — ABNORMAL HIGH (ref 8.9–10.3)
Chloride: 108 mmol/L (ref 98–111)
Creatinine, Ser: 1.26 mg/dL — ABNORMAL HIGH (ref 0.44–1.00)
GFR calc Af Amer: 50 mL/min — ABNORMAL LOW (ref >60)
GFR calc non Af Amer: 43 mL/min — ABNORMAL LOW (ref >60)
Glucose, Bld: 197 mg/dL — ABNORMAL HIGH (ref 70–99)
Potassium: 4 mmol/L (ref 3.5–5.1)
Sodium: 147 mmol/L — ABNORMAL HIGH (ref 135–145)
Total Bilirubin: 0.9 mg/dL (ref 0.3–1.2)
Total Protein: 6.3 g/dL — ABNORMAL LOW (ref 6.5–8.1)

## 2019-11-15 LAB — GLUCOSE, CAPILLARY
Glucose-Capillary: 146 mg/dL — ABNORMAL HIGH (ref 70–99)
Glucose-Capillary: 181 mg/dL — ABNORMAL HIGH (ref 70–99)
Glucose-Capillary: 190 mg/dL — ABNORMAL HIGH (ref 70–99)
Glucose-Capillary: 217 mg/dL — ABNORMAL HIGH (ref 70–99)
Glucose-Capillary: 271 mg/dL — ABNORMAL HIGH (ref 70–99)
Glucose-Capillary: 297 mg/dL — ABNORMAL HIGH (ref 70–99)

## 2019-11-15 LAB — CBC
HCT: 34.5 % — ABNORMAL LOW (ref 36.0–46.0)
Hemoglobin: 11.2 g/dL — ABNORMAL LOW (ref 12.0–15.0)
MCH: 32.1 pg (ref 26.0–34.0)
MCHC: 32.5 g/dL (ref 30.0–36.0)
MCV: 98.9 fL (ref 80.0–100.0)
Platelets: 59 10*3/uL — ABNORMAL LOW (ref 150–400)
RBC: 3.49 MIL/uL — ABNORMAL LOW (ref 3.87–5.11)
RDW: 18.3 % — ABNORMAL HIGH (ref 11.5–15.5)
WBC: 6.5 10*3/uL (ref 4.0–10.5)
nRBC: 0.3 % — ABNORMAL HIGH (ref 0.0–0.2)

## 2019-11-15 LAB — TRIGLYCERIDES: Triglycerides: 135 mg/dL (ref <150)

## 2019-11-15 MED ORDER — PREDNISONE 10 MG PO TABS
30.0000 mg | ORAL_TABLET | Freq: Every day | ORAL | Status: DC
  Administered 2019-11-15 – 2019-11-16 (×2): 30 mg
  Filled 2019-11-15: qty 3
  Filled 2019-11-15: qty 6

## 2019-11-15 MED ORDER — FREE WATER
400.0000 mL | Status: DC
  Administered 2019-11-15 – 2019-11-16 (×7): 400 mL

## 2019-11-15 NOTE — Progress Notes (Signed)
NAME:  Kathy Irwin, MRN:  [id number], DOB:  01-Oct-1950, LOS: 6 ADMISSION DATE:  11/08/2019, CONSULTATION DATE:  11/10/19 REFERRING MD:  Triad MD, CHIEF COMPLAINT:  Acute encephaolpathy in setting of sepsis, ILD and phenergan   Brief History   69 year old female with obesity, chronic diastolic failure, ITP, chronic hypoxemic respiratory failure on 3 L nasal cannula at baseline secondary to interstitial lung disease followed by Dr. Vaughan Browner in the ILD clinic at pulmonary clinic.  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  History of COVID-19 in September 2020  ->admitted 7/25 w/ RLE cellulitis in spite of 10d Cephalexin also cc: N/V. PCCM asked to see 7/26 when developed worsening delirium   Past Medical History     has a past medical history of Acute respiratory failure with hypoxia ([place]) (12/2018), Diabetes mellitus without complication ([place]), Hypersensitivity pneumonitis ([place]) (12/19/2017), Hypertension, and ILD (interstitial lung disease) ([place]) (12/24/2018).   reports that she has never smoked. She has never used smokeless tobacco.    Significant Hospital Events   11/09/2019/admit: w/ working dx of cellulitis. Lethargic. CT head ordered. ABX started.  7/26 desaturated. sats 70s. BP dropping. Received IVF bolus.  Developed worsening delirium, actually received Ativan for this.  Critical care consulted later for delirium, which later became more lethargy and ongoing fever, moved to the intensive care. 7/27 intubated-->still w/ decreased MS and hypercarbia. Also trying to facilitate CT imaging which showed: "1. Moderate diffuse skin thickening and subcutaneous soft tissue swelling/edema/fluid suggesting cellulitis. No discrete fluid collection to suggest a drainable soft tissue abscess. 2. No CT findings to suggest myofasciitis or pyomyositis.3. No CT findings suspicious for septic arthritis or osteomyelitis". Was briefly pressor dependent  7/28 discoloration of right LE a  little better.  7/29: Failed spontaneous breathing trial became tachypneic.  Still 1.2 L positive since arrival to the intensive care.  Continuing IV diuresis.  Changing propofol to Precedex 7/30 awake. now w/ negative fluid balance. Tolerating PSV of 15 but not ready for extubation. Film actually a little worse. Leg clinically much better. Sed rate and BNP sent to decide of trial of steroids.  Consults:  11/10/2019-critical care medicine consult  Procedures:  oett 7/27>>>  Significant Diagnostic Tests:  CT right LE 7/27>>>"1. Moderate diffuse skin thickening and subcutaneous soft tissue swelling/edema/fluid suggesting cellulitis. No discrete fluid collection to suggest a drainable soft tissue abscess. 2. No CT findings to suggest myofasciitis or pyomyositis.3. No CT findings suspicious for septic arthritis or osteomyelitis".   Micro Data:  7/2 5 - MRSA PCR - neg 7/25- covid - neg BCX2 7/27>>>  Antimicrobials:  vanc 7/25 - 7/28 Zosyn 7/25 -   Interim history/subjective:   Tolerating pressure support 15, Precedex 0.6 FiO2 0.40, PEEP 5 Serum creatinine 1.26   Objective   Blood pressure 126/72, pulse 87, temperature 98.3 F (36.8 C), temperature source Oral, resp. rate (!) 34, height _0  (1.6 m), weight (!) 121 kg, SpO2 96 %.    Vent Mode: CPAP;PSV FiO2 (%):  [40 %] 40 % Set Rate:  [24 bmp] 24 bmp Vt Set:  [420 mL] 420 mL PEEP:  [5 cmH20] 5 cmH20 Pressure Support:  [15 cmH20] 15 cmH20 Plateau Pressure:  [18 cmH20-22 cmH20] 18 cmH20   Intake/Output Summary (Last 24 hours) at 11/15/2019 1231 Last data filed at 11/15/2019 1200 Gross per 24 hour  Intake 3519.53 ml  Output 3125 ml  Net 394.53 ml   Filed Weights   11/13/19 0456 11/14/19  0500 11/15/19 0500  Weight: (!) 126.1 kg (!) 124.2 kg (!) 121 kg    Examination: General ill-appearing woman, ventilated HENT ET tube in good position, pupils equal Pulm tolerating PSV, good tidal volumes, clear breath sounds, no  wheezing Card regular, no murmur abd obese, soft, nondistended, positive bowel sounds Neuro opens eyes, follows commands, nods to questions on Precedex 0.6 Ext right lower extremity is dark but no erythema or purulent drainage, minimal edema   Resolved Hospital Problem list     Assessment & Plan:   Acute hypercarbic respiratory failure superimposed on h/o Chronic hypoxic respiratory failure ILD; likely element of OHS/OSA as well -Chronically on 3 L -sputum neg.  -Tolerating pressure support currently Plan Continue post pressure port as she can tolerate, wean the PS as able.  Not clear that she is ready for extubation yet given her target volumes on PS 15 VAP prevention order set Attempted minimize sedation, using Precedex Can tolerate, note rising SCr on 7/31 ESR 45.  She has had steroid responsive ILD in the past and would be reasonable to treat with corticosteroids.  Will start prednisone 30 mg daily, follow respiratory status and chest x-ray   Severe sepsis 2/2 RLE cellulitis  -CT imaging without evidence of myocytitis, pyomyositis, or drainable fluid collection; marked areas actually improved when comparing film exam on 7/27  plan Zosyn day 7 of 8   Fluid and Electrolyte Imbalance: Hypernatremia and  hypokalemia   Plan Increase free water 7/31 Replace potassium as indicated Follow BMP  Acute metabolic encephalopathy: Improving Plan Continue PAD protocol  Continue RASS goal 0 to -1  Worsening thrombocytopenia w/ H/o ITP - followed by Dr Irene Limbo.  Platelets have been dropping slowly as of 7/28  plan Follow CBC  Hyperglycemia Plan Sliding-scale insulin as per protocol  Best practice:  Diet: Tube feeding Pain/Anxiety/Delirium protocol (if indicated): dc benzo and opipoids VAP protocol (if indicated): x DVT prophylaxis: Enoxaparin GI prophylaxis: Pepcid Glucose control: ssi Mobility: bed rest Code Status: full code Family Communication: Updated the patient's  sister at bedside 7/31 Disposition: full code ICU  Independent critical care time 33 minutes  Baltazar Apo, MD, PhD 11/15/2019, 12:41 PM [facility] [phone] or if no answer [phone]     LABS    PULMONARY Recent Labs  Lab 11/10/19 2152 11/11/19 0115 11/11/19 0545 11/11/19 1245 11/14/19 0307  PHART 7.275* 7.264* 7.271* 7.341* 7.489*  PCO2ART 60.2* 63.9* 68.6* 59.6* 42.5  PO2ART 96.4 147* 96 271* 67*  HCO3 26.8 27.9 31.7* 32.2* 32.4*  TCO2  --   --  34* 34* 34*  O2SAT 96.2 98.5 96.0 100.0 94.0    CBC Recent Labs  Lab 11/13/19 1003 11/13/19 1003 11/14/19 0143 11/14/19 0307 11/15/19 0114  HGB 10.5*   < > 11.6* 11.2* 11.2*  HCT 33.0*   < > 35.3* 33.0* 34.5*  WBC 3.5*  --  4.0  --  6.5  PLT 52*  --  59*  --  59*   < > = values in this interval not displayed.    COAGULATION Recent Labs  Lab 11/10/19 2152  INR 1.3*    CARDIAC  No results for input(s): TROPONINI in the last 168 hours. No results for input(s): PROBNP in the last 168 hours.   CHEMISTRY Recent Labs  Lab 11/10/19 2152 11/10/19 2152 11/11/19 0055 11/11/19 0545 11/11/19 1203 11/11/19 1245 11/11/19 1628 11/12/19 0555 11/12/19 9390 11/12/19 1633 11/13/19 3009 11/13/19 2330 11/14/19 0143 11/14/19 0143 11/14/19 0762  11/15/19 0114  NA 141   < > 141   < >  --    < >  --  147*  --   --  146*  --  146*  --  149* 147*  K 4.0   < > 4.2   < >  --    < >  --  3.3*   < >  --  3.5   < > 3.4*   < > 3.4* 4.0  CL 108   < > 108  --   --   --   --  113*  --   --  109  --  106  --   --  108  CO2 26   < > 27  --   --   --   --  23  --   --  25  --  28  --   --  27  GLUCOSE 139*  137*   < > 148*  --   --   --   --  142*  --   --  147*  --  194*  --   --  197*  BUN 12   < > 10  --   --   --   --  8  --   --  16  --  22  --   --  41*  CREATININE 0.69   < > 0.65  --   --   --   --  0.55  --   --  0.68  --  0.96  --   --  1.26*  CALCIUM 8.9   < > 8.8*  --   --   --   --  9.1   --   --  9.8  --  10.5*  --   --  10.7*  MG 2.0  --   --   --  2.2  --  1.9 1.9  --  1.6*  --   --   --   --   --   --   PHOS 2.1*  --   --   --  2.7  --  1.4* 1.2*  --  3.6  --   --   --   --   --   --    < > = values in this interval not displayed.   Estimated Creatinine Clearance: 53.1 mL/min (A) (by C-G formula based on SCr of 1.26 mg/dL (H)).   LIVER Recent Labs  Lab 11/10/19 2152 11/11/19 0055 11/12/19 0555 11/14/19 0143 11/15/19 0114  AST _0 ALT _1 ALKPHOS 39 39 35* 41 46  BILITOT 1.9* 1.9* 0.8 1.2 0.9  PROT 6.2* 5.9* 5.4* 6.2* 6.3*  ALBUMIN 3.2* 3.0* 2.7* 3.0* 2.9*  INR 1.3*  --   --   --   --      INFECTIOUS Recent Labs  Lab 11/10/19 0545 11/10/19 1051 11/10/19 2152 11/11/19 0055 11/12/19 0555  LATICACIDVEN 1.4 1.1 1.5  --   --   PROCALCITON  --   --  0.23 0.18 <0.10     ENDOCRINE CBG (last 3)  Recent Labs    11/15/19 0316 11/15/19 0817 11/15/19 1130  GLUCAP 146* 190* 181*

## 2019-11-15 NOTE — Plan of Care (Signed)
  Problem: Education: Goal: Knowledge of General Education information will improve Description: Including pain rating scale, medication(s)/side effects and non-pharmacologic comfort measures Outcome: Progressing   Problem: Health Behavior/Discharge Planning: Goal: Ability to manage health-related needs will improve Outcome: Progressing   Problem: Clinical Measurements: Goal: Ability to maintain clinical measurements within normal limits will improve Outcome: Progressing Goal: Will remain free from infection Outcome: Progressing Goal: Diagnostic test results will improve Outcome: Progressing Goal: Respiratory complications will improve Outcome: Progressing Goal: Cardiovascular complication will be avoided Outcome: Progressing   Problem: Activity: Goal: Risk for activity intolerance will decrease Outcome: Progressing   Problem: Nutrition: Goal: Adequate nutrition will be maintained Outcome: Progressing   Problem: Coping: Goal: Level of anxiety will decrease Outcome: Progressing   Problem: Elimination: Goal: Will not experience complications related to bowel motility Outcome: Progressing Goal: Will not experience complications related to urinary retention Outcome: Progressing   Problem: Pain Managment: Goal: General experience of comfort will improve Outcome: Progressing   Problem: Safety: Goal: Ability to remain free from injury will improve Outcome: Progressing   Problem: Skin Integrity: Goal: Risk for impaired skin integrity will decrease Outcome: Progressing   Problem: Activity: Goal: Ability to tolerate increased activity will improve Outcome: Progressing   Problem: Respiratory: Goal: Ability to maintain a clear airway and adequate ventilation will improve Outcome: Progressing   Problem: Role Relationship: Goal: Method of communication will improve Outcome: Progressing   Problem: Clinical Measurements: Goal: Ability to avoid or minimize complications  of infection will improve Outcome: Progressing   Problem: Skin Integrity: Goal: Skin integrity will improve Outcome: Progressing

## 2019-11-16 LAB — BASIC METABOLIC PANEL
Anion gap: 17 — ABNORMAL HIGH (ref 5–15)
BUN: 59 mg/dL — ABNORMAL HIGH (ref 8–23)
CO2: 26 mmol/L (ref 22–32)
Calcium: 10.2 mg/dL (ref 8.9–10.3)
Chloride: 100 mmol/L (ref 98–111)
Creatinine, Ser: 1.71 mg/dL — ABNORMAL HIGH (ref 0.44–1.00)
GFR calc Af Amer: 35 mL/min — ABNORMAL LOW (ref >60)
GFR calc non Af Amer: 30 mL/min — ABNORMAL LOW (ref >60)
Glucose, Bld: 292 mg/dL — ABNORMAL HIGH (ref 70–99)
Potassium: 3.4 mmol/L — ABNORMAL LOW (ref 3.5–5.1)
Sodium: 143 mmol/L (ref 135–145)

## 2019-11-16 LAB — GLUCOSE, CAPILLARY
Glucose-Capillary: 172 mg/dL — ABNORMAL HIGH (ref 70–99)
Glucose-Capillary: 246 mg/dL — ABNORMAL HIGH (ref 70–99)
Glucose-Capillary: 288 mg/dL — ABNORMAL HIGH (ref 70–99)
Glucose-Capillary: 201 mg/dL — ABNORMAL HIGH (ref 70–99)
Glucose-Capillary: 197 mg/dL — ABNORMAL HIGH (ref 70–99)
Glucose-Capillary: 176 mg/dL — ABNORMAL HIGH (ref 70–99)

## 2019-11-16 MED ORDER — CHLORHEXIDINE GLUCONATE 0.12 % MT SOLN
15.0000 mL | Freq: Two times a day (BID) | OROMUCOSAL | Status: DC
  Administered 2019-11-16 – 2019-11-30 (×26): 15 mL via OROMUCOSAL
  Filled 2019-11-16 (×25): qty 15

## 2019-11-16 MED ORDER — DOCUSATE SODIUM 100 MG PO CAPS
100.0000 mg | ORAL_CAPSULE | Freq: Two times a day (BID) | ORAL | Status: DC
  Administered 2019-11-16 – 2019-11-19 (×5): 100 mg via ORAL
  Filled 2019-11-16 (×5): qty 1

## 2019-11-16 MED ORDER — PREDNISONE 10 MG PO TABS
30.0000 mg | ORAL_TABLET | Freq: Every day | ORAL | Status: DC
  Administered 2019-11-17: 30 mg via ORAL
  Filled 2019-11-16: qty 3

## 2019-11-16 MED ORDER — SERTRALINE HCL 100 MG PO TABS
100.0000 mg | ORAL_TABLET | Freq: Every day | ORAL | Status: DC
  Administered 2019-11-16 – 2019-11-30 (×14): 100 mg via ORAL
  Filled 2019-11-16: qty 2
  Filled 2019-11-16 (×13): qty 1

## 2019-11-16 MED ORDER — DEXMEDETOMIDINE HCL IN NACL 400 MCG/100ML IV SOLN
0.4000 ug/kg/h | INTRAVENOUS | Status: DC
  Administered 2019-11-16: 0.4 ug/kg/h via INTRAVENOUS
  Administered 2019-11-16: 0.5 ug/kg/h via INTRAVENOUS
  Administered 2019-11-16: 0.4 ug/kg/h via INTRAVENOUS
  Filled 2019-11-16 (×2): qty 100

## 2019-11-16 MED ORDER — ORAL CARE MOUTH RINSE
15.0000 mL | Freq: Two times a day (BID) | OROMUCOSAL | Status: DC
  Administered 2019-11-17 – 2019-12-01 (×18): 15 mL via OROMUCOSAL

## 2019-11-16 MED ORDER — ADULT MULTIVITAMIN W/MINERALS CH
1.0000 | ORAL_TABLET | Freq: Every day | ORAL | Status: DC
  Administered 2019-11-17 – 2019-12-01 (×13): 1 via ORAL
  Filled 2019-11-16 (×15): qty 1

## 2019-11-16 MED ORDER — POTASSIUM CHLORIDE 10 MEQ/100ML IV SOLN
10.0000 meq | INTRAVENOUS | Status: AC
  Administered 2019-11-16 (×4): 10 meq via INTRAVENOUS
  Filled 2019-11-16 (×4): qty 100

## 2019-11-16 MED ORDER — FAMOTIDINE 20 MG PO TABS
40.0000 mg | ORAL_TABLET | Freq: Every day | ORAL | Status: DC
  Administered 2019-11-17 – 2019-12-01 (×14): 40 mg via ORAL
  Filled 2019-11-16 (×15): qty 2

## 2019-11-16 NOTE — Progress Notes (Signed)
RT walked in the room due to ventilator alarming and patient had self extubated. Patient trying to talk and does not want the tube back in but is in no apparent distress. 4L nasal cannula applied. RN and MD at bedside. Vent on standby in room. RT will continue to monitor.

## 2019-11-16 NOTE — Progress Notes (Signed)
NAME:  Kathy Irwin, MRN:  [id number], DOB:  July 01, 1950, LOS: 7 ADMISSION DATE:  11/08/2019, CONSULTATION DATE:  11/10/19 REFERRING MD:  Triad MD, CHIEF COMPLAINT:  Acute encephaolpathy in setting of sepsis, ILD and phenergan   Brief History   69 year old female with obesity, chronic diastolic failure, ITP, chronic hypoxemic respiratory failure on 3 L nasal cannula at baseline secondary to interstitial lung disease followed by Dr. Vaughan Browner in the ILD clinic at pulmonary clinic.  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  History of COVID-19 in September 2020  ->admitted 7/25 w/ RLE cellulitis in spite of 10d Cephalexin also cc: N/V. PCCM asked to see 7/26 when developed worsening delirium   Past Medical History     has a past medical history of Acute respiratory failure with hypoxia ([place]) (12/2018), Diabetes mellitus without complication ([place]), Hypersensitivity pneumonitis ([place]) (12/19/2017), Hypertension, and ILD (interstitial lung disease) ([place]) (12/24/2018).   reports that she has never smoked. She has never used smokeless tobacco.    Significant Hospital Events   11/09/2019/admit: w/ working dx of cellulitis. Lethargic. CT head ordered. ABX started.  7/26 desaturated. sats 70s. BP dropping. Received IVF bolus.  Developed worsening delirium, actually received Ativan for this.  Critical care consulted later for delirium, which later became more lethargy and ongoing fever, moved to the intensive care. 7/27 intubated-->still w/ decreased MS and hypercarbia. Also trying to facilitate CT imaging which showed: "1. Moderate diffuse skin thickening and subcutaneous soft tissue swelling/edema/fluid suggesting cellulitis. No discrete fluid collection to suggest a drainable soft tissue abscess. 2. No CT findings to suggest myofasciitis or pyomyositis.3. No CT findings suspicious for septic arthritis or osteomyelitis". Was briefly pressor dependent  7/28 discoloration of right LE a  little better.  7/29: Failed spontaneous breathing trial became tachypneic.  Still 1.2 L positive since arrival to the intensive care.  Continuing IV diuresis.  Changing propofol to Precedex 7/30 awake. now w/ negative fluid balance. Tolerating PSV of 15 but not ready for extubation. Film actually a little worse. Leg clinically much better. Sed rate and BNP sent to decide of trial of steroids.  Consults:  11/10/2019-critical care medicine consult  Procedures:  oett 7/27>>> 8/1  Significant Diagnostic Tests:  CT right LE 7/27>>>"1. Moderate diffuse skin thickening and subcutaneous soft tissue swelling/edema/fluid suggesting cellulitis. No discrete fluid collection to suggest a drainable soft tissue abscess. 2. No CT findings to suggest myofasciitis or pyomyositis.3. No CT findings suspicious for septic arthritis or osteomyelitis".   Micro Data:  7/2 5 - MRSA PCR - neg 7/25- covid - neg BCX2 7/27>>>  Antimicrobials:  vanc 7/25 - 7/28 Zosyn 7/25 -   Interim history/subjective:   She was tolerating some pressure support this morning on Precedex. Self extubated 11: 25 on 8/1, some agitation initially but improved   Objective   Blood pressure (!) 148/96, pulse 81, temperature 99.1 F (37.3 C), temperature source Axillary, resp. rate 18, height 5' 3" (1.6 m), weight (!) 121.6 kg, SpO2 96 %.    Vent Mode: PRVC FiO2 (%):  [40 %] 40 % Set Rate:  [24 bmp] 24 bmp Vt Set:  [420 mL] 420 mL PEEP:  [5 cmH20] 5 cmH20 Pressure Support:  [15 cmH20] 15 cmH20 Plateau Pressure:  [18 cmH20-21 cmH20] 21 cmH20   Intake/Output Summary (Last 24 hours) at 11/16/2019 1125 Last data filed at 11/16/2019 1122 Gross per 24 hour  Intake 2522.84 ml  Output 4350 ml  Net -1827.16 ml  Filed Weights   11/14/19 0500 11/15/19 0500 11/16/19 0208  Weight: (!) 124.2 kg (!) 121 kg (!) 121.6 kg    Examination: General obese ill-appearing woman HENT just self extubated, oropharynx dry Pulm few coarse breath  sounds, managing her secretions, no wheeze Card regular, no murmur abd obese, soft, nondistended, positive bowel sounds Neuro eyes open, a bit agitated, able to speak, confused on Precedex 0.4.  Redirectable Ext right lower extremity with some darkness but no erythema or purulent drainage, mild warmth   Resolved Hospital Problem list     Assessment & Plan:   Acute hypercarbic respiratory failure superimposed on h/o Chronic hypoxic respiratory failure ILD; likely element of OHS/OSA as well -Chronically on 3 L -sputum neg.  Plan Appears to be initially stable post self extubation.  Had been tolerating PS earlier in the day. Push aggressive pulmonary hygiene Continue her nasal cannula oxygen, baseline 3 L/min Wean Precedex as able Steroids initiated 7/31 given ILD and ESR.  Plan to continue for now Hold diuretics on 8/1   Severe sepsis 2/2 RLE cellulitis  -CT imaging without evidence of myocytitis, pyomyositis, or drainable fluid collection; marked areas actually improved when comparing film exam on 7/27  plan Zosyn day 8 of 8 Wound care  Acute renal insufficiency, likely from aggressive diuresis Plan Hold Lasix on 8/1 Follow BMP and urine output  Fluid and Electrolyte Imbalance: Hypernatremia and  hypokalemia   Plan Increased free water on 7/31 Replace potassium as indicated Follow BMP  Acute metabolic encephalopathy: Improving.  Confused and somewhat agitated post extubation 8/1 Plan Continue low-dose Precedex, wean as able  Worsening thrombocytopenia w/ H/o ITP - followed by Dr Irene Limbo.  Platelets have been dropping slowly as of 7/28  plan Follow CBC  Hyperglycemia Plan Sliding-scale insulin as per protocol Hold tube feed coverage post extubation  Nutrition Plan SLP evaluation when patient can participate  Best practice:  Diet: NPO Pain/Anxiety/Delirium protocol (if indicated): Low-dose Precedex, wean as able VAP protocol (if indicated): x DVT prophylaxis:  Enoxaparin GI prophylaxis: Pepcid Glucose control: ssi Mobility: bed rest Code Status: full code Family Communication: Updated the patient's sister at bedside 7/31 Disposition: full code ICU  Independent critical care time 35 minutes  Baltazar Apo, MD, PhD 11/16/2019, 11:25 AM [facility] [phone] or if no answer [phone]     LABS    PULMONARY Recent Labs  Lab 11/10/19 2152 11/11/19 0115 11/11/19 0545 11/11/19 1245 11/14/19 0307  PHART 7.275* 7.264* 7.271* 7.341* 7.489*  PCO2ART 60.2* 63.9* 68.6* 59.6* 42.5  PO2ART 96.4 147* 96 271* 67*  HCO3 26.8 27.9 31.7* 32.2* 32.4*  TCO2  --   --  34* 34* 34*  O2SAT 96.2 98.5 96.0 100.0 94.0    CBC Recent Labs  Lab 11/13/19 1003 11/13/19 1003 11/14/19 0143 11/14/19 0307 11/15/19 0114  HGB 10.5*   < > 11.6* 11.2* 11.2*  HCT 33.0*   < > 35.3* 33.0* 34.5*  WBC 3.5*  --  4.0  --  6.5  PLT 52*  --  59*  --  59*   < > = values in this interval not displayed.    COAGULATION Recent Labs  Lab 11/10/19 2152  INR 1.3*    CARDIAC  No results for input(s): TROPONINI in the last 168 hours. No results for input(s): PROBNP in the last 168 hours.   CHEMISTRY Recent Labs  Lab 11/10/19 2152 11/11/19 0055 11/11/19 1203 11/11/19 1245 11/11/19 1628 11/12/19 0555 11/12/19 0555 11/12/19  1633 11/13/19 9564 11/13/19 6290 11/14/19 0143 11/14/19 0143 11/14/19 0307 11/14/19 0307 11/15/19 0114 11/16/19 0825  NA 141   < >  --    < >  --  147*   < >  --  146*  --  146*  --  149*  --  147* 143  K 4.0   < >  --    < >  --  3.3*   < >  --  3.5   < > 3.4*   < > 3.4*   < > 4.0 3.4*  CL 108   < >  --   --   --  113*  --   --  109  --  106  --   --   --  108 100  CO2 26   < >  --   --   --  23  --   --  25  --  28  --   --   --  27 26  GLUCOSE 139*  137*   < >  --   --   --  142*  --   --  147*  --  194*  --   --   --  197* 292*  BUN 12   < >  --   --   --  8  --   --  16  --  22  --   --   --  41*  59*  CREATININE 0.69   < >  --   --   --  0.55  --   --  0.68  --  0.96  --   --   --  1.26* 1.71*  CALCIUM 8.9   < >  --   --   --  9.1  --   --  9.8  --  10.5*  --   --   --  10.7* 10.2  MG 2.0  --  2.2  --  1.9 1.9  --  1.6*  --   --   --   --   --   --   --   --   PHOS 2.1*  --  2.7  --  1.4* 1.2*  --  3.6  --   --   --   --   --   --   --   --    < > = values in this interval not displayed.   Estimated Creatinine Clearance: 39.3 mL/min (A) (by C-G formula based on SCr of 1.71 mg/dL (H)).   LIVER Recent Labs  Lab 11/10/19 2152 11/11/19 0055 11/12/19 0555 11/14/19 0143 11/15/19 0114  AST _0 ALT _1 ALKPHOS 39 39 35* 41 46  BILITOT 1.9* 1.9* 0.8 1.2 0.9  PROT 6.2* 5.9* 5.4* 6.2* 6.3*  ALBUMIN 3.2* 3.0* 2.7* 3.0* 2.9*  INR 1.3*  --   --   --   --      INFECTIOUS Recent Labs  Lab 11/10/19 0545 11/10/19 1051 11/10/19 2152 11/11/19 0055 11/12/19 0555  LATICACIDVEN 1.4 1.1 1.5  --   --   PROCALCITON  --   --  0.23 0.18 <0.10     ENDOCRINE CBG (last 3)  Recent Labs    11/15/19 2345 11/16/19 0409 11/16/19 0725  GLUCAP 297* 172* 246*

## 2019-11-17 ENCOUNTER — Inpatient Hospital Stay (HOSPITAL_COMMUNITY): Payer: Medicare Other

## 2019-11-17 DIAGNOSIS — A419 Sepsis, unspecified organism: Principal | ICD-10-CM

## 2019-11-17 DIAGNOSIS — R6521 Severe sepsis with septic shock: Secondary | ICD-10-CM

## 2019-11-17 LAB — BASIC METABOLIC PANEL
Anion gap: 13 (ref 5–15)
Anion gap: 18 — ABNORMAL HIGH (ref 5–15)
BUN: 79 mg/dL — ABNORMAL HIGH (ref 8–23)
BUN: 84 mg/dL — ABNORMAL HIGH (ref 8–23)
CO2: 27 mmol/L (ref 22–32)
CO2: 27 mmol/L (ref 22–32)
Calcium: 10.1 mg/dL (ref 8.9–10.3)
Calcium: 10.2 mg/dL (ref 8.9–10.3)
Chloride: 102 mmol/L (ref 98–111)
Chloride: 96 mmol/L — ABNORMAL LOW (ref 98–111)
Creatinine, Ser: 2.42 mg/dL — ABNORMAL HIGH (ref 0.44–1.00)
Creatinine, Ser: 2.74 mg/dL — ABNORMAL HIGH (ref 0.44–1.00)
GFR calc Af Amer: 23 mL/min — ABNORMAL LOW (ref >60)
GFR calc Af Amer: 20 mL/min — ABNORMAL LOW (ref >60)
GFR calc non Af Amer: 20 mL/min — ABNORMAL LOW (ref >60)
GFR calc non Af Amer: 17 mL/min — ABNORMAL LOW (ref >60)
Glucose, Bld: 138 mg/dL — ABNORMAL HIGH (ref 70–99)
Glucose, Bld: 164 mg/dL — ABNORMAL HIGH (ref 70–99)
Potassium: 4.4 mmol/L (ref 3.5–5.1)
Potassium: 3.9 mmol/L (ref 3.5–5.1)
Sodium: 142 mmol/L (ref 135–145)
Sodium: 141 mmol/L (ref 135–145)

## 2019-11-17 LAB — GLUCOSE, CAPILLARY
Glucose-Capillary: 124 mg/dL — ABNORMAL HIGH (ref 70–99)
Glucose-Capillary: 97 mg/dL (ref 70–99)
Glucose-Capillary: 106 mg/dL — ABNORMAL HIGH (ref 70–99)
Glucose-Capillary: 129 mg/dL — ABNORMAL HIGH (ref 70–99)
Glucose-Capillary: 154 mg/dL — ABNORMAL HIGH (ref 70–99)
Glucose-Capillary: 198 mg/dL — ABNORMAL HIGH (ref 70–99)
Glucose-Capillary: 224 mg/dL — ABNORMAL HIGH (ref 70–99)

## 2019-11-17 LAB — CBC
HCT: 38.3 % (ref 36.0–46.0)
Hemoglobin: 11.8 g/dL — ABNORMAL LOW (ref 12.0–15.0)
MCH: 30.9 pg (ref 26.0–34.0)
MCHC: 30.8 g/dL (ref 30.0–36.0)
MCV: 100.3 fL — ABNORMAL HIGH (ref 80.0–100.0)
Platelets: 99 10*3/uL — ABNORMAL LOW (ref 150–400)
RBC: 3.82 MIL/uL — ABNORMAL LOW (ref 3.87–5.11)
RDW: 18 % — ABNORMAL HIGH (ref 11.5–15.5)
WBC: 7.5 10*3/uL (ref 4.0–10.5)
nRBC: 0 % (ref 0.0–0.2)

## 2019-11-17 LAB — MAGNESIUM: Magnesium: 2.1 mg/dL (ref 1.7–2.4)

## 2019-11-17 LAB — PHOSPHORUS: Phosphorus: 7.5 mg/dL — ABNORMAL HIGH (ref 2.5–4.6)

## 2019-11-17 MED ORDER — METOCLOPRAMIDE HCL 5 MG/ML IJ SOLN
10.0000 mg | Freq: Three times a day (TID) | INTRAMUSCULAR | Status: DC | PRN
  Administered 2019-11-17: 10 mg via INTRAVENOUS
  Filled 2019-11-17: qty 2

## 2019-11-17 MED ORDER — PREDNISONE 20 MG PO TABS
20.0000 mg | ORAL_TABLET | Freq: Every day | ORAL | Status: AC
  Administered 2019-11-18 – 2019-11-19 (×2): 20 mg via ORAL
  Filled 2019-11-17: qty 2
  Filled 2019-11-17: qty 1

## 2019-11-17 MED ORDER — ONDANSETRON HCL 4 MG/2ML IJ SOLN
4.0000 mg | Freq: Four times a day (QID) | INTRAMUSCULAR | Status: DC | PRN
  Administered 2019-11-17 – 2019-11-26 (×4): 4 mg via INTRAVENOUS
  Filled 2019-11-17 (×4): qty 2

## 2019-11-17 MED ORDER — LACTATED RINGERS IV SOLN: INTRAVENOUS | Status: DC

## 2019-11-17 NOTE — Progress Notes (Signed)
[facility] Progress Note Patient Name: Kathy Irwin DOB: 1950/05/02 MRN: [id number]   Date of Service  11/17/2019  HPI/Events of Note  Several episodes of n/v today refractory to Zofran.   eICU Interventions  KUB ordered. Reglan 85m IV Q8H PRN refractory n/v ordered.     Intervention Category Minor Interventions: Routine modifications to care plan (e.g. PRN medications for pain, fever)  RMarily LenteStretch 11/17/2019, 8:59 PM

## 2019-11-17 NOTE — Plan of Care (Signed)
  Problem: Education: Goal: Knowledge of General Education information will improve Description: Including pain rating scale, medication(s)/side effects and non-pharmacologic comfort measures 11/17/2019 0448 by Lorraine Lax, RN Outcome: Progressing 11/17/2019 0448 by Lorraine Lax, RN Outcome: Progressing   Problem: Health Behavior/Discharge Planning: Goal: Ability to manage health-related needs will improve 11/17/2019 0448 by Lorraine Lax, RN Outcome: Progressing 11/17/2019 0448 by Lorraine Lax, RN Outcome: Progressing   Problem: Clinical Measurements: Goal: Ability to maintain clinical measurements within normal limits will improve 11/17/2019 0448 by Lorraine Lax, RN Outcome: Progressing 11/17/2019 0448 by Lorraine Lax, RN Outcome: Progressing Goal: Will remain free from infection 11/17/2019 0448 by Lorraine Lax, RN Outcome: Progressing 11/17/2019 0448 by Lorraine Lax, RN Outcome: Progressing Goal: Diagnostic test results will improve 11/17/2019 0448 by Lorraine Lax, RN Outcome: Progressing 11/17/2019 0448 by Lorraine Lax, RN Outcome: Progressing Goal: Respiratory complications will improve 11/17/2019 0448 by Lorraine Lax, RN Outcome: Progressing 11/17/2019 0448 by Lorraine Lax, RN Outcome: Progressing Goal: Cardiovascular complication will be avoided 11/17/2019 0448 by Lorraine Lax, RN Outcome: Progressing 11/17/2019 0448 by Lorraine Lax, RN Outcome: Progressing   Problem: Activity: Goal: Risk for activity intolerance will decrease 11/17/2019 0448 by Lorraine Lax, RN Outcome: Progressing 11/17/2019 0448 by Lorraine Lax, RN Outcome: Progressing   Problem: Nutrition: Goal: Adequate nutrition will be maintained 11/17/2019 0448 by Lorraine Lax, RN Outcome: Progressing 11/17/2019 0448 by Lorraine Lax, RN Outcome: Progressing   Problem: Coping: Goal: Level of anxiety will decrease 11/17/2019 0448 by Lorraine Lax, RN Outcome: Progressing 11/17/2019 0448 by Lorraine Lax, RN Outcome: Progressing   Problem: Elimination: Goal: Will not experience complications related to bowel motility 11/17/2019 0448 by Lorraine Lax, RN Outcome: Progressing 11/17/2019 0448 by Lorraine Lax, RN Outcome: Progressing Goal: Will not experience complications related to urinary retention Outcome: Progressing   Problem: Pain Managment: Goal: General experience of comfort will improve Outcome: Progressing   Problem: Safety: Goal: Ability to remain free from injury will improve Outcome: Progressing   Problem: Skin Integrity: Goal: Risk for impaired skin integrity will decrease Outcome: Progressing   Problem: Activity: Goal: Ability to tolerate increased activity will improve Outcome: Progressing   Problem: Respiratory: Goal: Ability to maintain a clear airway and adequate ventilation will improve Outcome: Progressing   Problem: Role Relationship: Goal: Method of communication will improve Outcome: Progressing   Problem: Clinical Measurements: Goal: Ability to avoid or minimize complications of infection will improve Outcome: Progressing   Problem: Skin Integrity: Goal: Skin integrity will improve Outcome: Progressing

## 2019-11-17 NOTE — Progress Notes (Signed)
NAME:  Kathy Irwin, MRN:  [id number], DOB:  1950/07/24, LOS: 8 ADMISSION DATE:  11/08/2019, CONSULTATION DATE:  11/10/19 REFERRING MD:  Triad MD, CHIEF COMPLAINT:  Acute encephaolpathy in setting of sepsis, ILD and phenergan   Brief History   69 year old female with obesity, chronic diastolic failure, ITP, chronic hypoxemic respiratory failure on 3 L nasal cannula at baseline secondary to interstitial lung disease followed by Dr. Vaughan Browner in the ILD clinic at pulmonary clinic.  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  History of COVID-19 in September 2020  ->admitted 7/25 w/ RLE cellulitis in spite of 10d Cephalexin also cc: N/V. PCCM asked to see 7/26 when developed worsening delirium   Past Medical History     has a past medical history of Acute respiratory failure with hypoxia ([place]) (12/2018), Diabetes mellitus without complication ([place]), Hypersensitivity pneumonitis ([place]) (12/19/2017), Hypertension, and ILD (interstitial lung disease) ([place]) (12/24/2018).   reports that she has never smoked. She has never used smokeless tobacco.    Significant Hospital Events   11/09/2019/admit: w/ working dx of cellulitis. Lethargic. CT head ordered. ABX started.  7/26 desaturated. sats 70s. BP dropping. Received IVF bolus.  Developed worsening delirium, actually received Ativan for this.  Critical care consulted later for delirium, which later became more lethargy and ongoing fever, moved to the intensive care. 7/27 intubated-->still w/ decreased MS and hypercarbia. Also trying to facilitate CT imaging which showed: "1. Moderate diffuse skin thickening and subcutaneous soft tissue swelling/edema/fluid suggesting cellulitis. No discrete fluid collection to suggest a drainable soft tissue abscess. 2. No CT findings to suggest myofasciitis or pyomyositis.3. No CT findings suspicious for septic arthritis or osteomyelitis". Was briefly pressor dependent  7/28 discoloration of right LE a  little better.  7/29: Failed spontaneous breathing trial became tachypneic.  Still 1.2 L positive since arrival to the intensive care.  Continuing IV diuresis.  Changing propofol to Precedex 7/30 awake. now w/ negative fluid balance. Tolerating PSV of 15 but not ready for extubation. Film actually a little worse. Leg clinically much better. Sed rate and BNP sent to decide of trial of steroids.  Consults:  11/10/2019-critical care medicine consult  Procedures:  oett 7/27>>> 8/1  Significant Diagnostic Tests:  CT right LE 7/27>>>"1. Moderate diffuse skin thickening and subcutaneous soft tissue swelling/edema/fluid suggesting cellulitis. No discrete fluid collection to suggest a drainable soft tissue abscess. 2. No CT findings to suggest myofasciitis or pyomyositis.3. No CT findings suspicious for septic arthritis or osteomyelitis".   Micro Data:  7/2 5 - MRSA PCR - neg 7/25- covid - neg BCX2 7/27>>>  Antimicrobials:  vanc 7/25 - 7/28 Zosyn 7/25 -   Interim history/subjective:   Tolerated extubation, now on room air Complaining of some back pain and sores from being in the bed   Objective   Blood pressure 117/69, pulse 93, temperature (!) 97.3 F (36.3 C), temperature source Oral, resp. rate (!) 31, height 5' 3" (1.6 m), weight (!) 124.1 kg, SpO2 100 %.    Vent Mode: CPAP;PSV FiO2 (%):  [40 %] 40 % PEEP:  [5 cmH20] 5 cmH20 Pressure Support:  [10 cmH20] 10 cmH20 Plateau Pressure:  [20 cmH20] 20 cmH20   Intake/Output Summary (Last 24 hours) at 11/17/2019 1013 Last data filed at 11/17/2019 0600 Gross per 24 hour  Intake 749.81 ml  Output 1850 ml  Net -1100.19 ml   Filed Weights   11/15/19 0500 11/16/19 0208 11/17/19 0259  Weight: (!) 121 kg Marland Kitchen)  121.6 kg (!) 124.1 kg    Examination: General obese woman, no distress HENT oropharynx moist, strong voice, no stridor, good secretion management Pulm wheeze to both bases but no wheezing or crackles Card regular, distant, no  murmur abd obese, nondistended, positive bowel sounds Neuro awake, alert and interacting.  Confused about the events that led to the hospitalization but reorienting.  Moves all extremities.  Follows commands Ext right lower extremity with some darkness but no erythema or warmth.  No purulent drainage.  Improved   Resolved Hospital Problem list     Assessment & Plan:   Acute hypercarbic respiratory failure superimposed on h/o Chronic hypoxic respiratory failure ILD; likely element of OHS/OSA as well -Chronically on 3 L -sputum neg.  Plan Continue push pulmonary hygiene Precedex off.  Steroids restarted on 7/31 given her ILD.  Decrease to 20 mg daily for the next 2 days and then stop (stop date entered) Hold diuretics given evolving renal failure  Severe sepsis 2/2 RLE cellulitis  -CT imaging without evidence of myocytitis, pyomyositis, or drainable fluid collection; marked areas actually improved when comparing film exam on 7/27  plan Days of Zosyn completed on 8/1 Wound care  Acute renal insufficiency, likely from aggressive diuresis, worse on 8/2 Plan Lasix on hold Follow urine output and BMP Avoid nephrotoxins  Fluid and Electrolyte Imbalance: Hypernatremia and  hypokalemia   Plan Follow BMP Replace potassium as indicated  Acute metabolic encephalopathy: Continues to improve Plan Precedex off Continue to correct metabolic disarray  Worsening thrombocytopenia w/ H/o ITP - followed by Dr Irene Limbo.  Platelets have been dropping slowly as of 7/28  plan Following CBC  Hyperglycemia Plan Sliding-scale insulin as per protocol  Nutrition Plan Try to start a clear diet on 8/2  Best practice:  Diet: Start clears on 8/2 Pain/Anxiety/Delirium protocol (if indicated): N/A VAP protocol (if indicated): x DVT prophylaxis: Enoxaparin GI prophylaxis: Pepcid Glucose control: ssi Mobility: bed rest Code Status: full code Family Communication: Updated the patient's sister at  bedside 8/2 Disposition: ICU  Independent critical care time 31 minutes  Baltazar Apo, MD, PhD 11/17/2019, 10:13 AM [facility] [phone] or if no answer [phone]     LABS    PULMONARY Recent Labs  Lab 11/10/19 2152 11/11/19 0115 11/11/19 0545 11/11/19 1245 11/14/19 0307  PHART 7.275* 7.264* 7.271* 7.341* 7.489*  PCO2ART 60.2* 63.9* 68.6* 59.6* 42.5  PO2ART 96.4 147* 96 271* 67*  HCO3 26.8 27.9 31.7* 32.2* 32.4*  TCO2  --   --  34* 34* 34*  O2SAT 96.2 98.5 96.0 100.0 94.0    CBC Recent Labs  Lab 11/14/19 0143 11/14/19 0143 11/14/19 0307 11/15/19 0114 11/17/19 0600  HGB 11.6*   < > 11.2* 11.2* 11.8*  HCT 35.3*   < > 33.0* 34.5* 38.3  WBC 4.0  --   --  6.5 7.5  PLT 59*  --   --  59* 99*   < > = values in this interval not displayed.    COAGULATION Recent Labs  Lab 11/10/19 2152  INR 1.3*    CARDIAC  No results for input(s): TROPONINI in the last 168 hours. No results for input(s): PROBNP in the last 168 hours.   CHEMISTRY Recent Labs  Lab 11/11/19 9983 11/11/19 1203 11/11/19 1245 11/11/19 1628 11/12/19 3825 11/12/19 0539 11/12/19 1633 11/13/19 7673 11/13/19 4193 11/14/19 0143 11/14/19 0143 11/14/19 0307 11/14/19 0307 11/15/19 0114 11/15/19 0114 11/16/19 0825 11/17/19 0307  NA   < >  --    < >  --  147*   < >  --  146*   < > 146*  --  149*  --  147*  --  143 142  K   < >  --    < >  --  3.3*   < >  --  3.5   < > 3.4*   < > 3.4*   < > 4.0   < > 3.4* 4.4  CL   < >  --   --   --  113*   < >  --  109  --  106  --   --   --  108  --  100 102  CO2   < >  --   --   --  23   < >  --  25  --  28  --   --   --  27  --  26 27  GLUCOSE   < >  --   --   --  142*   < >  --  147*  --  194*  --   --   --  197*  --  292* 138*  BUN   < >  --   --   --  8   < >  --  16  --  22  --   --   --  41*  --  59* 79*  CREATININE   < >  --   --   --  0.55   < >  --  0.68  --  0.96  --   --   --  1.26*  --  1.71* 2.42*  CALCIUM   < >   --   --   --  9.1   < >  --  9.8  --  10.5*  --   --   --  10.7*  --  10.2 10.1  MG  --  2.2  --  1.9 1.9  --  1.6*  --   --   --   --   --   --   --   --   --  2.1  PHOS  --  2.7  --  1.4* 1.2*  --  3.6  --   --   --   --   --   --   --   --   --  7.5*   < > = values in this interval not displayed.   Estimated Creatinine Clearance: 28.1 mL/min (A) (by C-G formula based on SCr of 2.42 mg/dL (H)).   LIVER Recent Labs  Lab 11/10/19 2152 11/11/19 0055 11/12/19 0555 11/14/19 0143 11/15/19 0114  AST _0 ALT _1 ALKPHOS 39 39 35* 41 46  BILITOT 1.9* 1.9* 0.8 1.2 0.9  PROT 6.2* 5.9* 5.4* 6.2* 6.3*  ALBUMIN 3.2* 3.0* 2.7* 3.0* 2.9*  INR 1.3*  --   --   --   --      INFECTIOUS Recent Labs  Lab 11/10/19 1051 11/10/19 2152 11/11/19 0055 11/12/19 0555  LATICACIDVEN 1.1 1.5  --   --   PROCALCITON  --  0.23 0.18 <0.10     ENDOCRINE CBG (last 3)  Recent Labs    11/16/19 2327 11/17/19 0325 11/17/19 0740  GLUCAP 176* 124* 97

## 2019-11-17 NOTE — Progress Notes (Signed)
[facility] Progress Note Patient Name: Kathy Irwin DOB: Aug 13, 1950 MRN: [id number]   Date of Service  11/17/2019  HPI/Events of Note  Patient having diarrhea, not taking any PO, and Cr has been rising.   eICU Interventions  Start mIVF with LR @ 100cc/hr.     Intervention Category Minor Interventions: Electrolytes abnormality - evaluation and management  Marily Lente Taygen Newsome 11/17/2019, 10:28 PM

## 2019-11-18 ENCOUNTER — Inpatient Hospital Stay (HOSPITAL_COMMUNITY): Payer: Medicare Other

## 2019-11-18 ENCOUNTER — Other Ambulatory Visit: Payer: Self-pay

## 2019-11-18 ENCOUNTER — Other Ambulatory Visit (HOSPITAL_COMMUNITY): Payer: Medicare Other

## 2019-11-18 DIAGNOSIS — N179 Acute kidney failure, unspecified: Secondary | ICD-10-CM

## 2019-11-18 LAB — CBC
HCT: 34.1 % — ABNORMAL LOW (ref 36.0–46.0)
Hemoglobin: 11 g/dL — ABNORMAL LOW (ref 12.0–15.0)
MCH: 31.8 pg (ref 26.0–34.0)
MCHC: 32.3 g/dL (ref 30.0–36.0)
MCV: 98.6 fL (ref 80.0–100.0)
Platelets: 152 10*3/uL (ref 150–400)
RBC: 3.46 MIL/uL — ABNORMAL LOW (ref 3.87–5.11)
RDW: 18 % — ABNORMAL HIGH (ref 11.5–15.5)
WBC: 8.6 10*3/uL (ref 4.0–10.5)
nRBC: 0 % (ref 0.0–0.2)

## 2019-11-18 LAB — PHOSPHORUS: Phosphorus: 8.4 mg/dL — ABNORMAL HIGH (ref 2.5–4.6)

## 2019-11-18 LAB — HEPATIC FUNCTION PANEL
ALT: 17 U/L (ref 0–44)
AST: 22 U/L (ref 15–41)
Albumin: 3.4 g/dL — ABNORMAL LOW (ref 3.5–5.0)
Alkaline Phosphatase: 41 U/L (ref 38–126)
Bilirubin, Direct: 0.2 mg/dL (ref 0.0–0.2)
Indirect Bilirubin: 1 mg/dL — ABNORMAL HIGH (ref 0.3–0.9)
Total Bilirubin: 1.2 mg/dL (ref 0.3–1.2)
Total Protein: 6.7 g/dL (ref 6.5–8.1)

## 2019-11-18 LAB — SODIUM, URINE, RANDOM: Sodium, Ur: 68 mmol/L

## 2019-11-18 LAB — GLUCOSE, CAPILLARY
Glucose-Capillary: 126 mg/dL — ABNORMAL HIGH (ref 70–99)
Glucose-Capillary: 126 mg/dL — ABNORMAL HIGH (ref 70–99)
Glucose-Capillary: 116 mg/dL — ABNORMAL HIGH (ref 70–99)
Glucose-Capillary: 116 mg/dL — ABNORMAL HIGH (ref 70–99)
Glucose-Capillary: 135 mg/dL — ABNORMAL HIGH (ref 70–99)
Glucose-Capillary: 177 mg/dL — ABNORMAL HIGH (ref 70–99)
Glucose-Capillary: 158 mg/dL — ABNORMAL HIGH (ref 70–99)

## 2019-11-18 LAB — PROTEIN / CREATININE RATIO, URINE
Creatinine, Urine: 37.13 mg/dL
Protein Creatinine Ratio: 0.24 mg/mg{Cre} — ABNORMAL HIGH (ref 0.00–0.15)
Total Protein, Urine: 9 mg/dL

## 2019-11-18 LAB — BASIC METABOLIC PANEL
Anion gap: 16 — ABNORMAL HIGH (ref 5–15)
BUN: 91 mg/dL — ABNORMAL HIGH (ref 8–23)
CO2: 27 mmol/L (ref 22–32)
Calcium: 9.8 mg/dL (ref 8.9–10.3)
Chloride: 96 mmol/L — ABNORMAL LOW (ref 98–111)
Creatinine, Ser: 3.16 mg/dL — ABNORMAL HIGH (ref 0.44–1.00)
GFR calc Af Amer: 17 mL/min — ABNORMAL LOW (ref >60)
GFR calc non Af Amer: 14 mL/min — ABNORMAL LOW (ref >60)
Glucose, Bld: 153 mg/dL — ABNORMAL HIGH (ref 70–99)
Potassium: 3.5 mmol/L (ref 3.5–5.1)
Sodium: 139 mmol/L (ref 135–145)

## 2019-11-18 LAB — MAGNESIUM: Magnesium: 2.2 mg/dL (ref 1.7–2.4)

## 2019-11-18 MED ORDER — BOOST / RESOURCE BREEZE PO LIQD CUSTOM
1.0000 | Freq: Three times a day (TID) | ORAL | Status: DC
  Administered 2019-11-18 – 2019-11-19 (×3): 1 via ORAL

## 2019-11-18 MED ORDER — ENOXAPARIN SODIUM 30 MG/0.3ML ~~LOC~~ SOLN
30.0000 mg | SUBCUTANEOUS | Status: DC
  Administered 2019-11-19 – 2019-11-20 (×2): 30 mg via SUBCUTANEOUS
  Filled 2019-11-18 (×3): qty 0.3

## 2019-11-18 MED ORDER — METOCLOPRAMIDE HCL 5 MG/ML IJ SOLN
10.0000 mg | Freq: Three times a day (TID) | INTRAMUSCULAR | Status: DC
  Administered 2019-11-18 – 2019-11-19 (×3): 10 mg via INTRAVENOUS
  Filled 2019-11-18 (×3): qty 2

## 2019-11-18 NOTE — Progress Notes (Addendum)
Occupational Therapy Evaluation Patient Details Name: Kathy Irwin MRN: [id number] DOB: May 25, 1950 Today's Date: 11/18/2019    History of Present Illness 69 year old female presents 7/25 with dx of RLE cellulitis. 7/26 pt developed worsening delirium and desaturated requiring intubation 7/27, pt extubated 8/1. PMH obesity, chronic diastolic failure, ITP, chronic hypoxemic respiratory failure on 3 L nasal cannula at baseline secondary to interstitial lung disease. 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  History of COVID-19 in September 2020.   Clinical Impression   PTA, pt was living at home alone, pt reports she was independent with ADL/IADL and functional mobility but does have a RW and cane at home. Pt currently limited in self-care and functional mobility secondary to generalized weakness, decreased standing/activity tolerance, and cognitive limitations (see cognition section). Pt disoriented to year and following one step simple commands inconsistently and with increased time. Due to decreased coordination, tremors, and weakness in BUE pt required minA for grooming tasks while sitting EOB. Pt required modA+2 to stand from EOB, tolerated standing for ~10seconds. Pt reporting nausea during session, no emesis. HR up to 122 sitting EOB. Pt on 3L HFNC SpO2 88%-96%. Due to decline in current level of function, pt would benefit from acute OT to address strengthening, activity tolerance, cognition, and safe completion of ADL/IADL to facilitate safe D/C to venue listed below. At this time, recommend SNF follow-up. Will continue to follow acutely.      Follow Up Recommendations  SNF;Supervision/Assistance - 24 hour    Equipment Recommendations  Other (comment) (TBD at next venue)    Recommendations for Other Services PT consult     Precautions / Restrictions Precautions Precautions: Fall Restrictions Weight Bearing Restrictions: No      Mobility Bed Mobility Overal  bed mobility: Needs Assistance Bed Mobility: Supine to Sit;Sit to Supine     Supine to sit: Min assist Sit to supine: Max assist;+2 for physical assistance;+2 for safety/equipment   General bed mobility comments: minA for cues and safety to progress to EOB, HOB elevated;maxA+2 for BLE management with return to bed  Transfers Overall transfer level: Needs assistance Equipment used: 2 person hand held assist Transfers: Sit to/from Stand Sit to Stand: Mod assist;+2 physical assistance;+2 safety/equipment         General transfer comment: modA+2 to stand from EOB;assist to take 2-3 steps along EOB;pt with limited standing tolerance, about 10seconds;sit<>standx2    Balance Overall balance assessment: Needs assistance Sitting-balance support: No upper extremity supported;Feet supported Sitting balance-Leahy Scale: Fair Sitting balance - Comments: able to perform grooming task with no UE support and minguard for safety   Standing balance support: Bilateral upper extremity supported Standing balance-Leahy Scale: Poor Standing balance comment: heavy reliance on BUE support on therapists, limited standing tolerance/endurance                           ADL either performed or assessed with clinical judgement   ADL Overall ADL's : Needs assistance/impaired Eating/Feeding: Minimal assistance;Sitting Eating/Feeding Details (indicate cue type and reason): tremor impacting hand to mouth Grooming: Minimal assistance;Sitting Grooming Details (indicate cue type and reason): required assistance to stabilize UE due to tremor Upper Body Bathing: Minimal assistance;Sitting   Lower Body Bathing: Moderate assistance;Sit to/from stand;+2 for physical assistance;+2 for safety/equipment   Upper Body Dressing : Minimal assistance;Sitting   Lower Body Dressing: Moderate assistance;Sit to/from stand;+2 for physical assistance;+2 for safety/equipment Lower Body Dressing Details (indicate cue  type  and reason): required assistance to don socks sitting EOB   Toilet Transfer Details (indicate cue type and reason): deferred Toileting- Clothing Manipulation and Hygiene: Total assistance       Functional mobility during ADLs: Moderate assistance;+2 for physical assistance;+2 for safety/equipment General ADL Comments: pt tolerated sitting EOB to complete grooming with minguard-minA, she required modA+2 to stand from EOB limited standing tolerance, about 10 seconds;limited functional use of BUE due to weakness and tremors     Vision Baseline Vision/History: Wears glasses Additional Comments: continue to assess     Perception     Praxis      Pertinent Vitals/Pain Pain Assessment: No/denies pain (nauseous)     Hand Dominance Right   Extremity/Trunk Assessment Upper Extremity Assessment Upper Extremity Assessment: Generalized weakness;RUE deficits/detail;LUE deficits/detail RUE Deficits / Details: demonstrates decreased proximal strength, shoulder compensation during shoulder flexion, limited AROM about 130*;grossly 3/5;tremor in BUE impacting coordination;pt with weak grip strength requiring assistance to don toothpaste; RUE Coordination: decreased fine motor;decreased gross motor LUE Deficits / Details: demonstrates decreased proximal strength, shoulder compensation during shoulder flexion, limited AROM about 130*;grossly 3/5;tremor in BUE impacting coordination;pt with weak grip strength requiring assistance to don toothpaste; LUE Coordination: decreased fine motor;decreased gross motor   Lower Extremity Assessment Lower Extremity Assessment: RLE deficits/detail;LLE deficits/detail;Generalized weakness RLE Deficits / Details: grossly 3/5 LLE Deficits / Details: grossly 3/5   Cervical / Trunk Assessment Cervical / Trunk Assessment: Other exceptions Cervical / Trunk Exceptions: large body habitus   Communication Communication Communication: No difficulties   Cognition  Arousal/Alertness: Awake/alert Behavior During Therapy: Flat affect Overall Cognitive Status: Impaired/Different from baseline Area of Impairment: Orientation;Attention;Memory;Following commands;Safety/judgement;Awareness;Problem solving                 Orientation Level: Disoriented to;Time Current Attention Level: Focused Memory: Decreased recall of precautions;Decreased short-term memory Following Commands: Follows one step commands inconsistently;Follows one step commands with increased time Safety/Judgement: Decreased awareness of safety;Decreased awareness of deficits Awareness: Intellectual Problem Solving: Slow processing;Decreased initiation;Requires verbal cues;Requires tactile cues General Comments: Pt stating year is 2002 despite cues, pt with flat affect throughout session, tangential thoughts and easily distractable during session (sister arrive and tele visit started increasing distractibility) pt following simple one step cues with increased time and inconsistently   General Comments  BP 164/66 supine, BP 135/79 sitting EOB    Exercises     Shoulder Instructions      Home Living Family/patient expects to be discharged to:: Private residence Living Arrangements: Alone Available Help at Discharge: Available PRN/intermittently;Family Type of Home: House Home Access: Stairs to enter Technical brewer of Steps: 3 Entrance Stairs-Rails: Right;Left Home Layout: One level     Bathroom Shower/Tub: Occupational psychologist: Standard     Home Equipment: Building services engineer Comments: pt on 3lnc at baseline      Prior Functioning/Environment Level of Independence: Independent        Comments: ADLs, IADLs, mobility independently         OT Problem List: Decreased strength;Decreased range of motion;Decreased activity tolerance;Impaired balance (sitting and/or standing);Decreased cognition;Decreased knowledge of use of DME or AE;Decreased  safety awareness;Obesity;Impaired UE functional use;Pain      OT Treatment/Interventions: Self-care/ADL training;Therapeutic exercise;Energy conservation;DME and/or AE instruction;Therapeutic activities;Cognitive remediation/compensation;Patient/family education;Balance training    OT Goals(Current goals can be found in the care plan section) Acute Rehab OT Goals Patient Stated Goal: pt did not state OT Goal Formulation: With patient Time For Goal Achievement: 12/02/19 Potential to Achieve  Goals: Good ADL Goals Pt Will Perform Grooming: sitting;with supervision;standing Pt Will Perform Lower Body Dressing: sit to/from stand;with adaptive equipment;with supervision Pt Will Transfer to Toilet: with min guard assist;ambulating Additional ADL Goal #1: Pt will complete multistep cognition task with <3 errors for safe engagement of ADL/IADL and functional mobility. Additional ADL Goal #2: Pt will complete bed mobility at supervision level in preparation for ADL/IADL and functional mobility.  OT Frequency: Min 2X/week   Barriers to D/C: Decreased caregiver support  pt lives alone       Co-evaluation              AM-PAC OT "6 Clicks" Daily Activity     Outcome Measure Help from another person eating meals?: A Little Help from another person taking care of personal grooming?: A Little Help from another person toileting, which includes using toliet, bedpan, or urinal?: A Lot Help from another person bathing (including washing, rinsing, drying)?: A Lot Help from another person to put on and taking off regular upper body clothing?: A Little Help from another person to put on and taking off regular lower body clothing?: A Lot 6 Click Score: 15   End of Session Equipment Utilized During Treatment: Gait belt;Oxygen Nurse Communication: Mobility status (need for nausea meds)  Activity Tolerance: Patient tolerated treatment well Patient left: in bed;with call bell/phone within reach;with  bed alarm set;with family/visitor present;Other (comment) (chair positiong)  OT Visit Diagnosis: Unsteadiness on feet (R26.81);Other abnormalities of gait and mobility (R26.89);Muscle weakness (generalized) (M62.81);Other symptoms and signs involving cognitive function;Pain Pain - Right/Left:  (abdomen)                Time: 9198-0221 OT Time Calculation (min): 47 min Charges:  OT General Charges $OT Visit: 1 Visit OT Evaluation $OT Eval Moderate Complexity: 1 Mod OT Treatments $Self Care/Home Management : 23-37 mins  Helene Kelp OTR/L Acute Rehabilitation Services Office: [phone]   Wyn Forster 11/18/2019, 1:50 PM

## 2019-11-18 NOTE — Progress Notes (Signed)
PT Cancellation Note  Patient Details Name: Kathy Irwin MRN: [id number] DOB: 1950/07/02   Cancelled Treatment:    Reason Eval/Treat Not Completed: Patient not medically ready.  Pt nauseated, sleepy, unable to stay awake. 11/18/2019  Ginger Carne., PT Acute Rehabilitation Services [phone]  (pager) [phone]  (office)   Tessie Fass Adalis Gatti 11/18/2019, 5:36 PM

## 2019-11-18 NOTE — Progress Notes (Signed)
NAME:  Kathy Irwin, MRN:  [id number], DOB:  [birth year], LOS: 9 ADMISSION DATE:  11/08/2019, CONSULTATION DATE:  11/10/19 REFERRING MD:  Triad MD, CHIEF COMPLAINT:  Acute encephaolpathy in setting of sepsis, ILD and phenergan   Brief History   69 year old female with obesity, chronic diastolic failure, ITP, chronic hypoxemic respiratory failure on 3 L nasal cannula at baseline secondary to interstitial lung disease followed by Dr. Vaughan Browner in the ILD clinic at pulmonary clinic.  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  History of COVID-19 in September 2020  ->admitted 7/25 w/ RLE cellulitis in spite of 10d Cephalexin also cc: N/V. PCCM asked to see 7/26 when developed worsening delirium   Past Medical History     has a past medical history of Acute respiratory failure with hypoxia ([place]) (12/2018), Diabetes mellitus without complication ([place]), Hypersensitivity pneumonitis ([place]) (12/19/2017), Hypertension, and ILD (interstitial lung disease) ([place]) (12/24/2018).   reports that she has never smoked. She has never used smokeless tobacco.    Significant Hospital Events   11/09/2019/admit: w/ working dx of cellulitis. Lethargic. CT head ordered. ABX started.  7/26 desaturated. sats 70s. BP dropping. Received IVF bolus.  Developed worsening delirium, actually received Ativan for this.  Critical care consulted later for delirium, which later became more lethargy and ongoing fever, moved to the intensive care. 7/27 intubated-->still w/ decreased MS and hypercarbia. Also trying to facilitate CT imaging which showed: "1. Moderate diffuse skin thickening and subcutaneous soft tissue swelling/edema/fluid suggesting cellulitis. No discrete fluid collection to suggest a drainable soft tissue abscess. 2. No CT findings to suggest myofasciitis or pyomyositis.3. No CT findings suspicious for septic arthritis or osteomyelitis". Was briefly pressor dependent  7/28 discoloration of right LE a  little better.  7/29: Failed spontaneous breathing trial became tachypneic.  Still 1.2 L positive since arrival to the intensive care.  Continuing IV diuresis.  Changing propofol to Precedex 7/30 awake. now w/ negative fluid balance. Tolerating PSV of 15 but not ready for extubation. Film actually a little worse. Leg clinically much better. Sed rate and BNP sent to decide of trial of steroids.  8/1 extubated Consults:  11/10/2019-critical care medicine consult  Procedures:  oett 7/27>>> 8/1  Significant Diagnostic Tests:   Ct abd/pelvis 7/25: no acute findings  cth 7/25: 1. No acute intracranial abnormalities. 2. Very mild chronic microvascular ischemic changes in the cerebral white matter, as above.  Le venous doppler 7/25: RIGHT:  - No evidence of deep vein thrombosis in the lower extremity. No indirect  evidence of obstruction proximal to the inguinal ligament.  - Findings appear essentially unchanged compared to previous examination.  interstitial edema throughout calf    LEFT:  - No evidence of common femoral vein obstruction.   R LE ct 7/27: 1. Moderate diffuse skin thickening and subcutaneous soft tissue swelling/edema/fluid suggesting cellulitis. No discrete fluid collection to suggest a drainable soft tissue abscess. 2. No CT findings to suggest myofasciitis or pyomyositis. 3. No CT findings suspicious for septic arthritis or osteomyelitis. 4. Moderate to advanced tricompartmental degenerative changes at the knee joint and mild degenerative changes at the ankle joint.  Micro Data:  7/2 5 - MRSA PCR - neg 7/25- covid - neg BCX2 7/27>>> neg 7/27 resp:neg  Antimicrobials:  vanc 7/25 - 7/28 Zosyn 7/25 - 8/1  Interim history/subjective:   8/3: completed  Abx, afebrile. oob in chair this am. Still with diarrhea and nausea/vomiting. kub yesterday negative for ileus or obstruction. ?  Component of gastroparesis with long standing diabetes (relatively poorly  controlled). Stable for transfer from ICU however. On 3-4L with baseline 3L [place] at home  Objective   Blood pressure (!) 154/88, pulse (!) 113, temperature 98.1 F (36.7 C), temperature source Oral, resp. rate (!) 31, height _0  (1.6 m), weight 120.6 kg, SpO2 93 %.        Intake/Output Summary (Last 24 hours) at 11/18/2019 0815 Last data filed at 11/18/2019 0800 Gross per 24 hour  Intake 1406.66 ml  Output 826 ml  Net 580.66 ml   Filed Weights   11/16/19 0208 11/17/19 0259 11/18/19 0233  Weight: (!) 121.6 kg (!) 124.1 kg 120.6 kg    Examination: General obese woman, no distress, sitting up oob in chair HENT oropharynx moist, strong voice, no stridor, good secretion management, mild intermittent tremor Pulm wheeze to both bases but no wheezing or crackles Card regular, distant, no murmur abd obese, nondistended, positive bowel sounds Neuro awake, alert and interacting.  Confused about the events that led to the hospitalization but reorienting.  Moves all extremities.  Follows commands Ext right lower extremity with some darkness but no erythema or warmth.  No purulent drainage.  Improved   Resolved Hospital Problem list     Assessment & Plan:   Acute hypercarbic respiratory failure superimposed on h/o Chronic hypoxic respiratory failure ILD; likely element of OHS/OSA as well -Chronically on 3 L -sputum neg.  Plan Continue  pulmonary hygiene -Steroids restarted on 7/31 given her ILD.   -Decrease to 20 mg daily for the next 2 days and then stop (stop date entered) -Hold diuretics given evolving renal failure  Severe sepsis 2/2 RLE cellulitis  -CT imaging without evidence of myocytitis, pyomyositis, or drainable fluid collection; marked areas actually improved when comparing film exam on 7/27  plan Days of Zosyn completed on 8/1 Wound care  Acute renal insufficiency, likely from aggressive diuresis, worse on 8/2 Plan Lasix on hold Follow urine output and BMP -indices  cont to climb unfortuantely -uop adequate but less as well -urine studies, fractionated urea with recent lasix dosing and renal u/s pending.  Avoid nephrotoxins -cont ivf with adequate LV function. No RV dysfunction noted on echo to assume offloading would be beneficial  Fluid and Electrolyte Imbalance: Hypernatremia and  hypokalemia   Plan Resolved  N/v:  -prn zofran -reglan -may benefit from emptying study if able to tolerate  Acute metabolic encephalopathy: Continues to improve Plan Precedex off Continue to correct metabolic disarray  Worsening thrombocytopenia w/ H/o ITP - followed by Dr Irene Limbo.  Platelets have been dropping slowly as of 7/28  plan Following CBC  T2DM with Hyperglycemia Plan Sliding-scale insulin as per protocol  Nutrition Plan Unable to tolerate po at this time -cont reglan  Best practice:  Diet: able to take clears but still having issues with n/v Pain/Anxiety/Delirium protocol (if indicated): N/A VAP protocol (if indicated): x DVT prophylaxis: Enoxaparin GI prophylaxis: Pepcid Glucose control: ssi Mobility: bed rest Code Status: full code Family Communication: pending Disposition: stable for transfer from ICU. We will ask TRH to assume care and CCM will sign off at that time.     LABS    PULMONARY Recent Labs  Lab 11/11/19 1245 11/14/19 0307  PHART 7.341* 7.489*  PCO2ART 59.6* 42.5  PO2ART 271* 67*  HCO3 32.2* 32.4*  TCO2 34* 34*  O2SAT 100.0 94.0    CBC Recent Labs  Lab 11/15/19 0114 11/17/19 0600 11/18/19 0227  HGB 11.2* 11.8*  11.0*  HCT 34.5* 38.3 34.1*  WBC 6.5 7.5 8.6  PLT 59* 99* 152    COAGULATION No results for input(s): INR in the last 168 hours.  CARDIAC  No results for input(s): TROPONINI in the last 168 hours. No results for input(s): PROBNP in the last 168 hours.   CHEMISTRY Recent Labs  Lab 11/11/19 1245 11/11/19 1628 11/12/19 0555 11/12/19 1633 11/13/19 0854 11/15/19 0114 11/15/19 0114  11/16/19 0825 11/16/19 0825 11/17/19 0307 11/17/19 0307 11/17/19 1602 11/18/19 0227  NA   < >  --  147*  --    < > 147*  --  143  --  142  --  141 139  K   < >  --  3.3*  --    < > 4.0   < > 3.4*   < > 4.4   < > 3.9 3.5  CL  --   --  113*  --    < > 108  --  100  --  102  --  96* 96*  CO2  --   --  23  --    < > 27  --  26  --  27  --  27 27  GLUCOSE  --   --  142*  --    < > 197*  --  292*  --  138*  --  164* 153*  BUN  --   --  8  --    < > 41*  --  59*  --  79*  --  84* 91*  CREATININE  --   --  0.55  --    < > 1.26*  --  1.71*  --  2.42*  --  2.74* 3.16*  CALCIUM  --   --  9.1  --    < > 10.7*  --  10.2  --  10.1  --  10.2 9.8  MG  --  1.9 1.9 1.6*  --   --   --   --   --  2.1  --   --  2.2  PHOS  --  1.4* 1.2* 3.6  --   --   --   --   --  7.5*  --   --  8.4*   < > = values in this interval not displayed.   Estimated Creatinine Clearance: 21.1 mL/min (A) (by C-G formula based on SCr of 3.16 mg/dL (H)).   LIVER Recent Labs  Lab 11/12/19 0555 11/14/19 0143 11/15/19 0114  AST _0 ALT _1 ALKPHOS 35* 41 46  BILITOT 0.8 1.2 0.9  PROT 5.4* 6.2* 6.3*  ALBUMIN 2.7* 3.0* 2.9*     INFECTIOUS Recent Labs  Lab 11/12/19 0555  PROCALCITON <0.10     ENDOCRINE CBG (last 3)  Recent Labs    11/17/19 2244 11/18/19 0409 11/18/19 0712  GLUCAP 224* 126* 116*      care time 35 mins. This represents my time independent of the NPs time taking care of the pt. This is excluding procedures.    Audria Nine DO [facility] 11/18/2019, 8:15 AM

## 2019-11-18 NOTE — Plan of Care (Signed)
  Problem: Education: Goal: Knowledge of General Education information will improve Description: Including pain rating scale, medication(s)/side effects and non-pharmacologic comfort measures Outcome: Progressing   Problem: Health Behavior/Discharge Planning: Goal: Ability to manage health-related needs will improve Outcome: Progressing   Problem: Clinical Measurements: Goal: Ability to maintain clinical measurements within normal limits will improve Outcome: Progressing Goal: Will remain free from infection Outcome: Progressing Goal: Diagnostic test results will improve Outcome: Progressing Goal: Respiratory complications will improve Outcome: Progressing Goal: Cardiovascular complication will be avoided Outcome: Progressing   Problem: Activity: Goal: Risk for activity intolerance will decrease Outcome: Progressing   Problem: Nutrition: Goal: Adequate nutrition will be maintained Outcome: Progressing   Problem: Coping: Goal: Level of anxiety will decrease Outcome: Progressing   Problem: Elimination: Goal: Will not experience complications related to bowel motility Outcome: Progressing Goal: Will not experience complications related to urinary retention Outcome: Progressing   Problem: Pain Managment: Goal: General experience of comfort will improve Outcome: Progressing   Problem: Safety: Goal: Ability to remain free from injury will improve Outcome: Progressing   Problem: Skin Integrity: Goal: Risk for impaired skin integrity will decrease Outcome: Progressing   Problem: Activity: Goal: Ability to tolerate increased activity will improve Outcome: Progressing   Problem: Respiratory: Goal: Ability to maintain a clear airway and adequate ventilation will improve Outcome: Progressing   Problem: Role Relationship: Goal: Method of communication will improve Outcome: Progressing   Problem: Clinical Measurements: Goal: Ability to avoid or minimize complications  of infection will improve Outcome: Progressing   Problem: Skin Integrity: Goal: Skin integrity will improve Outcome: Progressing

## 2019-11-18 NOTE — Progress Notes (Signed)
Patient was transferred 5M03 via bed with O2 at 3 ln/c . She tolerated the transfer well . I did call and leave message for her daughter [name] that we had moved her

## 2019-11-18 NOTE — Progress Notes (Signed)
Nutrition Follow-up  DOCUMENTATION CODES:   Morbid obesity  INTERVENTION:    Boost Breeze po TID, each supplement provides 250 kcal and 9 grams of protein  NUTRITION DIAGNOSIS:   Inadequate oral intake related to nausea, vomiting as evidenced by per patient/family report.  Ongoing   GOAL:   Patient will meet greater than or equal to 90% of their needs  Unmet  MONITOR:   Diet advancement, PO intake, Supplement acceptance, Labs, Skin  REASON FOR ASSESSMENT:   Ventilator, Consult Enteral/tube feeding initiation and management  ASSESSMENT:   69 yo female admitted with septic shock, cellulitis R leg. PMH includes ILD-baseline 3 L [place], obesity, ITP, HF, COVID-19.  Discussed patient in ICU rounds and with RN today. Extubated 8/1. TF off since extubation. Diet advanced to clear liquids 8/2. Patient with ongoing nausea and vomiting with no plans to advance diet today. Will add clear liquid PO supplements.  Transferring out of the ICU today.  Labs reviewed. BUN 91, creat 3.16, phos 8.4 CBG: 126-116-135  Medications reviewed and include colace, novolog, reglan, miralax, prednisone. IVF: LR at 50 ml/h  Weight down to 120.6 kg today; 125.9 kg on admission   Diet Order:   Diet Order            Diet clear liquid Room service appropriate? Yes; Fluid consistency: Thin  Diet effective now                 EDUCATION NEEDS:   Not appropriate for education at this time  Skin:  Skin Assessment: Reviewed RN Assessment (R leg cellulitis)  Last BM:  8/3  Height:   Ht Readings from Last 1 Encounters:  11/11/19 _0  (1.6 m)    Weight:   Wt Readings from Last 1 Encounters:  11/18/19 120.6 kg    Ideal Body Weight:  52.3 kg  BMI:  Body mass index is 47.1 kg/m.  Estimated Nutritional Needs:   Kcal:  1700-2000  Protein:  110-130 gm  Fluid:  >/= 1.7 L    Lucas Mallow, RD, LDN, CNSC Please refer to Amion for contact information.

## 2019-11-18 NOTE — Progress Notes (Signed)
Updated family at bedside.  Allquestions answered to best of my ability  Renal u/s revealed mild bilateral hydronephrosis. Will order indwelling foley -cont to await urine studies -monitor bmp  Check liver panel Check ruq u/s with persistent n/c and gallstone seen on renal u/s  They have also expressed conern about her mental status. She is delerious intermittently. ? 2/2 icu delirium vs bun>90. Suspect multifactorial and disposition out of ICU would improve it if BUN is able to decrease as well.  -reviewed home meds and found no offenders -pt's family and pt endorse she does not drink etoh, use illicit substances or smokes.   Lastly, they are concerned about her faint tremor. I have noted this on my exam this am but am unsure etiology. She is currently off her lyrica which she utilizes at home (2/2 renal dysfunction). ? If could be contributing.   Of note reglan was started well after tremor started earlier this hospitalization.

## 2019-11-19 LAB — BASIC METABOLIC PANEL
Anion gap: 12 (ref 5–15)
BUN: 65 mg/dL — ABNORMAL HIGH (ref 8–23)
CO2: 32 mmol/L (ref 22–32)
Calcium: 10.5 mg/dL — ABNORMAL HIGH (ref 8.9–10.3)
Chloride: 109 mmol/L (ref 98–111)
Creatinine, Ser: 2.24 mg/dL — ABNORMAL HIGH (ref 0.44–1.00)
GFR calc Af Amer: 25 mL/min — ABNORMAL LOW (ref >60)
GFR calc non Af Amer: 22 mL/min — ABNORMAL LOW (ref >60)
Glucose, Bld: 107 mg/dL — ABNORMAL HIGH (ref 70–99)
Potassium: 3.6 mmol/L (ref 3.5–5.1)
Sodium: 153 mmol/L — ABNORMAL HIGH (ref 135–145)

## 2019-11-19 LAB — CBC
HCT: 34.8 % — ABNORMAL LOW (ref 36.0–46.0)
Hemoglobin: 10.6 g/dL — ABNORMAL LOW (ref 12.0–15.0)
MCH: 30.5 pg (ref 26.0–34.0)
MCHC: 30.5 g/dL (ref 30.0–36.0)
MCV: 100.3 fL — ABNORMAL HIGH (ref 80.0–100.0)
Platelets: 183 10*3/uL (ref 150–400)
RBC: 3.47 MIL/uL — ABNORMAL LOW (ref 3.87–5.11)
RDW: 18.1 % — ABNORMAL HIGH (ref 11.5–15.5)
WBC: 5.5 10*3/uL (ref 4.0–10.5)
nRBC: 0 % (ref 0.0–0.2)

## 2019-11-19 LAB — GLUCOSE, CAPILLARY
Glucose-Capillary: 102 mg/dL — ABNORMAL HIGH (ref 70–99)
Glucose-Capillary: 120 mg/dL — ABNORMAL HIGH (ref 70–99)
Glucose-Capillary: 170 mg/dL — ABNORMAL HIGH (ref 70–99)
Glucose-Capillary: 245 mg/dL — ABNORMAL HIGH (ref 70–99)
Glucose-Capillary: 260 mg/dL — ABNORMAL HIGH (ref 70–99)
Glucose-Capillary: 93 mg/dL (ref 70–99)

## 2019-11-19 LAB — UREA NITROGEN, URINE: Urea Nitrogen, Ur: 293 mg/dL

## 2019-11-19 MED ORDER — HYDRALAZINE HCL 20 MG/ML IJ SOLN: 10.0000 mg | INTRAMUSCULAR | Status: DC | PRN

## 2019-11-19 MED ORDER — DEXTROSE-NACL 5-0.45 % IV SOLN: INTRAVENOUS | Status: DC

## 2019-11-19 MED ORDER — DEXTROSE-NACL 5-0.2 % IV SOLN: INTRAVENOUS | Status: DC

## 2019-11-19 NOTE — Progress Notes (Signed)
PROGRESS NOTE    Kathy Irwin  MWU:[id number] DOB: 1951/04/01 DOA: 11/08/2019 PCP: Kerin Perna, NP  Brief Narrative: 69 year old female with obesity, chronic diastolic failure, ITP, chronic hypoxemic respiratory failure on 3 L nasal cannula at baseline secondary to interstitial lung disease followed by Dr. Vaughan Browner in the ILD clinic at pulmonary clinic.  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  History of COVID-19 in September 2020 ->admitted 7/25 w/ RLE cellulitis in spite of 10d Cephalexin also cc: N/V.  The following day developed encephalopathy and hypercarbia, was transferred to the ICU and intubated, diuresed aggressively in the ICU subsequently developed acute renal failure -Eventually extubated on 8/1 -Kidney function continued to worsen from normal range up to 3, started on IV fluids on 8/3 -Transferred from PCCM to [facility] 8/4   Assessment & Plan:   Acute hypercarbic respiratory failure Chronic hypoxic respiratory failure/ILD Suspected OHS/OSA Acute on chronic diastolic CHF Sepsis -Status post ventilator dependent respiratory failure, intubated 7/26 and extubated 8/1 -Diuresed aggressively in the ICU -Diuretics held due to progressive renal failure -Now stable -Caution with oversedation -Will need pulmonary follow-up and sleep study   Sepsis, right lower extremity cellulitis -Failed outpatient 10-day course of Keflex -CT noted cellulitis, without evidence of myositis or drainable fluid collection -Treated with 4 days of IV vancomycin till 7/28 and 8 days of IV Zosyn till 8/1 -Completed antibiotic course, clinically improving -Cultures negative  Acute kidney injury -From aggressive diuresis, and mild component of hydronephrosis -Baseline creatinine of 0.9, creatinine trended up to 3.1 with overdiuresis and sepsis -Now improving, continue gentle fluids for another day -Diuretics on hold -Foley catheter placed in the ICU 8/3 due to  hydronephrosis in the setting of AKI  Metabolic encephalopathy -In the setting of sepsis and hypercarbia -Improving, mild tremors noted which could be from elevated BUN and Lyrica withdrawal -Resume Lyrica tomorrow if kidney function continues to improve  History of ITP -Platelet count has normalized  Type 2 diabetes mellitus Obesity  Gallstones -Noted on ultrasound yesterday, denies any pain, no right upper quadrant tenderness -Monitor clinically  Chronic respiratory failure Interstitial lung disease -On 3 L home O2 at baseline, continue same   DVT prophylaxis: Lovenox Code Status: Full code Family Communication: Discussed with patient in detail, no family at bedside Disposition Plan:  Status is: Inpatient  Remains inpatient appropriate because:Inpatient level of care appropriate due to severity of illness   Dispo: The patient is from: Home              Anticipated d/c is to: Home              Anticipated d/c date is: 3 days              Patient currently is not medically stable to d/c.  Consultants:   PCCM Transfer   Procedures:   Antimicrobials:    Subjective: -Feels a little better overall, mild nausea this morning  Objective: Vitals:   11/18/19 2034 11/19/19 0500 11/19/19 0501 11/19/19 1031  BP: 132/68  130/70 (!) 145/73  Pulse: (!) 107  86 (!) 108  Resp: _0 Temp: 98 F (36.7 C)  98 F (36.7 C) 98.4 F (36.9 C)  TempSrc: Oral  Oral Oral  SpO2:   98% 97%  Weight:  120.6 kg    Height:        Intake/Output Summary (Last 24 hours) at 11/19/2019 1128 Last data filed at 11/19/2019 0900  Gross per 24 hour  Intake 1456.38 ml  Output 4100 ml  Net -2643.62 ml   Filed Weights   11/17/19 0259 11/18/19 0233 11/19/19 0500  Weight: (!) 124.1 kg 120.6 kg 120.6 kg    Examination:  General exam: Obese pleasant female sitting up in bed, awake alert oriented to self place Respiratory system: Decreased breath sounds to bases, otherwise  clear Cardiovascular system: S1 & S2 heard, RRR.  Gastrointestinal system: Abdomen is nondistended, soft and nontender.Normal bowel sounds heard. Central nervous system: Alert and oriented, mild tremors noted, no asterixis Extremities: Mild right leg edema, erythema and discoloration in her right lower leg Skin: As above Psychiatry:  Mood & affect appropriate.     Data Reviewed:   CBC: Recent Labs  Lab 11/14/19 0143 11/14/19 0143 11/14/19 0307 11/15/19 0114 11/17/19 0600 11/18/19 0227 11/19/19 0627  WBC 4.0  --   --  6.5 7.5 8.6 5.5  HGB 11.6*   < > 11.2* 11.2* 11.8* 11.0* 10.6*  HCT 35.3*   < > 33.0* 34.5* 38.3 34.1* 34.8*  MCV 98.3  --   --  98.9 100.3* 98.6 100.3*  PLT 59*  --   --  59* 99* 152 183   < > = values in this interval not displayed.   Basic Metabolic Panel: Recent Labs  Lab 11/12/19 1633 11/13/19 0854 11/16/19 0825 11/17/19 6378 11/17/19 1602 11/18/19 0227 11/19/19 0627  NA  --    < > 143 142 141 139 153*  K  --    < > 3.4* 4.4 3.9 3.5 3.6  CL  --    < > 100 102 96* 96* 109  CO2  --    < > _0 32  GLUCOSE  --    < > 292* 138* 164* 153* 107*  BUN  --    < > 59* 79* 84* 91* 65*  CREATININE  --    < > 1.71* 2.42* 2.74* 3.16* 2.24*  CALCIUM  --    < > 10.2 10.1 10.2 9.8 10.5*  MG 1.6*  --   --  2.1  --  2.2  --   PHOS 3.6  --   --  7.5*  --  8.4*  --    < > = values in this interval not displayed.   GFR: Estimated Creatinine Clearance: 29.8 mL/min (A) (by C-G formula based on SCr of 2.24 mg/dL (H)). Liver Function Tests: Recent Labs  Lab 11/14/19 0143 11/15/19 0114 11/18/19 1523  AST _1 ALT _2 ALKPHOS 41 46 41  BILITOT 1.2 0.9 1.2  PROT 6.2* 6.3* 6.7  ALBUMIN 3.0* 2.9* 3.4*   No results for input(s): LIPASE, AMYLASE in the last 168 hours. No results for input(s): AMMONIA in the last 168 hours. Coagulation Profile: No results for input(s): INR, PROTIME in the last 168 hours. Cardiac Enzymes: No results for  input(s): CKTOTAL, CKMB, CKMBINDEX, TROPONINI in the last 168 hours. BNP (last 3 results) Recent Labs    03/03/19 1151  PROBNP 70.0   HbA1C: No results for input(s): HGBA1C in the last 72 hours. CBG: Recent Labs  Lab 11/18/19 2033 11/19/19 0034 11/19/19 0428 11/19/19 0722 11/19/19 1124  GLUCAP 158* 120* 102* 93 170*   Lipid Profile: No results for input(s): CHOL, HDL, LDLCALC, TRIG, CHOLHDL, LDLDIRECT in the last 72 hours. Thyroid Function Tests: No results for input(s): TSH, T4TOTAL, FREET4, T3FREE, THYROIDAB in the last 72 hours. Anemia  Panel: No results for input(s): VITAMINB12, FOLATE, FERRITIN, TIBC, IRON, RETICCTPCT in the last 72 hours. Urine analysis:    Component Value Date/Time   COLORURINE YELLOW 11/09/2019 1236   APPEARANCEUR CLOUDY (A) 11/09/2019 1236   LABSPEC 1.021 11/09/2019 1236   PHURINE 7.0 11/09/2019 1236   GLUCOSEU NEGATIVE 11/09/2019 1236   HGBUR NEGATIVE 11/09/2019 1236   BILIRUBINUR NEGATIVE 11/09/2019 1236   KETONESUR NEGATIVE 11/09/2019 1236   PROTEINUR NEGATIVE 11/09/2019 1236   UROBILINOGEN 1.0 08/14/2019 1824   NITRITE NEGATIVE 11/09/2019 1236   LEUKOCYTESUR NEGATIVE 11/09/2019 1236   Sepsis Labs: _0 (procalcitonin:4,lacticidven:4)  ) Recent Results (from the past 240 hour(s))  MRSA PCR Screening     Status: None   Collection Time: 11/10/19  5:45 AM   Specimen: Nasal Mucosa; Nasopharyngeal  Result Value Ref Range Status   MRSA by PCR NEGATIVE NEGATIVE Final    Comment:        The GeneXpert MRSA Assay (FDA approved for NASAL specimens only), is one component of a comprehensive MRSA colonization surveillance program. It is not intended to diagnose MRSA infection nor to guide or monitor treatment for MRSA infections. Performed at [facility], [place] [address] [place], [place] [address]   Culture, respiratory (non-expectorated)     Status: None   Collection Time: 11/11/19 11:59 AM   Specimen: Tracheal Aspirate;  Respiratory  Result Value Ref Range Status   Specimen Description TRACHEAL ASPIRATE  Final   Special Requests NONE  Final   Gram Stain RARE WBC SEEN NO ORGANISMS SEEN   Final   Culture   Final    RARE Consistent with normal respiratory flora. Performed at [facility], [place] [address], [place], [place] [address]    Report Status 11/13/2019 FINAL  Final         Radiology Studies: DG Abd 1 View  Result Date: 11/17/2019 CLINICAL DATA:  Pain with urination. EXAM: ABDOMEN - 1 VIEW COMPARISON:  CT dated November 09, 2019 FINDINGS: The bowel gas pattern is nonobstructive. There are no concerning radiopaque kidney stones. There are advanced degenerative changes of the right hip. IMPRESSION: Negative. Electronically Signed   By: Constance Holster M.D.   On: 11/17/2019 21:32   US RENAL  Result Date: 11/18/2019 CLINICAL DATA:  Acute renal failure. EXAM: RENAL / URINARY TRACT ULTRASOUND COMPLETE COMPARISON:  November 09, 2019. FINDINGS: Right Kidney: Renal measurements: 13.2 x 5.9 x 5.6 cm = volume: 229 mL. Mild hydronephrosis is noted. Echogenicity within normal limits. No mass visualized. Left Kidney: Renal measurements: 12.0 x 6.3 x 6.5 cm = volume: 256 mL. Mild hydronephrosis is noted. Echogenicity within normal limits. No mass visualized. Bladder: Appears normal for degree of bladder distention. Ureteral jets are not visualized. Other: Gallstone is noted. IMPRESSION: Mild bilateral hydronephrosis is noted. Electronically Signed   By: Marijo Conception M.D.   On: 11/18/2019 11:37   US Abdomen Limited RUQ  Result Date: 11/18/2019 CLINICAL DATA:  Cholecystitis EXAM: ULTRASOUND ABDOMEN LIMITED RIGHT UPPER QUADRANT COMPARISON:  CT dated November 08, 2013 FINDINGS: Gallbladder: The gallbladder is filled with stones. The gallbladder wall is mildly thickened at 4 mm. There is no pericholecystic free fluid. The sonographic Percell Miller sign cannot be adequately assessed on this study. The gallbladder wall again appears  to be calcified. Common bile duct: Diameter: Cannot be visualized Liver: The echogenicity is increased. There is no discrete hepatic mass. Portal vein is patent on color Doppler imaging with normal direction of blood flow towards  the liver. Other: None IMPRESSION: 1. Cholelithiasis with mild gallbladder wall thickening. The sonographic Percell Miller sign cannot be adequately assessed secondary to patient condition. If there is high clinical suspicion for acute cholecystitis, follow-up with HIDA scan is recommended. 2. Probable hepatic steatosis. Electronically Signed   By: Constance Holster M.D.   On: 11/18/2019 18:21        Scheduled Meds: . chlorhexidine  15 mL Mouth Rinse BID  . Chlorhexidine Gluconate Cloth  6 each Topical Daily  . docusate sodium  100 mg Oral BID  . enoxaparin (LOVENOX) injection  30 mg Subcutaneous Q24H  . famotidine  40 mg Oral Daily  . feeding supplement  1 Container Oral TID BM  . insulin aspart  0-9 Units Subcutaneous Q4H  . mouth rinse  15 mL Mouth Rinse q12n4p  . multivitamin with minerals  1 tablet Oral Daily  . polyethylene glycol  17 g Oral Daily  . sertraline  100 mg Oral QHS  . sodium chloride flush  3 mL Intravenous Once   Continuous Infusions: . dextrose 5 % and 0.45% NaCl 75 mL/hr at 11/19/19 0849     LOS: 10 days    Time spent: 45mn   PDomenic Polite MD [facility] Page via [email or web address], password [email or web address] After 7PM please contact night-coverage  11/19/2019, 11:28 AM

## 2019-11-19 NOTE — Evaluation (Signed)
Physical Therapy Evaluation Patient Details Name: Kathy Irwin MRN: [id number] DOB: [birth year] Today's Date: 11/19/2019   History of Present Illness  Pt is a 69 year old female presents 7/25 with dx of RLE cellulitis. 7/26 pt developed worsening delirium and desaturated requiring intubation 7/27, pt extubated 8/1. PMH obesity, chronic diastolic failure, ITP, chronic hypoxemic respiratory failure on 3 L nasal cannula at baseline secondary to interstitial lung disease. 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  History of COVID-19 in September 2020.    Clinical Impression  Pt presented sitting upright at EOB, awake and willing to participate in therapy session. Prior to admission, pt reported that she was independent with all functional mobility and ADLs. At the time of evaluation, pt very limited overall secondary to weakness. She required heavy two person physical assistance for bed mobility and transfers using the STEDY. She performed multiple sit<>stand transfers from STEDY pads with min-mod A x2. Pt is an excellent candidate for further intensive therapy services at [facility] to maximize her independence with functional mobility prior to returning home with family support. Pt would continue to benefit from skilled physical therapy services at this time while admitted and after d/c to address the below listed limitations in order to improve overall safety and independence with functional mobility.     Follow Up Recommendations [facility]    Equipment Recommendations  None recommended by PT    Recommendations for Other Services       Precautions / Restrictions Precautions Precautions: Fall Precaution Comments: rectal tube, foley Restrictions Weight Bearing Restrictions: No      Mobility  Bed Mobility Overal bed mobility: Needs Assistance Bed Mobility: Sit to Supine       Sit to supine: Max assist;+2 for physical assistance   General bed mobility comments: assistance  needed for bilateral LE management back onto bed and for trunk management  Transfers Overall transfer level: Needs assistance   Transfers: Sit to/from Stand Sit to Stand: Mod assist;+2 physical assistance;+2 safety/equipment         General transfer comment: cueing for technique, assistance needed to power into standing from EOB x1 and from STEDY pads x5 to work on bilateral LE strengthening  Ambulation/Gait                Stairs            Wheelchair Mobility    Modified Rankin (Stroke Patients Only)       Balance Overall balance assessment: Needs assistance Sitting-balance support: No upper extremity supported;Feet supported Sitting balance-Leahy Scale: Fair     Standing balance support: Bilateral upper extremity supported Standing balance-Leahy Scale: Poor Standing balance comment: heavy reliance on bilateral UEs                             Pertinent Vitals/Pain Pain Assessment: No/denies pain    Home Living Family/patient expects to be discharged to:: Private residence Living Arrangements: Alone Available Help at Discharge: Available PRN/intermittently;Family Type of Home: House Home Access: Stairs to enter Entrance Stairs-Rails: Psychiatric nurse of Steps: 3 Home Layout: One level Home Equipment: Shower seat      Prior Function Level of Independence: Independent               Hand Dominance        Extremity/Trunk Assessment   Upper Extremity Assessment Upper Extremity Assessment: Defer to OT evaluation    Lower Extremity Assessment Lower  Extremity Assessment: Generalized weakness       Communication   Communication: No difficulties  Cognition Arousal/Alertness: Awake/alert Behavior During Therapy: Flat affect Overall Cognitive Status: Impaired/Different from baseline Area of Impairment: Orientation;Attention;Memory;Following commands;Safety/judgement;Awareness;Problem solving                  Orientation Level: Disoriented to;Situation;Time Current Attention Level: Focused Memory: Decreased short-term memory Following Commands: Follows one step commands inconsistently;Follows one step commands with increased time Safety/Judgement: Decreased awareness of safety;Decreased awareness of deficits Awareness: Intellectual Problem Solving: Slow processing;Decreased initiation;Difficulty sequencing;Requires verbal cues;Requires tactile cues        General Comments      Exercises     Assessment/Plan    PT Assessment Patient needs continued PT services  PT Problem List Decreased strength;Decreased range of motion;Decreased activity tolerance;Decreased balance;Decreased mobility;Decreased coordination;Decreased cognition;Decreased knowledge of use of DME;Decreased safety awareness;Decreased knowledge of precautions       PT Treatment Interventions DME instruction;Gait training;Functional mobility training;Stair training;Therapeutic activities;Therapeutic exercise;Balance training;Neuromuscular re-education;Cognitive remediation;Patient/family education    PT Goals (Current goals can be found in the Care Plan section)  Acute Rehab PT Goals Patient Stated Goal: to lay back down in bed PT Goal Formulation: With patient Time For Goal Achievement: 12/03/19 Potential to Achieve Goals: Good    Frequency Min 3X/week   Barriers to discharge        Co-evaluation               AM-PAC PT "6 Clicks" Mobility  Outcome Measure Help needed turning from your back to your side while in a flat bed without using bedrails?: A Little Help needed moving from lying on your back to sitting on the side of a flat bed without using bedrails?: A Lot Help needed moving to and from a bed to a chair (including a wheelchair)?: Total Help needed standing up from a chair using your arms (e.g., wheelchair or bedside chair)?: A Lot Help needed to walk in hospital room?: Total Help needed  climbing 3-5 steps with a railing? : Total 6 Click Score: 10    End of Session Equipment Utilized During Treatment: Gait belt;Oxygen Activity Tolerance: Patient limited by fatigue Patient left: in bed;with call bell/phone within reach;with bed alarm set Nurse Communication: Mobility status;Need for lift equipment PT Visit Diagnosis: Other abnormalities of gait and mobility (R26.89);Muscle weakness (generalized) (M62.81)    Time: 5397-6734 PT Time Calculation (min) (ACUTE ONLY): 20 min   Charges:   PT Evaluation $PT Eval Moderate Complexity: 1 Mod          Eduard Clos, PT, DPT  Acute Rehabilitation Services Pager [phone] Office [place] 11/19/2019, 4:55 PM

## 2019-11-20 LAB — COMPREHENSIVE METABOLIC PANEL
ALT: 14 U/L (ref 0–44)
AST: 14 U/L — ABNORMAL LOW (ref 15–41)
Albumin: 3 g/dL — ABNORMAL LOW (ref 3.5–5.0)
Alkaline Phosphatase: 36 U/L — ABNORMAL LOW (ref 38–126)
Anion gap: 10 (ref 5–15)
BUN: 23 mg/dL (ref 8–23)
CO2: 29 mmol/L (ref 22–32)
Calcium: 10.2 mg/dL (ref 8.9–10.3)
Chloride: 111 mmol/L (ref 98–111)
Creatinine, Ser: 0.93 mg/dL (ref 0.44–1.00)
GFR calc Af Amer: >60 mL/min (ref >60)
GFR calc non Af Amer: >60 mL/min (ref >60)
Glucose, Bld: 134 mg/dL — ABNORMAL HIGH (ref 70–99)
Potassium: 2.7 mmol/L — CL (ref 3.5–5.1)
Sodium: 150 mmol/L — ABNORMAL HIGH (ref 135–145)
Total Bilirubin: 0.9 mg/dL (ref 0.3–1.2)
Total Protein: 6.1 g/dL — ABNORMAL LOW (ref 6.5–8.1)

## 2019-11-20 LAB — BASIC METABOLIC PANEL
Anion gap: 15 (ref 5–15)
BUN: 24 mg/dL — ABNORMAL HIGH (ref 8–23)
CO2: 25 mmol/L (ref 22–32)
Calcium: 10.3 mg/dL (ref 8.9–10.3)
Chloride: 115 mmol/L — ABNORMAL HIGH (ref 98–111)
Creatinine, Ser: 1.1 mg/dL — ABNORMAL HIGH (ref 0.44–1.00)
GFR calc Af Amer: 59 mL/min — ABNORMAL LOW (ref >60)
GFR calc non Af Amer: 51 mL/min — ABNORMAL LOW (ref >60)
Glucose, Bld: 130 mg/dL — ABNORMAL HIGH (ref 70–99)
Potassium: 2.8 mmol/L — ABNORMAL LOW (ref 3.5–5.1)
Sodium: 155 mmol/L — ABNORMAL HIGH (ref 135–145)

## 2019-11-20 LAB — GLUCOSE, CAPILLARY
Glucose-Capillary: 119 mg/dL — ABNORMAL HIGH (ref 70–99)
Glucose-Capillary: 129 mg/dL — ABNORMAL HIGH (ref 70–99)
Glucose-Capillary: 132 mg/dL — ABNORMAL HIGH (ref 70–99)
Glucose-Capillary: 139 mg/dL — ABNORMAL HIGH (ref 70–99)
Glucose-Capillary: 163 mg/dL — ABNORMAL HIGH (ref 70–99)
Glucose-Capillary: 212 mg/dL — ABNORMAL HIGH (ref 70–99)

## 2019-11-20 LAB — CBC
HCT: 34 % — ABNORMAL LOW (ref 36.0–46.0)
Hemoglobin: 10.5 g/dL — ABNORMAL LOW (ref 12.0–15.0)
MCH: 31.6 pg (ref 26.0–34.0)
MCHC: 30.9 g/dL (ref 30.0–36.0)
MCV: 102.4 fL — ABNORMAL HIGH (ref 80.0–100.0)
Platelets: 190 10*3/uL (ref 150–400)
RBC: 3.32 MIL/uL — ABNORMAL LOW (ref 3.87–5.11)
RDW: 17.8 % — ABNORMAL HIGH (ref 11.5–15.5)
WBC: 3.7 10*3/uL — ABNORMAL LOW (ref 4.0–10.5)
nRBC: 0 % (ref 0.0–0.2)

## 2019-11-20 LAB — MAGNESIUM: Magnesium: 1.4 mg/dL — ABNORMAL LOW (ref 1.7–2.4)

## 2019-11-20 MED ORDER — ENOXAPARIN SODIUM 40 MG/0.4ML ~~LOC~~ SOLN
40.0000 mg | Freq: Once | SUBCUTANEOUS | Status: AC
  Administered 2019-11-20: 40 mg via SUBCUTANEOUS
  Filled 2019-11-20: qty 0.4

## 2019-11-20 MED ORDER — LOPERAMIDE HCL 2 MG PO CAPS
2.0000 mg | ORAL_CAPSULE | Freq: Once | ORAL | Status: AC
  Administered 2019-11-20: 2 mg via ORAL
  Filled 2019-11-20: qty 1

## 2019-11-20 MED ORDER — POTASSIUM CHLORIDE CRYS ER 20 MEQ PO TBCR
40.0000 meq | EXTENDED_RELEASE_TABLET | Freq: Once | ORAL | Status: AC
  Administered 2019-11-20: 40 meq via ORAL
  Filled 2019-11-20: qty 2

## 2019-11-20 MED ORDER — PREGABALIN 50 MG PO CAPS
50.0000 mg | ORAL_CAPSULE | Freq: Every evening | ORAL | Status: DC
  Administered 2019-11-20 – 2019-12-01 (×11): 50 mg via ORAL
  Filled 2019-11-20 (×11): qty 1

## 2019-11-20 MED ORDER — ENOXAPARIN SODIUM 60 MG/0.6ML ~~LOC~~ SOLN
60.0000 mg | SUBCUTANEOUS | Status: DC
  Administered 2019-11-21 – 2019-11-30 (×10): 60 mg via SUBCUTANEOUS
  Filled 2019-11-20 (×11): qty 0.6

## 2019-11-20 MED ORDER — KCL IN DEXTROSE-NACL 40-5-0.45 MEQ/L-%-% IV SOLN
INTRAVENOUS | Status: DC
  Filled 2019-11-20: qty 1000

## 2019-11-20 MED ORDER — POTASSIUM CHLORIDE CRYS ER 20 MEQ PO TBCR
40.0000 meq | EXTENDED_RELEASE_TABLET | Freq: Two times a day (BID) | ORAL | Status: DC
  Administered 2019-11-20 (×2): 40 meq via ORAL
  Filled 2019-11-20 (×2): qty 2

## 2019-11-20 MED ORDER — POTASSIUM CL IN DEXTROSE 5% 20 MEQ/L IV SOLN
20.0000 meq | INTRAVENOUS | Status: DC
  Administered 2019-11-20: 20 meq via INTRAVENOUS
  Filled 2019-11-20: qty 1000

## 2019-11-20 MED ORDER — WHITE PETROLATUM EX OINT
TOPICAL_OINTMENT | CUTANEOUS | Status: AC
  Administered 2019-11-20: 0.2
  Filled 2019-11-20: qty 28.35

## 2019-11-20 NOTE — Consult Note (Signed)
Physical Medicine and Rehabilitation Consult Reason for Consult: Impaired mobility and ADLs following RLE cellulitis Referring Physician: Domenic Polite, MD   HPI: Kathy Irwin is a 69 y.o. female with PMH of obesity, chronic diastolic failure, ITP, chronic hypoxemic respiratory failure on 3L nasal cannula, secondary to interstitial lung disease followed by Dr. Vaughan Browner in the ILD clinic, admitted on 7/25 with right lower extremity cellulitis in spite of 10 days of cephalexin and nausea and vomiting. The following day she developed encephalopathy and hypercarbia and was transferred to the ICU and intubated, diuresed aggressively, and subsequently developed acute renal failure. She was extubated on 8/1. Kidney function continued to worsen from normal range up to 3. IV fluids were started on 8/3 and creatinine on 8/5 is 1.10.    Review of Systems  Constitutional: Negative for chills and fever.  HENT: Negative for hearing loss and tinnitus.   Eyes: Negative for blurred vision and double vision.  Respiratory: Negative for cough and hemoptysis.   Cardiovascular: Negative for chest pain and palpitations.  Gastrointestinal: Negative for heartburn and nausea.  Genitourinary: Negative for dysuria and urgency.  Musculoskeletal: Negative for myalgias and neck pain.  Skin: Negative for itching and rash.  Neurological: Positive for tremors and weakness.  Endo/Heme/Allergies: Negative for environmental allergies. Does not bruise/bleed easily.  Psychiatric/Behavioral: Positive for memory loss.   Past Medical History:  Diagnosis Date   Acute respiratory failure with hypoxia ([place]) 12/2018   Diabetes mellitus without complication (HCC)    Hypersensitivity pneumonitis ([place]) 12/19/2017   Hypertension    ILD (interstitial lung disease) ([place]) 12/24/2018   Past Surgical History:  Procedure Laterality Date   ABDOMINAL HYSTERECTOMY     VIDEO BRONCHOSCOPY Bilateral 04/02/2019   Procedure: VIDEO  BRONCHOSCOPY WITH FLUORO;  Surgeon: Marshell Garfinkel, MD;  Location: [facility];  Service: Cardiopulmonary;  Laterality: Bilateral;   Family History  Problem Relation Age of Onset   Diabetes Other    Hypertension Other    Social History:  reports that she has never smoked. She has never used smokeless tobacco. She reports that she does not drink alcohol and does not use drugs. Allergies:  Allergies  Allergen Reactions   Other Shortness Of Breath and Other (See Comments)    Newspaper ink =  new chest pain, also   Iodine Other (See Comments)    "Was a long time ago" (Reaction??)   Merbromin Other (See Comments)    Mercurochrome- "Was a long time ago" (Reaction??)   Tape    Medications Prior to Admission  Medication Sig Dispense Refill   acetaminophen (TYLENOL) 500 MG tablet Take 1,000 mg by mouth 2 (two) times daily as needed (for pain).     albuterol (VENTOLIN HFA) 108 (90 Base) MCG/ACT inhaler Inhale 1-2 puffs into the lungs every 6 (six) hours as needed for wheezing or shortness of breath. 8 g 0   Azelastine-Fluticasone (DYMISTA) 137-50 MCG/ACT SUSP Place 1-2 sprays into both nostrils daily as needed (for allergies). 23 g 1   [employer] D3 125 MCG (5000 UT) capsule Take 5,000 Units by mouth daily.     dextromethorphan 15 MG/5ML syrup Take 10 mLs (30 mg total) by mouth 4 (four) times daily as needed for cough. 118 mL 0   diphenhydrAMINE (BENADRYL) 25 MG tablet Take 1 tablet (25 mg total) by mouth at bedtime as needed for itching. 90 tablet 1   fluticasone (FLONASE) 50 MCG/ACT nasal spray Place 2 sprays into both nostrils daily as needed  for allergies or rhinitis.      furosemide (LASIX) 40 MG tablet Take 40 mg by mouth daily.      glimepiride (AMARYL) 4 MG tablet Take 1 tablet (4 mg total) by mouth daily with breakfast. 90 tablet 1   Insulin Lispro Prot & Lispro (HUMALOG MIX 75/25 KWIKPEN) (75-25) 100 UNIT/ML Kwikpen Inject 80 Units into the skin See admin instructions. Inject  50 units into the skin before breakfast "and a second dose at bedtime 30 units (Patient taking differently: Inject 30-50 Units into the skin See admin instructions. Use 50 units every morning then use 30 units at bedtime) 30 mL 3   levocetirizine (XYZAL) 5 MG tablet Take 1 tablet by mouth daily as needed for allergies.      metoprolol (TOPROL-XL) 200 MG 24 hr tablet Take 1 tablet (200 mg total) by mouth daily. 90 tablet 1   montelukast (SINGULAIR) 10 MG tablet Take 10 mg by mouth at bedtime.      olopatadine (PATANOL) 0.1 % ophthalmic solution Place 1 drop into both eyes 2 (two) times daily. (Patient taking differently: Place 1 drop into both eyes 2 (two) times daily as needed for allergies. ) 5 mL 0   omeprazole (PRILOSEC) 20 MG capsule Take 1 capsule (20 mg total) by mouth daily. (Patient taking differently: Take 20 mg by mouth daily as needed (GERD). ) 30 capsule 0   ondansetron (ZOFRAN) 4 MG tablet Take 1 tablet (4 mg total) by mouth every 8 (eight) hours as needed for nausea or vomiting. 12 tablet 0   pravastatin (PRAVACHOL) 40 MG tablet Take 40 mg by mouth at bedtime.     pregabalin (LYRICA) 50 MG capsule Take 1 capsule (50 mg total) by mouth 3 (three) times daily. 270 capsule 0   sertraline (ZOLOFT) 100 MG tablet Take 100 mg by mouth 3 times/day as needed-between meals & bedtime (for nerves).      sodium chloride (OCEAN) 0.65 % SOLN nasal spray Place 1 spray into both nostrils as needed for congestion. 15 mL 0   blood glucose meter kit and supplies KIT Dispense based on patient and insurance preference. Use up to four times daily as directed. (FOR ICD-9 250.00, 250.01). For QAC - HS accuchecks. 1 each 1   doxycycline (VIBRA-TABS) 100 MG tablet Take 1 tablet (100 mg total) by mouth 2 (two) times daily. 20 tablet 0   glucose blood (FREESTYLE LITE) test strip For glucose testing every before meals at bedtime. Diagnosis E 11.65  Can substitute to any accepted brand 100 each 0    [EXPIRED] sulfamethoxazole-trimethoprim (BACTRIM DS) 800-160 MG tablet Take 1 tablet by mouth 2 (two) times daily for 7 days. 14 tablet 0    Home: Home Living Family/patient expects to be discharged to:: Private residence Living Arrangements: Alone Available Help at Discharge: Available PRN/intermittently, Family Type of Home: House Home Access: Stairs to enter Technical brewer of Steps: 3 Entrance Stairs-Rails: Right, Left Home Layout: One level Bathroom Shower/Tub: Multimedia programmer: Standard Home Equipment: Electronics engineer Comments: pt on 3lnc at baseline  Functional History: Prior Function Level of Independence: Independent Comments: ADLs, IADLs, mobility independently  Functional Status:  Mobility: Bed Mobility Overal bed mobility: Needs Assistance Bed Mobility: Sit to Supine Supine to sit: Min assist Sit to supine: Max assist, +2 for physical assistance General bed mobility comments: assistance needed for bilateral LE management back onto bed and for trunk management Transfers Overall transfer level: Needs assistance Equipment used: 2  person hand held assist Transfer via Lift Equipment: Stedy Transfers: Sit to/from Stand Sit to Stand: Mod assist, +2 physical assistance, +2 safety/equipment General transfer comment: cueing for technique, assistance needed to power into standing from EOB x1 and from STEDY pads x5 to work on bilateral LE strengthening      ADL: ADL Overall ADL's : Needs assistance/impaired Eating/Feeding: Minimal assistance, Sitting Eating/Feeding Details (indicate cue type and reason): tremor impacting hand to mouth Grooming: Minimal assistance, Sitting Grooming Details (indicate cue type and reason): required assistance to stabilize UE due to tremor Upper Body Bathing: Minimal assistance, Sitting Lower Body Bathing: Moderate assistance, Sit to/from stand, +2 for physical assistance, +2 for safety/equipment Upper Body  Dressing : Minimal assistance, Sitting Lower Body Dressing: Moderate assistance, Sit to/from stand, +2 for physical assistance, +2 for safety/equipment Lower Body Dressing Details (indicate cue type and reason): required assistance to don socks sitting EOB Toilet Transfer Details (indicate cue type and reason): deferred Toileting- Clothing Manipulation and Hygiene: Total assistance Functional mobility during ADLs: Moderate assistance, +2 for physical assistance, +2 for safety/equipment General ADL Comments: pt tolerated sitting EOB to complete grooming with minguard-minA, she required modA+2 to stand from EOB limited standing tolerance, about 10 seconds;limited functional use of BUE due to weakness and tremors  Cognition: Cognition Overall Cognitive Status: Impaired/Different from baseline Orientation Level: (P) Oriented to person, Oriented to place, Oriented to time, Oriented to situation Cognition Arousal/Alertness: Awake/alert Behavior During Therapy: Flat affect Overall Cognitive Status: Impaired/Different from baseline Area of Impairment: Orientation, Attention, Memory, Following commands, Safety/judgement, Awareness, Problem solving Orientation Level: Disoriented to, Situation, Time Current Attention Level: Focused Memory: Decreased short-term memory Following Commands: Follows one step commands inconsistently, Follows one step commands with increased time Safety/Judgement: Decreased awareness of safety, Decreased awareness of deficits Awareness: Intellectual Problem Solving: Slow processing, Decreased initiation, Difficulty sequencing, Requires verbal cues, Requires tactile cues General Comments: Pt stating year is 2002 despite cues, pt with flat affect throughout session, tangential thoughts and easily distractable during session (sister arrive and tele visit started increasing distractibility) pt following simple one step cues with increased time and inconsistently  Blood pressure  (!) 154/74, pulse 94, temperature 98.4 F (36.9 C), temperature source Oral, resp. rate 16, height _0  (1.6 m), weight 120.6 kg, SpO2 98 %. Physical Exam  General: Alert. No apparent distress HEENT: Head is normocephalic, atraumatic, PERRLA, EOMI, sclera anicteric, oral mucosa pink and moist, dentition intact, ext ear canals clear,  Neck: Supple without JVD or lymphadenopathy Heart: Reg rate and rhythm. No murmurs rubs or gallops Chest: CTA bilaterally without wheezes, rales, or rhonchi; no distress Abdomen: Soft, non-tender, non-distended, bowel sounds positive. Extremities: Right lower extremity mild edema, erythema, and discoloration.  Skin: See above Neuro: + tremor, alert but disoriented and unable to answer questions accurately regarding her baseline function, can follow simple commands, 4-5 strength throughout, 3/5 strength in RLE.  Psych: Pt's affect is appropriate. Pt is cooperative  Results for orders placed or performed during the hospital encounter of 11/08/19 (from the past 24 hour(s))  Glucose, capillary     Status: Abnormal   Collection Time: 11/19/19  4:43 PM  Result Value Ref Range   Glucose-Capillary 245 (H) 70 - 99 mg/dL  Glucose, capillary     Status: Abnormal   Collection Time: 11/19/19  9:33 PM  Result Value Ref Range   Glucose-Capillary 260 (H) 70 - 99 mg/dL  Glucose, capillary     Status: Abnormal   Collection Time: 11/20/19 12:33 AM  Result Value Ref Range   Glucose-Capillary 139 (H) 70 - 99 mg/dL  CBC     Status: Abnormal   Collection Time: 11/20/19  4:04 AM  Result Value Ref Range   WBC 3.7 (L) 4.0 - 10.5 K/uL   RBC 3.32 (L) 3.87 - 5.11 MIL/uL   Hemoglobin 10.5 (L) 12.0 - 15.0 g/dL   HCT 34.0 (L) 36 - 46 %   MCV 102.4 (H) 80.0 - 100.0 fL   MCH 31.6 26.0 - 34.0 pg   MCHC 30.9 30.0 - 36.0 g/dL   RDW 17.8 (H) 11.5 - 15.5 %   Platelets 190 150 - 400 K/uL   nRBC 0.0 0.0 - 0.2 %  Comprehensive metabolic panel     Status: Abnormal   Collection Time:  11/20/19  4:04 AM  Result Value Ref Range   Sodium 150 (H) 135 - 145 mmol/L   Potassium 2.7 (LL) 3.5 - 5.1 mmol/L   Chloride 111 98 - 111 mmol/L   CO2 29 22 - 32 mmol/L   Glucose, Bld 134 (H) 70 - 99 mg/dL   BUN 23 8 - 23 mg/dL   Creatinine, Ser 0.93 0.44 - 1.00 mg/dL   Calcium 10.2 8.9 - 10.3 mg/dL   Total Protein 6.1 (L) 6.5 - 8.1 g/dL   Albumin 3.0 (L) 3.5 - 5.0 g/dL   AST 14 (L) 15 - 41 U/L   ALT 14 0 - 44 U/L   Alkaline Phosphatase 36 (L) 38 - 126 U/L   Total Bilirubin 0.9 0.3 - 1.2 mg/dL   GFR calc non Af Amer >60 >60 mL/min   GFR calc Af Amer >60 >60 mL/min   Anion gap 10 5 - 15  Glucose, capillary     Status: Abnormal   Collection Time: 11/20/19  4:52 AM  Result Value Ref Range   Glucose-Capillary 119 (H) 70 - 99 mg/dL  Glucose, capillary     Status: Abnormal   Collection Time: 11/20/19  8:08 AM  Result Value Ref Range   Glucose-Capillary 129 (H) 70 - 99 mg/dL  Magnesium     Status: Abnormal   Collection Time: 11/20/19  8:37 AM  Result Value Ref Range   Magnesium 1.4 (L) 1.7 - 2.4 mg/dL  Glucose, capillary     Status: Abnormal   Collection Time: 11/20/19 11:44 AM  Result Value Ref Range   Glucose-Capillary 132 (H) 70 - 99 mg/dL  Basic metabolic panel     Status: Abnormal   Collection Time: 11/20/19  1:50 PM  Result Value Ref Range   Sodium 155 (H) 135 - 145 mmol/L   Potassium 2.8 (L) 3.5 - 5.1 mmol/L   Chloride 115 (H) 98 - 111 mmol/L   CO2 25 22 - 32 mmol/L   Glucose, Bld 130 (H) 70 - 99 mg/dL   BUN 24 (H) 8 - 23 mg/dL   Creatinine, Ser 1.10 (H) 0.44 - 1.00 mg/dL   Calcium 10.3 8.9 - 10.3 mg/dL   GFR calc non Af Amer 51 (L) >60 mL/min   GFR calc Af Amer 59 (L) >60 mL/min   Anion gap 15 5 - 15   US Abdomen Limited RUQ  Result Date: 11/18/2019 CLINICAL DATA:  Cholecystitis EXAM: ULTRASOUND ABDOMEN LIMITED RIGHT UPPER QUADRANT COMPARISON:  CT dated November 08, 2013 FINDINGS: Gallbladder: The gallbladder is filled with stones. The gallbladder wall is mildly  thickened at 4 mm. There is no pericholecystic free fluid. The sonographic Murphy sign cannot be adequately  assessed on this study. The gallbladder wall again appears to be calcified. Common bile duct: Diameter: Cannot be visualized Liver: The echogenicity is increased. There is no discrete hepatic mass. Portal vein is patent on color Doppler imaging with normal direction of blood flow towards the liver. Other: None IMPRESSION: 1. Cholelithiasis with mild gallbladder wall thickening. The sonographic Percell Miller sign cannot be adequately assessed secondary to patient condition. If there is high clinical suspicion for acute cholecystitis, follow-up with HIDA scan is recommended. 2. Probable hepatic steatosis. Electronically Signed   By: Constance Holster M.D.   On: 11/18/2019 18:21     Assessment/Plan: Diagnosis: Cellulitis of right lower extremity 1. Does the need for close, 24 hr/day medical supervision in concert with the patient's rehab needs make it unreasonable for this patient to be served in a less intensive setting? Yes 2. Co-Morbidities requiring supervision/potential complications: type 2 diabetes mellitus on insulin, HTN, chronic diastolic CHF, chronic respiratory failure with hypoxia, acute respiratory failure, sepsis 3. Due to bladder management, bowel management, safety, skin/wound care, disease management, medication administration, pain management and patient education, does the patient require 24 hr/day rehab nursing? Yes 4. Does the patient require coordinated care of a physician, rehab nurse, therapy disciplines of PT, OT, SLP to address physical and functional deficits in the context of the above medical diagnosis(es)? Yes Addressing deficits in the following areas: balance, endurance, locomotion, strength, transferring, bowel/bladder control, bathing, dressing, feeding, grooming, toileting, cognition and psychosocial support 5. Can the patient actively participate in an intensive therapy  program of at least 3 hrs of therapy per day at least 5 days per week? Yes 6. The potential for patient to make measurable gains while on inpatient rehab is excellent 7. Anticipated functional outcomes upon discharge from inpatient rehab are min assist  with PT, min assist with OT, min assist with SLP. 8. Estimated rehab length of stay to reach the above functional goals is: 16-18 days 9. Anticipated discharge destination: Home 10. Overall Rehab/Functional Prognosis: excellent  RECOMMENDATIONS: This patient's condition is appropriate for continued rehabilitative care in the following setting: CIR Patient has agreed to participate in recommended program. Yes Note that insurance prior authorization may be required for reimbursement for recommended care.  Comment: Mrs. Sylvia would be an excellent CIR candidate. 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 Given high level of baseline functioning, strong work ethic, significant mobility and cognitive deficits, and strong social support, I feel she would benefit from CIR more than SNF at daughter and patient are in agreement.   Izora Ribas, MD 11/20/2019

## 2019-11-20 NOTE — Progress Notes (Signed)
PROGRESS NOTE    Kathy Irwin  HKC:[id number] DOB: 1951-03-15 DOA: 11/08/2019 PCP: Kerin Perna, NP  Brief Narrative: 69 year old female with obesity, chronic diastolic failure, ITP, chronic hypoxemic respiratory failure on 3 L nasal cannula at baseline secondary to interstitial lung disease followed by Dr. Vaughan Browner in the ILD clinic at pulmonary clinic.  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  History of COVID-19 in September 2020 ->admitted 7/25 w/ RLE cellulitis in spite of 10d Cephalexin also cc: N/V.  The following day developed encephalopathy and hypercarbia, was transferred to the ICU and intubated, diuresed aggressively in the ICU subsequently developed acute renal failure -Eventually extubated on 8/1 -Kidney function continued to worsen from normal range up to 3, started on IV fluids on 8/3 -Transferred from PCCM to [facility] 8/4   Assessment & Plan:   Acute hypercarbic respiratory failure Chronic hypoxic respiratory failure/ILD Suspected OHS/OSA Acute on chronic diastolic CHF Sepsis -Status post ventilator dependent respiratory failure, intubated 7/26 and extubated 8/1 -Diuresed aggressively in the ICU -Diuretics held due to progressive renal failure -Slowly improving, wean O2 as tolerated -Caution with oversedation -Will need pulmonary follow-up and sleep study -Ambulate, out of bed to chair   Sepsis, right lower extremity cellulitis -Failed outpatient 10-day course of Keflex -CT noted cellulitis, without evidence of myositis or drainable fluid collection -Treated with 4 days of IV vancomycin till 7/28 and 8 days of IV Zosyn till 8/1 -Completed antibiotic course, clinically improving -Cultures negative  Acute kidney injury -From aggressive diuresis, and mild component of hydronephrosis -Baseline creatinine of 0.9, creatinine trended up to 3.1 with overdiuresis and sepsis -Now improving, discontinue fluids this afternoon -Diuretics on  hold -Foley catheter placed in the ICU 8/3 due to hydronephrosis in the setting of AKI -Kidney function has normalized, DC Foley catheter and monitor  Metabolic encephalopathy -In the setting of sepsis and hypercarbia -Improving, mild tremors noted which could be from elevated BUN and Lyrica withdrawal -Resume Lyrica to today at low-dose  History of ITP -Platelet count has normalized  Type 2 diabetes mellitus Obesity -CBGS stable, needs to lose weight  Gallstones -Noted on ultrasound done in ICU for nausea, denies any pain, no right upper quadrant tenderness -LFTS unremarkable, Monitor clinically  Chronic respiratory failure Interstitial lung disease -On 3 L home O2 at baseline, continue same   DVT prophylaxis: Lovenox Code Status: Full code Family Communication: Discussed with patient in detail, sister at bedside Disposition Plan:  Status is: Inpatient  Remains inpatient appropriate because:Inpatient level of care appropriate due to severity of illness   Dispo: The patient is from: Home              Anticipated d/c is to: CIR              Anticipated d/c date is: 2 to 3 days              Patient currently is not medically stable to d/c.  Consultants:   PCCM Transfer   Procedures:   Antimicrobials:    Subjective: -Feels a little better, very uncomfortable with the Foley catheter  Objective: Vitals:   11/19/19 1647 11/19/19 2122 11/20/19 0455 11/20/19 0830  BP: (!) 132/95 124/76 (!) 143/78 (!) 154/74  Pulse: (!) 108 (!) 103 88 94  Resp: _0 Temp: 98 F (36.7 C) 98.4 F (36.9 C) 98 F (36.7 C) 98.4 F (36.9 C)  TempSrc: Oral Oral  Oral  SpO2: 100%  96% 95% 98%  Weight:      Height:        Intake/Output Summary (Last 24 hours) at 11/20/2019 1143 Last data filed at 11/20/2019 1019 Gross per 24 hour  Intake 2387 ml  Output 4850 ml  Net -2463 ml   Filed Weights   11/17/19 0259 11/18/19 0233 11/19/19 0500  Weight: (!) 124.1 kg 120.6 kg 120.6  kg    Examination:  General exam: Obese pleasant female sitting up in bed, awake alert oriented to self, place and partly to time HEENT: No JVD CVS: S1-S2, regular rate rhythm Lungs: Decreased breath sounds the bases otherwise clear Abdomen: Soft, nontender, nondistended, bowel sounds present Extremities: Right leg with improvement in erythema, mild discoloration Skin: As above Psychiatry:  Mood & affect appropriate.     Data Reviewed:   CBC: Recent Labs  Lab 11/15/19 0114 11/17/19 0600 11/18/19 0227 11/19/19 0627 11/20/19 0404  WBC 6.5 7.5 8.6 5.5 3.7*  HGB 11.2* 11.8* 11.0* 10.6* 10.5*  HCT 34.5* 38.3 34.1* 34.8* 34.0*  MCV 98.9 100.3* 98.6 100.3* 102.4*  PLT 59* 99* 152 183 379   Basic Metabolic Panel: Recent Labs  Lab 11/17/19 0307 11/17/19 1602 11/18/19 0227 11/19/19 0627 11/20/19 0404 11/20/19 0837  NA 142 141 139 153* 150*  --   K 4.4 3.9 3.5 3.6 2.7*  --   CL 102 96* 96* 109 111  --   CO2 _0 32 29  --   GLUCOSE 138* 164* 153* 107* 134*  --   BUN 79* 84* 91* 65* 23  --   CREATININE 2.42* 2.74* 3.16* 2.24* 0.93  --   CALCIUM 10.1 10.2 9.8 10.5* 10.2  --   MG 2.1  --  2.2  --   --  1.4*  PHOS 7.5*  --  8.4*  --   --   --    GFR: Estimated Creatinine Clearance: 71.8 mL/min (by C-G formula based on SCr of 0.93 mg/dL). Liver Function Tests: Recent Labs  Lab 11/14/19 0143 11/15/19 0114 11/18/19 1523 11/20/19 0404  AST _1 14*  ALT _2 ALKPHOS 41 46 41 36*  BILITOT 1.2 0.9 1.2 0.9  PROT 6.2* 6.3* 6.7 6.1*  ALBUMIN 3.0* 2.9* 3.4* 3.0*   No results for input(s): LIPASE, AMYLASE in the last 168 hours. No results for input(s): AMMONIA in the last 168 hours. Coagulation Profile: No results for input(s): INR, PROTIME in the last 168 hours. Cardiac Enzymes: No results for input(s): CKTOTAL, CKMB, CKMBINDEX, TROPONINI in the last 168 hours. BNP (last 3 results) Recent Labs    03/03/19 1151  PROBNP 70.0   HbA1C: No results  for input(s): HGBA1C in the last 72 hours. CBG: Recent Labs  Lab 11/19/19 1643 11/19/19 2133 11/20/19 0033 11/20/19 0452 11/20/19 0808  GLUCAP 245* 260* 139* 119* 129*   Lipid Profile: No results for input(s): CHOL, HDL, LDLCALC, TRIG, CHOLHDL, LDLDIRECT in the last 72 hours. Thyroid Function Tests: No results for input(s): TSH, T4TOTAL, FREET4, T3FREE, THYROIDAB in the last 72 hours. Anemia Panel: No results for input(s): VITAMINB12, FOLATE, FERRITIN, TIBC, IRON, RETICCTPCT in the last 72 hours. Urine analysis:    Component Value Date/Time   COLORURINE YELLOW 11/09/2019 1236   APPEARANCEUR CLOUDY (A) 11/09/2019 1236   LABSPEC 1.021 11/09/2019 1236   PHURINE 7.0 11/09/2019 1236   GLUCOSEU NEGATIVE 11/09/2019 1236   HGBUR NEGATIVE 11/09/2019 [place] 11/09/2019 1236   KETONESUR  NEGATIVE 11/09/2019 [place] 11/09/2019 1236   UROBILINOGEN 1.0 08/14/2019 1824   NITRITE NEGATIVE 11/09/2019 1236   LEUKOCYTESUR NEGATIVE 11/09/2019 1236   Sepsis Labs: _0 (procalcitonin:4,lacticidven:4)  ) Recent Results (from the past 240 hour(s))  Culture, respiratory (non-expectorated)     Status: None   Collection Time: 11/11/19 11:59 AM   Specimen: Tracheal Aspirate; Respiratory  Result Value Ref Range Status   Specimen Description TRACHEAL ASPIRATE  Final   Special Requests NONE  Final   Gram Stain RARE WBC SEEN NO ORGANISMS SEEN   Final   Culture   Final    RARE Consistent with normal respiratory flora. Performed at [facility], [place] [address] [place], [place] [address]    Report Status 11/13/2019 FINAL  Final         Radiology Studies: US Abdomen Limited RUQ  Result Date: 11/18/2019 CLINICAL DATA:  Cholecystitis EXAM: ULTRASOUND ABDOMEN LIMITED RIGHT UPPER QUADRANT COMPARISON:  CT dated November 08, 2013 FINDINGS: Gallbladder: The gallbladder is filled with stones. The gallbladder wall is mildly thickened at 4 mm. There is no  pericholecystic free fluid. The sonographic Percell Miller sign cannot be adequately assessed on this study. The gallbladder wall again appears to be calcified. Common bile duct: Diameter: Cannot be visualized Liver: The echogenicity is increased. There is no discrete hepatic mass. Portal vein is patent on color Doppler imaging with normal direction of blood flow towards the liver. Other: None IMPRESSION: 1. Cholelithiasis with mild gallbladder wall thickening. The sonographic Percell Miller sign cannot be adequately assessed secondary to patient condition. If there is high clinical suspicion for acute cholecystitis, follow-up with HIDA scan is recommended. 2. Probable hepatic steatosis. Electronically Signed   By: Constance Holster M.D.   On: 11/18/2019 18:21        Scheduled Meds: . chlorhexidine  15 mL Mouth Rinse BID  . Chlorhexidine Gluconate Cloth  6 each Topical Daily  . enoxaparin (LOVENOX) injection  30 mg Subcutaneous Q24H  . famotidine  40 mg Oral Daily  . insulin aspart  0-9 Units Subcutaneous Q4H  . mouth rinse  15 mL Mouth Rinse q12n4p  . multivitamin with minerals  1 tablet Oral Daily  . sertraline  100 mg Oral QHS  . sodium chloride flush  3 mL Intravenous Once   Continuous Infusions: . dextrose 5 % and 0.45 % NaCl with KCl 40 mEq/L 75 mL/hr at 11/20/19 0840     LOS: 11 days    Time spent: 59mn   PDomenic Polite MD [facility]  11/20/2019, 11:43 AM

## 2019-11-20 NOTE — Plan of Care (Signed)
  Problem: Pain Managment: Goal: General experience of comfort will improve Outcome: Progressing

## 2019-11-20 NOTE — Progress Notes (Signed)
Inpatient Rehab Admissions Coordinator Note:   Per PT recommendations, pt was screened for CIR candidacy by Shann Medal, PT, DPT.  At this time we are recommending a CIR consult and I will place an order per our protocol.  Note that OT recommending SNF, and insurance not likely to approve CIR with SNF recommendations.  Please contact me with questions.   Shann Medal, PT, DPT [phone] 11/20/19 12:03 PM

## 2019-11-20 NOTE — Progress Notes (Signed)
Critical Lab Potassium-2.7 On call provider. B. Chotiner, made aware.

## 2019-11-20 NOTE — Progress Notes (Signed)
Pt with BMI on 47 and on lovenox 92m SQ qday. AKI has resolved. Ok to increase lovenox to 0.566mkg/day per Dr. JoBroadus JohnGot dose of 3014mhis AM so will give another 66m63m now.  Lovenox 60mg22mq24 start in AM  Minh Onnie BoerrmD, BCIDPMargaretIVP, CPP Infectious Disease Pharmacist 11/20/2019 2:30 PM

## 2019-11-21 ENCOUNTER — Encounter (HOSPITAL_COMMUNITY): Payer: Self-pay | Admitting: Internal Medicine

## 2019-11-21 LAB — COMPREHENSIVE METABOLIC PANEL
ALT: 14 U/L (ref 0–44)
AST: 16 U/L (ref 15–41)
Albumin: 3.3 g/dL — ABNORMAL LOW (ref 3.5–5.0)
Alkaline Phosphatase: 43 U/L (ref 38–126)
Anion gap: 9 (ref 5–15)
BUN: 12 mg/dL (ref 8–23)
CO2: 31 mmol/L (ref 22–32)
Calcium: 10.2 mg/dL (ref 8.9–10.3)
Chloride: 109 mmol/L (ref 98–111)
Creatinine, Ser: 0.85 mg/dL (ref 0.44–1.00)
GFR calc Af Amer: >60 mL/min (ref >60)
GFR calc non Af Amer: >60 mL/min (ref >60)
Glucose, Bld: 179 mg/dL — ABNORMAL HIGH (ref 70–99)
Potassium: 4 mmol/L (ref 3.5–5.1)
Sodium: 149 mmol/L — ABNORMAL HIGH (ref 135–145)
Total Bilirubin: 0.9 mg/dL (ref 0.3–1.2)
Total Protein: 6.7 g/dL (ref 6.5–8.1)

## 2019-11-21 LAB — BASIC METABOLIC PANEL
Anion gap: 9 (ref 5–15)
BUN: 9 mg/dL (ref 8–23)
CO2: 27 mmol/L (ref 22–32)
Calcium: 10.3 mg/dL (ref 8.9–10.3)
Chloride: 111 mmol/L (ref 98–111)
Creatinine, Ser: 0.84 mg/dL (ref 0.44–1.00)
GFR calc Af Amer: >60 mL/min (ref >60)
GFR calc non Af Amer: >60 mL/min (ref >60)
Glucose, Bld: 165 mg/dL — ABNORMAL HIGH (ref 70–99)
Potassium: 3.7 mmol/L (ref 3.5–5.1)
Sodium: 147 mmol/L — ABNORMAL HIGH (ref 135–145)

## 2019-11-21 LAB — GLUCOSE, CAPILLARY
Glucose-Capillary: 124 mg/dL — ABNORMAL HIGH (ref 70–99)
Glucose-Capillary: 125 mg/dL — ABNORMAL HIGH (ref 70–99)
Glucose-Capillary: 135 mg/dL — ABNORMAL HIGH (ref 70–99)
Glucose-Capillary: 142 mg/dL — ABNORMAL HIGH (ref 70–99)
Glucose-Capillary: 177 mg/dL — ABNORMAL HIGH (ref 70–99)
Glucose-Capillary: 212 mg/dL — ABNORMAL HIGH (ref 70–99)

## 2019-11-21 LAB — CBC
HCT: 36.6 % (ref 36.0–46.0)
Hemoglobin: 11 g/dL — ABNORMAL LOW (ref 12.0–15.0)
MCH: 31.3 pg (ref 26.0–34.0)
MCHC: 30.1 g/dL (ref 30.0–36.0)
MCV: 104 fL — ABNORMAL HIGH (ref 80.0–100.0)
Platelets: 181 10*3/uL (ref 150–400)
RBC: 3.52 MIL/uL — ABNORMAL LOW (ref 3.87–5.11)
RDW: 17.8 % — ABNORMAL HIGH (ref 11.5–15.5)
WBC: 6.5 10*3/uL (ref 4.0–10.5)
nRBC: 0 % (ref 0.0–0.2)

## 2019-11-21 MED ORDER — TAMSULOSIN HCL 0.4 MG PO CAPS
0.4000 mg | ORAL_CAPSULE | Freq: Every day | ORAL | Status: DC
  Administered 2019-11-21 – 2019-12-01 (×10): 0.4 mg via ORAL
  Filled 2019-11-21 (×10): qty 1

## 2019-11-21 MED ORDER — METOPROLOL SUCCINATE ER 50 MG PO TB24
50.0000 mg | ORAL_TABLET | Freq: Every day | ORAL | Status: DC
  Administered 2019-11-21: 50 mg via ORAL
  Filled 2019-11-21: qty 1

## 2019-11-21 MED ORDER — PRAVASTATIN SODIUM 10 MG PO TABS
20.0000 mg | ORAL_TABLET | Freq: Every day | ORAL | Status: DC
  Administered 2019-11-21 – 2019-12-01 (×11): 20 mg via ORAL
  Filled 2019-11-21 (×10): qty 2

## 2019-11-21 NOTE — Plan of Care (Signed)
  Problem: Clinical Measurements: Goal: Diagnostic test results will improve Outcome: Completed/Met Goal: Respiratory complications will improve Outcome: Completed/Met Goal: Cardiovascular complication will be avoided Outcome: Completed/Met   Problem: Nutrition: Goal: Adequate nutrition will be maintained Outcome: Completed/Met   Problem: Coping: Goal: Level of anxiety will decrease Outcome: Completed/Met   Problem: Pain Managment: Goal: General experience of comfort will improve Outcome: Completed/Met   Problem: Clinical Measurements: Goal: Ability to avoid or minimize complications of infection will improve Outcome: Completed/Met   Problem: Skin Integrity: Goal: Skin integrity will improve Outcome: Completed/Met

## 2019-11-21 NOTE — Progress Notes (Signed)
Physical Therapy Treatment Patient Details Name: Kathy Irwin MRN: [id number] DOB: [birth year] Today's Date: 11/21/2019    History of Present Illness Pt is a 69 year old female presents 7/25 with dx of RLE cellulitis. 7/26 pt developed worsening delirium and desaturated requiring intubation 7/27, pt extubated 8/1. PMH obesity, chronic diastolic failure, ITP, chronic hypoxemic respiratory failure on 3 L nasal cannula at baseline secondary to interstitial lung disease. 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  History of COVID-19 in September 2020.    PT Comments    Pt making good progress overall with functional mobility as indicated by her decreased need for physical assistance and was able to initiate gait training this session. She remains an excellent candidate for further intensive therapy services at CIR to maximize her independence with functional mobility prior to returning home. Pt very eager to make progress and regain her independence.     Follow Up Recommendations  CIR     Equipment Recommendations  None recommended by PT    Recommendations for Other Services       Precautions / Restrictions Precautions Precautions: Fall Precaution Comments: rectal tube, foley Restrictions Weight Bearing Restrictions: No    Mobility  Bed Mobility Overal bed mobility: Needs Assistance Bed Mobility: Supine to Sit;Sit to Supine     Supine to sit: Mod assist Sit to supine: Mod assist   General bed mobility comments: increased time and effort needed, use of bed rails and HOB elevated, assistance needed for trunk elevation and to return bilateral LEs onto bed  Transfers Overall transfer level: Needs assistance Equipment used: Rolling walker (2 wheeled) Transfers: Sit to/from Stand Sit to Stand: Min assist;+2 safety/equipment         General transfer comment: despite max cueing and explanation of reasoning for safe hand placement and technique, pt still  preferring to pull on RW with bilateral UEs to stand from EOB; min A x2 for stability and balance with transition  Ambulation/Gait Ambulation/Gait assistance: Min assist;+2 safety/equipment Gait Distance (Feet): 5 Feet (5' forwards and 5' backwards, twice) Assistive device: Rolling walker (2 wheeled) Gait Pattern/deviations: Step-through pattern;Decreased step length - right;Decreased step length - left;Decreased stride length;Shuffle Gait velocity: decreased   General Gait Details: pt with slow, unsteady gait with very short, shuffling steps   Stairs             Wheelchair Mobility    Modified Rankin (Stroke Patients Only)       Balance Overall balance assessment: Needs assistance Sitting-balance support: No upper extremity supported;Feet supported Sitting balance-Leahy Scale: Fair     Standing balance support: Bilateral upper extremity supported Standing balance-Leahy Scale: Poor Standing balance comment: heavy reliance on bilateral UEs                            Cognition Arousal/Alertness: Awake/alert Behavior During Therapy: Flat affect Overall Cognitive Status: Impaired/Different from baseline Area of Impairment: Attention;Memory;Following commands;Safety/judgement;Awareness;Problem solving                   Current Attention Level: Selective Memory: Decreased short-term memory Following Commands: Follows one step commands inconsistently;Follows one step commands with increased time Safety/Judgement: Decreased awareness of safety;Decreased awareness of deficits Awareness: Intellectual Problem Solving: Slow processing;Decreased initiation;Difficulty sequencing;Requires verbal cues;Requires tactile cues        Exercises      General Comments        Pertinent Vitals/Pain Pain Assessment: No/denies pain  Home Living                      Prior Function            PT Goals (current goals can now be found in the care  plan section) Acute Rehab PT Goals PT Goal Formulation: With patient Time For Goal Achievement: 12/03/19 Potential to Achieve Goals: Good Progress towards PT goals: Progressing toward goals    Frequency    Min 3X/week      PT Plan Current plan remains appropriate    Co-evaluation              AM-PAC PT "6 Clicks" Mobility   Outcome Measure  Help needed turning from your back to your side while in a flat bed without using bedrails?: A Little Help needed moving from lying on your back to sitting on the side of a flat bed without using bedrails?: A Lot Help needed moving to and from a bed to a chair (including a wheelchair)?: A Little Help needed standing up from a chair using your arms (e.g., wheelchair or bedside chair)?: A Little Help needed to walk in hospital room?: A Little Help needed climbing 3-5 steps with a railing? : A Lot 6 Click Score: 16    End of Session Equipment Utilized During Treatment: Gait belt;Oxygen Activity Tolerance: Patient limited by fatigue Patient left: in bed;with call bell/phone within reach;with bed alarm set Nurse Communication: Mobility status PT Visit Diagnosis: Other abnormalities of gait and mobility (R26.89);Muscle weakness (generalized) (M62.81)     Time: 6837-2902 PT Time Calculation (min) (ACUTE ONLY): 27 min  Charges:  $Gait Training: 8-22 mins $Therapeutic Activity: 8-22 mins                     Anastasio Champion, DPT  Acute Rehabilitation Services Pager [phone] Office [place] 11/21/2019, 12:52 PM

## 2019-11-21 NOTE — Progress Notes (Addendum)
Inpatient Rehab Admissions:  1618: Spoke with daughter, [name].  She works from home during the day, and other family members are able to stay with pt when [name] is working out of the home some evenings.  Will proceed with requesting prior auth for CIR from insurance and will f/u early next week.   Inpatient Rehab Consult received.  Pt sleeping soundly when I entered her room.  I did speak to her daughter, [name], and her sister, Britt Boozer, over the phone for rehabilitation assessment and to discuss goals and expectations of an inpatient rehab admission.  They are unsure whether they could provide expected level of assist (24/7 assist), at discharge from CIR.  Leaning towards SNF for longer term rehab at this time, but would like to discuss amongst other family members as well.  I will have my colleague, Clemens Catholic, f/u with them tomorrow to take their decision.  If they decide they can provide 24/7 assist and wish to pursue CIR, we will need insurance authorization.    Signed: Shann Medal, PT, DPT Admissions Coordinator [phone] 11/21/19  4:14 PM

## 2019-11-21 NOTE — Plan of Care (Signed)
  Problem: Education: Goal: Knowledge of General Education information will improve Description: Including pain rating scale, medication(s)/side effects and non-pharmacologic comfort measures Outcome: Completed/Met

## 2019-11-21 NOTE — Progress Notes (Addendum)
PROGRESS NOTE    Kathy Irwin  WUJ:[id number] DOB: [birth year] DOA: 11/08/2019 PCP: Kerin Perna, NP  Brief Narrative: 69 year old female with obesity, chronic diastolic failure, ITP, chronic hypoxemic respiratory failure on 3 L nasal cannula at baseline secondary to interstitial lung disease followed by Dr. Vaughan Browner in the ILD clinic at pulmonary clinic.  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  History of COVID-19 in September 2020 ->admitted 7/25 w/ RLE cellulitis in spite of 10d Cephalexin also cc: N/V.  The following day developed encephalopathy and hypercarbia, was transferred to the ICU and intubated, diuresed aggressively in the ICU subsequently developed acute renal failure -Eventually extubated on 8/1 -Kidney function continued to worsen from normal range up to 3, started on IV fluids on 8/3 -Transferred from PCCM to [facility] course complicated by recurrent urinary retention requiring Foley catheter placement, continues to improve slowly  Assessment & Plan:   Acute hypercarbic respiratory failure Chronic hypoxic respiratory failure/ILD Suspected OHS/OSA Acute on chronic diastolic CHF Sepsis -Status post ventilator dependent respiratory failure, intubated 7/26 and extubated 8/1 -Diuresed aggressively in the ICU -Diuretics held due to progressive renal failure -Slowly improving, weaned O2 to 2L, on 3L at baseline -Diuretics on hold, auto diuresing at this time -Will need pulmonary follow-up and sleep study -Ambulate, out of bed to chair -DC planning, CIR being considered   Sepsis, right lower extremity cellulitis -Failed outpatient 10-day course of Keflex -CT noted cellulitis, without evidence of myositis or drainable fluid collection -Treated with 4 days of IV vancomycin till 7/28 and 8 days of IV Zosyn till 8/1 -Completed antibiotic course, clinically improving -Cultures negative  Acute kidney injury -From aggressive diuresis, and  mild component of hydronephrosis -Baseline creatinine of 0.9, creatinine trended up to 3.1 with overdiuresis and sepsis -Now improving, discontinue fluids this afternoon -Diuretics on hold -Foley catheter placed in the ICU 8/3 due to hydronephrosis in the setting of AKI -Kidney function has normalized, removed Foley catheter 8/5 AM subsequently had multiple episodes of urinary retention eventually Foley catheter replaced early a.m. on 8/6  Metabolic encephalopathy -In the setting of sepsis and hypercarbia -Improving, mild tremors noted which could be from elevated BUN and Lyrica withdrawal -Resumed Lyrica at low-dose  History of ITP -Platelet count has normalized  Type 2 diabetes mellitus Obesity -CBGS stable, needs to lose weight  Gallstones -Noted on ultrasound done in ICU for nausea, denies any pain, no right upper quadrant tenderness -LFTS unremarkable, Monitor clinically -Follow-up with general surgery for elective cholecystectomy  Chronic respiratory failure Interstitial lung disease -On 3 L home O2 at baseline, continue same   DVT prophylaxis: Lovenox Code Status: Full code Family Communication: Discussed with patient in detail, sister at bedside Disposition Plan:  Status is: Inpatient  Remains inpatient appropriate because:Inpatient level of care appropriate due to severity of illness   Dispo: The patient is from: Home              Anticipated d/c is to: CIR              Anticipated d/c date is: 1-2days              Patient currently is not medically stable to d/c.  Consultants:   PCCM Transfer   Procedures:   Antimicrobials:    Subjective: -Feels okay overall, requiring a bunch of physical assistance, improving slowly  Objective: Vitals:   11/20/19 2055 11/20/19 2311 11/21/19 0409 11/21/19 0946  BP: (!) 160/93  Marland Kitchen)  177/95 (!) 147/86  Pulse: (!) 106  88 95  Resp: _0 Temp: 99.1 F (37.3 C)  98.4 F (36.9 C) 98.5 F (36.9 C)  TempSrc:  Oral  Oral Oral  SpO2: (!) 87% 94% 97% 93%  Weight:   116.4 kg   Height:        Intake/Output Summary (Last 24 hours) at 11/21/2019 1104 Last data filed at 11/21/2019 0947 Gross per 24 hour  Intake 1396.25 ml  Output 3200 ml  Net -1803.75 ml   Filed Weights   11/18/19 0233 11/19/19 0500 11/21/19 0409  Weight: 120.6 kg 120.6 kg 116.4 kg    Examination:  General exam: Obese pleasant female sitting up in bed, awake alert oriented to self place and partly to time HEENT: No JVD CVS: S1-S2, regular rate rhythm Lungs: Decreased breath sounds bases, otherwise clear Abdomen: Soft, nontender, nondistended, bowel sounds present  Extremities: Right leg with improvement in erythema, mild discoloration Skin: As above Psychiatry:  Mood & affect appropriate.     Data Reviewed:   CBC: Recent Labs  Lab 11/17/19 0600 11/18/19 0227 11/19/19 0627 11/20/19 0404 11/21/19 0518  WBC 7.5 8.6 5.5 3.7* 6.5  HGB 11.8* 11.0* 10.6* 10.5* 11.0*  HCT 38.3 34.1* 34.8* 34.0* 36.6  MCV 100.3* 98.6 100.3* 102.4* 104.0*  PLT 99* 152 183 190 199   Basic Metabolic Panel: Recent Labs  Lab 11/17/19 0307 11/17/19 1602 11/18/19 0227 11/19/19 0627 11/20/19 0404 11/20/19 0837 11/20/19 1350 11/21/19 0518  NA 142   < > 139 153* 150*  --  155* 149*  K 4.4   < > 3.5 3.6 2.7*  --  2.8* 4.0  CL 102   < > 96* 109 111  --  115* 109  CO2 27   < > 27 32 29  --  25 31  GLUCOSE 138*   < > 153* 107* 134*  --  130* 179*  BUN 79*   < > 91* 65* 23  --  24* 12  CREATININE 2.42*   < > 3.16* 2.24* 0.93  --  1.10* 0.85  CALCIUM 10.1   < > 9.8 10.5* 10.2  --  10.3 10.2  MG 2.1  --  2.2  --   --  1.4*  --   --   PHOS 7.5*  --  8.4*  --   --   --   --   --    < > = values in this interval not displayed.   GFR: Estimated Creatinine Clearance: 76.9 mL/min (by C-G formula based on SCr of 0.85 mg/dL). Liver Function Tests: Recent Labs  Lab 11/15/19 0114 11/18/19 1523 11/20/19 0404 11/21/19 0518  AST 20 22 14* 16    ALT _1 ALKPHOS 46 41 36* 43  BILITOT 0.9 1.2 0.9 0.9  PROT 6.3* 6.7 6.1* 6.7  ALBUMIN 2.9* 3.4* 3.0* 3.3*   No results for input(s): LIPASE, AMYLASE in the last 168 hours. No results for input(s): AMMONIA in the last 168 hours. Coagulation Profile: No results for input(s): INR, PROTIME in the last 168 hours. Cardiac Enzymes: No results for input(s): CKTOTAL, CKMB, CKMBINDEX, TROPONINI in the last 168 hours. BNP (last 3 results) Recent Labs    03/03/19 1151  PROBNP 70.0   HbA1C: No results for input(s): HGBA1C in the last 72 hours. CBG: Recent Labs  Lab 11/20/19 1643 11/20/19 2055 11/21/19 0003 11/21/19 0408 11/21/19 0804  GLUCAP 163* 212* 212*  135* 125*   Lipid Profile: No results for input(s): CHOL, HDL, LDLCALC, TRIG, CHOLHDL, LDLDIRECT in the last 72 hours. Thyroid Function Tests: No results for input(s): TSH, T4TOTAL, FREET4, T3FREE, THYROIDAB in the last 72 hours. Anemia Panel: No results for input(s): VITAMINB12, FOLATE, FERRITIN, TIBC, IRON, RETICCTPCT in the last 72 hours. Urine analysis:    Component Value Date/Time   COLORURINE YELLOW 11/09/2019 1236   APPEARANCEUR CLOUDY (A) 11/09/2019 1236   LABSPEC 1.021 11/09/2019 1236   PHURINE 7.0 11/09/2019 1236   GLUCOSEU NEGATIVE 11/09/2019 1236   HGBUR NEGATIVE 11/09/2019 1236   BILIRUBINUR NEGATIVE 11/09/2019 1236   KETONESUR NEGATIVE 11/09/2019 1236   PROTEINUR NEGATIVE 11/09/2019 1236   UROBILINOGEN 1.0 08/14/2019 1824   NITRITE NEGATIVE 11/09/2019 1236   LEUKOCYTESUR NEGATIVE 11/09/2019 1236   Sepsis Labs: _0 (procalcitonin:4,lacticidven:4)  ) Recent Results (from the past 240 hour(s))  Culture, respiratory (non-expectorated)     Status: None   Collection Time: 11/11/19 11:59 AM   Specimen: Tracheal Aspirate; Respiratory  Result Value Ref Range Status   Specimen Description TRACHEAL ASPIRATE  Final   Special Requests NONE  Final   Gram Stain RARE WBC SEEN NO ORGANISMS SEEN    Final   Culture   Final    RARE Consistent with normal respiratory flora. Performed at [facility], [address] [place], [place] [address]    Report Status 11/13/2019 FINAL  Final         Radiology Studies: No results found.      Scheduled Meds: . chlorhexidine  15 mL Mouth Rinse BID  . Chlorhexidine Gluconate Cloth  6 each Topical Daily  . enoxaparin (LOVENOX) injection  60 mg Subcutaneous Q24H  . famotidine  40 mg Oral Daily  . insulin aspart  0-9 Units Subcutaneous Q4H  . mouth rinse  15 mL Mouth Rinse q12n4p  . metoprolol succinate  50 mg Oral Daily  . multivitamin with minerals  1 tablet Oral Daily  . pravastatin  20 mg Oral q1800  . pregabalin  50 mg Oral QPM  . sertraline  100 mg Oral QHS  . sodium chloride flush  3 mL Intravenous Once  . tamsulosin  0.4 mg Oral Daily   Continuous Infusions:    LOS: 12 days    Time spent: 79mn   PDomenic Polite MD [facility]  11/21/2019, 11:04 AM

## 2019-11-22 LAB — COMPREHENSIVE METABOLIC PANEL
ALT: 13 U/L (ref 0–44)
AST: 15 U/L (ref 15–41)
Albumin: 3.2 g/dL — ABNORMAL LOW (ref 3.5–5.0)
Alkaline Phosphatase: 41 U/L (ref 38–126)
Anion gap: 7 (ref 5–15)
BUN: 8 mg/dL (ref 8–23)
CO2: 26 mmol/L (ref 22–32)
Calcium: 9.8 mg/dL (ref 8.9–10.3)
Chloride: 111 mmol/L (ref 98–111)
Creatinine, Ser: 0.74 mg/dL (ref 0.44–1.00)
GFR calc Af Amer: >60 mL/min (ref >60)
GFR calc non Af Amer: >60 mL/min (ref >60)
Glucose, Bld: 127 mg/dL — ABNORMAL HIGH (ref 70–99)
Potassium: 3.4 mmol/L — ABNORMAL LOW (ref 3.5–5.1)
Sodium: 144 mmol/L (ref 135–145)
Total Bilirubin: 0.8 mg/dL (ref 0.3–1.2)
Total Protein: 6.2 g/dL — ABNORMAL LOW (ref 6.5–8.1)

## 2019-11-22 LAB — GLUCOSE, CAPILLARY
Glucose-Capillary: 159 mg/dL — ABNORMAL HIGH (ref 70–99)
Glucose-Capillary: 108 mg/dL — ABNORMAL HIGH (ref 70–99)
Glucose-Capillary: 115 mg/dL — ABNORMAL HIGH (ref 70–99)
Glucose-Capillary: 163 mg/dL — ABNORMAL HIGH (ref 70–99)
Glucose-Capillary: 136 mg/dL — ABNORMAL HIGH (ref 70–99)
Glucose-Capillary: 135 mg/dL — ABNORMAL HIGH (ref 70–99)

## 2019-11-22 LAB — CBC
HCT: 34.9 % — ABNORMAL LOW (ref 36.0–46.0)
Hemoglobin: 10.8 g/dL — ABNORMAL LOW (ref 12.0–15.0)
MCH: 31.8 pg (ref 26.0–34.0)
MCHC: 30.9 g/dL (ref 30.0–36.0)
MCV: 102.6 fL — ABNORMAL HIGH (ref 80.0–100.0)
Platelets: 164 10*3/uL (ref 150–400)
RBC: 3.4 MIL/uL — ABNORMAL LOW (ref 3.87–5.11)
RDW: 17.5 % — ABNORMAL HIGH (ref 11.5–15.5)
WBC: 5.6 10*3/uL (ref 4.0–10.5)
nRBC: 0 % (ref 0.0–0.2)

## 2019-11-22 MED ORDER — CLOTRIMAZOLE 1 % VA CREA
1.0000 | TOPICAL_CREAM | Freq: Once | VAGINAL | Status: AC
  Administered 2019-11-23: 1 via VAGINAL
  Filled 2019-11-22: qty 45

## 2019-11-22 MED ORDER — POTASSIUM CHLORIDE CRYS ER 20 MEQ PO TBCR
40.0000 meq | EXTENDED_RELEASE_TABLET | Freq: Once | ORAL | Status: AC
  Administered 2019-11-22: 40 meq via ORAL
  Filled 2019-11-22: qty 2

## 2019-11-22 MED ORDER — METOPROLOL SUCCINATE ER 50 MG PO TB24
75.0000 mg | ORAL_TABLET | Freq: Every day | ORAL | Status: DC
  Administered 2019-11-22 – 2019-12-01 (×9): 75 mg via ORAL
  Filled 2019-11-22 (×10): qty 1

## 2019-11-22 NOTE — Progress Notes (Signed)
PROGRESS NOTE    Kathy Irwin  DGL:[id number] DOB: [birth year] DOA: 11/08/2019 PCP: Kerin Perna, NP  Brief Narrative: 69 year old female with obesity, chronic diastolic failure, ITP, chronic hypoxemic respiratory failure on 3 L nasal cannula at baseline secondary to interstitial lung disease followed by Dr. Vaughan Browner in the ILD clinic at pulmonary clinic.  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  History of COVID-19 in September 2020 ->admitted 7/25 w/ RLE cellulitis in spite of 10d Cephalexin also cc: N/V.  The following day developed encephalopathy and hypercarbia, was transferred to the ICU and intubated, diuresed aggressively in the ICU subsequently developed acute renal failure -Eventually extubated on 8/1 -Kidney function continued to worsen from normal range up to 3, started on IV fluids on 8/3 -Transferred from PCCM to [facility] 8/4 -kidney function and mental status improving, hospital course complicated by recurrent urinary retention requiring Foley catheter placement -CIR evaluating  Assessment & Plan:   Acute hypercarbic respiratory failure Chronic hypoxic respiratory failure/ILD Suspected OHS/OSA Acute on chronic diastolic CHF Sepsis -Status post ventilator dependent respiratory failure, intubated 7/26 and extubated 8/1 -Diuresed aggressively in the ICU -Diuretics then held due to progressive renal failure -Slowly improving, weaned O2 to 2-3L, on 3L at baseline -Diuretics on hold, auto diuresing at this time -Will need pulmonary follow-up and sleep study -Ambulate, out of bed to chair -DC planning, CIR being considered -Remains stable, family agrees to CIR   Sepsis, right lower extremity cellulitis, present on admission -Now resolved -Failed outpatient 10-day course of Keflex -CT on admission noted cellulitis, without evidence of myositis or drainable fluid collection -Treated with 4 days of IV vancomycin till 7/28 and 8 days of IV Zosyn till  8/1 -Completed antibiotic course, clinically improving -Cultures negative  Acute kidney injury -From aggressive diuresis, and mild component of hydronephrosis -Baseline creatinine of 0.9, creatinine trended up to 3.1 with overdiuresis and sepsis -Now improving, discontinue fluids this afternoon -Diuretics on hold -Foley catheter placed in the ICU 8/3 due to mild hydronephrosis in the setting of AKI and urinary retention -Kidney function has normalized, removed Foley catheter 8/5 AM subsequently had multiple episodes of urinary retention eventually Foley catheter replaced early a.m. on 8/6 -Started on Flomax, voiding trial once more ambulatory at rehab  Metabolic encephalopathy -In the setting of sepsis and hypercarbia -Improving,  -Resumed Lyrica at low-dose  History of ITP -Platelet count has normalized  Type 2 diabetes mellitus Obesity -CBGS stable, needs to lose weight  Gallstones -Noted on ultrasound done in ICU for nausea, denies any pain, no right upper quadrant tenderness -LFTS unremarkable, Monitor clinically -Follow-up with general surgery for elective cholecystectomy  Chronic respiratory failure Interstitial lung disease -On 3 L home O2 at baseline, continue same   DVT prophylaxis: Lovenox Code Status: Full code Family Communication: Discussed with patient in detail, sister at bedside 8/6 Disposition Plan:  Status is: Inpatient  Remains inpatient appropriate because:Inpatient level of care appropriate due to severity of illness, now medically stable for CIR   Dispo: The patient is from: Home              Anticipated d/c is to: CIR              Anticipated d/c date is: 1-2days              Patient currently is medically stable to d/c.  Consultants:   PCCM Transfer   Procedures:   Antimicrobials:    Subjective: -No events  overnight, breathing okay eating better, motivated to move  Objective: Vitals:   11/21/19 1559 11/21/19 2016 11/22/19 0429  11/22/19 0840  BP: (!) 161/83 (!) 155/83 (!) 144/81 140/77  Pulse: 84 92 91 88  Resp: _0 Temp: 99.1 F (37.3 C) 98.5 F (36.9 C) 98.8 F (37.1 C) 98.2 F (36.8 C)  TempSrc: Oral  Oral Oral  SpO2: 95% 100% 100% 96%  Weight:      Height:        Intake/Output Summary (Last 24 hours) at 11/22/2019 1009 Last data filed at 11/22/2019 0900 Gross per 24 hour  Intake 600 ml  Output 2100 ml  Net -1500 ml   Filed Weights   11/18/19 0233 11/19/19 0500 11/21/19 0409  Weight: 120.6 kg 120.6 kg 116.4 kg    Examination:  General exam: Obese elderly female sitting up in bed, awake alert oriented to self place and partly to time HEENT: No JVD CVS: S1-S2, decreased breath sounds the bases otherwise clear abdominal: Soft, tender, bowel sounds present Extremities: Right leg with improvement in nerve, mild discoloration Skin: As above Psychiatry:  Mood & affect appropriate.     Data Reviewed:   CBC: Recent Labs  Lab 11/18/19 0227 11/19/19 0627 11/20/19 0404 11/21/19 0518 11/22/19 0350  WBC 8.6 5.5 3.7* 6.5 5.6  HGB 11.0* 10.6* 10.5* 11.0* 10.8*  HCT 34.1* 34.8* 34.0* 36.6 34.9*  MCV 98.6 100.3* 102.4* 104.0* 102.6*  PLT 152 183 190 181 127   Basic Metabolic Panel: Recent Labs  Lab 11/17/19 0307 11/17/19 1602 11/18/19 0227 11/19/19 0627 11/20/19 0404 11/20/19 0837 11/20/19 1350 11/21/19 0518 11/21/19 1316 11/22/19 0350  NA 142   < > 139   < > 150*  --  155* 149* 147* 144  K 4.4   < > 3.5   < > 2.7*  --  2.8* 4.0 3.7 3.4*  CL 102   < > 96*   < > 111  --  115* 109 111 111  CO2 27   < > 27   < > 29  --  _1 GLUCOSE 138*   < > 153*   < > 134*  --  130* 179* 165* 127*  BUN 79*   < > 91*   < > 23  --  24* _2 CREATININE 2.42*   < > 3.16*   < > 0.93  --  1.10* 0.85 0.84 0.74  CALCIUM 10.1   < > 9.8   < > 10.2  --  10.3 10.2 10.3 9.8  MG 2.1  --  2.2  --   --  1.4*  --   --   --   --   PHOS 7.5*  --  8.4*  --   --   --   --   --   --   --    < > =  values in this interval not displayed.   GFR: Estimated Creatinine Clearance: 81.7 mL/min (by C-G formula based on SCr of 0.74 mg/dL). Liver Function Tests: Recent Labs  Lab 11/18/19 1523 11/20/19 0404 11/21/19 0518 11/22/19 0350  AST 22 14* 16 15  ALT _3 ALKPHOS 41 36* 43 41  BILITOT 1.2 0.9 0.9 0.8  PROT 6.7 6.1* 6.7 6.2*  ALBUMIN 3.4* 3.0* 3.3* 3.2*   No results for input(s): LIPASE, AMYLASE in the last 168 hours. No results for input(s): AMMONIA in the last  168 hours. Coagulation Profile: No results for input(s): INR, PROTIME in the last 168 hours. Cardiac Enzymes: No results for input(s): CKTOTAL, CKMB, CKMBINDEX, TROPONINI in the last 168 hours. BNP (last 3 results) Recent Labs    03/03/19 1151  PROBNP 70.0   HbA1C: No results for input(s): HGBA1C in the last 72 hours. CBG: Recent Labs  Lab 11/21/19 1555 11/21/19 2015 11/22/19 0106 11/22/19 0452 11/22/19 0716  GLUCAP 124* 177* 159* 108* 115*   Lipid Profile: No results for input(s): CHOL, HDL, LDLCALC, TRIG, CHOLHDL, LDLDIRECT in the last 72 hours. Thyroid Function Tests: No results for input(s): TSH, T4TOTAL, FREET4, T3FREE, THYROIDAB in the last 72 hours. Anemia Panel: No results for input(s): VITAMINB12, FOLATE, FERRITIN, TIBC, IRON, RETICCTPCT in the last 72 hours. Urine analysis:    Component Value Date/Time   COLORURINE YELLOW 11/09/2019 1236   APPEARANCEUR CLOUDY (A) 11/09/2019 1236   LABSPEC 1.021 11/09/2019 1236   PHURINE 7.0 11/09/2019 1236   GLUCOSEU NEGATIVE 11/09/2019 1236   HGBUR NEGATIVE 11/09/2019 1236   BILIRUBINUR NEGATIVE 11/09/2019 1236   [place] 11/09/2019 1236   PROTEINUR NEGATIVE 11/09/2019 1236   UROBILINOGEN 1.0 08/14/2019 1824   NITRITE NEGATIVE 11/09/2019 1236   LEUKOCYTESUR NEGATIVE 11/09/2019 1236   Sepsis Labs: _0 (procalcitonin:4,lacticidven:4)  ) No results found for this or any previous visit (from the past 240 hour(s)).        Radiology Studies: No results found.      Scheduled Meds: . chlorhexidine  15 mL Mouth Rinse BID  . Chlorhexidine Gluconate Cloth  6 each Topical Daily  . enoxaparin (LOVENOX) injection  60 mg Subcutaneous Q24H  . famotidine  40 mg Oral Daily  . insulin aspart  0-9 Units Subcutaneous Q4H  . mouth rinse  15 mL Mouth Rinse q12n4p  . metoprolol succinate  75 mg Oral Daily  . multivitamin with minerals  1 tablet Oral Daily  . pravastatin  20 mg Oral q1800  . pregabalin  50 mg Oral QPM  . sertraline  100 mg Oral QHS  . sodium chloride flush  3 mL Intravenous Once  . tamsulosin  0.4 mg Oral Daily   Continuous Infusions:    LOS: 13 days    Time spent: 87mn   PDomenic Polite MD [facility]  11/22/2019, 10:09 AM

## 2019-11-22 NOTE — Progress Notes (Signed)
Patient had 9 runs of V tach this morning. MD was notify patient is stable. No complaints. MD return call and gave instructions.

## 2019-11-22 NOTE — Plan of Care (Signed)
  Problem: Safety: Goal: Ability to remain free from injury will improve Outcome: Progressing

## 2019-11-23 LAB — COMPREHENSIVE METABOLIC PANEL
ALT: 13 U/L (ref 0–44)
AST: 14 U/L — ABNORMAL LOW (ref 15–41)
Albumin: 3 g/dL — ABNORMAL LOW (ref 3.5–5.0)
Alkaline Phosphatase: 46 U/L (ref 38–126)
Anion gap: 9 (ref 5–15)
BUN: 10 mg/dL (ref 8–23)
CO2: 27 mmol/L (ref 22–32)
Calcium: 9.7 mg/dL (ref 8.9–10.3)
Chloride: 109 mmol/L (ref 98–111)
Creatinine, Ser: 0.72 mg/dL (ref 0.44–1.00)
GFR calc Af Amer: >60 mL/min (ref >60)
GFR calc non Af Amer: >60 mL/min (ref >60)
Glucose, Bld: 132 mg/dL — ABNORMAL HIGH (ref 70–99)
Potassium: 3.6 mmol/L (ref 3.5–5.1)
Sodium: 145 mmol/L (ref 135–145)
Total Bilirubin: 0.5 mg/dL (ref 0.3–1.2)
Total Protein: 6.4 g/dL — ABNORMAL LOW (ref 6.5–8.1)

## 2019-11-23 LAB — GLUCOSE, CAPILLARY
Glucose-Capillary: 121 mg/dL — ABNORMAL HIGH (ref 70–99)
Glucose-Capillary: 121 mg/dL — ABNORMAL HIGH (ref 70–99)
Glucose-Capillary: 121 mg/dL — ABNORMAL HIGH (ref 70–99)
Glucose-Capillary: 157 mg/dL — ABNORMAL HIGH (ref 70–99)
Glucose-Capillary: 162 mg/dL — ABNORMAL HIGH (ref 70–99)
Glucose-Capillary: 183 mg/dL — ABNORMAL HIGH (ref 70–99)

## 2019-11-23 LAB — CBC
HCT: 34.7 % — ABNORMAL LOW (ref 36.0–46.0)
Hemoglobin: 10.7 g/dL — ABNORMAL LOW (ref 12.0–15.0)
MCH: 31.4 pg (ref 26.0–34.0)
MCHC: 30.8 g/dL (ref 30.0–36.0)
MCV: 101.8 fL — ABNORMAL HIGH (ref 80.0–100.0)
Platelets: 132 10*3/uL — ABNORMAL LOW (ref 150–400)
RBC: 3.41 MIL/uL — ABNORMAL LOW (ref 3.87–5.11)
RDW: 17.3 % — ABNORMAL HIGH (ref 11.5–15.5)
WBC: 5.3 10*3/uL (ref 4.0–10.5)
nRBC: 0 % (ref 0.0–0.2)

## 2019-11-23 MED ORDER — INSULIN ASPART 100 UNIT/ML ~~LOC~~ SOLN: 0.0000 [IU] | Freq: Every day | SUBCUTANEOUS | Status: DC

## 2019-11-23 MED ORDER — SENNOSIDES-DOCUSATE SODIUM 8.6-50 MG PO TABS
1.0000 | ORAL_TABLET | Freq: Two times a day (BID) | ORAL | Status: DC
  Administered 2019-11-23 – 2019-12-01 (×16): 1 via ORAL
  Filled 2019-11-23 (×17): qty 1

## 2019-11-23 MED ORDER — INSULIN ASPART 100 UNIT/ML ~~LOC~~ SOLN
0.0000 [IU] | Freq: Three times a day (TID) | SUBCUTANEOUS | Status: DC
  Administered 2019-11-23: 3 [IU] via SUBCUTANEOUS
  Administered 2019-11-23: 2 [IU] via SUBCUTANEOUS
  Administered 2019-11-23: 3 [IU] via SUBCUTANEOUS
  Administered 2019-11-24 (×3): 2 [IU] via SUBCUTANEOUS
  Administered 2019-11-25: 1 [IU] via SUBCUTANEOUS
  Administered 2019-11-25: 2 [IU] via SUBCUTANEOUS
  Administered 2019-11-26 – 2019-11-27 (×3): 3 [IU] via SUBCUTANEOUS
  Administered 2019-11-27: 2 [IU] via SUBCUTANEOUS
  Administered 2019-11-28: 1 [IU] via SUBCUTANEOUS
  Administered 2019-11-28 – 2019-11-29 (×2): 3 [IU] via SUBCUTANEOUS
  Administered 2019-11-30: 2 [IU] via SUBCUTANEOUS
  Administered 2019-11-30: 3 [IU] via SUBCUTANEOUS
  Administered 2019-12-01 (×2): 2 [IU] via SUBCUTANEOUS

## 2019-11-23 MED ORDER — MENTHOL 3 MG MT LOZG
1.0000 | LOZENGE | OROMUCOSAL | Status: DC | PRN
  Administered 2019-11-23: 3 mg via ORAL
  Filled 2019-11-23: qty 9

## 2019-11-23 NOTE — Progress Notes (Signed)
Occupational Therapy Treatment Patient Details Name: Kathy Irwin MRN: [id number] DOB: [birth year] Today's Date: 11/23/2019    History of present illness Pt is a 69 year old female presents 7/25 with dx of RLE cellulitis. 7/26 pt developed worsening delirium and desaturated requiring intubation 7/27, pt extubated 8/1. PMH obesity, chronic diastolic failure, ITP, chronic hypoxemic respiratory failure on 3 L nasal cannula at baseline secondary to interstitial lung disease. 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  History of COVID-19 in September 2020.   OT comments  Pt. Seen for skilled OT treatment session. Bed mobility with min guard a.  LB dressing set up to adjust socks while seated.  bsc to/from min/mod a.  Requires mod verbal cues for sequencing and safety with poor carryover.    Follow Up Recommendations  SNF;Supervision/Assistance - 24 hour    Equipment Recommendations       Recommendations for Other Services      Precautions / Restrictions Precautions Precautions: Fall Precaution Comments: foley       Mobility Bed Mobility Overal bed mobility: Needs Assistance Bed Mobility: Supine to Sit     Supine to sit: Min guard     General bed mobility comments: used bed rails and hob elevated but no physical assistance required. verbal cues and instructions to scoot to eob and place feet on floor for support  Transfers Overall transfer level: Needs assistance Equipment used: Rolling walker (2 wheeled) Transfers: Sit to/from [facility] Sit to Stand: Min assist Stand pivot transfers: Mod assist       General transfer comment: despite max cueing and explanation of reasoning for safe hand placement and technique, pt still preferring to pull on RW with bilateral UEs to stand from EOB, and from bsc. max cues to reach for arm rests and eob before sitting and was unable to demonstrate carry over even with hand over hand    Balance                                            ADL either performed or assessed with clinical judgement   ADL Overall ADL's : Needs assistance/impaired                     Lower Body Dressing: Set up;Sitting/lateral leans Lower Body Dressing Details (indicate cue type and reason): able to turn in bed with R leg on bed and reach sock to adjust in semi long sitting. for L le pt. able to kick and hold it ouut straight to reach sock for adjustment. Toilet Transfer: Minimal assistance;BSC;RW;Stand-pivot;Cueing for sequencing;Cueing for safety Toilet Transfer Details (indicate cue type and reason): cues to initiate pivot steps to R for bsc transfer.  cues for rw management and to stay inside the rw.  cues for hand placement on arm rest before sitting and standing, no demonstration of either with max cues, heavy reliance on rw   Toileting - Clothing Manipulation Details (indicate cue type and reason): pt. completed transfer to bsc but di not need to use it     Functional mobility during ADLs: Minimal assistance;Moderate assistance;Rolling walker General ADL Comments: pt. requires cues and reminders of directions for task initiation and completion. will ask mid task "now what do you want me to be doing"     Vision       Perception  Praxis      Cognition Arousal/Alertness: Awake/alert Behavior During Therapy: Flat affect Overall Cognitive Status: Impaired/Different from baseline Area of Impairment: Attention;Memory;Following commands;Safety/judgement;Awareness;Problem solving                 Orientation Level: Disoriented to;Situation;Time Current Attention Level: Selective Memory: Decreased short-term memory Following Commands: Follows one step commands inconsistently;Follows one step commands with increased time Safety/Judgement: Decreased awareness of safety;Decreased awareness of deficits Awareness: Intellectual Problem Solving: Slow processing;Decreased  initiation;Difficulty sequencing;Requires verbal cues;Requires tactile cues General Comments: repeats instructions then during task will say "what do you want me to be doing again".        Exercises     Shoulder Instructions       General Comments      Pertinent Vitals/ Pain       Pain Assessment: No/denies pain  Home Living                                          Prior Functioning/Environment              Frequency  Min 2X/week        Progress Toward Goals  OT Goals(current goals can now be found in the care plan section)  Progress towards OT goals: Progressing toward goals     Plan      Co-evaluation                 AM-PAC OT "6 Clicks" Daily Activity     Outcome Measure   Help from another person eating meals?: A Little Help from another person taking care of personal grooming?: A Little Help from another person toileting, which includes using toliet, bedpan, or urinal?: A Lot Help from another person bathing (including washing, rinsing, drying)?: A Lot Help from another person to put on and taking off regular upper body clothing?: A Little Help from another person to put on and taking off regular lower body clothing?: A Lot 6 Click Score: 15    End of Session Equipment Utilized During Treatment: Gait belt;Oxygen;Rolling walker  OT Visit Diagnosis: Unsteadiness on feet (R26.81);Other abnormalities of gait and mobility (R26.89);Muscle weakness (generalized) (M62.81);Other symptoms and signs involving cognitive function;Pain   Activity Tolerance Patient tolerated treatment well   Patient Left in bed;with call bell/phone within reach;with bed alarm set;with family/visitor present   Nurse Communication          Time: 6979-4801 OT Time Calculation (min): 27 min  Charges: OT General Charges $OT Visit: 1 Visit OT Treatments $Self Care/Home Management : 23-37 mins  Kathy Irwin, COTA/L Acute  Rehabilitation [phone]   Kathy Irwin 11/23/2019, 12:06 PM

## 2019-11-23 NOTE — Progress Notes (Signed)
PROGRESS NOTE    Kathy Irwin  NWG:[id number] DOB: 04/30/1950 DOA: 11/08/2019 PCP: Kerin Perna, NP  Brief Narrative: 69 year old female with obesity, chronic diastolic failure, ITP, chronic hypoxemic respiratory failure on 3 L nasal cannula at baseline secondary to interstitial lung disease followed by Dr. Vaughan Browner in the ILD clinic at pulmonary clinic.  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  History of COVID-19 in September 2020 ->admitted 7/25 w/ RLE cellulitis in spite of 10d Cephalexin also cc: N/V.  The following day developed encephalopathy and hypercarbia, was transferred to the ICU and intubated, diuresed aggressively in the ICU subsequently developed acute renal failure -Eventually extubated on 8/1 -Kidney function continued to worsen from normal range up to 3, started on IV fluids on 8/3 -Transferred from PCCM to [facility] 8/4 -kidney function and mental status improving, hospital course complicated by recurrent urinary retention requiring Foley catheter placement -CIR following  Assessment & Plan:   Acute hypercarbic respiratory failure Chronic hypoxic respiratory failure/ILD Suspected OHS/OSA Acute on chronic diastolic CHF Sepsis -Status post ventilator dependent respiratory failure, intubated 7/26 and extubated 8/1 -Diuresed aggressively in the ICU -Diuretics then held due to progressive renal failure -Slowly improving, weaned O2 to 2-3L, on 3L at baseline -Diuretics on hold, auto diuresing at this time -Will need pulmonary follow-up and sleep study, and low dose PRN lasix at DC -Ambulate, out of bed to chair -DC planning, CIR being considered -Remains stable, family agrees to CIR, hopefully can go to rehab tomorrow   Sepsis, right lower extremity cellulitis, present on admission -Now resolved -Failed outpatient 10-day course of Keflex -CT on admission noted cellulitis, without evidence of myositis or drainable fluid collection -Treated with  4 days of IV vancomycin till 7/28 and 8 days of IV Zosyn till 8/1 -Completed antibiotic course, clinically improving -Cultures negative  Acute kidney injury -From aggressive diuresis, and mild component of hydronephrosis -Baseline creatinine of 0.9, creatinine trended up to 3.1 with overdiuresis and sepsis -Now improving, discontinue fluids this afternoon -Diuretics on hold -Foley catheter placed in the ICU 8/3 due to mild hydronephrosis in the setting of AKI and urinary retention -Kidney function has normalized, removed Foley catheter 8/5 AM subsequently had multiple episodes of urinary retention eventually Foley catheter replaced on 8/6 -Started on Flomax, voiding trial once more ambulatory at rehab  Metabolic encephalopathy -In the setting of sepsis and hypercarbia -Improving,  -Resumed Lyrica at low-dose  History of ITP -Platelet count stable, normal  Type 2 diabetes mellitus Obesity -CBGS stable, needs to lose weight  Gallstones -Noted on ultrasound done in ICU for nausea, denies any pain, no right upper quadrant tenderness -LFTS unremarkable, Monitor clinically -Follow-up with general surgery for elective cholecystectomy  Chronic respiratory failure Interstitial lung disease -On 3 L home O2 at baseline, continue same   DVT prophylaxis: Lovenox Code Status: Full code Family Communication: Discussed with patient in detail, sister at bedside 8/6, 8/7 Disposition Plan:  Status is: Inpatient  Remains inpatient appropriate because:Inpatient level of care appropriate due to severity of illness, now medically stable for CIR   Dispo: The patient is from: Home              Anticipated d/c is to: CIR              Anticipated d/c date is: 8/9 to CIR              Patient currently is medically stable to d/c.  Consultants:  PCCM Transfer   Procedures:   Antimicrobials:    Subjective: -Feels well, no issues overnight  Objective: Vitals:   11/22/19 1633 11/22/19  1700 11/22/19 2106 11/23/19 0429  BP: (!) 145/80 (!) 128/54 (!) 156/80 (!) 121/58  Pulse: 77 72 75 73  Resp: _0 Temp: 98 F (36.7 C)  98.3 F (36.8 C) 98.4 F (36.9 C)  TempSrc: Oral  Oral   SpO2: 98% 96% 92% 96%  Weight:      Height:        Intake/Output Summary (Last 24 hours) at 11/23/2019 1120 Last data filed at 11/23/2019 0900 Gross per 24 hour  Intake 660 ml  Output 1550 ml  Net -890 ml   Filed Weights   11/18/19 0233 11/19/19 0500 11/21/19 0409  Weight: 120.6 kg 120.6 kg 116.4 kg    Examination:  General exam: Obese elderly pleasant female sitting up in bed, AAOx3, no distress HEENT: No JVD CVS: S1-S2, decreased breath sounds to bases otherwise clear Abdomen: Soft, nontender, bowel sounds present GU with Foley catheter Extremities: No edema, cellulitis changes in right leg have resolved, mild discoloration noted  Skin: As above Psychiatry:  Mood & affect appropriate.     Data Reviewed:   CBC: Recent Labs  Lab 11/19/19 0627 11/20/19 0404 11/21/19 0518 11/22/19 0350 11/23/19 0251  WBC 5.5 3.7* 6.5 5.6 5.3  HGB 10.6* 10.5* 11.0* 10.8* 10.7*  HCT 34.8* 34.0* 36.6 34.9* 34.7*  MCV 100.3* 102.4* 104.0* 102.6* 101.8*  PLT 183 190 181 164 664*   Basic Metabolic Panel: Recent Labs  Lab 11/17/19 0307 11/17/19 1602 11/18/19 0227 11/19/19 0627 11/20/19 0404 11/20/19 0837 11/20/19 1350 11/21/19 0518 11/21/19 1316 11/22/19 0350 11/23/19 0251  NA 142   < > 139   < >   < >  --  155* 149* 147* 144 145  K 4.4   < > 3.5   < >   < >  --  2.8* 4.0 3.7 3.4* 3.6  CL 102   < > 96*   < >   < >  --  115* 109 111 111 109  CO2 27   < > 27   < >   < >  --  _1 GLUCOSE 138*   < > 153*   < >   < >  --  130* 179* 165* 127* 132*  BUN 79*   < > 91*   < >   < >  --  24* _2 CREATININE 2.42*   < > 3.16*   < >   < >  --  1.10* 0.85 0.84 0.74 0.72  CALCIUM 10.1   < > 9.8   < >   < >  --  10.3 10.2 10.3 9.8 9.7  MG 2.1  --  2.2  --   --  1.4*  --    --   --   --   --   PHOS 7.5*  --  8.4*  --   --   --   --   --   --   --   --    < > = values in this interval not displayed.   GFR: Estimated Creatinine Clearance: 81.7 mL/min (by C-G formula based on SCr of 0.72 mg/dL). Liver Function Tests: Recent Labs  Lab 11/18/19 1523 11/20/19 0404 11/21/19 0518 11/22/19 0350 11/23/19 0251  AST 22 14* 16  15 14*  ALT _0 ALKPHOS 41 36* 43 41 46  BILITOT 1.2 0.9 0.9 0.8 0.5  PROT 6.7 6.1* 6.7 6.2* 6.4*  ALBUMIN 3.4* 3.0* 3.3* 3.2* 3.0*   No results for input(s): LIPASE, AMYLASE in the last 168 hours. No results for input(s): AMMONIA in the last 168 hours. Coagulation Profile: No results for input(s): INR, PROTIME in the last 168 hours. Cardiac Enzymes: No results for input(s): CKTOTAL, CKMB, CKMBINDEX, TROPONINI in the last 168 hours. BNP (last 3 results) Recent Labs    03/03/19 1151  PROBNP 70.0   HbA1C: No results for input(s): HGBA1C in the last 72 hours. CBG: Recent Labs  Lab 11/22/19 1632 11/22/19 2104 11/23/19 0119 11/23/19 0430 11/23/19 0640  GLUCAP 136* 135* 121* 121* 121*   Lipid Profile: No results for input(s): CHOL, HDL, LDLCALC, TRIG, CHOLHDL, LDLDIRECT in the last 72 hours. Thyroid Function Tests: No results for input(s): TSH, T4TOTAL, FREET4, T3FREE, THYROIDAB in the last 72 hours. Anemia Panel: No results for input(s): VITAMINB12, FOLATE, FERRITIN, TIBC, IRON, RETICCTPCT in the last 72 hours. Urine analysis:    Component Value Date/Time   COLORURINE YELLOW 11/09/2019 1236   APPEARANCEUR CLOUDY (A) 11/09/2019 1236   LABSPEC 1.021 11/09/2019 1236   PHURINE 7.0 11/09/2019 1236   GLUCOSEU NEGATIVE 11/09/2019 1236   HGBUR NEGATIVE 11/09/2019 1236   BILIRUBINUR NEGATIVE 11/09/2019 1236   [place] 11/09/2019 1236   PROTEINUR NEGATIVE 11/09/2019 1236   UROBILINOGEN 1.0 08/14/2019 1824   NITRITE NEGATIVE 11/09/2019 1236   LEUKOCYTESUR NEGATIVE 11/09/2019 1236   Sepsis  Labs: _1 (procalcitonin:4,lacticidven:4)  ) No results found for this or any previous visit (from the past 240 hour(s)).       Radiology Studies: No results found.      Scheduled Meds: . chlorhexidine  15 mL Mouth Rinse BID  . Chlorhexidine Gluconate Cloth  6 each Topical Daily  . enoxaparin (LOVENOX) injection  60 mg Subcutaneous Q24H  . famotidine  40 mg Oral Daily  . insulin aspart  0-15 Units Subcutaneous TID WC  . insulin aspart  0-5 Units Subcutaneous QHS  . mouth rinse  15 mL Mouth Rinse q12n4p  . metoprolol succinate  75 mg Oral Daily  . multivitamin with minerals  1 tablet Oral Daily  . pravastatin  20 mg Oral q1800  . pregabalin  50 mg Oral QPM  . senna-docusate  1 tablet Oral BID  . sertraline  100 mg Oral QHS  . sodium chloride flush  3 mL Intravenous Once  . tamsulosin  0.4 mg Oral Daily   Continuous Infusions:    LOS: 14 days    Time spent: 43mn   PDomenic Polite MD [facility]  11/23/2019, 11:20 AM

## 2019-11-24 ENCOUNTER — Other Ambulatory Visit (HOSPITAL_COMMUNITY): Payer: Medicare Other

## 2019-11-24 LAB — COMPREHENSIVE METABOLIC PANEL
ALT: 13 U/L (ref 0–44)
AST: 14 U/L — ABNORMAL LOW (ref 15–41)
Albumin: 3 g/dL — ABNORMAL LOW (ref 3.5–5.0)
Alkaline Phosphatase: 44 U/L (ref 38–126)
Anion gap: 7 (ref 5–15)
BUN: 10 mg/dL (ref 8–23)
CO2: 25 mmol/L (ref 22–32)
Calcium: 9.3 mg/dL (ref 8.9–10.3)
Chloride: 108 mmol/L (ref 98–111)
Creatinine, Ser: 0.73 mg/dL (ref 0.44–1.00)
GFR calc Af Amer: >60 mL/min (ref >60)
GFR calc non Af Amer: >60 mL/min (ref >60)
Glucose, Bld: 154 mg/dL — ABNORMAL HIGH (ref 70–99)
Potassium: 3.4 mmol/L — ABNORMAL LOW (ref 3.5–5.1)
Sodium: 140 mmol/L (ref 135–145)
Total Bilirubin: 0.4 mg/dL (ref 0.3–1.2)
Total Protein: 6 g/dL — ABNORMAL LOW (ref 6.5–8.1)

## 2019-11-24 LAB — GLUCOSE, CAPILLARY
Glucose-Capillary: 148 mg/dL — ABNORMAL HIGH (ref 70–99)
Glucose-Capillary: 146 mg/dL — ABNORMAL HIGH (ref 70–99)
Glucose-Capillary: 150 mg/dL — ABNORMAL HIGH (ref 70–99)
Glucose-Capillary: 100 mg/dL — ABNORMAL HIGH (ref 70–99)

## 2019-11-24 MED ORDER — METOPROLOL SUCCINATE ER 50 MG PO TB24: 75.0000 mg | ORAL_TABLET | Freq: Every day | ORAL | Status: DC

## 2019-11-24 MED ORDER — POLYETHYLENE GLYCOL 3350 17 G PO PACK
17.0000 g | PACK | Freq: Every day | ORAL | Status: DC | PRN
  Administered 2019-11-30: 17 g via ORAL
  Filled 2019-11-24: qty 1

## 2019-11-24 MED ORDER — TAMSULOSIN HCL 0.4 MG PO CAPS: 0.4000 mg | ORAL_CAPSULE | Freq: Every day | ORAL | Status: DC

## 2019-11-24 MED ORDER — POLYETHYLENE GLYCOL 3350 17 G PO PACK: 17.0000 g | PACK | Freq: Every day | ORAL | 0 refills | Status: DC | PRN

## 2019-11-24 MED ORDER — PREGABALIN 50 MG PO CAPS: 50.0000 mg | ORAL_CAPSULE | Freq: Every day | ORAL | 0 refills | Status: DC

## 2019-11-24 MED ORDER — INSULIN ASPART 100 UNIT/ML ~~LOC~~ SOLN: 0.0000 [IU] | Freq: Three times a day (TID) | SUBCUTANEOUS | 11 refills | Status: DC

## 2019-11-24 MED ORDER — INSULIN ASPART 100 UNIT/ML ~~LOC~~ SOLN: 0.0000 [IU] | Freq: Every day | SUBCUTANEOUS | 11 refills | Status: DC

## 2019-11-24 MED ORDER — SENNOSIDES-DOCUSATE SODIUM 8.6-50 MG PO TABS: 1.0000 | ORAL_TABLET | Freq: Two times a day (BID) | ORAL | Status: DC

## 2019-11-24 MED ORDER — POTASSIUM CHLORIDE CRYS ER 20 MEQ PO TBCR
40.0000 meq | EXTENDED_RELEASE_TABLET | Freq: Once | ORAL | Status: AC
  Administered 2019-11-24: 40 meq via ORAL
  Filled 2019-11-24: qty 2

## 2019-11-24 MED ORDER — FUROSEMIDE 40 MG PO TABS: 20.0000 mg | ORAL_TABLET | Freq: Every day | ORAL | Status: DC

## 2019-11-24 MED ORDER — ENSURE MAX PROTEIN PO LIQD
11.0000 [oz_av] | Freq: Two times a day (BID) | ORAL | Status: DC
  Administered 2019-11-24 – 2019-11-30 (×9): 11 [oz_av] via ORAL
  Filled 2019-11-24 (×15): qty 330

## 2019-11-24 MED ORDER — ENSURE ENLIVE PO LIQD: 237.0000 mL | Freq: Two times a day (BID) | ORAL | Status: DC

## 2019-11-24 NOTE — Progress Notes (Addendum)
Nutrition Follow-up  DOCUMENTATION CODES:   Morbid obesity  INTERVENTION:  Ensure Max po BID, each supplement provides 150 kcal and 30 grams of protein   NUTRITION DIAGNOSIS:   Inadequate oral intake related to nausea, vomiting as evidenced by per patient/family report.  ongoing  GOAL:   Patient will meet greater than or equal to 90% of their needs  progressing  MONITOR:   Diet advancement, PO intake, Supplement acceptance, Labs, Skin  REASON FOR ASSESSMENT:   Ventilator, Consult Enteral/tube feeding initiation and management  ASSESSMENT:   69 yo female admitted with septic shock, cellulitis R leg. PMH includes ILD-baseline 3 L [place], obesity, ITP, HF, COVID-19.  7/26 intubated 8/1 extubated  Pt unavailable at time of RD visit. Per MD, pt is medically stable for d/c to CIR.   PO Intake: 10-100% x last 8 recorded meals (67.5% average meal intake)  Labs: K+ 3.4 (L), CBGs 146-183 Medications: Pepcid, Novolog, MVI, Senokot-S  Diet Order:   Diet Order            Diet Carb Modified Fluid consistency: Thin; Room service appropriate? Yes  Diet effective now                 EDUCATION NEEDS:   Not appropriate for education at this time  Skin:  Skin Assessment: Reviewed RN Assessment  Last BM:  8/5 type 7  Height:   Ht Readings from Last 1 Encounters:  11/11/19 5' 3" (1.6 m)    Weight:   Wt Readings from Last 1 Encounters:  11/24/19 117 kg    Ideal Body Weight:  52.3 kg  BMI:  Body mass index is 45.69 kg/m.  Estimated Nutritional Needs:   Kcal:  1700-2000  Protein:  110-130 gm  Fluid:  >/= 1.7 L    Larkin Ina, MS, RD, LDN RD pager number and weekend/on-call pager number located in [place].

## 2019-11-24 NOTE — Plan of Care (Signed)
  Problem: Clinical Measurements: Goal: Ability to maintain clinical measurements within normal limits will improve Outcome: Progressing   Problem: Safety: Goal: Ability to remain free from injury will improve Outcome: Progressing   Problem: Skin Integrity: Goal: Risk for impaired skin integrity will decrease Outcome: Progressing

## 2019-11-24 NOTE — Progress Notes (Signed)
Physical Therapy Treatment Patient Details Name: Kathy Irwin MRN: [id number] DOB: Jun 23, 1950 Today's Date: 11/24/2019    History of Present Illness Pt is a 69 year old female presents 7/25 with dx of RLE cellulitis. 7/26 pt developed worsening delirium and desaturated requiring intubation 7/27, pt extubated 8/1. PMH obesity, chronic diastolic failure, ITP, chronic hypoxemic respiratory failure on 3 L nasal cannula at baseline secondary to interstitial lung disease. 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  History of COVID-19 in September 2020.    PT Comments    The pt was in her bed upon arrival of PT, agreeable to session with focus on progression of mobility and functional strength. The pt continues to demo significant improvements in bed mobility and transfers, as well as with tolerance for ambulation and mobility. The pt was able to complete 2 short bouts of ambulation in her room with 3L of O2 and seated rest between each bout due to fatigue, but required minA only with RW. 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     Follow Up Recommendations  CIR     Equipment Recommendations   (defer to post acute)    Recommendations for Other Services       Precautions / Restrictions Precautions Precautions: Fall Precaution Comments: foley Restrictions Weight Bearing Restrictions: No    Mobility  Bed Mobility Overal bed mobility: Needs Assistance Bed Mobility: Supine to Sit     Supine to sit: Supervision;HOB elevated     General bed mobility comments: pt with use of elevateed HOB and rails, but no physical assist needed to raise trunk or reposition at EOB  Transfers Overall transfer level: Needs assistance Equipment used: Rolling walker (2 wheeled) Transfers: Sit to/from [facility] Sit to Stand: Min  guard Stand pivot transfers: Min guard       General transfer comment: pt able to stand from EOB and recliner with repeated cues for safety, but without physical assist to stand. The pt then took small steps to the recliner with close supervision for safety.  Ambulation/Gait   Gait Distance (Feet): 15 Feet (x2) Assistive device: Rolling walker (2 wheeled) Gait Pattern/deviations: Step-through pattern;Decreased step length - right;Decreased step length - left;Decreased stride length;Shuffle Gait velocity: decreased Gait velocity interpretation: <1.31 ft/sec, indicative of household ambulator General Gait Details: pt with very slow, unsteady gait with short steps, but was able to complete short bouts of walking in room followed by 3-5 min seated rest. SpO2 to 88% on 3L folowing first bout of walking.         Balance Overall balance assessment: Needs assistance Sitting-balance support: No upper extremity supported;Feet supported Sitting balance-Leahy Scale: Fair Sitting balance - Comments: supervision for safety   Standing balance support: Bilateral upper extremity supported Standing balance-Leahy Scale: Poor Standing balance comment: heavy reliance on bilateral UEs                            Cognition Arousal/Alertness: Awake/alert Behavior During Therapy: Flat affect Overall Cognitive Status: Impaired/Different from baseline Area of Impairment: Memory;Safety/judgement;Awareness;Problem solving;Orientation                 Orientation Level: Disoriented to;Time   Memory: Decreased short-term memory   Safety/Judgement: Decreased awareness of safety;Decreased awareness of deficits Awareness: Intellectual Problem Solving:  Slow processing;Decreased initiation;Difficulty sequencing;Requires verbal cues;Requires tactile cues General Comments: Pt able to follow instructions/commands today but did need thorough explanation of post-acute rehab options. Cues for  safety with transfers      Exercises      General Comments General comments (skin integrity, edema, etc.): SpO2 98-100% at rest on 3L, dropped to 88% following short amb on 3L, but returned to 90s within 1 min of seated rest      Pertinent Vitals/Pain Pain Assessment: No/denies pain           PT Goals (current goals can now be found in the care plan section) Acute Rehab PT Goals Patient Stated Goal: to et stronger and return home PT Goal Formulation: With patient Time For Goal Achievement: 12/03/19 Potential to Achieve Goals: Good Progress towards PT goals: Progressing toward goals    Frequency    Min 3X/week      PT Plan Current plan remains appropriate       AM-PAC PT "6 Clicks" Mobility   Outcome Measure  Help needed turning from your back to your side while in a flat bed without using bedrails?: None Help needed moving from lying on your back to sitting on the side of a flat bed without using bedrails?: A Little Help needed moving to and from a bed to a chair (including a wheelchair)?: A Little Help needed standing up from a chair using your arms (e.g., wheelchair or bedside chair)?: A Little Help needed to walk in hospital room?: A Little Help needed climbing 3-5 steps with a railing? : A Lot 6 Click Score: 18    End of Session Equipment Utilized During Treatment: Gait belt;Oxygen Activity Tolerance: Patient limited by fatigue Patient left: with call bell/phone within reach;in chair;with chair alarm set;with nursing/sitter in room Nurse Communication: Mobility status PT Visit Diagnosis: Other abnormalities of gait and mobility (R26.89);Muscle weakness (generalized) (M62.81)     Time: 9509-3267 PT Time Calculation (min) (ACUTE ONLY): 24 min  Charges:  $Gait Training: 23-37 mins                     Karma Ganja, PT, DPT   Acute Rehabilitation Department Pager #: [phone]   Otho Bellows 11/24/2019, 11:32 AM

## 2019-11-24 NOTE — Discharge Summary (Addendum)
Physician Discharge Summary  Kathy Irwin XTK:[id number] DOB: 1951/04/13 DOA: 11/08/2019  PCP: Kerin Perna, NP  Admit date: 11/08/2019 Discharge date: 11/25/2019  Time spent: 35 minutes  Recommendations for Outpatient Follow-up:  1. Voiding trial in rehab once she is more ambulatory and attempt to remove Foley catheter then, if fails this will need urology follow-up 2. PCP in 1 week 3. Resume Lasix at low-dose in 2 to 3 days 4. Nonurgent follow-up with general surgery for gallstones   Discharge Diagnoses:  Acute on chronic hypoxic and hypercarbic respiratory failure Morbid obesity Suspected OSA/OHS Acute on chronic diastolic CHF Chronic hypoxic respiratory failure on 3 L home O2 at baseline for interstitial lung disease   Cellulitis of right lower leg   Insulin-requiring or dependent type II diabetes mellitus (HCC)   HTN (hypertension)   Acute respiratory failure (HCC)   Chronic diastolic CHF (congestive heart failure) (HCC)   Chronic respiratory failure with hypoxia (HCC)   Sepsis ([place]) Frequent urinary retention requiring Foley catheter placement  Discharge Condition: Stable  Diet recommendation: Diabetic, heart healthy  Filed Weights   11/21/19 0409 11/23/19 2040 11/24/19 0500  Weight: 116.4 kg 117 kg 117 kg    History of present illness:  69 year old female with obesity, chronic diastolic failure, ITP, chronic hypoxemic respiratory failure on 3 L nasal cannula at baseline secondary to interstitial lung disease followed by Dr. Vaughan Browner in the ILD clinic at pulmonary clinic. 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 History of COVID-19 in September 2020 ->admitted 7/25 w/ RLE cellulitis in spite of 10d Cephalexin also cc: N/V.  The following day developed encephalopathy and hypercarbia, was transferred to the ICU and intubated, diuresed aggressively in the ICU subsequently developed acute renal failure  Hospital Course:   Acute hypercarbic  respiratory failure Chronic hypoxic respiratory failure/ILD Suspected OHS/OSA Acute on chronic diastolic CHF Sepsis -Status post ventilator dependent respiratory failure, intubated 7/26 and extubated 8/1, respiratory failure was felt to be secondary to sepsis, fluid overload and hypercarbia from OHS/OSA -Diuresed aggressively in the ICU -Diuretics then held due to progressive renal failure -Slowly improving, weaned O2 to 2-3L, on 3L at baseline -Diuretics on hold, auto diuresing at this time -Will need pulmonary follow-up and sleep study, and low dose Lasix 20 mg at discharge -Ambulate, out of bed to chair -PT OT recommended CIR for rehabilitation   Sepsis, right lower extremity cellulitis, present on admission -Now resolved -Failed outpatient 10-day course of Keflex -CT on admission noted cellulitis, without evidence of myositis or drainable fluid collection -Treated with 4 days of IV vancomycin till 7/28 and 8 days of IV Zosyn till 8/1 -Completed antibiotic course, clinically improving -Cultures negative  Acute kidney injury -From aggressive diuresis, and mild component of hydronephrosis -Baseline creatinine of 0.9, creatinine trended up to 3.1 with overdiuresis and sepsis -Now improving, discontinue fluids this afternoon -Diuretics on hold -Foley catheter placed in the ICU 8/3 due to mild hydronephrosis in the setting of AKI and urinary retention -Kidney function has normalized, removed Foley catheter 8/5 AM subsequently had multiple episodes of urinary retention eventually Foley catheter replaced on 8/6 -Started on Flomax, voiding trial once more ambulatory at rehab -Resume low-dose Lasix in couple of days  Metabolic encephalopathy -In the setting of sepsis and hypercarbia -Improving,  -Resumed Lyrica at low-dose  History of ITP -Platelet count stable, normal  Type 2 diabetes mellitus Obesity -CBGS stable, needs to lose weight  Gallstones -Noted on ultrasound  done in  ICU for nausea, denies any pain, no right upper quadrant tenderness -LFTS unremarkable, Monitor clinically -Follow-up with general surgery for elective cholecystectomy  Chronic respiratory failure Interstitial lung disease -On 3 L home O2 at baseline, continue same   Consultants:   PCCM Transfer   Discharge Exam: Vitals:   11/24/19 0440 11/24/19 0853  BP: (!) 152/70 126/74  Pulse: 80 82  Resp: 16 18  Temp: 98.2 F (36.8 C) 98.6 F (37 C)  SpO2: 99% 99%    General: Alert awake, oriented to self place and partly to time Cardiovascular: S1-S2, regular rate rhythm Respiratory: Decreased breath sounds the bases, otherwise clear  Discharge Instructions    Allergies as of 11/24/2019      Reactions   Other Shortness Of Breath, Other (See Comments)   Newspaper ink =  new chest pain, also   Iodine Other (See Comments)   "Was a long time ago" (Reaction??)   Merbromin Other (See Comments)   Mercurochrome- "Was a long time ago" (Reaction??)   Tape       Medication List    STOP taking these medications   dextromethorphan 15 MG/5ML syrup   diphenhydrAMINE 25 MG tablet Commonly known as: BENADRYL   doxycycline 100 MG tablet Commonly known as: VIBRA-TABS   Insulin Lispro Prot & Lispro (75-25) 100 UNIT/ML Kwikpen Commonly known as: HumaLOG Mix 75/25 KwikPen   sulfamethoxazole-trimethoprim 800-160 MG tablet Commonly known as: BACTRIM DS     TAKE these medications   acetaminophen 500 MG tablet Commonly known as: TYLENOL Take 1,000 mg by mouth 2 (two) times daily as needed (for pain).   albuterol 108 (90 Base) MCG/ACT inhaler Commonly known as: VENTOLIN HFA Inhale 1-2 puffs into the lungs every 6 (six) hours as needed for wheezing or shortness of breath.   Azelastine-Fluticasone 137-50 MCG/ACT Susp Commonly known as: Dymista Place 1-2 sprays into both nostrils daily as needed (for allergies).   blood glucose meter kit and supplies Kit Dispense based  on patient and insurance preference. Use up to four times daily as directed. (FOR ICD-9 250.00, 250.01). For QAC - HS accuchecks.   [employer] D3 125 MCG (5000 UT) capsule Generic drug: Cholecalciferol Take 5,000 Units by mouth daily.   fluticasone 50 MCG/ACT nasal spray Commonly known as: FLONASE Place 2 sprays into both nostrils daily as needed for allergies or rhinitis.   FREESTYLE LITE test strip Generic drug: glucose blood For glucose testing every before meals at bedtime. Diagnosis E 11.65  Can substitute to any accepted brand   furosemide 40 MG tablet Commonly known as: LASIX Take 0.5 tablets (20 mg total) by mouth daily. What changed: how much to take   glimepiride 4 MG tablet Commonly known as: AMARYL Take 1 tablet (4 mg total) by mouth daily with breakfast.   insulin aspart 100 UNIT/ML injection Commonly known as: novoLOG Inject 0-5 Units into the skin at bedtime.   insulin aspart 100 UNIT/ML injection Commonly known as: novoLOG Inject 0-15 Units into the skin 3 (three) times daily with meals.   levocetirizine 5 MG tablet Commonly known as: XYZAL Take 1 tablet by mouth daily as needed for allergies.   metoprolol succinate 50 MG 24 hr tablet Commonly known as: TOPROL-XL Take 1.5 tablets (75 mg total) by mouth daily. What changed:   medication strength  how much to take   montelukast 10 MG tablet Commonly known as: SINGULAIR Take 10 mg by mouth at bedtime.   olopatadine 0.1 % ophthalmic solution Commonly known  as: Patanol Place 1 drop into both eyes 2 (two) times daily. What changed:   when to take this  reasons to take this   omeprazole 20 MG capsule Commonly known as: PRILOSEC Take 1 capsule (20 mg total) by mouth daily. What changed:   when to take this  reasons to take this   ondansetron 4 MG tablet Commonly known as: ZOFRAN Take 1 tablet (4 mg total) by mouth every 8 (eight) hours as needed for nausea or vomiting.   polyethylene glycol 17  g packet Commonly known as: MIRALAX / GLYCOLAX Take 17 g by mouth daily as needed for mild constipation or moderate constipation.   pravastatin 40 MG tablet Commonly known as: PRAVACHOL Take 40 mg by mouth at bedtime.   pregabalin 50 MG capsule Commonly known as: Lyrica Take 1 capsule (50 mg total) by mouth at bedtime. What changed: when to take this   senna-docusate 8.6-50 MG tablet Commonly known as: Senokot-S Take 1 tablet by mouth 2 (two) times daily.   sertraline 100 MG tablet Commonly known as: ZOLOFT Take 100 mg by mouth 3 times/day as needed-between meals & bedtime (for nerves).   sodium chloride 0.65 % Soln nasal spray Commonly known as: OCEAN Place 1 spray into both nostrils as needed for congestion.   tamsulosin 0.4 MG Caps capsule Commonly known as: FLOMAX Take 1 capsule (0.4 mg total) by mouth daily. Start taking on: November 25, 2019      Allergies  Allergen Reactions  . Other Shortness Of Breath and Other (See Comments)    Newspaper ink =  new chest pain, also  . Iodine Other (See Comments)    "Was a long time ago" (Reaction??)  . Merbromin Other (See Comments)    Mercurochrome- "Was a long time ago" (Reaction??)  . Tape       The results of significant diagnostics from this hospitalization (including imaging, microbiology, ancillary and laboratory) are listed below for reference.    Significant Diagnostic Studies: DG Abd 1 View  Result Date: 11/17/2019 CLINICAL DATA:  Pain with urination. EXAM: ABDOMEN - 1 VIEW COMPARISON:  CT dated November 09, 2019 FINDINGS: The bowel gas pattern is nonobstructive. There are no concerning radiopaque kidney stones. There are advanced degenerative changes of the right hip. IMPRESSION: Negative. Electronically Signed   By: Constance Holster M.D.   On: 11/17/2019 21:32   CT HEAD WO CONTRAST  Result Date: 11/09/2019 CLINICAL DATA:  69 year old female with history of mental status change. EXAM: CT HEAD WITHOUT CONTRAST  TECHNIQUE: Contiguous axial images were obtained from the base of the skull through the vertex without intravenous contrast. COMPARISON:  No priors. FINDINGS: Brain: Physiologic calcifications in the basal ganglia bilaterally. Patchy areas of very mild decreased attenuation are noted throughout the deep and periventricular white matter of the cerebral hemispheres bilaterally, compatible with mild chronic microvascular ischemic disease. No evidence of acute infarction, hemorrhage, hydrocephalus, extra-axial collection or mass lesion/mass effect. Vascular: No hyperdense vessel or unexpected calcification. Skull: Normal. Negative for fracture or focal lesion. Sinuses/Orbits: No acute finding. Other: None. IMPRESSION: 1. No acute intracranial abnormalities. 2. Very mild chronic microvascular ischemic changes in the cerebral white matter, as above. Electronically Signed   By: Vinnie Langton M.D.   On: 11/09/2019 16:30   CT ABDOMEN PELVIS W CONTRAST  Result Date: 11/09/2019 CLINICAL DATA:  Nausea, vomiting EXAM: CT ABDOMEN AND PELVIS WITH CONTRAST TECHNIQUE: Multidetector CT imaging of the abdomen and pelvis was performed using the standard protocol  following bolus administration of intravenous contrast. CONTRAST:  176m OMNIPAQUE IOHEXOL 300 MG/ML  SOLN COMPARISON:  08/15/2019, 02/11/2019 FINDINGS: Lower chest: Motion artifact limits evaluation of the lung bases, however, areas of interstitial thickening, ground-glass infiltrate, and traction bronchiolectasis are again identified, better evaluated on prior CT examination of 02/11/2019. the visualized heart and pericardium are unremarkable. Hepatobiliary: Porcelain gallbladder. Multiple gallstones seen within the gallbladder neck. No intra or extrahepatic biliary ductal dilation. Liver unremarkable. Pancreas: Unremarkable Spleen: Unremarkable Adrenals/Urinary Tract: The adrenal glands are unremarkable. The kidneys are unremarkable. The bladder is unremarkable.  Stomach/Bowel: The stomach, small bowel, and large bowel are unremarkable. Appendix normal. No free intraperitoneal gas or fluid. Vascular/Lymphatic: There is no pathologic adenopathy within the abdomen and pelvis. The abdominal vasculature is age-appropriate. Reproductive: Uterus absent. Residual ovaries are unremarkable. No adnexal masses. Other: Moderate fat containing umbilical hernia is present. Rectum unremarkable. Musculoskeletal: Degenerative changes are noted within the lumbar spine and hips. No acute bone abnormality. IMPRESSION: No radiographic explanation for the patient's reported symptoms. Incidental findings as noted above. Electronically Signed   By: AFidela SalisburyMD   On: 11/09/2019 21:45   [place]  Result Date: 11/18/2019 CLINICAL DATA:  Acute renal failure. EXAM: RENAL / URINARY TRACT ULTRASOUND COMPLETE COMPARISON:  November 09, 2019. FINDINGS: Right Kidney: Renal measurements: 13.2 x 5.9 x 5.6 cm = volume: 229 mL. Mild hydronephrosis is noted. Echogenicity within normal limits. No mass visualized. Left Kidney: Renal measurements: 12.0 x 6.3 x 6.5 cm = volume: 256 mL. Mild hydronephrosis is noted. Echogenicity within normal limits. No mass visualized. Bladder: Appears normal for degree of bladder distention. Ureteral jets are not visualized. Other: Gallstone is noted. IMPRESSION: Mild bilateral hydronephrosis is noted. Electronically Signed   By: JMarijo ConceptionM.D.   On: 11/18/2019 11:37   DG Chest Port 1 View  Result Date: 11/14/2019 CLINICAL DATA:  Acute respiratory failure. EXAM: PORTABLE CHEST 1 VIEW COMPARISON:  11/11/2019. FINDINGS: Endotracheal tube and NG tube in stable position. Cardiomegaly. Progressive bilateral pulmonary infiltrates/edema. Small bilateral pleural effusions. No pneumothorax. IMPRESSION: 1.  Endotracheal tube and NG tube in stable position. 2. Cardiomegaly. Progressive bilateral pulmonary infiltrates/edema. Small bilateral pleural effusions. Electronically Signed    By: TMarcello Moores Register   On: 11/14/2019 05:30   Portable Chest x-ray  Result Date: 11/11/2019 CLINICAL DATA:  Endotracheal and orogastric tube placement. EXAM: PORTABLE CHEST 1 VIEW COMPARISON:  November 09, 2019. FINDINGS: Stable cardiomediastinal silhouette. Endotracheal and nasogastric tubes appear to be in good position. No pneumothorax is noted. Bibasilar opacities are noted concerning for edema or possibly atelectasis. Small left pleural effusion cannot be excluded. Bony thorax is unremarkable. IMPRESSION: Endotracheal and nasogastric tubes in good position. Bibasilar opacities are noted concerning for edema or possibly atelectasis. Small left pleural effusion cannot be excluded. Electronically Signed   By: JMarijo ConceptionM.D.   On: 11/11/2019 12:16   DG Chest Portable 1 View  Result Date: 11/09/2019 CLINICAL DATA:  69year old female with possible aspiration. EXAM: PORTABLE CHEST 1 VIEW COMPARISON:  Chest radiograph dated 08/11/2019 and CT dated 08/15/2019. FINDINGS: Diffuse bilateral interstitial densities and bronchiectatic changes with greater involvement of the lower lung fields as seen on the prior CT and radiograph in keeping with chronic interstitial lung disease. Overall slight interval increase in pulmonary densities since the prior radiograph which may be related to poor inspiratory effort. Superimposed infiltrate is not entirely excluded. Clinical correlation is recommended. No lobar consolidation, large pleural effusion or pneumothorax. Stable cardiomediastinal silhouette.  No acute osseous pathology. IMPRESSION: 1. Chronic interstitial lung disease.  No focal consolidation. 2. Slight interval increase in the pulmonary densities may be related to poor inspiratory effort. Superimposed infiltrate is not entirely excluded. Clinical correlation is recommended. Electronically Signed   By: Anner Crete M.D.   On: 11/09/2019 15:43   CT EXTREMITY LOWER RIGHT WO CONTRAST  Result Date:  11/11/2019 CLINICAL DATA:  Right lower extremity swelling. EXAM: CT OF THE LOWER RIGHT EXTREMITY WITHOUT CONTRAST TECHNIQUE: Multidetector CT imaging of the right lower extremity was performed according to the standard protocol. COMPARISON:  None. FINDINGS: Moderate diffuse skin thickening and subcutaneous soft tissue swelling/edema/fluid suggesting cellulitis. I do not see a discrete fluid collection to suggest a drainable soft tissue abscess. No CT findings to suggest myofasciitis or pyomyositis. The bony structures are intact. No findings suspicious for osteomyelitis. Moderate to advanced tricompartmental degenerative changes are noted at the knee joint and mild degenerative changes noted at the ankle joint. I do not see any findings suspicious for septic arthritis. No knee joint effusion. Moderate calcifications are noted in the distal Achilles tendon and there is a moderate-sized calcaneal heel spur. IMPRESSION: 1. Moderate diffuse skin thickening and subcutaneous soft tissue swelling/edema/fluid suggesting cellulitis. No discrete fluid collection to suggest a drainable soft tissue abscess. 2. No CT findings to suggest myofasciitis or pyomyositis. 3. No CT findings suspicious for septic arthritis or osteomyelitis. 4. Moderate to advanced tricompartmental degenerative changes at the knee joint and mild degenerative changes at the ankle joint. Electronically Signed   By: Marijo Sanes M.D.   On: 11/11/2019 17:05   VAS [place] LOWER EXTREMITY VENOUS (DVT)  Result Date: 11/09/2019  Lower Venous DVTStudy Indications: Swelling, and cellulitis, failed outpatient antibiotics.  Risk Factors: Venous insufficiency. Limitations: Body habitus and edema. Comparison Study: Prior study from 07/17/19 is available for comparison Performing Technologist: Sharion Dove RVS  Examination Guidelines: A complete evaluation includes B-mode imaging, spectral Doppler, color Doppler, and power Doppler as needed of all accessible portions of  each vessel. Bilateral testing is considered an integral part of a complete examination. Limited examinations for reoccurring indications may be performed as noted. The reflux portion of the exam is performed with the patient in reverse Trendelenburg.  +---------+---------------+---------+-----------+----------+--------------+ RIGHT    CompressibilityPhasicitySpontaneityPropertiesThrombus Aging +---------+---------------+---------+-----------+----------+--------------+ CFV      Full           Yes      Yes                                 +---------+---------------+---------+-----------+----------+--------------+ SFJ      Full                                                        +---------+---------------+---------+-----------+----------+--------------+ FV Prox  Full                                                        +---------+---------------+---------+-----------+----------+--------------+ FV Mid   Full                                                        +---------+---------------+---------+-----------+----------+--------------+  FV DistalFull                                                        +---------+---------------+---------+-----------+----------+--------------+ PFV      Full                                                        +---------+---------------+---------+-----------+----------+--------------+ POP      Full           Yes      Yes                                 +---------+---------------+---------+-----------+----------+--------------+ PTV      Full                                                        +---------+---------------+---------+-----------+----------+--------------+ PERO                                                  Not visualized +---------+---------------+---------+-----------+----------+--------------+ GSV      Full                                                         +---------+---------------+---------+-----------+----------+--------------+ SSV      Full                                                        +---------+---------------+---------+-----------+----------+--------------+   +----+---------------+---------+-----------+----------+--------------+ LEFTCompressibilityPhasicitySpontaneityPropertiesThrombus Aging +----+---------------+---------+-----------+----------+--------------+ CFV Full           Yes      Yes                                 +----+---------------+---------+-----------+----------+--------------+     Summary: RIGHT: - No evidence of deep vein thrombosis in the lower extremity. No indirect evidence of obstruction proximal to the inguinal ligament. - Findings appear essentially unchanged compared to previous examination. interstitial edema throughout calf  LEFT: - No evidence of common femoral vein obstruction.  *See table(s) above for measurements and observations. Electronically signed by Monica Martinez MD on 11/09/2019 at 11:39:11 AM.    Final    US Abdomen Limited RUQ  Result Date: 11/18/2019 CLINICAL DATA:  Cholecystitis EXAM: ULTRASOUND ABDOMEN LIMITED RIGHT UPPER QUADRANT COMPARISON:  CT dated November 08, 2013 FINDINGS: Gallbladder: The gallbladder is filled with stones. The gallbladder wall is mildly thickened at 4 mm. There is  no pericholecystic free fluid. The sonographic Percell Miller sign cannot be adequately assessed on this study. The gallbladder wall again appears to be calcified. Common bile duct: Diameter: Cannot be visualized Liver: The echogenicity is increased. There is no discrete hepatic mass. Portal vein is patent on color Doppler imaging with normal direction of blood flow towards the liver. Other: None IMPRESSION: 1. Cholelithiasis with mild gallbladder wall thickening. The sonographic Percell Miller sign cannot be adequately assessed secondary to patient condition. If there is high clinical suspicion for acute  cholecystitis, follow-up with HIDA scan is recommended. 2. Probable hepatic steatosis. Electronically Signed   By: Constance Holster M.D.   On: 11/18/2019 18:21    Microbiology: No results found for this or any previous visit (from the past 240 hour(s)).   Labs: Basic Metabolic Panel: Recent Labs  Lab 11/18/19 0227 11/19/19 0627 11/20/19 0837 11/20/19 1350 11/21/19 0518 11/21/19 1316 11/22/19 0350 11/23/19 0251 11/24/19 0215  NA 139   < >  --    < > 149* 147* 144 145 140  K 3.5   < >  --    < > 4.0 3.7 3.4* 3.6 3.4*  CL 96*   < >  --    < > 109 111 111 109 108  CO2 27   < >  --    < > _0 GLUCOSE 153*   < >  --    < > 179* 165* 127* 132* 154*  BUN 91*   < >  --    < > _1 CREATININE 3.16*   < >  --    < > 0.85 0.84 0.74 0.72 0.73  CALCIUM 9.8   < >  --    < > 10.2 10.3 9.8 9.7 9.3  MG 2.2  --  1.4*  --   --   --   --   --   --   PHOS 8.4*  --   --   --   --   --   --   --   --    < > = values in this interval not displayed.   Liver Function Tests: Recent Labs  Lab 11/20/19 0404 11/21/19 0518 11/22/19 0350 11/23/19 0251 11/24/19 0215  AST 14* 16 15 14* 14*  ALT _2 ALKPHOS 36* 43 41 46 44  BILITOT 0.9 0.9 0.8 0.5 0.4  PROT 6.1* 6.7 6.2* 6.4* 6.0*  ALBUMIN 3.0* 3.3* 3.2* 3.0* 3.0*   No results for input(s): LIPASE, AMYLASE in the last 168 hours. No results for input(s): AMMONIA in the last 168 hours. CBC: Recent Labs  Lab 11/19/19 0627 11/20/19 0404 11/21/19 0518 11/22/19 0350 11/23/19 0251  WBC 5.5 3.7* 6.5 5.6 5.3  HGB 10.6* 10.5* 11.0* 10.8* 10.7*  HCT 34.8* 34.0* 36.6 34.9* 34.7*  MCV 100.3* 102.4* 104.0* 102.6* 101.8*  PLT 183 190 181 164 132*   Cardiac Enzymes: No results for input(s): CKTOTAL, CKMB, CKMBINDEX, TROPONINI in the last 168 hours. BNP: BNP (last 3 results) Recent Labs    08/01/19 1429 10/24/19 1924 11/14/19 1036  BNP 93.4 38.0 112.3*    ProBNP (last 3 results) Recent Labs    03/03/19 1151   PROBNP 70.0    CBG: Recent Labs  Lab 11/23/19 0640 11/23/19 1139 11/23/19 1629 11/23/19 2041 11/24/19 0639  GLUCAP 121* 157* 162* 183* 148*   Signed:  Domenic Polite MD.  [facility]  11/24/2019, 11:32 AM

## 2019-11-25 ENCOUNTER — Inpatient Hospital Stay (HOSPITAL_COMMUNITY): Admission: RE | Admit: 2019-11-25 | Payer: Medicare Other | Source: Ambulatory Visit

## 2019-11-25 LAB — GLUCOSE, CAPILLARY
Glucose-Capillary: 120 mg/dL — ABNORMAL HIGH (ref 70–99)
Glucose-Capillary: 144 mg/dL — ABNORMAL HIGH (ref 70–99)
Glucose-Capillary: 121 mg/dL — ABNORMAL HIGH (ref 70–99)
Glucose-Capillary: 173 mg/dL — ABNORMAL HIGH (ref 70–99)

## 2019-11-25 MED ORDER — PANTOPRAZOLE SODIUM 40 MG PO TBEC
40.0000 mg | DELAYED_RELEASE_TABLET | Freq: Every day | ORAL | Status: DC
  Administered 2019-11-25 – 2019-11-27 (×3): 40 mg via ORAL
  Filled 2019-11-25 (×3): qty 1

## 2019-11-25 NOTE — Progress Notes (Signed)
Physical Therapy Treatment Patient Details Name: Kathy Irwin MRN: [id number] DOB: 1951/02/19 Today's Date: 11/25/2019    History of Present Illness Pt is a 69 year old female presents 7/25 with dx of RLE cellulitis. 7/26 pt developed worsening delirium and desaturated requiring intubation 7/27, pt extubated 8/1. PMH obesity, chronic diastolic failure, ITP, chronic hypoxemic respiratory failure on 3 L nasal cannula at baseline secondary to interstitial lung disease. 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  History of COVID-19 in September 2020.    PT Comments    Pt progressing with overall activity tolerance, demonstrating increased gait distance (with seated rest breaks), repeated sit to stands throughout session, and LE exercises. Pt requires safety cuing during mobility, especially for room navigation with RW use, but also occasionally cues self I.e. "I need to step back to the recliner until I feel it on my legs before I sit". Pt motivated to maximize mobility, continuing to recommend CIR.    Follow Up Recommendations  CIR     Equipment Recommendations   (defer to post acute)    Recommendations for Other Services       Precautions / Restrictions Precautions Precautions: Fall Restrictions Weight Bearing Restrictions: No    Mobility  Bed Mobility Overal bed mobility: Needs Assistance Bed Mobility: Supine to Sit     Supine to sit: Supervision;HOB elevated     General bed mobility comments: for safety, HOB elevation and bedrails utilized.  Transfers Overall transfer level: Needs assistance Equipment used: Rolling walker (2 wheeled) Transfers: Sit to/from [facility] Sit to Stand: Min assist         General transfer comment: Min guard, with occasional min assist for steadying, for sit<>stand. Pt with poor eccentric control moving stand>sit, STS x3.  Ambulation/Gait Ambulation/Gait assistance: Min guard Gait Distance (Feet):  15 Feet (3x15) Assistive device: Rolling walker (2 wheeled) Gait Pattern/deviations: Step-through pattern;Decreased stride length;Shuffle;Trunk flexed Gait velocity: decr   General Gait Details: Min gaurd for safety, verbal cuing for upright posture, placement in RW, room navigation. Seated rest breaks every 15 ft x1 minute, with SpO2 88-89% after short gait bouts. Verbal cuing for breathing technique to recover sats.   Stairs             Wheelchair Mobility    Modified Rankin (Stroke Patients Only)       Balance Overall balance assessment: Needs assistance Sitting-balance support: No upper extremity supported;Feet supported Sitting balance-Leahy Scale: Fair Sitting balance - Comments: supervision for safety   Standing balance support: Bilateral upper extremity supported Standing balance-Leahy Scale: Poor Standing balance comment: reliant on external support                            Cognition Arousal/Alertness: Awake/alert Behavior During Therapy: Flat affect Overall Cognitive Status: Impaired/Different from baseline Area of Impairment: Memory;Safety/judgement;Awareness;Problem solving;Following commands                     Memory: Decreased short-term memory Following Commands: Follows one step commands with increased time;Follows one step commands consistently Safety/Judgement: Decreased awareness of safety;Decreased awareness of deficits Awareness: Intellectual Problem Solving: Slow processing;Requires verbal cues;Requires tactile cues General Comments: Pt demonstrating deficits in STM, pt stating "well are we going to get up?" after PT session and pt up in chair. Pt follows one-step commands during mobility, at times requires safety cuing for room navigation during gait.      Exercises General  Exercises - Lower Extremity Long Arc Quad: AROM;Both;10 reps;Seated Hip ABduction/ADduction: AROM;Both;10 reps;Seated    General Comments         Pertinent Vitals/Pain Pain Assessment: No/denies pain    Home Living                      Prior Function            PT Goals (current goals can now be found in the care plan section) Acute Rehab PT Goals Patient Stated Goal: to et stronger and return home PT Goal Formulation: With patient Time For Goal Achievement: 12/03/19 Potential to Achieve Goals: Good Progress towards PT goals: Progressing toward goals    Frequency    Min 3X/week      PT Plan Current plan remains appropriate    Co-evaluation              AM-PAC PT "6 Clicks" Mobility   Outcome Measure  Help needed turning from your back to your side while in a flat bed without using bedrails?: None Help needed moving from lying on your back to sitting on the side of a flat bed without using bedrails?: A Little Help needed moving to and from a bed to a chair (including a wheelchair)?: A Little Help needed standing up from a chair using your arms (e.g., wheelchair or bedside chair)?: A Little Help needed to walk in hospital room?: A Little Help needed climbing 3-5 steps with a railing? : A Lot 6 Click Score: 18    End of Session Equipment Utilized During Treatment: Oxygen Activity Tolerance: Patient limited by fatigue Patient left: with call bell/phone within reach;in chair;with chair alarm set Nurse Communication: Mobility status PT Visit Diagnosis: Other abnormalities of gait and mobility (R26.89);Muscle weakness (generalized) (M62.81)     Time: 6191-5502 PT Time Calculation (min) (ACUTE ONLY): 25 min  Charges:  $Gait Training: 8-22 mins $Therapeutic Activity: 8-22 mins                    Jerrald Doverspike E, PT Acute Rehabilitation Services Pager [phone]  Office [phone]    Jamal Pavon D Elonda Husky 11/25/2019, 12:38 PM

## 2019-11-25 NOTE — Plan of Care (Signed)
  Problem: Elimination: Goal: Will not experience complications related to urinary retention Outcome: Progressing   Problem: Safety: Goal: Ability to remain free from injury will improve Outcome: Progressing   Problem: Skin Integrity: Goal: Risk for impaired skin integrity will decrease Outcome: Progressing

## 2019-11-25 NOTE — Progress Notes (Signed)
Pt seen and examined, no changes from DC summary 8/9, sitting up in bed eating breakfast, in good spirits, anxiously waiting for Rehab  Domenic Polite, MD

## 2019-11-25 NOTE — Progress Notes (Signed)
Inpatient Rehab Admissions Coordinator:   Met with pt at bedside.  Still awaiting determination from insurance re CIR prior auth.  Pt verbalized understanding.  Will continue to follow.   Shann Medal, PT, DPT Admissions Coordinator [phone] 11/25/19  10:55 AM

## 2019-11-26 DIAGNOSIS — J9601 Acute respiratory failure with hypoxia: Secondary | ICD-10-CM | POA: Diagnosis not present

## 2019-11-26 DIAGNOSIS — J9611 Chronic respiratory failure with hypoxia: Secondary | ICD-10-CM | POA: Diagnosis not present

## 2019-11-26 DIAGNOSIS — L03115 Cellulitis of right lower limb: Secondary | ICD-10-CM | POA: Diagnosis not present

## 2019-11-26 DIAGNOSIS — I5032 Chronic diastolic (congestive) heart failure: Secondary | ICD-10-CM | POA: Diagnosis not present

## 2019-11-26 LAB — GLUCOSE, CAPILLARY
Glucose-Capillary: 114 mg/dL — ABNORMAL HIGH (ref 70–99)
Glucose-Capillary: 152 mg/dL — ABNORMAL HIGH (ref 70–99)
Glucose-Capillary: 173 mg/dL — ABNORMAL HIGH (ref 70–99)
Glucose-Capillary: 171 mg/dL — ABNORMAL HIGH (ref 70–99)

## 2019-11-26 MED ORDER — ALUM & MAG HYDROXIDE-SIMETH 200-200-20 MG/5ML PO SUSP
30.0000 mL | Freq: Four times a day (QID) | ORAL | Status: DC | PRN
  Administered 2019-11-26 – 2019-11-30 (×3): 30 mL via ORAL
  Filled 2019-11-26 (×3): qty 30

## 2019-11-26 NOTE — Progress Notes (Signed)
PROGRESS NOTE  Kathy Irwin WYO:[id number] DOB: 12/14/1950 DOA: 11/08/2019 PCP: Kerin Perna, NP   LOS: 17 days   Brief narrative: As per HPI,  69 year old female with obesity, chronic diastolic failure, ITP, chronic hypoxemic respiratory failure on 3 L nasal cannula at baseline secondary to interstitial lung disease followed by Dr. Vaughan Browner in the ILD clinic at pulmonary clinic, history of positive PPD in the 1980s treated with [place] for 1 year,History of COVID-19 in September 2020, patient was admitted to the hospital on 11/09/2019 with RLE cellulitis in spite of 10 day of Cephalexin associated with nausea and vomiting. The following day developed encephalopathy and hypercarbia, was transferred to the ICU and intubated, diuresed aggressively in the ICU subsequently developed acute renal failure. Patient was eventually extubated on 8/1. Her kidney function continued to worsen from normal range up to 3, started on IV fluids on 8/3. Patient was then transferred from [facility] to [facility] 8/4. Her kidney function and mental status improving, hospital course complicated by recurrent urinary retention requiring Foley catheter placement. Patient was seen by physical therapy and was recommended CIR placement.  Assessment/Plan:  Principal Problem:   Cellulitis of right lower leg Active Problems:   Insulin-requiring or dependent type II diabetes mellitus (HCC)   HTN (hypertension)   Acute respiratory failure (HCC)   Chronic diastolic CHF (congestive heart failure) (HCC)   Chronic respiratory failure with hypoxia (HCC)   Sepsis (HCC)  Acute hypercarbic respiratory failure with Chronic hypoxic respiratory failure/ILD Suspected OHS/OSA/ Acute on chronic diastolic CHF Sepsis -Patient was status post intubation on 11/10/2019 and was extubated on 11/16/2019 for acute on chronic respiratory failure.  Diuresis in the ICU subsequently resulted in renal failure which has improved at this time.  Patient has been weaned  to 2 to 3 L of oxygen and is currently at baseline.  Patient will need pulmonary follow-up as outpatient for sleep study and low-dose as needed Lasix at discharge.  At this time, patient is deconditioned and debilitated and has been seen by physical therapy who recommended CIR.  Hypokalemia.  Will replenish orally.  Check BMP in a.m.   Sepsis, right lower extremity cellulitis, present on admission. Failed outpatient treatment with 10-day course of Keflex.  Patient received IV vancomycin and Zosyn till 11/16/2019.  At this time, patient is clinically improved.  Has completed antibiotic course.  Cultures are negative.  Acute kidney injury From aggressive diuresis with mild component of hydronephrosis and sepsis. Baseline creatinine of 0.9, creatinine trended up to 3.1 with overdiuresis and sepsis.  Creatinine has improved at this time. Diuretics currently on hold.  Patient was initially on Foley catheter on 11/18/2019 which was removed on 11/20/2019.  She failed Foley catheter removal so was started on Flomax and Foley catheter was replaced on 11/21/2019. Voiding trial once more ambulatory at rehabilitation.   Metabolic encephalopathy Secondary to sepsis and hypercarbia.  Improving at this time.  Patient has been resumed on low-dose Lyrica.   History of ITP -Platelet count stable, normal   Type 2 diabetes mellitus Continue sliding scale insulin, Accu-Cheks diabetic diet.  Last POC glucose was 152.  Gallstones Likely incidental.  LFTs within normal limits.  No abdominal pain fever.  Could follow-up with general surgery as outpatient for elective cholecystectomy in the future.    Chronic respiratory failure, Interstitial lung disease -On 3 L home O2 at baseline, continue same   DVT prophylaxis: SCD's Start: 11/09/19 2329  Code Status: Full code  Family Communication: None  Status is:  Inpatient  Remains inpatient appropriate because:Need for rehabilitation/skilled nursing facility  placement.   Dispo: The patient is from: Home              Anticipated d/c is to: SNF.  I called [place] [facility] MD for a peer to peer review.  Patient has been declined for acute rehabilitation .  Updated Education officer, museum about it.  Will need updated PT notes for subacute rehab determination              Anticipated d/c date is: 2 days              Patient currently is medically stable to d/c.   Consultants: PCCM  Procedures:  Intubation 7/26  Extubation 8/1  Antibiotics:  . None  Anti-infectives (From admission, onward)   Start     Dose/Rate Route Frequency Ordered Stop   11/12/19 1800  vancomycin (VANCOREADY) IVPB 1500 mg/300 mL  Status:  Discontinued        1,500 mg 150 mL/hr over 120 Minutes Intravenous Every 12 hours 11/12/19 0648 11/12/19 0852   11/12/19 0700  vancomycin (VANCOREADY) IVPB 1500 mg/300 mL  Status:  Discontinued        1,500 mg 150 mL/hr over 120 Minutes Intravenous  Once 11/12/19 0648 11/12/19 0852   11/10/19 0600  ceFAZolin (ANCEF) IVPB 2g/100 mL premix  Status:  Discontinued        2 g 200 mL/hr over 30 Minutes Intravenous On call to O.R. 11/09/19 2328 11/10/19 1430   11/09/19 1700  vancomycin (VANCOREADY) IVPB 750 mg/150 mL  Status:  Discontinued        750 mg 150 mL/hr over 60 Minutes Intravenous Every 12 hours 11/09/19 0445 11/12/19 0647   11/09/19 1300  piperacillin-tazobactam (ZOSYN) IVPB 3.375 g        3.375 g 12.5 mL/hr over 240 Minutes Intravenous Every 8 hours 11/09/19 0520 11/17/19 0023   11/09/19 0445  piperacillin-tazobactam (ZOSYN) IVPB 3.375 g        3.375 g 100 mL/hr over 30 Minutes Intravenous  Once 11/09/19 0442 11/09/19 0559   11/09/19 0445  vancomycin (VANCOREADY) IVPB 2000 mg/400 mL        2,000 mg 200 mL/hr over 120 Minutes Intravenous  Once 11/09/19 0444 11/09/19 0859       Subjective: Today, patient was seen and examined at bedside.  Patient complains of mild epigastric discomfort and feels likely from her history of  GERD.  Denies any nausea or vomiting.  No fever, chills or rigor.  Denies increasing shortness of breath.  Objective: Vitals:   11/26/19 0621 11/26/19 0900  BP: (!) 144/73 140/65  Pulse: 80 82  Resp: 20 18  Temp: 98.9 F (37.2 C) 98.8 F (37.1 C)  SpO2: 93%     Intake/Output Summary (Last 24 hours) at 11/26/2019 1140 Last data filed at 11/26/2019 0601 Gross per 24 hour  Intake 0 ml  Output 1625 ml  Net -1625 ml   Filed Weights   11/25/19 0500 11/25/19 2131 11/26/19 0500  Weight: 117.3 kg 117.3 kg 117.3 kg   Body mass index is 45.81 kg/m.   Physical Exam: GENERAL: Patient is alert awake and oriented. Not in obvious distress.  On nasal cannula oxygen at 3 L/min, obese. HENT: No scleral pallor or icterus. Pupils equally reactive to light. Oral mucosa is moist NECK: is supple, no gross swelling noted. CHEST:  Diminished breath sounds bilaterally. CVS: S1 and S2 heard, no murmur. Regular rate and rhythm.  ABDOMEN: Soft, mild epigastric tenderness, bowel sounds are present. Foley catheter in place. EXTREMITIES: Right leg with mild discoloration from previous cellulitis. CNS: Cranial nerves are intact. No focal motor deficits. SKIN: warm and dry without rashes.  Data Review: I have personally reviewed the following laboratory data and studies,  CBC: Recent Labs  Lab 11/20/19 0404 11/21/19 0518 11/22/19 0350 11/23/19 0251  WBC 3.7* 6.5 5.6 5.3  HGB 10.5* 11.0* 10.8* 10.7*  HCT 34.0* 36.6 34.9* 34.7*  MCV 102.4* 104.0* 102.6* 101.8*  PLT 190 181 164 858*   Basic Metabolic Panel: Recent Labs  Lab 11/20/19 0837 11/20/19 1350 11/21/19 0518 11/21/19 1316 11/22/19 0350 11/23/19 0251 11/24/19 0215  NA  --    < > 149* 147* 144 145 140  K  --    < > 4.0 3.7 3.4* 3.6 3.4*  CL  --    < > 109 111 111 109 108  CO2  --    < > _0 GLUCOSE  --    < > 179* 165* 127* 132* 154*  BUN  --    < > _1 CREATININE  --    < > 0.85 0.84 0.74 0.72 0.73  CALCIUM   --    < > 10.2 10.3 9.8 9.7 9.3  MG 1.4*  --   --   --   --   --   --    < > = values in this interval not displayed.   Liver Function Tests: Recent Labs  Lab 11/20/19 0404 11/21/19 0518 11/22/19 0350 11/23/19 0251 11/24/19 0215  AST 14* 16 15 14* 14*  ALT _2 ALKPHOS 36* 43 41 46 44  BILITOT 0.9 0.9 0.8 0.5 0.4  PROT 6.1* 6.7 6.2* 6.4* 6.0*  ALBUMIN 3.0* 3.3* 3.2* 3.0* 3.0*   No results for input(s): LIPASE, AMYLASE in the last 168 hours. No results for input(s): AMMONIA in the last 168 hours. Cardiac Enzymes: No results for input(s): CKTOTAL, CKMB, CKMBINDEX, TROPONINI in the last 168 hours. BNP (last 3 results) Recent Labs    08/01/19 1429 10/24/19 1924 11/14/19 1036  BNP 93.4 38.0 112.3*    ProBNP (last 3 results) Recent Labs    03/03/19 1151  PROBNP 70.0    CBG: Recent Labs  Lab 11/25/19 1123 11/25/19 1619 11/25/19 2130 11/26/19 0712 11/26/19 1117  GLUCAP 144* 121* 173* 114* 152*   No results found for this or any previous visit (from the past 240 hour(s)).   Studies: No results found.    Flora Lipps, MD  [facility] 11/26/2019

## 2019-11-26 NOTE — Progress Notes (Signed)
Inpatient Rehab Admissions Coordinator:   Notified by pt's insurance that they would not approve CIR.  Dr. Louanne Belton attempted peer to peer and denial was upheld.  Will sign off for CIR at this time.  Pt/family and TOC team aware.   Shann Medal, PT, DPT Admissions Coordinator [phone] 11/26/19  12:17 PM

## 2019-11-26 NOTE — TOC Initial Note (Signed)
Transition of Care [facility]) - Initial/Assessment Note    Patient Details  Name: Kathy Irwin MRN: [id number] Date of Birth: 10/25/1950  [facility] CM/SW Contact:    Sable Feil, LCSW Phone Number: 11/26/2019, 5:38 PM  Clinical Narrative: CSW talked with daughter Kathy Irwin (1:45 pm) by phone regarding her mother's discharge disposition after denial of CIR by her insurance (initial denial and peer-to-peer denial). 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 When asked, CSW advised that her mother has never been to a facility for ST rehab.   CSW advised that her mother moved to [place] from [facility] in March of last year after her husband was placed in a SNF (LTC), a Airline pilot facility per [employer]. CSW explained facility search process and informed her that a copy of the Medicare.gov SNF list will be placed in patient's room. CSW was advised by Kathy Irwin that her sister Kathy Irwin was currently in the room. Daughter asked which sibling would be the primary contact person and she indicated that she would be that person (her phone number: [phone]). Kathy Irwin reported that she works from home.  When asked, daughter responded that her mom has not had the COVID vaccinations and does not want it and this was discussed in terms of quarantine for a certain number of days at most SNF's.  Tonya asked would outpatient rehab be an option if SNF placement denied by insurance. Daughter informed that CSW would have to check into this.           2:30 pm: Visited room and talked with patient, who was awake, alert, and oriented-in bed and daughter Kathy Irwin. CSW advised patient/daughter of my conversation regarding SNF placement (if insurance approves) and patient expressed agreement. Medicare.gov SNF list given to [facility], and she will give it to her sister Kathy Irwin. Patient did confirmed that she has not had the vaccine and due to her health issues, does not plan to take it and this  was discussed.  Ms. Benn was informed that she and her daughter Kathy Irwin will be kept updated regarding the facility search, and patient expressed understanding.         Expected Discharge Plan: [place] Barriers to Discharge: Ship broker, Other (comment) (Insurance denied CIR; daughter Kathy Irwin and Kathy Irwin, and patient agreeable to ST rehab)   Patient Goals and CMS Choice Patient states their goals for this hospitalization and ongoing recovery are:: Patient and daighters want her to get stronger so that she can return home safely CMS Medicare.gov Compare Post Acute Care list provided to:: Patient Represenative (must comment) (SNF list taken to room on 8/11 and given to daughter Kathy Irwin to give to her sister Kathy Irwin) Choice offered to / list presented to : Adult Children  Expected Discharge Plan and Services Expected Discharge Plan: [place] In-house Referral: Clinical Social Work [facility] consult)     Living arrangements for the past 2 months: [place]                                      Prior Living Arrangements/Services Living arrangements for the past 2 months: [place] with:: Self Patient language and need for interpreter reviewed:: No Do you feel safe going back to the place where you live?: No   Patient agreeable to [facility] rehab. Daughters Kathy Irwin and Kathy Irwin also agreeable  Need for Family Participation  in Patient Care: Yes (Comment) Care giver support system in place?: No (comment)   Criminal Activity/Legal Involvement Pertinent to Current Situation/Hospitalization: No - Comment as needed  Activities of Daily Living Home Assistive Devices/Equipment: None ADL Screening (condition at time of admission) Patient's cognitive ability adequate to safely complete daily activities?: No Is the patient deaf or have difficulty hearing?: No Does the patient have difficulty seeing, even when wearing glasses/contacts?: No Does  the patient have difficulty concentrating, remembering, or making decisions?: Yes Patient able to express need for assistance with ADLs?: Yes Does the patient have difficulty dressing or bathing?: Yes Independently performs ADLs?: No Communication: Independent Dressing (OT): Needs assistance Is this a change from baseline?: Change from baseline, expected to last >3 days Grooming: Needs assistance Is this a change from baseline?: Change from baseline, expected to last >3 days Feeding: Needs assistance Is this a change from baseline?: Change from baseline, expected to last >3 days Bathing: Needs assistance Is this a change from baseline?: Change from baseline, expected to last >3 days Toileting: Needs assistance Is this a change from baseline?: Change from baseline, expected to last >3days In/Out Bed: Needs assistance Is this a change from baseline?: Change from baseline, expected to last >3 days Walks in Home: Needs assistance Is this a change from baseline?: Change from baseline, expected to last >3 days Does the patient have difficulty walking or climbing stairs?: Yes Weakness of Legs: Both Weakness of Arms/Hands: Both  Permission Sought/Granted Permission sought to share information with : Family Supports Permission granted to share information with : Yes, Verbal Permission Granted  Share Information with NAME: Secretary/administrator     Permission granted to share info w Relationship: Daughter  Permission granted to share info w Contact Information: [phone]  Emotional Assessment Appearance:: Appears stated age Attitude/Demeanor/Rapport: Engaged Affect (typically observed): Appropriate Orientation: : Oriented to Self, Oriented to Place, Oriented to Situation Alcohol / Substance Use: Tobacco Use, Alcohol Use, Illicit Drugs (Per H&P, patient reported that she has never smoked and does not drink or use illicit drugs) Psych Involvement: No (comment)  Admission diagnosis:  Cellulitis  of right lower extremity [L03.115] Cellulitis of right lower leg [L03.115] Sepsis ([place]) [A41.9] Patient Active Problem List   Diagnosis Date Noted  . Cellulitis of right lower leg 11/09/2019  . Sepsis ([place]) 11/09/2019  . Chronic diastolic CHF (congestive heart failure) ([place]) 08/02/2019  . Chronic respiratory failure with hypoxia ([place]) 08/02/2019  . Acute on chronic respiratory failure with hypoxemia ([place]) 08/02/2019  . Atherosclerosis of aorta ([place]) 01/23/2019  . Interstitial lung disease ([place]) 12/24/2018  . Obesity, Class III, BMI 40-49.9 (morbid obesity) ([place]) 12/24/2018  . Pneumonia due to COVID-19 virus 11/15/2018  . Acute respiratory failure ([place]) 12/19/2017  . Hypersensitivity pneumonitis ([place]) 12/19/2017  . Thrombocytopenia ([place]) 12/19/2017  . Insulin-requiring or dependent type II diabetes mellitus ([place]) 12/17/2017  . HTN (hypertension) 12/17/2017  . Essential hypertension 11/05/2017  . 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  Readmission Risk Interventions Readmission Risk Prevention Plan 08/12/2019  Transportation Screening Complete  PCP or Specialist Appt within 3-5 Days Complete  HRI or Home Care Consult Complete  Social Work Consult for  Recovery Care Planning/Counseling Complete  Palliative Care Screening Not Applicable  Medication Review (RN Care Manager) Complete

## 2019-11-27 ENCOUNTER — Ambulatory Visit: Admit: 2019-11-27 | Payer: Medicare Other | Admitting: Surgery

## 2019-11-27 DIAGNOSIS — J9601 Acute respiratory failure with hypoxia: Secondary | ICD-10-CM | POA: Diagnosis not present

## 2019-11-27 DIAGNOSIS — J9611 Chronic respiratory failure with hypoxia: Secondary | ICD-10-CM | POA: Diagnosis not present

## 2019-11-27 DIAGNOSIS — L03115 Cellulitis of right lower limb: Secondary | ICD-10-CM | POA: Diagnosis not present

## 2019-11-27 DIAGNOSIS — I5032 Chronic diastolic (congestive) heart failure: Secondary | ICD-10-CM | POA: Diagnosis not present

## 2019-11-27 LAB — BASIC METABOLIC PANEL
Anion gap: 10 (ref 5–15)
BUN: 6 mg/dL — ABNORMAL LOW (ref 8–23)
CO2: 25 mmol/L (ref 22–32)
Calcium: 9.4 mg/dL (ref 8.9–10.3)
Chloride: 107 mmol/L (ref 98–111)
Creatinine, Ser: 0.71 mg/dL (ref 0.44–1.00)
GFR calc Af Amer: >60 mL/min (ref >60)
GFR calc non Af Amer: >60 mL/min (ref >60)
Glucose, Bld: 216 mg/dL — ABNORMAL HIGH (ref 70–99)
Potassium: 4.3 mmol/L (ref 3.5–5.1)
Sodium: 142 mmol/L (ref 135–145)

## 2019-11-27 LAB — CBC
HCT: 34.7 % — ABNORMAL LOW (ref 36.0–46.0)
Hemoglobin: 10.8 g/dL — ABNORMAL LOW (ref 12.0–15.0)
MCH: 30.9 pg (ref 26.0–34.0)
MCHC: 31.1 g/dL (ref 30.0–36.0)
MCV: 99.4 fL (ref 80.0–100.0)
Platelets: 126 10*3/uL — ABNORMAL LOW (ref 150–400)
RBC: 3.49 MIL/uL — ABNORMAL LOW (ref 3.87–5.11)
RDW: 17.2 % — ABNORMAL HIGH (ref 11.5–15.5)
WBC: 9 10*3/uL (ref 4.0–10.5)
nRBC: 0.4 % — ABNORMAL HIGH (ref 0.0–0.2)

## 2019-11-27 LAB — GLUCOSE, CAPILLARY
Glucose-Capillary: 121 mg/dL — ABNORMAL HIGH (ref 70–99)
Glucose-Capillary: 183 mg/dL — ABNORMAL HIGH (ref 70–99)
Glucose-Capillary: 152 mg/dL — ABNORMAL HIGH (ref 70–99)
Glucose-Capillary: 156 mg/dL — ABNORMAL HIGH (ref 70–99)

## 2019-11-27 SURGERY — THYROIDECTOMY
Anesthesia: General

## 2019-11-27 MED ORDER — PANTOPRAZOLE SODIUM 40 MG PO TBEC
40.0000 mg | DELAYED_RELEASE_TABLET | Freq: Two times a day (BID) | ORAL | Status: DC
  Administered 2019-11-27 – 2019-12-01 (×7): 40 mg via ORAL
  Filled 2019-11-27 (×8): qty 1

## 2019-11-27 MED ORDER — SIMETHICONE 80 MG PO CHEW: 80.0000 mg | CHEWABLE_TABLET | Freq: Four times a day (QID) | ORAL | Status: DC | PRN

## 2019-11-27 NOTE — Progress Notes (Signed)
Patient returned to bed. Foley catheter removed. Tolerated well. Will continue to monitor. Dorthey Sawyer, RN

## 2019-11-27 NOTE — Progress Notes (Signed)
PROGRESS NOTE  Kathy Irwin ZOX:[id number] DOB: October 23, 1950 DOA: 11/08/2019 PCP: Kerin Perna, NP   LOS: 18 days   Brief narrative: As per HPI,  69 year old female with obesity, chronic diastolic failure, ITP, chronic hypoxemic respiratory failure on 3 L nasal cannula at baseline secondary to interstitial lung disease followed by Dr. 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 The following day, patient developed encephalopathy and hypercarbia, was transferred to the ICU and intubated, diuresed aggressively in the ICU subsequently developed acute renal failure. Patient was eventually extubated on 8/1. Her kidney function continued to worsen from normal range up to 3, started on IV fluids on 8/3. Patient was then transferred from [facility] to [facility] 8/4. Her kidney function and mental status improving, hospital course complicated by recurrent urinary retention requiring foley catheter placement. Patient was seen by physical therapy and was recommended CIR placement.  Assessment/Plan:  Principal Problem:   Cellulitis of right lower leg Active Problems:   Insulin-requiring or dependent type II diabetes mellitus (HCC)   HTN (hypertension)   Acute respiratory failure (HCC)   Chronic diastolic CHF (congestive heart failure) (HCC)   Chronic respiratory failure with hypoxia (HCC)   Sepsis (HCC)  Acute hypercarbic respiratory failure with Chronic hypoxic respiratory failure/ILD Suspected OHS/OSA/ Acute on chronic diastolic CHF Sepsis  Patient was status post intubation on 11/10/2019 and was extubated on 11/16/2019 for acute on chronic respiratory failure.  Diuresis in the ICU subsequently resulted in renal failure which has improved at this time.  Patient has  been weaned to 2 to 3 L of oxygen and is currently at baseline.  Patient will need pulmonary follow-up as outpatient for sleep study and low-dose as needed Lasix at discharge.  At this time, patient is deconditioned and debilitated and has been seen by physical therapy who recommended CIR. Insurance has denied CIR.  Will likely need subacute rehab.  Transition of care on board.  Hypokalemia.  Replenished orally.  Potassium of 4.3 today.   Sepsis, right lower extremity cellulitis, present on admission. Failed outpatient treatment with 10-day course of Keflex.  Patient received IV vancomycin and Zosyn till 11/16/2019.  At this time, patient has clinically improved and has completed antibiotic course.  Blood cultures and respiratory cultures are negative.  Acute kidney injury From aggressive diuresis with mild component of hydronephrosis and sepsis. Baseline creatinine of 0.9, creatinine trended up to 3.1 with overdiuresis and sepsis.    Patient was initially on Foley catheter on 11/18/2019 which was removed on 11/20/2019.  She failed Foley catheter removal so was started on Flomax and Foley catheter was replaced on 11/21/2019. Voiding trial today.  Check BMP daily.  Creatinine of 0.7 at this time.  Metabolic encephalopathy Secondary to sepsis and hypercarbia.  Improving at this time.  Patient has been resumed on low-dose Lyrica.   History of ITP -Platelet count stable.  Latest platelet count of 126   Type 2 diabetes mellitus Continue sliding scale insulin, Accu-Cheks diabetic diet.  Last POC glucose was 121.  Gallstones Likely incidental.  LFTs within normal limits.  No abdominal pain fever.  Could follow-up with general surgery as outpatient for elective cholecystectomy in the future.    Chronic respiratory failure, Interstitial lung disease -On 3 L home O2 at  baseline  DVT prophylaxis: SCD's Start: 11/09/19 2329  Code Status: Full code  Family Communication: None  Status is:  Inpatient  Remains inpatient appropriate because:Need for rehabilitation/skilled nursing facility placement.   Dispo: The patient is from: Home              Anticipated d/c is to: SNF.                Anticipated d/c date is: 2 days              Patient currently is medically stable to d/c.  Consultants: PCCM  Procedures:  Intubation 7/26  Extubation 8/1  Antibiotics:  . None  Anti-infectives (From admission, onward)   Start     Dose/Rate Route Frequency Ordered Stop   11/12/19 1800  vancomycin (VANCOREADY) IVPB 1500 mg/300 mL  Status:  Discontinued        1,500 mg 150 mL/hr over 120 Minutes Intravenous Every 12 hours 11/12/19 0648 11/12/19 0852   11/12/19 0700  vancomycin (VANCOREADY) IVPB 1500 mg/300 mL  Status:  Discontinued        1,500 mg 150 mL/hr over 120 Minutes Intravenous  Once 11/12/19 0648 11/12/19 0852   11/10/19 0600  ceFAZolin (ANCEF) IVPB 2g/100 mL premix  Status:  Discontinued        2 g 200 mL/hr over 30 Minutes Intravenous On call to O.R. 11/09/19 2328 11/10/19 1430   11/09/19 1700  vancomycin (VANCOREADY) IVPB 750 mg/150 mL  Status:  Discontinued        750 mg 150 mL/hr over 60 Minutes Intravenous Every 12 hours 11/09/19 0445 11/12/19 0647   11/09/19 1300  piperacillin-tazobactam (ZOSYN) IVPB 3.375 g        3.375 g 12.5 mL/hr over 240 Minutes Intravenous Every 8 hours 11/09/19 0520 11/17/19 0023   11/09/19 0445  piperacillin-tazobactam (ZOSYN) IVPB 3.375 g        3.375 g 100 mL/hr over 30 Minutes Intravenous  Once 11/09/19 0442 11/09/19 0559   11/09/19 0445  vancomycin (VANCOREADY) IVPB 2000 mg/400 mL        2,000 mg 200 mL/hr over 120 Minutes Intravenous  Once 11/09/19 0444 11/09/19 0859     Subjective: Today, patient was seen and examined at bedside.  Patient denies any nausea, vomiting or abdominal pain today.  Denies overt shortness of breath, dyspnea.   Objective: Vitals:   11/26/19 2113 11/27/19 0530  BP: 123/76 138/62  Pulse: 88 76   Resp: 20 20  Temp: 98.6 F (37 C) 97.8 F (36.6 C)  SpO2: 93% 99%    Intake/Output Summary (Last 24 hours) at 11/27/2019 0732 Last data filed at 11/27/2019 0601 Gross per 24 hour  Intake 840 ml  Output 3600 ml  Net -2760 ml   Filed Weights   11/25/19 0500 11/25/19 2131 11/26/19 0500  Weight: 117.3 kg 117.3 kg 117.3 kg   Body mass index is 45.81 kg/m.   Physical Exam:  GENERAL: Patient is alert awake and oriented. Not in obvious distress.  On nasal cannula oxygen at 3 L/min, obese. HENT: No scleral pallor or icterus. Pupils equally reactive to light. Oral mucosa is moist NECK: is supple, no gross swelling noted. CHEST:  Diminished breath sounds bilaterally.  No obvious wheezes or crackles noted CVS: S1 and S2 heard, no murmur. Regular rate and rhythm.  ABDOMEN: Soft, mild  bowel sounds are present. Foley catheter in place. EXTREMITIES: Right leg with mild discoloration from previous cellulitis. CNS: Cranial nerves are  intact. No focal motor deficits. SKIN: warm and dry without rashes.  Data Review: I have personally reviewed the following laboratory data and studies,  CBC: Recent Labs  Lab 11/21/19 0518 11/22/19 0350 11/23/19 0251  WBC 6.5 5.6 5.3  HGB 11.0* 10.8* 10.7*  HCT 36.6 34.9* 34.7*  MCV 104.0* 102.6* 101.8*  PLT 181 164 277*   Basic Metabolic Panel: Recent Labs  Lab 11/20/19 0837 11/20/19 1350 11/21/19 0518 11/21/19 1316 11/22/19 0350 11/23/19 0251 11/24/19 0215  NA  --    < > 149* 147* 144 145 140  K  --    < > 4.0 3.7 3.4* 3.6 3.4*  CL  --    < > 109 111 111 109 108  CO2  --    < > _0 GLUCOSE  --    < > 179* 165* 127* 132* 154*  BUN  --    < > _1 CREATININE  --    < > 0.85 0.84 0.74 0.72 0.73  CALCIUM  --    < > 10.2 10.3 9.8 9.7 9.3  MG 1.4*  --   --   --   --   --   --    < > = values in this interval not displayed.   Liver Function Tests: Recent Labs  Lab 11/21/19 0518 11/22/19 0350 11/23/19 0251  11/24/19 0215  AST 16 15 14* 14*  ALT _2 ALKPHOS 43 41 46 44  BILITOT 0.9 0.8 0.5 0.4  PROT 6.7 6.2* 6.4* 6.0*  ALBUMIN 3.3* 3.2* 3.0* 3.0*   No results for input(s): LIPASE, AMYLASE in the last 168 hours. No results for input(s): AMMONIA in the last 168 hours. Cardiac Enzymes: No results for input(s): CKTOTAL, CKMB, CKMBINDEX, TROPONINI in the last 168 hours. BNP (last 3 results) Recent Labs    08/01/19 1429 10/24/19 1924 11/14/19 1036  BNP 93.4 38.0 112.3*    ProBNP (last 3 results) Recent Labs    03/03/19 1151  PROBNP 70.0    CBG: Recent Labs  Lab 11/26/19 0712 11/26/19 1117 11/26/19 1605 11/26/19 2118 11/27/19 0637  GLUCAP 114* 152* 173* 171* 121*   No results found for this or any previous visit (from the past 240 hour(s)).   Studies: No results found.    Flora Lipps, MD  [facility] 11/27/2019

## 2019-11-27 NOTE — TOC Progression Note (Addendum)
Transition of Care [facility]) - Progression Note    Patient Details  Name: Kathy Irwin MRN: [id number] Date of Birth: 04-Oct-1950  Transition of Care [facility]) CM/SW Contact  Sharlet Salina Mila Homer, LCSW Phone Number: 11/27/2019, 1:40 PM  Clinical Narrative: Patient/family requesting SNF placement. FL-2 completed, submitted for and received PASRR, and faxed out to facilities in [facility]. Contacted [facility] and they do manage patient. Reference number is [id number]. Clinicals faxed to [facility] with facility choice pending.   CSW talked with patient at the bedside and daughter [name] by phone regarding facility selection and [facility] chosen. Call made to [facility] (3:43 pm) and facility choice provided. CSW will continue to follow and provide SW intervention services as needed through discharge.     Expected Discharge Plan: [place] Barriers to Discharge: Ship broker, Other (comment) (Insurance denied CIR; daughter [name] and Lexine Baton, and patient agreeable to ST rehab)  Expected Discharge Plan and Services Expected Discharge Plan: [place] In-house Referral: Clinical Social Work [facility] consult)     Living arrangements for the past 2 months: Single Family Home                                     Social Determinants of Health (SDOH) Interventions  No SDOH interventions requested at this time   Readmission Risk Interventions Readmission Risk Prevention Plan 08/12/2019  Transportation Screening Complete  PCP or Specialist Appt within 3-5 Days Complete  HRI or [place] Complete  Social Work Consult for [place] Planning/Counseling Complete  Palliative Care Screening Not Applicable  Medication Review Press photographer) Complete

## 2019-11-27 NOTE — NC FL2 (Signed)
[place] LEVEL OF CARE SCREENING TOOL     IDENTIFICATION  Patient Name: Kathy Irwin Birthdate: Jan 23, 1951 Sex: female Admission Date (Current Location): 11/08/2019  [facility] and [place] Number:  Herbalist and Address:  [facility] [place], [place] [address] [place], [place] [address]      Provider Number: [id number]  Attending Physician Name and Address:  Flora Lipps, MD  Relative Name and Phone Number:  Mackey Birchwood - daughterr; [phone]    Current Level of Care: Hospital Recommended Level of Care: [place] Prior Approval Number:    Date Approved/Denied:   PASRR Number: [id number] (Eff. 11/27/19)  Discharge Plan: SNF    Current Diagnoses: Patient Active Problem List   Diagnosis Date Noted  . Cellulitis of right lower leg 11/09/2019  . Sepsis ([place]) 11/09/2019  . Chronic diastolic CHF (congestive heart failure) ([place]) 08/02/2019  . Chronic respiratory failure with hypoxia ([place]) 08/02/2019  . Acute on chronic respiratory failure with hypoxemia ([place]) 08/02/2019  . Atherosclerosis of aorta ([place]) 01/23/2019  . Interstitial lung disease ([place]) 12/24/2018  . Obesity, Class III, BMI 40-49.9 (morbid obesity) ([place]) 12/24/2018  . Pneumonia due to COVID-19 virus 11/15/2018  . Acute respiratory failure ([place]) 12/19/2017  . Hypersensitivity pneumonitis ([place]) 12/19/2017  . Thrombocytopenia ([place]) 12/19/2017  . Insulin-requiring or dependent type II diabetes mellitus ([place]) 12/17/2017  . HTN (hypertension) 12/17/2017  . Essential hypertension 11/05/2017  . 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[place]) 11/05/2017    Orientation RESPIRATION BLADDER Height & Weight     Self, Situation, Place  Normal Incontinent, External catheter (catheter placed 8/6) Weight: 258 lb 9.6 oz (117.3 kg) Height:  _0  (160 cm)  BEHAVIORAL SYMPTOMS/MOOD NEUROLOGICAL BOWEL NUTRITION STATUS      Continent Diet (Carb modified)  AMBULATORY STATUS COMMUNICATION  OF NEEDS Skin   Supervision (Min guard) Verbally Other (Comment) (Cellulitis of right lower leg)                       Personal Care Assistance Level of Assistance  Bathing, Feeding, Dressing Bathing Assistance: Maximum assistance (Upper body min assist; Lower body mod assist) Feeding assistance: Limited assistance (Assistance with set-up) Dressing Assistance: Maximum assistance (Lower body mod assist; uppor bodyt min assist)     Functional Limitations Info  Sight, Hearing, Speech Sight Info: Adequate Hearing Info: Adequate Speech Info: Adequate    SPECIAL CARE FACTORS FREQUENCY  PT (By licensed PT), OT (By licensed OT), Speech therapy     PT Frequency: Evaluated 8/4. PT at SNF eval and treat, a minimum of 5 days per week OT Frequency: Evaluated 8/3. OT at SNF eval and treat, a minimum of 5 days per week     Speech Therapy Frequency: Swallow eval 7/26.      Contractures Contractures Info: Not present    Additional Factors Info  Code Status, Allergies, Insulin Sliding Scale Code Status Info: Full Allergies Info: Iodine, Merbromiin, Tape   Insulin Sliding Scale Info: 0-5 Units daily at bedtime; 0-15 Units 3 times per day with meals       Current Medications (11/27/2019):  This is the current hospital active medication list Current Facility-Administered Medications  Medication Dose Route Frequency Provider Last Rate Last Admin  . acetaminophen (TYLENOL) tablet 650 mg  650 mg Oral Q6H PRN Etta Quill, DO   650 mg at 11/26/19 1048   Or  . acetaminophen (TYLENOL) suppository 650 mg  650 mg Rectal Q6H PRN Alcario Drought,  Toy Care, DO   650 mg at 11/10/19 1950  . albuterol (PROVENTIL) (2.5 MG/3ML) 0.083% nebulizer solution 3 mL  3 mL Inhalation Q6H PRN Etta Quill, DO      . alum & mag hydroxide-simeth (MAALOX/MYLANTA) 200-200-20 MG/5ML suspension 30 mL  30 mL Oral Q6H PRN Pokhrel, Laxman, MD   30 mL at 11/26/19 1812  . chlorhexidine (PERIDEX) 0.12 % solution 15 mL  15 mL  Mouth Rinse BID Collene Gobble, MD   15 mL at 11/27/19 1114  . Chlorhexidine Gluconate Cloth 2 % PADS 6 each  6 each Topical Daily Brand Males, MD   6 each at 11/27/19 1124  . enoxaparin (LOVENOX) injection 60 mg  60 mg Subcutaneous Q24H Domenic Polite, MD   60 mg at 11/27/19 1115  . famotidine (PEPCID) tablet 40 mg  40 mg Oral Daily Collene Gobble, MD   40 mg at 11/27/19 1114  . hydrALAZINE (APRESOLINE) injection 10 mg  10 mg Intravenous Q4H PRN Domenic Polite, MD      . insulin aspart (novoLOG) injection 0-15 Units  0-15 Units Subcutaneous TID WC Shela Leff, MD   3 Units at 11/27/19 1226  . insulin aspart (novoLOG) injection 0-5 Units  0-5 Units Subcutaneous QHS Shela Leff, MD      . MEDLINE mouth rinse  15 mL Mouth Rinse q12n4p Collene Gobble, MD   15 mL at 11/27/19 1229  . menthol-cetylpyridinium (CEPACOL) lozenge 3 mg  1 lozenge Oral PRN Domenic Polite, MD   3 mg at 11/23/19 1802  . metoprolol succinate (TOPROL-XL) 24 hr tablet 75 mg  75 mg Oral Daily Domenic Polite, MD   75 mg at 11/27/19 1114  . multivitamin with minerals tablet 1 tablet  1 tablet Oral Daily Collene Gobble, MD   1 tablet at 11/26/19 1055  . olopatadine (PATANOL) 0.1 % ophthalmic solution 1 drop  1 drop Both Eyes BID PRN Etta Quill, DO      . ondansetron [facility]) injection 4 mg  4 mg Intravenous Q6H PRN Collene Gobble, MD   4 mg at 11/26/19 1044  . pantoprazole (PROTONIX) EC tablet 40 mg  40 mg Oral Q1200 Domenic Polite, MD   40 mg at 11/27/19 1228  . polyethylene glycol (MIRALAX / GLYCOLAX) packet 17 g  17 g Oral Daily PRN Shela Leff, MD      . pravastatin (PRAVACHOL) tablet 20 mg  20 mg Oral q1800 Domenic Polite, MD   20 mg at 11/26/19 1801  . pregabalin (LYRICA) capsule 50 mg  50 mg Oral QPM Domenic Polite, MD   50 mg at 11/26/19 1759  . protein supplement (ENSURE MAX) liquid  11 oz Oral BID Domenic Polite, MD   11 oz at 11/26/19 2135  . senna-docusate (Senokot-S) tablet 1  tablet  1 tablet Oral BID Domenic Polite, MD   1 tablet at 11/27/19 1114  . sertraline (ZOLOFT) tablet 100 mg  100 mg Oral QHS Collene Gobble, MD   100 mg at 11/26/19 2135  . sodium chloride flush (NS) 0.9 % injection 3 mL  3 mL Intravenous Once Ripley Fraise, MD      . tamsulosin [facility]) capsule 0.4 mg  0.4 mg Oral Daily Domenic Polite, MD   0.4 mg at 11/27/19 1115     Discharge Medications: Please see discharge summary for a list of discharge medications.  Relevant Imaging Results:  Relevant Lab Results:   Additional Information [phone]  Sharlet Salina,  Mila Homer, LCSW

## 2019-11-27 NOTE — Progress Notes (Signed)
Physical Therapy Treatment Patient Details Name: Kathy Irwin MRN: [id number] DOB: [birth year] Today's Date: 11/27/2019    History of Present Illness Pt is a 69 year old female presents 7/25 with dx of RLE cellulitis. 7/26 pt developed worsening delirium and desaturated requiring intubation 7/27, pt extubated 8/1. PMH obesity, chronic diastolic failure, ITP, chronic hypoxemic respiratory failure on 3 L nasal cannula at baseline secondary to interstitial lung disease. 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  History of COVID-19 in September 2020.    PT Comments    Pt in bed upon arrival of PT, agreeable to session with focus on progression of functional mobility and discussion of new d/c plan following denial by CIR. The pt was able to progress ambulation distance within this session, but continues to demo limitations in functional strength for controlled movements, safety awareness, and endurance. The pt's SpO2 was stable on RA at rest, but dropped to low 80s with mobility and took 3 min to recover to 90s with seated rest following mobility on RA and 1.5 min to recover on 3L O2. The pt voiced a preference for d/c to rehab due to lack of family or friends to provide supervision or assist at home and desire to receive a larger volume of therapy than is possible in outpatient or HHPT setting. I agree with her decision as I do not think she is safe to return home without 24/7 supervision and the pt stated she would not be able to arrange this with her family.    Follow Up Recommendations  SNF;Supervision/Assistance - 24 hour     Equipment Recommendations   (defer to post acute)    Recommendations for Other Services       Precautions / Restrictions Precautions Precautions: Fall Precaution Comments: foley, watch SpO2 with mobility Restrictions Weight Bearing Restrictions: No    Mobility  Bed Mobility Overal bed mobility: Needs Assistance Bed Mobility: Supine to  Sit     Supine to sit: Supervision;HOB elevated     General bed mobility comments: for safety, HOB elevation and bedrails utilized.  Transfers Overall transfer level: Needs assistance Equipment used: Rolling walker (2 wheeled) Transfers: Sit to/from [facility] Sit to Stand: Min guard Stand pivot transfers: Min guard       General transfer comment: pt given minG for safety, assist with lines (O2). cues for hand positioning as pt with increasingly poor eccentric lower to chair when fatigued. x4  Ambulation/Gait Ambulation/Gait assistance: Min guard Gait Distance (Feet): 20 Feet (x 2 ; 48f) Assistive device: Rolling walker (2 wheeled) Gait Pattern/deviations: Step-through pattern;Decreased stride length;Shuffle;Trunk flexed Gait velocity: decr Gait velocity interpretation: <1.31 ft/sec, indicative of household ambulator General Gait Details: minG for safety and line management, cues for posture and positioning. seated rest break after 20 ft without O2 and SpO2 was 80%, increased to 90% after 3 min of guided breathing. then after second bout, SpO2 was 82% and increased to 96% in 1.5 minutes with 3L O2. The pt denies SOB throughout         Balance Overall balance assessment: Needs assistance Sitting-balance support: No upper extremity supported;Feet supported Sitting balance-Leahy Scale: Fair Sitting balance - Comments: supervision for safety   Standing balance support: Bilateral upper extremity supported Standing balance-Leahy Scale: Poor Standing balance comment: reliant on external support                            Cognition Arousal/Alertness:  Awake/alert Behavior During Therapy: Flat affect Overall Cognitive Status: Impaired/Different from baseline Area of Impairment: Memory;Safety/judgement;Awareness;Problem solving;Following commands                     Memory: Decreased short-term memory Following Commands: Follows one step  commands with increased time;Follows one step commands consistently Safety/Judgement: Decreased awareness of safety;Decreased awareness of deficits Awareness: Intellectual Problem Solving: Slow processing;Requires verbal cues;Requires tactile cues General Comments: Pt with good adherence to 1-step commands, but did need questions to be repeated a few times during the session as the pt showed no response to the question asked. cues for safety and line management through sesion      Exercises      General Comments General comments (skin integrity, edema, etc.): SpO2 96-100% at rest on RA ([place] not in pt's nose upon arrival of PT), down 80% after 20 ft ambulation, and took 3 min to recover to 90s. Pt then placed on 3L O2 to recover following second bout of walking and SpO2 dropped to 82% and recovered to 96% in 1.5 min. Pt then ambulated on 3L O2 and SpO2 was maintained at 88%.      Pertinent Vitals/Pain Pain Assessment: No/denies pain           PT Goals (current goals can now be found in the care plan section) Acute Rehab PT Goals Patient Stated Goal: to get stronger and return home PT Goal Formulation: With patient Time For Goal Achievement: 12/03/19 Potential to Achieve Goals: Good Progress towards PT goals: Progressing toward goals    Frequency    Min 3X/week      PT Plan Current plan remains appropriate       AM-PAC PT "6 Clicks" Mobility   Outcome Measure  Help needed turning from your back to your side while in a flat bed without using bedrails?: None Help needed moving from lying on your back to sitting on the side of a flat bed without using bedrails?: A Little Help needed moving to and from a bed to a chair (including a wheelchair)?: A Little Help needed standing up from a chair using your arms (e.g., wheelchair or bedside chair)?: A Little Help needed to walk in hospital room?: A Little Help needed climbing 3-5 steps with a railing? : A Lot 6 Click Score: 18     End of Session Equipment Utilized During Treatment: Oxygen;Gait belt Activity Tolerance: Patient limited by fatigue Patient left: with call bell/phone within reach;in chair;with chair alarm set Nurse Communication: Mobility status PT Visit Diagnosis: Other abnormalities of gait and mobility (R26.89);Muscle weakness (generalized) (M62.81)     Time: 5834-6219 PT Time Calculation (min) (ACUTE ONLY): 35 min  Charges:  $Gait Training: 23-37 mins                     Karma Ganja, PT, DPT   Acute Rehabilitation Department Pager #: [phone]   Otho Bellows 11/27/2019, 10:18 AM

## 2019-11-28 DIAGNOSIS — I5032 Chronic diastolic (congestive) heart failure: Secondary | ICD-10-CM | POA: Diagnosis not present

## 2019-11-28 DIAGNOSIS — J9611 Chronic respiratory failure with hypoxia: Secondary | ICD-10-CM | POA: Diagnosis not present

## 2019-11-28 DIAGNOSIS — J9601 Acute respiratory failure with hypoxia: Secondary | ICD-10-CM | POA: Diagnosis not present

## 2019-11-28 DIAGNOSIS — L03115 Cellulitis of right lower limb: Secondary | ICD-10-CM | POA: Diagnosis not present

## 2019-11-28 LAB — HEPATIC FUNCTION PANEL
ALT: 16 U/L (ref 0–44)
AST: 18 U/L (ref 15–41)
Albumin: 3.2 g/dL — ABNORMAL LOW (ref 3.5–5.0)
Alkaline Phosphatase: 49 U/L (ref 38–126)
Bilirubin, Direct: 0.1 mg/dL (ref 0.0–0.2)
Indirect Bilirubin: 0.6 mg/dL (ref 0.3–0.9)
Total Bilirubin: 0.7 mg/dL (ref 0.3–1.2)
Total Protein: 6.2 g/dL — ABNORMAL LOW (ref 6.5–8.1)

## 2019-11-28 LAB — BASIC METABOLIC PANEL
Anion gap: 8 (ref 5–15)
BUN: 8 mg/dL (ref 8–23)
CO2: 27 mmol/L (ref 22–32)
Calcium: 9.3 mg/dL (ref 8.9–10.3)
Chloride: 107 mmol/L (ref 98–111)
Creatinine, Ser: 0.69 mg/dL (ref 0.44–1.00)
GFR calc Af Amer: >60 mL/min (ref >60)
GFR calc non Af Amer: >60 mL/min (ref >60)
Glucose, Bld: 146 mg/dL — ABNORMAL HIGH (ref 70–99)
Potassium: 3.8 mmol/L (ref 3.5–5.1)
Sodium: 142 mmol/L (ref 135–145)

## 2019-11-28 LAB — CBC
HCT: 33.8 % — ABNORMAL LOW (ref 36.0–46.0)
Hemoglobin: 10.4 g/dL — ABNORMAL LOW (ref 12.0–15.0)
MCH: 30.5 pg (ref 26.0–34.0)
MCHC: 30.8 g/dL (ref 30.0–36.0)
MCV: 99.1 fL (ref 80.0–100.0)
Platelets: 108 10*3/uL — ABNORMAL LOW (ref 150–400)
RBC: 3.41 MIL/uL — ABNORMAL LOW (ref 3.87–5.11)
RDW: 17.2 % — ABNORMAL HIGH (ref 11.5–15.5)
WBC: 11.5 10*3/uL — ABNORMAL HIGH (ref 4.0–10.5)
nRBC: 0.5 % — ABNORMAL HIGH (ref 0.0–0.2)

## 2019-11-28 LAB — GLUCOSE, CAPILLARY
Glucose-Capillary: 135 mg/dL — ABNORMAL HIGH (ref 70–99)
Glucose-Capillary: 191 mg/dL — ABNORMAL HIGH (ref 70–99)
Glucose-Capillary: 104 mg/dL — ABNORMAL HIGH (ref 70–99)
Glucose-Capillary: 101 mg/dL — ABNORMAL HIGH (ref 70–99)

## 2019-11-28 LAB — LIPASE, BLOOD: Lipase: 32 U/L (ref 11–51)

## 2019-11-28 NOTE — Progress Notes (Signed)
Patient complains of urinary urgency. No output after attempting to void. Bladder scan performed (560 mL). Paged on-call provider. In and out cath was ordered. Total output was (725 mL). Urine was yellow and cloudy. Thick discharge occluded the cath. Patient states need to still urinate. She attempts to use the bathroom. No output. Patient tolerated well. Will continue to monitor.

## 2019-11-28 NOTE — Progress Notes (Signed)
Occupational Therapy Treatment Patient Details Name: Kathy Irwin MRN: [id number] DOB: 15-Mar-1951 Today's Date: 11/28/2019    History of present illness Pt is a 69 year old female presents 7/25 with dx of RLE cellulitis. 7/26 pt developed worsening delirium and desaturated requiring intubation 7/27, pt extubated 8/1. PMH obesity, chronic diastolic failure, ITP, chronic hypoxemic respiratory failure on 3 L nasal cannula at baseline secondary to interstitial lung disease. 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  History of COVID-19 in September 2020.   OT comments  Pt. Seen for skilled OT treatment session.  Able to complete LB bathing, toileting task, and grooming task with min guard a. Will benefit from continued therapy. Cognition stand point still presenting with decreased short term recall requiring multiple cues and answers to same questions multiple times during session.    Follow Up Recommendations  SNF;Supervision/Assistance - 24 hour    Equipment Recommendations       Recommendations for Other Services      Precautions / Restrictions Precautions Precautions: Fall Precaution Comments: watch SpO2 with mobility       Mobility Bed Mobility               General bed mobility comments: in recliner at beginning of session then able to lay in bed and get out of bed when finished with her wash up  Transfers   Equipment used: Rolling walker (2 wheeled) Transfers: Sit to/from [facility] Sit to Stand: Min guard Stand pivot transfers: Min guard            Balance                                           ADL either performed or assessed with clinical judgement   ADL Overall ADL's : Needs assistance/impaired     Grooming: Set up;Sitting Grooming Details (indicate cue type and reason): rinse mouth with mouthrinse as pt. was npo     Lower Body Bathing: Set up;Bed level Lower Body Bathing Details  (indicate cue type and reason): pt. states at home she has a pale she places beside the bed then she lays in bed on her back with legs spread to perform LB bathing including peri areas.  pt. propped on L elbow and opened legs to perform bathing as she does at home         Toilet Transfer: Min guard;Grab bars;BSC;Regular Toilet;RW;Ambulation [employer] forces technical [employer] Details (indicate cue type and reason): cues for rw management and hand placement on arm rests of bsc over the toilet before sitting down Toileting- Clothing Manipulation and Hygiene: Min guard;Sitting/lateral lean;Sit to/from stand       Functional mobility during ADLs: Min guard General ADL Comments: slight improvment with task initiation and completion with out noted confusion but cont. to repeat statements/questions after they had been answered     Vision       Perception     Praxis      Cognition Arousal/Alertness: Awake/alert Behavior During Therapy: Flat affect Overall Cognitive Status: Impaired/Different from baseline Area of Impairment: Memory;Safety/judgement;Awareness;Problem solving;Following commands                   Current Attention Level: Selective Memory: Decreased short-term memory Following Commands: Follows one step commands with increased time;Follows one step commands consistently Safety/Judgement: Decreased awareness of safety;Decreased awareness of deficits Awareness: Intellectual Problem  Solving: Slow processing;Requires verbal cues;Requires tactile cues General Comments: Pt with good adherence to 1-step commands, but did need questions to be repeated a few times during the session as the pt showed no response to the question asked. cues for safety and line management through sesion        Exercises     Shoulder Instructions       General Comments      Pertinent Vitals/ Pain       Pain Assessment: No/denies pain  Home Living                                           Prior Functioning/Environment              Frequency  Min 2X/week        Progress Toward Goals  OT Goals(current goals can now be found in the care plan section)  Progress towards OT goals: Progressing toward goals     Plan      Co-evaluation                 AM-PAC OT "6 Clicks" Daily Activity     Outcome Measure   Help from another person eating meals?: A Little Help from another person taking care of personal grooming?: A Little Help from another person toileting, which includes using toliet, bedpan, or urinal?: A Lot Help from another person bathing (including washing, rinsing, drying)?: A Lot Help from another person to put on and taking off regular upper body clothing?: A Little Help from another person to put on and taking off regular lower body clothing?: A Lot 6 Click Score: 15    End of Session Equipment Utilized During Treatment: Gait belt;Rolling walker  OT Visit Diagnosis: Unsteadiness on feet (R26.81);Other abnormalities of gait and mobility (R26.89);Muscle weakness (generalized) (M62.81);Other symptoms and signs involving cognitive function;Pain   Activity Tolerance Patient tolerated treatment well   Patient Left in chair;with call bell/phone within reach   Nurse Communication Other (comment) (alerted cna pt. has swished with mouthrinse but no liquids consumed per npo)        Time: 1152-1208 OT Time Calculation (min): 16 min  Charges: OT General Charges $OT Visit: 1 Visit OT Treatments $Self Care/Home Management : 8-22 mins  [name], COTA/L Acute Rehabilitation [phone]   [name] 11/28/2019, 2:02 PM

## 2019-11-28 NOTE — Progress Notes (Signed)
Inserted 14 FR urinary catheter, peri care prior to insertion.  Patient educated prior, patient tolerated well. Urine return noted.  Berneta Levins, RN

## 2019-11-28 NOTE — Consult Note (Signed)
[facility] Consult Note  Kathy Irwin 01/31/1951  [id number].    Requesting MD: Pokhrel Chief Complaint/Reason for Consult: Cholelithiasis HPI:  Patient is a 69 year old female who was admitted to the hospital 11/08/19 for RLE cellulitis. Patient was intubated and transferred to the ICU 7/26. Hospitalization complicated by acute renal failure. Extubated 8/1. Patient reports almost 2 weeks of abdominal pain (consistent with it starting around the time of her extubation). Pain is described as sharp, post-prandial epigastric abdominal pain that is non-radiating and lasts for a couple of minutes. She has been eating less food at meals because she is afraid of the pain. Denies associated N/V. Denies similar pain prior to hospital admission. States she was diagnosed with gallstones in her 20's incidentally but never really had symptoms. Denies diarrhea - reports formed, non-bloody stools every 1-2 days.   PMH significant for obesity, chronic diastolic failure, ITP, chronic hypoxemic respiratory failure on 3 L nasal cannula at baseline secondary to interstitial lung disease followed by Dr. Mannam in the ILD clinic at pulmonary clinic, history of positive PPD in the 1980s treated with INH for 1 year,History of COVID-19 in September 2020. Past abdominal surgery includes abdominal hysterectomy. Patient on 60 mg lovenox daily.   ROS: Review of Systems  All other systems reviewed and are negative.  Family History  Problem Relation Age of Onset  . Diabetes Other   . Hypertension Other     Past Medical History:  Diagnosis Date  . Acute respiratory failure with hypoxia (HCC) 12/2018  . Diabetes mellitus without complication (HCC)   . 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 Hypertension   . ILD (interstitial lung disease) (HCC) 12/24/2018    Past Surgical History:  Procedure Laterality Date  . ABDOMINAL HYSTERECTOMY    . VIDEO BRONCHOSCOPY Bilateral 04/02/2019   Procedure: VIDEO  BRONCHOSCOPY WITH FLUORO;  Surgeon: Mannam, Praveen, MD;  Location: [facility];  Service: Cardiopulmonary;  Laterality: Bilateral;    Social History:  reports that she has never smoked. She has never used smokeless tobacco. She reports that she does not drink alcohol and does not use drugs.  Allergies:  Allergies  Allergen Reactions  . Other Shortness Of Breath and Other (See Comments)    Newspaper ink =  new chest pain, also  . Iodine Other (See Comments)    "Was a long time ago" (Reaction??)  . Merbromin Other (See Comments)    Mercurochrome- "Was a long time ago" (Reaction??)  . Tape     Medications Prior to Admission  Medication Sig Dispense Refill  . acetaminophen (TYLENOL) 500 MG tablet Take 1,000 mg by mouth 2 (two) times daily as needed (for pain).    . albuterol (VENTOLIN HFA) 108 (90 Base) MCG/ACT inhaler Inhale 1-2 puffs into the lungs every 6 (six) hours as needed for wheezing or shortness of breath. 8 g 0  . Azelastine-Fluticasone (DYMISTA) 137-50 MCG/ACT SUSP Place 1-2 sprays into both nostrils daily as needed (for allergies). 23 g 1  . [employer] D3 125 MCG (5000 UT) capsule Take 5,000 Units by mouth daily.    . dextromethorphan 15 MG/5ML syrup Take 10 mLs (30 mg total) by mouth 4 (four) times daily as needed for cough. 118 mL 0  . diphenhydrAMINE (BENADRYL) 25 MG tablet Take 1 tablet (25 mg total) by mouth at bedtime as needed for itching. 90 tablet 1  . fluticasone (FLONASE) 50 MCG/ACT nasal spray Place 2 sprays into both nostrils daily as needed for allergies or   rhinitis.     . glimepiride (AMARYL) 4 MG tablet Take 1 tablet (4 mg total) by mouth daily with breakfast. 90 tablet 1  . Insulin Lispro Prot & Lispro (HUMALOG MIX 75/25 KWIKPEN) (75-25) 100 UNIT/ML Kwikpen Inject 80 Units into the skin See admin instructions. Inject 50 units into the skin before breakfast "and a second dose at bedtime 30 units (Patient taking differently: Inject 30-50 Units into the skin See admin  instructions. Use 50 units every morning then use 30 units at bedtime) 30 mL 3  . levocetirizine (XYZAL) 5 MG tablet Take 1 tablet by mouth daily as needed for allergies.     . montelukast (SINGULAIR) 10 MG tablet Take 10 mg by mouth at bedtime.     . olopatadine (PATANOL) 0.1 % ophthalmic solution Place 1 drop into both eyes 2 (two) times daily. (Patient taking differently: Place 1 drop into both eyes 2 (two) times daily as needed for allergies. ) 5 mL 0  . omeprazole (PRILOSEC) 20 MG capsule Take 1 capsule (20 mg total) by mouth daily. (Patient taking differently: Take 20 mg by mouth daily as needed (GERD). ) 30 capsule 0  . ondansetron (ZOFRAN) 4 MG tablet Take 1 tablet (4 mg total) by mouth every 8 (eight) hours as needed for nausea or vomiting. 12 tablet 0  . pravastatin (PRAVACHOL) 40 MG tablet Take 40 mg by mouth at bedtime.    . sertraline (ZOLOFT) 100 MG tablet Take 100 mg by mouth 3 times/day as needed-between meals & bedtime (for nerves).     . sodium chloride (OCEAN) 0.65 % SOLN nasal spray Place 1 spray into both nostrils as needed for congestion. 15 mL 0  . [DISCONTINUED] furosemide (LASIX) 40 MG tablet Take 40 mg by mouth daily.     . [DISCONTINUED] metoprolol (TOPROL-XL) 200 MG 24 hr tablet Take 1 tablet (200 mg total) by mouth daily. 90 tablet 1  . [DISCONTINUED] pregabalin (LYRICA) 50 MG capsule Take 1 capsule (50 mg total) by mouth 3 (three) times daily. 270 capsule 0  . blood glucose meter kit and supplies KIT Dispense based on patient and insurance preference. Use up to four times daily as directed. (FOR ICD-9 250.00, 250.01). For QAC - HS accuchecks. 1 each 1  . doxycycline (VIBRA-TABS) 100 MG tablet Take 1 tablet (100 mg total) by mouth 2 (two) times daily. 20 tablet 0  . glucose blood (FREESTYLE LITE) test strip For glucose testing every before meals at bedtime. Diagnosis E 11.65  Can substitute to any accepted brand 100 each 0  . [EXPIRED] sulfamethoxazole-trimethoprim  (BACTRIM DS) 800-160 MG tablet Take 1 tablet by mouth 2 (two) times daily for 7 days. 14 tablet 0    Blood pressure (!) 119/56, pulse 73, temperature 98.4 F (36.9 C), temperature source Oral, resp. rate 20, height 5' 3" (1.6 m), weight 115.4 kg, SpO2 99 %. Physical Exam:  General: pleasant, WD, WN white female who is laying in bed in NAD HEENT: head is normocephalic, atraumatic.  Sclera are noninjected.  PERRL.  Ears and nose without any masses or lesions.  Mouth is pink and moist. On nasal cannula.  Heart: regular, rate, and rhythm. No obvious murmurs, gallops, or rubs noted.  Palpable radial and pedal pulses bilaterally Lungs: CTAB, no wheezes, rhonchi, or rales noted.  Respiratory effort nonlabored Abd: soft, obese, TTP epigastric region without guarding or peritonitis  MS: all 4 extremities are symmetrical with no cyanosis, clubbing, or edema. Skin: warm and dry   with no masses, lesions, or rashes Neuro: Cranial nerves 2-12 grossly intact, speech is normal Psych: A&Ox3 with an appropriate affect.   Results for orders placed or performed during the hospital encounter of 11/08/19 (from the past 48 hour(s))  Glucose, capillary     Status: Abnormal   Collection Time: 11/26/19  4:05 PM  Result Value Ref Range   Glucose-Capillary 173 (H) 70 - 99 mg/dL    Comment: Glucose reference range applies only to samples taken after fasting for at least 8 hours.  Glucose, capillary     Status: Abnormal   Collection Time: 11/26/19  9:18 PM  Result Value Ref Range   Glucose-Capillary 171 (H) 70 - 99 mg/dL    Comment: Glucose reference range applies only to samples taken after fasting for at least 8 hours.  Glucose, capillary     Status: Abnormal   Collection Time: 11/27/19  6:37 AM  Result Value Ref Range   Glucose-Capillary 121 (H) 70 - 99 mg/dL    Comment: Glucose reference range applies only to samples taken after fasting for at least 8 hours.  Basic metabolic panel     Status: Abnormal    Collection Time: 11/27/19  9:05 AM  Result Value Ref Range   Sodium 142 135 - 145 mmol/L   Potassium 4.3 3.5 - 5.1 mmol/L   Chloride 107 98 - 111 mmol/L   CO2 25 22 - 32 mmol/L   Glucose, Bld 216 (H) 70 - 99 mg/dL    Comment: Glucose reference range applies only to samples taken after fasting for at least 8 hours.   BUN 6 (L) 8 - 23 mg/dL   Creatinine, Ser 0.71 0.44 - 1.00 mg/dL   Calcium 9.4 8.9 - 10.3 mg/dL   GFR calc non Af Amer >60 >60 mL/min   GFR calc Af Amer >60 >60 mL/min   Anion gap 10 5 - 15    Comment: Performed at [facility] [address] [place], [place] [address]  CBC     Status: Abnormal   Collection Time: 11/27/19  9:05 AM  Result Value Ref Range   WBC 9.0 4.0 - 10.5 K/uL   RBC 3.49 (L) 3.87 - 5.11 MIL/uL   Hemoglobin 10.8 (L) 12.0 - 15.0 g/dL   HCT 34.7 (L) 36 - 46 %   MCV 99.4 80.0 - 100.0 fL   MCH 30.9 26.0 - 34.0 pg   MCHC 31.1 30.0 - 36.0 g/dL   RDW 17.2 (H) 11.5 - 15.5 %   Platelets 126 (L) 150 - 400 K/uL    Comment: Immature Platelet Fraction may be clinically indicated, consider ordering this additional test [id number]    nRBC 0.4 (H) 0.0 - 0.2 %    Comment: Performed at [facility], [place] [address] [place], [place] [address]  Glucose, capillary     Status: Abnormal   Collection Time: 11/27/19 11:37 AM  Result Value Ref Range   Glucose-Capillary 183 (H) 70 - 99 mg/dL    Comment: Glucose reference range applies only to samples taken after fasting for at least 8 hours.  Glucose, capillary     Status: Abnormal   Collection Time: 11/27/19  4:59 PM  Result Value Ref Range   Glucose-Capillary 152 (H) 70 - 99 mg/dL    Comment: Glucose reference range applies only to samples taken after fasting for at least 8 hours.  Glucose, capillary     Status: Abnormal   Collection Time: 11/27/19  8:28 PM  Result Value Ref Range   Glucose-Capillary 156 (H) 70 - 99 mg/dL    Comment: Glucose reference range applies only to samples taken after fasting  for at least 8 hours.  Glucose, capillary     Status: Abnormal   Collection Time: 11/28/19  6:37 AM  Result Value Ref Range   Glucose-Capillary 135 (H) 70 - 99 mg/dL    Comment: Glucose reference range applies only to samples taken after fasting for at least 8 hours.  Basic metabolic panel     Status: Abnormal   Collection Time: 11/28/19  6:43 AM  Result Value Ref Range   Sodium 142 135 - 145 mmol/L   Potassium 3.8 3.5 - 5.1 mmol/L   Chloride 107 98 - 111 mmol/L   CO2 27 22 - 32 mmol/L   Glucose, Bld 146 (H) 70 - 99 mg/dL    Comment: Glucose reference range applies only to samples taken after fasting for at least 8 hours.   BUN 8 8 - 23 mg/dL   Creatinine, Ser 0.69 0.44 - 1.00 mg/dL   Calcium 9.3 8.9 - 10.3 mg/dL   GFR calc non Af Amer >60 >60 mL/min   GFR calc Af Amer >60 >60 mL/min   Anion gap 8 5 - 15    Comment: Performed at [facility], [address] [place], [place] [address]  CBC     Status: Abnormal   Collection Time: 11/28/19  6:43 AM  Result Value Ref Range   WBC 11.5 (H) 4.0 - 10.5 K/uL    Comment: WHITE COUNT CONFIRMED ON SMEAR   RBC 3.41 (L) 3.87 - 5.11 MIL/uL   Hemoglobin 10.4 (L) 12.0 - 15.0 g/dL   HCT 33.8 (L) 36 - 46 %   MCV 99.1 80.0 - 100.0 fL   MCH 30.5 26.0 - 34.0 pg   MCHC 30.8 30.0 - 36.0 g/dL   RDW 17.2 (H) 11.5 - 15.5 %   Platelets 108 (L) 150 - 400 K/uL    Comment: REPEATED TO VERIFY PLATELET COUNT CONFIRMED BY SMEAR Immature Platelet Fraction may be clinically indicated, consider ordering this additional test [id number]    nRBC 0.5 (H) 0.0 - 0.2 %    Comment: Performed at [facility], [address] [place], [place] [address]   No results found.    Assessment/Plan Obesity chronic diastolic heart failure ITP chronic hypoxemic respiratory failure on 3 L nasal cannula at baseline secondary to interstitial lung disease followed by Dr. Mannam in the ILD clinic at pulmonary clinic history of positive PPD in the 1980s treated  with INH for 1 year History of COVID-19 in September 2020. RLE cellulitis T2DM AKI  Symptomatic cholelithiasis Epigastric abdominal pain - afebrile, VSS, WBC WNL, re-check hepatic function panel - low suspicion for cholecystitis but given significant symptoms limiting PO intake, recommend HIDA to r/o calculous cholecystitis.  - will need perioperative risk stratification by pulmonology and cardiology.  - no role for antibiotics at this time. - CCS will follow   Elizabeth Simaan, PA-C [facility] 11/28/2019, 11:20 AM Please see Amion for pager number during day hours 7:00am-4:30pm  

## 2019-11-28 NOTE — Progress Notes (Addendum)
PROGRESS NOTE  Kathy Irwin FKC:[id number] DOB: Sep 17, 1950 DOA: 11/08/2019 PCP: Kerin Perna, NP   LOS: 19 days   Brief narrative: As per HPI,  69 year old female with obesity, chronic diastolic failure, ITP, chronic hypoxemic respiratory failure on 3 L nasal cannula at baseline secondary to interstitial lung disease followed by Dr. 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 The following day, patient developed encephalopathy and hypercarbia, was transferred to the ICU and intubated, diuresed aggressively in the ICU subsequently developed acute renal failure. Patient was eventually extubated on 8/1. Her kidney function continued to worsen from normal range up to 3, started on IV fluids on 8/3. Patient was then transferred from [facility] to [facility] 8/4. Her kidney function and mental status improving, hospital course complicated by recurrent urinary retention requiring foley catheter placement. Patient was seen by physical therapy and was recommended CIR placement.  Assessment/Plan:  Principal Problem:   Cellulitis of right lower leg Active Problems:   Insulin-requiring or dependent type II diabetes mellitus (HCC)   HTN (hypertension)   Acute respiratory failure (HCC)   Chronic diastolic CHF (congestive heart failure) (HCC)   Chronic respiratory failure with hypoxia (HCC)   Sepsis (HCC)  Acute hypercarbic respiratory failure with Chronic hypoxic respiratory failure/ILD Suspected OHS/OSA/ Acute on chronic diastolic CHF Sepsis  Patient was status post intubation on 11/10/2019 and was extubated on 11/16/2019 for acute on chronic respiratory failure.  Diuresis in the ICU subsequently resulted in renal failure which has improved at this time.  Patient has  been weaned to 2 to 3 L of oxygen and is currently at baseline.  Patient will need pulmonary follow-up as outpatient for sleep study and low-dose as needed Lasix at discharge.  At this time, patient is deconditioned and debilitated and has been seen by physical therapy who recommended CIR. Insurance has denied CIR. Plan for subacute rehab.  Transition of care on board.  Mild leukocytosis with WBC at 11.5.  Right upper quadrant pain/postprandial pain/gallstones Recent LFTs within normal limits.  Spoke with general surgery regarding consultation due to persistent tenderness over the right upper quadrant, postprandial pain.  Patient is refusing to eat since it because of severe pain in the abdomen.  Patient likely has symptomatic cholelithiasis.  Surgery to see the patient.  Urinary retention.  In and out cath x2.  Will place in a Foley catheter.  Hypokalemia.  Improved.  Potassium today at 3.8.   Sepsis, right lower extremity cellulitis, present on admission. Failed outpatient treatment with 10-day course of Keflex.  Patient received IV vancomycin and Zosyn till 11/16/2019.  At this time, patient has clinically improved and has completed antibiotic course.  Blood cultures and respiratory cultures are negative.   Acute kidney injury From aggressive diuresis with mild component of hydronephrosis and sepsis. Baseline creatinine of 0.9, creatinine trended up to 3.1 with overdiuresis and sepsis.    Patient was initially on Foley catheter on 11/18/2019 which was removed on 11/20/2019.  She failed Foley catheter removal so was started on Flomax and Foley catheter was replaced on 11/21/2019.  Failed voiding trial 11/27/2019.  Will need a Foley catheter again today.Continue Flomax..  Check BMP daily.  Creatinine of 0.6 at this time.  Metabolic encephalopathy Secondary to sepsis and hypercarbia.  Improving at this time.  Patient has been resumed on low-dose Lyrica.   History of ITP -Platelet count stable.  Latest  platelet count of 108   Type 2 diabetes mellitus Continue sliding scale insulin, Accu-Cheks, diabetic diet.  Last POC glucose was 135.  Chronic respiratory failure, Interstitial lung disease -On 3 L home O2 at baseline  DVT prophylaxis: SCD's Start: 11/09/19 2329  Code Status: Full code  Family Communication: Spoke with the patient's family at bedside.  Status is: Inpatient  Remains inpatient appropriate because:Need for rehabilitation/skilled nursing facility placement.,  Symptomatic gallstones , surgical consultation   Dispo: The patient is from: Home              Anticipated d/c is to: SNF.  Will likely need Foley on discharge, pending surgical evaluation.              Anticipated d/c date is: 2-3 days              Patient currently is not medically stable to d/c.  Consultants: PCCM General surgery consulted today  Procedures:  Intubation 7/26  Extubation 8/1  Foley catheter placement and removal  Antibiotics:  . None  Anti-infectives (From admission, onward)   Start     Dose/Rate Route Frequency Ordered Stop   11/12/19 1800  vancomycin (VANCOREADY) IVPB 1500 mg/300 mL  Status:  Discontinued        1,500 mg 150 mL/hr over 120 Minutes Intravenous Every 12 hours 11/12/19 0648 11/12/19 0852   11/12/19 0700  vancomycin (VANCOREADY) IVPB 1500 mg/300 mL  Status:  Discontinued        1,500 mg 150 mL/hr over 120 Minutes Intravenous  Once 11/12/19 0648 11/12/19 0852   11/10/19 0600  ceFAZolin (ANCEF) IVPB 2g/100 mL premix  Status:  Discontinued        2 g 200 mL/hr over 30 Minutes Intravenous On call to O.R. 11/09/19 2328 11/10/19 1430   11/09/19 1700  vancomycin (VANCOREADY) IVPB 750 mg/150 mL  Status:  Discontinued        750 mg 150 mL/hr over 60 Minutes Intravenous Every 12 hours 11/09/19 0445 11/12/19 0647   11/09/19 1300  piperacillin-tazobactam (ZOSYN) IVPB 3.375 g        3.375 g 12.5 mL/hr over 240 Minutes Intravenous Every 8 hours 11/09/19 0520 11/17/19  0023   11/09/19 0445  piperacillin-tazobactam (ZOSYN) IVPB 3.375 g        3.375 g 100 mL/hr over 30 Minutes Intravenous  Once 11/09/19 0442 11/09/19 0559   11/09/19 0445  vancomycin (VANCOREADY) IVPB 2000 mg/400 mL        2,000 mg 200 mL/hr over 120 Minutes Intravenous  Once 11/09/19 0444 11/09/19 0859     Subjective: Today, patient was seen and examined at bedside.  Patient complains of abdominal pain especially after eating food.  She felt voiding trial and had urinary retention overnight.  Patient denies any nausea or vomiting but is tender on right upper quadrant  Objective: Vitals:   11/28/19 0441 11/28/19 0834  BP: 139/73 (!) 119/56  Pulse: 79 73  Resp: 18 20  Temp: 98.5 F (36.9 C) 98.4 F (36.9 C)  SpO2: 97% 99%    Intake/Output Summary (Last 24 hours) at 11/28/2019 1115 Last data filed at 11/28/2019 0441 Gross per 24 hour  Intake 460 ml  Output 2050 ml  Net -1590 ml   Filed Weights   11/25/19 2131 11/26/19 0500 11/28/19 0441  Weight: 117.3 kg 117.3 kg 115.4  kg   Body mass index is 45.07 kg/m.   Physical Exam:  GENERAL: Patient is alert awake and oriented. Not in obvious distress.  On nasal cannula oxygen at 3 L/min, obese. HENT: No scleral pallor or icterus. Pupils equally reactive to light. Oral mucosa is moist NECK: is supple, no gross swelling noted. CHEST:  Diminished breath sounds bilaterally.  No obvious wheezes or crackles noted CVS: S1 and S2 heard, no murmur. Regular rate and rhythm.  ABDOMEN: Soft, mild  bowel sounds are present. Foley catheter in place.  Right upper quadrant tenderness on palpation. EXTREMITIES: Right leg with mild discoloration from previous cellulitis. CNS: Cranial nerves are intact. No focal motor deficits. SKIN: warm and dry without rashes.  Data Review: I have personally reviewed the following laboratory data and studies,  CBC: Recent Labs  Lab 11/22/19 0350 11/23/19 0251 11/27/19 0905 11/28/19 0643  WBC 5.6 5.3 9.0  11.5*  HGB 10.8* 10.7* 10.8* 10.4*  HCT 34.9* 34.7* 34.7* 33.8*  MCV 102.6* 101.8* 99.4 99.1  PLT 164 132* 126* 588*   Basic Metabolic Panel: Recent Labs  Lab 11/22/19 0350 11/23/19 0251 11/24/19 0215 11/27/19 0905 11/28/19 0643  NA 144 145 140 142 142  K 3.4* 3.6 3.4* 4.3 3.8  CL 111 109 108 107 107  CO2 _0 GLUCOSE 127* 132* 154* 216* 146*  BUN _1 6* 8  CREATININE 0.74 0.72 0.73 0.71 0.69  CALCIUM 9.8 9.7 9.3 9.4 9.3   Liver Function Tests: Recent Labs  Lab 11/22/19 0350 11/23/19 0251 11/24/19 0215  AST 15 14* 14*  ALT _2 ALKPHOS 41 46 44  BILITOT 0.8 0.5 0.4  PROT 6.2* 6.4* 6.0*  ALBUMIN 3.2* 3.0* 3.0*   No results for input(s): LIPASE, AMYLASE in the last 168 hours. No results for input(s): AMMONIA in the last 168 hours. Cardiac Enzymes: No results for input(s): CKTOTAL, CKMB, CKMBINDEX, TROPONINI in the last 168 hours. BNP (last 3 results) Recent Labs    08/01/19 1429 10/24/19 1924 11/14/19 1036  BNP 93.4 38.0 112.3*    ProBNP (last 3 results) Recent Labs    03/03/19 1151  PROBNP 70.0    CBG: Recent Labs  Lab 11/27/19 0637 11/27/19 1137 11/27/19 1659 11/27/19 2028 11/28/19 0637  GLUCAP 121* 183* 152* 156* 135*   No results found for this or any previous visit (from the past 240 hour(s)).   Studies: No results found.    Flora Lipps, MD  [facility] 11/28/2019

## 2019-11-28 NOTE — TOC Progression Note (Addendum)
Transition of Care [facility]) - Progression Note    Patient Details  Name: Kathy Irwin MRN: [id number] Date of Birth: November 30, 1950  Transition of Care [facility]) CM/SW Contact  Sharlet Salina Mila Homer, LCSW Phone Number: 11/28/2019, 2:34 PM  Clinical Narrative: Insurance authorization received online [facility] access): Plan Auth # - [id number] Navi ID # - [id number] Effective 8/13 - 8/17. Next review date 8/167 Level II approval  Adline Peals, admissions director at [facility] [place] Josem Kaufmann received and she indicated that patient can d/c tomorrow (Sat - 8/14) as Ms. Hullum has to go into isolation since not vaccinated, and a room will be available tomorrow. Daughter [name] [phone]) contacted and updated. Secure chat sent to MD and he responded that patient may not be ready for d/c tomorrow. [name] at [place] and daughter updated. CSW will continue to follow and provide SW intervention services as needed through discharge.  3:30 pm: Received call from [employer] and insurance auth information provided.      Expected Discharge Plan: Skilled Nursing Facility Barriers to Discharge: Ship broker, Other (comment) (Insurance denied CIR; daughter [name] and Lexine Baton, and patient agreeable to ST rehab)  Expected Discharge Plan and Services Expected Discharge Plan: [place] In-house Referral: Clinical Social Work [facility] consult)     Living arrangements for the past 2 months: Single Family Home                                       Social Determinants of Health (SDOH) Interventions    Readmission Risk Interventions Readmission Risk Prevention Plan 08/12/2019  Transportation Screening Complete  PCP or Specialist Appt within 3-5 Days Complete  HRI or [place] Complete  Social Work Consult for [place] Planning/Counseling Complete  Palliative Care Screening Not Applicable  Medication Review Press photographer) Complete

## 2019-11-29 DIAGNOSIS — J9601 Acute respiratory failure with hypoxia: Secondary | ICD-10-CM | POA: Diagnosis not present

## 2019-11-29 DIAGNOSIS — I5032 Chronic diastolic (congestive) heart failure: Secondary | ICD-10-CM | POA: Diagnosis not present

## 2019-11-29 DIAGNOSIS — J9611 Chronic respiratory failure with hypoxia: Secondary | ICD-10-CM | POA: Diagnosis not present

## 2019-11-29 DIAGNOSIS — L03115 Cellulitis of right lower limb: Secondary | ICD-10-CM | POA: Diagnosis not present

## 2019-11-29 LAB — GLUCOSE, CAPILLARY
Glucose-Capillary: 129 mg/dL — ABNORMAL HIGH (ref 70–99)
Glucose-Capillary: 148 mg/dL — ABNORMAL HIGH (ref 70–99)
Glucose-Capillary: 183 mg/dL — ABNORMAL HIGH (ref 70–99)
Glucose-Capillary: 166 mg/dL — ABNORMAL HIGH (ref 70–99)

## 2019-11-29 LAB — MAGNESIUM: Magnesium: 1.7 mg/dL (ref 1.7–2.4)

## 2019-11-29 LAB — CBC
HCT: 32.2 % — ABNORMAL LOW (ref 36.0–46.0)
Hemoglobin: 10.1 g/dL — ABNORMAL LOW (ref 12.0–15.0)
MCH: 31.2 pg (ref 26.0–34.0)
MCHC: 31.4 g/dL (ref 30.0–36.0)
MCV: 99.4 fL (ref 80.0–100.0)
Platelets: 104 10*3/uL — ABNORMAL LOW (ref 150–400)
RBC: 3.24 MIL/uL — ABNORMAL LOW (ref 3.87–5.11)
RDW: 17.5 % — ABNORMAL HIGH (ref 11.5–15.5)
WBC: 9.4 10*3/uL (ref 4.0–10.5)
nRBC: 1 % — ABNORMAL HIGH (ref 0.0–0.2)

## 2019-11-29 NOTE — TOC Progression Note (Signed)
Transition of Care [facility]) - Progression Note    Patient Details  Name: Kathy Irwin MRN: [id number] Date of Birth: [birth year]  Transition of Care [facility]) CM/SW [place], [place] Phone Number: 11/29/2019, 10:53 AM  Clinical Narrative:    CSW updated [name] with [place] that patient is not stable for discharge today.   Expected Discharge Plan: Skilled Nursing Facility Barriers to Discharge: Ship broker, Other (comment) (Insurance denied CIR; daughter [name] and Lexine Baton, and patient agreeable to ST rehab)  Expected Discharge Plan and Services Expected Discharge Plan: [place] In-house Referral: Clinical Social Work [facility] consult)     Living arrangements for the past 2 months: Single Family Home                                       Social Determinants of Health (SDOH) Interventions    Readmission Risk Interventions Readmission Risk Prevention Plan 08/12/2019  Transportation Screening Complete  PCP or Specialist Appt within 3-5 Days Complete  HRI or [place] Complete  Social Work Consult for [place] Planning/Counseling Complete  Palliative Care Screening Not Applicable  Medication Review Press photographer) Complete

## 2019-11-29 NOTE — Progress Notes (Addendum)
PROGRESS NOTE  Kathy Irwin JJO:[id number] DOB: [birth year] DOA: 11/08/2019 PCP: Kerin Perna, NP   LOS: 20 days   Brief narrative: As per HPI,  69 year old female with obesity, chronic diastolic failure, ITP, chronic hypoxemic respiratory failure on 3 L nasal cannula at baseline secondary to interstitial lung disease followed by Dr. 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 The following day, patient developed encephalopathy and hypercarbia, was transferred to the ICU and intubated, diuresed aggressively in the ICU subsequently developed acute renal failure. Patient was eventually extubated on 8/1. Her kidney function continued to worsen from normal range up to 3, started on IV fluids on 8/3. Patient was then transferred from [facility] to [facility] 8/4. Her kidney function and mental status improving, hospital course complicated by recurrent urinary retention requiring foley catheter placement. Patient was seen by physical therapy and was initially recommended CIR placement.  At this time plan is skilled nursing facility placement.  Assessment/Plan:  Principal Problem:   Cellulitis of right lower leg Active Problems:   Insulin-requiring or dependent type II diabetes mellitus (HCC)   HTN (hypertension)   Acute respiratory failure (HCC)   Chronic diastolic CHF (congestive heart failure) (HCC)   Chronic respiratory failure with hypoxia (HCC)   Sepsis (HCC)  Acute hypercarbic respiratory failure with Chronic hypoxic respiratory failure/ILD Suspected OHS/OSA/ Acute on chronic diastolic CHF  Patient was status post intubation on 11/10/2019 and was extubated on 11/16/2019 for acute on chronic respiratory failure.  Diuresis in the ICU subsequently resulted in  renal failure which has improved at this time.  Patient has been weaned to 2 to 3 L of oxygen and is currently at baseline.  Patient will need pulmonary follow-up as outpatient for sleep study and low-dose as needed Lasix at discharge.  At this time, patient is deconditioned and debilitated and has been seen by physical therapy who recommended CIR. Insurance has denied CIR. Plan for subacute rehab.    Right upper quadrant pain/postprandial pain/gallstones Recent LFTs within normal limits.  Seen by general surgery  likely has symptomatic cholelithiasis.  Awaiting for HIDA scan.  Surgery not considering surgery if possible due to her underlying pulmonary issues.    Urinary retention.  In and out cath x2.  Foley catheter has been placed in again due to persistent retention. Continue tamsulosin. Will continue foley catheter.  Hypokalemia.  Improved.  Latest potassium of 3.8.   Sepsis, right lower extremity cellulitis, present on admission. Failed outpatient treatment with 10-day course of Keflex.  Patient received IV vancomycin and Zosyn till 11/16/2019.  At this time, patient has clinically improved and has completed antibiotic course.  Blood cultures and respiratory cultures are negative.   Acute kidney injury Has improved at baseline.  Has failed Foley catheter removal.  Currently on a Foley catheter.  Continue Flomax.  Might need prolonged Foley placement voiding trial as outpatient..  Metabolic encephalopathy Secondary to sepsis and hypercarbia.  Improving at this time.  Patient has been resumed on low-dose Lyrica.   History of ITP -Platelet count stable.  Latest platelet count of 104   Type 2 diabetes mellitus Continue sliding scale insulin, Accu-Cheks, diabetic diet.  Last POC glucose was 129.  Chronic respiratory failure, Interstitial lung disease -On 3 L home O2 at  baseline  DVT prophylaxis: SCD's Start: 11/09/19 2329  Code Status: Full code  Family Communication: I spoke with  the patient's sister Ms. Dolores on the phone and updated her about the clinical condition of the patient.  Status is: Inpatient  Remains inpatient appropriate because:Need for rehabilitation/skilled nursing facility placement.,  Symptomatic gallstones , surgical consultation, await HIDA scan   Dispo: The patient is from: Home              Anticipated d/c is to: SNF.  Will likely need Foley on discharge, surgical evaluation.              Anticipated d/c date is: 1 to 2 days              Patient currently is not medically stable to d/c.  Consultants: PCCM General surgery   Procedures:  Intubation 7/26  Extubation 8/1  Foley catheter placement and removal  Antibiotics:  . None  Anti-infectives (From admission, onward)   Start     Dose/Rate Route Frequency Ordered Stop   11/12/19 1800  vancomycin (VANCOREADY) IVPB 1500 mg/300 mL  Status:  Discontinued        1,500 mg 150 mL/hr over 120 Minutes Intravenous Every 12 hours 11/12/19 0648 11/12/19 0852   11/12/19 0700  vancomycin (VANCOREADY) IVPB 1500 mg/300 mL  Status:  Discontinued        1,500 mg 150 mL/hr over 120 Minutes Intravenous  Once 11/12/19 0648 11/12/19 0852   11/10/19 0600  ceFAZolin (ANCEF) IVPB 2g/100 mL premix  Status:  Discontinued        2 g 200 mL/hr over 30 Minutes Intravenous On call to O.R. 11/09/19 2328 11/10/19 1430   11/09/19 1700  vancomycin (VANCOREADY) IVPB 750 mg/150 mL  Status:  Discontinued        750 mg 150 mL/hr over 60 Minutes Intravenous Every 12 hours 11/09/19 0445 11/12/19 0647   11/09/19 1300  piperacillin-tazobactam (ZOSYN) IVPB 3.375 g        3.375 g 12.5 mL/hr over 240 Minutes Intravenous Every 8 hours 11/09/19 0520 11/17/19 0023   11/09/19 0445  piperacillin-tazobactam (ZOSYN) IVPB 3.375 g        3.375 g 100 mL/hr over 30 Minutes Intravenous  Once 11/09/19 0442 11/09/19 0559   11/09/19 0445  vancomycin (VANCOREADY) IVPB 2000 mg/400 mL        2,000 mg 200 mL/hr over 120 Minutes  Intravenous  Once 11/09/19 0444 11/09/19 0859     Subjective:  Today, patient was seen and examined at bedside.  Complains of mild tenderness over the abdomen especially after eating food.  Objective: Vitals:   11/29/19 0512 11/29/19 0912  BP: 130/68 130/68  Pulse: 86 76  Resp: 18 18  Temp: 98 F (36.7 C) 97.7 F (36.5 C)  SpO2: 100% 94%    Intake/Output Summary (Last 24 hours) at 11/29/2019 0953 Last data filed at 11/29/2019 0600 Gross per 24 hour  Intake 360 ml  Output 1850 ml  Net -1490 ml   Filed Weights   11/25/19 2131 11/26/19 0500 11/28/19 0441  Weight: 117.3 kg 117.3 kg 115.4 kg   Body mass index is 45.07 kg/m.   Physical Exam:  General:  A patient is alert awake and oriented.  On nasal cannula 3 L/min, obese.   HENT:   No scleral pallor or icterus noted. Oral mucosa is moist.  Chest:  Clear breath sounds.  Diminished breath sounds bilaterally. No crackles or wheezes.  CVS: S1 &  S2 heard. No murmur.  Regular rate and rhythm. Abdomen: Soft, mild tenderness of the right upper quadrant on palpation, nondistended.  Bowel sounds are heard.  Foley catheter in place. Extremities: Right leg with mild discoloration from previous cellulitis Psych: Alert, awake and oriented, normal mood CNS:  No cranial nerve deficits.  Power equal in all extremities.   Skin: Warm and dry. .  Data Review: I have personally reviewed the following laboratory data and studies,  CBC: Recent Labs  Lab 11/23/19 0251 11/27/19 0905 11/28/19 0643 11/29/19 0445  WBC 5.3 9.0 11.5* 9.4  HGB 10.7* 10.8* 10.4* 10.1*  HCT 34.7* 34.7* 33.8* 32.2*  MCV 101.8* 99.4 99.1 99.4  PLT 132* 126* 108* 761*   Basic Metabolic Panel: Recent Labs  Lab 11/23/19 0251 11/24/19 0215 11/27/19 0905 11/28/19 0643 11/29/19 0445  NA 145 140 142 142  --   K 3.6 3.4* 4.3 3.8  --   CL 109 108 107 107  --   CO2 _0 --   GLUCOSE 132* 154* 216* 146*  --   BUN 10 10 6* 8  --   CREATININE 0.72 0.73  0.71 0.69  --   CALCIUM 9.7 9.3 9.4 9.3  --   MG  --   --   --   --  1.7   Liver Function Tests: Recent Labs  Lab 11/23/19 0251 11/24/19 0215 11/28/19 1148  AST 14* 14* 18  ALT _1 ALKPHOS 46 44 49  BILITOT 0.5 0.4 0.7  PROT 6.4* 6.0* 6.2*  ALBUMIN 3.0* 3.0* 3.2*   Recent Labs  Lab 11/28/19 1148  LIPASE 32   No results for input(s): AMMONIA in the last 168 hours. Cardiac Enzymes: No results for input(s): CKTOTAL, CKMB, CKMBINDEX, TROPONINI in the last 168 hours. BNP (last 3 results) Recent Labs    08/01/19 1429 10/24/19 1924 11/14/19 1036  BNP 93.4 38.0 112.3*    ProBNP (last 3 results) Recent Labs    03/03/19 1151  PROBNP 70.0    CBG: Recent Labs  Lab 11/28/19 0637 11/28/19 1154 11/28/19 1616 11/28/19 2024 11/29/19 0644  GLUCAP 135* 191* 104* 101* 129*   No results found for this or any previous visit (from the past 240 hour(s)).   Studies: No results found.    Flora Lipps, MD  [facility] 11/29/2019

## 2019-11-30 DIAGNOSIS — J9611 Chronic respiratory failure with hypoxia: Secondary | ICD-10-CM | POA: Diagnosis not present

## 2019-11-30 DIAGNOSIS — I5032 Chronic diastolic (congestive) heart failure: Secondary | ICD-10-CM | POA: Diagnosis not present

## 2019-11-30 DIAGNOSIS — L03115 Cellulitis of right lower limb: Secondary | ICD-10-CM | POA: Diagnosis not present

## 2019-11-30 DIAGNOSIS — J9601 Acute respiratory failure with hypoxia: Secondary | ICD-10-CM | POA: Diagnosis not present

## 2019-11-30 DIAGNOSIS — K8020 Calculus of gallbladder without cholecystitis without obstruction: Secondary | ICD-10-CM

## 2019-11-30 LAB — CBC
HCT: 32 % — ABNORMAL LOW (ref 36.0–46.0)
Hemoglobin: 10 g/dL — ABNORMAL LOW (ref 12.0–15.0)
MCH: 30.9 pg (ref 26.0–34.0)
MCHC: 31.3 g/dL (ref 30.0–36.0)
MCV: 98.8 fL (ref 80.0–100.0)
Platelets: 92 10*3/uL — ABNORMAL LOW (ref 150–400)
RBC: 3.24 MIL/uL — ABNORMAL LOW (ref 3.87–5.11)
RDW: 17.6 % — ABNORMAL HIGH (ref 11.5–15.5)
WBC: 6.2 10*3/uL (ref 4.0–10.5)
nRBC: 1.3 % — ABNORMAL HIGH (ref 0.0–0.2)

## 2019-11-30 LAB — MAGNESIUM: Magnesium: 1.5 mg/dL — ABNORMAL LOW (ref 1.7–2.4)

## 2019-11-30 LAB — BASIC METABOLIC PANEL
Anion gap: 10 (ref 5–15)
BUN: 9 mg/dL (ref 8–23)
CO2: 25 mmol/L (ref 22–32)
Calcium: 9.3 mg/dL (ref 8.9–10.3)
Chloride: 106 mmol/L (ref 98–111)
Creatinine, Ser: 0.66 mg/dL (ref 0.44–1.00)
GFR calc Af Amer: >60 mL/min (ref >60)
GFR calc non Af Amer: >60 mL/min (ref >60)
Glucose, Bld: 148 mg/dL — ABNORMAL HIGH (ref 70–99)
Potassium: 3.8 mmol/L (ref 3.5–5.1)
Sodium: 141 mmol/L (ref 135–145)

## 2019-11-30 LAB — GLUCOSE, CAPILLARY
Glucose-Capillary: 139 mg/dL — ABNORMAL HIGH (ref 70–99)
Glucose-Capillary: 189 mg/dL — ABNORMAL HIGH (ref 70–99)
Glucose-Capillary: 98 mg/dL (ref 70–99)
Glucose-Capillary: 160 mg/dL — ABNORMAL HIGH (ref 70–99)

## 2019-11-30 NOTE — Progress Notes (Addendum)
PROGRESS NOTE  Kathy Irwin NAT:[id number] DOB: 18-Nov-1950 DOA: 11/08/2019 PCP: Kerin Perna, NP   LOS: 21 days   Brief narrative: As per HPI,  69 year old female with obesity, chronic diastolic failure, ITP, chronic hypoxemic respiratory failure on 3 L nasal cannula at baseline secondary to interstitial lung disease followed by Dr. 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 The following day, patient developed encephalopathy and hypercarbia, was transferred to the ICU and intubated, diuresed aggressively in the ICU subsequently developed acute renal failure. Patient was eventually extubated on 8/1. Her kidney function continued to worsen from normal range up to 3, started on IV fluids on 8/3. Patient was then transferred from [facility] to [facility] 8/4. Her kidney function and mental status improving, hospital course complicated by recurrent urinary retention requiring foley catheter placement. Patient was seen by physical therapy and was initially recommended CIR placement.  At this time, plan is skilled nursing facility placement.  Assessment/Plan:  Principal Problem:   Cellulitis of right lower leg Active Problems:   Insulin-requiring or dependent type II diabetes mellitus (HCC)   HTN (hypertension)   Acute respiratory failure (HCC)   Chronic diastolic CHF (congestive heart failure) (HCC)   Chronic respiratory failure with hypoxia (HCC)   Sepsis (HCC)  Acute hypercarbic respiratory failure with Chronic hypoxic respiratory failure/ILD Suspected OHS/OSA/ Acute on chronic diastolic CHF  Patient was status post intubation on 11/10/2019 and was extubated on 11/16/2019 for acute on chronic respiratory failure.  Diuresis in the ICU subsequently resulted in  renal failure which has improved at this time.  Patient has been weaned to 2 to 3 L of oxygen and is currently at baseline.  Patient will need pulmonary follow-up as outpatient for sleep study and low-dose as needed Lasix at discharge.  At this time, patient is deconditioned and debilitated and has been seen by physical therapy who recommended CIR. Insurance has denied CIR. Plan for subacute rehab.    Right upper quadrant pain/postprandial pain/gallstones Recent LFTs within normal limits.  Seen by general surgery  likely has symptomatic cholelithiasis.  Awaiting for HIDA scan.  Will need cardiac/pulmonary evaluation if plan for surgery.  Urinary retention.  Failed Foley catheter twice.  Patient might need prolonged Foley catheter placement and removal as outpatient and possible urology evaluation as outpatient.  Continue tamsulosin.   Hypokalemia.  Improved.  Latest potassium of 3.8.   Sepsis, right lower extremity cellulitis, improved with IV antibiotics.    Acute kidney injury Resolved.  On a Foley catheter at this time.  Continue Flomax.  Metabolic encephalopathy Resolved.  Secondary to sepsis and hypercarbia.    History of ITP -Platelet count lower today at 94.  Closely monitor.   Type 2 diabetes mellitus Continue sliding scale insulin, Accu-Cheks, diabetic diet.  Last POC glucose was 139.  Chronic respiratory failure, Interstitial lung disease -On 3 L home O2 at baseline.  No acute issues.  DVT prophylaxis: SCD's Start: 11/09/19 2329, Lovenox subcu- will hold today  Code Status: Full code  Family Communication: None today.  I spoke with the patient's sister Ms. Dolores on the phone yesterday.  Status is: Inpatient  Remains inpatient appropriate because:Need for rehabilitation/skilled nursing facility placement.,  Symptomatic gallstones , surgical consultation, await HIDA scan   Dispo:  The patient is from: Home              Anticipated d/c is to: SNF.  Will likely need  Foley on discharge, await HIDA scan and surgical recommendations              Anticipated d/c date is: 1 to 2 days              Patient currently is not medically stable to d/c.  Consultants: PCCM General surgery   Procedures:  Intubation 7/26  Extubation 8/1  Foley catheter placement and removalx2  Antibiotics:  . None  Subjective:  Today, patient was seen and examined at bedside.  Patient denies any shortness of breath, cough, fever, nausea or vomiting.  Objective: Vitals:   11/30/19 0438 11/30/19 0813  BP: 116/62 107/89  Pulse: 71 80  Resp: 20   Temp: 98.9 F (37.2 C)   SpO2: 99%     Intake/Output Summary (Last 24 hours) at 11/30/2019 0928 Last data filed at 11/30/2019 0600 Gross per 24 hour  Intake 540 ml  Output 1125 ml  Net -585 ml   Filed Weights   11/26/19 0500 11/28/19 0441 11/29/19 2127  Weight: 117.3 kg 115.4 kg 113.9 kg   Body mass index is 44.48 kg/m.   Physical Exam: General:  A patient is alert awake and oriented.  On nasal cannula 3 L/min, morbidly obese.   HENT:   No scleral pallor or icterus noted. Oral mucosa is moist.  Chest:  Clear breath sounds.  Diminished breath sounds bilaterally. No crackles or wheezes.  CVS: S1 &S2 heard. No murmur.  Regular rate and rhythm. Abdomen: Soft, mild tenderness of the right upper quadrant on palpation, nondistended.  Bowel sounds are heard.  Foley catheter in place. Extremities: Right leg with mild discoloration from previous cellulitis Psych: Alert, awake and oriented, normal mood CNS:  No cranial nerve deficits.  Power equal in all extremities.   Skin: Warm and dry. .  Data Review: I have personally reviewed the following laboratory data and studies,  CBC: Recent Labs  Lab 11/27/19 0905 11/28/19 0643 11/29/19 0445 11/30/19 0344  WBC 9.0 11.5* 9.4 6.2  HGB 10.8* 10.4* 10.1* 10.0*  HCT 34.7* 33.8* 32.2* 32.0*  MCV 99.4 99.1 99.4 98.8  PLT 126* 108* 104* 92*   Basic Metabolic Panel: Recent  Labs  Lab 11/24/19 0215 11/27/19 0905 11/28/19 0643 11/29/19 0445 11/30/19 0344  NA 140 142 142  --  141  K 3.4* 4.3 3.8  --  3.8  CL 108 107 107  --  106  CO2 _0 --  25  GLUCOSE 154* 216* 146*  --  148*  BUN 10 6* 8  --  9  CREATININE 0.73 0.71 0.69  --  0.66  CALCIUM 9.3 9.4 9.3  --  9.3  MG  --   --   --  1.7 1.5*   Liver Function Tests: Recent Labs  Lab 11/24/19 0215 11/28/19 1148  AST 14* 18  ALT 13 16  ALKPHOS 44 49  BILITOT 0.4 0.7  PROT 6.0* 6.2*  ALBUMIN 3.0* 3.2*   Recent Labs  Lab 11/28/19 1148  LIPASE 32   No results for input(s): AMMONIA in the last 168 hours. Cardiac Enzymes: No results for input(s): CKTOTAL, CKMB, CKMBINDEX, TROPONINI in the last 168 hours. BNP (last 3 results) Recent Labs    08/01/19 1429 10/24/19 1924 11/14/19 1036  BNP 93.4 38.0 112.3*  ProBNP (last 3 results) Recent Labs    03/03/19 1151  PROBNP 70.0    CBG: Recent Labs  Lab 11/29/19 0644 11/29/19 1122 11/29/19 1626 11/29/19 2131 11/30/19 0706  GLUCAP 129* 148* 183* 166* 139*   No results found for this or any previous visit (from the past 240 hour(s)).   Studies: No results found.    Flora Lipps, MD  [facility] 11/30/2019

## 2019-12-01 ENCOUNTER — Inpatient Hospital Stay: Payer: Medicare Other

## 2019-12-01 DIAGNOSIS — I5032 Chronic diastolic (congestive) heart failure: Secondary | ICD-10-CM | POA: Diagnosis not present

## 2019-12-01 DIAGNOSIS — J9601 Acute respiratory failure with hypoxia: Secondary | ICD-10-CM | POA: Diagnosis not present

## 2019-12-01 DIAGNOSIS — Z23 Encounter for immunization: Secondary | ICD-10-CM

## 2019-12-01 DIAGNOSIS — J9611 Chronic respiratory failure with hypoxia: Secondary | ICD-10-CM | POA: Diagnosis not present

## 2019-12-01 DIAGNOSIS — L03115 Cellulitis of right lower limb: Secondary | ICD-10-CM | POA: Diagnosis not present

## 2019-12-01 LAB — GLUCOSE, CAPILLARY
Glucose-Capillary: 142 mg/dL — ABNORMAL HIGH (ref 70–99)
Glucose-Capillary: 184 mg/dL — ABNORMAL HIGH (ref 70–99)
Glucose-Capillary: 148 mg/dL — ABNORMAL HIGH (ref 70–99)
Glucose-Capillary: 130 mg/dL — ABNORMAL HIGH (ref 70–99)

## 2019-12-01 LAB — COMPREHENSIVE METABOLIC PANEL
ALT: 14 U/L (ref 0–44)
AST: 17 U/L (ref 15–41)
Albumin: 3.2 g/dL — ABNORMAL LOW (ref 3.5–5.0)
Alkaline Phosphatase: 54 U/L (ref 38–126)
Anion gap: 10 (ref 5–15)
BUN: 9 mg/dL (ref 8–23)
CO2: 26 mmol/L (ref 22–32)
Calcium: 9.6 mg/dL (ref 8.9–10.3)
Chloride: 104 mmol/L (ref 98–111)
Creatinine, Ser: 0.81 mg/dL (ref 0.44–1.00)
GFR calc Af Amer: >60 mL/min (ref >60)
GFR calc non Af Amer: >60 mL/min (ref >60)
Glucose, Bld: 151 mg/dL — ABNORMAL HIGH (ref 70–99)
Potassium: 3.6 mmol/L (ref 3.5–5.1)
Sodium: 140 mmol/L (ref 135–145)
Total Bilirubin: 0.8 mg/dL (ref 0.3–1.2)
Total Protein: 6.3 g/dL — ABNORMAL LOW (ref 6.5–8.1)

## 2019-12-01 LAB — CBC
HCT: 33.2 % — ABNORMAL LOW (ref 36.0–46.0)
Hemoglobin: 10.7 g/dL — ABNORMAL LOW (ref 12.0–15.0)
MCH: 32.1 pg (ref 26.0–34.0)
MCHC: 32.2 g/dL (ref 30.0–36.0)
MCV: 99.7 fL (ref 80.0–100.0)
Platelets: 96 10*3/uL — ABNORMAL LOW (ref 150–400)
RBC: 3.33 MIL/uL — ABNORMAL LOW (ref 3.87–5.11)
RDW: 17.6 % — ABNORMAL HIGH (ref 11.5–15.5)
WBC: 6.6 10*3/uL (ref 4.0–10.5)
nRBC: 1.5 % — ABNORMAL HIGH (ref 0.0–0.2)

## 2019-12-01 LAB — SARS CORONAVIRUS 2 BY RT PCR (HOSPITAL ORDER, PERFORMED IN ~~LOC~~ HOSPITAL LAB): SARS Coronavirus 2: NEGATIVE

## 2019-12-01 MED ORDER — POLYETHYLENE GLYCOL 3350 17 G PO PACK: 17.0000 g | PACK | Freq: Every day | ORAL | 0 refills | Status: DC | PRN

## 2019-12-01 MED ORDER — FAMOTIDINE 40 MG PO TABS: 40.0000 mg | ORAL_TABLET | Freq: Every day | ORAL | Status: DC

## 2019-12-01 MED ORDER — INSULIN ASPART 100 UNIT/ML ~~LOC~~ SOLN: 0.0000 [IU] | Freq: Three times a day (TID) | SUBCUTANEOUS | Status: DC

## 2019-12-01 MED ORDER — SIMETHICONE 80 MG PO CHEW: 80.0000 mg | CHEWABLE_TABLET | Freq: Four times a day (QID) | ORAL | Status: DC | PRN

## 2019-12-01 MED ORDER — ENSURE MAX PROTEIN PO LIQD
11.0000 [oz_av] | Freq: Three times a day (TID) | ORAL | Status: DC
  Administered 2019-12-01: 11 [oz_av] via ORAL
  Filled 2019-12-01 (×2): qty 330

## 2019-12-01 MED ORDER — LINACLOTIDE 145 MCG PO CAPS: 145.0000 ug | ORAL_CAPSULE | Freq: Every day | ORAL | Status: DC

## 2019-12-01 MED ORDER — ENSURE MAX PROTEIN PO LIQD: 11.0000 [oz_av] | Freq: Three times a day (TID) | ORAL | Status: DC

## 2019-12-01 MED ORDER — LINACLOTIDE 145 MCG PO CAPS
145.0000 ug | ORAL_CAPSULE | Freq: Every day | ORAL | Status: DC
  Administered 2019-12-01: 145 ug via ORAL
  Filled 2019-12-01 (×2): qty 1

## 2019-12-01 MED ORDER — PANTOPRAZOLE SODIUM 40 MG PO TBEC: 40.0000 mg | DELAYED_RELEASE_TABLET | Freq: Every day | ORAL | 0 refills | Status: DC

## 2019-12-01 MED ORDER — PREGABALIN 50 MG PO CAPS: 50.0000 mg | ORAL_CAPSULE | Freq: Every day | ORAL | 0 refills | Status: DC

## 2019-12-01 MED ORDER — METOPROLOL SUCCINATE ER 50 MG PO TB24: 75.0000 mg | ORAL_TABLET | Freq: Every day | ORAL | Status: DC

## 2019-12-01 MED ORDER — INSULIN ASPART 100 UNIT/ML ~~LOC~~ SOLN: 0.0000 [IU] | Freq: Every day | SUBCUTANEOUS | Status: DC

## 2019-12-01 MED ORDER — ADULT MULTIVITAMIN W/MINERALS CH: 1.0000 | ORAL_TABLET | Freq: Every day | ORAL | Status: AC

## 2019-12-01 NOTE — TOC Progression Note (Addendum)
Transition of Care [facility]) - Progression Note    Patient Details  Name: Takara Sermons MRN: [id number] Date of Birth: Dec 25, 1950  [facility] CM/SW [place], [place] Phone Number: 12/01/2019, 12:10 PM  Clinical Narrative:    CSW spoke with pt's sister by phone.  Pt would like to receive Wynetta Emery and [employer] vaccine  before going to SNF.  Pt will receive 1st dose of Pfizer vaccine today instead of [employer] Pt has not been vaccinated. CSW alert CM Hassan Rowan and inpatient covid vaccine team of pt's request. TOC Team will continue to assist with disposition planning.   Expected Discharge Plan: Skilled Nursing Facility Barriers to Discharge: Ship broker, Other (comment) (Insurance denied CIR; daughter [name] and Lexine Baton, and patient agreeable to ST rehab)  Expected Discharge Plan and Services Expected Discharge Plan: [place] In-house Referral: Clinical Social Work [facility] consult)     Living arrangements for the past 2 months: Single Family Home                                       Social Determinants of Health (SDOH) Interventions    Readmission Risk Interventions Readmission Risk Prevention Plan 08/12/2019  Transportation Screening Complete  PCP or Specialist Appt within 3-5 Days Complete  HRI or [place] Complete  Social Work Consult for [place] Planning/Counseling Complete  Palliative Care Screening Not Applicable  Medication Review Press photographer) Complete

## 2019-12-01 NOTE — Progress Notes (Addendum)
Subjective/Chief Complaint: Feeling okay this morning, reports mild supraumbilical pain   Objective: Vital signs in last 24 hours: Temp:  [98.1 F (36.7 C)-98.7 F (37.1 C)] 98.4 F (36.9 C) (08/16 0449) Pulse Rate:  [73-82] 74 (08/16 0449) Resp:  [16-18] 18 (08/16 0449) BP: (99-126)/(66-82) 126/71 (08/16 0449) SpO2:  [90 %-98 %] 98 % (08/16 0449) Last BM Date: 11/25/19  Intake/Output from previous day: 08/15 0701 - 08/16 0700 In: -  Out: 700 [Urine:700] Intake/Output this shift: No intake/output data recorded.  General appearance: alert and cooperative Resp: Unlabored Cardio: regular rate and rhythm GI: Soft, nondistended, mildly tender in the supraumbilical region, no tenderness in the epigastrium or right subcostal region Skin: Skin color, texture, turgor normal. No rashes or lesions  Lab Results:  Recent Labs    11/30/19 0344 12/01/19 0310  WBC 6.2 6.6  HGB 10.0* 10.7*  HCT 32.0* 33.2*  PLT 92* 96*   BMET Recent Labs    11/30/19 0344 12/01/19 0310  NA 141 140  K 3.8 3.6  CL 106 104  CO2 25 26  GLUCOSE 148* 151*  BUN 9 9  CREATININE 0.66 0.81  CALCIUM 9.3 9.6   PT/INR No results for input(s): LABPROT, INR in the last 72 hours. ABG No results for input(s): PHART, HCO3 in the last 72 hours.  Invalid input(s): PCO2, PO2  Studies/Results: No results found.  Anti-infectives: Anti-infectives (From admission, onward)   Start     Dose/Rate Route Frequency Ordered Stop   11/12/19 1800  vancomycin (VANCOREADY) IVPB 1500 mg/300 mL  Status:  Discontinued        1,500 mg 150 mL/hr over 120 Minutes Intravenous Every 12 hours 11/12/19 0648 11/12/19 0852   11/12/19 0700  vancomycin (VANCOREADY) IVPB 1500 mg/300 mL  Status:  Discontinued        1,500 mg 150 mL/hr over 120 Minutes Intravenous  Once 11/12/19 0648 11/12/19 0852   11/10/19 0600  ceFAZolin (ANCEF) IVPB 2g/100 mL premix  Status:  Discontinued        2 g 200 mL/hr over 30 Minutes Intravenous  On call to O.R. 11/09/19 2328 11/10/19 1430   11/09/19 1700  vancomycin (VANCOREADY) IVPB 750 mg/150 mL  Status:  Discontinued        750 mg 150 mL/hr over 60 Minutes Intravenous Every 12 hours 11/09/19 0445 11/12/19 0647   11/09/19 1300  piperacillin-tazobactam (ZOSYN) IVPB 3.375 g        3.375 g 12.5 mL/hr over 240 Minutes Intravenous Every 8 hours 11/09/19 0520 11/17/19 0023   11/09/19 0445  piperacillin-tazobactam (ZOSYN) IVPB 3.375 g        3.375 g 100 mL/hr over 30 Minutes Intravenous  Once 11/09/19 0442 11/09/19 0559   11/09/19 0445  vancomycin (VANCOREADY) IVPB 2000 mg/400 mL        2,000 mg 200 mL/hr over 120 Minutes Intravenous  Once 11/09/19 0444 11/09/19 0859      Assessment/Plan Obesity chronic diastolic heart failure ITP chronic hypoxemic respiratory failure on 3 L nasal cannula at baseline secondary to interstitial lung disease followed by Dr. 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 RLE cellulitis T2DM AKI  Symptomatic cholelithiasis Epigastric abdominal pain -HIDA pending to rule out cholecystitis, if positive would recommend perioperative risk stratification by pulmonology and cardiology, likely percutaneous cholecystostomy tube at this point given dominant medical issues  Neiva Maenza A  [facility] 11/28/2019, 11:20 AM Please see Amion for floor coverage during day hours 7:00am-4:30pm  LOS: 22 days    Clovis Riley 12/01/2019

## 2019-12-01 NOTE — Discharge Summary (Signed)
Physician Discharge Summary  Kathy Irwin TFT:[id number] DOB: May 17, 1950 DOA: 11/08/2019  PCP: Kerin Perna, NP  Admit date: 11/08/2019 Discharge date: 12/01/2019  Admitted From: Home  Discharge disposition: SNF   Recommendations for Outpatient Follow-Up:    Follow up with your primary care provider at the skilled nursing facility in 3 to 5 days  Check CBC, BMP, magnesium in the next visit.  Please follow-up with general surgery as outpatient in 1 to 2 weeks to discuss about symptomatic cholelithiasis, if she persists to have pain.  Patient will benefit from outpatient sleep study  Please consider avoiding fatty fried foods for symptomatic cholelithiasis.  Patient will be discharged with Foley catheter.  Patient has failed Foley catheter voiding trial twice.  After patient is more mobile, please consider removing Foley catheter.  If she fails, she might need urology evaluation as outpatient.  Continue oxygen via nasal cannula at 3 L/min.   Discharge Diagnosis:   Principal Problem:   Cellulitis of right lower leg Active Problems:   Insulin-requiring or dependent type II diabetes mellitus (HCC)   HTN (hypertension)   Acute respiratory failure (HCC)   Chronic diastolic CHF (congestive heart failure) (HCC)   Chronic respiratory failure with hypoxia ([place])   Sepsis ([place])    Discharge Condition: Improved.  Diet recommendation: Low sodium, heart healthy.  Carbohydrate-modified.  Low fat  Wound care: None.  Code status: Full.   History of Present Illness:   69 year old female with obesity, chronic diastolic failure, ITP, chronic hypoxemic respiratory failure on 3 L nasal cannula at baseline secondary to interstitial lung disease followed by Dr. 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 Patient was eventually extubated on 8/1. Her kidney function continued to worsen from normal range up to 3, started on IV fluids on 8/3. Patient was then transferred from [facility] to [facility] 8/4. Her kidney function and mental status improving, hospital course complicated by recurrent urinary retention requiring foley catheter placement. Patient was seen by physical therapy and was initially recommended acute rehab placement. Susequently,plan was skilled nursing facility placement.   Hospital Course:   Following conditions were addressed during hospitalization as listed below,  Acute hypercarbic respiratory failure with Chronic hypoxic respiratory failure/ILD Suspected OHS/OSA/ Acute on chronic diastolic CHF  Patient was status post intubation on 11/10/2019 and was extubated on 11/16/2019 for acute on chronic respiratory failure. Diuresis in the ICU, subsequently resulted in renal failure which has improved at this time. Patient has been weaned to 2 to 3 L of oxygen and is currently at baseline. Patient will need pulmonary follow-up as outpatient for sleep study.  Patient will be started on low-dose Lasix on discharge, dose decreased from 40 mg to 20 mg daily..At this time, patient is deconditioned and debilitated and is stable for disposition to skilled nursing facility today.  Constipation.   Patient states that she uses Linzess which has been initiated.  Continue Colace MiraLAX after discharge.  Could consider enema if needed.  Right upper quadrant pain/postprandial pain/gallstones  LFTs within normal limits. Seen by general surgery, likely has symptomatic cholelithiasis.  HIDA scan was planned but  due to lack of nuclear material it was not  performed.  I had the multiple discussion with the patient as well as patient's sister who did not wish to undergo any surgical intervention at this time..  They were aware that this might cause pain in the future and was important to discuss with surgeon as outpatient.  Patient need cardiac/pulmonary evaluation if plan for surgery as outpatient.  She was advised to avoid fatty fried foods  Urinary retention.Failed Foley catheter voiding trial twice. Patient might need prolonged Foley catheter placement and removal as outpatient and possible urology evaluation as outpatient.Continue tamsulosin.  Hopefully with ambulation and improvement of debility Foley could be removed.  Hypokalemia. Improved with replacement. Latest potassium of 3.6  Sepsis secondary to right lower extremity cellulitis,improved with IV antibiotics.   Acute kidney injury Resolved. On a Foley catheter at this time. Continue Flomax.  Metabolic encephalopathy Resolved.Secondary to sepsis and hypercarbia.   History of ITP -Platelet countlower today at 96. Closely monitor as outpatient.  No bleeding noted during hospitalization.   Type 2 diabetes mellitus Was on insulin 75/25 which has been changed to sliding scale insulin at this time.  Continue to monitor Accu-Cheks.  Patient will be continued on glimepiride.  Chronic respiratory failure, Interstitial lung disease -On 3 L home O2 at baseline.No acute issues.  We will continue supplemental oxygen at discharge.  Disposition.  At this time, patient is stable for disposition to skilled nursing facility.  Spoke with the patient's sister Ms. Dolores  on the phone and updated her about the plan for follow-up and disposition.  Medical Consultants:   PCCM General surgery   Procedures:     Intubation 7/26  Extubation 8/1  Foley catheter placement x3 and failed removalx2 Subjective:   Today, patient feels okay.  Denies overt pain.  No nausea  vomiting.  No fever chills or rigor.  Feels constipated.   Discharge Exam:   Vitals:   12/01/19 0449 12/01/19 0931  BP: 126/71 (!) 115/57  Pulse: 74 77  Resp: 18 18  Temp: 98.4 F (36.9 C) 97.9 F (36.6 C)  SpO2: 98% 94%   Vitals:   11/30/19 1821 11/30/19 2139 12/01/19 0449 12/01/19 0931  BP: 99/68 119/66 126/71 (!) 115/57  Pulse: 82 73 74 77  Resp: 16 18 18 18  Temp: 98.5 F (36.9 C) 98.7 F (37.1 C) 98.4 F (36.9 C) 97.9 F (36.6 C)  TempSrc: Oral   Oral  SpO2: 90% 93% 98% 94%  Weight:      Height:        General: Alert awake, not in obvious distress HENT: pupils equally reacting to light,  No scleral pallor or icterus noted. Oral mucosa is moist.  Chest:  Clear breath sounds.  Diminished breath sounds bilaterally. No crackles or wheezes.  CVS: S1 &S2 heard. No murmur.  Regular rate and rhythm. Abdomen: Soft, nontender, nondistended.  Bowel sounds are heard.   Extremities: No cyanosis, clubbing or edema.  Peripheral pulses are palpable. Psych: Alert, awake and oriented, normal mood CNS:  No cranial nerve deficits.  Power equal in all extremities.   Skin: Warm and dry.  No rashes noted.  The results of significant diagnostics from this hospitalization (including imaging, microbiology, ancillary and laboratory) are listed below for reference.     Diagnostic Studies:   CT HEAD WO CONTRAST  Result Date: 11/09/2019 CLINICAL DATA:  69-year-old female with history of mental status change. EXAM: CT HEAD WITHOUT CONTRAST TECHNIQUE: Contiguous axial   images were obtained from the base of the skull through the vertex without intravenous contrast. COMPARISON:  No priors. FINDINGS: Brain: Physiologic calcifications in the basal ganglia bilaterally. Patchy areas of very mild decreased attenuation are noted throughout the deep and periventricular white matter of the cerebral hemispheres bilaterally, compatible with mild chronic microvascular ischemic disease. No evidence of acute  infarction, hemorrhage, hydrocephalus, extra-axial collection or mass lesion/mass effect. Vascular: No hyperdense vessel or unexpected calcification. Skull: Normal. Negative for fracture or focal lesion. Sinuses/Orbits: No acute finding. Other: None. IMPRESSION: 1. No acute intracranial abnormalities. 2. Very mild chronic microvascular ischemic changes in the cerebral white matter, as above. Electronically Signed   By: Daniel  Entrikin M.D.   On: 11/09/2019 16:30   CT ABDOMEN PELVIS W CONTRAST  Result Date: 11/09/2019 CLINICAL DATA:  Nausea, vomiting EXAM: CT ABDOMEN AND PELVIS WITH CONTRAST TECHNIQUE: Multidetector CT imaging of the abdomen and pelvis was performed using the standard protocol following bolus administration of intravenous contrast. CONTRAST:  100mL OMNIPAQUE IOHEXOL 300 MG/ML  SOLN COMPARISON:  08/15/2019, 02/11/2019 FINDINGS: Lower chest: Motion artifact limits evaluation of the lung bases, however, areas of interstitial thickening, ground-glass infiltrate, and traction bronchiolectasis are again identified, better evaluated on prior CT examination of 02/11/2019. the visualized heart and pericardium are unremarkable. Hepatobiliary: Porcelain gallbladder. Multiple gallstones seen within the gallbladder neck. No intra or extrahepatic biliary ductal dilation. Liver unremarkable. Pancreas: Unremarkable Spleen: Unremarkable Adrenals/Urinary Tract: The adrenal glands are unremarkable. The kidneys are unremarkable. The bladder is unremarkable. Stomach/Bowel: The stomach, small bowel, and large bowel are unremarkable. Appendix normal. No free intraperitoneal gas or fluid. Vascular/Lymphatic: There is no pathologic adenopathy within the abdomen and pelvis. The abdominal vasculature is age-appropriate. Reproductive: Uterus absent. Residual ovaries are unremarkable. No adnexal masses. Other: Moderate fat containing umbilical hernia is present. Rectum unremarkable. Musculoskeletal: Degenerative changes are  noted within the lumbar spine and hips. No acute bone abnormality. IMPRESSION: No radiographic explanation for the patient's reported symptoms. Incidental findings as noted above. Electronically Signed   By: Ashesh  Parikh MD   On: 11/09/2019 21:45   DG Chest Portable 1 View  Result Date: 11/09/2019 CLINICAL DATA:  69-year-old female with possible aspiration. EXAM: PORTABLE CHEST 1 VIEW COMPARISON:  Chest radiograph dated 08/11/2019 and CT dated 08/15/2019. FINDINGS: Diffuse bilateral interstitial densities and bronchiectatic changes with greater involvement of the lower lung fields as seen on the prior CT and radiograph in keeping with chronic interstitial lung disease. Overall slight interval increase in pulmonary densities since the prior radiograph which may be related to poor inspiratory effort. Superimposed infiltrate is not entirely excluded. Clinical correlation is recommended. No lobar consolidation, large pleural effusion or pneumothorax. Stable cardiomediastinal silhouette. No acute osseous pathology. IMPRESSION: 1. Chronic interstitial lung disease.  No focal consolidation. 2. Slight interval increase in the pulmonary densities may be related to poor inspiratory effort. Superimposed infiltrate is not entirely excluded. Clinical correlation is recommended. Electronically Signed   By: Arash  Radparvar M.D.   On: 11/09/2019 15:43   VAS US LOWER EXTREMITY VENOUS (DVT)  Result Date: 11/09/2019  Lower Venous DVTStudy Indications: Swelling, and cellulitis, failed outpatient antibiotics.  Risk Factors: Venous insufficiency. Limitations: Body habitus and edema. Comparison Study: Prior study from 07/17/19 is available for comparison Performing Technologist: Candace Kanady RVS  Examination Guidelines: A complete evaluation includes B-mode imaging, spectral Doppler, color Doppler, and power Doppler as needed of all accessible portions of each vessel. Bilateral testing is considered an integral part of a  complete examination.   Limited examinations for reoccurring indications may be performed as noted. The reflux portion of the exam is performed with the patient in reverse Trendelenburg.  +---------+---------------+---------+-----------+----------+--------------+ RIGHT    CompressibilityPhasicitySpontaneityPropertiesThrombus Aging +---------+---------------+---------+-----------+----------+--------------+ CFV      Full           Yes      Yes                                 +---------+---------------+---------+-----------+----------+--------------+ SFJ      Full                                                        +---------+---------------+---------+-----------+----------+--------------+ FV Prox  Full                                                        +---------+---------------+---------+-----------+----------+--------------+ FV Mid   Full                                                        +---------+---------------+---------+-----------+----------+--------------+ FV DistalFull                                                        +---------+---------------+---------+-----------+----------+--------------+ PFV      Full                                                        +---------+---------------+---------+-----------+----------+--------------+ POP      Full           Yes      Yes                                 +---------+---------------+---------+-----------+----------+--------------+ PTV      Full                                                        +---------+---------------+---------+-----------+----------+--------------+ PERO                                                  Not visualized +---------+---------------+---------+-----------+----------+--------------+ GSV      Full                                                        +---------+---------------+---------+-----------+----------+--------------+   SSV      Full                                                         +---------+---------------+---------+-----------+----------+--------------+   +----+---------------+---------+-----------+----------+--------------+ LEFTCompressibilityPhasicitySpontaneityPropertiesThrombus Aging +----+---------------+---------+-----------+----------+--------------+ CFV Full           Yes      Yes                                 +----+---------------+---------+-----------+----------+--------------+     Summary: RIGHT: - No evidence of deep vein thrombosis in the lower extremity. No indirect evidence of obstruction proximal to the inguinal ligament. - Findings appear essentially unchanged compared to previous examination. interstitial edema throughout calf  LEFT: - No evidence of common femoral vein obstruction.  *See table(s) above for measurements and observations. Electronically signed by Christopher Clark MD on 11/09/2019 at 11:39:11 AM.    Final      Labs:   Basic Metabolic Panel: Recent Labs  Lab 11/27/19 0905 11/27/19 0905 11/28/19 0643 11/28/19 0643 11/29/19 0445 11/30/19 0344 12/01/19 0310  NA 142  --  142  --   --  141 140  K 4.3   < > 3.8   < >  --  3.8 3.6  CL 107  --  107  --   --  106 104  CO2 25  --  27  --   --  25 26  GLUCOSE 216*  --  146*  --   --  148* 151*  BUN 6*  --  8  --   --  9 9  CREATININE 0.71  --  0.69  --   --  0.66 0.81  CALCIUM 9.4  --  9.3  --   --  9.3 9.6  MG  --   --   --   --  1.7 1.5*  --    < > = values in this interval not displayed.   GFR Estimated Creatinine Clearance: 79.7 mL/min (by C-G formula based on SCr of 0.81 mg/dL). Liver Function Tests: Recent Labs  Lab 11/28/19 1148 12/01/19 0310  AST 18 17  ALT 16 14  ALKPHOS 49 54  BILITOT 0.7 0.8  PROT 6.2* 6.3*  ALBUMIN 3.2* 3.2*   Recent Labs  Lab 11/28/19 1148  LIPASE 32   No results for input(s): AMMONIA in the last 168 hours. Coagulation profile No results for input(s): INR, PROTIME in  the last 168 hours.  CBC: Recent Labs  Lab 11/27/19 0905 11/28/19 0643 11/29/19 0445 11/30/19 0344 12/01/19 0310  WBC 9.0 11.5* 9.4 6.2 6.6  HGB 10.8* 10.4* 10.1* 10.0* 10.7*  HCT 34.7* 33.8* 32.2* 32.0* 33.2*  MCV 99.4 99.1 99.4 98.8 99.7  PLT 126* 108* 104* 92* 96*   Cardiac Enzymes: No results for input(s): CKTOTAL, CKMB, CKMBINDEX, TROPONINI in the last 168 hours. BNP: Invalid input(s): POCBNP CBG: Recent Labs  Lab 11/30/19 1207 11/30/19 1715 11/30/19 2135 12/01/19 0634 12/01/19 1125  GLUCAP 189* 98 160* 142* 184*   D-Dimer No results for input(s): DDIMER in the last 72 hours. Hgb A1c No results for input(s): HGBA1C in the last 72 hours. Lipid Profile No results for input(s): CHOL, HDL, LDLCALC, TRIG, CHOLHDL, LDLDIRECT in the last 72   hours. Thyroid function studies No results for input(s): TSH, T4TOTAL, T3FREE, THYROIDAB in the last 72 hours.  Invalid input(s): FREET3 Anemia work up No results for input(s): VITAMINB12, FOLATE, FERRITIN, TIBC, IRON, RETICCTPCT in the last 72 hours. Microbiology Recent Results (from the past 240 hour(s))  SARS Coronavirus 2 by RT PCR (hospital order, performed in [place] hospital lab) Nasopharyngeal Nasopharyngeal Swab     Status: None   Collection Time: 12/01/19  1:18 PM   Specimen: Nasopharyngeal Swab  Result Value Ref Range Status   SARS Coronavirus 2 NEGATIVE NEGATIVE Final    Comment: (NOTE) SARS-CoV-2 target nucleic acids are NOT DETECTED.  The SARS-CoV-2 RNA is generally detectable in upper and lower respiratory specimens during the acute phase of infection. The lowest concentration of SARS-CoV-2 viral copies this assay can detect is 250 copies / mL. A negative result does not preclude SARS-CoV-2 infection and should not be used as the sole basis for treatment or other patient management decisions.  A negative result may occur with improper specimen collection / handling, submission of specimen other than  nasopharyngeal swab, presence of viral mutation(s) within the areas targeted by this assay, and inadequate number of viral copies (<250 copies / mL). A negative result must be combined with clinical observations, patient history, and epidemiological information.  Fact Sheet for Patients:   https://www.fda.gov/media/136312/download  Fact Sheet for Healthcare Providers: https://www.fda.gov/media/136313/download  This test is not yet approved or  cleared by the [place] FDA and has been authorized for detection and/or diagnosis of SARS-CoV-2 by FDA under an Emergency Use Authorization (EUA).  This EUA will remain in effect (meaning this test can be used) for the duration of the COVID-19 declaration under Section 564(b)(1) of the Act, 21 U.S.C. section 360bbb-3(b)(1), unless the authorization is terminated or revoked sooner.  Performed at [facility], [address] [place], [place] [address]      Discharge Instructions:   Discharge Instructions    Diet - low sodium heart healthy   Complete by: As directed    Discharge instructions   Complete by: As directed    Follow-up with your primary care provider at the skilled nursing facility in 3 to 5 days.  Please avoid fatty fried foods.  Please follow-up with general surgery as outpatient for gallbladder surgery in the future if you persist to have symptoms and if desired.   Increase activity slowly   Complete by: As directed    No wound care   Complete by: As directed      Allergies as of 12/01/2019      Reactions   Other Shortness Of Breath, Other (See Comments)   Newspaper ink =  new chest pain, also   Iodine Other (See Comments)   "Was a long time ago" (Reaction??)   Merbromin Other (See Comments)   Mercurochrome- "Was a long time ago" (Reaction??)   Tape       Medication List    STOP taking these medications   dextromethorphan 15 MG/5ML syrup   diphenhydrAMINE 25 MG tablet Commonly known as: BENADRYL     doxycycline 100 MG tablet Commonly known as: VIBRA-TABS   Insulin Lispro Prot & Lispro (75-25) 100 UNIT/ML Kwikpen Commonly known as: HumaLOG Mix 75/25 KwikPen   omeprazole 20 MG capsule Commonly known as: PRILOSEC   sulfamethoxazole-trimethoprim 800-160 MG tablet Commonly known as: BACTRIM DS     TAKE these medications   acetaminophen 500 MG tablet Commonly known as: TYLENOL Take 1,000 mg   by mouth 2 (two) times daily as needed (for pain).   albuterol 108 (90 Base) MCG/ACT inhaler Commonly known as: VENTOLIN HFA Inhale 1-2 puffs into the lungs every 6 (six) hours as needed for wheezing or shortness of breath.   Azelastine-Fluticasone 137-50 MCG/ACT Susp Commonly known as: Dymista Place 1-2 sprays into both nostrils daily as needed (for allergies).   blood glucose meter kit and supplies Kit Dispense based on patient and insurance preference. Use up to four times daily as directed. (FOR ICD-9 250.00, 250.01). For QAC - HS accuchecks.   [employer] D3 125 MCG (5000 UT) capsule Generic drug: Cholecalciferol Take 5,000 Units by mouth daily.   Ensure Max Protein Liqd Take 330 mLs (11 oz total) by mouth 3 (three) times daily.   famotidine 40 MG tablet Commonly known as: PEPCID Take 1 tablet (40 mg total) by mouth daily. Start taking on: December 02, 2019   fluticasone 50 MCG/ACT nasal spray Commonly known as: FLONASE Place 2 sprays into both nostrils daily as needed for allergies or rhinitis.   FREESTYLE LITE test strip Generic drug: glucose blood For glucose testing every before meals at bedtime. Diagnosis E 11.65  Can substitute to any accepted brand   furosemide 40 MG tablet Commonly known as: LASIX Take 0.5 tablets (20 mg total) by mouth daily. What changed: how much to take   glimepiride 4 MG tablet Commonly known as: AMARYL Take 1 tablet (4 mg total) by mouth daily with breakfast.   insulin aspart 100 UNIT/ML injection Commonly known as: novoLOG Inject 0-15 Units  into the skin 3 (three) times daily with meals.   insulin aspart 100 UNIT/ML injection Commonly known as: novoLOG Inject 0-5 Units into the skin at bedtime.   levocetirizine 5 MG tablet Commonly known as: XYZAL Take 1 tablet by mouth daily as needed for allergies.   linaclotide 145 MCG Caps capsule Commonly known as: LINZESS Take 1 capsule (145 mcg total) by mouth daily before breakfast. Start taking on: December 02, 2019   metoprolol succinate 50 MG 24 hr tablet Commonly known as: TOPROL-XL Take 1.5 tablets (75 mg total) by mouth daily. What changed:   medication strength  how much to take   montelukast 10 MG tablet Commonly known as: SINGULAIR Take 10 mg by mouth at bedtime.   multivitamin with minerals Tabs tablet Take 1 tablet by mouth daily. Start taking on: December 02, 2019   olopatadine 0.1 % ophthalmic solution Commonly known as: Patanol Place 1 drop into both eyes 2 (two) times daily. What changed:   when to take this  reasons to take this   ondansetron 4 MG tablet Commonly known as: ZOFRAN Take 1 tablet (4 mg total) by mouth every 8 (eight) hours as needed for nausea or vomiting.   pantoprazole 40 MG tablet Commonly known as: PROTONIX Take 1 tablet (40 mg total) by mouth daily.   polyethylene glycol 17 g packet Commonly known as: MIRALAX / GLYCOLAX Take 17 g by mouth daily as needed for mild constipation or moderate constipation.   pravastatin 40 MG tablet Commonly known as: PRAVACHOL Take 40 mg by mouth at bedtime.   pregabalin 50 MG capsule Commonly known as: Lyrica Take 1 capsule (50 mg total) by mouth at bedtime. What changed: when to take this   senna-docusate 8.6-50 MG tablet Commonly known as: Senokot-S Take 1 tablet by mouth 2 (two) times daily.   sertraline 100 MG tablet Commonly known as: ZOLOFT Take 100 mg by mouth 3  times/day as needed-between meals & bedtime (for nerves).   simethicone 80 MG chewable tablet Commonly known as:  MYLICON Chew 1 tablet (80 mg total) by mouth 4 (four) times daily as needed for flatulence.   sodium chloride 0.65 % Soln nasal spray Commonly known as: OCEAN Place 1 spray into both nostrils as needed for congestion.   tamsulosin 0.4 MG Caps capsule Commonly known as: FLOMAX Take 1 capsule (0.4 mg total) by mouth daily.       Contact information for follow-up providers    Rolm Bookbinder, MD. Schedule an appointment as soon as possible for a visit in 2 week(s).   Specialty: General Surgery Why: for gall bladder stones Contact information: [place] [address] [phone]            Contact information for after-discharge care    Destination    [facility]erred [facility] .   Service: Skilled Nursing Contact information: [place] [phone]                   Time coordinating discharge: 39 minutes  Signed:  Zayden Hahne  [facility] 12/01/2019, 2:49 PM

## 2019-12-01 NOTE — Progress Notes (Signed)
   Covid-19 Vaccination Clinic  Name:  Kathy Irwin    MRN: [id number] DOB: 10-Apr-1951  12/01/2019  [name] was observed post Covid-19 immunization for 15 minutes without incident. She was provided with Vaccine Information Sheet and instruction to access the V-Safe system.   [name] was instructed to call 911 with any severe reactions post vaccine: Marland Kitchen Difficulty breathing  . Swelling of face and throat  . A fast heartbeat  . A bad rash all over body  . Dizziness and weakness   Immunizations Administered    Name Date Dose VIS Date Route   Pfizer COVID-19 Vaccine 12/01/2019  1:13 PM 0.3 mL 06/11/2018 Intramuscular   Manufacturer: [employer], [employer]   Lot: [id number]   [place]: [id number]

## 2019-12-01 NOTE — Progress Notes (Signed)
Nutrition Follow-up  DOCUMENTATION CODES:   Morbid obesity  INTERVENTION:  Monitor for bowel movement. Consider addition of bowel regimen.   Ensure Max po TID, each supplement provides 150 kcal and 30 grams of protein   NUTRITION DIAGNOSIS:   Inadequate oral intake related to nausea, vomiting as evidenced by per patient/family report.  Ongoing  GOAL:   Patient will meet greater than or equal to 90% of their needs  Progressing  MONITOR:   Diet advancement, PO intake, Supplement acceptance, Labs, Skin  REASON FOR ASSESSMENT:   Ventilator, Consult Enteral/tube feeding initiation and management  ASSESSMENT:   69 yo female admitted with septic shock, cellulitis R leg. PMH includes ILD-baseline 3 L [place], obesity, ITP, HF, COVID-19.  7/26 intubated 8/1 extubated  Pt was going to d/c to CIR but was denied by insurance.   Pt c/o abdominal pain that gets worse after eating. Pt states that she has been eating less due to this pain. Per MD, pt with symptomatic cholelithiasis. General Surgery recommending HIDA to rule out calculous cholecystitis.   Per RN, pt generally accepting Ensure Max supplement.   Of note, pt has not had a BM documented since 8/10. Recommend initiation of bowel regimen.   PO Intake: 50-100% x last 8 recorded meals (68.75% average meal intake)  Labs: CBGs 98-184 Medications: Pepcid, Novolog, MVI, Protonix, Senokot-S, Ensure Max BID  Diet Order:   Diet Order            DIET SOFT Room service appropriate? Yes; Fluid consistency: Thin  Diet effective now                 EDUCATION NEEDS:   Not appropriate for education at this time  Skin:  Skin Assessment: Skin Integrity Issues: Skin Integrity Issues:: Incisions Incisions: R leg  Last BM:  8/10  Height:   Ht Readings from Last 1 Encounters:  11/11/19 _0  (1.6 m)    Weight:   Wt Readings from Last 1 Encounters:  11/29/19 113.9 kg    Ideal Body Weight:  52.3 kg  BMI:  Body  mass index is 44.48 kg/m.  Estimated Nutritional Needs:   Kcal:  1700-2000  Protein:  110-130 gm  Fluid:  >/= 1.7 L    Larkin Ina, MS, RD, LDN RD pager number and weekend/on-call pager number located in [place].

## 2019-12-01 NOTE — Progress Notes (Signed)
Spoke with patient's daughter Ivin Booty and she will come and pick up patient's bags.

## 2019-12-01 NOTE — TOC Transition Note (Signed)
Transition of Care [facility]) - CM/SW Discharge Note   Patient Details  Name: Kathy Irwin MRN: [id number] Date of Birth: 1951-01-20  [facility] of [facility]) CM/SW Contact:  Kathy Irwin Phone Number: 12/01/2019, 3:26 PM   Clinical Narrative:     Patient will Discharge To: [place] Anticipated DC Date: 12/01/19 Family Notified: yes, daughter Kathy Irwin, [phone] Transport By: Kathy Irwin   Per MD patient ready for DC to [facility] . RN, patient, patient's family, and facility notified of DC. Assessment, Fl2/Pasrr, and Discharge Summary sent to facility. RN given number for report [phone], Room # 1203). DC packet on chart. Ambulance transport requested for patient.   CSW signing off.  Reed Breech LCSWA [phone]     Final next level of care: Skilled Nursing Facility Barriers to Discharge: No Barriers Identified   Patient Goals and CMS Choice Patient states their goals for this hospitalization and ongoing recovery are:: Patient and daighters want her to get stronger so that she can return home safely CMS Medicare.gov Compare Post Acute Care list provided to:: Patient Represenative (must comment) (SNF list taken to room on 8/11 and given to daughter Kathy Irwin to give to her sister Kathy Irwin) Choice offered to / list presented to : Adult Children  Discharge Placement              Patient [facility] bed at: [facility] Patient to be transferred to facility by: [facility] Name of family member notified: Kathy Irwin Patient and family notified of of transfer: 12/01/19  Discharge Plan and Services In-house Referral: Clinical Social Work [facility] consult)                                   Social Determinants of Health (SDOH) Interventions     Readmission Risk Interventions Readmission Risk Prevention Plan 08/12/2019  Transportation Screening Complete  PCP or Specialist Appt within 3-5 Days Complete  HRI or [place] Complete  Social Work Consult for  [place] Planning/Counseling Complete  Palliative Care Screening Not Applicable  Medication Review Press photographer) Complete

## 2019-12-01 NOTE — Progress Notes (Signed)
Patient is receiving the covid vaccination (pfizer)today administer by Manuela Schwartz from covid inpatient vaccination program.

## 2019-12-19 ENCOUNTER — Telehealth: Payer: Self-pay | Admitting: Primary Care

## 2019-12-19 NOTE — Telephone Encounter (Signed)
Copied from [place] [id number]. Topic: General - Other >> Dec 19, 2019  2:32 PM Rainey Pines A wrote: Shanon Brow called Requesting verbal order for pt Home health 2w5 and 1w2. Best contact [phone]

## 2019-12-23 ENCOUNTER — Ambulatory Visit: Payer: Self-pay

## 2019-12-23 NOTE — Telephone Encounter (Signed)
Spoke with david and provided verbal orders.

## 2019-12-25 ENCOUNTER — Telehealth (INDEPENDENT_AMBULATORY_CARE_PROVIDER_SITE_OTHER): Payer: Self-pay | Admitting: Primary Care

## 2019-12-25 NOTE — Telephone Encounter (Signed)
Verbal orders given.

## 2019-12-25 NOTE — Telephone Encounter (Signed)
Home Health OT evaluation today and does not require any additional visits   Kathy Irwin Best contact: [phone]

## 2019-12-25 NOTE — Telephone Encounter (Signed)
Home Health Verbal Orders - Caller/Agency: Orlena Sheldon Number: [phone] Requesting OT/PT/Skilled Nursing/Social Work/Speech Therapy: Speech Therapy (Memory Loss) Frequency:  1w1 2w3

## 2019-12-30 ENCOUNTER — Ambulatory Visit (INDEPENDENT_AMBULATORY_CARE_PROVIDER_SITE_OTHER): Payer: Self-pay | Admitting: Primary Care

## 2019-12-30 ENCOUNTER — Other Ambulatory Visit: Payer: Self-pay

## 2019-12-30 ENCOUNTER — Encounter (INDEPENDENT_AMBULATORY_CARE_PROVIDER_SITE_OTHER): Payer: Self-pay | Admitting: Primary Care

## 2019-12-30 VITALS — BP 147/82 | HR 85 | Temp 98.2°F | Ht 63.0 in | Wt 250.8 lb

## 2019-12-30 DIAGNOSIS — I10 Essential (primary) hypertension: Secondary | ICD-10-CM

## 2019-12-30 DIAGNOSIS — Z09 Encounter for follow-up examination after completed treatment for conditions other than malignant neoplasm: Secondary | ICD-10-CM

## 2019-12-30 DIAGNOSIS — E119 Type 2 diabetes mellitus without complications: Secondary | ICD-10-CM

## 2019-12-30 MED ORDER — TAMSULOSIN HCL 0.4 MG PO CAPS: 0.4000 mg | ORAL_CAPSULE | Freq: Every day | ORAL | 1 refills | Status: DC

## 2019-12-30 MED ORDER — PRAVASTATIN SODIUM 40 MG PO TABS: 40.0000 mg | ORAL_TABLET | Freq: Every day | ORAL | 1 refills | Status: DC

## 2019-12-30 MED ORDER — BLOOD GLUCOSE METER KIT: PACK | 0 refills | Status: DC

## 2019-12-30 MED ORDER — PREGABALIN 50 MG PO CAPS: 50.0000 mg | ORAL_CAPSULE | Freq: Every day | ORAL | 1 refills | Status: DC

## 2019-12-30 MED ORDER — METFORMIN HCL 1000 MG PO TABS: 1000.0000 mg | ORAL_TABLET | Freq: Two times a day (BID) | ORAL | 3 refills | Status: DC

## 2019-12-30 MED ORDER — FUROSEMIDE 40 MG PO TABS: ORAL_TABLET | ORAL | 1 refills | Status: DC

## 2019-12-30 MED ORDER — METOPROLOL SUCCINATE ER 50 MG PO TB24: 75.0000 mg | ORAL_TABLET | Freq: Every day | ORAL | Status: DC

## 2019-12-30 MED ORDER — GLIMEPIRIDE 4 MG PO TABS: 4.0000 mg | ORAL_TABLET | Freq: Every day | ORAL | 1 refills | Status: DC

## 2019-12-30 MED ORDER — SERTRALINE HCL 100 MG PO TABS: ORAL_TABLET | ORAL | 1 refills | Status: DC

## 2019-12-30 NOTE — Progress Notes (Signed)
Assessment and Plan: Kathy Irwin was seen today for hospitalization follow-up.  Diagnoses and all orders for this visit:  HAdmit date to the hospital was 11/08/19, patient was discharged from the hospital on 12/01/19, patient was admitted for: Cellulitis of the right lower leg hospital discharge follow-up  Retrieved from hospital discharge -     CBC with Differential -     CMP14+EGFR; Future -     Magnesium  Type 2 diabetes mellitus without complication, without long-term current use of insulin (HCC) Last A1c 11/04/2019 7.1 Goal of therapy: Less than 6.5 hemoglobin A1c.  Decrease foods that are high in carbohydrates are the following rice, potatoes, breads, sugars, and pastas.  Reduction in the intake (eating) will assist in lowering your blood sugars.  Discontinue Metformin 750 ER mg daily.  Change to Metformin at 1000 mg twice daily. -     glimepiride (AMARYL) 4 MG tablet; Take 1 tablet (4 mg total) by mouth daily with breakfast. Stop insulin monitor blood sugars 3 times a day before meals call the office if blood sugar remains greater than 200 consecutive days  Essential hypertension Counseled on blood pressure goal of less than 130/80, low-sodium, DASH diet, medication compliance, 150 minutes of moderate intensity exercise per week. Discussed medication compliance, adverse effects.  Other orders -     Discontinue: blood glucose meter kit and supplies; Dispense based on patient and insurance preference. Use up to four times daily as directed. (FOR ICD-10 E10.9, E11.9). -     blood glucose meter kit and supplies; Dispense based on patient and insurance preference. Use up to four times daily as directed. (FOR ICD-10 E10.9, E11.9). -     tamsulosin (FLOMAX) 0.4 MG CAPS capsule; Take 1 capsule (0.4 mg total) by mouth daily. -     sertraline (ZOLOFT) 100 MG tablet; Take 1 pill at bedtime -     furosemide (LASIX) 40 MG tablet; Take 1 pill in the morning -     pravastatin (PRAVACHOL) 40 MG tablet;  Take 1 tablet (40 mg total) by mouth at bedtime. -     metoprolol succinate (TOPROL-XL) 50 MG 24 hr tablet; Take 1.5 tablets (75 mg total) by mouth daily. -     pregabalin (LYRICA) 50 MG capsule; Take 1 capsule (50 mg total) by mouth at bedtime. -     metFORMIN (GLUCOPHAGE) 1000 MG tablet; Take 1 tablet (1,000 mg total) by mouth 2 (two) times daily with a meal.  Past Medical History:  Diagnosis Date  . Acute respiratory failure with hypoxia ([place]) 12/2018  . Diabetes mellitus without complication ([place])   . Hypersensitivity pneumonitis ([place]) 12/19/2017  . Hypertension   . ILD (interstitial lung disease) ([place]) 12/24/2018     Allergies  Allergen Reactions  . Other Shortness Of Breath and Other (See Comments)    Newspaper ink =  new chest pain, also  . Iodine Other (See Comments)    "Was a long time ago" (Reaction??)  . Merbromin Other (See Comments)    Mercurochrome- "Was a long time ago" (Reaction??)  . Tape       Current Outpatient Medications on File Prior to Visit  Medication Sig Dispense Refill  . acetaminophen (TYLENOL) 500 MG tablet Take 1,000 mg by mouth 2 (two) times daily as needed (for pain).    Marland Kitchen albuterol (VENTOLIN HFA) 108 (90 Base) MCG/ACT inhaler Inhale 1-2 puffs into the lungs every 6 (six) hours as needed for wheezing or shortness of breath. 8 g 0  .  blood glucose meter kit and supplies KIT Dispense based on patient and insurance preference. Use up to four times daily as directed. (FOR ICD-9 250.00, 250.01). For QAC - HS accuchecks. 1 each 1  . CVS D3 125 MCG (5000 UT) capsule Take 5,000 Units by mouth daily.    . Ensure Max Protein (ENSURE MAX PROTEIN) LIQD Take 330 mLs (11 oz total) by mouth 3 (three) times daily.    . famotidine (PEPCID) 40 MG tablet Take 1 tablet (40 mg total) by mouth daily.    . fluticasone (FLONASE) 50 MCG/ACT nasal spray Place 2 sprays into both nostrils daily as needed for allergies or rhinitis.     . furosemide (LASIX) 40 MG tablet Take 0.5  tablets (20 mg total) by mouth daily. 30 tablet   . glimepiride (AMARYL) 4 MG tablet Take 1 tablet (4 mg total) by mouth daily with breakfast. 90 tablet 1  . glucose blood (FREESTYLE LITE) test strip For glucose testing every before meals at bedtime. Diagnosis E 11.65  Can substitute to any accepted brand 100 each 0  . insulin aspart (NOVOLOG) 100 UNIT/ML injection Inject 0-15 Units into the skin 3 (three) times daily with meals.    . insulin aspart (NOVOLOG) 100 UNIT/ML injection Inject 0-5 Units into the skin at bedtime.    Marland Kitchen levocetirizine (XYZAL) 5 MG tablet Take 1 tablet by mouth daily as needed for allergies.     Marland Kitchen linaclotide (LINZESS) 145 MCG CAPS capsule Take 1 capsule (145 mcg total) by mouth daily before breakfast.    . metoprolol succinate (TOPROL-XL) 50 MG 24 hr tablet Take 1.5 tablets (75 mg total) by mouth daily.    . montelukast (SINGULAIR) 10 MG tablet Take 10 mg by mouth at bedtime.     . Multiple Vitamin (MULTIVITAMIN WITH MINERALS) TABS tablet Take 1 tablet by mouth daily.    Marland Kitchen olopatadine (PATANOL) 0.1 % ophthalmic solution Place 1 drop into both eyes 2 (two) times daily. (Patient taking differently: Place 1 drop into both eyes 2 (two) times daily as needed for allergies. ) 5 mL 0  . ondansetron (ZOFRAN) 4 MG tablet Take 1 tablet (4 mg total) by mouth every 8 (eight) hours as needed for nausea or vomiting. 12 tablet 0  . pantoprazole (PROTONIX) 40 MG tablet Take 1 tablet (40 mg total) by mouth daily. 60 tablet 0  . polyethylene glycol (MIRALAX / GLYCOLAX) 17 g packet Take 17 g by mouth daily as needed for mild constipation or moderate constipation.  0  . pravastatin (PRAVACHOL) 40 MG tablet Take 40 mg by mouth at bedtime.    . pregabalin (LYRICA) 50 MG capsule Take 1 capsule (50 mg total) by mouth at bedtime.  0  . senna-docusate (SENOKOT-S) 8.6-50 MG tablet Take 1 tablet by mouth 2 (two) times daily.    . sertraline (ZOLOFT) 100 MG tablet Take 100 mg by mouth 3 times/day as  needed-between meals & bedtime (for nerves).     . simethicone (MYLICON) 80 MG chewable tablet Chew 1 tablet (80 mg total) by mouth 4 (four) times daily as needed for flatulence.    . sodium chloride (OCEAN) 0.65 % SOLN nasal spray Place 1 spray into both nostrils as needed for congestion. 15 mL 0  . tamsulosin (FLOMAX) 0.4 MG CAPS capsule Take 1 capsule (0.4 mg total) by mouth daily. 30 capsule    No current facility-administered medications on file prior to visit.    ROS: all negative except above.  Physical Exam: Filed Weights   12/30/19 1107  Weight: 250 lb 12.8 oz (113.8 kg)   BP (!) 147/82 (BP Location: Left Arm, Patient Position: Sitting, Cuff Size: Large)   Pulse 85   Temp 98.2 F (36.8 C) (Oral)   Ht 5' 3" (1.6 m)   Wt 250 lb 12.8 oz (113.8 kg)   SpO2 94%   BMI 44.43 kg/m  General Appearance: Well nourished, obese female in no apparent distress.  Unstable gait uses a walker for mobility Eyes: PERRLA, EOMs, conjunctiva no swelling or erythema Sinuses: No Frontal/maxillary tenderness ENT/Mouth: Ext aud canals clear, TMs without erythema, bulging.Marland Kitchen Hearing normal.  Neck: Supple, thyroid normal.  Respiratory: Respiratory effort normal, BS equal bilaterally without rales, rhonchi, wheezing or stridor.  Cardio: RRR with no MRGs. Brisk peripheral pulses without edema.  Abdomen: Soft, + BS.  Non tender, no guarding, rebound, hernias, masses. Lymphatics: Non tender without lymphadenopathy.  Musculoskeletal: Full ROM, 5/5 strength, upper Skin: Warm, dry without rashes, lesions, ecchymosis.  Neuro: Cranial nerves intact. Normal muscle tone, no cerebellar symptoms. Sensation intact.  Psych: Awake and oriented X 3, normal affect, Insight and Judgment appropriate.    Extended time for medication reconciliation education on medications what to take and what was discontinued also noted on AVS Kerin Perna, NP 11:20 AM

## 2019-12-30 NOTE — Patient Instructions (Addendum)
Medication reconciliation for Mrs. Stay listed below  Discontinued insulin-medication as follows metformin increased to 1014m twice daily, discontinue metformin ER 750 daily,  Restart Amaryl 4 mg daily Tamsulosin 0.436mtake at bedtime Lasix 20 mg in the morning Metoprolol 50 + 25 total of 75 mg a day will reevaluate on next visit Pravastatin 40 mg at bedtime Pregabalin 5035mt bedtime Zoloft 100m51mke at bedtime nightly   changes made to  Discontinued medications Zoloft 100mg77mneeded

## 2019-12-30 NOTE — Telephone Encounter (Signed)
Called to ask the office to refax the orders Ref# [id number], because it was illegible and some pages were missed.  CB# [phone]

## 2019-12-31 LAB — CBC WITH DIFFERENTIAL/PLATELET
Basophils Absolute: 0 10*3/uL (ref 0.0–0.2)
Basos: 0 %
EOS (ABSOLUTE): 0 10*3/uL (ref 0.0–0.4)
Eos: 0 %
Hematocrit: 35.3 % (ref 34.0–46.6)
Hemoglobin: 11.2 g/dL (ref 11.1–15.9)
Immature Grans (Abs): 0.1 10*3/uL (ref 0.0–0.1)
Immature Granulocytes: 2 %
Lymphocytes Absolute: 3 10*3/uL (ref 0.7–3.1)
Lymphs: 55 %
MCH: 30.7 pg (ref 26.6–33.0)
MCHC: 31.7 g/dL (ref 31.5–35.7)
MCV: 97 fL (ref 79–97)
Monocytes Absolute: 1.1 10*3/uL — ABNORMAL HIGH (ref 0.1–0.9)
Monocytes: 19 %
NRBC: 4 % — ABNORMAL HIGH (ref 0–0)
Neutrophils Absolute: 1.3 10*3/uL — ABNORMAL LOW (ref 1.4–7.0)
Neutrophils: 24 %
Platelets: 45 10*3/uL — CL (ref 150–450)
RBC: 3.65 x10E6/uL — ABNORMAL LOW (ref 3.77–5.28)
RDW: 16.5 % — ABNORMAL HIGH (ref 11.7–15.4)
WBC: 5.4 10*3/uL (ref 3.4–10.8)

## 2019-12-31 LAB — MAGNESIUM: Magnesium: 1.5 mg/dL — ABNORMAL LOW (ref 1.6–2.3)

## 2020-01-01 ENCOUNTER — Other Ambulatory Visit (INDEPENDENT_AMBULATORY_CARE_PROVIDER_SITE_OTHER): Payer: Self-pay | Admitting: Primary Care

## 2020-01-01 MED ORDER — MAGNESIUM GLUCONATE 30 MG PO TABS: 30.0000 mg | ORAL_TABLET | Freq: Two times a day (BID) | ORAL | 1 refills | Status: DC

## 2020-01-01 NOTE — Telephone Encounter (Signed)
Order has been refaxed.

## 2020-01-06 ENCOUNTER — Telehealth: Payer: Self-pay | Admitting: Primary Care

## 2020-01-06 NOTE — Telephone Encounter (Signed)
temCopied from [place] [id number]. Topic: General - Inquiry >> Jan 06, 2020  3:09 PM Gillis Ends D wrote: Reason for CRM: Patients blood pressure was checked today on a visit from [place] and it was high. The reading was 145/97. She is required to let the doctor know. Call back if you have any questions or you would like her to instruct the patient to do something differently. Her number is [phone]. Please advise

## 2020-01-06 NOTE — Telephone Encounter (Signed)
FYI

## 2020-01-08 ENCOUNTER — Other Ambulatory Visit (INDEPENDENT_AMBULATORY_CARE_PROVIDER_SITE_OTHER): Payer: Self-pay | Admitting: Primary Care

## 2020-01-08 NOTE — Telephone Encounter (Signed)
Sent to PCP to refill if appropriate.

## 2020-01-09 ENCOUNTER — Inpatient Hospital Stay: Payer: Medicare Other | Admitting: Hematology

## 2020-01-09 ENCOUNTER — Inpatient Hospital Stay: Payer: Medicare Other

## 2020-01-21 ENCOUNTER — Inpatient Hospital Stay (HOSPITAL_BASED_OUTPATIENT_CLINIC_OR_DEPARTMENT_OTHER): Payer: Medicare Other | Admitting: Hematology

## 2020-01-21 ENCOUNTER — Inpatient Hospital Stay: Payer: Medicare Other | Attending: Hematology

## 2020-01-21 ENCOUNTER — Other Ambulatory Visit: Payer: Self-pay

## 2020-01-21 VITALS — BP 138/64 | HR 76 | Temp 98.5°F | Resp 18 | Ht 63.0 in | Wt 244.9 lb

## 2020-01-21 DIAGNOSIS — J849 Interstitial pulmonary disease, unspecified: Secondary | ICD-10-CM | POA: Diagnosis not present

## 2020-01-21 DIAGNOSIS — I10 Essential (primary) hypertension: Secondary | ICD-10-CM | POA: Diagnosis not present

## 2020-01-21 DIAGNOSIS — D693 Immune thrombocytopenic purpura: Secondary | ICD-10-CM | POA: Insufficient documentation

## 2020-01-21 DIAGNOSIS — Z79899 Other long term (current) drug therapy: Secondary | ICD-10-CM | POA: Insufficient documentation

## 2020-01-21 DIAGNOSIS — E119 Type 2 diabetes mellitus without complications: Secondary | ICD-10-CM | POA: Insufficient documentation

## 2020-01-21 DIAGNOSIS — D696 Thrombocytopenia, unspecified: Secondary | ICD-10-CM

## 2020-01-21 DIAGNOSIS — G4730 Sleep apnea, unspecified: Secondary | ICD-10-CM | POA: Insufficient documentation

## 2020-01-21 DIAGNOSIS — Z833 Family history of diabetes mellitus: Secondary | ICD-10-CM | POA: Insufficient documentation

## 2020-01-21 DIAGNOSIS — Z888 Allergy status to other drugs, medicaments and biological substances status: Secondary | ICD-10-CM | POA: Insufficient documentation

## 2020-01-21 DIAGNOSIS — Z8249 Family history of ischemic heart disease and other diseases of the circulatory system: Secondary | ICD-10-CM | POA: Insufficient documentation

## 2020-01-21 DIAGNOSIS — Z8616 Personal history of COVID-19: Secondary | ICD-10-CM | POA: Insufficient documentation

## 2020-01-21 DIAGNOSIS — C9310 Chronic myelomonocytic leukemia not having achieved remission: Secondary | ICD-10-CM | POA: Insufficient documentation

## 2020-01-21 LAB — VITAMIN B12: Vitamin B-12: 639 pg/mL (ref 180–914)

## 2020-01-21 LAB — CMP (CANCER CENTER ONLY)
ALT: 12 U/L (ref 0–44)
AST: 20 U/L (ref 15–41)
Albumin: 4 g/dL (ref 3.5–5.0)
Alkaline Phosphatase: 62 U/L (ref 38–126)
Anion gap: 6 (ref 5–15)
BUN: 9 mg/dL (ref 8–23)
CO2: 29 mmol/L (ref 22–32)
Calcium: 10.2 mg/dL (ref 8.9–10.3)
Chloride: 109 mmol/L (ref 98–111)
Creatinine: 0.7 mg/dL (ref 0.44–1.00)
GFR, Estimated: >60 mL/min (ref >60)
Glucose, Bld: 102 mg/dL — ABNORMAL HIGH (ref 70–99)
Potassium: 3.7 mmol/L (ref 3.5–5.1)
Sodium: 144 mmol/L (ref 135–145)
Total Bilirubin: 1.2 mg/dL (ref 0.3–1.2)
Total Protein: 7.3 g/dL (ref 6.5–8.1)

## 2020-01-21 LAB — CBC WITH DIFFERENTIAL/PLATELET
Abs Immature Granulocytes: 0.1 10*3/uL — ABNORMAL HIGH (ref 0.00–0.07)
Basophils Absolute: 0 10*3/uL (ref 0.0–0.1)
Basophils Relative: 0 %
Eosinophils Absolute: 0 10*3/uL (ref 0.0–0.5)
Eosinophils Relative: 0 %
HCT: 35.7 % — ABNORMAL LOW (ref 36.0–46.0)
Hemoglobin: 11.5 g/dL — ABNORMAL LOW (ref 12.0–15.0)
Immature Granulocytes: 2 %
Lymphocytes Relative: 60 %
Lymphs Abs: 3.5 10*3/uL (ref 0.7–4.0)
MCH: 31.4 pg (ref 26.0–34.0)
MCHC: 32.2 g/dL (ref 30.0–36.0)
MCV: 97.5 fL (ref 80.0–100.0)
Monocytes Absolute: 1.4 10*3/uL — ABNORMAL HIGH (ref 0.1–1.0)
Monocytes Relative: 23 %
Neutro Abs: 0.9 10*3/uL — ABNORMAL LOW (ref 1.7–7.7)
Neutrophils Relative %: 15 %
Platelets: 64 10*3/uL — ABNORMAL LOW (ref 150–400)
RBC: 3.66 MIL/uL — ABNORMAL LOW (ref 3.87–5.11)
RDW: 19.7 % — ABNORMAL HIGH (ref 11.5–15.5)
WBC: 5.8 10*3/uL (ref 4.0–10.5)
nRBC: 1.9 % — ABNORMAL HIGH (ref 0.0–0.2)

## 2020-01-21 LAB — IRON AND TIBC
Iron: 123 ug/dL (ref 41–142)
Saturation Ratios: 45 % (ref 21–57)
TIBC: 274 ug/dL (ref 236–444)
UIBC: 151 ug/dL (ref 120–384)

## 2020-01-21 LAB — LACTATE DEHYDROGENASE: LDH: 394 U/L — ABNORMAL HIGH (ref 98–192)

## 2020-01-21 LAB — FERRITIN: Ferritin: 619 ng/mL — ABNORMAL HIGH (ref 11–307)

## 2020-01-21 LAB — IMMATURE PLATELET FRACTION: Immature Platelet Fraction: 35.9 % — ABNORMAL HIGH (ref 1.2–8.6)

## 2020-01-21 NOTE — Progress Notes (Signed)
HEMATOLOGY/ONCOLOGY CONSULTATION NOTE  Date of Service: 01/21/2020  Patient Care Team: Kerin Perna, NP as PCP - General (Internal Medicine) Troy Sine, MD as PCP - Cardiology (Cardiology)  CHIEF COMPLAINTS/PURPOSE OF CONSULTATION:  CMML Thrombocytopenia  HISTORY OF PRESENTING ILLNESS:   Kathy Irwin is a wonderful 69 y.o. 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 The pt reports that she is doing well overall.  The pt reports that she was diagnosed with CML by [facility] [place] six years ago. 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 They also did a BM Bx which showed that she had chronic leukemia but she is unsure what kind. She has not needed any treatments in 6 years and was only monitored with labs. Her RBC and WBC have remained within the normal range since her diagnosis. Previously her PLTs have gotten as low as 30K but have come back up on their own. She denies any pain or issues related to her leukemia within the last 6 years.   Pt had a recent [place] infection in August. She went back to the hospital on 09/07 with bacterial Pneumonia and was discharged on 09/10. Pt notes that her breathing has improved since that hospitalization but is not back to baseline. She was sent home with oxygen and has been using it. Pt has been using oxygen for about 8 years and she is not sure why it was given to her. Pt was previously diagnosed with Interstitial Lung Disease. She says that when her symptoms worsen she is given steroids for 6 weeks. She is currently on a course of Prednisone. Pt has had sleep apnea testing which revealed that she does not have sleep apnea. Her HTN and Diabetes have been well controlled and she has been taking her medications as prescribed. She also has home care who has been helping her walk and get some exercise. She will be going to see Dr. Ina Homes, a Pulmonologist, on 10/08.    Most recent lab results (01/09/2019) of CBC w/diff & CMP is as follows: all values are WNL except for RDW at 18.0, PLTs at 117K, nRBC Rel at 1, Glucose at 118.   On review of systems, pt denies back pain, abdominal pain and any other symptoms.   On PMHx the pt reports ILD, HTN, Diabetes, COVID19 infection, bacterial Pneumonia. On Social Hx the pt reports she is a non-smoker and does not drink alcohol  INTERVAL HISTORY:   Kathy Irwin is a wonderful 69 y.o. 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 The patient's last visit with [place] was on 09/05/2019. The pt reports that she is doing well overall.  The pt reports she was admitted with severe cellulitis in 10/2019 and needed rehab post discharge. Currently no fevers/chills/nightsweats or acute weight loss   MEDICAL HISTORY:  Past Medical History:  Diagnosis Date  . Acute respiratory failure with hypoxia ([place]) 12/2018  . Diabetes mellitus without complication ([place])   . 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 Hypertension   . ILD (interstitial lung disease) ([place]) 12/24/2018    SURGICAL HISTORY: Past Surgical History:  Procedure Laterality Date  . ABDOMINAL HYSTERECTOMY    . VIDEO BRONCHOSCOPY Bilateral 04/02/2019   Procedure: VIDEO BRONCHOSCOPY WITH FLUORO;  Surgeon: Marshell Garfinkel, MD;  Location: [facility];  Service: Cardiopulmonary;  Laterality: Bilateral;    SOCIAL HISTORY: Social History   Socioeconomic History  .  Marital status: Married    Spouse name: Not on file  . Number of children: Not on file  . Years of education: Not on file  . Highest education level: Not on file  Occupational History  . Not on file  Tobacco Use  . Smoking status: Never Smoker  . Smokeless tobacco: Never Used  Vaping Use  . Vaping Use: Never used  Substance and Sexual Activity  . Alcohol use: Never  . Drug use: Never  . Sexual activity: Not on file  Other Topics Concern  . Not on file   Social History Narrative  . Not on file   Social Determinants of Health   Financial Resource Strain:   . Difficulty of Paying Living Expenses: Not on file  Food Insecurity:   . Worried About Charity fundraiser in the Last Year: Not on file  . Ran Out of Food in the Last Year: Not on file  Transportation Needs:   . Lack of Transportation (Medical): Not on file  . Lack of Transportation (Non-Medical): Not on file  Physical Activity:   . Days of Exercise per Week: Not on file  . Minutes of Exercise per Session: Not on file  Stress:   . Feeling of Stress : Not on file  Social Connections:   . Frequency of Communication with Friends and Family: Not on file  . Frequency of Social Gatherings with Friends and Family: Not on file  . Attends Religious Services: Not on file  . Active Member of Clubs or Organizations: Not on file  . Attends Archivist Meetings: Not on file  . Marital Status: Not on file  Intimate Partner Violence:   . Fear of Current or Ex-Partner: Not on file  . Emotionally Abused: Not on file  . Physically Abused: Not on file  . Sexually Abused: Not on file    FAMILY HISTORY: Family History  Problem Relation Age of Onset  . Diabetes Other   . Hypertension Other     ALLERGIES:  is allergic to other, iodine, merbromin, and tape.  MEDICATIONS:  Current Outpatient Medications  Medication Sig Dispense Refill  . acetaminophen (TYLENOL) 500 MG tablet Take 1,000 mg by mouth 2 (two) times daily as needed (for pain).    Marland Kitchen albuterol (VENTOLIN HFA) 108 (90 Base) MCG/ACT inhaler Inhale 1-2 puffs into the lungs every 6 (six) hours as needed for wheezing or shortness of breath. 8 g 0  . blood glucose meter kit and supplies KIT Dispense based on patient and insurance preference. Use up to four times daily as directed. (FOR ICD-9 250.00, 250.01). For QAC - HS accuchecks. 1 each 1  . blood glucose meter kit and supplies Dispense based on patient and insurance  preference. Use up to four times daily as directed. (FOR ICD-10 E10.9, E11.9). 1 each 0  . CVS D3 125 MCG (5000 UT) capsule Take 5,000 Units by mouth daily.    . Ensure Max Protein (ENSURE MAX PROTEIN) LIQD Take 330 mLs (11 oz total) by mouth 3 (three) times daily.    . famotidine (PEPCID) 40 MG tablet Take 1 tablet (40 mg total) by mouth daily.    . fluticasone (FLONASE) 50 MCG/ACT nasal spray Place 2 sprays into both nostrils daily as needed for allergies or rhinitis.     . furosemide (LASIX) 40 MG tablet Take 1 pill in the morning 90 tablet 1  . glimepiride (AMARYL) 4 MG tablet Take 1 tablet (4 mg total) by  mouth daily with breakfast. 90 tablet 1  . glucose blood (FREESTYLE LITE) test strip For glucose testing every before meals at bedtime. Diagnosis E 11.65  Can substitute to any accepted brand 100 each 0  . linaclotide (LINZESS) 145 MCG CAPS capsule Take 1 capsule (145 mcg total) by mouth daily before breakfast.    . magnesium gluconate (MAGONATE) 30 MG tablet Take 1 tablet (30 mg total) by mouth 2 (two) times daily. 180 tablet 1  . metFORMIN (GLUCOPHAGE) 1000 MG tablet Take 1 tablet (1,000 mg total) by mouth 2 (two) times daily with a meal. 180 tablet 3  . metoprolol succinate (TOPROL-XL) 50 MG 24 hr tablet TAKE 1 TABLET BY MOUTH EVERY MORNING 30 tablet 1  . Multiple Vitamin (MULTIVITAMIN WITH MINERALS) TABS tablet Take 1 tablet by mouth daily.    Marland Kitchen olopatadine (PATANOL) 0.1 % ophthalmic solution Place 1 drop into both eyes 2 (two) times daily. (Patient taking differently: Place 1 drop into both eyes 2 (two) times daily as needed for allergies. ) 5 mL 0  . ondansetron (ZOFRAN) 4 MG tablet Take 1 tablet (4 mg total) by mouth every 8 (eight) hours as needed for nausea or vomiting. 12 tablet 0  . pantoprazole (PROTONIX) 40 MG tablet Take 1 tablet (40 mg total) by mouth daily. 60 tablet 0  . polyethylene glycol (MIRALAX / GLYCOLAX) 17 g packet Take 17 g by mouth daily as needed for mild  constipation or moderate constipation.  0  . pravastatin (PRAVACHOL) 40 MG tablet Take 1 tablet (40 mg total) by mouth at bedtime. 90 tablet 1  . pregabalin (LYRICA) 50 MG capsule Take 1 capsule (50 mg total) by mouth at bedtime. 90 capsule 1  . senna-docusate (SENOKOT-S) 8.6-50 MG tablet Take 1 tablet by mouth 2 (two) times daily.    . sertraline (ZOLOFT) 100 MG tablet Take 1 pill at bedtime 90 tablet 1  . simethicone (MYLICON) 80 MG chewable tablet Chew 1 tablet (80 mg total) by mouth 4 (four) times daily as needed for flatulence.    . sodium chloride (OCEAN) 0.65 % SOLN nasal spray Place 1 spray into both nostrils as needed for congestion. 15 mL 0  . tamsulosin (FLOMAX) 0.4 MG CAPS capsule Take 1 capsule (0.4 mg total) by mouth daily. 90 capsule 1   No current facility-administered medications for this visit.    REVIEW OF SYSTEMS:   A 10+ POINT REVIEW OF SYSTEMS WAS OBTAINED including neurology, dermatology, psychiatry, cardiac, respiratory, lymph, extremities, GI, GU, Musculoskeletal, constitutional, breasts, reproductive, HEENT.  All pertinent positives are noted in the HPI.  All others are negative.   PHYSICAL EXAMINATION: ECOG PERFORMANCE STATUS: 2 - Symptomatic, <50% confined to bed  . There were no vitals filed for this visit. There were no vitals filed for this visit. .There is no height or weight on file to calculate BMI.   NAD GENERAL:alert, in no acute distress and comfortable SKIN: no acute rashes, no significant lesions EYES: conjunctiva are pink and non-injected, sclera anicteric OROPHARYNX: MMM, no exudates, no oropharyngeal erythema or ulceration NECK: supple, no JVD LYMPH:  no palpable lymphadenopathy in the cervical, axillary or inguinal regions LUNGS: clear to auscultation b/l with normal respiratory effort HEART: regular rate & rhythm ABDOMEN:  normoactive bowel sounds , non tender, not distended. No palpable hepatosplenomegaly.  Extremity: no pedal  edema PSYCH: alert & oriented x 3 with fluent speech NEURO: no focal motor/sensory deficits  LABORATORY DATA:  I have reviewed the data  as listed  . CBC Latest Ref Rng & Units 01/21/2020 12/30/2019 12/01/2019  WBC 4.0 - 10.5 K/uL 5.8 5.4 6.6  Hemoglobin 12.0 - 15.0 g/dL 11.5(L) 11.2 10.7(L)  Hematocrit 36 - 46 % 35.7(L) 35.3 33.2(L)  Platelets 150 - 400 K/uL 64(L) 45(LL) 96(L)    . CMP Latest Ref Rng & Units 01/21/2020 12/01/2019 11/30/2019  Glucose 70 - 99 mg/dL 102(H) 151(H) 148(H)  BUN 8 - 23 mg/dL _0 Creatinine 0.44 - 1.00 mg/dL 0.70 0.81 0.66  Sodium 135 - 145 mmol/L 144 140 141  Potassium 3.5 - 5.1 mmol/L 3.7 3.6 3.8  Chloride 98 - 111 mmol/L 109 104 106  CO2 22 - 32 mmol/L _1 Calcium 8.9 - 10.3 mg/dL 10.2 9.6 9.3  Total Protein 6.5 - 8.1 g/dL 7.3 6.3(L) -  Total Bilirubin 0.3 - 1.2 mg/dL 1.2 0.8 -  Alkaline Phos 38 - 126 U/L 62 54 -  AST 15 - 41 U/L 20 17 -  ALT 0 - 44 U/L 12 14 -   01/21/2019 FISH:    RADIOGRAPHIC STUDIES: I have personally reviewed the radiological images as listed and agreed with the findings in the report. No results found.  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 ITP is like since platelets improved significantly with recent steroid use.  PLAN:  - labs stable plt>50K -labs reviewed with patient -No indication to begin treatment of CMML at this time. No lab or clinical evidence of significant progression. -will need rx of chronic ITP if PLT<50k or bleeding issues arise -avoid medications that can lower platelets -f/u with PCP to update vaccinations as appropriate.    FOLLOW UP: RTC with Dr Irene Limbo with labs in 4 months  The total time spent in the appt was 20 minutes and more than 50% was on counseling and direct patient cares.  All of the patient's questions were answered with apparent satisfaction. The patient  knows to call the clinic with any problems, questions or concerns.   Sullivan Lone MD [place] AAHIVMS [facility]  (Office):       [phone] (Work cell):  [phone] (Fax):           [phone]  01/21/2020 8:55 AM  I, Yevette Edwards, am acting as a scribe for Dr. Sullivan Lone.   .I have reviewed the above documentation for accuracy and completeness, and I agree with the above. Brunetta Genera MD

## 2020-01-29 ENCOUNTER — Telehealth (INDEPENDENT_AMBULATORY_CARE_PROVIDER_SITE_OTHER): Payer: Self-pay | Admitting: Primary Care

## 2020-01-29 NOTE — Telephone Encounter (Unsigned)
Copied from [place] [id number]. Topic: Quick Communication - Home Health Verbal Orders >> Jan 29, 2020  5:03 PM Yvette Rack wrote: Caller/Agency: Monique with [facility] Number: [phone] Requesting OT/PT/Skilled Nursing/Social Work/Speech Therapy: PT Frequency: extend PT 2 week 2 effective February 01, 2020

## 2020-01-30 NOTE — Telephone Encounter (Signed)
Verbal order given to [facility] for PT extension X's 2 weeks

## 2020-02-05 ENCOUNTER — Ambulatory Visit (INDEPENDENT_AMBULATORY_CARE_PROVIDER_SITE_OTHER): Payer: Medicare Other | Admitting: Primary Care

## 2020-02-05 ENCOUNTER — Other Ambulatory Visit: Payer: Self-pay

## 2020-02-05 ENCOUNTER — Encounter (INDEPENDENT_AMBULATORY_CARE_PROVIDER_SITE_OTHER): Payer: Self-pay | Admitting: Primary Care

## 2020-02-05 VITALS — BP 147/84 | HR 85 | Temp 97.5°F | Ht 63.0 in | Wt 244.4 lb

## 2020-02-05 DIAGNOSIS — M542 Cervicalgia: Secondary | ICD-10-CM

## 2020-02-05 DIAGNOSIS — E119 Type 2 diabetes mellitus without complications: Secondary | ICD-10-CM | POA: Diagnosis not present

## 2020-02-05 DIAGNOSIS — Z1231 Encounter for screening mammogram for malignant neoplasm of breast: Secondary | ICD-10-CM | POA: Diagnosis not present

## 2020-02-05 DIAGNOSIS — Z1211 Encounter for screening for malignant neoplasm of colon: Secondary | ICD-10-CM

## 2020-02-05 LAB — GLUCOSE, POCT (MANUAL RESULT ENTRY): POC Glucose: 110 mg/dl — AB (ref 70–99)

## 2020-02-05 LAB — POCT GLYCOSYLATED HEMOGLOBIN (HGB A1C): Hemoglobin A1C: 5.6 % (ref 4.0–5.6)

## 2020-02-05 MED ORDER — PREGABALIN 100 MG PO CAPS: 100.0000 mg | ORAL_CAPSULE | Freq: Every day | ORAL | 0 refills | Status: DC

## 2020-02-05 MED ORDER — FUROSEMIDE 20 MG PO TABS
ORAL_TABLET | ORAL | 1 refills | Status: DC
Start: 2020-02-05 — End: 2020-08-24

## 2020-02-05 MED ORDER — FLUTICASONE PROPIONATE 50 MCG/ACT NA SUSP: 2.0000 | Freq: Every day | NASAL | 3 refills | Status: DC | PRN

## 2020-02-05 MED ORDER — METOPROLOL SUCCINATE ER 25 MG PO TB24: 25.0000 mg | ORAL_TABLET | Freq: Every day | ORAL | 1 refills | Status: DC

## 2020-02-05 MED ORDER — METHOCARBAMOL 500 MG PO TABS: 500.0000 mg | ORAL_TABLET | Freq: Three times a day (TID) | ORAL | 1 refills | Status: DC | PRN

## 2020-02-05 NOTE — Patient Instructions (Signed)
Kathy Regal, MD 5.0 (3)  Sur[id number]  [phone] Open ? Closes 5PM

## 2020-02-05 NOTE — Progress Notes (Signed)
Established Patient Office Visit  Subjective:  Patient ID: Kathy Irwin, female    DOB: 07-08-1950  Age: 69 y.o. MRN: [id number]  CC:  Chief Complaint  Patient presents with  . Diabetes    HPI [name] is a 69 year old female who presents for the management of type 2 diabetes.  She denies any polyphagia, polyuria or polydipsia.  Blood pressure slightly elevated at 147/84-also denies shortness of breath, headaches, chest pain or lower extremity edema  Past Medical History:  Diagnosis Date  . Acute respiratory failure with hypoxia ([place]) 12/2018  . Diabetes mellitus without complication ([place])   . Hypersensitivity pneumonitis ([place]) 12/19/2017  . Hypertension   . ILD (interstitial lung disease) ([place]) 12/24/2018    Past Surgical History:  Procedure Laterality Date  . ABDOMINAL HYSTERECTOMY    . VIDEO BRONCHOSCOPY Bilateral 04/02/2019   Procedure: VIDEO BRONCHOSCOPY WITH FLUORO;  Surgeon: Marshell Garfinkel, MD;  Location: [facility];  Service: Cardiopulmonary;  Laterality: Bilateral;    Family History  Problem Relation Age of Onset  . Diabetes Other   . Hypertension Other     Social History   Socioeconomic History  . Marital status: Married    Spouse name: Not on file  . Number of children: Not on file  . Years of education: Not on file  . Highest education level: Not on file  Occupational History  . Not on file  Tobacco Use  . Smoking status: Never Smoker  . Smokeless tobacco: Never Used  Vaping Use  . Vaping Use: Never used  Substance and Sexual Activity  . Alcohol use: Never  . Drug use: Never  . Sexual activity: Not on file  Other Topics Concern  . Not on file  Social History Narrative  . Not on file   Social Determinants of Health   Financial Resource Strain:   . Difficulty of Paying Living Expenses: Not on file  Food Insecurity:   . Worried About Charity fundraiser in the Last Year: Not on file  . Ran Out of Food in the Last Year: Not on file   Transportation Needs:   . Lack of Transportation (Medical): Not on file  . Lack of Transportation (Non-Medical): Not on file  Physical Activity:   . Days of Exercise per Week: Not on file  . Minutes of Exercise per Session: Not on file  Stress:   . Feeling of Stress : Not on file  Social Connections:   . Frequency of Communication with Friends and Family: Not on file  . Frequency of Social Gatherings with Friends and Family: Not on file  . Attends Religious Services: Not on file  . Active Member of Clubs or Organizations: Not on file  . Attends Archivist Meetings: Not on file  . Marital Status: Not on file  Intimate Partner Violence:   . Fear of Current or Ex-Partner: Not on file  . Emotionally Abused: Not on file  . Physically Abused: Not on file  . Sexually Abused: Not on file    Outpatient Medications Prior to Visit  Medication Sig Dispense Refill  . acetaminophen (TYLENOL) 500 MG tablet Take 1,000 mg by mouth 2 (two) times daily as needed (for pain).    Marland Kitchen albuterol (VENTOLIN HFA) 108 (90 Base) MCG/ACT inhaler Inhale 1-2 puffs into the lungs every 6 (six) hours as needed for wheezing or shortness of breath. 8 g 0  . blood glucose meter kit and supplies KIT Dispense based on patient  and insurance preference. Use up to four times daily as directed. (FOR ICD-9 250.00, 250.01). For QAC - HS accuchecks. 1 each 1  . blood glucose meter kit and supplies Dispense based on patient and insurance preference. Use up to four times daily as directed. (FOR ICD-10 E10.9, E11.9). 1 each 0  . CVS D3 125 MCG (5000 UT) capsule Take 5,000 Units by mouth daily.    . Ensure Max Protein (ENSURE MAX PROTEIN) LIQD Take 330 mLs (11 oz total) by mouth 3 (three) times daily.    . famotidine (PEPCID) 40 MG tablet Take 1 tablet (40 mg total) by mouth daily.    Marland Kitchen glucose blood (FREESTYLE LITE) test strip For glucose testing every before meals at bedtime. Diagnosis E 11.65  Can substitute to any  accepted brand 100 each 0  . linaclotide (LINZESS) 145 MCG CAPS capsule Take 1 capsule (145 mcg total) by mouth daily before breakfast.    . magnesium gluconate (MAGONATE) 30 MG tablet Take 1 tablet (30 mg total) by mouth 2 (two) times daily. 180 tablet 1  . metFORMIN (GLUCOPHAGE) 1000 MG tablet Take 1 tablet (1,000 mg total) by mouth 2 (two) times daily with a meal. 180 tablet 3  . metoprolol succinate (TOPROL-XL) 50 MG 24 hr tablet TAKE 1 TABLET BY MOUTH EVERY MORNING 30 tablet 1  . Multiple Vitamin (MULTIVITAMIN WITH MINERALS) TABS tablet Take 1 tablet by mouth daily.    Marland Kitchen olopatadine (PATANOL) 0.1 % ophthalmic solution Place 1 drop into both eyes 2 (two) times daily. (Patient taking differently: Place 1 drop into both eyes 2 (two) times daily as needed for allergies. ) 5 mL 0  . ondansetron (ZOFRAN) 4 MG tablet Take 1 tablet (4 mg total) by mouth every 8 (eight) hours as needed for nausea or vomiting. 12 tablet 0  . pantoprazole (PROTONIX) 40 MG tablet Take 1 tablet (40 mg total) by mouth daily. 60 tablet 0  . polyethylene glycol (MIRALAX / GLYCOLAX) 17 g packet Take 17 g by mouth daily as needed for mild constipation or moderate constipation.  0  . pravastatin (PRAVACHOL) 40 MG tablet Take 1 tablet (40 mg total) by mouth at bedtime. 90 tablet 1  . senna-docusate (SENOKOT-S) 8.6-50 MG tablet Take 1 tablet by mouth 2 (two) times daily.    . sertraline (ZOLOFT) 100 MG tablet Take 1 pill at bedtime 90 tablet 1  . simethicone (MYLICON) 80 MG chewable tablet Chew 1 tablet (80 mg total) by mouth 4 (four) times daily as needed for flatulence.    . sodium chloride (OCEAN) 0.65 % SOLN nasal spray Place 1 spray into both nostrils as needed for congestion. 15 mL 0  . tamsulosin (FLOMAX) 0.4 MG CAPS capsule Take 1 capsule (0.4 mg total) by mouth daily. 90 capsule 1  . fluticasone (FLONASE) 50 MCG/ACT nasal spray Place 2 sprays into both nostrils daily as needed for allergies or rhinitis.     . furosemide  (LASIX) 40 MG tablet Take 1 pill in the morning 90 tablet 1  . glimepiride (AMARYL) 4 MG tablet Take 1 tablet (4 mg total) by mouth daily with breakfast. 90 tablet 1  . pregabalin (LYRICA) 50 MG capsule Take 1 capsule (50 mg total) by mouth at bedtime. 90 capsule 1   No facility-administered medications prior to visit.    Allergies  Allergen Reactions  . Other Shortness Of Breath and Other (See Comments)    Newspaper ink =  new chest pain, also  .  Iodine Other (See Comments)    "Was a long time ago" (Reaction??)  . Merbromin Other (See Comments)    Mercurochrome- "Was a long time ago" (Reaction??)  . Tape     ROS Review of Systems  Musculoskeletal: Positive for neck pain.  All other systems reviewed and are negative.     Objective:    Physical Exam Vitals reviewed.  Constitutional:      Appearance: Normal appearance. She is obese.  HENT:     Head: Normocephalic.     Right Ear: Tympanic membrane normal.     Left Ear: Tympanic membrane normal.     Nose: Nose normal.  Cardiovascular:     Rate and Rhythm: Normal rate and regular rhythm.  Pulmonary:     Effort: Pulmonary effort is normal.     Breath sounds: Normal breath sounds.  Musculoskeletal:        General: Normal range of motion.     Cervical back: Normal range of motion and neck supple.  Skin:    General: Skin is warm and dry.  Neurological:     Mental Status: She is oriented to person, place, and time.     BP (!) 147/84 (BP Location: Left Arm, Patient Position: Sitting, Cuff Size: Normal)   Pulse 85   Temp (!) 97.5 F (36.4 C) (Temporal)   Ht 5' 3" (1.6 m)   Wt 244 lb 6.4 oz (110.9 kg)   SpO2 95%   BMI 43.29 kg/m  Wt Readings from Last 3 Encounters:  02/05/20 244 lb 6.4 oz (110.9 kg)  01/21/20 244 lb 14.4 oz (111.1 kg)  12/30/19 250 lb 12.8 oz (113.8 kg)     Health Maintenance Due  Topic Date Due  . Hepatitis C Screening  Never done  . URINE MICROALBUMIN  Never done  . MAMMOGRAM  Never done   . COLONOSCOPY  Never done  . PNA vac Low Risk Adult (1 of 2 - PCV13) Never done    There are no preventive care reminders to display for this patient.  Lab Results  Component Value Date   TSH 2.060 04/04/2019   Lab Results  Component Value Date   WBC 5.8 01/21/2020   HGB 11.5 (L) 01/21/2020   HCT 35.7 (L) 01/21/2020   MCV 97.5 01/21/2020   PLT 64 (L) 01/21/2020   Lab Results  Component Value Date   NA 144 01/21/2020   K 3.7 01/21/2020   CO2 29 01/21/2020   GLUCOSE 102 (H) 01/21/2020   BUN 9 01/21/2020   CREATININE 0.70 01/21/2020   BILITOT 1.2 01/21/2020   ALKPHOS 62 01/21/2020   AST 20 01/21/2020   ALT 12 01/21/2020   PROT 7.3 01/21/2020   ALBUMIN 4.0 01/21/2020   CALCIUM 10.2 01/21/2020   ANIONGAP 6 01/21/2020   GFR 95.72 03/21/2019   Lab Results  Component Value Date   CHOL 166 04/04/2019   Lab Results  Component Value Date   HDL 48 04/04/2019   Lab Results  Component Value Date   LDLCALC 103 (H) 04/04/2019   Lab Results  Component Value Date   TRIG 135 11/15/2019   Lab Results  Component Value Date   CHOLHDL 3.5 04/04/2019   Lab Results  Component Value Date   HGBA1C 5.6 02/05/2020      Assessment & Plan:  Kathy Irwin was seen today for diabetes.  Diagnoses and all orders for this visit:  Type 2 diabetes mellitus without complication, without long-term current use of insulin (  HCC) A1c has improved to 5.6 per ADA guidelines now diet classified as a diabetic or prediabetic.  Reviewed medication and discontinue Amaryl 4 mg daily.  Currently has at 1000 mg of Metformin taking twice daily this can be reduced to 500 mg twice daily.  Grade 1 tablet into halves and taking one half a.m. and p.m. -     HgB A1c -     Glucose (CBG) -     Microalbumin/Creatinine Ratio, Urine -     pregabalin (LYRICA) 100 MG capsule; Take 1 capsule (100 mg total) by mouth at bedtime.  Comprehensive diabetic foot examination, type 2 DM, encounter for [facility]) Completed    Colon cancer screening Recommended at the age of 55 referred to gastrology for colonoscopy  Encounter for screening mammogram for malignant neoplasm of breast -    Future -     MM 3D SCREEN BREAST BILATERAL; Future  Neck pain -     methocarbamol (ROBAXIN) 500 MG tablet; Take 1 tablet (500 mg total) by mouth every 8 (eight) hours as needed for muscle spasms.  Other orders -     fluticasone (FLONASE) 50 MCG/ACT nasal spray; Place 2 sprays into both nostrils daily as needed for allergies or rhinitis. -     metoprolol succinate (TOPROL-XL) 25 MG 24 hr tablet; Take 1 tablet (25 mg total) by mouth daily. -     furosemide (LASIX) 20 MG tablet; Take 1 pill in the morning    Meds ordered this encounter  Medications  . fluticasone (FLONASE) 50 MCG/ACT nasal spray    Sig: Place 2 sprays into both nostrils daily as needed for allergies or rhinitis.    Dispense:  18.2 mL    Refill:  3  . metoprolol succinate (TOPROL-XL) 25 MG 24 hr tablet    Sig: Take 1 tablet (25 mg total) by mouth daily.    Dispense:  90 tablet    Refill:  1  . furosemide (LASIX) 20 MG tablet    Sig: Take 1 pill in the morning    Dispense:  90 tablet    Refill:  1  . methocarbamol (ROBAXIN) 500 MG tablet    Sig: Take 1 tablet (500 mg total) by mouth every 8 (eight) hours as needed for muscle spasms.    Dispense:  30 tablet    Refill:  1  . pregabalin (LYRICA) 100 MG capsule    Sig: Take 1 capsule (100 mg total) by mouth at bedtime.    Dispense:  90 capsule    Refill:  0    Follow-up: Return in about 3 months (around 05/07/2020) for right ear irrigation than PCP 3 months.    Kerin Perna, NP

## 2020-02-07 ENCOUNTER — Other Ambulatory Visit (INDEPENDENT_AMBULATORY_CARE_PROVIDER_SITE_OTHER): Payer: Self-pay | Admitting: Primary Care

## 2020-02-11 ENCOUNTER — Ambulatory Visit (INDEPENDENT_AMBULATORY_CARE_PROVIDER_SITE_OTHER): Payer: Medicare Other | Admitting: Primary Care

## 2020-02-11 ENCOUNTER — Encounter (INDEPENDENT_AMBULATORY_CARE_PROVIDER_SITE_OTHER): Payer: Self-pay | Admitting: Primary Care

## 2020-02-11 ENCOUNTER — Other Ambulatory Visit: Payer: Self-pay

## 2020-02-11 VITALS — BP 131/81 | HR 91 | Temp 95.7°F | Ht 63.0 in | Wt 242.2 lb

## 2020-02-11 DIAGNOSIS — J9611 Chronic respiratory failure with hypoxia: Secondary | ICD-10-CM

## 2020-02-11 DIAGNOSIS — H6123 Impacted cerumen, bilateral: Secondary | ICD-10-CM

## 2020-02-11 MED ORDER — ALBUTEROL SULFATE HFA 108 (90 BASE) MCG/ACT IN AERS: 1.0000 | INHALATION_SPRAY | Freq: Four times a day (QID) | RESPIRATORY_TRACT | 1 refills | Status: DC | PRN

## 2020-02-11 NOTE — Patient Instructions (Signed)

## 2020-02-11 NOTE — Progress Notes (Signed)
Established Patient Office Visit  Subjective:  Patient ID: Kathy Irwin, female    DOB: 16-Feb-1951  Age: 69 y.o. MRN: [id number]  CC:  Chief Complaint  Patient presents with  . Cerumen Impaction    HPI Ms.  Bev Drennen is a 69 year old presents for cerumen impaction.   Past Medical History:  Diagnosis Date  . Acute respiratory failure with hypoxia ([place]) 12/2018  . Diabetes mellitus without complication ([place])   . Hypersensitivity pneumonitis ([place]) 12/19/2017  . Hypertension   . ILD (interstitial lung disease) ([place]) 12/24/2018    Past Surgical History:  Procedure Laterality Date  . ABDOMINAL HYSTERECTOMY    . VIDEO BRONCHOSCOPY Bilateral 04/02/2019   Procedure: VIDEO BRONCHOSCOPY WITH FLUORO;  Surgeon: Marshell Garfinkel, MD;  Location: [facility];  Service: Cardiopulmonary;  Laterality: Bilateral;    Family History  Problem Relation Age of Onset  . Diabetes Other   . Hypertension Other     Social History   Socioeconomic History  . Marital status: Married    Spouse name: Not on file  . Number of children: Not on file  . Years of education: Not on file  . Highest education level: Not on file  Occupational History  . Not on file  Tobacco Use  . Smoking status: Never Smoker  . Smokeless tobacco: Never Used  Vaping Use  . Vaping Use: Never used  Substance and Sexual Activity  . Alcohol use: Never  . Drug use: Never  . Sexual activity: Not on file  Other Topics Concern  . Not on file  Social History Narrative  . Not on file   Social Determinants of Health   Financial Resource Strain:   . Difficulty of Paying Living Expenses: Not on file  Food Insecurity:   . Worried About Charity fundraiser in the Last Year: Not on file  . Ran Out of Food in the Last Year: Not on file  Transportation Needs:   . Lack of Transportation (Medical): Not on file  . Lack of Transportation (Non-Medical): Not on file  Physical Activity:   . Days of Exercise per Week: Not on file   . Minutes of Exercise per Session: Not on file  Stress:   . Feeling of Stress : Not on file  Social Connections:   . Frequency of Communication with Friends and Family: Not on file  . Frequency of Social Gatherings with Friends and Family: Not on file  . Attends Religious Services: Not on file  . Active Member of Clubs or Organizations: Not on file  . Attends Archivist Meetings: Not on file  . Marital Status: Not on file  Intimate Partner Violence:   . Fear of Current or Ex-Partner: Not on file  . Emotionally Abused: Not on file  . Physically Abused: Not on file  . Sexually Abused: Not on file    Outpatient Medications Prior to Visit  Medication Sig Dispense Refill  . acetaminophen (TYLENOL) 500 MG tablet Take 1,000 mg by mouth 2 (two) times daily as needed (for pain).    . blood glucose meter kit and supplies KIT Dispense based on patient and insurance preference. Use up to four times daily as directed. (FOR ICD-9 250.00, 250.01). For QAC - HS accuchecks. 1 each 1  . blood glucose meter kit and supplies Dispense based on patient and insurance preference. Use up to four times daily as directed. (FOR ICD-10 E10.9, E11.9). 1 each 0  . [facility] D3 125 MCG (5000  UT) capsule Take 5,000 Units by mouth daily.    . Ensure Max Protein (ENSURE MAX PROTEIN) LIQD Take 330 mLs (11 oz total) by mouth 3 (three) times daily.    . famotidine (PEPCID) 40 MG tablet Take 1 tablet (40 mg total) by mouth daily.    . fluticasone (FLONASE) 50 MCG/ACT nasal spray Place 2 sprays into both nostrils daily as needed for allergies or rhinitis. 18.2 mL 3  . furosemide (LASIX) 20 MG tablet Take 1 pill in the morning 90 tablet 1  . glucose blood (FREESTYLE LITE) test strip For glucose testing every before meals at bedtime. Diagnosis E 11.65  Can substitute to any accepted brand 100 each 0  . linaclotide (LINZESS) 145 MCG CAPS capsule Take 1 capsule (145 mcg total) by mouth daily before breakfast.    .  magnesium gluconate (MAGONATE) 30 MG tablet Take 1 tablet (30 mg total) by mouth 2 (two) times daily. 180 tablet 1  . metFORMIN (GLUCOPHAGE) 1000 MG tablet Take 1 tablet (1,000 mg total) by mouth 2 (two) times daily with a meal. 180 tablet 3  . methocarbamol (ROBAXIN) 500 MG tablet Take 1 tablet (500 mg total) by mouth every 8 (eight) hours as needed for muscle spasms. 30 tablet 1  . metoprolol succinate (TOPROL-XL) 25 MG 24 hr tablet Take 1 tablet (25 mg total) by mouth daily. 90 tablet 1  . metoprolol succinate (TOPROL-XL) 50 MG 24 hr tablet TAKE 1 TABLET BY MOUTH EVERY MORNING 30 tablet 1  . Multiple Vitamin (MULTIVITAMIN WITH MINERALS) TABS tablet Take 1 tablet by mouth daily.    Marland Kitchen olopatadine (PATANOL) 0.1 % ophthalmic solution Place 1 drop into both eyes 2 (two) times daily. (Patient taking differently: Place 1 drop into both eyes 2 (two) times daily as needed for allergies. ) 5 mL 0  . ondansetron (ZOFRAN) 4 MG tablet Take 1 tablet (4 mg total) by mouth every 8 (eight) hours as needed for nausea or vomiting. 12 tablet 0  . pantoprazole (PROTONIX) 40 MG tablet Take 1 tablet (40 mg total) by mouth daily. 60 tablet 0  . polyethylene glycol (MIRALAX / GLYCOLAX) 17 g packet Take 17 g by mouth daily as needed for mild constipation or moderate constipation.  0  . pravastatin (PRAVACHOL) 40 MG tablet Take 1 tablet (40 mg total) by mouth at bedtime. 90 tablet 1  . pregabalin (LYRICA) 100 MG capsule Take 1 capsule (100 mg total) by mouth at bedtime. 90 capsule 0  . senna-docusate (SENOKOT-S) 8.6-50 MG tablet Take 1 tablet by mouth 2 (two) times daily.    . sertraline (ZOLOFT) 100 MG tablet Take 1 pill at bedtime 90 tablet 1  . simethicone (MYLICON) 80 MG chewable tablet Chew 1 tablet (80 mg total) by mouth 4 (four) times daily as needed for flatulence.    . sodium chloride (OCEAN) 0.65 % SOLN nasal spray Place 1 spray into both nostrils as needed for congestion. 15 mL 0  . tamsulosin (FLOMAX) 0.4 MG  CAPS capsule Take 1 capsule (0.4 mg total) by mouth daily. 90 capsule 1  . albuterol (VENTOLIN HFA) 108 (90 Base) MCG/ACT inhaler Inhale 1-2 puffs into the lungs every 6 (six) hours as needed for wheezing or shortness of breath. 8 g 0   No facility-administered medications prior to visit.    Allergies  Allergen Reactions  . Other Shortness Of Breath and Other (See Comments)    Newspaper ink =  new chest pain, also  .  Iodine Other (See Comments)    "Was a long time ago" (Reaction??)  . Merbromin Other (See Comments)    Mercurochrome- "Was a long time ago" (Reaction??)  . Tape     ROS Review of Systems  HENT:       Cerumen impaction   All other systems reviewed and are negative.     Objective:    Physical Exam Vitals reviewed.  Constitutional:      Appearance: She is obese.  HENT:     Right Ear: There is impacted cerumen.     Left Ear: There is impacted cerumen.  Cardiovascular:     Rate and Rhythm: Normal rate and regular rhythm.  Pulmonary:     Effort: Pulmonary effort is normal.     Breath sounds: Normal breath sounds.  Abdominal:     General: Bowel sounds are normal. There is distension.  Musculoskeletal:        General: Normal range of motion.     Cervical back: Normal range of motion.  Skin:    General: Skin is warm and dry.  Neurological:     Mental Status: She is alert and oriented to person, place, and time.  Psychiatric:        Mood and Affect: Mood normal.        Behavior: Behavior normal.        Thought Content: Thought content normal.        Judgment: Judgment normal.     BP 131/81 (BP Location: Left Arm, Patient Position: Sitting, Cuff Size: Large)   Pulse 91   Temp (!) 95.7 F (35.4 C) (Temporal)   Ht _0  (1.6 m)   Wt 242 lb 3.2 oz (109.9 kg)   SpO2 90%   BMI 42.90 kg/m  Wt Readings from Last 3 Encounters:  02/11/20 242 lb 3.2 oz (109.9 kg)  02/05/20 244 lb 6.4 oz (110.9 kg)  01/21/20 244 lb 14.4 oz (111.1 kg)     Health  Maintenance Due  Topic Date Due  . Hepatitis C Screening  Never done  . URINE MICROALBUMIN  Never done  . MAMMOGRAM  Never done  . COLONOSCOPY  Never done  . PNA vac Low Risk Adult (1 of 2 - PCV13) Never done    There are no preventive care reminders to display for this patient.  Lab Results  Component Value Date   TSH 2.060 04/04/2019   Lab Results  Component Value Date   WBC 5.8 01/21/2020   HGB 11.5 (L) 01/21/2020   HCT 35.7 (L) 01/21/2020   MCV 97.5 01/21/2020   PLT 64 (L) 01/21/2020   Lab Results  Component Value Date   NA 144 01/21/2020   K 3.7 01/21/2020   CO2 29 01/21/2020   GLUCOSE 102 (H) 01/21/2020   BUN 9 01/21/2020   CREATININE 0.70 01/21/2020   BILITOT 1.2 01/21/2020   ALKPHOS 62 01/21/2020   AST 20 01/21/2020   ALT 12 01/21/2020   PROT 7.3 01/21/2020   ALBUMIN 4.0 01/21/2020   CALCIUM 10.2 01/21/2020   ANIONGAP 6 01/21/2020   GFR 95.72 03/21/2019   Lab Results  Component Value Date   CHOL 166 04/04/2019   Lab Results  Component Value Date   HDL 48 04/04/2019   Lab Results  Component Value Date   LDLCALC 103 (H) 04/04/2019   Lab Results  Component Value Date   TRIG 135 11/15/2019   Lab Results  Component Value Date   CHOLHDL 3.5  04/04/2019   Lab Results  Component Value Date   HGBA1C 5.6 02/05/2020      Assessment & Plan:  Laurelin was seen today for cerumen impaction. Diagnoses and all orders for this visit:  Chronic respiratory failure with hypoxia (HCC) -     albuterol (VENTOLIN HFA) 108 (90 Base) MCG/ACT inhaler; Inhale 1-2 puffs into the lungs every 6 (six) hours as needed for wheezing or shortness of breath. -     Ambulatory referral to Pulmonology  Bilateral impacted cerumen  Bilateral impacted cerumen   PROCEDURE: CERUMEN DISIMPACTION    The patient had a large amount of cerumen in the external auditory canal(s): bilateral  Ear wax softener was used prior to the lavage.  Ear lavage was performed on bilateral  ear(s)   There were no complications and following the disimpaction the tympanic membrane was visible.  Charges to be entered in Charge Capture section.     Follow-up: No follow-ups on file.    Kerin Perna, NP

## 2020-02-23 ENCOUNTER — Encounter (INDEPENDENT_AMBULATORY_CARE_PROVIDER_SITE_OTHER): Payer: Self-pay | Admitting: Primary Care

## 2020-03-03 ENCOUNTER — Other Ambulatory Visit (INDEPENDENT_AMBULATORY_CARE_PROVIDER_SITE_OTHER): Payer: Self-pay | Admitting: Primary Care

## 2020-03-17 ENCOUNTER — Ambulatory Visit (HOSPITAL_COMMUNITY)
Admission: EM | Admit: 2020-03-17 | Discharge: 2020-03-17 | Disposition: A | Discharge status: Home / Self Care | Payer: Medicare Other | Attending: Emergency Medicine | Admitting: Emergency Medicine

## 2020-03-17 ENCOUNTER — Other Ambulatory Visit: Payer: Self-pay

## 2020-03-17 ENCOUNTER — Ambulatory Visit
Admission: RE | Admit: 2020-03-17 | Discharge: 2020-03-17 | Disposition: A | Discharge status: Home / Self Care | Payer: Medicare Other | Source: Ambulatory Visit | Attending: Primary Care | Admitting: Primary Care

## 2020-03-17 ENCOUNTER — Encounter (HOSPITAL_COMMUNITY): Payer: Self-pay

## 2020-03-17 DIAGNOSIS — R059 Cough, unspecified: Secondary | ICD-10-CM

## 2020-03-17 DIAGNOSIS — Z1231 Encounter for screening mammogram for malignant neoplasm of breast: Secondary | ICD-10-CM

## 2020-03-17 MED ORDER — CETIRIZINE HCL 10 MG PO TABS: 10.0000 mg | ORAL_TABLET | Freq: Every day | ORAL | 0 refills | Status: DC

## 2020-03-17 MED ORDER — BENZONATATE 100 MG PO CAPS: 100.0000 mg | ORAL_CAPSULE | Freq: Three times a day (TID) | ORAL | 0 refills | Status: AC | PRN

## 2020-03-17 NOTE — ED Provider Notes (Signed)
_______________________________________  Time seen: Approximately 7:37 PM  I have reviewed the triage vital signs and the nursing notes.   HISTORY  Chief Complaint Cough and URI   Historian Patient    HPI Kathy Irwin is a 69 y.o. female history of interstitial lung disease, hypertension, diabetes, chronic diastolic CHF and chronic respiratory failure, presents to the urgent care with productive cough for the past 2-3 days.  Patient states that she has been breathless at home but states that she is breathless at baseline.  She denies fever, chills, emesis, diarrhea or body aches.  Patient states that she has had some nasal congestion and she is worried that she is having postnasal drip secondary to allergies.  She denies weight gain and has not been using excessive pillows to prop herself up at night.  She takes 20 mg of Lasix daily.  She adamantly denies chest pain or chest tightness.  No other alleviating measures have been attempted.  Patient uses 3 L of oxygen at night only.   Past Medical History:  Diagnosis Date  . Acute respiratory failure with hypoxia ([place]) 12/2018  . Diabetes mellitus without complication ([place])   . Hypersensitivity pneumonitis ([place]) 12/19/2017  . Hypertension   . ILD (interstitial lung disease) ([place]) 12/24/2018     Immunizations up to date:  Yes.     Past Medical History:  Diagnosis Date  . Acute respiratory failure with hypoxia ([place]) 12/2018  . Diabetes mellitus without complication ([place])   . Hypersensitivity pneumonitis ([place]) 12/19/2017  . Hypertension   . ILD (interstitial lung disease) ([place]) 12/24/2018    Patient Active Problem List   Diagnosis Date Noted  . Cellulitis of right lower leg 11/09/2019  . Sepsis ([place]) 11/09/2019  . Chronic diastolic CHF (congestive heart failure) ([place]) 08/02/2019  . Chronic respiratory failure with hypoxia ([place]) 08/02/2019  . Acute on chronic respiratory failure with hypoxemia ([place]) 08/02/2019  . Atherosclerosis  of aorta ([place]) 01/23/2019  . Interstitial lung disease ([place]) 12/24/2018  . Obesity, Class III, BMI 40-49.9 (morbid obesity) ([place]) 12/24/2018  . Pneumonia due to COVID-19 virus 11/15/2018  . Acute respiratory failure ([place]) 12/19/2017  . Hypersensitivity pneumonitis ([place]) 12/19/2017  . Thrombocytopenia ([place]) 12/19/2017  . Insulin-requiring or dependent type II diabetes mellitus ([place]) 12/17/2017  . HTN (hypertension) 12/17/2017  . Essential hypertension 11/05/2017  . 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 ABDOMINAL HYSTERECTOMY    . VIDEO BRONCHOSCOPY Bilateral 04/02/2019   Procedure: VIDEO BRONCHOSCOPY WITH FLUORO;  Surgeon: Marshell Garfinkel, MD;  Location: [facility];  Service: Cardiopulmonary;  Laterality: Bilateral;    Prior to Admission medications   Medication Sig Start Date End Date Taking? Authorizing Provider  acetaminophen (TYLENOL) 500 MG tablet Take 1,000 mg by mouth 2 (two) times daily as needed (for pain).    [provider]  albuterol (VENTOLIN HFA) 108 (90 Base) MCG/ACT inhaler Inhale 1-2 puffs into the lungs every 6 (six) hours as needed for wheezing or shortness of breath. 02/11/20   Kerin Perna, NP  benzonatate (TESSALON PERLES) 100 MG capsule Take 1 capsule (100 mg total) by mouth 3 (three) times daily as needed for up to 7 days for cough. 03/17/20 03/24/20  Lannie Fields, PA-C  blood glucose meter kit and supplies KIT Dispense based on patient and insurance preference. Use up to four times daily as directed. (FOR ICD-9 250.00, 250.01). For QAC - HS accuchecks. 11/22/18   Lala Lund  K, MD  blood glucose meter kit and supplies Dispense based on patient and insurance preference. Use up to four times daily as directed. (FOR ICD-10 E10.9, E11.9). 12/30/19   Kerin Perna, NP  cetirizine (ZYRTEC ALLERGY) 10 MG tablet Take 1 tablet (10 mg total) by mouth daily for 7 days. 03/17/20 03/24/20  Lannie Fields, PA-C  CVS D3 125 MCG (5000 UT) capsule Take 5,000 Units by mouth daily. 07/16/19   [provider]  Ensure Max Protein (ENSURE MAX PROTEIN) LIQD Take 330 mLs (11 oz total) by mouth 3 (three) times daily. 12/01/19   Pokhrel, Corrie Mckusick, MD  famotidine (PEPCID) 40 MG tablet Take 1 tablet (40 mg total) by mouth daily. 12/02/19   Pokhrel, Corrie Mckusick, MD  fluticasone (FLONASE) 50 MCG/ACT nasal spray Place 2 sprays into both nostrils daily as needed for allergies or rhinitis. 02/05/20   Kerin Perna, NP  furosemide (LASIX) 20 MG tablet Take 1 pill in the morning 02/05/20   Kerin Perna, NP  glucose blood (FREESTYLE LITE) test strip For glucose testing every before meals at bedtime. Diagnosis E 11.65  Can substitute to any accepted brand 11/22/18   Thurnell Lose, MD  linaclotide [facility]) 145 MCG CAPS capsule Take 1 capsule (145 mcg total) by mouth daily before breakfast. 12/02/19   Pokhrel, Corrie Mckusick, MD  magnesium gluconate (MAGONATE) 30 MG tablet Take 1 tablet (30 mg total) by mouth 2 (two) times daily. 01/01/20   Kerin Perna, NP  metFORMIN (GLUCOPHAGE) 1000 MG tablet Take 1 tablet (1,000 mg total) by mouth 2 (two) times daily with a meal. 12/30/19   Kerin Perna, NP  methocarbamol (ROBAXIN) 500 MG tablet Take 1 tablet (500 mg total) by mouth every 8 (eight) hours as needed for muscle spasms. 02/05/20   Kerin Perna, NP  metoprolol succinate (TOPROL-XL) 25 MG 24 hr tablet Take 1 tablet (25 mg total) by mouth daily. 02/05/20   Kerin Perna, NP  metoprolol succinate (TOPROL-XL) 50 MG 24 hr tablet TAKE 1 TABLET BY MOUTH EVERY DAY IN THE MORNING 03/03/20   Kerin Perna, NP  Multiple Vitamin (MULTIVITAMIN WITH MINERALS) TABS tablet Take 1 tablet by mouth daily. 12/02/19   Pokhrel, Corrie Mckusick, MD  olopatadine (PATANOL) 0.1 % ophthalmic solution Place 1 drop into both eyes 2 (two) times daily. Patient taking differently: Place 1 drop into both eyes 2 (two) times  daily as needed for allergies.  07/24/19   Wieters, Hallie C, PA-C  ondansetron (ZOFRAN) 4 MG tablet Take 1 tablet (4 mg total) by mouth every 8 (eight) hours as needed for nausea or vomiting. 08/15/19   Rancour, Annie Main, MD  pantoprazole (PROTONIX) 40 MG tablet Take 1 tablet (40 mg total) by mouth daily. 12/01/19   Pokhrel, Corrie Mckusick, MD  polyethylene glycol (MIRALAX / GLYCOLAX) 17 g packet Take 17 g by mouth daily as needed for mild constipation or moderate constipation. 12/01/19   Pokhrel, Corrie Mckusick, MD  pravastatin (PRAVACHOL) 40 MG tablet Take 1 tablet (40 mg total) by mouth at bedtime. 12/30/19   Kerin Perna, NP  pregabalin (LYRICA) 100 MG capsule Take 1 capsule (100 mg total) by mouth at bedtime. 02/05/20   Kerin Perna, NP  senna-docusate (SENOKOT-S) 8.6-50 MG tablet Take 1 tablet by mouth 2 (two) times daily. 11/24/19   Domenic Polite, MD  sertraline (ZOLOFT) 100 MG tablet Take 1 pill at bedtime 12/30/19   Kerin Perna, NP  simethicone (MYLICON) 80 MG chewable  tablet Chew 1 tablet (80 mg total) by mouth 4 (four) times daily as needed for flatulence. 12/01/19   Pokhrel, Corrie Mckusick, MD  sodium chloride (OCEAN) 0.65 % SOLN nasal spray Place 1 spray into both nostrils as needed for congestion. 08/12/19   Dessa Phi, DO  tamsulosin (FLOMAX) 0.4 MG CAPS capsule Take 1 capsule (0.4 mg total) by mouth daily. 12/30/19   Kerin Perna, NP    Allergies Other, Iodine, Merbromin, and Tape  Family History  Problem Relation Age of Onset  . Diabetes Other   . Hypertension Other     Social History Social History   Tobacco Use  . Smoking status: Never Smoker  . Smokeless tobacco: Never Used  Vaping Use  . Vaping Use: Never used  Substance Use Topics  . Alcohol use: Never  . Drug use: Never     Review of Systems  Constitutional: No fever/chills Eyes:  No discharge ENT: No upper respiratory complaints. Respiratory: Patient has cough.  Gastrointestinal:   No nausea, no  vomiting.  No diarrhea.  No constipation. Musculoskeletal: Negative for musculoskeletal pain. Skin: Negative for rash, abrasions, lacerations, ecchymosis.    ____________________________________________   PHYSICAL EXAM:  VITAL SIGNS: ED Triage Vitals  Enc Vitals Group     BP 03/17/20 1853 140/74     Pulse Rate 03/17/20 1853 (!) 104     Resp 03/17/20 1853 17     Temp 03/17/20 1853 98.4 F (36.9 C)     Temp Source 03/17/20 1853 Oral     SpO2 03/17/20 1853 100 %     Weight --      Height --      Head Circumference --      Peak Flow --      Pain Score 03/17/20 1852 4     Pain Loc --      Pain Edu? --      Excl. in [place]? --      Constitutional: Alert and oriented. Well appearing and in no acute distress. Eyes: Conjunctivae are normal. PERRL. EOMI. Head: Atraumatic. ENT:      Ears: TMs are pearly.       Nose: No congestion/rhinnorhea.      Mouth/Throat: Mucous membranes are moist.  Neck: No stridor.  No cervical spine tenderness to palpation. Cardiovascular: Normal rate, regular rhythm. Normal S1 and S2.  Good peripheral circulation. Respiratory: Normal respiratory effort without tachypnea or retractions.  Patient has crackles auscultated on the left good air entry to the bases with no decreased or absent breath sounds Gastrointestinal: Bowel sounds x 4 quadrants. Soft and nontender to palpation. No guarding or rigidity. No distention. Musculoskeletal: Full range of motion to all extremities. No obvious deformities noted Neurologic:  Normal for age. No gross focal neurologic deficits are appreciated.  Skin:  Skin is warm, dry and intact. No rash noted. Psychiatric: Mood and affect are normal for age. Speech and behavior are normal.   ____________________________________________   LABS (all labs ordered are listed, but only abnormal results are displayed)  Labs Reviewed - No data to  display ____________________________________________  EKG   ____________________________________________  RADIOLOGY   No results found.  ____________________________________________    PROCEDURES  Procedure(s) performed:     Procedures     Medications - No data to display   ____________________________________________   INITIAL IMPRESSION / ASSESSMENT AND PLAN / ED COURSE  Pertinent labs & imaging results that were available during my care of the patient were reviewed by me  and considered in my medical decision making (see chart for details).      Assessment and plan Cough 69 year old female with a history of diastolic CHF presents to the urgent care with productive cough for the past 2 to 3 days and some baseline breathlessness.  Patient was mildly tachycardic at triage but vital signs were otherwise reassuring.  Patient was able to speak in complete sentence and was satting at 100% on room air.  She did have crackles auscultated on the left.  She had no pitting edema at the bilateral ankles and no weeping of serosanguineous exudate.  I am concerned that patient is experiencing an early CHF exacerbation.  Patient takes 20 mg of Lasix daily.  Will increase patient's Lasix to 40 mg daily for the next 3 days to assess whether patient improves symptomatically.  Patient requested cough medicine and was started on Zyrtec and Tessalon Perles per her request, although I think cough is secondary to CHF.  I discussed seeking care at local emergency department for more exhaustive work-up than what we can do here in the urgent care and patient stated that she would like to treat her symptoms conservatively at home at least initially.  I cautioned patient that should she experience worsening shortness of breath, chest tightness or chest pain, she needed to seek care at the emergency department immediately.  She voiced understanding and has easy access to the emergency department  should symptoms change or worsen. Patient's case was discussed with attending Dr. Mannie Stabile who agrees with plan of care.    ____________________________________________  FINAL CLINICAL IMPRESSION(S) / ED DIAGNOSES  Final diagnoses:  Cough      NEW MEDICATIONS STARTED DURING THIS VISIT:  ED Discharge Orders         Ordered    benzonatate (TESSALON PERLES) 100 MG capsule  3 times daily PRN        03/17/20 1922    cetirizine (ZYRTEC ALLERGY) 10 MG tablet  Daily        03/17/20 1922              This chart was dictated using voice recognition software/Dragon. Despite best efforts to proofread, errors can occur which can change the meaning. Any change was purely unintentional.     Lannie Fields, PA-C 03/17/20 1946

## 2020-03-17 NOTE — ED Triage Notes (Signed)
Pt presents with ongoing non productive cough, ear discomfort, and nasal drainage for over a week; pt believes it may be related to her allergies.

## 2020-03-17 NOTE — Discharge Instructions (Signed)
Please take 20 mg of your Lasix in the morning and 20 mg at night for the next 3 days.  After 3 days, go back to using 20 mg of Lasix daily. You can use Tessalon Perles and Zyrtec as directed. If you experience worsening shortness of breath, chest tightness or chest pain, please go to emergency department immediately.

## 2020-03-22 ENCOUNTER — Other Ambulatory Visit (INDEPENDENT_AMBULATORY_CARE_PROVIDER_SITE_OTHER): Payer: Self-pay | Admitting: Primary Care

## 2020-03-22 MED ORDER — TAMSULOSIN HCL 0.4 MG PO CAPS: 0.4000 mg | ORAL_CAPSULE | Freq: Every day | ORAL | 1 refills | Status: DC

## 2020-03-22 NOTE — Telephone Encounter (Signed)
Medication Refill - Medication: tamsulosin   Has the patient contacted their pharmacy? Yes.   (Agent: If no, request that the patient contact the pharmacy for the refill.) (Agent: If yes, when and what did the pharmacy advise?)  Preferred Pharmacy (with phone number or street name):  [id number] [place] [address] [id number] Phone: [phone]: [phone] Hours: Open 24 hours     Agent: Please be advised that RX refills may take up to 3 business days. We ask that you follow-up with your pharmacy.

## 2020-03-22 NOTE — Telephone Encounter (Signed)
Patient checking on the status of medication request mentioned below, patient would like a follow up call when completed.

## 2020-03-22 NOTE — Addendum Note (Signed)
Addended by: Jefferson Fuel on: 03/22/2020 02:09 PM   Modules accepted: Orders

## 2020-03-23 NOTE — Telephone Encounter (Signed)
PT calling to F/UP on this request

## 2020-03-24 ENCOUNTER — Other Ambulatory Visit: Payer: Self-pay | Admitting: Primary Care

## 2020-03-24 DIAGNOSIS — R928 Other abnormal and inconclusive findings on diagnostic imaging of breast: Secondary | ICD-10-CM

## 2020-03-29 ENCOUNTER — Other Ambulatory Visit (INDEPENDENT_AMBULATORY_CARE_PROVIDER_SITE_OTHER): Payer: Self-pay | Admitting: Primary Care

## 2020-03-29 NOTE — Telephone Encounter (Signed)
Requested medication (s) are due for refill today: yes  Requested medication (s) are on the active medication list: yes  Last refill: 03/03/20  #30  1 refill  Future visit scheduled: yes  Notes to clinic:  Patient has metoprolol 25xl and metoprolol 50xl on med list? Is she taking 75 total? I could not find documentation. Please review.    Requested Prescriptions  Pending Prescriptions Disp Refills   metoprolol succinate (TOPROL-XL) 50 MG 24 hr tablet [Pharmacy Med Name: METOPROLOL SUCC ER 50 MG TAB] 30 tablet 1    Sig: TAKE 1 TABLET BY MOUTH EVERY DAY IN THE MORNING      Cardiovascular:  Beta Blockers Failed - 03/29/2020  2:32 PM      Failed - Last BP in normal range    BP Readings from Last 1 Encounters:  03/17/20 140/74          Passed - Last Heart Rate in normal range    Pulse Readings from Last 1 Encounters:  03/17/20 (!) 104          Passed - Valid encounter within last 6 months    Recent Outpatient Visits           1 month ago Chronic respiratory failure with hypoxia ([place])   [place] Juluis Mire P, NP   1 month ago Type 2 diabetes mellitus without complication, without long-term current use of insulin ([place])   [place] Kerin Perna, NP   3 months ago Hospital discharge follow-up   [place], [place]is[place] P, NP   4 months ago Type 2 diabetes mellitus without complication, without long-term current use of insulin ([place])   [place] Juluis Mire P, NP   6 months ago Type 2 diabetes mellitus without complication, without long-term current use of insulin ([place])   [place], Grady, NP       Future Appointments             In 1 month Oletta Lamas, Milford Cage, NP [place]

## 2020-04-07 ENCOUNTER — Other Ambulatory Visit: Payer: Self-pay | Admitting: Primary Care

## 2020-04-07 ENCOUNTER — Ambulatory Visit
Admission: RE | Admit: 2020-04-07 | Discharge: 2020-04-07 | Disposition: A | Discharge status: Home / Self Care | Payer: Medicare Other | Source: Ambulatory Visit | Attending: Primary Care | Admitting: Primary Care

## 2020-04-07 ENCOUNTER — Other Ambulatory Visit: Payer: Self-pay

## 2020-04-07 DIAGNOSIS — R928 Other abnormal and inconclusive findings on diagnostic imaging of breast: Secondary | ICD-10-CM

## 2020-04-07 DIAGNOSIS — R921 Mammographic calcification found on diagnostic imaging of breast: Secondary | ICD-10-CM

## 2020-04-20 ENCOUNTER — Ambulatory Visit
Admission: RE | Admit: 2020-04-20 | Discharge: 2020-04-20 | Disposition: A | Discharge status: Home / Self Care | Payer: Medicare Other | Source: Ambulatory Visit | Attending: Primary Care | Admitting: Primary Care

## 2020-04-20 ENCOUNTER — Other Ambulatory Visit: Payer: Self-pay

## 2020-04-20 DIAGNOSIS — R921 Mammographic calcification found on diagnostic imaging of breast: Secondary | ICD-10-CM

## 2020-04-26 ENCOUNTER — Other Ambulatory Visit: Payer: Self-pay

## 2020-04-26 ENCOUNTER — Telehealth (INDEPENDENT_AMBULATORY_CARE_PROVIDER_SITE_OTHER): Payer: 59 | Admitting: Primary Care

## 2020-04-26 DIAGNOSIS — N3281 Overactive bladder: Secondary | ICD-10-CM

## 2020-04-26 MED ORDER — OXYBUTYNIN CHLORIDE 5 MG PO TABS: 5.0000 mg | ORAL_TABLET | Freq: Three times a day (TID) | ORAL | 0 refills | Status: DC

## 2020-04-26 NOTE — Progress Notes (Signed)
I connected with  Kathy Irwin on 04/26/20 by a video enabled telemedicine application and verified that I am speaking with the correct person using two identifiers.   I discussed the limitations of evaluation and management by telemedicine. The patient expressed understanding and agreed to proceed.    Pt states there is no urge to urinate when she stands urine just flows  Urinating 5-6 times at night

## 2020-04-29 ENCOUNTER — Inpatient Hospital Stay (HOSPITAL_COMMUNITY)
Admission: EM | Admit: 2020-04-29 | Discharge: 2020-05-11 | DRG: 480 | Disposition: A | Discharge status: Skilled Nursing Facility | Payer: 59 | Attending: Internal Medicine | Admitting: Internal Medicine

## 2020-04-29 ENCOUNTER — Emergency Department (HOSPITAL_COMMUNITY): Payer: 59

## 2020-04-29 ENCOUNTER — Encounter (HOSPITAL_COMMUNITY): Payer: Self-pay | Admitting: Internal Medicine

## 2020-04-29 ENCOUNTER — Other Ambulatory Visit: Payer: Self-pay

## 2020-04-29 DIAGNOSIS — E785 Hyperlipidemia, unspecified: Secondary | ICD-10-CM | POA: Diagnosis present

## 2020-04-29 DIAGNOSIS — Z6841 Body Mass Index (BMI) 40.0 and over, adult: Secondary | ICD-10-CM | POA: Diagnosis not present

## 2020-04-29 DIAGNOSIS — S7290XA Unspecified fracture of unspecified femur, initial encounter for closed fracture: Secondary | ICD-10-CM

## 2020-04-29 DIAGNOSIS — D62 Acute posthemorrhagic anemia: Secondary | ICD-10-CM | POA: Diagnosis not present

## 2020-04-29 DIAGNOSIS — W1809XA Striking against other object with subsequent fall, initial encounter: Secondary | ICD-10-CM | POA: Diagnosis present

## 2020-04-29 DIAGNOSIS — S7221XA Displaced subtrochanteric fracture of right femur, initial encounter for closed fracture: Principal | ICD-10-CM | POA: Diagnosis present

## 2020-04-29 DIAGNOSIS — Z7982 Long term (current) use of aspirin: Secondary | ICD-10-CM | POA: Diagnosis not present

## 2020-04-29 DIAGNOSIS — D539 Nutritional anemia, unspecified: Secondary | ICD-10-CM | POA: Diagnosis present

## 2020-04-29 DIAGNOSIS — Z20822 Contact with and (suspected) exposure to covid-19: Secondary | ICD-10-CM | POA: Diagnosis present

## 2020-04-29 DIAGNOSIS — S72001A Fracture of unspecified part of neck of right femur, initial encounter for closed fracture: Secondary | ICD-10-CM

## 2020-04-29 DIAGNOSIS — G9341 Metabolic encephalopathy: Secondary | ICD-10-CM | POA: Diagnosis not present

## 2020-04-29 DIAGNOSIS — D693 Immune thrombocytopenic purpura: Secondary | ICD-10-CM | POA: Diagnosis present

## 2020-04-29 DIAGNOSIS — N179 Acute kidney failure, unspecified: Secondary | ICD-10-CM | POA: Diagnosis present

## 2020-04-29 DIAGNOSIS — Z833 Family history of diabetes mellitus: Secondary | ICD-10-CM | POA: Diagnosis not present

## 2020-04-29 DIAGNOSIS — E118 Type 2 diabetes mellitus with unspecified complications: Secondary | ICD-10-CM

## 2020-04-29 DIAGNOSIS — E119 Type 2 diabetes mellitus without complications: Secondary | ICD-10-CM | POA: Diagnosis present

## 2020-04-29 DIAGNOSIS — R6520 Severe sepsis without septic shock: Secondary | ICD-10-CM | POA: Diagnosis not present

## 2020-04-29 DIAGNOSIS — G928 Other toxic encephalopathy: Secondary | ICD-10-CM | POA: Diagnosis present

## 2020-04-29 DIAGNOSIS — J9611 Chronic respiratory failure with hypoxia: Secondary | ICD-10-CM | POA: Diagnosis present

## 2020-04-29 DIAGNOSIS — I5033 Acute on chronic diastolic (congestive) heart failure: Secondary | ICD-10-CM | POA: Diagnosis present

## 2020-04-29 DIAGNOSIS — J849 Interstitial pulmonary disease, unspecified: Secondary | ICD-10-CM | POA: Diagnosis present

## 2020-04-29 DIAGNOSIS — A419 Sepsis, unspecified organism: Secondary | ICD-10-CM | POA: Diagnosis not present

## 2020-04-29 DIAGNOSIS — I110 Hypertensive heart disease with heart failure: Secondary | ICD-10-CM | POA: Diagnosis present

## 2020-04-29 DIAGNOSIS — Z91048 Other nonmedicinal substance allergy status: Secondary | ICD-10-CM

## 2020-04-29 DIAGNOSIS — I5032 Chronic diastolic (congestive) heart failure: Secondary | ICD-10-CM | POA: Diagnosis present

## 2020-04-29 DIAGNOSIS — E860 Dehydration: Secondary | ICD-10-CM | POA: Diagnosis present

## 2020-04-29 DIAGNOSIS — C9210 Chronic myeloid leukemia, BCR/ABL-positive, not having achieved remission: Secondary | ICD-10-CM | POA: Diagnosis not present

## 2020-04-29 DIAGNOSIS — J8489 Other specified interstitial pulmonary diseases: Secondary | ICD-10-CM | POA: Diagnosis present

## 2020-04-29 DIAGNOSIS — W19XXXA Unspecified fall, initial encounter: Secondary | ICD-10-CM | POA: Diagnosis present

## 2020-04-29 DIAGNOSIS — J9621 Acute and chronic respiratory failure with hypoxia: Secondary | ICD-10-CM | POA: Diagnosis not present

## 2020-04-29 DIAGNOSIS — J679 Hypersensitivity pneumonitis due to unspecified organic dust: Secondary | ICD-10-CM | POA: Diagnosis present

## 2020-04-29 DIAGNOSIS — Y990 Civilian activity done for income or pay: Secondary | ICD-10-CM | POA: Diagnosis not present

## 2020-04-29 DIAGNOSIS — R0602 Shortness of breath: Secondary | ICD-10-CM

## 2020-04-29 DIAGNOSIS — Z888 Allergy status to other drugs, medicaments and biological substances status: Secondary | ICD-10-CM

## 2020-04-29 DIAGNOSIS — Z7984 Long term (current) use of oral hypoglycemic drugs: Secondary | ICD-10-CM

## 2020-04-29 DIAGNOSIS — K219 Gastro-esophageal reflux disease without esophagitis: Secondary | ICD-10-CM | POA: Diagnosis present

## 2020-04-29 DIAGNOSIS — S72141A Displaced intertrochanteric fracture of right femur, initial encounter for closed fracture: Secondary | ICD-10-CM | POA: Diagnosis not present

## 2020-04-29 DIAGNOSIS — J189 Pneumonia, unspecified organism: Secondary | ICD-10-CM | POA: Diagnosis not present

## 2020-04-29 DIAGNOSIS — Z79899 Other long term (current) drug therapy: Secondary | ICD-10-CM | POA: Diagnosis not present

## 2020-04-29 DIAGNOSIS — R0902 Hypoxemia: Secondary | ICD-10-CM

## 2020-04-29 DIAGNOSIS — Z8249 Family history of ischemic heart disease and other diseases of the circulatory system: Secondary | ICD-10-CM

## 2020-04-29 DIAGNOSIS — I959 Hypotension, unspecified: Secondary | ICD-10-CM | POA: Diagnosis not present

## 2020-04-29 DIAGNOSIS — Z9981 Dependence on supplemental oxygen: Secondary | ICD-10-CM

## 2020-04-29 DIAGNOSIS — I5031 Acute diastolic (congestive) heart failure: Secondary | ICD-10-CM | POA: Diagnosis not present

## 2020-04-29 DIAGNOSIS — C9310 Chronic myelomonocytic leukemia not having achieved remission: Secondary | ICD-10-CM | POA: Diagnosis present

## 2020-04-29 DIAGNOSIS — Z794 Long term (current) use of insulin: Secondary | ICD-10-CM | POA: Diagnosis not present

## 2020-04-29 DIAGNOSIS — D696 Thrombocytopenia, unspecified: Secondary | ICD-10-CM | POA: Diagnosis not present

## 2020-04-29 DIAGNOSIS — J9600 Acute respiratory failure, unspecified whether with hypoxia or hypercapnia: Secondary | ICD-10-CM | POA: Diagnosis present

## 2020-04-29 DIAGNOSIS — R609 Edema, unspecified: Secondary | ICD-10-CM | POA: Diagnosis not present

## 2020-04-29 DIAGNOSIS — T1490XA Injury, unspecified, initial encounter: Secondary | ICD-10-CM

## 2020-04-29 DIAGNOSIS — R000 Tachycardia, unspecified: Secondary | ICD-10-CM | POA: Diagnosis not present

## 2020-04-29 LAB — CBC
HCT: 25.6 % — ABNORMAL LOW (ref 36.0–46.0)
Hemoglobin: 8.2 g/dL — ABNORMAL LOW (ref 12.0–15.0)
MCH: 32.3 pg (ref 26.0–34.0)
MCHC: 32 g/dL (ref 30.0–36.0)
MCV: 100.8 fL — ABNORMAL HIGH (ref 80.0–100.0)
Platelets: 48 10*3/uL — ABNORMAL LOW (ref 150–400)
RBC: 2.54 MIL/uL — ABNORMAL LOW (ref 3.87–5.11)
RDW: 18.9 % — ABNORMAL HIGH (ref 11.5–15.5)
WBC: 8.1 10*3/uL (ref 4.0–10.5)
nRBC: 4.7 % — ABNORMAL HIGH (ref 0.0–0.2)

## 2020-04-29 LAB — CBC WITH DIFFERENTIAL/PLATELET
Abs Immature Granulocytes: 0.71 10*3/uL — ABNORMAL HIGH (ref 0.00–0.07)
Basophils Absolute: 0 10*3/uL (ref 0.0–0.1)
Basophils Relative: 1 %
Eosinophils Absolute: 0 10*3/uL (ref 0.0–0.5)
Eosinophils Relative: 0 %
HCT: 32.7 % — ABNORMAL LOW (ref 36.0–46.0)
Hemoglobin: 10.5 g/dL — ABNORMAL LOW (ref 12.0–15.0)
Immature Granulocytes: 10 %
Lymphocytes Relative: 36 %
Lymphs Abs: 2.5 10*3/uL (ref 0.7–4.0)
MCH: 32.3 pg (ref 26.0–34.0)
MCHC: 32.1 g/dL (ref 30.0–36.0)
MCV: 100.6 fL — ABNORMAL HIGH (ref 80.0–100.0)
Monocytes Absolute: 2.1 10*3/uL — ABNORMAL HIGH (ref 0.1–1.0)
Monocytes Relative: 31 %
Neutro Abs: 1.6 10*3/uL — ABNORMAL LOW (ref 1.7–7.7)
Neutrophils Relative %: 22 %
Platelets: 30 10*3/uL — ABNORMAL LOW (ref 150–400)
RBC: 3.25 MIL/uL — ABNORMAL LOW (ref 3.87–5.11)
RDW: 18.9 % — ABNORMAL HIGH (ref 11.5–15.5)
WBC: 7 10*3/uL (ref 4.0–10.5)
nRBC: 4.2 % — ABNORMAL HIGH (ref 0.0–0.2)

## 2020-04-29 LAB — BASIC METABOLIC PANEL
Anion gap: 9 (ref 5–15)
BUN: 13 mg/dL (ref 8–23)
CO2: 24 mmol/L (ref 22–32)
Calcium: 9.4 mg/dL (ref 8.9–10.3)
Chloride: 106 mmol/L (ref 98–111)
Creatinine, Ser: 0.68 mg/dL (ref 0.44–1.00)
GFR, Estimated: >60 mL/min (ref >60)
Glucose, Bld: 145 mg/dL — ABNORMAL HIGH (ref 70–99)
Potassium: 3.8 mmol/L (ref 3.5–5.1)
Sodium: 139 mmol/L (ref 135–145)

## 2020-04-29 LAB — RESP PANEL BY RT-PCR (FLU A&B, COVID) ARPGX2
Influenza A by PCR: NEGATIVE
Influenza B by PCR: NEGATIVE
SARS Coronavirus 2 by RT PCR: NEGATIVE

## 2020-04-29 LAB — PROTIME-INR
INR: 1.3 — ABNORMAL HIGH (ref 0.8–1.2)
Prothrombin Time: 15.4 seconds — ABNORMAL HIGH (ref 11.4–15.2)

## 2020-04-29 LAB — GLUCOSE, CAPILLARY: Glucose-Capillary: 153 mg/dL — ABNORMAL HIGH (ref 70–99)

## 2020-04-29 LAB — CBG MONITORING, ED: Glucose-Capillary: 131 mg/dL — ABNORMAL HIGH (ref 70–99)

## 2020-04-29 MED ORDER — PANTOPRAZOLE SODIUM 40 MG PO TBEC
40.0000 mg | DELAYED_RELEASE_TABLET | Freq: Every day | ORAL | Status: DC
Start: 2020-04-29 — End: 2020-05-12
  Administered 2020-04-29 – 2020-05-11 (×12): 40 mg via ORAL
  Filled 2020-04-29 (×13): qty 1

## 2020-04-29 MED ORDER — ENSURE MAX PROTEIN PO LIQD
11.0000 [oz_av] | Freq: Three times a day (TID) | ORAL | Status: DC
  Administered 2020-04-29 – 2020-05-04 (×5): 11 [oz_av] via ORAL
  Filled 2020-04-29 (×37): qty 330

## 2020-04-29 MED ORDER — MORPHINE SULFATE (PF) 4 MG/ML IV SOLN
6.0000 mg | Freq: Once | INTRAVENOUS | Status: AC
  Administered 2020-04-29: 6 mg via INTRAVENOUS
  Filled 2020-04-29: qty 2

## 2020-04-29 MED ORDER — METHOCARBAMOL 500 MG PO TABS
500.0000 mg | ORAL_TABLET | Freq: Four times a day (QID) | ORAL | Status: DC | PRN
  Filled 2020-04-29: qty 1

## 2020-04-29 MED ORDER — SALINE SPRAY 0.65 % NA SOLN
1.0000 | NASAL | Status: DC | PRN
  Filled 2020-04-29: qty 44

## 2020-04-29 MED ORDER — SODIUM CHLORIDE 0.9 % IV BOLUS
500.0000 mL | Freq: Once | INTRAVENOUS | Status: AC
  Administered 2020-04-29: 500 mL via INTRAVENOUS

## 2020-04-29 MED ORDER — PRAVASTATIN SODIUM 40 MG PO TABS
40.0000 mg | ORAL_TABLET | Freq: Every day | ORAL | Status: DC
  Administered 2020-04-29 – 2020-05-10 (×11): 40 mg via ORAL
  Filled 2020-04-29 (×11): qty 1

## 2020-04-29 MED ORDER — HYDROCODONE-ACETAMINOPHEN 5-325 MG PO TABS
1.0000 | ORAL_TABLET | Freq: Four times a day (QID) | ORAL | Status: DC | PRN
  Administered 2020-04-29 – 2020-05-01 (×6): 2 via ORAL
  Filled 2020-04-29 (×7): qty 2

## 2020-04-29 MED ORDER — DEXAMETHASONE SODIUM PHOSPHATE 10 MG/ML IJ SOLN
INTRAMUSCULAR | Status: AC
  Filled 2020-04-29: qty 1

## 2020-04-29 MED ORDER — METHOCARBAMOL 1000 MG/10ML IJ SOLN
500.0000 mg | Freq: Four times a day (QID) | INTRAVENOUS | Status: DC | PRN
  Filled 2020-04-29: qty 5

## 2020-04-29 MED ORDER — OXYBUTYNIN CHLORIDE 5 MG PO TABS
5.0000 mg | ORAL_TABLET | Freq: Three times a day (TID) | ORAL | Status: DC
  Administered 2020-04-29 – 2020-05-05 (×15): 5 mg via ORAL
  Filled 2020-04-29 (×18): qty 1

## 2020-04-29 MED ORDER — ONDANSETRON HCL 4 MG/2ML IJ SOLN
4.0000 mg | Freq: Four times a day (QID) | INTRAMUSCULAR | Status: DC | PRN
  Administered 2020-04-30: 4 mg via INTRAVENOUS
  Filled 2020-04-29: qty 2

## 2020-04-29 MED ORDER — ONDANSETRON HCL 4 MG/2ML IJ SOLN
INTRAMUSCULAR | Status: AC
  Administered 2020-04-29: 4 mg
  Filled 2020-04-29: qty 2

## 2020-04-29 MED ORDER — SENNOSIDES-DOCUSATE SODIUM 8.6-50 MG PO TABS
1.0000 | ORAL_TABLET | Freq: Every day | ORAL | Status: DC
  Administered 2020-05-01 – 2020-05-11 (×9): 1 via ORAL
  Filled 2020-04-29 (×10): qty 1

## 2020-04-29 MED ORDER — CHLORHEXIDINE GLUCONATE 4 % EX LIQD
60.0000 mL | Freq: Once | CUTANEOUS | Status: AC
  Administered 2020-04-30: 4 via TOPICAL
  Filled 2020-04-29: qty 15

## 2020-04-29 MED ORDER — PROPOFOL 10 MG/ML IV BOLUS
INTRAVENOUS | Status: AC
  Filled 2020-04-29: qty 20

## 2020-04-29 MED ORDER — HYDROMORPHONE HCL 1 MG/ML IJ SOLN
0.5000 mg | Freq: Once | INTRAMUSCULAR | Status: AC
  Administered 2020-04-29: 0.5 mg via INTRAVENOUS
  Filled 2020-04-29: qty 1

## 2020-04-29 MED ORDER — ONDANSETRON HCL 4 MG/2ML IJ SOLN
4.0000 mg | Freq: Once | INTRAMUSCULAR | Status: DC
Start: 2020-04-29 — End: 2020-04-29

## 2020-04-29 MED ORDER — CEFAZOLIN SODIUM-DEXTROSE 2-4 GM/100ML-% IV SOLN
2.0000 g | INTRAVENOUS | Status: AC
  Administered 2020-04-30: 2 g via INTRAVENOUS
  Filled 2020-04-29: qty 100

## 2020-04-29 MED ORDER — SODIUM CHLORIDE 0.9 % IV BOLUS
1000.0000 mL | Freq: Once | INTRAVENOUS | Status: AC
  Administered 2020-04-29: 1000 mL via INTRAVENOUS

## 2020-04-29 MED ORDER — SODIUM CHLORIDE 0.9% IV SOLUTION: Freq: Once | INTRAVENOUS | Status: AC

## 2020-04-29 MED ORDER — ONDANSETRON HCL 4 MG/2ML IJ SOLN
INTRAMUSCULAR | Status: AC
  Filled 2020-04-29: qty 2

## 2020-04-29 MED ORDER — ROCURONIUM BROMIDE 10 MG/ML (PF) SYRINGE
PREFILLED_SYRINGE | INTRAVENOUS | Status: AC
  Filled 2020-04-29: qty 10

## 2020-04-29 MED ORDER — FLUTICASONE PROPIONATE 50 MCG/ACT NA SUSP
2.0000 | Freq: Every day | NASAL | Status: DC | PRN
  Administered 2020-05-07 – 2020-05-08 (×2): 2 via NASAL
  Filled 2020-04-29 (×2): qty 16

## 2020-04-29 MED ORDER — PREGABALIN 100 MG PO CAPS
100.0000 mg | ORAL_CAPSULE | Freq: Every day | ORAL | Status: DC
Start: 2020-04-29 — End: 2020-05-04
  Administered 2020-04-29 – 2020-05-03 (×4): 100 mg via ORAL
  Filled 2020-04-29 (×4): qty 1

## 2020-04-29 MED ORDER — INSULIN ASPART 100 UNIT/ML ~~LOC~~ SOLN
0.0000 [IU] | Freq: Three times a day (TID) | SUBCUTANEOUS | Status: DC
  Administered 2020-04-30 – 2020-05-01 (×2): 1 [IU] via SUBCUTANEOUS
  Administered 2020-05-01 – 2020-05-04 (×7): 2 [IU] via SUBCUTANEOUS
  Administered 2020-05-04 – 2020-05-07 (×6): 1 [IU] via SUBCUTANEOUS
  Administered 2020-05-07 – 2020-05-08 (×2): 2 [IU] via SUBCUTANEOUS
  Administered 2020-05-09 (×2): 3 [IU] via SUBCUTANEOUS
  Administered 2020-05-10: 1 [IU] via SUBCUTANEOUS
  Administered 2020-05-10: 3 [IU] via SUBCUTANEOUS
  Administered 2020-05-10: 5 [IU] via SUBCUTANEOUS
  Administered 2020-05-11: 1 [IU] via SUBCUTANEOUS
  Administered 2020-05-11: 3 [IU] via SUBCUTANEOUS
  Administered 2020-05-11: 1 [IU] via SUBCUTANEOUS

## 2020-04-29 MED ORDER — MONTELUKAST SODIUM 10 MG PO TABS
10.0000 mg | ORAL_TABLET | Freq: Every day | ORAL | Status: DC
  Administered 2020-04-29 – 2020-05-10 (×11): 10 mg via ORAL
  Filled 2020-04-29 (×12): qty 1

## 2020-04-29 MED ORDER — ACETAMINOPHEN 500 MG PO TABS: 1000.0000 mg | ORAL_TABLET | Freq: Every day | ORAL | Status: DC

## 2020-04-29 MED ORDER — MORPHINE SULFATE (PF) 2 MG/ML IV SOLN
0.5000 mg | INTRAVENOUS | Status: DC | PRN
  Administered 2020-04-30: 0.5 mg via INTRAVENOUS
  Filled 2020-04-29: qty 1

## 2020-04-29 MED ORDER — FAMOTIDINE 20 MG PO TABS
40.0000 mg | ORAL_TABLET | Freq: Every day | ORAL | Status: DC
  Administered 2020-04-29 – 2020-05-11 (×12): 40 mg via ORAL
  Filled 2020-04-29 (×13): qty 2

## 2020-04-29 MED ORDER — LIDOCAINE 2% (20 MG/ML) 5 ML SYRINGE
INTRAMUSCULAR | Status: AC
  Filled 2020-04-29: qty 5

## 2020-04-29 MED ORDER — SIMETHICONE 80 MG PO CHEW
80.0000 mg | CHEWABLE_TABLET | Freq: Four times a day (QID) | ORAL | Status: DC | PRN
  Administered 2020-05-04 – 2020-05-09 (×2): 80 mg via ORAL
  Filled 2020-04-29 (×2): qty 1

## 2020-04-29 MED ORDER — OLOPATADINE HCL 0.1 % OP SOLN
1.0000 [drp] | Freq: Two times a day (BID) | OPHTHALMIC | Status: DC | PRN
  Filled 2020-04-29: qty 5

## 2020-04-29 NOTE — ED Triage Notes (Addendum)
Pt BIBA from work after patient tripping over a child's toy landing on her right hip. Pt states she heard a pop when she landed followed by severe pain. Right leg rotated and shortened. Denies LOC. Denies neck/back pain. Denies blood thinners. Pt did not hit head. PT O2 saturation 80% on RA. Pt states she does wear oxygen but only at night.

## 2020-04-29 NOTE — Progress Notes (Signed)
TR[facility] night shift progressive coverage note.  The staff reports that the patient has nausea and emesis.  She is currently getting hydrocodone/morphine for right femoral fracture secondary to a fall earlier today.  Ondansetron 4 mg IVP every 6 hours as needed ordered.  Tennis Must, MD

## 2020-04-29 NOTE — Consult Note (Signed)
Reason for Consult:Right hip fx Referring Physician: R Dykstra, MD Time called: 1221 Time at bedside: 1225   Kathy Irwin is an 70 y.o. female.  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 She had immediate hip pain and could not get up. She was brought to the ED where x-rays showed a hip fx and orthopedic surgery was consulted. She lives alone but has lots of family help available.  Past Medical History:  Diagnosis Date  . Acute respiratory failure with hypoxia (H[facility]) 12/2018  . Diabetes mellitus without complication ([facility])   . 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 Hypertension   . ILD (interstitial lung disease) ([facility]) 12/24/2018    Past Surgical History:  Procedure Laterality Date  . ABDOMINAL HYSTERECTOMY    . VIDEO BRONCHOSCOPY Bilateral 04/02/2019   Procedure: VIDEO BRONCHOSCOPY WITH FLUORO;  Surgeon: Mannam, Praveen, MD;  Location: [facility];  Service: Cardiopulmonary;  Laterality: Bilateral;    Family History  Problem Relation Age of Onset  . Diabetes Other   . Hypertension Other     Social History:  reports that she has never smoked. She has never used smokeless tobacco. She reports that she does not drink alcohol and does not use drugs.  Allergies:  Allergies  Allergen Reactions  . Other Shortness Of Breath and Other (See Comments)    Newspaper ink =  new chest pain, also  . Iodine Other (See Comments)    "Was a long time ago" (Reaction??)  . Merbromin Other (See Comments)    Mercurochrome- "Was a long time ago" (Reaction??)  . Tape     Medications: I have reviewed the patient's current medications.  No results found for this or any previous visit (from the past 48 hour(s)).  DG Chest 1 View  Result Date: 04/29/2020 CLINICAL DATA:  Pain following fall EXAM: CHEST  1 VIEW COMPARISON:  October 15, 2019 FINDINGS: Lungs are clear. Heart size and pulmonary vascularity are normal. No pneumothorax. No adenopathy.  No bone lesions. IMPRESSION: Lungs clear.  Cardiac silhouette normal. Electronically Signed   By: William  Woodruff III M.D.   On: 04/29/2020 12:03   DG Hip Unilat W or Wo Pelvis 2-3 Views Right  Result Date: 04/29/2020 CLINICAL DATA:  Pain following fall EXAM: DG HIP (WITH OR WITHOUT PELVIS) 2-3V RIGHT COMPARISON:  None. FINDINGS: Frontal pelvis as well as frontal and lateral right hip images were obtained. There is a comminuted intertrochanteric femur fracture on the right with impaction at the fracture site and varus angulation. There is avulsion of the greater trochanter on the right. No other fractures. No dislocation. There is no appreciable joint space narrowing. There is bony overgrowth along the superolateral aspect of each acetabulum. There is degenerative change in the lower lumbar spine. IMPRESSION: Comminuted intertrochanteric femur fracture on the right with impaction and varus angulation at the fracture site as well as avulsion of the greater trochanter. No other fracture. No dislocation. 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 Bony overgrowth along the superolateral aspect of each acetabulum, a finding potentially placing patient at increased risk for femoroacetabular impingement. Electronically Signed   By: William  Woodruff III M.D.   On: 04/29/2020 12:00    Review of Systems  HENT: Negative for ear discharge, ear pain, hearing loss and tinnitus.   Eyes: Negative for photophobia and pain.  Respiratory: Negative for cough and shortness of breath.   Cardiovascular: Negative for chest pain.    Gastrointestinal: Negative for abdominal pain, nausea and vomiting.  Genitourinary: Negative for dysuria, flank pain, frequency and urgency.  Musculoskeletal: Positive for arthralgias (Right hip). Negative for back pain, myalgias and neck pain.  Neurological: Negative for dizziness and headaches.  Hematological: Does not bruise/bleed easily.  Psychiatric/Behavioral: The patient is  not nervous/anxious.    Blood pressure 117/60, pulse 88, temperature 98 F (36.7 C), temperature source Oral, resp. rate (!) 30, SpO2 96 %. Physical Exam Constitutional:      General: She is not in acute distress.    Appearance: She is well-developed and well-nourished. She is not diaphoretic.  HENT:     Head: Normocephalic and atraumatic.  Eyes:     General: No scleral icterus.       Right eye: No discharge.        Left eye: No discharge.     Conjunctiva/sclera: Conjunctivae normal.  Cardiovascular:     Rate and Rhythm: Normal rate and regular rhythm.  Pulmonary:     Effort: Pulmonary effort is normal. No respiratory distress.  Musculoskeletal:     Cervical back: Normal range of motion.     Comments: LLE No traumatic wounds, ecchymosis, or rash  Mod TTP hip  No knee or ankle effusion  Knee stable to varus/ valgus and anterior/posterior stress  Sens DPN, SPN, TN intact  Motor EHL, ext, flex, evers 5/5  DP 1+, PT 0, No significant edema  Skin:    General: Skin is warm and dry.  Neurological:     Mental Status: She is alert.  Psychiatric:        Mood and Affect: Mood and affect normal.        Behavior: Behavior normal.     Assessment/Plan: Right hip fx -- Plan IMN tomorrow by Dr. Haddix. Please keep NPO after MN. Multiple medical problems including DM, HTN, and O2-dependent lung disease -- Medicine to admit and manage. Appreciate their help.    Coden Franchi J. Alyssa Rotondo, PA-C Orthopedic Surgery [phone] 04/29/2020, 12:38 PM  

## 2020-04-29 NOTE — H&P (View-Only) (Signed)
Reason for Consult:Right hip fx Referring Physician: Willette Cluster, MD Time called: 1221 Time at bedside: Kathy Irwin is an 70 y.o. female.  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 Kathy Irwin had immediate hip pain and could not get up. Kathy Irwin was brought to the ED where x-rays showed a hip fx and orthopedic surgery was consulted. Kathy Irwin lives alone but has lots of family help available.  Past Medical History:  Diagnosis Date  . Acute respiratory failure with hypoxia ([place]) 12/2018  . Diabetes mellitus without complication ([place])   . Hypersensitivity pneumonitis ([place]) 12/19/2017  . Hypertension   . ILD (interstitial lung disease) ([place]) 12/24/2018    Past Surgical History:  Procedure Laterality Date  . ABDOMINAL HYSTERECTOMY    . VIDEO BRONCHOSCOPY Bilateral 04/02/2019   Procedure: VIDEO BRONCHOSCOPY WITH FLUORO;  Surgeon: Marshell Garfinkel, MD;  Location: [facility];  Service: Cardiopulmonary;  Laterality: Bilateral;    Family History  Problem Relation Age of Onset  . Diabetes Other   . Hypertension Other     Social History:  reports that Kathy Irwin has never smoked. Kathy Irwin has never used smokeless tobacco. Kathy Irwin reports that Kathy Irwin does not drink alcohol and does not use drugs.  Allergies:  Allergies  Allergen Reactions  . Other Shortness Of Breath and Other (See Comments)    Newspaper ink =  new chest pain, also  . Iodine Other (See Comments)    "Was a long time ago" (Reaction??)  . Merbromin Other (See Comments)    Mercurochrome- "Was a long time ago" (Reaction??)  . Tape     Medications: I have reviewed the patient's current medications.  No results found for this or any previous visit (from the past 48 hour(s)).  DG Chest 1 View  Result Date: 04/29/2020 CLINICAL DATA:  Pain following fall EXAM: CHEST  1 VIEW COMPARISON:  October 15, 2019 FINDINGS: Lungs are clear. Heart size and pulmonary vascularity are normal. No pneumothorax. No adenopathy.  No bone lesions. IMPRESSION: Lungs clear.  Cardiac silhouette normal. Electronically Signed   By: Lowella Grip III M.D.   On: 04/29/2020 12:03   DG Hip Unilat W or Wo Pelvis 2-3 Views Right  Result Date: 04/29/2020 CLINICAL DATA:  Pain following fall EXAM: DG HIP (WITH OR WITHOUT PELVIS) 2-3V RIGHT COMPARISON:  None. FINDINGS: Frontal pelvis as well as frontal and lateral right hip images were obtained. There is a comminuted intertrochanteric femur fracture on the right with impaction at the fracture site and varus angulation. There is avulsion of the greater trochanter on the right. No other fractures. No dislocation. There is no appreciable joint space narrowing. There is bony overgrowth along the superolateral aspect of each acetabulum. There is degenerative change in the lower lumbar spine. IMPRESSION: Comminuted intertrochanteric femur fracture on the right with impaction and varus angulation at the fracture site as well as avulsion of the greater trochanter. No other fracture. No dislocation. 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 Bony overgrowth along the superolateral aspect of each acetabulum, a finding potentially placing patient at increased risk for femoroacetabular impingement. Electronically Signed   By: Lowella Grip III M.D.   On: 04/29/2020 12:00    Review of Systems  HENT: Negative for ear discharge, ear pain, hearing loss and tinnitus.   Eyes: Negative for photophobia and pain.  Respiratory: Negative for cough and shortness of breath.   Cardiovascular: Negative for chest pain.  Gastrointestinal: Negative for abdominal pain, nausea and vomiting.  Genitourinary: Negative for dysuria, flank pain, frequency and urgency.  Musculoskeletal: Positive for arthralgias (Right hip). Negative for back pain, myalgias and neck pain.  Neurological: Negative for dizziness and headaches.  Hematological: Does not bruise/bleed easily.  Psychiatric/Behavioral: The patient is  not nervous/anxious.    Blood pressure 117/60, pulse 88, temperature 98 F (36.7 C), temperature source Oral, resp. rate (!) 30, SpO2 96 %. Physical Exam Constitutional:      General: Kathy Irwin is not in acute distress.    Appearance: Kathy Irwin is well-developed and well-nourished. Kathy Irwin is not diaphoretic.  HENT:     Head: Normocephalic and atraumatic.  Eyes:     General: No scleral icterus.       Right eye: No discharge.        Left eye: No discharge.     Conjunctiva/sclera: Conjunctivae normal.  Cardiovascular:     Rate and Rhythm: Normal rate and regular rhythm.  Pulmonary:     Effort: Pulmonary effort is normal. No respiratory distress.  Musculoskeletal:     Cervical back: Normal range of motion.     Comments: LLE No traumatic wounds, ecchymosis, or rash  Mod TTP hip  No knee or ankle effusion  Knee stable to varus/ valgus and anterior/posterior stress  Sens DPN, SPN, TN intact  Motor EHL, ext, flex, evers 5/5  DP 1+, PT 0, No significant edema  Skin:    General: Skin is warm and dry.  Neurological:     Mental Status: Kathy Irwin is alert.  Psychiatric:        Mood and Affect: Mood and affect normal.        Behavior: Behavior normal.     Assessment/Plan: Right hip fx -- Plan IMN tomorrow by Dr. Doreatha Martin. Please keep NPO after MN. Multiple medical problems including DM, HTN, and O2-dependent lung disease -- Medicine to admit and manage. Appreciate their help.    Lisette Abu, PA-C Orthopedic Surgery [phone] 04/29/2020, 12:38 PM

## 2020-04-29 NOTE — ED Notes (Signed)
Dinner Tray Ordered @ 1733. 

## 2020-04-29 NOTE — ED Notes (Signed)
MD aware of BP

## 2020-04-29 NOTE — ED Notes (Signed)
Attempted to call report. RN unable to take report.

## 2020-04-29 NOTE — ED Notes (Signed)
MD at bedside

## 2020-04-29 NOTE — H&P (Signed)
History and Physical    Kathy Irwin VEH:[id number] DOB: 13-Feb-1951 DOA: 04/29/2020  Referring MD/NP/PA: Domenic Moras, PA-C PCP: Kerin Perna, NP  Consultants: Sullivan Lone, MD Orthopedics Patient coming from: Daycare via EMS  Chief Complaint: Fall  I have personally briefly reviewed patient's old medical records in [place]   HPI: Kathy Irwin is a 70 y.o. female with medical history significant of HTN, interstitial lung disease, chronic respiratory failure with hypoxia on 2 L at night, DM type II, ITP,  CMML-1, and obesity presents after having a fall with right hip pain.  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  She landed on her right hip and reported severe pain thereafter.  Due to the pain she was unable to stand up or bear weight on the leg, and any kind of movement of that leg worsening pain.  At baseline patient currently feels short of breath is unchanged and she has been in her normal state of health otherwise prior to falling this morning.  She had not taken any of her home blood pressure medications.  ED Course: Admission as a emergency department patient was seen to be afebrile with respirations 30-34, and all other vital signs maintain. Labs significant for hemoglobin 10.5, platelets 30, and INR 1.3. X-rays revealed a communicated fracture of the right hip. Orthopedics was formally consulted with plans to take for surgical correction tomorrow. [facility] called to admit.  Review of Systems  Constitutional: Negative for fever and malaise/fatigue.  HENT: Negative for congestion and nosebleeds.   Eyes: Negative for double vision and pain.  Respiratory: Positive for shortness of breath (Chronic).   Cardiovascular: Negative for chest pain and leg swelling.  Gastrointestinal: Negative for abdominal pain, nausea and vomiting.  Genitourinary: Negative for dysuria and  hematuria.  Musculoskeletal: Positive for falls, joint pain and myalgias.  Neurological: Negative for focal weakness and loss of consciousness.  Psychiatric/Behavioral: Negative for memory loss and substance abuse.    Past Medical History:  Diagnosis Date  . Acute respiratory failure with hypoxia ([place]) 12/2018  . Diabetes mellitus without complication ([place])   . Hypersensitivity pneumonitis ([place]) 12/19/2017  . Hypertension   . ILD (interstitial lung disease) ([place]) 12/24/2018    Past Surgical History:  Procedure Laterality Date  . ABDOMINAL HYSTERECTOMY    . VIDEO BRONCHOSCOPY Bilateral 04/02/2019   Procedure: VIDEO BRONCHOSCOPY WITH FLUORO;  Surgeon: Marshell Garfinkel, MD;  Location: [facility];  Service: Cardiopulmonary;  Laterality: Bilateral;     reports that she has never smoked. She has never used smokeless tobacco. She reports that she does not drink alcohol and does not use drugs.  Allergies  Allergen Reactions  . Other Shortness Of Breath and Other (See Comments)    Newspaper ink =  new chest pain, also  . Iodine Other (See Comments)    "Was a long time ago" (Reaction??)  . Merbromin Other (See Comments)    Mercurochrome- "Was a long time ago" (Reaction??)  . Tape     Family History  Problem Relation Age of Onset  . Diabetes Other   . Hypertension Other     Prior to Admission medications   Medication Sig Start Date End Date Taking? Authorizing Provider  acetaminophen (TYLENOL) 500 MG tablet Take 1,000 mg by mouth 2 (two) times daily as needed (for pain).    [provider]  albuterol (VENTOLIN  HFA) 108 (90 Base) MCG/ACT inhaler Inhale 1-2 puffs into the lungs every 6 (six) hours as needed for wheezing or shortness of breath. 02/11/20   Kerin Perna, NP  blood glucose meter kit and supplies KIT Dispense based on patient and insurance preference. Use up to four times daily as directed. (FOR ICD-9 250.00, 250.01). For QAC - HS accuchecks. 11/22/18   Thurnell Lose, MD  blood glucose meter kit and supplies Dispense based on patient and insurance preference. Use up to four times daily as directed. (FOR ICD-10 E10.9, E11.9). 12/30/19   Kerin Perna, NP  cetirizine (ZYRTEC ALLERGY) 10 MG tablet Take 1 tablet (10 mg total) by mouth daily for 7 days. 03/17/20 03/24/20  Lannie Fields, PA-C  [employer] D3 125 MCG (5000 UT) capsule Take 5,000 Units by mouth daily. 07/16/19   [provider]  Ensure Max Protein (ENSURE MAX PROTEIN) LIQD Take 330 mLs (11 oz total) by mouth 3 (three) times daily. 12/01/19   Pokhrel, Corrie Mckusick, MD  famotidine (PEPCID) 40 MG tablet Take 1 tablet (40 mg total) by mouth daily. 12/02/19   Pokhrel, Corrie Mckusick, MD  fluticasone (FLONASE) 50 MCG/ACT nasal spray Place 2 sprays into both nostrils daily as needed for allergies or rhinitis. 02/05/20   Kerin Perna, NP  furosemide (LASIX) 20 MG tablet Take 1 pill in the morning 02/05/20   Kerin Perna, NP  glucose blood (FREESTYLE LITE) test strip For glucose testing every before meals at bedtime. Diagnosis E 11.65  Can substitute to any accepted brand 11/22/18   Thurnell Lose, MD  linaclotide [facility]) 145 MCG CAPS capsule Take 1 capsule (145 mcg total) by mouth daily before breakfast. 12/02/19   Pokhrel, Corrie Mckusick, MD  magnesium gluconate (MAGONATE) 30 MG tablet Take 1 tablet (30 mg total) by mouth 2 (two) times daily. 01/01/20   Kerin Perna, NP  metFORMIN (GLUCOPHAGE) 1000 MG tablet Take 1 tablet (1,000 mg total) by mouth 2 (two) times daily with a meal. 12/30/19   Kerin Perna, NP  methocarbamol (ROBAXIN) 500 MG tablet Take 1 tablet (500 mg total) by mouth every 8 (eight) hours as needed for muscle spasms. 02/05/20   Kerin Perna, NP  metoprolol succinate (TOPROL-XL) 25 MG 24 hr tablet Take 1 tablet (25 mg total) by mouth daily. 02/05/20   Kerin Perna, NP  metoprolol succinate (TOPROL-XL) 50 MG 24 hr tablet TAKE 1 TABLET BY MOUTH EVERY DAY IN THE  MORNING 04/07/20   Kerin Perna, NP  Multiple Vitamin (MULTIVITAMIN WITH MINERALS) TABS tablet Take 1 tablet by mouth daily. 12/02/19   Pokhrel, Corrie Mckusick, MD  olopatadine (PATANOL) 0.1 % ophthalmic solution Place 1 drop into both eyes 2 (two) times daily. Patient taking differently: Place 1 drop into both eyes 2 (two) times daily as needed for allergies. 07/24/19   Wieters, Hallie C, PA-C  ondansetron (ZOFRAN) 4 MG tablet Take 1 tablet (4 mg total) by mouth every 8 (eight) hours as needed for nausea or vomiting. 08/15/19   Rancour, Annie Main, MD  oxybutynin (DITROPAN) 5 MG tablet Take 1 tablet (5 mg total) by mouth 3 (three) times daily. 04/26/20   Kerin Perna, NP  pantoprazole (PROTONIX) 40 MG tablet Take 1 tablet (40 mg total) by mouth daily. 12/01/19   Pokhrel, Corrie Mckusick, MD  polyethylene glycol (MIRALAX / GLYCOLAX) 17 g packet Take 17 g by mouth daily as needed for mild constipation or moderate constipation. 12/01/19   Pokhrel, [place],  MD  pravastatin (PRAVACHOL) 40 MG tablet Take 1 tablet (40 mg total) by mouth at bedtime. 12/30/19   Kerin Perna, NP  pregabalin (LYRICA) 100 MG capsule Take 1 capsule (100 mg total) by mouth at bedtime. 02/05/20   Kerin Perna, NP  senna-docusate (SENOKOT-S) 8.6-50 MG tablet Take 1 tablet by mouth 2 (two) times daily. 11/24/19   Domenic Polite, MD  sertraline (ZOLOFT) 100 MG tablet Take 1 pill at bedtime 12/30/19   Kerin Perna, NP  simethicone (MYLICON) 80 MG chewable tablet Chew 1 tablet (80 mg total) by mouth 4 (four) times daily as needed for flatulence. 12/01/19   Pokhrel, Corrie Mckusick, MD  sodium chloride (OCEAN) 0.65 % SOLN nasal spray Place 1 spray into both nostrils as needed for congestion. 08/12/19   Dessa Phi, DO  tamsulosin (FLOMAX) 0.4 MG CAPS capsule Take 1 capsule (0.4 mg total) by mouth daily. 03/22/20   Kerin Perna, NP    Physical Exam:  Constitutional: Older female who appears to be in some distress Vitals:    04/29/20 1105 04/29/20 1242  BP: 117/60 118/65  Pulse: 88 93  Resp: (!) 30 (!) 34  Temp: 98 F (36.7 C) 98.3 F (36.8 C)  TempSrc: Oral Oral  SpO2: 96% 100%   Eyes: PERRL, lids and conjunctivae normal ENMT: Mucous membranes are moist. Posterior pharynx clear of any exudate or lesions.  Neck: normal, supple, no masses, no thyromegaly Respiratory: Tachypneic with decreased aeration.  Able to speak in full sentences Cardiovascular: Regular rate and rhythm, no murmurs / rubs / gallops. No extremity edema. 2+ pedal pulses. No carotid bruits.  Abdomen: no tenderness, no masses palpated. No hepatosplenomegaly. Bowel sounds positive.  Musculoskeletal: no clubbing / cyanosis.  Right hip is externally rotated and mildly shortened. Skin: no rashes, lesions, ulcers. No induration Neurologic: CN 2-12 grossly intact. Sensation intact, DTR normal. Strength 5/5 in all 4.  Psychiatric: Normal judgment and insight. Alert and oriented x 3. Normal mood.     Labs on Admission: I have personally reviewed following labs and imaging studies  CBC: Recent Labs  Lab 04/29/20 1205  WBC 7.0  NEUTROABS 0.0*  HGB 10.5*  HCT 32.7*  MCV 100.6*  PLT 30*   Basic Metabolic Panel: Recent Labs  Lab 04/29/20 1205  NA 139  K 3.8  CL 106  CO2 24  GLUCOSE 145*  BUN 13  CREATININE 0.68  CALCIUM 9.4   GFR: CrCl cannot be calculated (Unknown ideal weight.). Liver Function Tests: No results for input(s): AST, ALT, ALKPHOS, BILITOT, PROT, ALBUMIN in the last 168 hours. No results for input(s): LIPASE, AMYLASE in the last 168 hours. No results for input(s): AMMONIA in the last 168 hours. Coagulation Profile: Recent Labs  Lab 04/29/20 1205  INR 1.3*   Cardiac Enzymes: No results for input(s): CKTOTAL, CKMB, CKMBINDEX, TROPONINI in the last 168 hours. BNP (last 3 results) No results for input(s): PROBNP in the last 8760 hours. HbA1C: No results for input(s): HGBA1C in the last 72 hours. CBG: No  results for input(s): GLUCAP in the last 168 hours. Lipid Profile: No results for input(s): CHOL, HDL, LDLCALC, TRIG, CHOLHDL, LDLDIRECT in the last 72 hours. Thyroid Function Tests: No results for input(s): TSH, T4TOTAL, FREET4, T3FREE, THYROIDAB in the last 72 hours. Anemia Panel: No results for input(s): VITAMINB12, FOLATE, FERRITIN, TIBC, IRON, RETICCTPCT in the last 72 hours. Urine analysis:    Component Value Date/Time   COLORURINE YELLOW 11/09/2019 1236   APPEARANCEUR  CLOUDY (A) 11/09/2019 1236   LABSPEC 1.021 11/09/2019 1236   PHURINE 7.0 11/09/2019 1236   GLUCOSEU NEGATIVE 11/09/2019 1236   HGBUR NEGATIVE 11/09/2019 1236   [place] 11/09/2019 1236   [place] 11/09/2019 1236   PROTEINUR NEGATIVE 11/09/2019 1236   UROBILINOGEN 1.0 08/14/2019 1824   NITRITE NEGATIVE 11/09/2019 1236   LEUKOCYTESUR NEGATIVE 11/09/2019 1236   Sepsis Labs: No results found for this or any previous visit (from the past 240 hour(s)).   Radiological Exams on Admission: DG Chest 1 View  Result Date: 04/29/2020 CLINICAL DATA:  Pain following fall EXAM: CHEST  1 VIEW COMPARISON:  October 15, 2019 FINDINGS: Lungs are clear. Heart size and pulmonary vascularity are normal. No pneumothorax. No adenopathy. No bone lesions. IMPRESSION: Lungs clear.  Cardiac silhouette normal. Electronically Signed   By: Lowella Grip III M.D.   On: 04/29/2020 12:03   DG Hip Unilat W or Wo Pelvis 2-3 Views Right  Result Date: 04/29/2020 CLINICAL DATA:  Pain following fall EXAM: DG HIP (WITH OR WITHOUT PELVIS) 2-3V RIGHT COMPARISON:  None. FINDINGS: Frontal pelvis as well as frontal and lateral right hip images were obtained. There is a comminuted intertrochanteric femur fracture on the right with impaction at the fracture site and varus angulation. There is avulsion of the greater trochanter on the right. No other fractures. No dislocation. There is no appreciable joint space narrowing. There is bony  overgrowth along the superolateral aspect of each acetabulum. There is degenerative change in the lower lumbar spine. IMPRESSION: Comminuted intertrochanteric femur fracture on the right with impaction and varus angulation at the fracture site as well as avulsion of the greater trochanter. No other fracture. No dislocation. 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 Bony overgrowth along the superolateral aspect of each acetabulum, a finding potentially placing patient at increased risk for femoroacetabular impingement. Electronically Signed   By: Lowella Grip III M.D.   On: 04/29/2020 12:00    EKG: Independently reviewed.  Sinus rhythm 88 bpm  Assessment/Plan Communicated intertrochanteric right femur fracture secondary to fall: Acute.  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  Found to have a communicated intertrochanteric femur fracture on the right.  Orthopedics was consulted and plan on taking patient to surgery -Admit to medical telemetry bed -Hip Fracture order set utilized -Hydrocodone/morphine IV as needed for moderate-severe pain respectively -Appreciate orthopedics consultative services we will follow for further recommendations  Chronic ITP: Platelet count on admission 30,000.  Patient previously responsive to steroids in the past.  Discussed with Dr.Kale who recommended getting platelet count up to at least 50,000. Patient was typed and screened while in the emergency department -Transfuse 1 unit of platelets -Continue to monitor transfuse as needed to goal of at least 50,000 platelets prior to surgery -Appreciate oncology consultative services   Transient hypotension:Acute. On admission patient blood pressures noted to drop as low as 78/41, but improved with IV bolus of 1.5 L normal saline IV fluids.  Patient reports that she had not taken her home medications of Prozac 20 mg daily or metoprolol 75 mg daily. -Hold home  blood pressure medications  -Give additional IV fluids as needed  Chronic respiratory failure/interstitial lung disease: Patient reports that she chronically has shortness of breath is unchanged from previous.  Only utilizing oxygen at night -Continue oxygen as needed  Macrocytic anemia: Hemoglobin 10.5 on admission which appears near patient's baseline of 10-11 g/dL.  -Continue to monitor  CMML-1: Patient not on treatment at this time.  Followed in the outpatient setting by Dr. Irene Limbo. -Continue outpatient follow-up  Diabetes mellitus type 2, controlled: Last hemoglobin A1c noted to be 5.6 on 02/05/2020.  Home medications include metformin 1000 mg daily and Amaryl 4 mg daily. -Hypoglycemic protocols -Check hemoglobin A1c in a.m. -Hold home oral medications -CBGs before every meal with sensitive SSI  Chronic diastolic CHF: Last EF was noted to be greater than 70-75% in 07/2019.  Patient appears to be euvolemic at this time. -Daily weights -Strict intake and output  GERD -Continue home PPI   DVT prophylaxis: SCDs Code Status: Full Family Communication: Daughter updated at bedside Disposition Plan: Likely to need rehab Consults called: Orthopedic, oncology/hematology Admission status: Inpatient status due to right hip fracture and likely need of rehab  Norval Morton MD [facility]   If 7PM-7AM, please contact night-coverage   04/29/2020, 1:46 PM

## 2020-04-29 NOTE — ED Provider Notes (Signed)
[facility] EMERGENCY DEPARTMENT Provider Note   CSN: [id number] Arrival date & time: 04/29/20  1057     History Chief Complaint  Patient presents with  . Hip Pain  . [place] is a 70 y.o. female.  The history is provided by the patient and medical records. No language interpreter was used.  Hip Pain  Fall     70 year old female significant history of hypertension, diabetes, CHF, obesity, CML brought here via EMS from work for injury of her right hip.  Patient works at a daycare.  This morning while at work, she was walking when one of the toddler crossed her path and she was trying to avoid stepping on the child but since that stepping on a toy on the ground causing her to fall.  She landed on her right hip follows with a pop and severe pain about the hip.  She was unable to ambulate afterward or stand up.  Pain is rated as 7 out of 10, persistent, worsening with movement.  When EMS arrived, they noted that patient's right leg was shortened and externally rotated patient was then brought here for further care.  Patient has not received any treatment.  She denies any numbness.  She denies any precipitating symptoms prior to the fall denies any back pain.  No knee or ankle pain  Past Medical History:  Diagnosis Date  . Acute respiratory failure with hypoxia ([place]) 12/2018  . Diabetes mellitus without complication ([place])   . 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 Hypertension   . ILD (interstitial lung disease) ([place]) 12/24/2018    Patient Active Problem List   Diagnosis Date Noted  . Cellulitis of right lower leg 11/09/2019  . Sepsis ([place]) 11/09/2019  . Chronic diastolic CHF (congestive heart failure) ([facility]) 08/02/2019  . Chronic respiratory failure with hypoxia ([place]) 08/02/2019  . Acute on chronic respiratory failure with hypoxemia ([place]) 08/02/2019  . Atherosclerosis of aorta ([place]) 01/23/2019  . Interstitial lung disease ([place]) 12/24/2018  .  Obesity, Class III, BMI 40-49.9 (morbid obesity) ([place]) 12/24/2018  . Pneumonia due to COVID-19 virus 11/15/2018  . Acute respiratory failure ([place]) 12/19/2017  . 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 Thrombocytopenia ([place]) 12/19/2017  . Insulin-requiring or dependent type II diabetes mellitus ([place]) 12/17/2017  . HTN (hypertension) 12/17/2017  . Essential hypertension 11/05/2017  . 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 ABDOMINAL HYSTERECTOMY    . VIDEO BRONCHOSCOPY Bilateral 04/02/2019   Procedure: VIDEO BRONCHOSCOPY WITH FLUORO;  Surgeon: Marshell Garfinkel, MD;  Location: [facility];  Service: Cardiopulmonary;  Laterality: Bilateral;     OB History   No obstetric history on file.     Family History  Problem Relation Age of Onset  . Diabetes Other   . Hypertension Other     Social History   Tobacco Use  . Smoking status: Never Smoker  . Smokeless tobacco: Never Used  Vaping Use  . Vaping Use: Never used  Substance Use Topics  . Alcohol use: Never  . Drug use: Never    Home Medications Prior to Admission medications   Medication Sig Start Date End Date Taking? Authorizing Provider  acetaminophen (TYLENOL) 500 MG tablet Take 1,000 mg by mouth 2 (two) times daily as needed (for pain).    [provider]  albuterol (VENTOLIN HFA) 108 (90 Base) MCG/ACT inhaler Inhale 1-2 puffs into the lungs every 6 (six) hours  as needed for wheezing or shortness of breath. 02/11/20   Kerin Perna, NP  blood glucose meter kit and supplies KIT Dispense based on patient and insurance preference. Use up to four times daily as directed. (FOR ICD-9 250.00, 250.01). For QAC - HS accuchecks. 11/22/18   Thurnell Lose, MD  blood glucose meter kit and supplies Dispense based on patient and insurance preference. Use up to four times daily as directed. (FOR ICD-10 E10.9, E11.9). 12/30/19   Kerin Perna, NP   cetirizine (ZYRTEC ALLERGY) 10 MG tablet Take 1 tablet (10 mg total) by mouth daily for 7 days. 03/17/20 03/24/20  Lannie Fields, PA-C  [employer] D3 125 MCG (5000 UT) capsule Take 5,000 Units by mouth daily. 07/16/19   [provider]  Ensure Max Protein (ENSURE MAX PROTEIN) LIQD Take 330 mLs (11 oz total) by mouth 3 (three) times daily. 12/01/19   Pokhrel, Corrie Mckusick, MD  famotidine (PEPCID) 40 MG tablet Take 1 tablet (40 mg total) by mouth daily. 12/02/19   Pokhrel, Corrie Mckusick, MD  fluticasone (FLONASE) 50 MCG/ACT nasal spray Place 2 sprays into both nostrils daily as needed for allergies or rhinitis. 02/05/20   Kerin Perna, NP  furosemide (LASIX) 20 MG tablet Take 1 pill in the morning 02/05/20   Kerin Perna, NP  glucose blood (FREESTYLE LITE) test strip For glucose testing every before meals at bedtime. Diagnosis E 11.65  Can substitute to any accepted brand 11/22/18   Thurnell Lose, MD  linaclotide [facility]) 145 MCG CAPS capsule Take 1 capsule (145 mcg total) by mouth daily before breakfast. 12/02/19   Pokhrel, Corrie Mckusick, MD  magnesium gluconate (MAGONATE) 30 MG tablet Take 1 tablet (30 mg total) by mouth 2 (two) times daily. 01/01/20   Kerin Perna, NP  metFORMIN (GLUCOPHAGE) 1000 MG tablet Take 1 tablet (1,000 mg total) by mouth 2 (two) times daily with a meal. 12/30/19   Kerin Perna, NP  methocarbamol (ROBAXIN) 500 MG tablet Take 1 tablet (500 mg total) by mouth every 8 (eight) hours as needed for muscle spasms. 02/05/20   Kerin Perna, NP  metoprolol succinate (TOPROL-XL) 25 MG 24 hr tablet Take 1 tablet (25 mg total) by mouth daily. 02/05/20   Kerin Perna, NP  metoprolol succinate (TOPROL-XL) 50 MG 24 hr tablet TAKE 1 TABLET BY MOUTH EVERY DAY IN THE MORNING 04/07/20   Kerin Perna, NP  Multiple Vitamin (MULTIVITAMIN WITH MINERALS) TABS tablet Take 1 tablet by mouth daily. 12/02/19   Pokhrel, Corrie Mckusick, MD  olopatadine (PATANOL) 0.1 % ophthalmic  solution Place 1 drop into both eyes 2 (two) times daily. Patient taking differently: Place 1 drop into both eyes 2 (two) times daily as needed for allergies. 07/24/19   Wieters, Hallie C, PA-C  ondansetron (ZOFRAN) 4 MG tablet Take 1 tablet (4 mg total) by mouth every 8 (eight) hours as needed for nausea or vomiting. 08/15/19   Rancour, Annie Main, MD  oxybutynin (DITROPAN) 5 MG tablet Take 1 tablet (5 mg total) by mouth 3 (three) times daily. 04/26/20   Kerin Perna, NP  pantoprazole (PROTONIX) 40 MG tablet Take 1 tablet (40 mg total) by mouth daily. 12/01/19   Pokhrel, Corrie Mckusick, MD  polyethylene glycol (MIRALAX / GLYCOLAX) 17 g packet Take 17 g by mouth daily as needed for mild constipation or moderate constipation. 12/01/19   Pokhrel, Corrie Mckusick, MD  pravastatin (PRAVACHOL) 40 MG tablet Take 1 tablet (40 mg total) by mouth at  bedtime. 12/30/19   Kerin Perna, NP  pregabalin (LYRICA) 100 MG capsule Take 1 capsule (100 mg total) by mouth at bedtime. 02/05/20   Kerin Perna, NP  senna-docusate (SENOKOT-S) 8.6-50 MG tablet Take 1 tablet by mouth 2 (two) times daily. 11/24/19   Domenic Polite, MD  sertraline (ZOLOFT) 100 MG tablet Take 1 pill at bedtime 12/30/19   Kerin Perna, NP  simethicone (MYLICON) 80 MG chewable tablet Chew 1 tablet (80 mg total) by mouth 4 (four) times daily as needed for flatulence. 12/01/19   Pokhrel, Corrie Mckusick, MD  sodium chloride (OCEAN) 0.65 % SOLN nasal spray Place 1 spray into both nostrils as needed for congestion. 08/12/19   Dessa Phi, DO  tamsulosin (FLOMAX) 0.4 MG CAPS capsule Take 1 capsule (0.4 mg total) by mouth daily. 03/22/20   Kerin Perna, NP    Allergies    Other, Iodine, Merbromin, and Tape  Review of Systems   Review of Systems  Physical Exam Updated Vital Signs BP 118/65 (BP Location: Right Arm)   Pulse 93   Temp 98.3 F (36.8 C) (Oral)   Resp (!) 34   SpO2 100%   Physical Exam Vitals and nursing note reviewed.   Constitutional:      General: She is not in acute distress.    Appearance: She is well-developed and well-nourished. She is obese.  HENT:     Head: Atraumatic.  Eyes:     Conjunctiva/sclera: Conjunctivae normal.  Cardiovascular:     Rate and Rhythm: Normal rate and regular rhythm.     Pulses: Normal pulses.     Heart sounds: Normal heart sounds.  Pulmonary:     Effort: Pulmonary effort is normal.     Breath sounds: Normal breath sounds.  Abdominal:     Palpations: Abdomen is soft.     Tenderness: There is no abdominal tenderness.  Musculoskeletal:        General: Tenderness (Right hip: Tenderness to palpation of hip and inguinal region with decreased hip range of motion secondary to pain.  Right leg is shortened and externally rotated.  No ecchymosis or laceration noted.  Sensation is intact distal with intact distal pedal pul) present.     Cervical back: Neck supple.  Skin:    Findings: No rash.  Neurological:     Mental Status: She is alert.  Psychiatric:        Mood and Affect: Mood and affect normal.     ED Results / Procedures / Treatments   Labs (all labs ordered are listed, but only abnormal results are displayed) Labs Reviewed  BASIC METABOLIC PANEL - Abnormal; Notable for the following components:      Result Value   Glucose, Bld 145 (*)    All other components within normal limits  CBC WITH DIFFERENTIAL/PLATELET - Abnormal; Notable for the following components:   RBC 3.25 (*)    Hemoglobin 10.5 (*)    HCT 32.7 (*)    MCV 100.6 (*)    RDW 18.9 (*)    Platelets 30 (*)    nRBC 4.2 (*)    Neutro Abs 0.0 (*)    Lymphs Abs 0.0 (*)    Monocytes Absolute 0.0 (*)    All other components within normal limits  PROTIME-INR - Abnormal; Notable for the following components:   Prothrombin Time 15.4 (*)    INR 1.3 (*)    All other components within normal limits  RESP PANEL BY RT-PCR (FLU A&B,  COVID) [id number]  PATHOLOGIST SMEAR REVIEW  TYPE AND SCREEN    EKG None    Date: 04/29/2020  Rate: 88  Rhythm: normal sinus rhythm  QRS Axis: normal  Intervals: normal  ST/T Wave abnormalities: normal  Conduction Disutrbances: none  Narrative Interpretation:   Old EKG Reviewed: No significant changes noted EKG reviewed and interpreted by me.     Radiology DG Chest 1 View  Result Date: 04/29/2020 CLINICAL DATA:  Pain following fall EXAM: CHEST  1 VIEW COMPARISON:  October 15, 2019 FINDINGS: Lungs are clear. Heart size and pulmonary vascularity are normal. No pneumothorax. No adenopathy. No bone lesions. IMPRESSION: Lungs clear.  Cardiac silhouette normal. Electronically Signed   By: Lowella Grip III M.D.   On: 04/29/2020 12:03   DG Hip Unilat W or Wo Pelvis 2-3 Views Right  Result Date: 04/29/2020 CLINICAL DATA:  Pain following fall EXAM: DG HIP (WITH OR WITHOUT PELVIS) 2-3V RIGHT COMPARISON:  None. FINDINGS: Frontal pelvis as well as frontal and lateral right hip images were obtained. There is a comminuted intertrochanteric femur fracture on the right with impaction at the fracture site and varus angulation. There is avulsion of the greater trochanter on the right. No other fractures. No dislocation. There is no appreciable joint space narrowing. There is bony overgrowth along the superolateral aspect of each acetabulum. There is degenerative change in the lower lumbar spine. IMPRESSION: Comminuted intertrochanteric femur fracture on the right with impaction and varus angulation at the fracture site as well as avulsion of the greater trochanter. No other fracture. No dislocation. 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 Bony overgrowth along the superolateral aspect of each acetabulum, a finding potentially placing patient at increased risk for femoroacetabular impingement. Electronically Signed   By: Lowella Grip III M.D.   On: 04/29/2020 12:00    Procedures Procedures (including critical care time)  Medications Ordered in ED Medications   HYDROmorphone (DILAUDID) injection 0.5 mg (has no administration in time range)  morphine 4 MG/ML injection 6 mg (6 mg Intravenous Given 04/29/20 1219)  ondansetron (ZOFRAN) 4 MG/2ML injection (4 mg  Given 04/29/20 1218)    ED Course  I have reviewed the triage vital signs and the nursing notes.  Pertinent labs & imaging results that were available during my care of the patient were reviewed by me and considered in my medical decision making (see chart for details).    MDM Rules/Calculators/A&P                          BP 118/65 (BP Location: Right Arm)   Pulse 93   Temp 98.3 F (36.8 C) (Oral)   Resp (!) 34   SpO2 100%  Final Clinical Impression(s) / ED Diagnoses Final diagnoses:  Closed right hip fracture, initial encounter ([place])  Fall, initial encounter    Rx / DC Orders ED Discharge Orders    None     Patient fell landing on her right hip follows with severe right hip pain.  Concern for potential hip fracture/dislocation secondary to shortened externally rotated leg.  Sensation is intact, pulses intact.  X-rays ordered.  Basic labs ordered.  12:24 PM X-ray of the right hip demonstrate comminuted intertrochanteric femur fracture of the right with impaction and varus angulation at the fracture site as well as avulsion of the greater trochanter.  No dislocation.  This is a closed injury.  Patient is neurovascular intact.  Appreciate consultation from on-call orthopedist  PA, Hilbert Odor who is currently covering for orthopedist Dr. Doreatha Martin.  Plan to consult medicine for admission and surgical intervention likely tomorrow.  CAre discussed with Dr. Roslynn Amble.  Patient made aware of findings.  She is amenable for admission.  Screening EKG and chest x-ray as well as screening labs ordered.  1:01 PM Orthopedist plan on surgical intervention today and request medicine for admission.  1:53 PM I have consulted [facility] hospitalist, Dr. Tamala Julian who agrees to see and admit patient.   Screening COVID test is currently pending.   Domenic Moras, PA-C 04/29/20 1356    Lucrezia Starch, MD 05/02/20 [phone]

## 2020-04-30 ENCOUNTER — Inpatient Hospital Stay (HOSPITAL_COMMUNITY): Payer: 59

## 2020-04-30 ENCOUNTER — Inpatient Hospital Stay (HOSPITAL_COMMUNITY): Payer: 59 | Admitting: Certified Registered"

## 2020-04-30 ENCOUNTER — Encounter (HOSPITAL_COMMUNITY)
Admission: EM | Disposition: A | Discharge status: Skilled Nursing Facility | Payer: Self-pay | Source: Home / Self Care | Attending: Internal Medicine

## 2020-04-30 ENCOUNTER — Encounter (HOSPITAL_COMMUNITY): Payer: Self-pay | Admitting: Internal Medicine

## 2020-04-30 ENCOUNTER — Encounter (HOSPITAL_COMMUNITY): Payer: Medicare Other | Admitting: Cardiology

## 2020-04-30 DIAGNOSIS — I5032 Chronic diastolic (congestive) heart failure: Secondary | ICD-10-CM | POA: Diagnosis not present

## 2020-04-30 DIAGNOSIS — W19XXXA Unspecified fall, initial encounter: Secondary | ICD-10-CM

## 2020-04-30 DIAGNOSIS — J849 Interstitial pulmonary disease, unspecified: Secondary | ICD-10-CM

## 2020-04-30 DIAGNOSIS — C9210 Chronic myeloid leukemia, BCR/ABL-positive, not having achieved remission: Secondary | ICD-10-CM | POA: Diagnosis not present

## 2020-04-30 DIAGNOSIS — I959 Hypotension, unspecified: Secondary | ICD-10-CM

## 2020-04-30 DIAGNOSIS — S72141A Displaced intertrochanteric fracture of right femur, initial encounter for closed fracture: Secondary | ICD-10-CM | POA: Diagnosis not present

## 2020-04-30 HISTORY — PX: INTRAMEDULLARY (IM) NAIL INTERTROCHANTERIC: SHX5875

## 2020-04-30 LAB — SURGICAL PCR SCREEN
MRSA, PCR: NEGATIVE
Staphylococcus aureus: NEGATIVE

## 2020-04-30 LAB — CBC
HCT: 23.8 % — ABNORMAL LOW (ref 36.0–46.0)
HCT: 29 % — ABNORMAL LOW (ref 36.0–46.0)
Hemoglobin: 7.5 g/dL — ABNORMAL LOW (ref 12.0–15.0)
Hemoglobin: 8.9 g/dL — ABNORMAL LOW (ref 12.0–15.0)
MCH: 32.2 pg (ref 26.0–34.0)
MCH: 31.6 pg (ref 26.0–34.0)
MCHC: 31.5 g/dL (ref 30.0–36.0)
MCHC: 30.7 g/dL (ref 30.0–36.0)
MCV: 102.1 fL — ABNORMAL HIGH (ref 80.0–100.0)
MCV: 102.8 fL — ABNORMAL HIGH (ref 80.0–100.0)
Platelets: 43 10*3/uL — ABNORMAL LOW (ref 150–400)
Platelets: 71 10*3/uL — ABNORMAL LOW (ref 150–400)
RBC: 2.33 MIL/uL — ABNORMAL LOW (ref 3.87–5.11)
RBC: 2.82 MIL/uL — ABNORMAL LOW (ref 3.87–5.11)
RDW: 19 % — ABNORMAL HIGH (ref 11.5–15.5)
RDW: 19.3 % — ABNORMAL HIGH (ref 11.5–15.5)
WBC: 8.7 10*3/uL (ref 4.0–10.5)
WBC: 10.7 10*3/uL — ABNORMAL HIGH (ref 4.0–10.5)
nRBC: 4.8 % — ABNORMAL HIGH (ref 0.0–0.2)
nRBC: 4.1 % — ABNORMAL HIGH (ref 0.0–0.2)

## 2020-04-30 LAB — BASIC METABOLIC PANEL
Anion gap: 8 (ref 5–15)
BUN: 21 mg/dL (ref 8–23)
CO2: 26 mmol/L (ref 22–32)
Calcium: 9 mg/dL (ref 8.9–10.3)
Chloride: 104 mmol/L (ref 98–111)
Creatinine, Ser: 0.97 mg/dL (ref 0.44–1.00)
GFR, Estimated: >60 mL/min (ref >60)
Glucose, Bld: 164 mg/dL — ABNORMAL HIGH (ref 70–99)
Potassium: 4.4 mmol/L (ref 3.5–5.1)
Sodium: 138 mmol/L (ref 135–145)

## 2020-04-30 LAB — GLUCOSE, CAPILLARY
Glucose-Capillary: 144 mg/dL — ABNORMAL HIGH (ref 70–99)
Glucose-Capillary: 135 mg/dL — ABNORMAL HIGH (ref 70–99)
Glucose-Capillary: 146 mg/dL — ABNORMAL HIGH (ref 70–99)
Glucose-Capillary: 155 mg/dL — ABNORMAL HIGH (ref 70–99)

## 2020-04-30 LAB — PREPARE PLATELET PHERESIS: Unit division: 0

## 2020-04-30 LAB — BPAM PLATELET PHERESIS
Blood Product Expiration Date: 202201132359
ISSUE DATE / TIME: 202201131651
Unit Type and Rh: 5100

## 2020-04-30 LAB — PREPARE RBC (CROSSMATCH)

## 2020-04-30 LAB — PATHOLOGIST SMEAR REVIEW

## 2020-04-30 LAB — HEMOGLOBIN A1C
Hgb A1c MFr Bld: 6.7 % — ABNORMAL HIGH (ref 4.8–5.6)
Mean Plasma Glucose: 145.59 mg/dL

## 2020-04-30 SURGERY — FIXATION, FRACTURE, INTERTROCHANTERIC, WITH INTRAMEDULLARY ROD
Anesthesia: General | Site: Leg Upper | Laterality: Right

## 2020-04-30 MED ORDER — ROCURONIUM BROMIDE 10 MG/ML (PF) SYRINGE
PREFILLED_SYRINGE | INTRAVENOUS | Status: AC
  Filled 2020-04-30: qty 10

## 2020-04-30 MED ORDER — LIDOCAINE 2% (20 MG/ML) 5 ML SYRINGE
INTRAMUSCULAR | Status: DC | PRN
  Administered 2020-04-30: 60 mg via INTRAVENOUS

## 2020-04-30 MED ORDER — CHLORHEXIDINE GLUCONATE 0.12 % MT SOLN
15.0000 mL | Freq: Once | OROMUCOSAL | Status: DC
  Filled 2020-04-30: qty 15

## 2020-04-30 MED ORDER — ONDANSETRON HCL 4 MG/2ML IJ SOLN: 4.0000 mg | Freq: Four times a day (QID) | INTRAMUSCULAR | Status: DC | PRN

## 2020-04-30 MED ORDER — PROPOFOL 500 MG/50ML IV EMUL
INTRAVENOUS | Status: DC | PRN
  Administered 2020-04-30: 150 ug/kg/min via INTRAVENOUS

## 2020-04-30 MED ORDER — FENTANYL CITRATE (PF) 250 MCG/5ML IJ SOLN
INTRAMUSCULAR | Status: DC | PRN
  Administered 2020-04-30 (×2): 50 ug via INTRAVENOUS
  Administered 2020-04-30: 25 ug via INTRAVENOUS

## 2020-04-30 MED ORDER — ACETAMINOPHEN 10 MG/ML IV SOLN
INTRAVENOUS | Status: DC | PRN
  Administered 2020-04-30: 1000 mg via INTRAVENOUS

## 2020-04-30 MED ORDER — LACTATED RINGERS IV SOLN: INTRAVENOUS | Status: DC

## 2020-04-30 MED ORDER — ORAL CARE MOUTH RINSE: 15.0000 mL | Freq: Once | OROMUCOSAL | Status: DC

## 2020-04-30 MED ORDER — DOCUSATE SODIUM 100 MG PO CAPS
100.0000 mg | ORAL_CAPSULE | Freq: Two times a day (BID) | ORAL | Status: DC
  Administered 2020-04-30 – 2020-05-11 (×20): 100 mg via ORAL
  Filled 2020-04-30 (×21): qty 1

## 2020-04-30 MED ORDER — SUGAMMADEX SODIUM 200 MG/2ML IV SOLN
INTRAVENOUS | Status: DC | PRN
  Administered 2020-04-30: 200 mg via INTRAVENOUS

## 2020-04-30 MED ORDER — ESMOLOL HCL 100 MG/10ML IV SOLN
INTRAVENOUS | Status: AC
  Filled 2020-04-30: qty 10

## 2020-04-30 MED ORDER — KETAMINE HCL 10 MG/ML IJ SOLN
INTRAMUSCULAR | Status: DC | PRN
  Administered 2020-04-30: 10 mg via INTRAVENOUS
  Administered 2020-04-30 (×2): 20 mg via INTRAVENOUS

## 2020-04-30 MED ORDER — OXYCODONE HCL 5 MG PO TABS: 5.0000 mg | ORAL_TABLET | Freq: Once | ORAL | Status: DC | PRN

## 2020-04-30 MED ORDER — EPHEDRINE SULFATE-NACL 50-0.9 MG/10ML-% IV SOSY
PREFILLED_SYRINGE | INTRAVENOUS | Status: DC | PRN
  Administered 2020-04-30: 15 mg via INTRAVENOUS
  Administered 2020-04-30: 10 mg via INTRAVENOUS

## 2020-04-30 MED ORDER — FENTANYL CITRATE (PF) 250 MCG/5ML IJ SOLN
INTRAMUSCULAR | Status: AC
  Filled 2020-04-30: qty 5

## 2020-04-30 MED ORDER — MIDAZOLAM HCL 2 MG/2ML IJ SOLN
INTRAMUSCULAR | Status: AC
  Filled 2020-04-30: qty 2

## 2020-04-30 MED ORDER — LIDOCAINE 2% (20 MG/ML) 5 ML SYRINGE
INTRAMUSCULAR | Status: AC
  Filled 2020-04-30: qty 5

## 2020-04-30 MED ORDER — LACTATED RINGERS IV SOLN
INTRAVENOUS | Status: DC | PRN
Start: 2020-04-30 — End: 2020-04-30

## 2020-04-30 MED ORDER — SODIUM CHLORIDE 0.9 % IV SOLN: 10.0000 mL/h | Freq: Once | INTRAVENOUS | Status: DC

## 2020-04-30 MED ORDER — ACETAMINOPHEN 325 MG PO TABS
325.0000 mg | ORAL_TABLET | Freq: Four times a day (QID) | ORAL | Status: DC | PRN
  Administered 2020-05-01: 650 mg via ORAL
  Filled 2020-04-30 (×2): qty 2

## 2020-04-30 MED ORDER — CEFAZOLIN SODIUM-DEXTROSE 2-4 GM/100ML-% IV SOLN
2.0000 g | Freq: Three times a day (TID) | INTRAVENOUS | Status: AC
  Administered 2020-04-30 – 2020-05-01 (×3): 2 g via INTRAVENOUS
  Filled 2020-04-30 (×3): qty 100

## 2020-04-30 MED ORDER — OXYCODONE HCL 5 MG/5ML PO SOLN: 5.0000 mg | Freq: Once | ORAL | Status: DC | PRN

## 2020-04-30 MED ORDER — EPHEDRINE 5 MG/ML INJ
INTRAVENOUS | Status: AC
  Filled 2020-04-30: qty 10

## 2020-04-30 MED ORDER — PROPOFOL 10 MG/ML IV BOLUS
INTRAVENOUS | Status: AC
  Filled 2020-04-30: qty 20

## 2020-04-30 MED ORDER — POTASSIUM CHLORIDE IN NACL 20-0.9 MEQ/L-% IV SOLN
INTRAVENOUS | Status: DC
  Filled 2020-04-30 (×4): qty 1000

## 2020-04-30 MED ORDER — ONDANSETRON HCL 4 MG/2ML IJ SOLN: 4.0000 mg | Freq: Once | INTRAMUSCULAR | Status: DC | PRN

## 2020-04-30 MED ORDER — PHENYLEPHRINE HCL-NACL 10-0.9 MG/250ML-% IV SOLN
INTRAVENOUS | Status: DC | PRN
  Administered 2020-04-30: 40 ug/min via INTRAVENOUS

## 2020-04-30 MED ORDER — ONDANSETRON HCL 4 MG PO TABS
4.0000 mg | ORAL_TABLET | Freq: Four times a day (QID) | ORAL | Status: DC | PRN
  Administered 2020-05-07: 4 mg via ORAL
  Filled 2020-04-30: qty 1

## 2020-04-30 MED ORDER — FENTANYL CITRATE (PF) 100 MCG/2ML IJ SOLN
25.0000 ug | INTRAMUSCULAR | Status: DC | PRN
  Administered 2020-04-30 (×2): 25 ug via INTRAVENOUS

## 2020-04-30 MED ORDER — SUCCINYLCHOLINE CHLORIDE 200 MG/10ML IV SOSY
PREFILLED_SYRINGE | INTRAVENOUS | Status: AC
  Filled 2020-04-30: qty 10

## 2020-04-30 MED ORDER — MORPHINE SULFATE (PF) 2 MG/ML IV SOLN
0.5000 mg | INTRAVENOUS | Status: DC | PRN
  Administered 2020-04-30 – 2020-05-03 (×6): 1 mg via INTRAVENOUS
  Filled 2020-04-30 (×6): qty 1

## 2020-04-30 MED ORDER — POLYETHYLENE GLYCOL 3350 17 G PO PACK
17.0000 g | PACK | Freq: Every day | ORAL | Status: DC | PRN
  Administered 2020-05-06 – 2020-05-07 (×2): 17 g via ORAL
  Filled 2020-04-30 (×2): qty 1

## 2020-04-30 MED ORDER — MIDAZOLAM HCL 5 MG/5ML IJ SOLN
INTRAMUSCULAR | Status: DC | PRN
  Administered 2020-04-30: 2 mg via INTRAVENOUS

## 2020-04-30 MED ORDER — DEXTROSE 50 % IV SOLN: 12.5000 g | INTRAVENOUS | Status: DC | PRN

## 2020-04-30 MED ORDER — VANCOMYCIN HCL 1000 MG IV SOLR
INTRAVENOUS | Status: AC
  Filled 2020-04-30: qty 1000

## 2020-04-30 MED ORDER — OXYCODONE HCL 5 MG PO TABS
5.0000 mg | ORAL_TABLET | Freq: Four times a day (QID) | ORAL | Status: DC | PRN
  Administered 2020-05-03: 5 mg via ORAL
  Filled 2020-04-30: qty 1

## 2020-04-30 MED ORDER — ROCURONIUM BROMIDE 100 MG/10ML IV SOLN
INTRAVENOUS | Status: DC | PRN
  Administered 2020-04-30: 30 mg via INTRAVENOUS
  Administered 2020-04-30 (×2): 10 mg via INTRAVENOUS

## 2020-04-30 MED ORDER — METOCLOPRAMIDE HCL 5 MG/ML IJ SOLN
5.0000 mg | Freq: Three times a day (TID) | INTRAMUSCULAR | Status: DC | PRN
Start: 2020-04-30 — End: 2020-05-05

## 2020-04-30 MED ORDER — 0.9 % SODIUM CHLORIDE (POUR BTL) OPTIME
TOPICAL | Status: DC | PRN
  Administered 2020-04-30: 1000 mL

## 2020-04-30 MED ORDER — KETAMINE HCL 50 MG/5ML IJ SOSY
PREFILLED_SYRINGE | INTRAMUSCULAR | Status: AC
  Filled 2020-04-30: qty 5

## 2020-04-30 MED ORDER — PROPOFOL 10 MG/ML IV BOLUS
INTRAVENOUS | Status: DC | PRN
  Administered 2020-04-30: 80 mg via INTRAVENOUS

## 2020-04-30 MED ORDER — SODIUM CHLORIDE 0.9 % IV SOLN: INTRAVENOUS | Status: DC | PRN

## 2020-04-30 MED ORDER — VANCOMYCIN HCL 1000 MG IV SOLR
INTRAVENOUS | Status: DC | PRN
  Administered 2020-04-30: 1000 mg

## 2020-04-30 MED ORDER — ALBUMIN HUMAN 5 % IV SOLN
INTRAVENOUS | Status: DC | PRN
Start: 2020-04-30 — End: 2020-04-30

## 2020-04-30 MED ORDER — PROPOFOL 1000 MG/100ML IV EMUL
INTRAVENOUS | Status: AC
  Filled 2020-04-30: qty 200

## 2020-04-30 MED ORDER — ESMOLOL HCL 100 MG/10ML IV SOLN
INTRAVENOUS | Status: DC | PRN
  Administered 2020-04-30: 20 mg via INTRAVENOUS
  Administered 2020-04-30: 30 mg via INTRAVENOUS
  Administered 2020-04-30: 20 mg via INTRAVENOUS

## 2020-04-30 MED ORDER — SODIUM CHLORIDE 0.9 % IV SOLN
10.0000 mL/h | Freq: Once | INTRAVENOUS | Status: AC
  Administered 2020-04-30: 10 mL/h via INTRAVENOUS

## 2020-04-30 MED ORDER — METOCLOPRAMIDE HCL 5 MG PO TABS
5.0000 mg | ORAL_TABLET | Freq: Three times a day (TID) | ORAL | Status: DC | PRN
Start: 2020-04-30 — End: 2020-05-05

## 2020-04-30 MED ORDER — SUCCINYLCHOLINE CHLORIDE 200 MG/10ML IV SOSY
PREFILLED_SYRINGE | INTRAVENOUS | Status: DC | PRN
  Administered 2020-04-30: 120 mg via INTRAVENOUS

## 2020-04-30 MED ORDER — FENTANYL CITRATE (PF) 100 MCG/2ML IJ SOLN
INTRAMUSCULAR | Status: AC
  Filled 2020-04-30: qty 2

## 2020-04-30 MED ORDER — STERILE WATER FOR IRRIGATION IR SOLN
Status: DC | PRN
Start: 2020-04-30 — End: 2020-04-30
  Administered 2020-04-30: 1000 mL

## 2020-04-30 MED ORDER — ASPIRIN EC 81 MG PO TBEC
81.0000 mg | DELAYED_RELEASE_TABLET | Freq: Every day | ORAL | Status: DC
  Administered 2020-05-02 – 2020-05-11 (×9): 81 mg via ORAL
  Filled 2020-04-30 (×11): qty 1

## 2020-04-30 SURGICAL SUPPLY — 54 items
BIT DRILL INTERTAN LAG SCREW (BIT) ×1 IMPLANT
BIT DRILL SHORT 4.0 (BIT) IMPLANT
BRUSH SCRUB EZ PLAIN DRY (MISCELLANEOUS) ×3 IMPLANT
CHLORAPREP W/TINT 26 (MISCELLANEOUS) ×2 IMPLANT
COVER PERINEAL POST (MISCELLANEOUS) ×2 IMPLANT
COVER SURGICAL LIGHT HANDLE (MISCELLANEOUS) ×2 IMPLANT
COVER WAND RF STERILE (DRAPES) IMPLANT
DERMABOND ADVANCED (GAUZE/BANDAGES/DRESSINGS) ×2
DERMABOND ADVANCED .7 DNX12 (GAUZE/BANDAGES/DRESSINGS) ×1 IMPLANT
DRAPE C-ARM 35X43 STRL (DRAPES) ×2 IMPLANT
DRAPE C-ARMOR (DRAPES) ×1 IMPLANT
DRAPE IMP U-DRAPE 54X76 (DRAPES) ×4 IMPLANT
DRAPE INCISE IOBAN 66X45 STRL (DRAPES) ×1 IMPLANT
DRAPE ORTHO SPLIT 77X108 STRL (DRAPES) ×2
DRAPE STERI IOBAN 125X83 (DRAPES) ×1 IMPLANT
DRAPE SURG 17X23 STRL (DRAPES) ×4 IMPLANT
DRAPE SURG ORHT 6 SPLT 77X108 (DRAPES) IMPLANT
DRAPE U-SHAPE 47X51 STRL (DRAPES) ×2 IMPLANT
DRILL BIT SHORT 4.0 (BIT) ×1
DRSG MEPILEX BORDER 4X4 (GAUZE/BANDAGES/DRESSINGS) ×3 IMPLANT
DRSG MEPILEX BORDER 4X8 (GAUZE/BANDAGES/DRESSINGS) ×2 IMPLANT
ELECT REM PT RETURN 9FT ADLT (ELECTROSURGICAL) ×2
ELECTRODE REM PT RTRN 9FT ADLT (ELECTROSURGICAL) ×1 IMPLANT
GLOVE BIO SURGEON STRL SZ 6.5 (GLOVE) ×6 IMPLANT
GLOVE BIO SURGEON STRL SZ7.5 (GLOVE) ×11 IMPLANT
GLOVE BIOGEL PI IND STRL 6.5 (GLOVE) ×1 IMPLANT
GLOVE BIOGEL PI IND STRL 7.5 (GLOVE) ×1 IMPLANT
GLOVE BIOGEL PI INDICATOR 6.5 (GLOVE) ×1
GLOVE BIOGEL PI INDICATOR 7.5 (GLOVE) ×1
GOWN STRL REUS W/ TWL LRG LVL3 (GOWN DISPOSABLE) ×1 IMPLANT
GOWN STRL REUS W/TWL LRG LVL3 (GOWN DISPOSABLE) ×3
GUIDE PIN 3.2X343 (PIN) ×3
GUIDE PIN 3.2X343MM (PIN) ×3
GUIDE ROD 3.0 (MISCELLANEOUS) ×2
KIT BASIN OR (CUSTOM PROCEDURE TRAY) ×2 IMPLANT
KIT TURNOVER KIT B (KITS) ×2 IMPLANT
MANIFOLD NEPTUNE II (INSTRUMENTS) ×2 IMPLANT
NAIL LOCK CANN 10X360 130D RT (Nail) ×1 IMPLANT
NS IRRIG 1000ML POUR BTL (IV SOLUTION) ×2 IMPLANT
PACK GENERAL/GYN (CUSTOM PROCEDURE TRAY) ×2 IMPLANT
PAD ARMBOARD 7.5X6 YLW CONV (MISCELLANEOUS) ×4 IMPLANT
PIN GUIDE 3.2X343MM (PIN) IMPLANT
ROD GUIDE 3.0 (MISCELLANEOUS) IMPLANT
SCREW LAG COMPR KIT 95/90 (Screw) ×1 IMPLANT
SCREW TRIGEN LOW PROF 5.0X40 (Screw) ×1 IMPLANT
STAPLER VISISTAT 35W (STAPLE) ×1 IMPLANT
SUT MNCRL AB 3-0 PS2 18 (SUTURE) ×3 IMPLANT
SUT VIC AB 0 CT1 27 (SUTURE)
SUT VIC AB 0 CT1 27XBRD ANBCTR (SUTURE) IMPLANT
SUT VIC AB 2-0 CT1 27 (SUTURE) ×3
SUT VIC AB 2-0 CT1 TAPERPNT 27 (SUTURE) ×2 IMPLANT
TOWEL GREEN STERILE (TOWEL DISPOSABLE) ×3 IMPLANT
WATER STERILE IRR 1000ML POUR (IV SOLUTION) ×2 IMPLANT
YANKAUER SUCT BULB TIP NO VENT (SUCTIONS) ×1 IMPLANT

## 2020-04-30 NOTE — Anesthesia Procedure Notes (Addendum)
Procedure Name: Intubation Date/Time: 04/30/2020 12:39 PM Performed by: Hendricks Limes, CRNA Pre-anesthesia Checklist: Patient identified, Emergency Drugs available, Suction available and Patient being monitored Patient Re-evaluated:Patient Re-evaluated prior to induction Oxygen Delivery Method: Circle system utilized Preoxygenation: Pre-oxygenation with 100% oxygen Induction Type: IV induction Ventilation: Mask ventilation without difficulty Laryngoscope Size: Mac and 3 Grade View: Grade III Tube type: Oral Tube size: 7.0 mm Number of attempts: 1 Airway Equipment and Method: Stylet Placement Confirmation: ETT inserted through vocal cords under direct vision,  positive ETCO2 and breath sounds checked- equal and bilateral Tube secured with: Tape Dental Injury: Teeth and Oropharynx as per pre-operative assessment

## 2020-04-30 NOTE — Discharge Instructions (Signed)
Orthopaedic Trauma Service Discharge Instructions   General Discharge Instructions  WEIGHT BEARING STATUS: Weightbearing as tolerated right lower extremity  RANGE OF MOTION/ACTIVITY: OK for gentle hip and knee motion as tolerated  Wound Care: Incisions can be left open to air if there is no drainage. If incision continues to have drainage, follow wound care instructions below. Okay to shower if no drainage from incisions.  DVT/PE prophylaxis: Aspirin  Diet: as you were eating previously.  Can use over the counter stool softeners and bowel preparations, such as Miralax, to help with bowel movements.  Narcotics can be constipating.  Be sure to drink plenty of fluids  PAIN MEDICATION USE AND EXPECTATIONS  You have likely been given narcotic medications to help control your pain.  After a traumatic event that results in an fracture (broken bone) with or without surgery, it is ok to use narcotic pain medications to help control one's pain.  We understand that everyone responds to pain differently and each individual patient will be evaluated on a regular basis for the continued need for narcotic medications. Ideally, narcotic medication use should last no more than 6-8 weeks (coinciding with fracture healing).   As a patient it is your responsibility as well to monitor narcotic medication use and report the amount and frequency you use these medications when you come to your office visit.   We would also advise that if you are using narcotic medications, you should take a dose prior to therapy to maximize you participation.  IF YOU ARE ON NARCOTIC MEDICATIONS IT IS NOT PERMISSIBLE TO OPERATE A MOTOR VEHICLE (MOTORCYCLE/CAR/TRUCK/MOPED) OR HEAVY MACHINERY DO NOT MIX NARCOTICS WITH OTHER CNS (CENTRAL NERVOUS SYSTEM) DEPRESSANTS SUCH AS ALCOHOL   STOP SMOKING OR USING NICOTINE PRODUCTS!!!!  As discussed nicotine severely impairs your body's ability to heal surgical and traumatic wounds but also  impairs bone healing.  Wounds and bone heal by forming microscopic blood vessels (angiogenesis) and nicotine is a vasoconstrictor (essentially, shrinks blood vessels).  Therefore, if vasoconstriction occurs to these microscopic blood vessels they essentially disappear and are unable to deliver necessary nutrients to the healing tissue.  This is one modifiable factor that you can do to dramatically increase your chances of healing your injury.    (This means no smoking, no nicotine gum, patches, etc)  DO NOT USE NONSTEROIDAL ANTI-INFLAMMATORY DRUGS (NSAID'S)  Using products such as Advil (ibuprofen), Aleve (naproxen), Motrin (ibuprofen) for additional pain control during fracture healing can delay and/or prevent the healing response.  If you would like to take over the counter (OTC) medication, Tylenol (acetaminophen) is ok.  However, some narcotic medications that are given for pain control contain acetaminophen as well. Therefore, you should not exceed more than 4000 mg of tylenol in a day if you do not have liver disease.  Also note that there are may OTC medicines, such as cold medicines and allergy medicines that my contain tylenol as well.  If you have any questions about medications and/or interactions please ask your doctor/PA or your pharmacist.      ICE AND ELEVATE INJURED/OPERATIVE EXTREMITY  Using ice and elevating the injured extremity above your heart can help with swelling and pain control.  Icing in a pulsatile fashion, such as 20 minutes on and 20 minutes off, can be followed.    Do not place ice directly on skin. Make sure there is a barrier between to skin and the ice pack.    Using frozen items such as frozen peas  works well as the conform nicely to the are that needs to be iced.  USE AN ACE WRAP OR TED HOSE FOR SWELLING CONTROL  In addition to icing and elevation, Ace wraps or TED hose are used to help limit and resolve swelling.  It is recommended to use Ace wraps or TED hose until  you are informed to stop.    When using Ace Wraps start the wrapping distally (farthest away from the body) and wrap proximally (closer to the body)   Example: If you had surgery on your leg or thing and you do not have a splint on, start the ace wrap at the toes and work your way up to the thigh        If you had surgery on your upper extremity and do not have a splint on, start the ace wrap at your fingers and work your way up to the upper arm   [place]: [phone]   VISIT OUR WEBSITE FOR ADDITIONAL INFORMATION: [email or web address]

## 2020-04-30 NOTE — Progress Notes (Signed)
Patient transferred from PACU at 1815hrs.  C/o right hip pain 10/10.  Given PO vicodin per her request.  Will monitor for pain control.

## 2020-04-30 NOTE — Progress Notes (Signed)
Brief oncology note:  We were made aware of this patient's admission.  Asked to see due to thrombocytopenia.  I was unable to see the patient today as she has been in the OR.  Chart has been reviewed.  Platelet count has been 30-48,000 this admission.  She was given a unit of platelets perioperatively with an increase in her platelets 71,000.  She has also received 2 units PRBCs this admission.    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 ITP is like since platelets improved significantly with recent steroid use.  PLAN:  -Baseline platelet count in our office typically ranges between about 50-70,000.  She is down slightly in the 30-40,000 range.  -Recommend close monitoring of platelet count and transfuse platelets if platelet count less than 50,000 with active bleeding or less than 20,000 in the absence of bleeding. -No indication to begin treatment of CMML at this time. No lab or clinical evidence of significant progression. -will need treatment of her chronic ITP if her platelets persistently stay below 50,000 when she recovers from surgery. -avoid medications that can lower platelets  Please call oncology over the weekend if questions arise.  We will check on her early next week if she remains in the hospital.  Outpatient follow-up as already scheduled for 05/26/2020.  Mikey Bussing, DNP, AGPCNP-BC, AOCNP

## 2020-04-30 NOTE — Progress Notes (Signed)
PROGRESS NOTE    Kathy Irwin  FGH:[id number] DOB: 06-04-1950 DOA: 04/29/2020 PCP: Kerin Perna, NP  Brief Narrative:  HPI per Dr. Fuller Plan on 04/30/19 Kathy Irwin is a 70 y.o. female with medical history significant of HTN, interstitial lung disease, chronic respiratory failure with hypoxia on 2 L at night, DM type II, ITP,  CMML-1, and obesity presents after having a fall with right hip pain.  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  She landed on her right hip and reported severe pain thereafter.  Due to the pain she was unable to stand up or bear weight on the leg, and any kind of movement of that leg worsening pain.  At baseline patient currently feels short of breath is unchanged and she has been in her normal state of health otherwise prior to falling this morning.  She had not taken any of her home blood pressure medications.  ED Course: Admission as a emergency department patient was seen to be afebrile with respirations 30-34, and all other vital signs maintain. Labs significant for hemoglobin 10.5, platelets 30, and INR 1.3. X-rays revealed a communicated fracture of the right hip. Orthopedics was formally consulted with plans to take for surgical correction tomorrow. TRH called to admit.  **Interim History Was given another pack of platelets today and typed and screened and transfused 1 unit PRBCs. She underwent operative intervention and had intramedullary nail of the right femur fracture percutaneous section of the right femoral neck fracture.  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  Found to have a communicated intertrochanteric femur fracture on the right.  Orthopedics was consulted and plan on taking patient to surgery later this afternoon and this was done -Admit to medical telemetry bed -Hip Fracture order set utilized -Hydrocodone/morphine IV as needed for moderate-severe pain respectively -Appreciate orthopedics consultative services we will follow for further recommendations  Chronic ITP -Platelet count on admission 30,000 and trended up to 48,000 and then drop down to 43 again this morning.  Patient previously responsive to steroids in the past.  Discussed with Dr.Kale who recommended getting platelet count up to at least 50,000. Patient was typed and screened while in the emergency department -Transfuse 2 unit of platelets total and now platelet count postsurgery is 71,000 -Continue to monitor transfuse as needed to goal of at least 50,000 platelets prior to surgery -Appreciate oncology consultative services   Transient Hypotension -Acute. On admission patient blood pressures noted to drop as low as 78/41, but improved with IV bolus of 1.5 L normal saline IV fluids.  Patient reports that she had not taken her home medications of Prozac 20 mg daily or metoprolol 75 mg daily. -Hold home blood pressure medications  -Give additional IV fluids as needed -Continue monitor blood pressures per protocol  Chronic respiratory failure/interstitial lung disease -Patient reports that she chronically has  shortness of breath is unchanged from previous.  Only utilizing oxygen at night -SpO2: 95 % O2 Flow Rate (L/min): 4 L/min FiO2 (%): 40 % -Continue oxygen as needed -We will need an ambulatory home O2  screen prior to discharge  Macrocytic Anemia -Hemoglobin 10.5 on admission which appears near patient's baseline of 10-11 g/dL.  -Slowly started trending down and went down 7.5/23.8 and so he was typed and screened and transfused 1 unit PRBCs; could have been it partly dilutional drop given that she is getting IV fluids -Posttransfusion it was 8.9/29.0  CMML-1 -Patient not on treatment at this time.  Followed in the outpatient setting by Dr. Irene Limbo. -Continue outpatient follow-up  Diabetes mellitus type 2, controlled -Last hemoglobin A1c noted to be 5.6 on 02/05/2020.  Home medications include metformin 1000 mg daily and Amaryl 4 mg daily. -Hypoglycemic protocols -Check hemoglobin A1c in a.m. -Hold home oral medications -CBGs before every meal with sensitive SSI -CBGs ranging from 950-932  Chronic Diastolic CHF -Last EF was noted to be greater than 70-75% in 07/2019.  Patient appears to be euvolemic at this time. -Daily weights -Strict intake and output -She is +4.289 L since admission given her transfusions -Currently she is still getting normal saline at 75 MLS per hour  GERD -Continue home PPI with pantoprazole 40 minutes daily  Hyperlipidemia -Continue pravastatin 40 mg p.o. nightly  Morbid Obesity -Complicates overall prognosis and care -Estimated body mass index is 45.16 kg/m as calculated from the following:   Height as of this encounter: 5' 2.99" (1.6 m).   Weight as of this encounter: 115.6 kg.  -Weight Loss and Dietary Counseling given  DVT prophylaxis: SCDs for now on further care per Ortho Code Status: FULL CODE  Family Communication: No family present at bedside Disposition Plan: Pending evaluation by PT and OT clearance by orthopedic surgery  Status is: Inpatient  Remains inpatient appropriate because:IV treatments appropriate due to intensity of illness or inability to take PO and Inpatient level of care appropriate due to severity of  illness   Dispo: The patient is from: Home              Anticipated d/c is to: TBD              Anticipated d/c date is: 2 days              Patient currently is not medically stable to d/c.  Consultants:   Orthopedic Surgery  Procedures:   Antimicrobials:  Anti-infectives (From admission, onward)   Start     Dose/Rate Route Frequency Ordered Stop   04/30/20 0600  ceFAZolin (ANCEF) IVPB 2g/100 mL premix        2 g 200 mL/hr over 30 Minutes Intravenous On call to O.R. 04/29/20 2128 04/30/20 1257        Subjective: Seen and examined at bedside and states that her right hip is still hurting. Felt a little nauseous. No chest pain or shortness of breath but is wearing supplemental oxygen via nasal cannula. No other concerns at this time.  Objective: Vitals:   04/30/20 1004 04/30/20 1016 04/30/20 1031 04/30/20 1109  BP: (!) 94/40 (!) 98/49  (!) 145/53  Pulse: (!) 110   (!) 109  Resp: (!) 25 (!) 23  20  Temp:    98.3 F (36.8 C)  TempSrc:    Oral  SpO2: 92% (P) 92%  95%  Weight:   115.6 kg   Height:   _0  (1.6  m)     Intake/Output Summary (Last 24 hours) at 04/30/2020 1433 Last data filed at 04/30/2020 1412 Gross per 24 hour  Intake 2599.48 ml  Output 200 ml  Net 2399.48 ml   Filed Weights   04/29/20 2100 04/30/20 1031  Weight: 115.6 kg 115.6 kg   Examination: Physical Exam:  Constitutional: WN/WD morbidly obese AAF in mild distress and slightly uncomfortable Eyes: Lids and conjunctivae normal, sclerae anicteric  ENMT: External Ears, Nose appear normal. Grossly normal hearing.  Neck: Appears normal, supple, no cervical masses, normal ROM, no appreciable thyromegaly; no JVD Respiratory: Diminished to auscultation bilaterally, no wheezing, rales, rhonchi or crackles. Normal respiratory effort and patient is not tachypenic. No accessory muscle use. Wearing supplemental oxygen via nasal cannula Cardiovascular: RRR, no murmurs / rubs / gallops. S1 and S2 auscultated.  Mild lower extremity edema Abdomen: Soft, non-tender, distended secondary body habitus.  Bowel sounds positive.  GU: Deferred. Musculoskeletal: No clubbing / cyanosis of digits/nails. No joint deformity upper and lower extremities.  Skin: No rashes, lesions, ulcers on limited skin evaluation. No induration; Warm and dry.  Neurologic: CN 2-12 grossly intact with no focal deficits.  Romberg sign cerebellar reflexes not assessed.  Psychiatric: Normal judgment and insight. Alert and oriented x 3. Mildly anxious mood and appropriate affect.   Data Reviewed: I have personally reviewed following labs and imaging studies  CBC: Recent Labs  Lab 04/29/20 1205 04/29/20 2133 04/30/20 0334 04/30/20 1142  WBC 7.0 8.1 8.7 10.7*  NEUTROABS 1.6*  --   --   --   HGB 10.5* 8.2* 7.5* 8.9*  HCT 32.7* 25.6* 23.8* 29.0*  MCV 100.6* 100.8* 102.1* 102.8*  PLT 30* 48* 43* 71*   Basic Metabolic Panel: Recent Labs  Lab 04/29/20 1205 04/30/20 0334  NA 139 138  K 3.8 4.4  CL 106 104  CO2 24 26  GLUCOSE 145* 164*  BUN 13 21  CREATININE 0.68 0.97  CALCIUM 9.4 9.0   GFR: Estimated Creatinine Clearance: 67.1 mL/min (by C-G formula based on SCr of 0.97 mg/dL). Liver Function Tests: No results for input(s): AST, ALT, ALKPHOS, BILITOT, PROT, ALBUMIN in the last 168 hours. No results for input(s): LIPASE, AMYLASE in the last 168 hours. No results for input(s): AMMONIA in the last 168 hours. Coagulation Profile: Recent Labs  Lab 04/29/20 1205  INR 1.3*   Cardiac Enzymes: No results for input(s): CKTOTAL, CKMB, CKMBINDEX, TROPONINI in the last 168 hours. BNP (last 3 results) No results for input(s): PROBNP in the last 8760 hours. HbA1C: Recent Labs    04/30/20 0334  HGBA1C 6.7*   CBG: Recent Labs  Lab 04/29/20 1818 04/29/20 2217 04/30/20 0736 04/30/20 1055  GLUCAP 131* 153* 144* 135*   Lipid Profile: No results for input(s): CHOL, HDL, LDLCALC, TRIG, CHOLHDL, LDLDIRECT in the last 72  hours. Thyroid Function Tests: No results for input(s): TSH, T4TOTAL, FREET4, T3FREE, THYROIDAB in the last 72 hours. Anemia Panel: No results for input(s): VITAMINB12, FOLATE, FERRITIN, TIBC, IRON, RETICCTPCT in the last 72 hours. Sepsis Labs: No results for input(s): PROCALCITON, LATICACIDVEN in the last 168 hours.  Recent Results (from the past 240 hour(s))  Resp Panel by RT-PCR (Flu A&B, Covid) Nasopharyngeal Swab     Status: None   Collection Time: 04/29/20 12:59 PM   Specimen: Nasopharyngeal Swab; Nasopharyngeal(NP) swabs in vial transport medium  Result Value Ref Range Status   SARS Coronavirus 2 by RT PCR NEGATIVE NEGATIVE Final    Comment: (NOTE) SARS-CoV-2 target  nucleic acids are NOT DETECTED.  The SARS-CoV-2 RNA is generally detectable in upper respiratory specimens during the acute phase of infection. The lowest concentration of SARS-CoV-2 viral copies this assay can detect is 138 copies/mL. A negative result does not preclude SARS-Cov-2 infection and should not be used as the sole basis for treatment or other patient management decisions. A negative result may occur with  improper specimen collection/handling, submission of specimen other than nasopharyngeal swab, presence of viral mutation(s) within the areas targeted by this assay, and inadequate number of viral copies(<138 copies/mL). A negative result must be combined with clinical observations, patient history, and epidemiological information. The expected result is Negative.  Fact Sheet for Patients:  EntrepreneurPulse.com.au  Fact Sheet for Healthcare Providers:  IncredibleEmployment.be  This test is no t yet approved or cleared by the [place] FDA and  has been authorized for detection and/or diagnosis of SARS-CoV-2 by FDA under an Emergency Use Authorization (EUA). This EUA will remain  in effect (meaning this test can be used) for the duration of the COVID-19  declaration under Section 564(b)(1) of the Act, 21 U.S.C.section 360bbb-3(b)(1), unless the authorization is terminated  or revoked sooner.       Influenza A by PCR NEGATIVE NEGATIVE Final   Influenza B by PCR NEGATIVE NEGATIVE Final    Comment: (NOTE) The Xpert Xpress SARS-CoV-2/FLU/RSV plus assay is intended as an aid in the diagnosis of influenza from Nasopharyngeal swab specimens and should not be used as a sole basis for treatment. Nasal washings and aspirates are unacceptable for Xpert Xpress SARS-CoV-2/FLU/RSV testing.  Fact Sheet for Patients: EntrepreneurPulse.com.au  Fact Sheet for Healthcare Providers: IncredibleEmployment.be  This test is not yet approved or cleared by the [place] FDA and has been authorized for detection and/or diagnosis of SARS-CoV-2 by FDA under an Emergency Use Authorization (EUA). This EUA will remain in effect (meaning this test can be used) for the duration of the COVID-19 declaration under Section 564(b)(1) of the Act, 21 U.S.C. section 360bbb-3(b)(1), unless the authorization is terminated or revoked.  Performed at [facility], [place] [address], [place], [place] [address]   Surgical pcr screen     Status: None   Collection Time: 04/29/20 10:38 PM   Specimen: Nasal Mucosa; Nasal Swab  Result Value Ref Range Status   MRSA, PCR NEGATIVE NEGATIVE Final   Staphylococcus aureus NEGATIVE NEGATIVE Final    Comment: (NOTE) The Xpert SA Assay (FDA approved for NASAL specimens in patients 59 years of age and older), is one component of a comprehensive surveillance program. It is not intended to diagnose infection nor to guide or monitor treatment. Performed at [facility], [place] [address] [place], [place] [address]     RN Pressure Injury Documentation:     Estimated body mass index is 45.14 kg/m as calculated from the following:   Height as of this encounter: _0  (1.6 m).   Weight as  of this encounter: 115.6 kg.  Malnutrition Type:   Malnutrition Characteristics:   Nutrition Interventions:   Radiology Studies: DG Chest 1 View  Result Date: 04/29/2020 CLINICAL DATA:  Pain following fall EXAM: CHEST  1 VIEW COMPARISON:  October 15, 2019 FINDINGS: Lungs are clear. Heart size and pulmonary vascularity are normal. No pneumothorax. No adenopathy. No bone lesions. IMPRESSION: Lungs clear.  Cardiac silhouette normal. Electronically Signed   By: Lowella Grip III M.D.   On: 04/29/2020 12:03   DG Hip Unilat W or Wo Pelvis  2-3 Views Right  Result Date: 04/29/2020 CLINICAL DATA:  Pain following fall EXAM: DG HIP (WITH OR WITHOUT PELVIS) 2-3V RIGHT COMPARISON:  None. FINDINGS: Frontal pelvis as well as frontal and lateral right hip images were obtained. There is a comminuted intertrochanteric femur fracture on the right with impaction at the fracture site and varus angulation. There is avulsion of the greater trochanter on the right. No other fractures. No dislocation. There is no appreciable joint space narrowing. There is bony overgrowth along the superolateral aspect of each acetabulum. There is degenerative change in the lower lumbar spine. IMPRESSION: Comminuted intertrochanteric femur fracture on the right with impaction and varus angulation at the fracture site as well as avulsion of the greater trochanter. No other fracture. No dislocation. 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 Bony overgrowth along the superolateral aspect of each acetabulum, a finding potentially placing patient at increased risk for femoroacetabular impingement. Electronically Signed   By: Lowella Grip III M.D.   On: 04/29/2020 12:00   Scheduled Meds: . [MAR Hold] acetaminophen  1,000 mg Oral QHS  . chlorhexidine  15 mL Mouth/Throat Once   Or  . mouth rinse  15 mL Mouth Rinse Once  . [MAR Hold] famotidine  40 mg Oral Daily  . [MAR Hold] insulin aspart  0-9 Units Subcutaneous TID WC  .  [MAR Hold] montelukast  10 mg Oral QHS  . [MAR Hold] oxybutynin  5 mg Oral TID  . [MAR Hold] pantoprazole  40 mg Oral Daily  . [MAR Hold] pravastatin  40 mg Oral QHS  . [MAR Hold] pregabalin  100 mg Oral QHS  . [MAR Hold] Ensure Max Protein  11 oz Oral TID  . [MAR Hold] senna-docusate  1 tablet Oral Q0600   Continuous Infusions: . lactated ringers 10 mL/hr at 04/30/20 1135  . [MAR Hold] methocarbamol (ROBAXIN) IV      LOS: 1 day   Kerney Elbe, DO [facility] PAGER is on AMION  If 7PM-7AM, please contact night-coverage [email or web address]

## 2020-04-30 NOTE — Progress Notes (Signed)
   04/30/20 0813  Assess: MEWS Score  Temp 98.4 F (36.9 C)  BP (!) 93/50  Pulse Rate (!) 111  ECG Heart Rate (!) 107  Resp 18  Assess: MEWS Score  MEWS Temp 0  MEWS Systolic 1  MEWS Pulse 1  MEWS RR 0  MEWS LOC 0  MEWS Score 2  MEWS Score Color Yellow  Assess: if the MEWS score is Yellow or Red  Were vital signs taken at a resting state? Yes  Focused Assessment No change from prior assessment  Early Detection of Sepsis Score *See Row Information* Low  MEWS guidelines implemented *See Row Information* Yes  Treat  MEWS Interventions Other (Comment) (continue to monitor)  Take Vital Signs  Increase Vital Sign Frequency  Yellow: Q 2hr X 2 then Q 4hr X 2, if remains yellow, continue Q 4hrs  Escalate  MEWS: Escalate Yellow: discuss with charge nurse/RN and consider discussing with provider and RRT  Notify: Charge Nurse/RN  Name of Charge Nurse/RN Notified Abigail RN  Date Charge Nurse/RN Notified 04/30/20  Time Charge Nurse/RN Notified 0830  Notify: Provider  Provider Name/Title Dr. Alfredia Ferguson  Date Provider Notified 04/30/20  Time Provider Notified 0930  Notification Type Face-to-face  Notification Reason Other (Comment) (yellow mews)  Response No new orders  Date of Provider Response 04/30/20  Time of Provider Response 0930  Document  Patient Outcome Other (Comment) (Remains stable)  Progress note created (see row info) Yes

## 2020-04-30 NOTE — Transfer of Care (Signed)
Immediate Anesthesia Transfer of Care Note  Patient: Kathy Irwin  Procedure(s) Performed: INTRAMEDULLARY (IM) NAIL INTERTROCHANTRIC (Right Leg Upper)  Patient Location: PACU  Anesthesia Type:General  Level of Consciousness: awake, alert , oriented and patient cooperative  Airway & Oxygen Therapy: Patient Spontanous Breathing and Patient connected to face mask oxygen  Post-op Assessment: Report given to RN and Post -op Vital signs reviewed and stable  Post vital signs: Reviewed and stable  Last Vitals:  Vitals Value Taken Time  BP 97/47 04/30/20 1609  Temp    Pulse 117 04/30/20 1612  Resp 22 04/30/20 1612  SpO2 100 % 04/30/20 1612  Vitals shown include unvalidated device data.  Last Pain:  Vitals:   04/30/20 1109  TempSrc: Oral  PainSc:       Patients Stated Pain Goal: 2 (82/08/13 8871)  Complications: No complications documented.

## 2020-04-30 NOTE — Interval H&P Note (Signed)
History and Physical Interval Note:  04/30/2020 10:55 AM  Kathy Irwin  has presented today for surgery, with the diagnosis of Right hip fx.  The various methods of treatment have been discussed with the patient and family. After consideration of risks, benefits and other options for treatment, the patient has consented to  Procedure(s): INTRAMEDULLARY (IM) NAIL INTERTROCHANTRIC (Right) as a surgical intervention.  The patient's history has been reviewed, patient examined, no change in status, stable for surgery.  I have reviewed the patient's chart and labs.  Questions were answered to the patient's satisfaction.     Lennette Bihari P Danelly Hassinger

## 2020-04-30 NOTE — Progress Notes (Signed)
Received verbal order to run blood in rapidly from Dr. Doroteo Glassman.  Provider stayed at bedside until blood completed. Documentation for completion of blood completed.  Post Blood vitals obtained. Patient denies any symptoms of reaction of blood.  Post Blood CBC drew and sent down to lab for processing.

## 2020-04-30 NOTE — Anesthesia Postprocedure Evaluation (Signed)
Anesthesia Post Note  Patient: Kathy Irwin  Procedure(s) Performed: INTRAMEDULLARY (IM) NAIL INTERTROCHANTRIC (Right Leg Upper)     Patient location during evaluation: PACU Anesthesia Type: General Level of consciousness: awake and alert, oriented and patient cooperative Pain management: pain level controlled Vital Signs Assessment: post-procedure vital signs reviewed and stable Respiratory status: spontaneous breathing, nonlabored ventilation and respiratory function stable Cardiovascular status: blood pressure returned to baseline and stable Postop Assessment: no apparent nausea or vomiting Anesthetic complications: no Comments: Very slow to wake up, will treat pain with small doses of opiates    No complications documented.  Last Vitals:  Vitals:   04/30/20 1640 04/30/20 1655  BP: 121/66 101/66  Pulse: (!) 105 (!) 101  Resp: (!) 26 (!) 33  Temp: (!) 36.3 C   SpO2: 100% 100%    Last Pain:  Vitals:   04/30/20 1655  TempSrc:   PainSc: 10-Worst pain ever                 Pervis Hocking

## 2020-04-30 NOTE — Op Note (Signed)
Orthopaedic Surgery Operative Note (CSN: [id number] ) Date of Surgery: 04/30/2020  Admit Date: 04/29/2020   Diagnoses: Pre-Op Diagnoses: Right subtrochanteric femur fracture with basicervical femoral neck fracture  Post-Op Diagnosis: Same  Procedures: 1. CPT 27506-Intramedullary nailing of right femur fracture 2. CPT 27235-Percutaneous fixation of right femoral neck fracture  Surgeons : Primary: Gar Glance, Thomasene Lot, MD  Assistant: Patrecia Pace, PA-C  Location: OR 3   Anesthesia:General  Antibiotics: Ancef 2g preop with 1 gm vancomycin powder placed topically   Tourniquet time:None  Estimated Blood HYQM:578 mL  Complications:None   Specimens:None   Implants: Implant Name Type Inv. Item Serial No. Manufacturer Lot No. LRB No. Used Action  NAIL LOCK CANN [id number] 130D RT - [id number] Nail NAIL LOCK CANN 10X360 130D RT  [employer] [id number] Right 1 Implanted  SCREW LAG COMBO 95.90 - [id number] Screw SCREW LAG COMBO 95.90  [employer] [id number] Right 1 Implanted  SCREW TRIGEN LOW PROF 5.0X40 - [id number] Screw SCREW TRIGEN LOW PROF 5.0X40  [employer] [id number] Right 1 Implanted     Indications for Surgery: 70 year old female who sustained a ground-level fall and injured her right hip and femur.  She had a fracture that was unstable.  Due to the unstable nature of her injury I recommended proceeding with a cephalomedullary nailing.  Risks and benefits were discussed with the patient.  Risk include but not limited to bleeding, infection, malunion, nonunion, hardware failure, hardware irritation, nerve or blood vessel injury, DVT, even the possibility of anesthetic complications.  Patient agreed to proceed with surgery and consent was obtained.  Operative Findings: 1.  Significantly displaced right subtrochanteric femur fracture with significant involvement of the peritrochanteric region with a basicervical fracture component 2.  Open  reduction of a right subtrochanteric femur fracture with percutaneous provisional stabilization of basicervical femoral neck with threaded K wire 3.  Cephalomedullary nailing of right subtrochanteric femur fracture and right basicervical femoral neck fracture using [employer] 10 x 360 mm InterTAN with a 95 mm / 90 mm lag screw/compression screw combination  Procedure: The patient was identified in the preoperative holding area. Consent was confirmed with the patient and their family and all questions were answered. The operative extremity was marked after confirmation with the patient. she was then brought back to the operating room by our anesthesia colleagues.  She was placed under a general anesthetic and carefully transferred over to a Hana table.  All bony prominences were well-padded.  Fluoroscopic imaging was obtained.  Traction was applied.  Adequate alignment on the AP view was obtained however there is still significant displacement on the lateral view with nearly 2 femoral shaft width of displacement between the shaft and the intertrochanteric femur.  The right lower extremity was then prepped and draped in usual sterile fashion.  Timeout was performed verifying patient, procedure, and the extremity.  For started out by making a small proximal incision to the greater trochanter.  I split the gluteal fascia in line with my incision.  I then directed a threaded guidewire at the tip of the greater trochanter into the proximal metaphysis.  Once I confirmed positioning on AP and lateral fluoroscopic imaging I then used an entry reamer to enter the intertrochanteric metaphysis.  Multiple reduction maneuvers closed were attempted.  I placed a triangle underneath the femur.  I used my assistant to apply anterior translation to the distal shaft segment but no reduction maneuvers were able to get the fracture reduced closed.  At this point I made a small percutaneous incision and tried to manipulate the  shaft segment with a Cobb elevator and a bone hook.  Unfortunately I still was not able to get the shaft reduced to an appropriate alignment on the lateral view.  At this point I extended my incision to open up and visualize both the proximal segment and the shaft segment.  Again multiple attempts were made to try to get the shaft better aligned on the lateral view but unfortunately has continued to shift and shear posteriorly.  Eventually I decided to place a 5.0 mm Schanz pin into the shaft of the femur.  Drilled bicortically through the femur to be able to manipulate this with a T-handle chuck.  I was able to get a decent alignment.  I then used a finger reduction tool with the ball-tipped guidewire through the greater trochanter and was able to guide the ball-tipped guidewire down the center of the canal.  Unfortunately there is significant posterior comminution of the greater trochanter and proximal femur that prevented a reduction with a nail.  As result I felt that they needed to be something to block a Z-type reduction of the femoral shaft.  I decided to place a threaded guidewire in the head neck segment posterior to where the path of the nail would be in the proximal segment to prevent the nail from going through the comminution and causing posterior displacement of the fracture.  Once I placed this and passed the ball-tipped guidewire I then then reamed up to 11 mm after removing the Schanz pin.  I then passed the 10 mm nail down the center of the canal.  I chose to use a 360 mm nail.  I seated it appropriately and then proceeded to place traction through the shaft of the femur to get it out of varus.  Using the targeting arm I then directed a threaded guidewire into the head neck segment.  I confirmed adequate tip apex distance using AP and lateral fluoroscopic imaging.  I then drilled the path for the compression screw and placed an antirotation bar.  I then drilled the path for the lag screw.  I  had to remove the K wire that was holding the reduction previously.  I then placed a lag screw and I was able to compress approximately 1 cm to get adequate alignment on the AP and lateral view.  The proximal screws were statically locked to create a fixed angle device.  I then used perfect circle technique to place a lateral to medial distal interlocking screw.  Final fluoroscopic imaging was obtained.  The incision was copiously irrigated.  A gram of vancomycin powder was placed into the incision.  The IT band was closed with 0 Vicryl suture.  The skin was closed with 2-0 Vicryl and 3-0 Monocryl.  Dermabond was placed.  Sterile dressings were applied.  The patient was then awoken from anesthesia and taken to the PACU in stable condition.  Post Op Plan/Instructions: Patient will be weightbearing as tolerated to the right lower extremity.  I will likely recommend 81 mg aspirin for DVT prophylaxis due to her thrombocytopenia.  We will do Ancef for surgical prophylaxis.  We will have her mobilize with PT and OT.  I was present and performed the entire surgery.  Patrecia Pace, PA-C did assist me throughout the case. An assistant was necessary given the difficulty in approach, maintenance of reduction and ability to instrument the fracture.   Lennette Bihari Michael Walrath,  MD [facility]

## 2020-04-30 NOTE — Progress Notes (Signed)
When arrived on the unit, primary RN stated the pt is off the unit to OR

## 2020-04-30 NOTE — Progress Notes (Signed)
Patient taken to OR in bed with PRBCs infusing.  Report called to [phone].

## 2020-04-30 NOTE — Anesthesia Preprocedure Evaluation (Addendum)
Anesthesia Evaluation  Patient identified by MRN, date of birth, ID band Patient awake    Reviewed: Allergy & Precautions, NPO status , Patient's Chart, lab work & pertinent test results  Airway Mallampati: II  TM Distance: >3 FB Neck ROM: Full    Dental  (+) Teeth Intact, Dental Advisory Given   Pulmonary shortness of breath, with exertion and Long-Term Oxygen Therapy, pneumonia (12/2018 AHRF ), resolved,  Interstitial lung disease chronic respiratory failure with hypoxia on 2 L at night Mod pHTN   Pulmonary exam normal breath sounds clear to auscultation       Cardiovascular hypertension, Pt. on medications +CHF  Normal cardiovascular exam Rhythm:Regular Rate:Normal  Echo 07/2019: 1. Hyperdynamic LV. LV intracavitary gradient ~10 mmHG. Left ventricular  ejection fraction, by estimation, is 70 to 75%. The left ventricle has  hyperdynamic function. The left ventricle has no regional wall motion  abnormalities. Indeterminate diastolic  filling due to E-A fusion.  2. Right ventricular systolic function is normal. The right ventricular  size is mildly enlarged. There is moderately elevated pulmonary artery  systolic pressure. The estimated right ventricular systolic pressure is  53.3 mmHg.  3. The mitral valve is grossly normal. Trivial mitral valve  regurgitation. No evidence of mitral stenosis.  4. The aortic valve is grossly normal. Aortic valve regurgitation is not  visualized. Mild aortic valve sclerosis is present, with no evidence of  aortic valve stenosis.  5. The inferior vena cava is normal in size with greater than 50%  respiratory variability, suggesting right atrial pressure of 3 mmHg.    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   Anesthesia Other Findings   Reproductive/Obstetrics negative OB ROS                          Anesthesia Physical Anesthesia Plan  ASA: IV  Anesthesia Plan: General   Post-op Pain Management:    Induction: Intravenous and Rapid sequence  PONV Risk Score and Plan: 3 and Ondansetron, Dexamethasone and Treatment may vary due to age or medical condition  Airway Management Planned: Oral ETT  Additional Equipment: None  Intra-op Plan:   Post-operative Plan: Extubation in OR and Possible Post-op intubation/ventilation  Informed Consent: I have reviewed the patients History and Physical, chart, labs and discussed the procedure including the risks, benefits and alternatives for the proposed anesthesia with the patient or authorized representative who has indicated his/her understanding and acceptance.     Dental advisory given  Plan Discussed with: CRNA  Anesthesia Plan Comments: (Ordered 1unit prbc and 1 unit plt to be given prior to OR, will recheck CBC prior to OR )       Anesthesia Quick Evaluation

## 2020-04-30 NOTE — Progress Notes (Signed)
Lab contacted about patients STAT CBC, lab explained that they will put it in process now

## 2020-05-01 DIAGNOSIS — C9210 Chronic myeloid leukemia, BCR/ABL-positive, not having achieved remission: Secondary | ICD-10-CM | POA: Diagnosis not present

## 2020-05-01 DIAGNOSIS — I5032 Chronic diastolic (congestive) heart failure: Secondary | ICD-10-CM | POA: Diagnosis not present

## 2020-05-01 DIAGNOSIS — S72141A Displaced intertrochanteric fracture of right femur, initial encounter for closed fracture: Secondary | ICD-10-CM | POA: Diagnosis not present

## 2020-05-01 DIAGNOSIS — W19XXXA Unspecified fall, initial encounter: Secondary | ICD-10-CM | POA: Diagnosis not present

## 2020-05-01 LAB — PREPARE PLATELET PHERESIS: Unit division: 0

## 2020-05-01 LAB — COMPREHENSIVE METABOLIC PANEL
ALT: 11 U/L (ref 0–44)
AST: 32 U/L (ref 15–41)
Albumin: 2.9 g/dL — ABNORMAL LOW (ref 3.5–5.0)
Alkaline Phosphatase: 38 U/L (ref 38–126)
Anion gap: 8 (ref 5–15)
BUN: 27 mg/dL — ABNORMAL HIGH (ref 8–23)
CO2: 24 mmol/L (ref 22–32)
Calcium: 8.6 mg/dL — ABNORMAL LOW (ref 8.9–10.3)
Chloride: 103 mmol/L (ref 98–111)
Creatinine, Ser: 1.38 mg/dL — ABNORMAL HIGH (ref 0.44–1.00)
GFR, Estimated: 41 mL/min — ABNORMAL LOW (ref >60)
Glucose, Bld: 178 mg/dL — ABNORMAL HIGH (ref 70–99)
Potassium: 4.9 mmol/L (ref 3.5–5.1)
Sodium: 135 mmol/L (ref 135–145)
Total Bilirubin: 1 mg/dL (ref 0.3–1.2)
Total Protein: 5.1 g/dL — ABNORMAL LOW (ref 6.5–8.1)

## 2020-05-01 LAB — POCT I-STAT EG7
Acid-Base Excess: 1 mmol/L (ref 0.0–2.0)
Bicarbonate: 26.6 mmol/L (ref 20.0–28.0)
Calcium, Ion: 1.28 mmol/L (ref 1.15–1.40)
HCT: 23 % — ABNORMAL LOW (ref 36.0–46.0)
Hemoglobin: 7.8 g/dL — ABNORMAL LOW (ref 12.0–15.0)
O2 Saturation: 92 %
Potassium: 5.8 mmol/L — ABNORMAL HIGH (ref 3.5–5.1)
Sodium: 139 mmol/L (ref 135–145)
TCO2: 28 mmol/L (ref 22–32)
pCO2, Ven: 48.7 mmHg (ref 44.0–60.0)
pH, Ven: 7.346 (ref 7.250–7.430)
pO2, Ven: 67 mmHg — ABNORMAL HIGH (ref 32.0–45.0)

## 2020-05-01 LAB — GLUCOSE, CAPILLARY
Glucose-Capillary: 166 mg/dL — ABNORMAL HIGH (ref 70–99)
Glucose-Capillary: 169 mg/dL — ABNORMAL HIGH (ref 70–99)
Glucose-Capillary: 192 mg/dL — ABNORMAL HIGH (ref 70–99)
Glucose-Capillary: 169 mg/dL — ABNORMAL HIGH (ref 70–99)
Glucose-Capillary: 169 mg/dL — ABNORMAL HIGH (ref 70–99)

## 2020-05-01 LAB — CBC WITH DIFFERENTIAL/PLATELET
Abs Immature Granulocytes: 0.74 10*3/uL — ABNORMAL HIGH (ref 0.00–0.07)
Basophils Absolute: 0.1 10*3/uL (ref 0.0–0.1)
Basophils Relative: 1 %
Eosinophils Absolute: 0 10*3/uL (ref 0.0–0.5)
Eosinophils Relative: 0 %
HCT: 21.8 % — ABNORMAL LOW (ref 36.0–46.0)
Hemoglobin: 6.9 g/dL — CL (ref 12.0–15.0)
Immature Granulocytes: 7 %
Lymphocytes Relative: 22 %
Lymphs Abs: 2.2 10*3/uL (ref 0.7–4.0)
MCH: 30.5 pg (ref 26.0–34.0)
MCHC: 31.7 g/dL (ref 30.0–36.0)
MCV: 96.5 fL (ref 80.0–100.0)
Monocytes Absolute: 3.6 10*3/uL — ABNORMAL HIGH (ref 0.1–1.0)
Monocytes Relative: 35 %
Neutro Abs: 3.6 10*3/uL (ref 1.7–7.7)
Neutrophils Relative %: 35 %
Platelets: 47 10*3/uL — ABNORMAL LOW (ref 150–400)
RBC: 2.26 MIL/uL — ABNORMAL LOW (ref 3.87–5.11)
RDW: 20.9 % — ABNORMAL HIGH (ref 11.5–15.5)
WBC: 10.3 10*3/uL (ref 4.0–10.5)
nRBC: 6.9 % — ABNORMAL HIGH (ref 0.0–0.2)

## 2020-05-01 LAB — BPAM PLATELET PHERESIS
Blood Product Expiration Date: 202201142359
ISSUE DATE / TIME: 202201140752
Unit Type and Rh: 6200

## 2020-05-01 LAB — VITAMIN D 25 HYDROXY (VIT D DEFICIENCY, FRACTURES): Vit D, 25-Hydroxy: 60.43 ng/mL (ref 30–100)

## 2020-05-01 LAB — MAGNESIUM: Magnesium: 1.7 mg/dL (ref 1.7–2.4)

## 2020-05-01 LAB — PREPARE RBC (CROSSMATCH)

## 2020-05-01 LAB — PHOSPHORUS: Phosphorus: 4.6 mg/dL (ref 2.5–4.6)

## 2020-05-01 MED ORDER — SODIUM CHLORIDE 0.9% IV SOLUTION: Freq: Once | INTRAVENOUS | Status: DC

## 2020-05-01 MED ORDER — MUSCLE RUB 10-15 % EX CREA
TOPICAL_CREAM | CUTANEOUS | Status: DC | PRN
  Filled 2020-05-01: qty 85

## 2020-05-01 MED ORDER — MAGNESIUM SULFATE 2 GM/50ML IV SOLN
2.0000 g | Freq: Once | INTRAVENOUS | Status: AC
  Administered 2020-05-01: 2 g via INTRAVENOUS
  Filled 2020-05-01: qty 50

## 2020-05-01 NOTE — Evaluation (Signed)
Physical Therapy Evaluation Patient Details Name: Kathy Irwin MRN: [id number] DOB: 10/27/1950 Today's Date: 05/01/2020   History of Present Illness  70 yo female with mechanical fall was admitted for IM nailing of her R intertrochanteric fracture.  Has WBAT permitted, hgb 6.9.  Transfusing platelets and PRBC, has thrombocytopenia.  PMHx:  Home O2 use, respiratory failure, pneumonitis, DM, ILD, HTN  Clinical Impression  Pt was seen for initiation of mobility and was quite lethargic by the end of the session.  Initially more conversant, able to give some history.  By end of session was struggling to move with directional instructions and to initiate with any limb.  Her daughter is in attendance, and was in agreement with thoughts about going to rehab to finish her recovery of mobility as pt is living alone.  Follow acutely for goals of PT.    Follow Up Recommendations SNF    Equipment Recommendations  None recommended by PT    Recommendations for Other Services       Precautions / Restrictions Precautions Precautions: Fall Precaution Comments: lethargic Restrictions Weight Bearing Restrictions: Yes RLE Weight Bearing: Weight bearing as tolerated      Mobility  Bed Mobility Overal bed mobility: Needs Assistance Bed Mobility: Supine to Sit;Sit to Supine     Supine to sit: Max assist Sit to supine: Total assist   General bed mobility comments: pt is quite lethargic after getting up to side of bed, very difficult to get her attention on the sequence and elements needed to move    Transfers Overall transfer level: Needs assistance Equipment used: 1 person hand held assist Transfers: Lateral/Scoot Transfers          Lateral/Scoot Transfers: Max assist General transfer comment: repetitive cues for using UE's to assist scooting on bed pad with PT  Ambulation/Gait             General Gait Details: unsafe to attempt  Stairs            Wheelchair Mobility     Modified Rankin (Stroke Patients Only)       Balance                                             Pertinent Vitals/Pain Pain Assessment: Faces Faces Pain Scale: Hurts even more Pain Location: R leg with transition into and out of bed Pain Descriptors / Indicators: Operative site guarding Pain Intervention(s): Limited activity within patient's tolerance;Monitored during session;Premedicated before session;Repositioned    Home Living Family/patient expects to be discharged to:: Private residence Living Arrangements: Alone Available Help at Discharge: Available PRN/intermittently;Family Type of Home: House Home Access: Stairs to enter Entrance Stairs-Rails: Psychiatric nurse of Steps: 3 Home Layout: One level Home Equipment: Bedside commode;Walker - 4 wheels;Grab bars - toilet;Grab bars - tub/shower;Shower seat      Prior Function Level of Independence: Independent               Hand Dominance   Dominant Hand: Right    Extremity/Trunk Assessment   Upper Extremity Assessment Upper Extremity Assessment: Generalized weakness    Lower Extremity Assessment Lower Extremity Assessment: Generalized weakness    Cervical / Trunk Assessment Cervical / Trunk Assessment: Normal  Communication   Communication: No difficulties  Cognition Arousal/Alertness: Lethargic Behavior During Therapy: Flat affect Overall Cognitive Status: Impaired/Different from baseline Area of Impairment: Awareness;Problem solving;Following commands;Attention  Current Attention Level: Selective   Following Commands: Follows one step commands inconsistently;Follows one step commands with increased time   Awareness: Intellectual Problem Solving: Slow processing;Requires verbal cues;Requires tactile cues General Comments: pt cannot assist even to scoot up side of bed without contact and verbal cues repeated      General Comments  General comments (skin integrity, edema, etc.): pt is sleepy and following instructions poorly, apparently due to being lethargic.  Maintained O2 sats 90% or greater on 4L O2    Exercises     Assessment/Plan    PT Assessment Patient needs continued PT services  PT Problem List Decreased strength;Decreased range of motion;Decreased activity tolerance;Decreased balance;Decreased mobility;Decreased coordination;Decreased cognition;Decreased knowledge of use of DME;Decreased safety awareness;Cardiopulmonary status limiting activity;Obesity;Decreased skin integrity;Pain       PT Treatment Interventions DME instruction;Gait training;Functional mobility training;Stair training;Therapeutic activities;Therapeutic exercise;Balance training;Neuromuscular re-education;Patient/family education    PT Goals (Current goals can be found in the Care Plan section)  Acute Rehab PT Goals Patient Stated Goal: to get home PT Goal Formulation: With patient/family Time For Goal Achievement: 05/15/20 Potential to Achieve Goals: Good    Frequency Min 3X/week   Barriers to discharge Decreased caregiver support;Inaccessible home environment stairs to enter house with pt living alone    Co-evaluation               AM-PAC PT "6 Clicks" Mobility  Outcome Measure Help needed turning from your back to your side while in a flat bed without using bedrails?: A Lot Help needed moving from lying on your back to sitting on the side of a flat bed without using bedrails?: A Lot Help needed moving to and from a bed to a chair (including a wheelchair)?: Total Help needed standing up from a chair using your arms (e.g., wheelchair or bedside chair)?: Total Help needed to walk in hospital room?: Total Help needed climbing 3-5 steps with a railing? : Total 6 Click Score: 8    End of Session Equipment Utilized During Treatment: Gait belt;Oxygen Activity Tolerance: Patient limited by fatigue;Treatment limited secondary  to medical complications (Comment) Patient left: in bed;with call bell/phone within reach;with bed alarm set;with family/visitor present Nurse Communication: Mobility status PT Visit Diagnosis: Muscle weakness (generalized) (M62.81);Pain;History of falling (Z91.81) Pain - Right/Left: Right Pain - part of body: Hip    Time: 4944-9675 PT Time Calculation (min) (ACUTE ONLY): 26 min   Charges:   PT Evaluation $PT Eval Moderate Complexity: 1 Mod PT Treatments $Therapeutic Activity: 8-22 mins       Ramond Dial 05/01/2020, 1:49 PM  Mee Hives, PT MS Acute Rehab Dept. Number: [place] [facility] [place]

## 2020-05-01 NOTE — Progress Notes (Signed)
[facility] night shift.  The patient's hemoglobin level this morning is 6.9 g/dL, down from 8.9 g/dL yesterday morning.  A single unit PRBC transfusion has been ordered.  Tennis Must, MD

## 2020-05-01 NOTE — Progress Notes (Signed)
CRITICAL VALUE ALERT  Critical value received: HGB=6.9  Date of notification:  05/01/20  Time of notification:  0622  Critical value read back:Yes.    Nurse who received alert:  Stephanie Coup RN  MD notified (1st page):  Dr.Ortiz  Time of first page:  0629  MD notified (2nd page):  Time of second page:  Responding MD: Dr.Ortiz  Time MD responded:  [phone]

## 2020-05-01 NOTE — Progress Notes (Signed)
PROGRESS NOTE    Kathy Irwin  HLK:[id number] DOB: [birth year] DOA: 04/29/2020 PCP: Kerin Perna, NP  Brief Narrative:  HPI per Dr. Fuller Plan on 04/30/19 Kathy Irwin is a 70 y.o. female with medical history significant of HTN, interstitial lung disease, chronic respiratory failure with hypoxia on 2 L at night, DM type II, ITP,  CMML-1, and obesity presents after having a fall with right hip pain.  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  She landed on her right hip and reported severe pain thereafter.  Due to the pain she was unable to stand up or bear weight on the leg, and any kind of movement of that leg worsening pain.  At baseline patient currently feels short of breath is unchanged and she has been in her normal state of health otherwise prior to falling this morning.  She had not taken any of her home blood pressure medications.  ED Course: Admission as a emergency department patient was seen to be afebrile with respirations 30-34, and all other vital signs maintain. Labs significant for hemoglobin 10.5, platelets 30, and INR 1.3. X-rays revealed a communicated fracture of the right hip. Orthopedics was formally consulted with plans to take for surgical correction tomorrow. TRH called to admit.  **Interim History Was given another pack of platelets today (3 units total) and typed and screened and transfused 1 unit PRBCs again (2 units). She underwent operative intervention and had intramedullary nail of the right femur fracture percutaneous fixation of the right femoral neck fracture.  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  Found to have a communicated intertrochanteric femur fracture on the right.  Orthopedics was consulted and took the patient for Surgical Intervention as an outpatient  -Admit to medical telemetry bed -Hip Fracture order set utilized -Hydrocodone/morphine IV as needed for moderate-severe pain respectively -Appreciate orthopedics consultative services we will follow for further recommendations -Defer Pain Control and VTE prophylaxis to Ortho -PT recommending SNF; TOC consulted for assistance with Placement   Chronic ITP -Platelet count on admission 30,000 and trended up to 48,000 and then drop down to 43 again this morning.  Patient previously responsive to steroids in the past.  Discussed with Dr.Kale who recommended getting platelet count up to at least 50,000. Patient was typed and screened while in the emergency department -Transfuse 3 unit of platelets total and now platelet count postsurgery is 47,000 -Continue to monitor transfuse as needed to goal of at least 50,000 platelets prior to surgery -Appreciate oncology consultative services   Transient Hypotension -Acute. On admission patient blood pressures noted to drop as low as 78/41, but improved with IV bolus of 1.5 L normal saline IV fluids.  Patient reports that she had not taken her home medications of Prozac 20 mg daily or metoprolol 75 mg daily. -Hold home blood pressure medications  -Give additional IV  fluids as needed and now on NS Maintenance  -Hold all Hypertensive Medications -Continue monitor blood pressures per protocol  AKI -In the setting of Hypotension and Acute Surgical Intervention and  Anemia -Patient's Hgb/Hct has dropped from 10.5/32.7 on Admission and is now 6.9/21.8 this AM so could be from Hypovolemia -Getting IVF and Blood -Patient's BUN/Cr went from 13/0.68 -> 21/0.97 -> 27/1.38 -Avoid Nephrotoxic Medications, Contrast Dyes, Hypotension and Renally adjust medications -Repeat CMP in the AM   Chronic respiratory failure/interstitial lung disease -Patient reports that she chronically has shortness of breath is unchanged from previous.  Only utilizing oxygen at night -SpO2: 90 % O2 Flow Rate (L/min): 3 L/min FiO2 (%): 40 % -Continue oxygen as needed -We will need an ambulatory home O2 screen prior to discharge  Macrocytic Anemia/Acute Blood Loss Anemia in the Setting of Post-Surgical Drop -Hemoglobin 10.5 on admission which appears near patient's baseline of 10-11 g/dL.  -Slowly started trending down and went down 7.5/23.8 and so he was typed and screened and transfused 1 unit PRBCs; could have been it partly dilutional drop given that she is getting IV fluids -Posttransfusion it was 7.8/23.0 and has dropped further to 6.9/21.8 -Continue to Monitor for S/Sx of Bleeding; No overt bleeding noted  -Repeat CBC in the AM   CMML-1 -Patient not on treatment at this time.  Followed in the outpatient setting by Dr. Irene Limbo. -Oncology [place]  Diabetes mellitus type 2, controlled -Last hemoglobin A1c noted to be 5.6 on 02/05/2020.  Home medications include metformin 1000 mg daily and Amaryl 4 mg daily. -Hypoglycemic protocols -Check hemoglobin A1c in a.m. -Hold home oral medications -CBGs before every meal with sensitive SSI -CBGs ranging from 937-169  Chronic Diastolic CHF -Last EF was noted to be greater than 70-75% in 07/2019.  Patient appears to be euvolemic at this time. -Daily weights -Strict intake and output -She is +5.313 L since admission given her transfusions -Currently she is still getting normal saline at 75 MLS per hour  GERD -Continue home PPI  with pantoprazole 40 minutes daily  Hyperlipidemia -Continue pravastatin 40 mg p.o. nightly  Morbid Obesity -Complicates overall prognosis and care -Estimated body mass index is 46.17 kg/m as calculated from the following:   Height as of this encounter: 5' 2.99" (1.6 m).   Weight as of this encounter: 118.2 kg.  -Weight Loss and Dietary Counseling given  DVT prophylaxis: SCDs for now on further care per Ortho Code Status: FULL CODE  Family Communication: Discussed with family at bedside  Disposition Plan: Pending evaluation by PT and OT clearance by orthopedic surgery  Status is: Inpatient  Remains inpatient appropriate because:IV treatments appropriate due to intensity of illness or inability to take PO and Inpatient level of care appropriate due to severity of illness   Dispo: The patient is from: Home              Anticipated d/c is to: TBD              Anticipated d/c date is: 2 days              Patient currently is not medically stable to d/c.  Consultants:   Orthopedic Surgery  Procedures:   Antimicrobials:  Anti-infectives (From admission, onward)   Start     Dose/Rate Route Frequency Ordered Stop   04/30/20 2200  ceFAZolin (ANCEF) IVPB 2g/100 mL premix        2 g 200 mL/hr over 30 Minutes Intravenous Every 8 hours 04/30/20  1810 05/01/20 1443   04/30/20 1438  vancomycin (VANCOCIN) powder  Status:  Discontinued          As needed 04/30/20 1438 04/30/20 1603   04/30/20 0600  ceFAZolin (ANCEF) IVPB 2g/100 mL premix        2 g 200 mL/hr over 30 Minutes Intravenous On call to O.R. 04/29/20 2128 04/30/20 1257        Subjective: Seen and examined at bedside and states that her right hip is still hurting today.  Continues to be a little tachycardic.  No nausea or vomiting.  Did not feel as well.  No chest pain or shortness of breath.  100 supplement oxygen via nasal cannula.  Objective: Vitals:   05/01/20 1450 05/01/20 1505 05/01/20 1519 05/01/20 1600  BP: (!)  112/59 (!) 112/59 (!) 126/46   Pulse: (!) 132 (!) 130 (!) 135 (!) 138  Resp: (!) 23 (!) 31 (!) 28 (!) 31  Temp: 98.4 F (36.9 C)  97.6 F (36.4 C)   TempSrc: Axillary  Axillary   SpO2: 95% 92% 96% 90%  Weight:      Height:        Intake/Output Summary (Last 24 hours) at 05/01/2020 1652 Last data filed at 05/01/2020 1524 Gross per 24 hour  Intake 1623.47 ml  Output 600 ml  Net 1023.47 ml   Filed Weights   04/29/20 2100 04/30/20 1031 05/01/20 0425  Weight: 115.6 kg 115.6 kg 118.2 kg   Examination: Physical Exam:  Constitutional: WN/WD beast African-American female currently in mild distress appears uncomfortable Eyes: Lids and conjunctivae normal, sclerae anicteric  ENMT: External Ears, Nose appear normal. Grossly normal hearing.   Neck: Appears normal, supple, no cervical masses, normal ROM, no appreciable thyromegaly; no JVD Respiratory: Diminished to auscultation bilaterally, no wheezing, rales, rhonchi or crackles. Normal respiratory effort and patient is not tachypenic. No accessory muscle use.  Unlabored breathing Cardiovascular: Tachycardic rate but regular rhythm, no murmurs / rubs / gallops. S1 and S2 auscultated.  Abdomen: Soft, non-tender, distended secondary to body habitus.  Bowel sounds positive.  GU: Deferred. Musculoskeletal: No clubbing / cyanosis of digits/nails. No joint deformity upper and lower extremities.  Skin: No rashes, lesions, ulcers on limited skin evaluation. No induration; Warm and dry.  Neurologic: CN 2-12 grossly intact with no focal deficits. Romberg sign and cerebellar reflexes not assessed.  Psychiatric: Normal judgment and insight. Alert and oriented x 3. Normal mood and appropriate affect.   Data Reviewed: I have personally reviewed following labs and imaging studies  CBC: Recent Labs  Lab 04/29/20 1205 04/29/20 2133 04/30/20 0334 04/30/20 1142 04/30/20 1431 05/01/20 0301  WBC 7.0 8.1 8.7 10.7*  --  10.3  NEUTROABS 1.6*  --   --    --   --  3.6  HGB 10.5* 8.2* 7.5* 8.9* 7.8* 6.9*  HCT 32.7* 25.6* 23.8* 29.0* 23.0* 21.8*  MCV 100.6* 100.8* 102.1* 102.8*  --  96.5  PLT 30* 48* 43* 71*  --  47*   Basic Metabolic Panel: Recent Labs  Lab 04/29/20 1205 04/30/20 0334 04/30/20 1431 05/01/20 0301  NA 139 138 139 135  K 3.8 4.4 5.8* 4.9  CL 106 104  --  103  CO2 24 26  --  24  GLUCOSE 145* 164*  --  178*  BUN 13 21  --  27*  CREATININE 0.68 0.97  --  1.38*  CALCIUM 9.4 9.0  --  8.6*  MG  --   --   --  1.7  PHOS  --   --   --  4.6   GFR: Estimated Creatinine Clearance: 47.8 mL/min (A) (by C-G formula based on SCr of 1.38 mg/dL (H)). Liver Function Tests: Recent Labs  Lab 05/01/20 0301  AST 32  ALT 11  ALKPHOS 38  BILITOT 1.0  PROT 5.1*  ALBUMIN 2.9*   No results for input(s): LIPASE, AMYLASE in the last 168 hours. No results for input(s): AMMONIA in the last 168 hours. Coagulation Profile: Recent Labs  Lab 04/29/20 1205  INR 1.3*   Cardiac Enzymes: No results for input(s): CKTOTAL, CKMB, CKMBINDEX, TROPONINI in the last 168 hours. BNP (last 3 results) No results for input(s): PROBNP in the last 8760 hours. HbA1C: Recent Labs    04/30/20 0334  HGBA1C 6.7*   CBG: Recent Labs  Lab 04/30/20 1609 04/30/20 1822 04/30/20 2108 05/01/20 0748 05/01/20 1201  GLUCAP 166* 146* 155* 169* 192*   Lipid Profile: No results for input(s): CHOL, HDL, LDLCALC, TRIG, CHOLHDL, LDLDIRECT in the last 72 hours. Thyroid Function Tests: No results for input(s): TSH, T4TOTAL, FREET4, T3FREE, THYROIDAB in the last 72 hours. Anemia Panel: No results for input(s): VITAMINB12, FOLATE, FERRITIN, TIBC, IRON, RETICCTPCT in the last 72 hours. Sepsis Labs: No results for input(s): PROCALCITON, LATICACIDVEN in the last 168 hours.  Recent Results (from the past 240 hour(s))  Resp Panel by RT-PCR (Flu A&B, Covid) Nasopharyngeal Swab     Status: None   Collection Time: 04/29/20 12:59 PM   Specimen: Nasopharyngeal Swab;  Nasopharyngeal(NP) swabs in vial transport medium  Result Value Ref Range Status   SARS Coronavirus 2 by RT PCR NEGATIVE NEGATIVE Final    Comment: (NOTE) SARS-CoV-2 target nucleic acids are NOT DETECTED.  The SARS-CoV-2 RNA is generally detectable in upper respiratory specimens during the acute phase of infection. The lowest concentration of SARS-CoV-2 viral copies this assay can detect is 138 copies/mL. A negative result does not preclude SARS-Cov-2 infection and should not be used as the sole basis for treatment or other patient management decisions. A negative result may occur with  improper specimen collection/handling, submission of specimen other than nasopharyngeal swab, presence of viral mutation(s) within the areas targeted by this assay, and inadequate number of viral copies(<138 copies/mL). A negative result must be combined with clinical observations, patient history, and epidemiological information. The expected result is Negative.  Fact Sheet for Patients:  EntrepreneurPulse.com.au  Fact Sheet for Healthcare Providers:  IncredibleEmployment.be  This test is no t yet approved or cleared by the [place] FDA and  has been authorized for detection and/or diagnosis of SARS-CoV-2 by FDA under an Emergency Use Authorization (EUA). This EUA will remain  in effect (meaning this test can be used) for the duration of the COVID-19 declaration under Section 564(b)(1) of the Act, 21 U.S.C.section 360bbb-3(b)(1), unless the authorization is terminated  or revoked sooner.       Influenza A by PCR NEGATIVE NEGATIVE Final   Influenza B by PCR NEGATIVE NEGATIVE Final    Comment: (NOTE) The Xpert Xpress SARS-CoV-2/FLU/RSV plus assay is intended as an aid in the diagnosis of influenza from Nasopharyngeal swab specimens and should not be used as a sole basis for treatment. Nasal washings and aspirates are unacceptable for Xpert Xpress  SARS-CoV-2/FLU/RSV testing.  Fact Sheet for Patients: EntrepreneurPulse.com.au  Fact Sheet for Healthcare Providers: IncredibleEmployment.be  This test is not yet approved or cleared by the [place] FDA and has been authorized for detection and/or diagnosis of SARS-CoV-2  by FDA under an Emergency Use Authorization (EUA). This EUA will remain in effect (meaning this test can be used) for the duration of the COVID-19 declaration under Section 564(b)(1) of the Act, 21 U.S.C. section 360bbb-3(b)(1), unless the authorization is terminated or revoked.  Performed at [facility], [place] [address], [place], [place] [address]   Surgical pcr screen     Status: None   Collection Time: 04/29/20 10:38 PM   Specimen: Nasal Mucosa; Nasal Swab  Result Value Ref Range Status   MRSA, PCR NEGATIVE NEGATIVE Final   Staphylococcus aureus NEGATIVE NEGATIVE Final    Comment: (NOTE) The Xpert SA Assay (FDA approved for NASAL specimens in patients 72 years of age and older), is one component of a comprehensive surveillance program. It is not intended to diagnose infection nor to guide or monitor treatment. Performed at [facility], [place] [address], [place], [place] [address]     RN Pressure Injury Documentation:     Estimated body mass index is 46.17 kg/m as calculated from the following:   Height as of this encounter: 5' 2.99" (1.6 m).   Weight as of this encounter: 118.2 kg.  Malnutrition Type:   Malnutrition Characteristics:   Nutrition Interventions:   Radiology Studies: DG C-Arm 1-60 Min  Result Date: 04/30/2020 CLINICAL DATA:  Trauma.  Intramedullary nail right femur. EXAM: RIGHT FEMUR 2 VIEWS; DG C-ARM 1-60 MIN COMPARISON:  Radiographs April 29, 2020. FINDINGS: Fluoro time: 6 minutes and 38 seconds. Reported radiation: 166.89 mGy. Eleven C-arm fluoroscopic images were obtained intraoperatively and submitted for post operative  interpretation. These images demonstrate sequential changes associated with right intramedullary nail and screw fixation of a intertrochanteric femur fracture. Final images demonstrate improved, near anatomic alignment without unexpected findings. Please see the performing provider's procedural report for further detail. IMPRESSION: Intraoperative fluoroscopic imaging, as detailed above. Electronically Signed   By: Margaretha Sheffield MD   On: 04/30/2020 15:16   DG FEMUR, MIN 2 VIEWS RIGHT  Result Date: 04/30/2020 CLINICAL DATA:  Trauma.  Intramedullary nail right femur. EXAM: RIGHT FEMUR 2 VIEWS; DG C-ARM 1-60 MIN COMPARISON:  Radiographs April 29, 2020. FINDINGS: Fluoro time: 6 minutes and 38 seconds. Reported radiation: 166.89 mGy. Eleven C-arm fluoroscopic images were obtained intraoperatively and submitted for post operative interpretation. These images demonstrate sequential changes associated with right intramedullary nail and screw fixation of a intertrochanteric femur fracture. Final images demonstrate improved, near anatomic alignment without unexpected findings. Please see the performing provider's procedural report for further detail. IMPRESSION: Intraoperative fluoroscopic imaging, as detailed above. Electronically Signed   By: Margaretha Sheffield MD   On: 04/30/2020 15:16   DG FEMUR PORT, MIN 2 VIEWS RIGHT  Result Date: 04/30/2020 CLINICAL DATA:  Right hip ORIF, postoperative assessment EXAM: RIGHT FEMUR PORTABLE 2 VIEW COMPARISON:  02/28/2021 FINDINGS: Right intertrochanteric fracture ORIF noted with intramedullary nail component with distal interlocking screws, a long threaded axial femoral neck screw component, and a distal threaded femoral neck screw component. The patient has a known greater trochanteric fragment which is better aligned than on the preoperative radiographs. No new fracture or new complicating feature. Mild spurring of the right acetabulum noted. 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 IMPRESSION: Right intertrochanteric fracture ORIF without complicating feature identified. Electronically Signed   By: Van Clines M.D.   On: 04/30/2020 16:47   Scheduled Meds: . sodium chloride   Intravenous Once  . aspirin EC  81 mg Oral Daily  . docusate sodium  100 mg Oral BID  . famotidine  40 mg Oral Daily  . insulin aspart  0-9 Units Subcutaneous TID WC  . montelukast  10 mg Oral QHS  . oxybutynin  5 mg Oral TID  . pantoprazole  40 mg Oral Daily  . pravastatin  40 mg Oral QHS  . pregabalin  100 mg Oral QHS  . Ensure Max Protein  11 oz Oral TID  . senna-docusate  1 tablet Oral Q0600   Continuous Infusions: . 0.9 % NaCl with KCl 20 mEq / L 75 mL/hr at 05/01/20 1332  . methocarbamol (ROBAXIN) IV      LOS: 2 days   Kerney Elbe, DO [facility] PAGER is on AMION  If 7PM-7AM, please contact night-coverage [email or web address]

## 2020-05-01 NOTE — Plan of Care (Signed)
  Problem: Pain Management: Goal: Pain level will decrease Outcome: Progressing

## 2020-05-01 NOTE — Progress Notes (Signed)
Subjective: 1 Day Post-Op s/p Procedure(s): INTRAMEDULLARY (IM) NAIL INTERTROCHANTRIC  Reports left hip pain to be a 7/10 worse with any movement.  Denies chest pain,  Increased SOB, Calf pain. No nausea/vomiting. No other complaints.   Objective:  PE: VITALS:   Vitals:   05/01/20 0757 05/01/20 0937 05/01/20 0955 05/01/20 1014  BP:  124/65  134/68  Pulse: (!) 123 (!) 130 (!) 122 (!) 124  Resp: 19 (!) 22 (!) 24 (!) 27  Temp:  97.9 F (36.6 C)  99.1 F (37.3 C)  TempSrc:  Oral  Oral  SpO2: 96% 94% 96% 98%  Weight:      Height:       General: Alert, oriented, sitting up in bed Abdomen: soft, non-tender MSK: RLE - dressings CDI. Dorsiflexion and plantarflexion intact. Distal sensation intact. Foot warm and well perfused. TTP to proximal and distal femur. Calf compressible and nontender.   LABS  Results for orders placed or performed during the hospital encounter of 04/29/20 (from the past 24 hour(s))  Glucose, capillary     Status: Abnormal   Collection Time: 04/30/20 10:55 AM  Result Value Ref Range   Glucose-Capillary 135 (H) 70 - 99 mg/dL  CBC     Status: Abnormal   Collection Time: 04/30/20 11:42 AM  Result Value Ref Range   WBC 10.7 (H) 4.0 - 10.5 K/uL   RBC 2.82 (L) 3.87 - 5.11 MIL/uL   Hemoglobin 8.9 (L) 12.0 - 15.0 g/dL   HCT 29.0 (L) 36.0 - 46.0 %   MCV 102.8 (H) 80.0 - 100.0 fL   MCH 31.6 26.0 - 34.0 pg   MCHC 30.7 30.0 - 36.0 g/dL   RDW 19.3 (H) 11.5 - 15.5 %   Platelets 71 (L) 150 - 400 K/uL   nRBC 4.1 (H) 0.0 - 0.2 %  Glucose, capillary     Status: Abnormal   Collection Time: 04/30/20  6:22 PM  Result Value Ref Range   Glucose-Capillary 146 (H) 70 - 99 mg/dL  Glucose, capillary     Status: Abnormal   Collection Time: 04/30/20  9:08 PM  Result Value Ref Range   Glucose-Capillary 155 (H) 70 - 99 mg/dL  CBC with Differential/Platelet     Status: Abnormal   Collection Time: 05/01/20  3:01 AM  Result Value Ref Range   WBC 10.3 4.0 - 10.5 K/uL    RBC 2.26 (L) 3.87 - 5.11 MIL/uL   Hemoglobin 6.9 (LL) 12.0 - 15.0 g/dL   HCT 21.8 (L) 36.0 - 46.0 %   MCV 96.5 80.0 - 100.0 fL   MCH 30.5 26.0 - 34.0 pg   MCHC 31.7 30.0 - 36.0 g/dL   RDW 20.9 (H) 11.5 - 15.5 %   Platelets 47 (L) 150 - 400 K/uL   nRBC 6.9 (H) 0.0 - 0.2 %   Neutrophils Relative % 35 %   Neutro Abs 3.6 1.7 - 7.7 K/uL   Lymphocytes Relative 22 %   Lymphs Abs 2.2 0.7 - 4.0 K/uL   Monocytes Relative 35 %   Monocytes Absolute 3.6 (H) 0.1 - 1.0 K/uL   Eosinophils Relative 0 %   Eosinophils Absolute 0.0 0.0 - 0.5 K/uL   Basophils Relative 1 %   Basophils Absolute 0.1 0.0 - 0.1 K/uL   WBC Morphology VACUOLATED NEUTROPHILS    Immature Granulocytes 7 %   Abs Immature Granulocytes 0.74 (H) 0.00 - 0.07 K/uL   Tear Drop Cells PRESENT  Polychromasia PRESENT   Comprehensive metabolic panel     Status: Abnormal   Collection Time: 05/01/20  3:01 AM  Result Value Ref Range   Sodium 135 135 - 145 mmol/L   Potassium 4.9 3.5 - 5.1 mmol/L   Chloride 103 98 - 111 mmol/L   CO2 24 22 - 32 mmol/L   Glucose, Bld 178 (H) 70 - 99 mg/dL   BUN 27 (H) 8 - 23 mg/dL   Creatinine, Ser 1.38 (H) 0.44 - 1.00 mg/dL   Calcium 8.6 (L) 8.9 - 10.3 mg/dL   Total Protein 5.1 (L) 6.5 - 8.1 g/dL   Albumin 2.9 (L) 3.5 - 5.0 g/dL   AST 32 15 - 41 U/L   ALT 11 0 - 44 U/L   Alkaline Phosphatase 38 38 - 126 U/L   Total Bilirubin 1.0 0.3 - 1.2 mg/dL   GFR, Estimated 41 (L) >60 mL/min   Anion gap 8 5 - 15  Phosphorus     Status: None   Collection Time: 05/01/20  3:01 AM  Result Value Ref Range   Phosphorus 4.6 2.5 - 4.6 mg/dL  Magnesium     Status: None   Collection Time: 05/01/20  3:01 AM  Result Value Ref Range   Magnesium 1.7 1.7 - 2.4 mg/dL  Prepare RBC (crossmatch)     Status: None   Collection Time: 05/01/20  6:32 AM  Result Value Ref Range   Order Confirmation      ORDER PROCESSED BY BLOOD BANK Performed at [facility], [address] [place], [place] [address]   Glucose,  capillary     Status: Abnormal   Collection Time: 05/01/20  7:48 AM  Result Value Ref Range   Glucose-Capillary 169 (H) 70 - 99 mg/dL  Prepare Pheresed Platelets     Status: None (Preliminary result)   Collection Time: 05/01/20  8:44 AM  Result Value Ref Range   Unit Number [id number]    Blood Component Type PLTP2 PSORALEN TREATED    Unit division 00    Status of Unit ISSUED    Transfusion Status      OK TO TRANSFUSE Performed at [facility], [place] [address], [place], [place] [address]     DG Chest 1 View  Result Date: 04/29/2020 CLINICAL DATA:  Pain following fall EXAM: CHEST  1 VIEW COMPARISON:  October 15, 2019 FINDINGS: Lungs are clear. Heart size and pulmonary vascularity are normal. No pneumothorax. No adenopathy. No bone lesions. IMPRESSION: Lungs clear.  Cardiac silhouette normal. Electronically Signed   By: Lowella Grip III M.D.   On: 04/29/2020 12:03   DG C-Arm 1-60 Min  Result Date: 04/30/2020 CLINICAL DATA:  Trauma.  Intramedullary nail right femur. EXAM: RIGHT FEMUR 2 VIEWS; DG C-ARM 1-60 MIN COMPARISON:  Radiographs April 29, 2020. FINDINGS: Fluoro time: 6 minutes and 38 seconds. Reported radiation: 166.89 mGy. Eleven C-arm fluoroscopic images were obtained intraoperatively and submitted for post operative interpretation. These images demonstrate sequential changes associated with right intramedullary nail and screw fixation of a intertrochanteric femur fracture. Final images demonstrate improved, near anatomic alignment without unexpected findings. Please see the performing provider's procedural report for further detail. IMPRESSION: Intraoperative fluoroscopic imaging, as detailed above. Electronically Signed   By: Margaretha Sheffield MD   On: 04/30/2020 15:16   DG Hip Unilat W or Wo Pelvis 2-3 Views Right  Result Date: 04/29/2020 CLINICAL DATA:  Pain following fall EXAM: DG HIP (WITH OR WITHOUT PELVIS) 2-3V  RIGHT COMPARISON:  None. FINDINGS: Frontal pelvis as  well as frontal and lateral right hip images were obtained. There is a comminuted intertrochanteric femur fracture on the right with impaction at the fracture site and varus angulation. There is avulsion of the greater trochanter on the right. No other fractures. No dislocation. There is no appreciable joint space narrowing. There is bony overgrowth along the superolateral aspect of each acetabulum. There is degenerative change in the lower lumbar spine. IMPRESSION: Comminuted intertrochanteric femur fracture on the right with impaction and varus angulation at the fracture site as well as avulsion of the greater trochanter. No other fracture. No dislocation. 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 Bony overgrowth along the superolateral aspect of each acetabulum, a finding potentially placing patient at increased risk for femoroacetabular impingement. Electronically Signed   By: Lowella Grip III M.D.   On: 04/29/2020 12:00   DG FEMUR, MIN 2 VIEWS RIGHT  Result Date: 04/30/2020 CLINICAL DATA:  Trauma.  Intramedullary nail right femur. EXAM: RIGHT FEMUR 2 VIEWS; DG C-ARM 1-60 MIN COMPARISON:  Radiographs April 29, 2020. FINDINGS: Fluoro time: 6 minutes and 38 seconds. Reported radiation: 166.89 mGy. Eleven C-arm fluoroscopic images were obtained intraoperatively and submitted for post operative interpretation. These images demonstrate sequential changes associated with right intramedullary nail and screw fixation of a intertrochanteric femur fracture. Final images demonstrate improved, near anatomic alignment without unexpected findings. Please see the performing provider's procedural report for further detail. IMPRESSION: Intraoperative fluoroscopic imaging, as detailed above. Electronically Signed   By: Margaretha Sheffield MD   On: 04/30/2020 15:16   DG FEMUR PORT, MIN 2 VIEWS RIGHT  Result Date: 04/30/2020 CLINICAL DATA:  Right hip ORIF, postoperative assessment EXAM: RIGHT FEMUR  PORTABLE 2 VIEW COMPARISON:  02/28/2021 FINDINGS: Right intertrochanteric fracture ORIF noted with intramedullary nail component with distal interlocking screws, a long threaded axial femoral neck screw component, and a distal threaded femoral neck screw component. The patient has a known greater trochanteric fragment which is better aligned than on the preoperative radiographs. No new fracture or new complicating feature. Mild spurring of the right acetabulum noted. 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 IMPRESSION: Right intertrochanteric fracture ORIF without complicating feature identified. Electronically Signed   By: Van Clines M.D.   On: 04/30/2020 16:47    Assessment/Plan: Closed subtrochanteric femur fracture 1 Day Post-Op s/p Procedure(s): INTRAMEDULLARY (IM) NAIL INTERTROCHANTRIC  Acute Blood Loss Anemia: Hbg 6.9 this am, 1 unit PRBC given   Weightbearing: WBAT RLE Insicional and dressing care: Reinforce dressings as needed, plan to change dressings tomorrow VTE prophylaxis: 81 mg Hbg given patient's thrombocytopenia Pain control:  Tylenol 325-650 q 6 hours mild pain Hydrocodone 5/345m 1-2 tablets q 6 hours prn moderate pain Oxycodone 5 mg q 6 hours prn severe pain Morphine 0.5-151mIV q 3 hours prn severe pain Lyrica 10028maily at bedtime Follow - up plan: PT and OT eval pending  BlaVentura Bruns15/2022, 10:17 AM

## 2020-05-02 ENCOUNTER — Inpatient Hospital Stay (HOSPITAL_COMMUNITY): Payer: 59

## 2020-05-02 ENCOUNTER — Encounter (HOSPITAL_COMMUNITY): Payer: Self-pay | Admitting: Student

## 2020-05-02 DIAGNOSIS — W19XXXA Unspecified fall, initial encounter: Secondary | ICD-10-CM | POA: Diagnosis not present

## 2020-05-02 DIAGNOSIS — C9210 Chronic myeloid leukemia, BCR/ABL-positive, not having achieved remission: Secondary | ICD-10-CM | POA: Diagnosis not present

## 2020-05-02 DIAGNOSIS — A419 Sepsis, unspecified organism: Secondary | ICD-10-CM

## 2020-05-02 DIAGNOSIS — R6520 Severe sepsis without septic shock: Secondary | ICD-10-CM

## 2020-05-02 DIAGNOSIS — G9341 Metabolic encephalopathy: Secondary | ICD-10-CM

## 2020-05-02 DIAGNOSIS — G928 Other toxic encephalopathy: Secondary | ICD-10-CM

## 2020-05-02 DIAGNOSIS — S72141A Displaced intertrochanteric fracture of right femur, initial encounter for closed fracture: Secondary | ICD-10-CM | POA: Diagnosis not present

## 2020-05-02 DIAGNOSIS — J189 Pneumonia, unspecified organism: Secondary | ICD-10-CM

## 2020-05-02 DIAGNOSIS — I5032 Chronic diastolic (congestive) heart failure: Secondary | ICD-10-CM | POA: Diagnosis not present

## 2020-05-02 LAB — C-REACTIVE PROTEIN: CRP: 30 mg/dL — ABNORMAL HIGH (ref <1.0)

## 2020-05-02 LAB — PREPARE PLATELET PHERESIS: Unit division: 0

## 2020-05-02 LAB — CBC WITH DIFFERENTIAL/PLATELET
Abs Immature Granulocytes: 1.02 10*3/uL — ABNORMAL HIGH (ref 0.00–0.07)
Basophils Absolute: 0.1 10*3/uL (ref 0.0–0.1)
Basophils Relative: 1 %
Eosinophils Absolute: 0 10*3/uL (ref 0.0–0.5)
Eosinophils Relative: 0 %
HCT: 21 % — ABNORMAL LOW (ref 36.0–46.0)
Hemoglobin: 7.1 g/dL — ABNORMAL LOW (ref 12.0–15.0)
Immature Granulocytes: 7 %
Lymphocytes Relative: 14 %
Lymphs Abs: 2 10*3/uL (ref 0.7–4.0)
MCH: 32 pg (ref 26.0–34.0)
MCHC: 33.8 g/dL (ref 30.0–36.0)
MCV: 94.6 fL (ref 80.0–100.0)
Monocytes Absolute: 4.3 10*3/uL — ABNORMAL HIGH (ref 0.1–1.0)
Monocytes Relative: 31 %
Neutro Abs: 6.4 10*3/uL (ref 1.7–7.7)
Neutrophils Relative %: 47 %
Platelets: 65 10*3/uL — ABNORMAL LOW (ref 150–400)
RBC: 2.22 MIL/uL — ABNORMAL LOW (ref 3.87–5.11)
RDW: 18.8 % — ABNORMAL HIGH (ref 11.5–15.5)
WBC: 13.9 10*3/uL — ABNORMAL HIGH (ref 4.0–10.5)
nRBC: 5.8 % — ABNORMAL HIGH (ref 0.0–0.2)

## 2020-05-02 LAB — COMPREHENSIVE METABOLIC PANEL
ALT: 8 U/L (ref 0–44)
AST: 34 U/L (ref 15–41)
Albumin: 2.6 g/dL — ABNORMAL LOW (ref 3.5–5.0)
Alkaline Phosphatase: 43 U/L (ref 38–126)
Anion gap: 7 (ref 5–15)
BUN: 14 mg/dL (ref 8–23)
CO2: 24 mmol/L (ref 22–32)
Calcium: 8.6 mg/dL — ABNORMAL LOW (ref 8.9–10.3)
Chloride: 106 mmol/L (ref 98–111)
Creatinine, Ser: 0.71 mg/dL (ref 0.44–1.00)
GFR, Estimated: >60 mL/min (ref >60)
Glucose, Bld: 194 mg/dL — ABNORMAL HIGH (ref 70–99)
Potassium: 4.6 mmol/L (ref 3.5–5.1)
Sodium: 137 mmol/L (ref 135–145)
Total Bilirubin: 1.2 mg/dL (ref 0.3–1.2)
Total Protein: 5.3 g/dL — ABNORMAL LOW (ref 6.5–8.1)

## 2020-05-02 LAB — GLUCOSE, CAPILLARY
Glucose-Capillary: 168 mg/dL — ABNORMAL HIGH (ref 70–99)
Glucose-Capillary: 150 mg/dL — ABNORMAL HIGH (ref 70–99)
Glucose-Capillary: 170 mg/dL — ABNORMAL HIGH (ref 70–99)
Glucose-Capillary: 137 mg/dL — ABNORMAL HIGH (ref 70–99)

## 2020-05-02 LAB — BLOOD GAS, ARTERIAL
Acid-base deficit: 0.5 mmol/L (ref 0.0–2.0)
Bicarbonate: 23 mmol/L (ref 20.0–28.0)
Drawn by: 1375461
FIO2: 21
O2 Saturation: 80.5 %
Patient temperature: 37
pCO2 arterial: 33.7 mmHg (ref 32.0–48.0)
pH, Arterial: 7.45 (ref 7.350–7.450)
pO2, Arterial: 44.4 mmHg — ABNORMAL LOW (ref 83.0–108.0)

## 2020-05-02 LAB — MAGNESIUM: Magnesium: 2.1 mg/dL (ref 1.7–2.4)

## 2020-05-02 LAB — BPAM PLATELET PHERESIS
Blood Product Expiration Date: 202201152359
ISSUE DATE / TIME: 202201150945
Unit Type and Rh: 6200

## 2020-05-02 LAB — FIBRINOGEN: Fibrinogen: 708 mg/dL — ABNORMAL HIGH (ref 210–475)

## 2020-05-02 LAB — RESPIRATORY PANEL BY PCR

## 2020-05-02 LAB — PREPARE RBC (CROSSMATCH)

## 2020-05-02 LAB — LACTATE DEHYDROGENASE: LDH: 590 U/L — ABNORMAL HIGH (ref 98–192)

## 2020-05-02 LAB — FERRITIN: Ferritin: 1485 ng/mL — ABNORMAL HIGH (ref 11–307)

## 2020-05-02 LAB — D-DIMER, QUANTITATIVE: D-Dimer, Quant: 1.64 ug/mL-FEU — ABNORMAL HIGH (ref 0.00–0.50)

## 2020-05-02 LAB — PROCALCITONIN: Procalcitonin: 1.18 ng/mL

## 2020-05-02 LAB — RESP PANEL BY RT-PCR (FLU A&B, COVID) ARPGX2
Influenza A by PCR: NEGATIVE
Influenza B by PCR: NEGATIVE
SARS Coronavirus 2 by RT PCR: NEGATIVE

## 2020-05-02 LAB — LACTIC ACID, PLASMA: Lactic Acid, Venous: 1.8 mmol/L (ref 0.5–1.9)

## 2020-05-02 LAB — PHOSPHORUS: Phosphorus: 2.1 mg/dL — ABNORMAL LOW (ref 2.5–4.6)

## 2020-05-02 MED ORDER — DIPHENHYDRAMINE HCL 50 MG/ML IJ SOLN: 25.0000 mg | Freq: Once | INTRAMUSCULAR | Status: DC

## 2020-05-02 MED ORDER — VANCOMYCIN HCL 1750 MG/350ML IV SOLN
1750.0000 mg | INTRAVENOUS | Status: DC
  Administered 2020-05-03 – 2020-05-04 (×2): 1750 mg via INTRAVENOUS
  Filled 2020-05-02 (×3): qty 350

## 2020-05-02 MED ORDER — ALBUTEROL SULFATE HFA 108 (90 BASE) MCG/ACT IN AERS
1.0000 | INHALATION_SPRAY | RESPIRATORY_TRACT | Status: DC | PRN
  Filled 2020-05-02: qty 6.7

## 2020-05-02 MED ORDER — METOPROLOL TARTRATE 5 MG/5ML IV SOLN
5.0000 mg | Freq: Three times a day (TID) | INTRAVENOUS | Status: DC
  Administered 2020-05-02 – 2020-05-04 (×6): 5 mg via INTRAVENOUS
  Filled 2020-05-02 (×6): qty 5

## 2020-05-02 MED ORDER — SODIUM CHLORIDE 0.9 % IV BOLUS
500.0000 mL | Freq: Once | INTRAVENOUS | Status: AC
  Administered 2020-05-02: 500 mL via INTRAVENOUS

## 2020-05-02 MED ORDER — LEVALBUTEROL HCL 0.63 MG/3ML IN NEBU
0.6300 mg | INHALATION_SOLUTION | Freq: Four times a day (QID) | RESPIRATORY_TRACT | Status: DC
  Administered 2020-05-02: 0.63 mg via RESPIRATORY_TRACT
  Filled 2020-05-02: qty 3

## 2020-05-02 MED ORDER — METOPROLOL SUCCINATE ER 25 MG PO TB24
25.0000 mg | ORAL_TABLET | Freq: Every day | ORAL | Status: DC
  Filled 2020-05-02: qty 1

## 2020-05-02 MED ORDER — METOPROLOL SUCCINATE ER 50 MG PO TB24
75.0000 mg | ORAL_TABLET | Freq: Every day | ORAL | Status: DC
  Administered 2020-05-03 – 2020-05-11 (×9): 75 mg via ORAL
  Filled 2020-05-02 (×10): qty 1

## 2020-05-02 MED ORDER — IOHEXOL 350 MG/ML SOLN
150.0000 mL | Freq: Once | INTRAVENOUS | Status: AC | PRN
  Administered 2020-05-02: 150 mL via INTRAVENOUS

## 2020-05-02 MED ORDER — NALOXONE HCL 0.4 MG/ML IJ SOLN
0.4000 mg | INTRAMUSCULAR | Status: DC | PRN
  Administered 2020-05-02: 0.4 mg via INTRAVENOUS
  Filled 2020-05-02: qty 1

## 2020-05-02 MED ORDER — SODIUM CHLORIDE 0.9 % IV SOLN
2.0000 g | Freq: Three times a day (TID) | INTRAVENOUS | Status: DC
  Administered 2020-05-02 – 2020-05-10 (×25): 2 g via INTRAVENOUS
  Filled 2020-05-02 (×25): qty 2

## 2020-05-02 MED ORDER — IPRATROPIUM BROMIDE 0.02 % IN SOLN
0.5000 mg | Freq: Four times a day (QID) | RESPIRATORY_TRACT | Status: DC
  Administered 2020-05-02: 0.5 mg via RESPIRATORY_TRACT
  Filled 2020-05-02: qty 2.5

## 2020-05-02 MED ORDER — SODIUM CHLORIDE 0.9 % IV BOLUS: 500.0000 mL | Freq: Once | INTRAVENOUS | Status: DC

## 2020-05-02 MED ORDER — METOPROLOL SUCCINATE ER 50 MG PO TB24
50.0000 mg | ORAL_TABLET | Freq: Every day | ORAL | Status: DC
  Filled 2020-05-02: qty 1

## 2020-05-02 MED ORDER — POTASSIUM & SODIUM PHOSPHATES 280-160-250 MG PO PACK
1.0000 | PACK | Freq: Three times a day (TID) | ORAL | Status: DC
  Administered 2020-05-03 – 2020-05-11 (×31): 1 via ORAL
  Filled 2020-05-02 (×43): qty 1

## 2020-05-02 MED ORDER — SODIUM CHLORIDE 0.9% IV SOLUTION: Freq: Once | INTRAVENOUS | Status: DC

## 2020-05-02 MED ORDER — DIPHENHYDRAMINE HCL 50 MG/ML IJ SOLN
INTRAMUSCULAR | Status: AC
  Filled 2020-05-02: qty 1

## 2020-05-02 MED ORDER — VANCOMYCIN HCL 10 G IV SOLR
2500.0000 mg | Freq: Once | INTRAVENOUS | Status: AC
  Administered 2020-05-02: 2500 mg via INTRAVENOUS
  Filled 2020-05-02: qty 2500

## 2020-05-02 NOTE — Progress Notes (Signed)
OT Cancellation Note  Patient Details Name: Kathy Irwin MRN: [id number] DOB: 06-09-1950   Cancelled Treatment:    Reason Eval/Treat Not Completed: Medical issues which prohibited therapy;Patient not medically ready. RN and DO asked to hold until tomorrow pending head CT, Pt with increased lethargy and s/p narcan. OT will continue to follow for evaluation.   Merri Ray [name] 05/02/2020, 10:45 AM   Jesse Sans OTR/L Acute Rehabilitation Services Pager: [phone] Office: [phone]

## 2020-05-02 NOTE — Progress Notes (Signed)
Patient sitting at bedside and refused to go back to bed. Repeating, I'm in pain" and "I want to get up". Patient agreed to take pain medication then said "no, I don't want it." Remained in this situation with repeated encouragement to take medication and return to bed so we could replace Ice to right hip. After about 45 minutes we positioned patient in bed and gave MS IV. Ice bag was placed and patient sleeping when checked in 10 minutes.

## 2020-05-02 NOTE — Progress Notes (Addendum)
PROGRESS NOTE    Kathy Irwin  TKZ:[id number] DOB: Aug 05, 1950 DOA: 04/29/2020 PCP: Kerin Perna, NP  Brief Narrative:  HPI per Dr. Fuller Plan on 04/30/19 Kathy Irwin is a 70 y.o. female with medical history significant of HTN, interstitial lung disease, chronic respiratory failure with hypoxia on 2 L at night, DM type II, ITP,  CMML-1, and obesity presents after having a fall with right hip pain.  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  She landed on her right hip and reported severe pain thereafter.  Due to the pain she was unable to stand up or bear weight on the leg, and any kind of movement of that leg worsening pain.  At baseline patient currently feels short of breath is unchanged and she has been in her normal state of health otherwise prior to falling this morning.  She had not taken any of her home blood pressure medications.  ED Course: Admission as a emergency department patient was seen to be afebrile with respirations 30-34, and all other vital signs maintain. Labs significant for hemoglobin 10.5, platelets 30, and INR 1.3. X-rays revealed a communicated fracture of the right hip. Orthopedics was formally consulted with plans to take for surgical correction tomorrow. TRH called to admit.  **Interim History Was given another pack of platelets today (3 units total) and typed and screened and transfused 1 unit PRBCs again (3 units total). She underwent operative intervention and had intramedullary nail of the right femur fracture percutaneous fixation of the right femoral neck fracture.  This operatively she was doing okay but today she was extremely altered and encephalopathic.  She had decreased responsiveness and initially was thought from her narcotic medication so she was given some Narcan but this had minimal response.  ABG was checked and she was hypoxic.  Neurology  was consulted for further evaluation as well and they activated a code stroke with this was canceled later than on.  CT of the head showed no acute process and she also had a CTA of the head and neck.  Neurology recommending MRI given her hypoxia to rule out acute CVA.  Assessment & Plan:   Principal Problem:   Closed intertrochanteric fracture of hip, right, initial encounter [name]) Active Problems:   Interstitial lung disease ([place])   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[place])   Chronic diastolic CHF (congestive heart failure) ([facility])   Fall   Transient hypotension  Communicated intertrochanteric right femur fracture secondary to fall status post intramedullary ankle of the right femur fracture and percutaneous fixation of the right femoral neck fracture postoperative day 2 -Acute.  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  Found to have a communicated intertrochanteric femur fracture on the right.  Orthopedics was consulted and took the patient for Surgical Intervention as an outpatient  -Admit to medical telemetry bed -Hip Fracture order set utilized -Hydrocodone/morphine IV as needed for moderate-severe pain respectively.  This may need to be adjusted given her encephalopathy as below -Appreciate orthopedics consultative services we will follow for further recommendations -Defer Pain Control and VTE prophylaxis to Ortho -PT recommending SNF; TOC consulted for assistance with Placement   Chronic ITP -Platelet count on admission 30,000 and trended up to 48,000 and then drop down to 43 again this morning.  Patient previously responsive to steroids in the past.  Discussed  with Dr.Kale who recommended getting platelet count up to at least 50,000. Patient was typed and screened while in the emergency department -Transfuse 3 unit of platelets total and now platelet count postsurgery is 65,000 -Continue to monitor transfuse as needed to goal of at least 50,000  platelets prior to surgery -Appreciate oncology consultative services  and they will follow-up with the patient again tomorrow  Transient Hypotension -Acute. On admission patient blood pressures noted to drop as low as 78/41 yesterday, but improved with IV bolus of 1.5 L normal saline IV fluids.  Patient reports that she had not taken her home medications of Prozac 20 mg daily or metoprolol 75 mg daily. -Continue Hold home blood pressure medications  -Give additional IV fluids as needed and given 1.5 Liters todayand now on NS Maintenance  -Hold all Hypertensive Medications -Continue monitor blood pressures per protocol  Acute Encephalopathy in the setting of Hypoxia and Infection vs. Medication induced -Had AMS and was difficult to arouse this AM and Neurology evaluated for CODE Stroke -STAT Head CT done and showed no acute Process -CTA Head and Neck with No Emergent LVO  -CTA Chest with Multifocal PNA -ABG done and showed a PO2 of 44 -Given a Dose of Narcan given that she was given Narcotics for Pain; Narcan with minimum response -Will Treat Infection as below -Continue to Monitor and Trend -Neurology recommending MRI of Brain given Hypoxemia when Stable and reconsult if it is positive CVA -Obtain Cx as below -Correct Electrolytes if Necessary  -Continue to Follow   Severe Sepsis 2/2 to PNA, Likely present on Admission and worsened -Patient acutely decompensated today and worsened; Daughter states that prior to coming to the Hospital her mother had a new cough -DG Chest Xray on Admission showed "Lungs are clear. Heart size and pulmonary vascularity are normal. No pneumothorax. No adenopathy. No bone lesions." but likely did not show due to Dehydration   -WBC went from 10.3 -> 13.9; She is tachycardic, tachypenic and febrile now at 101.4 -CTA showed "There is no evidence of large central pulmonary embolus seen in the main pulmonary artery or main portions of the left and right pulmonary  arteries. Evaluation of the lower lobe branches of both pulmonary arteries is limited due to respiratory motion artifact and attenuation due to body habitus. Smaller and more peripheral pulmonary emboli cannot be excluded on the basis of this exam. 2. Patchy airspace opacities are noted throughout both lungs which may represent multifocal pneumonia."  -Give 1.5 Liter Bolus and a unit of Blood  -Checked COVID Again and Negative; Respiratory Virus Panel Negative As well  -PCT was 1.18; Will obtain Blood Cx x2, Check UA/Urine Cx -Start Xopenex/Atrovent -Repeat CXR in the AM -Empirically Start Abx with IV Vancomycin and IV Cefepime -Checked Inflammatory Markers and showed LDH of 590, Ferritin of 1485, CRP 30.0, LA of 1.8, Likely elevated in the setting of Multifocal PNA -Check SLP for concern for Aspiration  Sinus Tachycardia -Patient's HR Ranging from 130's-150's -D-Dimer was elevated at 1.64 -No PE on CTA -Given 1.5 Liters and will give a unit of Blood -Continue to Monitor BP -Physiologic Response from Above  Hypophosphatemia -Patient's Phos Level was 2.1 -Replete  -Continue to Monitor and Replete as Necessary -Repeat Phos Level in the AM  AKI, improved  -In the setting of Hypotension and Acute Surgical Intervention and Anemia -Patient's Hgb/Hct has dropped from 10.5/32.7 on Admission and is now 6.9/21.8 this AM so could be from Hypovolemia -Getting IVF and Blood -  Patient's BUN/Cr went from 13/0.68 -> 21/0.97 -> 27/1.38 -> 14/0.71 -Avoid Nephrotoxic Medications, Contrast Dyes, Hypotension and Renally adjust medications -Repeat CMP in the AM   Acute on Chronic respiratory failure/interstitial lung disease -Patient reports that she chronically has shortness of breath is unchanged from previous.  Only utilizing oxygen at night -SpO2: 98 % O2 Flow Rate (L/min): 3 L/min FiO2 (%): 32 % -Continue oxygen as needed -See as Above  -We will need an ambulatory home O2 screen prior to  discharge -May consider a Pulmonary Consult  Macrocytic Anemia/Acute Blood Loss Anemia in the Setting of Post-Surgical Drop -Hemoglobin 10.5 on admission which appears near patient's baseline of 10-11 g/dL.  -Slowly started trending down and went down 7.5/23.8 and so he was typed and screened and transfused 1 unit PRBCs; could have been it partly dilutional drop given that she is getting IV fluids -Posttransfusion it was 7.8/23.0 and has dropped further to 6.9/21.8 yesterday; Now Hgb/Hct is now 7.1/21.0 -Will give another 1 unit of pRBC; total of 3 units transfused this admission -Continue to Monitor for S/Sx of Bleeding; No overt bleeding noted  -Repeat CBC in the AM   CMML-1 -Patient not on treatment at this time.  Followed in the outpatient setting by Dr. Irene Limbo. -Oncology Consulted may need further intervention given  Diabetes mellitus type 2, controlled -Last hemoglobin A1c noted to be 5.6 on 02/05/2020.  Home medications include metformin 1000 mg daily and Amaryl 4 mg daily. -Hypoglycemic protocols -Check hemoglobin A1c in a.m. -Hold home oral medications -CBGs before every meal with sensitive SSI -CBGs ranging from 810-175  Chronic Diastolic CHF -Last EF was noted to be greater than 70-75% in 07/2019.  Patient appears to be euvolemic at this time. -Daily weights -Strict intake and output -She is +5.628 L since admission given her transfusions -Currently she is still getting normal saline at 75 MLS per hour  GERD -Continue home PPI with pantoprazole 40 minutes daily  Hyperlipidemia -Continue Pravastatin 40 mg p.o. nightly  Morbid Obesity -Complicates overall prognosis and care -Estimated body mass index is 45.94 kg/m as calculated from the following:   Height as of this encounter: 5' 2.99" (1.6 m).   Weight as of this encounter: 117.6 kg.  -Weight Loss and Dietary Counseling given  DVT prophylaxis: SCDs for now on further care per Ortho Code Status: FULL CODE   Family Communication: Discussed with daughter Mackey Birchwood over the telephone. Disposition Plan: Pending evaluation by PT and OT clearance by orthopedic surgery  Status is: Inpatient  Remains inpatient appropriate because:IV treatments appropriate due to intensity of illness or inability to take PO and Inpatient level of care appropriate due to severity of illness   Dispo: The patient is from: Home              Anticipated d/c is to: TBD              Anticipated d/c date is: 2 days              Patient currently is not medically stable to d/c.  Consultants:   Orthopedic Surgery  Neurology  Procedures:   Antimicrobials:  Anti-infectives (From admission, onward)   Start     Dose/Rate Route Frequency Ordered Stop   05/03/20 1400  vancomycin (VANCOREADY) IVPB 1750 mg/350 mL        1,750 mg 175 mL/hr over 120 Minutes Intravenous Every 24 hours 05/02/20 1317     05/02/20 1400  vancomycin (VANCOCIN) 2,500 mg in  sodium chloride 0.9 % 500 mL IVPB        2,500 mg 250 mL/hr over 120 Minutes Intravenous  Once 05/02/20 1305     05/02/20 1315  ceFEPIme (MAXIPIME) 2 g in sodium chloride 0.9 % 100 mL IVPB        2 g 200 mL/hr over 30 Minutes Intravenous Every 8 hours 05/02/20 1305     04/30/20 2200  ceFAZolin (ANCEF) IVPB 2g/100 mL premix        2 g 200 mL/hr over 30 Minutes Intravenous Every 8 hours 04/30/20 1810 05/01/20 1443   04/30/20 1438  vancomycin (VANCOCIN) powder  Status:  Discontinued          As needed 04/30/20 1438 04/30/20 1603   04/30/20 0600  ceFAZolin (ANCEF) IVPB 2g/100 mL premix        2 g 200 mL/hr over 30 Minutes Intravenous On call to O.R. 04/29/20 2128 04/30/20 1257        Subjective: Seen and examined at bedside and she was extremely encephalopathic today and difficult to arouse.  She will follow commands extremely slowly and was not appropriate.  She had pulled off her oxygen and was minimally responsive to Narcan.  Neurology was consulted for further  evaluation.  She is unable to provide a subjective history and then subsequently she spiked a temperature.  There was concern for COVID-19 however she was retested this was negative.  Likely she has a pneumonia and we will start her on HCAP coverage for now.  Daughter was updated.  No other concerns or plans at this time.  Objective: Vitals:   05/02/20 1158 05/02/20 1550 05/02/20 1558 05/02/20 1606  BP: (!) 133/51  (!) 115/50   Pulse: (!) 147     Resp: (!) 21 (!) 28 (!) 34   Temp: (!) 101.4 F (38.6 C)   100.2 F (37.9 C)  TempSrc: Axillary   Axillary  SpO2: 98%     Weight:      Height:        Intake/Output Summary (Last 24 hours) at 05/02/2020 1616 Last data filed at 05/01/2020 1847 Gross per 24 hour  Intake 315 ml  Output --  Net 315 ml   Filed Weights   04/30/20 1031 05/01/20 0425 05/02/20 0644  Weight: 115.6 kg 118.2 kg 117.6 kg   Examination: Physical Exam:  Constitutional: WN/WD obese African-American female currently extremely somnolent and difficult to arouse and not appropriate Eyes: Lids and conjunctivae normal, sclerae anicteric  ENMT: External Ears, Nose appear normal. Grossly normal hearing. Neck: Appears normal, supple, no cervical masses, normal ROM, no appreciable thyromegaly; no appreciable JVD Respiratory: Diminished to auscultation bilaterally with coarse breath sounds, no wheezing, rales, rhonchi or crackles. Normal respiratory effort and patient is not tachypenic. No accessory muscle use.  She is not wearing her supplemental oxygen via nasal cannula now. Cardiovascular: RRR, no murmurs / rubs / gallops. S1 and S2 auscultated.  Trace to 1+ lower extremity edema Abdomen: Soft, non-tender, distended secondary body habitus. Bowel sounds positive.  GU: Deferred. Musculoskeletal: No clubbing / cyanosis of digits/nails. No joint deformity upper and lower extremities.   Skin: No rashes, lesions, ulcers on limited skin evaluation. No induration; Warm and dry.   Neurologic: She follows commands extremely slowly. Psychiatric: Impaired judgment and insight.  She is extremely somnolent and drowsy and not fully alert and oriented x 3.  Slightly agitated mood and appropriate affect.   Data Reviewed: I have personally reviewed following labs and  imaging studies  CBC: Recent Labs  Lab 04/29/20 1205 04/29/20 2133 04/30/20 0334 04/30/20 1142 04/30/20 1431 05/01/20 0301 05/02/20 0224  WBC 7.0 8.1 8.7 10.7*  --  10.3 13.9*  NEUTROABS 1.6*  --   --   --   --  3.6 6.4  HGB 10.5* 8.2* 7.5* 8.9* 7.8* 6.9* 7.1*  HCT 32.7* 25.6* 23.8* 29.0* 23.0* 21.8* 21.0*  MCV 100.6* 100.8* 102.1* 102.8*  --  96.5 94.6  PLT 30* 48* 43* 71*  --  47* 65*   Basic Metabolic Panel: Recent Labs  Lab 04/29/20 1205 04/30/20 0334 04/30/20 1431 05/01/20 0301 05/02/20 0224  NA 139 138 139 135 137  K 3.8 4.4 5.8* 4.9 4.6  CL 106 104  --  103 106  CO2 24 26  --  24 24  GLUCOSE 145* 164*  --  178* 194*  BUN 13 21  --  27* 14  CREATININE 0.68 0.97  --  1.38* 0.71  CALCIUM 9.4 9.0  --  8.6* 8.6*  MG  --   --   --  1.7 2.1  PHOS  --   --   --  4.6 2.1*   GFR: Estimated Creatinine Clearance: 82.2 mL/min (by C-G formula based on SCr of 0.71 mg/dL). Liver Function Tests: Recent Labs  Lab 05/01/20 0301 05/02/20 0224  AST 32 34  ALT 11 8  ALKPHOS 38 43  BILITOT 1.0 1.2  PROT 5.1* 5.3*  ALBUMIN 2.9* 2.6*   No results for input(s): LIPASE, AMYLASE in the last 168 hours. No results for input(s): AMMONIA in the last 168 hours. Coagulation Profile: Recent Labs  Lab 04/29/20 1205  INR 1.3*   Cardiac Enzymes: No results for input(s): CKTOTAL, CKMB, CKMBINDEX, TROPONINI in the last 168 hours. BNP (last 3 results) No results for input(s): PROBNP in the last 8760 hours. HbA1C: Recent Labs    04/30/20 0334  HGBA1C 6.7*   CBG: Recent Labs  Lab 05/01/20 1201 05/01/20 1709 05/01/20 2104 05/02/20 0748 05/02/20 1256  GLUCAP 192* 169* 169* 168* 150*   Lipid  Profile: No results for input(s): CHOL, HDL, LDLCALC, TRIG, CHOLHDL, LDLDIRECT in the last 72 hours. Thyroid Function Tests: No results for input(s): TSH, T4TOTAL, FREET4, T3FREE, THYROIDAB in the last 72 hours. Anemia Panel: Recent Labs    05/02/20 1435  FERRITIN 1,485*   Sepsis Labs: Recent Labs  Lab 05/02/20 1435  PROCALCITON 1.18  LATICACIDVEN 1.8    Recent Results (from the past 240 hour(s))  Resp Panel by RT-PCR (Flu A&B, Covid) Nasopharyngeal Swab     Status: None   Collection Time: 04/29/20 12:59 PM   Specimen: Nasopharyngeal Swab; Nasopharyngeal(NP) swabs in vial transport medium  Result Value Ref Range Status   SARS Coronavirus 2 by RT PCR NEGATIVE NEGATIVE Final    Comment: (NOTE) SARS-CoV-2 target nucleic acids are NOT DETECTED.  The SARS-CoV-2 RNA is generally detectable in upper respiratory specimens during the acute phase of infection. The lowest concentration of SARS-CoV-2 viral copies this assay can detect is 138 copies/mL. A negative result does not preclude SARS-Cov-2 infection and should not be used as the sole basis for treatment or other patient management decisions. A negative result may occur with  improper specimen collection/handling, submission of specimen other than nasopharyngeal swab, presence of viral mutation(s) within the areas targeted by this assay, and inadequate number of viral copies(<138 copies/mL). A negative result must be combined with clinical observations, patient history, and epidemiological information. The expected result  is Negative.  Fact Sheet for Patients:  EntrepreneurPulse.com.au  Fact Sheet for Healthcare Providers:  IncredibleEmployment.be  This test is no t yet approved or cleared by the [place] FDA and  has been authorized for detection and/or diagnosis of SARS-CoV-2 by FDA under an Emergency Use Authorization (EUA). This EUA will remain  in effect (meaning this test can  be used) for the duration of the COVID-19 declaration under Section 564(b)(1) of the Act, 21 U.S.C.section 360bbb-3(b)(1), unless the authorization is terminated  or revoked sooner.       Influenza A by PCR NEGATIVE NEGATIVE Final   Influenza B by PCR NEGATIVE NEGATIVE Final    Comment: (NOTE) The Xpert Xpress SARS-CoV-2/FLU/RSV plus assay is intended as an aid in the diagnosis of influenza from Nasopharyngeal swab specimens and should not be used as a sole basis for treatment. Nasal washings and aspirates are unacceptable for Xpert Xpress SARS-CoV-2/FLU/RSV testing.  Fact Sheet for Patients: EntrepreneurPulse.com.au  Fact Sheet for Healthcare Providers: IncredibleEmployment.be  This test is not yet approved or cleared by the [place] FDA and has been authorized for detection and/or diagnosis of SARS-CoV-2 by FDA under an Emergency Use Authorization (EUA). This EUA will remain in effect (meaning this test can be used) for the duration of the COVID-19 declaration under Section 564(b)(1) of the Act, 21 U.S.C. section 360bbb-3(b)(1), unless the authorization is terminated or revoked.  Performed at [facility], [place] [address], [place], [place] [address]   Surgical pcr screen     Status: None   Collection Time: 04/29/20 10:38 PM   Specimen: Nasal Mucosa; Nasal Swab  Result Value Ref Range Status   MRSA, PCR NEGATIVE NEGATIVE Final   Staphylococcus aureus NEGATIVE NEGATIVE Final    Comment: (NOTE) The Xpert SA Assay (FDA approved for NASAL specimens in patients 27 years of age and older), is one component of a comprehensive surveillance program. It is not intended to diagnose infection nor to guide or monitor treatment. Performed at [facility], [place] [address] [place], [place] [address]   Resp Panel by RT-PCR (Flu A&B, Covid) Nasopharyngeal Swab     Status: None   Collection Time: 05/02/20  1:01 PM   Specimen:  Nasopharyngeal Swab; Nasopharyngeal(NP) swabs in vial transport medium  Result Value Ref Range Status   SARS Coronavirus 2 by RT PCR NEGATIVE NEGATIVE Final    Comment: (NOTE) SARS-CoV-2 target nucleic acids are NOT DETECTED.  The SARS-CoV-2 RNA is generally detectable in upper respiratory specimens during the acute phase of infection. The lowest concentration of SARS-CoV-2 viral copies this assay can detect is 138 copies/mL. A negative result does not preclude SARS-Cov-2 infection and should not be used as the sole basis for treatment or other patient management decisions. A negative result may occur with  improper specimen collection/handling, submission of specimen other than nasopharyngeal swab, presence of viral mutation(s) within the areas targeted by this assay, and inadequate number of viral copies(<138 copies/mL). A negative result must be combined with clinical observations, patient history, and epidemiological information. The expected result is Negative.  Fact Sheet for Patients:  EntrepreneurPulse.com.au  Fact Sheet for Healthcare Providers:  IncredibleEmployment.be  This test is no t yet approved or cleared by the [place] FDA and  has been authorized for detection and/or diagnosis of SARS-CoV-2 by FDA under an Emergency Use Authorization (EUA). This EUA will remain  in effect (meaning this test can be used) for the duration of the COVID-19 declaration under  Section 564(b)(1) of the Act, 21 U.S.C.section 360bbb-3(b)(1), unless the authorization is terminated  or revoked sooner.       Influenza A by PCR NEGATIVE NEGATIVE Final   Influenza B by PCR NEGATIVE NEGATIVE Final    Comment: (NOTE) The Xpert Xpress SARS-CoV-2/FLU/RSV plus assay is intended as an aid in the diagnosis of influenza from Nasopharyngeal swab specimens and should not be used as a sole basis for treatment. Nasal washings and aspirates are unacceptable for  Xpert Xpress SARS-CoV-2/FLU/RSV testing.  Fact Sheet for Patients: EntrepreneurPulse.com.au  Fact Sheet for Healthcare Providers: IncredibleEmployment.be  This test is not yet approved or cleared by the [place] FDA and has been authorized for detection and/or diagnosis of SARS-CoV-2 by FDA under an Emergency Use Authorization (EUA). This EUA will remain in effect (meaning this test can be used) for the duration of the COVID-19 declaration under Section 564(b)(1) of the Act, 21 U.S.C. section 360bbb-3(b)(1), unless the authorization is terminated or revoked.  Performed at [facility], [place] [address], [place], [place] [address]     RN Pressure Injury Documentation:     Estimated body mass index is 45.94 kg/m as calculated from the following:   Height as of this encounter: 5' 2.99" (1.6 m).   Weight as of this encounter: 117.6 kg.  Malnutrition Type:   Malnutrition Characteristics:   Nutrition Interventions:   Radiology Studies: CT Code Stroke CTA Head W/WO contrast  Result Date: 05/02/2020 CLINICAL DATA:  70 year old female code stroke presentation. Altered mental status. Recent right femur ORIF. EXAM: CT ANGIOGRAPHY HEAD AND NECK TECHNIQUE: Multidetector CT imaging of the head and neck was performed using the standard protocol during bolus administration of intravenous contrast. Multiplanar CT image reconstructions and MIPs were obtained to evaluate the vascular anatomy. Carotid stenosis measurements (when applicable) are obtained utilizing NASCET criteria, using the distal internal carotid diameter as the denominator. CONTRAST:  162m OMNIPAQUE IOHEXOL 350 MG/ML SOLN COMPARISON:  Plain head CT 1123 hours today. CTA Chest today reported separately. FINDINGS: CTA NECK Skeleton: Bulky disc and endplate degeneration throughout the cervical spine. No acute osseous abnormality identified. Upper chest: Patchy angiographic bilateral upper  lung opacity. See comparison. Other neck: Negative thyroid for age. Small volume retained secretions in the pharynx. Paranasal sinuses as described on the head CT today. No other acute finding. Aortic arch: Mild to moderate distal arch atherosclerosis. Three vessel arch configuration. Right carotid system: Mildly tortuous brachiocephalic artery and proximal right CCA without stenosis. Mild calcified plaque at the posterior right ICA origin without stenosis. Tortuous right ICA. Left carotid system: Tortuous proximal left CCA without stenosis. Mild soft plaque suspected at the level of the thyroid, no stenosis. Similar mild soft plaque at the left carotid bifurcation, left ICA origin. No stenosis. Mildly tortuous left ICA. Vertebral arteries: Tortuous proximal right subclavian artery with mild calcified plaque, no stenosis. Right vertebral artery origin is within normal limits, detail of the right V1 segment is suboptimal. But the right vertebral is patent elsewhere in the neck and to the skull base without stenosis. Mildly tortuous proximal left subclavian artery with mild calcified plaque and no stenosis. Normal left vertebral artery origin. Mildly tortuous left V1 segment. Similar suboptimal vertebral artery detail as on the right, but the left vertebral remains patent to the skull base with no convincing stenosis. CTA HEAD Posterior circulation: Patent vertebral V4 segments without significant stenosis, the right is dominant. Left PICA origin is patent. Right AICA appears dominant and patent. Patent basilar  artery with mild irregularity but no stenosis. Patent SCA origins. Fetal type bilateral PCA origins. Bilateral PCA branches are within normal limits. Anterior circulation: Both ICA siphons are patent. Mild for age bilateral siphon calcified plaque. No siphon stenosis. Normal posterior communicating artery origins. Patent carotid termini. Patent MCA and ACA origins. Anterior communicating artery and bilateral  ACA branches are within normal limits. Left MCA M1 segment and bifurcation are patent without stenosis. Right MCA M1 segment and bifurcation are patent without stenosis. Bilateral MCA branches are within normal limits. Venous sinuses: Patent. Anatomic variants: Fetal type bilateral PCA origins. Subsequent diminutive vertebrobasilar system. Review of the MIP images confirms the above findings IMPRESSION: 1. Negative for large vessel occlusion. 2. 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 No hemodynamically significant arterial stenosis identified. 3. Patchy bilateral upper lung opacity suggestive of acute viral/atypical respiratory infection. See CTA chest reported separately. These results were communicated to Dr. Rory Percy at 12:01 pm on 05/02/2020 by text page via the [facility] messaging system. Electronically Signed   By: Genevie Ann M.D.   On: 05/02/2020 12:02   CT Code Stroke CTA Neck W/WO contrast  Result Date: 05/02/2020 CLINICAL DATA:  70 year old female code stroke presentation. Altered mental status. Recent right femur ORIF. EXAM: CT ANGIOGRAPHY HEAD AND NECK TECHNIQUE: Multidetector CT imaging of the head and neck was performed using the standard protocol during bolus administration of intravenous contrast. Multiplanar CT image reconstructions and MIPs were obtained to evaluate the vascular anatomy. Carotid stenosis measurements (when applicable) are obtained utilizing NASCET criteria, using the distal internal carotid diameter as the denominator. CONTRAST:  162m OMNIPAQUE IOHEXOL 350 MG/ML SOLN COMPARISON:  Plain head CT 1123 hours today. CTA Chest today reported separately. FINDINGS: CTA NECK Skeleton: Bulky disc and endplate degeneration throughout the cervical spine. No acute osseous abnormality identified. Upper chest: Patchy angiographic bilateral upper lung opacity. See comparison. Other neck: Negative thyroid for age. Small volume retained secretions in the pharynx. Paranasal sinuses as described  on the head CT today. No other acute finding. Aortic arch: Mild to moderate distal arch atherosclerosis. Three vessel arch configuration. Right carotid system: Mildly tortuous brachiocephalic artery and proximal right CCA without stenosis. Mild calcified plaque at the posterior right ICA origin without stenosis. Tortuous right ICA. Left carotid system: Tortuous proximal left CCA without stenosis. Mild soft plaque suspected at the level of the thyroid, no stenosis. Similar mild soft plaque at the left carotid bifurcation, left ICA origin. No stenosis. Mildly tortuous left ICA. Vertebral arteries: Tortuous proximal right subclavian artery with mild calcified plaque, no stenosis. Right vertebral artery origin is within normal limits, detail of the right V1 segment is suboptimal. But the right vertebral is patent elsewhere in the neck and to the skull base without stenosis. Mildly tortuous proximal left subclavian artery with mild calcified plaque and no stenosis. Normal left vertebral artery origin. Mildly tortuous left V1 segment. Similar suboptimal vertebral artery detail as on the right, but the left vertebral remains patent to the skull base with no convincing stenosis. CTA HEAD Posterior circulation: Patent vertebral V4 segments without significant stenosis, the right is dominant. Left PICA origin is patent. Right AICA appears dominant and patent. Patent basilar artery with mild irregularity but no stenosis. Patent SCA origins. Fetal type bilateral PCA origins. Bilateral PCA branches are within normal limits. Anterior circulation: Both ICA siphons are patent. Mild for age bilateral siphon calcified plaque. No siphon stenosis. Normal posterior communicating artery origins. Patent carotid termini. Patent MCA and ACA  origins. Anterior communicating artery and bilateral ACA branches are within normal limits. Left MCA M1 segment and bifurcation are patent without stenosis. Right MCA M1 segment and bifurcation are  patent without stenosis. Bilateral MCA branches are within normal limits. Venous sinuses: Patent. Anatomic variants: Fetal type bilateral PCA origins. Subsequent diminutive vertebrobasilar system. Review of the MIP images confirms the above findings IMPRESSION: 1. Negative for large vessel occlusion. 2. 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 No hemodynamically significant arterial stenosis identified. 3. Patchy bilateral upper lung opacity suggestive of acute viral/atypical respiratory infection. See CTA chest reported separately. These results were communicated to Dr. Rory Percy at 12:01 pm on 05/02/2020 by text page via the [facility] messaging system. Electronically Signed   By: Genevie Ann M.D.   On: 05/02/2020 12:02   CT ANGIO CHEST PE W OR WO CONTRAST  Result Date: 05/02/2020 CLINICAL DATA:  High probability of pulmonary embolus. EXAM: CT ANGIOGRAPHY CHEST WITH CONTRAST TECHNIQUE: Multidetector CT imaging of the chest was performed using the standard protocol during bolus administration of intravenous contrast. Multiplanar CT image reconstructions and MIPs were obtained to evaluate the vascular anatomy. CONTRAST:  170m OMNIPAQUE IOHEXOL 350 MG/ML SOLN COMPARISON:  August 15, 2019. FINDINGS: Cardiovascular: There is no evidence of large central pulmonary embolus seen in the main pulmonary artery or main portions of the left and right pulmonary arteries. Evaluation of the lower lobe branches of both pulmonary arteries is limited due to respiratory motion artifact and attenuation due to body habitus. Smaller and more peripheral pulmonary emboli cannot be excluded on the basis of this exam. Normal cardiac size. No pericardial effusion. No evidence of thoracic aortic dissection or aneurysm. Mediastinum/Nodes: No enlarged mediastinal, hilar, or axillary lymph nodes. Thyroid gland, trachea, and esophagus demonstrate no significant findings. Lungs/Pleura: No pneumothorax or pleural effusion is noted. Patchy airspace  opacities are noted throughout both lungs which may represent multifocal pneumonia. Upper Abdomen: No acute abnormality. Musculoskeletal: No chest wall abnormality. No acute or significant osseous findings. Review of the MIP images confirms the above findings. IMPRESSION: 1. There is no evidence of large central pulmonary embolus seen in the main pulmonary artery or main portions of the left and right pulmonary arteries. Evaluation of the lower lobe branches of both pulmonary arteries is limited due to respiratory motion artifact and attenuation due to body habitus. Smaller and more peripheral pulmonary emboli cannot be excluded on the basis of this exam. 2. Patchy airspace opacities are noted throughout both lungs which may represent multifocal pneumonia. Electronically Signed   By: JMarijo ConceptionM.D.   On: 05/02/2020 12:09   DG FEMUR PORT, MIN 2 VIEWS RIGHT  Result Date: 04/30/2020 CLINICAL DATA:  Right hip ORIF, postoperative assessment EXAM: RIGHT FEMUR PORTABLE 2 VIEW COMPARISON:  02/28/2021 FINDINGS: Right intertrochanteric fracture ORIF noted with intramedullary nail component with distal interlocking screws, a long threaded axial femoral neck screw component, and a distal threaded femoral neck screw component. The patient has a known greater trochanteric fragment which is better aligned than on the preoperative radiographs. No new fracture or new complicating feature. Mild spurring of the right acetabulum noted. 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 IMPRESSION: Right intertrochanteric fracture ORIF without complicating feature identified. Electronically Signed   By: WVan ClinesM.D.   On: 04/30/2020 16:47   CT HEAD CODE STROKE WO CONTRAST  Result Date: 05/02/2020 CLINICAL DATA:  Code stroke. 70year old female altered mental status. EXAM: CT HEAD WITHOUT CONTRAST TECHNIQUE: Contiguous axial images were  obtained from the base of the skull through the vertex without intravenous contrast.  COMPARISON:  Head CT 11/09/2019. FINDINGS: Brain: Stable dystrophic calcifications bilateral basal ganglia. No midline shift, ventriculomegaly, mass effect, evidence of mass lesion, intracranial hemorrhage or evidence of cortically based acute infarction. Gray-white matter differentiation is within normal limits throughout the brain. Vascular: Calcified atherosclerosis at the skull base. No suspicious intracranial vascular hyperdensity. Skull: No acute osseous abnormality identified. Sinuses/Orbits: Generalized mild to moderate paranasal sinus mucosal thickening is new. No sinus fluid levels. Tympanic cavities and mastoids remain clear. Other: Visualized orbits and scalp soft tissues are within normal limits. ASPECTS [facility] Stroke Program Early CT Score) Total score (0-10 with 10 being normal): 10 IMPRESSION: 1. Stable and normal for age noncontrast CT appearance of the brain. ASPECTS 10. 2. New generalized paranasal sinus inflammation. 3. These results were communicated to Dr. Rory Percy at 11:35 am on 05/02/2020 by text page via the [facility] messaging system. Electronically Signed   By: Genevie Ann M.D.   On: 05/02/2020 11:36   Scheduled Meds: . sodium chloride   Intravenous Once  . sodium chloride   Intravenous Once  . aspirin EC  81 mg Oral Daily  . diphenhydrAMINE      . diphenhydrAMINE  25 mg Intravenous Once  . docusate sodium  100 mg Oral BID  . famotidine  40 mg Oral Daily  . insulin aspart  0-9 Units Subcutaneous TID WC  . metoprolol succinate  75 mg Oral Daily  . metoprolol tartrate  5 mg Intravenous Q8H  . montelukast  10 mg Oral QHS  . oxybutynin  5 mg Oral TID  . pantoprazole  40 mg Oral Daily  . potassium & sodium phosphates  1 packet Oral TID WC & HS  . pravastatin  40 mg Oral QHS  . pregabalin  100 mg Oral QHS  . Ensure Max Protein  11 oz Oral TID  . senna-docusate  1 tablet Oral Q0600   Continuous Infusions: . 0.9 % NaCl with KCl 20 mEq / L 75 mL/hr at 05/02/20 1430  . ceFEPime  (MAXIPIME) IV 2 g (05/02/20 1430)  . methocarbamol (ROBAXIN) IV    . vancomycin    . [START ON 05/03/2020] vancomycin      LOS: 3 days   Kerney Elbe, DO [facility] PAGER is on [place]  If 7PM-7AM, please contact night-coverage [email or web address]

## 2020-05-02 NOTE — Progress Notes (Signed)
   05/02/20 1622  Vitals  Temp 100.2 F (37.9 C)  Temp Source Axillary  BP (!) 118/54  MAP (mmHg) 73  Pulse Rate (!) 130  ECG Heart Rate (!) 131  Resp (!) 35  Level of Consciousness  Level of Consciousness Responds to Voice  Oxygen Therapy  SpO2 93 %  O2 Device Nasal Cannula  O2 Flow Rate (L/min) 3 L/min  Patient Activity (if Appropriate) In bed

## 2020-05-02 NOTE — Progress Notes (Signed)
Subjective: 2 Days Post-Op s/p Procedure(s): INTRAMEDULLARY (IM) NAIL INTERTROCHANTRIC  Drowsy. Patient responsive to commands but unable to answer questions.   Objective:  PE: VITALS:   Vitals:   05/02/20 1131 05/02/20 1142 05/02/20 1154 05/02/20 1158  BP: 116/68 107/66  (!) 133/51  Pulse:    (!) 147  Resp: (!) 27 (!) 31  (!) 21  Temp:    (!) 101.4 F (38.6 C)  TempSrc:    Axillary  SpO2:   98% 98%  Weight:      Height:       General: sitting up in bed, in no acute distress. Following commands but unable to answer questions.  Resp: non-labored breathing, respiratory effort normal MSK: RLE - dressing CDI. Skin examined showing no ecchymosis or signs of bleeding. Surgical incision well approximated with no drainage. Able to dorsiflex and plantarflex at ankle. Able to move all toes. + DP pulse. Patient does respond that yes, she can feel touch when asked regarding sensation to toes. Calves compressible bilaterally.   LABS  Results for orders placed or performed during the hospital encounter of 04/29/20 (from the past 24 hour(s))  Glucose, capillary     Status: Abnormal   Collection Time: 05/01/20  5:09 PM  Result Value Ref Range   Glucose-Capillary 169 (H) 70 - 99 mg/dL  Glucose, capillary     Status: Abnormal   Collection Time: 05/01/20  9:04 PM  Result Value Ref Range   Glucose-Capillary 169 (H) 70 - 99 mg/dL  CBC with Differential/Platelet     Status: Abnormal   Collection Time: 05/02/20  2:24 AM  Result Value Ref Range   WBC 13.9 (H) 4.0 - 10.5 K/uL   RBC 2.22 (L) 3.87 - 5.11 MIL/uL   Hemoglobin 7.1 (L) 12.0 - 15.0 g/dL   HCT 21.0 (L) 36.0 - 46.0 %   MCV 94.6 80.0 - 100.0 fL   MCH 32.0 26.0 - 34.0 pg   MCHC 33.8 30.0 - 36.0 g/dL   RDW 18.8 (H) 11.5 - 15.5 %   Platelets 65 (L) 150 - 400 K/uL   nRBC 5.8 (H) 0.0 - 0.2 %   Neutrophils Relative % 47 %   Neutro Abs 6.4 1.7 - 7.7 K/uL   Lymphocytes Relative 14 %   Lymphs Abs 2.0 0.7 - 4.0 K/uL   Monocytes  Relative 31 %   Monocytes Absolute 4.3 (H) 0.1 - 1.0 K/uL   Eosinophils Relative 0 %   Eosinophils Absolute 0.0 0.0 - 0.5 K/uL   Basophils Relative 1 %   Basophils Absolute 0.1 0.0 - 0.1 K/uL   WBC Morphology MILD LEFT SHIFT (1-5% METAS, OCC MYELO, OCC BANDS)    Immature Granulocytes 7 %   Abs Immature Granulocytes 1.02 (H) 0.00 - 0.07 K/uL   Polychromasia PRESENT    Basophilic Stippling PRESENT   Comprehensive metabolic panel     Status: Abnormal   Collection Time: 05/02/20  2:24 AM  Result Value Ref Range   Sodium 137 135 - 145 mmol/L   Potassium 4.6 3.5 - 5.1 mmol/L   Chloride 106 98 - 111 mmol/L   CO2 24 22 - 32 mmol/L   Glucose, Bld 194 (H) 70 - 99 mg/dL   BUN 14 8 - 23 mg/dL   Creatinine, Ser 0.71 0.44 - 1.00 mg/dL   Calcium 8.6 (L) 8.9 - 10.3 mg/dL   Total Protein 5.3 (L) 6.5 - 8.1 g/dL   Albumin 2.6 (L) 3.5 -  5.0 g/dL   AST 34 15 - 41 U/L   ALT 8 0 - 44 U/L   Alkaline Phosphatase 43 38 - 126 U/L   Total Bilirubin 1.2 0.3 - 1.2 mg/dL   GFR, Estimated >60 >60 mL/min   Anion gap 7 5 - 15  Phosphorus     Status: Abnormal   Collection Time: 05/02/20  2:24 AM  Result Value Ref Range   Phosphorus 2.1 (L) 2.5 - 4.6 mg/dL  Magnesium     Status: None   Collection Time: 05/02/20  2:24 AM  Result Value Ref Range   Magnesium 2.1 1.7 - 2.4 mg/dL  Glucose, capillary     Status: Abnormal   Collection Time: 05/02/20  7:48 AM  Result Value Ref Range   Glucose-Capillary 168 (H) 70 - 99 mg/dL  Prepare RBC (crossmatch)     Status: None   Collection Time: 05/02/20 10:41 AM  Result Value Ref Range   Order Confirmation      ORDER PROCESSED BY BLOOD BANK Performed at [facility], [place] [address] [place], [place] [address]   Blood gas, arterial     Status: Abnormal   Collection Time: 05/02/20 10:58 AM  Result Value Ref Range   FIO2 21.00    pH, Arterial 7.450 7.350 - 7.450   pCO2 arterial 33.7 32.0 - 48.0 mmHg   pO2, Arterial 44.4 (L) 83.0 - 108.0 mmHg   Bicarbonate 23.0  20.0 - 28.0 mmol/L   Acid-base deficit 0.5 0.0 - 2.0 mmol/L   O2 Saturation 80.5 %   Patient temperature 37.0    Collection site LEFT RADIAL    Drawn by [id number]    Sample type ARTERIAL DRAW    Allens test (pass/fail) PASS PASS  Glucose, capillary     Status: Abnormal   Collection Time: 05/02/20 12:56 PM  Result Value Ref Range   Glucose-Capillary 150 (H) 70 - 99 mg/dL  Resp Panel by RT-PCR (Flu A&B, Covid) Nasopharyngeal Swab     Status: None   Collection Time: 05/02/20  1:01 PM   Specimen: Nasopharyngeal Swab; Nasopharyngeal(NP) swabs in vial transport medium  Result Value Ref Range   SARS Coronavirus 2 by RT PCR NEGATIVE NEGATIVE   Influenza A by PCR NEGATIVE NEGATIVE   Influenza B by PCR NEGATIVE NEGATIVE    CT Code Stroke CTA Head W/WO contrast  Result Date: 05/02/2020 CLINICAL DATA:  69 year old female code stroke presentation. Altered mental status. Recent right femur ORIF. EXAM: CT ANGIOGRAPHY HEAD AND NECK TECHNIQUE: Multidetector CT imaging of the head and neck was performed using the standard protocol during bolus administration of intravenous contrast. Multiplanar CT image reconstructions and MIPs were obtained to evaluate the vascular anatomy. Carotid stenosis measurements (when applicable) are obtained utilizing NASCET criteria, using the distal internal carotid diameter as the denominator. CONTRAST:  135m OMNIPAQUE IOHEXOL 350 MG/ML SOLN COMPARISON:  Plain head CT 1123 hours today. CTA Chest today reported separately. FINDINGS: CTA NECK Skeleton: Bulky disc and endplate degeneration throughout the cervical spine. No acute osseous abnormality identified. Upper chest: Patchy angiographic bilateral upper lung opacity. See comparison. Other neck: Negative thyroid for age. Small volume retained secretions in the pharynx. Paranasal sinuses as described on the head CT today. No other acute finding. Aortic arch: Mild to moderate distal arch atherosclerosis. Three vessel arch  configuration. Right carotid system: Mildly tortuous brachiocephalic artery and proximal right CCA without stenosis. Mild calcified plaque at the posterior right ICA origin without stenosis. Tortuous right  ICA. Left carotid system: Tortuous proximal left CCA without stenosis. Mild soft plaque suspected at the level of the thyroid, no stenosis. Similar mild soft plaque at the left carotid bifurcation, left ICA origin. No stenosis. Mildly tortuous left ICA. Vertebral arteries: Tortuous proximal right subclavian artery with mild calcified plaque, no stenosis. Right vertebral artery origin is within normal limits, detail of the right V1 segment is suboptimal. But the right vertebral is patent elsewhere in the neck and to the skull base without stenosis. Mildly tortuous proximal left subclavian artery with mild calcified plaque and no stenosis. Normal left vertebral artery origin. Mildly tortuous left V1 segment. Similar suboptimal vertebral artery detail as on the right, but the left vertebral remains patent to the skull base with no convincing stenosis. CTA HEAD Posterior circulation: Patent vertebral V4 segments without significant stenosis, the right is dominant. Left PICA origin is patent. Right AICA appears dominant and patent. Patent basilar artery with mild irregularity but no stenosis. Patent SCA origins. Fetal type bilateral PCA origins. Bilateral PCA branches are within normal limits. Anterior circulation: Both ICA siphons are patent. Mild for age bilateral siphon calcified plaque. No siphon stenosis. Normal posterior communicating artery origins. Patent carotid termini. Patent MCA and ACA origins. Anterior communicating artery and bilateral ACA branches are within normal limits. Left MCA M1 segment and bifurcation are patent without stenosis. Right MCA M1 segment and bifurcation are patent without stenosis. Bilateral MCA branches are within normal limits. Venous sinuses: Patent. Anatomic variants: Fetal type  bilateral PCA origins. Subsequent diminutive vertebrobasilar system. Review of the MIP images confirms the above findings IMPRESSION: 1. Negative for large vessel occlusion. 2. 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 No hemodynamically significant arterial stenosis identified. 3. Patchy bilateral upper lung opacity suggestive of acute viral/atypical respiratory infection. See CTA chest reported separately. These results were communicated to Dr. Rory Percy at 12:01 pm on 05/02/2020 by text page via the [facility] messaging system. Electronically Signed   By: Genevie Ann M.D.   On: 05/02/2020 12:02   CT Code Stroke CTA Neck W/WO contrast  Result Date: 05/02/2020 CLINICAL DATA:  70 year old female code stroke presentation. Altered mental status. Recent right femur ORIF. EXAM: CT ANGIOGRAPHY HEAD AND NECK TECHNIQUE: Multidetector CT imaging of the head and neck was performed using the standard protocol during bolus administration of intravenous contrast. Multiplanar CT image reconstructions and MIPs were obtained to evaluate the vascular anatomy. Carotid stenosis measurements (when applicable) are obtained utilizing NASCET criteria, using the distal internal carotid diameter as the denominator. CONTRAST:  164m OMNIPAQUE IOHEXOL 350 MG/ML SOLN COMPARISON:  Plain head CT 1123 hours today. CTA Chest today reported separately. FINDINGS: CTA NECK Skeleton: Bulky disc and endplate degeneration throughout the cervical spine. No acute osseous abnormality identified. Upper chest: Patchy angiographic bilateral upper lung opacity. See comparison. Other neck: Negative thyroid for age. Small volume retained secretions in the pharynx. Paranasal sinuses as described on the head CT today. No other acute finding. Aortic arch: Mild to moderate distal arch atherosclerosis. Three vessel arch configuration. Right carotid system: Mildly tortuous brachiocephalic artery and proximal right CCA without stenosis. Mild calcified plaque at the  posterior right ICA origin without stenosis. Tortuous right ICA. Left carotid system: Tortuous proximal left CCA without stenosis. Mild soft plaque suspected at the level of the thyroid, no stenosis. Similar mild soft plaque at the left carotid bifurcation, left ICA origin. No stenosis. Mildly tortuous left ICA. Vertebral arteries: Tortuous proximal right subclavian artery with mild calcified plaque,  no stenosis. Right vertebral artery origin is within normal limits, detail of the right V1 segment is suboptimal. But the right vertebral is patent elsewhere in the neck and to the skull base without stenosis. Mildly tortuous proximal left subclavian artery with mild calcified plaque and no stenosis. Normal left vertebral artery origin. Mildly tortuous left V1 segment. Similar suboptimal vertebral artery detail as on the right, but the left vertebral remains patent to the skull base with no convincing stenosis. CTA HEAD Posterior circulation: Patent vertebral V4 segments without significant stenosis, the right is dominant. Left PICA origin is patent. Right AICA appears dominant and patent. Patent basilar artery with mild irregularity but no stenosis. Patent SCA origins. Fetal type bilateral PCA origins. Bilateral PCA branches are within normal limits. Anterior circulation: Both ICA siphons are patent. Mild for age bilateral siphon calcified plaque. No siphon stenosis. Normal posterior communicating artery origins. Patent carotid termini. Patent MCA and ACA origins. Anterior communicating artery and bilateral ACA branches are within normal limits. Left MCA M1 segment and bifurcation are patent without stenosis. Right MCA M1 segment and bifurcation are patent without stenosis. Bilateral MCA branches are within normal limits. Venous sinuses: Patent. Anatomic variants: Fetal type bilateral PCA origins. Subsequent diminutive vertebrobasilar system. Review of the MIP images confirms the above findings IMPRESSION: 1. Negative  for large vessel occlusion. 2. 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 No hemodynamically significant arterial stenosis identified. 3. Patchy bilateral upper lung opacity suggestive of acute viral/atypical respiratory infection. See CTA chest reported separately. These results were communicated to Dr. Rory Percy at 12:01 pm on 05/02/2020 by text page via the [facility] messaging system. Electronically Signed   By: Genevie Ann M.D.   On: 05/02/2020 12:02   CT ANGIO CHEST PE W OR WO CONTRAST  Result Date: 05/02/2020 CLINICAL DATA:  High probability of pulmonary embolus. EXAM: CT ANGIOGRAPHY CHEST WITH CONTRAST TECHNIQUE: Multidetector CT imaging of the chest was performed using the standard protocol during bolus administration of intravenous contrast. Multiplanar CT image reconstructions and MIPs were obtained to evaluate the vascular anatomy. CONTRAST:  143m OMNIPAQUE IOHEXOL 350 MG/ML SOLN COMPARISON:  August 15, 2019. FINDINGS: Cardiovascular: There is no evidence of large central pulmonary embolus seen in the main pulmonary artery or main portions of the left and right pulmonary arteries. Evaluation of the lower lobe branches of both pulmonary arteries is limited due to respiratory motion artifact and attenuation due to body habitus. Smaller and more peripheral pulmonary emboli cannot be excluded on the basis of this exam. Normal cardiac size. No pericardial effusion. No evidence of thoracic aortic dissection or aneurysm. Mediastinum/Nodes: No enlarged mediastinal, hilar, or axillary lymph nodes. Thyroid gland, trachea, and esophagus demonstrate no significant findings. Lungs/Pleura: No pneumothorax or pleural effusion is noted. Patchy airspace opacities are noted throughout both lungs which may represent multifocal pneumonia. Upper Abdomen: No acute abnormality. Musculoskeletal: No chest wall abnormality. No acute or significant osseous findings. Review of the MIP images confirms the above findings. IMPRESSION:  1. There is no evidence of large central pulmonary embolus seen in the main pulmonary artery or main portions of the left and right pulmonary arteries. Evaluation of the lower lobe branches of both pulmonary arteries is limited due to respiratory motion artifact and attenuation due to body habitus. Smaller and more peripheral pulmonary emboli cannot be excluded on the basis of this exam. 2. Patchy airspace opacities are noted throughout both lungs which may represent multifocal pneumonia. Electronically Signed   By: JJeneen Rinks  Murlean Caller M.D.   On: 05/02/2020 12:09   DG C-Arm 1-60 Min  Result Date: 04/30/2020 CLINICAL DATA:  Trauma.  Intramedullary nail right femur. EXAM: RIGHT FEMUR 2 VIEWS; DG C-ARM 1-60 MIN COMPARISON:  Radiographs April 29, 2020. FINDINGS: Fluoro time: 6 minutes and 38 seconds. Reported radiation: 166.89 mGy. Eleven C-arm fluoroscopic images were obtained intraoperatively and submitted for post operative interpretation. These images demonstrate sequential changes associated with right intramedullary nail and screw fixation of a intertrochanteric femur fracture. Final images demonstrate improved, near anatomic alignment without unexpected findings. Please see the performing provider's procedural report for further detail. IMPRESSION: Intraoperative fluoroscopic imaging, as detailed above. Electronically Signed   By: Margaretha Sheffield MD   On: 04/30/2020 15:16   DG FEMUR, MIN 2 VIEWS RIGHT  Result Date: 04/30/2020 CLINICAL DATA:  Trauma.  Intramedullary nail right femur. EXAM: RIGHT FEMUR 2 VIEWS; DG C-ARM 1-60 MIN COMPARISON:  Radiographs April 29, 2020. FINDINGS: Fluoro time: 6 minutes and 38 seconds. Reported radiation: 166.89 mGy. Eleven C-arm fluoroscopic images were obtained intraoperatively and submitted for post operative interpretation. These images demonstrate sequential changes associated with right intramedullary nail and screw fixation of a intertrochanteric femur fracture.  Final images demonstrate improved, near anatomic alignment without unexpected findings. Please see the performing provider's procedural report for further detail. IMPRESSION: Intraoperative fluoroscopic imaging, as detailed above. Electronically Signed   By: Margaretha Sheffield MD   On: 04/30/2020 15:16   DG FEMUR PORT, MIN 2 VIEWS RIGHT  Result Date: 04/30/2020 CLINICAL DATA:  Right hip ORIF, postoperative assessment EXAM: RIGHT FEMUR PORTABLE 2 VIEW COMPARISON:  02/28/2021 FINDINGS: Right intertrochanteric fracture ORIF noted with intramedullary nail component with distal interlocking screws, a long threaded axial femoral neck screw component, and a distal threaded femoral neck screw component. The patient has a known greater trochanteric fragment which is better aligned than on the preoperative radiographs. No new fracture or new complicating feature. Mild spurring of the right acetabulum noted. 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 IMPRESSION: Right intertrochanteric fracture ORIF without complicating feature identified. Electronically Signed   By: Van Clines M.D.   On: 04/30/2020 16:47   CT HEAD CODE STROKE WO CONTRAST  Result Date: 05/02/2020 CLINICAL DATA:  Code stroke. 70 year old female altered mental status. EXAM: CT HEAD WITHOUT CONTRAST TECHNIQUE: Contiguous axial images were obtained from the base of the skull through the vertex without intravenous contrast. COMPARISON:  Head CT 11/09/2019. FINDINGS: Brain: Stable dystrophic calcifications bilateral basal ganglia. No midline shift, ventriculomegaly, mass effect, evidence of mass lesion, intracranial hemorrhage or evidence of cortically based acute infarction. Gray-white matter differentiation is within normal limits throughout the brain. Vascular: Calcified atherosclerosis at the skull base. No suspicious intracranial vascular hyperdensity. Skull: No acute osseous abnormality identified. Sinuses/Orbits: Generalized mild to moderate  paranasal sinus mucosal thickening is new. No sinus fluid levels. Tympanic cavities and mastoids remain clear. Other: Visualized orbits and scalp soft tissues are within normal limits. ASPECTS [facility] Stroke Program Early CT Score) Total score (0-10 with 10 being normal): 10 IMPRESSION: 1. Stable and normal for age noncontrast CT appearance of the brain. ASPECTS 10. 2. New generalized paranasal sinus inflammation. 3. These results were communicated to Dr. Rory Percy at 11:35 am on 05/02/2020 by text page via the [facility] messaging system. Electronically Signed   By: Genevie Ann M.D.   On: 05/02/2020 11:36    Assessment/Plan: Closed subtrochanteric femur fracture 2 Days Post-Op s/p Procedure(s): INTRAMEDULLARY (IM) NAIL INTERTROCHANTRIC  Acute Blood Loss Anemia: Hbg 7.1  this am, 3 unit platelets and 2 units PRBC given so far, another 1 unit PRBC was ordered this am Weightbearing: WBAT RLE Insicional and dressing care: Reinforce dressings as needed, dressing changed today VTE prophylaxis: 81 mg aspirin due to patient's thrombocytopenia Pain control:  Tylenol 325-650 q 6 hours mild pain Hydrocodone 5/333m 1-2 tablets q 6 hours prn moderate pain Oxycodone 5 mg q 6 hours prn severe pain Morphine 0.5-157mIV q 3 hours prn severe pain Lyrica 10047maily at bedtime  AMS: -CTA head/neck showing no emergent occulsion - CTA chest showing multifocal pneumonia, no PE - per primary team/neurology, awaiting results of MRI Brain  PT recommending SNF, TOC consult placed    BlaVentura Bruns16/2022, 2:59 PM

## 2020-05-02 NOTE — Progress Notes (Signed)
Pharmacy Antibiotic Note  Kathy Irwin is a 70 y.o. female admitted on 04/29/2020 withright hip pain now s/p right hip fx repair, now with suspected HCAP. PMH includes HTN, T2DM, ILD, chronic respiratory failure,ITP, CMML-1,and obesity . Pharmacy has been consulted for cefepime and vancomycin dosing.  Scr 0.71 at baseline. WBC increased to 13.9 today, T 101.4. Altered mental status today, code stroke initiated and later cancelled due to no acute stroke found on imaging. CTA chest suggestive of multifocal pneumonia.  Plan: Vancomycin 2500 mg x1 then 1750 mg q24h, predicted AUC 534, goal 400-550, Scr 0.71  Cefepime 2g q8h Monitor cultures, renal function, clinical progression, de-escalation as appropriate  Height: 5' 2.99" (160 cm) Weight: 117.6 kg (259 lb 4.8 oz) IBW/kg (Calculated) : 52.38  Temp (24hrs), Avg:99.3 F (37.4 C), Min:97.6 F (36.4 C), Max:101.4 F (38.6 C)  Recent Labs  Lab 04/29/20 1205 04/29/20 2133 04/30/20 0334 04/30/20 1142 05/01/20 0301 05/02/20 0224  WBC 7.0 8.1 8.7 10.7* 10.3 13.9*  CREATININE 0.68  --  0.97  --  1.38* 0.71    Estimated Creatinine Clearance: 82.2 mL/min (by C-G formula based on SCr of 0.71 mg/dL).    Allergies  Allergen Reactions  . Other Shortness Of Breath and Other (See Comments)    Newspaper ink =  new chest pain, also  . Iodine Other (See Comments)    Unknown reaction  . Merbromin Other (See Comments)    Unknown reaction - Mercurochrome- "Was a long time ago"   . Tape Rash    Antimicrobials this admission: Vancomycin 1/16 >>  Cefepime 1/16 >>   Dose adjustments this admission:  Microbiology results: 1/16 BCx: sent 1/16 MRSA PCR: sent 1/16 Respratory PCR: sent 1/13 MRSA PCR: negative   Thank you for allowing pharmacy to be a part of this patient's care.  Fara Olden, PharmD PGY-1 Pharmacy Resident 05/02/2020 1:23 PM Please see AMION for all pharmacy numbers

## 2020-05-02 NOTE — Progress Notes (Signed)
   05/02/20 1109  Assess: MEWS Score  BP (!) 79/36  Pulse Rate (!) 151  ECG Heart Rate (!) 152  Resp (!) 22  Level of Consciousness Responds to Voice  SpO2 96 %  O2 Device Nasal Cannula  Patient Activity (if Appropriate) In bed  O2 Flow Rate (L/min) 3 L/min  Assess: MEWS Score  MEWS Temp 0  MEWS Systolic 2  MEWS Pulse 3  MEWS RR 1  MEWS LOC 1  MEWS Score 7  MEWS Score Color Red  Assess: if the MEWS score is Yellow or Red  Were vital signs taken at a resting state? Yes  Focused Assessment No change from prior assessment  Early Detection of Sepsis Score *See Row Information* High  MEWS guidelines implemented *See Row Information* No, previously red, continue vital signs every 4 hours  Notify: Charge Nurse/RN  Name of Charge Nurse/RN Notified Linda C.  Date Charge Nurse/RN Notified 05/02/20  Time Charge Nurse/RN Notified 1100  Notify: Provider  Provider Name/Title Dr. Alfredia Ferguson  Date Provider Notified 05/02/20  Time Provider Notified 1105  Notification Type Rounds  Notification Reason Other (Comment) (Chronic Red M[place]s, Lethargic)  Response See new orders  Date of Provider Response 05/02/20  Time of Provider Response 1105  Document  Patient Outcome Not stable and remains on department  Progress note created (see row info) Yes

## 2020-05-02 NOTE — Consult Note (Addendum)
NEURO HOSPITALIST  CONSULT   Requesting Physician: Dr. Alfredia Ferguson    Chief Complaint: AMS  History obtained from:  Chart/ Family HPI:                                                                                                                                         Kathy Irwin is an 70 y.o. female  With PMH HTN, ILD, chronic respiratory failure ( 2L North at night), DM 2, obesity who presented to [facility] 04/29/20 for right hip pain. Code stroke initiated 05/02/20 for AMS.  Patient was last known normal about 2300 05/01/20. This morning nursing was having trouble waking her, she was not responding to questions when she had previously been A/O x3. Code stroke was initiated for altered mental status.  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 She has never used smokeless tobacco. She reports that she does  not drink alcohol and does not use drugs.  Allergies:  Allergies  Allergen Reactions   Other Shortness Of Breath and Other (See Comments)    Newspaper ink =  new chest pain, also   Iodine Other (See Comments)    Unknown reaction   Merbromin Other (See Comments)    Unknown reaction - Mercurochrome- "Was a long time ago"    Tape Rash    Medications:  Scheduled:  sodium chloride   Intravenous Once   sodium chloride   Intravenous Once   aspirin EC  81 mg Oral Daily   diphenhydrAMINE       diphenhydrAMINE  25 mg Intravenous Once   docusate sodium  100 mg Oral BID   famotidine  40 mg Oral Daily   insulin aspart  0-9 Units Subcutaneous TID WC   metoprolol succinate  75 mg Oral Daily   metoprolol tartrate  5 mg Intravenous Q8H   montelukast  10 mg Oral QHS   oxybutynin  5 mg Oral TID   pantoprazole  40 mg Oral Daily   potassium & sodium phosphates  1 packet Oral TID WC & HS   pravastatin  40 mg Oral QHS   pregabalin  100 mg Oral QHS   Ensure Max Protein  11 oz Oral TID   senna-docusate  1 tablet Oral Q0600   Continuous:  0.9 % NaCl with KCl 20 mEq / L 75 mL/hr at 05/01/20 1332   methocarbamol (ROBAXIN) IV     sodium chloride     WIO:MBTDHRCBULAGT, dextrose, fluticasone, HYDROcodone-acetaminophen, methocarbamol **OR** methocarbamol (ROBAXIN) IV, metoCLOPramide **OR** metoCLOPramide (REGLAN) injection, morphine injection, Muscle Rub, naLOXone (NARCAN)  injection, olopatadine, ondansetron **OR** ondansetron (ZOFRAN) IV, oxyCODONE, polyethylene glycol, simethicone, sodium chloride   ROS:                                                                                                                                       History  unobtainable from patient due to mental status     General Examination:                                                                                                       Blood pressure (!) 79/36, pulse (!) 140, temperature 99.2 F (37.3 C), temperature source Oral, resp. rate (!) 22, height 5' 2.99" (1.6 m), weight 117.6 kg, SpO2 96 %.  Physical Exam  Constitutional: Appears well-developed and well-nourished.  Eyes: Normal external eye and conjunctiva. HENT: Normocephalic, no lesions, without obvious abnormality.   Musculoskeletal-no joint tenderness, deformity or swelling Cardiovascular: Normal rate and regular rhythm.  Respiratory: Effort normal, non-labored breathing saturations WNL GI: Soft.  No distension. There is no tenderness.  Skin: WDI  Neurological Examination Mental Status: Drowsy; required repeated stimulation to stay awake. No dysarthria noted.  Initially not following commands consistently, but after head CT following commands more consistently. Cranial Nerves: II:  Blink to  threat III,IV, VI: ptosis not present, extra-ocular motions intact bilaterally, pupils equal, round, reactive to light and accommodation V,VII: face appears symmetric VIII: hearing normal bilaterally IX,X: uvula rises midline XI: bilateral shoulder shrug XII: midline tongue extension Motor/Sensory: Able to raise BUE and LLE anti-gravity spontaneously and to noxious. RLE moves to noxious, but did not raise anti-gravity. This was her recent hip surgery.  Tone and bulk:normal tone throughout; no atrophy noted Cerebellar: Asterixis noted bilaterally Gait: deferred   Lab Results: Basic Metabolic Panel: Recent Labs  Lab 04/29/20 1205 04/30/20 0334 04/30/20 1431 05/01/20 0301 05/02/20 0224  NA 139 138 139 135 137  K 3.8 4.4 5.8* 4.9 4.6  CL 106 104  --  103 106  CO2 24 26  --  24 24  GLUCOSE 145* 164*  --  178* 194*  BUN 13 21  --  27* 14  CREATININE 0.68 0.97  --  1.38* 0.71  CALCIUM 9.4 9.0  --  8.6* 8.6*  MG  --   --   --  1.7 2.1  PHOS  --   --   --  4.6 2.1*    CBC: Recent Labs  Lab 04/29/20 1205  04/29/20 2133 04/30/20 0334 04/30/20 1142 04/30/20 1431 05/01/20 0301 05/02/20 0224  WBC 7.0 8.1 8.7 10.7*  --  10.3 13.9*  NEUTROABS 1.6*  --   --   --   --  3.6 6.4  HGB 10.5* 8.2* 7.5* 8.9* 7.8* 6.9* 7.1*  HCT 32.7* 25.6* 23.8* 29.0* 23.0* 21.8* 21.0*  MCV 100.6* 100.8* 102.1* 102.8*  --  96.5 94.6  PLT 30* 48* 43* 71*  --  47* 65*   CBG: Recent Labs  Lab 05/01/20 0748 05/01/20 1201 05/01/20 1709 05/01/20 2104 05/02/20 0748  GLUCAP 169* 192* 169* 169* 168*    Imaging: DG C-Arm 1-60 Min  Result Date: 04/30/2020 CLINICAL DATA:  Trauma.  Intramedullary nail right femur. EXAM: RIGHT FEMUR 2 VIEWS; DG C-ARM 1-60 MIN COMPARISON:  Radiographs April 29, 2020. FINDINGS: Fluoro time: 6 minutes and 38 seconds. Reported radiation: 166.89 mGy. Eleven C-arm fluoroscopic images were obtained intraoperatively and submitted for post operative interpretation. These images demonstrate sequential changes associated with right intramedullary nail and screw fixation of a intertrochanteric femur fracture. Final images demonstrate improved, near anatomic alignment without unexpected findings. Please see the performing provider's procedural report for further detail. IMPRESSION: Intraoperative fluoroscopic imaging, as detailed above. Electronically Signed   By: Margaretha Sheffield MD   On: 04/30/2020 15:16   DG FEMUR, MIN 2 VIEWS RIGHT  Result Date: 04/30/2020 CLINICAL DATA:  Trauma.  Intramedullary nail right femur. EXAM: RIGHT FEMUR 2 VIEWS; DG C-ARM 1-60 MIN COMPARISON:  Radiographs April 29, 2020. FINDINGS: Fluoro time: 6 minutes and 38 seconds. Reported radiation: 166.89 mGy. Eleven C-arm fluoroscopic images were obtained intraoperatively and submitted for post operative interpretation. These images demonstrate sequential changes associated with right intramedullary nail and screw fixation of a intertrochanteric femur fracture. Final images demonstrate improved, near anatomic alignment without  unexpected findings. Please see the performing provider's procedural report for further detail. IMPRESSION: Intraoperative fluoroscopic imaging, as detailed above. Electronically Signed   By: Margaretha Sheffield MD   On: 04/30/2020 15:16   DG FEMUR PORT, MIN 2 VIEWS RIGHT  Result Date: 04/30/2020 CLINICAL DATA:  Right hip ORIF, postoperative assessment EXAM: RIGHT FEMUR PORTABLE 2 VIEW COMPARISON:  02/28/2021 FINDINGS: Right intertrochanteric fracture ORIF noted with intramedullary nail component with distal interlocking screws, a long threaded axial femoral  neck screw component, and a distal threaded femoral neck screw component. The patient has a known greater trochanteric fragment which is better aligned than on the preoperative radiographs. No new fracture or new complicating feature. Mild spurring of the right acetabulum noted. 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 IMPRESSION: Right intertrochanteric fracture ORIF without complicating feature identified. Electronically Signed   By: Van Clines M.D.   On: 04/30/2020 16:47   CT HEAD CODE STROKE WO CONTRAST  Result Date: 05/02/2020 CLINICAL DATA:  Code stroke. 70 year old female altered mental status. EXAM: CT HEAD WITHOUT CONTRAST TECHNIQUE: Contiguous axial images were obtained from the base of the skull through the vertex without intravenous contrast. COMPARISON:  Head CT 11/09/2019. FINDINGS: Brain: Stable dystrophic calcifications bilateral basal ganglia. No midline shift, ventriculomegaly, mass effect, evidence of mass lesion, intracranial hemorrhage or evidence of cortically based acute infarction. Gray-white matter differentiation is within normal limits throughout the brain. Vascular: Calcified atherosclerosis at the skull base. No suspicious intracranial vascular hyperdensity. Skull: No acute osseous abnormality identified. Sinuses/Orbits: Generalized mild to moderate paranasal sinus mucosal thickening is new. No sinus fluid levels. Tympanic  cavities and mastoids remain clear. Other: Visualized orbits and scalp soft tissues are within normal limits. ASPECTS [facility] Stroke Program Early CT Score) Total score (0-10 with 10 being normal): 10 IMPRESSION: 1. Stable and normal for age noncontrast CT appearance of the brain. ASPECTS 10. 2. New generalized paranasal sinus inflammation. 3. These results were communicated to Dr. Rory Percy at 11:35 am on 05/02/2020 by text page via the [facility] messaging system. Electronically Signed   By: Genevie Ann M.D.   On: 05/02/2020 11:36       Laurey Morale, MSN, NP-C [facility] [phone]  05/02/2020, 12:00 PM   Attending physician note to follow with Assessment and plan .   Assessment: 70 y.o. female with HTN, DM2, ILD who presented for right hip pain now s/p right hip fx repair. Code stroke initiated and later canceled for AMS. Narcan was given MS did not improve. CTH did not show anything acute.  CTA head and neck was negative for LVO. CTA chest did not show PE. Patient was not a candidate for TPA d/t LKW being outside of the window. ABG showed hypoxia. Given that  Exam also revealed BUE asterixis. AMS most consistent with hypoxia will still complete stroke work-up d/t patient having multiple risk factors for stroke.  CTA chest with multifocal pneumonia. Stroke Risk Factors - diabetes mellitus, hyperlipidemia and hypertension Impression - Toxic metabolic encephalopathy - Pneumonia - Hypoxia - Hypoxic encephalopathy - Evaluate for stroke   Recommendations: -- continue to manage hypoxia per primary team as you are --MRI Brain and stroke work-up only if MRI shows a stroke. Please call us back if the MRI is positive for stroke. --Telemetry monitoring --Frequent neuro checks --Stroke swallow screen    --Please page the Stroke team from 8am-4pm.   You can look them up on [email or web address]

## 2020-05-03 ENCOUNTER — Encounter (HOSPITAL_COMMUNITY): Payer: Self-pay | Admitting: *Deleted

## 2020-05-03 ENCOUNTER — Inpatient Hospital Stay (HOSPITAL_COMMUNITY): Payer: 59

## 2020-05-03 DIAGNOSIS — I5032 Chronic diastolic (congestive) heart failure: Secondary | ICD-10-CM | POA: Diagnosis not present

## 2020-05-03 DIAGNOSIS — W19XXXA Unspecified fall, initial encounter: Secondary | ICD-10-CM | POA: Diagnosis not present

## 2020-05-03 DIAGNOSIS — S72141A Displaced intertrochanteric fracture of right femur, initial encounter for closed fracture: Secondary | ICD-10-CM | POA: Diagnosis not present

## 2020-05-03 DIAGNOSIS — C9210 Chronic myeloid leukemia, BCR/ABL-positive, not having achieved remission: Secondary | ICD-10-CM | POA: Diagnosis not present

## 2020-05-03 LAB — TYPE AND SCREEN
ABO/RH(D): AB POS
Antibody Screen: NEGATIVE
Unit division: 0
Unit division: 0
Unit division: 0
Unit division: 0

## 2020-05-03 LAB — BPAM RBC
Blood Product Expiration Date: 202201202359
Blood Product Expiration Date: 202201242359
Blood Product Expiration Date: 202201292359
Blood Product Expiration Date: 202202012359
ISSUE DATE / TIME: 202201140942
ISSUE DATE / TIME: 202201141447
ISSUE DATE / TIME: 202201151433
ISSUE DATE / TIME: 202201161806
Unit Type and Rh: 6200
Unit Type and Rh: 7300
Unit Type and Rh: 8400
Unit Type and Rh: 8400

## 2020-05-03 LAB — BASIC METABOLIC PANEL
Anion gap: 8 (ref 5–15)
BUN: 13 mg/dL (ref 8–23)
CO2: 21 mmol/L — ABNORMAL LOW (ref 22–32)
Calcium: 8.6 mg/dL — ABNORMAL LOW (ref 8.9–10.3)
Chloride: 113 mmol/L — ABNORMAL HIGH (ref 98–111)
Creatinine, Ser: 0.68 mg/dL (ref 0.44–1.00)
GFR, Estimated: >60 mL/min (ref >60)
Glucose, Bld: 165 mg/dL — ABNORMAL HIGH (ref 70–99)
Potassium: 4.6 mmol/L (ref 3.5–5.1)
Sodium: 142 mmol/L (ref 135–145)

## 2020-05-03 LAB — HEPATIC FUNCTION PANEL
ALT: 12 U/L (ref 0–44)
AST: 44 U/L — ABNORMAL HIGH (ref 15–41)
Albumin: 2.3 g/dL — ABNORMAL LOW (ref 3.5–5.0)
Alkaline Phosphatase: 46 U/L (ref 38–126)
Bilirubin, Direct: 0.5 mg/dL — ABNORMAL HIGH (ref 0.0–0.2)
Indirect Bilirubin: 1.2 mg/dL — ABNORMAL HIGH (ref 0.3–0.9)
Total Bilirubin: 1.7 mg/dL — ABNORMAL HIGH (ref 0.3–1.2)
Total Protein: 5.5 g/dL — ABNORMAL LOW (ref 6.5–8.1)

## 2020-05-03 LAB — CBC
HCT: 21.9 % — ABNORMAL LOW (ref 36.0–46.0)
Hemoglobin: 7.6 g/dL — ABNORMAL LOW (ref 12.0–15.0)
MCH: 30.6 pg (ref 26.0–34.0)
MCHC: 34.7 g/dL (ref 30.0–36.0)
MCV: 88.3 fL (ref 80.0–100.0)
Platelets: 68 10*3/uL — ABNORMAL LOW (ref 150–400)
RBC: 2.48 MIL/uL — ABNORMAL LOW (ref 3.87–5.11)
RDW: 20.6 % — ABNORMAL HIGH (ref 11.5–15.5)
WBC: 17.3 10*3/uL — ABNORMAL HIGH (ref 4.0–10.5)
nRBC: 4.3 % — ABNORMAL HIGH (ref 0.0–0.2)

## 2020-05-03 LAB — MAGNESIUM: Magnesium: 2 mg/dL (ref 1.7–2.4)

## 2020-05-03 LAB — GLUCOSE, CAPILLARY
Glucose-Capillary: 117 mg/dL — ABNORMAL HIGH (ref 70–99)
Glucose-Capillary: 152 mg/dL — ABNORMAL HIGH (ref 70–99)
Glucose-Capillary: 148 mg/dL — ABNORMAL HIGH (ref 70–99)

## 2020-05-03 LAB — TROPONIN I (HIGH SENSITIVITY)
Troponin I (High Sensitivity): 54 ng/L — ABNORMAL HIGH (ref <18)
Troponin I (High Sensitivity): 65 ng/L — ABNORMAL HIGH (ref <18)

## 2020-05-03 LAB — SEDIMENTATION RATE: Sed Rate: 87 mm/hr — ABNORMAL HIGH (ref 0–22)

## 2020-05-03 LAB — PHOSPHORUS: Phosphorus: 1.4 mg/dL — ABNORMAL LOW (ref 2.5–4.6)

## 2020-05-03 LAB — PROCALCITONIN: Procalcitonin: 1.68 ng/mL

## 2020-05-03 MED ORDER — SODIUM CHLORIDE 0.9 % IV SOLN: INTRAVENOUS | Status: DC

## 2020-05-03 MED ORDER — IPRATROPIUM BROMIDE 0.02 % IN SOLN
0.5000 mg | Freq: Three times a day (TID) | RESPIRATORY_TRACT | Status: DC
  Administered 2020-05-03 – 2020-05-04 (×4): 0.5 mg via RESPIRATORY_TRACT
  Filled 2020-05-03 (×4): qty 2.5

## 2020-05-03 MED ORDER — POTASSIUM PHOSPHATES 15 MMOLE/5ML IV SOLN
20.0000 mmol | Freq: Once | INTRAVENOUS | Status: AC
  Administered 2020-05-03: 20 mmol via INTRAVENOUS
  Filled 2020-05-03: qty 6.67

## 2020-05-03 MED ORDER — LEVALBUTEROL HCL 0.63 MG/3ML IN NEBU
0.6300 mg | INHALATION_SOLUTION | Freq: Three times a day (TID) | RESPIRATORY_TRACT | Status: DC
  Administered 2020-05-03 – 2020-05-04 (×4): 0.63 mg via RESPIRATORY_TRACT
  Filled 2020-05-03 (×4): qty 3

## 2020-05-03 MED ORDER — SODIUM CHLORIDE 0.9 % IV BOLUS
500.0000 mL | Freq: Once | INTRAVENOUS | Status: AC
  Administered 2020-05-03: 500 mL via INTRAVENOUS

## 2020-05-03 NOTE — Progress Notes (Signed)
[name].  Patient given meds for pain prior to MRI.  Patient would not let us put her onto exam table.Kathy KitchenMarland Irwin

## 2020-05-03 NOTE — TOC Initial Note (Signed)
Transition of Care [facility]) - Initial/Assessment Note    Patient Details  Name: Kathy Irwin MRN: [id number] Date of Birth: 1950/04/28  [facility]) CM/SW Contact:    Trula Ore, [place] Phone Number: 05/03/2020, 9:50 AM  Clinical Narrative:           CSW received consult for possible SNF placement at time of discharge. CSW spoke with patients daughter [name] regarding PT recommendation of SNF placement at time of discharge. Patients daughter [name] who reports patient comes from home alone.  Patients daughter expressed understanding of PT recommendation and is agreeable to SNF placement at time of discharge. Patients daughter gave CSW permission to fax out initial referral near [place] area.Patient has received the COVID vaccines.No further questions reported at this time. CSW to continue to follow and assist with discharge planning needs.          Expected Discharge Plan: Skilled Nursing Facility Barriers to Discharge: Continued Medical Work up   Patient Goals and CMS Choice   CMS Medicare.gov Compare Post Acute Care list provided to:: Patient Represenative (must comment) (daughter [place]) Choice offered to / list presented to : Adult Children [name])  Expected Discharge Plan and Services Expected Discharge Plan: [place]       Living arrangements for the past 2 months: Single Family Home                                      Prior Living Arrangements/Services Living arrangements for the past 2 months: Single Family Home Lives with:: Self Patient language and need for interpreter reviewed:: Yes Do you feel safe going back to the place where you live?: No   SNF  Need for Family Participation in Patient Care: Yes (Comment) Care giver support system in place?: Yes (comment)   Criminal Activity/Legal Involvement Pertinent to Current Situation/Hospitalization: No - Comment as needed  Activities of Daily Living Home Assistive  Devices/Equipment: None ADL Screening (condition at time of admission) Patient's cognitive ability adequate to safely complete daily activities?: Yes Is the patient deaf or have difficulty hearing?: No Does the patient have difficulty seeing, even when wearing glasses/contacts?: No Does the patient have difficulty concentrating, remembering, or making decisions?: No Patient able to express need for assistance with ADLs?: No Does the patient have difficulty dressing or bathing?: No Independently performs ADLs?: Yes (appropriate for developmental age) Does the patient have difficulty walking or climbing stairs?: No Weakness of Legs: None Weakness of Arms/Hands: None  Permission Sought/Granted Permission sought to share information with : Case Manager,Family Chief Financial Officer Permission granted to share information with : Yes, Verbal Permission Granted  Share Information with NAME: [name]  Permission granted to share info w AGENCY: SNF  Permission granted to share info w Relationship: daughter  Permission granted to share info w Contact Information: [name] [phone]  Emotional Assessment       Orientation: : Oriented to Self,Oriented to Place Alcohol / Substance Use: Not Applicable Psych Involvement: No (comment)  Admission diagnosis:  Closed right hip fracture ([place]) [S72.001A] Fall, initial encounter [W19.XXXA] Closed right hip fracture, initial encounter ([place]) [S72.001A] Closed intertrochanteric fracture of hip, right, initial encounter [facility]) [S72.141A] Patient Active Problem List   Diagnosis Date Noted  . Closed right hip fracture ([place]) 04/29/2020  . Closed intertrochanteric fracture of hip, right, initial encounter ([place]) 04/29/2020  . Fall 04/29/2020  . Transient hypotension 04/29/2020  .  Cellulitis of right lower leg 11/09/2019  . Sepsis ([place]) 11/09/2019  . Chronic diastolic CHF (congestive heart failure) ([place]) 08/02/2019  . Chronic respiratory  failure with hypoxia ([place]) 08/02/2019  . Acute on chronic respiratory failure with hypoxemia ([place]) 08/02/2019  . Atherosclerosis of aorta ([place]) 01/23/2019  . Interstitial lung disease ([place]) 12/24/2018  . Obesity, Class III, BMI 40-49.9 (morbid obesity) ([place]) 12/24/2018  . Pneumonia due to COVID-19 virus 11/15/2018  . Acute respiratory failure ([place]) 12/19/2017  . 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 Thrombocytopenia ([place]) 12/19/2017  . Insulin-requiring or dependent type II diabetes mellitus ([place]) 12/17/2017  . HTN (hypertension) 12/17/2017  . Essential hypertension 11/05/2017  . 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   PCP:  Kerin Perna, NP Pharmacy:   [id number] [place] [id number]: [phone]: [phone]    Social Determinants of Health (SDOH) Interventions    Readmission Risk Interventions Readmission Risk Prevention Plan 08/12/2019  Transportation Screening Complete  PCP or Specialist Appt within 3-5 Days Complete  [place]RI or [place]  Social Work Consult for [place]anning/Counseling Complete  Palliative Care Screening Not Applicable  Medication Review (Press photographer Complete  Some recent data might be hidden

## 2020-05-03 NOTE — Progress Notes (Signed)
Multiple attempts to encourage pt to have her MRI done. Pain medication given prior to MRI; pt continues to refuse and at times were verbally combative. Pt also did not want her CBG checked. Family member called for update and was told pt had refused her MRI. Family is also requesting to have her pain medication reviewed for in relation to her continuing refusal to eat ,non compliance with her test. Pls address accordingly. Thanks

## 2020-05-03 NOTE — Progress Notes (Signed)
Full pulmonary consult note in AM.  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  Steroid-responsive to previous flares. Has chronic ITP, CMML, class 3 obesity.  Now has sepsis type picture in setting of R hip fracture on 1/13.  Multiple COVID tests neg.  Her CTA chest may show slightly different configuration of GG but a lot of this I suspect is variable inspiration and interstitial edema.  Her O2 needs are at baseline.  She states her breathing is at baseline.  Not really c/w pneumonia or AE-ILD.  HR in 110s, she is having a lot of pain but also trouble with getting too high of narcotic dosing.  Would be helpful to get a UA as UTI could present very similarly. Overall I would sit tight, try to look for other source of fever: LE duplex (even with neg CTA), UA.  Fine for HCAP coverage but again I do not think this is pneumonia.  Discussed with primary team.

## 2020-05-03 NOTE — NC FL2 (Deleted)
[place] LEVEL OF CARE SCREENING TOOL     IDENTIFICATION  Patient Name: Kathy Irwin Birthdate: 1950-06-06 Sex: female Admission Date (Current Location): 04/29/2020  [facility] and [place] Number:  Herbalist and Address:  [facility] [place]. [facility], [place] [address], [place], [place] [address]      Provider Number: [id number]  Attending Physician Name and Address:  Kerney Elbe, DO  Relative Name and Phone Number:  [name] [phone]    Current Level of Care: Hospital Recommended Level of Care: [place] Prior Approval Number:    Date Approved/Denied:   PASRR Number: [id number]  Discharge Plan: SNF    Current Diagnoses: Patient Active Problem List   Diagnosis Date Noted  . Closed right hip fracture ([place]) 04/29/2020  . Closed intertrochanteric fracture of hip, right, initial encounter ([place]) 04/29/2020  . Fall 04/29/2020  . Transient hypotension 04/29/2020  . Cellulitis of right lower leg 11/09/2019  . Sepsis ([place]) 11/09/2019  . Chronic diastolic CHF (congestive heart failure) ([place]) 08/02/2019  . Chronic respiratory failure with hypoxia ([place]) 08/02/2019  . Acute on chronic respiratory failure with hypoxemia ([place]) 08/02/2019  . Atherosclerosis of aorta ([place]) 01/23/2019  . Interstitial lung disease ([place]) 12/24/2018  . Obesity, Class III, BMI 40-49.9 (morbid obesity) ([place]) 12/24/2018  . Pneumonia due to COVID-19 virus 11/15/2018  . Acute respiratory failure ([place]) 12/19/2017  . Hypersensitivity pneumonitis ([place]) 12/19/2017  . Thrombocytopenia ([place]) 12/19/2017  . 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 HTN (hypertension) 12/17/2017  . Essential hypertension 11/05/2017  . 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[place]) 11/05/2017    Orientation RESPIRATION BLADDER Height & Weight     Self,Place  O2 (Nasal Cannula 3 liters) Incontinent,External catheter (External Urinary Catheter) Weight: 259 lb  4.8 oz (117.6 kg) Height:  5' 2.99" (160 cm)  BEHAVIORAL SYMPTOMS/MOOD NEUROLOGICAL BOWEL NUTRITION STATUS      Incontinent Diet (See Discharge Summary)  AMBULATORY STATUS COMMUNICATION OF NEEDS Skin   Extensive Assist Verbally Other (Comment) (Incision (Closed) thigh Right foam lift dressing to assess site every shift, clean, dry, intact, PRN)                       Personal Care Assistance Level of Assistance  Bathing,Feeding,Dressing Bathing Assistance: Limited assistance Feeding assistance: Limited assistance Dressing Assistance: Limited assistance     Functional Limitations Info  Sight,Hearing,Speech Sight Info: Adequate Hearing Info: Adequate Speech Info: Adequate    SPECIAL CARE FACTORS FREQUENCY  PT (By licensed PT),OT (By licensed OT)     PT Frequency: 5x min weekly OT Frequency: 5x min weekly            Contractures Contractures Info: Not present    Additional Factors Info  Code Status,Allergies,Insulin Sliding Scale Code Status Info: FULL Allergies Info: Other Newspaper ink,Iodine,Merbromin,Tape   Insulin Sliding Scale Info: insulin aspart (novoLOG) injection 0-9 Units 3 times daily with meals       Current Medications (05/03/2020):  This is the current hospital active medication list Current Facility-Administered Medications  Medication Dose Route Frequency Provider Last Rate Last Admin  . 0.9 %  sodium chloride infusion (Manually program via Guardrails IV Fluids)   Intravenous Once Reubin Milan, MD      . 0.9 %  sodium chloride infusion (Manually program via Guardrails IV Fluids)   Intravenous Once [facility], O[place]ir Latif, DO      . 0.9 %  sodium chloride infusion  Intravenous Continuous Raiford Noble [place], [place]      . acetaminophen (TYLENOL) tablet 325-650 mg  325-650 mg Oral Q6H PRN Delray Alt, PA-C   650 mg at 05/01/20 2058  . albuterol (VENTOLIN HFA) 108 (90 Base) MCG/ACT inhaler 1-2 puff  1-2 puff Inhalation Q4H PRN Sheikh, Georgina Quint Latif,  DO      . aspirin EC tablet 81 mg  81 mg Oral Daily Patrecia Pace A, PA-C   81 mg at 05/03/20 1007  . ceFEPIme (MAXIPIME) 2 g in sodium chloride 0.9 % 100 mL IVPB  2 g Intravenous Q8H Sheikh, O[place]ir Konterra, DO 200 mL/hr at 05/03/20 0550 2 g at 05/03/20 0550  . dextrose 50 % solution 12.5 g  12.5 g Intravenous PRN Delray Alt, PA-C      . diphenhydrAMINE (BENADRYL) injection 25 mg  25 mg Intravenous Once Vonzella Nipple, NP      . docusate sodium (COLACE) capsule 100 mg  100 mg Oral BID Patrecia Pace A, PA-C   100 mg at 05/03/20 1006  . famotidine (PEPCID) tablet 40 mg  40 mg Oral Daily Patrecia Pace A, PA-C   40 mg at 05/03/20 1006  . fluticasone (FLONASE) 50 MCG/ACT nasal spray 2 spray  2 spray Each Nare Daily PRN Delray Alt, PA-C      . HYDROcodone-acetaminophen (NORCO/VICODIN) 5-325 MG per tablet 1-2 tablet  1-2 tablet Oral Q6H PRN Delray Alt, PA-C   2 tablet at 05/01/20 2244  . insulin aspart (novoLOG) injection 0-9 Units  0-9 Units Subcutaneous TID WC Patrecia Pace A, PA-C   2 Units at 05/02/20 1655  . ipratropium (ATROVENT) nebulizer solution 0.5 mg  0.5 mg Nebulization TID Raiford Noble Latif, DO   0.5 mg at 05/03/20 0818  . levalbuterol (XOPENEX) nebulizer solution 0.63 mg  0.63 mg Nebulization TID Raiford Noble Latif, DO   0.63 mg at 05/03/20 6812  . methocarbamol (ROBAXIN) tablet 500 mg  500 mg Oral Q6H PRN Patrecia Pace A, PA-C       Or  . methocarbamol (ROBAXIN) 500 mg in dextrose 5 % 50 mL IVPB  500 mg Intravenous Q6H PRN Delray Alt, PA-C      . metoCLOPramide (REGLAN) tablet 5-10 mg  5-10 mg Oral Q8H PRN Patrecia Pace A, PA-C       Or  . metoCLOPramide (REGLAN) injection 5-10 mg  5-10 mg Intravenous Q8H PRN Delray Alt, PA-C      . metoprolol succinate (TOPROL-XL) 24 hr tablet 75 mg  75 mg Oral Daily Raiford Noble [place], DO   75 mg at 05/03/20 1006  . metoprolol tartrate (LOPRESSOR) injection 5 mg  5 mg Intravenous Q8H Sheikh, [place]ir [place], DO   5 mg at  05/03/20 0546  . montelukast (SINGULAIR) tablet 10 mg  10 mg Oral QHS Patrecia Pace A, PA-C   10 mg at 05/01/20 2247  . morphine 2 MG/ML injection 0.5-1 mg  0.5-1 mg Intravenous Q3H PRN Patrecia Pace A, PA-C   1 mg at 05/03/20 0756  . Muscle Rub CREA   Topical PRN Raiford Noble Latif, DO      . naloxone [facility]) injection 0.4 mg  0.4 mg Intravenous PRN Raiford Noble Latif, DO   0.4 mg at 05/02/20 1059  . olopatadine (PATANOL) 0.1 % ophthalmic solution 1 drop  1 drop Both Eyes BID PRN Patrecia Pace A, PA-C      . ondansetron [facility]) tablet 4 mg  4 mg  Oral Q6H PRN Delray Alt, PA-C       Or  . ondansetron [facility]) injection 4 mg  4 mg Intravenous Q6H PRN Delray Alt, PA-C      . oxybutynin (DITROPAN) tablet 5 mg  5 mg Oral TID Patrecia Pace A, PA-C   5 mg at 05/03/20 1008  . oxyCODONE (Oxy IR/ROXICODONE) immediate release tablet 5 mg  5 mg Oral Q6H PRN Delray Alt, PA-C      . pantoprazole (PROTONIX) EC tablet 40 mg  40 mg Oral Daily Patrecia Pace A, PA-C   40 mg at 05/03/20 1007  . polyethylene glycol (MIRALAX / GLYCOLAX) packet 17 g  17 g Oral Daily PRN Patrecia Pace A, PA-C      . potassium & sodium phosphates (PHOS-NAK) 280-160-250 MG packet 1 packet  1 packet Oral TID WC & HS Raiford Noble [place], DO   1 packet at 05/03/20 1008  . pravastatin (PRAVACHOL) tablet 40 mg  40 mg Oral QHS Patrecia Pace A, PA-C   40 mg at 05/01/20 2247  . pregabalin (LYRICA) capsule 100 mg  100 mg Oral QHS Patrecia Pace A, PA-C   100 mg at 05/01/20 2247  . protein supplement (ENSURE MAX) liquid  11 oz Oral TID Patrecia Pace A, PA-C   11 oz at 05/03/20 1005  . senna-docusate (Senokot-S) tablet 1 tablet  1 tablet Oral Q0600 Delray Alt, PA-C   1 tablet at 05/02/20 0524  . simethicone (MYLICON) chewable tablet 80 mg  80 mg Oral QID PRN Delray Alt, PA-C      . sodium chloride (OCEAN) 0.65 % nasal spray 1 spray  1 spray Each Nare PRN Delray Alt, PA-C      . sodium chloride 0.9 % bolus 500 mL  500  mL Intravenous Once Sheikh, [place]ir Latif, DO      . sodium chloride 0.9 % bolus 500 mL  500 mL Intravenous Once Sheikh, Omair Latif, DO      . vancomycin (VANCOREADY) IVPB 1750 mg/350 mL  1,750 mg Intravenous Q24H Raiford Noble [place], DO         Discharge Medications: Please see discharge summary for a list of discharge medications.  Relevant Imaging Results:  Relevant Lab Results:   Additional Information [id number]  Trula Ore, LCSWA

## 2020-05-03 NOTE — Progress Notes (Signed)
Patient continuously takes nebulizer treatment off and then states she really needs it.  I kept encouraging her to complete entire TX.  Patient seems confused to some degree.

## 2020-05-03 NOTE — Plan of Care (Signed)
  Problem: Elimination: Goal: Will not experience complications related to urinary retention Outcome: Completed/Met

## 2020-05-03 NOTE — NC FL2 (Signed)
[place] LEVEL OF CARE SCREENING TOOL     IDENTIFICATION  Patient Name: Kathy Irwin Birthdate: 1950/10/15 Sex: female Admission Date (Current Location): 04/29/2020  [facility] and [place] Number:  Herbalist and Address:  [facility] [place]. [facility], [place] [address], [place], [place] [address]      Provider Number: [id number]  Attending Physician Name and Address:  Kerney Elbe, DO  Relative Name and Phone Number:  [name] [phone]    Current Level of Care: Hospital Recommended Level of Care: [place] Prior Approval Number:    Date Approved/Denied:   PASRR Number: [id number]  Discharge Plan: SNF    Current Diagnoses: Patient Active Problem List   Diagnosis Date Noted  . Closed right hip fracture ([place]) 04/29/2020  . Closed intertrochanteric fracture of hip, right, initial encounter ([place]) 04/29/2020  . Fall 04/29/2020  . Transient hypotension 04/29/2020  . Cellulitis of right lower leg 11/09/2019  . Sepsis ([place]) 11/09/2019  . Chronic diastolic CHF (congestive heart failure) ([place]) 08/02/2019  . Chronic respiratory failure with hypoxia ([place]) 08/02/2019  . Acute on chronic respiratory failure with hypoxemia ([place]) 08/02/2019  . Atherosclerosis of aorta ([place]) 01/23/2019  . Interstitial lung disease ([place]) 12/24/2018  . Obesity, Class III, BMI 40-49.9 (morbid obesity) ([place]) 12/24/2018  . Pneumonia due to COVID-19 virus 11/15/2018  . Acute respiratory failure ([place]) 12/19/2017  . Hypersensitivity pneumonitis ([place]) 12/19/2017  . Thrombocytopenia ([place]) 12/19/2017  . Insulin-requiring or dependent type II diabetes mellitus ([place]) 12/17/2017  . HTN (hypertension) 12/17/2017  . Essential hypertension 11/05/2017  . 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[place]) 11/05/2017    Orientation RESPIRATION BLADDER Height & Weight     Self,Place  O2 (nasal cannula 3 liters) Incontinent,External catheter (External Urinary Catheter) Weight: 259 lb  4.8 oz (117.6 kg) Height:  5' 2.99" (160 cm)  BEHAVIORAL SYMPTOMS/MOOD NEUROLOGICAL BOWEL NUTRITION STATUS      Incontinent Diet (See Discharge Summary)  AMBULATORY STATUS COMMUNICATION OF NEEDS Skin   Extensive Assist Verbally Other (Comment) (Incision (Closed) thigh Right foam lift dressing to assess site every shift, clean, dry, intact, PRN)                       Personal Care Assistance Level of Assistance  Bathing,Feeding,Dressing Bathing Assistance: Limited assistance Feeding assistance: Limited assistance Dressing Assistance: Limited assistance     Functional Limitations Info  Sight,Hearing,Speech Sight Info: Adequate Hearing Info: Adequate Speech Info: Adequate    SPECIAL CARE FACTORS FREQUENCY  PT (By licensed PT),OT (By licensed OT)     PT Frequency: 5x min weekly OT Frequency: 5x min weekly            Contractures Contractures Info: Not present    Additional Factors Info  Code Status,Allergies,Insulin Sliding Scale Code Status Info: FULL Allergies Info: Other Newspaper ink,Iodine,Merbromin,Tape   Insulin Sliding Scale Info: insulin aspart (novoLOG) injection 0-9 Units 3 times daily with meals       Current Medications (05/03/2020):  This is the current hospital active medication list Current Facility-Administered Medications  Medication Dose Route Frequency Provider Last Rate Last Admin  . 0.9 %  sodium chloride infusion (Manually program via Guardrails IV Fluids)   Intravenous Once Reubin Milan, MD      . 0.9 %  sodium chloride infusion (Manually program via Guardrails IV Fluids)   Intravenous Once [facility], O[place]ir Latif, DO      . 0.9 %  sodium chloride infusion  Intravenous Continuous Raiford Noble [place], [place]      . acetaminophen (TYLENOL) tablet 325-650 mg  325-650 mg Oral Q6H PRN Delray Alt, PA-C   650 mg at 05/01/20 2058  . albuterol (VENTOLIN HFA) 108 (90 Base) MCG/ACT inhaler 1-2 puff  1-2 puff Inhalation Q4H PRN Sheikh, Georgina Quint Latif,  DO      . aspirin EC tablet 81 mg  81 mg Oral Daily Patrecia Pace A, PA-C   81 mg at 05/03/20 1007  . ceFEPIme (MAXIPIME) 2 g in sodium chloride 0.9 % 100 mL IVPB  2 g Intravenous Q8H Sheikh, [place]ir Konterra, DO 200 mL/hr at 05/03/20 0550 2 g at 05/03/20 0550  . dextrose 50 % solution 12.5 g  12.5 g Intravenous PRN Delray Alt, PA-C      . diphenhydrAMINE (BENADRYL) injection 25 mg  25 mg Intravenous Once Vonzella Nipple, NP      . docusate sodium (COLACE) capsule 100 mg  100 mg Oral BID Patrecia Pace A, PA-C   100 mg at 05/03/20 1006  . famotidine (PEPCID) tablet 40 mg  40 mg Oral Daily Patrecia Pace A, PA-C   40 mg at 05/03/20 1006  . fluticasone (FLONASE) 50 MCG/ACT nasal spray 2 spray  2 spray Each Nare Daily PRN Delray Alt, PA-C      . HYDROcodone-acetaminophen (NORCO/VICODIN) 5-325 MG per tablet 1-2 tablet  1-2 tablet Oral Q6H PRN Delray Alt, PA-C   2 tablet at 05/01/20 2244  . insulin aspart (novoLOG) injection 0-9 Units  0-9 Units Subcutaneous TID WC Patrecia Pace A, PA-C   2 Units at 05/02/20 1655  . ipratropium (ATROVENT) nebulizer solution 0.5 mg  0.5 mg Nebulization TID Raiford Noble Latif, DO   0.5 mg at 05/03/20 0818  . levalbuterol (XOPENEX) nebulizer solution 0.63 mg  0.63 mg Nebulization TID Raiford Noble Latif, DO   0.63 mg at 05/03/20 6812  . methocarbamol (ROBAXIN) tablet 500 mg  500 mg Oral Q6H PRN Patrecia Pace A, PA-C       Or  . methocarbamol (ROBAXIN) 500 mg in dextrose 5 % 50 mL IVPB  500 mg Intravenous Q6H PRN Delray Alt, PA-C      . metoCLOPramide (REGLAN) tablet 5-10 mg  5-10 mg Oral Q8H PRN Patrecia Pace A, PA-C       Or  . metoCLOPramide (REGLAN) injection 5-10 mg  5-10 mg Intravenous Q8H PRN Delray Alt, PA-C      . metoprolol succinate (TOPROL-XL) 24 hr tablet 75 mg  75 mg Oral Daily Raiford Noble [place], DO   75 mg at 05/03/20 1006  . metoprolol tartrate (LOPRESSOR) injection 5 mg  5 mg Intravenous Q8H Sheikh, [place]ir [place], DO   5 mg at  05/03/20 0546  . montelukast (SINGULAIR) tablet 10 mg  10 mg Oral QHS Patrecia Pace A, PA-C   10 mg at 05/01/20 2247  . morphine 2 MG/ML injection 0.5-1 mg  0.5-1 mg Intravenous Q3H PRN Patrecia Pace A, PA-C   1 mg at 05/03/20 0756  . Muscle Rub CREA   Topical PRN Raiford Noble Latif, DO      . naloxone [facility]) injection 0.4 mg  0.4 mg Intravenous PRN Raiford Noble Latif, DO   0.4 mg at 05/02/20 1059  . olopatadine (PATANOL) 0.1 % ophthalmic solution 1 drop  1 drop Both Eyes BID PRN Patrecia Pace A, PA-C      . ondansetron [facility]) tablet 4 mg  4 mg  Oral Q6H PRN Delray Alt, PA-C       Or  . ondansetron [facility]) injection 4 mg  4 mg Intravenous Q6H PRN Delray Alt, PA-C      . oxybutynin (DITROPAN) tablet 5 mg  5 mg Oral TID Patrecia Pace A, PA-C   5 mg at 05/03/20 1008  . oxyCODONE (Oxy IR/ROXICODONE) immediate release tablet 5 mg  5 mg Oral Q6H PRN Delray Alt, PA-C      . pantoprazole (PROTONIX) EC tablet 40 mg  40 mg Oral Daily Patrecia Pace A, PA-C   40 mg at 05/03/20 1007  . polyethylene glycol (MIRALAX / GLYCOLAX) packet 17 g  17 g Oral Daily PRN Patrecia Pace A, PA-C      . potassium & sodium phosphates (PHOS-NAK) 280-160-250 MG packet 1 packet  1 packet Oral TID WC & HS Raiford Noble [place], DO   1 packet at 05/03/20 1008  . pravastatin (PRAVACHOL) tablet 40 mg  40 mg Oral QHS Patrecia Pace A, PA-C   40 mg at 05/01/20 2247  . pregabalin (LYRICA) capsule 100 mg  100 mg Oral QHS Patrecia Pace A, PA-C   100 mg at 05/01/20 2247  . protein supplement (ENSURE MAX) liquid  11 oz Oral TID Patrecia Pace A, PA-C   11 oz at 05/03/20 1005  . senna-docusate (Senokot-S) tablet 1 tablet  1 tablet Oral Q0600 Delray Alt, PA-C   1 tablet at 05/02/20 0524  . simethicone (MYLICON) chewable tablet 80 mg  80 mg Oral QID PRN Delray Alt, PA-C      . sodium chloride (OCEAN) 0.65 % nasal spray 1 spray  1 spray Each Nare PRN Delray Alt, PA-C      . sodium chloride 0.9 % bolus 500 mL  500  mL Intravenous Once Sheikh, Omair Latif, DO      . sodium chloride 0.9 % bolus 500 mL  500 mL Intravenous Once Sheikh, Omair Latif, DO      . vancomycin (VANCOREADY) IVPB 1750 mg/350 mL  1,750 mg Intravenous Q24H Raiford Noble [place], DO         Discharge Medications: Please see discharge summary for a list of discharge medications.  Relevant Imaging Results:  Relevant Lab Results:   Additional Information [phone]  Trula Ore, LCSWA

## 2020-05-03 NOTE — Progress Notes (Signed)
Physical Therapy Treatment Patient Details Name: Kathy Irwin MRN: [id number] DOB: 04-08-1951 Today's Date: 05/03/2020    History of Present Illness 70 yo female with mechanical fall was admitted for IM nailing of her R intertrochanteric fracture.  Has WBAT permitted.  Post op transfusions. Pt with lethargy and head CT negative. MRI pending.  PMHx:  Home O2 use, respiratory failure, pneumonitis, DM, ILD, HTN    PT Comments    Pt calling out and I entered room. Pt very confused and had pulled IV's out and removed O2. SpO2 85% and HR 130. Replaced O2 with SpO2 back up to >90%. Pt incontinent of urine and purewick not functioning. Focused on bed mobility to get pt cleaned up with fresh bed pads underneath. Performed a few bed exercises but didn't attempt further mobility due to confusion and high HR. Left pt with Speech Therapy and nursing present.    Follow Up Recommendations  SNF     Equipment Recommendations  None recommended by PT    Recommendations for Other Services       Precautions / Restrictions Precautions Precautions: Fall Restrictions Weight Bearing Restrictions: Yes RLE Weight Bearing: Weight bearing as tolerated    Mobility  Bed Mobility Overal bed mobility: Needs Assistance Bed Mobility: Rolling Rolling: Total assist         General bed mobility comments: Pt hesitant/resistant to move due to pain in RLE. Assist for all aspects of rolling for pericare and to place clean pads.  Transfers                 General transfer comment: Unable to attempt due to confusion and resting HR 130.  Ambulation/Gait             General Gait Details: Unable   Stairs             Wheelchair Mobility    Modified Rankin (Stroke Patients Only)       Balance                                            Cognition Arousal/Alertness: Awake/alert (but eyes closed throughout) Behavior During Therapy: Restless Overall Cognitive Status:  Impaired/Different from baseline Area of Impairment: Awareness;Problem solving;Following commands;Attention;Orientation;Memory;Safety/judgement                 Orientation Level: Disoriented to;Place;Time;Situation Current Attention Level: Sustained Memory: Decreased short-term memory;Decreased recall of precautions Following Commands: Follows one step commands inconsistently;Follows one step commands with increased time Safety/Judgement: Decreased awareness of safety;Decreased awareness of deficits Awareness: Intellectual Problem Solving: Slow processing;Requires verbal cues;Requires tactile cues;Decreased initiation General Comments: Pt very confused and yelling out in bed. Attempted to reorient pt.      Exercises General Exercises - Lower Extremity Ankle Circles/Pumps: AAROM;Both;10 reps;Supine Heel Slides: AAROM;Right;5 reps;Supine (very limited range due to pain) Hip ABduction/ADduction: AAROM;Right;10 reps;Supine (very limited range due to pain)    General Comments        Pertinent Vitals/Pain Pain Assessment: Faces Faces Pain Scale: Hurts whole lot Pain Location: RLE with any movement Pain Descriptors / Indicators: Moaning (Yelling) Pain Intervention(s): Limited activity within patient's tolerance;Repositioned;Monitored during session    Home Living                      Prior Function            PT Goals (current  goals can now be found in the care plan section) Acute Rehab PT Goals Patient Stated Goal: to get home Progress towards PT goals: Not progressing toward goals - comment (confusion)    Frequency    Min 3X/week      PT Plan Discharge plan needs to be updated    Co-evaluation              AM-PAC PT "6 Clicks" Mobility   Outcome Measure  Help needed turning from your back to your side while in a flat bed without using bedrails?: Total Help needed moving from lying on your back to sitting on the side of a flat bed without using  bedrails?: Total Help needed moving to and from a bed to a chair (including a wheelchair)?: Total Help needed standing up from a chair using your arms (e.g., wheelchair or bedside chair)?: Total Help needed to walk in hospital room?: Total Help needed climbing 3-5 steps with a railing? : Total 6 Click Score: 6    End of Session Equipment Utilized During Treatment: Oxygen Activity Tolerance: Treatment limited secondary to medical complications (Comment) (confusion and incr resting HR) Patient left: in bed;with call bell/phone within reach;Other (comment) (with speech and nursing present) Nurse Communication: Other (comment) (Pt had pulled iv's out and needed new purewick) PT Visit Diagnosis: Muscle weakness (generalized) (M62.81);Pain;History of falling (Z91.81) Pain - Right/Left: Right Pain - part of body: Hip     Time: 0340-3524 PT Time Calculation (min) (ACUTE ONLY): 28 min  Charges:  $Therapeutic Activity: 23-37 mins                     [place] Pager [phone] Office [place] 05/03/2020, 10:15 AM

## 2020-05-03 NOTE — Evaluation (Signed)
Clinical/Bedside Swallow Evaluation Patient Details  Name: Kathy Irwin MRN: [id number] Date of Birth: 10-25-1950  Today's Date: 05/03/2020 Time: SLP Start Time (ACUTE ONLY): 0955 SLP Stop Time (ACUTE ONLY): 1012 SLP Time Calculation (min) (ACUTE ONLY): 17 min  Past Medical History:  Past Medical History:  Diagnosis Date  . Acute respiratory failure with hypoxia ([place]) 12/2018  . Diabetes mellitus without complication ([place])   . 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 Hypertension   . ILD (interstitial lung disease) ([place]) 12/24/2018   Past Surgical History:  Past Surgical History:  Procedure Laterality Date  . ABDOMINAL HYSTERECTOMY    . INTRAMEDULLARY (IM) NAIL INTERTROCHANTERIC Right 04/30/2020   Procedure: INTRAMEDULLARY (IM) NAIL INTERTROCHANTRIC;  Surgeon: Shona Needles, MD;  Location: [facility];  Service: Orthopedics;  Laterality: Right;  Marland Kitchen VIDEO BRONCHOSCOPY Bilateral 04/02/2019   Procedure: VIDEO BRONCHOSCOPY WITH FLUORO;  Surgeon: Marshell Garfinkel, MD;  Location: [facility];  Service: Cardiopulmonary;  Laterality: Bilateral;   HPI:  Pt is 70 y.o. female presenting with a closed intertrochanteric fracture of hip and history of PMH HTN, ILD, chronic respiratory failure ( 2L [place] at night), DM 2, obesity who presented to [facility] 04/29/20 for right hip pain. CXR revealed patchy airspace opacities consistent with acute pneumonia. Code stroke initiated 05/02/20 for AMS. Received a BSE on 11/10/19 which revealed no s/s of aspiration; a full liquid diet was recomended due to lethargy during evaluation.   Assessment / Plan / Recommendation Clinical Impression  Upon entering room, pt refused to follow commands, saying "no" instead of following prompts but became more agreeable with PO trials. Pt did not demonstrate s/s of aspiration when consuming ice chips, thin liquids, purees or solids. Pt required Max cues due to AMS to initiate intake but was able to self-feed with liquids and solids with  good attention to boluses. Pt consecutively consumed two cups of water with no immediate or delayed coughs noted. Recomend regular diet with thin liquids due to pt's ability to consume various consistencies with no s/s of aspiration or dysphagia. Will f/u briefly secondary to pneumonia and AMS. SLP Visit Diagnosis: Dysphagia, unspecified (R13.10)    Aspiration Risk  Mild aspiration risk    Diet Recommendation Regular;Thin liquid   Liquid Administration via: Cup;Straw Medication Administration: Whole meds with liquid Supervision: Patient able to self feed;Full supervision/cueing for compensatory strategies Compensations: Minimize environmental distractions Postural Changes: Seated upright at 90 degrees;Remain upright for at least 30 minutes after po intake    Other  Recommendations Oral Care Recommendations: Oral care BID   Follow up Recommendations Skilled Nursing facility      Frequency and Duration min 2x/week  2 weeks       Prognosis Prognosis for Safe Diet Advancement: Good Barriers to Reach Goals: Cognitive deficits      Swallow Study   General HPI: Pt is 70 y.o. female presenting with a closed intertrochanteric fracture of hip and history of PMH HTN, ILD, chronic respiratory failure ( 2L [place] at night), DM 2, obesity who presented to [facility] 04/29/20 for right hip pain. CXR revealed patchy airspace opacities consistent with acute pneumonia. Code stroke initiated 05/02/20 for AMS. Received a BSE on 11/10/19 which revealed no s/s of aspiration; a full liquid diet was recomended due to lethargy during evaluation. Type of Study: Bedside Swallow Evaluation Previous Swallow Assessment: See HPI Diet Prior to this Study: Regular;Thin liquids Temperature Spikes Noted: No History of Recent Intubation: No Behavior/Cognition: Confused;Requires cueing;Doesn't follow directions Oral Cavity Assessment: Other (comment) (Unable  to visualize) Oral Care Completed by SLP: No Oral Cavity - Dentition:  Other (Comment) (Unable to visualize) Vision: Functional for self-feeding Self-Feeding Abilities: Able to feed self Patient Positioning: Upright in bed Baseline Vocal Quality: Normal Volitional Swallow: Unable to elicit    Oral/Motor/Sensory Function Overall Oral Motor/Sensory Function: Other (comment) (Adequate lingual symmetry, others appear functional but not following commands to assess)   Ice Chips Ice chips: Within functional limits Presentation: Spoon   Thin Liquid Thin Liquid: Within functional limits Presentation: Self Fed;Spoon;Straw    Nectar Thick Nectar Thick Liquid: Not tested   Honey Thick Honey Thick Liquid: Not tested   Puree Puree: Within functional limits Presentation: Spoon   Solid     Solid: Within functional limits Presentation: Kathy Irwin 05/03/2020,11:25 AM

## 2020-05-03 NOTE — Consult Note (Incomplete)
NAME:  Kathy Irwin, MRN:  [id number], DOB:  [birth year], LOS: 4 ADMISSION DATE:  04/29/2020, CONSULTATION DATE:  *** REFERRING MD:  ***, CHIEF COMPLAINT:  ***   Brief History   ***  History of present illness   ***  Past Medical History  ***  Significant Hospital Events   ***  Consults:  ***  Procedures:  ***  Significant Diagnostic Tests:  ***  Micro Data:  ***  Antimicrobials:  ***   Interim history/subjective:  ***  Objective   Blood pressure 137/61, pulse (!) 133, temperature 98 F (36.7 C), temperature source Oral, resp. rate (!) 22, height 5' 2.99" (1.6 m), weight 117.6 kg, SpO2 96 %.        Intake/Output Summary (Last 24 hours) at 05/03/2020 1849 Last data filed at 05/03/2020 1040 Gross per 24 hour  Intake 615 ml  Output 1200 ml  Net -585 ml   Filed Weights   04/30/20 1031 05/01/20 0425 05/02/20 0644  Weight: 115.6 kg 118.2 kg 117.6 kg    Examination: Constitutional: *** in no acute distress Eyes: eyes are anicteric, reactive to light Ears, nose, mouth, and throat: mucous membranes moist, trachea midline Cardiovascular: heart sounds are regular, ext are warm to touch. *** edema Respiratory: *** Gastrointestinal: abdomen is soft with + BS Skin: No rashes, normal turgor Neurologic: *** Psychiatric: ***  Resolved Hospital Problem list   ***  Assessment & Plan:  ***  Best practice:  Diet: *** Pain/Anxiety/Delirium protocol (if indicated): *** VAP protocol (if indicated): *** DVT prophylaxis: *** GI prophylaxis: *** Glucose control: *** Mobility: *** Code Status: *** Family Communication: *** Disposition:    Medical Decision Making    Diagnoses that are immediately life threatening include *** Critical test findings: *** Interventions today to address these diagnoses are *** Likelihood of life-threatening deterioration without intervention is high.  Labs   CBC: Recent Labs  Lab 04/29/20 1205 04/29/20 2133 04/30/20 0334  04/30/20 1142 04/30/20 1431 05/01/20 0301 05/02/20 0224 05/03/20 0428  WBC 7.0   < > 8.7 10.7*  --  10.3 13.9* 17.3*  NEUTROABS 1.6*  --   --   --   --  3.6 6.4  --   HGB 10.5*   < > 7.5* 8.9* 7.8* 6.9* 7.1* 7.6*  HCT 32.7*   < > 23.8* 29.0* 23.0* 21.8* 21.0* 21.9*  MCV 100.6*   < > 102.1* 102.8*  --  96.5 94.6 88.3  PLT 30*   < > 43* 71*  --  47* 65* 68*   < > = values in this interval not displayed.    Basic Metabolic Panel: Recent Labs  Lab 04/29/20 1205 04/30/20 0334 04/30/20 1431 05/01/20 0301 05/02/20 0224 05/03/20 0220 05/03/20 1114  NA 139 138 139 135 137 142  --   K 3.8 4.4 5.8* 4.9 4.6 4.6  --   CL 106 104  --  103 106 113*  --   CO2 24 26  --  24 24 21*  --   GLUCOSE 145* 164*  --  178* 194* 165*  --   BUN 13 21  --  27* 14 13  --   CREATININE 0.68 0.97  --  1.38* 0.71 0.68  --   CALCIUM 9.4 9.0  --  8.6* 8.6* 8.6*  --   MG  --   --   --  1.7 2.1  --  2.0  PHOS  --   --   --  4.6 2.1*  --  1.4*   GFR: Estimated Creatinine Clearance: 82.2 mL/min (by C-G formula based on SCr of 0.68 mg/dL). Recent Labs  Lab 04/30/20 1142 05/01/20 0301 05/02/20 0224 05/02/20 1435 05/03/20 0220 05/03/20 0428  PROCALCITON  --   --   --  1.18 1.68  --   WBC 10.7* 10.3 13.9*  --   --  17.3*  LATICACIDVEN  --   --   --  1.8  --   --     Liver Function Tests: Recent Labs  Lab 05/01/20 0301 05/02/20 0224 05/03/20 1114  AST 32 34 44*  ALT _0 ALKPHOS 38 43 46  BILITOT 1.0 1.2 1.7*  PROT 5.1* 5.3* 5.5*  ALBUMIN 2.9* 2.6* 2.3*   No results for input(s): LIPASE, AMYLASE in the last 168 hours. No results for input(s): AMMONIA in the last 168 hours.  ABG    Component Value Date/Time   PHART 7.450 05/02/2020 1058   PCO2ART 33.7 05/02/2020 1058   PO2ART 44.4 (L) 05/02/2020 1058   HCO3 23.0 05/02/2020 1058   TCO2 28 04/30/2020 1431   ACIDBASEDEF 0.5 05/02/2020 1058   O2SAT 80.5 05/02/2020 1058     Coagulation Profile: Recent Labs  Lab 04/29/20 1205  INR  1.3*    Cardiac Enzymes: No results for input(s): CKTOTAL, CKMB, CKMBINDEX, TROPONINI in the last 168 hours.  HbA1C: Hemoglobin A1C  Date/Time Value Ref Range Status  02/05/2020 09:30 AM 5.6 4.0 - 5.6 % Final   Hgb A1c MFr Bld  Date/Time Value Ref Range Status  04/30/2020 03:34 AM 6.7 (H) 4.8 - 5.6 % Final    Comment:    (NOTE) Pre diabetes:          5.7%-6.4%  Diabetes:              >6.4%  Glycemic control for   <7.0% adults with diabetes   11/08/2019 05:05 PM 7.2 (H) 4.8 - 5.6 % Final    Comment:    (NOTE) Pre diabetes:          5.7%-6.4%  Diabetes:              >6.4%  Glycemic control for   <7.0% adults with diabetes     CBG: Recent Labs  Lab 05/02/20 1256 05/02/20 1638 05/02/20 2149 05/03/20 0737 05/03/20 1212  GLUCAP 150* 170* 137* 117* 152*    Review of Systems:   ***  Past Medical History  She,  has a past medical history of Acute respiratory failure with hypoxia ([place]) (12/2018), Diabetes mellitus without complication ([place]), Hypersensitivity pneumonitis ([place]) (12/19/2017), Hypertension, and ILD (interstitial lung disease) ([place]) (12/24/2018).   Surgical History    Past Surgical History:  Procedure Laterality Date  . ABDOMINAL HYSTERECTOMY    . INTRAMEDULLARY (IM) NAIL INTERTROCHANTERIC Right 04/30/2020   Procedure: INTRAMEDULLARY (IM) NAIL INTERTROCHANTRIC;  Surgeon: Shona Needles, MD;  Location: [facility];  Service: Orthopedics;  Laterality: Right;  Marland Kitchen VIDEO BRONCHOSCOPY Bilateral 04/02/2019   Procedure: VIDEO BRONCHOSCOPY WITH FLUORO;  Surgeon: Marshell Garfinkel, MD;  Location: [facility];  Service: Cardiopulmonary;  Laterality: Bilateral;     Social History   reports that she has never smoked. She has never used smokeless tobacco. She reports that she does not drink alcohol and does not use drugs.   Family History   Her family history includes Diabetes in an other family member; Hypertension in an other family member.   Allergies Allergies   Allergen Reactions  . Other Shortness Of  Breath and Other (See Comments)    Newspaper ink =  new chest pain, also  . Iodine Other (See Comments)    Unknown reaction  . Merbromin Other (See Comments)    Unknown reaction - Mercurochrome- "Was a long time ago"   . Tape Rash     Home Medications  Prior to Admission medications   Medication Sig Start Date End Date Taking? Authorizing Provider  acetaminophen (TYLENOL) 500 MG tablet Take 1,000 mg by mouth at bedtime.   Yes [provider]  albuterol (VENTOLIN HFA) 108 (90 Base) MCG/ACT inhaler Inhale 1-2 puffs into the lungs every 6 (six) hours as needed for wheezing or shortness of breath. 02/11/20  Yes Kerin Perna, NP  [employer] D3 125 MCG (5000 UT) capsule Take 5,000 Units by mouth daily. 07/16/19  Yes [provider]  Ensure Max Protein (ENSURE MAX PROTEIN) LIQD Take 330 mLs (11 oz total) by mouth 3 (three) times daily. 12/01/19  Yes Pokhrel, Laxman, MD  famotidine (PEPCID) 40 MG tablet Take 1 tablet (40 mg total) by mouth daily. 12/02/19  Yes Pokhrel, Laxman, MD  fluticasone (FLONASE) 50 MCG/ACT nasal spray Place 2 sprays into both nostrils daily as needed for allergies or rhinitis. 02/05/20  Yes Kerin Perna, NP  furosemide (LASIX) 20 MG tablet Take 1 pill in the morning Patient taking differently: Take 20 mg by mouth daily. 02/05/20  Yes Kerin Perna, NP  glimepiride (AMARYL) 4 MG tablet Take 4 mg by mouth daily. 02/24/20  Yes [provider]  magnesium gluconate (MAGONATE) 30 MG tablet Take 1 tablet (30 mg total) by mouth 2 (two) times daily. Patient taking differently: Take 30 mg by mouth daily. 01/01/20  Yes Kerin Perna, NP  metFORMIN (GLUCOPHAGE) 1000 MG tablet Take 1 tablet (1,000 mg total) by mouth 2 (two) times daily with a meal. Patient taking differently: Take 1,000 mg by mouth daily with breakfast. 12/30/19  Yes Kerin Perna, NP  methocarbamol (ROBAXIN) 500 MG tablet Take 1  tablet (500 mg total) by mouth every 8 (eight) hours as needed for muscle spasms. 02/05/20  Yes Kerin Perna, NP  metoprolol succinate (TOPROL-XL) 25 MG 24 hr tablet Take 1 tablet (25 mg total) by mouth daily. Patient taking differently: Take 25 mg by mouth See admin instructions. Take 1 tablet (25 mg) combine with 1 tablet (50 mg) to make 75 mg totally by mouth every day 02/05/20  Yes Kerin Perna, NP  metoprolol succinate (TOPROL-XL) 50 MG 24 hr tablet TAKE 1 TABLET BY MOUTH EVERY DAY IN THE MORNING Patient taking differently: Take 50 mg by mouth See admin instructions. Take 1 tablet (50 mg) combine with 1 tablet (25 mg) to make 75 mg totally by mouth every day 04/07/20  Yes Kerin Perna, NP  montelukast (SINGULAIR) 10 MG tablet Take 10 mg by mouth at bedtime. 03/05/19  Yes [provider]  Multiple Vitamin (MULTIVITAMIN WITH MINERALS) TABS tablet Take 1 tablet by mouth daily. 12/02/19  Yes Pokhrel, Laxman, MD  olopatadine (PATANOL) 0.1 % ophthalmic solution Place 1 drop into both eyes 2 (two) times daily. Patient taking differently: Place 1 drop into both eyes 2 (two) times daily as needed for allergies (itchy eyes). 07/24/19  Yes Wieters, Hallie C, PA-C  ondansetron (ZOFRAN) 4 MG tablet Take 1 tablet (4 mg total) by mouth every 8 (eight) hours as needed for nausea or vomiting. 08/15/19  Yes Rancour, Annie Main, MD  oxybutynin (DITROPAN) 5 MG  tablet Take 1 tablet (5 mg total) by mouth 3 (three) times daily. 04/26/20  Yes Kerin Perna, NP  pantoprazole (PROTONIX) 40 MG tablet Take 1 tablet (40 mg total) by mouth daily. 12/01/19  Yes Pokhrel, Laxman, MD  polyethylene glycol (MIRALAX / GLYCOLAX) 17 g packet Take 17 g by mouth daily as needed for mild constipation or moderate constipation. 12/01/19  Yes Pokhrel, Laxman, MD  pravastatin (PRAVACHOL) 40 MG tablet Take 1 tablet (40 mg total) by mouth at bedtime. 12/30/19  Yes Kerin Perna, NP  pregabalin (LYRICA) 100 MG  capsule Take 1 capsule (100 mg total) by mouth at bedtime. 02/05/20  Yes Kerin Perna, NP  senna-docusate (SENOKOT-S) 8.6-50 MG tablet Take 1 tablet by mouth 2 (two) times daily. 11/24/19  Yes Domenic Polite, MD  simethicone (MYLICON) 80 MG chewable tablet Chew 1 tablet (80 mg total) by mouth 4 (four) times daily as needed for flatulence. 12/01/19  Yes Pokhrel, Laxman, MD  sodium chloride (OCEAN) 0.65 % SOLN nasal spray Place 1 spray into both nostrils as needed for congestion. 08/12/19  Yes Dessa Phi, DO  tamsulosin (FLOMAX) 0.4 MG CAPS capsule Take 1 capsule (0.4 mg total) by mouth daily. 03/22/20  Yes Kerin Perna, NP  blood glucose meter kit and supplies KIT Dispense based on patient and insurance preference. Use up to four times daily as directed. (FOR ICD-9 250.00, 250.01). For QAC - HS accuchecks. 11/22/18   Thurnell Lose, MD  blood glucose meter kit and supplies Dispense based on patient and insurance preference. Use up to four times daily as directed. (FOR ICD-10 E10.9, E11.9). 12/30/19   Kerin Perna, NP  cetirizine (ZYRTEC ALLERGY) 10 MG tablet Take 1 tablet (10 mg total) by mouth daily for 7 days. Patient not taking: Reported on 04/29/2020 03/17/20 03/24/20  Lannie Fields, PA-C  glucose blood (FREESTYLE LITE) test strip For glucose testing every before meals at bedtime. Diagnosis E 11.65  Can substitute to any accepted brand 11/22/18   Thurnell Lose, MD  linaclotide [facility]) 145 MCG CAPS capsule Take 1 capsule (145 mcg total) by mouth daily before breakfast. 12/02/19   Pokhrel, Corrie Mckusick, MD  sertraline (ZOLOFT) 100 MG tablet Take 1 pill at bedtime Patient not taking: No sig reported 12/30/19   Kerin Perna, NP     Critical care time: ***

## 2020-05-03 NOTE — Progress Notes (Signed)
PROGRESS NOTE    Ramata Strothman  ZOX:[id number] DOB: Dec 08, 1950 DOA: 04/29/2020 PCP: Kerin Perna, NP  Brief Narrative:  HPI per Dr. Fuller Plan on 04/30/19 Sheretha Shadd is a 70 y.o. female with medical history significant of HTN, interstitial lung disease, chronic respiratory failure with hypoxia on 2 L at night, DM type II, ITP,  CMML-1, and obesity presents after having a fall with right hip pain.  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  She landed on her right hip and reported severe pain thereafter.  Due to the pain she was unable to stand up or bear weight on the leg, and any kind of movement of that leg worsening pain.  At baseline patient currently feels short of breath is unchanged and she has been in her normal state of health otherwise prior to falling this morning.  She had not taken any of her home blood pressure medications.  ED Course: Admission as a emergency department patient was seen to be afebrile with respirations 30-34, and all other vital signs maintain. Labs significant for hemoglobin 10.5, platelets 30, and INR 1.3. X-rays revealed a communicated fracture of the right hip. Orthopedics was formally consulted with plans to take for surgical correction tomorrow. TRH called to admit.  **Interim History Was given another pack of platelets today (3 units total) and typed and screened and transfused 1 unit PRBCs again (3 units total). She underwent operative intervention and had intramedullary nail of the right femur fracture percutaneous fixation of the right femoral neck fracture.  This operatively she was doing okay but today she was extremely altered and encephalopathic.  She had decreased responsiveness and initially was thought from her narcotic medication so she was given some Narcan but this had minimal response.  ABG was checked and she was hypoxic.  Neurology  was consulted for further evaluation as well and they activated a code stroke with this was canceled later than on.  CT of the head showed no acute process and she also had a CTA of the head and neck.  Neurology recommending MRI given her hypoxia to rule out acute CVA and this was attempted twice today but patient was uncooperative.  Remains confused but a little bit better today than she was yesterday.  Still continues to have tachycardia  Assessment & Plan:   Principal Problem:   Closed intertrochanteric fracture of hip, right, initial encounter [facility]) Active Problems:   Interstitial lung disease ([place])   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[place])   Chronic diastolic CHF (congestive heart failure) (H[facility])   Fall   Transient hypotension  Communicated intertrochanteric right femur fracture secondary to fall status post intramedullary ankle of the right femur fracture and percutaneous fixation of the right femoral neck fracture postoperative day 3 -Acute.  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  Found to have a communicated intertrochanteric femur fracture on the right.  Orthopedics was consulted and took the patient for Surgical Intervention as an outpatient  -Admit to medical telemetry bed -Hip Fracture order set utilized -Hydrocodone/morphine IV as needed for moderate-severe pain respectively.  This may need to be adjusted given her encephalopathy as below -Appreciate orthopedics consultative services we will follow for further recommendations -Defer Pain Control and VTE prophylaxis to Ortho -PT recommending SNF; TOC consulted for assistance with Placement   Chronic ITP -Platelet  count on admission 30,000 and trended up to 48,000 and then drop down to 43 again this morning.  Patient previously responsive to steroids in the past.  Discussed with Dr.Kale who recommended getting platelet count up to at least 50,000. Patient was typed and screened while in the  emergency department -Transfuse 3 unit of platelets total and now platelet count postsurgery is 68,000 after surgery -Continue to monitor transfuse as needed to goal of at least 50,000 platelets prior to surgery -Appreciate oncology consultative services  and they will follow-up with the patient again tomorrow  Transient Hypotension -Acute. On admission patient blood pressures noted to drop as low as 78/41 yesterday, but improved with IV bolus of 1.5 L normal saline IV fluids.  Patient reports that she had not taken her home medications of Prozac 20 mg daily or metoprolol 75 mg daily. -Continue Hold home blood pressure medications  -Give additional IV fluids as needed and given 2 L yesterday and will give half a liter today.  Ters todayand now on NS Maintenance  -Hold all Hypertensive Medications -Continue monitor blood pressures per protocol  Acute Encephalopathy in the setting of Hypoxia and Infection vs. Medication induced -Had AMS and was difficult to arouse this AM and Neurology evaluated for CODE Stroke -STAT Head CT done and showed no acute Process -CTA Head and Neck with No Emergent LVO  -CTA Chest with Multifocal PNA -ABG done and showed a PO2 of 44 -Given a Dose of Narcan given that she was given Narcotics for Pain; Narcan with minimum response -Will Treat Infection as below and continue antibiotics -Continue to Monitor and Trend -Neurology recommending MRI of Brain given Hypoxemia when Stable and reconsult if it is positive CVA and this is still pending to be done -Obtain Cx as below -Correct Electrolytes if Necessary  -Continue to Follow  -Place on Delirium Precautions  Severe Sepsis 2/2 to PNA, Likely present on Admission and worsened -Patient acutely decompensated today and worsened; Daughter states that prior to coming to the Hospital her mother had a new cough -DG Chest Xray on Admission showed "Lungs are clear. Heart size and pulmonary vascularity are normal. No  pneumothorax. No adenopathy. No bone lesions." but likely did not show due to Dehydration   -WBC went from 10.3 -> 13.9 and is further worsening to 17.3; She is tachycardic, tachypenic and febrile now at 101.4 yesterday and today she is not febrile she remains tachycardic and tachypneic -CTA showed "There is no evidence of large central pulmonary embolus seen in the main pulmonary artery or main portions of the left and right pulmonary arteries. Evaluation of the lower lobe branches of both pulmonary arteries is limited due to respiratory motion artifact and attenuation due to body habitus. Smaller and more peripheral pulmonary emboli cannot be excluded on the basis of this exam. 2. Patchy airspace opacities are noted throughout both lungs which may represent multifocal pneumonia."  -Give 1.5 Liter Bolus and a unit of Blood yesterday but was also given a 500 mL bolus yesterday evening and this morning -Checked COVID Again and Negative; Respiratory Virus Panel Negative As well  -PCT was 1.18 and is trended up in 1.68; Will obtain Blood Cx x2, Check UA/Urine Cx -Start Xopenex/Atrovent -Repeat CXR in the AM -Empirically Start Abx with IV Vancomycin and IV Cefepime continue for now -Checked Inflammatory Markers and showed LDH of 590, Ferritin of 1485, CRP 30.0, LA of 1.8, Likely elevated in the setting of Multifocal PNA -Check SLP for concern for  Aspiration they are recommending a regular diet with thin liquids  Sinus Tachycardia, persistently elevated -Patient's HR Ranging from 130's-150's: Now with better today in the 130s -D-Dimer was elevated at 1.64 -No PE on CTA -Given 1.5 Liters and will give a unit of Blood but then received an additional 500 mL bolus and then 500 mL bolus this morning -Continue to Monitor BP -Physiologic Response from Above  Hypophosphatemia -Patient's Phos Level was 2.1 and trended down to 1.4 -Replete with IV KCl 20 mmol -Continue to Monitor and Replete as  Necessary -Repeat Phos Level in the AM  AKI, improved  -In the setting of Hypotension and Acute Surgical Intervention and Anemia -Patient's Hgb/Hct has dropped from 10.5/32.7 on Admission and is now 6.9/21.8 this AM so could be from Hypovolemia -Getting IVF and Blood -Patient's BUN/Cr went from 13/0.68 -> 21/0.97 -> 27/1.38 -> 14/0.71 -Avoid Nephrotoxic Medications, Contrast Dyes, Hypotension and Renally adjust medications -Repeat CMP in the AM   Acute on Chronic respiratory failure/interstitial lung disease -Patient reports that she chronically has shortness of breath is unchanged from previous.  Only utilizing oxygen at night -SpO2: 96 % O2 Flow Rate (L/min): 3 L/min FiO2 (%): 32 % -Continue oxygen as needed -See as Above  -Chest x-ray today showed "Multifocal bilateral patchy and indistinct pulmonary opacity is new compared to 04/29/2020, and compatible with Acute Pneumonia superimposed on chronic interstitial lung disease (which was demonstrated on Chest CTs 2021 and earlier)." -We will need an ambulatory home O2 screen prior to discharge -May consider a Pulmonary Consult in the morning if still not improving  Macrocytic Anemia/Acute Blood Loss Anemia in the Setting of Post-Surgical Drop -Hemoglobin 10.5 on admission which appears near patient's baseline of 10-11 g/dL.  -Slowly started trending down and went down 7.5/23.8 and so he was typed and screened and transfused 1 unit PRBCs; could have been it partly dilutional drop given that she is getting IV fluids -Posttransfusion it was 7.8/23.0 and has dropped further to 6.9/21.8 yesterday; Now Hgb/Hct is now 7.1/21.0; after the third unit her hemoglobin/hematocrit trended up to 7.6/31.9 -She is status post 3 units of transfused PRBCs -Continue to Monitor for S/Sx of Bleeding; No overt bleeding noted  -Repeat CBC in the AM   CMML-1 -Patient not on treatment at this time.  Followed in the outpatient setting by Dr. Irene Limbo. -Oncology  Consulted may need further intervention given  Diabetes mellitus type 2, controlled -Last hemoglobin A1c noted to be 5.6 on 02/05/2020.  Home medications include metformin 1000 mg daily and Amaryl 4 mg daily. -Hypoglycemic protocols -Check hemoglobin A1c in a.m. -Hold home oral medications -CBGs before every meal with sensitive SSI -CBGs ranging from 737-106  Chronic Diastolic CHF -Last EF was noted to be greater than 70-75% in 07/2019.  Patient appears to be euvolemic at this time. -Daily weights -Strict intake and output -She is +5.043 L since admission given her transfusions -Currently she is still getting normal saline at 75 MLS per hour  GERD -Continue home PPI with pantoprazole 40 minutes daily  Hyperlipidemia -Continue Pravastatin 40 mg p.o. nightly  Morbid Obesity -Complicates overall prognosis and care -Estimated body mass index is 45.94 kg/m as calculated from the following:   Height as of this encounter: 5' 2.99" (1.6 m).   Weight as of this encounter: 117.6 kg.  -Weight Loss and Dietary Counseling given  DVT prophylaxis: SCDs for now on further care per Ortho Code Status: FULL CODE  Family Communication: No family present at bedside  Disposition Plan: Pending evaluation by PT and OT clearance by orthopedic surgery  Status is: Inpatient  Remains inpatient appropriate because:IV treatments appropriate due to intensity of illness or inability to take PO and Inpatient level of care appropriate due to severity of illness   Dispo: The patient is from: Home              Anticipated d/c is to: TBD              Anticipated d/c date is: 2 days              Patient currently is not medically stable to d/c.  Consultants:   Orthopedic Surgery  Neurology  Procedures:   Antimicrobials:  Anti-infectives (From admission, onward)   Start     Dose/Rate Route Frequency Ordered Stop   05/03/20 1400  vancomycin (VANCOREADY) IVPB 1750 mg/350 mL        1,750 mg 175  mL/hr over 120 Minutes Intravenous Every 24 hours 05/02/20 1317     05/02/20 1400  vancomycin (VANCOCIN) 2,500 mg in sodium chloride 0.9 % 500 mL IVPB        2,500 mg 250 mL/hr over 120 Minutes Intravenous  Once 05/02/20 1305 05/02/20 1850   05/02/20 1315  ceFEPIme (MAXIPIME) 2 g in sodium chloride 0.9 % 100 mL IVPB        2 g 200 mL/hr over 30 Minutes Intravenous Every 8 hours 05/02/20 1305     04/30/20 2200  ceFAZolin (ANCEF) IVPB 2g/100 mL premix        2 g 200 mL/hr over 30 Minutes Intravenous Every 8 hours 04/30/20 1810 05/01/20 1443   04/30/20 1438  vancomycin (VANCOCIN) powder  Status:  Discontinued          As needed 04/30/20 1438 04/30/20 1603   04/30/20 0600  ceFAZolin (ANCEF) IVPB 2g/100 mL premix        2 g 200 mL/hr over 30 Minutes Intravenous On call to O.R. 04/29/20 2128 04/30/20 1257        Subjective: Seen and examined at bedside and she is not as encephalopathic as she was yesterday but still very confused.  She answers some questions but then becomes repetitive.  MRI attempted today twice and was unsuccessful.  Continues to be tachycardic but not as high as yesterday.  Labs are slightly worsening but will continue antibiotics for now.  Will discuss with pulmonary about consultation given her history of ILD and multifocal pneumonia.  No other concerns or complaints at this time.  Objective: Vitals:   05/03/20 0818 05/03/20 1006 05/03/20 1254 05/03/20 1551  BP:  (!) 132/57 137/61   Pulse:  (!) 131 (!) 133   Resp:   (!) 22   Temp:   98 F (36.7 C)   TempSrc:   Oral   SpO2: (!) 86%  98% 96%  Weight:      Height:        Intake/Output Summary (Last 24 hours) at 05/03/2020 1816 Last data filed at 05/03/2020 1040 Gross per 24 hour  Intake 615 ml  Output 1200 ml  Net -585 ml   Filed Weights   04/30/20 1031 05/01/20 0425 05/02/20 0644  Weight: 115.6 kg 118.2 kg 117.6 kg   Examination: Physical Exam:  Constitutional: WN/WD obese African-American female  currently in no acute distress she is appears calm but uncomfortable again and remains confused but not as much as yesterday Eyes: Lids and conjunctivae normal, sclerae anicteric  ENMT: External  Ears, Nose appear normal. Grossly normal hearing. Neck: Appears normal, supple, no cervical masses, normal ROM, no appreciable thyromegaly; no JVD Respiratory: Diminished to auscultation bilaterally, no wheezing, rales, rhonchi or crackles. Normal respiratory effort and patient is not tachypenic. No accessory muscle use.  Unlabored breathing Cardiovascular: Echocardiac rate but regular rhythm, no murmurs / rubs / gallops. S1 and S2 auscultated.  Abdomen: Soft, non-tender, distended secondary to body habitus.  Bowel sounds positive.  GU: Deferred. Musculoskeletal: No clubbing / cyanosis of digits/nails. No joint deformity upper and lower extremities.  Skin: No rashes, lesions, ulcers on limited skin evaluation. No induration; Warm and dry.  Neurologic: CN 2-12 grossly intact with no focal deficits.  Romberg sign and cerebellar reflexes not assessed.  Psychiatric: Impaired judgment and insight.  She is awake and alert and oriented x 1.  Confused mood and appropriate affect.   Data Reviewed: I have personally reviewed following labs and imaging studies  CBC: Recent Labs  Lab 04/29/20 1205 04/29/20 2133 04/30/20 0334 04/30/20 1142 04/30/20 1431 05/01/20 0301 05/02/20 0224 05/03/20 0428  WBC 7.0   < > 8.7 10.7*  --  10.3 13.9* 17.3*  NEUTROABS 1.6*  --   --   --   --  3.6 6.4  --   HGB 10.5*   < > 7.5* 8.9* 7.8* 6.9* 7.1* 7.6*  HCT 32.7*   < > 23.8* 29.0* 23.0* 21.8* 21.0* 21.9*  MCV 100.6*   < > 102.1* 102.8*  --  96.5 94.6 88.3  PLT 30*   < > 43* 71*  --  47* 65* 68*   < > = values in this interval not displayed.   Basic Metabolic Panel: Recent Labs  Lab 04/29/20 1205 04/30/20 0334 04/30/20 1431 05/01/20 0301 05/02/20 0224 05/03/20 0220 05/03/20 1114  NA 139 138 139 135 137 142  --    K 3.8 4.4 5.8* 4.9 4.6 4.6  --   CL 106 104  --  103 106 113*  --   CO2 24 26  --  24 24 21*  --   GLUCOSE 145* 164*  --  178* 194* 165*  --   BUN 13 21  --  27* 14 13  --   CREATININE 0.68 0.97  --  1.38* 0.71 0.68  --   CALCIUM 9.4 9.0  --  8.6* 8.6* 8.6*  --   MG  --   --   --  1.7 2.1  --  2.0  PHOS  --   --   --  4.6 2.1*  --  1.4*   GFR: Estimated Creatinine Clearance: 82.2 mL/min (by C-G formula based on SCr of 0.68 mg/dL). Liver Function Tests: Recent Labs  Lab 05/01/20 0301 05/02/20 0224 05/03/20 1114  AST 32 34 44*  ALT _0 ALKPHOS 38 43 46  BILITOT 1.0 1.2 1.7*  PROT 5.1* 5.3* 5.5*  ALBUMIN 2.9* 2.6* 2.3*   No results for input(s): LIPASE, AMYLASE in the last 168 hours. No results for input(s): AMMONIA in the last 168 hours. Coagulation Profile: Recent Labs  Lab 04/29/20 1205  INR 1.3*   Cardiac Enzymes: No results for input(s): CKTOTAL, CKMB, CKMBINDEX, TROPONINI in the last 168 hours. BNP (last 3 results) No results for input(s): PROBNP in the last 8760 hours. HbA1C: No results for input(s): HGBA1C in the last 72 hours. CBG: Recent Labs  Lab 05/02/20 1256 05/02/20 1638 05/02/20 2149 05/03/20 0737 05/03/20 1212  GLUCAP 150* 170* 137* 117*  152*   Lipid Profile: No results for input(s): CHOL, HDL, LDLCALC, TRIG, CHOLHDL, LDLDIRECT in the last 72 hours. Thyroid Function Tests: No results for input(s): TSH, T4TOTAL, FREET4, T3FREE, THYROIDAB in the last 72 hours. Anemia Panel: Recent Labs    05/02/20 1435  FERRITIN 1,485*   Sepsis Labs: Recent Labs  Lab 05/02/20 1435 05/03/20 0220  PROCALCITON 1.18 1.68  LATICACIDVEN 1.8  --     Recent Results (from the past 240 hour(s))  Resp Panel by RT-PCR (Flu A&B, Covid) Nasopharyngeal Swab     Status: None   Collection Time: 04/29/20 12:59 PM   Specimen: Nasopharyngeal Swab; Nasopharyngeal(NP) swabs in vial transport medium  Result Value Ref Range Status   SARS Coronavirus 2 by RT PCR  NEGATIVE NEGATIVE Final    Comment: (NOTE) SARS-CoV-2 target nucleic acids are NOT DETECTED.  The SARS-CoV-2 RNA is generally detectable in upper respiratory specimens during the acute phase of infection. The lowest concentration of SARS-CoV-2 viral copies this assay can detect is 138 copies/mL. A negative result does not preclude SARS-Cov-2 infection and should not be used as the sole basis for treatment or other patient management decisions. A negative result may occur with  improper specimen collection/handling, submission of specimen other than nasopharyngeal swab, presence of viral mutation(s) within the areas targeted by this assay, and inadequate number of viral copies(<138 copies/mL). A negative result must be combined with clinical observations, patient history, and epidemiological information. The expected result is Negative.  Fact Sheet for Patients:  EntrepreneurPulse.com.au  Fact Sheet for Healthcare Providers:  IncredibleEmployment.be  This test is no t yet approved or cleared by the [place] FDA and  has been authorized for detection and/or diagnosis of SARS-CoV-2 by FDA under an Emergency Use Authorization (EUA). This EUA will remain  in effect (meaning this test can be used) for the duration of the COVID-19 declaration under Section 564(b)(1) of the Act, 21 U.S.C.section 360bbb-3(b)(1), unless the authorization is terminated  or revoked sooner.       Influenza A by PCR NEGATIVE NEGATIVE Final   Influenza B by PCR NEGATIVE NEGATIVE Final    Comment: (NOTE) The Xpert Xpress SARS-CoV-2/FLU/RSV plus assay is intended as an aid in the diagnosis of influenza from Nasopharyngeal swab specimens and should not be used as a sole basis for treatment. Nasal washings and aspirates are unacceptable for Xpert Xpress SARS-CoV-2/FLU/RSV testing.  Fact Sheet for Patients: EntrepreneurPulse.com.au  Fact Sheet for  Healthcare Providers: IncredibleEmployment.be  This test is not yet approved or cleared by the [place] FDA and has been authorized for detection and/or diagnosis of SARS-CoV-2 by FDA under an Emergency Use Authorization (EUA). This EUA will remain in effect (meaning this test can be used) for the duration of the COVID-19 declaration under Section 564(b)(1) of the Act, 21 U.S.C. section 360bbb-3(b)(1), unless the authorization is terminated or revoked.  Performed at [facility], [place] [address], [place], [place] [address]   Surgical pcr screen     Status: None   Collection Time: 04/29/20 10:38 PM   Specimen: Nasal Mucosa; Nasal Swab  Result Value Ref Range Status   MRSA, PCR NEGATIVE NEGATIVE Final   Staphylococcus aureus NEGATIVE NEGATIVE Final    Comment: (NOTE) The Xpert SA Assay (FDA approved for NASAL specimens in patients 47 years of age and older), is one component of a comprehensive surveillance program. It is not intended to diagnose infection nor to guide or monitor treatment. Performed at [facility],  [address] [place], [place] [address]   Resp Panel by RT-PCR (Flu A&B, Covid) Nasopharyngeal Swab     Status: None   Collection Time: 05/02/20  1:01 PM   Specimen: Nasopharyngeal Swab; Nasopharyngeal(NP) swabs in vial transport medium  Result Value Ref Range Status   SARS Coronavirus 2 by RT PCR NEGATIVE NEGATIVE Final    Comment: (NOTE) SARS-CoV-2 target nucleic acids are NOT DETECTED.  The SARS-CoV-2 RNA is generally detectable in upper respiratory specimens during the acute phase of infection. The lowest concentration of SARS-CoV-2 viral copies this assay can detect is 138 copies/mL. A negative result does not preclude SARS-Cov-2 infection and should not be used as the sole basis for treatment or other patient management decisions. A negative result may occur with  improper specimen collection/handling, submission of  specimen other than nasopharyngeal swab, presence of viral mutation(s) within the areas targeted by this assay, and inadequate number of viral copies(<138 copies/mL). A negative result must be combined with clinical observations, patient history, and epidemiological information. The expected result is Negative.  Fact Sheet for Patients:  EntrepreneurPulse.com.au  Fact Sheet for Healthcare Providers:  IncredibleEmployment.be  This test is no t yet approved or cleared by the [place] FDA and  has been authorized for detection and/or diagnosis of SARS-CoV-2 by FDA under an Emergency Use Authorization (EUA). This EUA will remain  in effect (meaning this test can be used) for the duration of the COVID-19 declaration under Section 564(b)(1) of the Act, 21 U.S.C.section 360bbb-3(b)(1), unless the authorization is terminated  or revoked sooner.       Influenza A by PCR NEGATIVE NEGATIVE Final   Influenza B by PCR NEGATIVE NEGATIVE Final    Comment: (NOTE) The Xpert Xpress SARS-CoV-2/FLU/RSV plus assay is intended as an aid in the diagnosis of influenza from Nasopharyngeal swab specimens and should not be used as a sole basis for treatment. Nasal washings and aspirates are unacceptable for Xpert Xpress SARS-CoV-2/FLU/RSV testing.  Fact Sheet for Patients: EntrepreneurPulse.com.au  Fact Sheet for Healthcare Providers: IncredibleEmployment.be  This test is not yet approved or cleared by the [place] FDA and has been authorized for detection and/or diagnosis of SARS-CoV-2 by FDA under an Emergency Use Authorization (EUA). This EUA will remain in effect (meaning this test can be used) for the duration of the COVID-19 declaration under Section 564(b)(1) of the Act, 21 U.S.C. section 360bbb-3(b)(1), unless the authorization is terminated or revoked.  Performed at [facility], [place] [address]  [place], [place] [address]   Respiratory (~20 pathogens) panel by PCR     Status: None   Collection Time: 05/02/20  1:01 PM   Specimen: Nasopharyngeal Swab; Respiratory  Result Value Ref Range Status   Adenovirus NOT DETECTED NOT DETECTED Final   Coronavirus 229E NOT DETECTED NOT DETECTED Final    Comment: (NOTE) The Coronavirus on the Respiratory Panel, DOES NOT test for the novel  Coronavirus (2019 nCoV)    Coronavirus HKU1 NOT DETECTED NOT DETECTED Final   Coronavirus NL63 NOT DETECTED NOT DETECTED Final   Coronavirus OC43 NOT DETECTED NOT DETECTED Final   Metapneumovirus NOT DETECTED NOT DETECTED Final   Rhinovirus / Enterovirus NOT DETECTED NOT DETECTED Final   Influenza A NOT DETECTED NOT DETECTED Final   Influenza B NOT DETECTED NOT DETECTED Final   Parainfluenza Virus 1 NOT DETECTED NOT DETECTED Final   Parainfluenza Virus 2 NOT DETECTED NOT DETECTED Final   Parainfluenza Virus 3 NOT DETECTED NOT DETECTED Final  Parainfluenza Virus 4 NOT DETECTED NOT DETECTED Final   Respiratory Syncytial Virus NOT DETECTED NOT DETECTED Final   Bordetella pertussis NOT DETECTED NOT DETECTED Final   Bordetella Parapertussis NOT DETECTED NOT DETECTED Final   Chlamydophila pneumoniae NOT DETECTED NOT DETECTED Final   Mycoplasma pneumoniae NOT DETECTED NOT DETECTED Final    Comment: Performed at [facility], [place] [address], [place], [place] [address]  Culture, blood (routine x 2)     Status: None (Preliminary result)   Collection Time: 05/02/20  2:15 PM   Specimen: BLOOD  Result Value Ref Range Status   Specimen Description BLOOD LEFT ANTECUBITAL  Final   Special Requests   Final    BOTTLES DRAWN AEROBIC AND ANAEROBIC Blood Culture results may not be optimal due to an inadequate volume of blood received in culture bottles   Culture   Final    NO GROWTH < 24 HOURS Performed at [facility], [place] [address], [place], [place] [address]    Report Status PENDING  Incomplete  Culture,  blood (routine x 2)     Status: None (Preliminary result)   Collection Time: 05/02/20  2:31 PM   Specimen: BLOOD LEFT HAND  Result Value Ref Range Status   Specimen Description BLOOD LEFT HAND  Final   Special Requests   Final    BOTTLES DRAWN AEROBIC ONLY Blood Culture adequate volume   Culture   Final    NO GROWTH < 24 HOURS Performed at [facility], [place] [address], [place],  [address]    Report Status PENDING  Incomplete    RN Pressure Injury Documentation:     Estimated body mass index is 45.94 kg/m as calculated from the following:   Height as of this encounter: 5' 2.99" (1.6 m).   Weight as of this encounter: 117.6 kg.  Malnutrition Type:   Malnutrition Characteristics:   Nutrition Interventions:   Radiology Studies: CT Code Stroke CTA Head W/WO contrast  Result Date: 05/02/2020 CLINICAL DATA:  70 year old female code stroke presentation. Altered mental status. Recent right femur ORIF. EXAM: CT ANGIOGRAPHY HEAD AND NECK TECHNIQUE: Multidetector CT imaging of the head and neck was performed using the standard protocol during bolus administration of intravenous contrast. Multiplanar CT image reconstructions and MIPs were obtained to evaluate the vascular anatomy. Carotid stenosis measurements (when applicable) are obtained utilizing NASCET criteria, using the distal internal carotid diameter as the denominator. CONTRAST:  145m OMNIPAQUE IOHEXOL 350 MG/ML SOLN COMPARISON:  Plain head CT 1123 hours today. CTA Chest today reported separately. FINDINGS: CTA NECK Skeleton: Bulky disc and endplate degeneration throughout the cervical spine. No acute osseous abnormality identified. Upper chest: Patchy angiographic bilateral upper lung opacity. See comparison. Other neck: Negative thyroid for age. Small volume retained secretions in the pharynx. Paranasal sinuses as described on the head CT today. No other acute finding. Aortic arch: Mild to moderate distal arch  atherosclerosis. Three vessel arch configuration. Right carotid system: Mildly tortuous brachiocephalic artery and proximal right CCA without stenosis. Mild calcified plaque at the posterior right ICA origin without stenosis. Tortuous right ICA. Left carotid system: Tortuous proximal left CCA without stenosis. Mild soft plaque suspected at the level of the thyroid, no stenosis. Similar mild soft plaque at the left carotid bifurcation, left ICA origin. No stenosis. Mildly tortuous left ICA. Vertebral arteries: Tortuous proximal right subclavian artery with mild calcified plaque, no stenosis. Right vertebral artery origin is within normal limits, detail of the right V1  segment is suboptimal. But the right vertebral is patent elsewhere in the neck and to the skull base without stenosis. Mildly tortuous proximal left subclavian artery with mild calcified plaque and no stenosis. Normal left vertebral artery origin. Mildly tortuous left V1 segment. Similar suboptimal vertebral artery detail as on the right, but the left vertebral remains patent to the skull base with no convincing stenosis. CTA HEAD Posterior circulation: Patent vertebral V4 segments without significant stenosis, the right is dominant. Left PICA origin is patent. Right AICA appears dominant and patent. Patent basilar artery with mild irregularity but no stenosis. Patent SCA origins. Fetal type bilateral PCA origins. Bilateral PCA branches are within normal limits. Anterior circulation: Both ICA siphons are patent. Mild for age bilateral siphon calcified plaque. No siphon stenosis. Normal posterior communicating artery origins. Patent carotid termini. Patent MCA and ACA origins. Anterior communicating artery and bilateral ACA branches are within normal limits. Left MCA M1 segment and bifurcation are patent without stenosis. Right MCA M1 segment and bifurcation are patent without stenosis. Bilateral MCA branches are within normal limits. Venous sinuses:  Patent. Anatomic variants: Fetal type bilateral PCA origins. Subsequent diminutive vertebrobasilar system. Review of the MIP images confirms the above findings IMPRESSION: 1. Negative for large vessel occlusion. 2. 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 No hemodynamically significant arterial stenosis identified. 3. Patchy bilateral upper lung opacity suggestive of acute viral/atypical respiratory infection. See CTA chest reported separately. These results were communicated to Dr. Rory Percy at 12:01 pm on 05/02/2020 by text page via the [facility] messaging system. Electronically Signed   By: Genevie Ann M.D.   On: 05/02/2020 12:02   CT Code Stroke CTA Neck W/WO contrast  Result Date: 05/02/2020 CLINICAL DATA:  70 year old female code stroke presentation. Altered mental status. Recent right femur ORIF. EXAM: CT ANGIOGRAPHY HEAD AND NECK TECHNIQUE: Multidetector CT imaging of the head and neck was performed using the standard protocol during bolus administration of intravenous contrast. Multiplanar CT image reconstructions and MIPs were obtained to evaluate the vascular anatomy. Carotid stenosis measurements (when applicable) are obtained utilizing NASCET criteria, using the distal internal carotid diameter as the denominator. CONTRAST:  152m OMNIPAQUE IOHEXOL 350 MG/ML SOLN COMPARISON:  Plain head CT 1123 hours today. CTA Chest today reported separately. FINDINGS: CTA NECK Skeleton: Bulky disc and endplate degeneration throughout the cervical spine. No acute osseous abnormality identified. Upper chest: Patchy angiographic bilateral upper lung opacity. See comparison. Other neck: Negative thyroid for age. Small volume retained secretions in the pharynx. Paranasal sinuses as described on the head CT today. No other acute finding. Aortic arch: Mild to moderate distal arch atherosclerosis. Three vessel arch configuration. Right carotid system: Mildly tortuous brachiocephalic artery and proximal right CCA without  stenosis. Mild calcified plaque at the posterior right ICA origin without stenosis. Tortuous right ICA. Left carotid system: Tortuous proximal left CCA without stenosis. Mild soft plaque suspected at the level of the thyroid, no stenosis. Similar mild soft plaque at the left carotid bifurcation, left ICA origin. No stenosis. Mildly tortuous left ICA. Vertebral arteries: Tortuous proximal right subclavian artery with mild calcified plaque, no stenosis. Right vertebral artery origin is within normal limits, detail of the right V1 segment is suboptimal. But the right vertebral is patent elsewhere in the neck and to the skull base without stenosis. Mildly tortuous proximal left subclavian artery with mild calcified plaque and no stenosis. Normal left vertebral artery origin. Mildly tortuous left V1 segment. Similar suboptimal vertebral artery detail as on the  right, but the left vertebral remains patent to the skull base with no convincing stenosis. CTA HEAD Posterior circulation: Patent vertebral V4 segments without significant stenosis, the right is dominant. Left PICA origin is patent. Right AICA appears dominant and patent. Patent basilar artery with mild irregularity but no stenosis. Patent SCA origins. Fetal type bilateral PCA origins. Bilateral PCA branches are within normal limits. Anterior circulation: Both ICA siphons are patent. Mild for age bilateral siphon calcified plaque. No siphon stenosis. Normal posterior communicating artery origins. Patent carotid termini. Patent MCA and ACA origins. Anterior communicating artery and bilateral ACA branches are within normal limits. Left MCA M1 segment and bifurcation are patent without stenosis. Right MCA M1 segment and bifurcation are patent without stenosis. Bilateral MCA branches are within normal limits. Venous sinuses: Patent. Anatomic variants: Fetal type bilateral PCA origins. Subsequent diminutive vertebrobasilar system. Review of the MIP images confirms the  above findings IMPRESSION: 1. Negative for large vessel occlusion. 2. 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 No hemodynamically significant arterial stenosis identified. 3. Patchy bilateral upper lung opacity suggestive of acute viral/atypical respiratory infection. See CTA chest reported separately. These results were communicated to Dr. Rory Percy at 12:01 pm on 05/02/2020 by text page via the [facility] messaging system. Electronically Signed   By: Genevie Ann M.D.   On: 05/02/2020 12:02   CT ANGIO CHEST PE W OR WO CONTRAST  Result Date: 05/02/2020 CLINICAL DATA:  High probability of pulmonary embolus. EXAM: CT ANGIOGRAPHY CHEST WITH CONTRAST TECHNIQUE: Multidetector CT imaging of the chest was performed using the standard protocol during bolus administration of intravenous contrast. Multiplanar CT image reconstructions and MIPs were obtained to evaluate the vascular anatomy. CONTRAST:  116m OMNIPAQUE IOHEXOL 350 MG/ML SOLN COMPARISON:  August 15, 2019. FINDINGS: Cardiovascular: There is no evidence of large central pulmonary embolus seen in the main pulmonary artery or main portions of the left and right pulmonary arteries. Evaluation of the lower lobe branches of both pulmonary arteries is limited due to respiratory motion artifact and attenuation due to body habitus. Smaller and more peripheral pulmonary emboli cannot be excluded on the basis of this exam. Normal cardiac size. No pericardial effusion. No evidence of thoracic aortic dissection or aneurysm. Mediastinum/Nodes: No enlarged mediastinal, hilar, or axillary lymph nodes. Thyroid gland, trachea, and esophagus demonstrate no significant findings. Lungs/Pleura: No pneumothorax or pleural effusion is noted. Patchy airspace opacities are noted throughout both lungs which may represent multifocal pneumonia. Upper Abdomen: No acute abnormality. Musculoskeletal: No chest wall abnormality. No acute or significant osseous findings. Review of the MIP images  confirms the above findings. IMPRESSION: 1. There is no evidence of large central pulmonary embolus seen in the main pulmonary artery or main portions of the left and right pulmonary arteries. Evaluation of the lower lobe branches of both pulmonary arteries is limited due to respiratory motion artifact and attenuation due to body habitus. Smaller and more peripheral pulmonary emboli cannot be excluded on the basis of this exam. 2. Patchy airspace opacities are noted throughout both lungs which may represent multifocal pneumonia. Electronically Signed   By: JMarijo ConceptionM.D.   On: 05/02/2020 12:09   DG CHEST PORT 1 VIEW  Result Date: 05/03/2020 CLINICAL DATA:  70year old female with shortness of breath. Confusion, altered mental status. Recent hip fracture, surgery. Negative for COVID-19 yesterday and on 04/29/2020. EXAM: PORTABLE CHEST 1 VIEW COMPARISON:  CTA chest yesterday and earlier. FINDINGS: Portable AP semi upright view at 0933 hours. New since  portable chest x-ray 04/29/2020 multifocal bilateral patchy and indistinct pulmonary opacities. Lung apices appear relatively spared. Both lower lungs affected. Mildly lower lung volumes. Stable cardiac size and mediastinal contours. Visualized tracheal air column is within normal limits. No pneumothorax or pleural effusion. No air bronchograms. No acute osseous abnormality identified. IMPRESSION: Multifocal bilateral patchy and indistinct pulmonary opacity is new compared to 04/29/2020, and compatible with Acute Pneumonia superimposed on chronic interstitial lung disease (which was demonstrated on Chest CTs 2021 and earlier). Electronically Signed   By: Genevie Ann M.D.   On: 05/03/2020 09:43   CT HEAD CODE STROKE WO CONTRAST  Result Date: 05/02/2020 CLINICAL DATA:  Code stroke. 70 year old female altered mental status. EXAM: CT HEAD WITHOUT CONTRAST TECHNIQUE: Contiguous axial images were obtained from the base of the skull through the vertex without  intravenous contrast. COMPARISON:  Head CT 11/09/2019. FINDINGS: Brain: Stable dystrophic calcifications bilateral basal ganglia. No midline shift, ventriculomegaly, mass effect, evidence of mass lesion, intracranial hemorrhage or evidence of cortically based acute infarction. Gray-white matter differentiation is within normal limits throughout the brain. Vascular: Calcified atherosclerosis at the skull base. No suspicious intracranial vascular hyperdensity. Skull: No acute osseous abnormality identified. Sinuses/Orbits: Generalized mild to moderate paranasal sinus mucosal thickening is new. No sinus fluid levels. Tympanic cavities and mastoids remain clear. Other: Visualized orbits and scalp soft tissues are within normal limits. ASPECTS [facility] Stroke Program Early CT Score) Total score (0-10 with 10 being normal): 10 IMPRESSION: 1. Stable and normal for age noncontrast CT appearance of the brain. ASPECTS 10. 2. New generalized paranasal sinus inflammation. 3. These results were communicated to Dr. Rory Percy at 11:35 am on 05/02/2020 by text page via the [facility] messaging system. Electronically Signed   By: Genevie Ann M.D.   On: 05/02/2020 11:36   Scheduled Meds: . sodium chloride   Intravenous Once  . sodium chloride   Intravenous Once  . aspirin EC  81 mg Oral Daily  . diphenhydrAMINE  25 mg Intravenous Once  . docusate sodium  100 mg Oral BID  . famotidine  40 mg Oral Daily  . insulin aspart  0-9 Units Subcutaneous TID WC  . ipratropium  0.5 mg Nebulization TID  . levalbuterol  0.63 mg Nebulization TID  . metoprolol succinate  75 mg Oral Daily  . metoprolol tartrate  5 mg Intravenous Q8H  . montelukast  10 mg Oral QHS  . oxybutynin  5 mg Oral TID  . pantoprazole  40 mg Oral Daily  . potassium & sodium phosphates  1 packet Oral TID WC & HS  . pravastatin  40 mg Oral QHS  . pregabalin  100 mg Oral QHS  . Ensure Max Protein  11 oz Oral TID  . senna-docusate  1 tablet Oral Q0600   Continuous  Infusions: . sodium chloride    . ceFEPime (MAXIPIME) IV 2 g (05/03/20 1510)  . methocarbamol (ROBAXIN) IV    . sodium chloride    . vancomycin      LOS: 4 days   Kerney Elbe, DO [facility] PAGER is on [place]  If 7PM-7AM, please contact night-coverage [email or web address]

## 2020-05-03 NOTE — TOC Progression Note (Addendum)
Transition of Care [facility]) - Progression Note    Patient Details  Name: Kiyoko Mcguirt MRN: [id number] Date of Birth: [birth year]  Transition of Care [facility]) CM/SW [place], [place] Phone Number: 05/03/2020, 12:23 PM  Clinical Narrative:     Update 1/17 3:41pm- Juliann Pulse with [facility] will confirmed if they can accept patient for SNF placement in the am.   Pending SNF bed with [facility] Once confirmed CSW will need to let patients insurance know SNF choice. Also fax most recent progress note from 1/17 to patients insurance.  Update 1/17 3:35pm-CSW spoke with patients daughter and gave CSW bed offers. Patients daughter accepted SNF bed offer with [facility]. CSW called Juliann Pulse with [facility] and she is going to call CSW back and confirm if they can accept patient for SNF placement.  Pending SNF bed offer with [facility]. CSW will need to call patients insurance and update them with SNF choice once bed offer is confirmed.  CSW will continue to follow.   CSW started insurance authorization for patient. Requested start date is for tomorrow 1/18. Reference number is #[id number].  Pending SNF bed offers. Pending insurance authorization.  CSW will continue to follow.   Expected Discharge Plan: Skilled Nursing Facility Barriers to Discharge: Continued Medical Work up  Expected Discharge Plan and Services Expected Discharge Plan: [place] arrangements for the past 2 months: Single Family Home                                       Social Determinants of Health (SDOH) Interventions    Readmission Risk Interventions Readmission Risk Prevention Plan 08/12/2019  Transportation Screening Complete  PCP or Specialist Appt within 3-5 Days Complete  HRI or [place] Complete  Social Work Consult for [place] Planning/Counseling Complete  Palliative Care Screening Not Applicable  Medication Review Press photographer) Complete  Some recent data might be hidden

## 2020-05-04 ENCOUNTER — Inpatient Hospital Stay (HOSPITAL_COMMUNITY): Payer: 59

## 2020-05-04 DIAGNOSIS — C9210 Chronic myeloid leukemia, BCR/ABL-positive, not having achieved remission: Secondary | ICD-10-CM | POA: Diagnosis not present

## 2020-05-04 DIAGNOSIS — W19XXXA Unspecified fall, initial encounter: Secondary | ICD-10-CM | POA: Diagnosis not present

## 2020-05-04 DIAGNOSIS — D696 Thrombocytopenia, unspecified: Secondary | ICD-10-CM

## 2020-05-04 DIAGNOSIS — I5032 Chronic diastolic (congestive) heart failure: Secondary | ICD-10-CM

## 2020-05-04 DIAGNOSIS — S72141A Displaced intertrochanteric fracture of right femur, initial encounter for closed fracture: Secondary | ICD-10-CM | POA: Diagnosis not present

## 2020-05-04 DIAGNOSIS — R609 Edema, unspecified: Secondary | ICD-10-CM | POA: Diagnosis not present

## 2020-05-04 DIAGNOSIS — R000 Tachycardia, unspecified: Secondary | ICD-10-CM

## 2020-05-04 LAB — CBC WITH DIFFERENTIAL/PLATELET
Abs Immature Granulocytes: 2.01 10*3/uL — ABNORMAL HIGH (ref 0.00–0.07)
Basophils Absolute: 0.1 10*3/uL (ref 0.0–0.1)
Basophils Relative: 1 %
Eosinophils Absolute: 0 10*3/uL (ref 0.0–0.5)
Eosinophils Relative: 0 %
HCT: 23.6 % — ABNORMAL LOW (ref 36.0–46.0)
Hemoglobin: 7.5 g/dL — ABNORMAL LOW (ref 12.0–15.0)
Immature Granulocytes: 11 %
Lymphocytes Relative: 10 %
Lymphs Abs: 1.8 10*3/uL (ref 0.7–4.0)
MCH: 29.9 pg (ref 26.0–34.0)
MCHC: 31.8 g/dL (ref 30.0–36.0)
MCV: 94 fL (ref 80.0–100.0)
Monocytes Absolute: 3.4 10*3/uL — ABNORMAL HIGH (ref 0.1–1.0)
Monocytes Relative: 20 %
Neutro Abs: 10.3 10*3/uL — ABNORMAL HIGH (ref 1.7–7.7)
Neutrophils Relative %: 58 %
Platelets: 77 10*3/uL — ABNORMAL LOW (ref 150–400)
RBC: 2.51 MIL/uL — ABNORMAL LOW (ref 3.87–5.11)
RDW: 21 % — ABNORMAL HIGH (ref 11.5–15.5)
WBC: 17.6 10*3/uL — ABNORMAL HIGH (ref 4.0–10.5)
nRBC: 4.1 % — ABNORMAL HIGH (ref 0.0–0.2)

## 2020-05-04 LAB — COMPREHENSIVE METABOLIC PANEL
ALT: 15 U/L (ref 0–44)
AST: 37 U/L (ref 15–41)
Albumin: 2.2 g/dL — ABNORMAL LOW (ref 3.5–5.0)
Alkaline Phosphatase: 46 U/L (ref 38–126)
Anion gap: 8 (ref 5–15)
BUN: 10 mg/dL (ref 8–23)
CO2: 22 mmol/L (ref 22–32)
Calcium: 8.5 mg/dL — ABNORMAL LOW (ref 8.9–10.3)
Chloride: 107 mmol/L (ref 98–111)
Creatinine, Ser: 0.52 mg/dL (ref 0.44–1.00)
GFR, Estimated: >60 mL/min (ref >60)
Glucose, Bld: 168 mg/dL — ABNORMAL HIGH (ref 70–99)
Potassium: 4.2 mmol/L (ref 3.5–5.1)
Sodium: 137 mmol/L (ref 135–145)
Total Bilirubin: 1.1 mg/dL (ref 0.3–1.2)
Total Protein: 5.4 g/dL — ABNORMAL LOW (ref 6.5–8.1)

## 2020-05-04 LAB — GLUCOSE, CAPILLARY
Glucose-Capillary: 133 mg/dL — ABNORMAL HIGH (ref 70–99)
Glucose-Capillary: 161 mg/dL — ABNORMAL HIGH (ref 70–99)
Glucose-Capillary: 168 mg/dL — ABNORMAL HIGH (ref 70–99)

## 2020-05-04 LAB — URINALYSIS, ROUTINE W REFLEX MICROSCOPIC
Bilirubin Urine: NEGATIVE
Glucose, UA: NEGATIVE mg/dL
Hgb urine dipstick: NEGATIVE
Ketones, ur: NEGATIVE mg/dL
Leukocytes,Ua: NEGATIVE
Nitrite: NEGATIVE
Protein, ur: NEGATIVE mg/dL
Specific Gravity, Urine: 1.014 (ref 1.005–1.030)
pH: 7 (ref 5.0–8.0)

## 2020-05-04 LAB — PHOSPHORUS: Phosphorus: 2.4 mg/dL — ABNORMAL LOW (ref 2.5–4.6)

## 2020-05-04 LAB — PROCALCITONIN: Procalcitonin: 1.05 ng/mL

## 2020-05-04 LAB — MAGNESIUM: Magnesium: 1.7 mg/dL (ref 1.7–2.4)

## 2020-05-04 MED ORDER — HYDROCODONE-ACETAMINOPHEN 5-325 MG PO TABS: 1.0000 | ORAL_TABLET | Freq: Four times a day (QID) | ORAL | Status: DC | PRN

## 2020-05-04 MED ORDER — KETOROLAC TROMETHAMINE 15 MG/ML IJ SOLN
15.0000 mg | Freq: Four times a day (QID) | INTRAMUSCULAR | Status: DC | PRN
  Administered 2020-05-04 – 2020-05-07 (×5): 15 mg via INTRAVENOUS
  Filled 2020-05-04 (×5): qty 1

## 2020-05-04 MED ORDER — LEVALBUTEROL HCL 0.63 MG/3ML IN NEBU
0.6300 mg | INHALATION_SOLUTION | Freq: Two times a day (BID) | RESPIRATORY_TRACT | Status: DC
  Administered 2020-05-04 – 2020-05-05 (×3): 0.63 mg via RESPIRATORY_TRACT
  Filled 2020-05-04 (×3): qty 3

## 2020-05-04 MED ORDER — TRAMADOL HCL 50 MG PO TABS: 50.0000 mg | ORAL_TABLET | Freq: Two times a day (BID) | ORAL | Status: DC | PRN

## 2020-05-04 MED ORDER — LORAZEPAM 2 MG/ML IJ SOLN
1.0000 mg | Freq: Once | INTRAMUSCULAR | Status: AC
  Administered 2020-05-04: 1 mg via INTRAVENOUS
  Filled 2020-05-04: qty 1

## 2020-05-04 MED ORDER — IPRATROPIUM BROMIDE 0.02 % IN SOLN
0.5000 mg | Freq: Two times a day (BID) | RESPIRATORY_TRACT | Status: DC
  Administered 2020-05-04 – 2020-05-05 (×3): 0.5 mg via RESPIRATORY_TRACT
  Filled 2020-05-04 (×3): qty 2.5

## 2020-05-04 NOTE — Progress Notes (Signed)
Orthopaedic Trauma Progress Note  S: Reviewed events from last 48 hrs. Daughter is at bedside. States still confused. Concerned pain meds is causing confusion. Persistently tachycardic.   O:  Vitals:   05/04/20 0327 05/04/20 0847  BP: (!) 120/59 125/73  Pulse: (!) 108 (!) 108  Resp: (!) 24 (!) 24  Temp: 97.9 F (36.6 C) 98.8 F (37.1 C)  SpO2: 99% 97%   Awake, oriented to person but not time RLE: Swelling is present. Incisions intact and dry, dressings left in place, slight amount of drainage on dressing. Neurovascularly intact  Imaging: Stable postop imaging. Reviewed CTA  Labs:  Results for orders placed or performed during the hospital encounter of 04/29/20 (from the past 24 hour(s))  Troponin I (High Sensitivity)     Status: Abnormal   Collection Time: 05/03/20 11:14 AM  Result Value Ref Range   Troponin I (High Sensitivity) 54 (H) <18 ng/L  Hepatic function panel     Status: Abnormal   Collection Time: 05/03/20 11:14 AM  Result Value Ref Range   Total Protein 5.5 (L) 6.5 - 8.1 g/dL   Albumin 2.3 (L) 3.5 - 5.0 g/dL   AST 44 (H) 15 - 41 U/L   ALT 12 0 - 44 U/L   Alkaline Phosphatase 46 38 - 126 U/L   Total Bilirubin 1.7 (H) 0.3 - 1.2 mg/dL   Bilirubin, Direct 0.5 (H) 0.0 - 0.2 mg/dL   Indirect Bilirubin 1.2 (H) 0.3 - 0.9 mg/dL  Magnesium     Status: None   Collection Time: 05/03/20 11:14 AM  Result Value Ref Range   Magnesium 2.0 1.7 - 2.4 mg/dL  Phosphorus     Status: Abnormal   Collection Time: 05/03/20 11:14 AM  Result Value Ref Range   Phosphorus 1.4 (L) 2.5 - 4.6 mg/dL  Glucose, capillary     Status: Abnormal   Collection Time: 05/03/20 12:12 PM  Result Value Ref Range   Glucose-Capillary 152 (H) 70 - 99 mg/dL  Troponin I (High Sensitivity)     Status: Abnormal   Collection Time: 05/03/20  2:49 PM  Result Value Ref Range   Troponin I (High Sensitivity) 65 (H) <18 ng/L  Glucose, capillary     Status: Abnormal   Collection Time: 05/03/20 10:39 PM  Result  Value Ref Range   Glucose-Capillary 148 (H) 70 - 99 mg/dL  Urinalysis, Routine w reflex microscopic Urine, Clean Catch     Status: None   Collection Time: 05/04/20 12:50 AM  Result Value Ref Range   Color, Urine YELLOW YELLOW   APPearance CLEAR CLEAR   Specific Gravity, Urine 1.014 1.005 - 1.030   pH 7.0 5.0 - 8.0   Glucose, UA NEGATIVE NEGATIVE mg/dL   Hgb urine dipstick NEGATIVE NEGATIVE   Bilirubin Urine NEGATIVE NEGATIVE   Ketones, ur NEGATIVE NEGATIVE mg/dL   Protein, ur NEGATIVE NEGATIVE mg/dL   Nitrite NEGATIVE NEGATIVE   Leukocytes,Ua NEGATIVE NEGATIVE  Procalcitonin     Status: None   Collection Time: 05/04/20  6:39 AM  Result Value Ref Range   Procalcitonin 1.05 ng/mL  CBC with Differential/Platelet     Status: Abnormal   Collection Time: 05/04/20  6:39 AM  Result Value Ref Range   WBC 17.6 (H) 4.0 - 10.5 K/uL   RBC 2.51 (L) 3.87 - 5.11 MIL/uL   Hemoglobin 7.5 (L) 12.0 - 15.0 g/dL   HCT 23.6 (L) 36.0 - 46.0 %   MCV 94.0 80.0 - 100.0 fL  MCH 29.9 26.0 - 34.0 pg   MCHC 31.8 30.0 - 36.0 g/dL   RDW 21.0 (H) 11.5 - 15.5 %   Platelets 77 (L) 150 - 400 K/uL   nRBC 4.1 (H) 0.0 - 0.2 %   Neutrophils Relative % 58 %   Neutro Abs 10.3 (H) 1.7 - 7.7 K/uL   Lymphocytes Relative 10 %   Lymphs Abs 1.8 0.7 - 4.0 K/uL   Monocytes Relative 20 %   Monocytes Absolute 3.4 (H) 0.1 - 1.0 K/uL   Eosinophils Relative 0 %   Eosinophils Absolute 0.0 0.0 - 0.5 K/uL   Basophils Relative 1 %   Basophils Absolute 0.1 0.0 - 0.1 K/uL   Immature Granulocytes 11 %   Abs Immature Granulocytes 2.01 (H) 0.00 - 0.07 K/uL   Polychromasia PRESENT   Comprehensive metabolic panel     Status: Abnormal   Collection Time: 05/04/20  6:39 AM  Result Value Ref Range   Sodium 137 135 - 145 mmol/L   Potassium 4.2 3.5 - 5.1 mmol/L   Chloride 107 98 - 111 mmol/L   CO2 22 22 - 32 mmol/L   Glucose, Bld 168 (H) 70 - 99 mg/dL   BUN 10 8 - 23 mg/dL   Creatinine, Ser 0.52 0.44 - 1.00 mg/dL   Calcium 8.5 (L)  8.9 - 10.3 mg/dL   Total Protein 5.4 (L) 6.5 - 8.1 g/dL   Albumin 2.2 (L) 3.5 - 5.0 g/dL   AST 37 15 - 41 U/L   ALT 15 0 - 44 U/L   Alkaline Phosphatase 46 38 - 126 U/L   Total Bilirubin 1.1 0.3 - 1.2 mg/dL   GFR, Estimated >60 >60 mL/min   Anion gap 8 5 - 15  Magnesium     Status: None   Collection Time: 05/04/20  6:39 AM  Result Value Ref Range   Magnesium 1.7 1.7 - 2.4 mg/dL  Phosphorus     Status: Abnormal   Collection Time: 05/04/20  6:39 AM  Result Value Ref Range   Phosphorus 2.4 (L) 2.5 - 4.6 mg/dL  Glucose, capillary     Status: Abnormal   Collection Time: 05/04/20  8:00 AM  Result Value Ref Range   Glucose-Capillary 133 (H) 70 - 99 mg/dL    Assessment: 70 year old female s/p fall w/ left hip fracture s/p IMN on 04/30/20   Weightbearing: WBAT RLE  Insicional and dressing care: Dressing changes PRN  Orthopedic device(s):None needed  CV/Blood loss:Currently stable today after 3 units PRBCs and 3 units platelets in total  Pain management: Discontinued all narcotics and lyrica to see if contributing to mental status. Have tylenol for pain, toradol IV PRN (have to be judicial with this due to AKI-Cr this AM is 0.52) and tramadol for pain q12 hrs. Avoid narcotics for now.  VTE prophylaxis: Currently on 81 mg ASA for VTE prophylaxis. Have not started anything more aggressive due to continued anemia. CTA neg for embolism but doppler of RLE is appropriate in setting of confusion and persistent tachycardia to rule out DVT and possible PE  ID: per hospitalist. ON Vanc and cefepime now  Medical co-morbidities: per hospitalist  Dispo: PT/OT when able, likely will need SNF  Follow - up plan: Continue to follow in hospital. Will need outpatient follow up in 2-3 weeks post discharge   Shona Needles, MD [facility] [phone] (phone)

## 2020-05-04 NOTE — TOC Progression Note (Addendum)
Transition of Care [facility]) - Progression Note    Patient Details  Name: Kathy Irwin MRN: [id number] Date of Birth: 1950/11/20  Transition of Care [facility]) CM/SW [place], [place] Phone Number: 05/04/2020, 11:27 AM  Clinical Narrative:     Update 1/18 5:15pm- Juliann Pulse with [facility] asked CSW to call back in the morning to see if she can offer SNF bed for patient. CSW will follow up with Juliann Pulse with [facility] in the am. CSW will need to start insurance auth close to patient being medically ready.    CSW followed up with Juliann Pulse with [facility] to see if they can offer SNF bed for patient. Juliann Pulse told CSW to follow up with her this afternoon . CSW cancelled insurance authorization. Will restart once patient is closer to being medically ready for SNF placement.  Pending SN[facility] bed offer with [facility]. CSW will continue to follow.   Expected Discharge Plan: Skilled Nursing Facility Barriers to Discharge: Continued Medical Work up  Expected Discharge Plan and Services Expected Discharge Plan: [place] arrangements for the past 2 months: Single Family Home                                       Social Determinants of Health (SDOH) Interventions    Readmission Risk Interventions Readmission Risk Prevention Plan 08/12/2019  Transportation Screening Complete  PCP or Specialist Appt within 3-5 Days Complete  HRI or [place] Complete  Social Work Consult for [place] Planning/Counseling Complete  Palliative Care Screening Not Applicable  Medication Review Press photographer) Complete  Some recent data might be hidden

## 2020-05-04 NOTE — Progress Notes (Addendum)
HEMATOLOGY-ONCOLOGY PROGRESS NOTE  SUBJECTIVE: The patient is somewhat sedated today.  She received pain medication just while prior to my visit.  Her daughter is at the bedside.  She reports that her pain is overall well controlled except for when she works with physical therapy.  No bleeding has been reported.  PCCM is following due to concern for pneumonia.  She is on antibiotics.  REVIEW OF SYSTEMS:   Constitutional: No fevers documented in the past 24 hours Eyes: Denies blurriness of vision Ears, nose, mouth, throat, and face: Denies mucositis or sore throat Respiratory: Denies cough, dyspnea or wheezes Cardiovascular: Denies palpitation, chest discomfort Gastrointestinal:  Denies nausea, heartburn or change in bowel habits Skin: Denies abnormal skin rashes Lymphatics: Denies new lymphadenopathy or easy bruising Neurological:Denies numbness, tingling or new weaknesses Behavioral/Psych: Mood is stable, no new changes  Extremities: No lower extremity edema All other systems were reviewed with the patient and are negative.  I have reviewed the past medical history, past surgical history, social history and family history with the patient and they are unchanged from previous note.   PHYSICAL EXAMINATION: ECOG PERFORMANCE STATUS: 2 - Symptomatic, <50% confined to bed  Vitals:   05/04/20 0847 05/04/20 1201  BP: 125/73 102/82  Pulse: (!) 108 (!) 102  Resp: (!) 24 18  Temp: 98.8 F (37.1 C) 98.3 F (36.8 C)  SpO2: 97%    Filed Weights   05/01/20 0425 05/02/20 0644 05/04/20 0327  Weight: 118.2 kg 117.6 kg 118.4 kg    Intake/Output from previous day: 01/17 0701 - 01/18 0700 In: 0  Out: 1150 [Urine:1150]  GENERAL: Sedated secondary to pain medication but able to awaken and answer questions SKIN: skin color, texture, turgor are normal, no rashes or significant lesions LUNGS: Diminished breath sounds HEART: Tachycardic ABDOMEN:abdomen soft, non-tender and normal bowel  sounds NEURO: The patient is drowsy secondary to pain medication but arouses to answer questions  LABORATORY DATA:  I have reviewed the data as listed CMP Latest Ref Rng & Units 05/04/2020 05/03/2020 05/02/2020  Glucose 70 - 99 mg/dL 168(H) 165(H) 194(H)  BUN 8 - 23 mg/dL _0 Creatinine 0.44 - 1.00 mg/dL 0.52 0.68 0.71  Sodium 135 - 145 mmol/L 137 142 137  Potassium 3.5 - 5.1 mmol/L 4.2 4.6 4.6  Chloride 98 - 111 mmol/L 107 113(H) 106  CO2 22 - 32 mmol/L 22 21(L) 24  Calcium 8.9 - 10.3 mg/dL 8.5(L) 8.6(L) 8.6(L)  Total Protein 6.5 - 8.1 g/dL 5.4(L) 5.5(L) 5.3(L)  Total Bilirubin 0.3 - 1.2 mg/dL 1.1 1.7(H) 1.2  Alkaline Phos 38 - 126 U/L 46 46 43  AST 15 - 41 U/L 37 44(H) 34  ALT 0 - 44 U/L _1 Lab Results  Component Value Date   WBC 17.6 (H) 05/04/2020   HGB 7.5 (L) 05/04/2020   HCT 23.6 (L) 05/04/2020   MCV 94.0 05/04/2020   PLT 77 (L) 05/04/2020   NEUTROABS 10.3 (H) 05/04/2020    CT Code Stroke CTA Head W/WO contrast  Result Date: 05/02/2020 CLINICAL DATA:  70 year old female code stroke presentation. Altered mental status. Recent right femur ORIF. EXAM: CT ANGIOGRAPHY HEAD AND NECK TECHNIQUE: Multidetector CT imaging of the head and neck was performed using the standard protocol during bolus administration of intravenous contrast. Multiplanar CT image reconstructions and MIPs were obtained to evaluate the vascular anatomy. Carotid stenosis measurements (when applicable) are obtained utilizing NASCET criteria, using the distal internal carotid diameter as  the denominator. CONTRAST:  158m OMNIPAQUE IOHEXOL 350 MG/ML SOLN COMPARISON:  Plain head CT 1123 hours today. CTA Chest today reported separately. FINDINGS: CTA NECK Skeleton: Bulky disc and endplate degeneration throughout the cervical spine. No acute osseous abnormality identified. Upper chest: Patchy angiographic bilateral upper lung opacity. See comparison. Other neck: Negative thyroid for age. Small volume  retained secretions in the pharynx. Paranasal sinuses as described on the head CT today. No other acute finding. Aortic arch: Mild to moderate distal arch atherosclerosis. Three vessel arch configuration. Right carotid system: Mildly tortuous brachiocephalic artery and proximal right CCA without stenosis. Mild calcified plaque at the posterior right ICA origin without stenosis. Tortuous right ICA. Left carotid system: Tortuous proximal left CCA without stenosis. Mild soft plaque suspected at the level of the thyroid, no stenosis. Similar mild soft plaque at the left carotid bifurcation, left ICA origin. No stenosis. Mildly tortuous left ICA. Vertebral arteries: Tortuous proximal right subclavian artery with mild calcified plaque, no stenosis. Right vertebral artery origin is within normal limits, detail of the right V1 segment is suboptimal. But the right vertebral is patent elsewhere in the neck and to the skull base without stenosis. Mildly tortuous proximal left subclavian artery with mild calcified plaque and no stenosis. Normal left vertebral artery origin. Mildly tortuous left V1 segment. Similar suboptimal vertebral artery detail as on the right, but the left vertebral remains patent to the skull base with no convincing stenosis. CTA HEAD Posterior circulation: Patent vertebral V4 segments without significant stenosis, the right is dominant. Left PICA origin is patent. Right AICA appears dominant and patent. Patent basilar artery with mild irregularity but no stenosis. Patent SCA origins. Fetal type bilateral PCA origins. Bilateral PCA branches are within normal limits. Anterior circulation: Both ICA siphons are patent. Mild for age bilateral siphon calcified plaque. No siphon stenosis. Normal posterior communicating artery origins. Patent carotid termini. Patent MCA and ACA origins. Anterior communicating artery and bilateral ACA branches are within normal limits. Left MCA M1 segment and bifurcation are  patent without stenosis. Right MCA M1 segment and bifurcation are patent without stenosis. Bilateral MCA branches are within normal limits. Venous sinuses: Patent. Anatomic variants: Fetal type bilateral PCA origins. Subsequent diminutive vertebrobasilar system. Review of the MIP images confirms the above findings IMPRESSION: 1. Negative for large vessel occlusion. 2. 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 No hemodynamically significant arterial stenosis identified. 3. Patchy bilateral upper lung opacity suggestive of acute viral/atypical respiratory infection. See CTA chest reported separately. These results were communicated to Dr. ARory Percyat 12:01 pm on 05/02/2020 by text page via the [facility] system. Electronically Signed   By: HGenevie AnnM.D.   On: 05/02/2020 12:02   DG Chest 1 View  Result Date: 04/29/2020 CLINICAL DATA:  Pain following fall EXAM: CHEST  1 VIEW COMPARISON:  October 15, 2019 FINDINGS: Lungs are clear. Heart size and pulmonary vascularity are normal. No pneumothorax. No adenopathy. No bone lesions. IMPRESSION: Lungs clear.  Cardiac silhouette normal. Electronically Signed   By: WLowella GripIII M.D.   On: 04/29/2020 12:03   CT Code Stroke CTA Neck W/WO contrast  Result Date: 05/02/2020 CLINICAL DATA:  70year old female code stroke presentation. Altered mental status. Recent right femur ORIF. EXAM: CT ANGIOGRAPHY HEAD AND NECK TECHNIQUE: Multidetector CT imaging of the head and neck was performed using the standard protocol during bolus administration of intravenous contrast. Multiplanar CT image reconstructions and MIPs were obtained to evaluate the vascular anatomy. Carotid stenosis  measurements (when applicable) are obtained utilizing NASCET criteria, using the distal internal carotid diameter as the denominator. CONTRAST:  147m OMNIPAQUE IOHEXOL 350 MG/ML SOLN COMPARISON:  Plain head CT 1123 hours today. CTA Chest today reported separately. FINDINGS: CTA NECK  Skeleton: Bulky disc and endplate degeneration throughout the cervical spine. No acute osseous abnormality identified. Upper chest: Patchy angiographic bilateral upper lung opacity. See comparison. Other neck: Negative thyroid for age. Small volume retained secretions in the pharynx. Paranasal sinuses as described on the head CT today. No other acute finding. Aortic arch: Mild to moderate distal arch atherosclerosis. Three vessel arch configuration. Right carotid system: Mildly tortuous brachiocephalic artery and proximal right CCA without stenosis. Mild calcified plaque at the posterior right ICA origin without stenosis. Tortuous right ICA. Left carotid system: Tortuous proximal left CCA without stenosis. Mild soft plaque suspected at the level of the thyroid, no stenosis. Similar mild soft plaque at the left carotid bifurcation, left ICA origin. No stenosis. Mildly tortuous left ICA. Vertebral arteries: Tortuous proximal right subclavian artery with mild calcified plaque, no stenosis. Right vertebral artery origin is within normal limits, detail of the right V1 segment is suboptimal. But the right vertebral is patent elsewhere in the neck and to the skull base without stenosis. Mildly tortuous proximal left subclavian artery with mild calcified plaque and no stenosis. Normal left vertebral artery origin. Mildly tortuous left V1 segment. Similar suboptimal vertebral artery detail as on the right, but the left vertebral remains patent to the skull base with no convincing stenosis. CTA HEAD Posterior circulation: Patent vertebral V4 segments without significant stenosis, the right is dominant. Left PICA origin is patent. Right AICA appears dominant and patent. Patent basilar artery with mild irregularity but no stenosis. Patent SCA origins. Fetal type bilateral PCA origins. Bilateral PCA branches are within normal limits. Anterior circulation: Both ICA siphons are patent. Mild for age bilateral siphon calcified  plaque. No siphon stenosis. Normal posterior communicating artery origins. Patent carotid termini. Patent MCA and ACA origins. Anterior communicating artery and bilateral ACA branches are within normal limits. Left MCA M1 segment and bifurcation are patent without stenosis. Right MCA M1 segment and bifurcation are patent without stenosis. Bilateral MCA branches are within normal limits. Venous sinuses: Patent. Anatomic variants: Fetal type bilateral PCA origins. Subsequent diminutive vertebrobasilar system. Review of the MIP images confirms the above findings IMPRESSION: 1. Negative for large vessel occlusion. 2. 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 No hemodynamically significant arterial stenosis identified. 3. Patchy bilateral upper lung opacity suggestive of acute viral/atypical respiratory infection. See CTA chest reported separately. These results were communicated to Dr. ARory Percyat 12:01 pm on 05/02/2020 by text page via the [facility] system. Electronically Signed   By: HGenevie AnnM.D.   On: 05/02/2020 12:02   CT ANGIO CHEST PE W OR WO CONTRAST  Result Date: 05/02/2020 CLINICAL DATA:  High probability of pulmonary embolus. EXAM: CT ANGIOGRAPHY CHEST WITH CONTRAST TECHNIQUE: Multidetector CT imaging of the chest was performed using the standard protocol during bolus administration of intravenous contrast. Multiplanar CT image reconstructions and MIPs were obtained to evaluate the vascular anatomy. CONTRAST:  1554mOMNIPAQUE IOHEXOL 350 MG/ML SOLN COMPARISON:  August 15, 2019. FINDINGS: Cardiovascular: There is no evidence of large central pulmonary embolus seen in the main pulmonary artery or main portions of the left and right pulmonary arteries. Evaluation of the lower lobe branches of both pulmonary arteries is limited due to respiratory motion artifact and attenuation due to  body habitus. Smaller and more peripheral pulmonary emboli cannot be excluded on the basis of this exam. Normal  cardiac size. No pericardial effusion. No evidence of thoracic aortic dissection or aneurysm. Mediastinum/Nodes: No enlarged mediastinal, hilar, or axillary lymph nodes. Thyroid gland, trachea, and esophagus demonstrate no significant findings. Lungs/Pleura: No pneumothorax or pleural effusion is noted. Patchy airspace opacities are noted throughout both lungs which may represent multifocal pneumonia. Upper Abdomen: No acute abnormality. Musculoskeletal: No chest wall abnormality. No acute or significant osseous findings. Review of the MIP images confirms the above findings. IMPRESSION: 1. There is no evidence of large central pulmonary embolus seen in the main pulmonary artery or main portions of the left and right pulmonary arteries. Evaluation of the lower lobe branches of both pulmonary arteries is limited due to respiratory motion artifact and attenuation due to body habitus. Smaller and more peripheral pulmonary emboli cannot be excluded on the basis of this exam. 2. Patchy airspace opacities are noted throughout both lungs which may represent multifocal pneumonia. Electronically Signed   By: Marijo Conception M.D.   On: 05/02/2020 12:09   DG CHEST PORT 1 VIEW  Result Date: 05/03/2020 CLINICAL DATA:  70 year old female with shortness of breath. Confusion, altered mental status. Recent hip fracture, surgery. Negative for COVID-19 yesterday and on 04/29/2020. EXAM: PORTABLE CHEST 1 VIEW COMPARISON:  CTA chest yesterday and earlier. FINDINGS: Portable AP semi upright view at 0933 hours. New since portable chest x-ray 04/29/2020 multifocal bilateral patchy and indistinct pulmonary opacities. Lung apices appear relatively spared. Both lower lungs affected. Mildly lower lung volumes. Stable cardiac size and mediastinal contours. Visualized tracheal air column is within normal limits. No pneumothorax or pleural effusion. No air bronchograms. No acute osseous abnormality identified. IMPRESSION: Multifocal bilateral  patchy and indistinct pulmonary opacity is new compared to 04/29/2020, and compatible with Acute Pneumonia superimposed on chronic interstitial lung disease (which was demonstrated on Chest CTs 2021 and earlier). Electronically Signed   By: Genevie Ann M.D.   On: 05/03/2020 09:43   MM Digital Diagnostic Unilat L  Result Date: 04/07/2020 CLINICAL DATA:  Calcifications in the outer left breast on a recent screening mammogram. EXAM: DIGITAL DIAGNOSTIC LEFT MAMMOGRAM WITH CAD COMPARISON:  Previous exam(s). ACR Breast Density Category b: There are scattered areas of fibroglandular density. FINDINGS: 2D true lateral and spot magnification images of the left breast demonstrate a 2.6 x 0.7 x 0.4 cm group of calcifications arranged in a linear fashion in the upper outer quadrant of the breast in the mid to posterior portion of the breast. These vary in size, shape and density, including some linear shaped calcifications. Mammographic images were processed with CAD. IMPRESSION: 2.6 cm group of indeterminate calcifications in the upper outer quadrant of the left breast. 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 RECOMMENDATION: 3D stereotactic guided core needle biopsy of the 2.6 cm group of calcifications in the upper outer quadrant of the left breast. This has been discussed with the patient and scheduled at 11:30 a.m. on 04/20/2020. I have discussed the findings and recommendations with the patient. If applicable, a reminder letter will be sent to the patient regarding the next appointment. BI-RADS CATEGORY  4: Suspicious. Electronically Signed   By: Claudie Revering M.D.   On: 04/07/2020 16:10   DG C-Arm 1-60 Min  Result Date: 04/30/2020 CLINICAL DATA:  Trauma.  Intramedullary nail right femur. EXAM: RIGHT FEMUR 2 VIEWS; DG C-ARM 1-60 MIN COMPARISON:  Radiographs April 29, 2020. FINDINGS: Fluoro time: 6 minutes  and 38 seconds. Reported radiation: 166.89 mGy. Eleven C-arm fluoroscopic images were obtained intraoperatively  and submitted for post operative interpretation. These images demonstrate sequential changes associated with right intramedullary nail and screw fixation of a intertrochanteric femur fracture. Final images demonstrate improved, near anatomic alignment without unexpected findings. Please see the performing provider's procedural report for further detail. IMPRESSION: Intraoperative fluoroscopic imaging, as detailed above. Electronically Signed   By: Margaretha Sheffield MD   On: 04/30/2020 15:16   MM CLIP PLACEMENT LEFT  Result Date: 04/20/2020 CLINICAL DATA:  Post biopsy mammogram of the left breast for clip placement. EXAM: DIAGNOSTIC LEFT MAMMOGRAM POST STEREOTACTIC BIOPSY COMPARISON:  Previous exam(s). FINDINGS: Mammographic images were obtained following stereotactic guided biopsy of calcifications in the upper-outer left breast. The biopsy marking clip is in expected position at the site of biopsy. IMPRESSION: Appropriate positioning of the coil shaped biopsy marking clip at the site of biopsy in the upper-outer left breast. Final Assessment: Post Procedure Mammograms for Marker Placement Electronically Signed   By: Ammie Ferrier M.D.   On: 04/20/2020 12:36   DG Hip Unilat W or Wo Pelvis 2-3 Views Right  Result Date: 04/29/2020 CLINICAL DATA:  Pain following fall EXAM: DG HIP (WITH OR WITHOUT PELVIS) 2-3V RIGHT COMPARISON:  None. FINDINGS: Frontal pelvis as well as frontal and lateral right hip images were obtained. There is a comminuted intertrochanteric femur fracture on the right with impaction at the fracture site and varus angulation. There is avulsion of the greater trochanter on the right. No other fractures. No dislocation. There is no appreciable joint space narrowing. There is bony overgrowth along the superolateral aspect of each acetabulum. There is degenerative change in the lower lumbar spine. IMPRESSION: Comminuted intertrochanteric femur fracture on the right with impaction and varus  angulation at the fracture site as well as avulsion of the greater trochanter. No other fracture. No dislocation. 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 Bony overgrowth along the superolateral aspect of each acetabulum, a finding potentially placing patient at increased risk for femoroacetabular impingement. Electronically Signed   By: Lowella Grip III M.D.   On: 04/29/2020 12:00   DG FEMUR, MIN 2 VIEWS RIGHT  Result Date: 04/30/2020 CLINICAL DATA:  Trauma.  Intramedullary nail right femur. EXAM: RIGHT FEMUR 2 VIEWS; DG C-ARM 1-60 MIN COMPARISON:  Radiographs April 29, 2020. FINDINGS: Fluoro time: 6 minutes and 38 seconds. Reported radiation: 166.89 mGy. Eleven C-arm fluoroscopic images were obtained intraoperatively and submitted for post operative interpretation. These images demonstrate sequential changes associated with right intramedullary nail and screw fixation of a intertrochanteric femur fracture. Final images demonstrate improved, near anatomic alignment without unexpected findings. Please see the performing provider's procedural report for further detail. IMPRESSION: Intraoperative fluoroscopic imaging, as detailed above. Electronically Signed   By: Margaretha Sheffield MD   On: 04/30/2020 15:16   DG FEMUR PORT, MIN 2 VIEWS RIGHT  Result Date: 04/30/2020 CLINICAL DATA:  Right hip ORIF, postoperative assessment EXAM: RIGHT FEMUR PORTABLE 2 VIEW COMPARISON:  02/28/2021 FINDINGS: Right intertrochanteric fracture ORIF noted with intramedullary nail component with distal interlocking screws, a long threaded axial femoral neck screw component, and a distal threaded femoral neck screw component. The patient has a known greater trochanteric fragment which is better aligned than on the preoperative radiographs. No new fracture or new complicating feature. Mild spurring of the right acetabulum noted. 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 IMPRESSION: Right intertrochanteric fracture ORIF  without complicating feature identified. Electronically Signed   By:  Van Clines M.D.   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 This was found to be concordant by Dr. Ammie Ferrier. Pathology results were discussed with the patient by telephone. The patient reported doing well after the biopsy with tenderness at the site. Post biopsy instructions and care were reviewed and questions were answered. The patient was encouraged to call [facility] for any additional concerns. The patient was instructed to return for annual screening mammography and informed a reminder notice would be sent regarding this appointment. Pathology results reported by Stacie Acres RN on 04/21/2020. Electronically Signed   By: Ammie Ferrier M.D.   On: 04/21/2020 11:45   Result Date: 04/22/2020 CLINICAL DATA:  70 year old female presenting for stereotactic biopsy of calcifications in the upper-outer left breast. EXAM: LEFT BREAST STEREOTACTIC CORE NEEDLE BIOPSY COMPARISON:  Previous exams. FINDINGS: The patient and I discussed the procedure of stereotactic-guided biopsy including benefits and alternatives. We discussed the high likelihood of a successful procedure. We discussed the risks of the procedure including infection, bleeding, tissue injury, clip migration, and inadequate sampling. Informed written consent was given. The usual time out protocol was performed immediately prior to the procedure. Using sterile technique and 1% Lidocaine as local anesthetic, under stereotactic guidance, a 9 gauge vacuum assisted device was used to perform core needle biopsy of calcifications in the upper-outer quadrant of the left breast using a superior approach. Specimen radiograph was  performed showing calcifications in several core samples. Specimens with calcifications are identified for pathology. Lesion quadrant: Upper outer quadrant At the conclusion of the procedure, a coil shaped tissue marker clip was deployed into the biopsy cavity. Follow-up 2-view mammogram was performed and dictated separately. IMPRESSION: Stereotactic-guided biopsy of calcifications in the upper-outer left breast. No apparent complications. Electronically Signed: By: Ammie Ferrier M.D. On: 04/20/2020 12:37   CT HEAD CODE STROKE WO CONTRAST  Result Date: 05/02/2020 CLINICAL DATA:  Code stroke. 70 year old female altered mental status. EXAM: CT HEAD WITHOUT CONTRAST TECHNIQUE: Contiguous axial images were obtained from the base of the skull through the vertex without intravenous contrast. COMPARISON:  Head CT 11/09/2019. FINDINGS: Brain: Stable dystrophic calcifications bilateral basal ganglia. No midline shift, ventriculomegaly, mass effect, evidence of mass lesion, intracranial hemorrhage or evidence of cortically based acute infarction. Gray-white matter differentiation is within normal limits throughout the brain. Vascular: Calcified atherosclerosis at the skull base. No suspicious intracranial vascular hyperdensity. Skull: No acute osseous abnormality identified. Sinuses/Orbits: Generalized mild to moderate paranasal sinus mucosal thickening is new. No sinus fluid levels. Tympanic cavities and mastoids remain clear. Other: Visualized orbits and scalp soft tissues are within normal limits. ASPECTS [facility] Stroke Program Early CT Score) Total score (0-10 with 10 being normal): 10 IMPRESSION: 1. Stable and normal for age noncontrast CT appearance of the brain. ASPECTS 10. 2. New generalized paranasal sinus inflammation. 3. These results were communicated to Dr. Rory Percy at 11:35 am on 05/02/2020 by text page via the [facility] messaging system. Electronically Signed   By: Genevie Ann M.D.   On: 05/02/2020 11:36   VAS  [place] LOWER EXTREMITY VENOUS (DVT)  Result Date: 05/04/2020  Lower Venous DVT Study Indications: Edema.  Limitations: Poor ultrasound/tissue interface and body habitus. Comparison Study: 11/09/19 previous Performing Technologist: Abram Sander RVS  Examination  Guidelines: A complete evaluation includes B-mode imaging, spectral Doppler, color Doppler, and power Doppler as needed of all accessible portions of each vessel. Bilateral testing is considered an integral part of a complete examination. Limited examinations for reoccurring indications may be performed as noted. The reflux portion of the exam is performed with the patient in reverse [place].  +---------+---------------+---------+-----------+----------+--------------+ RIGHT    CompressibilityPhasicitySpontaneityPropertiesThrombus Aging +---------+---------------+---------+-----------+----------+--------------+ CFV      Full           Yes      Yes                                 +---------+---------------+---------+-----------+----------+--------------+ SFJ      Full                                                        +---------+---------------+---------+-----------+----------+--------------+ FV Prox  Full                                                        +---------+---------------+---------+-----------+----------+--------------+ FV Mid                  Yes      Yes                                 +---------+---------------+---------+-----------+----------+--------------+ FV Distal               Yes      Yes                                 +---------+---------------+---------+-----------+----------+--------------+ PFV      Full                                                        +---------+---------------+---------+-----------+----------+--------------+ POP      Full           Yes      Yes                                  +---------+---------------+---------+-----------+----------+--------------+ PTV      Full                                                        +---------+---------------+---------+-----------+----------+--------------+ PERO     Full                                                        +---------+---------------+---------+-----------+----------+--------------+   +---------+---------------+---------+-----------+----------+--------------+  LEFT     CompressibilityPhasicitySpontaneityPropertiesThrombus Aging +---------+---------------+---------+-----------+----------+--------------+ CFV      Full           Yes      Yes                                 +---------+---------------+---------+-----------+----------+--------------+ SFJ      Full                                                        +---------+---------------+---------+-----------+----------+--------------+ FV Prox  Full                                                        +---------+---------------+---------+-----------+----------+--------------+ FV Mid                  Yes      Yes                                 +---------+---------------+---------+-----------+----------+--------------+ FV Distal               Yes      Yes                                 +---------+---------------+---------+-----------+----------+--------------+ PFV      Full                                                        +---------+---------------+---------+-----------+----------+--------------+ POP      Full           Yes      Yes                                 +---------+---------------+---------+-----------+----------+--------------+ PTV      Full                                                        +---------+---------------+---------+-----------+----------+--------------+ PERO     Full                                                         +---------+---------------+---------+-----------+----------+--------------+     Summary: BILATERAL: - No evidence of deep vein thrombosis seen in the lower extremities, bilaterally. - No evidence of superficial venous thrombosis in the lower extremities, bilaterally. -No evidence of popliteal cyst, bilaterally.   *See table(s) above for measurements and observations. Electronically signed by Deitra Mayo  MD on 05/04/2020 at 1:40:27 PM.    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 ITP is likely since platelets improved significantly with recent steroid use. 3) right femur fracture 4) sepsis secondary to pneumonia 5) anemia secondary to blood loss following surgery  PLAN: -Baseline platelet count in our office typically ranges between about 50-70,000.  Platelets today are 77,000 which is consistent with baseline.  She has not required a platelet transfusion since 05/01/2020.  Recommend close monitoring and transfuse platelets for platelet count less than 20,000.  Would consider treatment for her chronic ITP if platelets persistently stay below 50,000 when she recovers from surgery and her acute illness. -She is requiring intermittent PRBC transfusion.  Hemoglobin 7.5 today.  Recommend PRBC transfusion for hemoglobin less than 7. -No indication to begin treatment of CMML at this time. No lab or clinical evidence of significant progression. -avoid medications that can lower platelets -Antibiotics per PCCM/hospitalist for treatment of her pneumonia.   LOS: 5 days   Mikey Bussing, DNP, AGPCNP-BC, AOCNP 05/04/20   Addendum  .Patient was Personally and independently interviewed, examined and relevant elements of the history of present illness were reviewed in details and an assessment and plan was created. All elements of the patient's history of present illness ,  assessment and plan were discussed in details with Mikey Bussing, DNP, AGPCNP-BC, AOCNP. The above documentation reflects our combined findings assessment and plan.  Sullivan Lone MD MS

## 2020-05-04 NOTE — Progress Notes (Signed)
Pt was given meds prior to coming down for study. She continues to move around while on bed. Unable to scan at this time.

## 2020-05-04 NOTE — Progress Notes (Signed)
3rd attempt to have MRI completed. Ativan given prior to MRI. Pt non compliant with test. Daughter tried to talk to pt but pt still refusing. MD notified and said will reassess in the morning.

## 2020-05-04 NOTE — Care Management Important Message (Signed)
Important Message  Patient Details  Name: Kathy Irwin MRN: [id number] Date of Birth: 1950-08-15   Medicare Important Message Given:  Yes     Shelda Altes 05/04/2020, 11:12 AM

## 2020-05-04 NOTE — Progress Notes (Signed)
PROGRESS NOTE    Kathy Irwin  JIR:[id number] DOB: 04/30/1950 DOA: 04/29/2020 PCP: Kerin Perna, NP  Brief Narrative:  HPI per Dr. Fuller Plan on 04/30/19 Kathy Irwin is a 70 y.o. female with medical history significant of HTN, interstitial lung disease, chronic respiratory failure with hypoxia on 2 L at night, DM type II, ITP,  CMML-1, and obesity presents after having a fall with right hip pain.  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  She landed on her right hip and reported severe pain thereafter.  Due to the pain she was unable to stand up or bear weight on the leg, and any kind of movement of that leg worsening pain.  At baseline patient currently feels short of breath is unchanged and she has been in her normal state of health otherwise prior to falling this morning.  She had not taken any of her home blood pressure medications.  ED Course: Admission as a emergency department patient was seen to be afebrile with respirations 30-34, and all other vital signs maintain. Labs significant for hemoglobin 10.5, platelets 30, and INR 1.3. X-rays revealed a communicated fracture of the right hip. Orthopedics was formally consulted with plans to take for surgical correction tomorrow. TRH called to admit.  **Interim History Was given another pack of platelets today (3 units total) and typed and screened and transfused 1 unit PRBCs again (3 units total). She underwent operative intervention and had intramedullary nail of the right femur fracture percutaneous fixation of the right femoral neck fracture.  This operatively she was doing okay but today she was extremely altered and encephalopathic.  She had decreased responsiveness and initially was thought from her narcotic medication so she was given some Narcan but this had minimal response.  ABG was checked and she was hypoxic.  Neurology  was consulted for further evaluation as well and they activated a code stroke with this was canceled later than on.  CT of the head showed no acute process and she also had a CTA of the head and neck.  Neurology recommending MRI given her hypoxia to rule out acute CVA and this was attempted twice today but patient was uncooperative but she is little bit better today but still somewhat confused.  Continues to have a tachycardia still but is not as bad as yesterday.  Pain medication has been adjusted by her orthopedic surgeon and almost all her narcotics have been discontinued.  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  Found to have a communicated intertrochanteric femur fracture on the right.  Orthopedics was consulted and took the patient for Surgical Intervention as an outpatient  -Admit to medical telemetry bed -Hip Fracture order set utilized -Pain medication has been adjusted by orthopedic surgery given that she has been overmedicated and likely encephalopathic in the setting of her opiates -Appreciate orthopedics consultative services we  will follow for further recommendations -Defer Pain Control and VTE prophylaxis to Ortho -PT recommending SNF; TOC consulted for assistance with Placement   Chronic ITP -Platelet count on admission 30,000 and trended up to 48,000 and then drop down to 43 again this morning.  Patient previously responsive to  steroids in the past.  Discussed with Dr.Kale who recommended getting platelet count up to at least 50,000. Patient was typed and screened while in the emergency department -Transfuse 3 unit of platelets total and now platelet count postsurgery is 77,000 after surgery -Continue to monitor transfuse as needed to goal of at least 50,000 platelets prior to surgery -Appreciate oncology consultative services  and they will follow-up with the patient again tomorrow  Transient Hypotension -Acute. On admission patient blood pressures noted to drop as low as 78/41 yesterday, but improved with IV bolus of 1.5 L normal saline IV fluids.  Patient reports that she had not taken her home medications of Prozac 20 mg daily or metoprolol 75 mg daily. -Continue Hold home blood pressure medications  -Give additional IV fluids as needed and given 2 L yesterday and will give half a liter today.  Ters todayand now on NS Maintenance  -Hold all Hypertensive Medications -Continue monitor blood pressures per protocol and last blood pressure was 102/82  Acute Encephalopathy in the setting of Hypoxia and Infection vs. Medication induced with opiates -Had AMS and was difficult to arouse this AM and Neurology evaluated for CODE Stroke -STAT Head CT done and showed no acute Process -CTA Head and Neck with No Emergent LVO  -CTA Chest with Multifocal PNA -ABG done and showed a PO2 of 44 -Given a Dose of Narcan given that she was given Narcotics for Pain; Narcan with minimum response -Will Treat Infection as below and continue antibiotics as below -Continue to Monitor and Trend -Neurology recommending MRI of Brain given Hypoxemia when Stable and reconsult if it is positive CVA and this is still pending to be done and she is agreeable to get this done today as she is more awake. -Obtain Cx as below -Correct Electrolytes if Necessary  -Continue to Follow  -Place on Delirium Precautions  Severe Sepsis 2/2 to PNA, Likely  present on Admission and worsened -Patient acutely decompensated today and worsened; Daughter states that prior to coming to the Hospital her mother had a new cough -DG Chest Xray on Admission showed "Lungs are clear. Heart size and pulmonary vascularity are normal. No pneumothorax. No adenopathy. No bone lesions." but likely did not show due to Dehydration   -WBC went from 10.3 -> 13.9 and is further worsening to 17.3; She is tachycardic, tachypenic and febrile now at 101.4 yesterday and today she is not febrile she remains tachycardic and tachypneic -CTA showed "There is no evidence of large central pulmonary embolus seen in the main pulmonary artery or main portions of the left and right pulmonary arteries. Evaluation of the lower lobe branches of both pulmonary arteries is limited due to respiratory motion artifact and attenuation due to body habitus. Smaller and more peripheral pulmonary emboli cannot be excluded on the basis of this exam. 2. Patchy airspace opacities are noted throughout both lungs which may represent multifocal pneumonia."  -Repeat CXR on 05/03/20 showed "Multifocal bilateral patchy and indistinct pulmonary opacity is new compared to 04/29/2020, and compatible with Acute Pneumonia superimposed on chronic interstitial lung disease (which was demonstrated on Chest CTs 2021 and earlier)."  -She is improving -Checked COVID Again and Negative; Respiratory Virus Panel  Negative As well  -PCT was 1.18 and is trended up in 1.68 and it is now 1.05; Will obtain Blood Cx x2, Check UA/Urine Cx -Start Xopenex/Atrovent -Repeat CXR in the AM  -Empirically Start Abx with IV Vancomycin and IV Cefepime continue for now and pulmonary recommends continuing for now and checking for a lower extremity Doppler to look for DVT as well -Checked Inflammatory Markers and showed LDH of 590, Ferritin of 1485, CRP 30.0, LA of 1.8, Likely elevated in the setting of Multifocal PNA; they are recommending at a  minimal covering with antibiotics for 5 days for possible aspiration pneumonitis -Pulmonary was consulted for further evaluation recommending continue antibiotics for this  -Check SLP for concern for Aspiration they are recommending a regular diet with thin liquids  Sinus Tachycardia, persistently elevated -Patient's HR Ranging from 130's-150's: Was better yesterday in the 130s and now in the 110s -D-Dimer was elevated at 1.64 -No PE on CTA -Given 1.5 Liters and will give a unit of Blood but then received an additional 500 mL bolus and then 500 mL bolus this morning -Continue to Monitor BP -Physiologic Response from Above -We will be checking lower extremity venous duplex for DVT and this was negative  Hypophosphatemia -Patient's Phos Level is now 2.4 -Replete with p.o. Phos-Nak -Continue to Monitor and Replete as Necessary -Repeat Phos Level in the AM  AKI, improved  -In the setting of Hypotension and Acute Surgical Intervention and Anemia -Patient's Hgb/Hct has dropped from 10.5/32.7 on Admission and is now 6.9/21.8 this AM so could be from Hypovolemia -Getting IVF and Blood -Patient's BUN/Cr went from 13/0.68 -> 21/0.97 -> 27/1.38 -> 14/0.71 and today it is 10/0.5 to -Avoid Nephrotoxic Medications, Contrast Dyes, Hypotension and Renally adjust medications -Repeat CMP in the AM   Acute on Chronic respiratory failure/interstitial lung disease -Patient reports that she chronically has shortness of breath is unchanged from previous.  Only utilizing oxygen at night -SpO2: 97 % O2 Flow Rate (L/min): 1 L/min FiO2 (%): 32 % -Continue oxygen as needed and wean as able -See as Above  -Chest x-ray on 05/03/2020 showed "Multifocal bilateral patchy and indistinct pulmonary opacity is new compared to 04/29/2020, and compatible with Acute Pneumonia superimposed on chronic interstitial lung disease (which was demonstrated on Chest CTs 2021 and earlier)." -We will need an ambulatory home O2  screen prior to discharge -Consulted pulmonary and they recommended continuing empiric antibiotics as above; appreciate Dr. Anastasia Pall evaluation -Recommended following up in outpatient setting  Macrocytic Anemia/Acute Blood Loss Anemia in the Setting of Post-Surgical Drop -Hemoglobin 10.5 on admission which appears near patient's baseline of 10-11 g/dL.  -Slowly started trending down and went down 7.5/23.8 and so he was typed and screened and transfused 1 unit PRBCs; could have been it partly dilutional drop given that she is getting IV fluids -Posttransfusion it was 7.8/23.0 and has dropped further to 6.9/21.8 yesterday; Now Hgb/Hct is now 7.1/21.0; after the third unit her hemoglobin/hematocrit trended up to 7.6/31.9 yesterday and today it is 7.5/23.6 -She is status post 3 units of transfused PRBCs -Continue to Monitor for S/Sx of Bleeding; No overt bleeding noted  -Repeat CBC in the AM   CMML-1 -Patient not on treatment at this time.  Followed in the outpatient setting by Dr. Irene Limbo. -Oncology Consulted may need further intervention given  Diabetes mellitus type 2, controlled -Last hemoglobin A1c noted to be 5.6 on 02/05/2020.  Home medications include metformin 1000 mg daily and Amaryl 4 mg daily. -Hypoglycemic protocols -  Check hemoglobin A1c in a.m. -Hold home oral medications -CBGs before every meal with sensitive SSI -CBGs ranging from 133- 081  Chronic Diastolic CHF -Last EF was noted to be greater than 70-75% in 07/2019.  Patient appears to be euvolemic at this time. -Daily weights -Strict intake and output -She is + 4.634 L since admission given her transfusions -Currently she is still getting normal saline at 75 MLS per hour  GERD -Continue home PPI with pantoprazole 40 minutes daily  Hyperlipidemia -Continue Pravastatin 40 mg p.o. nightly  Morbid Obesity -Complicates overall prognosis and care -Estimated body mass index is 46.25 kg/m as calculated from the  following:   Height as of this encounter: 5' 2.99" (1.6 m).   Weight as of this encounter: 118.4 kg.  -Weight Loss and Dietary Counseling given  DVT prophylaxis: SCDs for now on further care per Ortho Code Status: FULL CODE  Family Communication: Discussed with the daughter at bedside Disposition Plan: Pending evaluation by PT and OT clearance by orthopedic surgery  Status is: Inpatient  Remains inpatient appropriate because:IV treatments appropriate due to intensity of illness or inability to take PO and Inpatient level of care appropriate due to severity of illness   Dispo: The patient is from: Home              Anticipated d/c is to: TBD              Anticipated d/c date is: 2 days              Patient currently is not medically stable to d/c.  Consultants:   Orthopedic Surgery  Neurology  Pulmonary  Procedures: CTA Head and Neck MRI  LE Venous Duplex Summary:  BILATERAL:  - No evidence of deep vein thrombosis seen in the lower extremities,  bilaterally.  - No evidence of superficial venous thrombosis in the lower extremities,  bilaterally.  -No evidence of popliteal cyst, bilaterally.   Antimicrobials:  Anti-infectives (From admission, onward)   Start     Dose/Rate Route Frequency Ordered Stop   05/03/20 1400  vancomycin (VANCOREADY) IVPB 1750 mg/350 mL        1,750 mg 175 mL/hr over 120 Minutes Intravenous Every 24 hours 05/02/20 1317     05/02/20 1400  vancomycin (VANCOCIN) 2,500 mg in sodium chloride 0.9 % 500 mL IVPB        2,500 mg 250 mL/hr over 120 Minutes Intravenous  Once 05/02/20 1305 05/02/20 1850   05/02/20 1315  ceFEPIme (MAXIPIME) 2 g in sodium chloride 0.9 % 100 mL IVPB        2 g 200 mL/hr over 30 Minutes Intravenous Every 8 hours 05/02/20 1305     04/30/20 2200  ceFAZolin (ANCEF) IVPB 2g/100 mL premix        2 g 200 mL/hr over 30 Minutes Intravenous Every 8 hours 04/30/20 1810 05/01/20 1443   04/30/20 1438  vancomycin (VANCOCIN) powder   Status:  Discontinued          As needed 04/30/20 1438 04/30/20 1603   04/30/20 0600  ceFAZolin (ANCEF) IVPB 2g/100 mL premix        2 g 200 mL/hr over 30 Minutes Intravenous On call to O.R. 04/29/20 2128 04/30/20 1257        Subjective: Seen and examined at bedside and and she is much more awake and alert and oriented today than she was yesterday.  Still a little confused but much improved from yesterday.  Agreeable to  get her MRI.  States that she is in some pain in her right hip.  No nausea or vomiting.  Wanting to rest.  No other concerns or complaints at this time.  Objective: Vitals:   05/03/20 2240 05/04/20 0327 05/04/20 0847 05/04/20 1201  BP: (!) 122/54 (!) 120/59 125/73 102/82  Pulse: (!) 113 (!) 108 (!) 108 (!) 102  Resp: (!) 32 (!) 24 (!) 24 18  Temp: 98.2 F (36.8 C) 97.9 F (36.6 C) 98.8 F (37.1 C) 98.3 F (36.8 C)  TempSrc: Oral Axillary Oral Oral  SpO2: 96% 99% 97%   Weight:  118.4 kg    Height:        Intake/Output Summary (Last 24 hours) at 05/04/2020 1602 Last data filed at 05/04/2020 0329 Gross per 24 hour  Intake --  Output 400 ml  Net -400 ml   Filed Weights   05/01/20 0425 05/02/20 0644 05/04/20 0327  Weight: 118.2 kg 117.6 kg 118.4 kg   Examination: Physical Exam:  Constitutional: WN/WD obese African-American female currently in no acute distress appears calm and a little bit more comfortable today and she appears more alert but is still slightly confused Eyes: Lids and conjunctivae normal, sclerae anicteric  ENMT: External Ears, Nose appear normal. Grossly normal hearing. Neck: Appears normal, supple, no cervical masses, normal ROM, no appreciable thyromegaly: No JVD Respiratory: Diminished to auscultation bilaterally, no wheezing, rales, rhonchi or crackles. Normal respiratory effort and patient is not tachypenic. No accessory muscle use.  Unlabored breathing Cardiovascular: RRR, no murmurs / rubs / gallops. S1 and S2 auscultated.  Minimal  extremity edema Abdomen: Soft, non-tender, distended secondary to body habitus.  Bowel sounds positive.  GU: Deferred. Musculoskeletal: No clubbing / cyanosis of digits/nails. No joint deformity upper and lower extremities.   Skin: No rashes, lesions, ulcers on limited skin evaluation. No induration; Warm and dry.  Neurologic: CN 2-12 grossly intact with no focal deficits. Romberg sign and cerebellar reflexes not assessed.  Psychiatric: Slightly impaired judgment and insight. Alert and oriented x 2 and able to tell me where she is and her name and her birthday. Normal mood and appropriate affect.    Data Reviewed: I have personally reviewed following labs and imaging studies  CBC: Recent Labs  Lab 04/29/20 1205 04/29/20 2133 04/30/20 1142 04/30/20 1431 05/01/20 0301 05/02/20 0224 05/03/20 0428 05/04/20 0639  WBC 7.0   < > 10.7*  --  10.3 13.9* 17.3* 17.6*  NEUTROABS 1.6*  --   --   --  3.6 6.4  --  10.3*  HGB 10.5*   < > 8.9* 7.8* 6.9* 7.1* 7.6* 7.5*  HCT 32.7*   < > 29.0* 23.0* 21.8* 21.0* 21.9* 23.6*  MCV 100.6*   < > 102.8*  --  96.5 94.6 88.3 94.0  PLT 30*   < > 71*  --  47* 65* 68* 77*   < > = values in this interval not displayed.   Basic Metabolic Panel: Recent Labs  Lab 04/30/20 0334 04/30/20 1431 05/01/20 0301 05/02/20 0224 05/03/20 0220 05/03/20 1114 05/04/20 0639  NA 138 139 135 137 142  --  137  K 4.4 5.8* 4.9 4.6 4.6  --  4.2  CL 104  --  103 106 113*  --  107  CO2 26  --  24 24 21*  --  22  GLUCOSE 164*  --  178* 194* 165*  --  168*  BUN 21  --  27* 14 13  --  10  CREATININE 0.97  --  1.38* 0.71 0.68  --  0.52  CALCIUM 9.0  --  8.6* 8.6* 8.6*  --  8.5*  MG  --   --  1.7 2.1  --  2.0 1.7  PHOS  --   --  4.6 2.1*  --  1.4* 2.4*   GFR: Estimated Creatinine Clearance: 82.6 mL/min (by C-G formula based on SCr of 0.52 mg/dL). Liver Function Tests: Recent Labs  Lab 05/01/20 0301 05/02/20 0224 05/03/20 1114 05/04/20 0639  AST 32 34 44* 37  ALT _0 ALKPHOS 38 43 46 46  BILITOT 1.0 1.2 1.7* 1.1  PROT 5.1* 5.3* 5.5* 5.4*  ALBUMIN 2.9* 2.6* 2.3* 2.2*   No results for input(s): LIPASE, AMYLASE in the last 168 hours. No results for input(s): AMMONIA in the last 168 hours. Coagulation Profile: Recent Labs  Lab 04/29/20 1205  INR 1.3*   Cardiac Enzymes: No results for input(s): CKTOTAL, CKMB, CKMBINDEX, TROPONINI in the last 168 hours. BNP (last 3 results) No results for input(s): PROBNP in the last 8760 hours. HbA1C: No results for input(s): HGBA1C in the last 72 hours. CBG: Recent Labs  Lab 05/03/20 0737 05/03/20 1212 05/03/20 2239 05/04/20 0800 05/04/20 1155  GLUCAP 117* 152* 148* 133* 161*   Lipid Profile: No results for input(s): CHOL, HDL, LDLCALC, TRIG, CHOLHDL, LDLDIRECT in the last 72 hours. Thyroid Function Tests: No results for input(s): TSH, T4TOTAL, FREET4, T3FREE, THYROIDAB in the last 72 hours. Anemia Panel: Recent Labs    05/02/20 1435  FERRITIN 1,485*   Sepsis Labs: Recent Labs  Lab 05/02/20 1435 05/03/20 0220 05/04/20 0639  PROCALCITON 1.18 1.68 1.05  LATICACIDVEN 1.8  --   --     Recent Results (from the past 240 hour(s))  Resp Panel by RT-PCR (Flu A&B, Covid) Nasopharyngeal Swab     Status: None   Collection Time: 04/29/20 12:59 PM   Specimen: Nasopharyngeal Swab; Nasopharyngeal(NP) swabs in vial transport medium  Result Value Ref Range Status   SARS Coronavirus 2 by RT PCR NEGATIVE NEGATIVE Final    Comment: (NOTE) SARS-CoV-2 target nucleic acids are NOT DETECTED.  The SARS-CoV-2 RNA is generally detectable in upper respiratory specimens during the acute phase of infection. The lowest concentration of SARS-CoV-2 viral copies this assay can detect is 138 copies/mL. A negative result does not preclude SARS-Cov-2 infection and should not be used as the sole basis for treatment or other patient management decisions. A negative result may occur with  improper specimen  collection/handling, submission of specimen other than nasopharyngeal swab, presence of viral mutation(s) within the areas targeted by this assay, and inadequate number of viral copies(<138 copies/mL). A negative result must be combined with clinical observations, patient history, and epidemiological information. The expected result is Negative.  Fact Sheet for Patients:  EntrepreneurPulse.com.au  Fact Sheet for Healthcare Providers:  IncredibleEmployment.be  This test is no t yet approved or cleared by the [place] FDA and  has been authorized for detection and/or diagnosis of SARS-CoV-2 by FDA under an Emergency Use Authorization (EUA). This EUA will remain  in effect (meaning this test can be used) for the duration of the COVID-19 declaration under Section 564(b)(1) of the Act, 21 U.S.C.section 360bbb-3(b)(1), unless the authorization is terminated  or revoked sooner.       Influenza A by PCR NEGATIVE NEGATIVE Final   Influenza B by PCR NEGATIVE NEGATIVE Final    Comment: (NOTE) The Xpert Xpress  SARS-CoV-2/FLU/RSV plus assay is intended as an aid in the diagnosis of influenza from Nasopharyngeal swab specimens and should not be used as a sole basis for treatment. Nasal washings and aspirates are unacceptable for Xpert Xpress SARS-CoV-2/FLU/RSV testing.  Fact Sheet for Patients: EntrepreneurPulse.com.au  Fact Sheet for Healthcare Providers: IncredibleEmployment.be  This test is not yet approved or cleared by the [place] FDA and has been authorized for detection and/or diagnosis of SARS-CoV-2 by FDA under an Emergency Use Authorization (EUA). This EUA will remain in effect (meaning this test can be used) for the duration of the COVID-19 declaration under Section 564(b)(1) of the Act, 21 U.S.C. section 360bbb-3(b)(1), unless the authorization is terminated or revoked.  Performed at [facility], [place] [address], [place], [place] [address]   Surgical pcr screen     Status: None   Collection Time: 04/29/20 10:38 PM   Specimen: Nasal Mucosa; Nasal Swab  Result Value Ref Range Status   MRSA, PCR NEGATIVE NEGATIVE Final   Staphylococcus aureus NEGATIVE NEGATIVE Final    Comment: (NOTE) The Xpert SA Assay (FDA approved for NASAL specimens in patients 11 years of age and older), is one component of a comprehensive surveillance program. It is not intended to diagnose infection nor to guide or monitor treatment. Performed at [facility], [place] [address], [place], [place] [address]   Resp Panel by RT-PCR (Flu A&B, Covid) Nasopharyngeal Swab     Status: None   Collection Time: 05/02/20  1:01 PM   Specimen: Nasopharyngeal Swab; Nasopharyngeal(NP) swabs in vial transport medium  Result Value Ref Range Status   SARS Coronavirus 2 by RT PCR NEGATIVE NEGATIVE Final    Comment: (NOTE) SARS-CoV-2 target nucleic acids are NOT DETECTED.  The SARS-CoV-2 RNA is generally detectable in upper respiratory specimens during the acute phase of infection. The lowest concentration of SARS-CoV-2 viral copies this assay can detect is 138 copies/mL. A negative result does not preclude SARS-Cov-2 infection and should not be used as the sole basis for treatment or other patient management decisions. A negative result may occur with  improper specimen collection/handling, submission of specimen other than nasopharyngeal swab, presence of viral mutation(s) within the areas targeted by this assay, and inadequate number of viral copies(<138 copies/mL). A negative result must be combined with clinical observations, patient history, and epidemiological information. The expected result is Negative.  Fact Sheet for Patients:  EntrepreneurPulse.com.au  Fact Sheet for Healthcare Providers:  IncredibleEmployment.be  This test is no t yet approved or  cleared by the [place] FDA and  has been authorized for detection and/or diagnosis of SARS-CoV-2 by FDA under an Emergency Use Authorization (EUA). This EUA will remain  in effect (meaning this test can be used) for the duration of the COVID-19 declaration under Section 564(b)(1) of the Act, 21 U.S.C.section 360bbb-3(b)(1), unless the authorization is terminated  or revoked sooner.       Influenza A by PCR NEGATIVE NEGATIVE Final   Influenza B by PCR NEGATIVE NEGATIVE Final    Comment: (NOTE) The Xpert Xpress SARS-CoV-2/FLU/RSV plus assay is intended as an aid in the diagnosis of influenza from Nasopharyngeal swab specimens and should not be used as a sole basis for treatment. Nasal washings and aspirates are unacceptable for Xpert Xpress SARS-CoV-2/FLU/RSV testing.  Fact Sheet for Patients: EntrepreneurPulse.com.au  Fact Sheet for Healthcare Providers: IncredibleEmployment.be  This test is not yet approved or cleared by the [place] FDA and has been authorized for detection  and/or diagnosis of SARS-CoV-2 by FDA under an Emergency Use Authorization (EUA). This EUA will remain in effect (meaning this test can be used) for the duration of the COVID-19 declaration under Section 564(b)(1) of the Act, 21 U.S.C. section 360bbb-3(b)(1), unless the authorization is terminated or revoked.  Performed at [facility], [place] [address], [place], [place] [address]   Respiratory (~20 pathogens) panel by PCR     Status: None   Collection Time: 05/02/20  1:01 PM   Specimen: Nasopharyngeal Swab; Respiratory  Result Value Ref Range Status   Adenovirus NOT DETECTED NOT DETECTED Final   Coronavirus 229E NOT DETECTED NOT DETECTED Final    Comment: (NOTE) The Coronavirus on the Respiratory Panel, DOES NOT test for the novel  Coronavirus (2019 nCoV)    Coronavirus HKU1 NOT DETECTED NOT DETECTED Final   Coronavirus NL63 NOT DETECTED NOT  DETECTED Final   Coronavirus OC43 NOT DETECTED NOT DETECTED Final   Metapneumovirus NOT DETECTED NOT DETECTED Final   Rhinovirus / Enterovirus NOT DETECTED NOT DETECTED Final   Influenza A NOT DETECTED NOT DETECTED Final   Influenza B NOT DETECTED NOT DETECTED Final   Parainfluenza Virus 1 NOT DETECTED NOT DETECTED Final   Parainfluenza Virus 2 NOT DETECTED NOT DETECTED Final   Parainfluenza Virus 3 NOT DETECTED NOT DETECTED Final   Parainfluenza Virus 4 NOT DETECTED NOT DETECTED Final   Respiratory Syncytial Virus NOT DETECTED NOT DETECTED Final   Bordetella pertussis NOT DETECTED NOT DETECTED Final   Bordetella Parapertussis NOT DETECTED NOT DETECTED Final   Chlamydophila pneumoniae NOT DETECTED NOT DETECTED Final   Mycoplasma pneumoniae NOT DETECTED NOT DETECTED Final    Comment: Performed at [facility], [place]. [address], [place], [place] [address]  Culture, blood (routine x 2)     Status: None (Preliminary result)   Collection Time: 05/02/20  2:15 PM   Specimen: BLOOD  Result Value Ref Range Status   Specimen Description BLOOD LEFT ANTECUBITAL  Final   Special Requests   Final    BOTTLES DRAWN AEROBIC AND ANAEROBIC Blood Culture results may not be optimal due to an inadequate volume of blood received in culture bottles   Culture   Final    NO GROWTH 2 DAYS Performed at [facility], [place] [address], [place], [place] [address]    Report Status PENDING  Incomplete  Culture, blood (routine x 2)     Status: None (Preliminary result)   Collection Time: 05/02/20  2:31 PM   Specimen: BLOOD LEFT HAND  Result Value Ref Range Status   Specimen Description BLOOD LEFT HAND  Final   Special Requests   Final    BOTTLES DRAWN AEROBIC ONLY Blood Culture adequate volume   Culture   Final    NO GROWTH 2 DAYS Performed at [facility], [place] [address] [place],  [address]    Report Status PENDING  Incomplete    RN Pressure Injury Documentation:     Estimated body  mass index is 46.25 kg/m as calculated from the following:   Height as of this encounter: 5' 2.99" (1.6 m).   Weight as of this encounter: 118.4 kg.  Malnutrition Type:   Malnutrition Characteristics:   Nutrition Interventions:   Radiology Studies: DG CHEST PORT 1 VIEW  Result Date: 05/03/2020 CLINICAL DATA:  70 year old female with shortness of breath. Confusion, altered mental status. Recent hip fracture, surgery. Negative for COVID-19 yesterday and on 04/29/2020. EXAM: PORTABLE CHEST  1 VIEW COMPARISON:  CTA chest yesterday and earlier. FINDINGS: Portable AP semi upright view at 0933 hours. New since portable chest x-ray 04/29/2020 multifocal bilateral patchy and indistinct pulmonary opacities. Lung apices appear relatively spared. Both lower lungs affected. Mildly lower lung volumes. Stable cardiac size and mediastinal contours. Visualized tracheal air column is within normal limits. No pneumothorax or pleural effusion. No air bronchograms. No acute osseous abnormality identified. IMPRESSION: Multifocal bilateral patchy and indistinct pulmonary opacity is new compared to 04/29/2020, and compatible with Acute Pneumonia superimposed on chronic interstitial lung disease (which was demonstrated on Chest CTs 2021 and earlier). Electronically Signed   By: Genevie Ann M.D.   On: 05/03/2020 09:43   VAS [place] LOWER EXTREMITY VENOUS (DVT)  Result Date: 05/04/2020  Lower Venous DVT Study Indications: Edema.  Limitations: Poor ultrasound/tissue interface and body habitus. Comparison Study: 11/09/19 previous Performing Technologist: Abram Sander RVS  Examination Guidelines: A complete evaluation includes B-mode imaging, spectral Doppler, color Doppler, and power Doppler as needed of all accessible portions of each vessel. Bilateral testing is considered an integral part of a complete examination. Limited examinations for reoccurring indications may be performed as noted. The reflux portion of the exam is performed  with the patient in reverse Trendelenburg.  +---------+---------------+---------+-----------+----------+--------------+ RIGHT    CompressibilityPhasicitySpontaneityPropertiesThrombus Aging +---------+---------------+---------+-----------+----------+--------------+ CFV      Full           Yes      Yes                                 +---------+---------------+---------+-----------+----------+--------------+ SFJ      Full                                                        +---------+---------------+---------+-----------+----------+--------------+ FV Prox  Full                                                        +---------+---------------+---------+-----------+----------+--------------+ FV Mid                  Yes      Yes                                 +---------+---------------+---------+-----------+----------+--------------+ FV Distal               Yes      Yes                                 +---------+---------------+---------+-----------+----------+--------------+ PFV      Full                                                        +---------+---------------+---------+-----------+----------+--------------+ POP      Full           Yes  Yes                                 +---------+---------------+---------+-----------+----------+--------------+ PTV      Full                                                        +---------+---------------+---------+-----------+----------+--------------+ PERO     Full                                                        +---------+---------------+---------+-----------+----------+--------------+   +---------+---------------+---------+-----------+----------+--------------+ LEFT     CompressibilityPhasicitySpontaneityPropertiesThrombus Aging +---------+---------------+---------+-----------+----------+--------------+ CFV      Full           Yes      Yes                                  +---------+---------------+---------+-----------+----------+--------------+ SFJ      Full                                                        +---------+---------------+---------+-----------+----------+--------------+ FV Prox  Full                                                        +---------+---------------+---------+-----------+----------+--------------+ FV Mid                  Yes      Yes                                 +---------+---------------+---------+-----------+----------+--------------+ FV Distal               Yes      Yes                                 +---------+---------------+---------+-----------+----------+--------------+ PFV      Full                                                        +---------+---------------+---------+-----------+----------+--------------+ POP      Full           Yes      Yes                                 +---------+---------------+---------+-----------+----------+--------------+ PTV      Full                                                        +---------+---------------+---------+-----------+----------+--------------+  PERO     Full                                                        +---------+---------------+---------+-----------+----------+--------------+     Summary: BILATERAL: - No evidence of deep vein thrombosis seen in the lower extremities, bilaterally. - No evidence of superficial venous thrombosis in the lower extremities, bilaterally. -No evidence of popliteal cyst, bilaterally.   *See table(s) above for measurements and observations. Electronically signed by Deitra Mayo MD on 05/04/2020 at 1:40:27 PM.    Final    Scheduled Meds: . sodium chloride   Intravenous Once  . sodium chloride   Intravenous Once  . aspirin EC  81 mg Oral Daily  . diphenhydrAMINE  25 mg Intravenous Once  . docusate sodium  100 mg Oral BID  . famotidine  40 mg Oral Daily  . insulin aspart  0-9 Units  Subcutaneous TID WC  . ipratropium  0.5 mg Nebulization BID  . levalbuterol  0.63 mg Nebulization BID  . metoprolol succinate  75 mg Oral Daily  . montelukast  10 mg Oral QHS  . oxybutynin  5 mg Oral TID  . pantoprazole  40 mg Oral Daily  . potassium & sodium phosphates  1 packet Oral TID WC & HS  . pravastatin  40 mg Oral QHS  . Ensure Max Protein  11 oz Oral TID  . senna-docusate  1 tablet Oral Q0600   Continuous Infusions: . sodium chloride    . ceFEPime (MAXIPIME) IV 2 g (05/04/20 1348)  . sodium chloride    . vancomycin 1,750 mg (05/04/20 1508)    LOS: 5 days   Kerney Elbe, DO [facility] PAGER is on [place]  If 7PM-7AM, please contact night-coverage [email or web address]

## 2020-05-04 NOTE — Evaluation (Signed)
Occupational Therapy Evaluation Patient Details Name: Kathy Irwin MRN: [id number] DOB: Jan 26, 1951 Today's Date: 05/04/2020    History of Present Illness 70 yo female with mechanical fall was admitted for IM nailing of her R intertrochanteric fracture. 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   Clinical Impression   PTA patient was independent with ADLs/IADLs including working at a daycare. Patient on 3L O2 via [place] at baseline. Patient currently demonstrates deficits in strength, activity tolerance, cardiopulmonary endurance, and cognition requiring Max A +2 to Total A +2 and heavy vebal cues for bed mobility and LB ADLs. Did not progress beyond EOB 2/2 pain with patient demonstrating L lateral lean in sitting fearing bearing full weight on R hip. Female family member present at bedside states that she requested patient no longer receive heavy pain meds. Education on premedication prior to working with therapy to manage pain and increase participation and progress with POC. Patient/family expressed verbal understanding. Cues for shift to midline with patient c/o increased pain. Patient would benefit from continued acute OT services in prep for safe d/c to next level of care with recommendation for SNF rehab.     Follow Up Recommendations  SNF    Equipment Recommendations  Other (comment) (TBD)    Recommendations for Other Services       Precautions / Restrictions Precautions Precautions: Fall Restrictions Weight Bearing Restrictions: Yes RLE Weight Bearing: Weight bearing as tolerated      Mobility Bed Mobility Overal bed mobility: Needs Assistance Bed Mobility: Supine to Sit;Sit to Supine     Supine to sit: Max assist;+2 for physical assistance;HOB elevated Sit to supine: Total assist;+2 for physical assistance;HOB elevated   General bed mobility comments: Max A to Total A +2 for bed mobility with 1-step verbal cueing.    Transfers Overall transfer  level: Needs assistance               General transfer comment: Deferred for patient/therapist safety.    Balance Overall balance assessment: Needs assistance Sitting-balance support: Single extremity supported;Feet unsupported Sitting balance-Leahy Scale: Poor Sitting balance - Comments: Patient with L lateral lean 2/2 anticipation of pain. External assist required to attain midline. Postural control: Left lateral lean                                 ADL either performed or assessed with clinical judgement   ADL Overall ADL's : Needs assistance/impaired   Eating/Feeding Details (indicate cue type and reason): Family member feeding patient upon entry. With probing patient states that she can feed herself. Family ed on allowing patient to do as much as she can for herself.             Upper Body Dressing : Minimal assistance;Bed level Upper Body Dressing Details (indicate cue type and reason): Donned anterior hospital gown. Lower Body Dressing: Total assistance;Bed level Lower Body Dressing Details (indicate cue type and reason): Total A to don footwear at bed level.     Toileting- Clothing Manipulation and Hygiene: Total assistance;Bed level Toileting - Clothing Manipulation Details (indicate cue type and reason): Total A for hygiene management at bed level. Purewick failed.             Vision Baseline Vision/History: Wears glasses Wears Glasses: At all times Patient Visual Report: No change from baseline Vision Assessment?: No apparent visual deficits     Perception  Praxis      Pertinent Vitals/Pain Pain Assessment: Faces Faces Pain Scale: Hurts whole lot Pain Location: R let with movement Pain Descriptors / Indicators: Operative site guarding;Grimacing;Guarding Pain Intervention(s): Limited activity within patient's tolerance;Monitored during session;Repositioned     Hand Dominance Right   Extremity/Trunk Assessment Upper Extremity  Assessment Upper Extremity Assessment: Generalized weakness   Lower Extremity Assessment Lower Extremity Assessment: Defer to PT evaluation   Cervical / Trunk Assessment Cervical / Trunk Assessment: Normal   Communication Communication Communication: No difficulties   Cognition Arousal/Alertness: Lethargic Behavior During Therapy: Flat affect Overall Cognitive Status: Impaired/Different from baseline Area of Impairment: Awareness;Problem solving;Following commands;Attention                 Orientation Level: Situation;Time Current Attention Level: Selective Memory: Decreased short-term memory;Decreased recall of precautions Following Commands: Follows one step commands inconsistently;Follows one step commands with increased time Safety/Judgement: Decreased awareness of safety;Decreased awareness of deficits Awareness: Intellectual Problem Solving: Slow processing;Requires verbal cues;Requires tactile cues General Comments: Patient disoriented to situation stating that she broke her knee. Increased time to follow 1-step verbal commands.   General Comments  SpO2 84 upon entry with good waveform. Patient found to be on 1L O2 via [place]. Patient titrated back up to 3L with SpO2 returning >90%.    Exercises Exercises:  (2- on RLE, 3+ LLE)   Shoulder Instructions      Home Living Family/patient expects to be discharged to:: Private residence Living Arrangements: Alone Available Help at Discharge: Available PRN/intermittently;Family Type of Home: House Home Access: Stairs to enter [employer] of Steps: 3 Entrance Stairs-Rails: Right;Left Home Layout: One level     Bathroom Shower/Tub: Occupational psychologist: [place]: Bedside commode;Walker - 4 wheels;Grab bars - toilet;Grab bars - tub/shower;Shower seat          Prior Functioning/Environment Level of Independence: Independent        Comments: ADLs, IADLs, mobility  independently. Patient was working at a daycare.        OT Problem List: Decreased strength;Decreased range of motion;Decreased activity tolerance;Impaired balance (sitting and/or standing);Decreased cognition;Decreased safety awareness;Decreased knowledge of use of DME or AE;Decreased knowledge of precautions;Pain      OT Treatment/Interventions: Self-care/ADL training;Therapeutic exercise;Energy conservation;DME and/or AE instruction;Therapeutic activities;Cognitive remediation/compensation;Patient/family education;Balance training    OT Goals(Current goals can be found in the care plan section) Acute Rehab OT Goals Patient Stated Goal: to get home OT Goal Formulation: With patient Time For Goal Achievement: 05/18/20 Potential to Achieve Goals: Good ADL Goals Pt Will Perform Eating: with set-up;sitting Pt Will Perform Grooming: with set-up;sitting Pt Will Perform Upper Body Dressing: with set-up;sitting Pt Will Perform Lower Body Dressing: with min assist;with adaptive equipment;sitting/lateral leans;sit to/from stand Pt Will Transfer to Toilet: with min assist;ambulating;bedside commode Pt Will Perform Toileting - Clothing Manipulation and hygiene: with min assist;sit to/from stand;sitting/lateral leans  OT Frequency: Min 2X/week   Barriers to D/C: Decreased caregiver support  Lives alone       Co-evaluation              AM-PAC OT "6 Clicks" Daily Activity     Outcome Measure Help from another person eating meals?: A Little Help from another person taking care of personal grooming?: A Little Help from another person toileting, which includes using toliet, bedpan, or urinal?: Total Help from another person bathing (including washing, rinsing, drying)?: Total Help from another person to put on and taking off regular upper  body clothing?: A Little Help from another person to put on and taking off regular lower body clothing?: Total 6 Click Score: 12   End of Session  Nurse Communication: Patient requests pain meds  Activity Tolerance: No increased pain Patient left: in bed;with call bell/phone within reach;with bed alarm set;Other (comment) (Bed in chair position.)  OT Visit Diagnosis: Other abnormalities of gait and mobility (R26.89);Muscle weakness (generalized) (M62.81);History of falling (Z91.81);Pain Pain - Right/Left: Right Pain - part of body: Hip;Leg                Time: 3578-9784 OT Time Calculation (min): 27 min Charges:  OT General Charges $OT Visit: 1 Visit OT Evaluation $OT Eval Moderate Complexity: 1 Mod OT Treatments $Self Care/Home Management : 8-22 mins  Isbella Arline H. OTR/L Supplemental OT, Department of rehab services [phone]  Riti Rollyson R H. 05/04/2020, 1:51 PM

## 2020-05-04 NOTE — TOC CAGE-AID Note (Signed)
Transition of Care [facility]) - CAGE-AID Screening   Patient Details  Name: Kathy Irwin MRN: [id number] Date of Birth: 1950-09-27  [facility]) CM/SW Contact:    Trula Ore, [place] Phone Number: 05/04/2020, 10:57 AM  CSW unable to complete Cage-Aid screening. Patient inappropriate due to current mental status.   Clinical Narrative:    CAGE-AID Screening: Substance Abuse Screening unable to be completed due to: : Patient unable to participate

## 2020-05-04 NOTE — Progress Notes (Signed)
Lower extremity venous has been completed.   Preliminary results in CV Proc.   Abram Sander 05/04/2020 10:30 AM

## 2020-05-04 NOTE — Progress Notes (Signed)
NAME:  Kathy Irwin, MRN:  [id number], DOB:  [birth year], LOS: 5 ADMISSION DATE:  04/29/2020, CONSULTATION DATE:  1/17 REFERRING MD:  Alfredia Ferguson, CHIEF COMPLAINT:  Sepsis   Brief History:  70 y/o female admitted on 1/13 after a trip and fall rith right hip fracture.  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  She underwent IM nailing of R femur fracture and percutaneous fixation of R femoral neck fracture by Dr. Doreatha Martin on 1/14.  PCCM consulted on 1/17 for fever, hypotension, sepsis of uncertain etiology.   History of Present Illness:  70 y/o female admitted on 1/13 after a trip and fall rith right hip fracture.  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  She underwent IM nailing of R femur fracture and percutaneous fixation of R femoral neck fracture by Dr. Doreatha Martin on 1/14.  PCCM consulted on 1/17 for fever, hypotension, sepsis of uncertain etiology.  She required 3 U PRVC after surgery as well as Platelets.  She has had confusion and drowsiness since surgery and has received narcan periodically. Neurology has seen her as well, they activated a code stroke, CT head and CTA head/neck were normal.  MRI brain ordered but never performed.   PCCM asked to see her in the setting of worsening hypotension and fever on 1/18.  She has had two negative COVID tests this admission: 1/13 and again on 1/16.   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   Her blood pressure is normal, she is afebrile, but she remains somewhat drowsy and tachycardic.   Of note when she was hospitalized in 2021 for cellulitis she had persistent delirium.  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 Followed by Dr. Irene Limbo  [facility] Events:  1/13 admission 1/14 ORIF R hip 1/16 Neurology consult, code stroke for confusion, no stroke 1/17 PCCM consult for sepsis  Consults:  Neurology Orthopedics PCCM  Procedures:  1/14 IM nailing of R femur fracture and percutaneous fixation of R femoral neck fracture by Dr. Doreatha Martin   Significant Diagnostic Tests:  1/16 CT angio chest > no PE, patchy airspace disease may represent multifocal pneumonia 1/16 CT angio head/neck> negative for large vessel occlusion, patchy bilateral upper lung opacity suggestive of viral infection; compared to prior studies (images personally reveiwed) there is perhaps a mild increase in the amount of ground glass opacification bilatearlly but no new area of consolidation   Micro Data:  1/13 sars cov 2/flu > negative 1/14 sars cov 2/flu > negative 1/16 resp vir panel >neg 1/16 blood culture> ngtd   Antimicrobials:  1/14 cefazolin > 1/15 1/14 vanc  1/16 cefepime>  1/16 vanc >   Interim History / Subjective:  Feels a little better  no dyspnea Remains drowsy  Objective   Blood pressure 125/73, pulse (!) 108, temperature 98.8 F (37.1 C), temperature source Oral, resp. rate (!) 24, height 5' 2.99" (1.6 m), weight 118.4 kg, SpO2 97 %.        Intake/Output Summary (Last 24 hours) at 05/04/2020 0932 Last data filed  at 05/04/2020 0329 Gross per 24 hour  Intake 0 ml  Output 1150 ml  Net -1150 ml   Filed Weights   05/01/20 0425 05/02/20 0644 05/04/20 0327  Weight: 118.2 kg 117.6 kg 118.4 kg    Examination:  General:  Resting comfortably in bed HENT: NCAT OP clear PULM: CTA B, normal effort CV: RRR, no mgr GI: BS+, soft, nontender MSK: normal bulk and tone, R hip surgical dressing clean/dry/intact, no drainage or redness Neuro: drowsy but wakes  easily   Resolved Hospital Problem list     Assessment & Plan:  Fever, concern for pneumonia> hard to say if her infiltrates are worse than baseline.  I suppose she could have had an aspiration event when she was confused/mental status worse but she doesn't have the increased cough or mucus production to support a diagnosis of pneumonia.  U/A unremarkable.  Fever better today, WBC stable.   Would continue broad spectrum antibiotics again tomorrow and monitor blood cultures.   If worse WBC or fever would image surgical site vs check lower extremity doppler to look for DVT. At a minimum would cover with antibiotic for 5 days for possible aspiration pneumonitis  Chronic hypersensitivity pneumonitis> perhaps worse on imaging, needs to follow up with pulmonary again Will notify our office to arrange follow up  Acute respiratory failure with hypoxemia> I don't think she needs the oxygen incentifve spirometry Wean off oxygen   PCCM available prn if her condition worsens  Best practice (evaluated daily)   Per Primary  Goals of Care:   Per Primary  Labs   CBC: Recent Labs  Lab 04/29/20 1205 04/29/20 2133 04/30/20 1142 04/30/20 1431 05/01/20 0301 05/02/20 0224 05/03/20 0428 05/04/20 0639  WBC 7.0   < > 10.7*  --  10.3 13.9* 17.3* 17.6*  NEUTROABS 1.6*  --   --   --  3.6 6.4  --  10.3*  HGB 10.5*   < > 8.9* 7.8* 6.9* 7.1* 7.6* 7.5*  HCT 32.7*   < > 29.0* 23.0* 21.8* 21.0* 21.9* 23.6*  MCV 100.6*   < > 102.8*  --  96.5 94.6 88.3 94.0  PLT 30*   < > 71*  --  47* 65* 68* 77*   < > = values in this interval not displayed.    Basic Metabolic Panel: Recent Labs  Lab 04/30/20 0334 04/30/20 1431 05/01/20 0301 05/02/20 0224 05/03/20 0220 05/03/20 1114 05/04/20 0639  NA 138 139 135 137 142  --  137  K 4.4 5.8* 4.9 4.6 4.6  --  4.2  CL 104  --  103 106 113*  --  107  CO2 26  --  24 24 21*  --  22  GLUCOSE 164*  --  178* 194* 165*  --  168*  BUN 21  --  27* 14 13  --  10   CREATININE 0.97  --  1.38* 0.71 0.68  --  0.52  CALCIUM 9.0  --  8.6* 8.6* 8.6*  --  8.5*  MG  --   --  1.7 2.1  --  2.0 1.7  PHOS  --   --  4.6 2.1*  --  1.4* 2.4*   GFR: Estimated Creatinine Clearance: 82.6 mL/min (by C-G formula based on SCr of 0.52 mg/dL). Recent Labs  Lab 05/01/20 0301 05/02/20 0224 05/02/20 1435 05/03/20 0220 05/03/20 0428 05/04/20 0639  PROCALCITON  --   --  1.18 1.68  --  1.05  WBC 10.3  13.9*  --   --  17.3* 17.6*  LATICACIDVEN  --   --  1.8  --   --   --     Liver Function Tests: Recent Labs  Lab 05/01/20 0301 05/02/20 0224 05/03/20 1114 05/04/20 0639  AST 32 34 44* 37  ALT _0 ALKPHOS 38 43 46 46  BILITOT 1.0 1.2 1.7* 1.1  PROT 5.1* 5.3* 5.5* 5.4*  ALBUMIN 2.9* 2.6* 2.3* 2.2*   No results for input(s): LIPASE, AMYLASE in the last 168 hours. No results for input(s): AMMONIA in the last 168 hours.  ABG    Component Value Date/Time   PHART 7.450 05/02/2020 1058   PCO2ART 33.7 05/02/2020 1058   PO2ART 44.4 (L) 05/02/2020 1058   HCO3 23.0 05/02/2020 1058   TCO2 28 04/30/2020 1431   ACIDBASEDEF 0.5 05/02/2020 1058   O2SAT 80.5 05/02/2020 1058     Coagulation Profile: Recent Labs  Lab 04/29/20 1205  INR 1.3*    Cardiac Enzymes: No results for input(s): CKTOTAL, CKMB, CKMBINDEX, TROPONINI in the last 168 hours.  HbA1C: Hemoglobin A1C  Date/Time Value Ref Range Status  02/05/2020 09:30 AM 5.6 4.0 - 5.6 % Final   Hgb A1c MFr Bld  Date/Time Value Ref Range Status  04/30/2020 03:34 AM 6.7 (H) 4.8 - 5.6 % Final    Comment:    (NOTE) Pre diabetes:          5.7%-6.4%  Diabetes:              >6.4%  Glycemic control for   <7.0% adults with diabetes   11/08/2019 05:05 PM 7.2 (H) 4.8 - 5.6 % Final    Comment:    (NOTE) Pre diabetes:          5.7%-6.4%  Diabetes:              >6.4%  Glycemic control for   <7.0% adults with diabetes     CBG: Recent Labs  Lab 05/02/20 2149 05/03/20 0737 05/03/20 1212  05/03/20 2239 05/04/20 0800  GLUCAP 137* 117* 152* 148* 133*    Review of Systems:   Gen: Denies fever, chills, weight change, fatigue, night sweats HEENT: Denies blurred vision, double vision, hearing loss, tinnitus, sinus congestion, rhinorrhea, sore throat, neck stiffness, dysphagia PULM: per HPI CV: Denies chest pain, edema, orthopnea, paroxysmal nocturnal dyspnea, palpitations GI: Denies abdominal pain, nausea, vomiting, diarrhea, hematochezia, melena, constipation, change in bowel habits GU: Denies dysuria, hematuria, polyuria, oliguria, urethral discharge Endocrine: Denies hot or cold intolerance, polyuria, polyphagia or appetite change Derm: Denies rash, dry skin, scaling or peeling skin change Heme: Denies easy bruising, bleeding, bleeding gums Neuro: Denies headache, numbness, weakness, slurred speech, loss of memory or consciousness   Past Medical History:  She,  has a past medical history of Acute respiratory failure with hypoxia ([place]) (12/2018), Diabetes mellitus without complication ([place]), Hypersensitivity pneumonitis ([place]) (12/19/2017), Hypertension, and ILD (interstitial lung disease) ([place]) (12/24/2018).   Surgical History:   Past Surgical History:  Procedure Laterality Date  . ABDOMINAL HYSTERECTOMY    . INTRAMEDULLARY (IM) NAIL INTERTROCHANTERIC Right 04/30/2020   Procedure: INTRAMEDULLARY (IM) NAIL INTERTROCHANTRIC;  Surgeon: Shona Needles, MD;  Location: [facility];  Service: Orthopedics;  Laterality: Right;  Marland Kitchen VIDEO BRONCHOSCOPY Bilateral 04/02/2019   Procedure: VIDEO BRONCHOSCOPY WITH FLUORO;  Surgeon: Marshell Garfinkel, MD;  Location: [facility];  Service: Cardiopulmonary;  Laterality: Bilateral;     Social History:   reports that she has never  smoked. She has never used smokeless tobacco. She reports that she does not drink alcohol and does not use drugs.   Family History:  Her family history includes Diabetes in an other family member; Hypertension in an other  family member.   Allergies Allergies  Allergen Reactions  . Other Shortness Of Breath and Other (See Comments)    Newspaper ink =  new chest pain, also  . Iodine Other (See Comments)    Unknown reaction  . Merbromin Other (See Comments)    Unknown reaction - Mercurochrome- "Was a long time ago"   . Tape Rash     Home Medications  Prior to Admission medications   Medication Sig Start Date End Date Taking? Authorizing Provider  acetaminophen (TYLENOL) 500 MG tablet Take 1,000 mg by mouth at bedtime.   Yes [provider]  albuterol (VENTOLIN HFA) 108 (90 Base) MCG/ACT inhaler Inhale 1-2 puffs into the lungs every 6 (six) hours as needed for wheezing or shortness of breath. 02/11/20  Yes Kerin Perna, NP  [employer] D3 125 MCG (5000 UT) capsule Take 5,000 Units by mouth daily. 07/16/19  Yes [provider]  Ensure Max Protein (ENSURE MAX PROTEIN) LIQD Take 330 mLs (11 oz total) by mouth 3 (three) times daily. 12/01/19  Yes Pokhrel, Laxman, MD  famotidine (PEPCID) 40 MG tablet Take 1 tablet (40 mg total) by mouth daily. 12/02/19  Yes Pokhrel, Laxman, MD  fluticasone (FLONASE) 50 MCG/ACT nasal spray Place 2 sprays into both nostrils daily as needed for allergies or rhinitis. 02/05/20  Yes Kerin Perna, NP  furosemide (LASIX) 20 MG tablet Take 1 pill in the morning Patient taking differently: Take 20 mg by mouth daily. 02/05/20  Yes Kerin Perna, NP  glimepiride (AMARYL) 4 MG tablet Take 4 mg by mouth daily. 02/24/20  Yes [provider]  magnesium gluconate (MAGONATE) 30 MG tablet Take 1 tablet (30 mg total) by mouth 2 (two) times daily. Patient taking differently: Take 30 mg by mouth daily. 01/01/20  Yes Kerin Perna, NP  metFORMIN (GLUCOPHAGE) 1000 MG tablet Take 1 tablet (1,000 mg total) by mouth 2 (two) times daily with a meal. Patient taking differently: Take 1,000 mg by mouth daily with breakfast. 12/30/19  Yes Kerin Perna, NP   methocarbamol (ROBAXIN) 500 MG tablet Take 1 tablet (500 mg total) by mouth every 8 (eight) hours as needed for muscle spasms. 02/05/20  Yes Kerin Perna, NP  metoprolol succinate (TOPROL-XL) 25 MG 24 hr tablet Take 1 tablet (25 mg total) by mouth daily. Patient taking differently: Take 25 mg by mouth See admin instructions. Take 1 tablet (25 mg) combine with 1 tablet (50 mg) to make 75 mg totally by mouth every day 02/05/20  Yes Kerin Perna, NP  metoprolol succinate (TOPROL-XL) 50 MG 24 hr tablet TAKE 1 TABLET BY MOUTH EVERY DAY IN THE MORNING Patient taking differently: Take 50 mg by mouth See admin instructions. Take 1 tablet (50 mg) combine with 1 tablet (25 mg) to make 75 mg totally by mouth every day 04/07/20  Yes Kerin Perna, NP  montelukast (SINGULAIR) 10 MG tablet Take 10 mg by mouth at bedtime. 03/05/19  Yes [provider]  Multiple Vitamin (MULTIVITAMIN WITH MINERALS) TABS tablet Take 1 tablet by mouth daily. 12/02/19  Yes Pokhrel, Laxman, MD  olopatadine (PATANOL) 0.1 % ophthalmic solution Place 1 drop into both eyes 2 (two) times daily. Patient taking differently: Place 1 drop into  both eyes 2 (two) times daily as needed for allergies (itchy eyes). 07/24/19  Yes Wieters, Hallie C, PA-C  ondansetron (ZOFRAN) 4 MG tablet Take 1 tablet (4 mg total) by mouth every 8 (eight) hours as needed for nausea or vomiting. 08/15/19  Yes Rancour, Annie Main, MD  oxybutynin (DITROPAN) 5 MG tablet Take 1 tablet (5 mg total) by mouth 3 (three) times daily. 04/26/20  Yes Kerin Perna, NP  pantoprazole (PROTONIX) 40 MG tablet Take 1 tablet (40 mg total) by mouth daily. 12/01/19  Yes Pokhrel, Laxman, MD  polyethylene glycol (MIRALAX / GLYCOLAX) 17 g packet Take 17 g by mouth daily as needed for mild constipation or moderate constipation. 12/01/19  Yes Pokhrel, Laxman, MD  pravastatin (PRAVACHOL) 40 MG tablet Take 1 tablet (40 mg total) by mouth at bedtime. 12/30/19  Yes Kerin Perna, NP  pregabalin (LYRICA) 100 MG capsule Take 1 capsule (100 mg total) by mouth at bedtime. 02/05/20  Yes Kerin Perna, NP  senna-docusate (SENOKOT-S) 8.6-50 MG tablet Take 1 tablet by mouth 2 (two) times daily. 11/24/19  Yes Domenic Polite, MD  simethicone (MYLICON) 80 MG chewable tablet Chew 1 tablet (80 mg total) by mouth 4 (four) times daily as needed for flatulence. 12/01/19  Yes Pokhrel, Laxman, MD  sodium chloride (OCEAN) 0.65 % SOLN nasal spray Place 1 spray into both nostrils as needed for congestion. 08/12/19  Yes Dessa Phi, DO  tamsulosin (FLOMAX) 0.4 MG CAPS capsule Take 1 capsule (0.4 mg total) by mouth daily. 03/22/20  Yes Kerin Perna, NP  blood glucose meter kit and supplies KIT Dispense based on patient and insurance preference. Use up to four times daily as directed. (FOR ICD-9 250.00, 250.01). For QAC - HS accuchecks. 11/22/18   Thurnell Lose, MD  blood glucose meter kit and supplies Dispense based on patient and insurance preference. Use up to four times daily as directed. (FOR ICD-10 E10.9, E11.9). 12/30/19   Kerin Perna, NP  cetirizine (ZYRTEC ALLERGY) 10 MG tablet Take 1 tablet (10 mg total) by mouth daily for 7 days. Patient not taking: Reported on 04/29/2020 03/17/20 03/24/20  Lannie Fields, PA-C  glucose blood (FREESTYLE LITE) test strip For glucose testing every before meals at bedtime. Diagnosis E 11.65  Can substitute to any accepted brand 11/22/18   Thurnell Lose, MD  linaclotide [facility]) 145 MCG CAPS capsule Take 1 capsule (145 mcg total) by mouth daily before breakfast. 12/02/19   Pokhrel, Corrie Mckusick, MD  sertraline (ZOLOFT) 100 MG tablet Take 1 pill at bedtime Patient not taking: No sig reported 12/30/19   Kerin Perna, NP     Critical care time: n/a     Roselie Awkward, MD [facility] Pager: [phone] Cell: [phone] If no response, call [phone]

## 2020-05-05 ENCOUNTER — Telehealth: Payer: Self-pay

## 2020-05-05 ENCOUNTER — Inpatient Hospital Stay (HOSPITAL_COMMUNITY): Payer: 59

## 2020-05-05 DIAGNOSIS — J9621 Acute and chronic respiratory failure with hypoxia: Secondary | ICD-10-CM

## 2020-05-05 DIAGNOSIS — I5032 Chronic diastolic (congestive) heart failure: Secondary | ICD-10-CM | POA: Diagnosis not present

## 2020-05-05 DIAGNOSIS — J189 Pneumonia, unspecified organism: Secondary | ICD-10-CM

## 2020-05-05 DIAGNOSIS — S72141A Displaced intertrochanteric fracture of right femur, initial encounter for closed fracture: Secondary | ICD-10-CM | POA: Diagnosis not present

## 2020-05-05 DIAGNOSIS — G9341 Metabolic encephalopathy: Secondary | ICD-10-CM

## 2020-05-05 LAB — CBC WITH DIFFERENTIAL/PLATELET
Abs Immature Granulocytes: 3 10*3/uL — ABNORMAL HIGH (ref 0.00–0.07)
Basophils Absolute: 0.1 10*3/uL (ref 0.0–0.1)
Basophils Relative: 1 %
Eosinophils Absolute: 0 10*3/uL (ref 0.0–0.5)
Eosinophils Relative: 0 %
HCT: 23.8 % — ABNORMAL LOW (ref 36.0–46.0)
Hemoglobin: 7.4 g/dL — ABNORMAL LOW (ref 12.0–15.0)
Immature Granulocytes: 17 %
Lymphocytes Relative: 13 %
Lymphs Abs: 2.3 10*3/uL (ref 0.7–4.0)
MCH: 29.4 pg (ref 26.0–34.0)
MCHC: 31.1 g/dL (ref 30.0–36.0)
MCV: 94.4 fL (ref 80.0–100.0)
Monocytes Absolute: 2.8 10*3/uL — ABNORMAL HIGH (ref 0.1–1.0)
Monocytes Relative: 16 %
Neutro Abs: 9 10*3/uL — ABNORMAL HIGH (ref 1.7–7.7)
Neutrophils Relative %: 53 %
Platelets: 76 10*3/uL — ABNORMAL LOW (ref 150–400)
RBC: 2.52 MIL/uL — ABNORMAL LOW (ref 3.87–5.11)
RDW: 20.1 % — ABNORMAL HIGH (ref 11.5–15.5)
WBC: 17.2 10*3/uL — ABNORMAL HIGH (ref 4.0–10.5)
nRBC: 5 % — ABNORMAL HIGH (ref 0.0–0.2)

## 2020-05-05 LAB — MAGNESIUM: Magnesium: 1.6 mg/dL — ABNORMAL LOW (ref 1.7–2.4)

## 2020-05-05 LAB — COMPREHENSIVE METABOLIC PANEL
ALT: 24 U/L (ref 0–44)
AST: 43 U/L — ABNORMAL HIGH (ref 15–41)
Albumin: 2.1 g/dL — ABNORMAL LOW (ref 3.5–5.0)
Alkaline Phosphatase: 51 U/L (ref 38–126)
Anion gap: 6 (ref 5–15)
BUN: 13 mg/dL (ref 8–23)
CO2: 22 mmol/L (ref 22–32)
Calcium: 8.5 mg/dL — ABNORMAL LOW (ref 8.9–10.3)
Chloride: 109 mmol/L (ref 98–111)
Creatinine, Ser: 0.58 mg/dL (ref 0.44–1.00)
GFR, Estimated: >60 mL/min (ref >60)
Glucose, Bld: 152 mg/dL — ABNORMAL HIGH (ref 70–99)
Potassium: 4.2 mmol/L (ref 3.5–5.1)
Sodium: 137 mmol/L (ref 135–145)
Total Bilirubin: 1.3 mg/dL — ABNORMAL HIGH (ref 0.3–1.2)
Total Protein: 5.4 g/dL — ABNORMAL LOW (ref 6.5–8.1)

## 2020-05-05 LAB — GLUCOSE, CAPILLARY
Glucose-Capillary: 127 mg/dL — ABNORMAL HIGH (ref 70–99)
Glucose-Capillary: 146 mg/dL — ABNORMAL HIGH (ref 70–99)
Glucose-Capillary: 215 mg/dL — ABNORMAL HIGH (ref 70–99)
Glucose-Capillary: 156 mg/dL — ABNORMAL HIGH (ref 70–99)

## 2020-05-05 LAB — PHOSPHORUS: Phosphorus: 2.1 mg/dL — ABNORMAL LOW (ref 2.5–4.6)

## 2020-05-05 MED ORDER — ACETAMINOPHEN 500 MG PO TABS
1000.0000 mg | ORAL_TABLET | Freq: Three times a day (TID) | ORAL | Status: DC | PRN
  Filled 2020-05-05: qty 2

## 2020-05-05 MED ORDER — TRAMADOL HCL 50 MG PO TABS
50.0000 mg | ORAL_TABLET | Freq: Four times a day (QID) | ORAL | Status: DC | PRN
  Administered 2020-05-06 – 2020-05-11 (×10): 50 mg via ORAL
  Filled 2020-05-05 (×12): qty 1

## 2020-05-05 MED ORDER — MAGNESIUM SULFATE 2 GM/50ML IV SOLN
2.0000 g | Freq: Once | INTRAVENOUS | Status: AC
  Administered 2020-05-05: 2 g via INTRAVENOUS
  Filled 2020-05-05: qty 50

## 2020-05-05 NOTE — Progress Notes (Signed)
Pharmacy Antibiotic Note  Kathy Irwin is a 70 y.o. female admitted on 04/29/2020 withright hip pain now s/p right hip fx repair, now with suspected HCAP. PMH includes HTN, T2DM, ILD, chronic respiratory failure,ITP, CMML-1,and obesity . Pharmacy has been consulted for cefepime and vancomycin dosing.  Scr stable at 0.5 (at baseline). WBC stable around 17 today, afebrile. Currently on day #4 of antibiotics.   Plan: Vancomycin 2500 mg x1 then 1750 mg q24h, predicted AUC 534, goal 400-550 - consider VT at steady state if continues  Cefepime 2g q8h Monitor cultures, renal function, clinical progression, de-escalation as appropriate  Height: 5' 2.99" (160 cm) Weight: 120.4 kg (265 lb 6.9 oz) IBW/kg (Calculated) : 52.38  Temp (24hrs), Avg:98.1 F (36.7 C), Min:97.7 F (36.5 C), Max:99 F (37.2 C)  Recent Labs  Lab 05/01/20 0301 05/02/20 0224 05/02/20 1435 05/03/20 0220 05/03/20 0428 05/04/20 0639 05/05/20 0257  WBC 10.3 13.9*  --   --  17.3* 17.6* 17.2*  CREATININE 1.38* 0.71  --  0.68  --  0.52 0.58  LATICACIDVEN  --   --  1.8  --   --   --   --     Estimated Creatinine Clearance: 83.4 mL/min (by C-G formula based on SCr of 0.58 mg/dL).    Allergies  Allergen Reactions  . Other Shortness Of Breath and Other (See Comments)    Newspaper ink =  new chest pain, also  . Ativan [Lorazepam] Other (See Comments)    Paradoxical effect  . Iodine Other (See Comments)    Unknown reaction  . Merbromin Other (See Comments)    Unknown reaction - Mercurochrome- "Was a long time ago"   . Tape Rash    Antimicrobials this admission: Vancomycin 1/16 >>  Cefepime 1/16 >>   Dose adjustments this admission: N/A  Microbiology results: 1/16 BCx: ngtd 1/16 Respratory PCR: neg 1/13 MRSA PCR: negative   Thank you for allowing pharmacy to be a part of this patient's care.  Antonietta Jewel, PharmD, [place] Clinical Pharmacist  Phone: [phone] 05/05/2020 12:15 PM  Please check AMION for  all [place] phone numbers After 10:00 PM, call [place] [phone]

## 2020-05-05 NOTE — TOC Progression Note (Signed)
Transition of Care [facility]) - Progression Note    Patient Details  Name: Kathy Irwin MRN: [id number] Date of Birth: January 27, 1951  [facility]) CM/SW [place], [place] Phone Number: 05/05/2020, 10:14 AM  Clinical Narrative:     Juliann Pulse with [facility] confirmed they can accept patient for SNF placement. CSW will need to start insurance authorization close to patient being medically ready.   CSW will continue      Expected Discharge Plan: Skilled Nursing Facility Barriers to Discharge: Continued Medical Work up  Expected Discharge Plan and Services Expected Discharge Plan: [place] arrangements for the past 2 months: Single Family Home                                       Social Determinants of Health (SDOH) Interventions    Readmission Risk Interventions Readmission Risk Prevention Plan 08/12/2019  Transportation Screening Complete  PCP or Specialist Appt within 3-5 Days Complete  HRI or [place] Complete  Social Work Consult for [place] Planning/Counseling Complete  Palliative Care Screening Not Applicable  Medication Review Press photographer) Complete  Some recent data might be hidden

## 2020-05-05 NOTE — Telephone Encounter (Signed)
Patient was last seen on 08/30/2019 by Dr. Vaughan Browner.   Dr. Vaughan Browner, would you like for her to be scheduled in a 30 min slot if possible?

## 2020-05-05 NOTE — Telephone Encounter (Signed)
-----  Message from Juanito Doom, MD sent at 05/04/2020 10:05 AM EST ----- Hi,  Please arrange a routine follow up appointment with Dr. Vaughan Browner.  We saw this lady in the hospital and it looks like she has been lost to follow up.  Her infiltrates look a little worse and she hasn't been in the clinic for a while, so Dr. Vaughan Browner probably wants 30 minutes to see her, but I'll defer to him to make that decision.  Ruby Cola

## 2020-05-05 NOTE — Progress Notes (Signed)
Orthopaedic Trauma Progress Note  S: Doing okay today, rates pain as "so-so" but seems controlled on current regimen. Wants to get out of bed today, kept trying to get to bedside chair overnight. Daughter is at bedside, states patient seems less confused this afternoon compared to yesterday.  O:  Vitals:   05/05/20 0808 05/05/20 1111  BP:  118/63  Pulse:  96  Resp:  18  Temp:  98.4 F (36.9 C)  SpO2: 100% 98%   General: Awake and alert, NAD RLE: Swelling present. Proximal dressing with slight amount of drainage but otherwise CDI. No significant tenderness with palpation through thigh. Ankle dorsiflexion/plantarflexion intact. Neurovascularly intact  Imaging: Stable postop imaging.   Labs:  Results for orders placed or performed during the hospital encounter of 04/29/20 (from the past 24 hour(s))  Glucose, capillary     Status: Abnormal   Collection Time: 05/04/20  5:47 PM  Result Value Ref Range   Glucose-Capillary 168 (H) 70 - 99 mg/dL  CBC with Differential/Platelet     Status: Abnormal   Collection Time: 05/05/20  2:57 AM  Result Value Ref Range   WBC 17.2 (H) 4.0 - 10.5 K/uL   RBC 2.52 (L) 3.87 - 5.11 MIL/uL   Hemoglobin 7.4 (L) 12.0 - 15.0 g/dL   HCT 23.8 (L) 36.0 - 46.0 %   MCV 94.4 80.0 - 100.0 fL   MCH 29.4 26.0 - 34.0 pg   MCHC 31.1 30.0 - 36.0 g/dL   RDW 20.1 (H) 11.5 - 15.5 %   Platelets 76 (L) 150 - 400 K/uL   nRBC 5.0 (H) 0.0 - 0.2 %   Neutrophils Relative % 53 %   Neutro Abs 9.0 (H) 1.7 - 7.7 K/uL   Lymphocytes Relative 13 %   Lymphs Abs 2.3 0.7 - 4.0 K/uL   Monocytes Relative 16 %   Monocytes Absolute 2.8 (H) 0.1 - 1.0 K/uL   Eosinophils Relative 0 %   Eosinophils Absolute 0.0 0.0 - 0.5 K/uL   Basophils Relative 1 %   Basophils Absolute 0.1 0.0 - 0.1 K/uL   WBC Morphology MILD LEFT SHIFT (1-5% METAS, OCC MYELO, OCC BANDS)    Immature Granulocytes 17 %   Abs Immature Granulocytes 3.00 (H) 0.00 - 0.07 K/uL   Polychromasia PRESENT   Comprehensive  metabolic panel     Status: Abnormal   Collection Time: 05/05/20  2:57 AM  Result Value Ref Range   Sodium 137 135 - 145 mmol/L   Potassium 4.2 3.5 - 5.1 mmol/L   Chloride 109 98 - 111 mmol/L   CO2 22 22 - 32 mmol/L   Glucose, Bld 152 (H) 70 - 99 mg/dL   BUN 13 8 - 23 mg/dL   Creatinine, Ser 0.58 0.44 - 1.00 mg/dL   Calcium 8.5 (L) 8.9 - 10.3 mg/dL   Total Protein 5.4 (L) 6.5 - 8.1 g/dL   Albumin 2.1 (L) 3.5 - 5.0 g/dL   AST 43 (H) 15 - 41 U/L   ALT 24 0 - 44 U/L   Alkaline Phosphatase 51 38 - 126 U/L   Total Bilirubin 1.3 (H) 0.3 - 1.2 mg/dL   GFR, Estimated >60 >60 mL/min   Anion gap 6 5 - 15  Magnesium     Status: Abnormal   Collection Time: 05/05/20  2:57 AM  Result Value Ref Range   Magnesium 1.6 (L) 1.7 - 2.4 mg/dL  Phosphorus     Status: Abnormal   Collection Time: 05/05/20  2:57 AM  Result Value Ref Range   Phosphorus 2.1 (L) 2.5 - 4.6 mg/dL  Glucose, capillary     Status: Abnormal   Collection Time: 05/05/20  7:56 AM  Result Value Ref Range   Glucose-Capillary 127 (H) 70 - 99 mg/dL  Glucose, capillary     Status: Abnormal   Collection Time: 05/05/20 10:56 AM  Result Value Ref Range   Glucose-Capillary 146 (H) 70 - 99 mg/dL    Assessment: 70 year old female s/p fall w/ left hip fracture s/p IMN on 04/30/20   Weightbearing: WBAT RLE  Insicional and dressing care: Plan to remove tomorrow  Orthopedic device(s):None needed  CV/Blood loss:Currently stable. Has had 3 units PRBCs and 3 units platelets in total  Pain management: Avoid narcotics for now. 1. Tylenol 1000 q 8 hours scheduled 2. Toradol 15 mg IV q 6 hours PRN (have to be judicial with this due to AKI-Cr this AM is 0.58) 3. Tramadol 50 mg q 6 hours PRN   VTE prophylaxis: Currently on 81 mg ASA for VTE prophylaxis. Have not started anything more aggressive due to continued anemia.   ID: per hospitalist. On Vanc and cefepime now  Medical co-morbidities: per hospitalist  Dispo: Therapies as tolerated.  PT/OT recommending SNF. Has insurance authorization for when medically stable  Follow - up plan: Continue to follow in hospital. Will need outpatient follow up in 2-3 weeks post discharge   [place] Dominique A. Carmie [facility] [phone] (office) [email or web address]

## 2020-05-05 NOTE — Progress Notes (Signed)
PROGRESS NOTE    Kathy Irwin   FIE:[id number]  DOB: [birth year]  DOA: 04/29/2020 PCP: Kerin Perna, NP   Brief Narrative:  Story Kathy Irwin is a 70 y.o.femalewith medical history significant ofHTN, interstitial lung disease (hypersensitivity pneumonitis),chronic respiratory failure with hypoxia on 2 L at night,DM type II, ITP,CMML-1,and obesity presents after having a mechanicl fall while at work and right hip pain.   In ED > subtrochanteric fracture of right femur RR in 30s Platelets in 30s.   1/14 underwent IM nail/ percutaneous fixation of femoral neck fractuire 1/15 Hb 6.9 from 8.9 1/16- increasingly lethargic after IV Morphine- code stroke called and notes state she was obtunded- CTA chest revealed multifocal infiltrates consistent with pneumonia, more hypoxic- vitals> 79/36, temp 100.2, HR 151, RR in 30s,  Narcan given with some improvement in lethargy but ongoing confusion-  MRI ordered but patient unable to cooperate  1/17, pulse ox 85% on room air, HR in 130s- pulm consulted- PT recommends SNF, FL2 done 1/18- MRI attempted- pt did not allow moving to exam table   Subjective: Quite groggy this AM. Daughter states that patient doesn't remember that the daughter spent most of the day with her yesterday. Furthermore, after the IV medications that she received for the MRI yesterday, she became more agitated and confused The patient does wake up easily today but fall asleep easily.She has no complaints.      Assessment & Plan:   Principal Problem:   Closed intertrochanteric fracture of hip, right, initial encounter  - repaired on 1/14- PT recommended SNF  Active Problems:   Acute on chronic respiratory failure with hypoxemia (HCC)    History of Hypersensitivity pneumonitis    Multifocal pneumonia - aspiration vs CAP -  antibiotics started on 1/17- still on Vancomycin and Cefepime - dc Vancomycin- MRSA PCR negative on 1/13 - wean O2 as able- she typically uses  3 L At night at home    Acute metabolic encephalopathy - noted to occur on 1/16 and I suspect it was related to acute infection (pneumonia) with narcotics and hypoxia complicating things - per daughter, the patient was alert and oriented on 1/18- it seems that she became agitated and confused after receiving Ativan to attempt an MRI- will avoid Benzodizepines - this AM she is very sleepy although she last received sedatives (Ativan) yesaterday for the MRI - d/c MRI as at this point I do not think she had a CVA - Using only tramadol PRN for pain which, per report from daughter does not adversely affect her    CMML 1 and thrombocytopenia - no treatment indicated fro CMML per oncology note on 1/18 - given 3 U total of pheresed platelets thus far- platelets are in 70,000 range- use steroids for ITP if platelets < 50K    Chronic diastolic CHF (congestive heart failure) ([place]) - uses daily LAsix 20 mg as outpatient which is on hold  Anemia - baseline Hb ~ 11 - likely acute blood loss due to fracture in setting of AOCD - has received a total of 4 u PRBC so far    DM2 Hemoglobin A1C    Component Value Date/Time   HGBA1C 6.7 (H) 04/30/2020 0334   - cont SSI- holding Amaryl and Metformin     Time spent in minutes: 45 DVT prophylaxis: SCDs Start: 04/30/20 1811  Code Status: Full code Family Communication: daughter at bedside Disposition Plan:  Status is: Inpatient  Remains inpatient appropriate because:IV treatments appropriate due to intensity of  illness or inability to take PO F/u on mental status, HB and platelets wean O2 and cont IV Abx for PNA  Dispo: The patient is from: Home              Anticipated d/c is to: SNF              Anticipated d/c date is: > 3 days              Patient currently is not medically stable to d/c.      Consultants:   Orthopedic surgery  PCCM Procedures:   IM nair of right hip Antimicrobials:  Anti-infectives (From admission, onward)    Start     Dose/Rate Route Frequency Ordered Stop   05/03/20 1400  vancomycin (VANCOREADY) IVPB 1750 mg/350 mL        1,750 mg 175 mL/hr over 120 Minutes Intravenous Every 24 hours 05/02/20 1317     05/02/20 1400  vancomycin (VANCOCIN) 2,500 mg in sodium chloride 0.9 % 500 mL IVPB        2,500 mg 250 mL/hr over 120 Minutes Intravenous  Once 05/02/20 1305 05/02/20 1850   05/02/20 1315  ceFEPIme (MAXIPIME) 2 g in sodium chloride 0.9 % 100 mL IVPB        2 g 200 mL/hr over 30 Minutes Intravenous Every 8 hours 05/02/20 1305     04/30/20 2200  ceFAZolin (ANCEF) IVPB 2g/100 mL premix        2 g 200 mL/hr over 30 Minutes Intravenous Every 8 hours 04/30/20 1810 05/01/20 1443   04/30/20 1438  vancomycin (VANCOCIN) powder  Status:  Discontinued          As needed 04/30/20 1438 04/30/20 1603   04/30/20 0600  ceFAZolin (ANCEF) IVPB 2g/100 mL premix        2 g 200 mL/hr over 30 Minutes Intravenous On call to O.R. 04/29/20 2128 04/30/20 1257       Objective: Vitals:   05/05/20 0335 05/05/20 0758 05/05/20 0808 05/05/20 1111  BP: (!) 113/48 (!) 110/50  118/63  Pulse: (!) 104 (!) 103  96  Resp:  18  18  Temp: 98 F (36.7 C) 99 F (37.2 C)  98.4 F (36.9 C)  TempSrc: Oral Oral  Oral  SpO2: 97% 95% 100% 98%  Weight: 120.4 kg     Height:        Intake/Output Summary (Last 24 hours) at 05/05/2020 1344 Last data filed at 05/05/2020 0603 Gross per 24 hour  Intake 500 ml  Output 2500 ml  Net -2000 ml   Filed Weights   05/02/20 0644 05/04/20 0327 05/05/20 0335  Weight: 117.6 kg 118.4 kg 120.4 kg    Examination: General exam: Appears comfortable - quite sleepy HEENT: PERRLA, oral mucosa moist, no sclera icterus or thrush Respiratory system: Clear to auscultation. Respiratory effort normal. Cardiovascular system: S1 & S2 heard, RRR.   Gastrointestinal system: Abdomen soft, non-tender, nondistended. Normal bowel sounds. Central nervous system:. No focal neurological deficits. Extremities:  No cyanosis, clubbing or edema Skin: No rashes or ulcers Psychiatry:  Difficult to assess     Data Reviewed: I have personally reviewed following labs and imaging studies  CBC: Recent Labs  Lab 04/29/20 1205 04/29/20 2133 05/01/20 0301 05/02/20 0224 05/03/20 0428 05/04/20 0639 05/05/20 0257  WBC 7.0   < > 10.3 13.9* 17.3* 17.6* 17.2*  NEUTROABS 1.6*  --  3.6 6.4  --  10.3* 9.0*  HGB 10.5*   < >  6.9* 7.1* 7.6* 7.5* 7.4*  HCT 32.7*   < > 21.8* 21.0* 21.9* 23.6* 23.8*  MCV 100.6*   < > 96.5 94.6 88.3 94.0 94.4  PLT 30*   < > 47* 65* 68* 77* 76*   < > = values in this interval not displayed.   Basic Metabolic Panel: Recent Labs  Lab 05/01/20 0301 05/02/20 0224 05/03/20 0220 05/03/20 1114 05/04/20 0639 05/05/20 0257  NA 135 137 142  --  137 137  K 4.9 4.6 4.6  --  4.2 4.2  CL 103 106 113*  --  107 109  CO2 24 24 21*  --  22 22  GLUCOSE 178* 194* 165*  --  168* 152*  BUN 27* 14 13  --  10 13  CREATININE 1.38* 0.71 0.68  --  0.52 0.58  CALCIUM 8.6* 8.6* 8.6*  --  8.5* 8.5*  MG 1.7 2.1  --  2.0 1.7 1.6*  PHOS 4.6 2.1*  --  1.4* 2.4* 2.1*   GFR: Estimated Creatinine Clearance: 83.4 mL/min (by C-G formula based on SCr of 0.58 mg/dL). Liver Function Tests: Recent Labs  Lab 05/01/20 0301 05/02/20 0224 05/03/20 1114 05/04/20 0639 05/05/20 0257  AST 32 34 44* 37 43*  ALT _0 ALKPHOS 38 43 46 46 51  BILITOT 1.0 1.2 1.7* 1.1 1.3*  PROT 5.1* 5.3* 5.5* 5.4* 5.4*  ALBUMIN 2.9* 2.6* 2.3* 2.2* 2.1*   No results for input(s): LIPASE, AMYLASE in the last 168 hours. No results for input(s): AMMONIA in the last 168 hours. Coagulation Profile: Recent Labs  Lab 04/29/20 1205  INR 1.3*   Cardiac Enzymes: No results for input(s): CKTOTAL, CKMB, CKMBINDEX, TROPONINI in the last 168 hours. BNP (last 3 results) No results for input(s): PROBNP in the last 8760 hours. HbA1C: No results for input(s): HGBA1C in the last 72 hours. CBG: Recent Labs  Lab  05/04/20 0800 05/04/20 1155 05/04/20 1747 05/05/20 0756 05/05/20 1056  GLUCAP 133* 161* 168* 127* 146*   Lipid Profile: No results for input(s): CHOL, HDL, LDLCALC, TRIG, CHOLHDL, LDLDIRECT in the last 72 hours. Thyroid Function Tests: No results for input(s): TSH, T4TOTAL, FREET4, T3FREE, THYROIDAB in the last 72 hours. Anemia Panel: Recent Labs    05/02/20 1435  FERRITIN 1,485*   Urine analysis:    Component Value Date/Time   COLORURINE YELLOW 05/04/2020 0050   APPEARANCEUR CLEAR 05/04/2020 0050   LABSPEC 1.014 05/04/2020 0050   PHURINE 7.0 05/04/2020 0050   GLUCOSEU NEGATIVE 05/04/2020 0050   HGBUR NEGATIVE 05/04/2020 0050   BILIRUBINUR NEGATIVE 05/04/2020 0050   KETONESUR NEGATIVE 05/04/2020 0050   PROTEINUR NEGATIVE 05/04/2020 0050   UROBILINOGEN 1.0 08/14/2019 1824   NITRITE NEGATIVE 05/04/2020 0050   LEUKOCYTESUR NEGATIVE 05/04/2020 0050   Sepsis Labs: _1 (procalcitonin:4,lacticidven:4) ) Recent Results (from the past 240 hour(s))  Resp Panel by RT-PCR (Flu A&B, Covid) Nasopharyngeal Swab     Status: None   Collection Time: 04/29/20 12:59 PM   Specimen: Nasopharyngeal Swab; Nasopharyngeal(NP) swabs in vial transport medium  Result Value Ref Range Status   SARS Coronavirus 2 by RT PCR NEGATIVE NEGATIVE Final    Comment: (NOTE) SARS-CoV-2 target nucleic acids are NOT DETECTED.  The SARS-CoV-2 RNA is generally detectable in upper respiratory specimens during the acute phase of infection. The lowest concentration of SARS-CoV-2 viral copies this assay can detect is 138 copies/mL. A negative result does not preclude SARS-Cov-2 infection and should not be used as the  sole basis for treatment or other patient management decisions. A negative result may occur with  improper specimen collection/handling, submission of specimen other than nasopharyngeal swab, presence of viral mutation(s) within the areas targeted by this assay, and inadequate number of  viral copies(<138 copies/mL). A negative result must be combined with clinical observations, patient history, and epidemiological information. The expected result is Negative.  Fact Sheet for Patients:  EntrepreneurPulse.com.au  Fact Sheet for Healthcare Providers:  IncredibleEmployment.be  This test is no t yet approved or cleared by the [place] FDA and  has been authorized for detection and/or diagnosis of SARS-CoV-2 by FDA under an Emergency Use Authorization (EUA). This EUA will remain  in effect (meaning this test can be used) for the duration of the COVID-19 declaration under Section 564(b)(1) of the Act, 21 U.S.C.section 360bbb-3(b)(1), unless the authorization is terminated  or revoked sooner.       Influenza A by PCR NEGATIVE NEGATIVE Final   Influenza B by PCR NEGATIVE NEGATIVE Final    Comment: (NOTE) The Xpert Xpress SARS-CoV-2/FLU/RSV plus assay is intended as an aid in the diagnosis of influenza from Nasopharyngeal swab specimens and should not be used as a sole basis for treatment. Nasal washings and aspirates are unacceptable for Xpert Xpress SARS-CoV-2/FLU/RSV testing.  Fact Sheet for Patients: EntrepreneurPulse.com.au  Fact Sheet for Healthcare Providers: IncredibleEmployment.be  This test is not yet approved or cleared by the [place] FDA and has been authorized for detection and/or diagnosis of SARS-CoV-2 by FDA under an Emergency Use Authorization (EUA). This EUA will remain in effect (meaning this test can be used) for the duration of the COVID-19 declaration under Section 564(b)(1) of the Act, 21 U.S.C. section 360bbb-3(b)(1), unless the authorization is terminated or revoked.  Performed at [facility],  [address], [place], [place] [address]   Surgical pcr screen     Status: None   Collection Time: 04/29/20 10:38 PM   Specimen: Nasal Mucosa; Nasal Swab   Result Value Ref Range Status   MRSA, PCR NEGATIVE NEGATIVE Final   Staphylococcus aureus NEGATIVE NEGATIVE Final    Comment: (NOTE) The Xpert SA Assay (FDA approved for NASAL specimens in patients 46 years of age and older), is one component of a comprehensive surveillance program. It is not intended to diagnose infection nor to guide or monitor treatment. Performed at [facility], [place] [address] [place], [place] [address]   Resp Panel by RT-PCR (Flu A&B, Covid) Nasopharyngeal Swab     Status: None   Collection Time: 05/02/20  1:01 PM   Specimen: Nasopharyngeal Swab; Nasopharyngeal(NP) swabs in vial transport medium  Result Value Ref Range Status   SARS Coronavirus 2 by RT PCR NEGATIVE NEGATIVE Final    Comment: (NOTE) SARS-CoV-2 target nucleic acids are NOT DETECTED.  The SARS-CoV-2 RNA is generally detectable in upper respiratory specimens during the acute phase of infection. The lowest concentration of SARS-CoV-2 viral copies this assay can detect is 138 copies/mL. A negative result does not preclude SARS-Cov-2 infection and should not be used as the sole basis for treatment or other patient management decisions. A negative result may occur with  improper specimen collection/handling, submission of specimen other than nasopharyngeal swab, presence of viral mutation(s) within the areas targeted by this assay, and inadequate number of viral copies(<138 copies/mL). A negative result must be combined with clinical observations, patient history, and epidemiological information. The expected result is Negative.  Fact Sheet for Patients:  EntrepreneurPulse.com.au  Fact  Sheet for Healthcare Providers:  IncredibleEmployment.be  This test is no t yet approved or cleared by the [place] FDA and  has been authorized for detection and/or diagnosis of SARS-CoV-2 by FDA under an Emergency Use Authorization (EUA). This EUA will remain  in  effect (meaning this test can be used) for the duration of the COVID-19 declaration under Section 564(b)(1) of the Act, 21 U.S.C.section 360bbb-3(b)(1), unless the authorization is terminated  or revoked sooner.       Influenza A by PCR NEGATIVE NEGATIVE Final   Influenza B by PCR NEGATIVE NEGATIVE Final    Comment: (NOTE) The Xpert Xpress SARS-CoV-2/FLU/RSV plus assay is intended as an aid in the diagnosis of influenza from Nasopharyngeal swab specimens and should not be used as a sole basis for treatment. Nasal washings and aspirates are unacceptable for Xpert Xpress SARS-CoV-2/FLU/RSV testing.  Fact Sheet for Patients: EntrepreneurPulse.com.au  Fact Sheet for Healthcare Providers: IncredibleEmployment.be  This test is not yet approved or cleared by the [place] FDA and has been authorized for detection and/or diagnosis of SARS-CoV-2 by FDA under an Emergency Use Authorization (EUA). This EUA will remain in effect (meaning this test can be used) for the duration of the COVID-19 declaration under Section 564(b)(1) of the Act, 21 U.S.C. section 360bbb-3(b)(1), unless the authorization is terminated or revoked.  Performed at [facility], [place] [address] [place], [place] [address]   Respiratory (~20 pathogens) panel by PCR     Status: None   Collection Time: 05/02/20  1:01 PM   Specimen: Nasopharyngeal Swab; Respiratory  Result Value Ref Range Status   Adenovirus NOT DETECTED NOT DETECTED Final   Coronavirus 229E NOT DETECTED NOT DETECTED Final    Comment: (NOTE) The Coronavirus on the Respiratory Panel, DOES NOT test for the novel  Coronavirus (2019 nCoV)    Coronavirus HKU1 NOT DETECTED NOT DETECTED Final   Coronavirus NL63 NOT DETECTED NOT DETECTED Final   Coronavirus OC43 NOT DETECTED NOT DETECTED Final   Metapneumovirus NOT DETECTED NOT DETECTED Final   Rhinovirus / Enterovirus NOT DETECTED NOT DETECTED Final    Influenza A NOT DETECTED NOT DETECTED Final   Influenza B NOT DETECTED NOT DETECTED Final   Parainfluenza Virus 1 NOT DETECTED NOT DETECTED Final   Parainfluenza Virus 2 NOT DETECTED NOT DETECTED Final   Parainfluenza Virus 3 NOT DETECTED NOT DETECTED Final   Parainfluenza Virus 4 NOT DETECTED NOT DETECTED Final   Respiratory Syncytial Virus NOT DETECTED NOT DETECTED Final   Bordetella pertussis NOT DETECTED NOT DETECTED Final   Bordetella Parapertussis NOT DETECTED NOT DETECTED Final   Chlamydophila pneumoniae NOT DETECTED NOT DETECTED Final   Mycoplasma pneumoniae NOT DETECTED NOT DETECTED Final    Comment: Performed at [facility], [place]. [address], [place], [place] [address]  Culture, blood (routine x 2)     Status: None (Preliminary result)   Collection Time: 05/02/20  2:15 PM   Specimen: BLOOD  Result Value Ref Range Status   Specimen Description BLOOD LEFT ANTECUBITAL  Final   Special Requests   Final    BOTTLES DRAWN AEROBIC AND ANAEROBIC Blood Culture results may not be optimal due to an inadequate volume of blood received in culture bottles   Culture   Final    NO GROWTH 3 DAYS Performed at [facility], [place] [address] [place], [place] [address]    Report Status PENDING  Incomplete  Culture, blood (routine x 2)  Status: None (Preliminary result)   Collection Time: 05/02/20  2:31 PM   Specimen: BLOOD LEFT HAND  Result Value Ref Range Status   Specimen Description BLOOD LEFT HAND  Final   Special Requests   Final    BOTTLES DRAWN AEROBIC ONLY Blood Culture adequate volume   Culture   Final    NO GROWTH 3 DAYS Performed at [facility], [address] [place], [place] [address]    Report Status PENDING  Incomplete         Radiology Studies: DG CHEST PORT 1 VIEW  Result Date: 05/05/2020 CLINICAL DATA:  Shortness of breath. EXAM: PORTABLE CHEST 1 VIEW COMPARISON:  05/03/2020. FINDINGS: Cardiomegaly. Diffuse bilateral pulmonary interstitial  edema/infiltrates, progressed from prior exam. No pleural effusion or pneumothorax. No acute bony abnormality. IMPRESSION: 1. Cardiomegaly. 2. Diffuse bilateral pulmonary interstitial edema/infiltrates, progressed from prior exam. Electronically Signed   By: [place]   On: 05/05/2020 06:25   VAS [place] LOWER EXTREMITY VENOUS (DVT)  Result Date: 05/04/2020  Lower Venous DVT Study Indications: Edema.  Limitations: Poor ultrasound/tissue interface and body habitus. Comparison Study: 11/09/19 previous Performing Technologist: Abram Sander RVS  Examination Guidelines: A complete evaluation includes B-mode imaging, spectral Doppler, color Doppler, and power Doppler as needed of all accessible portions of each vessel. Bilateral testing is considered an integral part of a complete examination. Limited examinations for reoccurring indications may be performed as noted. The reflux portion of the exam is performed with the patient in reverse Trendelenburg.  +---------+---------------+---------+-----------+----------+--------------+ RIGHT    CompressibilityPhasicitySpontaneityPropertiesThrombus Aging +---------+---------------+---------+-----------+----------+--------------+ CFV      Full           Yes      Yes                                 +---------+---------------+---------+-----------+----------+--------------+ SFJ      Full                                                        +---------+---------------+---------+-----------+----------+--------------+ FV Prox  Full                                                        +---------+---------------+---------+-----------+----------+--------------+ FV Mid                  Yes      Yes                                 +---------+---------------+---------+-----------+----------+--------------+ FV Distal               Yes      Yes                                  +---------+---------------+---------+-----------+----------+--------------+ PFV      Full                                                        +---------+---------------+---------+-----------+----------+--------------+  POP      Full           Yes      Yes                                 +---------+---------------+---------+-----------+----------+--------------+ PTV      Full                                                        +---------+---------------+---------+-----------+----------+--------------+ PERO     Full                                                        +---------+---------------+---------+-----------+----------+--------------+   +---------+---------------+---------+-----------+----------+--------------+ LEFT     CompressibilityPhasicitySpontaneityPropertiesThrombus Aging +---------+---------------+---------+-----------+----------+--------------+ CFV      Full           Yes      Yes                                 +---------+---------------+---------+-----------+----------+--------------+ SFJ      Full                                                        +---------+---------------+---------+-----------+----------+--------------+ FV Prox  Full                                                        +---------+---------------+---------+-----------+----------+--------------+ FV Mid                  Yes      Yes                                 +---------+---------------+---------+-----------+----------+--------------+ FV Distal               Yes      Yes                                 +---------+---------------+---------+-----------+----------+--------------+ PFV      Full                                                        +---------+---------------+---------+-----------+----------+--------------+ POP      Full           Yes      Yes                                  +---------+---------------+---------+-----------+----------+--------------+  PTV      Full                                                        +---------+---------------+---------+-----------+----------+--------------+ PERO     Full                                                        +---------+---------------+---------+-----------+----------+--------------+     Summary: BILATERAL: - No evidence of deep vein thrombosis seen in the lower extremities, bilaterally. - No evidence of superficial venous thrombosis in the lower extremities, bilaterally. -No evidence of popliteal cyst, bilaterally.   *See table(s) above for measurements and observations. Electronically signed by Deitra Mayo MD on 05/04/2020 at 1:40:27 PM.    Final       Scheduled Meds: . sodium chloride   Intravenous Once  . sodium chloride   Intravenous Once  . aspirin EC  81 mg Oral Daily  . diphenhydrAMINE  25 mg Intravenous Once  . docusate sodium  100 mg Oral BID  . famotidine  40 mg Oral Daily  . insulin aspart  0-9 Units Subcutaneous TID WC  . ipratropium  0.5 mg Nebulization BID  . levalbuterol  0.63 mg Nebulization BID  . metoprolol succinate  75 mg Oral Daily  . montelukast  10 mg Oral QHS  . oxybutynin  5 mg Oral TID  . pantoprazole  40 mg Oral Daily  . potassium & sodium phosphates  1 packet Oral TID WC & HS  . pravastatin  40 mg Oral QHS  . Ensure Max Protein  11 oz Oral TID  . senna-docusate  1 tablet Oral Q0600   Continuous Infusions: . sodium chloride    . ceFEPime (MAXIPIME) IV 2 g (05/05/20 0603)  . sodium chloride    . vancomycin 1,750 mg (05/04/20 1508)     LOS: 6 days      Debbe Odea, MD [facility] Pager: [email or web address] 05/05/2020, 1:44 PM

## 2020-05-05 NOTE — Progress Notes (Signed)
Physical Therapy Treatment Patient Details Name: Kathy Irwin MRN: [id number] DOB: [birth year] Today's Date: 05/05/2020    History of Present Illness 70 yo female with mechanical fall was admitted for IM nailing of her R intertrochanteric fracture. 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    PT Comments    Pt sleeping upon arrival, but wakes easily when PT enters room. Pt requiring max-total +2 assist for bed mobility and transfer to standing today. Today was pt's first successful stand attempt, tolerating ~10 seconds and limited by R hip pain. PT instructed pt in LE exercises to promote LE circulation and maintain LE strength, pt tolerated well. PT to continue to follow acutely.     Follow Up Recommendations  SNF     Equipment Recommendations  None recommended by PT    Recommendations for Other Services       Precautions / Restrictions Precautions Precautions: Fall Restrictions Weight Bearing Restrictions: No RLE Weight Bearing: Weight bearing as tolerated    Mobility  Bed Mobility Overal bed mobility: Needs Assistance Bed Mobility: Supine to Sit;Sit to Supine Rolling: Max assist   Supine to sit: Total assist;+2 for physical assistance Sit to supine: Total assist;+2 for physical assistance   General bed mobility comments: total +2 for helicopter method to and from EOB, PT supporting LEs and PT aide supporting pt trunk. Boost assist for repositioning upon return to supine.  Transfers Overall transfer level: Needs assistance Equipment used: Rolling walker (2 wheeled) Transfers: Sit to/from Stand Sit to Stand: Max assist;+2 physical assistance;+2 safety/equipment;From elevated surface         General transfer comment: max +2 for power up, rise, hip extension via posterior facilitation, and steadying upon standing. STS x2 attempts, one successful x10 seconds. anterior rocking utilized for momentum building  Ambulation/Gait              General Gait Details: Scientific laboratory technician Rankin (Stroke Patients Only)       Balance Overall balance assessment: Needs assistance Sitting-balance support: Single extremity supported;Feet unsupported Sitting balance-Leahy Scale: Poor Sitting balance - Comments: L lateral leaning requiring mod assist to correct, HOB elevated to prevent pt from laying back down. Postural control: Left lateral lean Standing balance support: Bilateral upper extremity supported;During functional activity Standing balance-Leahy Scale: Zero Standing balance comment: max +2                            Cognition Arousal/Alertness: Awake/alert Behavior During Therapy: Flat affect Overall Cognitive Status: Impaired/Different from baseline Area of Impairment: Awareness;Problem solving;Following commands;Attention                   Current Attention Level: Selective   Following Commands: Follows one step commands inconsistently;Follows one step commands with increased time Safety/Judgement: Decreased awareness of safety;Decreased awareness of deficits Awareness: Intellectual Problem Solving: Slow processing;Requires verbal cues;Requires tactile cues;Decreased initiation;Difficulty sequencing General Comments: Pt requires multimodal cuing for mobility tasks, and repeated cuing throughout. Step-by-step sequencing assist needed. Pt aware of hip surgery today, stating "this rod is heavy"      Exercises General Exercises - Lower Extremity Ankle Circles/Pumps: AROM;Both;5 reps;Supine Short Arc Quad: AAROM;Both;10 reps;Supine    General Comments General comments (skin integrity, edema, etc.): SpO2 83% on RA during mobility, placed back on 3LO2 with recovery of sats 88-95%. Per pt's  daughter, pt only wears O2 at night at baseline (?)      Pertinent Vitals/Pain Pain Assessment: 0-10 Pain Score: 7  Pain Location: R thigh and hip; "I can feel  the rod" Pain Descriptors / Indicators: Operative site guarding;Grimacing;Guarding Pain Intervention(s): Limited activity within patient's tolerance;Monitored during session;Repositioned    Home Living                      Prior Function            PT Goals (current goals can now be found in the care plan section) Acute Rehab PT Goals Patient Stated Goal: to get home PT Goal Formulation: With patient/family Time For Goal Achievement: 05/15/20 Potential to Achieve Goals: Good Progress towards PT goals: Progressing toward goals    Frequency    Min 3X/week      PT Plan Current plan remains appropriate    Co-evaluation              AM-PAC PT "6 Clicks" Mobility   Outcome Measure  Help needed turning from your back to your side while in a flat bed without using bedrails?: A Lot Help needed moving from lying on your back to sitting on the side of a flat bed without using bedrails?: Total Help needed moving to and from a bed to a chair (including a wheelchair)?: Total Help needed standing up from a chair using your arms (e.g., wheelchair or bedside chair)?: Total Help needed to walk in hospital room?: Total Help needed climbing 3-5 steps with a railing? : Total 6 Click Score: 7    End of Session Equipment Utilized During Treatment: Oxygen Activity Tolerance: Patient limited by fatigue;Patient limited by pain Patient left: in bed;with call bell/phone within reach;with bed alarm set;Other (comment) (egress position) Nurse Communication: Mobility status;Patient requests pain meds PT Visit Diagnosis: Muscle weakness (generalized) (M62.81);Pain;History of falling (Z91.81) Pain - Right/Left: Right Pain - part of body: Hip     Time: 2449-7530 PT Time Calculation (min) (ACUTE ONLY): 26 min  Charges:  $Therapeutic Activity: 23-37 mins                     Stacie Glaze, PT Acute Rehabilitation Services Pager [phone]  Office [phone]   Tustin E  Ruffin Pyo 05/05/2020, 3:10 PM

## 2020-05-05 NOTE — Telephone Encounter (Signed)
Yes. Thanks

## 2020-05-05 NOTE — Plan of Care (Signed)
  Problem: Nutrition: Goal: Adequate nutrition will be maintained Outcome: Progressing   Problem: Coping: Goal: Level of anxiety will decrease Outcome: Progressing

## 2020-05-05 NOTE — Telephone Encounter (Signed)
Left message for patient to call back. It looks like patient is still in the hospital. Next 30 min slot is not until March 2022.

## 2020-05-06 DIAGNOSIS — G9341 Metabolic encephalopathy: Secondary | ICD-10-CM | POA: Diagnosis not present

## 2020-05-06 DIAGNOSIS — S72141A Displaced intertrochanteric fracture of right femur, initial encounter for closed fracture: Secondary | ICD-10-CM | POA: Diagnosis not present

## 2020-05-06 DIAGNOSIS — J9621 Acute and chronic respiratory failure with hypoxia: Secondary | ICD-10-CM | POA: Diagnosis not present

## 2020-05-06 DIAGNOSIS — I5032 Chronic diastolic (congestive) heart failure: Secondary | ICD-10-CM | POA: Diagnosis not present

## 2020-05-06 LAB — CBC
HCT: 23.9 % — ABNORMAL LOW (ref 36.0–46.0)
Hemoglobin: 7.3 g/dL — ABNORMAL LOW (ref 12.0–15.0)
MCH: 29 pg (ref 26.0–34.0)
MCHC: 30.5 g/dL (ref 30.0–36.0)
MCV: 94.8 fL (ref 80.0–100.0)
Platelets: 67 10*3/uL — ABNORMAL LOW (ref 150–400)
RBC: 2.52 MIL/uL — ABNORMAL LOW (ref 3.87–5.11)
RDW: 19.9 % — ABNORMAL HIGH (ref 11.5–15.5)
WBC: 16.6 10*3/uL — ABNORMAL HIGH (ref 4.0–10.5)
nRBC: 7.6 % — ABNORMAL HIGH (ref 0.0–0.2)

## 2020-05-06 LAB — GLUCOSE, CAPILLARY
Glucose-Capillary: 117 mg/dL — ABNORMAL HIGH (ref 70–99)
Glucose-Capillary: 127 mg/dL — ABNORMAL HIGH (ref 70–99)
Glucose-Capillary: 123 mg/dL — ABNORMAL HIGH (ref 70–99)
Glucose-Capillary: 191 mg/dL — ABNORMAL HIGH (ref 70–99)

## 2020-05-06 LAB — BASIC METABOLIC PANEL
Anion gap: 8 (ref 5–15)
BUN: 9 mg/dL (ref 8–23)
CO2: 22 mmol/L (ref 22–32)
Calcium: 8.4 mg/dL — ABNORMAL LOW (ref 8.9–10.3)
Chloride: 107 mmol/L (ref 98–111)
Creatinine, Ser: 0.5 mg/dL (ref 0.44–1.00)
GFR, Estimated: >60 mL/min (ref >60)
Glucose, Bld: 139 mg/dL — ABNORMAL HIGH (ref 70–99)
Potassium: 4.2 mmol/L (ref 3.5–5.1)
Sodium: 137 mmol/L (ref 135–145)

## 2020-05-06 MED ORDER — FUROSEMIDE 10 MG/ML IJ SOLN
40.0000 mg | Freq: Two times a day (BID) | INTRAMUSCULAR | Status: DC
  Administered 2020-05-06 – 2020-05-10 (×8): 40 mg via INTRAVENOUS
  Filled 2020-05-06 (×9): qty 4

## 2020-05-06 NOTE — Progress Notes (Signed)
Orthopaedic Trauma Progress Note  S: Doing well today, appears more awake and alert.  Daughter at bedside.  Asking how long patient will have to remain in hospital and when she can go to rehab facility.  O:  Vitals:   05/06/20 0851 05/06/20 1112  BP: (!) 123/56 114/60  Pulse: 97 92  Resp:  (!) 28  Temp:  97.9 F (36.6 C)  SpO2:  100%   General: Awake and alert, NAD Respiratory: No increased work of breathing at rest RLE: Swelling present.  Dressings removed, incisions are clean, dry, intact with Dermabond in place.  No significant tenderness with palpation throughout thigh. Ankle dorsiflexion/plantarflexion intact. Neurovascularly intact  Imaging: Stable postop imaging.   Labs:  Results for orders placed or performed during the hospital encounter of 04/29/20 (from the past 24 hour(s))  Glucose, capillary     Status: Abnormal   Collection Time: 05/05/20  4:40 PM  Result Value Ref Range   Glucose-Capillary 215 (H) 70 - 99 mg/dL  Glucose, capillary     Status: Abnormal   Collection Time: 05/05/20  9:57 PM  Result Value Ref Range   Glucose-Capillary 156 (H) 70 - 99 mg/dL  CBC     Status: Abnormal   Collection Time: 05/06/20  2:21 AM  Result Value Ref Range   WBC 16.6 (H) 4.0 - 10.5 K/uL   RBC 2.52 (L) 3.87 - 5.11 MIL/uL   Hemoglobin 7.3 (L) 12.0 - 15.0 g/dL   HCT 23.9 (L) 36.0 - 46.0 %   MCV 94.8 80.0 - 100.0 fL   MCH 29.0 26.0 - 34.0 pg   MCHC 30.5 30.0 - 36.0 g/dL   RDW 19.9 (H) 11.5 - 15.5 %   Platelets 67 (L) 150 - 400 K/uL   nRBC 7.6 (H) 0.0 - 0.2 %  Basic metabolic panel     Status: Abnormal   Collection Time: 05/06/20  2:21 AM  Result Value Ref Range   Sodium 137 135 - 145 mmol/L   Potassium 4.2 3.5 - 5.1 mmol/L   Chloride 107 98 - 111 mmol/L   CO2 22 22 - 32 mmol/L   Glucose, Bld 139 (H) 70 - 99 mg/dL   BUN 9 8 - 23 mg/dL   Creatinine, Ser 0.50 0.44 - 1.00 mg/dL   Calcium 8.4 (L) 8.9 - 10.3 mg/dL   GFR, Estimated >60 >60 mL/min   Anion gap 8 5 - 15   Glucose, capillary     Status: Abnormal   Collection Time: 05/06/20  8:05 AM  Result Value Ref Range   Glucose-Capillary 117 (H) 70 - 99 mg/dL  Glucose, capillary     Status: Abnormal   Collection Time: 05/06/20 12:01 PM  Result Value Ref Range   Glucose-Capillary 127 (H) 70 - 99 mg/dL    Assessment: 70 year old female s/p fall w/ left hip fracture s/p IMN on 04/30/20   Weightbearing: WBAT RLE  Insicional and dressing care: Dressings removed today, okay to leave open to air.  Patient orthopedic device(s):None needed  CV/Blood loss: 7.3 this morning.  Has had 3 units PRBCs and 3 units platelets in total  Pain management: Avoid narcotics for now. 1. Tylenol 1000 q 8 hours scheduled 2. Toradol 15 mg IV q 6 hours PRN 3. Tramadol 50 mg q 6 hours PRN   VTE prophylaxis: Currently on 81 mg ASA for VTE prophylaxis. Have not started anything more aggressive due to continued anemia.   ID: per hospitalist. On Vanc and  cefepime now  Medical co-morbidities: per hospitalist  Dispo: Therapies as tolerated. PT/OT recommending SNF.  Patient stable for discharge from Ortho standpoint.  Follow - up plan: Continue to follow in hospital. Will need outpatient follow up in 2-3 weeks post discharge   Olivene Cookston A. Carmie Kanner [facility] [phone] (office) [email or web address]

## 2020-05-06 NOTE — Progress Notes (Signed)
Attempted to wean O2 down to room air.  Dropped to 82%.  Applied O2 3L via [place].  Pt is now satting 95% on O2.3L via [place].

## 2020-05-06 NOTE — Progress Notes (Signed)
PROGRESS NOTE    Kathy Irwin   TDH:[id number]  DOB: April 18, 1950  DOA: 04/29/2020 PCP: Kerin Perna, NP   Brief Narrative:  Kathy Irwin is a 70 y.o.femalewith medical history significant ofHTN, interstitial lung disease (hypersensitivity pneumonitis),chronic respiratory failure with hypoxia on 2 L at night,DM type II, ITP,CMML-1,and obesity presents after having a mechanicl fall while at work and right hip pain.   In ED > subtrochanteric fracture of right femur RR in 30s Platelets in 30s.   1/14 underwent IM nail/ percutaneous fixation of femoral neck fractuire 1/15 Hb 6.9 from 8.9 1/16- increasingly lethargic after IV Morphine- code stroke called and notes state she was obtunded- CTA chest revealed multifocal infiltrates consistent with pneumonia, more hypoxic- vitals> 79/36, temp 100.2, HR 151, RR in 30s,  Narcan given with some improvement in lethargy but ongoing confusion-  MRI ordered but patient unable to cooperate  1/17, pulse ox 85% on room air, HR in 130s- pulm consulted- PT recommends SNF, FL2 done 1/18- MRI attempted- pt did not allow moving to exam table   Subjective: Alert and oriented today. She has complaints of pain in her right hip and leg.     Assessment & Plan:   Principal Problem:   Closed intertrochanteric fracture of hip, right, initial encounter  - repaired on 1/14- PT recommended SNF  Active Problems:   Acute on chronic respiratory failure with hypoxemia (HCC)    History of Hypersensitivity pneumonitis     Multifocal pneumonia    Acute on chronic diastolic CHF - aspiration vs CAP -  antibiotics started on 1/17>  Vancomycin and Cefepime - d/c Vancomycin on 1/19- MRSA PCR negative on 1/13 - wean O2 as able- she typically uses 3 L At night at home - start lasix for pulmonary edema noted on CXR and on exam  - follow electrolytes and Cr and wean O2 as able    Acute metabolic encephalopathy - noted to occur on 1/16 and I suspect it was  related to acute infection (pneumonia) with narcotics and hypoxia complicating things - per daughter, the patient was alert and oriented on 1/18- it seems that she became agitated and confused after receiving Ativan to attempt an MRI- will avoid Benzodizepines - this AM she is very sleepy although she last received sedatives (Ativan) yesaterday for the MRI - d/c MRI as at this point I do not think she had a CVA - Using only Tramadol PRN for pain which, per report from daughter does not adversely affect her - awake and alert now    CMML 1 and thrombocytopenia - no treatment indicated fro CMML per oncology note on 1/18 - given 3 U total of pheresed platelets thus far- platelets are in 70,000 range- use steroids for ITP if platelets < 50K   Anemia - baseline Hb ~ 11 - likely acute blood loss due to fracture in setting of AOCD - has received a total of 4 u PRBC so far - Hb remaining steady at 7-8 range    DM2    Component Value Date/Time   HGBA1C 6.7 (H) 04/30/2020 0334  - cont SSI- holding Amaryl and Metformin     Time spent in minutes: 45 DVT prophylaxis: SCDs Start: 04/30/20 1811 Code Status: Full code Family Communication: daughter at bedside Disposition Plan:  Status is: Inpatient  Remains inpatient appropriate because:IV treatments appropriate due to intensity of illness or inability to take PO   Dispo: The patient is from: Home  Anticipated d/c is to: SNF              Anticipated d/c date is: > 3 days              Patient currently is not medically stable to d/c.  Consultants:   Orthopedic surgery  PCCM Procedures:   IM nair of right hip Antimicrobials:  Anti-infectives (From admission, onward)   Start     Dose/Rate Route Frequency Ordered Stop   05/03/20 1400  vancomycin (VANCOREADY) IVPB 1750 mg/350 mL  Status:  Discontinued        1,750 mg 175 mL/hr over 120 Minutes Intravenous Every 24 hours 05/02/20 1317 05/05/20 1347   05/02/20 1400   vancomycin (VANCOCIN) 2,500 mg in sodium chloride 0.9 % 500 mL IVPB        2,500 mg 250 mL/hr over 120 Minutes Intravenous  Once 05/02/20 1305 05/02/20 1850   05/02/20 1315  ceFEPIme (MAXIPIME) 2 g in sodium chloride 0.9 % 100 mL IVPB        2 g 200 mL/hr over 30 Minutes Intravenous Every 8 hours 05/02/20 1305     04/30/20 2200  ceFAZolin (ANCEF) IVPB 2g/100 mL premix        2 g 200 mL/hr over 30 Minutes Intravenous Every 8 hours 04/30/20 1810 05/01/20 1443   04/30/20 1438  vancomycin (VANCOCIN) powder  Status:  Discontinued          As needed 04/30/20 1438 04/30/20 1603   04/30/20 0600  ceFAZolin (ANCEF) IVPB 2g/100 mL premix        2 g 200 mL/hr over 30 Minutes Intravenous On call to O.R. 04/29/20 2128 04/30/20 1257       Objective: Vitals:   05/06/20 0851 05/06/20 1112 05/06/20 1246 05/06/20 1249  BP: (!) 123/56 114/60    Pulse: 97 92 91 88  Resp:  (!) 28 (!) 34 (!) 31  Temp:  97.9 F (36.6 C)    TempSrc:  Oral    SpO2:  100% (!) 79% 92%  Weight:      Height:        Intake/Output Summary (Last 24 hours) at 05/06/2020 1256 Last data filed at 05/06/2020 1100 Gross per 24 hour  Intake 855 ml  Output 4300 ml  Net -3445 ml   Filed Weights   05/05/20 0335 05/06/20 0308 05/06/20 0509  Weight: 120.4 kg 120.4 kg 119.7 kg    Examination: General exam: Appears comfortable - quite sleepy HEENT: PERRLA, oral mucosa moist, no sclera icterus or thrush Respiratory system: Clear to auscultation. Respiratory effort normal. Cardiovascular system: S1 & S2 heard, RRR.   Gastrointestinal system: Abdomen soft, non-tender, nondistended. Normal bowel sounds. Central nervous system:. No focal neurological deficits. Extremities: No cyanosis, clubbing or edema Skin: No rashes or ulcers Psychiatry:  Difficult to assess     Data Reviewed: I have personally reviewed following labs and imaging studies  CBC: Recent Labs  Lab 05/01/20 0301 05/02/20 0224 05/03/20 0428 05/04/20 0639  05/05/20 0257 05/06/20 0221  WBC 10.3 13.9* 17.3* 17.6* 17.2* 16.6*  NEUTROABS 3.6 6.4  --  10.3* 9.0*  --   HGB 6.9* 7.1* 7.6* 7.5* 7.4* 7.3*  HCT 21.8* 21.0* 21.9* 23.6* 23.8* 23.9*  MCV 96.5 94.6 88.3 94.0 94.4 94.8  PLT 47* 65* 68* 77* 76* 67*   Basic Metabolic Panel: Recent Labs  Lab 05/01/20 0301 05/02/20 0224 05/03/20 0220 05/03/20 1114 05/04/20 0639 05/05/20 0257 05/06/20 0221  NA 135 137 142  --  137 137 137  K 4.9 4.6 4.6  --  4.2 4.2 4.2  CL 103 106 113*  --  107 109 107  CO2 24 24 21*  --  _0 GLUCOSE 178* 194* 165*  --  168* 152* 139*  BUN 27* 14 13  --  _1 CREATININE 1.38* 0.71 0.68  --  0.52 0.58 0.50  CALCIUM 8.6* 8.6* 8.6*  --  8.5* 8.5* 8.4*  MG 1.7 2.1  --  2.0 1.7 1.6*  --   PHOS 4.6 2.1*  --  1.4* 2.4* 2.1*  --    GFR: Estimated Creatinine Clearance: 83.1 mL/min (by C-G formula based on SCr of 0.5 mg/dL). Liver Function Tests: Recent Labs  Lab 05/01/20 0301 05/02/20 0224 05/03/20 1114 05/04/20 0639 05/05/20 0257  AST 32 34 44* 37 43*  ALT _2 ALKPHOS 38 43 46 46 51  BILITOT 1.0 1.2 1.7* 1.1 1.3*  PROT 5.1* 5.3* 5.5* 5.4* 5.4*  ALBUMIN 2.9* 2.6* 2.3* 2.2* 2.1*   No results for input(s): LIPASE, AMYLASE in the last 168 hours. No results for input(s): AMMONIA in the last 168 hours. Coagulation Profile: No results for input(s): INR, PROTIME in the last 168 hours. Cardiac Enzymes: No results for input(s): CKTOTAL, CKMB, CKMBINDEX, TROPONINI in the last 168 hours. BNP (last 3 results) No results for input(s): PROBNP in the last 8760 hours. HbA1C: No results for input(s): HGBA1C in the last 72 hours. CBG: Recent Labs  Lab 05/05/20 1056 05/05/20 1640 05/05/20 2157 05/06/20 0805 05/06/20 1201  GLUCAP 146* 215* 156* 117* 127*   Lipid Profile: No results for input(s): CHOL, HDL, LDLCALC, TRIG, CHOLHDL, LDLDIRECT in the last 72 hours. Thyroid Function Tests: No results for input(s): TSH, T4TOTAL, FREET4, T3FREE,  THYROIDAB in the last 72 hours. Anemia Panel: No results for input(s): VITAMINB12, FOLATE, FERRITIN, TIBC, IRON, RETICCTPCT in the last 72 hours. Urine analysis:    Component Value Date/Time   COLORURINE YELLOW 05/04/2020 0050   APPEARANCEUR CLEAR 05/04/2020 0050   LABSPEC 1.014 05/04/2020 0050   PHURINE 7.0 05/04/2020 0050   GLUCOSEU NEGATIVE 05/04/2020 0050   HGBUR NEGATIVE 05/04/2020 0050   BILIRUBINUR NEGATIVE 05/04/2020 0050   KETONESUR NEGATIVE 05/04/2020 0050   PROTEINUR NEGATIVE 05/04/2020 0050   UROBILINOGEN 1.0 08/14/2019 1824   NITRITE NEGATIVE 05/04/2020 0050   LEUKOCYTESUR NEGATIVE 05/04/2020 0050   Sepsis Labs: _3 (procalcitonin:4,lacticidven:4) ) Recent Results (from the past 240 hour(s))  Resp Panel by RT-PCR (Flu A&B, Covid) Nasopharyngeal Swab     Status: None   Collection Time: 04/29/20 12:59 PM   Specimen: Nasopharyngeal Swab; Nasopharyngeal(NP) swabs in vial transport medium  Result Value Ref Range Status   SARS Coronavirus 2 by RT PCR NEGATIVE NEGATIVE Final    Comment: (NOTE) SARS-CoV-2 target nucleic acids are NOT DETECTED.  The SARS-CoV-2 RNA is generally detectable in upper respiratory specimens during the acute phase of infection. The lowest concentration of SARS-CoV-2 viral copies this assay can detect is 138 copies/mL. A negative result does not preclude SARS-Cov-2 infection and should not be used as the sole basis for treatment or other patient management decisions. A negative result may occur with  improper specimen collection/handling, submission of specimen other than nasopharyngeal swab, presence of viral mutation(s) within the areas targeted by this assay, and inadequate number of viral copies(<138 copies/mL). A negative result must be combined with clinical observations, patient history, and epidemiological information. The expected result is Negative.  Fact Sheet for Patients:  EntrepreneurPulse.com.au  Fact  Sheet for Healthcare Providers:  IncredibleEmployment.be  This test is no t yet approved or cleared by the [place] FDA and  has been authorized for detection and/or diagnosis of SARS-CoV-2 by FDA under an Emergency Use Authorization (EUA). This EUA will remain  in effect (meaning this test can be used) for the duration of the COVID-19 declaration under Section 564(b)(1) of the Act, 21 U.S.C.section 360bbb-3(b)(1), unless the authorization is terminated  or revoked sooner.       Influenza A by PCR NEGATIVE NEGATIVE Final   Influenza B by PCR NEGATIVE NEGATIVE Final    Comment: (NOTE) The Xpert Xpress SARS-CoV-2/FLU/RSV plus assay is intended as an aid in the diagnosis of influenza from Nasopharyngeal swab specimens and should not be used as a sole basis for treatment. Nasal washings and aspirates are unacceptable for Xpert Xpress SARS-CoV-2/FLU/RSV testing.  Fact Sheet for Patients: EntrepreneurPulse.com.au  Fact Sheet for Healthcare Providers: IncredibleEmployment.be  This test is not yet approved or cleared by the [place] FDA and has been authorized for detection and/or diagnosis of SARS-CoV-2 by FDA under an Emergency Use Authorization (EUA). This EUA will remain in effect (meaning this test can be used) for the duration of the COVID-19 declaration under Section 564(b)(1) of the Act, 21 U.S.C. section 360bbb-3(b)(1), unless the authorization is terminated or revoked.  Performed at [facility], [place] [address], [place], [place] [address]   Surgical pcr screen     Status: None   Collection Time: 04/29/20 10:38 PM   Specimen: Nasal Mucosa; Nasal Swab  Result Value Ref Range Status   MRSA, PCR NEGATIVE NEGATIVE Final   Staphylococcus aureus NEGATIVE NEGATIVE Final    Comment: (NOTE) The Xpert SA Assay (FDA approved for NASAL specimens in patients 83 years of age and older), is one component of a  comprehensive surveillance program. It is not intended to diagnose infection nor to guide or monitor treatment. Performed at [facility], [place] [address], [place], [place] [address]   Resp Panel by RT-PCR (Flu A&B, Covid) Nasopharyngeal Swab     Status: None   Collection Time: 05/02/20  1:01 PM   Specimen: Nasopharyngeal Swab; Nasopharyngeal(NP) swabs in vial transport medium  Result Value Ref Range Status   SARS Coronavirus 2 by RT PCR NEGATIVE NEGATIVE Final    Comment: (NOTE) SARS-CoV-2 target nucleic acids are NOT DETECTED.  The SARS-CoV-2 RNA is generally detectable in upper respiratory specimens during the acute phase of infection. The lowest concentration of SARS-CoV-2 viral copies this assay can detect is 138 copies/mL. A negative result does not preclude SARS-Cov-2 infection and should not be used as the sole basis for treatment or other patient management decisions. A negative result may occur with  improper specimen collection/handling, submission of specimen other than nasopharyngeal swab, presence of viral mutation(s) within the areas targeted by this assay, and inadequate number of viral copies(<138 copies/mL). A negative result must be combined with clinical observations, patient history, and epidemiological information. The expected result is Negative.  Fact Sheet for Patients:  EntrepreneurPulse.com.au  Fact Sheet for Healthcare Providers:  IncredibleEmployment.be  This test is no t yet approved or cleared by the [place] FDA and  has been authorized for detection and/or diagnosis of SARS-CoV-2 by FDA under an Emergency Use Authorization (EUA). This EUA will remain  in effect (meaning this test can be used) for the duration of the COVID-19 declaration under Section 564(b)(1) of  the Act, 21 U.S.C.section 360bbb-3(b)(1), unless the authorization is terminated  or revoked sooner.       Influenza A by PCR NEGATIVE  NEGATIVE Final   Influenza B by PCR NEGATIVE NEGATIVE Final    Comment: (NOTE) The Xpert Xpress SARS-CoV-2/FLU/RSV plus assay is intended as an aid in the diagnosis of influenza from Nasopharyngeal swab specimens and should not be used as a sole basis for treatment. Nasal washings and aspirates are unacceptable for Xpert Xpress SARS-CoV-2/FLU/RSV testing.  Fact Sheet for Patients: EntrepreneurPulse.com.au  Fact Sheet for Healthcare Providers: IncredibleEmployment.be  This test is not yet approved or cleared by the [place] FDA and has been authorized for detection and/or diagnosis of SARS-CoV-2 by FDA under an Emergency Use Authorization (EUA). This EUA will remain in effect (meaning this test can be used) for the duration of the COVID-19 declaration under Section 564(b)(1) of the Act, 21 U.S.C. section 360bbb-3(b)(1), unless the authorization is terminated or revoked.  Performed at [facility], [place] [address] [place], [place] [address]   Respiratory (~20 pathogens) panel by PCR     Status: None   Collection Time: 05/02/20  1:01 PM   Specimen: Nasopharyngeal Swab; Respiratory  Result Value Ref Range Status   Adenovirus NOT DETECTED NOT DETECTED Final   Coronavirus 229E NOT DETECTED NOT DETECTED Final    Comment: (NOTE) The Coronavirus on the Respiratory Panel, DOES NOT test for the novel  Coronavirus (2019 nCoV)    Coronavirus HKU1 NOT DETECTED NOT DETECTED Final   Coronavirus NL63 NOT DETECTED NOT DETECTED Final   Coronavirus OC43 NOT DETECTED NOT DETECTED Final   Metapneumovirus NOT DETECTED NOT DETECTED Final   Rhinovirus / Enterovirus NOT DETECTED NOT DETECTED Final   Influenza A NOT DETECTED NOT DETECTED Final   Influenza B NOT DETECTED NOT DETECTED Final   Parainfluenza Virus 1 NOT DETECTED NOT DETECTED Final   Parainfluenza Virus 2 NOT DETECTED NOT DETECTED Final   Parainfluenza Virus 3 NOT DETECTED NOT DETECTED Final    Parainfluenza Virus 4 NOT DETECTED NOT DETECTED Final   Respiratory Syncytial Virus NOT DETECTED NOT DETECTED Final   Bordetella pertussis NOT DETECTED NOT DETECTED Final   Bordetella Parapertussis NOT DETECTED NOT DETECTED Final   Chlamydophila pneumoniae NOT DETECTED NOT DETECTED Final   Mycoplasma pneumoniae NOT DETECTED NOT DETECTED Final    Comment: Performed at [facility], [place]. [address] [place], [place] [address]  Culture, blood (routine x 2)     Status: None (Preliminary result)   Collection Time: 05/02/20  2:15 PM   Specimen: BLOOD  Result Value Ref Range Status   Specimen Description BLOOD LEFT ANTECUBITAL  Final   Special Requests   Final    BOTTLES DRAWN AEROBIC AND ANAEROBIC Blood Culture results may not be optimal due to an inadequate volume of blood received in culture bottles   Culture   Final    NO GROWTH 4 DAYS Performed at [facility], [place] [address], [place], [place] [address]    Report Status PENDING  Incomplete  Culture, blood (routine x 2)     Status: None (Preliminary result)   Collection Time: 05/02/20  2:31 PM   Specimen: BLOOD LEFT HAND  Result Value Ref Range Status   Specimen Description BLOOD LEFT HAND  Final   Special Requests   Final    BOTTLES DRAWN AEROBIC ONLY Blood Culture adequate volume   Culture   Final    NO GROWTH 4  DAYS Performed at [facility], [place] [address], [place], [place] [address]    Report Status PENDING  Incomplete         Radiology Studies: DG CHEST PORT 1 VIEW  Result Date: 05/05/2020 CLINICAL DATA:  Shortness of breath. EXAM: PORTABLE CHEST 1 VIEW COMPARISON:  05/03/2020. FINDINGS: Cardiomegaly. Diffuse bilateral pulmonary interstitial edema/infiltrates, progressed from prior exam. No pleural effusion or pneumothorax. No acute bony abnormality. IMPRESSION: 1. Cardiomegaly. 2. Diffuse bilateral pulmonary interstitial edema/infiltrates, progressed from prior exam. Electronically Signed   By: [place]   On: 05/05/2020 06:25      Scheduled Meds: . sodium chloride   Intravenous Once  . sodium chloride   Intravenous Once  . aspirin EC  81 mg Oral Daily  . diphenhydrAMINE  25 mg Intravenous Once  . docusate sodium  100 mg Oral BID  . famotidine  40 mg Oral Daily  . insulin aspart  0-9 Units Subcutaneous TID WC  . metoprolol succinate  75 mg Oral Daily  . montelukast  10 mg Oral QHS  . pantoprazole  40 mg Oral Daily  . potassium & sodium phosphates  1 packet Oral TID WC & HS  . pravastatin  40 mg Oral QHS  . Ensure Max Protein  11 oz Oral TID  . senna-docusate  1 tablet Oral Q0600   Continuous Infusions: . ceFEPime (MAXIPIME) IV 2 g (05/06/20 8341)  . sodium chloride       LOS: 7 days      Debbe Odea, MD [facility] Pager: [email or web address] 05/06/2020, 12:56 PM

## 2020-05-06 NOTE — Progress Notes (Signed)
  Speech Language Pathology Treatment: Dysphagia  Patient Details Name: Kathy Irwin MRN: [id number] DOB: July 30, 1950 Today's Date: 05/06/2020 Time: 4235-3614 SLP Time Calculation (min) (ACUTE ONLY): 21 min  Assessment / Plan / Recommendation Clinical Impression  Pt seen for a skilled treatment focusing on diet tolerance with regular/thin liquids with min verbal cueing provided for intake of POs with improved mentation as pt was able to answer questions appropriately and was compliant/willing to participate in tx session.  No overt s/s noted with intake of regular/thin liquids via cup/straw given min cues for slow intake and small bites/sips during consumption.  Pt had breakfast prior to SLP arriving, so min consumption noted during trial, ut no overt s/s of aspiration noted despite respiratory compromise with dx of PNA.  Pt educated re: breathing/swallowing reciprocity and importance of slow intake and small bites/sips with pacing during episodes of SOB during meals/snacks.  Posture of 90 degrees or as high as tolerated discussed as well for maximum benefit during consumption of POs. Pt is consuming current diet without dysphagia present and mentation improved, so ST will s/o at this time.     HPI HPI: Pt is 70 y.o. female presenting with a closed intertrochanteric fracture of hip and history of PMH HTN, ILD, chronic respiratory failure ( 2L [place] at night), DM 2, obesity who presented to [facility] for right hip pain. CXR revealed patchy airspace opacities consistent with acute pneumonia. Code stroke initiated 05/02/20 for AMS. Received a BSE on 11/10/19 which revealed no s/s of aspiration; a full liquid diet was recomended due to lethargy during evaluation.      SLP Plan  Discharge SLP treatment due to (comment) (goals achieved)       Recommendations  Diet recommendations: Regular;Thin liquid Liquids provided via: Cup;Straw Medication Administration: Whole meds with liquid Supervision: Patient  able to self feed;Intermittent supervision to cue for compensatory strategies Compensations: Slow rate;Small sips/bites                Oral Care Recommendations: Oral care BID Follow up Recommendations: None SLP Visit Diagnosis: Dysphagia, unspecified (R13.10) Plan: Discharge SLP treatment due to (comment) (goals achieved)                      Kathy Irwin, M.S., CCC-SLP 05/06/2020, 11:59 AM

## 2020-05-07 DIAGNOSIS — J9621 Acute and chronic respiratory failure with hypoxia: Secondary | ICD-10-CM | POA: Diagnosis not present

## 2020-05-07 DIAGNOSIS — S72141A Displaced intertrochanteric fracture of right femur, initial encounter for closed fracture: Secondary | ICD-10-CM | POA: Diagnosis not present

## 2020-05-07 DIAGNOSIS — I5031 Acute diastolic (congestive) heart failure: Secondary | ICD-10-CM | POA: Diagnosis not present

## 2020-05-07 DIAGNOSIS — J189 Pneumonia, unspecified organism: Secondary | ICD-10-CM | POA: Diagnosis not present

## 2020-05-07 LAB — GLUCOSE, CAPILLARY
Glucose-Capillary: 112 mg/dL — ABNORMAL HIGH (ref 70–99)
Glucose-Capillary: 160 mg/dL — ABNORMAL HIGH (ref 70–99)
Glucose-Capillary: 143 mg/dL — ABNORMAL HIGH (ref 70–99)
Glucose-Capillary: 158 mg/dL — ABNORMAL HIGH (ref 70–99)

## 2020-05-07 LAB — CULTURE, BLOOD (ROUTINE X 2)
Culture: NO GROWTH
Culture: NO GROWTH
Special Requests: ADEQUATE

## 2020-05-07 LAB — BASIC METABOLIC PANEL
Anion gap: 8 (ref 5–15)
BUN: 9 mg/dL (ref 8–23)
CO2: 24 mmol/L (ref 22–32)
Calcium: 8.7 mg/dL — ABNORMAL LOW (ref 8.9–10.3)
Chloride: 104 mmol/L (ref 98–111)
Creatinine, Ser: 0.58 mg/dL (ref 0.44–1.00)
GFR, Estimated: >60 mL/min (ref >60)
Glucose, Bld: 174 mg/dL — ABNORMAL HIGH (ref 70–99)
Potassium: 4 mmol/L (ref 3.5–5.1)
Sodium: 136 mmol/L (ref 135–145)

## 2020-05-07 LAB — CBC
HCT: 22.7 % — ABNORMAL LOW (ref 36.0–46.0)
Hemoglobin: 7.3 g/dL — ABNORMAL LOW (ref 12.0–15.0)
MCH: 30.4 pg (ref 26.0–34.0)
MCHC: 32.2 g/dL (ref 30.0–36.0)
MCV: 94.6 fL (ref 80.0–100.0)
Platelets: 59 10*3/uL — ABNORMAL LOW (ref 150–400)
RBC: 2.4 MIL/uL — ABNORMAL LOW (ref 3.87–5.11)
RDW: 19.8 % — ABNORMAL HIGH (ref 11.5–15.5)
WBC: 14.2 10*3/uL — ABNORMAL HIGH (ref 4.0–10.5)
nRBC: 12.9 % — ABNORMAL HIGH (ref 0.0–0.2)

## 2020-05-07 MED ORDER — ASPIRIN 81 MG PO TBEC: 81.0000 mg | DELAYED_RELEASE_TABLET | Freq: Every day | ORAL | 0 refills | Status: DC

## 2020-05-07 MED ORDER — LINACLOTIDE 145 MCG PO CAPS
145.0000 ug | ORAL_CAPSULE | Freq: Every day | ORAL | Status: DC
  Administered 2020-05-08 – 2020-05-11 (×4): 145 ug via ORAL
  Filled 2020-05-07 (×5): qty 1

## 2020-05-07 MED ORDER — SERTRALINE HCL 100 MG PO TABS
100.0000 mg | ORAL_TABLET | Freq: Every day | ORAL | Status: DC
  Administered 2020-05-07 – 2020-05-11 (×5): 100 mg via ORAL
  Filled 2020-05-07 (×5): qty 1

## 2020-05-07 MED ORDER — TRAMADOL HCL 50 MG PO TABS: 50.0000 mg | ORAL_TABLET | Freq: Four times a day (QID) | ORAL | 0 refills | Status: DC | PRN

## 2020-05-07 NOTE — Plan of Care (Signed)
  Problem: Nutrition: Goal: Adequate nutrition will be maintained Outcome: Progressing

## 2020-05-07 NOTE — Progress Notes (Signed)
Physical Therapy Treatment Patient Details Name: Kathy Irwin MRN: [id number] DOB: [birth year] Today's Date: 05/07/2020    History of Present Illness 70 yo female with mechanical fall was admitted for IM nailing of her R intertrochanteric fracture. 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    PT Comments    Pt making steady progress. Able to get OOB to recliner. Continue to recommend ST-SNF for further rehab.    Follow Up Recommendations  SNF     Equipment Recommendations  None recommended by PT    Recommendations for Other Services       Precautions / Restrictions Precautions Precautions: Fall    Mobility  Bed Mobility Overal bed mobility: Needs Assistance Bed Mobility: Supine to Sit     Supine to sit: +2 for physical assistance;Mod assist     General bed mobility comments: Assist to bring legs off of bed, elevate trunk into sitting and bring hips to EOB.  Transfers Overall transfer level: Needs assistance Equipment used: Rolling walker (2 wheeled) Transfers: Sit to/from [facility] Sit to Stand: +2 physical assistance;Mod assist Stand pivot transfers: +2 physical assistance;Min assist       General transfer comment: Assist to bring hips up. Verbal cues for hand placement. Bed to chair with walker with small pivotal steps.  Ambulation/Gait                 Stairs             Wheelchair Mobility    Modified Rankin (Stroke Patients Only)       Balance Overall balance assessment: Needs assistance Sitting-balance support: No upper extremity supported;Feet supported Sitting balance-Leahy Scale: Fair     Standing balance support: Bilateral upper extremity supported;During functional activity Standing balance-Leahy Scale: Poor Standing balance comment: walker and +2 min assist                            Cognition Arousal/Alertness: Awake/alert Behavior During Therapy: Flat affect Overall  Cognitive Status: Impaired/Different from baseline Area of Impairment: Awareness;Problem solving;Following commands;Attention                   Current Attention Level: Selective   Following Commands: Follows one step commands consistently Safety/Judgement: Decreased awareness of safety;Decreased awareness of deficits Awareness: Emergent Problem Solving: Slow processing;Requires verbal cues General Comments: Pt with much improved cognition      Exercises      General Comments        Pertinent Vitals/Pain Pain Assessment: Faces Faces Pain Scale: Hurts even more Pain Location: rt hip and thig Pain Descriptors / Indicators: Operative site guarding;Grimacing;Guarding Pain Intervention(s): Limited activity within patient's tolerance;Monitored during session;Premedicated before session;Repositioned    Home Living                      Prior Function            PT Goals (current goals can now be found in the care plan section) Acute Rehab PT Goals Patient Stated Goal: to get home Progress towards PT goals: Progressing toward goals    Frequency    Min 3X/week      PT Plan Current plan remains appropriate    Co-evaluation              AM-PAC PT "6 Clicks" Mobility   Outcome Measure  Help needed turning from your back to your side while in  a flat bed without using bedrails?: A Lot Help needed moving from lying on your back to sitting on the side of a flat bed without using bedrails?: A Lot Help needed moving to and from a bed to a chair (including a wheelchair)?: A Lot Help needed standing up from a chair using your arms (e.g., wheelchair or bedside chair)?: A Lot Help needed to walk in hospital room?: Total Help needed climbing 3-5 steps with a railing? : Total 6 Click Score: 10    End of Session Equipment Utilized During Treatment: Oxygen;Gait belt Activity Tolerance: Patient tolerated treatment well Patient left: with call bell/phone  within reach;in chair;with chair alarm set Nurse Communication: Mobility status PT Visit Diagnosis: Muscle weakness (generalized) (M62.81);Pain;History of falling (Z91.81) Pain - Right/Left: Right Pain - part of body: Hip     Time: 1039-1101 PT Time Calculation (min) (ACUTE ONLY): 22 min  Charges:  $Therapeutic Activity: 8-22 mins                     [place] Pager [phone] Office [place] 05/07/2020, 1:23 PM

## 2020-05-07 NOTE — Progress Notes (Signed)
Physical Therapy Treatment Patient Details Name: Kathy Irwin MRN: [id number] DOB: Aug 20, 1950 Today's Date: 05/07/2020    History of Present Illness 70 yo female with mechanical fall was admitted for IM nailing of her R intertrochanteric fracture. 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    PT Comments    Pt tolerate OOB for 2 hours. Steady progress with mobility.    Follow Up Recommendations  SNF     Equipment Recommendations  None recommended by PT    Recommendations for Other Services       Precautions / Restrictions Precautions Precautions: Fall    Mobility  Bed Mobility Overal bed mobility: Needs Assistance Bed Mobility: Sit to Supine     Supine to sit: +2 for physical assistance;Mod assist Sit to supine: +2 for physical assistance;Mod assist   General bed mobility comments: Assist to bring legs up into bed and lower trunk.  Transfers Overall transfer level: Needs assistance Equipment used: Rolling walker (2 wheeled) Transfers: Sit to/from [facility] Sit to Stand: +2 safety/equipment;Min assist Stand pivot transfers: +2 physical assistance;Min assist       General transfer comment: Assist to bring hips up. Verbal cues for hand placement. Chair to bed with walker with small pivotal steps.  Ambulation/Gait                 Stairs             Wheelchair Mobility    Modified Rankin (Stroke Patients Only)       Balance Overall balance assessment: Needs assistance Sitting-balance support: No upper extremity supported;Feet supported Sitting balance-Leahy Scale: Fair     Standing balance support: Bilateral upper extremity supported;During functional activity Standing balance-Leahy Scale: Poor Standing balance comment: walker and +2 min assist                            Cognition Arousal/Alertness: Awake/alert Behavior During Therapy: Flat affect Overall Cognitive Status:  Impaired/Different from baseline Area of Impairment: Problem solving                   Current Attention Level: Selective   Following Commands: Follows one step commands consistently Safety/Judgement: Decreased awareness of safety;Decreased awareness of deficits Awareness: Emergent Problem Solving: Requires verbal cues General Comments: Pt with much improved cognition      Exercises      General Comments        Pertinent Vitals/Pain Pain Assessment: Faces Faces Pain Scale: Hurts even more Pain Location: rt hip and thig Pain Descriptors / Indicators: Operative site guarding;Grimacing;Guarding Pain Intervention(s): Limited activity within patient's tolerance;Monitored during session;Repositioned    Home Living                      Prior Function            PT Goals (current goals can now be found in the care plan section) Acute Rehab PT Goals Patient Stated Goal: to get home Progress towards PT goals: Progressing toward goals    Frequency    Min 3X/week      PT Plan Current plan remains appropriate    Co-evaluation              AM-PAC PT "6 Clicks" Mobility   Outcome Measure  Help needed turning from your back to your side while in a flat bed without using bedrails?: A Lot Help needed moving from  lying on your back to sitting on the side of a flat bed without using bedrails?: A Lot Help needed moving to and from a bed to a chair (including a wheelchair)?: A Lot Help needed standing up from a chair using your arms (e.g., wheelchair or bedside chair)?: A Lot Help needed to walk in hospital room?: Total Help needed climbing 3-5 steps with a railing? : Total 6 Click Score: 10    End of Session Equipment Utilized During Treatment: Oxygen Activity Tolerance: Patient tolerated treatment well Patient left: with call bell/phone within reach;in bed;with bed alarm set Nurse Communication: Mobility status PT Visit Diagnosis: Muscle weakness  (generalized) (M62.81);Pain;History of falling (Z91.81) Pain - Right/Left: Right Pain - part of body: Hip     Time: 2763-9432 PT Time Calculation (min) (ACUTE ONLY): 18 min  Charges:  $Therapeutic Activity: 8-22 mins                     [place] Pager [phone] Office [place] 05/07/2020, 1:30 PM

## 2020-05-07 NOTE — Progress Notes (Signed)
Pt unable to find comfortable position in supine or sitting for ADL training.  RN notified pt requests pain meds. Improvement noted in cognition (attention and following commands), but pt continues to demonstrate impaired memory and problem solving.    05/07/20 1600  OT Visit Information  Last OT Received On 05/07/20  Assistance Needed +2  History of Present Illness 70 yo female with mechanical fall was admitted for IM nailing of her R intertrochanteric fracture. 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  Precautions  Precautions Fall  Pain Assessment  Pain Assessment Faces  Faces Pain Scale 8  Pain Location rt hip  Pain Descriptors / Indicators Operative site guarding;Grimacing;Guarding  Pain Intervention(s) Repositioned;Patient requesting pain meds-RN notified (refused ice)  Cognition  Arousal/Alertness Awake/alert  Behavior During Therapy Flat affect  Overall Cognitive Status Impaired/Different from baseline  Area of Impairment Problem solving;Memory;Attention  Current Attention Level Selective  Memory Decreased short-term memory  Problem Solving Requires verbal cues  General Comments pt with pain, did not initiate calling for pain meds, unable to recall when she's had them last  ADL  Overall ADL's  Needs assistance/impaired  Eating/Feeding Independent;Bed level  Eating/Feeding Details (indicate cue type and reason) ice  Bed Mobility  Overal bed mobility Needs Assistance  Bed Mobility Sit to Supine;Supine to Sit  Supine to sit Max assist  Sit to supine Mod assist  General bed mobility comments assist for LEs over EOB and to raise trunk, assisted LEs back into bed  Balance  Overall balance assessment Needs assistance  Sitting balance-Leahy Scale Poor  Postural control Left lateral lean (attempting to unload R hip)  Restrictions  RLE Weight Bearing WBAT  Transfers  General transfer comment deferred, pt wanting to return to supine and get pain meds   OT - End of Session  Equipment Utilized During Treatment Oxygen (3L)  Activity Tolerance Patient limited by pain  Patient left in bed;with call bell/phone within reach;with bed alarm set;with family/visitor present  Nurse Communication Patient requests pain meds  OT Assessment/Plan  OT Plan Discharge plan remains appropriate  OT Visit Diagnosis Other abnormalities of gait and mobility (R26.89);Muscle weakness (generalized) (M62.81);History of falling (Z91.81);Pain  Pain - Right/Left Right  Pain - part of body Hip  OT Frequency (ACUTE ONLY) Min 2X/week  Follow Up Recommendations SNF  OT Equipment Other (comment) (defer to next venue)  AM-PAC OT "6 Clicks" Daily Activity Outcome Measure (Version 2)  Help from another person eating meals? 4  Help from another person taking care of personal grooming? 3  Help from another person toileting, which includes using toliet, bedpan, or urinal? 1  Help from another person bathing (including washing, rinsing, drying)? 2  Help from another person to put on and taking off regular upper body clothing? 2  Help from another person to put on and taking off regular lower body clothing? 1  6 Click Score 13  OT Goal Progression  Progress towards OT goals Not progressing toward goals - comment (limited by pain)  Acute Rehab OT Goals  Patient Stated Goal to get home  OT Goal Formulation With patient  Time For Goal Achievement 05/18/20  Potential to Achieve Goals Good  OT Time Calculation  OT Start Time (ACUTE ONLY) 1619  OT Stop Time (ACUTE ONLY) 1637  OT Time Calculation (min) 18 min  OT General Charges  $OT Visit 1 Visit  OT Treatments  $Therapeutic Activity 8-22 mins  Nestor Lewandowsky, OTR/L Acute Rehabilitation Services Pager:  [phone] Office: [phone]

## 2020-05-07 NOTE — Progress Notes (Signed)
Virtual Visit via Telephone Note  I connected with Kathy Irwin on 05/07/20 at  2:10 PM EST by telephone and verified that I am speaking with the correct person using two identifiers.  Location: Patient: home Provider: Kerin Perna _0    I discussed the limitations, risks, security and privacy concerns of performing an evaluation and management service by telephone and the availability of in person appointments. I also discussed with the patient that there may be a patient responsible charge related to this service. The patient expressed understanding and agreed to proceed.   History of Present Illness: [name] is a 70 year old female having a acute visit.  She is very upset while at work she urinated on herself and was very embarrassed.  We discussed exercises and  medication for treatment   Past Medical History:  Diagnosis Date  . Acute respiratory failure with hypoxia ([place]) 12/2018  . Diabetes mellitus without complication ([place])   . Hypersensitivity pneumonitis ([place]) 12/19/2017  . Hypertension   . ILD (interstitial lung disease) ([place]) 12/24/2018   No current facility-administered medications on file prior to visit.   Current Outpatient Medications on File Prior to Visit  Medication Sig Dispense Refill  . acetaminophen (TYLENOL) 500 MG tablet Take 1,000 mg by mouth at bedtime.    Marland Kitchen albuterol (VENTOLIN HFA) 108 (90 Base) MCG/ACT inhaler Inhale 1-2 puffs into the lungs every 6 (six) hours as needed for wheezing or shortness of breath. 18 g 1  . blood glucose meter kit and supplies KIT Dispense based on patient and insurance preference. Use up to four times daily as directed. (FOR ICD-9 250.00, 250.01). For QAC - HS accuchecks. 1 each 1  . blood glucose meter kit and supplies Dispense based on patient and insurance preference. Use up to four times daily as directed. (FOR ICD-10 E10.9, E11.9). 1 each 0  . [employer] D3 125 MCG (5000 UT) capsule Take 5,000 Units by mouth daily.     . Ensure Max Protein (ENSURE MAX PROTEIN) LIQD Take 330 mLs (11 oz total) by mouth 3 (three) times daily.    . famotidine (PEPCID) 40 MG tablet Take 1 tablet (40 mg total) by mouth daily.    . fluticasone (FLONASE) 50 MCG/ACT nasal spray Place 2 sprays into both nostrils daily as needed for allergies or rhinitis. 18.2 mL 3  . furosemide (LASIX) 20 MG tablet Take 1 pill in the morning (Patient taking differently: Take 20 mg by mouth daily.) 90 tablet 1  . glucose blood (FREESTYLE LITE) test strip For glucose testing every before meals at bedtime. Diagnosis E 11.65  Can substitute to any accepted brand 100 each 0  . linaclotide (LINZESS) 145 MCG CAPS capsule Take 1 capsule (145 mcg total) by mouth daily before breakfast.    . magnesium gluconate (MAGONATE) 30 MG tablet Take 1 tablet (30 mg total) by mouth 2 (two) times daily. (Patient taking differently: Take 30 mg by mouth daily.) 180 tablet 1  . metFORMIN (GLUCOPHAGE) 1000 MG tablet Take 1 tablet (1,000 mg total) by mouth 2 (two) times daily with a meal. (Patient taking differently: Take 1,000 mg by mouth daily with breakfast.) 180 tablet 3  . methocarbamol (ROBAXIN) 500 MG tablet Take 1 tablet (500 mg total) by mouth every 8 (eight) hours as needed for muscle spasms. 30 tablet 1  . metoprolol succinate (TOPROL-XL) 25 MG 24 hr tablet Take 1 tablet (25 mg total) by mouth daily. (Patient taking differently: Take 25 mg by mouth See  admin instructions. Take 1 tablet (25 mg) combine with 1 tablet (50 mg) to make 75 mg totally by mouth every day) 90 tablet 1  . metoprolol succinate (TOPROL-XL) 50 MG 24 hr tablet TAKE 1 TABLET BY MOUTH EVERY DAY IN THE MORNING (Patient taking differently: Take 50 mg by mouth See admin instructions. Take 1 tablet (50 mg) combine with 1 tablet (25 mg) to make 75 mg totally by mouth every day) 30 tablet 1  . Multiple Vitamin (MULTIVITAMIN WITH MINERALS) TABS tablet Take 1 tablet by mouth daily.    Marland Kitchen olopatadine (PATANOL) 0.1 %  ophthalmic solution Place 1 drop into both eyes 2 (two) times daily. (Patient taking differently: Place 1 drop into both eyes 2 (two) times daily as needed for allergies (itchy eyes).) 5 mL 0  . ondansetron (ZOFRAN) 4 MG tablet Take 1 tablet (4 mg total) by mouth every 8 (eight) hours as needed for nausea or vomiting. 12 tablet 0  . pantoprazole (PROTONIX) 40 MG tablet Take 1 tablet (40 mg total) by mouth daily. 60 tablet 0  . polyethylene glycol (MIRALAX / GLYCOLAX) 17 g packet Take 17 g by mouth daily as needed for mild constipation or moderate constipation.  0  . pravastatin (PRAVACHOL) 40 MG tablet Take 1 tablet (40 mg total) by mouth at bedtime. 90 tablet 1  . pregabalin (LYRICA) 100 MG capsule Take 1 capsule (100 mg total) by mouth at bedtime. 90 capsule 0  . senna-docusate (SENOKOT-S) 8.6-50 MG tablet Take 1 tablet by mouth 2 (two) times daily.    . sertraline (ZOLOFT) 100 MG tablet Take 1 pill at bedtime (Patient not taking: No sig reported) 90 tablet 1  . simethicone (MYLICON) 80 MG chewable tablet Chew 1 tablet (80 mg total) by mouth 4 (four) times daily as needed for flatulence.    . sodium chloride (OCEAN) 0.65 % SOLN nasal spray Place 1 spray into both nostrils as needed for congestion. 15 mL 0  . tamsulosin (FLOMAX) 0.4 MG CAPS capsule Take 1 capsule (0.4 mg total) by mouth daily. 90 capsule 1  . cetirizine (ZYRTEC ALLERGY) 10 MG tablet Take 1 tablet (10 mg total) by mouth daily for 7 days. (Patient not taking: Reported on 04/29/2020) 20 tablet 0   Observations/Objective: There were no vitals taken for this visit.  Pertinent positives and negatives as listed in HPI  Assessment and Plan: Kathy Irwin was seen today for overactive bladder .  Diagnoses and all orders for this visit:  OAB (overactive bladder) We discussed trying to improve pelvic floor by Kegel exercises and would try medication for OAB. -     oxybutynin (DITROPAN) 5 MG tablet; Take 1 tablet (5 mg total) by mouth 3 (three)  times daily.    Follow Up Instructions:    I discussed the assessment and treatment plan with the patient. The patient was provided an opportunity to ask questions and all were answered. The patient agreed with the plan and demonstrated an understanding of the instructions.   The patient was advised to call back or seek an in-person evaluation if the symptoms worsen or if the condition fails to improve as anticipated.  I provided 15 inutes of non-face-to-face time during this encounter.   Kerin Perna, NP

## 2020-05-07 NOTE — Progress Notes (Addendum)
Pt desat to 82 on O2 1L via [place] while awake.  Turned up to 2L, and satting 88%.

## 2020-05-07 NOTE — Progress Notes (Signed)
PROGRESS NOTE    Kathy Irwin   OZD:[id number]  DOB: 09/04/1950  DOA: 04/29/2020 PCP: Kerin Perna, NP   Brief Narrative:  Kathy Irwin is a 70 y.o.femalewith medical history significant ofHTN, interstitial lung disease (hypersensitivity pneumonitis),chronic respiratory failure with hypoxia on 2 L at night,DM type II, ITP,CMML-1,and obesity presents after having a mechanicl fall while at work and right hip pain.   In ED > subtrochanteric fracture of right femur RR in 30s Platelets in 30s.   1/14 underwent IM nail/ percutaneous fixation of femoral neck fractuire 1/15 Hb 6.9 from 8.9 1/16- increasingly lethargic after IV Morphine- code stroke called and notes state she was obtunded- CTA chest revealed multifocal infiltrates consistent with pneumonia, more hypoxic- vitals> 79/36, temp 100.2, HR 151, RR in 30s,  Narcan given with some improvement in lethargy but ongoing confusion-  MRI ordered but patient unable to cooperate  1/17, pulse ox 85% on room air, HR in 130s- pulm consulted- PT recommends SNF, FL2 done 1/18- MRI attempted- pt did not allow moving to exam table   Subjective: She complains of pain in left hip and leg today.     Assessment & Plan:   Principal Problem:   Closed intertrochanteric fracture of hip, right, initial encounter  - repaired on 1/14- PT recommended SNF  Active Problems:   Acute on chronic respiratory failure with hypoxemia (HCC)    History of Hypersensitivity pneumonitis     Multifocal pneumonia    Acute on chronic diastolic CHF - aspiration vs CAP -  antibiotics started on 1/17>  Vancomycin and Cefepime - d/c Vancomycin on 1/19- MRSA PCR negative on 1/13 - wean O2 as able- she typically uses 3 L At night at home - started lasix for pulmonary edema noted on CXR and on exam  - follow electrolytes and Cr and wean O2 as able - I have been able to wean down to 1 L of O2 today- pulse ox is 87% at rest on room air. ]- cont Cefepime-  WBC improved to 14.2 today    Acute metabolic encephalopathy - noted to occur on 1/16 and I suspect it was related to acute infection (pneumonia) with narcotics and hypoxia complicating things - per daughter, the patient was alert and oriented on 1/18- it seems that she became agitated and confused after receiving Ativan to attempt an MRI- will avoid Benzodizepines - this AM she is very sleepy although she last received sedatives (Ativan) yesaterday for the MRI - d/c MRI as at this point I do not think she had a CVA - Using only Tramadol PRN for pain which, per report from daughter does not adversely affect her - awake and alert now    CMML 1 and thrombocytopenia - no treatment indicated fro CMML per oncology note on 1/18 - given 3 U total of pheresed platelets thus far- platelets are in 70,000 range- use steroids for ITP if platelets < 50K - plt have dropped to 59 today- follow closely   Anemia - baseline Hb ~ 11 - likely acute blood loss due to fracture in setting of AOCD - has received a total of 4 u PRBC so far - Hb remaining steady at 7-8 range    DM2    Component Value Date/Time   HGBA1C 6.7 (H) 04/30/2020 0334  - cont SSI- holding Amaryl and Metformin     Time spent in minutes: 45 DVT prophylaxis: SCDs Start: 04/30/20 1811 Code Status: Full code Family Communication: daughter at bedside Disposition  Plan:  Status is: Inpatient  Remains inpatient appropriate because:IV treatments appropriate due to intensity of illness or inability to take PO   Dispo: The patient is from: Home              Anticipated d/c is to: SNF              Anticipated d/c date is: > 3 days              Patient currently is not medically stable to d/c.  Consultants:   Orthopedic surgery  PCCM Procedures:   IM nair of right hip Antimicrobials:  Anti-infectives (From admission, onward)   Start     Dose/Rate Route Frequency Ordered Stop   05/03/20 1400  vancomycin (VANCOREADY) IVPB 1750  mg/350 mL  Status:  Discontinued        1,750 mg 175 mL/hr over 120 Minutes Intravenous Every 24 hours 05/02/20 1317 05/05/20 1347   05/02/20 1400  vancomycin (VANCOCIN) 2,500 mg in sodium chloride 0.9 % 500 mL IVPB        2,500 mg 250 mL/hr over 120 Minutes Intravenous  Once 05/02/20 1305 05/02/20 1850   05/02/20 1315  ceFEPIme (MAXIPIME) 2 g in sodium chloride 0.9 % 100 mL IVPB        2 g 200 mL/hr over 30 Minutes Intravenous Every 8 hours 05/02/20 1305     04/30/20 2200  ceFAZolin (ANCEF) IVPB 2g/100 mL premix        2 g 200 mL/hr over 30 Minutes Intravenous Every 8 hours 04/30/20 1810 05/01/20 1443   04/30/20 1438  vancomycin (VANCOCIN) powder  Status:  Discontinued          As needed 04/30/20 1438 04/30/20 1603   04/30/20 0600  ceFAZolin (ANCEF) IVPB 2g/100 mL premix        2 g 200 mL/hr over 30 Minutes Intravenous On call to O.R. 04/29/20 2128 04/30/20 1257       Objective: Vitals:   05/07/20 0730 05/07/20 0922 05/07/20 1002 05/07/20 1126  BP: 125/62 140/70  (!) 111/55  Pulse: 88 98 95 87  Resp: (!) 25  (!) 26 18  Temp: 97.7 F (36.5 C)   97.9 F (36.6 C)  TempSrc: Oral   Axillary  SpO2: 96%  (!) 82% 94%  Weight:      Height:        Intake/Output Summary (Last 24 hours) at 05/07/2020 1355 Last data filed at 05/07/2020 1128 Gross per 24 hour  Intake --  Output 4100 ml  Net -4100 ml   Filed Weights   05/06/20 0308 05/06/20 0509 05/07/20 0430  Weight: 120.4 kg 119.7 kg 116.8 kg    Examination: General exam: Appears comfortable  HEENT: PERRLA, oral mucosa moist, no sclera icterus or thrush Respiratory system: Clear to auscultation. Respiratory effort normal. Cardiovascular system: S1 & S2 heard, regular rate and rhythm Gastrointestinal system: Abdomen soft, non-tender, nondistended. Normal bowel sounds   Central nervous system: Alert and oriented. No focal neurological deficits. Extremities: No cyanosis, clubbing - she has edema of her right leg Skin: No rashes  or ulcers Psychiatry:  Mood & affect appropriate.     Data Reviewed: I have personally reviewed following labs and imaging studies  CBC: Recent Labs  Lab 05/01/20 0301 05/02/20 0224 05/03/20 0428 05/04/20 0639 05/05/20 0257 05/06/20 0221 05/07/20 0211  WBC 10.3 13.9* 17.3* 17.6* 17.2* 16.6* 14.2*  NEUTROABS 3.6 6.4  --  10.3* 9.0*  --   --  HGB 6.9* 7.1* 7.6* 7.5* 7.4* 7.3* 7.3*  HCT 21.8* 21.0* 21.9* 23.6* 23.8* 23.9* 22.7*  MCV 96.5 94.6 88.3 94.0 94.4 94.8 94.6  PLT 47* 65* 68* 77* 76* 67* 59*   Basic Metabolic Panel: Recent Labs  Lab 05/01/20 0301 05/02/20 0224 05/03/20 0220 05/03/20 1114 05/04/20 0639 05/05/20 0257 05/06/20 0221 05/07/20 0211  NA 135 137 142  --  137 137 137 136  K 4.9 4.6 4.6  --  4.2 4.2 4.2 4.0  CL 103 106 113*  --  107 109 107 104  CO2 24 24 21*  --  _0 GLUCOSE 178* 194* 165*  --  168* 152* 139* 174*  BUN 27* 14 13  --  _1 CREATININE 1.38* 0.71 0.68  --  0.52 0.58 0.50 0.58  CALCIUM 8.6* 8.6* 8.6*  --  8.5* 8.5* 8.4* 8.7*  MG 1.7 2.1  --  2.0 1.7 1.6*  --   --   PHOS 4.6 2.1*  --  1.4* 2.4* 2.1*  --   --    GFR: Estimated Creatinine Clearance: 81.9 mL/min (by C-G formula based on SCr of 0.58 mg/dL). Liver Function Tests: Recent Labs  Lab 05/01/20 0301 05/02/20 0224 05/03/20 1114 05/04/20 0639 05/05/20 0257  AST 32 34 44* 37 43*  ALT _2 ALKPHOS 38 43 46 46 51  BILITOT 1.0 1.2 1.7* 1.1 1.3*  PROT 5.1* 5.3* 5.5* 5.4* 5.4*  ALBUMIN 2.9* 2.6* 2.3* 2.2* 2.1*   No results for input(s): LIPASE, AMYLASE in the last 168 hours. No results for input(s): AMMONIA in the last 168 hours. Coagulation Profile: No results for input(s): INR, PROTIME in the last 168 hours. Cardiac Enzymes: No results for input(s): CKTOTAL, CKMB, CKMBINDEX, TROPONINI in the last 168 hours. BNP (last 3 results) No results for input(s): PROBNP in the last 8760 hours. HbA1C: No results for input(s): HGBA1C in the last 72  hours. CBG: Recent Labs  Lab 05/06/20 1201 05/06/20 1657 05/06/20 2045 05/07/20 0728 05/07/20 1122  GLUCAP 127* 123* 191* 112* 160*   Lipid Profile: No results for input(s): CHOL, HDL, LDLCALC, TRIG, CHOLHDL, LDLDIRECT in the last 72 hours. Thyroid Function Tests: No results for input(s): TSH, T4TOTAL, FREET4, T3FREE, THYROIDAB in the last 72 hours. Anemia Panel: No results for input(s): VITAMINB12, FOLATE, FERRITIN, TIBC, IRON, RETICCTPCT in the last 72 hours. Urine analysis:    Component Value Date/Time   COLORURINE YELLOW 05/04/2020 0050   APPEARANCEUR CLEAR 05/04/2020 0050   LABSPEC 1.014 05/04/2020 0050   PHURINE 7.0 05/04/2020 0050   GLUCOSEU NEGATIVE 05/04/2020 0050   HGBUR NEGATIVE 05/04/2020 0050   BILIRUBINUR NEGATIVE 05/04/2020 0050   KETONESUR NEGATIVE 05/04/2020 0050   PROTEINUR NEGATIVE 05/04/2020 0050   UROBILINOGEN 1.0 08/14/2019 1824   NITRITE NEGATIVE 05/04/2020 0050   LEUKOCYTESUR NEGATIVE 05/04/2020 0050   Sepsis Labs: _3 (procalcitonin:4,lacticidven:4) ) Recent Results (from the past 240 hour(s))  Resp Panel by RT-PCR (Flu A&B, Covid) Nasopharyngeal Swab     Status: None   Collection Time: 04/29/20 12:59 PM   Specimen: Nasopharyngeal Swab; Nasopharyngeal(NP) swabs in vial transport medium  Result Value Ref Range Status   SARS Coronavirus 2 by RT PCR NEGATIVE NEGATIVE Final    Comment: (NOTE) SARS-CoV-2 target nucleic acids are NOT DETECTED.  The SARS-CoV-2 RNA is generally detectable in upper respiratory specimens during the acute phase of infection. The lowest concentration of SARS-CoV-2 viral copies this assay can detect  is 138 copies/mL. A negative result does not preclude SARS-Cov-2 infection and should not be used as the sole basis for treatment or other patient management decisions. A negative result may occur with  improper specimen collection/handling, submission of specimen other than nasopharyngeal swab, presence of viral  mutation(s) within the areas targeted by this assay, and inadequate number of viral copies(<138 copies/mL). A negative result must be combined with clinical observations, patient history, and epidemiological information. The expected result is Negative.  Fact Sheet for Patients:  EntrepreneurPulse.com.au  Fact Sheet for Healthcare Providers:  IncredibleEmployment.be  This test is no t yet approved or cleared by the [place] FDA and  has been authorized for detection and/or diagnosis of SARS-CoV-2 by FDA under an Emergency Use Authorization (EUA). This EUA will remain  in effect (meaning this test can be used) for the duration of the COVID-19 declaration under Section 564(b)(1) of the Act, 21 U.S.C.section 360bbb-3(b)(1), unless the authorization is terminated  or revoked sooner.       Influenza A by PCR NEGATIVE NEGATIVE Final   Influenza B by PCR NEGATIVE NEGATIVE Final    Comment: (NOTE) The Xpert Xpress SARS-CoV-2/FLU/RSV plus assay is intended as an aid in the diagnosis of influenza from Nasopharyngeal swab specimens and should not be used as a sole basis for treatment. Nasal washings and aspirates are unacceptable for Xpert Xpress SARS-CoV-2/FLU/RSV testing.  Fact Sheet for Patients: EntrepreneurPulse.com.au  Fact Sheet for Healthcare Providers: IncredibleEmployment.be  This test is not yet approved or cleared by the [place] FDA and has been authorized for detection and/or diagnosis of SARS-CoV-2 by FDA under an Emergency Use Authorization (EUA). This EUA will remain in effect (meaning this test can be used) for the duration of the COVID-19 declaration under Section 564(b)(1) of the Act, 21 U.S.C. section 360bbb-3(b)(1), unless the authorization is terminated or revoked.  Performed at [facility], [place] [address] [place], [place] [address]   Surgical pcr screen     Status: None    Collection Time: 04/29/20 10:38 PM   Specimen: Nasal Mucosa; Nasal Swab  Result Value Ref Range Status   MRSA, PCR NEGATIVE NEGATIVE Final   Staphylococcus aureus NEGATIVE NEGATIVE Final    Comment: (NOTE) The Xpert SA Assay (FDA approved for NASAL specimens in patients 76 years of age and older), is one component of a comprehensive surveillance program. It is not intended to diagnose infection nor to guide or monitor treatment. Performed at [facility], [place] [address], [place], [place] [address]   Resp Panel by RT-PCR (Flu A&B, Covid) Nasopharyngeal Swab     Status: None   Collection Time: 05/02/20  1:01 PM   Specimen: Nasopharyngeal Swab; Nasopharyngeal(NP) swabs in vial transport medium  Result Value Ref Range Status   SARS Coronavirus 2 by RT PCR NEGATIVE NEGATIVE Final    Comment: (NOTE) SARS-CoV-2 target nucleic acids are NOT DETECTED.  The SARS-CoV-2 RNA is generally detectable in upper respiratory specimens during the acute phase of infection. The lowest concentration of SARS-CoV-2 viral copies this assay can detect is 138 copies/mL. A negative result does not preclude SARS-Cov-2 infection and should not be used as the sole basis for treatment or other patient management decisions. A negative result may occur with  improper specimen collection/handling, submission of specimen other than nasopharyngeal swab, presence of viral mutation(s) within the areas targeted by this assay, and inadequate number of viral copies(<138 copies/mL). A negative result must be combined with clinical observations, patient  history, and epidemiological information. The expected result is Negative.  Fact Sheet for Patients:  EntrepreneurPulse.com.au  Fact Sheet for Healthcare Providers:  IncredibleEmployment.be  This test is no t yet approved or cleared by the [place] FDA and  has been authorized for detection and/or diagnosis of SARS-CoV-2  by FDA under an Emergency Use Authorization (EUA). This EUA will remain  in effect (meaning this test can be used) for the duration of the COVID-19 declaration under Section 564(b)(1) of the Act, 21 U.S.C.section 360bbb-3(b)(1), unless the authorization is terminated  or revoked sooner.       Influenza A by PCR NEGATIVE NEGATIVE Final   Influenza B by PCR NEGATIVE NEGATIVE Final    Comment: (NOTE) The Xpert Xpress SARS-CoV-2/FLU/RSV plus assay is intended as an aid in the diagnosis of influenza from Nasopharyngeal swab specimens and should not be used as a sole basis for treatment. Nasal washings and aspirates are unacceptable for Xpert Xpress SARS-CoV-2/FLU/RSV testing.  Fact Sheet for Patients: EntrepreneurPulse.com.au  Fact Sheet for Healthcare Providers: IncredibleEmployment.be  This test is not yet approved or cleared by the [place] FDA and has been authorized for detection and/or diagnosis of SARS-CoV-2 by FDA under an Emergency Use Authorization (EUA). This EUA will remain in effect (meaning this test can be used) for the duration of the COVID-19 declaration under Section 564(b)(1) of the Act, 21 U.S.C. section 360bbb-3(b)(1), unless the authorization is terminated or revoked.  Performed at [facility], [place] [address] [place], [place] [address]   Respiratory (~20 pathogens) panel by PCR     Status: None   Collection Time: 05/02/20  1:01 PM   Specimen: Nasopharyngeal Swab; Respiratory  Result Value Ref Range Status   Adenovirus NOT DETECTED NOT DETECTED Final   Coronavirus 229E NOT DETECTED NOT DETECTED Final    Comment: (NOTE) The Coronavirus on the Respiratory Panel, DOES NOT test for the novel  Coronavirus (2019 nCoV)    Coronavirus HKU1 NOT DETECTED NOT DETECTED Final   Coronavirus NL63 NOT DETECTED NOT DETECTED Final   Coronavirus OC43 NOT DETECTED NOT DETECTED Final   Metapneumovirus NOT DETECTED NOT DETECTED  Final   Rhinovirus / Enterovirus NOT DETECTED NOT DETECTED Final   Influenza A NOT DETECTED NOT DETECTED Final   Influenza B NOT DETECTED NOT DETECTED Final   Parainfluenza Virus 1 NOT DETECTED NOT DETECTED Final   Parainfluenza Virus 2 NOT DETECTED NOT DETECTED Final   Parainfluenza Virus 3 NOT DETECTED NOT DETECTED Final   Parainfluenza Virus 4 NOT DETECTED NOT DETECTED Final   Respiratory Syncytial Virus NOT DETECTED NOT DETECTED Final   Bordetella pertussis NOT DETECTED NOT DETECTED Final   Bordetella Parapertussis NOT DETECTED NOT DETECTED Final   Chlamydophila pneumoniae NOT DETECTED NOT DETECTED Final   Mycoplasma pneumoniae NOT DETECTED NOT DETECTED Final    Comment: Performed at [facility], [place]. [address], [place], [place] [address]  Culture, blood (routine x 2)     Status: None   Collection Time: 05/02/20  2:15 PM   Specimen: BLOOD  Result Value Ref Range Status   Specimen Description BLOOD LEFT ANTECUBITAL  Final   Special Requests   Final    BOTTLES DRAWN AEROBIC AND ANAEROBIC Blood Culture results may not be optimal due to an inadequate volume of blood received in culture bottles   Culture   Final    NO GROWTH 5 DAYS Performed at [facility], [place] [address] [place], [place] [address]  Report Status 05/07/2020 FINAL  Final  Culture, blood (routine x 2)     Status: None   Collection Time: 05/02/20  2:31 PM   Specimen: BLOOD LEFT HAND  Result Value Ref Range Status   Specimen Description BLOOD LEFT HAND  Final   Special Requests   Final    BOTTLES DRAWN AEROBIC ONLY Blood Culture adequate volume   Culture   Final    NO GROWTH 5 DAYS Performed at [facility], [address], [place], [place] [address]    Report Status 05/07/2020 FINAL  Final         Radiology Studies: No results found.    Scheduled Meds: . sodium chloride   Intravenous Once  . sodium chloride   Intravenous Once  . aspirin EC  81 mg Oral Daily  . diphenhydrAMINE   25 mg Intravenous Once  . docusate sodium  100 mg Oral BID  . famotidine  40 mg Oral Daily  . furosemide  40 mg Intravenous BID  . insulin aspart  0-9 Units Subcutaneous TID WC  . [START ON 05/08/2020] linaclotide  145 mcg Oral QAC breakfast  . metoprolol succinate  75 mg Oral Daily  . montelukast  10 mg Oral QHS  . pantoprazole  40 mg Oral Daily  . potassium & sodium phosphates  1 packet Oral TID WC & HS  . pravastatin  40 mg Oral QHS  . Ensure Max Protein  11 oz Oral TID  . senna-docusate  1 tablet Oral Q0600  . sertraline  100 mg Oral Daily   Continuous Infusions: . ceFEPime (MAXIPIME) IV 2 g (05/07/20 0558)  . sodium chloride       LOS: 8 days      Debbe Odea, MD [facility] Pager: [email or web address] 05/07/2020, 1:55 PM

## 2020-05-08 ENCOUNTER — Other Ambulatory Visit (INDEPENDENT_AMBULATORY_CARE_PROVIDER_SITE_OTHER): Payer: Self-pay | Admitting: Primary Care

## 2020-05-08 DIAGNOSIS — J189 Pneumonia, unspecified organism: Secondary | ICD-10-CM | POA: Diagnosis not present

## 2020-05-08 DIAGNOSIS — J9621 Acute and chronic respiratory failure with hypoxia: Secondary | ICD-10-CM | POA: Diagnosis not present

## 2020-05-08 DIAGNOSIS — I5031 Acute diastolic (congestive) heart failure: Secondary | ICD-10-CM | POA: Diagnosis not present

## 2020-05-08 DIAGNOSIS — S72141A Displaced intertrochanteric fracture of right femur, initial encounter for closed fracture: Secondary | ICD-10-CM | POA: Diagnosis not present

## 2020-05-08 LAB — CBC
HCT: 25.1 % — ABNORMAL LOW (ref 36.0–46.0)
Hemoglobin: 7.7 g/dL — ABNORMAL LOW (ref 12.0–15.0)
MCH: 29.6 pg (ref 26.0–34.0)
MCHC: 30.7 g/dL (ref 30.0–36.0)
MCV: 96.5 fL (ref 80.0–100.0)
Platelets: 46 10*3/uL — ABNORMAL LOW (ref 150–400)
RBC: 2.6 MIL/uL — ABNORMAL LOW (ref 3.87–5.11)
RDW: 20.2 % — ABNORMAL HIGH (ref 11.5–15.5)
WBC: 14.5 10*3/uL — ABNORMAL HIGH (ref 4.0–10.5)
nRBC: 10.3 % — ABNORMAL HIGH (ref 0.0–0.2)

## 2020-05-08 LAB — BASIC METABOLIC PANEL
Anion gap: 9 (ref 5–15)
BUN: 11 mg/dL (ref 8–23)
CO2: 24 mmol/L (ref 22–32)
Calcium: 8.8 mg/dL — ABNORMAL LOW (ref 8.9–10.3)
Chloride: 102 mmol/L (ref 98–111)
Creatinine, Ser: 0.49 mg/dL (ref 0.44–1.00)
GFR, Estimated: >60 mL/min (ref >60)
Glucose, Bld: 133 mg/dL — ABNORMAL HIGH (ref 70–99)
Potassium: 4.2 mmol/L (ref 3.5–5.1)
Sodium: 135 mmol/L (ref 135–145)

## 2020-05-08 LAB — GLUCOSE, CAPILLARY
Glucose-Capillary: 120 mg/dL — ABNORMAL HIGH (ref 70–99)
Glucose-Capillary: 161 mg/dL — ABNORMAL HIGH (ref 70–99)
Glucose-Capillary: 116 mg/dL — ABNORMAL HIGH (ref 70–99)

## 2020-05-08 MED ORDER — METHYLPREDNISOLONE SODIUM SUCC 125 MG IJ SOLR
125.0000 mg | Freq: Every day | INTRAMUSCULAR | Status: DC
  Administered 2020-05-08 – 2020-05-09 (×2): 125 mg via INTRAVENOUS
  Filled 2020-05-08 (×2): qty 2

## 2020-05-08 NOTE — Progress Notes (Signed)
PROGRESS NOTE    Kathy Irwin   XNT:[id number]  DOB: 04-19-1950  DOA: 04/29/2020 PCP: Kerin Perna, NP   Brief Narrative:  Kathy Irwin is a 70 y.o.femalewith medical history significant ofHTN, interstitial lung disease (hypersensitivity pneumonitis),chronic respiratory failure with hypoxia on 2 L at night,DM type II, ITP,CMML-1,and obesity presents after having a mechanicl fall while at work and right hip pain.   In ED > subtrochanteric fracture of right femur RR in 30s Platelets in 30s.   1/14 underwent IM nail/ percutaneous fixation of femoral neck fractuire 1/15 Hb 6.9 from 8.9 1/16- increasingly lethargic after IV Morphine- code stroke called and notes state she was obtunded- CTA chest revealed multifocal infiltrates consistent with pneumonia, more hypoxic- vitals> 79/36, temp 100.2, HR 151, RR in 30s,  Narcan given with some improvement in lethargy but ongoing confusion-  MRI ordered but patient unable to cooperate  1/17, pulse ox 85% on room air, HR in 130s- pulm consulted- PT recommends SNF, FL2 done 1/18- MRI attempted- pt did not allow moving to exam table   Subjective: She has chest congestion with cough today. She is a lot of pain in her right thigh as well.     Assessment & Plan:   Principal Problem:   Closed intertrochanteric fracture of hip, right, initial encounter  - repaired on 1/14- PT recommended SNF  Active Problems:   Acute on chronic respiratory failure with hypoxemia (HCC)    History of Hypersensitivity pneumonitis     Multifocal pneumonia    Acute on chronic diastolic CHF - aspiration vs CAP -  antibiotics started on 1/17>  Vancomycin and Cefepime - d/c Vancomycin on 1/19- MRSA PCR negative on 1/13 - wean O2 as able- she typically uses 3 L At night at home - started lasix for pulmonary edema noted on CXR and on exam  - follow electrolytes and Cr and wean O2 as able ]- cont Cefepime- more congested today- still hypoxic to 80s on  room air.     Acute metabolic encephalopathy - noted to occur on 1/16 and I suspect it was related to acute infection (pneumonia) with narcotics and hypoxia complicating things - per daughter, the patient was alert and oriented on 1/18- it seems that she became agitated and confused after receiving Ativan to attempt an MRI- will avoid Benzodizepines - this AM she is very sleepy although she last received sedatives (Ativan) yesaterday for the MRI - d/c MRI as at this point I do not think she had a CVA - Using only Tramadol PRN for pain which, per report from daughter does not adversely affect her - awake and alert now    CMML 1 and thrombocytopenia - no treatment indicated fro CMML per oncology note on 1/18 - given 3 U total of pheresed platelets thus far- platelets are in 70,000 range- use steroids for ITP if platelets < 50K - plt have dropped to 46 today-  Will need to give IV Solumedrol today   Anemia - baseline Hb ~ 11 - likely acute blood loss due to fracture in setting of AOCD - has received a total of 4 u PRBC so far - Hb remaining steady at 7-8 range    DM2    Component Value Date/Time   HGBA1C 6.7 (H) 04/30/2020 0334  - cont SSI- holding Amaryl and Metformin     Time spent in minutes: 45 DVT prophylaxis: SCDs Start: 04/30/20 1811 Code Status: Full code Family Communication: daughter at bedside Disposition Plan:  Status is: Inpatient  Remains inpatient appropriate because:IV treatments appropriate due to intensity of illness or inability to take PO   Dispo: The patient is from: Home              Anticipated d/c is to: SNF              Anticipated d/c date is: > 3 days              Patient currently is not medically stable to d/c.  Consultants:   Orthopedic surgery  PCCM Procedures:   IM nair of right hip Antimicrobials:  Anti-infectives (From admission, onward)   Start     Dose/Rate Route Frequency Ordered Stop   05/03/20 1400  vancomycin (VANCOREADY)  IVPB 1750 mg/350 mL  Status:  Discontinued        1,750 mg 175 mL/hr over 120 Minutes Intravenous Every 24 hours 05/02/20 1317 05/05/20 1347   05/02/20 1400  vancomycin (VANCOCIN) 2,500 mg in sodium chloride 0.9 % 500 mL IVPB        2,500 mg 250 mL/hr over 120 Minutes Intravenous  Once 05/02/20 1305 05/02/20 1850   05/02/20 1315  ceFEPIme (MAXIPIME) 2 g in sodium chloride 0.9 % 100 mL IVPB        2 g 200 mL/hr over 30 Minutes Intravenous Every 8 hours 05/02/20 1305     04/30/20 2200  ceFAZolin (ANCEF) IVPB 2g/100 mL premix        2 g 200 mL/hr over 30 Minutes Intravenous Every 8 hours 04/30/20 1810 05/01/20 1443   04/30/20 1438  vancomycin (VANCOCIN) powder  Status:  Discontinued          As needed 04/30/20 1438 04/30/20 1603   04/30/20 0600  ceFAZolin (ANCEF) IVPB 2g/100 mL premix        2 g 200 mL/hr over 30 Minutes Intravenous On call to O.R. 04/29/20 2128 04/30/20 1257       Objective: Vitals:   05/08/20 0914 05/08/20 0922 05/08/20 0936 05/08/20 1057  BP:    (!) 119/47  Pulse: 94 97 82 91  Resp: (!) 33 19 (!) 27 (!) 22  Temp:    97.9 F (36.6 C)  TempSrc:    Oral  SpO2: (!) 79% (!) 81% 92%   Weight:      Height:        Intake/Output Summary (Last 24 hours) at 05/08/2020 1432 Last data filed at 05/08/2020 1100 Gross per 24 hour  Intake 240 ml  Output 2250 ml  Net -2010 ml   Filed Weights   05/06/20 0509 05/07/20 0430 05/08/20 0602  Weight: 119.7 kg 116.8 kg 117.7 kg    Examination: General exam: Appears comfortable  HEENT: PERRLA, oral mucosa moist, no sclera icterus or thrush Respiratory system: crackles at bases Respiratory effort normal. Cardiovascular system: S1 & S2 heard, regular rate and rhythm Gastrointestinal system: Abdomen soft, non-tender, nondistended. Normal bowel sounds   Central nervous system: Alert and oriented. No focal neurological deficits. Extremities: No cyanosis, clubbing-  edema of right thigh present Skin: No rashes or  ulcers Psychiatry:  Mood & affect appropriate.     Data Reviewed: I have personally reviewed following labs and imaging studies  CBC: Recent Labs  Lab 05/02/20 0224 05/03/20 0428 05/04/20 0639 05/05/20 0257 05/06/20 0221 05/07/20 0211 05/08/20 0230  WBC 13.9*   < > 17.6* 17.2* 16.6* 14.2* 14.5*  NEUTROABS 6.4  --  10.3* 9.0*  --   --   --  HGB 7.1*   < > 7.5* 7.4* 7.3* 7.3* 7.7*  HCT 21.0*   < > 23.6* 23.8* 23.9* 22.7* 25.1*  MCV 94.6   < > 94.0 94.4 94.8 94.6 96.5  PLT 65*   < > 77* 76* 67* 59* 46*   < > = values in this interval not displayed.   Basic Metabolic Panel: Recent Labs  Lab 05/02/20 0224 05/03/20 0220 05/03/20 1114 05/04/20 0639 05/05/20 0257 05/06/20 0221 05/07/20 0211 05/08/20 0230  NA 137   < >  --  137 137 137 136 135  K 4.6   < >  --  4.2 4.2 4.2 4.0 4.2  CL 106   < >  --  107 109 107 104 102  CO2 24   < >  --  _0 GLUCOSE 194*   < >  --  168* 152* 139* 174* 133*  BUN 14   < >  --  _1 CREATININE 0.71   < >  --  0.52 0.58 0.50 0.58 0.49  CALCIUM 8.6*   < >  --  8.5* 8.5* 8.4* 8.7* 8.8*  MG 2.1  --  2.0 1.7 1.6*  --   --   --   PHOS 2.1*  --  1.4* 2.4* 2.1*  --   --   --    < > = values in this interval not displayed.   GFR: Estimated Creatinine Clearance: 82.2 mL/min (by C-G formula based on SCr of 0.49 mg/dL). Liver Function Tests: Recent Labs  Lab 05/02/20 0224 05/03/20 1114 05/04/20 0639 05/05/20 0257  AST 34 44* 37 43*  ALT _2 ALKPHOS 43 46 46 51  BILITOT 1.2 1.7* 1.1 1.3*  PROT 5.3* 5.5* 5.4* 5.4*  ALBUMIN 2.6* 2.3* 2.2* 2.1*   No results for input(s): LIPASE, AMYLASE in the last 168 hours. No results for input(s): AMMONIA in the last 168 hours. Coagulation Profile: No results for input(s): INR, PROTIME in the last 168 hours. Cardiac Enzymes: No results for input(s): CKTOTAL, CKMB, CKMBINDEX, TROPONINI in the last 168 hours. BNP (last 3 results) No results for input(s): PROBNP in the last  8760 hours. HbA1C: No results for input(s): HGBA1C in the last 72 hours. CBG: Recent Labs  Lab 05/07/20 1122 05/07/20 1709 05/07/20 2215 05/08/20 0737 05/08/20 1313  GLUCAP 160* 143* 158* 120* 161*   Lipid Profile: No results for input(s): CHOL, HDL, LDLCALC, TRIG, CHOLHDL, LDLDIRECT in the last 72 hours. Thyroid Function Tests: No results for input(s): TSH, T4TOTAL, FREET4, T3FREE, THYROIDAB in the last 72 hours. Anemia Panel: No results for input(s): VITAMINB12, FOLATE, FERRITIN, TIBC, IRON, RETICCTPCT in the last 72 hours. Urine analysis:    Component Value Date/Time   COLORURINE YELLOW 05/04/2020 0050   APPEARANCEUR CLEAR 05/04/2020 0050   LABSPEC 1.014 05/04/2020 0050   PHURINE 7.0 05/04/2020 0050   GLUCOSEU NEGATIVE 05/04/2020 0050   HGBUR NEGATIVE 05/04/2020 0050   BILIRUBINUR NEGATIVE 05/04/2020 0050   KETONESUR NEGATIVE 05/04/2020 0050   PROTEINUR NEGATIVE 05/04/2020 0050   UROBILINOGEN 1.0 08/14/2019 1824   NITRITE NEGATIVE 05/04/2020 0050   LEUKOCYTESUR NEGATIVE 05/04/2020 0050   Sepsis Labs: _3 (procalcitonin:4,lacticidven:4) ) Recent Results (from the past 240 hour(s))  Resp Panel by RT-PCR (Flu A&B, Covid) Nasopharyngeal Swab     Status: None   Collection Time: 04/29/20 12:59 PM   Specimen: Nasopharyngeal Swab; Nasopharyngeal(NP) swabs in vial transport medium  Result Value  Ref Range Status   SARS Coronavirus 2 by RT PCR NEGATIVE NEGATIVE Final    Comment: (NOTE) SARS-CoV-2 target nucleic acids are NOT DETECTED.  The SARS-CoV-2 RNA is generally detectable in upper respiratory specimens during the acute phase of infection. The lowest concentration of SARS-CoV-2 viral copies this assay can detect is 138 copies/mL. A negative result does not preclude SARS-Cov-2 infection and should not be used as the sole basis for treatment or other patient management decisions. A negative result may occur with  improper specimen collection/handling, submission  of specimen other than nasopharyngeal swab, presence of viral mutation(s) within the areas targeted by this assay, and inadequate number of viral copies(<138 copies/mL). A negative result must be combined with clinical observations, patient history, and epidemiological information. The expected result is Negative.  Fact Sheet for Patients:  EntrepreneurPulse.com.au  Fact Sheet for Healthcare Providers:  IncredibleEmployment.be  This test is no t yet approved or cleared by the [place] FDA and  has been authorized for detection and/or diagnosis of SARS-CoV-2 by FDA under an Emergency Use Authorization (EUA). This EUA will remain  in effect (meaning this test can be used) for the duration of the COVID-19 declaration under Section 564(b)(1) of the Act, 21 U.S.C.section 360bbb-3(b)(1), unless the authorization is terminated  or revoked sooner.       Influenza A by PCR NEGATIVE NEGATIVE Final   Influenza B by PCR NEGATIVE NEGATIVE Final    Comment: (NOTE) The Xpert Xpress SARS-CoV-2/FLU/RSV plus assay is intended as an aid in the diagnosis of influenza from Nasopharyngeal swab specimens and should not be used as a sole basis for treatment. Nasal washings and aspirates are unacceptable for Xpert Xpress SARS-CoV-2/FLU/RSV testing.  Fact Sheet for Patients: EntrepreneurPulse.com.au  Fact Sheet for Healthcare Providers: IncredibleEmployment.be  This test is not yet approved or cleared by the [place] FDA and has been authorized for detection and/or diagnosis of SARS-CoV-2 by FDA under an Emergency Use Authorization (EUA). This EUA will remain in effect (meaning this test can be used) for the duration of the COVID-19 declaration under Section 564(b)(1) of the Act, 21 U.S.C. section 360bbb-3(b)(1), unless the authorization is terminated or revoked.  Performed at [facility], [place] [address]  [place], [place] [address]   Surgical pcr screen     Status: None   Collection Time: 04/29/20 10:38 PM   Specimen: Nasal Mucosa; Nasal Swab  Result Value Ref Range Status   MRSA, PCR NEGATIVE NEGATIVE Final   Staphylococcus aureus NEGATIVE NEGATIVE Final    Comment: (NOTE) The Xpert SA Assay (FDA approved for NASAL specimens in patients 52 years of age and older), is one component of a comprehensive surveillance program. It is not intended to diagnose infection nor to guide or monitor treatment. Performed at [facility], [place] [address] [place], [place] [address]   Resp Panel by RT-PCR (Flu A&B, Covid) Nasopharyngeal Swab     Status: None   Collection Time: 05/02/20  1:01 PM   Specimen: Nasopharyngeal Swab; Nasopharyngeal(NP) swabs in vial transport medium  Result Value Ref Range Status   SARS Coronavirus 2 by RT PCR NEGATIVE NEGATIVE Final    Comment: (NOTE) SARS-CoV-2 target nucleic acids are NOT DETECTED.  The SARS-CoV-2 RNA is generally detectable in upper respiratory specimens during the acute phase of infection. The lowest concentration of SARS-CoV-2 viral copies this assay can detect is 138 copies/mL. A negative result does not preclude SARS-Cov-2 infection and should not be used as the  sole basis for treatment or other patient management decisions. A negative result may occur with  improper specimen collection/handling, submission of specimen other than nasopharyngeal swab, presence of viral mutation(s) within the areas targeted by this assay, and inadequate number of viral copies(<138 copies/mL). A negative result must be combined with clinical observations, patient history, and epidemiological information. The expected result is Negative.  Fact Sheet for Patients:  EntrepreneurPulse.com.au  Fact Sheet for Healthcare Providers:  IncredibleEmployment.be  This test is no t yet approved or cleared by the [place] FDA and  has  been authorized for detection and/or diagnosis of SARS-CoV-2 by FDA under an Emergency Use Authorization (EUA). This EUA will remain  in effect (meaning this test can be used) for the duration of the COVID-19 declaration under Section 564(b)(1) of the Act, 21 U.S.C.section 360bbb-3(b)(1), unless the authorization is terminated  or revoked sooner.       Influenza A by PCR NEGATIVE NEGATIVE Final   Influenza B by PCR NEGATIVE NEGATIVE Final    Comment: (NOTE) The Xpert Xpress SARS-CoV-2/FLU/RSV plus assay is intended as an aid in the diagnosis of influenza from Nasopharyngeal swab specimens and should not be used as a sole basis for treatment. Nasal washings and aspirates are unacceptable for Xpert Xpress SARS-CoV-2/FLU/RSV testing.  Fact Sheet for Patients: EntrepreneurPulse.com.au  Fact Sheet for Healthcare Providers: IncredibleEmployment.be  This test is not yet approved or cleared by the [place] FDA and has been authorized for detection and/or diagnosis of SARS-CoV-2 by FDA under an Emergency Use Authorization (EUA). This EUA will remain in effect (meaning this test can be used) for the duration of the COVID-19 declaration under Section 564(b)(1) of the Act, 21 U.S.C. section 360bbb-3(b)(1), unless the authorization is terminated or revoked.  Performed at [facility], [place] [address], [place], [place] [address]   Respiratory (~20 pathogens) panel by PCR     Status: None   Collection Time: 05/02/20  1:01 PM   Specimen: Nasopharyngeal Swab; Respiratory  Result Value Ref Range Status   Adenovirus NOT DETECTED NOT DETECTED Final   Coronavirus 229E NOT DETECTED NOT DETECTED Final    Comment: (NOTE) The Coronavirus on the Respiratory Panel, DOES NOT test for the novel  Coronavirus (2019 nCoV)    Coronavirus HKU1 NOT DETECTED NOT DETECTED Final   Coronavirus NL63 NOT DETECTED NOT DETECTED Final   Coronavirus OC43 NOT DETECTED  NOT DETECTED Final   Metapneumovirus NOT DETECTED NOT DETECTED Final   Rhinovirus / Enterovirus NOT DETECTED NOT DETECTED Final   Influenza A NOT DETECTED NOT DETECTED Final   Influenza B NOT DETECTED NOT DETECTED Final   Parainfluenza Virus 1 NOT DETECTED NOT DETECTED Final   Parainfluenza Virus 2 NOT DETECTED NOT DETECTED Final   Parainfluenza Virus 3 NOT DETECTED NOT DETECTED Final   Parainfluenza Virus 4 NOT DETECTED NOT DETECTED Final   Respiratory Syncytial Virus NOT DETECTED NOT DETECTED Final   Bordetella pertussis NOT DETECTED NOT DETECTED Final   Bordetella Parapertussis NOT DETECTED NOT DETECTED Final   Chlamydophila pneumoniae NOT DETECTED NOT DETECTED Final   Mycoplasma pneumoniae NOT DETECTED NOT DETECTED Final    Comment: Performed at [facility], [place]. [address] [place], [place] [address]  Culture, blood (routine x 2)     Status: None   Collection Time: 05/02/20  2:15 PM   Specimen: BLOOD  Result Value Ref Range Status   Specimen Description BLOOD LEFT ANTECUBITAL  Final   Special Requests  Final    BOTTLES DRAWN AEROBIC AND ANAEROBIC Blood Culture results may not be optimal due to an inadequate volume of blood received in culture bottles   Culture   Final    NO GROWTH 5 DAYS Performed at [facility], [place] [address] [place], [place] [address]    Report Status 05/07/2020 FINAL  Final  Culture, blood (routine x 2)     Status: None   Collection Time: 05/02/20  2:31 PM   Specimen: BLOOD LEFT HAND  Result Value Ref Range Status   Specimen Description BLOOD LEFT HAND  Final   Special Requests   Final    BOTTLES DRAWN AEROBIC ONLY Blood Culture adequate volume   Culture   Final    NO GROWTH 5 DAYS Performed at [facility], [place] [address] [place], [place] [address]    Report Status 05/07/2020 FINAL  Final         Radiology Studies: No results found.    Scheduled Meds: . sodium chloride   Intravenous Once  . sodium chloride    Intravenous Once  . aspirin EC  81 mg Oral Daily  . diphenhydrAMINE  25 mg Intravenous Once  . docusate sodium  100 mg Oral BID  . famotidine  40 mg Oral Daily  . furosemide  40 mg Intravenous BID  . insulin aspart  0-9 Units Subcutaneous TID WC  . linaclotide  145 mcg Oral QAC breakfast  . metoprolol succinate  75 mg Oral Daily  . montelukast  10 mg Oral QHS  . pantoprazole  40 mg Oral Daily  . potassium & sodium phosphates  1 packet Oral TID WC & HS  . pravastatin  40 mg Oral QHS  . Ensure Max Protein  11 oz Oral TID  . senna-docusate  1 tablet Oral Q0600  . sertraline  100 mg Oral Daily   Continuous Infusions: . ceFEPime (MAXIPIME) IV 2 g (05/08/20 1409)  . sodium chloride       LOS: 9 days      Debbe Odea, MD [facility] Pager: [email or web address] 05/08/2020, 2:32 PM

## 2020-05-08 NOTE — Progress Notes (Signed)
Physical Therapy Treatment Patient Details Name: Kathy Irwin MRN: [id number] DOB: 1951-03-15 Today's Date: 05/08/2020    History of Present Illness 70 yo female with mechanical fall was admitted for IM nailing of her R intertrochanteric fracture. 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    PT Comments    Pt supine in bed on arrival.  Pt reports need to use BSC.  Pt had small BM during session.  Informed nursing.  Pt performed all mobility with min to mod assistance.  Continue to recommend snf placement at d/c.     Follow Up Recommendations  SNF     Equipment Recommendations  None recommended by PT    Recommendations for Other Services       Precautions / Restrictions Precautions Precautions: Fall Precaution Comments: lethargic Restrictions Weight Bearing Restrictions: Yes RLE Weight Bearing: Weight bearing as tolerated    Mobility  Bed Mobility Overal bed mobility: Needs Assistance Bed Mobility: Supine to Sit     Supine to sit: Mod assist;+2 for physical assistance Sit to supine: Mod assist   General bed mobility comments: Assistance for B LEs advancement and trunk elevation to edge of bed.  Pt listing to the L and required cues to sit straight and allow weight to shift to her R side.  Transfers Overall transfer level: Needs assistance Equipment used: Rolling walker (2 wheeled) Transfers: Sit to/from [facility] Sit to Stand: Mod assist;+2 safety/equipment Stand pivot transfers: Min assist;+2 safety/equipment       General transfer comment: Pt required cues for hand placement and despite cues to push from seated surface.  Pt observed holding to RW hand grips to and from seated surface.  Poor eccentric load noted.  Ambulation/Gait Ambulation/Gait assistance: Min assist Gait Distance (Feet): 4 Feet (x2 Bed>BSC> recliner) Assistive device: Rolling walker (2 wheeled) Gait Pattern/deviations: Step-to  pattern;Antalgic;Decreased stride length     General Gait Details: Series of forward, sidestepping and backwards stepping to move from bed to bsc to recliner.   Stairs             Wheelchair Mobility    Modified Rankin (Stroke Patients Only)       Balance Overall balance assessment: Needs assistance Sitting-balance support: No upper extremity supported;Feet supported Sitting balance-Leahy Scale: Poor Sitting balance - Comments: L lateral leaning     Standing balance-Leahy Scale: Poor Standing balance comment: walker and +2 min assist                            Cognition Arousal/Alertness: Awake/alert Behavior During Therapy: Flat affect Overall Cognitive Status: Impaired/Different from baseline Area of Impairment: Problem solving;Memory;Attention                 Orientation Level: Situation;Time Current Attention Level: Selective Memory: Decreased short-term memory Following Commands: Follows one step commands consistently Safety/Judgement: Decreased awareness of safety;Decreased awareness of deficits Awareness: Emergent Problem Solving: Requires verbal cues        Exercises      General Comments        Pertinent Vitals/Pain Pain Assessment: Faces Faces Pain Scale: Hurts whole lot Pain Location: rt hip Pain Descriptors / Indicators: Operative site guarding;Grimacing;Guarding Pain Intervention(s): Monitored during session;Repositioned    Home Living                      Prior Function  PT Goals (current goals can now be found in the care plan section) Acute Rehab PT Goals Patient Stated Goal: to get home Potential to Achieve Goals: Good Progress towards PT goals: Progressing toward goals    Frequency    Min 3X/week      PT Plan Current plan remains appropriate    Co-evaluation              AM-PAC PT "6 Clicks" Mobility   Outcome Measure  Help needed turning from your back to your side  while in a flat bed without using bedrails?: A Lot Help needed moving from lying on your back to sitting on the side of a flat bed without using bedrails?: A Lot Help needed moving to and from a bed to a chair (including a wheelchair)?: A Little Help needed standing up from a chair using your arms (e.g., wheelchair or bedside chair)?: A Lot Help needed to walk in hospital room?: A Little Help needed climbing 3-5 steps with a railing? : Total 6 Click Score: 13    End of Session Equipment Utilized During Treatment: Oxygen Activity Tolerance: Patient tolerated treatment well Patient left: with call bell/phone within reach;in bed;with bed alarm set Nurse Communication: Mobility status PT Visit Diagnosis: Muscle weakness (generalized) (M62.81);Pain;History of falling (Z91.81) Pain - Right/Left: Right Pain - part of body: Hip     Time: 1015-1040 PT Time Calculation (min) (ACUTE ONLY): 25 min  Charges:  $Gait Training: 8-22 mins $Therapeutic Activity: 8-22 mins                     Erasmo Leventhal , PTA Acute Rehabilitation Services Pager [phone] Office [phone]     Deontre Allsup Eli Hose 05/08/2020, 12:35 PM

## 2020-05-09 ENCOUNTER — Other Ambulatory Visit (INDEPENDENT_AMBULATORY_CARE_PROVIDER_SITE_OTHER): Payer: Self-pay | Admitting: Primary Care

## 2020-05-09 DIAGNOSIS — J189 Pneumonia, unspecified organism: Secondary | ICD-10-CM | POA: Diagnosis not present

## 2020-05-09 DIAGNOSIS — S72141A Displaced intertrochanteric fracture of right femur, initial encounter for closed fracture: Secondary | ICD-10-CM | POA: Diagnosis not present

## 2020-05-09 DIAGNOSIS — I5031 Acute diastolic (congestive) heart failure: Secondary | ICD-10-CM | POA: Diagnosis not present

## 2020-05-09 DIAGNOSIS — J9621 Acute and chronic respiratory failure with hypoxia: Secondary | ICD-10-CM | POA: Diagnosis not present

## 2020-05-09 DIAGNOSIS — J9611 Chronic respiratory failure with hypoxia: Secondary | ICD-10-CM

## 2020-05-09 LAB — CBC
HCT: 26.2 % — ABNORMAL LOW (ref 36.0–46.0)
Hemoglobin: 8.4 g/dL — ABNORMAL LOW (ref 12.0–15.0)
MCH: 30.4 pg (ref 26.0–34.0)
MCHC: 32.1 g/dL (ref 30.0–36.0)
MCV: 94.9 fL (ref 80.0–100.0)
Platelets: 48 10*3/uL — ABNORMAL LOW (ref 150–400)
RBC: 2.76 MIL/uL — ABNORMAL LOW (ref 3.87–5.11)
RDW: 20.3 % — ABNORMAL HIGH (ref 11.5–15.5)
WBC: 15.6 10*3/uL — ABNORMAL HIGH (ref 4.0–10.5)
nRBC: 7 % — ABNORMAL HIGH (ref 0.0–0.2)

## 2020-05-09 LAB — BASIC METABOLIC PANEL
Anion gap: 10 (ref 5–15)
BUN: 13 mg/dL (ref 8–23)
CO2: 25 mmol/L (ref 22–32)
Calcium: 9.3 mg/dL (ref 8.9–10.3)
Chloride: 100 mmol/L (ref 98–111)
Creatinine, Ser: 0.6 mg/dL (ref 0.44–1.00)
GFR, Estimated: >60 mL/min (ref >60)
Glucose, Bld: 193 mg/dL — ABNORMAL HIGH (ref 70–99)
Potassium: 4.7 mmol/L (ref 3.5–5.1)
Sodium: 135 mmol/L (ref 135–145)

## 2020-05-09 LAB — GLUCOSE, CAPILLARY
Glucose-Capillary: 137 mg/dL — ABNORMAL HIGH (ref 70–99)
Glucose-Capillary: 224 mg/dL — ABNORMAL HIGH (ref 70–99)
Glucose-Capillary: 236 mg/dL — ABNORMAL HIGH (ref 70–99)
Glucose-Capillary: 293 mg/dL — ABNORMAL HIGH (ref 70–99)

## 2020-05-09 MED ORDER — SODIUM CHLORIDE 0.9 % IV SOLN
1000.0000 mg | Freq: Every day | INTRAVENOUS | Status: AC
  Administered 2020-05-09: 1000 mg via INTRAVENOUS
  Filled 2020-05-09 (×2): qty 8

## 2020-05-09 MED ORDER — SODIUM CHLORIDE 0.9 % IV SOLN: 1000.0000 mg | Freq: Every day | INTRAVENOUS | Status: DC

## 2020-05-09 NOTE — Progress Notes (Signed)
PROGRESS NOTE    Kathy Irwin   JDB:[id number]  DOB: 12/12/1950  DOA: 04/29/2020 PCP: Kerin Perna, NP   Brief Narrative:  Kathy Irwin is a 70 y.o.femalewith medical history significant ofHTN, interstitial lung disease (hypersensitivity pneumonitis),chronic respiratory failure with hypoxia on 2 L at night,DM type II, ITP,CMML-1,and obesity presents after having a mechanicl fall while at work and right hip pain.   In ED > subtrochanteric fracture of right femur RR in 30s Platelets in 30s.   1/14 underwent IM nail/ percutaneous fixation of femoral neck fractuire 1/15 Hb 6.9 from 8.9 1/16- increasingly lethargic after IV Morphine- code stroke called and notes state she was obtunded- CTA chest revealed multifocal infiltrates consistent with pneumonia, more hypoxic- vitals> 79/36, temp 100.2, HR 151, RR in 30s,  Narcan given with some improvement in lethargy but ongoing confusion-  MRI ordered but patient unable to cooperate  1/17, pulse ox 85% on room air, HR in 130s- pulm consulted- PT recommends SNF, FL2 done 1/18- MRI attempted- pt did not allow moving to exam table   Subjective: She is still having some cough with yellow sputum.     Assessment & Plan:   Principal Problem:   Closed intertrochanteric fracture of hip, right, initial encounter  - repaired on 1/14- PT recommended SNF  Active Problems:   Acute on chronic respiratory failure with hypoxemia (HCC)    History of Hypersensitivity pneumonitis     Multifocal pneumonia    Acute on chronic diastolic CHF - aspiration vs CAP -  antibiotics started on 1/17>  Vancomycin and Cefepime - d/c Vancomycin on 1/19- MRSA PCR negative on 1/13 - wean O2 as able- she typically uses 3 L At night at home - started lasix for pulmonary edema noted on CXR and on exam  - follow electrolytes and Cr and wean O2 as able ]- cont Cefepime-     Acute metabolic encephalopathy - noted to occur on 1/16 and I suspect it was  related to acute infection (pneumonia) with narcotics and hypoxia complicating things - per daughter, the patient was alert and oriented on 1/18- it seems that she became agitated and confused after receiving Ativan to attempt an MRI- will avoid Benzodizepines - this AM she is very sleepy although she last received sedatives (Ativan) yesaterday for the MRI - d/c MRI as at this point I do not think she had a CVA - Using only Tramadol PRN for pain which, per report from daughter does not adversely affect her - awake and alert now    CMML 1 and thrombocytopenia - no treatment indicated fro CMML per oncology note on 1/18 - given 3 U total of pheresed platelets thus far- platelets are in 70,000 range- use steroids for ITP if platelets < 50K - plt have dropped to 46 on 1/22 a are 48 today- Cont IV Solumedrol 1 gm daily   Anemia - baseline Hb ~ 11 - likely acute blood loss due to fracture in setting of AOCD - has received a total of 4 u PRBC so far - Hb remaining steady at 7-8 range    DM2    Component Value Date/Time   HGBA1C 6.7 (H) 04/30/2020 0334  - cont SSI- holding Amaryl and Metformin     Time spent in minutes: 45 DVT prophylaxis: SCDs Start: 04/30/20 1811 Code Status: Full code Family Communication: daughter at bedside Disposition Plan:  Status is: Inpatient  Remains inpatient appropriate because:IV treatments appropriate due to intensity of illness or inability  to take PO   Dispo: The patient is from: Home              Anticipated d/c is to: SNF              Anticipated d/c date is: > 3 days              Patient currently is not medically stable to d/c.  Consultants:   Orthopedic surgery  PCCM Procedures:   IM nair of right hip Antimicrobials:  Anti-infectives (From admission, onward)   Start     Dose/Rate Route Frequency Ordered Stop   05/03/20 1400  vancomycin (VANCOREADY) IVPB 1750 mg/350 mL  Status:  Discontinued        1,750 mg 175 mL/hr over 120 Minutes  Intravenous Every 24 hours 05/02/20 1317 05/05/20 1347   05/02/20 1400  vancomycin (VANCOCIN) 2,500 mg in sodium chloride 0.9 % 500 mL IVPB        2,500 mg 250 mL/hr over 120 Minutes Intravenous  Once 05/02/20 1305 05/02/20 1850   05/02/20 1315  ceFEPIme (MAXIPIME) 2 g in sodium chloride 0.9 % 100 mL IVPB        2 g 200 mL/hr over 30 Minutes Intravenous Every 8 hours 05/02/20 1305     04/30/20 2200  ceFAZolin (ANCEF) IVPB 2g/100 mL premix        2 g 200 mL/hr over 30 Minutes Intravenous Every 8 hours 04/30/20 1810 05/01/20 1443   04/30/20 1438  vancomycin (VANCOCIN) powder  Status:  Discontinued          As needed 04/30/20 1438 04/30/20 1603   04/30/20 0600  ceFAZolin (ANCEF) IVPB 2g/100 mL premix        2 g 200 mL/hr over 30 Minutes Intravenous On call to O.R. 04/29/20 2128 04/30/20 1257       Objective: Vitals:   05/08/20 1200 05/08/20 2153 05/08/20 2358 05/09/20 0700  BP: 131/75 (!) 141/62 (!) 142/66 119/63  Pulse: 91 79 77 76  Resp: (!) _0 Temp:  98.4 F (36.9 C) 98.4 F (36.9 C) (!) 97.5 F (36.4 C)  TempSrc:  Oral Oral Oral  SpO2: 91% 95% 95%   Weight:      Height:        Intake/Output Summary (Last 24 hours) at 05/09/2020 1154 Last data filed at 05/09/2020 0600 Gross per 24 hour  Intake 750 ml  Output 4000 ml  Net -3250 ml   Filed Weights   05/06/20 0509 05/07/20 0430 05/08/20 0602  Weight: 119.7 kg 116.8 kg 117.7 kg    Examination: General exam: Appears comfortable  HEENT: PERRLA, oral mucosa moist, no sclera icterus or thrush Respiratory system: Clear to auscultation. Respiratory effort normal. Cardiovascular system: S1 & S2 heard, regular rate and rhythm Gastrointestinal system: Abdomen soft, non-tender, nondistended. Normal bowel sounds   Central nervous system: Alert and oriented. No focal neurological deficits. Extremities: No cyanosis, clubbing or edema Skin: No rashes or ulcers Psychiatry:  Mood & affect appropriate.    Data Reviewed:  I have personally reviewed following labs and imaging studies  CBC: Recent Labs  Lab 05/04/20 0639 05/05/20 0257 05/06/20 0221 05/07/20 0211 05/08/20 0230 05/09/20 0239  WBC 17.6* 17.2* 16.6* 14.2* 14.5* 15.6*  NEUTROABS 10.3* 9.0*  --   --   --   --   HGB 7.5* 7.4* 7.3* 7.3* 7.7* 8.4*  HCT 23.6* 23.8* 23.9* 22.7* 25.1* 26.2*  MCV 94.0 94.4 94.8 94.6 96.5  94.9  PLT 77* 76* 67* 59* 46* 48*   Basic Metabolic Panel: Recent Labs  Lab 05/03/20 1114 05/04/20 0639 05/05/20 0257 05/06/20 0221 05/07/20 0211 05/08/20 0230 05/09/20 0239  NA  --  137 137 137 136 135 135  K  --  4.2 4.2 4.2 4.0 4.2 4.7  CL  --  107 109 107 104 102 100  CO2  --  _0 GLUCOSE  --  168* 152* 139* 174* 133* 193*  BUN  --  _1 CREATININE  --  0.52 0.58 0.50 0.58 0.49 0.60  CALCIUM  --  8.5* 8.5* 8.4* 8.7* 8.8* 9.3  MG 2.0 1.7 1.6*  --   --   --   --   PHOS 1.4* 2.4* 2.1*  --   --   --   --    GFR: Estimated Creatinine Clearance: 82.2 mL/min (by C-G formula based on SCr of 0.6 mg/dL). Liver Function Tests: Recent Labs  Lab 05/03/20 1114 05/04/20 0639 05/05/20 0257  AST 44* 37 43*  ALT _2 ALKPHOS 46 46 51  BILITOT 1.7* 1.1 1.3*  PROT 5.5* 5.4* 5.4*  ALBUMIN 2.3* 2.2* 2.1*   No results for input(s): LIPASE, AMYLASE in the last 168 hours. No results for input(s): AMMONIA in the last 168 hours. Coagulation Profile: No results for input(s): INR, PROTIME in the last 168 hours. Cardiac Enzymes: No results for input(s): CKTOTAL, CKMB, CKMBINDEX, TROPONINI in the last 168 hours. BNP (last 3 results) No results for input(s): PROBNP in the last 8760 hours. HbA1C: No results for input(s): HGBA1C in the last 72 hours. CBG: Recent Labs  Lab 05/07/20 2215 05/08/20 0737 05/08/20 1313 05/08/20 1637 05/09/20 0725  GLUCAP 158* 120* 161* 116* 137*   Lipid Profile: No results for input(s): CHOL, HDL, LDLCALC, TRIG, CHOLHDL, LDLDIRECT in the last 72 hours. Thyroid  Function Tests: No results for input(s): TSH, T4TOTAL, FREET4, T3FREE, THYROIDAB in the last 72 hours. Anemia Panel: No results for input(s): VITAMINB12, FOLATE, FERRITIN, TIBC, IRON, RETICCTPCT in the last 72 hours. Urine analysis:    Component Value Date/Time   COLORURINE YELLOW 05/04/2020 0050   APPEARANCEUR CLEAR 05/04/2020 0050   LABSPEC 1.014 05/04/2020 0050   PHURINE 7.0 05/04/2020 0050   GLUCOSEU NEGATIVE 05/04/2020 0050   HGBUR NEGATIVE 05/04/2020 0050   BILIRUBINUR NEGATIVE 05/04/2020 0050   KETONESUR NEGATIVE 05/04/2020 0050   PROTEINUR NEGATIVE 05/04/2020 0050   UROBILINOGEN 1.0 08/14/2019 1824   NITRITE NEGATIVE 05/04/2020 0050   LEUKOCYTESUR NEGATIVE 05/04/2020 0050   Sepsis Labs: _3 (procalcitonin:4,lacticidven:4) ) Recent Results (from the past 240 hour(s))  Resp Panel by RT-PCR (Flu A&B, Covid) Nasopharyngeal Swab     Status: None   Collection Time: 04/29/20 12:59 PM   Specimen: Nasopharyngeal Swab; Nasopharyngeal(NP) swabs in vial transport medium  Result Value Ref Range Status   SARS Coronavirus 2 by RT PCR NEGATIVE NEGATIVE Final    Comment: (NOTE) SARS-CoV-2 target nucleic acids are NOT DETECTED.  The SARS-CoV-2 RNA is generally detectable in upper respiratory specimens during the acute phase of infection. The lowest concentration of SARS-CoV-2 viral copies this assay can detect is 138 copies/mL. A negative result does not preclude SARS-Cov-2 infection and should not be used as the sole basis for treatment or other patient management decisions. A negative result may occur with  improper specimen collection/handling, submission of specimen other than nasopharyngeal swab, presence of viral mutation(s) within the  areas targeted by this assay, and inadequate number of viral copies(<138 copies/mL). A negative result must be combined with clinical observations, patient history, and epidemiological information. The expected result is Negative.  Fact  Sheet for Patients:  EntrepreneurPulse.com.au  Fact Sheet for Healthcare Providers:  IncredibleEmployment.be  This test is no t yet approved or cleared by the [place] FDA and  has been authorized for detection and/or diagnosis of SARS-CoV-2 by FDA under an Emergency Use Authorization (EUA). This EUA will remain  in effect (meaning this test can be used) for the duration of the COVID-19 declaration under Section 564(b)(1) of the Act, 21 U.S.C.section 360bbb-3(b)(1), unless the authorization is terminated  or revoked sooner.       Influenza A by PCR NEGATIVE NEGATIVE Final   Influenza B by PCR NEGATIVE NEGATIVE Final    Comment: (NOTE) The Xpert Xpress SARS-CoV-2/FLU/RSV plus assay is intended as an aid in the diagnosis of influenza from Nasopharyngeal swab specimens and should not be used as a sole basis for treatment. Nasal washings and aspirates are unacceptable for Xpert Xpress SARS-CoV-2/FLU/RSV testing.  Fact Sheet for Patients: EntrepreneurPulse.com.au  Fact Sheet for Healthcare Providers: IncredibleEmployment.be  This test is not yet approved or cleared by the [place] FDA and has been authorized for detection and/or diagnosis of SARS-CoV-2 by FDA under an Emergency Use Authorization (EUA). This EUA will remain in effect (meaning this test can be used) for the duration of the COVID-19 declaration under Section 564(b)(1) of the Act, 21 U.S.C. section 360bbb-3(b)(1), unless the authorization is terminated or revoked.  Performed at [facility], [place] [address], [place], [place] [address]   Surgical pcr screen     Status: None   Collection Time: 04/29/20 10:38 PM   Specimen: Nasal Mucosa; Nasal Swab  Result Value Ref Range Status   MRSA, PCR NEGATIVE NEGATIVE Final   Staphylococcus aureus NEGATIVE NEGATIVE Final    Comment: (NOTE) The Xpert SA Assay (FDA approved for NASAL  specimens in patients 71 years of age and older), is one component of a comprehensive surveillance program. It is not intended to diagnose infection nor to guide or monitor treatment. Performed at [facility], [place] [address], [place], [place] [address]   Resp Panel by RT-PCR (Flu A&B, Covid) Nasopharyngeal Swab     Status: None   Collection Time: 05/02/20  1:01 PM   Specimen: Nasopharyngeal Swab; Nasopharyngeal(NP) swabs in vial transport medium  Result Value Ref Range Status   SARS Coronavirus 2 by RT PCR NEGATIVE NEGATIVE Final    Comment: (NOTE) SARS-CoV-2 target nucleic acids are NOT DETECTED.  The SARS-CoV-2 RNA is generally detectable in upper respiratory specimens during the acute phase of infection. The lowest concentration of SARS-CoV-2 viral copies this assay can detect is 138 copies/mL. A negative result does not preclude SARS-Cov-2 infection and should not be used as the sole basis for treatment or other patient management decisions. A negative result may occur with  improper specimen collection/handling, submission of specimen other than nasopharyngeal swab, presence of viral mutation(s) within the areas targeted by this assay, and inadequate number of viral copies(<138 copies/mL). A negative result must be combined with clinical observations, patient history, and epidemiological information. The expected result is Negative.  Fact Sheet for Patients:  EntrepreneurPulse.com.au  Fact Sheet for Healthcare Providers:  IncredibleEmployment.be  This test is no t yet approved or cleared by the [place] FDA and  has been authorized for detection and/or diagnosis of SARS-CoV-2  by FDA under an Emergency Use Authorization (EUA). This EUA will remain  in effect (meaning this test can be used) for the duration of the COVID-19 declaration under Section 564(b)(1) of the Act, 21 U.S.C.section 360bbb-3(b)(1), unless the authorization  is terminated  or revoked sooner.       Influenza A by PCR NEGATIVE NEGATIVE Final   Influenza B by PCR NEGATIVE NEGATIVE Final    Comment: (NOTE) The Xpert Xpress SARS-CoV-2/FLU/RSV plus assay is intended as an aid in the diagnosis of influenza from Nasopharyngeal swab specimens and should not be used as a sole basis for treatment. Nasal washings and aspirates are unacceptable for Xpert Xpress SARS-CoV-2/FLU/RSV testing.  Fact Sheet for Patients: EntrepreneurPulse.com.au  Fact Sheet for Healthcare Providers: IncredibleEmployment.be  This test is not yet approved or cleared by the [place] FDA and has been authorized for detection and/or diagnosis of SARS-CoV-2 by FDA under an Emergency Use Authorization (EUA). This EUA will remain in effect (meaning this test can be used) for the duration of the COVID-19 declaration under Section 564(b)(1) of the Act, 21 U.S.C. section 360bbb-3(b)(1), unless the authorization is terminated or revoked.  Performed at [facility], [place] [address] [place], [place] [address]   Respiratory (~20 pathogens) panel by PCR     Status: None   Collection Time: 05/02/20  1:01 PM   Specimen: Nasopharyngeal Swab; Respiratory  Result Value Ref Range Status   Adenovirus NOT DETECTED NOT DETECTED Final   Coronavirus 229E NOT DETECTED NOT DETECTED Final    Comment: (NOTE) The Coronavirus on the Respiratory Panel, DOES NOT test for the novel  Coronavirus (2019 nCoV)    Coronavirus HKU1 NOT DETECTED NOT DETECTED Final   Coronavirus NL63 NOT DETECTED NOT DETECTED Final   Coronavirus OC43 NOT DETECTED NOT DETECTED Final   Metapneumovirus NOT DETECTED NOT DETECTED Final   Rhinovirus / Enterovirus NOT DETECTED NOT DETECTED Final   Influenza A NOT DETECTED NOT DETECTED Final   Influenza B NOT DETECTED NOT DETECTED Final   Parainfluenza Virus 1 NOT DETECTED NOT DETECTED Final   Parainfluenza Virus 2 NOT DETECTED  NOT DETECTED Final   Parainfluenza Virus 3 NOT DETECTED NOT DETECTED Final   Parainfluenza Virus 4 NOT DETECTED NOT DETECTED Final   Respiratory Syncytial Virus NOT DETECTED NOT DETECTED Final   Bordetella pertussis NOT DETECTED NOT DETECTED Final   Bordetella Parapertussis NOT DETECTED NOT DETECTED Final   Chlamydophila pneumoniae NOT DETECTED NOT DETECTED Final   Mycoplasma pneumoniae NOT DETECTED NOT DETECTED Final    Comment: Performed at [facility], [place] [address] [place], [place] [address]  Culture, blood (routine x 2)     Status: None   Collection Time: 05/02/20  2:15 PM   Specimen: BLOOD  Result Value Ref Range Status   Specimen Description BLOOD LEFT ANTECUBITAL  Final   Special Requests   Final    BOTTLES DRAWN AEROBIC AND ANAEROBIC Blood Culture results may not be optimal due to an inadequate volume of blood received in culture bottles   Culture   Final    NO GROWTH 5 DAYS Performed at [facility], [place] [address], [place],  [address]    Report Status 05/07/2020 FINAL  Final  Culture, blood (routine x 2)     Status: None   Collection Time: 05/02/20  2:31 PM   Specimen: BLOOD LEFT HAND  Result Value Ref Range Status   Specimen Description BLOOD LEFT HAND  Final  Special Requests   Final    BOTTLES DRAWN AEROBIC ONLY Blood Culture adequate volume   Culture   Final    NO GROWTH 5 DAYS Performed at [facility], [place] [address], [place], [place] [address]    Report Status 05/07/2020 FINAL  Final         Radiology Studies: No results found.    Scheduled Meds: . sodium chloride   Intravenous Once  . sodium chloride   Intravenous Once  . aspirin EC  81 mg Oral Daily  . diphenhydrAMINE  25 mg Intravenous Once  . docusate sodium  100 mg Oral BID  . famotidine  40 mg Oral Daily  . furosemide  40 mg Intravenous BID  . insulin aspart  0-9 Units Subcutaneous TID WC  . linaclotide  145 mcg Oral QAC breakfast  . methylPREDNISolone  (SOLU-MEDROL) injection  125 mg Intravenous Daily  . metoprolol succinate  75 mg Oral Daily  . montelukast  10 mg Oral QHS  . pantoprazole  40 mg Oral Daily  . potassium & sodium phosphates  1 packet Oral TID WC & HS  . pravastatin  40 mg Oral QHS  . Ensure Max Protein  11 oz Oral TID  . senna-docusate  1 tablet Oral Q0600  . sertraline  100 mg Oral Daily   Continuous Infusions: . ceFEPime (MAXIPIME) IV 2 g (05/09/20 0535)  . sodium chloride       LOS: 10 days      Debbe Odea, MD [facility] Pager: [email or web address] 05/09/2020, 11:54 AM

## 2020-05-10 ENCOUNTER — Inpatient Hospital Stay (HOSPITAL_COMMUNITY): Payer: 59

## 2020-05-10 ENCOUNTER — Ambulatory Visit (INDEPENDENT_AMBULATORY_CARE_PROVIDER_SITE_OTHER): Payer: Medicare Other | Admitting: Primary Care

## 2020-05-10 LAB — CBC
HCT: 26.6 % — ABNORMAL LOW (ref 36.0–46.0)
Hemoglobin: 8.4 g/dL — ABNORMAL LOW (ref 12.0–15.0)
MCH: 30.1 pg (ref 26.0–34.0)
MCHC: 31.6 g/dL (ref 30.0–36.0)
MCV: 95.3 fL (ref 80.0–100.0)
Platelets: 55 K/uL — ABNORMAL LOW (ref 150–400)
RBC: 2.79 MIL/uL — ABNORMAL LOW (ref 3.87–5.11)
RDW: 21.2 % — ABNORMAL HIGH (ref 11.5–15.5)
WBC: 13 K/uL — ABNORMAL HIGH (ref 4.0–10.5)
nRBC: 6 % — ABNORMAL HIGH (ref 0.0–0.2)

## 2020-05-10 LAB — BASIC METABOLIC PANEL
Anion gap: 10 (ref 5–15)
BUN: 18 mg/dL (ref 8–23)
CO2: 25 mmol/L (ref 22–32)
Calcium: 9.6 mg/dL (ref 8.9–10.3)
Chloride: 100 mmol/L (ref 98–111)
Creatinine, Ser: 0.7 mg/dL (ref 0.44–1.00)
GFR, Estimated: >60 mL/min (ref >60)
Glucose, Bld: 296 mg/dL — ABNORMAL HIGH (ref 70–99)
Potassium: 4.7 mmol/L (ref 3.5–5.1)
Sodium: 135 mmol/L (ref 135–145)

## 2020-05-10 LAB — GLUCOSE, CAPILLARY
Glucose-Capillary: 235 mg/dL — ABNORMAL HIGH (ref 70–99)
Glucose-Capillary: 272 mg/dL — ABNORMAL HIGH (ref 70–99)
Glucose-Capillary: 131 mg/dL — ABNORMAL HIGH (ref 70–99)
Glucose-Capillary: 224 mg/dL — ABNORMAL HIGH (ref 70–99)

## 2020-05-10 MED ORDER — INSULIN GLARGINE 100 UNIT/ML ~~LOC~~ SOLN: 8.0000 [IU] | Freq: Every day | SUBCUTANEOUS | Status: DC

## 2020-05-10 MED ORDER — METHYLPREDNISOLONE SODIUM SUCC 1000 MG IJ SOLR
1000.0000 mg | INTRAMUSCULAR | Status: DC
  Administered 2020-05-10: 1000 mg via INTRAVENOUS
  Filled 2020-05-10 (×2): qty 8

## 2020-05-10 MED ORDER — INSULIN GLARGINE 100 UNIT/ML ~~LOC~~ SOLN
8.0000 [IU] | Freq: Every day | SUBCUTANEOUS | Status: AC
  Administered 2020-05-10 – 2020-05-11 (×2): 8 [IU] via SUBCUTANEOUS
  Filled 2020-05-10 (×2): qty 0.08

## 2020-05-10 NOTE — Plan of Care (Signed)
  Problem: Education: Goal: Knowledge of General Education information will improve Description: Including pain rating scale, medication(s)/side effects and non-pharmacologic comfort measures Outcome: Progressing   Problem: Health Behavior/Discharge Planning: Goal: Ability to manage health-related needs will improve Outcome: Progressing   Problem: Clinical Measurements: Goal: Ability to maintain clinical measurements within normal limits will improve Outcome: Progressing Goal: Will remain free from infection Outcome: Progressing Goal: Diagnostic test results will improve Outcome: Progressing Goal: Respiratory complications will improve Outcome: Progressing Goal: Cardiovascular complication will be avoided Outcome: Progressing   Problem: Activity: Goal: Risk for activity intolerance will decrease Outcome: Progressing   Problem: Nutrition: Goal: Adequate nutrition will be maintained Outcome: Progressing   Problem: Coping: Goal: Level of anxiety will decrease Outcome: Progressing   Problem: Elimination: Goal: Will not experience complications related to bowel motility Outcome: Progressing   Problem: Pain Managment: Goal: General experience of comfort will improve Outcome: Progressing   Problem: Safety: Goal: Ability to remain free from injury will improve Outcome: Progressing   Problem: Skin Integrity: Goal: Risk for impaired skin integrity will decrease Outcome: Progressing   Problem: Education: Goal: Verbalization of understanding the information provided (i.e., activity precautions, restrictions, etc) will improve Outcome: Progressing Goal: Individualized Educational Video(s) Outcome: Progressing   Problem: Activity: Goal: Ability to ambulate and perform ADLs will improve Outcome: Progressing   Problem: Clinical Measurements: Goal: Postoperative complications will be avoided or minimized Outcome: Progressing   Problem: Self-Concept: Goal: Ability to  maintain and perform role responsibilities to the fullest extent possible will improve Outcome: Progressing   Problem: Pain Management: Goal: Pain level will decrease Outcome: Progressing   

## 2020-05-10 NOTE — Care Management Important Message (Signed)
Important Message  Patient Details  Name: Kathy Irwin MRN: [id number] Date of Birth: August 09, 1950   Medicare Important Message Given:  Yes     Shelda Altes 05/10/2020, 3:50 PM

## 2020-05-10 NOTE — Progress Notes (Signed)
Physical Therapy Treatment Patient Details Name: Kathy Irwin MRN: [id number] DOB: 07-Feb-1951 Today's Date: 05/10/2020    History of Present Illness 70 yo female with mechanical fall was admitted for IM nailing of her R intertrochanteric fracture. 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    PT Comments    Pt continues to make steady progress. Continue to recommend ST-SNF.    Follow Up Recommendations  SNF     Equipment Recommendations  None recommended by PT    Recommendations for Other Services       Precautions / Restrictions Precautions Precautions: Fall Restrictions Weight Bearing Restrictions: Yes RLE Weight Bearing: Weight bearing as tolerated    Mobility  Bed Mobility Overal bed mobility: Needs Assistance Bed Mobility: Supine to Sit     Supine to sit: +2 for physical assistance;Min assist     General bed mobility comments: Assist to bring RLE off of bed and elevate trunk into sitting  Transfers Overall transfer level: Needs assistance Equipment used: Rolling walker (2 wheeled) Transfers: Sit to/from [facility] Sit to Stand: Min assist;+2 safety/equipment Stand pivot transfers: Min assist;+2 safety/equipment       General transfer comment: Assist to bring hips up. Verbal cues for hand placement  Ambulation/Gait Ambulation/Gait assistance: Min assist;+2 safety/equipment Gait Distance (Feet): 12 Feet Assistive device: Rolling walker (2 wheeled) Gait Pattern/deviations: Step-to pattern;Antalgic;Decreased stride length Gait velocity: decr Gait velocity interpretation: <1.31 ft/sec, indicative of household ambulator General Gait Details: assist for balance and support   Stairs             Wheelchair Mobility    Modified Rankin (Stroke Patients Only)       Balance Overall balance assessment: Needs assistance Sitting-balance support: No upper extremity supported;Feet supported Sitting  balance-Leahy Scale: Fair     Standing balance support: Bilateral upper extremity supported Standing balance-Leahy Scale: Poor Standing balance comment: walker and min assis for static standing                            Cognition Arousal/Alertness: Awake/alert Behavior During Therapy: WFL for tasks assessed/performed Overall Cognitive Status: Impaired/Different from baseline Area of Impairment: Problem solving;Memory                     Memory: Decreased short-term memory     Awareness: Emergent Problem Solving: Requires verbal cues        Exercises      General Comments General comments (skin integrity, edema, etc.): SpO2 84% on RA with mobility. Replaced O2      Pertinent Vitals/Pain Pain Assessment: Faces Faces Pain Scale: Hurts even more Pain Location: rt hip Pain Descriptors / Indicators: Operative site guarding;Grimacing Pain Intervention(s): Monitored during session;Premedicated before session;Repositioned    Home Living                      Prior Function            PT Goals (current goals can now be found in the care plan section) Progress towards PT goals: Progressing toward goals    Frequency    Min 3X/week      PT Plan Current plan remains appropriate    Co-evaluation              AM-PAC PT "6 Clicks" Mobility   Outcome Measure  Help needed turning from your back to your side while in a flat  bed without using bedrails?: A Little Help needed moving from lying on your back to sitting on the side of a flat bed without using bedrails?: A Little Help needed moving to and from a bed to a chair (including a wheelchair)?: A Little Help needed standing up from a chair using your arms (e.g., wheelchair or bedside chair)?: A Little Help needed to walk in hospital room?: A Little Help needed climbing 3-5 steps with a railing? : Total 6 Click Score: 16    End of Session Equipment Utilized During Treatment:  Oxygen;Gait belt Activity Tolerance: Patient tolerated treatment well Patient left: in chair;with call bell/phone within reach;with chair alarm set Nurse Communication: Mobility status PT Visit Diagnosis: Muscle weakness (generalized) (M62.81);Pain;History of falling (Z91.81) Pain - Right/Left: Right Pain - part of body: Hip     Time: 1355-1425 PT Time Calculation (min) (ACUTE ONLY): 30 min  Charges:  $Gait Training: 23-37 mins                     [place] Pager [phone] Office [place] 05/10/2020, 3:12 PM

## 2020-05-10 NOTE — Progress Notes (Signed)
PROGRESS NOTE    Kathy Irwin   YSH:[id number]  DOB: 05/27/1950  DOA: 04/29/2020 PCP: Kerin Perna, NP   Brief Narrative:  Kathy Irwin is a 70 y.o.femalewith medical history significant ofHTN, interstitial lung disease (hypersensitivity pneumonitis),chronic respiratory failure with hypoxia on 2 L at night,DM type II, ITP,CMML-1,and obesity presents after having a mechanicl fall while at work and right hip pain.   In ED > subtrochanteric fracture of right femur RR in 30s Platelets in 30s.   1/14 underwent IM nail/ percutaneous fixation of femoral neck fractuire 1/15 Hb 6.9 from 8.9 1/16- increasingly lethargic after IV Morphine- code stroke called and notes state she was obtunded- CTA chest revealed multifocal infiltrates consistent with pneumonia, more hypoxic- vitals> 79/36, temp 100.2, HR 151, RR in 30s,  Narcan given with some improvement in lethargy but ongoing confusion-  MRI ordered but patient unable to cooperate  1/17, pulse ox 85% on room air, HR in 130s- pulm consulted- PT recommends SNF, FL2 done 1/18- MRI attempted- pt did not allow moving to exam table   Subjective: No new complaints.    Assessment & Plan:   Principal Problem:   Closed intertrochanteric fracture of hip, right, initial encounter  - repaired on 1/14- PT recommended SNF  Active Problems:   Acute on chronic respiratory failure with hypoxemia (HCC)    History of Hypersensitivity pneumonitis     Multifocal pneumonia    Acute on chronic diastolic CHF - aspiration vs CAP -  antibiotics started on 1/17>  Vancomycin and Cefepime - d/c Vancomycin on 1/19- MRSA PCR negative on 1/13 - wean O2 as able- she typically uses 3 L At night at home - started lasix for pulmonary edema noted on CXR and on exam  - follow electrolytes and Cr and wean O2 as able - dc Cefepime and IV Lasix today    Acute metabolic encephalopathy - noted to occur on 1/16 and I suspect it was related to acute  infection (pneumonia) with narcotics and hypoxia complicating things - per daughter, the patient was alert and oriented on 1/18- it seems that she became agitated and confused after receiving Ativan to attempt an MRI- will avoid Benzodizepines - this AM she is very sleepy although she last received sedatives (Ativan) yesaterday for the MRI - d/c MRI as at this point I do not think she had a CVA - Using only Tramadol PRN for pain which, per report from daughter does not adversely affect her - awake and alert now    CMML 1 and thrombocytopenia - no treatment indicated fro CMML per oncology note on 1/18 - given 3 U total of pheresed platelets thus far- platelets are in 70,000 range- use steroids for ITP if platelets < 50K - plt have dropped to 46 on 1/22 a are 48 today- Cont IV Solumedrol 1 gm daily x 2 more days- Platelets improved after the first dose yesterday   Anemia - baseline Hb ~ 11 - likely acute blood loss due to fracture in setting of AOCD - has received a total of 4 u PRBC so far - Hb remaining steady at 7-8 range    DM2    Component Value Date/Time   HGBA1C 6.7 (H) 04/30/2020 0334  - cont SSI- holding Amaryl and Metformin     Time spent in minutes: 45 DVT prophylaxis: SCDs Start: 04/30/20 1811 Code Status: Full code Family Communication: daughter at bedside Disposition Plan:  Status is: Inpatient  Remains inpatient appropriate because:IV treatments appropriate  due to intensity of illness or inability to take PO   Dispo: The patient is from: Home              Anticipated d/c is to: SNF              Anticipated d/c date is: > 3 days              Patient currently is not medically stable to d/c.  Consultants:   Orthopedic surgery  PCCM Procedures:   IM nair of right hip Antimicrobials:  Anti-infectives (From admission, onward)   Start     Dose/Rate Route Frequency Ordered Stop   05/03/20 1400  vancomycin (VANCOREADY) IVPB 1750 mg/350 mL  Status:   Discontinued        1,750 mg 175 mL/hr over 120 Minutes Intravenous Every 24 hours 05/02/20 1317 05/05/20 1347   05/02/20 1400  vancomycin (VANCOCIN) 2,500 mg in sodium chloride 0.9 % 500 mL IVPB        2,500 mg 250 mL/hr over 120 Minutes Intravenous  Once 05/02/20 1305 05/02/20 1850   05/02/20 1315  ceFEPIme (MAXIPIME) 2 g in sodium chloride 0.9 % 100 mL IVPB        2 g 200 mL/hr over 30 Minutes Intravenous Every 8 hours 05/02/20 1305     04/30/20 2200  ceFAZolin (ANCEF) IVPB 2g/100 mL premix        2 g 200 mL/hr over 30 Minutes Intravenous Every 8 hours 04/30/20 1810 05/01/20 1443   04/30/20 1438  vancomycin (VANCOCIN) powder  Status:  Discontinued          As needed 04/30/20 1438 04/30/20 1603   04/30/20 0600  ceFAZolin (ANCEF) IVPB 2g/100 mL premix        2 g 200 mL/hr over 30 Minutes Intravenous On call to O.R. 04/29/20 2128 04/30/20 1257       Objective: Vitals:   05/10/20 0533 05/10/20 0756 05/10/20 1139 05/10/20 1553  BP: (!) 150/78 (!) 158/77 (!) 142/74   Pulse: 84 78 75   Resp: (!) 22 20 (!) 23 19  Temp: 98.3 F (36.8 C) 98.1 F (36.7 C) 98 F (36.7 C) (!) 97.4 F (36.3 C)  TempSrc: Oral Oral Oral Oral  SpO2: 96% 98% 96%   Weight:      Height:        Intake/Output Summary (Last 24 hours) at 05/10/2020 1602 Last data filed at 05/10/2020 1348 Gross per 24 hour  Intake 600 ml  Output 2801 ml  Net -2201 ml   Filed Weights   05/08/20 0602 05/10/20 0039 05/10/20 0424  Weight: 117.7 kg 114.3 kg 114.3 kg    Examination: General exam: Appears comfortable  HEENT: PERRLA, oral mucosa moist, no sclera icterus or thrush Respiratory system: Clear to auscultation. Respiratory effort normal. Cardiovascular system: S1 & S2 heard, regular rate and rhythm Gastrointestinal system: Abdomen soft, non-tender, nondistended. Normal bowel sounds   Central nervous system: Alert and oriented. No focal neurological deficits. Extremities: No cyanosis, clubbing - has edema of left  thigh Skin: No rashes or ulcers Psychiatry:  Mood & affect appropriate.   Data Reviewed: I have personally reviewed following labs and imaging studies  CBC: Recent Labs  Lab 05/04/20 0639 05/05/20 0257 05/06/20 0221 05/07/20 0211 05/08/20 0230 05/09/20 0239 05/10/20 0117  WBC 17.6* 17.2* 16.6* 14.2* 14.5* 15.6* 13.0*  NEUTROABS 10.3* 9.0*  --   --   --   --   --   HGB  7.5* 7.4* 7.3* 7.3* 7.7* 8.4* 8.4*  HCT 23.6* 23.8* 23.9* 22.7* 25.1* 26.2* 26.6*  MCV 94.0 94.4 94.8 94.6 96.5 94.9 95.3  PLT 77* 76* 67* 59* 46* 48* 55*   Basic Metabolic Panel: Recent Labs  Lab 05/04/20 0639 05/05/20 0257 05/06/20 0221 05/07/20 0211 05/08/20 0230 05/09/20 0239 05/10/20 0117  NA 137 137 137 136 135 135 135  K 4.2 4.2 4.2 4.0 4.2 4.7 4.7  CL 107 109 107 104 102 100 100  CO2 _0 GLUCOSE 168* 152* 139* 174* 133* 193* 296*  BUN _1 CREATININE 0.52 0.58 0.50 0.58 0.49 0.60 0.70  CALCIUM 8.5* 8.5* 8.4* 8.7* 8.8* 9.3 9.6  MG 1.7 1.6*  --   --   --   --   --   PHOS 2.4* 2.1*  --   --   --   --   --    GFR: Estimated Creatinine Clearance: 80.9 mL/min (by C-G formula based on SCr of 0.7 mg/dL). Liver Function Tests: Recent Labs  Lab 05/04/20 0639 05/05/20 0257  AST 37 43*  ALT 15 24  ALKPHOS 46 51  BILITOT 1.1 1.3*  PROT 5.4* 5.4*  ALBUMIN 2.2* 2.1*   No results for input(s): LIPASE, AMYLASE in the last 168 hours. No results for input(s): AMMONIA in the last 168 hours. Coagulation Profile: No results for input(s): INR, PROTIME in the last 168 hours. Cardiac Enzymes: No results for input(s): CKTOTAL, CKMB, CKMBINDEX, TROPONINI in the last 168 hours. BNP (last 3 results) No results for input(s): PROBNP in the last 8760 hours. HbA1C: No results for input(s): HGBA1C in the last 72 hours. CBG: Recent Labs  Lab 05/09/20 1255 05/09/20 1637 05/09/20 2148 05/10/20 0755 05/10/20 1137  GLUCAP 224* 236* 293* 235* 272*   Lipid Profile: No  results for input(s): CHOL, HDL, LDLCALC, TRIG, CHOLHDL, LDLDIRECT in the last 72 hours. Thyroid Function Tests: No results for input(s): TSH, T4TOTAL, FREET4, T3FREE, THYROIDAB in the last 72 hours. Anemia Panel: No results for input(s): VITAMINB12, FOLATE, FERRITIN, TIBC, IRON, RETICCTPCT in the last 72 hours. Urine analysis:    Component Value Date/Time   COLORURINE YELLOW 05/04/2020 0050   APPEARANCEUR CLEAR 05/04/2020 0050   LABSPEC 1.014 05/04/2020 0050   PHURINE 7.0 05/04/2020 0050   GLUCOSEU NEGATIVE 05/04/2020 0050   HGBUR NEGATIVE 05/04/2020 0050   BILIRUBINUR NEGATIVE 05/04/2020 0050   KETONESUR NEGATIVE 05/04/2020 0050   PROTEINUR NEGATIVE 05/04/2020 0050   UROBILINOGEN 1.0 08/14/2019 1824   NITRITE NEGATIVE 05/04/2020 0050   LEUKOCYTESUR NEGATIVE 05/04/2020 0050   Sepsis Labs: _2 (procalcitonin:4,lacticidven:4) ) Recent Results (from the past 240 hour(s))  Resp Panel by RT-PCR (Flu A&B, Covid) Nasopharyngeal Swab     Status: None   Collection Time: 05/02/20  1:01 PM   Specimen: Nasopharyngeal Swab; Nasopharyngeal(NP) swabs in vial transport medium  Result Value Ref Range Status   SARS Coronavirus 2 by RT PCR NEGATIVE NEGATIVE Final    Comment: (NOTE) SARS-CoV-2 target nucleic acids are NOT DETECTED.  The SARS-CoV-2 RNA is generally detectable in upper respiratory specimens during the acute phase of infection. The lowest concentration of SARS-CoV-2 viral copies this assay can detect is 138 copies/mL. A negative result does not preclude SARS-Cov-2 infection and should not be used as the sole basis for treatment or other patient management decisions. A negative result may occur with  improper specimen collection/handling, submission of specimen other than nasopharyngeal swab,  presence of viral mutation(s) within the areas targeted by this assay, and inadequate number of viral copies(<138 copies/mL). A negative result must be combined with clinical  observations, patient history, and epidemiological information. The expected result is Negative.  Fact Sheet for Patients:  EntrepreneurPulse.com.au  Fact Sheet for Healthcare Providers:  IncredibleEmployment.be  This test is no t yet approved or cleared by the [place] FDA and  has been authorized for detection and/or diagnosis of SARS-CoV-2 by FDA under an Emergency Use Authorization (EUA). This EUA will remain  in effect (meaning this test can be used) for the duration of the COVID-19 declaration under Section 564(b)(1) of the Act, 21 U.S.C.section 360bbb-3(b)(1), unless the authorization is terminated  or revoked sooner.       Influenza A by PCR NEGATIVE NEGATIVE Final   Influenza B by PCR NEGATIVE NEGATIVE Final    Comment: (NOTE) The Xpert Xpress SARS-CoV-2/FLU/RSV plus assay is intended as an aid in the diagnosis of influenza from Nasopharyngeal swab specimens and should not be used as a sole basis for treatment. Nasal washings and aspirates are unacceptable for Xpert Xpress SARS-CoV-2/FLU/RSV testing.  Fact Sheet for Patients: EntrepreneurPulse.com.au  Fact Sheet for Healthcare Providers: IncredibleEmployment.be  This test is not yet approved or cleared by the [place] FDA and has been authorized for detection and/or diagnosis of SARS-CoV-2 by FDA under an Emergency Use Authorization (EUA). This EUA will remain in effect (meaning this test can be used) for the duration of the COVID-19 declaration under Section 564(b)(1) of the Act, 21 U.S.C. section 360bbb-3(b)(1), unless the authorization is terminated or revoked.  Performed at [facility], [place] [address] [place], [place] [address]   Respiratory (~20 pathogens) panel by PCR     Status: None   Collection Time: 05/02/20  1:01 PM   Specimen: Nasopharyngeal Swab; Respiratory  Result Value Ref Range Status   Adenovirus NOT  DETECTED NOT DETECTED Final   Coronavirus 229E NOT DETECTED NOT DETECTED Final    Comment: (NOTE) The Coronavirus on the Respiratory Panel, DOES NOT test for the novel  Coronavirus (2019 nCoV)    Coronavirus HKU1 NOT DETECTED NOT DETECTED Final   Coronavirus NL63 NOT DETECTED NOT DETECTED Final   Coronavirus OC43 NOT DETECTED NOT DETECTED Final   Metapneumovirus NOT DETECTED NOT DETECTED Final   Rhinovirus / Enterovirus NOT DETECTED NOT DETECTED Final   Influenza A NOT DETECTED NOT DETECTED Final   Influenza B NOT DETECTED NOT DETECTED Final   Parainfluenza Virus 1 NOT DETECTED NOT DETECTED Final   Parainfluenza Virus 2 NOT DETECTED NOT DETECTED Final   Parainfluenza Virus 3 NOT DETECTED NOT DETECTED Final   Parainfluenza Virus 4 NOT DETECTED NOT DETECTED Final   Respiratory Syncytial Virus NOT DETECTED NOT DETECTED Final   Bordetella pertussis NOT DETECTED NOT DETECTED Final   Bordetella Parapertussis NOT DETECTED NOT DETECTED Final   Chlamydophila pneumoniae NOT DETECTED NOT DETECTED Final   Mycoplasma pneumoniae NOT DETECTED NOT DETECTED Final    Comment: Performed at [facility], [place]. [address] [place], [place] [address]  Culture, blood (routine x 2)     Status: None   Collection Time: 05/02/20  2:15 PM   Specimen: BLOOD  Result Value Ref Range Status   Specimen Description BLOOD LEFT ANTECUBITAL  Final   Special Requests   Final    BOTTLES DRAWN AEROBIC AND ANAEROBIC Blood Culture results may not be optimal due to an inadequate volume of blood received in culture  bottles   Culture   Final    NO GROWTH 5 DAYS Performed at [facility], [place] [address] [place], [place] [address]    Report Status 05/07/2020 FINAL  Final  Culture, blood (routine x 2)     Status: None   Collection Time: 05/02/20  2:31 PM   Specimen: BLOOD LEFT HAND  Result Value Ref Range Status   Specimen Description BLOOD LEFT HAND  Final   Special Requests   Final    BOTTLES DRAWN AEROBIC  ONLY Blood Culture adequate volume   Culture   Final    NO GROWTH 5 DAYS Performed at [facility], [place] [address] [place], [place] [address]    Report Status 05/07/2020 FINAL  Final         Radiology Studies: DG Chest Port 1 View  Result Date: 05/10/2020 CLINICAL DATA:  Shortness of breath Pneumonia Hypoxia EXAM: PORTABLE CHEST 1 VIEW COMPARISON:  05/05/2020 FINDINGS: Heart size is within normal limits. No significant pulmonary vascular congestion. Interval improvement in bilateral airspace opacities with mild interstitial opacities still remaining in the mid and lower lungs. IMPRESSION: Interval improvement in aeration of the lungs, most likely due to decreasing pulmonary edema or pneumonia. Electronically Signed   By: Miachel Roux M.D.   On: 05/10/2020 07:46      Scheduled Meds: . sodium chloride   Intravenous Once  . sodium chloride   Intravenous Once  . aspirin EC  81 mg Oral Daily  . diphenhydrAMINE  25 mg Intravenous Once  . docusate sodium  100 mg Oral BID  . famotidine  40 mg Oral Daily  . furosemide  40 mg Intravenous BID  . insulin aspart  0-9 Units Subcutaneous TID WC  . insulin glargine  8 Units Subcutaneous Daily  . linaclotide  145 mcg Oral QAC breakfast  . metoprolol succinate  75 mg Oral Daily  . montelukast  10 mg Oral QHS  . pantoprazole  40 mg Oral Daily  . potassium & sodium phosphates  1 packet Oral TID WC & HS  . pravastatin  40 mg Oral QHS  . Ensure Max Protein  11 oz Oral TID  . senna-docusate  1 tablet Oral Q0600  . sertraline  100 mg Oral Daily   Continuous Infusions: . ceFEPime (MAXIPIME) IV 2 g (05/10/20 1331)  . sodium chloride       LOS: 11 days      Debbe Odea, MD [facility] Pager: [email or web address] 05/10/2020, 4:02 PM

## 2020-05-11 DIAGNOSIS — J9611 Chronic respiratory failure with hypoxia: Secondary | ICD-10-CM

## 2020-05-11 DIAGNOSIS — E119 Type 2 diabetes mellitus without complications: Secondary | ICD-10-CM

## 2020-05-11 DIAGNOSIS — J679 Hypersensitivity pneumonitis due to unspecified organic dust: Secondary | ICD-10-CM

## 2020-05-11 DIAGNOSIS — Z794 Long term (current) use of insulin: Secondary | ICD-10-CM

## 2020-05-11 LAB — CBC
HCT: 26 % — ABNORMAL LOW (ref 36.0–46.0)
Hemoglobin: 8.1 g/dL — ABNORMAL LOW (ref 12.0–15.0)
MCH: 29.9 pg (ref 26.0–34.0)
MCHC: 31.2 g/dL (ref 30.0–36.0)
MCV: 95.9 fL (ref 80.0–100.0)
Platelets: 61 10*3/uL — ABNORMAL LOW (ref 150–400)
RBC: 2.71 MIL/uL — ABNORMAL LOW (ref 3.87–5.11)
RDW: 21.6 % — ABNORMAL HIGH (ref 11.5–15.5)
WBC: 12.7 10*3/uL — ABNORMAL HIGH (ref 4.0–10.5)
nRBC: 4.2 % — ABNORMAL HIGH (ref 0.0–0.2)

## 2020-05-11 LAB — SARS CORONAVIRUS 2 (TAT 6-24 HRS): SARS Coronavirus 2: NEGATIVE

## 2020-05-11 LAB — GLUCOSE, CAPILLARY
Glucose-Capillary: 221 mg/dL — ABNORMAL HIGH (ref 70–99)
Glucose-Capillary: 145 mg/dL — ABNORMAL HIGH (ref 70–99)
Glucose-Capillary: 136 mg/dL — ABNORMAL HIGH (ref 70–99)
Glucose-Capillary: 160 mg/dL — ABNORMAL HIGH (ref 70–99)

## 2020-05-11 NOTE — Plan of Care (Signed)
Problem: Education: Goal: Knowledge of General Education information will improve Description: Including pain rating scale, medication(s)/side effects and non-pharmacologic comfort measures 05/11/2020 1427 by Camillia Herter, RN Outcome: Adequate for Discharge 05/11/2020 0820 by Camillia Herter, RN Outcome: Progressing   Problem: Health Behavior/Discharge Planning: Goal: Ability to manage health-related needs will improve 05/11/2020 1427 by Camillia Herter, RN Outcome: Adequate for Discharge 05/11/2020 0820 by Camillia Herter, RN Outcome: Progressing   Problem: Clinical Measurements: Goal: Ability to maintain clinical measurements within normal limits will improve 05/11/2020 1427 by Camillia Herter, RN Outcome: Adequate for Discharge 05/11/2020 0820 by Camillia Herter, RN Outcome: Progressing Goal: Will remain free from infection 05/11/2020 1427 by Camillia Herter, RN Outcome: Adequate for Discharge 05/11/2020 0820 by Camillia Herter, RN Outcome: Progressing Goal: Diagnostic test results will improve 05/11/2020 1427 by Camillia Herter, RN Outcome: Adequate for Discharge 05/11/2020 0820 by Camillia Herter, RN Outcome: Progressing Goal: Respiratory complications will improve 05/11/2020 1427 by Camillia Herter, RN Outcome: Adequate for Discharge 05/11/2020 0820 by Camillia Herter, RN Outcome: Progressing Goal: Cardiovascular complication will be avoided 05/11/2020 1427 by Camillia Herter, RN Outcome: Adequate for Discharge 05/11/2020 0820 by Camillia Herter, RN Outcome: Progressing   Problem: Activity: Goal: Risk for activity intolerance will decrease 05/11/2020 1427 by Camillia Herter, RN Outcome: Adequate for Discharge 05/11/2020 0820 by Camillia Herter, RN Outcome: Progressing   Problem: Nutrition: Goal: Adequate nutrition will be maintained 05/11/2020 1427 by Camillia Herter, RN Outcome: Adequate for Discharge 05/11/2020 0820 by Camillia Herter, RN Outcome: Progressing   Problem:  Coping: Goal: Level of anxiety will decrease 05/11/2020 1427 by Camillia Herter, RN Outcome: Adequate for Discharge 05/11/2020 0820 by Camillia Herter, RN Outcome: Progressing   Problem: Elimination: Goal: Will not experience complications related to bowel motility 05/11/2020 1427 by Camillia Herter, RN Outcome: Adequate for Discharge 05/11/2020 0820 by Camillia Herter, RN Outcome: Progressing   Problem: Pain Managment: Goal: General experience of comfort will improve 05/11/2020 1427 by Camillia Herter, RN Outcome: Adequate for Discharge 05/11/2020 0820 by Camillia Herter, RN Outcome: Progressing   Problem: Safety: Goal: Ability to remain free from injury will improve 05/11/2020 1427 by Camillia Herter, RN Outcome: Adequate for Discharge 05/11/2020 0820 by Camillia Herter, RN Outcome: Progressing   Problem: Skin Integrity: Goal: Risk for impaired skin integrity will decrease 05/11/2020 1427 by Camillia Herter, RN Outcome: Adequate for Discharge 05/11/2020 0820 by Camillia Herter, RN Outcome: Progressing   Problem: Education: Goal: Verbalization of understanding the information provided (i.e., activity precautions, restrictions, etc) will improve 05/11/2020 1427 by Camillia Herter, RN Outcome: Adequate for Discharge 05/11/2020 0820 by Camillia Herter, RN Outcome: Progressing Goal: Individualized Educational Video(s) 05/11/2020 1427 by Camillia Herter, RN Outcome: Adequate for Discharge 05/11/2020 0820 by Camillia Herter, RN Outcome: Progressing   Problem: Activity: Goal: Ability to ambulate and perform ADLs will improve 05/11/2020 1427 by Camillia Herter, RN Outcome: Adequate for Discharge 05/11/2020 0820 by Camillia Herter, RN Outcome: Progressing   Problem: Clinical Measurements: Goal: Postoperative complications will be avoided or minimized 05/11/2020 1427 by Camillia Herter, RN Outcome: Adequate for Discharge 05/11/2020 0820 by Camillia Herter, RN Outcome: Progressing   Problem:  Self-Concept: Goal: Ability to maintain and perform role responsibilities to the fullest extent possible will improve 05/11/2020 1427 by Camillia Herter, RN Outcome: Adequate for Discharge 05/11/2020 0820 by Burt Knack,  Bobette Mo, RN Outcome: Progressing   Problem: Pain Management: Goal: Pain level will decrease 05/11/2020 1427 by Camillia Herter, RN Outcome: Adequate for Discharge 05/11/2020 0820 by Camillia Herter, RN Outcome: Progressing

## 2020-05-11 NOTE — TOC Progression Note (Addendum)
Transition of Care [facility]) - Progression Note    Patient Details  Name: Kathy Irwin MRN: [id number] Date of Birth: 07/11/1950  Transition of Care [facility]) CM/SW [place], [place] Phone Number: 05/11/2020, 11:08 AM  Clinical Narrative:     Update 1/25 1:20pm-CSW received insurance authorization for patient. [place] ID Reference number is #[id number]. Insurance has been approved from 1/25-1/27. Next review date is 1/27.  Patient has SNF bed at [facility]. Insurance authorization has been approved.  CSW will continue to follow.  CSW started insurance authorization for patient. Reference number is #[id number]. Clinicals were faxed over to patients insurance for review. Requested start date is for today 1/25.  Patient has SNF bed at [facility].  CSW will continue to follow.   Expected Discharge Plan: Skilled Nursing Facility Barriers to Discharge: Continued Medical Work up  Expected Discharge Plan and Services Expected Discharge Plan: [place] arrangements for the past 2 months: Single Family Home                                       Social Determinants of Health (SDOH) Interventions    Readmission Risk Interventions Readmission Risk Prevention Plan 08/12/2019  Transportation Screening Complete  PCP or Specialist Appt within 3-5 Days Complete  HRI or [place] Complete  Social Work Consult for [place] Planning/Counseling Complete  Palliative Care Screening Not Applicable  Medication Review Press photographer) Complete  Some recent data might be hidden

## 2020-05-11 NOTE — TOC Transition Note (Signed)
Transition of Care [facility]) - CM/SW Discharge Note   Patient Details  Name: Kathy Irwin MRN: [id number] Date of Birth: 13-Nov-1950  Transition of Care [facility]) CM/SW Contact:  Trula Ore, [place] Phone Number: 05/11/2020, 1:49 PM   Clinical Narrative:     Patient will DC to: [place]  Anticipated DC date: 05/11/2020  Family notified: Tonya  Transport by: Corey Harold  ?  Per MD patient ready for DC to [facility] . RN, patient, patient's family, and facility notified of DC. Discharge Summary sent to facility. RN given number for report tele#[phone] RM#[id number]. DC packet on chart. Ambulance transport requested for patient.  CSW signing off.    Final next level of care: Skilled Nursing Facility Barriers to Discharge: No Barriers Identified   Patient Goals and CMS Choice   CMS Medicare.gov Compare Post Acute Care list provided to:: Patient Represenative (must comment) (daughter [name]) Choice offered to / list presented to : Adult Children [name])  Discharge Placement              Patient chooses bed at: [facility] Patient to be transferred to facility by: [place] Name of family member notified: Tonya Patient and family notified of of transfer: 05/11/20  Discharge Plan and Services                                     Social Determinants of Health (SDOH) Interventions     Readmission Risk Interventions Readmission Risk Prevention Plan 08/12/2019  Transportation Screening Complete  PCP or Specialist Appt within 3-5 Days Complete  HRI or [place] Complete  Social Work Consult for [place] Planning/Counseling Complete  Palliative Care Screening Not Applicable  Medication Review Press photographer) Complete  Some recent data might be hidden

## 2020-05-11 NOTE — Plan of Care (Signed)
  Problem: Education: Goal: Knowledge of General Education information will improve Description: Including pain rating scale, medication(s)/side effects and non-pharmacologic comfort measures Outcome: Progressing   Problem: Health Behavior/Discharge Planning: Goal: Ability to manage health-related needs will improve Outcome: Progressing   Problem: Clinical Measurements: Goal: Ability to maintain clinical measurements within normal limits will improve Outcome: Progressing Goal: Will remain free from infection Outcome: Progressing Goal: Diagnostic test results will improve Outcome: Progressing Goal: Respiratory complications will improve Outcome: Progressing Goal: Cardiovascular complication will be avoided Outcome: Progressing   Problem: Activity: Goal: Risk for activity intolerance will decrease Outcome: Progressing   Problem: Nutrition: Goal: Adequate nutrition will be maintained Outcome: Progressing   Problem: Coping: Goal: Level of anxiety will decrease Outcome: Progressing   Problem: Elimination: Goal: Will not experience complications related to bowel motility Outcome: Progressing   Problem: Pain Managment: Goal: General experience of comfort will improve Outcome: Progressing   Problem: Safety: Goal: Ability to remain free from injury will improve Outcome: Progressing   Problem: Skin Integrity: Goal: Risk for impaired skin integrity will decrease Outcome: Progressing   Problem: Education: Goal: Verbalization of understanding the information provided (i.e., activity precautions, restrictions, etc) will improve Outcome: Progressing Goal: Individualized Educational Video(s) Outcome: Progressing   Problem: Activity: Goal: Ability to ambulate and perform ADLs will improve Outcome: Progressing   Problem: Clinical Measurements: Goal: Postoperative complications will be avoided or minimized Outcome: Progressing   Problem: Self-Concept: Goal: Ability to  maintain and perform role responsibilities to the fullest extent possible will improve Outcome: Progressing   Problem: Pain Management: Goal: Pain level will decrease Outcome: Progressing

## 2020-05-11 NOTE — Progress Notes (Signed)
Physical Therapy Treatment Patient Details Name: Kathy Irwin MRN: [id number] DOB: 1950-06-15 Today's Date: 05/11/2020    History of Present Illness 70 yo female with mechanical fall was admitted for IM nailing of her R intertrochanteric fracture. 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    PT Comments    Upon entry, pt distressed by not having a bath yet today. Deferred ambulation until she was clean so focused session on functional training. Pt required mod A to come to EOB and needs to assist to move RLE off bed. WOrked on using LLE to asisst to increase independence. Min A for sit>stand from bed, min-guard from recliner and BSC. Pt  Used BSC, max A for clean up in standing. Pt performed sit>stand from recliner 3 times as well as LE there ex for strengthening. Pt ambulated 5' with RW and min A. Much encouragement given throughout. PT will continue to follow.    Follow Up Recommendations  SNF     Equipment Recommendations  None recommended by PT    Recommendations for Other Services       Precautions / Restrictions Precautions Precautions: Fall Restrictions Weight Bearing Restrictions: Yes RLE Weight Bearing: Weight bearing as tolerated    Mobility  Bed Mobility Overal bed mobility: Needs Assistance Bed Mobility: Supine to Sit     Supine to sit: Mod assist     General bed mobility comments: pt needed assist to slide RLE off bed but is beginning to be able to assist it with the LLE. Mod HHA for pt to pull self to EOB and then pt able to scoot self out for feet on floor  Transfers Overall transfer level: Needs assistance Equipment used: Rolling walker (2 wheeled) Transfers: Sit to/from [employer] Transfers Sit to Stand: +2 safety/equipment;Min guard;Min assist Stand pivot transfers: Min assist       General transfer comment: first sit>stand pt required min A +2 and much encouragement. When she stood from [facility] she was able to stand with  min-guard A  Ambulation/Gait Ambulation/Gait assistance: Min assist;+2 safety/equipment Gait Distance (Feet): 5 Feet Assistive device: Rolling walker (2 wheeled) Gait Pattern/deviations: Step-to pattern;Antalgic;Decreased stride length Gait velocity: decr Gait velocity interpretation: <1.31 ft/sec, indicative of household ambulator General Gait Details: further distance deferred due to pt wanting to get washed up   Stairs             Wheelchair Mobility    Modified Rankin (Stroke Patients Only)       Balance Overall balance assessment: Needs assistance Sitting-balance support: No upper extremity supported;Feet supported Sitting balance-Leahy Scale: Good Sitting balance - Comments: maintained sitting EOB and able to scoot without LOB   Standing balance support: No upper extremity supported;During functional activity Standing balance-Leahy Scale: Fair Standing balance comment: able to maintain static stance without support to clean perineal area                            Cognition Arousal/Alertness: Awake/alert Behavior During Therapy: WFL for tasks assessed/performed Overall Cognitive Status: Within Functional Limits for tasks assessed                                 General Comments: pt appropriate this session      Exercises General Exercises - Lower Extremity Ankle Circles/Pumps: AROM;Both;Supine;10 reps Quad Sets: AROM;Both;10 reps;Supine Long Arc Quad: AROM;Right;10 reps;Seated Heel Slides:  AAROM;Right;Supine;10 reps (very limited range due to pain) Hip ABduction/ADduction: AAROM;Right;Supine;5 reps (very limited range due to pain)    General Comments General comments (skin integrity, edema, etc.): VSS on 2 L O2      Pertinent Vitals/Pain Pain Assessment: 0-10 Pain Score: 6  Pain Location: rt hip and knee Pain Descriptors / Indicators: Grimacing;Aching Pain Intervention(s): Limited activity within patient's  tolerance;Monitored during session;Patient requesting pain meds-RN notified;RN gave pain meds during session    Home Living                      Prior Function            PT Goals (current goals can now be found in the care plan section) Acute Rehab PT Goals PT Goal Formulation: With patient/family Time For Goal Achievement: 05/15/20 Potential to Achieve Goals: Good Progress towards PT goals: Progressing toward goals    Frequency    Min 3X/week      PT Plan Current plan remains appropriate    Co-evaluation              AM-PAC PT "6 Clicks" Mobility   Outcome Measure  Help needed turning from your back to your side while in a flat bed without using bedrails?: A Little Help needed moving from lying on your back to sitting on the side of a flat bed without using bedrails?: A Little Help needed moving to and from a bed to a chair (including a wheelchair)?: A Little Help needed standing up from a chair using your arms (e.g., wheelchair or bedside chair)?: A Little Help needed to walk in hospital room?: A Little Help needed climbing 3-5 steps with a railing? : Total 6 Click Score: 16    End of Session Equipment Utilized During Treatment: Oxygen;Gait belt Activity Tolerance: Patient tolerated treatment well Patient left: in chair;with call bell/phone within reach;with chair alarm set Nurse Communication: Mobility status PT Visit Diagnosis: Muscle weakness (generalized) (M62.81);Pain;History of falling (Z91.81) Pain - Right/Left: Right Pain - part of body: Hip     Time: 1111-1200 PT Time Calculation (min) (ACUTE ONLY): 49 min  Charges:  $Gait Training: 8-22 mins $Therapeutic Exercise: 23-37 mins $Therapeutic Activity: 38-52 mins                     Leighton Roach, [place]  Pager [phone] Office [place] 05/11/2020, 12:09 PM

## 2020-05-11 NOTE — Progress Notes (Signed)
Report called to Gas, RN @ [employer] [facility].

## 2020-05-11 NOTE — NC FL2 (Signed)
[place] LEVEL OF CARE SCREENING TOOL     IDENTIFICATION  Patient Name: Kathy Irwin Birthdate: [birth year] Sex: female Admission Date (Current Location): 04/29/2020  [facility] and [place] Number:  Herbalist and Address:  [facility] [place] [address], [place], [place] [address]      Provider Number: [id number]  Attending Physician Name and Address:  Debbe Odea, MD  Relative Name and Phone Number:  [name] [phone]    Current Level of Care: Hospital Recommended Level of Care: [place] Prior Approval Number:    Date Approved/Denied:   PASRR Number: [id number]  Discharge Plan: SNF    Current Diagnoses: Patient Active Problem List   Diagnosis Date Noted  . Multifocal pneumonia 05/05/2020  . Acute metabolic encephalopathy 69/67/8938  . Closed right hip fracture ([place]) 04/29/2020  . Closed intertrochanteric fracture of hip, right, initial encounter ([place]) 04/29/2020  . Fall 04/29/2020  . Transient hypotension 04/29/2020  . Cellulitis of right lower leg 11/09/2019  . Sepsis ([place]) 11/09/2019  . Chronic diastolic CHF (congestive heart failure) ([place]) 08/02/2019  . Acute on chronic respiratory failure with hypoxemia ([place]) 08/02/2019  . Atherosclerosis of aorta ([place]) 01/23/2019  . Obesity, Class III, BMI 40-49.9 (morbid obesity) ([place]) 12/24/2018  . Pneumonia due to COVID-19 virus 11/15/2018  . Hypersensitivity pneumonitis ([place]) 12/19/2017  . Thrombocytopenia ([place]) 12/19/2017  . Insulin-requiring or dependent type II diabetes mellitus ([place]) 12/17/2017  . HTN (hypertension) 12/17/2017  . Essential hypertension 11/05/2017  . 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[place]) 11/05/2017    Orientation RESPIRATION BLADDER Height & Weight     Self,Place  O2 (nasal cannula 3 liters) Incontinent,External catheter (External Urinary Catheter) Weight: 113.5 kg Height:  5' 2.99" (160 cm)  BEHAVIORAL SYMPTOMS/MOOD NEUROLOGICAL BOWEL  NUTRITION STATUS      Incontinent Diet (See Discharge Summary)  AMBULATORY STATUS COMMUNICATION OF NEEDS Skin   Extensive Assist Verbally Other (Comment) (Incision (Closed) thigh Right foam lift dressing to assess site every shift, clean, dry, intact, PRN)                       Personal Care Assistance Level of Assistance  Bathing,Feeding,Dressing Bathing Assistance: Limited assistance Feeding assistance: Limited assistance Dressing Assistance: Limited assistance     Functional Limitations Info  Sight,Hearing,Speech Sight Info: Adequate Hearing Info: Adequate Speech Info: Adequate    SPECIAL CARE FACTORS FREQUENCY  PT (By licensed PT),OT (By licensed OT)     PT Frequency: 5x min weekly OT Frequency: 5x min weekly            Contractures Contractures Info: Not present    Additional Factors Info  Code Status,Allergies,Insulin Sliding Scale Code Status Info: FULL Allergies Info: Other Newspaper ink,Iodine,Merbromin,Tape   Insulin Sliding Scale Info: insulin aspart (novoLOG) injection 0-9 Units 3 times daily with meals       Current Medications (05/11/2020):  This is the current hospital active medication list Current Facility-Administered Medications  Medication Dose Route Frequency Provider Last Rate Last Admin  . 0.9 %  sodium chloride infusion (Manually program via Guardrails IV Fluids)   Intravenous Once Reubin Milan, MD      . 0.9 %  sodium chloride infusion (Manually program via Guardrails IV Fluids)   Intravenous Once [place], Omair Latif, DO      . acetaminophen (TYLENOL) tablet 1,000 mg  1,000 mg Oral Q8H PRN Rizwan, Eunice Blase, MD      . albuterol (VENTOLIN HFA) 108 (90  Base) MCG/ACT inhaler 1-2 puff  1-2 puff Inhalation Q4H PRN Raiford Noble Latif, DO      . aspirin EC tablet 81 mg  81 mg Oral Daily Patrecia Pace A, PA-C   81 mg at 05/11/20 0454  . dextrose 50 % solution 12.5 g  12.5 g Intravenous PRN Delray Alt, PA-C      . diphenhydrAMINE  (BENADRYL) injection 25 mg  25 mg Intravenous Once Vonzella Nipple, NP      . docusate sodium (COLACE) capsule 100 mg  100 mg Oral BID Patrecia Pace A, PA-C   100 mg at 05/11/20 0981  . famotidine (PEPCID) tablet 40 mg  40 mg Oral Daily Patrecia Pace A, PA-C   40 mg at 05/11/20 1914  . fluticasone (FLONASE) 50 MCG/ACT nasal spray 2 spray  2 spray Each Nare Daily PRN Delray Alt, PA-C   2 spray at 05/08/20 1012  . insulin aspart (novoLOG) injection 0-9 Units  0-9 Units Subcutaneous TID WC Delray Alt, PA-C   3 Units at 05/11/20 7829  . linaclotide (LINZESS) capsule 145 mcg  145 mcg Oral QAC breakfast Debbe Odea, MD   145 mcg at 05/11/20 0735  . methylPREDNISolone sodium succinate (SOLU-MEDROL) 1,000 mg in sodium chloride 0.9 % 50 mL IVPB  1,000 mg Intravenous Q24H Debbe Odea, MD   Stopped at 05/10/20 1742  . metoprolol succinate (TOPROL-XL) 24 hr tablet 75 mg  75 mg Oral Daily Raiford Noble [place], DO   75 mg at 05/11/20 5621  . montelukast (SINGULAIR) tablet 10 mg  10 mg Oral QHS Patrecia Pace A, PA-C   10 mg at 05/10/20 2158  . Muscle Rub CREA   Topical PRN Raiford Noble Latif, DO      . naloxone [facility]) injection 0.4 mg  0.4 mg Intravenous PRN Raiford Noble Latif, DO   0.4 mg at 05/02/20 1059  . olopatadine (PATANOL) 0.1 % ophthalmic solution 1 drop  1 drop Both Eyes BID PRN Delray Alt, PA-C      . ondansetron [facility]) tablet 4 mg  4 mg Oral Q6H PRN Patrecia Pace A, PA-C   4 mg at 05/07/20 3086   Or  . ondansetron (ZOFRAN) injection 4 mg  4 mg Intravenous Q6H PRN Delray Alt, PA-C      . pantoprazole (PROTONIX) EC tablet 40 mg  40 mg Oral Daily Patrecia Pace A, PA-C   40 mg at 05/11/20 5784  . polyethylene glycol (MIRALAX / GLYCOLAX) packet 17 g  17 g Oral Daily PRN Patrecia Pace A, PA-C   17 g at 05/07/20 1645  . potassium & sodium phosphates (PHOS-NAK) 280-160-250 MG packet 1 packet  1 packet Oral TID WC & HS Raiford Noble [place], DO   1 packet at 05/11/20 [phone]  .  pravastatin (PRAVACHOL) tablet 40 mg  40 mg Oral QHS Patrecia Pace A, PA-C   40 mg at 05/10/20 2158  . protein supplement (ENSURE MAX) liquid  11 oz Oral TID Patrecia Pace A, PA-C   11 oz at 05/04/20 0951  . senna-docusate (Senokot-S) tablet 1 tablet  1 tablet Oral Q0600 Delray Alt, PA-C   1 tablet at 05/11/20 0510  . sertraline (ZOLOFT) tablet 100 mg  100 mg Oral Daily Debbe Odea, MD   100 mg at 05/11/20 9528  . simethicone (MYLICON) chewable tablet 80 mg  80 mg Oral QID PRN Delray Alt, PA-C   80 mg at 05/09/20 2009  .  sodium chloride (OCEAN) 0.65 % nasal spray 1 spray  1 spray Each Nare PRN Patrecia Pace A, PA-C      . sodium chloride 0.9 % bolus 500 mL  500 mL Intravenous Once [place], Omair Latif, DO      . traMADol Veatrice [place]) tablet 50 mg  50 mg Oral Q6H PRN Debbe Odea, MD   50 mg at 05/10/20 2032     Discharge Medications: Please see discharge summary for a list of discharge medications.  Relevant Imaging Results:  Relevant Lab Results:   Additional Information [phone]  Dawayne Patricia, RN

## 2020-05-11 NOTE — Discharge Summary (Addendum)
Physician Discharge Summary  Kathy Irwin FWY:[id number] DOB: [birth year] DOA: 04/29/2020  PCP: Kerin Perna, NP  Admit date: 04/29/2020 Discharge date: 05/11/2020  Admitted From: home Disposition:  SNF   Recommendations for Outpatient Follow-up:  1. F/u pulse ox daily and wean to room air 2. She uses 3 L O2 at night only-  3. Check CBC Q 3 days and follow platelet counts  Home Health:  none  Discharge Condition:  stable   CODE STATUS:  Full code   Diet recommendation:  Heart healthy diet, fluid restrict to 1.5 L a day Consultations:  Orthopedic surgery  Pulmonary  Procedures/Studies: . IM nail of right hip   Discharge Diagnoses:  Principal Problem:   Closed intertrochanteric fracture of hip, right, initial encounter   Active Problems:   Acute on chronic respiratory failure with hypoxemia     Multifocal pneumonia   Acute metabolic encephalopathy   Insulin-requiring or dependent type II diabetes mellitus     Hypersensitivity pneumonitis     Chronic myeloid leukemia     ITP   Acute on chronic Chronic diastolic CHF (congestive heart failure) ([facility])   Fall   Transient hypotension     Brief Summary: Kathy Irwin is a 70 y.o.femalewith medical history significant ofHTN, interstitial lung disease (hypersensitivity pneumonitis),chronic respiratory failure with hypoxia on 2 L at night,DM type II, ITP,CMML-1,and obesity presents after having a mechanicl fall while at work and right hip pain.   In ED > subtrochanteric fracture of right femur RR in 30s Platelets in 30s.   1/14 underwent IM nail/ percutaneous fixation of femoral neck fractuire 1/15 Hb 6.9 from 8.9 1/16- increasingly lethargic after IV Morphine- code stroke called and notes state she was obtunded- CTA chest revealed multifocal infiltrates consistent with pneumonia, more hypoxic- vitals> 79/36, temp 100.2, HR 151, RR in 30s,  Narcan given with some improvement in lethargy but ongoing confusion-   MRI ordered but patient unable to cooperate  1/17, pulse ox 85% on room air, HR in 130s- pulm consulted- PT recommends SNF, FL2 done  1/18- MRI attempted- pt did not allow being moved to exam table  I became her attending on 1/70/22.     Hospital Course:  Principal Problem:   Closed intertrochanteric fracture of hip, right, initial encounter  - repaired on 1/14- PT recommended SNF - Aspirin 81 mg ordered by ortho for DVT prophylaxis  Active Problems:   Acute on chronic respiratory failure with hypoxemia ([facility])    History of Hypersensitivity pneumonitis     Multifocal pneumonia with sepsis- with tachycardia, fever and leukocytosis on 1/16    Acute on chronic diastolic CHF - aspiration vs CAP -  antibiotics started on 1/17>  Vancomycin and Cefepime - d/c Vancomycin on 1/19 as I noted that MRSA PCR was negative on 1/13 - wean O2 as able- she typically uses 3 L At night at home - started lasix for pulmonary edema noted on CXR and on exam    - Cefepime and IV Lasix d/c'd on 1/24 - 1/24> CXR noted resolution of infiltrates - she has remained on 1 L O2 while alert- suspect atlectasis as she has no been very mobile and spends all day laying in the bed    Acute metabolic encephalopathy - noted to occur on 1/16 and I suspect it was related to acute infection (pneumonia) complicated by hypoxia and narcotics   - per daughter, the patient was alert and oriented on 1/18- it seems that she became agitated and  confused after receiving Ativan to attempt an MRI- will avoid Benzodizepines  -she is has been back to her baseline mental status since 1/20 - d/c MRI as at this point I do not think she had a CVA - Using only Tramadol PRN for pain which, per report from daughter does not adversely affect her - Avoid Ativan and heavy narcotics in the future    CMML 1 and thrombocytopenia - no treatment indicated fro CMML per oncology note on 1/18 - given 3 U total of pheresed platelets thus far-  platelets are in 70,000 range- use steroids for ITP if platelets < 50K - plt dropped to 46 on 1/22 a are 48 today- given 3 days of IV Solumedrol and have subsequently improved - will request labs be checked Q 2-3 days at SNF to follow platelets   Anemia - baseline Hb ~ 11 - likely acute blood loss due to fracture in setting of AOCD - has received a total of 4 u PRBC so far - Hb remaining steady at 7-8 range    DM2 Labs (Brief)          Component Value Date/Time   HGBA1C 6.7 (H) 04/30/2020 0334    - cont SSI- holding Amaryl and Metformin       Discharge Exam: Vitals:   05/11/20 0738 05/11/20 1229  BP: (!) 142/75 (!) 150/82  Pulse:  68  Resp:  20  Temp: 97.6 F (36.4 C) 97.7 F (36.5 C)  SpO2:  91%   Vitals:   05/10/20 2348 05/11/20 0514 05/11/20 0738 05/11/20 1229  BP: 136/73 (!) 151/74 (!) 142/75 (!) 150/82  Pulse: 87 72  68  Resp: (!) _0 Temp: 98.3 F (36.8 C) 97.7 F (36.5 C) 97.6 F (36.4 C) 97.7 F (36.5 C)  TempSrc: Oral Oral Oral Oral  SpO2: 94% 97%  91%  Weight:  113.5 kg    Height:        General: Pt is alert, awake, not in acute distress Cardiovascular: RRR, S1/S2 +, no rubs, no gallops Respiratory: CTA bilaterally, no wheezing, no rhonchi Abdominal: Soft, NT, ND, bowel sounds + Extremities: no edema, no cyanosis   Discharge Instructions  Discharge Instructions    Diet - low sodium heart healthy   Complete by: As directed    Diet Carb Modified   Complete by: As directed    Increase activity slowly   Complete by: As directed    No wound care   Complete by: As directed      Allergies as of 05/11/2020      Reactions   Other Shortness Of Breath, Other (See Comments)   Newspaper ink =  new chest pain, also   Ativan [lorazepam] Other (See Comments)   Severe confusion and agitation.    Iodine Other (See Comments)   Unknown reaction   Merbromin Other (See Comments)   Unknown reaction - Mercurochrome- "Was a long time ago"     Tape Rash      Medication List    TAKE these medications   acetaminophen 500 MG tablet Commonly known as: TYLENOL Take 1,000 mg by mouth at bedtime.   albuterol 108 (90 Base) MCG/ACT inhaler Commonly known as: VENTOLIN HFA INHALE 1-2 PUFFS BY MOUTH EVERY 6 HOURS AS NEEDED FOR WHEEZE OR SHORTNESS OF BREATH What changed: See the new instructions.   aspirin 81 MG EC tablet Take 1 tablet (81 mg total) by mouth daily. Swallow whole.   blood  glucose meter kit and supplies Dispense based on patient and insurance preference. Use up to four times daily as directed. (FOR ICD-10 E10.9, E11.9).   blood glucose meter kit and supplies Kit Dispense based on patient and insurance preference. Use up to four times daily as directed. (FOR ICD-9 250.00, 250.01). For QAC - HS accuchecks.   cetirizine 10 MG tablet Commonly known as: ZyrTEC Allergy Take 1 tablet (10 mg total) by mouth daily for 7 days.   [employer] D3 125 MCG (5000 UT) capsule Generic drug: Cholecalciferol Take 5,000 Units by mouth daily.   Ensure Max Protein Liqd Take 330 mLs (11 oz total) by mouth 3 (three) times daily.   famotidine 40 MG tablet Commonly known as: PEPCID Take 1 tablet (40 mg total) by mouth daily.   fluticasone 50 MCG/ACT nasal spray Commonly known as: FLONASE Place 2 sprays into both nostrils daily as needed for allergies or rhinitis.   FREESTYLE LITE test strip Generic drug: glucose blood For glucose testing every before meals at bedtime. Diagnosis E 11.65  Can substitute to any accepted brand   furosemide 20 MG tablet Commonly known as: LASIX Take 1 pill in the morning What changed:   how much to take  how to take this  when to take this  additional instructions   glimepiride 4 MG tablet Commonly known as: AMARYL Take 4 mg by mouth daily.   linaclotide 145 MCG Caps capsule Commonly known as: LINZESS Take 1 capsule (145 mcg total) by mouth daily before breakfast.   magnesium gluconate  30 MG tablet Commonly known as: MAGONATE Take 1 tablet (30 mg total) by mouth 2 (two) times daily. What changed: when to take this   metFORMIN 1000 MG tablet Commonly known as: GLUCOPHAGE Take 1 tablet (1,000 mg total) by mouth 2 (two) times daily with a meal. What changed: when to take this   methocarbamol 500 MG tablet Commonly known as: Robaxin Take 1 tablet (500 mg total) by mouth every 8 (eight) hours as needed for muscle spasms.   metoprolol succinate 25 MG 24 hr tablet Commonly known as: TOPROL-XL Take 1 tablet (25 mg total) by mouth daily. What changed:   when to take this  additional instructions   metoprolol succinate 50 MG 24 hr tablet Commonly known as: TOPROL-XL TAKE 1 TABLET BY MOUTH EVERY DAY IN THE MORNING What changed: See the new instructions.   montelukast 10 MG tablet Commonly known as: SINGULAIR Take 10 mg by mouth at bedtime.   multivitamin with minerals Tabs tablet Take 1 tablet by mouth daily.   olopatadine 0.1 % ophthalmic solution Commonly known as: Patanol Place 1 drop into both eyes 2 (two) times daily. What changed:   when to take this  reasons to take this   ondansetron 4 MG tablet Commonly known as: ZOFRAN Take 1 tablet (4 mg total) by mouth every 8 (eight) hours as needed for nausea or vomiting.   oxybutynin 5 MG tablet Commonly known as: DITROPAN Take 1 tablet (5 mg total) by mouth 3 (three) times daily.   pantoprazole 40 MG tablet Commonly known as: PROTONIX Take 1 tablet (40 mg total) by mouth daily.   polyethylene glycol 17 g packet Commonly known as: MIRALAX / GLYCOLAX Take 17 g by mouth daily as needed for mild constipation or moderate constipation.   pravastatin 40 MG tablet Commonly known as: PRAVACHOL Take 1 tablet (40 mg total) by mouth at bedtime.   pregabalin 100 MG capsule Commonly known  as: Lyrica Take 1 capsule (100 mg total) by mouth at bedtime.   senna-docusate 8.6-50 MG tablet Commonly known as:  Senokot-S Take 1 tablet by mouth 2 (two) times daily.   sertraline 100 MG tablet Commonly known as: ZOLOFT Take 1 pill at bedtime   simethicone 80 MG chewable tablet Commonly known as: MYLICON Chew 1 tablet (80 mg total) by mouth 4 (four) times daily as needed for flatulence.   sodium chloride 0.65 % Soln nasal spray Commonly known as: OCEAN Place 1 spray into both nostrils as needed for congestion.   tamsulosin 0.4 MG Caps capsule Commonly known as: FLOMAX Take 1 capsule (0.4 mg total) by mouth daily.   traMADol 50 MG tablet Commonly known as: ULTRAM Take 1 tablet (50 mg total) by mouth every 6 (six) hours as needed for severe pain.       Contact information for follow-up providers    Haddix, Thomasene Lot, MD. Schedule an appointment as soon as possible for a visit in 2 weeks.   Specialty: Orthopedic Surgery Why: For wound re-check Contact information: [place] [address] [phone]            Contact information for after-discharge care    Destination    [facility] Preferred SNF .   Service: Skilled Nursing Contact information: [address] [place] [address] [phone]                 Allergies  Allergen Reactions  . Other Shortness Of Breath and Other (See Comments)    Newspaper ink =  new chest pain, also  . Ativan [Lorazepam] Other (See Comments)    Severe confusion and agitation.   . Iodine Other (See Comments)    Unknown reaction  . Merbromin Other (See Comments)    Unknown reaction - Mercurochrome- "Was a long time ago"   . Tape Rash      CT Code Stroke CTA Head W/WO contrast  Result Date: 05/02/2020 CLINICAL DATA:  70 year old female code stroke presentation. Altered mental status. Recent right femur ORIF. EXAM: CT ANGIOGRAPHY HEAD AND NECK TECHNIQUE: Multidetector CT imaging of the head and neck was performed using the standard protocol during bolus administration of intravenous  contrast. Multiplanar CT image reconstructions and MIPs were obtained to evaluate the vascular anatomy. Carotid stenosis measurements (when applicable) are obtained utilizing NASCET criteria, using the distal internal carotid diameter as the denominator. CONTRAST:  128m OMNIPAQUE IOHEXOL 350 MG/ML SOLN COMPARISON:  Plain head CT 1123 hours today. CTA Chest today reported separately. FINDINGS: CTA NECK Skeleton: Bulky disc and endplate degeneration throughout the cervical spine. No acute osseous abnormality identified. Upper chest: Patchy angiographic bilateral upper lung opacity. See comparison. Other neck: Negative thyroid for age. Small volume retained secretions in the pharynx. Paranasal sinuses as described on the head CT today. No other acute finding. Aortic arch: Mild to moderate distal arch atherosclerosis. Three vessel arch configuration. Right carotid system: Mildly tortuous brachiocephalic artery and proximal right CCA without stenosis. Mild calcified plaque at the posterior right ICA origin without stenosis. Tortuous right ICA. Left carotid system: Tortuous proximal left CCA without stenosis. Mild soft plaque suspected at the level of the thyroid, no stenosis. Similar mild soft plaque at the left carotid bifurcation, left ICA origin. No stenosis. Mildly tortuous left ICA. Vertebral arteries: Tortuous proximal right subclavian artery with mild calcified plaque, no stenosis. Right vertebral artery origin is within normal limits, detail of the right V1 segment is  suboptimal. But the right vertebral is patent elsewhere in the neck and to the skull base without stenosis. Mildly tortuous proximal left subclavian artery with mild calcified plaque and no stenosis. Normal left vertebral artery origin. Mildly tortuous left V1 segment. Similar suboptimal vertebral artery detail as on the right, but the left vertebral remains patent to the skull base with no convincing stenosis. CTA HEAD Posterior circulation:  Patent vertebral V4 segments without significant stenosis, the right is dominant. Left PICA origin is patent. Right AICA appears dominant and patent. Patent basilar artery with mild irregularity but no stenosis. Patent SCA origins. Fetal type bilateral PCA origins. Bilateral PCA branches are within normal limits. Anterior circulation: Both ICA siphons are patent. Mild for age bilateral siphon calcified plaque. No siphon stenosis. Normal posterior communicating artery origins. Patent carotid termini. Patent MCA and ACA origins. Anterior communicating artery and bilateral ACA branches are within normal limits. Left MCA M1 segment and bifurcation are patent without stenosis. Right MCA M1 segment and bifurcation are patent without stenosis. Bilateral MCA branches are within normal limits. Venous sinuses: Patent. Anatomic variants: Fetal type bilateral PCA origins. Subsequent diminutive vertebrobasilar system. Review of the MIP images confirms the above findings IMPRESSION: 1. Negative for large vessel occlusion. 2. 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 No hemodynamically significant arterial stenosis identified. 3. Patchy bilateral upper lung opacity suggestive of acute viral/atypical respiratory infection. See CTA chest reported separately. These results were communicated to Dr. Rory Percy at 12:01 pm on 05/02/2020 by text page via the [facility] messaging system. Electronically Signed   By: Genevie Ann M.D.   On: 05/02/2020 12:02   DG Chest 1 View  Result Date: 04/29/2020 CLINICAL DATA:  Pain following fall EXAM: CHEST  1 VIEW COMPARISON:  October 15, 2019 FINDINGS: Lungs are clear. Heart size and pulmonary vascularity are normal. No pneumothorax. No adenopathy. No bone lesions. IMPRESSION: Lungs clear.  Cardiac silhouette normal. Electronically Signed   By: Lowella Grip III M.D.   On: 04/29/2020 12:03   CT Code Stroke CTA Neck W/WO contrast  Result Date: 05/02/2020 CLINICAL DATA:  70 year old female code  stroke presentation. Altered mental status. Recent right femur ORIF. EXAM: CT ANGIOGRAPHY HEAD AND NECK TECHNIQUE: Multidetector CT imaging of the head and neck was performed using the standard protocol during bolus administration of intravenous contrast. Multiplanar CT image reconstructions and MIPs were obtained to evaluate the vascular anatomy. Carotid stenosis measurements (when applicable) are obtained utilizing NASCET criteria, using the distal internal carotid diameter as the denominator. CONTRAST:  185m OMNIPAQUE IOHEXOL 350 MG/ML SOLN COMPARISON:  Plain head CT 1123 hours today. CTA Chest today reported separately. FINDINGS: CTA NECK Skeleton: Bulky disc and endplate degeneration throughout the cervical spine. No acute osseous abnormality identified. Upper chest: Patchy angiographic bilateral upper lung opacity. See comparison. Other neck: Negative thyroid for age. Small volume retained secretions in the pharynx. Paranasal sinuses as described on the head CT today. No other acute finding. Aortic arch: Mild to moderate distal arch atherosclerosis. Three vessel arch configuration. Right carotid system: Mildly tortuous brachiocephalic artery and proximal right CCA without stenosis. Mild calcified plaque at the posterior right ICA origin without stenosis. Tortuous right ICA. Left carotid system: Tortuous proximal left CCA without stenosis. Mild soft plaque suspected at the level of the thyroid, no stenosis. Similar mild soft plaque at the left carotid bifurcation, left ICA origin. No stenosis. Mildly tortuous left ICA. Vertebral arteries: Tortuous proximal right subclavian artery with mild calcified plaque, no stenosis.  Right vertebral artery origin is within normal limits, detail of the right V1 segment is suboptimal. But the right vertebral is patent elsewhere in the neck and to the skull base without stenosis. Mildly tortuous proximal left subclavian artery with mild calcified plaque and no stenosis. Normal  left vertebral artery origin. Mildly tortuous left V1 segment. Similar suboptimal vertebral artery detail as on the right, but the left vertebral remains patent to the skull base with no convincing stenosis. CTA HEAD Posterior circulation: Patent vertebral V4 segments without significant stenosis, the right is dominant. Left PICA origin is patent. Right AICA appears dominant and patent. Patent basilar artery with mild irregularity but no stenosis. Patent SCA origins. Fetal type bilateral PCA origins. Bilateral PCA branches are within normal limits. Anterior circulation: Both ICA siphons are patent. Mild for age bilateral siphon calcified plaque. No siphon stenosis. Normal posterior communicating artery origins. Patent carotid termini. Patent MCA and ACA origins. Anterior communicating artery and bilateral ACA branches are within normal limits. Left MCA M1 segment and bifurcation are patent without stenosis. Right MCA M1 segment and bifurcation are patent without stenosis. Bilateral MCA branches are within normal limits. Venous sinuses: Patent. Anatomic variants: Fetal type bilateral PCA origins. Subsequent diminutive vertebrobasilar system. Review of the MIP images confirms the above findings IMPRESSION: 1. Negative for large vessel occlusion. 2. 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 No hemodynamically significant arterial stenosis identified. 3. Patchy bilateral upper lung opacity suggestive of acute viral/atypical respiratory infection. See CTA chest reported separately. These results were communicated to Dr. Rory Percy at 12:01 pm on 05/02/2020 by text page via the [facility] messaging system. Electronically Signed   By: Genevie Ann M.D.   On: 05/02/2020 12:02   CT ANGIO CHEST PE W OR WO CONTRAST  Result Date: 05/02/2020 CLINICAL DATA:  High probability of pulmonary embolus. EXAM: CT ANGIOGRAPHY CHEST WITH CONTRAST TECHNIQUE: Multidetector CT imaging of the chest was performed using the standard protocol during  bolus administration of intravenous contrast. Multiplanar CT image reconstructions and MIPs were obtained to evaluate the vascular anatomy. CONTRAST:  122m OMNIPAQUE IOHEXOL 350 MG/ML SOLN COMPARISON:  August 15, 2019. FINDINGS: Cardiovascular: There is no evidence of large central pulmonary embolus seen in the main pulmonary artery or main portions of the left and right pulmonary arteries. Evaluation of the lower lobe branches of both pulmonary arteries is limited due to respiratory motion artifact and attenuation due to body habitus. Smaller and more peripheral pulmonary emboli cannot be excluded on the basis of this exam. Normal cardiac size. No pericardial effusion. No evidence of thoracic aortic dissection or aneurysm. Mediastinum/Nodes: No enlarged mediastinal, hilar, or axillary lymph nodes. Thyroid gland, trachea, and esophagus demonstrate no significant findings. Lungs/Pleura: No pneumothorax or pleural effusion is noted. Patchy airspace opacities are noted throughout both lungs which may represent multifocal pneumonia. Upper Abdomen: No acute abnormality. Musculoskeletal: No chest wall abnormality. No acute or significant osseous findings. Review of the MIP images confirms the above findings. IMPRESSION: 1. There is no evidence of large central pulmonary embolus seen in the main pulmonary artery or main portions of the left and right pulmonary arteries. Evaluation of the lower lobe branches of both pulmonary arteries is limited due to respiratory motion artifact and attenuation due to body habitus. Smaller and more peripheral pulmonary emboli cannot be excluded on the basis of this exam. 2. Patchy airspace opacities are noted throughout both lungs which may represent multifocal pneumonia. Electronically Signed   By: JMarijo Conception  M.D.   On: 05/02/2020 12:09   DG Chest Port 1 View  Result Date: 05/10/2020 CLINICAL DATA:  Shortness of breath Pneumonia Hypoxia EXAM: PORTABLE CHEST 1 VIEW COMPARISON:   05/05/2020 FINDINGS: Heart size is within normal limits. No significant pulmonary vascular congestion. Interval improvement in bilateral airspace opacities with mild interstitial opacities still remaining in the mid and lower lungs. IMPRESSION: Interval improvement in aeration of the lungs, most likely due to decreasing pulmonary edema or pneumonia. Electronically Signed   By: Miachel Roux M.D.   On: 05/10/2020 07:46   DG CHEST PORT 1 VIEW  Result Date: 05/05/2020 CLINICAL DATA:  Shortness of breath. EXAM: PORTABLE CHEST 1 VIEW COMPARISON:  05/03/2020. FINDINGS: Cardiomegaly. Diffuse bilateral pulmonary interstitial edema/infiltrates, progressed from prior exam. No pleural effusion or pneumothorax. No acute bony abnormality. IMPRESSION: 1. Cardiomegaly. 2. Diffuse bilateral pulmonary interstitial edema/infiltrates, progressed from prior exam. Electronically Signed   By: Marcello Moores  Register   On: 05/05/2020 06:25   DG CHEST PORT 1 VIEW  Result Date: 05/03/2020 CLINICAL DATA:  70 year old female with shortness of breath. Confusion, altered mental status. Recent hip fracture, surgery. Negative for COVID-19 yesterday and on 04/29/2020. EXAM: PORTABLE CHEST 1 VIEW COMPARISON:  CTA chest yesterday and earlier. FINDINGS: Portable AP semi upright view at 0933 hours. New since portable chest x-ray 04/29/2020 multifocal bilateral patchy and indistinct pulmonary opacities. Lung apices appear relatively spared. Both lower lungs affected. Mildly lower lung volumes. Stable cardiac size and mediastinal contours. Visualized tracheal air column is within normal limits. No pneumothorax or pleural effusion. No air bronchograms. No acute osseous abnormality identified. IMPRESSION: Multifocal bilateral patchy and indistinct pulmonary opacity is new compared to 04/29/2020, and compatible with Acute Pneumonia superimposed on chronic interstitial lung disease (which was demonstrated on Chest CTs 2021 and earlier). Electronically  Signed   By: Genevie Ann M.D.   On: 05/03/2020 09:43   DG C-Arm 1-60 Min  Result Date: 04/30/2020 CLINICAL DATA:  Trauma.  Intramedullary nail right femur. EXAM: RIGHT FEMUR 2 VIEWS; DG C-ARM 1-60 MIN COMPARISON:  Radiographs April 29, 2020. FINDINGS: Fluoro time: 6 minutes and 38 seconds. Reported radiation: 166.89 mGy. Eleven C-arm fluoroscopic images were obtained intraoperatively and submitted for post operative interpretation. These images demonstrate sequential changes associated with right intramedullary nail and screw fixation of a intertrochanteric femur fracture. Final images demonstrate improved, near anatomic alignment without unexpected findings. Please see the performing provider's procedural report for further detail. IMPRESSION: Intraoperative fluoroscopic imaging, as detailed above. Electronically Signed   By: Margaretha Sheffield MD   On: 04/30/2020 15:16   MM CLIP PLACEMENT LEFT  Result Date: 04/20/2020 CLINICAL DATA:  Post biopsy mammogram of the left breast for clip placement. EXAM: DIAGNOSTIC LEFT MAMMOGRAM POST STEREOTACTIC BIOPSY COMPARISON:  Previous exam(s). FINDINGS: Mammographic images were obtained following stereotactic guided biopsy of calcifications in the upper-outer left breast. The biopsy marking clip is in expected position at the site of biopsy. IMPRESSION: Appropriate positioning of the coil shaped biopsy marking clip at the site of biopsy in the upper-outer left breast. Final Assessment: Post Procedure Mammograms for Marker Placement Electronically Signed   By: Ammie Ferrier M.D.   On: 04/20/2020 12:36   DG Hip Unilat W or Wo Pelvis 2-3 Views Right  Result Date: 04/29/2020 CLINICAL DATA:  Pain following fall EXAM: DG HIP (WITH OR WITHOUT PELVIS) 2-3V RIGHT COMPARISON:  None. FINDINGS: Frontal pelvis as well as frontal and lateral right hip images were obtained. There is a comminuted intertrochanteric femur  fracture on the right with impaction at the fracture site and  varus angulation. There is avulsion of the greater trochanter on the right. No other fractures. No dislocation. There is no appreciable joint space narrowing. There is bony overgrowth along the superolateral aspect of each acetabulum. There is degenerative change in the lower lumbar spine. IMPRESSION: Comminuted intertrochanteric femur fracture on the right with impaction and varus angulation at the fracture site as well as avulsion of the greater trochanter. No other fracture. No dislocation. 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 Bony overgrowth along the superolateral aspect of each acetabulum, a finding potentially placing patient at increased risk for femoroacetabular impingement. Electronically Signed   By: Lowella Grip III M.D.   On: 04/29/2020 12:00   DG FEMUR, MIN 2 VIEWS RIGHT  Result Date: 04/30/2020 CLINICAL DATA:  Trauma.  Intramedullary nail right femur. EXAM: RIGHT FEMUR 2 VIEWS; DG C-ARM 1-60 MIN COMPARISON:  Radiographs April 29, 2020. FINDINGS: Fluoro time: 6 minutes and 38 seconds. Reported radiation: 166.89 mGy. Eleven C-arm fluoroscopic images were obtained intraoperatively and submitted for post operative interpretation. These images demonstrate sequential changes associated with right intramedullary nail and screw fixation of a intertrochanteric femur fracture. Final images demonstrate improved, near anatomic alignment without unexpected findings. Please see the performing provider's procedural report for further detail. IMPRESSION: Intraoperative fluoroscopic imaging, as detailed above. Electronically Signed   By: Margaretha Sheffield MD   On: 04/30/2020 15:16   DG FEMUR PORT, MIN 2 VIEWS RIGHT  Result Date: 04/30/2020 CLINICAL DATA:  Right hip ORIF, postoperative assessment EXAM: RIGHT FEMUR PORTABLE 2 VIEW COMPARISON:  02/28/2021 FINDINGS: Right intertrochanteric fracture ORIF noted with intramedullary nail component with distal interlocking screws, a long  threaded axial femoral neck screw component, and a distal threaded femoral neck screw component. The patient has a known greater trochanteric fragment which is better aligned than on the preoperative radiographs. No new fracture or new complicating feature. Mild spurring of the right acetabulum noted. 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 IMPRESSION: Right intertrochanteric fracture ORIF without complicating feature identified. Electronically Signed   By: Van Clines M.D.   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 This was found to be concordant by Dr. Ammie Ferrier. Pathology results were discussed with the patient by telephone. The patient reported doing well after the biopsy with tenderness at the site. Post biopsy instructions and care were reviewed and questions were answered. The patient was encouraged to call [facility] for any additional concerns. The patient was instructed to return for annual screening mammography and informed a reminder notice would be sent regarding this appointment. Pathology results reported by Stacie Acres RN on 04/21/2020. Electronically Signed   By: Ammie Ferrier M.D.   On: 04/21/2020 11:45   Result Date: 04/22/2020 CLINICAL DATA:  70 year old female presenting for stereotactic biopsy of calcifications in the upper-outer left breast. EXAM: LEFT BREAST STEREOTACTIC CORE NEEDLE BIOPSY COMPARISON:  Previous exams. FINDINGS: The patient and I discussed the procedure of stereotactic-guided biopsy including benefits and alternatives. We discussed the high likelihood of a successful procedure. We discussed the risks of the procedure including infection, bleeding, tissue injury, clip migration, and inadequate sampling. Informed  written consent was given. The usual time out protocol was performed immediately  prior to the procedure. Using sterile technique and 1% Lidocaine as local anesthetic, under stereotactic guidance, a 9 gauge vacuum assisted device was used to perform core needle biopsy of calcifications in the upper-outer quadrant of the left breast using a superior approach. Specimen radiograph was performed showing calcifications in several core samples. Specimens with calcifications are identified for pathology. Lesion quadrant: Upper outer quadrant At the conclusion of the procedure, a coil shaped tissue marker clip was deployed into the biopsy cavity. Follow-up 2-view mammogram was performed and dictated separately. IMPRESSION: Stereotactic-guided biopsy of calcifications in the upper-outer left breast. No apparent complications. Electronically Signed: By: Ammie Ferrier M.D. On: 04/20/2020 12:37   CT HEAD CODE STROKE WO CONTRAST  Result Date: 05/02/2020 CLINICAL DATA:  Code stroke. 70 year old female altered mental status. EXAM: CT HEAD WITHOUT CONTRAST TECHNIQUE: Contiguous axial images were obtained from the base of the skull through the vertex without intravenous contrast. COMPARISON:  Head CT 11/09/2019. FINDINGS: Brain: Stable dystrophic calcifications bilateral basal ganglia. No midline shift, ventriculomegaly, mass effect, evidence of mass lesion, intracranial hemorrhage or evidence of cortically based acute infarction. Gray-white matter differentiation is within normal limits throughout the brain. Vascular: Calcified atherosclerosis at the skull base. No suspicious intracranial vascular hyperdensity. Skull: No acute osseous abnormality identified. Sinuses/Orbits: Generalized mild to moderate paranasal sinus mucosal thickening is new. No sinus fluid levels. Tympanic cavities and mastoids remain clear. Other: Visualized orbits and scalp soft tissues are within normal limits. ASPECTS [facility] Stroke Program Early CT  Score) Total score (0-10 with 10 being normal): 10 IMPRESSION: 1. Stable and normal for age noncontrast CT appearance of the brain. ASPECTS 10. 2. New generalized paranasal sinus inflammation. 3. These results were communicated to Dr. Rory Percy at 11:35 am on 05/02/2020 by text page via the [facility] messaging system. Electronically Signed   By: Genevie Ann M.D.   On: 05/02/2020 11:36   VAS [place] LOWER EXTREMITY VENOUS (DVT)  Result Date: 05/04/2020  Lower Venous DVT Study Indications: Edema.  Limitations: Poor ultrasound/tissue interface and body habitus. Comparison Study: 11/09/19 previous Performing Technologist: Abram Sander RVS  Examination Guidelines: A complete evaluation includes B-mode imaging, spectral Doppler, color Doppler, and power Doppler as needed of all accessible portions of each vessel. Bilateral testing is considered an integral part of a complete examination. Limited examinations for reoccurring indications may be performed as noted. The reflux portion of the exam is performed with the patient in reverse Trendelenburg.  +---------+---------------+---------+-----------+----------+--------------+ RIGHT    CompressibilityPhasicitySpontaneityPropertiesThrombus Aging +---------+---------------+---------+-----------+----------+--------------+ CFV      Full           Yes      Yes                                 +---------+---------------+---------+-----------+----------+--------------+ SFJ      Full                                                        +---------+---------------+---------+-----------+----------+--------------+ FV Prox  Full                                                        +---------+---------------+---------+-----------+----------+--------------+  FV Mid                  Yes      Yes                                 +---------+---------------+---------+-----------+----------+--------------+ FV Distal               Yes      Yes                                  +---------+---------------+---------+-----------+----------+--------------+ PFV      Full                                                        +---------+---------------+---------+-----------+----------+--------------+ POP      Full           Yes      Yes                                 +---------+---------------+---------+-----------+----------+--------------+ PTV      Full                                                        +---------+---------------+---------+-----------+----------+--------------+ PERO     Full                                                        +---------+---------------+---------+-----------+----------+--------------+   +---------+---------------+---------+-----------+----------+--------------+ LEFT     CompressibilityPhasicitySpontaneityPropertiesThrombus Aging +---------+---------------+---------+-----------+----------+--------------+ CFV      Full           Yes      Yes                                 +---------+---------------+---------+-----------+----------+--------------+ SFJ      Full                                                        +---------+---------------+---------+-----------+----------+--------------+ FV Prox  Full                                                        +---------+---------------+---------+-----------+----------+--------------+ FV Mid                  Yes      Yes                                 +---------+---------------+---------+-----------+----------+--------------+  FV Distal               Yes      Yes                                 +---------+---------------+---------+-----------+----------+--------------+ PFV      Full                                                        +---------+---------------+---------+-----------+----------+--------------+ POP      Full           Yes      Yes                                  +---------+---------------+---------+-----------+----------+--------------+ PTV      Full                                                        +---------+---------------+---------+-----------+----------+--------------+ PERO     Full                                                        +---------+---------------+---------+-----------+----------+--------------+     Summary: BILATERAL: - No evidence of deep vein thrombosis seen in the lower extremities, bilaterally. - No evidence of superficial venous thrombosis in the lower extremities, bilaterally. -No evidence of popliteal cyst, bilaterally.   *See table(s) above for measurements and observations. Electronically signed by Deitra Mayo MD on 05/04/2020 at 1:40:27 PM.    Final      The results of significant diagnostics from this hospitalization (including imaging, microbiology, ancillary and laboratory) are listed below for reference.     Microbiology: Recent Results (from the past 240 hour(s))  Resp Panel by RT-PCR (Flu A&B, Covid) Nasopharyngeal Swab     Status: None   Collection Time: 05/02/20  1:01 PM   Specimen: Nasopharyngeal Swab; Nasopharyngeal(NP) swabs in vial transport medium  Result Value Ref Range Status   SARS Coronavirus 2 by RT PCR NEGATIVE NEGATIVE Final    Comment: (NOTE) SARS-CoV-2 target nucleic acids are NOT DETECTED.  The SARS-CoV-2 RNA is generally detectable in upper respiratory specimens during the acute phase of infection. The lowest concentration of SARS-CoV-2 viral copies this assay can detect is 138 copies/mL. A negative result does not preclude SARS-Cov-2 infection and should not be used as the sole basis for treatment or other patient management decisions. A negative result may occur with  improper specimen collection/handling, submission of specimen other than nasopharyngeal swab, presence of viral mutation(s) within the areas targeted by this assay, and inadequate number of  viral copies(<138 copies/mL). A negative result must be combined with clinical observations, patient history, and epidemiological information. The expected result is Negative.  Fact Sheet for Patients:  EntrepreneurPulse.com.au  Fact Sheet for Healthcare Providers:  IncredibleEmployment.be  This test is no t yet approved or cleared by the  [place] Architectural technologist and  has been authorized for detection and/or diagnosis of SARS-CoV-2 by FDA under an Print production planner (EUA). This EUA will remain  in effect (meaning this test can be used) for the duration of the COVID-19 declaration under Section 564(b)(1) of the Act, 21 U.S.C.section 360bbb-3(b)(1), unless the authorization is terminated  or revoked sooner.       Influenza A by PCR NEGATIVE NEGATIVE Final   Influenza B by PCR NEGATIVE NEGATIVE Final    Comment: (NOTE) The Xpert Xpress SARS-CoV-2/FLU/RSV plus assay is intended as an aid in the diagnosis of influenza from Nasopharyngeal swab specimens and should not be used as a sole basis for treatment. Nasal washings and aspirates are unacceptable for Xpert Xpress SARS-CoV-2/FLU/RSV testing.  Fact Sheet for Patients: EntrepreneurPulse.com.au  Fact Sheet for Healthcare Providers: IncredibleEmployment.be  This test is not yet approved or cleared by the [place] FDA and has been authorized for detection and/or diagnosis of SARS-CoV-2 by FDA under an Emergency Use Authorization (EUA). This EUA will remain in effect (meaning this test can be used) for the duration of the COVID-19 declaration under Section 564(b)(1) of the Act, 21 U.S.C. section 360bbb-3(b)(1), unless the authorization is terminated or revoked.  Performed at [facility], [place] [address] [place], [place] [address]   Respiratory (~20 pathogens) panel by PCR     Status: None   Collection Time: 05/02/20  1:01 PM   Specimen:  Nasopharyngeal Swab; Respiratory  Result Value Ref Range Status   Adenovirus NOT DETECTED NOT DETECTED Final   Coronavirus 229E NOT DETECTED NOT DETECTED Final    Comment: (NOTE) The Coronavirus on the Respiratory Panel, DOES NOT test for the novel  Coronavirus (2019 nCoV)    Coronavirus HKU1 NOT DETECTED NOT DETECTED Final   Coronavirus NL63 NOT DETECTED NOT DETECTED Final   Coronavirus OC43 NOT DETECTED NOT DETECTED Final   Metapneumovirus NOT DETECTED NOT DETECTED Final   Rhinovirus / Enterovirus NOT DETECTED NOT DETECTED Final   Influenza A NOT DETECTED NOT DETECTED Final   Influenza B NOT DETECTED NOT DETECTED Final   Parainfluenza Virus 1 NOT DETECTED NOT DETECTED Final   Parainfluenza Virus 2 NOT DETECTED NOT DETECTED Final   Parainfluenza Virus 3 NOT DETECTED NOT DETECTED Final   Parainfluenza Virus 4 NOT DETECTED NOT DETECTED Final   Respiratory Syncytial Virus NOT DETECTED NOT DETECTED Final   Bordetella pertussis NOT DETECTED NOT DETECTED Final   Bordetella Parapertussis NOT DETECTED NOT DETECTED Final   Chlamydophila pneumoniae NOT DETECTED NOT DETECTED Final   Mycoplasma pneumoniae NOT DETECTED NOT DETECTED Final    Comment: Performed at [facility], [place]. [address], [place], [place] [address]  Culture, blood (routine x 2)     Status: None   Collection Time: 05/02/20  2:15 PM   Specimen: BLOOD  Result Value Ref Range Status   Specimen Description BLOOD LEFT ANTECUBITAL  Final   Special Requests   Final    BOTTLES DRAWN AEROBIC AND ANAEROBIC Blood Culture results may not be optimal due to an inadequate volume of blood received in culture bottles   Culture   Final    NO GROWTH 5 DAYS Performed at [facility], [place] [address], [place], [place] [address]    Report Status 05/07/2020 FINAL  Final  Culture, blood (routine x 2)     Status: None   Collection Time: 05/02/20  2:31 PM   Specimen: BLOOD LEFT HAND  Result Value  Ref Range Status   Specimen  Description BLOOD LEFT HAND  Final   Special Requests   Final    BOTTLES DRAWN AEROBIC ONLY Blood Culture adequate volume   Culture   Final    NO GROWTH 5 DAYS Performed at [facility], [address] [place], [place] [address]    Report Status 05/07/2020 FINAL  Final     Labs: BNP (last 3 results) Recent Labs    08/01/19 1429 10/24/19 1924 11/14/19 1036  BNP 93.4 38.0 201.0*   Basic Metabolic Panel: Recent Labs  Lab 05/05/20 0257 05/06/20 0221 05/07/20 0211 05/08/20 0230 05/09/20 0239 05/10/20 0117  NA 137 137 136 135 135 135  K 4.2 4.2 4.0 4.2 4.7 4.7  CL 109 107 104 102 100 100  CO2 _0 GLUCOSE 152* 139* 174* 133* 193* 296*  BUN _1 CREATININE 0.58 0.50 0.58 0.49 0.60 0.70  CALCIUM 8.5* 8.4* 8.7* 8.8* 9.3 9.6  MG 1.6*  --   --   --   --   --   PHOS 2.1*  --   --   --   --   --    Liver Function Tests: Recent Labs  Lab 05/05/20 0257  AST 43*  ALT 24  ALKPHOS 51  BILITOT 1.3*  PROT 5.4*  ALBUMIN 2.1*   No results for input(s): LIPASE, AMYLASE in the last 168 hours. No results for input(s): AMMONIA in the last 168 hours. CBC: Recent Labs  Lab 05/05/20 0257 05/06/20 0221 05/07/20 0211 05/08/20 0230 05/09/20 0239 05/10/20 0117 05/11/20 0119  WBC 17.2*   < > 14.2* 14.5* 15.6* 13.0* 12.7*  NEUTROABS 9.0*  --   --   --   --   --   --   HGB 7.4*   < > 7.3* 7.7* 8.4* 8.4* 8.1*  HCT 23.8*   < > 22.7* 25.1* 26.2* 26.6* 26.0*  MCV 94.4   < > 94.6 96.5 94.9 95.3 95.9  PLT 76*   < > 59* 46* 48* 55* 61*   < > = values in this interval not displayed.   Cardiac Enzymes: No results for input(s): CKTOTAL, CKMB, CKMBINDEX, TROPONINI in the last 168 hours. BNP: Invalid input(s): POCBNP CBG: Recent Labs  Lab 05/10/20 1137 05/10/20 1614 05/10/20 2112 05/11/20 0759 05/11/20 1215  GLUCAP 272* 131* 224* 221* 145*   D-Dimer No results for input(s): DDIMER in the last 72 hours. Hgb A1c No results for input(s): HGBA1C in  the last 72 hours. Lipid Profile No results for input(s): CHOL, HDL, LDLCALC, TRIG, CHOLHDL, LDLDIRECT in the last 72 hours. Thyroid function studies No results for input(s): TSH, T4TOTAL, T3FREE, THYROIDAB in the last 72 hours.  Invalid input(s): FREET3 Anemia work up No results for input(s): VITAMINB12, FOLATE, FERRITIN, TIBC, IRON, RETICCTPCT in the last 72 hours. Urinalysis    Component Value Date/Time   COLORURINE YELLOW 05/04/2020 0050   APPEARANCEUR CLEAR 05/04/2020 0050   LABSPEC 1.014 05/04/2020 0050   PHURINE 7.0 05/04/2020 0050   GLUCOSEU NEGATIVE 05/04/2020 0050   HGBUR NEGATIVE 05/04/2020 0050   BILIRUBINUR NEGATIVE 05/04/2020 0050   KETONESUR NEGATIVE 05/04/2020 0050   PROTEINUR NEGATIVE 05/04/2020 0050   UROBILINOGEN 1.0 08/14/2019 1824   NITRITE NEGATIVE 05/04/2020 0050   LEUKOCYTESUR NEGATIVE 05/04/2020 0050   Sepsis Labs Invalid input(s): PROCALCITONIN,  WBC,  LACTICIDVEN Microbiology Recent Results (from the past 240 hour(s))  Resp Panel  by RT-PCR (Flu A&B, Covid) Nasopharyngeal Swab     Status: None   Collection Time: 05/02/20  1:01 PM   Specimen: Nasopharyngeal Swab; Nasopharyngeal(NP) swabs in vial transport medium  Result Value Ref Range Status   SARS Coronavirus 2 by RT PCR NEGATIVE NEGATIVE Final    Comment: (NOTE) SARS-CoV-2 target nucleic acids are NOT DETECTED.  The SARS-CoV-2 RNA is generally detectable in upper respiratory specimens during the acute phase of infection. The lowest concentration of SARS-CoV-2 viral copies this assay can detect is 138 copies/mL. A negative result does not preclude SARS-Cov-2 infection and should not be used as the sole basis for treatment or other patient management decisions. A negative result may occur with  improper specimen collection/handling, submission of specimen other than nasopharyngeal swab, presence of viral mutation(s) within the areas targeted by this assay, and inadequate number of  viral copies(<138 copies/mL). A negative result must be combined with clinical observations, patient history, and epidemiological information. The expected result is Negative.  Fact Sheet for Patients:  EntrepreneurPulse.com.au  Fact Sheet for Healthcare Providers:  IncredibleEmployment.be  This test is no t yet approved or cleared by the [place] FDA and  has been authorized for detection and/or diagnosis of SARS-CoV-2 by FDA under an Emergency Use Authorization (EUA). This EUA will remain  in effect (meaning this test can be used) for the duration of the COVID-19 declaration under Section 564(b)(1) of the Act, 21 U.S.C.section 360bbb-3(b)(1), unless the authorization is terminated  or revoked sooner.       Influenza A by PCR NEGATIVE NEGATIVE Final   Influenza B by PCR NEGATIVE NEGATIVE Final    Comment: (NOTE) The Xpert Xpress SARS-CoV-2/FLU/RSV plus assay is intended as an aid in the diagnosis of influenza from Nasopharyngeal swab specimens and should not be used as a sole basis for treatment. Nasal washings and aspirates are unacceptable for Xpert Xpress SARS-CoV-2/FLU/RSV testing.  Fact Sheet for Patients: EntrepreneurPulse.com.au  Fact Sheet for Healthcare Providers: IncredibleEmployment.be  This test is not yet approved or cleared by the [place] FDA and has been authorized for detection and/or diagnosis of SARS-CoV-2 by FDA under an Emergency Use Authorization (EUA). This EUA will remain in effect (meaning this test can be used) for the duration of the COVID-19 declaration under Section 564(b)(1) of the Act, 21 U.S.C. section 360bbb-3(b)(1), unless the authorization is terminated or revoked.  Performed at [facility], [place] [address] [place], [place] [address]   Respiratory (~20 pathogens) panel by PCR     Status: None   Collection Time: 05/02/20  1:01 PM   Specimen:  Nasopharyngeal Swab; Respiratory  Result Value Ref Range Status   Adenovirus NOT DETECTED NOT DETECTED Final   Coronavirus 229E NOT DETECTED NOT DETECTED Final    Comment: (NOTE) The Coronavirus on the Respiratory Panel, DOES NOT test for the novel  Coronavirus (2019 nCoV)    Coronavirus HKU1 NOT DETECTED NOT DETECTED Final   Coronavirus NL63 NOT DETECTED NOT DETECTED Final   Coronavirus OC43 NOT DETECTED NOT DETECTED Final   Metapneumovirus NOT DETECTED NOT DETECTED Final   Rhinovirus / Enterovirus NOT DETECTED NOT DETECTED Final   Influenza A NOT DETECTED NOT DETECTED Final   Influenza B NOT DETECTED NOT DETECTED Final   Parainfluenza Virus 1 NOT DETECTED NOT DETECTED Final   Parainfluenza Virus 2 NOT DETECTED NOT DETECTED Final   Parainfluenza Virus 3 NOT DETECTED NOT DETECTED Final   Parainfluenza Virus 4 NOT DETECTED NOT DETECTED Final  Respiratory Syncytial Virus NOT DETECTED NOT DETECTED Final   Bordetella pertussis NOT DETECTED NOT DETECTED Final   Bordetella Parapertussis NOT DETECTED NOT DETECTED Final   Chlamydophila pneumoniae NOT DETECTED NOT DETECTED Final   Mycoplasma pneumoniae NOT DETECTED NOT DETECTED Final    Comment: Performed at [facility], [place] [address] [place], [place] [address]  Culture, blood (routine x 2)     Status: None   Collection Time: 05/02/20  2:15 PM   Specimen: BLOOD  Result Value Ref Range Status   Specimen Description BLOOD LEFT ANTECUBITAL  Final   Special Requests   Final    BOTTLES DRAWN AEROBIC AND ANAEROBIC Blood Culture results may not be optimal due to an inadequate volume of blood received in culture bottles   Culture   Final    NO GROWTH 5 DAYS Performed at [facility], [place] [address] [place], [place] [address]    Report Status 05/07/2020 FINAL  Final  Culture, blood (routine x 2)     Status: None   Collection Time: 05/02/20  2:31 PM   Specimen: BLOOD LEFT HAND  Result Value Ref Range Status   Specimen  Description BLOOD LEFT HAND  Final   Special Requests   Final    BOTTLES DRAWN AEROBIC ONLY Blood Culture adequate volume   Culture   Final    NO GROWTH 5 DAYS Performed at [facility], [place] [address], [place], [place] [address]    Report Status 05/07/2020 FINAL  Final     Time coordinating discharge in minutes: 65  SIGNED:   Debbe Odea, MD  [facility] 05/11/2020, 12:51 PM

## 2020-05-18 ENCOUNTER — Other Ambulatory Visit (INDEPENDENT_AMBULATORY_CARE_PROVIDER_SITE_OTHER): Payer: Self-pay | Admitting: Primary Care

## 2020-05-18 DIAGNOSIS — N3281 Overactive bladder: Secondary | ICD-10-CM

## 2020-05-25 NOTE — Progress Notes (Incomplete)
HEMATOLOGY/ONCOLOGY CONSULTATION NOTE  Date of Service: 05/25/2020  Patient Care Team: Kerin Perna, NP as PCP - General (Internal Medicine) Troy Sine, MD as PCP - Cardiology (Cardiology) Bryn Gulling as Physician Assistant (Orthopedic Surgery) Haddix, Thomasene Lot, MD as Consulting Physician (Orthopedic Surgery)  CHIEF COMPLAINTS/PURPOSE OF CONSULTATION:  CMML Thrombocytopenia  HISTORY OF PRESENTING ILLNESS:   Kathy Irwin is a wonderful 70 y.o. 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 The pt reports that she is doing well overall.  The pt reports that she was diagnosed with CML by [facility] [place] six years ago. 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 They also did a BM Bx which showed that she had chronic leukemia but she is unsure what kind. She has not needed any treatments in 6 years and was only monitored with labs. Her RBC and WBC have remained within the normal range since her diagnosis. Previously her PLTs have gotten as low as 30K but have come back up on their own. She denies any pain or issues related to her leukemia within the last 6 years.   Pt had a recent [place] infection in August. She went back to the hospital on 09/07 with bacterial Pneumonia and was discharged on 09/10. Pt notes that her breathing has improved since that hospitalization but is not back to baseline. She was sent home with oxygen and has been using it. Pt has been using oxygen for about 8 years and she is not sure why it was given to her. Pt was previously diagnosed with Interstitial Lung Disease. She says that when her symptoms worsen she is given steroids for 6 weeks. She is currently on a course of Prednisone. Pt has had sleep apnea testing which revealed that she does not have sleep apnea. Her HTN and Diabetes have been well controlled and she has been taking her medications as prescribed. She also has  home care who has been helping her walk and get some exercise. She will be going to see Dr. Ina Homes, a Pulmonologist, on 10/08.   Most recent lab results (01/09/2019) of CBC w/diff & CMP is as follows: all values are WNL except for RDW at 18.0, PLTs at 117K, nRBC Rel at 1, Glucose at 118.   On review of systems, pt denies back pain, abdominal pain and any other symptoms.   On PMHx the pt reports ILD, HTN, Diabetes, COVID19 infection, bacterial Pneumonia. On Social Hx the pt reports she is a non-smoker and does not drink alcohol  INTERVAL HISTORY:   Kathy Irwin is a wonderful 70 y.o. 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 The patient's last visit with [place] was on 01/21/2020. The pt reports that she is doing well overall.  The pt reports ***  Lab results today 05/26/2020 of CBC w/diff and CMP is as follows: all values are WNL except for ***  On review of systems, pt reports *** and denies *** and any other symptoms.  MEDICAL HISTORY:  Past Medical History:  Diagnosis Date  . Acute respiratory failure with hypoxia ([place]) 12/2018  . Diabetes mellitus without complication ([place])   . 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 Hypertension   . ILD (interstitial lung disease) ([employer]) 12/24/2018    SURGICAL HISTORY: Past Surgical History:  Procedure Laterality Date  . ABDOMINAL HYSTERECTOMY    . INTRAMEDULLARY (IM) NAIL INTERTROCHANTERIC  Right 04/30/2020   Procedure: INTRAMEDULLARY (IM) NAIL INTERTROCHANTRIC;  Surgeon: Shona Needles, MD;  Location: [facility];  Service: Orthopedics;  Laterality: Right;  Marland Kitchen VIDEO BRONCHOSCOPY Bilateral 04/02/2019   Procedure: VIDEO BRONCHOSCOPY WITH FLUORO;  Surgeon: Marshell Garfinkel, MD;  Location: [facility];  Service: Cardiopulmonary;  Laterality: Bilateral;    SOCIAL HISTORY: Social History   Socioeconomic History  . Marital status: Married    Spouse name: Not on file  . Number of children: Not on file  .  Years of education: Not on file  . Highest education level: Not on file  Occupational History  . Not on file  Tobacco Use  . Smoking status: Never Smoker  . Smokeless tobacco: Never Used  Vaping Use  . Vaping Use: Never used  Substance and Sexual Activity  . Alcohol use: Never  . Drug use: Never  . Sexual activity: Not on file  Other Topics Concern  . Not on file  Social History Narrative  . Not on file   Social Determinants of Health   Financial Resource Strain: Not on file  Food Insecurity: Not on file  Transportation Needs: Not on file  Physical Activity: Not on file  Stress: Not on file  Social Connections: Not on file  Intimate Partner Violence: Not on file    FAMILY HISTORY: Family History  Problem Relation Age of Onset  . Diabetes Other   . Hypertension Other     ALLERGIES:  is allergic to other, ativan [lorazepam], iodine, merbromin, and tape.  MEDICATIONS:  Current Outpatient Medications  Medication Sig Dispense Refill  . acetaminophen (TYLENOL) 500 MG tablet Take 1,000 mg by mouth at bedtime.    Marland Kitchen albuterol (VENTOLIN HFA) 108 (90 Base) MCG/ACT inhaler INHALE 1-2 PUFFS BY MOUTH EVERY 6 HOURS AS NEEDED FOR WHEEZE OR SHORTNESS OF BREATH 8.5 each 1  . aspirin EC 81 MG EC tablet Take 1 tablet (81 mg total) by mouth daily. Swallow whole. 30 tablet 0  . blood glucose meter kit and supplies KIT Dispense based on patient and insurance preference. Use up to four times daily as directed. (FOR ICD-9 250.00, 250.01). For QAC - HS accuchecks. 1 each 1  . blood glucose meter kit and supplies Dispense based on patient and insurance preference. Use up to four times daily as directed. (FOR ICD-10 E10.9, E11.9). 1 each 0  . cetirizine (ZYRTEC ALLERGY) 10 MG tablet Take 1 tablet (10 mg total) by mouth daily for 7 days. (Patient not taking: Reported on 04/29/2020) 20 tablet 0  . [employer] D3 125 MCG (5000 UT) capsule Take 5,000 Units by mouth daily.    . Ensure Max Protein (ENSURE MAX  PROTEIN) LIQD Take 330 mLs (11 oz total) by mouth 3 (three) times daily.    . famotidine (PEPCID) 40 MG tablet Take 1 tablet (40 mg total) by mouth daily.    . fluticasone (FLONASE) 50 MCG/ACT nasal spray Place 2 sprays into both nostrils daily as needed for allergies or rhinitis. 18.2 mL 3  . furosemide (LASIX) 20 MG tablet Take 1 pill in the morning (Patient taking differently: Take 20 mg by mouth daily.) 90 tablet 1  . glimepiride (AMARYL) 4 MG tablet Take 4 mg by mouth daily.    Marland Kitchen glucose blood (FREESTYLE LITE) test strip For glucose testing every before meals at bedtime. Diagnosis E 11.65  Can substitute to any accepted brand 100 each 0  . linaclotide (LINZESS) 145 MCG CAPS capsule Take 1 capsule (145 mcg  total) by mouth daily before breakfast.    . magnesium gluconate (MAGONATE) 30 MG tablet Take 1 tablet (30 mg total) by mouth 2 (two) times daily. (Patient taking differently: Take 30 mg by mouth daily.) 180 tablet 1  . metFORMIN (GLUCOPHAGE) 1000 MG tablet Take 1 tablet (1,000 mg total) by mouth 2 (two) times daily with a meal. (Patient taking differently: Take 1,000 mg by mouth daily with breakfast.) 180 tablet 3  . methocarbamol (ROBAXIN) 500 MG tablet Take 1 tablet (500 mg total) by mouth every 8 (eight) hours as needed for muscle spasms. 30 tablet 1  . metoprolol succinate (TOPROL-XL) 25 MG 24 hr tablet Take 1 tablet (25 mg total) by mouth daily. (Patient taking differently: Take 25 mg by mouth See admin instructions. Take 1 tablet (25 mg) combine with 1 tablet (50 mg) to make 75 mg totally by mouth every day) 90 tablet 1  . metoprolol succinate (TOPROL-XL) 50 MG 24 hr tablet TAKE 1 TABLET BY MOUTH EVERY DAY IN THE MORNING (Patient taking differently: Take 50 mg by mouth See admin instructions. Take 1 tablet (50 mg) combine with 1 tablet (25 mg) to make 75 mg totally by mouth every day) 30 tablet 1  . montelukast (SINGULAIR) 10 MG tablet Take 10 mg by mouth at bedtime.    . Multiple Vitamin  (MULTIVITAMIN WITH MINERALS) TABS tablet Take 1 tablet by mouth daily.    Marland Kitchen olopatadine (PATANOL) 0.1 % ophthalmic solution Place 1 drop into both eyes 2 (two) times daily. (Patient taking differently: Place 1 drop into both eyes 2 (two) times daily as needed for allergies (itchy eyes).) 5 mL 0  . ondansetron (ZOFRAN) 4 MG tablet Take 1 tablet (4 mg total) by mouth every 8 (eight) hours as needed for nausea or vomiting. 12 tablet 0  . oxybutynin (DITROPAN) 5 MG tablet TAKE 1 TABLET BY MOUTH THREE TIMES A DAY 90 tablet 0  . pantoprazole (PROTONIX) 40 MG tablet Take 1 tablet (40 mg total) by mouth daily. 60 tablet 0  . polyethylene glycol (MIRALAX / GLYCOLAX) 17 g packet Take 17 g by mouth daily as needed for mild constipation or moderate constipation.  0  . pravastatin (PRAVACHOL) 40 MG tablet Take 1 tablet (40 mg total) by mouth at bedtime. 90 tablet 1  . pregabalin (LYRICA) 100 MG capsule Take 1 capsule (100 mg total) by mouth at bedtime. 90 capsule 0  . senna-docusate (SENOKOT-S) 8.6-50 MG tablet Take 1 tablet by mouth 2 (two) times daily.    . sertraline (ZOLOFT) 100 MG tablet Take 1 pill at bedtime (Patient not taking: No sig reported) 90 tablet 1  . simethicone (MYLICON) 80 MG chewable tablet Chew 1 tablet (80 mg total) by mouth 4 (four) times daily as needed for flatulence.    . sodium chloride (OCEAN) 0.65 % SOLN nasal spray Place 1 spray into both nostrils as needed for congestion. 15 mL 0  . tamsulosin (FLOMAX) 0.4 MG CAPS capsule Take 1 capsule (0.4 mg total) by mouth daily. 90 capsule 1  . traMADol (ULTRAM) 50 MG tablet Take 1 tablet (50 mg total) by mouth every 6 (six) hours as needed for severe pain. 20 tablet 0   No current facility-administered medications for this visit.    10 Point review of Systems was done is negative except as noted above.  PHYSICAL EXAMINATION: ECOG PERFORMANCE STATUS: 2 - Symptomatic, <50% confined to bed  . There were no vitals filed for this  visit.  There were no vitals filed for this visit. .There is no height or weight on file to calculate BMI.   *** GENERAL:alert, in no acute distress and comfortable SKIN: no acute rashes, no significant lesions EYES: conjunctiva are pink and non-injected, sclera anicteric OROPHARYNX: MMM, no exudates, no oropharyngeal erythema or ulceration NECK: supple, no JVD LYMPH:  no palpable lymphadenopathy in the cervical, axillary or inguinal regions LUNGS: clear to auscultation b/l with normal respiratory effort HEART: regular rate & rhythm ABDOMEN:  normoactive bowel sounds , non tender, not distended. Extremity: no pedal edema PSYCH: alert & oriented x 3 with fluent speech NEURO: no focal motor/sensory deficits    LABORATORY DATA:  I have reviewed the data as listed  . CBC Latest Ref Rng & Units 05/11/2020 05/10/2020 05/09/2020  WBC 4.0 - 10.5 K/uL 12.7(H) 13.0(H) 15.6(H)  Hemoglobin 12.0 - 15.0 g/dL 8.1(L) 8.4(L) 8.4(L)  Hematocrit 36.0 - 46.0 % 26.0(L) 26.6(L) 26.2(L)  Platelets 150 - 400 K/uL 61(L) 55(L) 48(L)    . CMP Latest Ref Rng & Units 05/10/2020 05/09/2020 05/08/2020  Glucose 70 - 99 mg/dL 296(H) 193(H) 133(H)  BUN 8 - 23 mg/dL _0 Creatinine 0.44 - 1.00 mg/dL 0.70 0.60 0.49  Sodium 135 - 145 mmol/L 135 135 135  Potassium 3.5 - 5.1 mmol/L 4.7 4.7 4.2  Chloride 98 - 111 mmol/L 100 100 102  CO2 22 - 32 mmol/L _1 Calcium 8.9 - 10.3 mg/dL 9.6 9.3 8.8(L)  Total Protein 6.5 - 8.1 g/dL - - -  Total Bilirubin 0.3 - 1.2 mg/dL - - -  Alkaline Phos 38 - 126 U/L - - -  AST 15 - 41 U/L - - -  ALT 0 - 44 U/L - - -   01/21/2019 FISH:    RADIOGRAPHIC STUDIES: I have personally reviewed the radiological images as listed and agreed with the findings in the report. CT Code Stroke CTA Head W/WO contrast  Result Date: 05/02/2020 CLINICAL DATA:  70 year old female code stroke presentation. Altered mental status. Recent right femur ORIF. EXAM: CT ANGIOGRAPHY HEAD AND NECK  TECHNIQUE: Multidetector CT imaging of the head and neck was performed using the standard protocol during bolus administration of intravenous contrast. Multiplanar CT image reconstructions and MIPs were obtained to evaluate the vascular anatomy. Carotid stenosis measurements (when applicable) are obtained utilizing NASCET criteria, using the distal internal carotid diameter as the denominator. CONTRAST:  159m OMNIPAQUE IOHEXOL 350 MG/ML SOLN COMPARISON:  Plain head CT 1123 hours today. CTA Chest today reported separately. FINDINGS: CTA NECK Skeleton: Bulky disc and endplate degeneration throughout the cervical spine. No acute osseous abnormality identified. Upper chest: Patchy angiographic bilateral upper lung opacity. See comparison. Other neck: Negative thyroid for age. Small volume retained secretions in the pharynx. Paranasal sinuses as described on the head CT today. No other acute finding. Aortic arch: Mild to moderate distal arch atherosclerosis. Three vessel arch configuration. Right carotid system: Mildly tortuous brachiocephalic artery and proximal right CCA without stenosis. Mild calcified plaque at the posterior right ICA origin without stenosis. Tortuous right ICA. Left carotid system: Tortuous proximal left CCA without stenosis. Mild soft plaque suspected at the level of the thyroid, no stenosis. Similar mild soft plaque at the left carotid bifurcation, left ICA origin. No stenosis. Mildly tortuous left ICA. Vertebral arteries: Tortuous proximal right subclavian artery with mild calcified plaque, no stenosis. Right vertebral artery origin is within normal limits, detail of the right V1 segment is suboptimal. But the  right vertebral is patent elsewhere in the neck and to the skull base without stenosis. Mildly tortuous proximal left subclavian artery with mild calcified plaque and no stenosis. Normal left vertebral artery origin. Mildly tortuous left V1 segment. Similar suboptimal vertebral artery  detail as on the right, but the left vertebral remains patent to the skull base with no convincing stenosis. CTA HEAD Posterior circulation: Patent vertebral V4 segments without significant stenosis, the right is dominant. Left PICA origin is patent. Right AICA appears dominant and patent. Patent basilar artery with mild irregularity but no stenosis. Patent SCA origins. Fetal type bilateral PCA origins. Bilateral PCA branches are within normal limits. Anterior circulation: Both ICA siphons are patent. Mild for age bilateral siphon calcified plaque. No siphon stenosis. Normal posterior communicating artery origins. Patent carotid termini. Patent MCA and ACA origins. Anterior communicating artery and bilateral ACA branches are within normal limits. Left MCA M1 segment and bifurcation are patent without stenosis. Right MCA M1 segment and bifurcation are patent without stenosis. Bilateral MCA branches are within normal limits. Venous sinuses: Patent. Anatomic variants: Fetal type bilateral PCA origins. Subsequent diminutive vertebrobasilar system. Review of the MIP images confirms the above findings IMPRESSION: 1. Negative for large vessel occlusion. 2. 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 No hemodynamically significant arterial stenosis identified. 3. Patchy bilateral upper lung opacity suggestive of acute viral/atypical respiratory infection. See CTA chest reported separately. These results were communicated to Dr. Rory Percy at 12:01 pm on 05/02/2020 by text page via the [facility] messaging system. Electronically Signed   By: Genevie Ann M.D.   On: 05/02/2020 12:02   DG Chest 1 View  Result Date: 04/29/2020 CLINICAL DATA:  Pain following fall EXAM: CHEST  1 VIEW COMPARISON:  October 15, 2019 FINDINGS: Lungs are clear. Heart size and pulmonary vascularity are normal. No pneumothorax. No adenopathy. No bone lesions. IMPRESSION: Lungs clear.  Cardiac silhouette normal. Electronically Signed   By: Lowella Grip III  M.D.   On: 04/29/2020 12:03   CT Code Stroke CTA Neck W/WO contrast  Result Date: 05/02/2020 CLINICAL DATA:  70 year old female code stroke presentation. Altered mental status. Recent right femur ORIF. EXAM: CT ANGIOGRAPHY HEAD AND NECK TECHNIQUE: Multidetector CT imaging of the head and neck was performed using the standard protocol during bolus administration of intravenous contrast. Multiplanar CT image reconstructions and MIPs were obtained to evaluate the vascular anatomy. Carotid stenosis measurements (when applicable) are obtained utilizing NASCET criteria, using the distal internal carotid diameter as the denominator. CONTRAST:  174m OMNIPAQUE IOHEXOL 350 MG/ML SOLN COMPARISON:  Plain head CT 1123 hours today. CTA Chest today reported separately. FINDINGS: CTA NECK Skeleton: Bulky disc and endplate degeneration throughout the cervical spine. No acute osseous abnormality identified. Upper chest: Patchy angiographic bilateral upper lung opacity. See comparison. Other neck: Negative thyroid for age. Small volume retained secretions in the pharynx. Paranasal sinuses as described on the head CT today. No other acute finding. Aortic arch: Mild to moderate distal arch atherosclerosis. Three vessel arch configuration. Right carotid system: Mildly tortuous brachiocephalic artery and proximal right CCA without stenosis. Mild calcified plaque at the posterior right ICA origin without stenosis. Tortuous right ICA. Left carotid system: Tortuous proximal left CCA without stenosis. Mild soft plaque suspected at the level of the thyroid, no stenosis. Similar mild soft plaque at the left carotid bifurcation, left ICA origin. No stenosis. Mildly tortuous left ICA. Vertebral arteries: Tortuous proximal right subclavian artery with mild calcified plaque, no stenosis. Right vertebral artery  origin is within normal limits, detail of the right V1 segment is suboptimal. But the right vertebral is patent elsewhere in the neck  and to the skull base without stenosis. Mildly tortuous proximal left subclavian artery with mild calcified plaque and no stenosis. Normal left vertebral artery origin. Mildly tortuous left V1 segment. Similar suboptimal vertebral artery detail as on the right, but the left vertebral remains patent to the skull base with no convincing stenosis. CTA HEAD Posterior circulation: Patent vertebral V4 segments without significant stenosis, the right is dominant. Left PICA origin is patent. Right AICA appears dominant and patent. Patent basilar artery with mild irregularity but no stenosis. Patent SCA origins. Fetal type bilateral PCA origins. Bilateral PCA branches are within normal limits. Anterior circulation: Both ICA siphons are patent. Mild for age bilateral siphon calcified plaque. No siphon stenosis. Normal posterior communicating artery origins. Patent carotid termini. Patent MCA and ACA origins. Anterior communicating artery and bilateral ACA branches are within normal limits. Left MCA M1 segment and bifurcation are patent without stenosis. Right MCA M1 segment and bifurcation are patent without stenosis. Bilateral MCA branches are within normal limits. Venous sinuses: Patent. Anatomic variants: Fetal type bilateral PCA origins. Subsequent diminutive vertebrobasilar system. Review of the MIP images confirms the above findings IMPRESSION: 1. Negative for large vessel occlusion. 2. 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 No hemodynamically significant arterial stenosis identified. 3. Patchy bilateral upper lung opacity suggestive of acute viral/atypical respiratory infection. See CTA chest reported separately. These results were communicated to Dr. Rory Percy at 12:01 pm on 05/02/2020 by text page via the [facility] messaging system. Electronically Signed   By: Genevie Ann M.D.   On: 05/02/2020 12:02   CT ANGIO CHEST PE W OR WO CONTRAST  Result Date: 05/02/2020 CLINICAL DATA:  High probability of pulmonary embolus.  EXAM: CT ANGIOGRAPHY CHEST WITH CONTRAST TECHNIQUE: Multidetector CT imaging of the chest was performed using the standard protocol during bolus administration of intravenous contrast. Multiplanar CT image reconstructions and MIPs were obtained to evaluate the vascular anatomy. CONTRAST:  160m OMNIPAQUE IOHEXOL 350 MG/ML SOLN COMPARISON:  August 15, 2019. FINDINGS: Cardiovascular: There is no evidence of large central pulmonary embolus seen in the main pulmonary artery or main portions of the left and right pulmonary arteries. Evaluation of the lower lobe branches of both pulmonary arteries is limited due to respiratory motion artifact and attenuation due to body habitus. Smaller and more peripheral pulmonary emboli cannot be excluded on the basis of this exam. Normal cardiac size. No pericardial effusion. No evidence of thoracic aortic dissection or aneurysm. Mediastinum/Nodes: No enlarged mediastinal, hilar, or axillary lymph nodes. Thyroid gland, trachea, and esophagus demonstrate no significant findings. Lungs/Pleura: No pneumothorax or pleural effusion is noted. Patchy airspace opacities are noted throughout both lungs which may represent multifocal pneumonia. Upper Abdomen: No acute abnormality. Musculoskeletal: No chest wall abnormality. No acute or significant osseous findings. Review of the MIP images confirms the above findings. IMPRESSION: 1. There is no evidence of large central pulmonary embolus seen in the main pulmonary artery or main portions of the left and right pulmonary arteries. Evaluation of the lower lobe branches of both pulmonary arteries is limited due to respiratory motion artifact and attenuation due to body habitus. Smaller and more peripheral pulmonary emboli cannot be excluded on the basis of this exam. 2. Patchy airspace opacities are noted throughout both lungs which may represent multifocal pneumonia. Electronically Signed   By: JBobbe MedicoD.  On: 05/02/2020 12:09   DG  Chest Port 1 View  Result Date: 05/10/2020 CLINICAL DATA:  Shortness of breath Pneumonia Hypoxia EXAM: PORTABLE CHEST 1 VIEW COMPARISON:  05/05/2020 FINDINGS: Heart size is within normal limits. No significant pulmonary vascular congestion. Interval improvement in bilateral airspace opacities with mild interstitial opacities still remaining in the mid and lower lungs. IMPRESSION: Interval improvement in aeration of the lungs, most likely due to decreasing pulmonary edema or pneumonia. Electronically Signed   By: Miachel Roux M.D.   On: 05/10/2020 07:46   DG CHEST PORT 1 VIEW  Result Date: 05/05/2020 CLINICAL DATA:  Shortness of breath. EXAM: PORTABLE CHEST 1 VIEW COMPARISON:  05/03/2020. FINDINGS: Cardiomegaly. Diffuse bilateral pulmonary interstitial edema/infiltrates, progressed from prior exam. No pleural effusion or pneumothorax. No acute bony abnormality. IMPRESSION: 1. Cardiomegaly. 2. Diffuse bilateral pulmonary interstitial edema/infiltrates, progressed from prior exam. Electronically Signed   By: Marcello Moores  Register   On: 05/05/2020 06:25   DG CHEST PORT 1 VIEW  Result Date: 05/03/2020 CLINICAL DATA:  70 year old female with shortness of breath. Confusion, altered mental status. Recent hip fracture, surgery. Negative for COVID-19 yesterday and on 04/29/2020. EXAM: PORTABLE CHEST 1 VIEW COMPARISON:  CTA chest yesterday and earlier. FINDINGS: Portable AP semi upright view at 0933 hours. New since portable chest x-ray 04/29/2020 multifocal bilateral patchy and indistinct pulmonary opacities. Lung apices appear relatively spared. Both lower lungs affected. Mildly lower lung volumes. Stable cardiac size and mediastinal contours. Visualized tracheal air column is within normal limits. No pneumothorax or pleural effusion. No air bronchograms. No acute osseous abnormality identified. IMPRESSION: Multifocal bilateral patchy and indistinct pulmonary opacity is new compared to 04/29/2020, and compatible with  Acute Pneumonia superimposed on chronic interstitial lung disease (which was demonstrated on Chest CTs 2021 and earlier). Electronically Signed   By: Genevie Ann M.D.   On: 05/03/2020 09:43   DG C-Arm 1-60 Min  Result Date: 04/30/2020 CLINICAL DATA:  Trauma.  Intramedullary nail right femur. EXAM: RIGHT FEMUR 2 VIEWS; DG C-ARM 1-60 MIN COMPARISON:  Radiographs April 29, 2020. FINDINGS: Fluoro time: 6 minutes and 38 seconds. Reported radiation: 166.89 mGy. Eleven C-arm fluoroscopic images were obtained intraoperatively and submitted for post operative interpretation. These images demonstrate sequential changes associated with right intramedullary nail and screw fixation of a intertrochanteric femur fracture. Final images demonstrate improved, near anatomic alignment without unexpected findings. Please see the performing provider's procedural report for further detail. IMPRESSION: Intraoperative fluoroscopic imaging, as detailed above. Electronically Signed   By: Margaretha Sheffield MD   On: 04/30/2020 15:16   DG Hip Unilat W or Wo Pelvis 2-3 Views Right  Result Date: 04/29/2020 CLINICAL DATA:  Pain following fall EXAM: DG HIP (WITH OR WITHOUT PELVIS) 2-3V RIGHT COMPARISON:  None. FINDINGS: Frontal pelvis as well as frontal and lateral right hip images were obtained. There is a comminuted intertrochanteric femur fracture on the right with impaction at the fracture site and varus angulation. There is avulsion of the greater trochanter on the right. No other fractures. No dislocation. There is no appreciable joint space narrowing. There is bony overgrowth along the superolateral aspect of each acetabulum. There is degenerative change in the lower lumbar spine. IMPRESSION: Comminuted intertrochanteric femur fracture on the right with impaction and varus angulation at the fracture site as well as avulsion of the greater trochanter. No other fracture. No dislocation. Osteoarthritic change noted in the lower lumbar  spine. Bony overgrowth along the superolateral aspect of each acetabulum, a finding potentially placing patient  at increased risk for femoroacetabular impingement. Electronically Signed   By: Lowella Grip III M.D.   On: 04/29/2020 12:00   DG FEMUR, MIN 2 VIEWS RIGHT  Result Date: 04/30/2020 CLINICAL DATA:  Trauma.  Intramedullary nail right femur. EXAM: RIGHT FEMUR 2 VIEWS; DG C-ARM 1-60 MIN COMPARISON:  Radiographs April 29, 2020. FINDINGS: Fluoro time: 6 minutes and 38 seconds. Reported radiation: 166.89 mGy. Eleven C-arm fluoroscopic images were obtained intraoperatively and submitted for post operative interpretation. These images demonstrate sequential changes associated with right intramedullary nail and screw fixation of a intertrochanteric femur fracture. Final images demonstrate improved, near anatomic alignment without unexpected findings. Please see the performing provider's procedural report for further detail. IMPRESSION: Intraoperative fluoroscopic imaging, as detailed above. Electronically Signed   By: Margaretha Sheffield MD   On: 04/30/2020 15:16   DG FEMUR PORT, MIN 2 VIEWS RIGHT  Result Date: 04/30/2020 CLINICAL DATA:  Right hip ORIF, postoperative assessment EXAM: RIGHT FEMUR PORTABLE 2 VIEW COMPARISON:  02/28/2021 FINDINGS: Right intertrochanteric fracture ORIF noted with intramedullary nail component with distal interlocking screws, a long threaded axial femoral neck screw component, and a distal threaded femoral neck screw component. The patient has a known greater trochanteric fragment which is better aligned than on the preoperative radiographs. No new fracture or new complicating feature. Mild spurring of the right acetabulum noted. 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 IMPRESSION: Right intertrochanteric fracture ORIF without complicating feature identified. Electronically Signed   By: Van Clines M.D.   On: 04/30/2020 16:47   CT HEAD CODE STROKE WO CONTRAST  Result  Date: 05/02/2020 CLINICAL DATA:  Code stroke. 70 year old female altered mental status. EXAM: CT HEAD WITHOUT CONTRAST TECHNIQUE: Contiguous axial images were obtained from the base of the skull through the vertex without intravenous contrast. COMPARISON:  Head CT 11/09/2019. FINDINGS: Brain: Stable dystrophic calcifications bilateral basal ganglia. No midline shift, ventriculomegaly, mass effect, evidence of mass lesion, intracranial hemorrhage or evidence of cortically based acute infarction. Gray-white matter differentiation is within normal limits throughout the brain. Vascular: Calcified atherosclerosis at the skull base. No suspicious intracranial vascular hyperdensity. Skull: No acute osseous abnormality identified. Sinuses/Orbits: Generalized mild to moderate paranasal sinus mucosal thickening is new. No sinus fluid levels. Tympanic cavities and mastoids remain clear. Other: Visualized orbits and scalp soft tissues are within normal limits. ASPECTS [facility] Stroke Program Early CT Score) Total score (0-10 with 10 being normal): 10 IMPRESSION: 1. Stable and normal for age noncontrast CT appearance of the brain. ASPECTS 10. 2. New generalized paranasal sinus inflammation. 3. These results were communicated to Dr. Rory Percy at 11:35 am on 05/02/2020 by text page via the [facility] messaging system. Electronically Signed   By: Genevie Ann M.D.   On: 05/02/2020 11:36   VAS [place] LOWER EXTREMITY VENOUS (DVT)  Result Date: 05/04/2020  Lower Venous DVT Study Indications: Edema.  Limitations: Poor ultrasound/tissue interface and body habitus. Comparison Study: 11/09/19 previous Performing Technologist: Abram Sander RVS  Examination Guidelines: A complete evaluation includes B-mode imaging, spectral Doppler, color Doppler, and power Doppler as needed of all accessible portions of each vessel. Bilateral testing is considered an integral part of a complete examination. Limited examinations for reoccurring indications may be performed  as noted. The reflux portion of the exam is performed with the patient in reverse Trendelenburg.  +---------+---------------+---------+-----------+----------+--------------+ RIGHT    CompressibilityPhasicitySpontaneityPropertiesThrombus Aging +---------+---------------+---------+-----------+----------+--------------+ CFV      Full           Yes      Yes                                 +---------+---------------+---------+-----------+----------+--------------+  SFJ      Full                                                        +---------+---------------+---------+-----------+----------+--------------+ FV Prox  Full                                                        +---------+---------------+---------+-----------+----------+--------------+ FV Mid                  Yes      Yes                                 +---------+---------------+---------+-----------+----------+--------------+ FV Distal               Yes      Yes                                 +---------+---------------+---------+-----------+----------+--------------+ PFV      Full                                                        +---------+---------------+---------+-----------+----------+--------------+ POP      Full           Yes      Yes                                 +---------+---------------+---------+-----------+----------+--------------+ PTV      Full                                                        +---------+---------------+---------+-----------+----------+--------------+ PERO     Full                                                        +---------+---------------+---------+-----------+----------+--------------+   +---------+---------------+---------+-----------+----------+--------------+ LEFT     CompressibilityPhasicitySpontaneityPropertiesThrombus Aging +---------+---------------+---------+-----------+----------+--------------+ CFV      Full            Yes      Yes                                 +---------+---------------+---------+-----------+----------+--------------+ SFJ      Full                                                        +---------+---------------+---------+-----------+----------+--------------+  FV Prox  Full                                                        +---------+---------------+---------+-----------+----------+--------------+ FV Mid                  Yes      Yes                                 +---------+---------------+---------+-----------+----------+--------------+ FV Distal               Yes      Yes                                 +---------+---------------+---------+-----------+----------+--------------+ PFV      Full                                                        +---------+---------------+---------+-----------+----------+--------------+ POP      Full           Yes      Yes                                 +---------+---------------+---------+-----------+----------+--------------+ PTV      Full                                                        +---------+---------------+---------+-----------+----------+--------------+ PERO     Full                                                        +---------+---------------+---------+-----------+----------+--------------+     Summary: BILATERAL: - No evidence of deep vein thrombosis seen in the lower extremities, bilaterally. - No evidence of superficial venous thrombosis in the lower extremities, bilaterally. -No evidence of popliteal cyst, bilaterally.   *See table(s) above for measurements and observations. Electronically signed by Deitra Mayo MD on 05/04/2020 at 1:40:27 PM.    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 ITP is like since platelets improved significantly with recent  steroid use.  PLAN:  - Discussed pt labwork 05/26/2020; ***  -Will see back ***    FOLLOW UP: ***   The total time spent in the appointment was *** minutes and more than 50% was on counseling and direct patient cares.  All of the patient's questions were answered with apparent satisfaction. The patient knows to call the clinic with any problems, questions or concerns.   Sullivan Lone MD MS AAHIVMS [place]  Hematology/Oncology Physician [facility]  (Office):       [phone] (Work cell):  [phone] (Fax):           [phone]  05/25/2020 6:13 PM  I, Reinaldo Raddle, am acting as scribe for Dr. Sullivan Lone, MD.

## 2020-05-26 ENCOUNTER — Inpatient Hospital Stay: Payer: 59 | Attending: Hematology

## 2020-05-26 ENCOUNTER — Inpatient Hospital Stay: Payer: 59 | Admitting: Hematology

## 2020-06-01 ENCOUNTER — Other Ambulatory Visit (INDEPENDENT_AMBULATORY_CARE_PROVIDER_SITE_OTHER): Payer: Self-pay | Admitting: Primary Care

## 2020-06-01 DIAGNOSIS — N3281 Overactive bladder: Secondary | ICD-10-CM

## 2020-06-23 ENCOUNTER — Telehealth: Payer: Self-pay | Admitting: *Deleted

## 2020-06-23 ENCOUNTER — Encounter (HOSPITAL_COMMUNITY): Payer: Self-pay

## 2020-06-23 ENCOUNTER — Emergency Department (HOSPITAL_COMMUNITY)
Admission: EM | Admit: 2020-06-23 | Discharge: 2020-06-24 | Disposition: A | Discharge status: Home / Self Care | Payer: 59 | Attending: Emergency Medicine | Admitting: Emergency Medicine

## 2020-06-23 ENCOUNTER — Other Ambulatory Visit: Payer: Self-pay

## 2020-06-23 ENCOUNTER — Emergency Department (HOSPITAL_COMMUNITY): Payer: 59

## 2020-06-23 DIAGNOSIS — E119 Type 2 diabetes mellitus without complications: Secondary | ICD-10-CM | POA: Diagnosis not present

## 2020-06-23 DIAGNOSIS — I5032 Chronic diastolic (congestive) heart failure: Secondary | ICD-10-CM | POA: Insufficient documentation

## 2020-06-23 DIAGNOSIS — R5383 Other fatigue: Secondary | ICD-10-CM | POA: Diagnosis present

## 2020-06-23 DIAGNOSIS — Z79899 Other long term (current) drug therapy: Secondary | ICD-10-CM | POA: Insufficient documentation

## 2020-06-23 DIAGNOSIS — I110 Hypertensive heart disease with heart failure: Secondary | ICD-10-CM | POA: Insufficient documentation

## 2020-06-23 DIAGNOSIS — S72001S Fracture of unspecified part of neck of right femur, sequela: Secondary | ICD-10-CM

## 2020-06-23 DIAGNOSIS — D649 Anemia, unspecified: Secondary | ICD-10-CM | POA: Insufficient documentation

## 2020-06-23 DIAGNOSIS — Z7982 Long term (current) use of aspirin: Secondary | ICD-10-CM | POA: Insufficient documentation

## 2020-06-23 DIAGNOSIS — Z7984 Long term (current) use of oral hypoglycemic drugs: Secondary | ICD-10-CM | POA: Insufficient documentation

## 2020-06-23 DIAGNOSIS — D696 Thrombocytopenia, unspecified: Secondary | ICD-10-CM | POA: Diagnosis not present

## 2020-06-23 DIAGNOSIS — Z8616 Personal history of COVID-19: Secondary | ICD-10-CM | POA: Diagnosis not present

## 2020-06-23 HISTORY — DX: Chronic myeloid leukemia, BCR/ABL-positive, not having achieved remission: C92.10

## 2020-06-23 HISTORY — DX: Gastro-esophageal reflux disease without esophagitis: K21.9

## 2020-06-23 HISTORY — DX: Anemia, unspecified: D64.9

## 2020-06-23 HISTORY — DX: Hyperlipidemia, unspecified: E78.5

## 2020-06-23 LAB — COMPREHENSIVE METABOLIC PANEL
ALT: 8 U/L (ref 0–44)
AST: 22 U/L (ref 15–41)
Albumin: 3.4 g/dL — ABNORMAL LOW (ref 3.5–5.0)
Alkaline Phosphatase: 101 U/L (ref 38–126)
Anion gap: 6 (ref 5–15)
BUN: 13 mg/dL (ref 8–23)
CO2: 29 mmol/L (ref 22–32)
Calcium: 9.6 mg/dL (ref 8.9–10.3)
Chloride: 105 mmol/L (ref 98–111)
Creatinine, Ser: 0.81 mg/dL (ref 0.44–1.00)
GFR, Estimated: >60 mL/min (ref >60)
Glucose, Bld: 83 mg/dL (ref 70–99)
Potassium: 4 mmol/L (ref 3.5–5.1)
Sodium: 140 mmol/L (ref 135–145)
Total Bilirubin: 1.9 mg/dL — ABNORMAL HIGH (ref 0.3–1.2)
Total Protein: 6.7 g/dL (ref 6.5–8.1)

## 2020-06-23 LAB — CBC WITH DIFFERENTIAL/PLATELET
Abs Immature Granulocytes: 0.64 10*3/uL — ABNORMAL HIGH (ref 0.00–0.07)
Basophils Absolute: 0.1 10*3/uL (ref 0.0–0.1)
Basophils Relative: 1 %
Eosinophils Absolute: 0 10*3/uL (ref 0.0–0.5)
Eosinophils Relative: 0 %
HCT: 27.2 % — ABNORMAL LOW (ref 36.0–46.0)
Hemoglobin: 8 g/dL — ABNORMAL LOW (ref 12.0–15.0)
Immature Granulocytes: 9 %
Lymphocytes Relative: 41 %
Lymphs Abs: 3 10*3/uL (ref 0.7–4.0)
MCH: 30.2 pg (ref 26.0–34.0)
MCHC: 29.4 g/dL — ABNORMAL LOW (ref 30.0–36.0)
MCV: 102.6 fL — ABNORMAL HIGH (ref 80.0–100.0)
Monocytes Absolute: 2.1 10*3/uL — ABNORMAL HIGH (ref 0.1–1.0)
Monocytes Relative: 29 %
Neutro Abs: 1.4 10*3/uL — ABNORMAL LOW (ref 1.7–7.7)
Neutrophils Relative %: 20 %
Platelets: 19 10*3/uL — CL (ref 150–400)
RBC: 2.65 MIL/uL — ABNORMAL LOW (ref 3.87–5.11)
RDW: 24.5 % — ABNORMAL HIGH (ref 11.5–15.5)
WBC: 7.3 10*3/uL (ref 4.0–10.5)
nRBC: 17.5 % — ABNORMAL HIGH (ref 0.0–0.2)

## 2020-06-23 LAB — TYPE AND SCREEN
ABO/RH(D): AB POS
Antibody Screen: NEGATIVE

## 2020-06-23 LAB — MAGNESIUM: Magnesium: 1.4 mg/dL — ABNORMAL LOW (ref 1.7–2.4)

## 2020-06-23 MED ORDER — DEXAMETHASONE SODIUM PHOSPHATE 4 MG/ML IJ SOLN
4.0000 mg | Freq: Once | INTRAMUSCULAR | Status: AC
  Administered 2020-06-23: 4 mg via INTRAVENOUS
  Filled 2020-06-23: qty 1

## 2020-06-23 MED ORDER — TRAMADOL HCL 50 MG PO TABS
50.0000 mg | ORAL_TABLET | Freq: Once | ORAL | Status: AC
  Administered 2020-06-23: 50 mg via ORAL
  Filled 2020-06-23: qty 1

## 2020-06-23 MED ORDER — MAGNESIUM SULFATE 2 GM/50ML IV SOLN
2.0000 g | Freq: Once | INTRAVENOUS | Status: AC
  Administered 2020-06-23: 2 g via INTRAVENOUS
  Filled 2020-06-23: qty 50

## 2020-06-23 MED ORDER — PREDNISONE 20 MG PO TABS: 60.0000 mg | ORAL_TABLET | Freq: Every day | ORAL | 0 refills | Status: DC

## 2020-06-23 NOTE — ED Provider Notes (Signed)
[facility]-EMERGENCY DEPT Provider Note   CSN: [id number] Arrival date & time: 06/23/20  1741     History Chief Complaint  Patient presents with  . Abnormal Lab    Kathy Irwin is a 70 y.o. female hx of DM, HL, HTN, CML, here presenting with low platelets.  Patient recently had right hip surgery and is currently in rehab.  Patient states that she has been weak and tired and she was trying to stay in the facility longer.  She had blood work drawn last week and it showed a hemoglobin 8.1 and a platelet count of 24.  Since patient has a history of CML, oncology recommend that she comes here for further evaluation.  Patient is not on any blood thinners.  Denies any easy bruising or bleeding.  Patient denies any chest pain or shortness of breath.   The history is provided by the patient.       Past Medical History:  Diagnosis Date  . Acute respiratory failure with hypoxia (HCC) 12/2018  . Anemia, unspecified   . CML (chronic myelocytic leukemia) (HCC)   . Diabetes mellitus without complication (HCC)   . Esophageal reflux   . Hyperlipemia   . 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 Hypertension   . ILD (interstitial lung disease) (HCC) 12/24/2018    Patient Active Problem List   Diagnosis Date Noted  . Multifocal pneumonia 05/05/2020  . Acute metabolic encephalopathy 05/05/2020  . Closed right hip fracture (HCC) 04/29/2020  . Closed intertrochanteric fracture of hip, right, initial encounter (HCC) 04/29/2020  . Fall 04/29/2020  . Transient hypotension 04/29/2020  . Cellulitis of right lower leg 11/09/2019  . Sepsis (HCC) 11/09/2019  . Chronic diastolic CHF (congestive heart failure) (HCC) 08/02/2019  . Acute on chronic respiratory failure with hypoxemia (HCC) 08/02/2019  . Atherosclerosis of aorta (HCC) 01/23/2019  . Obesity, Class III, BMI 40-49.9 (morbid obesity) (HCC) 12/24/2018  . Pneumonia due to COVID-19 virus 11/15/2018  . Hypersensitivity  pneumonitis (HCC) 12/19/2017  . Thrombocytopenia (HCC) 12/19/2017  . Insulin-requiring or dependent type II diabetes mellitus (HCC) 12/17/2017  . HTN (hypertension) 12/17/2017  . Essential hypertension 11/05/2017  . 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HCC) 11/05/2017    Past Surgical History:  Procedure Laterality Date  . ABDOMINAL HYSTERECTOMY    . INTRAMEDULLARY (IM) NAIL INTERTROCHANTERIC Right 04/30/2020   Procedure: INTRAMEDULLARY (IM) NAIL INTERTROCHANTRIC;  Surgeon: Haddix, Kevin P, MD;  Location: [facility] OR;  Service: Orthopedics;  Laterality: Right;  . VIDEO BRONCHOSCOPY Bilateral 04/02/2019   Procedure: VIDEO BRONCHOSCOPY WITH FLUORO;  Surgeon: Mannam, Praveen, MD;  Location: [facility] ENDOSCOPY;  Service: Cardiopulmonary;  Laterality: Bilateral;     OB History   No obstetric history on file.     Family History  Problem Relation Age of Onset  . Diabetes Other   . Hypertension Other     Social History   Tobacco Use  . Smoking status: Never Smoker  . Smokeless tobacco: Never Used  Vaping Use  . Vaping Use: Never used  Substance Use Topics  . Alcohol use: Never  . Drug use: Never    Home Medications Prior to Admission medications   Medication Sig Start Date End Date Taking? Authorizing Provider  acetaminophen (TYLENOL) 500 MG tablet Take 1,000 mg by mouth at bedtime.    [provider]  albuterol (VENTOLIN HFA) 108 (90 Base) MCG/ACT inhaler INHALE 1-2 PUFFS BY MOUTH EVERY 6 HOURS AS NEEDED FOR WHEEZE OR SHORTNESS OF BREATH 05/09/20   Edwards,   Michelle P, NP  aspirin EC 81 MG EC tablet Take 1 tablet (81 mg total) by mouth daily. Swallow whole. 05/07/20   Yacobi, Sarah A, PA-C  blood glucose meter kit and supplies KIT Dispense based on patient and insurance preference. Use up to four times daily as directed. (FOR ICD-9 250.00, 250.01). For QAC - HS accuchecks. 11/22/18   Singh, Prashant K, MD  blood glucose meter kit and supplies Dispense based on patient and insurance preference.  Use up to four times daily as directed. (FOR ICD-10 E10.9, E11.9). 12/30/19   Edwards, Michelle P, NP  cetirizine (ZYRTEC ALLERGY) 10 MG tablet Take 1 tablet (10 mg total) by mouth daily for 7 days. Patient not taking: Reported on 04/29/2020 03/17/20 03/24/20  Woods, Jaclyn M, PA-C  [employer] D3 125 MCG (5000 UT) capsule Take 5,000 Units by mouth daily. 07/16/19   [provider]  Ensure Max Protein (ENSURE MAX PROTEIN) LIQD Take 330 mLs (11 oz total) by mouth 3 (three) times daily. 12/01/19   Pokhrel, Laxman, MD  famotidine (PEPCID) 40 MG tablet Take 1 tablet (40 mg total) by mouth daily. 12/02/19   Pokhrel, Laxman, MD  fluticasone (FLONASE) 50 MCG/ACT nasal spray Place 2 sprays into both nostrils daily as needed for allergies or rhinitis. 02/05/20   Edwards, Michelle P, NP  furosemide (LASIX) 20 MG tablet Take 1 pill in the morning Patient taking differently: Take 20 mg by mouth daily. 02/05/20   Edwards, Michelle P, NP  glimepiride (AMARYL) 4 MG tablet Take 4 mg by mouth daily. 02/24/20   [provider]  glucose blood (FREESTYLE LITE) test strip For glucose testing every before meals at bedtime. Diagnosis E 11.65  Can substitute to any accepted brand 11/22/18   Singh, Prashant K, MD  linaclotide (LINZESS) 145 MCG CAPS capsule Take 1 capsule (145 mcg total) by mouth daily before breakfast. 12/02/19   Pokhrel, Laxman, MD  magnesium gluconate (MAGONATE) 30 MG tablet Take 1 tablet (30 mg total) by mouth 2 (two) times daily. Patient taking differently: Take 30 mg by mouth daily. 01/01/20   Edwards, Michelle P, NP  metFORMIN (GLUCOPHAGE) 1000 MG tablet Take 1 tablet (1,000 mg total) by mouth 2 (two) times daily with a meal. Patient taking differently: Take 1,000 mg by mouth daily with breakfast. 12/30/19   Edwards, Michelle P, NP  methocarbamol (ROBAXIN) 500 MG tablet Take 1 tablet (500 mg total) by mouth every 8 (eight) hours as needed for muscle spasms. 02/05/20   Edwards, Michelle P, NP   metoprolol succinate (TOPROL-XL) 25 MG 24 hr tablet Take 1 tablet (25 mg total) by mouth daily. Patient taking differently: Take 25 mg by mouth See admin instructions. Take 1 tablet (25 mg) combine with 1 tablet (50 mg) to make 75 mg totally by mouth every day 02/05/20   Edwards, Michelle P, NP  metoprolol succinate (TOPROL-XL) 50 MG 24 hr tablet TAKE 1 TABLET BY MOUTH EVERY DAY IN THE MORNING Patient taking differently: Take 50 mg by mouth See admin instructions. Take 1 tablet (50 mg) combine with 1 tablet (25 mg) to make 75 mg totally by mouth every day 04/07/20   Edwards, Michelle P, NP  montelukast (SINGULAIR) 10 MG tablet Take 10 mg by mouth at bedtime. 03/05/19   [provider]  Multiple Vitamin (MULTIVITAMIN WITH MINERALS) TABS tablet Take 1 tablet by mouth daily. 12/02/19   Pokhrel, Laxman, MD  olopatadine (PATANOL) 0.1 % ophthalmic solution Place 1 drop into both eyes 2 (  two) times daily. Patient taking differently: Place 1 drop into both eyes 2 (two) times daily as needed for allergies (itchy eyes). 07/24/19   Wieters, Hallie C, PA-C  ondansetron (ZOFRAN) 4 MG tablet Take 1 tablet (4 mg total) by mouth every 8 (eight) hours as needed for nausea or vomiting. 08/15/19   Rancour, Stephen, MD  oxybutynin (DITROPAN) 5 MG tablet TAKE 1 TABLET BY MOUTH THREE TIMES A DAY 06/01/20   Edwards, Michelle P, NP  pantoprazole (PROTONIX) 40 MG tablet Take 1 tablet (40 mg total) by mouth daily. 12/01/19   Pokhrel, Laxman, MD  polyethylene glycol (MIRALAX / GLYCOLAX) 17 g packet Take 17 g by mouth daily as needed for mild constipation or moderate constipation. 12/01/19   Pokhrel, Laxman, MD  pravastatin (PRAVACHOL) 40 MG tablet Take 1 tablet (40 mg total) by mouth at bedtime. 12/30/19   Edwards, Michelle P, NP  pregabalin (LYRICA) 100 MG capsule Take 1 capsule (100 mg total) by mouth at bedtime. 02/05/20   Edwards, Michelle P, NP  senna-docusate (SENOKOT-S) 8.6-50 MG tablet Take 1 tablet by mouth 2 (two)  times daily. 11/24/19   Joseph, Preetha, MD  sertraline (ZOLOFT) 100 MG tablet Take 1 pill at bedtime Patient not taking: No sig reported 12/30/19   Edwards, Michelle P, NP  simethicone (MYLICON) 80 MG chewable tablet Chew 1 tablet (80 mg total) by mouth 4 (four) times daily as needed for flatulence. 12/01/19   Pokhrel, Laxman, MD  sodium chloride (OCEAN) 0.65 % SOLN nasal spray Place 1 spray into both nostrils as needed for congestion. 08/12/19   Choi, Jennifer, DO  tamsulosin (FLOMAX) 0.4 MG CAPS capsule Take 1 capsule (0.4 mg total) by mouth daily. 03/22/20   Edwards, Michelle P, NP  traMADol (ULTRAM) 50 MG tablet Take 1 tablet (50 mg total) by mouth every 6 (six) hours as needed for severe pain. 05/07/20   Yacobi, Sarah A, PA-C    Allergies    Other, Ativan [lorazepam], Iodine, Iodine i 131 tositumomab, Merbromin, and Tape  Review of Systems   Review of Systems  Neurological: Positive for weakness.  All other systems reviewed and are negative.   Physical Exam Updated Vital Signs Ht 5' 3" (1.6 m)   Wt 108.9 kg   BMI 42.51 kg/m   Physical Exam Vitals and nursing note reviewed.  Constitutional:      Appearance: Normal appearance.  HENT:     Head: Normocephalic.     Mouth/Throat:     Mouth: Mucous membranes are moist.  Eyes:     Extraocular Movements: Extraocular movements intact.     Pupils: Pupils are equal, round, and reactive to light.  Cardiovascular:     Rate and Rhythm: Normal rate and regular rhythm.     Pulses: Normal pulses.     Heart sounds: Normal heart sounds.  Pulmonary:     Effort: Pulmonary effort is normal.     Breath sounds: Normal breath sounds.  Abdominal:     General: Abdomen is flat.     Palpations: Abdomen is soft.  Musculoskeletal:        General: Normal range of motion.     Cervical back: Normal range of motion.  Skin:    General: Skin is warm.     Comments: No obvious ecchymosis or purpura   Neurological:     General: No focal deficit present.      Mental Status: She is alert and oriented to person, place, and time.  Psychiatric:          Mood and Affect: Mood normal.        Behavior: Behavior normal.     ED Results / Procedures / Treatments   Labs (all labs ordered are listed, but only abnormal results are displayed) Labs Reviewed  CBC WITH DIFFERENTIAL/PLATELET  COMPREHENSIVE METABOLIC PANEL  MAGNESIUM  TYPE AND SCREEN    EKG None  Radiology No results found.  Procedures Procedures   Medications Ordered in ED Medications - No data to display  ED Course  I have reviewed the triage vital signs and the nursing notes.  Pertinent labs & imaging results that were available during my care of the patient were reviewed by me and considered in my medical decision making (see chart for details).    MDM Rules/Calculators/A&P                         Kathy Irwin is a 70 y.o. female here presenting with low platelets and anemia.  Patient history of CML so we will repeat a CBC.  Patient has no obvious ecchymosis or purpura.   10:43 PM Repeat platelet count is 19.  It was 24 last week.  I looked into Dr. Grier Mitts assessment previously.  Patient actually has a history of ITP that is responsive to steroids.  I did discuss with the on-call hematologist, Dr. Lindi Adie.  He recommend Decadron and discharged back to facility with prednisone 60 mg for a week and repeat CBC in a week.  Stable for discharge and I messaged Dr. Irene Limbo to follow up the CBC.    Final Clinical Impression(s) / ED Diagnoses Final diagnoses:  None    Rx / DC Orders ED Discharge Orders    None       Drenda Freeze, MD 06/23/20 2244

## 2020-06-23 NOTE — Discharge Instructions (Signed)
Your platelet count is 19 today.  The hematologist wants to start you on prednisone 60 mg daily.  You need to have a repeat CBC in a week  Your CT scan showed that the rods are in place.   Continue your current pain med  See your orthopedic doctor for follow-up regarding your hip  Return to ER if you have easy bleeding or bruising, falls, weakness, fever

## 2020-06-23 NOTE — Telephone Encounter (Signed)
Contacted by Martinique Miller, NP w/[facility]. Patient's platelets are decreasing - 24 last Friday and 15 today. Patient is currently at [facility] for rehab following fracture of right femur. NP is requesting MD guidance [phone]. Dr.Kale informed. Dr. Irene Limbo stated patient should be evaluated in ED. Contacted Ms. Sabra Heck with MD recommendation. She stated she would notify facility to take patient to ED.

## 2020-06-23 NOTE — ED Notes (Signed)
Dr Darl Householder  Notified of Plt count 19

## 2020-06-23 NOTE — ED Notes (Signed)
Spoke to [employer]. Updated that it would be early morning before pt would be picked up. Pt updated about ETA.

## 2020-06-23 NOTE — ED Triage Notes (Signed)
PT BIB [facility] from [facility]. Pt is there for rehab post right trochanter repair. Pt was brought in today due to abnormal lab. Labs were obtained on 06/18/20 and reported today. Per paperwork and EMS report, pt has Platelet count of 24, H/H of 8.1/26.3. In her hx provided by  EMS she has hx of Anemia and Chronic Myeloid Leukemia, however pt states she has never had leukemia. Per EMS, facility was told by her "cancer doctor to send her to the hospital." Pt is A&Ox4, states she only has pain to her right hip.

## 2020-06-24 ENCOUNTER — Telehealth: Payer: Self-pay | Admitting: *Deleted

## 2020-06-24 DIAGNOSIS — D696 Thrombocytopenia, unspecified: Secondary | ICD-10-CM | POA: Diagnosis not present

## 2020-06-24 MED ORDER — FENTANYL CITRATE (PF) 100 MCG/2ML IJ SOLN
50.0000 ug | Freq: Once | INTRAMUSCULAR | Status: AC
  Administered 2020-06-24: 50 ug via INTRAVENOUS
  Filled 2020-06-24: qty 2

## 2020-06-24 NOTE — Telephone Encounter (Signed)
Per Dr.Kale - patient d/c'd from ED last night with Prednisone per Dr Lindi Adie (oncologist on call) -- Please send schedule message to schedule appt with me in clinic with labs in 2 weeks to define further treatment  Contacted Martinique Miller, NP w/[facility] [phone] to inform her of Dr. Grier Mitts plan for patient. She acknowledged information and states patient may be discharged next week from facility.  Schedule message sent to schedule patient as requested by Dr. Irene Limbo

## 2020-06-25 ENCOUNTER — Telehealth: Payer: Self-pay | Admitting: Hematology

## 2020-06-25 NOTE — Telephone Encounter (Signed)
Scheduled per 03/10 scheduled message, patient is aware of upcoming 03/21 appointment.

## 2020-06-29 ENCOUNTER — Ambulatory Visit: Payer: Self-pay | Admitting: Student

## 2020-06-29 DIAGNOSIS — T8149XA Infection following a procedure, other surgical site, initial encounter: Secondary | ICD-10-CM

## 2020-06-29 NOTE — H&P (Signed)
Orthopaedic Trauma Service (OTS) H&P  Patient ID: Danicia Terhaar MRN: [id number] DOB/AGE: [birth year] 70 y.o.  Reason for Surgery: Postoperative wound infection right leg  HPI: Monisha Siebel is an 70 y.o. female presenting for surgery on right leg.  Patient underwent intramedullary nailing of right intertrochanteric femur fracture on 04/30/2020.  Since then patient has had issue with her wound is healing.  She has been on oral antibiotic clinics (doxycycline) for about 4 weeks with no resolution of the wound drainage. The drainage is mostly serosanguineous.  She denies any fevers or chills.  Patient was recently seen in [facility] emergency department on 06/23/2020 for anemia.  A CT scan her right hip was ordered at the time which showed a small area of fluid collection around femur fracture.  Now presents for formal irrigation debridement of the area.    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   Hypertension    ILD (interstitial lung disease) ([place]) 12/24/2018    Past Surgical History:  Procedure Laterality Date   ABDOMINAL HYSTERECTOMY     INTRAMEDULLARY (IM) NAIL INTERTROCHANTERIC Right 04/30/2020   Procedure: INTRAMEDULLARY (IM) NAIL INTERTROCHANTRIC;  Surgeon: Shona Needles, MD;  Location: [place];  Service: Orthopedics;  Laterality: Right;   VIDEO BRONCHOSCOPY Bilateral 04/02/2019   Procedure: VIDEO BRONCHOSCOPY WITH FLUORO;  Surgeon: Marshell Garfinkel, MD;  Location: [facility];  Service: Cardiopulmonary;  Laterality: Bilateral;    Family History  Problem Relation Age of Onset   Diabetes Other    Hypertension Other     Social History:  reports that she has never smoked. She has never used smokeless tobacco. She reports that she does not  drink alcohol and does not use drugs.  Allergies:  Allergies  Allergen Reactions   Other Shortness Of Breath and Other (See Comments)    Newspaper ink =  new chest pain, also   Ativan [Lorazepam] Other (See Comments)    Severe confusion and agitation.    Iodine Other (See Comments)    Unknown reaction   Iodine I 131 Tositumomab    Merbromin Other (See Comments)    Unknown reaction - Mercurochrome- "Was a long time ago"    Tape Rash    Medications: Current Meds  Medication Sig   acetaminophen (TYLENOL) 500 MG tablet Take 1,000 mg by mouth at bedtime.   aspirin EC 81 MG EC tablet Take 1 tablet (81 mg total) by mouth daily. Swallow whole. (Patient taking differently: Take 81 mg by mouth daily at 12 noon. Swallow whole.)   [employer] D3 125 MCG (5000 UT) capsule Take 5,000 Units by mouth daily.   famotidine (PEPCID) 40 MG tablet Take 1 tablet (40 mg total) by mouth daily.   Fluticasone-Salmeterol (ADVAIR DISKUS) 100-50 MCG/DOSE AEPB Inhale 1 puff into the lungs every 12 (twelve) hours.   HYDROcodone-acetaminophen (NORCO) 7.5-325 MG tablet Take 1 tablet by mouth in the morning and at bedtime.   insulin glargine (LANTUS) 100 UNIT/ML injection Inject 10 Units into the skin daily.   linaclotide (LINZESS) 145 MCG CAPS capsule Take 1 capsule (145 mcg total) by mouth daily before breakfast.   magnesium gluconate (MAGONATE) 500 MG tablet Take 1,000 mg by mouth 2 (two) times daily.   metFORMIN (GLUCOPHAGE) 1000  MG tablet Take 1 tablet (1,000 mg total) by mouth 2 (two) times daily with a meal. (Patient taking differently: Take 1,000 mg by mouth daily with breakfast.)   metoprolol succinate (TOPROL-XL) 25 MG 24 hr tablet Take 1 tablet (25 mg total) by mouth daily. (Patient taking differently: Take 25 mg by mouth at bedtime.)   metoprolol succinate (TOPROL-XL) 50 MG 24 hr tablet TAKE 1 TABLET BY MOUTH EVERY DAY IN THE MORNING (Patient taking differently: Take 50 mg by mouth daily.)   montelukast (SINGULAIR)  10 MG tablet Take 10 mg by mouth at bedtime.   Multiple Vitamin (MULTIVITAMIN WITH MINERALS) TABS tablet Take 1 tablet by mouth daily. (Patient taking differently: Take 1 tablet by mouth daily at 12 noon.)   olopatadine (PATANOL) 0.1 % ophthalmic solution Place 1 drop into both eyes 2 (two) times daily.   OXYGEN Inhale 3 L into the lungs at bedtime.   pantoprazole (PROTONIX) 40 MG tablet Take 1 tablet (40 mg total) by mouth daily.   polyethylene glycol (MIRALAX / GLYCOLAX) 17 g packet Take 17 g by mouth daily as needed for mild constipation or moderate constipation. (Patient taking differently: Take 17 g by mouth daily.)   potassium chloride (KLOR-CON) 10 MEQ tablet Take 10 mEq by mouth daily at 12 noon.   pravastatin (PRAVACHOL) 40 MG tablet Take 1 tablet (40 mg total) by mouth at bedtime.   pregabalin (LYRICA) 100 MG capsule Take 1 capsule (100 mg total) by mouth at bedtime.   senna-docusate (SENOKOT-S) 8.6-50 MG tablet Take 1 tablet by mouth 2 (two) times daily.   sertraline (ZOLOFT) 100 MG tablet Take 1 pill at bedtime (Patient taking differently: Take by mouth at bedtime.)   traZODone (DESYREL) 50 MG tablet Take 100 mg by mouth at bedtime.   zinc oxide 20 % ointment Apply 1 application topically as needed for irritation.    ROS: Constitutional: No fever or chills Vision: No changes in vision ENT: No difficulty swallowing CV: No chest pain Pulm: No SOB or wheezing GI: No nausea or vomiting GU: No urgency or inability to hold urine Skin: +wounds R hip and lateral thigh Neurologic: No numbness or tingling Psychiatric: No depression or anxiety Heme: No bruising Allergic: No reaction to medications or food   Exam: There were no vitals taken for this visit. General: No acute distress Orientation: Alert and oriented x3 Mood and Affect: Mood and affect appropriate, pleasant and cooperative Gait: Gait not formally assessed.  Patient able to stand and transfer with walker. Coordination  and balance: Within normal limits  Right lower extremity: Incisions over lateral hip and thigh with some wound dehiscence and exudate present. No significant surrounding redness.  Mild to moderate serosanguineous drainage noted on dressings removed from incisions but no active drainage.  Motor and sensory function is intact distally.  Tolerates gentle hip and knee motion.  Neurovascularly intact.  Left lower extremity: Skin without lesions. No tenderness to palpation. Full painless ROM, full strength in each muscle groups without evidence of instability.   Medical Decision Making: Data: Imaging: X-ray and CT scan of right hip shows intertrochanteric femur fracture with intramedullary nail in place.  No signs of hardware failure or loosening.  Small amount of callus surrounding each of the numerous fractures.  CT scan revealed small amount of intramedullary hematoma with additional seroma in the the tissues surrounding the intramedullary nail that extends to the skin surface of the lateral soft tissues of the hip.  Labs:  Results for  orders placed or performed during the hospital encounter of 06/23/20 (from the past 168 hour(s))  CBC with Differential/Platelet   Collection Time: 06/23/20  6:54 PM  Result Value Ref Range   WBC 7.3 4.0 - 10.5 K/uL   RBC 2.65 (L) 3.87 - 5.11 MIL/uL   Hemoglobin 8.0 (L) 12.0 - 15.0 g/dL   HCT 27.2 (L) 36.0 - 46.0 %   MCV 102.6 (H) 80.0 - 100.0 fL   MCH 30.2 26.0 - 34.0 pg   MCHC 29.4 (L) 30.0 - 36.0 g/dL   RDW 24.5 (H) 11.5 - 15.5 %   Platelets 19 (LL) 150 - 400 K/uL   nRBC 17.5 (H) 0.0 - 0.2 %   Neutrophils Relative % 20 %   Neutro Abs 1.4 (L) 1.7 - 7.7 K/uL   Lymphocytes Relative 41 %   Lymphs Abs 3.0 0.7 - 4.0 K/uL   Monocytes Relative 29 %   Monocytes Absolute 2.1 (H) 0.1 - 1.0 K/uL   Eosinophils Relative 0 %   Eosinophils Absolute 0.0 0.0 - 0.5 K/uL   Basophils Relative 1 %   Basophils Absolute 0.1 0.0 - 0.1 K/uL   Immature Granulocytes 9 %   Abs  Immature Granulocytes 0.64 (H) 0.00 - 0.07 K/uL   Polychromasia PRESENT    Basophilic Stippling PRESENT    Stomatocytes PRESENT   Comprehensive metabolic panel   Collection Time: 06/23/20  6:54 PM  Result Value Ref Range   Sodium 140 135 - 145 mmol/L   Potassium 4.0 3.5 - 5.1 mmol/L   Chloride 105 98 - 111 mmol/L   CO2 29 22 - 32 mmol/L   Glucose, Bld 83 70 - 99 mg/dL   BUN 13 8 - 23 mg/dL   Creatinine, Ser 0.81 0.44 - 1.00 mg/dL   Calcium 9.6 8.9 - 10.3 mg/dL   Total Protein 6.7 6.5 - 8.1 g/dL   Albumin 3.4 (L) 3.5 - 5.0 g/dL   AST 22 15 - 41 U/L   ALT 8 0 - 44 U/L   Alkaline Phosphatase 101 38 - 126 U/L   Total Bilirubin 1.9 (H) 0.3 - 1.2 mg/dL   GFR, Estimated >60 >60 mL/min   Anion gap 6 5 - 15  Magnesium   Collection Time: 06/23/20  6:54 PM  Result Value Ref Range   Magnesium 1.4 (L) 1.7 - 2.4 mg/dL  Type and screen   Collection Time: 06/23/20  6:54 PM  Result Value Ref Range   ABO/RH(D) AB POS    Antibody Screen NEG    Sample Expiration      06/26/2020,2359 Performed at [facility], [place] [address], [place], [place] [address]     Assessment/Plan: 70 year old female status post intramedullary nail right intertrochanteric/subtrochanteric femur fracture 04/30/2020  Patient has had continued wound drainage over the last 4 weeks has not resolved with multiple courses of oral antibiotics.  Would recommend proceeding with formal irrigation debridement of the area at this point.  We will plan to obtain intraoperative cultures for appropriate antibiotic management.  Plan to admit patient until culture results return.  Risks and benefits of procedure discussed patient and her family member.  She agrees to proceed with surgery.     Adelfo Diebel A. Carmie Kanner [facility] [phone] (office) [email or web address]

## 2020-07-01 ENCOUNTER — Inpatient Hospital Stay (HOSPITAL_COMMUNITY): Payer: 59 | Admitting: Anesthesiology

## 2020-07-01 ENCOUNTER — Encounter (HOSPITAL_COMMUNITY): Payer: Self-pay | Admitting: Student

## 2020-07-01 ENCOUNTER — Other Ambulatory Visit: Payer: Self-pay | Admitting: *Deleted

## 2020-07-01 DIAGNOSIS — D696 Thrombocytopenia, unspecified: Secondary | ICD-10-CM

## 2020-07-01 DIAGNOSIS — C9310 Chronic myelomonocytic leukemia not having achieved remission: Secondary | ICD-10-CM

## 2020-07-01 NOTE — Progress Notes (Signed)
Ms Kathy Irwin is a rehab patient at [facility]. I spoke with [name] nurse, Caryl Ada, I also faxed and reviewed instructions with nurse. Caryl Ada reported that patient is alert and oriented, she is a 1 person transfer. Patient wears oxygen at 2 l 24/7. CBG's run 190's- 200, ocassional low reading. ViItal signs have been stable with no temperatures.

## 2020-07-01 NOTE — Anesthesia Preprocedure Evaluation (Signed)
Anesthesia Evaluation    Reviewed: Allergy & Precautions, Patient's Chart, lab work & pertinent test results  History of Anesthesia Complications Negative for: history of anesthetic complications  Airway        Dental   Pulmonary   ILD           Cardiovascular hypertension, Pt. on medications and Pt. on home beta blockers +CHF       Neuro/Psych negative neurological ROS  negative psych ROS   GI/Hepatic Neg liver ROS, GERD  Medicated,  Endo/Other  diabetes, Type 2, Insulin Dependent, Oral Hypoglycemic AgentsMorbid obesity  Renal/GU negative Renal ROS     Musculoskeletal negative musculoskeletal ROS (+)   Abdominal   Peds  Hematology  (+) anemia ,  Severe thrombocytopenia, Plt 19k CML    Anesthesia Other Findings Hx Covid+ 10/2019   Reproductive/Obstetrics                             Anesthesia Physical Anesthesia Plan  ASA: III  Anesthesia Plan: General   Post-op Pain Management:    Induction: Intravenous  PONV Risk Score and Plan: 3 and Treatment may vary due to age or medical condition, Ondansetron and Propofol infusion  Airway Management Planned: LMA  Additional Equipment: None  Intra-op Plan:   Post-operative Plan: Extubation in OR  Informed Consent:   Plan Discussed with: CRNA and Anesthesiologist  Anesthesia Plan Comments: (Plan for platelet transfusion )        Anesthesia Quick Evaluation

## 2020-07-01 NOTE — Pre-Procedure Instructions (Addendum)
Kathy Irwin  07/01/2020     Your procedure is scheduled on Friday 07/02/20  Report to [facility], Main Entrance or Entrance "A" at 5:30 AM                Your surgery or procedure is scheduled to begin at 7:30 AM   Call this number if you have problems the morning of surgery: [phone]  This is the number for the Pre- Surgical Desk.      >>>>>Please send patient's Medication Record with medications administrated documentation. ( this information is required prior to OR. This includes medications that may have been on hold for surgery)<<<<<   Remember:   Do not eat after midnight.  You may drink clear liquids until 4:30 AM.   Clear liquids allowed are:   Water, Juice (non-citric and without pulp - diabetics please choose diet or no sugar options), Carbonated beverages - (diabetics please choose diet or no sugar options), Clear Tea, Black Coffee only (no creamer, milk or cream including half and half), Plain Jell-O only (diabetics please choose diet or no sugar options), Gatorade (diabetics please choose diet or no sugar options) and Plain Popsicles only    Take these medicines the morning of surgery with A SIP OF WATER:  Famotidine Metoprolol Eye drops Sertraline.   If Needed: Albuterol inhaler Hycodone- Acetaminophen or Tylenol Tramadol  Flonase  WHAT DO I DO ABOUT MY DIABETES MEDICATION?  THE NIGHT BEFORE SURGERY, take _5_units of _Lantus insulin . Do not take oral diabetes medicines (pills) the morning of surgery. --metFORMIN (GLUCOPHAGE)  STOP taking Aspirin, Aspirin Products (Goody Powder, Excedrin Migraine), Ibuprofen (Advil), Naproxen (Aleve), Vitamins and Herbal Products (ie Fish Oil).     o The morning of surgery check CBG when sh wake up and every 2 hours until you get to the Short Stay unit. . If your blood sugar is less than 70 mg/dL, you will need to treat for low blood sugar:   Recheck blood sugar in 15 minutes after treatment (to make sure  it is greater than 70 mg/dL). If your blood sugar is not greater than 70 mg/dL on recheck, call [phone] o  for further instructions. . Report your blood sugar to the short stay nurse when you get to Short Stay.  Patient should have a Shower with antibacteria soap, wear clean clothes, brush teeth. No lotions, powders, colognes, deodorant, make up jewelry.

## 2020-07-02 ENCOUNTER — Encounter (HOSPITAL_COMMUNITY)
Admission: RE | Disposition: A | Discharge status: Home / Self Care | Payer: Self-pay | Source: Home / Self Care | Attending: Student

## 2020-07-02 ENCOUNTER — Other Ambulatory Visit: Payer: Self-pay

## 2020-07-02 ENCOUNTER — Ambulatory Visit (HOSPITAL_COMMUNITY)
Admission: RE | Admit: 2020-07-02 | Discharge: 2020-07-02 | Disposition: A | Discharge status: Home / Self Care | Payer: 59 | Attending: Student | Admitting: Student

## 2020-07-02 ENCOUNTER — Encounter (HOSPITAL_COMMUNITY): Payer: Self-pay | Admitting: Student

## 2020-07-02 DIAGNOSIS — Z01818 Encounter for other preprocedural examination: Secondary | ICD-10-CM | POA: Diagnosis present

## 2020-07-02 DIAGNOSIS — Z538 Procedure and treatment not carried out for other reasons: Secondary | ICD-10-CM | POA: Diagnosis not present

## 2020-07-02 DIAGNOSIS — Z20822 Contact with and (suspected) exposure to covid-19: Secondary | ICD-10-CM | POA: Diagnosis not present

## 2020-07-02 DIAGNOSIS — T8149XA Infection following a procedure, other surgical site, initial encounter: Secondary | ICD-10-CM | POA: Insufficient documentation

## 2020-07-02 LAB — CBC
HCT: 27.7 % — ABNORMAL LOW (ref 36.0–46.0)
Hemoglobin: 8.1 g/dL — ABNORMAL LOW (ref 12.0–15.0)
MCH: 30.2 pg (ref 26.0–34.0)
MCHC: 29.2 g/dL — ABNORMAL LOW (ref 30.0–36.0)
MCV: 103.4 fL — ABNORMAL HIGH (ref 80.0–100.0)
Platelets: 44 10*3/uL — ABNORMAL LOW (ref 150–400)
RBC: 2.68 MIL/uL — ABNORMAL LOW (ref 3.87–5.11)
RDW: 23 % — ABNORMAL HIGH (ref 11.5–15.5)
WBC: 5.7 10*3/uL (ref 4.0–10.5)
nRBC: 8.3 % — ABNORMAL HIGH (ref 0.0–0.2)

## 2020-07-02 LAB — BPAM PLATELET PHERESIS
Blood Product Expiration Date: 202203202359
ISSUE DATE / TIME: 202203180746
Unit Type and Rh: 6200

## 2020-07-02 LAB — PREPARE PLATELET PHERESIS: Unit division: 0

## 2020-07-02 LAB — GLUCOSE, CAPILLARY: Glucose-Capillary: 100 mg/dL — ABNORMAL HIGH (ref 70–99)

## 2020-07-02 LAB — SARS CORONAVIRUS 2 BY RT PCR (HOSPITAL ORDER, PERFORMED IN ~~LOC~~ HOSPITAL LAB): SARS Coronavirus 2: NEGATIVE

## 2020-07-02 LAB — PATHOLOGIST SMEAR REVIEW

## 2020-07-02 SURGERY — IRRIGATION AND DEBRIDEMENT EXTREMITY
Anesthesia: General | Laterality: Right

## 2020-07-02 MED ORDER — CHLORHEXIDINE GLUCONATE 0.12 % MT SOLN
15.0000 mL | Freq: Once | OROMUCOSAL | Status: AC
  Administered 2020-07-02: 15 mL via OROMUCOSAL
  Filled 2020-07-02: qty 15

## 2020-07-02 MED ORDER — FLUTICASONE-SALMETEROL 100-50 MCG/DOSE IN AEPB: 1.0000 | INHALATION_SPRAY | Freq: Two times a day (BID) | RESPIRATORY_TRACT | 1 refills | Status: DC

## 2020-07-02 MED ORDER — METOPROLOL SUCCINATE ER 25 MG PO TB24
ORAL_TABLET | ORAL | Status: AC
  Filled 2020-07-02: qty 2

## 2020-07-02 MED ORDER — HYDROCODONE-ACETAMINOPHEN 7.5-325 MG PO TABS: 1.0000 | ORAL_TABLET | Freq: Two times a day (BID) | ORAL | 0 refills | Status: DC

## 2020-07-02 MED ORDER — PROPOFOL 10 MG/ML IV BOLUS
INTRAVENOUS | Status: AC
  Filled 2020-07-02: qty 40

## 2020-07-02 MED ORDER — FENTANYL CITRATE (PF) 250 MCG/5ML IJ SOLN
INTRAMUSCULAR | Status: AC
  Filled 2020-07-02: qty 5

## 2020-07-02 MED ORDER — ORAL CARE MOUTH RINSE: 15.0000 mL | Freq: Once | OROMUCOSAL | Status: AC

## 2020-07-02 MED ORDER — LACTATED RINGERS IV SOLN: INTRAVENOUS | Status: DC

## 2020-07-02 MED ORDER — METOPROLOL SUCCINATE ER 25 MG PO TB24: 50.0000 mg | ORAL_TABLET | Freq: Once | ORAL | Status: DC

## 2020-07-02 NOTE — Progress Notes (Signed)
Per surgeon, surgery cancelled, surgeon in room with patient to discuss plan.

## 2020-07-02 NOTE — Progress Notes (Signed)
Patient discharged from preo-op department. Taken out via wheelchair by NT meet [name] (daughter).

## 2020-07-02 NOTE — Interval H&P Note (Signed)
History and Physical Interval Note:  07/02/2020 6:43 AM  Kathy Irwin  has presented today for surgery, with the diagnosis of Right hip drainage.  The various methods of treatment have been discussed with the patient and family. After consideration of risks, benefits and other options for treatment, the patient has consented to  Procedure(s): IRRIGATION AND DEBRIDEMENT RIGHT THIGH (Right) as a surgical intervention.  The patient's history has been reviewed, patient examined, no change in status, stable for surgery.  I have reviewed the patient's chart and labs.  Questions were answered to the patient's satisfaction.     Lennette Bihari P Jakota Manthei

## 2020-07-02 NOTE — Discharge Instructions (Signed)
Orthopaedic Trauma Service Discharge Instructions   General Discharge Instructions  WEIGHT BEARING STATUS: Weightbearing as tolerated  RANGE OF MOTION/ACTIVITY: Ok for unrestricted range of motion  Wound Care: Incisions can be left open to air if there is no drainage. If incision continues to have drainage, follow wound care instructions below. Okay to shower if no drainage from incisions.  - Do NOT apply any ointments, solutions or lotions to pin sites or surgical wounds.  These prevent needed drainage and even though solutions like hydrogen peroxide kill bacteria, they also damage cells lining the pin sites that help fight infection.  Applying lotions or ointments can keep the wounds moist and can cause them to breakdown and open up as well. This can increase the risk for infection. When in doubt call the office.  - Surgical incisions should be dressed as needed with dry dressing.   - If any drainage is noted, use guaze or dry dressing  - Once the incision is completely dry and without drainage, it may be left open to air out.  Showering may begin 36-48 hours later.  Cleaning gently with soap and water.  DVT/PE prophylaxis: None  Diet: as you were eating previously.  Can use over the counter stool softeners and bowel preparations, such as Miralax, to help with bowel movements.  Narcotics can be constipating.  Be sure to drink plenty of fluids  PAIN MEDICATION USE AND EXPECTATIONS  You have likely been given narcotic medications to help control your pain.  After a traumatic event that results in an fracture (broken bone) with or without surgery, it is ok to use narcotic pain medications to help control one's pain.  We understand that everyone responds to pain differently and each individual patient will be evaluated on a regular basis for the continued need for narcotic medications. Ideally, narcotic medication use should last no more than 6-8 weeks (coinciding with fracture healing).    As a patient it is your responsibility as well to monitor narcotic medication use and report the amount and frequency you use these medications when you come to your office visit.   We would also advise that if you are using narcotic medications, you should take a dose prior to therapy to maximize you participation.  IF YOU ARE ON NARCOTIC MEDICATIONS IT IS NOT PERMISSIBLE TO OPERATE A MOTOR VEHICLE (MOTORCYCLE/CAR/TRUCK/MOPED) OR HEAVY MACHINERY DO NOT MIX NARCOTICS WITH OTHER CNS (CENTRAL NERVOUS SYSTEM) DEPRESSANTS SUCH AS ALCOHOL   STOP SMOKING OR USING NICOTINE PRODUCTS!!!!  As discussed nicotine severely impairs your body's ability to heal surgical and traumatic wounds but also impairs bone healing.  Wounds and bone heal by forming microscopic blood vessels (angiogenesis) and nicotine is a vasoconstrictor (essentially, shrinks blood vessels).  Therefore, if vasoconstriction occurs to these microscopic blood vessels they essentially disappear and are unable to deliver necessary nutrients to the healing tissue.  This is one modifiable factor that you can do to dramatically increase your chances of healing your injury.    (This means no smoking, no nicotine gum, patches, etc)  DO NOT USE NONSTEROIDAL ANTI-INFLAMMATORY DRUGS (NSAID'S)  Using products such as Advil (ibuprofen), Aleve (naproxen), Motrin (ibuprofen) for additional pain control during fracture healing can delay and/or prevent the healing response.  If you would like to take over the counter (OTC) medication, Tylenol (acetaminophen) is ok.  However, some narcotic medications that are given for pain control contain acetaminophen as well. Therefore, you should not exceed more than 4000 mg of tylenol  in a day if you do not have liver disease.  Also note that there are may OTC medicines, such as cold medicines and allergy medicines that my contain tylenol as well.  If you have any questions about medications and/or interactions please ask  your doctor/PA or your pharmacist.      ICE AND ELEVATE INJURED/OPERATIVE EXTREMITY  Using ice and elevating the injured extremity above your heart can help with swelling and pain control.  Icing in a pulsatile fashion, such as 20 minutes on and 20 minutes off, can be followed.    Do not place ice directly on skin. Make sure there is a barrier between to skin and the ice pack.    Using frozen items such as frozen peas works well as the conform nicely to the are that needs to be iced.  USE AN ACE WRAP OR TED HOSE FOR SWELLING CONTROL  In addition to icing and elevation, Ace wraps or TED hose are used to help limit and resolve swelling.  It is recommended to use Ace wraps or TED hose until you are informed to stop.    When using Ace Wraps start the wrapping distally (farthest away from the body) and wrap proximally (closer to the body)   Example: If you had surgery on your leg or thing and you do not have a splint on, start the ace wrap at the toes and work your way up to the thigh        If you had surgery on your upper extremity and do not have a splint on, start the ace wrap at your fingers and work your way up to the upper arm  IF YOU ARE IN A SPLINT OR CAST DO NOT [place]   If your splint gets wet for any reason please contact the office immediately. You may shower in your splint or cast as long as you keep it dry.  This can be done by wrapping in a cast cover or garbage back (or similar)  Do Not stick any thing down your splint or cast such as pencils, money, or hangers to try and scratch yourself with.  If you feel itchy take benadryl as prescribed on the bottle for itching  IF YOU ARE IN A CAM BOOT (BLACK BOOT)  You may remove boot periodically. Perform daily dressing changes as noted below.  Wash the liner of the boot regularly and wear a sock when wearing the boot. It is recommended that you sleep in the boot until told otherwise   CALL THE OFFICE WITH ANY QUESTIONS OR  CONCERNS: [phone]   VISIT OUR WEBSITE FOR ADDITIONAL INFORMATION: [email or web address]

## 2020-07-02 NOTE — Progress Notes (Signed)
Patient's wound has not drained much since 2 days ago.  Still had the dressing on that was placed on 06/30/2020.  There was minimal drainage from this.  The remainder of the wound appeared to be stable.  Her thrombocytopenia was around 40.  She would require platelet transfusion preoperatively.  Unfortunately due to a massive transfusion protocol early this morning we do not have any platelets in the hospital.  As result I felt that these 2 factors combined that surgery should be delayed.  I plan to have the patient follow-up next week for reevaluation of her wound.  If she still drains we will plan to proceed next week.  If she is not draining then we will plan to continue nonoperative management.  Shona Needles, MD [facility] [phone] (office) [email or web address]

## 2020-07-04 ENCOUNTER — Other Ambulatory Visit (INDEPENDENT_AMBULATORY_CARE_PROVIDER_SITE_OTHER): Payer: Self-pay | Admitting: Primary Care

## 2020-07-04 NOTE — Telephone Encounter (Signed)
Requested Prescriptions  Pending Prescriptions Disp Refills  . fluticasone (FLONASE) 50 MCG/ACT nasal spray [Pharmacy Med Name: FLUTICASONE PROP 50 MCG SPRAY] 18.2 mL 1    Sig: PLACE 2 SPRAYS INTO BOTH NOSTRILS DAILY AS NEEDED FOR ALLERGIES OR RHINITIS.     Ear, Nose, and Throat: Nasal Preparations - Corticosteroids Passed - 07/04/2020 12:49 PM      Passed - Valid encounter within last 12 months    Recent Outpatient Visits          2 months ago OAB (overactive bladder)   [facility] Juluis Mire P, NP   4 months ago Chronic respiratory failure with hypoxia ([place])   [place] Juluis Mire P, NP   5 months ago Type 2 diabetes mellitus without complication, without long-term current use of insulin ([place])   [place], Michelle P, NP   6 months ago Hospital discharge follow-up   [place] Juluis Mire P, NP   8 months ago Type 2 diabetes mellitus without complication, without long-term current use of insulin ([place])   [place], [place], NP      Future Appointments            In 4 days Kerin Perna, NP [place]

## 2020-07-04 NOTE — Progress Notes (Signed)
HEMATOLOGY/ONCOLOGY CONSULTATION NOTE  Date of Service: 07/05/2020  Patient Care Team: Kerin Perna, NP as PCP - General (Internal Medicine) Troy Sine, MD as PCP - Cardiology (Cardiology) Bryn Gulling as Physician Assistant (Orthopedic Surgery) Haddix, Thomasene Lot, MD as Consulting Physician (Orthopedic Surgery)  CHIEF COMPLAINTS/PURPOSE OF CONSULTATION:  CMML Thrombocytopenia  HISTORY OF PRESENTING ILLNESS:   Zaryiah Barz is a wonderful 70 y.o. 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 The pt reports that she is doing well overall.  The pt reports that she was diagnosed with CML by [facility] six years ago. 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 They also did a BM Bx which showed that she had chronic leukemia but she is unsure what kind. She has not needed any treatments in 6 years and was only monitored with labs. Her RBC and WBC have remained within the normal range since her diagnosis. Previously her PLTs have gotten as low as 30K but have come back up on their own. She denies any pain or issues related to her leukemia within the last 6 years.   Pt had a recent [place] infection in August. She went back to the hospital on 09/07 with bacterial Pneumonia and was discharged on 09/10. Pt notes that her breathing has improved since that hospitalization but is not back to baseline. She was sent home with oxygen and has been using it. Pt has been using oxygen for about 8 years and she is not sure why it was given to her. Pt was previously diagnosed with Interstitial Lung Disease. She says that when her symptoms worsen she is given steroids for 6 weeks. She is currently on a course of Prednisone. Pt has had sleep apnea testing which revealed that she does not have sleep apnea. Her HTN and Diabetes have been well controlled and she has been taking her medications as prescribed. She also  has home care who has been helping her walk and get some exercise. She will be going to see Dr. Ina Homes, a Pulmonologist, on 10/08.   Most recent lab results (01/09/2019) of CBC w/diff & CMP is as follows: all values are WNL except for RDW at 18.0, PLTs at 117K, nRBC Rel at 1, Glucose at 118.   On review of systems, pt denies back pain, abdominal pain and any other symptoms.   On PMHx the pt reports ILD, HTN, Diabetes, COVID19 infection, bacterial Pneumonia. On Social Hx the pt reports she is a non-smoker and does not drink alcohol  INTERVAL HISTORY:   Liboria Putnam is a wonderful 70 y.o. 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 The patient's last visit with [place] was on 01/21/2020. The pt reports that she is doing well overall.  The pt reports that she has been discharged from the rehab and is back home. She is still getting around and taking care of things. The pt notes the fracture happened from a fall and she is supposed to be getting PT. The pt notes that she lives by herself but her children live close by in [place]. The pt denies any recent bleeding. The pt notes that she was on the Prednisone for 7 days, but was taken off Thursday. She was also started on a blood thinner she notes, as they told her she also needed to be on ASA. The pt notes that her blood  sugars have been well controlled and she knew how to adjust medication while on the Prednisone. The pt notes that her right leg is very painful. The pt notes that she has had multiple courses of antibiotics that have not helped as she continues to have wound drainage, more of a pus-like. The pt denies any OTC NSAIDs. She has been given Tramadol and Hydrocodone for pain, but this has not helped her she notes.  Lab results today 07/05/2020 of CBC w/diff and CMP is as follows: all values are WNL except for Glucose of 145, AST of 13, Total Bilirubin of 2.3, RBC of 2.61, Hgb of 7.9, HCT of 26.9, MCV of  102.7, MCHC of 29.5, RDW of 23.9, Plt of 17K, nRBC of 6.9, Neutro Abs of 1.3K. 07/05/2020 Iron saturation 39% 07/05/2020 Ferritin 792 07/05/2020 Immature Plt Fract of 46.9. 07/05/2020 LDH of 299.  On review of systems, pt reports right leg pain, wound drainage, fatigue and denies bleeding issues, abnormal bruising, and any other symptoms.  MEDICAL HISTORY:  Past Medical History:  Diagnosis Date  . Acute respiratory failure with hypoxia ([place]) 12/2018  . Anemia, unspecified   . CML (chronic myelocytic leukemia) ([place])   . Diabetes mellitus without complication ([place])   . Esophageal reflux   . Hyperlipemia   . Hypersensitivity pneumonitis ([place]) 12/19/2017  . Hypertension   . ILD (interstitial lung disease) ([place]) 12/24/2018    SURGICAL HISTORY: Past Surgical History:  Procedure Laterality Date  . ABDOMINAL HYSTERECTOMY    . INTRAMEDULLARY (IM) NAIL INTERTROCHANTERIC Right 04/30/2020   Procedure: INTRAMEDULLARY (IM) NAIL INTERTROCHANTRIC;  Surgeon: Shona Needles, MD;  Location: [facility];  Service: Orthopedics;  Laterality: Right;  Marland Kitchen VIDEO BRONCHOSCOPY Bilateral 04/02/2019   Procedure: VIDEO BRONCHOSCOPY WITH FLUORO;  Surgeon: Marshell Garfinkel, MD;  Location: [facility];  Service: Cardiopulmonary;  Laterality: Bilateral;    SOCIAL HISTORY: Social History   Socioeconomic History  . Marital status: Married    Spouse name: Not on file  . Number of children: Not on file  . Years of education: Not on file  . Highest education level: Not on file  Occupational History  . Not on file  Tobacco Use  . Smoking status: Never Smoker  . Smokeless tobacco: Never Used  Vaping Use  . Vaping Use: Never used  Substance and Sexual Activity  . Alcohol use: Never  . Drug use: Never  . Sexual activity: Not on file  Other Topics Concern  . Not on file  Social History Narrative  . Not on file   Social Determinants of Health   Financial Resource Strain: Not on file  Food Insecurity: Not on file   Transportation Needs: Not on file  Physical Activity: Not on file  Stress: Not on file  Social Connections: Not on file  Intimate Partner Violence: Not on file    FAMILY HISTORY: Family History  Problem Relation Age of Onset  . Diabetes Other   . Hypertension Other     ALLERGIES:  is allergic to other, ativan [lorazepam], iodine, iodine i 131 tositumomab, merbromin, and tape.  MEDICATIONS:  Current Outpatient Medications  Medication Sig Dispense Refill  . acetaminophen (TYLENOL) 500 MG tablet Take 1,000 mg by mouth at bedtime.    Marland Kitchen albuterol (VENTOLIN HFA) 108 (90 Base) MCG/ACT inhaler INHALE 1-2 PUFFS BY MOUTH EVERY 6 HOURS AS NEEDED FOR WHEEZE OR SHORTNESS OF BREATH (Patient taking differently: Inhale 2 puffs into the lungs every 4 (four) hours as needed for wheezing  or shortness of breath.) 8.5 each 1  . aspirin EC 81 MG EC tablet Take 1 tablet (81 mg total) by mouth daily. Swallow whole. (Patient taking differently: Take 81 mg by mouth daily at 12 noon. Swallow whole.) 30 tablet 0  . blood glucose meter kit and supplies KIT Dispense based on patient and insurance preference. Use up to four times daily as directed. (FOR ICD-9 250.00, 250.01). For QAC - HS accuchecks. 1 each 1  . blood glucose meter kit and supplies Dispense based on patient and insurance preference. Use up to four times daily as directed. (FOR ICD-10 E10.9, E11.9). 1 each 0  . [employer] D3 125 MCG (5000 UT) capsule Take 5,000 Units by mouth daily.    . Ensure Max Protein (ENSURE MAX PROTEIN) LIQD Take 330 mLs (11 oz total) by mouth 3 (three) times daily.    . famotidine (PEPCID) 40 MG tablet Take 1 tablet (40 mg total) by mouth daily.    . fluticasone (FLONASE) 50 MCG/ACT nasal spray PLACE 2 SPRAYS INTO BOTH NOSTRILS DAILY AS NEEDED FOR ALLERGIES OR RHINITIS. 18.2 mL 1  . Fluticasone-Salmeterol (ADVAIR) 100-50 MCG/DOSE AEPB Inhale 1 puff into the lungs every 12 (twelve) hours. 60 each 1  . furosemide (LASIX) 20 MG tablet  Take 1 pill in the morning (Patient taking differently: Take 20 mg by mouth daily.) 90 tablet 1  . glucose blood (FREESTYLE LITE) test strip For glucose testing every before meals at bedtime. Diagnosis E 11.65  Can substitute to any accepted brand 100 each 0  . HYDROcodone-acetaminophen (NORCO) 7.5-325 MG tablet Take 1 tablet by mouth in the morning and at bedtime. 20 tablet 0  . insulin glargine (LANTUS) 100 UNIT/ML injection Inject 10 Units into the skin daily.    Marland Kitchen linaclotide (LINZESS) 145 MCG CAPS capsule Take 1 capsule (145 mcg total) by mouth daily before breakfast.    . magnesium gluconate (MAGONATE) 500 MG tablet Take 1,000 mg by mouth 2 (two) times daily.    . metFORMIN (GLUCOPHAGE) 1000 MG tablet Take 1 tablet (1,000 mg total) by mouth 2 (two) times daily with a meal. (Patient taking differently: Take 1,000 mg by mouth daily with breakfast.) 180 tablet 3  . methocarbamol (ROBAXIN) 500 MG tablet Take 1 tablet (500 mg total) by mouth every 8 (eight) hours as needed for muscle spasms. 30 tablet 1  . metoprolol succinate (TOPROL-XL) 25 MG 24 hr tablet Take 1 tablet (25 mg total) by mouth daily. (Patient taking differently: Take 25 mg by mouth at bedtime.) 90 tablet 1  . metoprolol succinate (TOPROL-XL) 50 MG 24 hr tablet TAKE 1 TABLET BY MOUTH EVERY DAY IN THE MORNING (Patient taking differently: Take 50 mg by mouth daily.) 30 tablet 1  . montelukast (SINGULAIR) 10 MG tablet Take 10 mg by mouth at bedtime.    . Multiple Vitamin (MULTIVITAMIN WITH MINERALS) TABS tablet Take 1 tablet by mouth daily. (Patient taking differently: Take 1 tablet by mouth daily at 12 noon.)    . olopatadine (PATANOL) 0.1 % ophthalmic solution Place 1 drop into both eyes 2 (two) times daily. 5 mL 0  . ondansetron (ZOFRAN) 4 MG tablet Take 1 tablet (4 mg total) by mouth every 8 (eight) hours as needed for nausea or vomiting. 12 tablet 0  . OXYGEN Inhale 3 L into the lungs at bedtime.    . pantoprazole (PROTONIX) 40 MG  tablet Take 1 tablet (40 mg total) by mouth daily. 60 tablet 0  .  polyethylene glycol (MIRALAX / GLYCOLAX) 17 g packet Take 17 g by mouth daily as needed for mild constipation or moderate constipation. (Patient taking differently: Take 17 g by mouth daily.)  0  . potassium chloride (KLOR-CON) 10 MEQ tablet Take 10 mEq by mouth daily at 12 noon.    . pravastatin (PRAVACHOL) 40 MG tablet Take 1 tablet (40 mg total) by mouth at bedtime. 90 tablet 1  . pregabalin (LYRICA) 100 MG capsule Take 1 capsule (100 mg total) by mouth at bedtime. 90 capsule 0  . senna-docusate (SENOKOT-S) 8.6-50 MG tablet Take 1 tablet by mouth 2 (two) times daily.    . sertraline (ZOLOFT) 100 MG tablet TAKE 1 TABLET BY MOUTH AT BEDTIME 90 tablet 0  . simethicone (MYLICON) 80 MG chewable tablet Chew 1 tablet (80 mg total) by mouth 4 (four) times daily as needed for flatulence.    . sodium chloride (OCEAN) 0.65 % SOLN nasal spray Place 1 spray into both nostrils as needed for congestion. 15 mL 0  . traMADol (ULTRAM) 50 MG tablet Take 1 tablet (50 mg total) by mouth every 6 (six) hours as needed for severe pain. 20 tablet 0  . traZODone (DESYREL) 50 MG tablet Take 100 mg by mouth at bedtime.    . triamcinolone cream (KENALOG) 0.5 % Apply 1 application topically every 6 (six) hours as needed (Apply to affected area).    . trimethoprim-polymyxin b (POLYTRIM) ophthalmic solution Place 1 drop into both eyes in the morning, at noon, in the evening, and at bedtime. For Conjunctivitis    . zinc oxide 20 % ointment Apply 1 application topically See admin instructions. Apply to buttocks topically every shift for skin protection     No current facility-administered medications for this visit.    REVIEW OF SYSTEMS:   10 Point review of Systems was done is negative except as noted above.  PHYSICAL EXAMINATION: ECOG PERFORMANCE STATUS: 2 - Symptomatic, <50% confined to bed  . Vitals:   07/05/20 1502  BP: (!) 96/48  Pulse: 100  Resp:  15  Temp: 97.9 F (36.6 C)  SpO2: 99%   Filed Weights   .Body mass index is 40.56 kg/m.   Exam was given in a wheelchair.  GENERAL:alert, in no acute distress and comfortable SKIN: no acute rashes, no significant lesions EYES: conjunctiva are pink and non-injected, sclera anicteric OROPHARYNX: MMM, no exudates, no oropharyngeal erythema or ulceration NECK: supple, no JVD LYMPH:  no palpable lymphadenopathy in the cervical, axillary or inguinal regions LUNGS: clear to auscultation b/l with normal respiratory effort HEART: regular rate & rhythm ABDOMEN:  normoactive bowel sounds , non tender, not distended. Extremity: no pedal edema PSYCH: alert & oriented x 3 with fluent speech NEURO: no focal motor/sensory deficits   LABORATORY DATA:  I have reviewed the data as listed  . CBC Latest Ref Rng & Units 07/05/2020 07/02/2020 06/23/2020  WBC 4.0 - 10.5 K/uL 4.9 5.7 7.3  Hemoglobin 12.0 - 15.0 g/dL 7.9(L) 8.1(L) 8.0(L)  Hematocrit 36.0 - 46.0 % 26.8(L) 27.7(L) 27.2(L)  Platelets 150 - 400 K/uL 17(L) 44(L) 19(LL)    . CMP Latest Ref Rng & Units 07/05/2020 06/23/2020 05/10/2020  Glucose 70 - 99 mg/dL 145(H) 83 296(H)  BUN 8 - 23 mg/dL _0 Creatinine 0.44 - 1.00 mg/dL 0.68 0.81 0.70  Sodium 135 - 145 mmol/L 139 140 135  Potassium 3.5 - 5.1 mmol/L 4.2 4.0 4.7  Chloride 98 - 111 mmol/L 106 105  100  CO2 22 - 32 mmol/L _0 Calcium 8.9 - 10.3 mg/dL 9.6 9.6 9.6  Total Protein 6.5 - 8.1 g/dL 6.6 6.7 -  Total Bilirubin 0.3 - 1.2 mg/dL 2.3(H) 1.9(H) -  Alkaline Phos 38 - 126 U/L 71 101 -  AST 15 - 41 U/L 13(L) 22 -  ALT 0 - 44 U/L 10 8 -   01/21/2019 FISH:    RADIOGRAPHIC STUDIES: I have personally reviewed the radiological images as listed and agreed with the findings in the report. CT HIP RIGHT WO CONTRAST  Result Date: 06/23/2020 CLINICAL DATA:  Recent right hip ORIF for intertrochanteric fracture presenting with low platelets and anemia. EXAM: CT OF THE RIGHT HIP  WITHOUT CONTRAST TECHNIQUE: Multidetector CT imaging of the right hip was performed according to the standard protocol. Multiplanar CT image reconstructions were also generated. COMPARISON:  Radiographs 04/30/2020 FINDINGS: Bones/Joint/Cartilage Redemonstration of the a highly comminuted inter trochanteric right femur fracture post intramedullary nail placement with transcervical pinning. There is some developing callus formation about the numerous fracture fragments. Some low-intermediate attenuation surrounding the fracture fragments likely reflects a small amount of intramedullary hematoma with additional seroma in stranding extending to the skin surface of the lateral soft tissues of hip. No acute hardware complication or failure is seen otherwise. There is a background of severe degenerative changes in the left hip including larger geode formation along the articular surface of the femoral head as well as extensive periarticular spurring about the acetabulum. No acute fracture of the included bones of the pelvis is seen. Ligaments Suboptimally assessed by CT. Muscles and Tendons Intramuscular stranding and thickening a seen along the surgical approach the patient's intramedullary nail placement. Small of intramuscular thickening and possible hematoma/seroma subjacent to the iliotibial band. No frankly or retracted tendons nor other acute musculotendinous injury. Soft tissues Soft tissue swelling and edematous changes of the right lower extremity most pronounced laterally with scar stranding and scarring related to the surgical approach. No soft tissue gas or unexpected foreign body is seen. Included portions of the pelvis are free of acute abnormality. IMPRESSION: 1. Redemonstration of the highly comminuted inter trochanteric right femur fracture post intramedullary nail placement with transcervical pinning. Developing callus formation about the numerous fracture fragments. Some minimal low-intermediate  attenuation surrounding the fracture fragments likely reflects only a small amount residual hematoma or seroma with additional scarring and thickening extending to the skin surface of the lateral soft tissues of the hip. 2. No other acute osseous injury or traumatic malalignment. No other large collection or hematoma is seen. 3. Background of severe degenerative changes in the left hip. Electronically Signed   By: Lovena Le M.D.   On: 06/23/2020 22:21    ASSESSMENT & PLAN:   1) CMML-1 2)Thrombocytopenia-- likely multifactorial.  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 ITP is likely since platelets improved significantly with recent steroid use. Additional factors worsening her platelet count are infection in her right hip surgical site with drainage and absorption of platelets, multiple lines of antibiotics etc.  PLAN:  -Discussed pt labwork today, 07/05/2020; Plt very low at 17K, Hgb decreasing and becoming more anemic. -Advised pt that Plt are low due to combination of antibiotics, wound drainage/infection, CMML-1, and ITP. -Advised pt that she may need transfusion support if Hgb keep decreasing. -Will get IVIG 1g/kg x 1 to observe if this improves Plt. -Will get Nplate weekly to start at  13mg/kg for acute on chronic ITP -- especial to facilitate her rt hip I&D if needed. -transfuse platelets prn for PLT<10K of if bleeding or patient needs I&D and platelets <50k -Advised pt we will need to get a Bm Bx to observe where her CMML stands.  -Advised pt we may have to admit her today to speed up the IVIG, Nplate, and Bm Bx. The pt wishes to be admitted so that she can get everything faster, as she is in excruciating pain. -No indication to begin treatment of CMML at this time. -Will see back once more information known following hospitalization today.    FOLLOW UP: Sending to emergency room for further evaluation    The total time  spent in the appointment was 40 minutes and more than 50% was on counseling and direct patient cares, including co-ordination of cares with ED   All of the patient's questions were answered with apparent satisfaction. The patient knows to call the clinic with any problems, questions or concerns.   GSullivan LoneMD MGreenfieldAAHIVMS SHeritage Oaks HospitalCDay Kimball HospitalHematology/Oncology Physician [facility] (Office):       [phone](Work cell):  [phone](Fax):           [phone] 07/05/2020 4:09 PM  I, RReinaldo Raddle am acting as scribe for Dr. GSullivan Lone MD.      .I have reviewed the above documentation for accuracy and completeness, and I agree with the above. .Brunetta GeneraMD

## 2020-07-04 NOTE — Telephone Encounter (Signed)
Requested Prescriptions  Pending Prescriptions Disp Refills  . sertraline (ZOLOFT) 100 MG tablet [Pharmacy Med Name: SERTRALINE HCL 100 MG TABLET] 90 tablet 0    Sig: TAKE 1 TABLET BY MOUTH AT BEDTIME     Psychiatry:  Antidepressants - SSRI Passed - 07/04/2020 12:24 PM      Passed - Valid encounter within last 6 months    Recent Outpatient Visits          2 months ago OAB (overactive bladder)   [place] Jul[facility] Mire P, NP   4 months ago Chronic respiratory failure with hypoxia ([place])   [place] Juluis Mire P, NP   5 months ago Type 2 diabetes mellitus without complication, without long-term current use of insulin ([place])   [place], Michelle P, NP   6 months ago Hospital discharge follow-up   [place], [place] P, NP   8 months ago Type 2 diabetes mellitus without complication, without long-term current use of insulin ([place])   [place], [place], NP      Future Appointments            In 4 days Kerin Perna, NP [place]

## 2020-07-05 ENCOUNTER — Other Ambulatory Visit: Payer: Self-pay

## 2020-07-05 ENCOUNTER — Inpatient Hospital Stay (HOSPITAL_COMMUNITY)
Admission: EM | Admit: 2020-07-05 | Discharge: 2020-07-21 | DRG: 813 | Disposition: A | Discharge status: Home Health | Payer: 59 | Source: Ambulatory Visit | Attending: Internal Medicine | Admitting: Internal Medicine

## 2020-07-05 ENCOUNTER — Inpatient Hospital Stay: Payer: 59

## 2020-07-05 ENCOUNTER — Encounter (HOSPITAL_COMMUNITY): Payer: Self-pay | Admitting: Emergency Medicine

## 2020-07-05 ENCOUNTER — Inpatient Hospital Stay (HOSPITAL_BASED_OUTPATIENT_CLINIC_OR_DEPARTMENT_OTHER): Payer: 59 | Admitting: Hematology

## 2020-07-05 VITALS — BP 96/48 | HR 100 | Temp 97.9°F | Resp 15 | Ht 64.5 in

## 2020-07-05 DIAGNOSIS — Z7982 Long term (current) use of aspirin: Secondary | ICD-10-CM

## 2020-07-05 DIAGNOSIS — C9310 Chronic myelomonocytic leukemia not having achieved remission: Secondary | ICD-10-CM

## 2020-07-05 DIAGNOSIS — Z7984 Long term (current) use of oral hypoglycemic drugs: Secondary | ICD-10-CM

## 2020-07-05 DIAGNOSIS — R000 Tachycardia, unspecified: Secondary | ICD-10-CM | POA: Diagnosis not present

## 2020-07-05 DIAGNOSIS — R17 Unspecified jaundice: Secondary | ICD-10-CM | POA: Diagnosis present

## 2020-07-05 DIAGNOSIS — D649 Anemia, unspecified: Secondary | ICD-10-CM | POA: Insufficient documentation

## 2020-07-05 DIAGNOSIS — E785 Hyperlipidemia, unspecified: Secondary | ICD-10-CM | POA: Insufficient documentation

## 2020-07-05 DIAGNOSIS — J9621 Acute and chronic respiratory failure with hypoxia: Secondary | ICD-10-CM | POA: Diagnosis not present

## 2020-07-05 DIAGNOSIS — D696 Thrombocytopenia, unspecified: Secondary | ICD-10-CM | POA: Diagnosis not present

## 2020-07-05 DIAGNOSIS — Z8616 Personal history of COVID-19: Secondary | ICD-10-CM | POA: Insufficient documentation

## 2020-07-05 DIAGNOSIS — J849 Interstitial pulmonary disease, unspecified: Secondary | ICD-10-CM | POA: Diagnosis present

## 2020-07-05 DIAGNOSIS — C9110 Chronic lymphocytic leukemia of B-cell type not having achieved remission: Secondary | ICD-10-CM | POA: Insufficient documentation

## 2020-07-05 DIAGNOSIS — J679 Hypersensitivity pneumonitis due to unspecified organic dust: Secondary | ICD-10-CM | POA: Diagnosis not present

## 2020-07-05 DIAGNOSIS — F419 Anxiety disorder, unspecified: Secondary | ICD-10-CM | POA: Diagnosis present

## 2020-07-05 DIAGNOSIS — K5900 Constipation, unspecified: Secondary | ICD-10-CM | POA: Diagnosis not present

## 2020-07-05 DIAGNOSIS — Z794 Long term (current) use of insulin: Secondary | ICD-10-CM

## 2020-07-05 DIAGNOSIS — D693 Immune thrombocytopenic purpura: Secondary | ICD-10-CM | POA: Insufficient documentation

## 2020-07-05 DIAGNOSIS — K219 Gastro-esophageal reflux disease without esophagitis: Secondary | ICD-10-CM | POA: Diagnosis present

## 2020-07-05 DIAGNOSIS — E669 Obesity, unspecified: Secondary | ICD-10-CM | POA: Diagnosis present

## 2020-07-05 DIAGNOSIS — D6959 Other secondary thrombocytopenia: Secondary | ICD-10-CM | POA: Diagnosis present

## 2020-07-05 DIAGNOSIS — Z91048 Other nonmedicinal substance allergy status: Secondary | ICD-10-CM

## 2020-07-05 DIAGNOSIS — Z79899 Other long term (current) drug therapy: Secondary | ICD-10-CM | POA: Insufficient documentation

## 2020-07-05 DIAGNOSIS — G4730 Sleep apnea, unspecified: Secondary | ICD-10-CM | POA: Insufficient documentation

## 2020-07-05 DIAGNOSIS — M009 Pyogenic arthritis, unspecified: Secondary | ICD-10-CM

## 2020-07-05 DIAGNOSIS — I5033 Acute on chronic diastolic (congestive) heart failure: Secondary | ICD-10-CM | POA: Diagnosis not present

## 2020-07-05 DIAGNOSIS — Z20822 Contact with and (suspected) exposure to covid-19: Secondary | ICD-10-CM | POA: Diagnosis present

## 2020-07-05 DIAGNOSIS — Z8249 Family history of ischemic heart disease and other diseases of the circulatory system: Secondary | ICD-10-CM

## 2020-07-05 DIAGNOSIS — Z7189 Other specified counseling: Secondary | ICD-10-CM

## 2020-07-05 DIAGNOSIS — R4182 Altered mental status, unspecified: Secondary | ICD-10-CM | POA: Diagnosis not present

## 2020-07-05 DIAGNOSIS — R0902 Hypoxemia: Secondary | ICD-10-CM

## 2020-07-05 DIAGNOSIS — Z833 Family history of diabetes mellitus: Secondary | ICD-10-CM

## 2020-07-05 DIAGNOSIS — Z9981 Dependence on supplemental oxygen: Secondary | ICD-10-CM

## 2020-07-05 DIAGNOSIS — T8149XA Infection following a procedure, other surgical site, initial encounter: Secondary | ICD-10-CM | POA: Diagnosis present

## 2020-07-05 DIAGNOSIS — Z90710 Acquired absence of both cervix and uterus: Secondary | ICD-10-CM

## 2020-07-05 DIAGNOSIS — I110 Hypertensive heart disease with heart failure: Secondary | ICD-10-CM | POA: Diagnosis present

## 2020-07-05 DIAGNOSIS — I10 Essential (primary) hypertension: Secondary | ICD-10-CM | POA: Insufficient documentation

## 2020-07-05 DIAGNOSIS — D539 Nutritional anemia, unspecified: Secondary | ICD-10-CM | POA: Diagnosis present

## 2020-07-05 DIAGNOSIS — L89312 Pressure ulcer of right buttock, stage 2: Secondary | ICD-10-CM | POA: Diagnosis present

## 2020-07-05 DIAGNOSIS — Z6841 Body Mass Index (BMI) 40.0 and over, adult: Secondary | ICD-10-CM

## 2020-07-05 DIAGNOSIS — E119 Type 2 diabetes mellitus without complications: Secondary | ICD-10-CM | POA: Insufficient documentation

## 2020-07-05 DIAGNOSIS — E11649 Type 2 diabetes mellitus with hypoglycemia without coma: Secondary | ICD-10-CM | POA: Diagnosis not present

## 2020-07-05 DIAGNOSIS — Z888 Allergy status to other drugs, medicaments and biological substances status: Secondary | ICD-10-CM

## 2020-07-05 DIAGNOSIS — Z7951 Long term (current) use of inhaled steroids: Secondary | ICD-10-CM

## 2020-07-05 LAB — PREPARE RBC (CROSSMATCH)

## 2020-07-05 LAB — CBC WITH DIFFERENTIAL (CANCER CENTER ONLY)
Abs Immature Granulocytes: 0.32 10*3/uL — ABNORMAL HIGH (ref 0.00–0.07)
Basophils Absolute: 0 10*3/uL (ref 0.0–0.1)
Basophils Relative: 0 %
Eosinophils Absolute: 0 10*3/uL (ref 0.0–0.5)
Eosinophils Relative: 0 %
HCT: 26.8 % — ABNORMAL LOW (ref 36.0–46.0)
Hemoglobin: 7.9 g/dL — ABNORMAL LOW (ref 12.0–15.0)
Immature Granulocytes: 7 %
Lymphocytes Relative: 39 %
Lymphs Abs: 2 10*3/uL (ref 0.7–4.0)
MCH: 30.3 pg (ref 26.0–34.0)
MCHC: 29.5 g/dL — ABNORMAL LOW (ref 30.0–36.0)
MCV: 102.7 fL — ABNORMAL HIGH (ref 80.0–100.0)
Monocytes Absolute: 1.3 10*3/uL — ABNORMAL HIGH (ref 0.1–1.0)
Monocytes Relative: 27 %
Neutro Abs: 1.3 10*3/uL — ABNORMAL LOW (ref 1.7–7.7)
Neutrophils Relative %: 27 %
Platelet Count: 17 10*3/uL — ABNORMAL LOW (ref 150–400)
RBC: 2.61 MIL/uL — ABNORMAL LOW (ref 3.87–5.11)
RDW: 23.9 % — ABNORMAL HIGH (ref 11.5–15.5)
WBC Count: 4.9 10*3/uL (ref 4.0–10.5)
nRBC: 6.9 % — ABNORMAL HIGH (ref 0.0–0.2)

## 2020-07-05 LAB — CMP (CANCER CENTER ONLY)
ALT: 10 U/L (ref 0–44)
AST: 13 U/L — ABNORMAL LOW (ref 15–41)
Albumin: 3.5 g/dL (ref 3.5–5.0)
Alkaline Phosphatase: 71 U/L (ref 38–126)
Anion gap: 8 (ref 5–15)
BUN: 9 mg/dL (ref 8–23)
CO2: 25 mmol/L (ref 22–32)
Calcium: 9.6 mg/dL (ref 8.9–10.3)
Chloride: 106 mmol/L (ref 98–111)
Creatinine: 0.68 mg/dL (ref 0.44–1.00)
GFR, Estimated: >60 mL/min (ref >60)
Glucose, Bld: 145 mg/dL — ABNORMAL HIGH (ref 70–99)
Potassium: 4.2 mmol/L (ref 3.5–5.1)
Sodium: 139 mmol/L (ref 135–145)
Total Bilirubin: 2.3 mg/dL — ABNORMAL HIGH (ref 0.3–1.2)
Total Protein: 6.6 g/dL (ref 6.5–8.1)

## 2020-07-05 LAB — RESP PANEL BY RT-PCR (FLU A&B, COVID) ARPGX2
Influenza A by PCR: NEGATIVE
Influenza B by PCR: NEGATIVE
SARS Coronavirus 2 by RT PCR: NEGATIVE

## 2020-07-05 LAB — GLUCOSE, CAPILLARY: Glucose-Capillary: 153 mg/dL — ABNORMAL HIGH (ref 70–99)

## 2020-07-05 LAB — BILIRUBIN, FRACTIONATED(TOT/DIR/INDIR)
Bilirubin, Direct: 0.6 mg/dL — ABNORMAL HIGH (ref 0.0–0.2)
Indirect Bilirubin: 1.5 mg/dL — ABNORMAL HIGH (ref 0.3–0.9)
Total Bilirubin: 2.1 mg/dL — ABNORMAL HIGH (ref 0.3–1.2)

## 2020-07-05 LAB — LACTATE DEHYDROGENASE: LDH: 299 U/L — ABNORMAL HIGH (ref 98–192)

## 2020-07-05 LAB — IMMATURE PLATELET FRACTION: Immature Platelet Fraction: 46.9 % — ABNORMAL HIGH (ref 1.2–8.6)

## 2020-07-05 MED ORDER — PREGABALIN 50 MG PO CAPS
100.0000 mg | ORAL_CAPSULE | Freq: Every day | ORAL | Status: DC
  Administered 2020-07-05 – 2020-07-12 (×8): 100 mg via ORAL
  Filled 2020-07-05 (×8): qty 2

## 2020-07-05 MED ORDER — ONDANSETRON HCL 4 MG/2ML IJ SOLN
4.0000 mg | Freq: Four times a day (QID) | INTRAMUSCULAR | Status: DC | PRN
  Administered 2020-07-12 – 2020-07-18 (×3): 4 mg via INTRAVENOUS
  Filled 2020-07-05 (×3): qty 2

## 2020-07-05 MED ORDER — ONDANSETRON HCL 4 MG/2ML IJ SOLN
4.0000 mg | Freq: Once | INTRAMUSCULAR | Status: AC
  Administered 2020-07-05: 4 mg via INTRAVENOUS
  Filled 2020-07-05: qty 2

## 2020-07-05 MED ORDER — VITAMIN D3 25 MCG (1000 UNIT) PO TABS
5000.0000 [IU] | ORAL_TABLET | Freq: Every day | ORAL | Status: DC
  Administered 2020-07-06 – 2020-07-21 (×16): 5000 [IU] via ORAL
  Filled 2020-07-05 (×16): qty 5

## 2020-07-05 MED ORDER — SERTRALINE HCL 100 MG PO TABS
100.0000 mg | ORAL_TABLET | Freq: Every day | ORAL | Status: DC
  Administered 2020-07-05 – 2020-07-20 (×16): 100 mg via ORAL
  Filled 2020-07-05 (×16): qty 1

## 2020-07-05 MED ORDER — INSULIN GLARGINE 100 UNIT/ML ~~LOC~~ SOLN
10.0000 [IU] | Freq: Every day | SUBCUTANEOUS | Status: DC
  Administered 2020-07-06 – 2020-07-13 (×8): 10 [IU] via SUBCUTANEOUS
  Filled 2020-07-05 (×8): qty 0.1

## 2020-07-05 MED ORDER — MOMETASONE FURO-FORMOTEROL FUM 100-5 MCG/ACT IN AERO
2.0000 | INHALATION_SPRAY | Freq: Two times a day (BID) | RESPIRATORY_TRACT | Status: DC
  Administered 2020-07-06 – 2020-07-16 (×20): 2 via RESPIRATORY_TRACT
  Filled 2020-07-05: qty 8.8

## 2020-07-05 MED ORDER — POLYETHYLENE GLYCOL 3350 17 G PO PACK
17.0000 g | PACK | Freq: Every day | ORAL | Status: DC | PRN
  Administered 2020-07-09 – 2020-07-10 (×2): 17 g via ORAL
  Filled 2020-07-05 (×2): qty 1

## 2020-07-05 MED ORDER — OLOPATADINE HCL 0.1 % OP SOLN
1.0000 [drp] | Freq: Two times a day (BID) | OPHTHALMIC | Status: DC
  Administered 2020-07-05 – 2020-07-21 (×30): 1 [drp] via OPHTHALMIC
  Filled 2020-07-05: qty 5

## 2020-07-05 MED ORDER — FUROSEMIDE 20 MG PO TABS
20.0000 mg | ORAL_TABLET | Freq: Every day | ORAL | Status: DC
  Administered 2020-07-06 – 2020-07-13 (×8): 20 mg via ORAL
  Filled 2020-07-05 (×8): qty 1

## 2020-07-05 MED ORDER — HYDROMORPHONE HCL 1 MG/ML IJ SOLN
0.5000 mg | Freq: Once | INTRAMUSCULAR | Status: AC
Start: 2020-07-05 — End: 2020-07-05
  Administered 2020-07-05: 0.5 mg via INTRAVENOUS
  Filled 2020-07-05: qty 1

## 2020-07-05 MED ORDER — ACETAMINOPHEN 650 MG RE SUPP: 650.0000 mg | Freq: Four times a day (QID) | RECTAL | Status: DC | PRN

## 2020-07-05 MED ORDER — PRAVASTATIN SODIUM 20 MG PO TABS
40.0000 mg | ORAL_TABLET | Freq: Every day | ORAL | Status: DC
  Administered 2020-07-05 – 2020-07-20 (×16): 40 mg via ORAL
  Filled 2020-07-05 (×16): qty 2

## 2020-07-05 MED ORDER — HYDROCODONE-ACETAMINOPHEN 7.5-325 MG PO TABS
1.0000 | ORAL_TABLET | Freq: Four times a day (QID) | ORAL | Status: DC | PRN
  Administered 2020-07-05 – 2020-07-12 (×3): 1 via ORAL
  Filled 2020-07-05 (×3): qty 1

## 2020-07-05 MED ORDER — PANTOPRAZOLE SODIUM 40 MG PO TBEC
40.0000 mg | DELAYED_RELEASE_TABLET | Freq: Every day | ORAL | Status: DC
  Administered 2020-07-06 – 2020-07-21 (×16): 40 mg via ORAL
  Filled 2020-07-05 (×16): qty 1

## 2020-07-05 MED ORDER — FAMOTIDINE 20 MG PO TABS
40.0000 mg | ORAL_TABLET | Freq: Every day | ORAL | Status: DC
  Administered 2020-07-05 – 2020-07-21 (×17): 40 mg via ORAL
  Filled 2020-07-05 (×17): qty 2

## 2020-07-05 MED ORDER — METOPROLOL SUCCINATE ER 50 MG PO TB24
75.0000 mg | ORAL_TABLET | Freq: Every day | ORAL | Status: DC
  Administered 2020-07-05 – 2020-07-13 (×9): 75 mg via ORAL
  Filled 2020-07-05 (×9): qty 1

## 2020-07-05 MED ORDER — SODIUM CHLORIDE 0.9% IV SOLUTION: Freq: Once | INTRAVENOUS | Status: DC

## 2020-07-05 MED ORDER — INSULIN ASPART 100 UNIT/ML ~~LOC~~ SOLN
0.0000 [IU] | Freq: Three times a day (TID) | SUBCUTANEOUS | Status: DC
  Administered 2020-07-06: 3 [IU] via SUBCUTANEOUS
  Administered 2020-07-06 – 2020-07-07 (×2): 5 [IU] via SUBCUTANEOUS
  Administered 2020-07-07: 2 [IU] via SUBCUTANEOUS
  Administered 2020-07-07: 3 [IU] via SUBCUTANEOUS
  Administered 2020-07-08 – 2020-07-10 (×5): 2 [IU] via SUBCUTANEOUS
  Administered 2020-07-10: 3 [IU] via SUBCUTANEOUS
  Administered 2020-07-11 – 2020-07-16 (×6): 2 [IU] via SUBCUTANEOUS
  Administered 2020-07-18: 1 [IU] via SUBCUTANEOUS
  Administered 2020-07-19 – 2020-07-20 (×2): 2 [IU] via SUBCUTANEOUS
  Administered 2020-07-21: 3 [IU] via SUBCUTANEOUS
  Filled 2020-07-05: qty 0.15

## 2020-07-05 MED ORDER — ALBUTEROL SULFATE (2.5 MG/3ML) 0.083% IN NEBU: 2.5000 mg | INHALATION_SOLUTION | RESPIRATORY_TRACT | Status: DC | PRN

## 2020-07-05 MED ORDER — MONTELUKAST SODIUM 10 MG PO TABS
10.0000 mg | ORAL_TABLET | Freq: Every day | ORAL | Status: DC
  Administered 2020-07-05 – 2020-07-20 (×16): 10 mg via ORAL
  Filled 2020-07-05 (×16): qty 1

## 2020-07-05 MED ORDER — ONDANSETRON HCL 4 MG PO TABS: 4.0000 mg | ORAL_TABLET | Freq: Four times a day (QID) | ORAL | Status: DC | PRN

## 2020-07-05 MED ORDER — ACETAMINOPHEN 325 MG PO TABS
650.0000 mg | ORAL_TABLET | Freq: Four times a day (QID) | ORAL | Status: DC | PRN
  Administered 2020-07-06 – 2020-07-17 (×9): 650 mg via ORAL
  Filled 2020-07-05 (×10): qty 2

## 2020-07-05 MED ORDER — TRAZODONE HCL 50 MG PO TABS
100.0000 mg | ORAL_TABLET | Freq: Every day | ORAL | Status: DC
Start: 2020-07-05 — End: 2020-07-13
  Administered 2020-07-05 – 2020-07-12 (×8): 100 mg via ORAL
  Filled 2020-07-05 (×8): qty 2

## 2020-07-05 NOTE — ED Triage Notes (Signed)
Pt coming from [facility] with platelets that were found to be at 17K and also has a non healing wound. Pt Hgb in also 7.9

## 2020-07-05 NOTE — H&P (Signed)
History and Physical    Draven Laine DTO:[id number] DOB: July 30, 1950 DOA: 07/05/2020  PCP: Kerin Perna, NP Patient coming from: Home via [facility]  Chief Complaint: Low platelets  HPI: Kathy Irwin is a 70 y.o. female with medical history significant of chronic myelomonocytic leukemia, chronic diastolic heart failure, chronic anemia, diabetes mellitus type 2, hyperlipidemia, hypersensitivity pneumonitis, interstitial lung disease, GERD, thrombocytopenia.  Patient presented secondary to thrombocytopenia from her medical oncology visit.  She went to her oncologist visit as already scheduled to see him with regard to her CMML and thrombocytopenia.  While there she was noted to have significantly worsened platelets of 17,000 and was sent to the emergency department for evaluation and admission.  She was recently discharged from skilled nursing facility for recent right hip fracture status post intramedullary nail placement with subsequent development of post operative infection.  She was planned to have irrigation debridement done last week by orthopedic surgery which was canceled secondary to thrombocytopenia and lack of available platelets for transfusion.   ED Course: Vitals: No temperature available. Pulse of 101, RR of 23, BP of 118/57, SpO2 of 95 % Labs: Hemoglobin of 7.9, platelets of 17,000 Imaging: None Medications/Course: Hydromorphone, Zofran  Review of Systems: Review of Systems  Constitutional: Negative for chills, fever and malaise/fatigue.  Respiratory: Negative for cough and shortness of breath.   Cardiovascular: Negative for chest pain.  Gastrointestinal: Negative for abdominal pain, constipation, diarrhea, nausea and vomiting.    Past Medical History:  Diagnosis Date  . Acute respiratory failure with hypoxia ([place]) 12/2018  . Anemia, unspecified   . CML (chronic myelocytic leukemia) ([place])   . Diabetes mellitus without complication ([place])   . Esophageal reflux    . Hyperlipemia   . Hypersensitivity pneumonitis ([place]) 12/19/2017  . Hypertension   . ILD (interstitial lung disease) ([place]) 12/24/2018    Past Surgical History:  Procedure Laterality Date  . ABDOMINAL HYSTERECTOMY    . INTRAMEDULLARY (IM) NAIL INTERTROCHANTERIC Right 04/30/2020   Procedure: INTRAMEDULLARY (IM) NAIL INTERTROCHANTRIC;  Surgeon: Shona Needles, MD;  Location: [facility];  Service: Orthopedics;  Laterality: Right;  Marland Kitchen VIDEO BRONCHOSCOPY Bilateral 04/02/2019   Procedure: VIDEO BRONCHOSCOPY WITH FLUORO;  Surgeon: Marshell Garfinkel, MD;  Location: [facility];  Service: Cardiopulmonary;  Laterality: Bilateral;     reports that she has never smoked. She has never used smokeless tobacco. She reports that she does not drink alcohol and does not use drugs.  Allergies  Allergen Reactions  . Other Shortness Of Breath and Other (See Comments)    Newspaper ink =  new chest pain, also  . Ativan [Lorazepam] Other (See Comments)    Severe confusion and agitation.   . Iodine Other (See Comments)    Unknown reaction  . Iodine I 131 Tositumomab   . Merbromin Other (See Comments)    Unknown reaction - Mercurochrome- "Was a long time ago"   . Tape Rash    Family History  Problem Relation Age of Onset  . Diabetes Other   . Hypertension Other    Prior to Admission medications   Medication Sig Start Date End Date Taking? Authorizing Provider  acetaminophen (TYLENOL) 500 MG tablet Take 1,000 mg by mouth at bedtime.    [provider]  albuterol (VENTOLIN HFA) 108 (90 Base) MCG/ACT inhaler INHALE 1-2 PUFFS BY MOUTH EVERY 6 HOURS AS NEEDED FOR WHEEZE OR SHORTNESS OF BREATH Patient taking differently: Inhale 2 puffs into the lungs every 4 (four) hours as needed for  wheezing or shortness of breath. 05/09/20   Kerin Perna, NP  aspirin EC 81 MG EC tablet Take 1 tablet (81 mg total) by mouth daily. Swallow whole. Patient taking differently: Take 81 mg by mouth daily at 12 noon.  Swallow whole. 05/07/20   Delray Alt, PA-C  blood glucose meter kit and supplies KIT Dispense based on patient and insurance preference. Use up to four times daily as directed. (FOR ICD-9 250.00, 250.01). For QAC - HS accuchecks. 11/22/18   Thurnell Lose, MD  blood glucose meter kit and supplies Dispense based on patient and insurance preference. Use up to four times daily as directed. (FOR ICD-10 E10.9, E11.9). 12/30/19   Kerin Perna, NP  [employer] D3 125 MCG (5000 UT) capsule Take 5,000 Units by mouth daily. 07/16/19   [provider]  Ensure Max Protein (ENSURE MAX PROTEIN) LIQD Take 330 mLs (11 oz total) by mouth 3 (three) times daily. 12/01/19   Pokhrel, Corrie Mckusick, MD  famotidine (PEPCID) 40 MG tablet Take 1 tablet (40 mg total) by mouth daily. 12/02/19   Pokhrel, Laxman, MD  fluticasone (FLONASE) 50 MCG/ACT nasal spray PLACE 2 SPRAYS INTO BOTH NOSTRILS DAILY AS NEEDED FOR ALLERGIES OR RHINITIS. 07/04/20   Kerin Perna, NP  Fluticasone-Salmeterol (ADVAIR) 100-50 MCG/DOSE AEPB Inhale 1 puff into the lungs every 12 (twelve) hours. 07/02/20   Delray Alt, PA-C  furosemide (LASIX) 20 MG tablet Take 1 pill in the morning Patient taking differently: Take 20 mg by mouth daily. 02/05/20   Kerin Perna, NP  glucose blood (FREESTYLE LITE) test strip For glucose testing every before meals at bedtime. Diagnosis E 11.65  Can substitute to any accepted brand 11/22/18   Thurnell Lose, MD  HYDROcodone-acetaminophen (NORCO) 7.5-325 MG tablet Take 1 tablet by mouth in the morning and at bedtime. 07/02/20   Delray Alt, PA-C  insulin glargine (LANTUS) 100 UNIT/ML injection Inject 10 Units into the skin daily.    [provider]  linaclotide Rolan Lipa) 145 MCG CAPS capsule Take 1 capsule (145 mcg total) by mouth daily before breakfast. 12/02/19   Pokhrel, Corrie Mckusick, MD  magnesium gluconate (MAGONATE) 500 MG tablet Take 1,000 mg by mouth 2 (two) times daily.    [provider]  metFORMIN (GLUCOPHAGE) 1000 MG tablet Take 1 tablet (1,000 mg total) by mouth 2 (two) times daily with a meal. Patient taking differently: Take 1,000 mg by mouth daily with breakfast. 12/30/19   Kerin Perna, NP  methocarbamol (ROBAXIN) 500 MG tablet Take 1 tablet (500 mg total) by mouth every 8 (eight) hours as needed for muscle spasms. 02/05/20   Kerin Perna, NP  metoprolol succinate (TOPROL-XL) 25 MG 24 hr tablet Take 1 tablet (25 mg total) by mouth daily. Patient taking differently: Take 25 mg by mouth at bedtime. 02/05/20   Kerin Perna, NP  metoprolol succinate (TOPROL-XL) 50 MG 24 hr tablet TAKE 1 TABLET BY MOUTH EVERY DAY IN THE MORNING Patient taking differently: Take 50 mg by mouth daily. 04/07/20   Kerin Perna, NP  montelukast (SINGULAIR) 10 MG tablet Take 10 mg by mouth at bedtime. 03/05/19   [provider]  Multiple Vitamin (MULTIVITAMIN WITH MINERALS) TABS tablet Take 1 tablet by mouth daily. Patient taking differently: Take 1 tablet by mouth daily at 12 noon. 12/02/19   Pokhrel, Corrie Mckusick, MD  olopatadine (PATANOL) 0.1 % ophthalmic solution Place 1 drop into both eyes 2 (two) times daily. 07/24/19  Wieters, Hallie C, PA-C  ondansetron (ZOFRAN) 4 MG tablet Take 1 tablet (4 mg total) by mouth every 8 (eight) hours as needed for nausea or vomiting. 08/15/19   Rancour, Annie Main, MD  OXYGEN Inhale 3 L into the lungs at bedtime.    [provider]  pantoprazole (PROTONIX) 40 MG tablet Take 1 tablet (40 mg total) by mouth daily. 12/01/19   Pokhrel, Corrie Mckusick, MD  polyethylene glycol (MIRALAX / GLYCOLAX) 17 g packet Take 17 g by mouth daily as needed for mild constipation or moderate constipation. Patient taking differently: Take 17 g by mouth daily. 12/01/19   Pokhrel, Corrie Mckusick, MD  potassium chloride (KLOR-CON) 10 MEQ tablet Take 10 mEq by mouth daily at 12 noon.    [provider]  pravastatin (PRAVACHOL) 40 MG tablet Take 1  tablet (40 mg total) by mouth at bedtime. 12/30/19   Kerin Perna, NP  pregabalin (LYRICA) 100 MG capsule Take 1 capsule (100 mg total) by mouth at bedtime. 02/05/20   Kerin Perna, NP  senna-docusate (SENOKOT-S) 8.6-50 MG tablet Take 1 tablet by mouth 2 (two) times daily. 11/24/19   Domenic Polite, MD  sertraline (ZOLOFT) 100 MG tablet TAKE 1 TABLET BY MOUTH AT BEDTIME 07/04/20   Kerin Perna, NP  simethicone (MYLICON) 80 MG chewable tablet Chew 1 tablet (80 mg total) by mouth 4 (four) times daily as needed for flatulence. 12/01/19   Pokhrel, Corrie Mckusick, MD  sodium chloride (OCEAN) 0.65 % SOLN nasal spray Place 1 spray into both nostrils as needed for congestion. 08/12/19   Dessa Phi, DO  traMADol (ULTRAM) 50 MG tablet Take 1 tablet (50 mg total) by mouth every 6 (six) hours as needed for severe pain. 05/07/20   Delray Alt, PA-C  traZODone (DESYREL) 50 MG tablet Take 100 mg by mouth at bedtime.    [provider]  triamcinolone cream (KENALOG) 0.5 % Apply 1 application topically every 6 (six) hours as needed (Apply to affected area).    [provider]  trimethoprim-polymyxin b (POLYTRIM) ophthalmic solution Place 1 drop into both eyes in the morning, at noon, in the evening, and at bedtime. For Conjunctivitis    [provider]  zinc oxide 20 % ointment Apply 1 application topically See admin instructions. Apply to buttocks topically every shift for skin protection    [provider]    Physical Exam:  Physical Exam Vitals and nursing note reviewed.  Constitutional:      General: She is not in acute distress.    Appearance: She is obese. She is not diaphoretic.  HENT:     Mouth/Throat:     Mouth: Mucous membranes are moist.  Eyes:     General: Vision grossly intact.     Extraocular Movements: Extraocular movements intact.     Conjunctiva/sclera: Conjunctivae normal.     Pupils: Pupils are equal, round, and reactive to light.   Cardiovascular:     Rate and Rhythm: Normal rate and regular rhythm.     Heart sounds: Murmur heard.   Systolic murmur is present with a grade of 1/6.   Pulmonary:     Effort: Pulmonary effort is normal. No respiratory distress.     Breath sounds: Normal breath sounds. No wheezing or rales.  Abdominal:     General: Bowel sounds are normal. There is no distension.     Palpations: Abdomen is soft.     Tenderness: There is no abdominal tenderness. There is no guarding or  rebound.  Musculoskeletal:        General: No tenderness. Normal range of motion.     Cervical back: Normal range of motion.     Right lower leg: No edema.     Left lower leg: No edema.  Lymphadenopathy:     Cervical: No cervical adenopathy.  Skin:    General: Skin is warm and dry.     Comments: Right hip with intact surgical scar. No surrounding ecchymosis of bleeding/oozing noted.  Neurological:     Mental Status: She is alert and oriented to person, place, and time.      Labs on Admission: I have personally reviewed following labs and imaging studies  CBC: Recent Labs  Lab 07/02/20 0625 07/05/20 1442  WBC 5.7 4.9  NEUTROABS  --  1.3*  HGB 8.1* 7.9*  HCT 27.7* 26.8*  MCV 103.4* 102.7*  PLT 44* 17*    Basic Metabolic Panel: Recent Labs  Lab 07/05/20 1442  NA 139  K 4.2  CL 106  CO2 25  GLUCOSE 145*  BUN 9  CREATININE 0.68  CALCIUM 9.6    GFR: Estimated Creatinine Clearance: 79.6 mL/min (by C-G formula based on SCr of 0.68 mg/dL).  Liver Function Tests: Recent Labs  Lab 07/05/20 1442  AST 13*  ALT 10  ALKPHOS 71  BILITOT 2.3*  PROT 6.6  ALBUMIN 3.5   No results for input(s): LIPASE, AMYLASE in the last 168 hours. No results for input(s): AMMONIA in the last 168 hours.  Coagulation Profile: No results for input(s): INR, PROTIME in the last 168 hours.  Cardiac Enzymes: No results for input(s): CKTOTAL, CKMB, CKMBINDEX, TROPONINI in the last 168 hours.  BNP (last 3  results) No results for input(s): PROBNP in the last 8760 hours.  HbA1C: No results for input(s): HGBA1C in the last 72 hours.  CBG: Recent Labs  Lab 07/02/20 0542  GLUCAP 100*    Urine analysis:    Component Value Date/Time   COLORURINE YELLOW 05/04/2020 0050   APPEARANCEUR CLEAR 05/04/2020 0050   LABSPEC 1.014 05/04/2020 0050   PHURINE 7.0 05/04/2020 0050   GLUCOSEU NEGATIVE 05/04/2020 0050   HGBUR NEGATIVE 05/04/2020 0050   BILIRUBINUR NEGATIVE 05/04/2020 0050   KETONESUR NEGATIVE 05/04/2020 0050   PROTEINUR NEGATIVE 05/04/2020 0050   UROBILINOGEN 1.0 08/14/2019 1824   NITRITE NEGATIVE 05/04/2020 0050   LEUKOCYTESUR NEGATIVE 05/04/2020 0050     Radiological Exams on Admission: No results found.  EKG: None obtained  Assessment/Plan Principal Problem:   Thrombocytopenia (HCC) Active Problems:   Hypersensitivity pneumonitis (HCC)   Obesity, Class III, BMI 40-49.9 (morbid obesity) ([place])   Essential hypertension   Postoperative wound infection of right hip   Hyperbilirubinemia   Idiopathic thrombocytopenia (HCC)   Thrombocytopenia Idiopathic thrombocytopenia Patient follows with Dr. Irene Limbo as an outpatient.  Platelets have been labile; baseline platelets appear to be around 50-60,000.  Platelets of 17,000 on admission, which is down from 44,00 three days prior.. No current evidence of bleeding. Discussed with on-call oncology who recommended transfusion for platelets less than 10k or if bleeding develops. Peripheral smear shows giant platelets. Patient was placed on aspirin per orthopedic surgery on discharge to SNF for hip fracture DVT prophylaxis. -Oncology recommendations -CBC in AM -Discontinue aspirin   Chronic anemia Baseline hemoglobin appears to be around 8.  Hemoglobin of 7.9 on admission.  No evidence of bleeding.  Stable.  Right hip infection Post-op infection Patient suffered right hip fracture in January 2022.  She  underwent intramedullary nail  placement on January 14 and was sent to skilled nursing facility for rehabilitation.  She developed postop infection of her right hip with plans for irrigation debridement by orthopedic surgery.  This was recently canceled secondary to significant thrombocytopenia and lack of availability of platelets.  Patient will need to follow-up with orthopedic surgery as an outpatient for rescheduled irrigation debridement.  Chronic respiratory failure with hypoxia Interstitial lung disease Patient uses 2 Lpm of oxygen at baseline, and 3 lpm at night. -Continue oxygen  Diabetes mellitus, type 2 Last hemoglobin A1C of 6.7%. Patient is on Lantus, metformin per unconfirmed medication history. -SSI for now; restart medications pending medication reconciliation  Hyperbilirubinemia Unknown etiology.  LDH of 299.  -Obtain fractionated bilirubin  Chronic myelomonocytic leukemia Follows with medical oncology. Not on treatment.  Chronic diastolic heart failure Hypertension she is Patient is on Toprol-XL and Lasix as an outpatient -Resume home medications pending medication reconciliation  Hyperlipidemia Patient is on pravastatin as an outpatient. -Resume pravastatin pending completion of medication reconciliation  GERD On Protonix as an outpatient -Resume Protonix pending medication reconciliation  Morbid obesity Last BMI listed as 40.58 kg/m2   DVT prophylaxis: SCDs Code Status: Full code Family Communication: None at bedside Disposition Plan: Discharge likely home in 1-3 days pending repeat CBC, oncology recommendations Consults called: Medical oncology Admission status: Observation   Cordelia Poche, MD [facility] 07/05/2020, 5:00 PM

## 2020-07-05 NOTE — ED Notes (Signed)
Patient's oxygen 87% upon this nurse and Dr. Teryl Lucy entering patient's room.  Patient states she wears 2L at home as needed during the day and 3L oxygen at night.  Placed patient on 2L via nasal cannula per Net[place], MD's request with improvement in oxygen saturations to 95%.

## 2020-07-05 NOTE — ED Provider Notes (Signed)
[facility] DEPT Provider Note   CSN: [id number] Arrival date & time: 07/05/20  1625     History No chief complaint on file.   Kathy Irwin is a 70 y.o. female.  70 yo F with a chief complaints of right hip pain.  The patient has a known infected hip but unfortunately was unable to get a procedure done due to thrombocytopenia.  She feels the drainage from the area has reduced significantly.  She is followed up with her oncologist today and has a continuing downward trending hemoglobin.  They felt it was best for her to come into the hospital to get a bone marrow biopsy and other oncology treatments including a blood transfusion.  She is currently on doxycycline for her infection.  She feels like the pain is somewhat worsened.  Denies fevers or chills.  Denies trauma.  She denies dark stool or blood in her stool.  The history is provided by the patient.  Illness Severity:  Moderate Onset quality:  Gradual Duration:  1 week Timing:  Constant Progression:  Worsening Chronicity:  New Associated symptoms: fatigue   Associated symptoms: no chest pain, no congestion, no fever, no headaches, no myalgias, no nausea, no rhinorrhea, no shortness of breath, no vomiting and no wheezing        Past Medical History:  Diagnosis Date  . Acute respiratory failure with hypoxia ([place]) 12/2018  . Anemia, unspecified   . CML (chronic myelocytic leukemia) ([place])   . Diabetes mellitus without complication ([place])   . Esophageal reflux   . Hyperlipemia   . Hypersensitivity pneumonitis ([place]) 12/19/2017  . Hypertension   . ILD (interstitial lung disease) ([place]) 12/24/2018    Patient Active Problem List   Diagnosis Date Noted  . Pre-op testing 07/02/2020  . Postoperative wound infection of right hip 06/29/2020  . Multifocal pneumonia 05/05/2020  . Acute metabolic encephalopathy 84/53/6468  . Closed right hip fracture ([place]) 04/29/2020  . Closed intertrochanteric fracture of  hip, right, initial encounter ([place]) 04/29/2020  . Fall 04/29/2020  . Transient hypotension 04/29/2020  . Cellulitis of right lower leg 11/09/2019  . Sepsis ([place]) 11/09/2019  . Chronic diastolic CHF (congestive heart failure) ([place]) 08/02/2019  . Acute on chronic respiratory failure with hypoxemia ([place]) 08/02/2019  . Atherosclerosis of aorta ([place]) 01/23/2019  . Obesity, Class III, BMI 40-49.9 (morbid obesity) ([place]) 12/24/2018  . Pneumonia due to COVID-19 virus 11/15/2018  . 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 Thrombocytopenia ([place]) 12/19/2017  . Insulin-requiring or dependent type II diabetes mellitus ([place]) 12/17/2017  . HTN (hypertension) 12/17/2017  . Essential hypertension 11/05/2017  . 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[place]) 11/05/2017    Past Surgical History:  Procedure Laterality Date  . ABDOMINAL HYSTERECTOMY    . INTRAMEDULLARY (IM) NAIL INTERTROCHANTERIC Right 04/30/2020   Procedure: INTRAMEDULLARY (IM) NAIL INTERTROCHANTRIC;  Surgeon: Shona Needles, MD;  Location: [facility];  Service: Orthopedics;  Laterality: Right;  Marland Kitchen VIDEO BRONCHOSCOPY Bilateral 04/02/2019   Procedure: VIDEO BRONCHOSCOPY WITH FLUORO;  Surgeon: Marshell Garfinkel, MD;  Location: [facility];  Service: Cardiopulmonary;  Laterality: Bilateral;     OB History   No obstetric history on file.     Family History  Problem Relation Age of Onset  . Diabetes Other   . Hypertension Other     Social History   Tobacco Use  . Smoking status: Never Smoker  . Smokeless tobacco: Never Used  Vaping Use  . Vaping Use: Never used  Substance Use Topics  .  Alcohol use: Never  . Drug use: Never    Home Medications Prior to Admission medications   Medication Sig Start Date End Date Taking? Authorizing Provider  acetaminophen (TYLENOL) 500 MG tablet Take 1,000 mg by mouth at bedtime.    [provider]  albuterol (VENTOLIN HFA) 108 (90 Base) MCG/ACT inhaler INHALE 1-2 PUFFS BY MOUTH EVERY 6 HOURS AS  NEEDED FOR WHEEZE OR SHORTNESS OF BREATH Patient taking differently: Inhale 2 puffs into the lungs every 4 (four) hours as needed for wheezing or shortness of breath. 05/09/20   Kerin Perna, NP  aspirin EC 81 MG EC tablet Take 1 tablet (81 mg total) by mouth daily. Swallow whole. Patient taking differently: Take 81 mg by mouth daily at 12 noon. Swallow whole. 05/07/20   Delray Alt, PA-C  blood glucose meter kit and supplies KIT Dispense based on patient and insurance preference. Use up to four times daily as directed. (FOR ICD-9 250.00, 250.01). For QAC - HS accuchecks. 11/22/18   Thurnell Lose, MD  blood glucose meter kit and supplies Dispense based on patient and insurance preference. Use up to four times daily as directed. (FOR ICD-10 E10.9, E11.9). 12/30/19   Kerin Perna, NP  [employer] D3 125 MCG (5000 UT) capsule Take 5,000 Units by mouth daily. 07/16/19   [provider]  Ensure Max Protein (ENSURE MAX PROTEIN) LIQD Take 330 mLs (11 oz total) by mouth 3 (three) times daily. 12/01/19   Pokhrel, Corrie Mckusick, MD  famotidine (PEPCID) 40 MG tablet Take 1 tablet (40 mg total) by mouth daily. 12/02/19   Pokhrel, Laxman, MD  fluticasone (FLONASE) 50 MCG/ACT nasal spray PLACE 2 SPRAYS INTO BOTH NOSTRILS DAILY AS NEEDED FOR ALLERGIES OR RHINITIS. 07/04/20   Kerin Perna, NP  Fluticasone-Salmeterol (ADVAIR) 100-50 MCG/DOSE AEPB Inhale 1 puff into the lungs every 12 (twelve) hours. 07/02/20   Delray Alt, PA-C  furosemide (LASIX) 20 MG tablet Take 1 pill in the morning Patient taking differently: Take 20 mg by mouth daily. 02/05/20   Kerin Perna, NP  glucose blood (FREESTYLE LITE) test strip For glucose testing every before meals at bedtime. Diagnosis E 11.65  Can substitute to any accepted brand 11/22/18   Thurnell Lose, MD  HYDROcodone-acetaminophen (NORCO) 7.5-325 MG tablet Take 1 tablet by mouth in the morning and at bedtime. 07/02/20   Delray Alt, PA-C  insulin  glargine (LANTUS) 100 UNIT/ML injection Inject 10 Units into the skin daily.    [provider]  linaclotide Rolan Lipa) 145 MCG CAPS capsule Take 1 capsule (145 mcg total) by mouth daily before breakfast. 12/02/19   Pokhrel, Corrie Mckusick, MD  magnesium gluconate (MAGONATE) 500 MG tablet Take 1,000 mg by mouth 2 (two) times daily.    [provider]  metFORMIN (GLUCOPHAGE) 1000 MG tablet Take 1 tablet (1,000 mg total) by mouth 2 (two) times daily with a meal. Patient taking differently: Take 1,000 mg by mouth daily with breakfast. 12/30/19   Kerin Perna, NP  methocarbamol (ROBAXIN) 500 MG tablet Take 1 tablet (500 mg total) by mouth every 8 (eight) hours as needed for muscle spasms. 02/05/20   Kerin Perna, NP  metoprolol succinate (TOPROL-XL) 25 MG 24 hr tablet Take 1 tablet (25 mg total) by mouth daily. Patient taking differently: Take 25 mg by mouth at bedtime. 02/05/20   Kerin Perna, NP  metoprolol succinate (TOPROL-XL) 50 MG 24 hr tablet TAKE 1 TABLET BY MOUTH EVERY DAY  IN THE MORNING Patient taking differently: Take 50 mg by mouth daily. 04/07/20   Kerin Perna, NP  montelukast (SINGULAIR) 10 MG tablet Take 10 mg by mouth at bedtime. 03/05/19   [provider]  Multiple Vitamin (MULTIVITAMIN WITH MINERALS) TABS tablet Take 1 tablet by mouth daily. Patient taking differently: Take 1 tablet by mouth daily at 12 noon. 12/02/19   Pokhrel, Corrie Mckusick, MD  olopatadine (PATANOL) 0.1 % ophthalmic solution Place 1 drop into both eyes 2 (two) times daily. 07/24/19   Wieters, Hallie C, PA-C  ondansetron (ZOFRAN) 4 MG tablet Take 1 tablet (4 mg total) by mouth every 8 (eight) hours as needed for nausea or vomiting. 08/15/19   Rancour, Annie Main, MD  OXYGEN Inhale 3 L into the lungs at bedtime.    [provider]  pantoprazole (PROTONIX) 40 MG tablet Take 1 tablet (40 mg total) by mouth daily. 12/01/19   Pokhrel, Corrie Mckusick, MD  polyethylene glycol (MIRALAX /  GLYCOLAX) 17 g packet Take 17 g by mouth daily as needed for mild constipation or moderate constipation. Patient taking differently: Take 17 g by mouth daily. 12/01/19   Pokhrel, Corrie Mckusick, MD  potassium chloride (KLOR-CON) 10 MEQ tablet Take 10 mEq by mouth daily at 12 noon.    [provider]  pravastatin (PRAVACHOL) 40 MG tablet Take 1 tablet (40 mg total) by mouth at bedtime. 12/30/19   Kerin Perna, NP  pregabalin (LYRICA) 100 MG capsule Take 1 capsule (100 mg total) by mouth at bedtime. 02/05/20   Kerin Perna, NP  senna-docusate (SENOKOT-S) 8.6-50 MG tablet Take 1 tablet by mouth 2 (two) times daily. 11/24/19   Domenic Polite, MD  sertraline (ZOLOFT) 100 MG tablet TAKE 1 TABLET BY MOUTH AT BEDTIME 07/04/20   Kerin Perna, NP  simethicone (MYLICON) 80 MG chewable tablet Chew 1 tablet (80 mg total) by mouth 4 (four) times daily as needed for flatulence. 12/01/19   Pokhrel, Corrie Mckusick, MD  sodium chloride (OCEAN) 0.65 % SOLN nasal spray Place 1 spray into both nostrils as needed for congestion. 08/12/19   Dessa Phi, DO  traMADol (ULTRAM) 50 MG tablet Take 1 tablet (50 mg total) by mouth every 6 (six) hours as needed for severe pain. 05/07/20   Delray Alt, PA-C  traZODone (DESYREL) 50 MG tablet Take 100 mg by mouth at bedtime.    [provider]  triamcinolone cream (KENALOG) 0.5 % Apply 1 application topically every 6 (six) hours as needed (Apply to affected area).    [provider]  trimethoprim-polymyxin b (POLYTRIM) ophthalmic solution Place 1 drop into both eyes in the morning, at noon, in the evening, and at bedtime. For Conjunctivitis    [provider]  zinc oxide 20 % ointment Apply 1 application topically See admin instructions. Apply to buttocks topically every shift for skin protection    [provider]    Allergies    Other, Ativan [lorazepam], Iodine, Iodine i 131 tositumomab, Merbromin, and Tape  Review of Systems    Review of Systems  Constitutional: Positive for fatigue. Negative for chills and fever.  HENT: Negative for congestion and rhinorrhea.   Eyes: Negative for redness and visual disturbance.  Respiratory: Negative for shortness of breath and wheezing.   Cardiovascular: Negative for chest pain and palpitations.  Gastrointestinal: Negative for nausea and vomiting.  Genitourinary: Negative for dysuria and urgency.  Musculoskeletal: Positive for arthralgias. Negative for myalgias.  Skin: Negative for pallor and wound.  Neurological:  Negative for dizziness and headaches.    Physical Exam Updated Vital Signs BP (!) 118/57   Pulse (!) 101   Resp (!) 23   SpO2 95%   Physical Exam Vitals and nursing note reviewed.  Constitutional:      General: She is not in acute distress.    Appearance: She is well-developed. She is not diaphoretic.  HENT:     Head: Normocephalic and atraumatic.  Eyes:     Pupils: Pupils are equal, round, and reactive to light.  Cardiovascular:     Rate and Rhythm: Normal rate and regular rhythm.     Heart sounds: No murmur heard. No friction rub. No gallop.   Pulmonary:     Effort: Pulmonary effort is normal.     Breath sounds: No wheezing or rales.  Abdominal:     General: There is no distension.     Palpations: Abdomen is soft.     Tenderness: There is no abdominal tenderness.  Musculoskeletal:        General: Tenderness present.     Cervical back: Normal range of motion and neck supple.     Comments: Tenderness about the right lateral hip.  She has a pinpoint area with some trace purulent drainage.  No obvious induration or fluctuance.  Skin:    General: Skin is warm and dry.  Neurological:     Mental Status: She is alert and oriented to person, place, and time.  Psychiatric:        Behavior: Behavior normal.     ED Results / Procedures / Treatments   Labs (all labs ordered are listed, but only abnormal results are displayed) Labs Reviewed  RESP  PANEL BY RT-PCR (FLU A&B, COVID) ARPGX2  TYPE AND SCREEN  PREPARE RBC (CROSSMATCH)    EKG None  Radiology No results found.  Procedures Procedures   Medications Ordered in ED Medications  0.9 %  sodium chloride infusion (Manually program via Guardrails IV Fluids) (has no administration in time range)  HYDROmorphone (DILAUDID) injection 0.5 mg (has no administration in time range)  ondansetron (ZOFRAN) injection 4 mg (has no administration in time range)    ED Course  I have reviewed the triage vital signs and the nursing notes.  Pertinent labs & imaging results that were available during my care of the patient were reviewed by me and considered in my medical decision making (see chart for details).    MDM Rules/Calculators/A&P                          70 yo F with a chief complaints of downtrending hemoglobin and right hip infection.  Was sent here from the oncology office when they saw that her hemoglobin continued to trend downward and felt it was best to admit her into the hospital for more emergent work-up.  Will order a unit of blood.  Will discuss with the hospitalist.   CRITICAL CARE Performed by: Cecilio Asper   Total critical care time: 35 minutes  Critical care time was exclusive of separately billable procedures and treating other patients.  Critical care was necessary to treat or prevent imminent or life-threatening deterioration.  Critical care was time spent personally by me on the following activities: development of treatment plan with patient and/or surrogate as well as nursing, discussions with consultants, evaluation of patient's response to treatment, examination of patient, obtaining history from patient or surrogate, ordering and performing treatments and interventions, ordering  and review of laboratory studies, ordering and review of radiographic studies, pulse oximetry and re-evaluation of patient's condition.  The patients results and plan  were reviewed and discussed.   Any x-rays performed were independently reviewed by myself.   Differential diagnosis were considered with the presenting HPI.  Medications  0.9 %  sodium chloride infusion (Manually program via Guardrails IV Fluids) (has no administration in time range)  HYDROmorphone (DILAUDID) injection 0.5 mg (has no administration in time range)  ondansetron (ZOFRAN) injection 4 mg (has no administration in time range)    Vitals:   07/05/20 1630  BP: (!) 118/57  Pulse: (!) 101  Resp: (!) 23  SpO2: 95%    Final diagnoses:  Thrombocytopenia (HCC)  Pyogenic arthritis of right hip, due to unspecified organism [facility])  Symptomatic anemia    Admission/ observation were discussed with the admitting physician, patient and/or family and they are comfortable with the plan.   Final Clinical Impression(s) / ED Diagnoses Final diagnoses:  Thrombocytopenia (HCC)  Pyogenic arthritis of right hip, due to unspecified organism [facility])  Symptomatic anemia    Rx / DC Orders ED Discharge Orders    None       Deno Etienne, DO 07/05/20 1652

## 2020-07-06 ENCOUNTER — Encounter (HOSPITAL_COMMUNITY): Payer: 59 | Admitting: Cardiology

## 2020-07-06 ENCOUNTER — Telehealth: Payer: Self-pay | Admitting: Hematology

## 2020-07-06 ENCOUNTER — Other Ambulatory Visit: Payer: Self-pay | Admitting: Hematology

## 2020-07-06 DIAGNOSIS — D696 Thrombocytopenia, unspecified: Secondary | ICD-10-CM | POA: Diagnosis not present

## 2020-07-06 DIAGNOSIS — I10 Essential (primary) hypertension: Secondary | ICD-10-CM | POA: Diagnosis not present

## 2020-07-06 DIAGNOSIS — J679 Hypersensitivity pneumonitis due to unspecified organic dust: Secondary | ICD-10-CM | POA: Diagnosis not present

## 2020-07-06 DIAGNOSIS — E669 Obesity, unspecified: Secondary | ICD-10-CM

## 2020-07-06 DIAGNOSIS — Z6841 Body Mass Index (BMI) 40.0 and over, adult: Secondary | ICD-10-CM

## 2020-07-06 DIAGNOSIS — D693 Immune thrombocytopenic purpura: Secondary | ICD-10-CM

## 2020-07-06 LAB — GLUCOSE, CAPILLARY
Glucose-Capillary: 110 mg/dL — ABNORMAL HIGH (ref 70–99)
Glucose-Capillary: 223 mg/dL — ABNORMAL HIGH (ref 70–99)
Glucose-Capillary: 154 mg/dL — ABNORMAL HIGH (ref 70–99)
Glucose-Capillary: 159 mg/dL — ABNORMAL HIGH (ref 70–99)

## 2020-07-06 LAB — COMPREHENSIVE METABOLIC PANEL
ALT: 10 U/L (ref 0–44)
AST: 13 U/L — ABNORMAL LOW (ref 15–41)
Albumin: 3.3 g/dL — ABNORMAL LOW (ref 3.5–5.0)
Alkaline Phosphatase: 60 U/L (ref 38–126)
Anion gap: 6 (ref 5–15)
BUN: 8 mg/dL (ref 8–23)
CO2: 27 mmol/L (ref 22–32)
Calcium: 9.2 mg/dL (ref 8.9–10.3)
Chloride: 105 mmol/L (ref 98–111)
Creatinine, Ser: 0.54 mg/dL (ref 0.44–1.00)
GFR, Estimated: >60 mL/min (ref >60)
Glucose, Bld: 113 mg/dL — ABNORMAL HIGH (ref 70–99)
Potassium: 3.8 mmol/L (ref 3.5–5.1)
Sodium: 138 mmol/L (ref 135–145)
Total Bilirubin: 2.5 mg/dL — ABNORMAL HIGH (ref 0.3–1.2)
Total Protein: 5.8 g/dL — ABNORMAL LOW (ref 6.5–8.1)

## 2020-07-06 LAB — TYPE AND SCREEN
ABO/RH(D): AB POS
Antibody Screen: NEGATIVE
Unit division: 0

## 2020-07-06 LAB — BPAM RBC
Blood Product Expiration Date: 202204132359
ISSUE DATE / TIME: 202203212335
Unit Type and Rh: 6200

## 2020-07-06 LAB — IRON AND TIBC
Iron: 78 ug/dL (ref 41–142)
Saturation Ratios: 39 % (ref 21–57)
TIBC: 203 ug/dL — ABNORMAL LOW (ref 236–444)
UIBC: 124 ug/dL (ref 120–384)

## 2020-07-06 LAB — CBC
HCT: 27.1 % — ABNORMAL LOW (ref 36.0–46.0)
Hemoglobin: 8.3 g/dL — ABNORMAL LOW (ref 12.0–15.0)
MCH: 31.1 pg (ref 26.0–34.0)
MCHC: 30.6 g/dL (ref 30.0–36.0)
MCV: 101.5 fL — ABNORMAL HIGH (ref 80.0–100.0)
Platelets: 16 10*3/uL — CL (ref 150–400)
RBC: 2.67 MIL/uL — ABNORMAL LOW (ref 3.87–5.11)
RDW: 23.4 % — ABNORMAL HIGH (ref 11.5–15.5)
WBC: 4.2 10*3/uL (ref 4.0–10.5)
nRBC: 8.8 % — ABNORMAL HIGH (ref 0.0–0.2)

## 2020-07-06 LAB — SEDIMENTATION RATE: Sed Rate: 14 mm/hr (ref 0–22)

## 2020-07-06 LAB — C-REACTIVE PROTEIN: CRP: 10.6 mg/dL — ABNORMAL HIGH (ref <1.0)

## 2020-07-06 LAB — FERRITIN: Ferritin: 792 ng/mL — ABNORMAL HIGH (ref 11–307)

## 2020-07-06 MED ORDER — IMMUNE GLOBULIN (HUMAN) 10 GM/100ML IV SOLN: 1.0000 g/kg | Freq: Once | INTRAVENOUS | Status: DC

## 2020-07-06 MED ORDER — METHYLPREDNISOLONE SODIUM SUCC 125 MG IJ SOLR
80.0000 mg | Freq: Once | INTRAMUSCULAR | Status: AC
  Administered 2020-07-06: 80 mg via INTRAVENOUS
  Filled 2020-07-06: qty 2

## 2020-07-06 MED ORDER — ROMIPLOSTIM 250 MCG ~~LOC~~ SOLR
2.0000 ug/kg | SUBCUTANEOUS | Status: DC
  Administered 2020-07-06: 220 ug via SUBCUTANEOUS
  Filled 2020-07-06: qty 0.44

## 2020-07-06 MED ORDER — IMMUNE GLOBULIN (HUMAN) 20 GM/200ML IV SOLN
110.0000 g | Freq: Once | INTRAVENOUS | Status: AC
  Administered 2020-07-06: 110 g via INTRAVENOUS
  Filled 2020-07-06: qty 1000

## 2020-07-06 MED ORDER — ACETAMINOPHEN 325 MG PO TABS
650.0000 mg | ORAL_TABLET | Freq: Once | ORAL | Status: AC
  Administered 2020-07-06: 650 mg via ORAL
  Filled 2020-07-06: qty 2

## 2020-07-06 MED ORDER — DIPHENHYDRAMINE HCL 25 MG PO CAPS
25.0000 mg | ORAL_CAPSULE | Freq: Once | ORAL | Status: AC
  Administered 2020-07-06: 25 mg via ORAL
  Filled 2020-07-06: qty 1

## 2020-07-06 NOTE — Progress Notes (Signed)
Occupational Therapy Evaluation  Patient lives home alone, recent D/C from rehab d/t hip fracture s/p IM nail. Patient reports walking with rollator at home, was still using depends because "it comes on quick and I can't always make it." Also has 4 children in the area that can assist. Currently patient set up for UB ADL, min G for LB ADL and min G for functional transfers. Patient reports pain in R leg and buttock due to sores with functional ambulation/transfers. Patient desat to 77% on room air with activity, increase to 92% with rest and 2L donned. Patient states she does not wear her O2 all the time at home "makes me sick" but has it on her wheelchair. Recommend continued acute OT services to maximize patient endurance, balance, safety in order to facilitate D/C home.     07/06/20 1400  OT Visit Information  Last OT Received On 07/06/20  Assistance Needed +1  PT/OT/SLP Co-Evaluation/Treatment Yes  Reason for Co-Treatment To address functional/ADL transfers  OT goals addressed during session ADL's and self-care  History of Present Illness 70 y.o. female with medical history significant of chronic myelomonocytic leukemia, chronic diastolic heart failure, chronic anemia, diabetes mellitus type 2, hyperlipidemia, hypersensitivity pneumonitis, interstitial lung disease, GERD, thrombocytopenia. Recent hip fracture in january 2022.  Precautions  Precautions Fall  Restrictions  Weight Bearing Restrictions No  Home Living  Family/patient expects to be discharged to: Private residence  Living Arrangements Alone  Available Help at Discharge Available PRN/intermittently;Family  Type of [place] to enter  Entrance Stairs-Number of Steps 3  Entrance Stairs-Rails [place] One level  Bathroom Shower/Tub Walk-in Engineer, materials BSC;Walker - 4 wheels;Grab bars - toilet;Grab bars - tub/shower;Shower seat  Additional Comments 2-3 L  home O2 prn at baseline; pt reports her 4 children can assist her at home prn  Prior Function  Level of Independence Independent with assistive device(s)  Comments mobility independently with rollator since DC from SNF a few days prior to this admission. 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  Communication  Communication No difficulties  Pain Assessment  Pain Assessment Faces  Faces Pain Scale 6  Pain Location R hip; perineal area feels "raw"  Pain Descriptors / Indicators Sore  Pain Intervention(s) Monitored during session  Cognition  Arousal/Alertness Awake/alert  Behavior During Therapy WFL for tasks assessed/performed  Overall Cognitive Status Within Functional Limits for tasks assessed  Upper Extremity Assessment  Upper Extremity Assessment Overall WFL for tasks assessed  Lower Extremity Assessment  Lower Extremity Assessment Defer to PT evaluation  Cervical / Trunk Assessment  Cervical / Trunk Assessment Normal  ADL  Overall ADL's  Needs assistance/impaired  Eating/Feeding Independent  Grooming Set up;Sitting  Upper Body Bathing Set up;Sitting  Lower Body Bathing Min guard;Sitting/lateral leans;Sit to/from stand  Upper Body Dressing  Set up;Sitting  Lower Body Dressing Set up;Min guard;Sitting/lateral leans;Sit to/from stand  Lower Body Dressing Details (indicate cue type and reason) seated with increased time patient able to don socks, min G in standing due to mild unsteadiness  Toilet Transfer Min guard;Ambulation;RW  Toilet Transfer Details (indicate cue type and reason) min G for safety  Toileting- Clothing Manipulation and Hygiene Moderate assistance;Sitting/lateral lean;Sit to/from stand  Toileting - Clothing Manipulation Details (indicate cue type and reason) patient pure wick leaked, able to perform peri area hygiene, assist for buttock due to decreased activity tolerance/soreness  Functional mobility during  ADLs Min guard;Rolling walker   Bed Mobility  Overal bed mobility Modified Independent  General bed mobility comments used rails, HOB up  Transfers  Overall transfer level Needs assistance  Equipment used Rolling walker (2 wheeled)  Transfers Sit to/from Stand  Sit to Stand Min guard  Balance  Overall balance assessment Needs assistance  Sitting-balance support Feet supported  Sitting balance-Leahy Scale Good  Standing balance support Bilateral upper extremity supported  Standing balance-Leahy Scale Poor  General Comments  General comments (skin integrity, edema, etc.) patient desat to 77% on RA with activity, donned 2L and patient increase to 92% HR 111  OT - End of Session  Equipment Utilized During Treatment Rolling walker;Gait belt;Oxygen  Activity Tolerance Patient tolerated treatment well  Patient left in chair;with call bell/phone within reach;with chair alarm set  Nurse Communication Mobility status  OT Assessment  OT Recommendation/Assessment Patient needs continued OT Services  OT Visit Diagnosis Unsteadiness on feet (R26.81);Pain  Pain - Right/Left Right  Pain - part of body Leg  OT Problem List Decreased activity tolerance;Impaired balance (sitting and/or standing);Decreased safety awareness;Pain;Obesity  OT Plan  OT Frequency (ACUTE ONLY) Min 2X/week  OT Treatment/Interventions (ACUTE ONLY) Self-care/ADL training;DME and/or AE instruction;Therapeutic activities;Patient/family education;Balance training  AM-PAC OT "6 Clicks" Daily Activity Outcome Measure (Version 2)  Help from another person eating meals? 4  Help from another person taking care of personal grooming? 3  Help from another person toileting, which includes using toliet, bedpan, or urinal? 2  Help from another person bathing (including washing, rinsing, drying)? 3  Help from another person to put on and taking off regular upper body clothing? 3  Help from another person to put on and taking off regular lower body clothing? 3  6 Click  Score 18  OT Recommendation  Follow Up Recommendations Home health OT;Supervision - Intermittent  OT Equipment None recommended by OT  Individuals Consulted  Consulted and Agree with Results and Recommendations Patient  Acute Rehab OT Goals  Patient Stated Goal DC home  OT Goal Formulation With patient  Time For Goal Achievement 07/20/20  Potential to Achieve Goals Good  OT Time Calculation  OT Start Time (ACUTE ONLY) 1121  OT Stop Time (ACUTE ONLY) 1149  OT Time Calculation (min) 28 min  OT General Charges  $OT Visit 1 Visit  OT Evaluation  $OT Eval Low Complexity 1 Low  Written Expression  Dominant Hand Right   Delbert Phenix OT OT pager: [phone]

## 2020-07-06 NOTE — Consult Note (Addendum)
Chief Complaint: Patient was seen in consultation today for CT guided bone marrow biopsy  Referring Physician(s): [place]  Supervising Physician: Daryll Brod  Patient Status: [facility] - In-pt  History of Present Illness: Kathy Irwin is a 70 y.o. female with past medical history of anemia, CMML (under observation), diabetes, esophageal reflux, hyperlipidemia, hypertension, ILD admitted to [facility] on 3/21 secondary to worsening thrombocytopenia noted at medical oncology visit.  She was recently discharged from SNF after recent right hip fracture status post intramedullary nail placement with subsequent development of postoperative infection.  She was tentatively planned to have irrigation and debridement done last week by orthopedic surgery but was canceled secondary to thrombocytopenia and lack of available platelets for transfusion.  Current labs include WBC 4.2, hemoglobin 8.3, platelets 16k, creatinine 0.54, COVID-19 negative.  Request now received from oncology for CT-guided bone marrow biopsy for further evaluation.  Past Medical History:  Diagnosis Date  . Acute respiratory failure with hypoxia ([place]) 12/2018  . Anemia, unspecified   . CML (chronic myelocytic leukemia) ([place])   . Diabetes mellitus without complication ([place])   . Esophageal reflux   . Hyperlipemia   . Hypersensitivity pneumonitis ([place]) 12/19/2017  . Hypertension   . ILD (interstitial lung disease) ([place]) 12/24/2018    Past Surgical History:  Procedure Laterality Date  . ABDOMINAL HYSTERECTOMY    . INTRAMEDULLARY (IM) NAIL INTERTROCHANTERIC Right 04/30/2020   Procedure: INTRAMEDULLARY (IM) NAIL INTERTROCHANTRIC;  Surgeon: Shona Needles, MD;  Location: [place];  Service: Orthopedics;  Laterality: Right;  Marland Kitchen VIDEO BRONCHOSCOPY Bilateral 04/02/2019   Procedure: VIDEO BRONCHOSCOPY WITH FLUORO;  Surgeon: Marshell Garfinkel, MD;  Location: [facility];  Service: Cardiopulmonary;  Laterality: Bilateral;     Allergies: Other, Ativan [lorazepam], Iodine, Iodine i 131 tositumomab, Merbromin, and Tape  Medications: Prior to Admission medications   Medication Sig Start Date End Date Taking? Authorizing Provider  acetaminophen (TYLENOL) 500 MG tablet Take 1,000 mg by mouth at bedtime.   Yes [provider]  albuterol (VENTOLIN HFA) 108 (90 Base) MCG/ACT inhaler INHALE 1-2 PUFFS BY MOUTH EVERY 6 HOURS AS NEEDED FOR WHEEZE OR SHORTNESS OF BREATH Patient taking differently: Inhale 2 puffs into the lungs every 4 (four) hours as needed for wheezing or shortness of breath. 05/09/20  Yes Kerin Perna, NP  [employer] D3 125 MCG (5000 UT) capsule Take 5,000 Units by mouth daily. 07/16/19  Yes [provider]  famotidine (PEPCID) 40 MG tablet Take 1 tablet (40 mg total) by mouth daily. 12/02/19  Yes Pokhrel, Laxman, MD  fluticasone (FLONASE) 50 MCG/ACT nasal spray PLACE 2 SPRAYS INTO BOTH NOSTRILS DAILY AS NEEDED FOR ALLERGIES OR RHINITIS. 07/04/20  Yes Kerin Perna, NP  Fluticasone-Salmeterol (ADVAIR) 100-50 MCG/DOSE AEPB Inhale 1 puff into the lungs every 12 (twelve) hours. 07/02/20  Yes Delray Alt, PA-C  furosemide (LASIX) 20 MG tablet Take 1 pill in the morning Patient taking differently: Take 20 mg by mouth daily. 02/05/20  Yes Kerin Perna, NP  HYDROcodone-acetaminophen (NORCO) 7.5-325 MG tablet Take 1 tablet by mouth in the morning and at bedtime. Patient taking differently: Take 1 tablet by mouth every 6 (six) hours as needed for moderate pain. 07/02/20  Yes Delray Alt, PA-C  insulin glargine (LANTUS) 100 UNIT/ML injection Inject 10 Units into the skin daily.   Yes [provider]  linaclotide (LINZESS) 145 MCG CAPS capsule Take 1 capsule (145 mcg total) by mouth daily before breakfast. Patient taking differently: Take 145  mcg by mouth daily as needed (irritable bowel syndrome). 12/02/19  Yes Pokhrel, Corrie Mckusick, MD  metFORMIN (GLUCOPHAGE) 1000 MG tablet Take 1  tablet (1,000 mg total) by mouth 2 (two) times daily with a meal. 12/30/19  Yes Kerin Perna, NP  metoprolol succinate (TOPROL-XL) 25 MG 24 hr tablet Take 1 tablet (25 mg total) by mouth daily. Patient taking differently: Take 25 mg by mouth daily. Takes along 50 mg to total 75 mg 02/05/20  Yes Kerin Perna, NP  metoprolol succinate (TOPROL-XL) 50 MG 24 hr tablet TAKE 1 TABLET BY MOUTH EVERY DAY IN THE MORNING Patient taking differently: Take 50 mg by mouth daily. Takes along with 25 mg to total 75 mg 04/07/20  Yes Juluis Mire P, NP  montelukast (SINGULAIR) 10 MG tablet Take 10 mg by mouth at bedtime. 03/05/19  Yes [provider]  Multiple Vitamin (MULTIVITAMIN WITH MINERALS) TABS tablet Take 1 tablet by mouth daily. Patient taking differently: Take 1 tablet by mouth daily at 12 noon. 12/02/19  Yes Pokhrel, Laxman, MD  olopatadine (PATANOL) 0.1 % ophthalmic solution Place 1 drop into both eyes 2 (two) times daily. 07/24/19  Yes Wieters, Hallie C, PA-C  OXYGEN Inhale 2-3 L into the lungs See admin instructions. Takes 2 L during day time if needed and 3 L at night   Yes [provider]  pantoprazole (PROTONIX) 40 MG tablet Take 1 tablet (40 mg total) by mouth daily. 12/01/19  Yes Pokhrel, Laxman, MD  polyethylene glycol (MIRALAX / GLYCOLAX) 17 g packet Take 17 g by mouth daily as needed for mild constipation or moderate constipation. Patient taking differently: Take 17 g by mouth daily. 12/01/19  Yes Pokhrel, Laxman, MD  pravastatin (PRAVACHOL) 40 MG tablet Take 1 tablet (40 mg total) by mouth at bedtime. 12/30/19  Yes Kerin Perna, NP  pregabalin (LYRICA) 100 MG capsule Take 1 capsule (100 mg total) by mouth at bedtime. 02/05/20  Yes Kerin Perna, NP  sertraline (ZOLOFT) 100 MG tablet TAKE 1 TABLET BY MOUTH AT BEDTIME Patient taking differently: Take 100 mg by mouth at bedtime. 07/04/20  Yes Kerin Perna, NP  simethicone (MYLICON) 80 MG chewable  tablet Chew 1 tablet (80 mg total) by mouth 4 (four) times daily as needed for flatulence. 12/01/19  Yes Pokhrel, Laxman, MD  traMADol (ULTRAM) 50 MG tablet Take 1 tablet (50 mg total) by mouth every 6 (six) hours as needed for severe pain. 05/07/20  Yes Delray Alt, PA-C  traZODone (DESYREL) 50 MG tablet Take 100 mg by mouth at bedtime.   Yes [provider]  blood glucose meter kit and supplies KIT Dispense based on patient and insurance preference. Use up to four times daily as directed. (FOR ICD-9 250.00, 250.01). For QAC - HS accuchecks. 11/22/18   Thurnell Lose, MD  blood glucose meter kit and supplies Dispense based on patient and insurance preference. Use up to four times daily as directed. (FOR ICD-10 E10.9, E11.9). 12/30/19   Kerin Perna, NP  Ensure Max Protein (ENSURE MAX PROTEIN) LIQD Take 330 mLs (11 oz total) by mouth 3 (three) times daily. Patient not taking: No sig reported 12/01/19   Pokhrel, Corrie Mckusick, MD  glucose blood (FREESTYLE LITE) test strip For glucose testing every before meals at bedtime. Diagnosis E 11.65  Can substitute to any accepted brand 11/22/18   Thurnell Lose, MD  methocarbamol (ROBAXIN) 500 MG tablet Take 1 tablet (500 mg total) by mouth every  8 (eight) hours as needed for muscle spasms. Patient not taking: No sig reported 02/05/20   Kerin Perna, NP  ondansetron (ZOFRAN) 4 MG tablet Take 1 tablet (4 mg total) by mouth every 8 (eight) hours as needed for nausea or vomiting. Patient not taking: No sig reported 08/15/19   Rancour, Annie Main, MD  senna-docusate (SENOKOT-S) 8.6-50 MG tablet Take 1 tablet by mouth 2 (two) times daily. Patient not taking: No sig reported 11/24/19   Domenic Polite, MD  sodium chloride (OCEAN) 0.65 % SOLN nasal spray Place 1 spray into both nostrils as needed for congestion. Patient not taking: No sig reported 08/12/19   Dessa Phi, DO     Family History  Problem Relation Age of Onset  . Diabetes Other   .  Hypertension Other     Social History   Socioeconomic History  . Marital status: Married    Spouse name: Not on file  . Number of children: Not on file  . Years of education: Not on file  . Highest education level: Not on file  Occupational History  . Not on file  Tobacco Use  . Smoking status: Never Smoker  . Smokeless tobacco: Never Used  Vaping Use  . Vaping Use: Never used  Substance and Sexual Activity  . Alcohol use: Never  . Drug use: Never  . Sexual activity: Not on file  Other Topics Concern  . Not on file  Social History Narrative  . Not on file   Social Determinants of Health   Financial Resource Strain: Not on file  Food Insecurity: Not on file  Transportation Needs: Not on file  Physical Activity: Not on file  Stress: Not on file  Social Connections: Not on file      Review of Systems currently denies fever, chest pain, worsening dyspnea, cough, abdominal/back pain, nausea, vomiting or bleeding.  She does have headache and some right hip discomfort.  Vital Signs: BP 134/61   Pulse (!) 105   Temp 98.3 F (36.8 C) (Oral)   Resp 16   Ht _0  (1.626 m)   Wt 240 lb (108.9 kg)   SpO2 93%   BMI 41.20 kg/m   Physical Exam awake, alert.  Chest clear to auscultation bilaterally.  Heart with slightly tachycardic but regular rhythm.  Positive murmur.  Abdomen soft, positive bowel sounds, nontender.  No significant lower extremity edema.  Imaging: CT HIP RIGHT WO CONTRAST  Result Date: 06/23/2020 CLINICAL DATA:  Recent right hip ORIF for intertrochanteric fracture presenting with low platelets and anemia. EXAM: CT OF THE RIGHT HIP WITHOUT CONTRAST TECHNIQUE: Multidetector CT imaging of the right hip was performed according to the standard protocol. Multiplanar CT image reconstructions were also generated. COMPARISON:  Radiographs 04/30/2020 FINDINGS: Bones/Joint/Cartilage Redemonstration of the a highly comminuted inter trochanteric right femur fracture post  intramedullary nail placement with transcervical pinning. There is some developing callus formation about the numerous fracture fragments. Some low-intermediate attenuation surrounding the fracture fragments likely reflects a small amount of intramedullary hematoma with additional seroma in stranding extending to the skin surface of the lateral soft tissues of hip. No acute hardware complication or failure is seen otherwise. There is a background of severe degenerative changes in the left hip including larger geode formation along the articular surface of the femoral head as well as extensive periarticular spurring about the acetabulum. No acute fracture of the included bones of the pelvis is seen. Ligaments Suboptimally assessed by CT. Muscles and Tendons Intramuscular  stranding and thickening a seen along the surgical approach the patient's intramedullary nail placement. Small of intramuscular thickening and possible hematoma/seroma subjacent to the iliotibial band. No frankly or retracted tendons nor other acute musculotendinous injury. Soft tissues Soft tissue swelling and edematous changes of the right lower extremity most pronounced laterally with scar stranding and scarring related to the surgical approach. No soft tissue gas or unexpected foreign body is seen. Included portions of the pelvis are free of acute abnormality. IMPRESSION: 1. Redemonstration of the highly comminuted inter trochanteric right femur fracture post intramedullary nail placement with transcervical pinning. Developing callus formation about the numerous fracture fragments. Some minimal low-intermediate attenuation surrounding the fracture fragments likely reflects only a small amount residual hematoma or seroma with additional scarring and thickening extending to the skin surface of the lateral soft tissues of the hip. 2. No other acute osseous injury or traumatic malalignment. No other large collection or hematoma is seen. 3. Background  of severe degenerative changes in the left hip. Electronically Signed   By: Lovena Le M.D.   On: 06/23/2020 22:21    Labs:  CBC: Recent Labs    06/23/20 1854 07/02/20 0625 07/05/20 1442 07/06/20 0632  WBC 7.3 5.7 4.9 4.2  HGB 8.0* 8.1* 7.9* 8.3*  HCT 27.2* 27.7* 26.8* 27.1*  PLT 19* 44* 17* 16*    COAGS: Recent Labs    11/10/19 2152 04/29/20 1205  INR 1.3* 1.3*    BMP: Recent Labs    11/27/19 0905 11/28/19 0643 11/30/19 0344 12/01/19 0310 01/21/20 1147 05/10/20 0117 06/23/20 1854 07/05/20 1442 07/06/20 0632  NA 142 142 141 140   < > 135 140 139 138  K 4.3 3.8 3.8 3.6   < > 4.7 4.0 4.2 3.8  CL 107 107 106 104   < > 100 105 106 105  CO2 _0 < > _1 GLUCOSE 216* 146* 148* 151*   < > 296* 83 145* 113*  BUN 6* _2 < > _3 CALCIUM 9.4 9.3 9.3 9.6   < > 9.6 9.6 9.6 9.2  CREATININE 0.71 0.69 0.66 0.81   < > 0.70 0.81 0.68 0.54  GFRNONAA >60 >60 >60 >60   < > >60 >60 >60 >60  GFRAA >60 >60 >60 >60  --   --   --   --   --    < > = values in this interval not displayed.    LIVER FUNCTION TESTS: Recent Labs    05/05/20 0257 06/23/20 1854 07/05/20 1442 07/05/20 2032 07/06/20 0632  BILITOT 1.3* 1.9* 2.3* 2.1* 2.5*  AST 43* 22 13*  --  13*  ALT _4 --  10  ALKPHOS 51 101 71  --  60  PROT 5.4* 6.7 6.6  --  5.8*  ALBUMIN 2.1* 3.4* 3.5  --  3.3*    TUMOR MARKERS: No results for input(s): AFPTM, CEA, CA199, CHROMGRNA in the last 8760 hours.  Assessment and Plan: 70 y.o. female with past medical history of anemia, CMML (under observation), diabetes, esophageal reflux, hyperlipidemia, hypertension, ILD admitted to [facility] on 3/21 secondary to worsening thrombocytopenia noted at medical oncology visit.  She was recently discharged from SNF after recent right hip fracture status post intramedullary nail placement with subsequent development of postoperative infection.  She was tentatively planned to have irrigation and  debridement done last week by orthopedic  surgery but was canceled secondary to thrombocytopenia and lack of available platelets for transfusion.  Current labs include WBC 4.2, hemoglobin 8.3, platelets 16k, creatinine 0.54, COVID-19 negative.  Request now received from oncology for CT-guided bone marrow biopsy for further evaluation.Risks and benefits of procedure was discussed with the patient  including, but not limited to bleeding, infection, damage to adjacent structures or low yield requiring additional tests.  All of the questions were answered and there is agreement to proceed.  Consent signed and in chart.  Procedure scheduled for 3/23 AM   Thank you for this interesting consult.  I greatly enjoyed meeting [facility] and look forward to participating in their care.  A copy of this report was sent to the requesting provider on this date.  Electronically Signed: D. Rowe Robert, PA-C 07/06/2020, 11:39 AM   I spent a total of  20 minutes   in face to face in clinical consultation, greater than 50% of which was counseling/coordinating care for CT-guided bone marrow biopsy

## 2020-07-06 NOTE — Consult Note (Signed)
[facility]  Total days of antibiotics 0               Reason for Consult:right hip wound     Referring Physician: nettey  Principal Problem:   Thrombocytopenia ([place]) Active Problems:   Hypersensitivity pneumonitis (HCC)   Obesity, Class III, BMI 40-49.9 (morbid obesity) ([place])   Essential hypertension   Postoperative wound infection of right hip   Hyperbilirubinemia   Idiopathic thrombocytopenia (HCC)    HPI: Kathy Irwin is a 70 y.o. female  With history of HTN, CMML 1 and idiopathic thrombocytopenia, and hx of hypersensitivity pneumonitis - who sustained a right subtrochanteric femur fracture secondary to a fall at her workplace who underwent IM nailing by dr haddix on 1/14. Discharged on 1/25. She was given platelets and steroids due to thrombocytopenia during that hospitalization. She was discharged to rehab. She had difficulty with wound healing and had drainage for roughly 4 wks and had been placed on doxycycline during this time. Unclear if any cultures had been taken. She states the wound finally healed last week. No further having any drainage nor on any antibiotics. She did go to the ED on 3/9 for low platelets and underwent CT at that time which found small area of fluid collection about the femur. She was to undergo formal I X D on 3/16 but again her platelets were too low, and not enough blood products available to support her through surgery, thus it was cancelled. She is no longer on abtx. Readmitted for low platelets of 17K. Dr Lonny Prude asked id to weigh in to whether she needs further antibiotics. Patient states still sore to right hip but is learning to move around.  Past Medical History:  Diagnosis Date  . Acute respiratory failure with hypoxia ([place]) 12/2018  . Anemia, unspecified   . CML (chronic myelocytic leukemia) ([place])   . Diabetes mellitus without complication ([place])   . Esophageal reflux   . Hyperlipemia   . Hypersensitivity  pneumonitis ([place]) 12/19/2017  . Hypertension   . ILD (interstitial lung disease) ([place]) 12/24/2018    Allergies:  Allergies  Allergen Reactions  . Other Shortness Of Breath and Other (See Comments)    Newspaper ink =  new chest pain, also  . Ativan [Lorazepam] Other (See Comments)    Severe confusion and agitation.   . Iodine Other (See Comments)    Unknown reaction  . Iodine I 131 Tositumomab   . Merbromin Other (See Comments)    Unknown reaction - Mercurochrome- "Was a long time ago"   . Tape Rash      MEDICATIONS: . sodium chloride   Intravenous Once  . acetaminophen  650 mg Oral Once   And  . methylPREDNISolone (SOLU-MEDROL) injection  80 mg Intravenous Once   And  . diphenhydrAMINE  25 mg Oral Once  . cholecalciferol  5,000 Units Oral Daily  . famotidine  40 mg Oral Daily  . furosemide  20 mg Oral Daily  . insulin aspart  0-15 Units Subcutaneous TID WC  . insulin glargine  10 Units Subcutaneous Daily  . metoprolol succinate  75 mg Oral Daily  . mometasone-formoterol  2 puff Inhalation BID  . montelukast  10 mg Oral QHS  . olopatadine  1 drop Both Eyes BID  . pantoprazole  40 mg Oral Daily  . pravastatin  40 mg Oral QHS  . pregabalin  100 mg Oral QHS  . romiPLOStim  2 mcg/kg Subcutaneous  Weekly  . sertraline  100 mg Oral QHS  . traZODone  100 mg Oral QHS    Social History   Tobacco Use  . Smoking status: Never Smoker  . Smokeless tobacco: Never Used  Vaping Use  . Vaping Use: Never used  Substance Use Topics  . Alcohol use: Never  . Drug use: Never    Family History  Problem Relation Age of Onset  . Diabetes Other   . Hypertension Other      Review of Systems  Constitutional: Negative for fever, chills, diaphoresis, activity change, appetite change, fatigue and unexpected weight change.  HENT: Negative for congestion, sore throat, rhinorrhea, sneezing, trouble swallowing and sinus pressure.  Eyes: Negative for photophobia and visual disturbance.   Respiratory: Negative for cough, chest tightness, shortness of breath, wheezing and stridor.  Cardiovascular: Negative for chest pain, palpitations and leg swelling.  Gastrointestinal: Negative for nausea, vomiting, abdominal pain, diarrhea, constipation, blood in stool, abdominal distention and anal bleeding.  Genitourinary: Negative for dysuria, hematuria, flank pain and difficulty urinating.  Musculoskeletal: Negative for myalgias, back pain, joint swelling, arthralgias and gait problem.  Skin: +easy bruising. Negative for color change, pallor, rash and wound.  Neurological: Negative for dizziness, tremors, weakness and light-headedness.  Hematological: Negative for adenopathy. Does not bruise/bleed easily.  Psychiatric/Behavioral: Negative for behavioral problems, confusion, sleep disturbance, dysphoric mood, decreased concentration and agitation.     OBJECTIVE: Temp:  [97.9 F (36.6 C)-99.3 F (37.4 C)] 99.3 F (37.4 C) (03/22 1438) Pulse Rate:  [67-121] 88 (03/22 1438) Resp:  [16-18] 16 (03/22 1438) BP: (91-136)/(45-64) 136/52 (03/22 1438) SpO2:  [77 %-100 %] 96 % (03/22 1438) Weight:  [108.9 kg] 108.9 kg (03/21 2000) Physical Exam  Constitutional:  oriented to person, place, and time. appears well-developed and well-nourished. No distress.  HENT: [place]/AT, PERRLA, no scleral icterus Mouth/Throat: Oropharynx is clear and moist. No oropharyngeal exudate.  Cardiovascular: Normal rate, regular rhythm and normal heart sounds. Exam reveals no gallop and no friction rub.  No murmur heard.  Pulmonary/Chest: Effort normal and breath sounds normal. No respiratory distress.  has no wheezes.  Neck = supple, no nuchal rigidity Abdominal: Soft. Bowel sounds are normal.  exhibits no distension. There is no tenderness.  Skin = right hip healed incision to firm induration, not tender, no fluctuance, no erythema or warmth. Lymphadenopathy: no cervical adenopathy. No axillary  adenopathy Neurological: alert and oriented to person, place, and time.  Psychiatric: a normal mood and affect.  behavior is normal.    LABS: Results for orders placed or performed during the hospital encounter of 07/05/20 (from the past 48 hour(s))  Resp Panel by RT-PCR (Flu A&B, Covid) Nasopharyngeal Swab     Status: None   Collection Time: 07/05/20  5:06 PM   Specimen: Nasopharyngeal Swab; Nasopharyngeal(NP) swabs in vial transport medium  Result Value Ref Range   SARS Coronavirus 2 by RT PCR NEGATIVE NEGATIVE    Comment: (NOTE) SARS-CoV-2 target nucleic acids are NOT DETECTED.  The SARS-CoV-2 RNA is generally detectable in upper respiratory specimens during the acute phase of infection. The lowest concentration of SARS-CoV-2 viral copies this assay can detect is 138 copies/mL. A negative result does not preclude SARS-Cov-2 infection and should not be used as the sole basis for treatment or other patient management decisions. A negative result may occur with  improper specimen collection/handling, submission of specimen other than nasopharyngeal swab, presence of viral mutation(s) within the areas targeted by this assay, and inadequate number of  viral copies(<138 copies/mL). A negative result must be combined with clinical observations, patient history, and epidemiological information. The expected result is Negative.  Fact Sheet for Patients:  EntrepreneurPulse.com.au  Fact Sheet for Healthcare Providers:  IncredibleEmployment.be  This test is no t yet approved or cleared by the [place] FDA and  has been authorized for detection and/or diagnosis of SARS-CoV-2 by FDA under an Emergency Use Authorization (EUA). This EUA will remain  in effect (meaning this test can be used) for the duration of the COVID-19 declaration under Section 564(b)(1) of the Act, 21 U.S.C.section 360bbb-3(b)(1), unless the authorization is terminated  or  revoked sooner.       Influenza A by PCR NEGATIVE NEGATIVE   Influenza B by PCR NEGATIVE NEGATIVE    Comment: (NOTE) The Xpert Xpress SARS-CoV-2/FLU/RSV plus assay is intended as an aid in the diagnosis of influenza from Nasopharyngeal swab specimens and should not be used as a sole basis for treatment. Nasal washings and aspirates are unacceptable for Xpert Xpress SARS-CoV-2/FLU/RSV testing.  Fact Sheet for Patients: EntrepreneurPulse.com.au  Fact Sheet for Healthcare Providers: IncredibleEmployment.be  This test is not yet approved or cleared by the [place] FDA and has been authorized for detection and/or diagnosis of SARS-CoV-2 by FDA under an Emergency Use Authorization (EUA). This EUA will remain in effect (meaning this test can be used) for the duration of the COVID-19 declaration under Section 564(b)(1) of the Act, 21 U.S.C. section 360bbb-3(b)(1), unless the authorization is terminated or revoked.  Performed at [facility], [place] [address] [place], [place] [address]   Type and screen [place]     Status: None   Collection Time: 07/05/20  6:21 PM  Result Value Ref Range   ABO/RH(D) AB POS    Antibody Screen NEG    Sample Expiration 07/08/2020,2359    Unit Number [id number]    Blood Component Type RED CELLS,LR    Unit division 00    Status of Unit ISSUED,FINAL    Transfusion Status OK TO TRANSFUSE    Crossmatch Result      Compatible Performed at [facility], [place] [address] [place], [place] [address]   Prepare RBC (crossmatch)     Status: None   Collection Time: 07/05/20  6:21 PM  Result Value Ref Range   Order Confirmation      ORDER PROCESSED BY BLOOD BANK Performed at [facility], [place] [address], [place], [place] [address]   Bilirubin, fractionated(tot/dir/indir)     Status: Abnormal   Collection Time: 07/05/20  8:32 PM  Result  Value Ref Range   Total Bilirubin 2.1 (H) 0.3 - 1.2 mg/dL   Bilirubin, Direct 0.6 (H) 0.0 - 0.2 mg/dL   Indirect Bilirubin 1.5 (H) 0.3 - 0.9 mg/dL    Comment: Performed at [facility], [place] [address] [place], [place] [address]  Glucose, capillary     Status: Abnormal   Collection Time: 07/05/20  9:16 PM  Result Value Ref Range   Glucose-Capillary 153 (H) 70 - 99 mg/dL    Comment: Glucose reference range applies only to samples taken after fasting for at least 8 hours.   Comment 1 Notify RN    Comment 2 Document in Chart   Comprehensive metabolic panel     Status: Abnormal   Collection Time: 07/06/20  6:32 AM  Result Value Ref Range   Sodium 138 135 - 145 mmol/L   Potassium 3.8 3.5 -  5.1 mmol/L   Chloride 105 98 - 111 mmol/L   CO2 27 22 - 32 mmol/L   Glucose, Bld 113 (H) 70 - 99 mg/dL    Comment: Glucose reference range applies only to samples taken after fasting for at least 8 hours.   BUN 8 8 - 23 mg/dL   Creatinine, Ser 0.54 0.44 - 1.00 mg/dL   Calcium 9.2 8.9 - 10.3 mg/dL   Total Protein 5.8 (L) 6.5 - 8.1 g/dL   Albumin 3.3 (L) 3.5 - 5.0 g/dL   AST 13 (L) 15 - 41 U/L   ALT 10 0 - 44 U/L   Alkaline Phosphatase 60 38 - 126 U/L   Total Bilirubin 2.5 (H) 0.3 - 1.2 mg/dL   GFR, Estimated >60 >60 mL/min    Comment: (NOTE) Calculated using the CKD-EPI Creatinine Equation (2021)    Anion gap 6 5 - 15    Comment: Performed at [facility], [place] [address] [place], [place] [address]  CBC     Status: Abnormal   Collection Time: 07/06/20  6:32 AM  Result Value Ref Range   WBC 4.2 4.0 - 10.5 K/uL   RBC 2.67 (L) 3.87 - 5.11 MIL/uL   Hemoglobin 8.3 (L) 12.0 - 15.0 g/dL   HCT 27.1 (L) 36.0 - 46.0 %   MCV 101.5 (H) 80.0 - 100.0 fL   MCH 31.1 26.0 - 34.0 pg   MCHC 30.6 30.0 - 36.0 g/dL   RDW 23.4 (H) 11.5 - 15.5 %   Platelets 16 (LL) 150 - 400 K/uL    Comment: SPECIMEN CHECKED FOR CLOTS Immature Platelet Fraction may be clinically indicated,  consider ordering this additional test [id number] REPEATED TO VERIFY PLATELET COUNT CONFIRMED BY SMEAR    nRBC 8.8 (H) 0.0 - 0.2 %    Comment: Performed at [facility], [place] [address] [place], [place] [address]  Glucose, capillary     Status: Abnormal   Collection Time: 07/06/20  8:14 AM  Result Value Ref Range   Glucose-Capillary 110 (H) 70 - 99 mg/dL    Comment: Glucose reference range applies only to samples taken after fasting for at least 8 hours.   Comment 1 Notify RN    Comment 2 Document in Chart   Glucose, capillary     Status: Abnormal   Collection Time: 07/06/20 12:09 PM  Result Value Ref Range   Glucose-Capillary 223 (H) 70 - 99 mg/dL    Comment: Glucose reference range applies only to samples taken after fasting for at least 8 hours.   Comment 1 Notify RN    Comment 2 Document in Chart   Sedimentation rate     Status: None   Collection Time: 07/06/20  2:00 PM  Result Value Ref Range   Sed Rate 14 0 - 22 mm/hr    Comment: Performed at [facility], [place] [address], [place],  [address]  C-reactive protein     Status: Abnormal   Collection Time: 07/06/20  2:00 PM  Result Value Ref Range   CRP 10.6 (H) <1.0 mg/dL    Comment: Performed at [facility], [place] [address], [place],  [address]  Glucose, capillary     Status: Abnormal   Collection Time: 07/06/20  3:46 PM  Result Value Ref Range   Glucose-Capillary 154 (H) 70 - 99 mg/dL    Comment: Glucose reference range applies only to samples taken after fasting for at least 8  hours.   Comment 1 Notify RN    Comment 2 Document in Chart     MICRO: reviewed IMAGING: No results found.   Assessment/Plan:  70 yo F with right femur fracture in mid January with IM nailing c/b poor wound healing/drainage treated for superficial infection with doxycycline x 4 wk. Inflammatory markers normalized for the time being. Admitted for CMML and working up for  thrombocytopenia.  - recommend to repeat CT to see if the fluid collection is improved or worsened. If worsened, may see if IR can aspirate and send for culture to determine if needs further abtx (As well as surgery)  Thrombocytopenia = may need to coordinate imaging with her work up, BM aspirate  For now clinically stable, would not re-introduce abtx at this time.

## 2020-07-06 NOTE — Evaluation (Signed)
Physical Therapy Evaluation Patient Details Name: Kathy Irwin MRN: [id number] DOB: 16-Jun-1950 Today's Date: 07/06/2020   History of Present Illness  70 y.o. female with medical history significant of chronic myelomonocytic leukemia, chronic diastolic heart failure, chronic anemia, diabetes mellitus type 2, hyperlipidemia, hypersensitivity pneumonitis, interstitial lung disease, GERD, thrombocytopenia. Recent hip fracture in january 2022.  Clinical Impression  Pt admitted with above diagnosis. Pt ambulated 63' with RW, distance limited by R hip pain and by 3/4 dyspnea, SaO2 77% on room air, HR 121 with walking. Applied 2L O2, SaO2 came up to 94% with seated rest. Pt reports she has 4 children that can assist her at home PRN. Pt currently with functional limitations due to the deficits listed below (see PT Problem List). Pt will benefit from skilled PT to increase their independence and safety with mobility to allow discharge to the venue listed below.       Follow Up Recommendations Home health PT    Equipment Recommendations  None recommended by PT    Recommendations for Other Services       Precautions / Restrictions Precautions Precautions: Fall Restrictions Weight Bearing Restrictions: No      Mobility  Bed Mobility Overal bed mobility: Modified Independent             General bed mobility comments: used rails, HOB up    Transfers Overall transfer level: Needs assistance Equipment used: Rolling walker (2 wheeled) Transfers: Sit to/from Stand Sit to Stand: Min guard         General transfer comment: VCs hand placement, uses trunk momentum to rise  Ambulation/Gait Ambulation/Gait assistance: Min guard Gait Distance (Feet): 35 Feet Assistive device: Rolling walker (2 wheeled) Gait Pattern/deviations: Step-to pattern;Decreased weight shift to right;Decreased step length - right;Decreased step length - left Gait velocity: WFL   General Gait Details: distance  limited by pain and fatigue, 3/4 dyspnea with walking, SaO2 77% on room air and HR 121 immediately after walking, applied 2L O2 and SaO2 was 94% with seated rest  Stairs            Wheelchair Mobility    Modified Rankin (Stroke Patients Only)       Balance Overall balance assessment: Needs assistance   Sitting balance-Leahy Scale: Good     Standing balance support: Bilateral upper extremity supported Standing balance-Leahy Scale: Poor                               Pertinent Vitals/Pain Pain Assessment: 0-10 Pain Score: 5  Pain Location: R hip; perineal area feels "raw" Pain Descriptors / Indicators: Sore Pain Intervention(s): Limited activity within patient's tolerance;Monitored during session;Premedicated before session    Home Living Family/patient expects to be discharged to:: Private residence Living Arrangements: Alone Available Help at Discharge: Available PRN/intermittently;Family, 4 children can assist PRN Type of Home: House Home Access: Stairs to enter Entrance Stairs-Rails: Psychiatric nurse of Steps: 3 Home Layout: One level Home Equipment: Bedside commode;Walker - 4 wheels;Grab bars - toilet;Grab bars - tub/shower;Shower seat Additional Comments: 2-3 L home O2 prn at baseline    Prior Function Level of Independence: Independent with assistive device(s)         Comments: mobility independently with rollator since DC from SNF a few days prior to this admission. 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     Hand Dominance   Dominant Hand: Right  Extremity/Trunk Assessment   Upper Extremity Assessment Upper Extremity Assessment: Defer to OT evaluation    Lower Extremity Assessment Lower Extremity Assessment: RLE deficits/detail RLE Deficits / Details: knee ext -4/5 RLE Sensation: WNL RLE Coordination: WNL    Cervical / Trunk Assessment Cervical / Trunk Assessment: Normal   Communication   Communication: No difficulties  Cognition Arousal/Alertness: Awake/alert Behavior During Therapy: WFL for tasks assessed/performed Overall Cognitive Status: Within Functional Limits for tasks assessed                                        General Comments      Exercises     Assessment/Plan    PT Assessment Patient needs continued PT services  PT Problem List Decreased activity tolerance;Decreased strength;Decreased mobility;Decreased balance;Decreased knowledge of use of DME;Pain;Cardiopulmonary status limiting activity       PT Treatment Interventions Therapeutic activities;Therapeutic exercise;Gait training;Stair training;Patient/family education    PT Goals (Current goals can be found in the Care Plan section)  Acute Rehab PT Goals Patient Stated Goal: DC home PT Goal Formulation: With patient Time For Goal Achievement: 07/20/20 Potential to Achieve Goals: Fair    Frequency Min 3X/week   Barriers to discharge        Co-evaluation PT/OT/SLP Co-Evaluation/Treatment: Yes Reason for Co-Treatment: To address functional/ADL transfers PT goals addressed during session: Mobility/safety with mobility;Balance;Proper use of DME;Strengthening/ROM         AM-PAC PT "6 Clicks" Mobility  Outcome Measure Help needed turning from your back to your side while in a flat bed without using bedrails?: A Little Help needed moving from lying on your back to sitting on the side of a flat bed without using bedrails?: A Little Help needed moving to and from a bed to a chair (including a wheelchair)?: A Little Help needed standing up from a chair using your arms (e.g., wheelchair or bedside chair)?: A Little Help needed to walk in hospital room?: A Little Help needed climbing 3-5 steps with a railing? : A Little 6 Click Score: 18    End of Session Equipment Utilized During Treatment: Gait belt Activity Tolerance: Patient limited by fatigue Patient  left: in chair;with call bell/phone within reach Nurse Communication: Mobility status PT Visit Diagnosis: Difficulty in walking, not elsewhere classified (R26.2);Pain Pain - Right/Left: Right Pain - part of body: Hip    Time: 2202-5427 PT Time Calculation (min) (ACUTE ONLY): 28 min   Charges:   PT Evaluation $PT Eval Low Complexity: 1 Low          Blondell Reveal Kistler PT 07/06/2020  Acute Rehabilitation Services Pager [phone] Office [phone]

## 2020-07-06 NOTE — Progress Notes (Signed)
PROGRESS NOTE    Yunique Dearcos  DXI:[id number] DOB: 08-30-1950 DOA: 07/05/2020 PCP: Kerin Perna, NP   Brief Narrative: Kathy Irwin is a 70 y.o. female with medical history significant of chronic myelomonocytic leukemia, chronic diastolic heart failure, chronic anemia, diabetes mellitus type 2, hyperlipidemia, hypersensitivity pneumonitis, interstitial lung disease, GERD, thrombocytopenia. Patient presented from oncology office for acute on chronic low platelets of unknown significance.   Assessment & Plan:   Principal Problem:   Thrombocytopenia (HCC) Active Problems:   Hypersensitivity pneumonitis (HCC)   Obesity, Class III, BMI 40-49.9 (morbid obesity) ([place])   Essential hypertension   Postoperative wound infection of right hip   Hyperbilirubinemia   Idiopathic thrombocytopenia (HCC)   Thrombocytopenia Idiopathic thrombocytopenia Patient follows with Dr. Irene Limbo as an outpatient.  Platelets have been labile; baseline platelets appear to be around 50-60,000.  Platelets of 17,000 on admission, which is down from 44,00 three days prior.. No current evidence of bleeding. Discussed with on-call oncology who recommended transfusion for platelets less than 10k or if bleeding develops. Peripheral smear shows giant platelets. Patient was placed on aspirin per orthopedic surgery on discharge to SNF for hip fracture DVT prophylaxis. Platelets down minmally -Oncology recommendations: pending -Daily CBC  Chronic anemia Baseline hemoglobin appears to be around 8.  Hemoglobin of 7.9 on admission.  No evidence of bleeding.  Stable. Blood transfusion was ordered prior to admission and administered -Trend CBC  Right hip infection Post-op infection Patient suffered right hip fracture in January 2022.  She underwent intramedullary nail placement on January 14 and was sent to skilled nursing facility for rehabilitation.  She developed postop infection of her right hip with plans for  irrigation debridement by orthopedic surgery.  This was recently canceled secondary to significant thrombocytopenia and lack of availability of platelets.  Patient will need to follow-up with orthopedic surgery as an outpatient for rescheduled irrigation debridement. Aspirin discontinued. -Infectious disease consult for antibiotic/imaging recommendations -Blood cultures  Chronic respiratory failure with hypoxia Interstitial lung disease/hypersensitivity pneumonitis Patient uses 2 Lpm of oxygen at baseline, and 3 lpm at night. -Continue oxygen  Diabetes mellitus, type 2 Last hemoglobin A1C of 6.7%. Patient is on Lantus, metformin per unconfirmed medication history. -Continue Lantus 10 units daily and SSI  Hyperbilirubinemia Unknown etiology.  LDH of 299. Down slightly.  Chronic myelomonocytic leukemia Follows with medical oncology. Not on treatment.  Chronic diastolic heart failure Hypertension she is Patient is on Toprol-XL and Lasix as an outpatient -Continue Toprol-XL 75 mg daily and Lasix 20 mg daily  Hyperlipidemia Patient is on pravastatin as an outpatient. -Continue pravastatin  GERD On Protonix as an outpatient -Resume Protonix pending medication reconciliation  Morbid obesity Body mass index is 41.2 kg/m.   DVT prophylaxis: SCDs Code Status:   Code Status: Full Code Family Communication: None at bedside Disposition Plan: Discharge pending medical oncology recommendations   Consultants:   Medical oncology  Infectious disease  Procedures:   None  Antimicrobials:  None    Subjective: Some hip pain. Wondering what will be done about her platelets.  Objective: Vitals:   07/06/20 0012 07/06/20 0238 07/06/20 0355 07/06/20 0554  BP: (!) 104/46 (!) 91/45 99/64 (!) 100/47  Pulse: 68 67 79 80  Resp: _0 Temp: 98.3 F (36.8 C) 98.1 F (36.7 C) 97.9 F (36.6 C) 98.3 F (36.8 C)  TempSrc: Oral Oral Oral Oral  SpO2: 100% 100% 99% 96%   Weight:      Height:  Intake/Output Summary (Last 24 hours) at 07/06/2020 0723 Last data filed at 07/06/2020 0345 Gross per 24 hour  Intake 315 ml  Output --  Net 315 ml   Filed Weights   07/05/20 2000  Weight: 108.9 kg    Examination:  General exam: Appears calm and comfortable  Respiratory system: Clear to auscultation. Respiratory effort normal. Cardiovascular system: S1 & S2 heard, RRR. No murmurs, rubs, gallops or clicks. Gastrointestinal system: Abdomen is nondistended, soft and nontender. No organomegaly or masses felt. Normal bowel sounds heard. Central nervous system: Alert and oriented. No focal neurological deficits. Musculoskeletal: No edema. No calf tenderness Skin: No cyanosis. No rashes Psychiatry: Judgement and insight appear normal. Mood & affect appropriate.     Data Reviewed: I have personally reviewed following labs and imaging studies  CBC Lab Results  Component Value Date   WBC 4.9 07/05/2020   RBC 2.61 (L) 07/05/2020   HGB 7.9 (L) 07/05/2020   HCT 26.8 (L) 07/05/2020   MCV 102.7 (H) 07/05/2020   MCH 30.3 07/05/2020   PLT 17 (L) 07/05/2020   MCHC 29.5 (L) 07/05/2020   RDW 23.9 (H) 07/05/2020   LYMPHSABS 2.0 07/05/2020   MONOABS 1.3 (H) 07/05/2020   EOSABS 0.0 07/05/2020   BASOSABS 0.0 09/98/3382     Last metabolic panel Lab Results  Component Value Date   NA 139 07/05/2020   K 4.2 07/05/2020   CL 106 07/05/2020   CO2 25 07/05/2020   BUN 9 07/05/2020   CREATININE 0.68 07/05/2020   GLUCOSE 145 (H) 07/05/2020   GFRNONAA >60 07/05/2020   GFRAA >60 12/01/2019   CALCIUM 9.6 07/05/2020   PHOS 2.1 (L) 05/05/2020   PROT 6.6 07/05/2020   ALBUMIN 3.5 07/05/2020   LABGLOB 2.5 04/04/2019   AGRATIO 1.7 04/04/2019   BILITOT 2.1 (H) 07/05/2020   ALKPHOS 71 07/05/2020   AST 13 (L) 07/05/2020   ALT 10 07/05/2020   ANIONGAP 8 07/05/2020    CBG (last 3)  Recent Labs    07/05/20 2116  GLUCAP 153*     GFR: Estimated Creatinine  Clearance: 78.9 mL/min (by C-G formula based on SCr of 0.68 mg/dL).  Coagulation Profile: No results for input(s): INR, PROTIME in the last 168 hours.  Recent Results (from the past 240 hour(s))  SARS Coronavirus 2 by RT PCR (hospital order, performed in [facility] hospital lab) Nasopharyngeal Nasopharyngeal Swab     Status: None   Collection Time: 07/02/20  5:39 AM   Specimen: Nasopharyngeal Swab  Result Value Ref Range Status   SARS Coronavirus 2 NEGATIVE NEGATIVE Final    Comment: (NOTE) SARS-CoV-2 target nucleic acids are NOT DETECTED.  The SARS-CoV-2 RNA is generally detectable in upper and lower respiratory specimens during the acute phase of infection. The lowest concentration of SARS-CoV-2 viral copies this assay can detect is 250 copies / mL. A negative result does not preclude SARS-CoV-2 infection and should not be used as the sole basis for treatment or other patient management decisions.  A negative result may occur with improper specimen collection / handling, submission of specimen other than nasopharyngeal swab, presence of viral mutation(s) within the areas targeted by this assay, and inadequate number of viral copies (<250 copies / mL). A negative result must be combined with clinical observations, patient history, and epidemiological information.  Fact Sheet for Patients:   StrictlyIdeas.no  Fact Sheet for Healthcare Providers: BankingDealers.co.za  This test is not yet approved or  cleared by the [place] FDA  and has been authorized for detection and/or diagnosis of SARS-CoV-2 by FDA under an Emergency Use Authorization (EUA).  This EUA will remain in effect (meaning this test can be used) for the duration of the COVID-19 declaration under Section 564(b)(1) of the Act, 21 U.S.C. section 360bbb-3(b)(1), unless the authorization is terminated or revoked sooner.  Performed at [facility], [place]  [address], [place], [place] [address]   Resp Panel by RT-PCR (Flu A&B, Covid) Nasopharyngeal Swab     Status: None   Collection Time: 07/05/20  5:06 PM   Specimen: Nasopharyngeal Swab; Nasopharyngeal(NP) swabs in vial transport medium  Result Value Ref Range Status   SARS Coronavirus 2 by RT PCR NEGATIVE NEGATIVE Final    Comment: (NOTE) SARS-CoV-2 target nucleic acids are NOT DETECTED.  The SARS-CoV-2 RNA is generally detectable in upper respiratory specimens during the acute phase of infection. The lowest concentration of SARS-CoV-2 viral copies this assay can detect is 138 copies/mL. A negative result does not preclude SARS-Cov-2 infection and should not be used as the sole basis for treatment or other patient management decisions. A negative result may occur with  improper specimen collection/handling, submission of specimen other than nasopharyngeal swab, presence of viral mutation(s) within the areas targeted by this assay, and inadequate number of viral copies(<138 copies/mL). A negative result must be combined with clinical observations, patient history, and epidemiological information. The expected result is Negative.  Fact Sheet for Patients:  EntrepreneurPulse.com.au  Fact Sheet for Healthcare Providers:  IncredibleEmployment.be  This test is no t yet approved or cleared by the [place] FDA and  has been authorized for detection and/or diagnosis of SARS-CoV-2 by FDA under an Emergency Use Authorization (EUA). This EUA will remain  in effect (meaning this test can be used) for the duration of the COVID-19 declaration under Section 564(b)(1) of the Act, 21 U.S.C.section 360bbb-3(b)(1), unless the authorization is terminated  or revoked sooner.       Influenza A by PCR NEGATIVE NEGATIVE Final   Influenza B by PCR NEGATIVE NEGATIVE Final    Comment: (NOTE) The Xpert Xpress SARS-CoV-2/FLU/RSV plus assay is intended as an aid in the  diagnosis of influenza from Nasopharyngeal swab specimens and should not be used as a sole basis for treatment. Nasal washings and aspirates are unacceptable for Xpert Xpress SARS-CoV-2/FLU/RSV testing.  Fact Sheet for Patients: EntrepreneurPulse.com.au  Fact Sheet for Healthcare Providers: IncredibleEmployment.be  This test is not yet approved or cleared by the [place] FDA and has been authorized for detection and/or diagnosis of SARS-CoV-2 by FDA under an Emergency Use Authorization (EUA). This EUA will remain in effect (meaning this test can be used) for the duration of the COVID-19 declaration under Section 564(b)(1) of the Act, 21 U.S.C. section 360bbb-3(b)(1), unless the authorization is terminated or revoked.  Performed at [facility], [place] [address], [place], [place] [address]         Radiology Studies: No results found.      Scheduled Meds: . sodium chloride   Intravenous Once  . cholecalciferol  5,000 Units Oral Daily  . famotidine  40 mg Oral Daily  . furosemide  20 mg Oral Daily  . insulin aspart  0-15 Units Subcutaneous TID WC  . insulin glargine  10 Units Subcutaneous Daily  . metoprolol succinate  75 mg Oral Daily  . mometasone-formoterol  2 puff Inhalation BID  . montelukast  10 mg Oral QHS  . olopatadine  1 drop Both Eyes  BID  . pantoprazole  40 mg Oral Daily  . pravastatin  40 mg Oral QHS  . pregabalin  100 mg Oral QHS  . sertraline  100 mg Oral QHS  . traZODone  100 mg Oral QHS   Continuous Infusions:   LOS: 0 days     Cordelia Poche, MD [facility] 07/06/2020, 7:23 AM  If 7PM-7AM, please contact night-coverage [email or web address]

## 2020-07-06 NOTE — Telephone Encounter (Signed)
Checked out appointment. No LOS notes needing to be scheduled. No changes made.

## 2020-07-06 NOTE — Progress Notes (Addendum)
HEMATOLOGY-ONCOLOGY PROGRESS NOTE  SUBJECTIVE: Kathy Irwin was seen in our office yesterday.  She was having significant right hip pain at the prior surgical site.  Labs revealed a hemoglobin of 7.9 and platelet count of only 17,000.  She has received 1 unit PRBC so far this admission. This morning, she reports that she has not seen any evidence of bleeding.  She is still having significant right hip pain.  She has not noticed any drainage from this right hip but it feels hard and tight.  The patient tells me that she has felt feverish and cold intermittently.  She has not measured her temperature.  No fevers documented this admission.  REVIEW OF SYSTEMS:   Constitutional: Reports feeling feverish and cold Eyes: Denies blurriness of vision Ears, nose, mouth, throat, and face: Denies mucositis or sore throat Respiratory: Denies cough, dyspnea or wheezes Cardiovascular: Denies palpitation, chest discomfort Gastrointestinal:  Denies nausea, heartburn or change in bowel habits Skin: Denies abnormal skin rashes Lymphatics: Denies new lymphadenopathy or easy bruising Neurological:Denies numbness, tingling or new weaknesses Behavioral/Psych: Mood is stable, no new changes  Extremities: No lower extremity edema All other systems were reviewed with the patient and are negative.  I have reviewed the past medical history, past surgical history, social history and family history with the patient and they are unchanged from previous note.   PHYSICAL EXAMINATION: ECOG PERFORMANCE STATUS: 2 - Symptomatic, <50% confined to bed  Vitals:   07/06/20 1051 07/06/20 1145  BP: 134/61   Pulse: (!) 105 (!) 121  Resp:    Temp:    SpO2:  (!) 77%   Filed Weights   07/05/20 2000  Weight: 108.9 kg    Intake/Output from previous day: 03/21 0701 - 03/22 0700 In: 315 [Blood:315] Out: -   GENERAL:alert, no distress and comfortable SKIN: skin color, texture, turgor are normal, no rashes or significant  lesions EYES: normal, Conjunctiva are pink and non-injected, sclera clear OROPHARYNX:no exudate, no erythema and lips, buccal mucosa, and tongue normal  NECK: supple, thyroid normal size, non-tender, without nodularity LYMPH:  no palpable lymphadenopathy in the cervical, axillary or inguinal LUNGS: clear to auscultation and percussion with normal breathing effort HEART: regular rate & rhythm and no murmurs and no lower extremity edema ABDOMEN:abdomen soft, non-tender and normal bowel sounds Musculoskeletal:no cyanosis of digits and no clubbing  NEURO: alert & oriented x 3 with fluent speech, no focal motor/sensory deficits  LABORATORY DATA:  I have reviewed the data as listed CMP Latest Ref Rng & Units 07/06/2020 07/05/2020 07/05/2020  Glucose 70 - 99 mg/dL 113(H) 145(H) -  BUN 8 - 23 mg/dL 8 9 -  Creatinine 0.44 - 1.00 mg/dL 0.54 0.68 -  Sodium 135 - 145 mmol/L 138 139 -  Potassium 3.5 - 5.1 mmol/L 3.8 4.2 -  Chloride 98 - 111 mmol/L 105 106 -  CO2 22 - 32 mmol/L 27 25 -  Calcium 8.9 - 10.3 mg/dL 9.2 9.6 -  Total Protein 6.5 - 8.1 g/dL 5.8(L) 6.6 -  Total Bilirubin 0.3 - 1.2 mg/dL 2.5(H) 2.1(H) 2.3(H)  Alkaline Phos 38 - 126 U/L 60 71 -  AST 15 - 41 U/L 13(L) 13(L) -  ALT 0 - 44 U/L 10 10 -    Lab Results  Component Value Date   WBC 4.2 07/06/2020   HGB 8.3 (L) 07/06/2020   HCT 27.1 (L) 07/06/2020   MCV 101.5 (H) 07/06/2020   PLT 16 (LL) 07/06/2020   NEUTROABS 1.3 (L)  07/05/2020    CT HIP RIGHT WO CONTRAST  Result Date: 06/23/2020 CLINICAL DATA:  Recent right hip ORIF for intertrochanteric fracture presenting with low platelets and anemia. EXAM: CT OF THE RIGHT HIP WITHOUT CONTRAST TECHNIQUE: Multidetector CT imaging of the right hip was performed according to the standard protocol. Multiplanar CT image reconstructions were also generated. COMPARISON:  Radiographs 04/30/2020 FINDINGS: Bones/Joint/Cartilage Redemonstration of the a highly comminuted inter trochanteric right femur  fracture post intramedullary nail placement with transcervical pinning. There is some developing callus formation about the numerous fracture fragments. Some low-intermediate attenuation surrounding the fracture fragments likely reflects a small amount of intramedullary hematoma with additional seroma in stranding extending to the skin surface of the lateral soft tissues of hip. No acute hardware complication or failure is seen otherwise. There is a background of severe degenerative changes in the left hip including larger geode formation along the articular surface of the femoral head as well as extensive periarticular spurring about the acetabulum. No acute fracture of the included bones of the pelvis is seen. Ligaments Suboptimally assessed by CT. Muscles and Tendons Intramuscular stranding and thickening a seen along the surgical approach the patient's intramedullary nail placement. Small of intramuscular thickening and possible hematoma/seroma subjacent to the iliotibial band. No frankly or retracted tendons nor other acute musculotendinous injury. Soft tissues Soft tissue swelling and edematous changes of the right lower extremity most pronounced laterally with scar stranding and scarring related to the surgical approach. No soft tissue gas or unexpected foreign body is seen. Included portions of the pelvis are free of acute abnormality. IMPRESSION: 1. Redemonstration of the highly comminuted inter trochanteric right femur fracture post intramedullary nail placement with transcervical pinning. Developing callus formation about the numerous fracture fragments. Some minimal low-intermediate attenuation surrounding the fracture fragments likely reflects only a small amount residual hematoma or seroma with additional scarring and thickening extending to the skin surface of the lateral soft tissues of the hip. 2. No other acute osseous injury or traumatic malalignment. No other large collection or hematoma is seen.  3. Background of severe degenerative changes in the left hip. Electronically Signed   By: Lovena Le M.D.   On: 06/23/2020 22:21    ASSESSMENT AND PLAN: 1) CMML-1 2)Thrombocytopenia 3) anemia  PLAN:  -Labs from today reviewed.  She continues to have anemia and thrombocytopenia.  Hemoglobin has improved following 1 unit PRBCs.  Platelets are stable.  She is not bleeding. -Advised pt that Plt are low due to combination of antibiotics, wound drainage, CMML, and ITP. -Recommend PRBC transfusion for hemoglobin less than 8 and platelet transfusion for platelets less than 10,000 or active bleeding. -Recommend proceeding with IVIG and Nplate. -Bone marrow biopsy has also been ordered to determine where her CMML stands.  -No indication to begin treatment of CMML at this time. No lab or clinical evidence of significant progression.  -Discussed with hospitalist and due to the patient report of feeling feverish/chills, he will order cultures.  He also plans to discuss with ID to determine if any additional work-up or treatment are needed for her right hip.   LOS: 0 days   Mikey Bussing, DNP, AGPCNP-BC, AOCNP 07/06/20   ADDENDUM  .Patient was Personally and independently interviewed, examined and relevant elements of the history of present illness were reviewed in details and an assessment and plan was created. All elements of the patient's history of present illness , assessment and plan were discussed in details with Mikey Bussing, DNP, AGPCNP-BC, AOCNP. The  above documentation reflects our combined findings assessment and plan.  Sullivan Lone MD MS

## 2020-07-06 NOTE — Progress Notes (Signed)
Date and time results received: 07/06/20 ,0750   Test:plt Critical Value: plt 16  Name of Provider Notified:Dr Nettey  Orders Received? Or Actions Taken?: yes

## 2020-07-07 ENCOUNTER — Observation Stay (HOSPITAL_COMMUNITY): Payer: 59

## 2020-07-07 ENCOUNTER — Encounter (HOSPITAL_COMMUNITY): Payer: Self-pay | Admitting: Family Medicine

## 2020-07-07 DIAGNOSIS — T8149XA Infection following a procedure, other surgical site, initial encounter: Secondary | ICD-10-CM | POA: Diagnosis not present

## 2020-07-07 DIAGNOSIS — Z6841 Body Mass Index (BMI) 40.0 and over, adult: Secondary | ICD-10-CM | POA: Diagnosis not present

## 2020-07-07 DIAGNOSIS — Z7982 Long term (current) use of aspirin: Secondary | ICD-10-CM | POA: Diagnosis not present

## 2020-07-07 DIAGNOSIS — D6959 Other secondary thrombocytopenia: Secondary | ICD-10-CM | POA: Diagnosis present

## 2020-07-07 DIAGNOSIS — Z7189 Other specified counseling: Secondary | ICD-10-CM | POA: Diagnosis not present

## 2020-07-07 DIAGNOSIS — D539 Nutritional anemia, unspecified: Secondary | ICD-10-CM | POA: Diagnosis present

## 2020-07-07 DIAGNOSIS — Z20822 Contact with and (suspected) exposure to covid-19: Secondary | ICD-10-CM | POA: Diagnosis present

## 2020-07-07 DIAGNOSIS — D693 Immune thrombocytopenic purpura: Secondary | ICD-10-CM | POA: Diagnosis present

## 2020-07-07 DIAGNOSIS — I5033 Acute on chronic diastolic (congestive) heart failure: Secondary | ICD-10-CM | POA: Diagnosis not present

## 2020-07-07 DIAGNOSIS — R17 Unspecified jaundice: Secondary | ICD-10-CM | POA: Diagnosis present

## 2020-07-07 DIAGNOSIS — D696 Thrombocytopenia, unspecified: Secondary | ICD-10-CM | POA: Diagnosis present

## 2020-07-07 DIAGNOSIS — J849 Interstitial pulmonary disease, unspecified: Secondary | ICD-10-CM | POA: Diagnosis present

## 2020-07-07 DIAGNOSIS — K219 Gastro-esophageal reflux disease without esophagitis: Secondary | ICD-10-CM | POA: Diagnosis present

## 2020-07-07 DIAGNOSIS — M009 Pyogenic arthritis, unspecified: Secondary | ICD-10-CM | POA: Diagnosis present

## 2020-07-07 DIAGNOSIS — C9310 Chronic myelomonocytic leukemia not having achieved remission: Secondary | ICD-10-CM

## 2020-07-07 DIAGNOSIS — Z8249 Family history of ischemic heart disease and other diseases of the circulatory system: Secondary | ICD-10-CM | POA: Diagnosis not present

## 2020-07-07 DIAGNOSIS — Z833 Family history of diabetes mellitus: Secondary | ICD-10-CM | POA: Diagnosis not present

## 2020-07-07 DIAGNOSIS — Z90710 Acquired absence of both cervix and uterus: Secondary | ICD-10-CM | POA: Diagnosis not present

## 2020-07-07 DIAGNOSIS — J679 Hypersensitivity pneumonitis due to unspecified organic dust: Secondary | ICD-10-CM | POA: Diagnosis present

## 2020-07-07 DIAGNOSIS — Z7951 Long term (current) use of inhaled steroids: Secondary | ICD-10-CM | POA: Diagnosis not present

## 2020-07-07 DIAGNOSIS — E785 Hyperlipidemia, unspecified: Secondary | ICD-10-CM | POA: Diagnosis present

## 2020-07-07 DIAGNOSIS — Z794 Long term (current) use of insulin: Secondary | ICD-10-CM | POA: Diagnosis not present

## 2020-07-07 DIAGNOSIS — Z7984 Long term (current) use of oral hypoglycemic drugs: Secondary | ICD-10-CM | POA: Diagnosis not present

## 2020-07-07 DIAGNOSIS — Z79899 Other long term (current) drug therapy: Secondary | ICD-10-CM | POA: Diagnosis not present

## 2020-07-07 DIAGNOSIS — J9621 Acute and chronic respiratory failure with hypoxia: Secondary | ICD-10-CM | POA: Diagnosis not present

## 2020-07-07 DIAGNOSIS — Z888 Allergy status to other drugs, medicaments and biological substances status: Secondary | ICD-10-CM | POA: Diagnosis not present

## 2020-07-07 DIAGNOSIS — Z9981 Dependence on supplemental oxygen: Secondary | ICD-10-CM | POA: Diagnosis not present

## 2020-07-07 LAB — CBC WITH DIFFERENTIAL/PLATELET
Abs Immature Granulocytes: 0.45 10*3/uL — ABNORMAL HIGH (ref 0.00–0.07)
Basophils Absolute: 0 10*3/uL (ref 0.0–0.1)
Basophils Relative: 1 %
Eosinophils Absolute: 0 10*3/uL (ref 0.0–0.5)
Eosinophils Relative: 0 %
HCT: 28.5 % — ABNORMAL LOW (ref 36.0–46.0)
Hemoglobin: 8.5 g/dL — ABNORMAL LOW (ref 12.0–15.0)
Immature Granulocytes: 11 %
Lymphocytes Relative: 30 %
Lymphs Abs: 1.2 10*3/uL (ref 0.7–4.0)
MCH: 31.4 pg (ref 26.0–34.0)
MCHC: 29.8 g/dL — ABNORMAL LOW (ref 30.0–36.0)
MCV: 105.2 fL — ABNORMAL HIGH (ref 80.0–100.0)
Monocytes Absolute: 0.9 10*3/uL (ref 0.1–1.0)
Monocytes Relative: 22 %
Neutro Abs: 1.5 10*3/uL — ABNORMAL LOW (ref 1.7–7.7)
Neutrophils Relative %: 36 %
Platelets: 18 10*3/uL — CL (ref 150–400)
RBC: 2.71 MIL/uL — ABNORMAL LOW (ref 3.87–5.11)
RDW: 24.1 % — ABNORMAL HIGH (ref 11.5–15.5)
WBC: 4 10*3/uL (ref 4.0–10.5)
nRBC: 10.5 % — ABNORMAL HIGH (ref 0.0–0.2)

## 2020-07-07 LAB — GLUCOSE, CAPILLARY
Glucose-Capillary: 231 mg/dL — ABNORMAL HIGH (ref 70–99)
Glucose-Capillary: 186 mg/dL — ABNORMAL HIGH (ref 70–99)
Glucose-Capillary: 137 mg/dL — ABNORMAL HIGH (ref 70–99)
Glucose-Capillary: 190 mg/dL — ABNORMAL HIGH (ref 70–99)

## 2020-07-07 MED ORDER — FENTANYL CITRATE (PF) 100 MCG/2ML IJ SOLN
INTRAMUSCULAR | Status: AC | PRN
  Administered 2020-07-07 (×2): 50 ug via INTRAVENOUS

## 2020-07-07 MED ORDER — NALOXONE HCL 0.4 MG/ML IJ SOLN
INTRAMUSCULAR | Status: AC
  Filled 2020-07-07: qty 1

## 2020-07-07 MED ORDER — FLUMAZENIL 0.5 MG/5ML IV SOLN
INTRAVENOUS | Status: AC
  Filled 2020-07-07: qty 5

## 2020-07-07 MED ORDER — MIDAZOLAM HCL 2 MG/2ML IJ SOLN
INTRAMUSCULAR | Status: AC | PRN
  Administered 2020-07-07 (×2): 1 mg via INTRAVENOUS

## 2020-07-07 MED ORDER — MIDAZOLAM HCL 2 MG/2ML IJ SOLN
INTRAMUSCULAR | Status: AC
  Filled 2020-07-07: qty 4

## 2020-07-07 MED ORDER — FENTANYL CITRATE (PF) 100 MCG/2ML IJ SOLN
INTRAMUSCULAR | Status: AC
  Filled 2020-07-07: qty 2

## 2020-07-07 MED ORDER — IOHEXOL 300 MG/ML  SOLN
100.0000 mL | Freq: Once | INTRAMUSCULAR | Status: AC | PRN
  Administered 2020-07-07: 100 mL via INTRAVENOUS

## 2020-07-07 MED ORDER — LIDOCAINE HCL (PF) 1 % IJ SOLN
INTRAMUSCULAR | Status: AC | PRN
  Administered 2020-07-07: 10 mL

## 2020-07-07 NOTE — Progress Notes (Signed)
[facility]    Date of Admission:  07/05/2020   Total days of antibiotics            ID: Kathy Irwin is a 70 y.o. female with   Principal Problem:   Thrombocytopenia (HCC) Active Problems:   Hypersensitivity pneumonitis (HCC)   Obesity, Class III, BMI 40-49.9 (morbid obesity) ([place])   Essential hypertension   Postoperative wound infection of right hip   Hyperbilirubinemia   Idiopathic thrombocytopenia (HCC)   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HCC)    Subjective: Afebrile. Underwent repeat imaging of hip  Medications:  . sodium chloride   Intravenous Once  . cholecalciferol  5,000 Units Oral Daily  . famotidine  40 mg Oral Daily  . fentaNYL      . furosemide  20 mg Oral Daily  . insulin aspart  0-15 Units Subcutaneous TID WC  . insulin glargine  10 Units Subcutaneous Daily  . metoprolol succinate  75 mg Oral Daily  . midazolam      . mometasone-formoterol  2 puff Inhalation BID  . montelukast  10 mg Oral QHS  . olopatadine  1 drop Both Eyes BID  . pantoprazole  40 mg Oral Daily  . pravastatin  40 mg Oral QHS  . pregabalin  100 mg Oral QHS  . romiPLOStim  2 mcg/kg Subcutaneous Weekly  . sertraline  100 mg Oral QHS  . traZODone  100 mg Oral QHS    Objective: Vital signs in last 24 hours: Temp:  [97.5 F (36.4 C)-99 F (37.2 C)] 97.6 F (36.4 C) (03/23 1346) Pulse Rate:  [72-93] 87 (03/23 1413) Resp:  [14-20] 18 (03/23 1500) BP: (102-137)/(47-80) 104/56 (03/23 1500) SpO2:  [82 %-100 %] 98 % (03/23 1500)   Physical Exam  Constitutional:  oriented to person, place, and time. appears well-developed and well-nourished. No distress.  HENT: [name]/AT, PERRLA, no scleral icterus Mouth/Throat: Oropharynx is clear and moist. No oropharyngeal exudate.  Cardiovascular: Normal rate, regular rhythm and normal heart sounds. Exam reveals no gallop and no friction rub.  No murmur heard.  Pulmonary/Chest: Effort normal and breath sounds  normal. No respiratory distress.  has no wheezes.  Neck = supple, no nuchal rigidity Abdominal: Soft. Bowel sounds are normal.  exhibits no distension. There is no tenderness.  Lymphadenopathy: no cervical adenopathy. No axillary adenopathy Neurological: alert and oriented to person, place, and time.  Skin: Skin is warm and dry. No rash noted. No erythema.  Psychiatric: a normal mood and affect.  behavior is normal.    Lab Results Recent Labs    07/05/20 1442 07/06/20 0632 07/07/20 0540  WBC 4.9 4.2 4.0  HGB 7.9* 8.3* 8.5*  HCT 26.8* 27.1* 28.5*  NA 139 138  --   K 4.2 3.8  --   CL 106 105  --   CO2 25 27  --   BUN 9 8  --   CREATININE 0.68 0.54  --    Liver Panel Recent Labs    07/05/20 1442 07/05/20 2032 07/06/20 0632  PROT 6.6  --  5.8*  ALBUMIN 3.5  --  3.3*  AST 13*  --  13*  ALT 10  --  10  ALKPHOS 71  --  60  BILITOT 2.3* 2.1* 2.5*  BILIDIR  --  0.6*  --   IBILI  --  1.5*  --    Sedimentation Rate Recent Labs    07/06/20 1400  ESRSEDRATE 14  C-Reactive Protein Recent Labs    07/06/20 1400  CRP 10.6*    Microbiology: reviewed Studies/Results: CT HIP RIGHT W CONTRAST  Result Date: 07/07/2020 CLINICAL DATA:  Thrombocytopenia, right subtrochanteric fracture. Draining wound for 4 weeks. EXAM: CT OF THE LOWER RIGHT EXTREMITY WITH CONTRAST TECHNIQUE: Multidetector CT imaging of the lower right extremity was performed according to the standard protocol following intravenous contrast administration. CONTRAST:  182m OMNIPAQUE IOHEXOL 300 MG/ML  SOLN COMPARISON:  05/10/2020 FINDINGS: Bones/Joint/Cartilage Ununited comminuted right intertrochanteric fracture transfixed with a intramedullary nail and interlocking femoral neck screws and mild callus formation across the bone fragments. Persistent low-attenuation material around the fracture fragments may reflect residual hemorrhagic material, but infection cannot be excluded. With a soft tissue track extending  towards the skin surface beyond the field of view. No drainable fluid collection. No acute hardware failure or complication. 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 Normal alignment. No joint effusion. Ligaments Ligaments are suboptimally evaluated by CT. Muscles and Tendons Muscles are normal.  No muscle atrophy. Soft tissue No fluid collection or hematoma.  No soft tissue mass. IMPRESSION: 1. Ununited comminuted right intertrochanteric fracture transfixed with a intramedullary nail and interlocking femoral neck screws and mild callus formation across the bone fragments. Persistent low-attenuation material around the fracture fragments may reflect residual hemorrhagic material, but infection cannot be excluded. With a soft tissue track extending towards the skin surface beyond the field of view. No drainable fluid collection. If there is persistent clinical concern regarding soft tissue infection extending to the fracture site, an MRI of the right hip utilizing mars protocol may be helpful for characterization. Electronically Signed   By: HKathreen Devoid  On: 07/07/2020 10:35   CT BIOPSY  Result Date: 07/07/2020 INDICATION: Thrombocytopenia, history of CML EXAM: CT GUIDED RIGHT ILIAC BONE MARROW ASPIRATION AND CORE BIOPSY Date:  07/07/2020 07/07/2020 10:21 am Radiologist:  M. TDaryll Brod MD Guidance:  CT FLUOROSCOPY TIME:  Fluoroscopy Time: NONE. MEDICATIONS: 1% lidocaine ANESTHESIA/SEDATION: 2.0 mg IV Versed; 100 mcg IV Fentanyl Moderate Sedation Time:  12 minutes The patient was continuously monitored during the procedure by the interventional radiology nurse under my direct supervision. CONTRAST:  1065mOMNIPAQUE IOHEXOL 300 MG/ML  SOLN COMPLICATIONS: None PROCEDURE: Informed consent was obtained from the patient following explanation of the procedure, risks, benefits and alternatives. The patient understands, agrees and consents for the procedure. All questions were addressed. A time out was  performed. The patient was positioned prone and non-contrast localization CT was performed of the pelvis to demonstrate the iliac marrow spaces. Maximal barrier sterile technique utilized including caps, mask, sterile gowns, sterile gloves, large sterile drape, hand hygiene, and Betadine prep. Under sterile conditions and local anesthesia, an 11 gauge coaxial bone biopsy needle was advanced into the right iliac marrow space. Needle position was confirmed with CT imaging. Initially, bone marrow aspiration was performed. Next, the 11 gauge outer cannula was utilized to obtain a right iliac bone marrow core biopsy. Needle was removed. Hemostasis was obtained with compression. The patient tolerated the procedure well. Samples were prepared with the cytotechnologist. No immediate complications. IMPRESSION: CT guided right iliac bone marrow aspiration and core biopsy. Electronically Signed   By: M.Jerilynn Mages Shick M.D.   On: 07/07/2020 11:46   CT BONE MARROW BIOPSY & ASPIRATION  Result Date: 07/07/2020 INDICATION: Thrombocytopenia, history of CML EXAM: CT GUIDED RIGHT ILIAC BONE MARROW ASPIRATION AND CORE BIOPSY Date:  07/07/2020 07/07/2020 10:21 am Radiologist:  M. TrDaryll BrodMD Guidance:  CT FLUOROSCOPY TIME:  Fluoroscopy Time: NONE. MEDICATIONS: 1% lidocaine ANESTHESIA/SEDATION: 2.0 mg IV Versed; 100 mcg IV Fentanyl Moderate Sedation Time:  12 minutes The patient was continuously monitored during the procedure by the interventional radiology nurse under my direct supervision. CONTRAST:  172m OMNIPAQUE IOHEXOL 300 MG/ML  SOLN COMPLICATIONS: None PROCEDURE: Informed consent was obtained from the patient following explanation of the procedure, risks, benefits and alternatives. The patient understands, agrees and consents for the procedure. All questions were addressed. A time out was performed. The patient was positioned prone and non-contrast localization CT was performed of the pelvis to demonstrate the iliac marrow  spaces. Maximal barrier sterile technique utilized including caps, mask, sterile gowns, sterile gloves, large sterile drape, hand hygiene, and Betadine prep. Under sterile conditions and local anesthesia, an 11 gauge coaxial bone biopsy needle was advanced into the right iliac marrow space. Needle position was confirmed with CT imaging. Initially, bone marrow aspiration was performed. Next, the 11 gauge outer cannula was utilized to obtain a right iliac bone marrow core biopsy. Needle was removed. Hemostasis was obtained with compression. The patient tolerated the procedure well. Samples were prepared with the cytotechnologist. No immediate complications. IMPRESSION: CT guided right iliac bone marrow aspiration and core biopsy. Electronically Signed   By: MJerilynn Mages  Shick M.D.   On: 07/07/2020 11:46     Assessment/Plan: Hx of right hip superficial wound/delayed wound healing = has finished out course of approx 4 wk of doxycycline. Wound is now healed. Repeat CT does not say convincingly that it is ongoing infection. Her inflammatory marker, sed rate, is now improved. Recommend to watch off of antibiotics. And follow up with orthopedics.   Thrombocytopenia = underwent BM aspirate. Work up guided by dr kIrene Limbo  Will sign off.  [facility] Cell: [phone]: [phone]  07/07/2020, 3:44 PM

## 2020-07-07 NOTE — Progress Notes (Signed)
PROGRESS NOTE    Kathy Irwin  HOO:[id number] DOB: 1950/06/13 DOA: 07/05/2020 PCP: Kerin Perna, NP    Brief Narrative:Kathy Irwin is a 70 y.o. femalewith medical history significant ofchronic myelomonocytic leukemia, chronic diastolic heart failure, chronic anemia, diabetes mellitus type 2, hyperlipidemia, hypersensitivity pneumonitis, interstitial lung disease, GERD, thrombocytopenia. Patient presented from oncology office for acute on chronic low platelets of unknown etiology   Assessment & Plan:   Principal Problem:   Thrombocytopenia (HCC) Active Problems:   Hypersensitivity pneumonitis (HCC)   Obesity, Class III, BMI 40-49.9 (morbid obesity) ([place])   Essential hypertension   Postoperative wound infection of right hip   Hyperbilirubinemia   Idiopathic thrombocytopenia (HCC)   #1 ITP -Baseline platelets around 50-60,000.  Followed by Dr. Velvet Bathe as an outpatient.  Admission platelets were 17,000 down from 44,000 - 3 days prior to admission. She received Nplate 07/06/2020 platelets 18 Oncology recommends platelet transfusion less than 10,000 or if she is bleeding. She is status post bone marrow biopsy 07/07/2020. Received 1 dose of Solu-Medrol.  #2 right hip infection is post intramedullary nail placement April 30, 2020.  She developed postop infection of the right hip with plans for irrigation and debridement.  This was canceled due to significant thrombocytopenia.  Appreciate ID input.  CT hip ordered.  #3 ANEMIA status post 1 unit of blood transfusion.  Hemoglobin 8.5 after 1 unit of blood transfusion.  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  #5 type 2 diabetes on Lantus CBG (last 3)  Recent Labs    07/06/20 2213 07/07/20 0758 07/07/20 1147  GLUCAP 159* 231* 186*    #6 chronic myelomonocytic leukemia followed by oncology  #7 chronic diastolic  heart failure/hypertension on Toprol and Lasix.  #8 hyperlipidemia on statin  #9 GERD on Protonix  #10 morbid obesity  Estimated body mass index is 41.2 kg/m as calculated from the following:   Height as of this encounter: _0  (1.626 m).   Weight as of this encounter: 108.9 kg.  DVT prophylaxis: SCD due to thrombocytopenia and anemia Code Status: Full code Family Communication: None at bedside Disposition Plan:  Status is: Observation  Dispo: The patient is from: Home              Anticipated d/c is to: SNF              Patient currently is not medically stable to d/c.   Difficult to place patient    Consultants:   Oncology and infectious disease  Procedures: Bone marrow biopsy 07/07/2020 Antimicrobials: None  Subjective: Patient is resting in bed she is not in any acute distress however she has multiple complaints complains of hip pain shortness of breath  Objective: Vitals:   07/07/20 0826 07/07/20 0910 07/07/20 0925 07/07/20 0930  BP:  137/76 117/70 122/74  Pulse:  84 93 91  Resp:  _1 Temp:      TempSrc:      SpO2: (!) 82% 98% 98% 98%  Weight:      Height:        Intake/Output Summary (Last 24 hours) at 07/07/2020 1201 Last data filed at 07/07/2020 1000 Gross per 24 hour  Intake 470 ml  Output 2800 ml  Net -2330 ml   Filed Weights   07/05/20 2000  Weight: 108.9 kg    Examination:  General exam: Appears older  than stated age Respiratory system: Clear to auscultation. Respiratory effort normal. Cardiovascular system: S1 & S2 heard, RRR. No JVD, murmurs, rubs, gallops or clicks. No pedal edema. Gastrointestinal system: Abdomen is nondistended, soft and nontender. No organomegaly or masses felt. Normal bowel sounds heard. Central nervous system: Alert and oriented. No focal neurological deficits. Extremities: 1+ edema Skin: No rashes, lesions or ulcers Psychiatry: Judgement and insight appear normal. Mood & affect appropriate.     Data  Reviewed: I have personally reviewed following labs and imaging studies  CBC: Recent Labs  Lab 07/02/20 0625 07/05/20 1442 07/06/20 0632 07/07/20 0540  WBC 5.7 4.9 4.2 4.0  NEUTROABS  --  1.3*  --  1.5*  HGB 8.1* 7.9* 8.3* 8.5*  HCT 27.7* 26.8* 27.1* 28.5*  MCV 103.4* 102.7* 101.5* 105.2*  PLT 44* 17* 16* 18*   Basic Metabolic Panel: Recent Labs  Lab 07/05/20 1442 07/06/20 0632  NA 139 138  K 4.2 3.8  CL 106 105  CO2 25 27  GLUCOSE 145* 113*  BUN 9 8  CREATININE 0.68 0.54  CALCIUM 9.6 9.2   GFR: Estimated Creatinine Clearance: 78.9 mL/min (by C-G formula based on SCr of 0.54 mg/dL). Liver Function Tests: Recent Labs  Lab 07/05/20 1442 07/05/20 2032 07/06/20 0632  AST 13*  --  13*  ALT 10  --  10  ALKPHOS 71  --  60  BILITOT 2.3* 2.1* 2.5*  PROT 6.6  --  5.8*  ALBUMIN 3.5  --  3.3*   No results for input(s): LIPASE, AMYLASE in the last 168 hours. No results for input(s): AMMONIA in the last 168 hours. Coagulation Profile: No results for input(s): INR, PROTIME in the last 168 hours. Cardiac Enzymes: No results for input(s): CKTOTAL, CKMB, CKMBINDEX, TROPONINI in the last 168 hours. BNP (last 3 results) No results for input(s): PROBNP in the last 8760 hours. HbA1C: No results for input(s): HGBA1C in the last 72 hours. CBG: Recent Labs  Lab 07/06/20 1209 07/06/20 1546 07/06/20 2213 07/07/20 0758 07/07/20 1147  GLUCAP 223* 154* 159* 231* 186*   Lipid Profile: No results for input(s): CHOL, HDL, LDLCALC, TRIG, CHOLHDL, LDLDIRECT in the last 72 hours. Thyroid Function Tests: No results for input(s): TSH, T4TOTAL, FREET4, T3FREE, THYROIDAB in the last 72 hours. Anemia Panel: Recent Labs    07/05/20 1443  FERRITIN 792*  TIBC 203*  IRON 78   Sepsis Labs: No results for input(s): PROCALCITON, LATICACIDVEN in the last 168 hours.  Recent Results (from the past 240 hour(s))  SARS Coronavirus 2 by RT PCR (hospital order, performed in [facility]  hospital lab) Nasopharyngeal Nasopharyngeal Swab     Status: None   Collection Time: 07/02/20  5:39 AM   Specimen: Nasopharyngeal Swab  Result Value Ref Range Status   SARS Coronavirus 2 NEGATIVE NEGATIVE Final    Comment: (NOTE) SARS-CoV-2 target nucleic acids are NOT DETECTED.  The SARS-CoV-2 RNA is generally detectable in upper and lower respiratory specimens during the acute phase of infection. The lowest concentration of SARS-CoV-2 viral copies this assay can detect is 250 copies / mL. A negative result does not preclude SARS-CoV-2 infection and should not be used as the sole basis for treatment or other patient management decisions.  A negative result may occur with improper specimen collection / handling, submission of specimen other than nasopharyngeal swab, presence of viral mutation(s) within the areas targeted by this assay, and inadequate number of viral copies (<250 copies / mL). A negative result  must be combined with clinical observations, patient history, and epidemiological information.  Fact Sheet for Patients:   StrictlyIdeas.no  Fact Sheet for Healthcare Providers: BankingDealers.co.za  This test is not yet approved or  cleared by the [place] FDA and has been authorized for detection and/or diagnosis of SARS-CoV-2 by FDA under an Emergency Use Authorization (EUA).  This EUA will remain in effect (meaning this test can be used) for the duration of the COVID-19 declaration under Section 564(b)(1) of the Act, 21 U.S.C. section 360bbb-3(b)(1), unless the authorization is terminated or revoked sooner.  Performed at [facility], [place] [address] [place], [place] [address]   Resp Panel by RT-PCR (Flu A&B, Covid) Nasopharyngeal Swab     Status: None   Collection Time: 07/05/20  5:06 PM   Specimen: Nasopharyngeal Swab; Nasopharyngeal(NP) swabs in vial transport medium  Result Value Ref Range Status   SARS  Coronavirus 2 by RT PCR NEGATIVE NEGATIVE Final    Comment: (NOTE) SARS-CoV-2 target nucleic acids are NOT DETECTED.  The SARS-CoV-2 RNA is generally detectable in upper respiratory specimens during the acute phase of infection. The lowest concentration of SARS-CoV-2 viral copies this assay can detect is 138 copies/mL. A negative result does not preclude SARS-Cov-2 infection and should not be used as the sole basis for treatment or other patient management decisions. A negative result may occur with  improper specimen collection/handling, submission of specimen other than nasopharyngeal swab, presence of viral mutation(s) within the areas targeted by this assay, and inadequate number of viral copies(<138 copies/mL). A negative result must be combined with clinical observations, patient history, and epidemiological information. The expected result is Negative.  Fact Sheet for Patients:  EntrepreneurPulse.com.au  Fact Sheet for Healthcare Providers:  IncredibleEmployment.be  This test is no t yet approved or cleared by the [place] FDA and  has been authorized for detection and/or diagnosis of SARS-CoV-2 by FDA under an Emergency Use Authorization (EUA). This EUA will remain  in effect (meaning this test can be used) for the duration of the COVID-19 declaration under Section 564(b)(1) of the Act, 21 U.S.C.section 360bbb-3(b)(1), unless the authorization is terminated  or revoked sooner.       Influenza A by PCR NEGATIVE NEGATIVE Final   Influenza B by PCR NEGATIVE NEGATIVE Final    Comment: (NOTE) The Xpert Xpress SARS-CoV-2/FLU/RSV plus assay is intended as an aid in the diagnosis of influenza from Nasopharyngeal swab specimens and should not be used as a sole basis for treatment. Nasal washings and aspirates are unacceptable for Xpert Xpress SARS-CoV-2/FLU/RSV testing.  Fact Sheet for  Patients: EntrepreneurPulse.com.au  Fact Sheet for Healthcare Providers: IncredibleEmployment.be  This test is not yet approved or cleared by the [place] FDA and has been authorized for detection and/or diagnosis of SARS-CoV-2 by FDA under an Emergency Use Authorization (EUA). This EUA will remain in effect (meaning this test can be used) for the duration of the COVID-19 declaration under Section 564(b)(1) of the Act, 21 U.S.C. section 360bbb-3(b)(1), unless the authorization is terminated or revoked.  Performed at [facility], [place] [address], [place], [place] [address]   Culture, blood (routine x 2)     Status: None (Preliminary result)   Collection Time: 07/06/20  1:47 PM   Specimen: BLOOD  Result Value Ref Range Status   Specimen Description   Final    BLOOD RIGHT ANTECUBITAL Performed at [facility] [address], [place], [place] [address]  Special Requests   Final    BOTTLES DRAWN AEROBIC ONLY Blood Culture adequate volume Performed at [facility] [address], [place], [place] [address]    Culture   Final    NO GROWTH < 12 HOURS Performed at [facility] [address] [place], [place] [address]    Report Status PENDING  Incomplete  Culture, blood (routine x 2)     Status: None (Preliminary result)   Collection Time: 07/06/20  1:47 PM   Specimen: BLOOD RIGHT HAND  Result Value Ref Range Status   Specimen Description   Final    BLOOD RIGHT HAND Performed at [facility] [address], [place],  [address]    Special Requests   Final    BOTTLES DRAWN AEROBIC ONLY Blood Culture adequate volume Performed at [facility] [address] [place],  [address]    Culture   Final    NO GROWTH < 12 HOURS Performed at [facility] [address] [place],  [address]    Report Status PENDING   Incomplete         Radiology Studies: CT HIP RIGHT W CONTRAST  Result Date: 07/07/2020 CLINICAL DATA:  Thrombocytopenia, right subtrochanteric fracture. Draining wound for 4 weeks. EXAM: CT OF THE LOWER RIGHT EXTREMITY WITH CONTRAST TECHNIQUE: Multidetector CT imaging of the lower right extremity was performed according to the standard protocol following intravenous contrast administration. CONTRAST:  123m OMNIPAQUE IOHEXOL 300 MG/ML  SOLN COMPARISON:  05/10/2020 FINDINGS: Bones/Joint/Cartilage Ununited comminuted right intertrochanteric fracture transfixed with a intramedullary nail and interlocking femoral neck screws and mild callus formation across the bone fragments. Persistent low-attenuation material around the fracture fragments may reflect residual hemorrhagic material, but infection cannot be excluded. With a soft tissue track extending towards the skin surface beyond the field of view. No drainable fluid collection. No acute hardware failure or complication. 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 Normal alignment. No joint effusion. Ligaments Ligaments are suboptimally evaluated by CT. Muscles and Tendons Muscles are normal.  No muscle atrophy. Soft tissue No fluid collection or hematoma.  No soft tissue mass. IMPRESSION: 1. Ununited comminuted right intertrochanteric fracture transfixed with a intramedullary nail and interlocking femoral neck screws and mild callus formation across the bone fragments. Persistent low-attenuation material around the fracture fragments may reflect residual hemorrhagic material, but infection cannot be excluded. With a soft tissue track extending towards the skin surface beyond the field of view. No drainable fluid collection. If there is persistent clinical concern regarding soft tissue infection extending to the fracture site, an MRI of the right hip utilizing mars protocol may be helpful for characterization. Electronically Signed   By: HKathreen Devoid   On: 07/07/2020 10:35   CT BIOPSY  Result Date: 07/07/2020 INDICATION: Thrombocytopenia, history of CML EXAM: CT GUIDED RIGHT ILIAC BONE MARROW ASPIRATION AND CORE BIOPSY Date:  07/07/2020 07/07/2020 10:21 am Radiologist:  M. TDaryll Brod MD Guidance:  CT FLUOROSCOPY TIME:  Fluoroscopy Time: NONE. MEDICATIONS: 1% lidocaine ANESTHESIA/SEDATION: 2.0 mg IV Versed; 100 mcg IV Fentanyl Moderate Sedation Time:  12 minutes The patient was continuously monitored during the procedure by the interventional radiology nurse under my direct supervision. CONTRAST:  1033mOMNIPAQUE IOHEXOL 300 MG/ML  SOLN COMPLICATIONS: None PROCEDURE: Informed consent was obtained from the patient following explanation of the procedure, risks, benefits and alternatives. The patient understands, agrees and consents for the procedure. All questions were addressed. A  time out was performed. The patient was positioned prone and non-contrast localization CT was performed of the pelvis to demonstrate the iliac marrow spaces. Maximal barrier sterile technique utilized including caps, mask, sterile gowns, sterile gloves, large sterile drape, hand hygiene, and Betadine prep. Under sterile conditions and local anesthesia, an 11 gauge coaxial bone biopsy needle was advanced into the right iliac marrow space. Needle position was confirmed with CT imaging. Initially, bone marrow aspiration was performed. Next, the 11 gauge outer cannula was utilized to obtain a right iliac bone marrow core biopsy. Needle was removed. Hemostasis was obtained with compression. The patient tolerated the procedure well. Samples were prepared with the cytotechnologist. No immediate complications. IMPRESSION: CT guided right iliac bone marrow aspiration and core biopsy. Electronically Signed   By: Jerilynn Mages.  Shick M.D.   On: 07/07/2020 11:46   CT BONE MARROW BIOPSY & ASPIRATION  Result Date: 07/07/2020 INDICATION: Thrombocytopenia, history of CML EXAM: CT GUIDED RIGHT ILIAC BONE  MARROW ASPIRATION AND CORE BIOPSY Date:  07/07/2020 07/07/2020 10:21 am Radiologist:  M. Daryll Brod, MD Guidance:  CT FLUOROSCOPY TIME:  Fluoroscopy Time: NONE. MEDICATIONS: 1% lidocaine ANESTHESIA/SEDATION: 2.0 mg IV Versed; 100 mcg IV Fentanyl Moderate Sedation Time:  12 minutes The patient was continuously monitored during the procedure by the interventional radiology nurse under my direct supervision. CONTRAST:  129m OMNIPAQUE IOHEXOL 300 MG/ML  SOLN COMPLICATIONS: None PROCEDURE: Informed consent was obtained from the patient following explanation of the procedure, risks, benefits and alternatives. The patient understands, agrees and consents for the procedure. All questions were addressed. A time out was performed. The patient was positioned prone and non-contrast localization CT was performed of the pelvis to demonstrate the iliac marrow spaces. Maximal barrier sterile technique utilized including caps, mask, sterile gowns, sterile gloves, large sterile drape, hand hygiene, and Betadine prep. Under sterile conditions and local anesthesia, an 11 gauge coaxial bone biopsy needle was advanced into the right iliac marrow space. Needle position was confirmed with CT imaging. Initially, bone marrow aspiration was performed. Next, the 11 gauge outer cannula was utilized to obtain a right iliac bone marrow core biopsy. Needle was removed. Hemostasis was obtained with compression. The patient tolerated the procedure well. Samples were prepared with the cytotechnologist. No immediate complications. IMPRESSION: CT guided right iliac bone marrow aspiration and core biopsy. Electronically Signed   By: MJerilynn Mages  Shick M.D.   On: 07/07/2020 11:46        Scheduled Meds: . sodium chloride   Intravenous Once  . cholecalciferol  5,000 Units Oral Daily  . famotidine  40 mg Oral Daily  . fentaNYL      . furosemide  20 mg Oral Daily  . insulin aspart  0-15 Units Subcutaneous TID WC  . insulin glargine  10 Units  Subcutaneous Daily  . metoprolol succinate  75 mg Oral Daily  . midazolam      . mometasone-formoterol  2 puff Inhalation BID  . montelukast  10 mg Oral QHS  . olopatadine  1 drop Both Eyes BID  . pantoprazole  40 mg Oral Daily  . pravastatin  40 mg Oral QHS  . pregabalin  100 mg Oral QHS  . romiPLOStim  2 mcg/kg Subcutaneous Weekly  . sertraline  100 mg Oral QHS  . traZODone  100 mg Oral QHS   Continuous Infusions:   LOS: 0 days   EGeorgette Shell MD  07/07/2020, 12:01 PM

## 2020-07-07 NOTE — Progress Notes (Signed)
HEMATOLOGY-ONCOLOGY PROGRESS NOTE  DOS 07/07/2020  SUBJECTIVE:  Kathy Irwin was seen for hematology follow-up today.  She had her bone marrow biopsy this morning which was uneventful.  Tolerated IVIG overnight without any acute issues.  Platelet counts stable at 18k no overt bleeding noted. No fevers noted.  Still having right hip pain. Patient being followed by infectious disease Dr. Graylon Good for consideration of additional antibiotics for possible right hip infection. Notes no other acute new symptoms.  Good p.o. intake.  REVIEW OF SYSTEMS:   Constitutional: Reports feeling feverish and cold Eyes: Denies blurriness of vision Ears, nose, mouth, throat, and face: Denies mucositis or sore throat Respiratory: Denies cough, dyspnea or wheezes Cardiovascular: Denies palpitation, chest discomfort Gastrointestinal:  Denies nausea, heartburn or change in bowel habits Skin: Denies abnormal skin rashes Lymphatics: Denies new lymphadenopathy or easy bruising Neurological:Denies numbness, tingling or new weaknesses Behavioral/Psych: Mood is stable, no new changes  Extremities: No lower extremity edema All other systems were reviewed with the patient and are negative.  I have reviewed the past medical history, past surgical history, social history and family history with the patient and they are unchanged from previous note.   PHYSICAL EXAMINATION: ECOG PERFORMANCE STATUS: 2 - Symptomatic, <50% confined to bed  Vitals:   07/07/20 1346 07/07/20 1413  BP: (!) 112/47 121/60  Pulse: 85 87  Resp:  16  Temp: 97.6 F (36.4 C)   SpO2: 100% 100%   Filed Weights   07/05/20 2000  Weight: 240 lb (108.9 kg)    Intake/Output from previous day: 03/22 0701 - 03/23 0700 In: 710 [P.O.:710] Out: 2400 [Urine:2400]  GENERAL:alert, no distress and comfortable SKIN: skin color, texture, turgor are normal, no rashes or significant lesions EYES: normal, Conjunctiva are pink and non-injected, sclera  clear OROPHARYNX:no exudate, no erythema and lips, buccal mucosa, and tongue normal  NECK: supple, thyroid normal size, non-tender, without nodularity LYMPH:  no palpable lymphadenopathy in the cervical, axillary or inguinal LUNGS: clear to auscultation and percussion with normal breathing effort HEART: regular rate & rhythm and no murmurs and no lower extremity edema ABDOMEN:abdomen soft, non-tender and normal bowel sounds Musculoskeletal:no cyanosis of digits and no clubbing  NEURO: alert & oriented x 3 with fluent speech, no focal motor/sensory deficits  LABORATORY DATA:  I have reviewed the data as listed CMP Latest Ref Rng & Units 07/06/2020 07/05/2020 07/05/2020  Glucose 70 - 99 mg/dL 113(H) 145(H) -  BUN 8 - 23 mg/dL 8 9 -  Creatinine 0.44 - 1.00 mg/dL 0.54 0.68 -  Sodium 135 - 145 mmol/L 138 139 -  Potassium 3.5 - 5.1 mmol/L 3.8 4.2 -  Chloride 98 - 111 mmol/L 105 106 -  CO2 22 - 32 mmol/L 27 25 -  Calcium 8.9 - 10.3 mg/dL 9.2 9.6 -  Total Protein 6.5 - 8.1 g/dL 5.8(L) 6.6 -  Total Bilirubin 0.3 - 1.2 mg/dL 2.5(H) 2.1(H) 2.3(H)  Alkaline Phos 38 - 126 U/L 60 71 -  AST 15 - 41 U/L 13(L) 13(L) -  ALT 0 - 44 U/L 10 10 -    Lab Results  Component Value Date   WBC 4.0 07/07/2020   HGB 8.5 (L) 07/07/2020   HCT 28.5 (L) 07/07/2020   MCV 105.2 (H) 07/07/2020   PLT 18 (LL) 07/07/2020   NEUTROABS 1.5 (L) 07/07/2020    CT HIP RIGHT WO CONTRAST  Result Date: 06/23/2020 CLINICAL DATA:  Recent right hip ORIF for intertrochanteric fracture presenting with low platelets and  anemia. EXAM: CT OF THE RIGHT HIP WITHOUT CONTRAST TECHNIQUE: Multidetector CT imaging of the right hip was performed according to the standard protocol. Multiplanar CT image reconstructions were also generated. COMPARISON:  Radiographs 04/30/2020 FINDINGS: Bones/Joint/Cartilage Redemonstration of the a highly comminuted inter trochanteric right femur fracture post intramedullary nail placement with transcervical  pinning. There is some developing callus formation about the numerous fracture fragments. Some low-intermediate attenuation surrounding the fracture fragments likely reflects a small amount of intramedullary hematoma with additional seroma in stranding extending to the skin surface of the lateral soft tissues of hip. No acute hardware complication or failure is seen otherwise. There is a background of severe degenerative changes in the left hip including larger geode formation along the articular surface of the femoral head as well as extensive periarticular spurring about the acetabulum. No acute fracture of the included bones of the pelvis is seen. Ligaments Suboptimally assessed by CT. Muscles and Tendons Intramuscular stranding and thickening a seen along the surgical approach the patient's intramedullary nail placement. Small of intramuscular thickening and possible hematoma/seroma subjacent to the iliotibial band. No frankly or retracted tendons nor other acute musculotendinous injury. Soft tissues Soft tissue swelling and edematous changes of the right lower extremity most pronounced laterally with scar stranding and scarring related to the surgical approach. No soft tissue gas or unexpected foreign body is seen. Included portions of the pelvis are free of acute abnormality. IMPRESSION: 1. Redemonstration of the highly comminuted inter trochanteric right femur fracture post intramedullary nail placement with transcervical pinning. Developing callus formation about the numerous fracture fragments. Some minimal low-intermediate attenuation surrounding the fracture fragments likely reflects only a small amount residual hematoma or seroma with additional scarring and thickening extending to the skin surface of the lateral soft tissues of the hip. 2. No other acute osseous injury or traumatic malalignment. No other large collection or hematoma is seen. 3. Background of severe degenerative changes in the left hip.  Electronically Signed   By: Lovena Le M.D.   On: 06/23/2020 22:21   CT HIP RIGHT W CONTRAST  Result Date: 07/07/2020 CLINICAL DATA:  Thrombocytopenia, right subtrochanteric fracture. Draining wound for 4 weeks. EXAM: CT OF THE LOWER RIGHT EXTREMITY WITH CONTRAST TECHNIQUE: Multidetector CT imaging of the lower right extremity was performed according to the standard protocol following intravenous contrast administration. CONTRAST:  176m OMNIPAQUE IOHEXOL 300 MG/ML  SOLN COMPARISON:  05/10/2020 FINDINGS: Bones/Joint/Cartilage Ununited comminuted right intertrochanteric fracture transfixed with a intramedullary nail and interlocking femoral neck screws and mild callus formation across the bone fragments. Persistent low-attenuation material around the fracture fragments may reflect residual hemorrhagic material, but infection cannot be excluded. With a soft tissue track extending towards the skin surface beyond the field of view. No drainable fluid collection. No acute hardware failure or complication. 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 Normal alignment. No joint effusion. Ligaments Ligaments are suboptimally evaluated by CT. Muscles and Tendons Muscles are normal.  No muscle atrophy. Soft tissue No fluid collection or hematoma.  No soft tissue mass. IMPRESSION: 1. Ununited comminuted right intertrochanteric fracture transfixed with a intramedullary nail and interlocking femoral neck screws and mild callus formation across the bone fragments. Persistent low-attenuation material around the fracture fragments may reflect residual hemorrhagic material, but infection cannot be excluded. With a soft tissue track extending towards the skin surface beyond the field of view. No drainable fluid collection. If there is persistent clinical concern regarding soft tissue infection extending to the fracture site, an MRI of  the right hip utilizing mars protocol may be helpful for characterization.  Electronically Signed   By: Kathreen Devoid   On: 07/07/2020 10:35   CT BIOPSY  Result Date: 07/07/2020 INDICATION: Thrombocytopenia, history of CML EXAM: CT GUIDED RIGHT ILIAC BONE MARROW ASPIRATION AND CORE BIOPSY Date:  07/07/2020 07/07/2020 10:21 am Radiologist:  M. Daryll Brod, MD Guidance:  CT FLUOROSCOPY TIME:  Fluoroscopy Time: NONE. MEDICATIONS: 1% lidocaine ANESTHESIA/SEDATION: 2.0 mg IV Versed; 100 mcg IV Fentanyl Moderate Sedation Time:  12 minutes The patient was continuously monitored during the procedure by the interventional radiology nurse under my direct supervision. CONTRAST:  141m OMNIPAQUE IOHEXOL 300 MG/ML  SOLN COMPLICATIONS: None PROCEDURE: Informed consent was obtained from the patient following explanation of the procedure, risks, benefits and alternatives. The patient understands, agrees and consents for the procedure. All questions were addressed. A time out was performed. The patient was positioned prone and non-contrast localization CT was performed of the pelvis to demonstrate the iliac marrow spaces. Maximal barrier sterile technique utilized including caps, mask, sterile gowns, sterile gloves, large sterile drape, hand hygiene, and Betadine prep. Under sterile conditions and local anesthesia, an 11 gauge coaxial bone biopsy needle was advanced into the right iliac marrow space. Needle position was confirmed with CT imaging. Initially, bone marrow aspiration was performed. Next, the 11 gauge outer cannula was utilized to obtain a right iliac bone marrow core biopsy. Needle was removed. Hemostasis was obtained with compression. The patient tolerated the procedure well. Samples were prepared with the cytotechnologist. No immediate complications. IMPRESSION: CT guided right iliac bone marrow aspiration and core biopsy. Electronically Signed   By: MJerilynn Mages  Shick M.D.   On: 07/07/2020 11:46   CT BONE MARROW BIOPSY & ASPIRATION  Result Date: 07/07/2020 INDICATION: Thrombocytopenia, history  of CML EXAM: CT GUIDED RIGHT ILIAC BONE MARROW ASPIRATION AND CORE BIOPSY Date:  07/07/2020 07/07/2020 10:21 am Radiologist:  M. TDaryll Brod MD Guidance:  CT FLUOROSCOPY TIME:  Fluoroscopy Time: NONE. MEDICATIONS: 1% lidocaine ANESTHESIA/SEDATION: 2.0 mg IV Versed; 100 mcg IV Fentanyl Moderate Sedation Time:  12 minutes The patient was continuously monitored during the procedure by the interventional radiology nurse under my direct supervision. CONTRAST:  1032mOMNIPAQUE IOHEXOL 300 MG/ML  SOLN COMPLICATIONS: None PROCEDURE: Informed consent was obtained from the patient following explanation of the procedure, risks, benefits and alternatives. The patient understands, agrees and consents for the procedure. All questions were addressed. A time out was performed. The patient was positioned prone and non-contrast localization CT was performed of the pelvis to demonstrate the iliac marrow spaces. Maximal barrier sterile technique utilized including caps, mask, sterile gowns, sterile gloves, large sterile drape, hand hygiene, and Betadine prep. Under sterile conditions and local anesthesia, an 11 gauge coaxial bone biopsy needle was advanced into the right iliac marrow space. Needle position was confirmed with CT imaging. Initially, bone marrow aspiration was performed. Next, the 11 gauge outer cannula was utilized to obtain a right iliac bone marrow core biopsy. Needle was removed. Hemostasis was obtained with compression. The patient tolerated the procedure well. Samples were prepared with the cytotechnologist. No immediate complications. IMPRESSION: CT guided right iliac bone marrow aspiration and core biopsy. Electronically Signed   By: M.Jerilynn Mages Shick M.D.   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 ITP is likely since platelets improved significantly with  recent steroid use. Additional factors worsening her platelet count are infection in her right hip surgical site with drainage and absorption of platelets, multiple lines of antibiotics etc. 3) Macrocytic anemia -possibly from blood loss with surgery and ongoing drainage.  Could also be from CMML.  Labs in stable at this time.  Hemoglobin a little better after 1 unit of PRBC transfusion. PLAN:  -Platelets are slightly up at 18k .  Hemoglobin is stable 8.5.  No overt bleeding noted. -Patient tolerated IVIG and Nplate without any acute issues.  There is no significant immune thrombocytopenia competent hopefully her platelets will start improving in the next day or 2. -We will plan to increase Nplate dose if needed and repeat IVIG if necessary. -Patient had bone marrow biopsy done this morning and tolerated this well.  We will follow-up on the bone marrow biopsy results. [facility] medicine and infectious disease are following to determine management of the patient's nonhealing right hip fracture issue with chronic drainage -Hematology will continue to follow  Alma MS 07/07/20

## 2020-07-07 NOTE — Procedures (Signed)
Interventional Radiology Procedure Note  Procedure: CT BM ASP AND CORE BX    Complications: None  Estimated Blood Loss:  MIN  Findings: 11 G CORES DRY TAP     M. Daryll Brod, MD

## 2020-07-07 NOTE — Progress Notes (Signed)
Date and time results received: 07/07/20 0622   Test: Plt  Critical Value: 18 Name of Provider Notified: Hal Hope MD  MD aware

## 2020-07-08 ENCOUNTER — Ambulatory Visit (INDEPENDENT_AMBULATORY_CARE_PROVIDER_SITE_OTHER): Payer: 59 | Admitting: Primary Care

## 2020-07-08 DIAGNOSIS — C9310 Chronic myelomonocytic leukemia not having achieved remission: Secondary | ICD-10-CM | POA: Diagnosis not present

## 2020-07-08 DIAGNOSIS — D696 Thrombocytopenia, unspecified: Secondary | ICD-10-CM | POA: Diagnosis not present

## 2020-07-08 DIAGNOSIS — D693 Immune thrombocytopenic purpura: Secondary | ICD-10-CM | POA: Diagnosis not present

## 2020-07-08 LAB — GLUCOSE, CAPILLARY
Glucose-Capillary: 102 mg/dL — ABNORMAL HIGH (ref 70–99)
Glucose-Capillary: 140 mg/dL — ABNORMAL HIGH (ref 70–99)
Glucose-Capillary: 76 mg/dL (ref 70–99)
Glucose-Capillary: 99 mg/dL (ref 70–99)

## 2020-07-08 LAB — CBC WITH DIFFERENTIAL/PLATELET
Abs Immature Granulocytes: 0.21 10*3/uL — ABNORMAL HIGH (ref 0.00–0.07)
Basophils Absolute: 0 10*3/uL (ref 0.0–0.1)
Basophils Relative: 0 %
Eosinophils Absolute: 0 10*3/uL (ref 0.0–0.5)
Eosinophils Relative: 0 %
HCT: 27.4 % — ABNORMAL LOW (ref 36.0–46.0)
Hemoglobin: 8.1 g/dL — ABNORMAL LOW (ref 12.0–15.0)
Immature Granulocytes: 6 %
Lymphocytes Relative: 40 %
Lymphs Abs: 1.4 10*3/uL (ref 0.7–4.0)
MCH: 30.5 pg (ref 26.0–34.0)
MCHC: 29.6 g/dL — ABNORMAL LOW (ref 30.0–36.0)
MCV: 103 fL — ABNORMAL HIGH (ref 80.0–100.0)
Monocytes Absolute: 0.8 10*3/uL (ref 0.1–1.0)
Monocytes Relative: 24 %
Neutro Abs: 1 10*3/uL — ABNORMAL LOW (ref 1.7–7.7)
Neutrophils Relative %: 30 %
Platelets: 20 10*3/uL — CL (ref 150–400)
RBC: 2.66 MIL/uL — ABNORMAL LOW (ref 3.87–5.11)
RDW: 23.4 % — ABNORMAL HIGH (ref 11.5–15.5)
WBC: 3.4 10*3/uL — ABNORMAL LOW (ref 4.0–10.5)
nRBC: 9.6 % — ABNORMAL HIGH (ref 0.0–0.2)

## 2020-07-08 LAB — IMMATURE PLATELET FRACTION: Immature Platelet Fraction: 38.7 % — ABNORMAL HIGH (ref 1.2–8.6)

## 2020-07-08 MED ORDER — ALPRAZOLAM 0.5 MG PO TABS: 0.5000 mg | ORAL_TABLET | Freq: Three times a day (TID) | ORAL | Status: DC | PRN

## 2020-07-08 MED ORDER — HYDROXYZINE HCL 10 MG PO TABS
10.0000 mg | ORAL_TABLET | Freq: Three times a day (TID) | ORAL | Status: DC | PRN
  Administered 2020-07-08 – 2020-07-12 (×3): 10 mg via ORAL
  Filled 2020-07-08 (×5): qty 1

## 2020-07-08 MED ORDER — ROMIPLOSTIM INJECTION 500 MCG
3.0000 ug/kg | Freq: Once | SUBCUTANEOUS | Status: AC
  Administered 2020-07-08: 325 ug via SUBCUTANEOUS
  Filled 2020-07-08: qty 0.65

## 2020-07-08 NOTE — Progress Notes (Addendum)
HEMATOLOGY-ONCOLOGY PROGRESS NOTE  SUBJECTIVE:  Kathy Irwin was seen for hematology follow-up today.  She was noted to have a small amount of blood on her pad in the bed.  He has been looked over by myself and nursing and we cannot find the source of the bleeding.  The amount on the pad was approximately 2-1/2 to 3 inches in diameter.  CBC from today not yet drawn. No fevers noted.  Still having right hip pain. Patient being followed by infectious disease Dr. Graylon Good for consideration of additional antibiotics for possible right hip infection.  Reports pain to her right hip with standing.  She remains afebrile and blood cultures are negative to date. Notes no other acute new symptoms.  Good p.o. intake.  REVIEW OF SYSTEMS:   Constitutional: Denies fevers and chills Eyes: Denies blurriness of vision Ears, nose, mouth, throat, and face: Denies mucositis or sore throat Respiratory: Denies cough, dyspnea or wheezes Cardiovascular: Denies palpitation, chest discomfort Gastrointestinal:  Denies nausea, heartburn or change in bowel habits Skin: Denies abnormal skin rashes Lymphatics: Denies new lymphadenopathy or easy bruising Neurological:Denies numbness, tingling or new weaknesses Behavioral/Psych: Mood is stable, no new changes  Extremities: No lower extremity edema All other systems were reviewed with the patient and are negative.  I have reviewed the past medical history, past surgical history, social history and family history with the patient and they are unchanged from previous note.   PHYSICAL EXAMINATION: ECOG PERFORMANCE STATUS: 2 - Symptomatic, <50% confined to bed  Vitals:   07/08/20 0543 07/08/20 0818  BP: (!) 112/57   Pulse: 83   Resp: 16   Temp: 98 F (36.7 C)   SpO2: 92% 99%   Filed Weights   07/05/20 2000  Weight: 108.9 kg    Intake/Output from previous day: 03/23 0701 - 03/24 0700 In: 360 [P.O.:360] Out: 1200 [Urine:1200]  GENERAL:alert, no distress and  comfortable SKIN: skin color, texture, turgor are normal, no rashes or significant lesions EYES: normal, Conjunctiva are pink and non-injected, sclera clear OROPHARYNX:no exudate, no erythema and lips, buccal mucosa, and tongue normal  NECK: supple, thyroid normal size, non-tender, without nodularity LYMPH:  no palpable lymphadenopathy in the cervical, axillary or inguinal LUNGS: clear to auscultation and percussion with normal breathing effort HEART: regular rate & rhythm and no murmurs and no lower extremity edema ABDOMEN:abdomen soft, non-tender and normal bowel sounds Musculoskeletal:no cyanosis of digits and no clubbing  NEURO: alert & oriented x 3 with fluent speech, no focal motor/sensory deficits  LABORATORY DATA:  I have reviewed the data as listed CMP Latest Ref Rng & Units 07/06/2020 07/05/2020 07/05/2020  Glucose 70 - 99 mg/dL 113(H) 145(H) -  BUN 8 - 23 mg/dL 8 9 -  Creatinine 0.44 - 1.00 mg/dL 0.54 0.68 -  Sodium 135 - 145 mmol/L 138 139 -  Potassium 3.5 - 5.1 mmol/L 3.8 4.2 -  Chloride 98 - 111 mmol/L 105 106 -  CO2 22 - 32 mmol/L 27 25 -  Calcium 8.9 - 10.3 mg/dL 9.2 9.6 -  Total Protein 6.5 - 8.1 g/dL 5.8(L) 6.6 -  Total Bilirubin 0.3 - 1.2 mg/dL 2.5(H) 2.1(H) 2.3(H)  Alkaline Phos 38 - 126 U/L 60 71 -  AST 15 - 41 U/L 13(L) 13(L) -  ALT 0 - 44 U/L 10 10 -    Lab Results  Component Value Date   WBC 3.4 (L) 07/08/2020   HGB 8.1 (L) 07/08/2020   HCT 27.4 (L) 07/08/2020   MCV  103.0 (H) 07/08/2020   PLT 20 (LL) 07/08/2020   NEUTROABS 1.0 (L) 07/08/2020    CT HIP RIGHT WO CONTRAST  Result Date: 06/23/2020 CLINICAL DATA:  Recent right hip ORIF for intertrochanteric fracture presenting with low platelets and anemia. EXAM: CT OF THE RIGHT HIP WITHOUT CONTRAST TECHNIQUE: Multidetector CT imaging of the right hip was performed according to the standard protocol. Multiplanar CT image reconstructions were also generated. COMPARISON:  Radiographs 04/30/2020 FINDINGS:  Bones/Joint/Cartilage Redemonstration of the a highly comminuted inter trochanteric right femur fracture post intramedullary nail placement with transcervical pinning. There is some developing callus formation about the numerous fracture fragments. Some low-intermediate attenuation surrounding the fracture fragments likely reflects a small amount of intramedullary hematoma with additional seroma in stranding extending to the skin surface of the lateral soft tissues of hip. No acute hardware complication or failure is seen otherwise. There is a background of severe degenerative changes in the left hip including larger geode formation along the articular surface of the femoral head as well as extensive periarticular spurring about the acetabulum. No acute fracture of the included bones of the pelvis is seen. Ligaments Suboptimally assessed by CT. Muscles and Tendons Intramuscular stranding and thickening a seen along the surgical approach the patient's intramedullary nail placement. Small of intramuscular thickening and possible hematoma/seroma subjacent to the iliotibial band. No frankly or retracted tendons nor other acute musculotendinous injury. Soft tissues Soft tissue swelling and edematous changes of the right lower extremity most pronounced laterally with scar stranding and scarring related to the surgical approach. No soft tissue gas or unexpected foreign body is seen. Included portions of the pelvis are free of acute abnormality. IMPRESSION: 1. Redemonstration of the highly comminuted inter trochanteric right femur fracture post intramedullary nail placement with transcervical pinning. Developing callus formation about the numerous fracture fragments. Some minimal low-intermediate attenuation surrounding the fracture fragments likely reflects only a small amount residual hematoma or seroma with additional scarring and thickening extending to the skin surface of the lateral soft tissues of the hip. 2. No  other acute osseous injury or traumatic malalignment. No other large collection or hematoma is seen. 3. Background of severe degenerative changes in the left hip. Electronically Signed   By: Lovena Le M.D.   On: 06/23/2020 22:21   CT HIP RIGHT W CONTRAST  Result Date: 07/07/2020 CLINICAL DATA:  Thrombocytopenia, right subtrochanteric fracture. Draining wound for 4 weeks. EXAM: CT OF THE LOWER RIGHT EXTREMITY WITH CONTRAST TECHNIQUE: Multidetector CT imaging of the lower right extremity was performed according to the standard protocol following intravenous contrast administration. CONTRAST:  163m OMNIPAQUE IOHEXOL 300 MG/ML  SOLN COMPARISON:  05/10/2020 FINDINGS: Bones/Joint/Cartilage Ununited comminuted right intertrochanteric fracture transfixed with a intramedullary nail and interlocking femoral neck screws and mild callus formation across the bone fragments. Persistent low-attenuation material around the fracture fragments may reflect residual hemorrhagic material, but infection cannot be excluded. With a soft tissue track extending towards the skin surface beyond the field of view. No drainable fluid collection. No acute hardware failure or complication. 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 Normal alignment. No joint effusion. Ligaments Ligaments are suboptimally evaluated by CT. Muscles and Tendons Muscles are normal.  No muscle atrophy. Soft tissue No fluid collection or hematoma.  No soft tissue mass. IMPRESSION: 1. Ununited comminuted right intertrochanteric fracture transfixed with a intramedullary nail and interlocking femoral neck screws and mild callus formation across the bone fragments. Persistent low-attenuation material around the fracture fragments may reflect residual hemorrhagic material,  but infection cannot be excluded. With a soft tissue track extending towards the skin surface beyond the field of view. No drainable fluid collection. If there is persistent clinical concern  regarding soft tissue infection extending to the fracture site, an MRI of the right hip utilizing mars protocol may be helpful for characterization. Electronically Signed   By: Kathreen Devoid   On: 07/07/2020 10:35   CT BIOPSY  Result Date: 07/07/2020 INDICATION: Thrombocytopenia, history of CML EXAM: CT GUIDED RIGHT ILIAC BONE MARROW ASPIRATION AND CORE BIOPSY Date:  07/07/2020 07/07/2020 10:21 am Radiologist:  M. Daryll Brod, MD Guidance:  CT FLUOROSCOPY TIME:  Fluoroscopy Time: NONE. MEDICATIONS: 1% lidocaine ANESTHESIA/SEDATION: 2.0 mg IV Versed; 100 mcg IV Fentanyl Moderate Sedation Time:  12 minutes The patient was continuously monitored during the procedure by the interventional radiology nurse under my direct supervision. CONTRAST:  128m OMNIPAQUE IOHEXOL 300 MG/ML  SOLN COMPLICATIONS: None PROCEDURE: Informed consent was obtained from the patient following explanation of the procedure, risks, benefits and alternatives. The patient understands, agrees and consents for the procedure. All questions were addressed. A time out was performed. The patient was positioned prone and non-contrast localization CT was performed of the pelvis to demonstrate the iliac marrow spaces. Maximal barrier sterile technique utilized including caps, mask, sterile gowns, sterile gloves, large sterile drape, hand hygiene, and Betadine prep. Under sterile conditions and local anesthesia, an 11 gauge coaxial bone biopsy needle was advanced into the right iliac marrow space. Needle position was confirmed with CT imaging. Initially, bone marrow aspiration was performed. Next, the 11 gauge outer cannula was utilized to obtain a right iliac bone marrow core biopsy. Needle was removed. Hemostasis was obtained with compression. The patient tolerated the procedure well. Samples were prepared with the cytotechnologist. No immediate complications. IMPRESSION: CT guided right iliac bone marrow aspiration and core biopsy. Electronically Signed    By: MJerilynn Mages  Shick M.D.   On: 07/07/2020 11:46   CT BONE MARROW BIOPSY & ASPIRATION  Result Date: 07/07/2020 INDICATION: Thrombocytopenia, history of CML EXAM: CT GUIDED RIGHT ILIAC BONE MARROW ASPIRATION AND CORE BIOPSY Date:  07/07/2020 07/07/2020 10:21 am Radiologist:  M. TDaryll Brod MD Guidance:  CT FLUOROSCOPY TIME:  Fluoroscopy Time: NONE. MEDICATIONS: 1% lidocaine ANESTHESIA/SEDATION: 2.0 mg IV Versed; 100 mcg IV Fentanyl Moderate Sedation Time:  12 minutes The patient was continuously monitored during the procedure by the interventional radiology nurse under my direct supervision. CONTRAST:  1025mOMNIPAQUE IOHEXOL 300 MG/ML  SOLN COMPLICATIONS: None PROCEDURE: Informed consent was obtained from the patient following explanation of the procedure, risks, benefits and alternatives. The patient understands, agrees and consents for the procedure. All questions were addressed. A time out was performed. The patient was positioned prone and non-contrast localization CT was performed of the pelvis to demonstrate the iliac marrow spaces. Maximal barrier sterile technique utilized including caps, mask, sterile gowns, sterile gloves, large sterile drape, hand hygiene, and Betadine prep. Under sterile conditions and local anesthesia, an 11 gauge coaxial bone biopsy needle was advanced into the right iliac marrow space. Needle position was confirmed with CT imaging. Initially, bone marrow aspiration was performed. Next, the 11 gauge outer cannula was utilized to obtain a right iliac bone marrow core biopsy. Needle was removed. Hemostasis was obtained with compression. The patient tolerated the procedure well. Samples were prepared with the cytotechnologist. No immediate complications. IMPRESSION: CT guided right iliac bone marrow aspiration and core biopsy. Electronically Signed   By: M.Jerilynn Mages Shick M.D.   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 ITP is likely since platelets improved significantly with recent steroid use. Additional factors worsening her platelet count are infection in her right hip surgical site with drainage and absorption of platelets, multiple lines of antibiotics etc. 3) Macrocytic anemia -possibly from blood loss with surgery and ongoing drainage.  Could also be from CMML.  Labs in stable at this time.  Hemoglobin a little better after 1 unit of PRBC transfusion. PLAN:  -CBC from today is pending.  She had a small amount of bleeding noted on her pad-unclear of source.  Does not appear to be actively bleeding at this time.  Recommend PRBC transfusion for hemoglobin less than 8 and platelet transfusion for platelet count less than 10,000 or active bleeding.  I advised the patient to let nursing know if she develops any recurrent bleeding. -Patient tolerated IVIG and Nplate without any acute issues.  There is no significant immune thrombocytopenia competent hopefully her platelets will start improving in the next day or 2. -We will plan to increase Nplate dose if needed and repeat IVIG if necessary. -Patient had bone marrow biopsy performed 3/23/thousand 22 and tolerated the procedure well.  We will follow-up on the bone marrow biopsy results. [facility] medicine and infectious disease are following to determine management of the patient's nonhealing right hip fracture issue with chronic drainage -Hematology will continue to follow  Mikey Bussing 07/08/20   ADDENDUM  .Patient was Personally and independently interviewed, examined and relevant elements of the history of present illness were reviewed in details and an assessment and plan was created. All elements of the patient's history of present illness , assessment and plan were discussed in details with Mikey Bussing DNP. The above documentation reflects our combined findings  assessment and plan.   . CBC Latest Ref Rng & Units 07/08/2020 07/07/2020 07/06/2020  WBC 4.0 - 10.5 K/uL 3.4(L) 4.0 4.2  Hemoglobin 12.0 - 15.0 g/dL 8.1(L) 8.5(L) 8.3(L)  Hematocrit 36.0 - 46.0 % 27.4(L) 28.5(L) 27.1(L)  Platelets 150 - 400 K/uL 20(LL) 18(LL) 16(LL)   -Patient's platelets are gradually improving up to 20k today. -We will give additional dose of Nplate 3 mcg per kilogram today -We will repeat second dose of IVIG in the next 2 to 3 days if platelets still less than 50k -Discussed preliminary bone marrow results with Dr. 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 -Hematology will continue to follow. -Appreciate excellent hospital medicine care.  Sullivan Lone MD MS

## 2020-07-08 NOTE — Progress Notes (Signed)
PROGRESS NOTE    Kathy Irwin  HYI:[id number] DOB: 18-May-1950 DOA: 07/05/2020 PCP: Kerin Perna, NP    Brief Narrative:Kathy Irwin is a 70 y.o. femalewith medical history significant ofchronic myelomonocytic leukemia, chronic diastolic heart failure, chronic anemia, diabetes mellitus type 2, hyperlipidemia, hypersensitivity pneumonitis, interstitial lung disease, GERD, thrombocytopenia. Patient presented from oncology office for acute on chronic low platelets of unknown etiology   Assessment & Plan:   Principal Problem:   Thrombocytopenia (HCC) Active Problems:   Hypersensitivity pneumonitis (HCC)   Obesity, Class III, BMI 40-49.9 (morbid obesity) ([place])   Essential hypertension   Postoperative wound infection of right hip   Hyperbilirubinemia   Idiopathic thrombocytopenia (HCC)   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HCC)   #1 ITP -Baseline platelets around 50-60,000.  Followed by Dr. Velvet Bathe as an outpatient.  Admission platelets were 17,000 down from 44,000 - 3 days prior to admission. She received Nplate 07/06/2020 platelets 20 up from 18 Oncology recommends platelet transfusion less than 10,000 or if she is bleeding. She is status post bone marrow biopsy 07/07/2020. Received 1 dose of Solu-Medrol.  #2 right hip infection is post intramedullary nail placement April 30, 2020.  She developed postop infection of the right hip with plans for irrigation and debridement.  This was canceled due to significant thrombocytopenia.  Appreciate ID input.  CT hip with  no significant fluid collection.ID rec to monitor off antibiotics.  #3 ANEMIA status post 1 unit of blood transfusion.  Hemoglobin 8.5 after 1 unit of blood transfusion.  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  #5 type 2 diabetes on Lantus CBG (last 3)  Recent Labs    07/07/20 2109 07/08/20 0801  07/08/20 1212  GLUCAP 190* 102* 140*    #6 chronic myelomonocytic leukemia followed by oncology  #7 chronic diastolic heart failure/hypertension on Toprol and Lasix.  #8 hyperlipidemia on statin  #9 GERD on Protonix  #10 morbid obesity  Estimated body mass index is 41.2 kg/m as calculated from the following:   Height as of this encounter: _0  (1.626 m).   Weight as of this encounter: 108.9 kg.  DVT prophylaxis: SCD due to thrombocytopenia and anemia Code Status: Full code Family Communication: None at bedside Disposition Plan:  Status is: Observation  Dispo: The patient is from: Home              Anticipated d/c is to: SNF              Patient currently is not medically stable to d/c.   Difficult to place patient    Consultants:   Oncology and infectious disease  Procedures: Bone marrow biopsy 07/07/2020 Antimicrobials: None  Subjective: She is resting in bed no new c/o no bleeding has ongoing hip pain Walked with PT does not want to go to snf   Objective: Vitals:   07/07/20 2058 07/08/20 0543 07/08/20 0818 07/08/20 1038  BP: 118/67 (!) 112/57  (!) 114/58  Pulse: 77 83  80  Resp: 16 16    Temp: 98 F (36.7 C) 98 F (36.7 C)    TempSrc: Oral Oral    SpO2: 98% 92% 99%   Weight:      Height:        Intake/Output Summary (Last 24 hours) at 07/08/2020 1256 Last data filed at 07/08/2020 0930 Gross per 24 hour  Intake 840 ml  Output 800 ml  Net 40 ml   Filed Weights   07/05/20 2000  Weight: 108.9 kg    Examination:  General exam: Appears older than stated age Respiratory system: Clear to auscultation. Respiratory effort normal. Cardiovascular system: S1 & S2 heard, RRR. No JVD, murmurs, rubs, gallops or clicks. No pedal edema. Gastrointestinal system: Abdomen is nondistended, soft and nontender. No organomegaly or masses felt. Normal bowel sounds heard. Central nervous system: Alert and oriented. No focal neurological deficits. Extremities: 1+  edema Skin: No rashes, lesions or ulcers Psychiatry: Judgement and insight appear normal. Mood & affect appropriate.     Data Reviewed: I have personally reviewed following labs and imaging studies  CBC: Recent Labs  Lab 07/02/20 0625 07/05/20 1442 07/06/20 0632 07/07/20 0540 07/08/20 1044  WBC 5.7 4.9 4.2 4.0 3.4*  NEUTROABS  --  1.3*  --  1.5* 1.0*  HGB 8.1* 7.9* 8.3* 8.5* 8.1*  HCT 27.7* 26.8* 27.1* 28.5* 27.4*  MCV 103.4* 102.7* 101.5* 105.2* 103.0*  PLT 44* 17* 16* 18* 20*   Basic Metabolic Panel: Recent Labs  Lab 07/05/20 1442 07/06/20 0632  NA 139 138  K 4.2 3.8  CL 106 105  CO2 25 27  GLUCOSE 145* 113*  BUN 9 8  CREATININE 0.68 0.54  CALCIUM 9.6 9.2   GFR: Estimated Creatinine Clearance: 78.9 mL/min (by C-G formula based on SCr of 0.54 mg/dL). Liver Function Tests: Recent Labs  Lab 07/05/20 1442 07/05/20 2032 07/06/20 0632  AST 13*  --  13*  ALT 10  --  10  ALKPHOS 71  --  60  BILITOT 2.3* 2.1* 2.5*  PROT 6.6  --  5.8*  ALBUMIN 3.5  --  3.3*   No results for input(s): LIPASE, AMYLASE in the last 168 hours. No results for input(s): AMMONIA in the last 168 hours. Coagulation Profile: No results for input(s): INR, PROTIME in the last 168 hours. Cardiac Enzymes: No results for input(s): CKTOTAL, CKMB, CKMBINDEX, TROPONINI in the last 168 hours. BNP (last 3 results) No results for input(s): PROBNP in the last 8760 hours. HbA1C: No results for input(s): HGBA1C in the last 72 hours. CBG: Recent Labs  Lab 07/07/20 1147 07/07/20 1637 07/07/20 2109 07/08/20 0801 07/08/20 1212  GLUCAP 186* 137* 190* 102* 140*   Lipid Profile: No results for input(s): CHOL, HDL, LDLCALC, TRIG, CHOLHDL, LDLDIRECT in the last 72 hours. Thyroid Function Tests: No results for input(s): TSH, T4TOTAL, FREET4, T3FREE, THYROIDAB in the last 72 hours. Anemia Panel: Recent Labs    07/05/20 1443  FERRITIN 792*  TIBC 203*  IRON 78   Sepsis Labs: No results for  input(s): PROCALCITON, LATICACIDVEN in the last 168 hours.  Recent Results (from the past 240 hour(s))  SARS Coronavirus 2 by RT PCR (hospital order, performed in [facility] hospital lab) Nasopharyngeal Nasopharyngeal Swab     Status: None   Collection Time: 07/02/20  5:39 AM   Specimen: Nasopharyngeal Swab  Result Value Ref Range Status   SARS Coronavirus 2 NEGATIVE NEGATIVE Final    Comment: (NOTE) SARS-CoV-2 target nucleic acids are NOT DETECTED.  The SARS-CoV-2 RNA is generally detectable in upper and lower respiratory specimens during the acute phase of infection. The lowest concentration of SARS-CoV-2 viral copies this assay can detect is 250 copies / mL. A negative result does not preclude SARS-CoV-2 infection and should not be used as the sole basis for treatment or other patient management decisions.  A negative result may occur with  improper specimen collection / handling, submission of specimen other than nasopharyngeal swab, presence of viral mutation(s) within the areas targeted by this assay, and inadequate number of viral copies (<250 copies / mL). A negative result must be combined with clinical observations, patient history, and epidemiological information.  Fact Sheet for Patients:   StrictlyIdeas.no  Fact Sheet for Healthcare Providers: BankingDealers.co.za  This test is not yet approved or  cleared by the [place] FDA and has been authorized for detection and/or diagnosis of SARS-CoV-2 by FDA under an Emergency Use Authorization (EUA).  This EUA will remain in effect (meaning this test can be used) for the duration of the COVID-19 declaration under Section 564(b)(1) of the Act, 21 U.S.C. section 360bbb-3(b)(1), unless the authorization is terminated or revoked sooner.  Performed at [facility], [place] [address], [place], [place] [address]   Resp Panel by RT-PCR (Flu A&B, Covid) Nasopharyngeal Swab      Status: None   Collection Time: 07/05/20  5:06 PM   Specimen: Nasopharyngeal Swab; Nasopharyngeal(NP) swabs in vial transport medium  Result Value Ref Range Status   SARS Coronavirus 2 by RT PCR NEGATIVE NEGATIVE Final    Comment: (NOTE) SARS-CoV-2 target nucleic acids are NOT DETECTED.  The SARS-CoV-2 RNA is generally detectable in upper respiratory specimens during the acute phase of infection. The lowest concentration of SARS-CoV-2 viral copies this assay can detect is 138 copies/mL. A negative result does not preclude SARS-Cov-2 infection and should not be used as the sole basis for treatment or other patient management decisions. A negative result may occur with  improper specimen collection/handling, submission of specimen other than nasopharyngeal swab, presence of viral mutation(s) within the areas targeted by this assay, and inadequate number of viral copies(<138 copies/mL). A negative result must be combined with clinical observations, patient history, and epidemiological information. The expected result is Negative.  Fact Sheet for Patients:  EntrepreneurPulse.com.au  Fact Sheet for Healthcare Providers:  IncredibleEmployment.be  This test is no t yet approved or cleared by the [place] FDA and  has been authorized for detection and/or diagnosis of SARS-CoV-2 by FDA under an Emergency Use Authorization (EUA). This EUA will remain  in effect (meaning this test can be used) for the duration of the COVID-19 declaration under Section 564(b)(1) of the Act, 21 U.S.C.section 360bbb-3(b)(1), unless the authorization is terminated  or revoked sooner.       Influenza A by PCR NEGATIVE NEGATIVE Final   Influenza B by PCR NEGATIVE NEGATIVE Final    Comment: (NOTE) The Xpert Xpress SARS-CoV-2/FLU/RSV plus assay is intended as an aid in the diagnosis of influenza from Nasopharyngeal swab specimens and should not be used as a sole basis  for treatment. Nasal washings and aspirates are unacceptable for Xpert Xpress SARS-CoV-2/FLU/RSV testing.  Fact Sheet for Patients: EntrepreneurPulse.com.au  Fact Sheet for Healthcare Providers: IncredibleEmployment.be  This test is not yet approved or cleared by the [place] FDA and has been authorized for detection and/or diagnosis of SARS-CoV-2 by FDA under an Emergency Use Authorization (EUA). This EUA will remain in effect (meaning this test can be used) for the duration of the COVID-19 declaration under Section 564(b)(1) of the Act, 21 U.S.C. section 360bbb-3(b)(1), unless the authorization is terminated or revoked.  Performed at [facility], [place] [address] [place], [place] [address]   Culture, blood (routine x 2)     Status: None (Preliminary result)   Collection Time: 07/06/20  1:47 PM   Specimen:  BLOOD  Result Value Ref Range Status   Specimen Description   Final    BLOOD RIGHT ANTECUBITAL Performed at [facility] [address], [place], [place] [address]    Special Requests   Final    BOTTLES DRAWN AEROBIC ONLY Blood Culture adequate volume Performed at [facility] [address] [place], [place] [address]    Culture   Final    NO GROWTH 2 DAYS Performed at [facility] [address], [place], [place] [address]    Report Status PENDING  Incomplete  Culture, blood (routine x 2)     Status: None (Preliminary result)   Collection Time: 07/06/20  1:47 PM   Specimen: BLOOD RIGHT HAND  Result Value Ref Range Status   Specimen Description   Final    BLOOD RIGHT HAND Performed at [facility] [address], [place], [place] [address]    Special Requests   Final    BOTTLES DRAWN AEROBIC ONLY Blood Culture adequate volume Performed at [facility] [address] [place], [place] [address]    Culture   Final    NO  GROWTH 2 DAYS Performed at [facility] [address], [place], [place] [address]    Report Status PENDING  Incomplete         Radiology Studies: CT HIP RIGHT W CONTRAST  Result Date: 07/07/2020 CLINICAL DATA:  Thrombocytopenia, right subtrochanteric fracture. Draining wound for 4 weeks. EXAM: CT OF THE LOWER RIGHT EXTREMITY WITH CONTRAST TECHNIQUE: Multidetector CT imaging of the lower right extremity was performed according to the standard protocol following intravenous contrast administration. CONTRAST:  132m OMNIPAQUE IOHEXOL 300 MG/ML  SOLN COMPARISON:  05/10/2020 FINDINGS: Bones/Joint/Cartilage Ununited comminuted right intertrochanteric fracture transfixed with a intramedullary nail and interlocking femoral neck screws and mild callus formation across the bone fragments. Persistent low-attenuation material around the fracture fragments may reflect residual hemorrhagic material, but infection cannot be excluded. With a soft tissue track extending towards the skin surface beyond the field of view. No drainable fluid collection. No acute hardware failure or complication. 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 Normal alignment. No joint effusion. Ligaments Ligaments are suboptimally evaluated by CT. Muscles and Tendons Muscles are normal.  No muscle atrophy. Soft tissue No fluid collection or hematoma.  No soft tissue mass. IMPRESSION: 1. Ununited comminuted right intertrochanteric fracture transfixed with a intramedullary nail and interlocking femoral neck screws and mild callus formation across the bone fragments. Persistent low-attenuation material around the fracture fragments may reflect residual hemorrhagic material, but infection cannot be excluded. With a soft tissue track extending towards the skin surface beyond the field of view. No drainable fluid collection. If there is persistent clinical concern regarding soft tissue infection extending to the fracture site, an MRI  of the right hip utilizing mars protocol may be helpful for characterization. Electronically Signed   By: HKathreen Devoid  On: 07/07/2020 10:35   CT BIOPSY  Result Date: 07/07/2020 INDICATION: Thrombocytopenia, history of CML EXAM: CT GUIDED RIGHT ILIAC BONE MARROW ASPIRATION AND CORE BIOPSY Date:  07/07/2020 07/07/2020 10:21 am Radiologist:  M. TDaryll Brod MD Guidance:  CT FLUOROSCOPY TIME:  Fluoroscopy Time: NONE. MEDICATIONS: 1% lidocaine ANESTHESIA/SEDATION: 2.0 mg IV Versed; 100 mcg IV Fentanyl Moderate Sedation Time:  12 minutes The patient was continuously monitored during the procedure by the interventional radiology nurse under my direct supervision. CONTRAST:  1055mOMNIPAQUE IOHEXOL 300 MG/ML  SOLN COMPLICATIONS: None PROCEDURE: Informed consent was obtained from the patient following explanation of the procedure, risks, benefits and alternatives. The patient understands, agrees and consents for the procedure. All questions were addressed. A time out was performed. The patient was positioned prone and non-contrast localization CT was performed of the pelvis to demonstrate the iliac marrow spaces. Maximal barrier sterile technique utilized including caps, mask, sterile gowns, sterile gloves, large sterile drape, hand hygiene, and Betadine prep. Under sterile conditions and local anesthesia, an 11 gauge coaxial bone biopsy needle was advanced into the right iliac marrow space. Needle position was confirmed with CT imaging. Initially, bone marrow aspiration was performed. Next, the 11 gauge outer cannula was utilized to obtain a right iliac bone marrow core biopsy. Needle was removed. Hemostasis was obtained with compression. The patient tolerated the procedure well. Samples were prepared with the cytotechnologist. No immediate complications. IMPRESSION: CT guided right iliac bone marrow aspiration and core biopsy. Electronically Signed   By: Jerilynn Mages.  Shick M.D.   On: 07/07/2020 11:46   CT BONE MARROW BIOPSY  & ASPIRATION  Result Date: 07/07/2020 INDICATION: Thrombocytopenia, history of CML EXAM: CT GUIDED RIGHT ILIAC BONE MARROW ASPIRATION AND CORE BIOPSY Date:  07/07/2020 07/07/2020 10:21 am Radiologist:  M. Daryll Brod, MD Guidance:  CT FLUOROSCOPY TIME:  Fluoroscopy Time: NONE. MEDICATIONS: 1% lidocaine ANESTHESIA/SEDATION: 2.0 mg IV Versed; 100 mcg IV Fentanyl Moderate Sedation Time:  12 minutes The patient was continuously monitored during the procedure by the interventional radiology nurse under my direct supervision. CONTRAST:  154m OMNIPAQUE IOHEXOL 300 MG/ML  SOLN COMPLICATIONS: None PROCEDURE: Informed consent was obtained from the patient following explanation of the procedure, risks, benefits and alternatives. The patient understands, agrees and consents for the procedure. All questions were addressed. A time out was performed. The patient was positioned prone and non-contrast localization CT was performed of the pelvis to demonstrate the iliac marrow spaces. Maximal barrier sterile technique utilized including caps, mask, sterile gowns, sterile gloves, large sterile drape, hand hygiene, and Betadine prep. Under sterile conditions and local anesthesia, an 11 gauge coaxial bone biopsy needle was advanced into the right iliac marrow space. Needle position was confirmed with CT imaging. Initially, bone marrow aspiration was performed. Next, the 11 gauge outer cannula was utilized to obtain a right iliac bone marrow core biopsy. Needle was removed. Hemostasis was obtained with compression. The patient tolerated the procedure well. Samples were prepared with the cytotechnologist. No immediate complications. IMPRESSION: CT guided right iliac bone marrow aspiration and core biopsy. Electronically Signed   By: MJerilynn Mages  Shick M.D.   On: 07/07/2020 11:46        Scheduled Meds: . sodium chloride   Intravenous Once  . cholecalciferol  5,000 Units Oral Daily  . famotidine  40 mg Oral Daily  . furosemide  20 mg  Oral Daily  . insulin aspart  0-15 Units Subcutaneous TID WC  . insulin glargine  10 Units Subcutaneous Daily  . metoprolol succinate  75 mg Oral Daily  . mometasone-formoterol  2 puff Inhalation BID  . montelukast  10 mg Oral QHS  . olopatadine  1 drop Both Eyes BID  . pantoprazole  40 mg Oral Daily  . pravastatin  40 mg Oral QHS  . pregabalin  100 mg Oral QHS  . romiPLOStim  2 mcg/kg Subcutaneous Weekly  . sertraline  100 mg Oral QHS  . traZODone  100 mg Oral QHS   Continuous Infusions:   LOS: 1 day  Georgette Shell, MD  07/08/2020, 12:56 PM

## 2020-07-08 NOTE — Progress Notes (Addendum)
Physical Therapy Treatment Patient Details Name: Kathy Irwin MRN: [id number] DOB: 1951/02/11 Today's Date: 07/08/2020    History of Present Illness 70 y.o. female with medical history significant of chronic myelomonocytic leukemia, chronic diastolic heart failure, chronic anemia, diabetes mellitus type 2, hyperlipidemia, hypersensitivity pneumonitis, interstitial lung disease, GERD, thrombocytopenia. Recent hip fracture in january 2022. I & D for R hip infection postponed 2* thrombocytopenia.     PT Comments    Pt reports she's too fatigued to attempt mobility, "my body is saying it needs rest". She stated she was up in the recliner for a couple hours this morning. She agreed to bed level RLE exercises.    Follow Up Recommendations  Home health PT     Equipment Recommendations  None recommended by PT    Recommendations for Other Services       Precautions / Restrictions Precautions Precautions: Fall Restrictions Weight Bearing Restrictions: No    Mobility  Bed Mobility               General bed mobility comments: pt declined mobility, she stated she sat up in the recliner for 2 hrs this morning and is now very fatigued, agreed to bed level exercises    Transfers                    Ambulation/Gait                 Stairs             Wheelchair Mobility    Modified Rankin (Stroke Patients Only)       Balance                                            Cognition Arousal/Alertness: Awake/alert Behavior During Therapy: WFL for tasks assessed/performed Overall Cognitive Status: Within Functional Limits for tasks assessed                                        Exercises General Exercises - Lower Extremity Ankle Circles/Pumps: AROM;Both;10 reps;Supine Quad Sets: AROM;Right;5 reps;Supine Short Arc Quad: AROM;Right;10 reps;Supine Heel Slides: AAROM;Right;10 reps;Supine Hip ABduction/ADduction:  AROM;Right;10 reps;Supine Straight Leg Raises: AROM;Right;10 reps;Supine;AAROM    General Comments        Pertinent Vitals/Pain Pain Score: 3  Pain Location: R hip Pain Descriptors / Indicators: Grimacing;Guarding Pain Intervention(s): Limited activity within patient's tolerance;Monitored during session;Premedicated before session    Home Living                      Prior Function            PT Goals (current goals can now be found in the care plan section) Acute Rehab PT Goals Patient Stated Goal: DC home PT Goal Formulation: With patient Time For Goal Achievement: 07/20/20 Potential to Achieve Goals: Fair Progress towards PT goals: Not progressing toward goals - comment (too fatigued to walk today)    Frequency    Min 3X/week      PT Plan Current plan remains appropriate    Co-evaluation              AM-PAC PT "6 Clicks" Mobility   Outcome Measure  Help needed turning from your back to your side while in a  flat bed without using bedrails?: A Little Help needed moving from lying on your back to sitting on the side of a flat bed without using bedrails?: A Little Help needed moving to and from a bed to a chair (including a wheelchair)?: A Little Help needed standing up from a chair using your arms (e.g., wheelchair or bedside chair)?: A Little Help needed to walk in hospital room?: A Little Help needed climbing 3-5 steps with a railing? : A Little 6 Click Score: 18    End of Session   Activity Tolerance: Patient limited by fatigue Patient left: with call bell/phone within reach;in bed;with bed alarm set Nurse Communication: Mobility status PT Visit Diagnosis: Difficulty in walking, not elsewhere classified (R26.2);Pain Pain - Right/Left: Right Pain - part of body: Hip     Time: 8588-5027 PT Time Calculation (min) (ACUTE ONLY): 14 min  Charges:  $Therapeutic Activity: 8-22 mins                     Blondell Reveal Kistler PT 07/08/2020   Acute Rehabilitation Services Pager [phone] Office [phone]

## 2020-07-08 NOTE — Progress Notes (Signed)
Occupational Therapy Treatment Patient Details Name: Kathy Irwin MRN: [id number] DOB: 11/07/1950 Today's Date: 07/08/2020    History of present illness 70 y.o. female with medical history significant of chronic myelomonocytic leukemia, chronic diastolic heart failure, chronic anemia, diabetes mellitus type 2, hyperlipidemia, hypersensitivity pneumonitis, interstitial lung disease, GERD, thrombocytopenia. Recent hip fracture in january 2022. S/p bone marrow biopsy 3/23   OT comments  Patient progressing well with mobility, supervision level for functional transfers and ambulation. Patient was able to self correct hand placement for sit <> stand. Patient needed min A to thread L LE into mesh underwear, reporting soreness from yesterday's biopsy. Cont with POC.   Follow Up Recommendations  Home health OT;Supervision - Intermittent    Equipment Recommendations  None recommended by OT       Precautions / Restrictions Precautions Precautions: Fall Restrictions Weight Bearing Restrictions: No       Mobility Bed Mobility Overal bed mobility: Modified Independent                  Transfers Overall transfer level: Needs assistance Equipment used: Rolling walker (2 wheeled) Transfers: Sit to/from Stand Sit to Stand: Supervision         General transfer comment: able to self correct hand placement with standing from EOB    Balance Overall balance assessment: Needs assistance Sitting-balance support: Feet supported Sitting balance-Leahy Scale: Good     Standing balance support: Bilateral upper extremity supported Standing balance-Leahy Scale: Poor                             ADL either performed or assessed with clinical judgement   ADL Overall ADL's : Needs assistance/impaired     Grooming: Wash/dry face;Set up;Sitting               Lower Body Dressing: Minimal assistance;Sitting/lateral leans;Sit to/from stand Lower Body Dressing Details  (indicate cue type and reason): min A to initiate threading L LE through mesh underwear, able to thread R LE and don slippers at EOB. supervision level in standing to pull up underwear over hips Toilet Transfer: Supervision/safety;Ambulation;RW Toilet Transfer Details (indicate cue type and reason): supervision level this session for functional ambulation and transfer to recliner chair.         Functional mobility during ADLs: Supervision/safety;Rolling walker General ADL Comments: patient with less pain this AM improving overall mobility               Cognition Arousal/Alertness: Awake/alert Behavior During Therapy: WFL for tasks assessed/performed Overall Cognitive Status: Within Functional Limits for tasks assessed                                                     Pertinent Vitals/ Pain       Pain Assessment: Faces Faces Pain Scale: Hurts a little bit Pain Location: biopsy site Pain Descriptors / Indicators: Sore Pain Intervention(s): Monitored during session         Frequency  Min 2X/week        Progress Toward Goals  OT Goals(current goals can now be found in the care plan section)  Progress towards OT goals: Progressing toward goals  Acute Rehab OT Goals Patient Stated Goal: DC home OT Goal Formulation: With patient Time For Goal Achievement: 07/20/20 Potential to Achieve Goals:  Good ADL Goals Pt Will Perform Grooming: with modified independence;standing;sitting Pt Will Perform Upper Body Dressing: Independently Pt Will Perform Lower Body Dressing: with modified independence;sit to/from stand;sitting/lateral leans Pt Will Transfer to Toilet: with modified independence;ambulating;grab bars;regular height toilet (rollator) Pt Will Perform Toileting - Clothing Manipulation and hygiene: with modified independence;sitting/lateral leans;sit to/from stand  Plan Discharge plan remains appropriate       AM-PAC OT "6 Clicks" Daily Activity      Outcome Measure   Help from another person eating meals?: None Help from another person taking care of personal grooming?: A Little Help from another person toileting, which includes using toliet, bedpan, or urinal?: A Little Help from another person bathing (including washing, rinsing, drying)?: A Little Help from another person to put on and taking off regular upper body clothing?: A Little Help from another person to put on and taking off regular lower body clothing?: A Little 6 Click Score: 19    End of Session Equipment Utilized During Treatment: Rolling walker;Oxygen  OT Visit Diagnosis: Unsteadiness on feet (R26.81);Pain Pain - Right/Left: Right Pain - part of body: Leg   Activity Tolerance Patient tolerated treatment well   Patient Left in chair;with call bell/phone within reach;with chair alarm set   Nurse Communication Mobility status;Other (comment) (blood spot on bed pad, RN made aware)        Time: 2703-5009 OT Time Calculation (min): 21 min  Charges: OT General Charges $OT Visit: 1 Visit OT Treatments $Self Care/Home Management : 8-22 mins  Delbert Phenix OT OT pager: [place] 07/08/2020, 9:18 AM

## 2020-07-08 NOTE — Progress Notes (Signed)
PT Cancellation Note  Patient Details Name: Kathy Irwin MRN: [id number] DOB: [birth year]   Cancelled Treatment:    Reason Eval/Treat Not Completed: Fatigue/lethargy limiting ability to participate (pt sleeping soundly, requested PT try later today. Will follow.)  Philomena Doheny PT 07/08/2020  Acute Rehabilitation Services Pager [phone] Office [phone]

## 2020-07-09 DIAGNOSIS — C9310 Chronic myelomonocytic leukemia not having achieved remission: Secondary | ICD-10-CM | POA: Diagnosis not present

## 2020-07-09 DIAGNOSIS — D696 Thrombocytopenia, unspecified: Secondary | ICD-10-CM | POA: Diagnosis not present

## 2020-07-09 LAB — GLUCOSE, CAPILLARY
Glucose-Capillary: 101 mg/dL — ABNORMAL HIGH (ref 70–99)
Glucose-Capillary: 140 mg/dL — ABNORMAL HIGH (ref 70–99)
Glucose-Capillary: 151 mg/dL — ABNORMAL HIGH (ref 70–99)
Glucose-Capillary: 312 mg/dL — ABNORMAL HIGH (ref 70–99)

## 2020-07-09 LAB — CBC
HCT: 25.9 % — ABNORMAL LOW (ref 36.0–46.0)
Hemoglobin: 7.6 g/dL — ABNORMAL LOW (ref 12.0–15.0)
MCH: 30 pg (ref 26.0–34.0)
MCHC: 29.3 g/dL — ABNORMAL LOW (ref 30.0–36.0)
MCV: 102.4 fL — ABNORMAL HIGH (ref 80.0–100.0)
Platelets: 17 10*3/uL — CL (ref 150–400)
RBC: 2.53 MIL/uL — ABNORMAL LOW (ref 3.87–5.11)
RDW: 22.5 % — ABNORMAL HIGH (ref 11.5–15.5)
WBC: 4.2 10*3/uL (ref 4.0–10.5)
nRBC: 7 % — ABNORMAL HIGH (ref 0.0–0.2)

## 2020-07-09 LAB — PREPARE RBC (CROSSMATCH)

## 2020-07-09 LAB — SURGICAL PATHOLOGY

## 2020-07-09 MED ORDER — IMMUNE GLOBULIN (HUMAN) 10 GM/100ML IV SOLN
1.0000 g/kg | Freq: Once | INTRAVENOUS | Status: AC
  Administered 2020-07-09: 110 g via INTRAVENOUS
  Filled 2020-07-09: qty 1000

## 2020-07-09 MED ORDER — LIP MEDEX EX OINT
TOPICAL_OINTMENT | CUTANEOUS | Status: DC | PRN
  Filled 2020-07-09: qty 7

## 2020-07-09 MED ORDER — DIPHENHYDRAMINE HCL 25 MG PO CAPS
ORAL_CAPSULE | ORAL | Status: AC
  Administered 2020-07-09: 25 mg
  Filled 2020-07-09: qty 1

## 2020-07-09 MED ORDER — ROMIPLOSTIM INJECTION 500 MCG
5.0000 ug/kg | SUBCUTANEOUS | Status: DC
  Administered 2020-07-13: 545 ug via SUBCUTANEOUS
  Filled 2020-07-09: qty 1

## 2020-07-09 MED ORDER — SALINE SPRAY 0.65 % NA SOLN
1.0000 | NASAL | Status: DC | PRN
  Administered 2020-07-09 – 2020-07-12 (×2): 1 via NASAL
  Filled 2020-07-09: qty 44

## 2020-07-09 MED ORDER — DIPHENHYDRAMINE HCL 50 MG PO CAPS
ORAL_CAPSULE | ORAL | Status: AC
  Filled 2020-07-09: qty 1

## 2020-07-09 MED ORDER — METHYLPREDNISOLONE SODIUM SUCC 125 MG IJ SOLR
INTRAMUSCULAR | Status: AC
  Administered 2020-07-09: 60 mg
  Filled 2020-07-09: qty 2

## 2020-07-09 MED ORDER — SODIUM CHLORIDE 0.9% IV SOLUTION: Freq: Once | INTRAVENOUS | Status: AC

## 2020-07-09 MED ORDER — GUAIFENESIN-DM 100-10 MG/5ML PO SYRP
5.0000 mL | ORAL_SOLUTION | ORAL | Status: DC | PRN
  Administered 2020-07-09: 5 mL via ORAL
  Filled 2020-07-09 (×2): qty 10

## 2020-07-09 NOTE — Care Management Important Message (Signed)
Important Message  Patient Details IM Letter given to the Patient. Name: Kathy Irwin MRN: [id number] Date of Birth: [birth year]   Medicare Important Message Given:  Yes     Kerin Salen 07/09/2020, 10:40 AM

## 2020-07-09 NOTE — Progress Notes (Addendum)
HEMATOLOGY-ONCOLOGY PROGRESS NOTE  SUBJECTIVE:  Kathy Irwin was seen for hematology follow-up today.  No bleeding reported.  Continues to have pain to her right hip.  No fevers.  Notes no other acute new symptoms.  Good p.o. intake.  REVIEW OF SYSTEMS:   Constitutional: Denies fevers and chills Eyes: Denies blurriness of vision Ears, nose, mouth, throat, and face: Denies mucositis or sore throat Respiratory: Denies cough, dyspnea or wheezes Cardiovascular: Denies palpitation, chest discomfort Gastrointestinal:  Denies nausea, heartburn or change in bowel habits Skin: Denies abnormal skin rashes Lymphatics: Denies new lymphadenopathy or easy bruising Neurological:Denies numbness, tingling or new weaknesses Behavioral/Psych: Mood is stable, no new changes  Extremities: No lower extremity edema All other systems were reviewed with the patient and are negative.  I have reviewed the past medical history, past surgical history, social history and family history with the patient and they are unchanged from previous note.   PHYSICAL EXAMINATION: ECOG PERFORMANCE STATUS: 2 - Symptomatic, <50% confined to bed  Vitals:   07/09/20 0524 07/09/20 0741  BP: 108/70   Pulse: 87   Resp: 20   Temp: 98.3 F (36.8 C)   SpO2: 90% 92%   Filed Weights   07/05/20 2000  Weight: 108.9 kg    Intake/Output from previous day: 03/24 0701 - 03/25 0700 In: 720 [P.O.:720] Out: 1300 [Urine:1300]  GENERAL:alert, no distress and comfortable SKIN: skin color, texture, turgor are normal, no rashes or significant lesions EYES: normal, Conjunctiva are pink and non-injected, sclera clear OROPHARYNX:no exudate, no erythema and lips, buccal mucosa, and tongue normal  NECK: supple, thyroid normal size, non-tender, without nodularity LYMPH:  no palpable lymphadenopathy in the cervical, axillary or inguinal LUNGS: clear to auscultation and percussion with normal breathing effort HEART: regular rate & rhythm and  no murmurs and no lower extremity edema ABDOMEN:abdomen soft, non-tender and normal bowel sounds Musculoskeletal:no cyanosis of digits and no clubbing  NEURO: alert & oriented x 3 with fluent speech, no focal motor/sensory deficits  LABORATORY DATA:  I have reviewed the data as listed CMP Latest Ref Rng & Units 07/06/2020 07/05/2020 07/05/2020  Glucose 70 - 99 mg/dL 113(H) 145(H) -  BUN 8 - 23 mg/dL 8 9 -  Creatinine 0.44 - 1.00 mg/dL 0.54 0.68 -  Sodium 135 - 145 mmol/L 138 139 -  Potassium 3.5 - 5.1 mmol/L 3.8 4.2 -  Chloride 98 - 111 mmol/L 105 106 -  CO2 22 - 32 mmol/L 27 25 -  Calcium 8.9 - 10.3 mg/dL 9.2 9.6 -  Total Protein 6.5 - 8.1 g/dL 5.8(L) 6.6 -  Total Bilirubin 0.3 - 1.2 mg/dL 2.5(H) 2.1(H) 2.3(H)  Alkaline Phos 38 - 126 U/L 60 71 -  AST 15 - 41 U/L 13(L) 13(L) -  ALT 0 - 44 U/L 10 10 -    Lab Results  Component Value Date   WBC 4.2 07/09/2020   HGB 7.6 (L) 07/09/2020   HCT 25.9 (L) 07/09/2020   MCV 102.4 (H) 07/09/2020   PLT 17 (LL) 07/09/2020   NEUTROABS 1.0 (L) 07/08/2020    CT HIP RIGHT WO CONTRAST  Result Date: 06/23/2020 CLINICAL DATA:  Recent right hip ORIF for intertrochanteric fracture presenting with low platelets and anemia. EXAM: CT OF THE RIGHT HIP WITHOUT CONTRAST TECHNIQUE: Multidetector CT imaging of the right hip was performed according to the standard protocol. Multiplanar CT image reconstructions were also generated. COMPARISON:  Radiographs 04/30/2020 FINDINGS: Bones/Joint/Cartilage Redemonstration of the a highly comminuted inter trochanteric right femur   fracture post intramedullary nail placement with transcervical pinning. There is some developing callus formation about the numerous fracture fragments. Some low-intermediate attenuation surrounding the fracture fragments likely reflects a small amount of intramedullary hematoma with additional seroma in stranding extending to the skin surface of the lateral soft tissues of hip. No acute hardware  complication or failure is seen otherwise. There is a background of severe degenerative changes in the left hip including larger geode formation along the articular surface of the femoral head as well as extensive periarticular spurring about the acetabulum. No acute fracture of the included bones of the pelvis is seen. Ligaments Suboptimally assessed by CT. Muscles and Tendons Intramuscular stranding and thickening a seen along the surgical approach the patient's intramedullary nail placement. Small of intramuscular thickening and possible hematoma/seroma subjacent to the iliotibial band. No frankly or retracted tendons nor other acute musculotendinous injury. Soft tissues Soft tissue swelling and edematous changes of the right lower extremity most pronounced laterally with scar stranding and scarring related to the surgical approach. No soft tissue gas or unexpected foreign body is seen. Included portions of the pelvis are free of acute abnormality. IMPRESSION: 1. Redemonstration of the highly comminuted inter trochanteric right femur fracture post intramedullary nail placement with transcervical pinning. Developing callus formation about the numerous fracture fragments. Some minimal low-intermediate attenuation surrounding the fracture fragments likely reflects only a small amount residual hematoma or seroma with additional scarring and thickening extending to the skin surface of the lateral soft tissues of the hip. 2. No other acute osseous injury or traumatic malalignment. No other large collection or hematoma is seen. 3. Background of severe degenerative changes in the left hip. Electronically Signed   By: Price  DeHay M.D.   On: 06/23/2020 22:21   CT HIP RIGHT W CONTRAST  Result Date: 07/07/2020 CLINICAL DATA:  Thrombocytopenia, right subtrochanteric fracture. Draining wound for 4 weeks. EXAM: CT OF THE LOWER RIGHT EXTREMITY WITH CONTRAST TECHNIQUE: Multidetector CT imaging of the lower right extremity  was performed according to the standard protocol following intravenous contrast administration. CONTRAST:  100mL OMNIPAQUE IOHEXOL 300 MG/ML  SOLN COMPARISON:  05/10/2020 FINDINGS: Bones/Joint/Cartilage Ununited comminuted right intertrochanteric fracture transfixed with a intramedullary nail and interlocking femoral neck screws and mild callus formation across the bone fragments. Persistent low-attenuation material around the fracture fragments may reflect residual hemorrhagic material, but infection cannot be excluded. With a soft tissue track extending towards the skin surface beyond the field of view. No drainable fluid collection. No acute hardware failure or complication. 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 Normal alignment. No joint effusion. Ligaments Ligaments are suboptimally evaluated by CT. Muscles and Tendons Muscles are normal.  No muscle atrophy. Soft tissue No fluid collection or hematoma.  No soft tissue mass. IMPRESSION: 1. Ununited comminuted right intertrochanteric fracture transfixed with a intramedullary nail and interlocking femoral neck screws and mild callus formation across the bone fragments. Persistent low-attenuation material around the fracture fragments may reflect residual hemorrhagic material, but infection cannot be excluded. With a soft tissue track extending towards the skin surface beyond the field of view. No drainable fluid collection. If there is persistent clinical concern regarding soft tissue infection extending to the fracture site, an MRI of the right hip utilizing mars protocol may be helpful for characterization. Electronically Signed   By: Hetal  Patel   On: 07/07/2020 10:35   CT BIOPSY  Result Date: 07/07/2020 INDICATION: Thrombocytopenia, history of CML EXAM: CT GUIDED RIGHT ILIAC BONE MARROW ASPIRATION AND CORE   BIOPSY Date:  07/07/2020 07/07/2020 10:21 am Radiologist:  M. Trevor Shick, MD Guidance:  CT FLUOROSCOPY TIME:  Fluoroscopy Time: NONE.  MEDICATIONS: 1% lidocaine ANESTHESIA/SEDATION: 2.0 mg IV Versed; 100 mcg IV Fentanyl Moderate Sedation Time:  12 minutes The patient was continuously monitored during the procedure by the interventional radiology nurse under my direct supervision. CONTRAST:  100mL OMNIPAQUE IOHEXOL 300 MG/ML  SOLN COMPLICATIONS: None PROCEDURE: Informed consent was obtained from the patient following explanation of the procedure, risks, benefits and alternatives. The patient understands, agrees and consents for the procedure. All questions were addressed. A time out was performed. The patient was positioned prone and non-contrast localization CT was performed of the pelvis to demonstrate the iliac marrow spaces. Maximal barrier sterile technique utilized including caps, mask, sterile gowns, sterile gloves, large sterile drape, hand hygiene, and Betadine prep. Under sterile conditions and local anesthesia, an 11 gauge coaxial bone biopsy needle was advanced into the right iliac marrow space. Needle position was confirmed with CT imaging. Initially, bone marrow aspiration was performed. Next, the 11 gauge outer cannula was utilized to obtain a right iliac bone marrow core biopsy. Needle was removed. Hemostasis was obtained with compression. The patient tolerated the procedure well. Samples were prepared with the cytotechnologist. No immediate complications. IMPRESSION: CT guided right iliac bone marrow aspiration and core biopsy. Electronically Signed   By: M.  Shick M.D.   On: 07/07/2020 11:46   CT BONE MARROW BIOPSY & ASPIRATION  Result Date: 07/07/2020 INDICATION: Thrombocytopenia, history of CML EXAM: CT GUIDED RIGHT ILIAC BONE MARROW ASPIRATION AND CORE BIOPSY Date:  07/07/2020 07/07/2020 10:21 am Radiologist:  M. Trevor Shick, MD Guidance:  CT FLUOROSCOPY TIME:  Fluoroscopy Time: NONE. MEDICATIONS: 1% lidocaine ANESTHESIA/SEDATION: 2.0 mg IV Versed; 100 mcg IV Fentanyl Moderate Sedation Time:  12 minutes The patient was  continuously monitored during the procedure by the interventional radiology nurse under my direct supervision. CONTRAST:  100mL OMNIPAQUE IOHEXOL 300 MG/ML  SOLN COMPLICATIONS: None PROCEDURE: Informed consent was obtained from the patient following explanation of the procedure, risks, benefits and alternatives. The patient understands, agrees and consents for the procedure. All questions were addressed. A time out was performed. The patient was positioned prone and non-contrast localization CT was performed of the pelvis to demonstrate the iliac marrow spaces. Maximal barrier sterile technique utilized including caps, mask, sterile gowns, sterile gloves, large sterile drape, hand hygiene, and Betadine prep. Under sterile conditions and local anesthesia, an 11 gauge coaxial bone biopsy needle was advanced into the right iliac marrow space. Needle position was confirmed with CT imaging. Initially, bone marrow aspiration was performed. Next, the 11 gauge outer cannula was utilized to obtain a right iliac bone marrow core biopsy. Needle was removed. Hemostasis was obtained with compression. The patient tolerated the procedure well. Samples were prepared with the cytotechnologist. No immediate complications. IMPRESSION: CT guided right iliac bone marrow aspiration and core biopsy. Electronically Signed   By: M.  Shick M.D.   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 ITP is likely since platelets improved significantly with recent steroid use. Additional factors worsening her platelet count are infection in her right hip surgical site with drainage and absorption of platelets, multiple lines of antibiotics etc. 3) Macrocytic anemia -possibly from blood loss with surgery and ongoing drainage.  Could also be from CMML.    Labs in stable at this time.  Hemoglobin  improved after receiving 1 unit PRBC. PLAN:  -CBC from today has been reviewed and hemoglobin is down slightly to 7.6 and platelets are also down slightly to 17,000.  She has no bleeding today.  Recommend PRBC transfusion for hemoglobin less than 8 and platelet transfusion for platelet count less than 10,000 or active bleeding.  Will order 1 unit PRBCs today. -Patient tolerated IVIG and Nplate without any acute issues.  She received a repeat dose of Nplate at 3 mcg/kg on 07/08/2020.  We may also consider repeating a dose of IVIG later today or tomorrow if platelets remain 50,000. -Patient had bone marrow biopsy performed 07/07/2020 and tolerated the procedure well.  Final bone marrow biopsy results are still pending.  However,  preliminary bone marrow results with Dr. 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 [facility] medicine and infectious disease are following to determine management of the patient's nonhealing right hip fracture issue with chronic drainage -Hematology will continue to follow  Mikey Bussing 07/09/20    ADDENDUM  .Patient was Personally and independently interviewed, examined and relevant elements of the history of present illness were reviewed in details and an assessment and plan was created. All elements of the patient's history of present illness , assessment and plan were discussed in details with Mikey Bussing DNP. The above documentation reflects our combined findings assessment and plan.  PLT 17k Ordered for and receiving 2 nd dose of IVIG 1g/kg. Received Nplate 2nd dose of 85mg/kg yesterday Awaiting final bone marrow biopsy result. Prelim - CMML with increased Megakaryocytes suggestive of addition ITP. Transfuse platelets if <10k or bleeding Will f/u on Monday.  GSullivan LoneMD MS

## 2020-07-09 NOTE — Progress Notes (Signed)
PROGRESS NOTE    Kathy Irwin  KGU:[id number] DOB: Jun 30, 1950 DOA: 07/05/2020 PCP: Kerin Perna, NP    Brief Narrative:Kathy Irwin is a 70 y.o. femalewith medical history significant ofchronic myelomonocytic leukemia, chronic diastolic heart failure, chronic anemia, diabetes mellitus type 2, hyperlipidemia, hypersensitivity pneumonitis, interstitial lung disease, GERD, thrombocytopenia. Patient presented from oncology office for acute on chronic low platelets of unknown etiology   Assessment & Plan:   Principal Problem:   Thrombocytopenia (HCC) Active Problems:   Hypersensitivity pneumonitis (HCC)   Obesity, Class III, BMI 40-49.9 (morbid obesity) ([place])   Essential hypertension   Postoperative wound infection of right hip   Hyperbilirubinemia   Idiopathic thrombocytopenia (HCC)   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HCC)   #1 ITP -Baseline platelets around 50-60,000.  Followed by Dr. Velvet Bathe as an outpatient.  Admission platelets were 17,000 down from 44,000 - 3 days prior to admission. She received Nplate 07/06/2020 platelets 20 up from 18 Oncology recommends platelet transfusion less than 10,000 or if she is bleeding. She is status post bone marrow biopsy 07/07/2020. Received 1 dose of Solu-Medrol. Defer to oncology regarding IVIG/N plate  #2 right hip infection is post intramedullary nail placement April 30, 2020.  She developed postop infection of the right hip with plans for irrigation and debridement.  This was canceled due to significant thrombocytopenia.  Appreciate ID input.  CT hip with  no significant fluid collection.ID rec to monitor off antibiotics.  #3 ANEMIA -hemoglobin down to 7.6 from 8.5.  She received 1 unit of blood transfusion during this hospital stay.  Will order another unit of packed RBC today.    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  #5 type 2 diabetes on Lantus CBG (last 3)  Recent Labs    07/08/20 2217 07/09/20 0742 07/09/20 1137  GLUCAP 99 101* 140*    #6 chronic myelomonocytic leukemia followed by oncology  #7 chronic diastolic heart failure/hypertension on Toprol and Lasix.  #8 hyperlipidemia on statin  #9 GERD on Protonix  #10 morbid obesity  Estimated body mass index is 41.2 kg/m as calculated from the following:   Height as of this encounter: _0  (1.626 m).   Weight as of this encounter: 108.9 kg.  DVT prophylaxis: SCD due to thrombocytopenia and anemia Code Status: Full code Family Communication: None at bedside Disposition Plan:  Status is: Observation  Dispo: The patient is from: Home              Anticipated d/c is to: SNF              Patient currently is not medically stable to d/c.   Difficult to place patient    Consultants:   Oncology and infectious disease  Procedures: Bone marrow biopsy 07/07/2020 Antimicrobials: None  Subjective: Patient resting in bed complains of a lot of anxiety Objective: Vitals:   07/09/20 1128 07/09/20 1227 07/09/20 1248 07/09/20 1305  BP: 123/60 118/62 134/62 127/68  Pulse: 93 84 81 (!) 57  Resp:  _1 Temp: 98.2 F (36.8 C) 99.6 F (37.6 C) 99.1 F (37.3 C) 98.9 F (37.2 C)  TempSrc: Oral Oral Oral   SpO2: 100% 96% 99% 96%  Weight:      Height:        Intake/Output Summary (Last 24 hours) at 07/09/2020 1331 Last data filed at 07/09/2020 1000 Gross  per 24 hour  Intake 720 ml  Output 1500 ml  Net -780 ml   Filed Weights   07/05/20 2000  Weight: 108.9 kg    Examination:  General exam: Appears older than stated age Respiratory system: Clear to auscultation. Respiratory effort normal. Cardiovascular system: S1 & S2 heard, RRR. No JVD, murmurs, rubs, gallops or clicks. No pedal edema. Gastrointestinal system: Abdomen is nondistended, soft and nontender. No organomegaly or masses felt. Normal bowel sounds  heard. Central nervous system: Alert and oriented. No focal neurological deficits. Extremities: 1+ edema Skin: No rashes, lesions or ulcers Psychiatry: Judgement and insight appear normal. Mood & affect appropriate.     Data Reviewed: I have personally reviewed following labs and imaging studies  CBC: Recent Labs  Lab 07/05/20 1442 07/06/20 0632 07/07/20 0540 07/08/20 1044 07/09/20 0554  WBC 4.9 4.2 4.0 3.4* 4.2  NEUTROABS 1.3*  --  1.5* 1.0*  --   HGB 7.9* 8.3* 8.5* 8.1* 7.6*  HCT 26.8* 27.1* 28.5* 27.4* 25.9*  MCV 102.7* 101.5* 105.2* 103.0* 102.4*  PLT 17* 16* 18* 20* 17*   Basic Metabolic Panel: Recent Labs  Lab 07/05/20 1442 07/06/20 0632  NA 139 138  K 4.2 3.8  CL 106 105  CO2 25 27  GLUCOSE 145* 113*  BUN 9 8  CREATININE 0.68 0.54  CALCIUM 9.6 9.2   GFR: Estimated Creatinine Clearance: 78.9 mL/min (by C-G formula based on SCr of 0.54 mg/dL). Liver Function Tests: Recent Labs  Lab 07/05/20 1442 07/05/20 2032 07/06/20 0632  AST 13*  --  13*  ALT 10  --  10  ALKPHOS 71  --  60  BILITOT 2.3* 2.1* 2.5*  PROT 6.6  --  5.8*  ALBUMIN 3.5  --  3.3*   No results for input(s): LIPASE, AMYLASE in the last 168 hours. No results for input(s): AMMONIA in the last 168 hours. Coagulation Profile: No results for input(s): INR, PROTIME in the last 168 hours. Cardiac Enzymes: No results for input(s): CKTOTAL, CKMB, CKMBINDEX, TROPONINI in the last 168 hours. BNP (last 3 results) No results for input(s): PROBNP in the last 8760 hours. HbA1C: No results for input(s): HGBA1C in the last 72 hours. CBG: Recent Labs  Lab 07/08/20 1212 07/08/20 1754 07/08/20 2217 07/09/20 0742 07/09/20 1137  GLUCAP 140* 76 99 101* 140*   Lipid Profile: No results for input(s): CHOL, HDL, LDLCALC, TRIG, CHOLHDL, LDLDIRECT in the last 72 hours. Thyroid Function Tests: No results for input(s): TSH, T4TOTAL, FREET4, T3FREE, THYROIDAB in the last 72 hours. Anemia Panel: No  results for input(s): VITAMINB12, FOLATE, FERRITIN, TIBC, IRON, RETICCTPCT in the last 72 hours. Sepsis Labs: No results for input(s): PROCALCITON, LATICACIDVEN in the last 168 hours.  Recent Results (from the past 240 hour(s))  SARS Coronavirus 2 by RT PCR (hospital order, performed in [facility] hospital lab) Nasopharyngeal Nasopharyngeal Swab     Status: None   Collection Time: 07/02/20  5:39 AM   Specimen: Nasopharyngeal Swab  Result Value Ref Range Status   SARS Coronavirus 2 NEGATIVE NEGATIVE Final    Comment: (NOTE) SARS-CoV-2 target nucleic acids are NOT DETECTED.  The SARS-CoV-2 RNA is generally detectable in upper and lower respiratory specimens during the acute phase of infection. The lowest concentration of SARS-CoV-2 viral copies this assay can detect is 250 copies / mL. A negative result does not preclude SARS-CoV-2 infection and should not be used as the sole basis for treatment or other patient management decisions.  A  negative result may occur with improper specimen collection / handling, submission of specimen other than nasopharyngeal swab, presence of viral mutation(s) within the areas targeted by this assay, and inadequate number of viral copies (<250 copies / mL). A negative result must be combined with clinical observations, patient history, and epidemiological information.  Fact Sheet for Patients:   StrictlyIdeas.no  Fact Sheet for Healthcare Providers: BankingDealers.co.za  This test is not yet approved or  cleared by the [place] FDA and has been authorized for detection and/or diagnosis of SARS-CoV-2 by FDA under an Emergency Use Authorization (EUA).  This EUA will remain in effect (meaning this test can be used) for the duration of the COVID-19 declaration under Section 564(b)(1) of the Act, 21 U.S.C. section 360bbb-3(b)(1), unless the authorization is terminated or revoked sooner.  Performed at  [facility], [place] [address] [place], [place] [address]   Resp Panel by RT-PCR (Flu A&B, Covid) Nasopharyngeal Swab     Status: None   Collection Time: 07/05/20  5:06 PM   Specimen: Nasopharyngeal Swab; Nasopharyngeal(NP) swabs in vial transport medium  Result Value Ref Range Status   SARS Coronavirus 2 by RT PCR NEGATIVE NEGATIVE Final    Comment: (NOTE) SARS-CoV-2 target nucleic acids are NOT DETECTED.  The SARS-CoV-2 RNA is generally detectable in upper respiratory specimens during the acute phase of infection. The lowest concentration of SARS-CoV-2 viral copies this assay can detect is 138 copies/mL. A negative result does not preclude SARS-Cov-2 infection and should not be used as the sole basis for treatment or other patient management decisions. A negative result may occur with  improper specimen collection/handling, submission of specimen other than nasopharyngeal swab, presence of viral mutation(s) within the areas targeted by this assay, and inadequate number of viral copies(<138 copies/mL). A negative result must be combined with clinical observations, patient history, and epidemiological information. The expected result is Negative.  Fact Sheet for Patients:  EntrepreneurPulse.com.au  Fact Sheet for Healthcare Providers:  IncredibleEmployment.be  This test is no t yet approved or cleared by the [place] FDA and  has been authorized for detection and/or diagnosis of SARS-CoV-2 by FDA under an Emergency Use Authorization (EUA). This EUA will remain  in effect (meaning this test can be used) for the duration of the COVID-19 declaration under Section 564(b)(1) of the Act, 21 U.S.C.section 360bbb-3(b)(1), unless the authorization is terminated  or revoked sooner.       Influenza A by PCR NEGATIVE NEGATIVE Final   Influenza B by PCR NEGATIVE NEGATIVE Final    Comment: (NOTE) The Xpert Xpress SARS-CoV-2/FLU/RSV plus assay  is intended as an aid in the diagnosis of influenza from Nasopharyngeal swab specimens and should not be used as a sole basis for treatment. Nasal washings and aspirates are unacceptable for Xpert Xpress SARS-CoV-2/FLU/RSV testing.  Fact Sheet for Patients: EntrepreneurPulse.com.au  Fact Sheet for Healthcare Providers: IncredibleEmployment.be  This test is not yet approved or cleared by the [place] FDA and has been authorized for detection and/or diagnosis of SARS-CoV-2 by FDA under an Emergency Use Authorization (EUA). This EUA will remain in effect (meaning this test can be used) for the duration of the COVID-19 declaration under Section 564(b)(1) of the Act, 21 U.S.C. section 360bbb-3(b)(1), unless the authorization is terminated or revoked.  Performed at [facility], [place] [address], [place], [place] [address]   Culture, blood (routine x 2)     Status: None (Preliminary result)   Collection Time: 07/06/20  1:47 PM   Specimen: BLOOD  Result Value Ref Range Status   Specimen Description   Final    BLOOD RIGHT ANTECUBITAL Performed at [place] [address], [place], [place] [address]    Special Requests   Final    BOTTLES DRAWN AEROBIC ONLY Blood Culture adequate volume Performed at [place] [address], [place], [place] [address]    Culture   Final    NO GROWTH 3 DAYS Performed at [facility], [place] [address], [place], [place] [address]    Report Status PENDING  Incomplete  Culture, blood (routine x 2)     Status: None (Preliminary result)   Collection Time: 07/06/20  1:47 PM   Specimen: BLOOD RIGHT HAND  Result Value Ref Range Status   Specimen Description   Final    BLOOD RIGHT HAND Performed at [place] [address] [place], [place] [address]    Special Requests   Final    BOTTLES DRAWN AEROBIC ONLY Blood Culture  adequate volume Performed at [place] [address] [place], [place] [address]    Culture   Final    NO GROWTH 3 DAYS Performed at [facility], [place] [address] [place], [place] [address]    Report Status PENDING  Incomplete         Radiology Studies: No results found.      Scheduled Meds: . sodium chloride   Intravenous Once  . cholecalciferol  5,000 Units Oral Daily  . diphenhydrAMINE      . famotidine  40 mg Oral Daily  . furosemide  20 mg Oral Daily  . insulin aspart  0-15 Units Subcutaneous TID WC  . insulin glargine  10 Units Subcutaneous Daily  . metoprolol succinate  75 mg Oral Daily  . mometasone-formoterol  2 puff Inhalation BID  . montelukast  10 mg Oral QHS  . olopatadine  1 drop Both Eyes BID  . pantoprazole  40 mg Oral Daily  . pravastatin  40 mg Oral QHS  . pregabalin  100 mg Oral QHS  . [START ON 07/13/2020] romiPLOStim  5 mcg/kg Subcutaneous Weekly  . sertraline  100 mg Oral QHS  . traZODone  100 mg Oral QHS   Continuous Infusions:   LOS: 2 days   Georgette Shell, MD  07/09/2020, 1:31 PM

## 2020-07-10 DIAGNOSIS — D696 Thrombocytopenia, unspecified: Secondary | ICD-10-CM | POA: Diagnosis not present

## 2020-07-10 LAB — CBC
HCT: 29 % — ABNORMAL LOW (ref 36.0–46.0)
Hemoglobin: 8.9 g/dL — ABNORMAL LOW (ref 12.0–15.0)
MCH: 31.1 pg (ref 26.0–34.0)
MCHC: 30.7 g/dL (ref 30.0–36.0)
MCV: 101.4 fL — ABNORMAL HIGH (ref 80.0–100.0)
Platelets: 19 10*3/uL — CL (ref 150–400)
RBC: 2.86 MIL/uL — ABNORMAL LOW (ref 3.87–5.11)
RDW: 23.4 % — ABNORMAL HIGH (ref 11.5–15.5)
WBC: 3.7 10*3/uL — ABNORMAL LOW (ref 4.0–10.5)
nRBC: 5.6 % — ABNORMAL HIGH (ref 0.0–0.2)

## 2020-07-10 LAB — GLUCOSE, CAPILLARY
Glucose-Capillary: 133 mg/dL — ABNORMAL HIGH (ref 70–99)
Glucose-Capillary: 173 mg/dL — ABNORMAL HIGH (ref 70–99)
Glucose-Capillary: 137 mg/dL — ABNORMAL HIGH (ref 70–99)
Glucose-Capillary: 161 mg/dL — ABNORMAL HIGH (ref 70–99)

## 2020-07-10 LAB — TYPE AND SCREEN
ABO/RH(D): AB POS
Antibody Screen: NEGATIVE
Unit division: 0

## 2020-07-10 LAB — BPAM RBC
Blood Product Expiration Date: 202204192359
ISSUE DATE / TIME: 202203251805
Unit Type and Rh: 6200

## 2020-07-10 MED ORDER — NYSTATIN 100000 UNIT/GM EX POWD
Freq: Three times a day (TID) | CUTANEOUS | Status: DC
  Filled 2020-07-10: qty 15

## 2020-07-10 NOTE — Progress Notes (Signed)
PROGRESS NOTE    Kathy Irwin  OAC:[id number] DOB: [birth year] DOA: 07/05/2020 PCP: Kerin Perna, NP    Brief Narrative:Kathy Irwin is a 70 y.o. femalewith medical history significant ofchronic myelomonocytic leukemia, chronic diastolic heart failure, chronic anemia, diabetes mellitus type 2, hyperlipidemia, hypersensitivity pneumonitis, interstitial lung disease, GERD, thrombocytopenia. Patient presented from oncology office for acute on chronic low platelets of unknown etiology   Assessment & Plan:   Principal Problem:   Thrombocytopenia (HCC) Active Problems:   Hypersensitivity pneumonitis (HCC)   Obesity, Class III, BMI 40-49.9 (morbid obesity) ([place])   Essential hypertension   Postoperative wound infection of right hip   Hyperbilirubinemia   Idiopathic thrombocytopenia (HCC)   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HCC)   #1 ITP -Baseline platelets around 50-60,000.  Followed by Dr. Irene Limbo as an outpatient.  Admission platelets were 17,000 down from 44,000 - 3 days prior to admission. Platelets slightly improved to 19 from 2:17 doses of IVIG and 2 doses of Nplate. She received Nplate 07/06/2020  Oncology recommends platelet transfusion less than 10,000 or if she is bleeding. She is status post bone marrow biopsy 07/07/2020.  Noted preliminary report CMML with increased megakaryocytes. Received 2 doses of IVIG and 2 dose of Nplate Received 1 dose of Solu-Medrol. Defer to oncology regarding IVIG/N plate  #2 right hip infection s/p intramedullary nail placement April 30, 2020.  She developed postop infection of the right hip with plans for irrigation and debridement.  This was canceled due to significant thrombocytopenia.  Appreciate ID input.  CT hip with  no significant fluid collection.ID rec to monitor off antibiotics.  #3 ANEMIA -hemoglobin 8.9  #4  Hypersensitivity pneumonitis with chronic hypoxic respiratory failure patient is on 2 L of oxygen at baseline during  the day and at 3 L at night.  #5 type 2 diabetes on Lantus CBG (last 3)  Recent Labs    07/09/20 1641 07/09/20 2213 07/10/20 0755  GLUCAP 151* 312* 133*    #6 chronic myelomonocytic leukemia followed by oncology  #7 chronic diastolic heart failure/hypertension on Toprol and Lasix.  #8 hyperlipidemia on statin  #9 GERD on Protonix  #10 morbid obesity  Estimated body mass index is 41.2 kg/m as calculated from the following:   Height as of this encounter: _0  (1.626 m).   Weight as of this encounter: 108.9 kg.  DVT prophylaxis: SCD due to thrombocytopenia and anemia Code Status: Full code Family Communication: Discussed with patient's daughter. Disposition Plan:  Status is: Inpatient  dispo: The patient is from: Home              Anticipated d/c is to: SNF              Patient currently is not medically stable to d/c.   Difficult to place patient    Consultants:   Oncology and infectious disease  Procedures: Bone marrow biopsy 07/07/2020 Antimicrobials: None  Subjective:  Patient resting in bed complains of a lot of anxiety No nausea vomiting diarrhea bleeding noted Objective: Vitals:   07/09/20 1946 07/09/20 2124 07/10/20 0548 07/10/20 0902  BP:  132/73 137/70   Pulse:  84 66   Resp:  (!) 22 18   Temp:  98.4 F (36.9 C)    TempSrc:  Oral    SpO2: 97% 95% 94% 97%  Weight:      Height:        Intake/Output Summary (Last 24 hours) at 07/10/2020 1030 Last data filed at 07/10/2020 0109 Gross  per 24 hour  Intake 1840 ml  Output 2450 ml  Net -610 ml   Filed Weights   07/05/20 2000  Weight: 108.9 kg    Examination:  General exam: Appears older than stated age Respiratory system: Clear to auscultation. Respiratory effort normal. Cardiovascular system: S1 & S2 heard, RRR. No JVD, murmurs, rubs, gallops or clicks. No pedal edema. Gastrointestinal system: Abdomen is nondistended, soft and nontender. No organomegaly or masses felt. Normal bowel sounds  heard. Central nervous system: Alert and oriented. No focal neurological deficits. Extremities: 1+ edema Skin: No rashes, lesions or ulcers Psychiatry: Judgement and insight appear normal. Mood & affect appropriate.     Data Reviewed: I have personally reviewed following labs and imaging studies  CBC: Recent Labs  Lab 07/05/20 1442 07/06/20 0632 07/07/20 0540 07/08/20 1044 07/09/20 0554 07/10/20 0626  WBC 4.9 4.2 4.0 3.4* 4.2 3.7*  NEUTROABS 1.3*  --  1.5* 1.0*  --   --   HGB 7.9* 8.3* 8.5* 8.1* 7.6* 8.9*  HCT 26.8* 27.1* 28.5* 27.4* 25.9* 29.0*  MCV 102.7* 101.5* 105.2* 103.0* 102.4* 101.4*  PLT 17* 16* 18* 20* 17* 19*   Basic Metabolic Panel: Recent Labs  Lab 07/05/20 1442 07/06/20 0632  NA 139 138  K 4.2 3.8  CL 106 105  CO2 25 27  GLUCOSE 145* 113*  BUN 9 8  CREATININE 0.68 0.54  CALCIUM 9.6 9.2   GFR: Estimated Creatinine Clearance: 78.9 mL/min (by C-G formula based on SCr of 0.54 mg/dL). Liver Function Tests: Recent Labs  Lab 07/05/20 1442 07/05/20 2032 07/06/20 0632  AST 13*  --  13*  ALT 10  --  10  ALKPHOS 71  --  60  BILITOT 2.3* 2.1* 2.5*  PROT 6.6  --  5.8*  ALBUMIN 3.5  --  3.3*   No results for input(s): LIPASE, AMYLASE in the last 168 hours. No results for input(s): AMMONIA in the last 168 hours. Coagulation Profile: No results for input(s): INR, PROTIME in the last 168 hours. Cardiac Enzymes: No results for input(s): CKTOTAL, CKMB, CKMBINDEX, TROPONINI in the last 168 hours. BNP (last 3 results) No results for input(s): PROBNP in the last 8760 hours. HbA1C: No results for input(s): HGBA1C in the last 72 hours. CBG: Recent Labs  Lab 07/09/20 0742 07/09/20 1137 07/09/20 1641 07/09/20 2213 07/10/20 0755  GLUCAP 101* 140* 151* 312* 133*   Lipid Profile: No results for input(s): CHOL, HDL, LDLCALC, TRIG, CHOLHDL, LDLDIRECT in the last 72 hours. Thyroid Function Tests: No results for input(s): TSH, T4TOTAL, FREET4, T3FREE,  THYROIDAB in the last 72 hours. Anemia Panel: No results for input(s): VITAMINB12, FOLATE, FERRITIN, TIBC, IRON, RETICCTPCT in the last 72 hours. Sepsis Labs: No results for input(s): PROCALCITON, LATICACIDVEN in the last 168 hours.  Recent Results (from the past 240 hour(s))  SARS Coronavirus 2 by RT PCR (hospital order, performed in [facility] hospital lab) Nasopharyngeal Nasopharyngeal Swab     Status: None   Collection Time: 07/02/20  5:39 AM   Specimen: Nasopharyngeal Swab  Result Value Ref Range Status   SARS Coronavirus 2 NEGATIVE NEGATIVE Final    Comment: (NOTE) SARS-CoV-2 target nucleic acids are NOT DETECTED.  The SARS-CoV-2 RNA is generally detectable in upper and lower respiratory specimens during the acute phase of infection. The lowest concentration of SARS-CoV-2 viral copies this assay can detect is 250 copies / mL. A negative result does not preclude SARS-CoV-2 infection and should not be used as the sole  basis for treatment or other patient management decisions.  A negative result may occur with improper specimen collection / handling, submission of specimen other than nasopharyngeal swab, presence of viral mutation(s) within the areas targeted by this assay, and inadequate number of viral copies (<250 copies / mL). A negative result must be combined with clinical observations, patient history, and epidemiological information.  Fact Sheet for Patients:   StrictlyIdeas.no  Fact Sheet for Healthcare Providers: BankingDealers.co.za  This test is not yet approved or  cleared by the [place] FDA and has been authorized for detection and/or diagnosis of SARS-CoV-2 by FDA under an Emergency Use Authorization (EUA).  This EUA will remain in effect (meaning this test can be used) for the duration of the COVID-19 declaration under Section 564(b)(1) of the Act, 21 U.S.C. section 360bbb-3(b)(1), unless the authorization  is terminated or revoked sooner.  Performed at [facility], [place] [address] [place], [place] [address]   Resp Panel by RT-PCR (Flu A&B, Covid) Nasopharyngeal Swab     Status: None   Collection Time: 07/05/20  5:06 PM   Specimen: Nasopharyngeal Swab; Nasopharyngeal(NP) swabs in vial transport medium  Result Value Ref Range Status   SARS Coronavirus 2 by RT PCR NEGATIVE NEGATIVE Final    Comment: (NOTE) SARS-CoV-2 target nucleic acids are NOT DETECTED.  The SARS-CoV-2 RNA is generally detectable in upper respiratory specimens during the acute phase of infection. The lowest concentration of SARS-CoV-2 viral copies this assay can detect is 138 copies/mL. A negative result does not preclude SARS-Cov-2 infection and should not be used as the sole basis for treatment or other patient management decisions. A negative result may occur with  improper specimen collection/handling, submission of specimen other than nasopharyngeal swab, presence of viral mutation(s) within the areas targeted by this assay, and inadequate number of viral copies(<138 copies/mL). A negative result must be combined with clinical observations, patient history, and epidemiological information. The expected result is Negative.  Fact Sheet for Patients:  EntrepreneurPulse.com.au  Fact Sheet for Healthcare Providers:  IncredibleEmployment.be  This test is no t yet approved or cleared by the [place] FDA and  has been authorized for detection and/or diagnosis of SARS-CoV-2 by FDA under an Emergency Use Authorization (EUA). This EUA will remain  in effect (meaning this test can be used) for the duration of the COVID-19 declaration under Section 564(b)(1) of the Act, 21 U.S.C.section 360bbb-3(b)(1), unless the authorization is terminated  or revoked sooner.       Influenza A by PCR NEGATIVE NEGATIVE Final   Influenza B by PCR NEGATIVE NEGATIVE Final    Comment:  (NOTE) The Xpert Xpress SARS-CoV-2/FLU/RSV plus assay is intended as an aid in the diagnosis of influenza from Nasopharyngeal swab specimens and should not be used as a sole basis for treatment. Nasal washings and aspirates are unacceptable for Xpert Xpress SARS-CoV-2/FLU/RSV testing.  Fact Sheet for Patients: EntrepreneurPulse.com.au  Fact Sheet for Healthcare Providers: IncredibleEmployment.be  This test is not yet approved or cleared by the [place] FDA and has been authorized for detection and/or diagnosis of SARS-CoV-2 by FDA under an Emergency Use Authorization (EUA). This EUA will remain in effect (meaning this test can be used) for the duration of the COVID-19 declaration under Section 564(b)(1) of the Act, 21 U.S.C. section 360bbb-3(b)(1), unless the authorization is terminated or revoked.  Performed at [facility], [place] [address] [place], [place] [address]   Culture, blood (routine x 2)  Status: None (Preliminary result)   Collection Time: 07/06/20  1:47 PM   Specimen: BLOOD  Result Value Ref Range Status   Specimen Description   Final    BLOOD RIGHT ANTECUBITAL Performed at [facility] [address] [place], [place] [address]    Special Requests   Final    BOTTLES DRAWN AEROBIC ONLY Blood Culture adequate volume Performed at [place] [address] [place], [place] [address]    Culture   Final    NO GROWTH 3 DAYS Performed at [facility], [place] [address], [place], [place] [address]    Report Status PENDING  Incomplete  Culture, blood (routine x 2)     Status: None (Preliminary result)   Collection Time: 07/06/20  1:47 PM   Specimen: BLOOD RIGHT HAND  Result Value Ref Range Status   Specimen Description   Final    BLOOD RIGHT HAND Performed at [facility] [address], [place], [place] [address]    Special Requests    Final    BOTTLES DRAWN AEROBIC ONLY Blood Culture adequate volume Performed at [place] [address], [place], [place] [address]    Culture   Final    NO GROWTH 3 DAYS Performed at [facility], [place] [address], [place], [place] [address]    Report Status PENDING  Incomplete         Radiology Studies: No results found.      Scheduled Meds: . cholecalciferol  5,000 Units Oral Daily  . famotidine  40 mg Oral Daily  . furosemide  20 mg Oral Daily  . insulin aspart  0-15 Units Subcutaneous TID WC  . insulin glargine  10 Units Subcutaneous Daily  . metoprolol succinate  75 mg Oral Daily  . mometasone-formoterol  2 puff Inhalation BID  . montelukast  10 mg Oral QHS  . olopatadine  1 drop Both Eyes BID  . pantoprazole  40 mg Oral Daily  . pravastatin  40 mg Oral QHS  . pregabalin  100 mg Oral QHS  . [START ON 07/13/2020] romiPLOStim  5 mcg/kg Subcutaneous Weekly  . sertraline  100 mg Oral QHS  . traZODone  100 mg Oral QHS   Continuous Infusions:   LOS: 3 days   Georgette Shell, MD  07/10/2020, 10:30 AM

## 2020-07-10 NOTE — Progress Notes (Signed)
Physical Therapy Treatment Patient Details Name: Kathy Irwin MRN: [id number] DOB: 1951/03/02 Today's Date: 07/10/2020    History of Present Illness 70 y.o. female with medical history significant of chronic myelomonocytic leukemia, chronic diastolic heart failure, chronic anemia, diabetes mellitus type 2, hyperlipidemia, hypersensitivity pneumonitis, interstitial lung disease, GERD, thrombocytopenia. Recent hip fracture in january 2022.I & D for R hip infection postponed 2* thrombocytopenia.    PT Comments    Pt tolerated increased ambulation distance of 80' with RW, SaO2 86% on 4L O2 with walking, then up to 90% on 3L O2 after several minutes of rest in supine. Pt performed RLE ROM/strengthening exercises.   Follow Up Recommendations  Home health PT     Equipment Recommendations  None recommended by PT    Recommendations for Other Services       Precautions / Restrictions Precautions Precautions: Fall Restrictions Weight Bearing Restrictions: No    Mobility  Bed Mobility Overal bed mobility: Modified Independent             General bed mobility comments: used rail, increased time    Transfers Overall transfer level: Needs assistance Equipment used: Rolling walker (2 wheeled) Transfers: Sit to/from Stand Sit to Stand: Supervision         General transfer comment: good hand placement  Ambulation/Gait Ambulation/Gait assistance: Min guard Gait Distance (Feet): 80 Feet Assistive device: Rolling walker (2 wheeled) Gait Pattern/deviations: Step-to pattern;Decreased weight shift to right;Decreased step length - right;Decreased step length - left Gait velocity: WFL   General Gait Details: distance limited by  fatigue, 3/4 dyspnea with walking, SaO2 86% on 4L O2 [place]. SaO2 up to 90% on 3L O2 after 4 minutes rest in supine.   Stairs             Wheelchair Mobility    Modified Rankin (Stroke Patients Only)       Balance Overall balance assessment:  Needs assistance Sitting-balance support: Feet supported Sitting balance-Leahy Scale: Good     Standing balance support: Bilateral upper extremity supported Standing balance-Leahy Scale: Poor                              Cognition Arousal/Alertness: Awake/alert Behavior During Therapy: WFL for tasks assessed/performed Overall Cognitive Status: Within Functional Limits for tasks assessed                                        Exercises General Exercises - Lower Extremity Ankle Circles/Pumps: AROM;Both;10 reps;Supine Heel Slides: AAROM;Right;10 reps;Supine Hip ABduction/ADduction: AROM;Right;10 reps;Supine    General Comments        Pertinent Vitals/Pain Pain Score: 3  Pain Location: R hip Pain Descriptors / Indicators: Grimacing;Guarding Pain Intervention(s): Monitored during session;Limited activity within patient's tolerance    Home Living                      Prior Function            PT Goals (current goals can now be found in the care plan section) Acute Rehab PT Goals Patient Stated Goal: DC home PT Goal Formulation: With patient Time For Goal Achievement: 07/20/20 Potential to Achieve Goals: Fair Progress towards PT goals: Progressing toward goals    Frequency    Min 3X/week      PT Plan Current plan remains appropriate    Co-evaluation  AM-PAC PT "6 Clicks" Mobility   Outcome Measure  Help needed turning from your back to your side while in a flat bed without using bedrails?: A Little Help needed moving from lying on your back to sitting on the side of a flat bed without using bedrails?: A Little Help needed moving to and from a bed to a chair (including a wheelchair)?: A Little Help needed standing up from a chair using your arms (e.g., wheelchair or bedside chair)?: A Little Help needed to walk in hospital room?: A Little Help needed climbing 3-5 steps with a railing? : A Little 6 Click  Score: 18    End of Session Equipment Utilized During Treatment: Gait belt Activity Tolerance: Patient limited by fatigue Patient left: with call bell/phone within reach;in bed;with bed alarm set Nurse Communication: Mobility status PT Visit Diagnosis: Difficulty in walking, not elsewhere classified (R26.2);Pain Pain - Right/Left: Right Pain - part of body: Hip     Time: 3532-9924 PT Time Calculation (min) (ACUTE ONLY): 22 min  Charges:  $Gait Training: 8-22 mins                     Blondell Reveal Kistler PT 07/10/2020  Acute Rehabilitation Services Pager [phone] Office [phone]

## 2020-07-11 DIAGNOSIS — D696 Thrombocytopenia, unspecified: Secondary | ICD-10-CM | POA: Diagnosis not present

## 2020-07-11 LAB — CBC
HCT: 28.9 % — ABNORMAL LOW (ref 36.0–46.0)
Hemoglobin: 8.8 g/dL — ABNORMAL LOW (ref 12.0–15.0)
MCH: 30.7 pg (ref 26.0–34.0)
MCHC: 30.4 g/dL (ref 30.0–36.0)
MCV: 100.7 fL — ABNORMAL HIGH (ref 80.0–100.0)
Platelets: 19 10*3/uL — CL (ref 150–400)
RBC: 2.87 MIL/uL — ABNORMAL LOW (ref 3.87–5.11)
RDW: 22.9 % — ABNORMAL HIGH (ref 11.5–15.5)
WBC: 3.8 10*3/uL — ABNORMAL LOW (ref 4.0–10.5)
nRBC: 8 % — ABNORMAL HIGH (ref 0.0–0.2)

## 2020-07-11 LAB — CULTURE, BLOOD (ROUTINE X 2)
Culture: NO GROWTH
Culture: NO GROWTH
Special Requests: ADEQUATE
Special Requests: ADEQUATE

## 2020-07-11 LAB — GLUCOSE, CAPILLARY
Glucose-Capillary: 115 mg/dL — ABNORMAL HIGH (ref 70–99)
Glucose-Capillary: 110 mg/dL — ABNORMAL HIGH (ref 70–99)
Glucose-Capillary: 146 mg/dL — ABNORMAL HIGH (ref 70–99)
Glucose-Capillary: 107 mg/dL — ABNORMAL HIGH (ref 70–99)

## 2020-07-11 MED ORDER — LINACLOTIDE 145 MCG PO CAPS: 145.0000 ug | ORAL_CAPSULE | Freq: Every day | ORAL | Status: DC

## 2020-07-11 MED ORDER — LINACLOTIDE 145 MCG PO CAPS
145.0000 ug | ORAL_CAPSULE | Freq: Every day | ORAL | Status: DC
  Administered 2020-07-11 – 2020-07-18 (×7): 145 ug via ORAL
  Filled 2020-07-11 (×11): qty 1

## 2020-07-11 MED ORDER — ALUM & MAG HYDROXIDE-SIMETH 200-200-20 MG/5ML PO SUSP
15.0000 mL | Freq: Four times a day (QID) | ORAL | Status: DC | PRN
  Administered 2020-07-11 – 2020-07-13 (×3): 15 mL via ORAL
  Filled 2020-07-11 (×4): qty 30

## 2020-07-11 NOTE — Progress Notes (Signed)
PROGRESS NOTE    Kathy Irwin  NET:[id number] DOB: 15-Oct-1950 DOA: 07/05/2020 PCP: Kerin Perna, NP    Brief Narrative:Kathy Irwin is a 70 y.o. femalewith medical history significant ofchronic myelomonocytic leukemia, chronic diastolic heart failure, chronic anemia, diabetes mellitus type 2, hyperlipidemia, hypersensitivity pneumonitis, interstitial lung disease, GERD, thrombocytopenia. Patient presented from oncology office for acute on chronic low platelets of unknown etiology   Assessment & Plan:   Principal Problem:   Thrombocytopenia (HCC) Active Problems:   Hypersensitivity pneumonitis (HCC)   Obesity, Class III, BMI 40-49.9 (morbid obesity) ([place])   Essential hypertension   Postoperative wound infection of right hip   Hyperbilirubinemia   Idiopathic thrombocytopenia (HCC)   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HCC)   #1 ITP -Baseline platelets around 50-60,000.  Followed by Dr. Irene Limbo as an outpatient.  Admission platelets were 17,000 down from 44,000 - 3 days prior to admission. Platelets slightly improved to 19 from 2:17 doses of IVIG and 2 doses of Nplate. Oncology recommends platelet transfusion less than 10,000 or if she is bleeding. Platelet count remains about the same as yesterday. She is status post bone marrow biopsy 07/07/2020.  Noted preliminary report CMML with increased megakaryocytes. Received 2 doses of IVIG and 2 dose of Nplate Received 1 dose of Solu-Medrol. Defer to oncology regarding IVIG/N plate  #2 right hip infection s/p intramedullary nail placement April 30, 2020.  She developed postop infection of the right hip with plans for irrigation and debridement.  This was canceled due to significant thrombocytopenia.  Appreciate ID input.  CT hip with  no significant fluid collection.ID rec to monitor off antibiotics.  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  #5 type 2 diabetes on Lantus CBG (last 3)  Recent Labs    07/10/20 1742 07/10/20 2151 07/11/20 0749  GLUCAP 137* 161* 115*    #6 chronic myelomonocytic leukemia followed by oncology  #7 chronic diastolic heart failure/hypertension on Toprol and Lasix.  #8 hyperlipidemia on statin  #9 GERD on Protonix  #10 morbid obesity  Estimated body mass index is 41.2 kg/m as calculated from the following:   Height as of this encounter: 5' 4" (1.626 m).   Weight as of this encounter: 108.9 kg.  DVT prophylaxis: SCD due to thrombocytopenia and anemia Code Status: Full code Family Communication: Discussed with patient's daughter. Disposition Plan:  Status is: Inpatient  dispo: The patient is from: Home              Anticipated d/c is to: SNF              Patient currently is not medically stable to d/c.   Difficult to place patient    Consultants:   Oncology and infectious disease  Procedures: Bone marrow biopsy 07/07/2020 Antimicrobials: None  Subjective: Is resting in bed she denies any new complaints today.  She told me not to communicate with her children about her health care.  I should just speak with the patient and patient can rely the information over to her family.  She was in agreement to that.  No bleeding Platelet count remains low but stable at 19.  Objective: Vitals:   07/10/20 1545 07/10/20 2119 07/11/20 0508 07/11/20 0804  BP: 129/69 (!) 125/59 (!) 138/50   Pulse: 72 66 89   Resp: _0 Temp: Marland Kitchen)  97.5 F (36.4 C) 98.1 F (36.7 C) 99.1 F (37.3 C)   TempSrc: Oral Oral Oral   SpO2: 100% 100% 100% 96%  Weight:      Height:        Intake/Output Summary (Last 24 hours) at 07/11/2020 1034 Last data filed at 07/11/2020 7628 Gross per 24 hour  Intake 240 ml  Output 1300 ml  Net -1060 ml   Filed Weights   07/05/20 2000  Weight: 108.9 kg    Examination:  General exam: Appears older than  stated age Respiratory system: Clear to auscultation. Respiratory effort normal. Cardiovascular system: S1 & S2 heard, RRR. No JVD, murmurs, rubs, gallops or clicks. No pedal edema. Gastrointestinal system: Abdomen is nondistended, soft and nontender. No organomegaly or masses felt. Normal bowel sounds heard. Central nervous system: Alert and oriented. No focal neurological deficits. Extremities: 1+ edema Skin: No rashes, lesions or ulcers Psychiatry: Judgement and insight appear normal. Mood & affect appropriate.     Data Reviewed: I have personally reviewed following labs and imaging studies  CBC: Recent Labs  Lab 07/05/20 1442 07/06/20 0632 07/07/20 0540 07/08/20 1044 07/09/20 0554 07/10/20 0626 07/11/20 0530  WBC 4.9   < > 4.0 3.4* 4.2 3.7* 3.8*  NEUTROABS 1.3*  --  1.5* 1.0*  --   --   --   HGB 7.9*   < > 8.5* 8.1* 7.6* 8.9* 8.8*  HCT 26.8*   < > 28.5* 27.4* 25.9* 29.0* 28.9*  MCV 102.7*   < > 105.2* 103.0* 102.4* 101.4* 100.7*  PLT 17*   < > 18* 20* 17* 19* 19*   < > = values in this interval not displayed.   Basic Metabolic Panel: Recent Labs  Lab 07/05/20 1442 07/06/20 0632  NA 139 138  K 4.2 3.8  CL 106 105  CO2 25 27  GLUCOSE 145* 113*  BUN 9 8  CREATININE 0.68 0.54  CALCIUM 9.6 9.2   GFR: Estimated Creatinine Clearance: 78.9 mL/min (by C-G formula based on SCr of 0.54 mg/dL). Liver Function Tests: Recent Labs  Lab 07/05/20 1442 07/05/20 2032 07/06/20 0632  AST 13*  --  13*  ALT 10  --  10  ALKPHOS 71  --  60  BILITOT 2.3* 2.1* 2.5*  PROT 6.6  --  5.8*  ALBUMIN 3.5  --  3.3*   No results for input(s): LIPASE, AMYLASE in the last 168 hours. No results for input(s): AMMONIA in the last 168 hours. Coagulation Profile: No results for input(s): INR, PROTIME in the last 168 hours. Cardiac Enzymes: No results for input(s): CKTOTAL, CKMB, CKMBINDEX, TROPONINI in the last 168 hours. BNP (last 3 results) No results for input(s): PROBNP in the last  8760 hours. HbA1C: No results for input(s): HGBA1C in the last 72 hours. CBG: Recent Labs  Lab 07/10/20 0755 07/10/20 1222 07/10/20 1742 07/10/20 2151 07/11/20 0749  GLUCAP 133* 173* 137* 161* 115*   Lipid Profile: No results for input(s): CHOL, HDL, LDLCALC, TRIG, CHOLHDL, LDLDIRECT in the last 72 hours. Thyroid Function Tests: No results for input(s): TSH, T4TOTAL, FREET4, T3FREE, THYROIDAB in the last 72 hours. Anemia Panel: No results for input(s): VITAMINB12, FOLATE, FERRITIN, TIBC, IRON, RETICCTPCT in the last 72 hours. Sepsis Labs: No results for input(s): PROCALCITON, LATICACIDVEN in the last 168 hours.  Recent Results (from the past 240 hour(s))  SARS Coronavirus 2 by RT PCR (hospital order, performed in [facility] hospital lab) Nasopharyngeal Nasopharyngeal Swab     Status:  None   Collection Time: 07/02/20  5:39 AM   Specimen: Nasopharyngeal Swab  Result Value Ref Range Status   SARS Coronavirus 2 NEGATIVE NEGATIVE Final    Comment: (NOTE) SARS-CoV-2 target nucleic acids are NOT DETECTED.  The SARS-CoV-2 RNA is generally detectable in upper and lower respiratory specimens during the acute phase of infection. The lowest concentration of SARS-CoV-2 viral copies this assay can detect is 250 copies / mL. A negative result does not preclude SARS-CoV-2 infection and should not be used as the sole basis for treatment or other patient management decisions.  A negative result may occur with improper specimen collection / handling, submission of specimen other than nasopharyngeal swab, presence of viral mutation(s) within the areas targeted by this assay, and inadequate number of viral copies (<250 copies / mL). A negative result must be combined with clinical observations, patient history, and epidemiological information.  Fact Sheet for Patients:   StrictlyIdeas.no  Fact Sheet for Healthcare  Providers: BankingDealers.co.za  This test is not yet approved or  cleared by the [place] FDA and has been authorized for detection and/or diagnosis of SARS-CoV-2 by FDA under an Emergency Use Authorization (EUA).  This EUA will remain in effect (meaning this test can be used) for the duration of the COVID-19 declaration under Section 564(b)(1) of the Act, 21 U.S.C. section 360bbb-3(b)(1), unless the authorization is terminated or revoked sooner.  Performed at [facility], [place] [address], [place], [place] [address]   Resp Panel by RT-PCR (Flu A&B, Covid) Nasopharyngeal Swab     Status: None   Collection Time: 07/05/20  5:06 PM   Specimen: Nasopharyngeal Swab; Nasopharyngeal(NP) swabs in vial transport medium  Result Value Ref Range Status   SARS Coronavirus 2 by RT PCR NEGATIVE NEGATIVE Final    Comment: (NOTE) SARS-CoV-2 target nucleic acids are NOT DETECTED.  The SARS-CoV-2 RNA is generally detectable in upper respiratory specimens during the acute phase of infection. The lowest concentration of SARS-CoV-2 viral copies this assay can detect is 138 copies/mL. A negative result does not preclude SARS-Cov-2 infection and should not be used as the sole basis for treatment or other patient management decisions. A negative result may occur with  improper specimen collection/handling, submission of specimen other than nasopharyngeal swab, presence of viral mutation(s) within the areas targeted by this assay, and inadequate number of viral copies(<138 copies/mL). A negative result must be combined with clinical observations, patient history, and epidemiological information. The expected result is Negative.  Fact Sheet for Patients:  EntrepreneurPulse.com.au  Fact Sheet for Healthcare Providers:  IncredibleEmployment.be  This test is no t yet approved or cleared by the [place] FDA and  has been authorized  for detection and/or diagnosis of SARS-CoV-2 by FDA under an Emergency Use Authorization (EUA). This EUA will remain  in effect (meaning this test can be used) for the duration of the COVID-19 declaration under Section 564(b)(1) of the Act, 21 U.S.C.section 360bbb-3(b)(1), unless the authorization is terminated  or revoked sooner.       Influenza A by PCR NEGATIVE NEGATIVE Final   Influenza B by PCR NEGATIVE NEGATIVE Final    Comment: (NOTE) The Xpert Xpress SARS-CoV-2/FLU/RSV plus assay is intended as an aid in the diagnosis of influenza from Nasopharyngeal swab specimens and should not be used as a sole basis for treatment. Nasal washings and aspirates are unacceptable for Xpert Xpress SARS-CoV-2/FLU/RSV testing.  Fact Sheet for Patients: EntrepreneurPulse.com.au  Fact Sheet for Healthcare Providers: IncredibleEmployment.be  This  test is not yet approved or cleared by the [place] and has been authorized for detection and/or diagnosis of SARS-CoV-2 by FDA under an Emergency Use Authorization (EUA). This EUA will remain in effect (meaning this test can be used) for the duration of the COVID-19 declaration under Section 564(b)(1) of the Act, 21 U.S.C. section 360bbb-3(b)(1), unless the authorization is terminated or revoked.  Performed at [facility], [place] [address], [place], [place] [address]   Culture, blood (routine x 2)     Status: None (Preliminary result)   Collection Time: 07/06/20  1:47 PM   Specimen: BLOOD  Result Value Ref Range Status   Specimen Description   Final    BLOOD RIGHT ANTECUBITAL Performed at [facility] [address] [place], [place] [address]    Special Requests   Final    BOTTLES DRAWN AEROBIC ONLY Blood Culture adequate volume Performed at [facility] [address] [place], [place] [address]    Culture   Final    NO GROWTH 4  DAYS Performed at [facility], [place] [address], [place], [place] [address]    Report Status PENDING  Incomplete  Culture, blood (routine x 2)     Status: None (Preliminary result)   Collection Time: 07/06/20  1:47 PM   Specimen: BLOOD RIGHT HAND  Result Value Ref Range Status   Specimen Description   Final    BLOOD RIGHT HAND Performed at [facility] [address], [place], [place] [address]    Special Requests   Final    BOTTLES DRAWN AEROBIC ONLY Blood Culture adequate volume Performed at [facility] [address], [place], [place] [address]    Culture   Final    NO GROWTH 4 DAYS Performed at [facility], [place] [address] [place], [place] [address]    Report Status PENDING  Incomplete         Radiology Studies: No results found.      Scheduled Meds: . cholecalciferol  5,000 Units Oral Daily  . famotidine  40 mg Oral Daily  . furosemide  20 mg Oral Daily  . insulin aspart  0-15 Units Subcutaneous TID WC  . insulin glargine  10 Units Subcutaneous Daily  . metoprolol succinate  75 mg Oral Daily  . mometasone-formoterol  2 puff Inhalation BID  . montelukast  10 mg Oral QHS  . nystatin   Topical TID  . olopatadine  1 drop Both Eyes BID  . pantoprazole  40 mg Oral Daily  . pravastatin  40 mg Oral QHS  . pregabalin  100 mg Oral QHS  . [START ON 07/13/2020] romiPLOStim  5 mcg/kg Subcutaneous Weekly  . sertraline  100 mg Oral QHS  . traZODone  100 mg Oral QHS   Continuous Infusions:   LOS: 4 days   Georgette Shell, MD  07/11/2020, 10:34 AM

## 2020-07-12 DIAGNOSIS — D696 Thrombocytopenia, unspecified: Secondary | ICD-10-CM | POA: Diagnosis not present

## 2020-07-12 DIAGNOSIS — Z7189 Other specified counseling: Secondary | ICD-10-CM | POA: Diagnosis not present

## 2020-07-12 DIAGNOSIS — C9310 Chronic myelomonocytic leukemia not having achieved remission: Secondary | ICD-10-CM | POA: Diagnosis not present

## 2020-07-12 LAB — CBC
HCT: 26.4 % — ABNORMAL LOW (ref 36.0–46.0)
Hemoglobin: 8.2 g/dL — ABNORMAL LOW (ref 12.0–15.0)
MCH: 30.7 pg (ref 26.0–34.0)
MCHC: 31.1 g/dL (ref 30.0–36.0)
MCV: 98.9 fL (ref 80.0–100.0)
Platelets: 20 10*3/uL — CL (ref 150–400)
RBC: 2.67 MIL/uL — ABNORMAL LOW (ref 3.87–5.11)
RDW: 22.2 % — ABNORMAL HIGH (ref 11.5–15.5)
WBC: 4.7 10*3/uL (ref 4.0–10.5)
nRBC: 6.4 % — ABNORMAL HIGH (ref 0.0–0.2)

## 2020-07-12 LAB — GLUCOSE, CAPILLARY
Glucose-Capillary: 82 mg/dL (ref 70–99)
Glucose-Capillary: 147 mg/dL — ABNORMAL HIGH (ref 70–99)
Glucose-Capillary: 115 mg/dL — ABNORMAL HIGH (ref 70–99)
Glucose-Capillary: 142 mg/dL — ABNORMAL HIGH (ref 70–99)

## 2020-07-12 NOTE — Progress Notes (Signed)
PROGRESS NOTE    Kathy Irwin  OFB:[id number] DOB: April 10, 1951 DOA: 07/05/2020 PCP: Kerin Perna, NP    Brief Narrative:Kathy Irwin is a 70 y.o. femalewith medical history significant ofchronic myelomonocytic leukemia, chronic diastolic heart failure, chronic anemia, diabetes mellitus type 2, hyperlipidemia, hypersensitivity pneumonitis, interstitial lung disease, GERD, thrombocytopenia. Patient presented from oncology office for acute on chronic low platelets of unknown etiology   Assessment & Plan:   Principal Problem:   Thrombocytopenia (HCC) Active Problems:   Hypersensitivity pneumonitis (HCC)   Obesity, Class III, BMI 40-49.9 (morbid obesity) ([place])   Essential hypertension   Postoperative wound infection of right hip   Hyperbilirubinemia   Idiopathic thrombocytopenia (HCC)   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HCC)   #1 ITP -Baseline platelets around 50-60,000.  Followed by Dr. Irene Limbo as an outpatient.  Admission platelets were 17,000 down from 44,000 - 3 days prior to admission. Platelets slightly improved to 20 from 19 from 17, Oncology recommends platelet transfusion less than 10,000 or if she is bleeding. Platelet count remains about the same as yesterday. She is status post bone marrow biopsy 07/07/2020.  Noted preliminary report CMML with increased megakaryocytes. Received 2 doses of IVIG and 2 dose of Nplate Received 1 dose of Solu-Medrol. Defer to oncology regarding IVIG/N plate  #2 right hip infection s/p intramedullary nail placement April 30, 2020.  She developed postop infection of the right hip with plans for irrigation and debridement.  This was canceled due to significant thrombocytopenia.  Appreciate ID input.  CT hip with  no significant fluid collection.ID rec to monitor off antibiotics.  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  #5 type 2 diabetes on Lantus CBG (last 3)  Recent Labs    07/11/20 1753 07/11/20 2118 07/12/20 0730  GLUCAP 146* 107* 82    #6 chronic myelomonocytic leukemia followed by oncology  #7 chronic diastolic heart failure/hypertension on Toprol and Lasix.  #8 hyperlipidemia on statin  #9 GERD on Protonix  #10 morbid obesity  Estimated body mass index is 41.2 kg/m as calculated from the following:   Height as of this encounter: _0  (1.626 m).   Weight as of this encounter: 108.9 kg.  DVT prophylaxis: SCD due to thrombocytopenia and anemia Code Status: Full code Family Communication: Discussed with patient's daughter. Disposition Plan:  Status is: Inpatient  dispo: The patient is from: Home              Anticipated d/c is to: SNF              Patient currently is not medically stable to d/c.   Difficult to place patient    Consultants:   Oncology and infectious disease  Procedures: Bone marrow biopsy 07/07/2020 Antimicrobials: None  Subjective: She is resting in bed in no acute distress did some walking with physical therapy. No nausea vomiting diarrhea.  Objective: Vitals:   07/11/20 1459 07/11/20 2003 07/12/20 0551 07/12/20 0835  BP: (!) 148/70 120/68 (!) 135/58   Pulse: 90 82 96   Resp:  16 16   Temp: 98.4 F (36.9 C) 98.1 F (36.7 C) 98.1 F (36.7 C)   TempSrc: Oral Oral Oral   SpO2: 99% 100% 100% 93%  Weight:      Height:        Intake/Output Summary (Last 24 hours) at 07/12/2020 1036  Last data filed at 07/12/2020 0915 Gross per 24 hour  Intake 120 ml  Output 1650 ml  Net -1530 ml   Filed Weights   07/05/20 2000  Weight: 108.9 kg    Examination:  General exam: Appears older than stated age Respiratory system: Clear to auscultation. Respiratory effort normal. Cardiovascular system: S1 & S2 heard, RRR. No JVD, murmurs, rubs, gallops or clicks. No pedal edema. Gastrointestinal system: Abdomen is nondistended, soft and  nontender. No organomegaly or masses felt. Normal bowel sounds heard. Central nervous system: Alert and oriented. No focal neurological deficits. Extremities: 1+ edema Skin: No rashes, lesions or ulcers Psychiatry: Judgement and insight appear normal. Mood & affect appropriate.     Data Reviewed: I have personally reviewed following labs and imaging studies  CBC: Recent Labs  Lab 07/05/20 1442 07/06/20 0632 07/07/20 0540 07/08/20 1044 07/09/20 0554 07/10/20 0626 07/11/20 0530 07/12/20 0448  WBC 4.9   < > 4.0 3.4* 4.2 3.7* 3.8* 4.7  NEUTROABS 1.3*  --  1.5* 1.0*  --   --   --   --   HGB 7.9*   < > 8.5* 8.1* 7.6* 8.9* 8.8* 8.2*  HCT 26.8*   < > 28.5* 27.4* 25.9* 29.0* 28.9* 26.4*  MCV 102.7*   < > 105.2* 103.0* 102.4* 101.4* 100.7* 98.9  PLT 17*   < > 18* 20* 17* 19* 19* 20*   < > = values in this interval not displayed.   Basic Metabolic Panel: Recent Labs  Lab 07/05/20 1442 07/06/20 0632  NA 139 138  K 4.2 3.8  CL 106 105  CO2 25 27  GLUCOSE 145* 113*  BUN 9 8  CREATININE 0.68 0.54  CALCIUM 9.6 9.2   GFR: Estimated Creatinine Clearance: 78.9 mL/min (by C-G formula based on SCr of 0.54 mg/dL). Liver Function Tests: Recent Labs  Lab 07/05/20 1442 07/05/20 2032 07/06/20 0632  AST 13*  --  13*  ALT 10  --  10  ALKPHOS 71  --  60  BILITOT 2.3* 2.1* 2.5*  PROT 6.6  --  5.8*  ALBUMIN 3.5  --  3.3*   No results for input(s): LIPASE, AMYLASE in the last 168 hours. No results for input(s): AMMONIA in the last 168 hours. Coagulation Profile: No results for input(s): INR, PROTIME in the last 168 hours. Cardiac Enzymes: No results for input(s): CKTOTAL, CKMB, CKMBINDEX, TROPONINI in the last 168 hours. BNP (last 3 results) No results for input(s): PROBNP in the last 8760 hours. HbA1C: No results for input(s): HGBA1C in the last 72 hours. CBG: Recent Labs  Lab 07/11/20 0749 07/11/20 1219 07/11/20 1753 07/11/20 2118 07/12/20 0730  GLUCAP 115* 110* 146*  107* 82   Lipid Profile: No results for input(s): CHOL, HDL, LDLCALC, TRIG, CHOLHDL, LDLDIRECT in the last 72 hours. Thyroid Function Tests: No results for input(s): TSH, T4TOTAL, FREET4, T3FREE, THYROIDAB in the last 72 hours. Anemia Panel: No results for input(s): VITAMINB12, FOLATE, FERRITIN, TIBC, IRON, RETICCTPCT in the last 72 hours. Sepsis Labs: No results for input(s): PROCALCITON, LATICACIDVEN in the last 168 hours.  Recent Results (from the past 240 hour(s))  Resp Panel by RT-PCR (Flu A&B, Covid) Nasopharyngeal Swab     Status: None   Collection Time: 07/05/20  5:06 PM   Specimen: Nasopharyngeal Swab; Nasopharyngeal(NP) swabs in vial transport medium  Result Value Ref Range Status   SARS Coronavirus 2 by RT PCR NEGATIVE NEGATIVE Final    Comment: (NOTE) SARS-CoV-2 target nucleic acids  are NOT DETECTED.  The SARS-CoV-2 RNA is generally detectable in upper respiratory specimens during the acute phase of infection. The lowest concentration of SARS-CoV-2 viral copies this assay can detect is 138 copies/mL. A negative result does not preclude SARS-Cov-2 infection and should not be used as the sole basis for treatment or other patient management decisions. A negative result may occur with  improper specimen collection/handling, submission of specimen other than nasopharyngeal swab, presence of viral mutation(s) within the areas targeted by this assay, and inadequate number of viral copies(<138 copies/mL). A negative result must be combined with clinical observations, patient history, and epidemiological information. The expected result is Negative.  Fact Sheet for Patients:  EntrepreneurPulse.com.au  Fact Sheet for Healthcare Providers:  IncredibleEmployment.be  This test is no t yet approved or cleared by the [place] FDA and  has been authorized for detection and/or diagnosis of SARS-CoV-2 by FDA under an Emergency Use  Authorization (EUA). This EUA will remain  in effect (meaning this test can be used) for the duration of the COVID-19 declaration under Section 564(b)(1) of the Act, 21 U.S.C.section 360bbb-3(b)(1), unless the authorization is terminated  or revoked sooner.       Influenza A by PCR NEGATIVE NEGATIVE Final   Influenza B by PCR NEGATIVE NEGATIVE Final    Comment: (NOTE) The Xpert Xpress SARS-CoV-2/FLU/RSV plus assay is intended as an aid in the diagnosis of influenza from Nasopharyngeal swab specimens and should not be used as a sole basis for treatment. Nasal washings and aspirates are unacceptable for Xpert Xpress SARS-CoV-2/FLU/RSV testing.  Fact Sheet for Patients: EntrepreneurPulse.com.au  Fact Sheet for Healthcare Providers: IncredibleEmployment.be  This test is not yet approved or cleared by the [place] FDA and has been authorized for detection and/or diagnosis of SARS-CoV-2 by FDA under an Emergency Use Authorization (EUA). This EUA will remain in effect (meaning this test can be used) for the duration of the COVID-19 declaration under Section 564(b)(1) of the Act, 21 U.S.C. section 360bbb-3(b)(1), unless the authorization is terminated or revoked.  Performed at [facility], [place] [address], [place], [place] [address]   Culture, blood (routine x 2)     Status: None   Collection Time: 07/06/20  1:47 PM   Specimen: BLOOD  Result Value Ref Range Status   Specimen Description   Final    BLOOD RIGHT ANTECUBITAL Performed at [facility] [address] [place], [place] [address]    Special Requests   Final    BOTTLES DRAWN AEROBIC ONLY Blood Culture adequate volume Performed at [facility] [address], [place], [place] [address]    Culture   Final    NO GROWTH 5 DAYS Performed at [facility], [place] [address] [place], [place] [address]    Report Status  07/11/2020 FINAL  Final  Culture, blood (routine x 2)     Status: None   Collection Time: 07/06/20  1:47 PM   Specimen: BLOOD RIGHT HAND  Result Value Ref Range Status   Specimen Description   Final    BLOOD RIGHT HAND Performed at [facility] [address] [place], [place] [address]    Special Requests   Final    BOTTLES DRAWN AEROBIC ONLY Blood Culture adequate volume Performed at [facility] [address] [place], [place] [address]    Culture   Final    NO GROWTH 5 DAYS Performed at [facility],  [address] [place],  [address]    Report Status 07/11/2020 FINAL  Final         Radiology Studies: No results found.      Scheduled Meds: . cholecalciferol  5,000 Units Oral Daily  . famotidine  40 mg Oral Daily  . furosemide  20 mg Oral Daily  . insulin aspart  0-15 Units Subcutaneous TID WC  . insulin glargine  10 Units Subcutaneous Daily  . linaclotide  145 mcg Oral QAC breakfast  . metoprolol succinate  75 mg Oral Daily  . mometasone-formoterol  2 puff Inhalation BID  . montelukast  10 mg Oral QHS  . nystatin   Topical TID  . olopatadine  1 drop Both Eyes BID  . pantoprazole  40 mg Oral Daily  . pravastatin  40 mg Oral QHS  . pregabalin  100 mg Oral QHS  . [START ON 07/13/2020] romiPLOStim  5 mcg/kg Subcutaneous Weekly  . sertraline  100 mg Oral QHS  . traZODone  100 mg Oral QHS   Continuous Infusions:   LOS: 5 days   Georgette Shell, MD  07/12/2020, 10:36 AM

## 2020-07-12 NOTE — Progress Notes (Signed)
Physical Therapy Treatment Patient Details Name: Kathy Irwin MRN: [id number] DOB: October 07, 1950 Today's Date: 07/12/2020    History of Present Illness 70 y.o. female with medical history significant of chronic myelomonocytic leukemia, chronic diastolic heart failure, chronic anemia, diabetes mellitus type 2, hyperlipidemia, hypersensitivity pneumonitis, interstitial lung disease, GERD, thrombocytopenia. Recent hip fracture in january 2022.I & D for R hip infection postponed 2* thrombocytopenia.    PT Comments    Pt in bed on 3 lts at 96%.  Assisted OOB.  General bed mobility comments: used rail, increased time pt able.  General transfer comment: good hand placement and safety awareness.  Also self able  on/off toilet with use of grab bar. General Gait Details: assisted with amb to and from bathroom only this session due to fatigue along with mild dyspnea.  Pt at rest 3 lts at 95% but required 4 lts to achieve sats > 90%.  Pt also required an extended rest break on toilet.  "I can usually do more than this". Pt also c/o mild tremors and admits to being a "little nervous" this morning.  "But I don't know why" stated pt.  Pt did agree to sit up in recliner "a while".  Pt plans to return home.    Follow Up Recommendations  Home health PT     Equipment Recommendations  None recommended by PT    Recommendations for Other Services       Precautions / Restrictions Precautions Precautions: Fall Precaution Comments: monitor sats    Mobility  Bed Mobility Overal bed mobility: Modified Independent             General bed mobility comments: used rail, increased time pt able    Transfers Overall transfer level: Needs assistance Equipment used: Rolling walker (2 wheeled) Transfers: Sit to/from [facility] Sit to Stand: Supervision Stand pivot transfers: Supervision       General transfer comment: good hand placement and safety awareness.  Also self able  on/off toilet  with use of grab bar.  Ambulation/Gait Ambulation/Gait assistance: Supervision Gait Distance (Feet): 22 Feet (11 feet x 2 to and from bathroom) Assistive device: Rolling walker (2 wheeled) Gait Pattern/deviations: Step-to pattern;Decreased weight shift to right;Decreased step length - right;Decreased step length - left Gait velocity: decreased   General Gait Details: assisted with amb to and from bathroom only this session due to fatigue along with mild dyspnea.  Pt at rest 3 lts at 95% but required 4 lts to achieve sats > 90%.  Pt also required an extended rest break on toilet.  "I can usually do more than this". Pt also c/o mild tremors and admits to being a "little nervous" this morning.  "But I don't know why" stated pt.  Pt did agree to sit up in recliner "a while".   Stairs             Wheelchair Mobility    Modified Rankin (Stroke Patients Only)       Balance                                            Cognition Arousal/Alertness: Awake/alert Behavior During Therapy: WFL for tasks assessed/performed Overall Cognitive Status: Within Functional Limits for tasks assessed  General Comments: AxO x 3 very motivated and pleasant      Exercises      General Comments        Pertinent Vitals/Pain Pain Assessment: 0-10 Pain Score: 5  Pain Location: R hip Pain Descriptors / Indicators: Grimacing;Guarding Pain Intervention(s): Monitored during session;Repositioned    Home Living                      Prior Function            PT Goals (current goals can now be found in the care plan section) Progress towards PT goals: Progressing toward goals    Frequency    Min 3X/week      PT Plan Current plan remains appropriate    Co-evaluation              AM-PAC PT "6 Clicks" Mobility   Outcome Measure  Help needed turning from your back to your side while in a flat bed without  using bedrails?: None Help needed moving from lying on your back to sitting on the side of a flat bed without using bedrails?: None Help needed moving to and from a bed to a chair (including a wheelchair)?: None Help needed standing up from a chair using your arms (e.g., wheelchair or bedside chair)?: None Help needed to walk in hospital room?: A Little Help needed climbing 3-5 steps with a railing? : A Lot 6 Click Score: 21    End of Session Equipment Utilized During Treatment: Gait belt Activity Tolerance: Patient limited by fatigue Patient left: with call bell/phone within reach;with bed alarm set;in chair Nurse Communication: Mobility status PT Visit Diagnosis: Difficulty in walking, not elsewhere classified (R26.2);Pain Pain - Right/Left: Right Pain - part of body: Hip     Time: 1010-1040 PT Time Calculation (min) (ACUTE ONLY): 30 min  Charges:  $Gait Training: 8-22 mins $Therapeutic Activity: 8-22 mins                     {Fern Canova  PTA Acute  Rehabilitation Services Pager      [phone] Office      [phone]

## 2020-07-12 NOTE — Progress Notes (Addendum)
HEMATOLOGY-ONCOLOGY PROGRESS NOTE  SUBJECTIVE:  Kathy Irwin was seen for hematology follow-up today.  No bleeding reported.  Continues to have pain to her right hip but she reports this is somewhat improved.  No fevers.  Notes no other acute new symptoms.  Good p.o. intake.  REVIEW OF SYSTEMS:   Constitutional: Denies fevers and chills Eyes: Denies blurriness of vision Ears, nose, mouth, throat, and face: Denies mucositis or sore throat Respiratory: Denies cough, dyspnea or wheezes Cardiovascular: Denies palpitation, chest discomfort Gastrointestinal:  Denies nausea, heartburn or change in bowel habits Skin: Denies abnormal skin rashes Lymphatics: Denies new lymphadenopathy or easy bruising Neurological:Denies numbness, tingling or new weaknesses Behavioral/Psych: Mood is stable, no new changes  Extremities: No lower extremity edema All other systems were reviewed with the patient and are negative.  I have reviewed the past medical history, past surgical history, social history and family history with the patient and they are unchanged from previous note.   PHYSICAL EXAMINATION: ECOG PERFORMANCE STATUS: 2 - Symptomatic, <50% confined to bed  Vitals:   07/11/20 2003 07/12/20 0551  BP: 120/68 (!) 135/58  Pulse: 82 96  Resp: 16 16  Temp: 98.1 F (36.7 C) 98.1 F (36.7 C)  SpO2: 100% 100%   Filed Weights   07/05/20 2000  Weight: 108.9 kg    Intake/Output from previous day: 03/27 0701 - 03/28 0700 In: 120 [P.O.:120] Out: 1350 [Urine:1350]  GENERAL:alert, no distress and comfortable SKIN: skin color, texture, turgor are normal, no rashes or significant lesions EYES: normal, Conjunctiva are pink and non-injected, sclera clear OROPHARYNX:no exudate, no erythema and lips, buccal mucosa, and tongue normal  NECK: supple, thyroid normal size, non-tender, without nodularity LYMPH:  no palpable lymphadenopathy in the cervical, axillary or inguinal LUNGS: clear to auscultation and  percussion with normal breathing effort HEART: regular rate & rhythm and no murmurs and no lower extremity edema ABDOMEN:abdomen soft, non-tender and normal bowel sounds Musculoskeletal:no cyanosis of digits and no clubbing  NEURO: alert & oriented x 3 with fluent speech, no focal motor/sensory deficits  LABORATORY DATA:  I have reviewed the data as listed CMP Latest Ref Rng & Units 07/06/2020 07/05/2020 07/05/2020  Glucose 70 - 99 mg/dL 113(H) 145(H) -  BUN 8 - 23 mg/dL 8 9 -  Creatinine 0.44 - 1.00 mg/dL 0.54 0.68 -  Sodium 135 - 145 mmol/L 138 139 -  Potassium 3.5 - 5.1 mmol/L 3.8 4.2 -  Chloride 98 - 111 mmol/L 105 106 -  CO2 22 - 32 mmol/L 27 25 -  Calcium 8.9 - 10.3 mg/dL 9.2 9.6 -  Total Protein 6.5 - 8.1 g/dL 5.8(L) 6.6 -  Total Bilirubin 0.3 - 1.2 mg/dL 2.5(H) 2.1(H) 2.3(H)  Alkaline Phos 38 - 126 U/L 60 71 -  AST 15 - 41 U/L 13(L) 13(L) -  ALT 0 - 44 U/L 10 10 -    Lab Results  Component Value Date   WBC 4.7 07/12/2020   HGB 8.2 (L) 07/12/2020   HCT 26.4 (L) 07/12/2020   MCV 98.9 07/12/2020   PLT 20 (LL) 07/12/2020   NEUTROABS 1.0 (L) 07/08/2020    CT HIP RIGHT WO CONTRAST  Result Date: 06/23/2020 CLINICAL DATA:  Recent right hip ORIF for intertrochanteric fracture presenting with low platelets and anemia. EXAM: CT OF THE RIGHT HIP WITHOUT CONTRAST TECHNIQUE: Multidetector CT imaging of the right hip was performed according to the standard protocol. Multiplanar CT image reconstructions were also generated. COMPARISON:  Radiographs 04/30/2020 FINDINGS: Bones/Joint/Cartilage  Redemonstration of the a highly comminuted inter trochanteric right femur fracture post intramedullary nail placement with transcervical pinning. There is some developing callus formation about the numerous fracture fragments. Some low-intermediate attenuation surrounding the fracture fragments likely reflects a small amount of intramedullary hematoma with additional seroma in stranding extending to the  skin surface of the lateral soft tissues of hip. No acute hardware complication or failure is seen otherwise. There is a background of severe degenerative changes in the left hip including larger geode formation along the articular surface of the femoral head as well as extensive periarticular spurring about the acetabulum. No acute fracture of the included bones of the pelvis is seen. Ligaments Suboptimally assessed by CT. Muscles and Tendons Intramuscular stranding and thickening a seen along the surgical approach the patient's intramedullary nail placement. Small of intramuscular thickening and possible hematoma/seroma subjacent to the iliotibial band. No frankly or retracted tendons nor other acute musculotendinous injury. Soft tissues Soft tissue swelling and edematous changes of the right lower extremity most pronounced laterally with scar stranding and scarring related to the surgical approach. No soft tissue gas or unexpected foreign body is seen. Included portions of the pelvis are free of acute abnormality. IMPRESSION: 1. Redemonstration of the highly comminuted inter trochanteric right femur fracture post intramedullary nail placement with transcervical pinning. Developing callus formation about the numerous fracture fragments. Some minimal low-intermediate attenuation surrounding the fracture fragments likely reflects only a small amount residual hematoma or seroma with additional scarring and thickening extending to the skin surface of the lateral soft tissues of the hip. 2. No other acute osseous injury or traumatic malalignment. No other large collection or hematoma is seen. 3. Background of severe degenerative changes in the left hip. Electronically Signed   By: Lovena Le M.D.   On: 06/23/2020 22:21   CT HIP RIGHT W CONTRAST  Result Date: 07/07/2020 CLINICAL DATA:  Thrombocytopenia, right subtrochanteric fracture. Draining wound for 4 weeks. EXAM: CT OF THE LOWER RIGHT EXTREMITY WITH CONTRAST  TECHNIQUE: Multidetector CT imaging of the lower right extremity was performed according to the standard protocol following intravenous contrast administration. CONTRAST:  127m OMNIPAQUE IOHEXOL 300 MG/ML  SOLN COMPARISON:  05/10/2020 FINDINGS: Bones/Joint/Cartilage Ununited comminuted right intertrochanteric fracture transfixed with a intramedullary nail and interlocking femoral neck screws and mild callus formation across the bone fragments. Persistent low-attenuation material around the fracture fragments may reflect residual hemorrhagic material, but infection cannot be excluded. With a soft tissue track extending towards the skin surface beyond the field of view. No drainable fluid collection. No acute hardware failure or complication. 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 Normal alignment. No joint effusion. Ligaments Ligaments are suboptimally evaluated by CT. Muscles and Tendons Muscles are normal.  No muscle atrophy. Soft tissue No fluid collection or hematoma.  No soft tissue mass. IMPRESSION: 1. Ununited comminuted right intertrochanteric fracture transfixed with a intramedullary nail and interlocking femoral neck screws and mild callus formation across the bone fragments. Persistent low-attenuation material around the fracture fragments may reflect residual hemorrhagic material, but infection cannot be excluded. With a soft tissue track extending towards the skin surface beyond the field of view. No drainable fluid collection. If there is persistent clinical concern regarding soft tissue infection extending to the fracture site, an MRI of the right hip utilizing mars protocol may be helpful for characterization. Electronically Signed   By: HKathreen Devoid  On: 07/07/2020 10:35   CT BIOPSY  Result Date: 07/07/2020 INDICATION: Thrombocytopenia, history of CML  EXAM: CT GUIDED RIGHT ILIAC BONE MARROW ASPIRATION AND CORE BIOPSY Date:  07/07/2020 07/07/2020 10:21 am Radiologist:  M. Daryll Brod,  MD Guidance:  CT FLUOROSCOPY TIME:  Fluoroscopy Time: NONE. MEDICATIONS: 1% lidocaine ANESTHESIA/SEDATION: 2.0 mg IV Versed; 100 mcg IV Fentanyl Moderate Sedation Time:  12 minutes The patient was continuously monitored during the procedure by the interventional radiology nurse under my direct supervision. CONTRAST:  154m OMNIPAQUE IOHEXOL 300 MG/ML  SOLN COMPLICATIONS: None PROCEDURE: Informed consent was obtained from the patient following explanation of the procedure, risks, benefits and alternatives. The patient understands, agrees and consents for the procedure. All questions were addressed. A time out was performed. The patient was positioned prone and non-contrast localization CT was performed of the pelvis to demonstrate the iliac marrow spaces. Maximal barrier sterile technique utilized including caps, mask, sterile gowns, sterile gloves, large sterile drape, hand hygiene, and Betadine prep. Under sterile conditions and local anesthesia, an 11 gauge coaxial bone biopsy needle was advanced into the right iliac marrow space. Needle position was confirmed with CT imaging. Initially, bone marrow aspiration was performed. Next, the 11 gauge outer cannula was utilized to obtain a right iliac bone marrow core biopsy. Needle was removed. Hemostasis was obtained with compression. The patient tolerated the procedure well. Samples were prepared with the cytotechnologist. No immediate complications. IMPRESSION: CT guided right iliac bone marrow aspiration and core biopsy. Electronically Signed   By: MJerilynn Mages  Shick M.D.   On: 07/07/2020 11:46   CT BONE MARROW BIOPSY & ASPIRATION  Result Date: 07/07/2020 INDICATION: Thrombocytopenia, history of CML EXAM: CT GUIDED RIGHT ILIAC BONE MARROW ASPIRATION AND CORE BIOPSY Date:  07/07/2020 07/07/2020 10:21 am Radiologist:  M. TDaryll Brod MD Guidance:  CT FLUOROSCOPY TIME:  Fluoroscopy Time: NONE. MEDICATIONS: 1% lidocaine ANESTHESIA/SEDATION: 2.0 mg IV Versed; 100 mcg IV  Fentanyl Moderate Sedation Time:  12 minutes The patient was continuously monitored during the procedure by the interventional radiology nurse under my direct supervision. CONTRAST:  1057mOMNIPAQUE IOHEXOL 300 MG/ML  SOLN COMPLICATIONS: None PROCEDURE: Informed consent was obtained from the patient following explanation of the procedure, risks, benefits and alternatives. The patient understands, agrees and consents for the procedure. All questions were addressed. A time out was performed. The patient was positioned prone and non-contrast localization CT was performed of the pelvis to demonstrate the iliac marrow spaces. Maximal barrier sterile technique utilized including caps, mask, sterile gowns, sterile gloves, large sterile drape, hand hygiene, and Betadine prep. Under sterile conditions and local anesthesia, an 11 gauge coaxial bone biopsy needle was advanced into the right iliac marrow space. Needle position was confirmed with CT imaging. Initially, bone marrow aspiration was performed. Next, the 11 gauge outer cannula was utilized to obtain a right iliac bone marrow core biopsy. Needle was removed. Hemostasis was obtained with compression. The patient tolerated the procedure well. Samples were prepared with the cytotechnologist. No immediate complications. IMPRESSION: CT guided right iliac bone marrow aspiration and core biopsy. Electronically Signed   By: M.Jerilynn Mages Shick M.D.   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 ITP is likely since platelets improved significantly with recent steroid use. Additional factors worsening her platelet count are infection in her right hip surgical site with drainage and absorption of platelets, multiple lines of antibiotics etc. 3) Macrocytic anemia -possibly from blood loss with surgery  and ongoing drainage.  Could  also be from CMML.  Labs in stable at this time.  Hemoglobin improved after receiving 1 unit PRBC. PLAN:  -CBC from today has been reviewed.  Her hemoglobin is 8.2 and platelets are 20,000 today.  She has no bleeding today.  Recommend PRBC transfusion for hemoglobin less than 8 and platelet transfusion for platelet count less than 10,000 or active bleeding.   -Patient tolerated IVIG and Nplate without any acute issues.  She received a repeat dose of Nplate at 3 mcg/kg on 07/08/2020.  She received a second dose of IVIG on 07/09/2020. -Patient had bone marrow biopsy performed 07/07/2020 and tolerated the procedure well.  Final pathology report now available which shows features consistent with previously known CMML and peripheral blood shows features suggestive of an element of hemolysis. [facility] medicine and infectious disease are following to determine management of the patient's nonhealing right hip fracture issue with chronic drainage -Hematology will continue to follow  Mikey Bussing 07/12/20   ADDENDUM  Surgical Pathology  CASE: [id number]  PATIENT: Kathy Irwin  Bone Marrow Report   Clinical History: CMML, thrombocytopenia, right, (ADC)  DIAGNOSIS:   BONE MARROW, ASPIRATE, CLOT, CORE:  -Hypercellular bone marrow with dyspoietic changes.  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 The peripheral blood  shows features suggestive of an element of hemolysis. Clinical and  cytogenetic correlation is recommended.    Patient was seen at bedside with her daughter.  I discussed her current labs clinical status and bone marrow biopsy results in details with her daughter at bedside and again with her second daughter on the phone at bedside. No overt  transformation of her CMML to an acute leukemia. The lack of adequate response to IVIG and Nplate at this time suggests her thrombocytopenia is is likely not completely related to ITP alone but is also related to her CMML.  We discussed the challenge with her functional status and her right hip ongoing drainage/nonhealing wound/infection and how this pertains to her platelet counts and treatment for CMML. She understands she is in a very complicated situation. PLAN -From a hematology standpoint we will plan to continue weekly Nplate for her platelets she may be discharged to continue this treatment as outpatient. [facility] medicine will need to coordinate with infectious disease and orthopedics regarding the definitive plan for her right hip nonhealing wound which is possibly infected.  It is to be determined if she needs a surgical I&D and if so might need to remain in the hospital based on orthopedics timeline and at this time might need platelet transfusions to be able to achieve this. -Unless the right hip nonhealing wound/infection is completely addressed we cannot start the patient on [place] for treatment of her CMML.  Also the right hip infection/nonhealing wound with continued drainage will consume platelets and potentially contribute to the thrombocytopenia. -Evaluation by therapies to determine discharge needs Home with home PT versus SNF. -Patient will also need to have a plan of pain management by orthopedics since we do not do noncancer chronic pain management in the cancer center.  Sullivan Lone MD [place]  TT 63 mins. >50% on direct patient contact/counseling and co-ordination of cares/goals of care discussion.

## 2020-07-12 NOTE — Care Management Important Message (Signed)
Important Message  Patient Details IM Letter given to the Patient. Name: Kathy Irwin MRN: [id number] Date of Birth: January 21, 1951   Medicare Important Message Given:  Yes     Kerin Salen 07/12/2020, 10:37 AM

## 2020-07-12 NOTE — Progress Notes (Incomplete Revision)
HEMATOLOGY-ONCOLOGY PROGRESS NOTE  SUBJECTIVE:  [name] was seen for hematology follow-up today.  No bleeding reported.  Continues to have pain to her right hip but she reports this is somewhat improved.  No fevers.  Notes no other acute new symptoms.  Good p.o. intake.  REVIEW OF SYSTEMS:   Constitutional: Denies fevers and chills Eyes: Denies blurriness of vision Ears, nose, mouth, throat, and face: Denies mucositis or sore throat Respiratory: Denies cough, dyspnea or wheezes Cardiovascular: Denies palpitation, chest discomfort Gastrointestinal:  Denies nausea, heartburn or change in bowel habits Skin: Denies abnormal skin rashes Lymphatics: Denies new lymphadenopathy or easy bruising Neurological:Denies numbness, tingling or new weaknesses Behavioral/Psych: Mood is stable, no new changes  Extremities: No lower extremity edema All other systems were reviewed with the patient and are negative.  I have reviewed the past medical history, past surgical history, social history and family history with the patient and they are unchanged from previous note.   PHYSICAL EXAMINATION: ECOG PERFORMANCE STATUS: 2 - Symptomatic, <50% confined to bed  Vitals:   07/11/20 2003 07/12/20 0551  BP: 120/68 (!) 135/58  Pulse: 82 96  Resp: 16 16  Temp: 98.1 F (36.7 C) 98.1 F (36.7 C)  SpO2: 100% 100%   Filed Weights   07/05/20 2000  Weight: 108.9 kg    Intake/Output from previous day: 03/27 0701 - 03/28 0700 In: 120 [P.O.:120] Out: 1350 [Urine:1350]  GENERAL:alert, no distress and comfortable SKIN: skin color, texture, turgor are normal, no rashes or significant lesions EYES: normal, Conjunctiva are pink and non-injected, sclera clear OROPHARYNX:no exudate, no erythema and lips, buccal mucosa, and tongue normal  NECK: supple, thyroid normal size, non-tender, without nodularity LYMPH:  no palpable lymphadenopathy in the cervical, axillary or inguinal LUNGS: clear to auscultation and  percussion with normal breathing effort HEART: regular rate & rhythm and no murmurs and no lower extremity edema ABDOMEN:abdomen soft, non-tender and normal bowel sounds Musculoskeletal:no cyanosis of digits and no clubbing  NEURO: alert & oriented x 3 with fluent speech, no focal motor/sensory deficits  LABORATORY DATA:  I have reviewed the data as listed CMP Latest Ref Rng & Units 07/06/2020 07/05/2020 07/05/2020  Glucose 70 - 99 mg/dL 113(H) 145(H) -  BUN 8 - 23 mg/dL 8 9 -  Creatinine 0.44 - 1.00 mg/dL 0.54 0.68 -  Sodium 135 - 145 mmol/L 138 139 -  Potassium 3.5 - 5.1 mmol/L 3.8 4.2 -  Chloride 98 - 111 mmol/L 105 106 -  CO2 22 - 32 mmol/L 27 25 -  Calcium 8.9 - 10.3 mg/dL 9.2 9.6 -  Total Protein 6.5 - 8.1 g/dL 5.8(L) 6.6 -  Total Bilirubin 0.3 - 1.2 mg/dL 2.5(H) 2.1(H) 2.3(H)  Alkaline Phos 38 - 126 U/L 60 71 -  AST 15 - 41 U/L 13(L) 13(L) -  ALT 0 - 44 U/L 10 10 -    Lab Results  Component Value Date   WBC 4.7 07/12/2020   HGB 8.2 (L) 07/12/2020   HCT 26.4 (L) 07/12/2020   MCV 98.9 07/12/2020   PLT 20 (LL) 07/12/2020   NEUTROABS 1.0 (L) 07/08/2020    CT HIP RIGHT WO CONTRAST  Result Date: 06/23/2020 CLINICAL DATA:  Recent right hip ORIF for intertrochanteric fracture presenting with low platelets and anemia. EXAM: CT OF THE RIGHT HIP WITHOUT CONTRAST TECHNIQUE: Multidetector CT imaging of the right hip was performed according to the standard protocol. Multiplanar CT image reconstructions were also generated. COMPARISON:  Radiographs 04/30/2020 FINDINGS: Bones/Joint/Cartilage  Redemonstration of the a highly comminuted inter trochanteric right femur fracture post intramedullary nail placement with transcervical pinning. There is some developing callus formation about the numerous fracture fragments. Some low-intermediate attenuation surrounding the fracture fragments likely reflects a small amount of intramedullary hematoma with additional seroma in stranding extending to the  skin surface of the lateral soft tissues of hip. No acute hardware complication or failure is seen otherwise. There is a background of severe degenerative changes in the left hip including larger geode formation along the articular surface of the femoral head as well as extensive periarticular spurring about the acetabulum. No acute fracture of the included bones of the pelvis is seen. Ligaments Suboptimally assessed by CT. Muscles and Tendons Intramuscular stranding and thickening a seen along the surgical approach the patient's intramedullary nail placement. Small of intramuscular thickening and possible hematoma/seroma subjacent to the iliotibial band. No frankly or retracted tendons nor other acute musculotendinous injury. Soft tissues Soft tissue swelling and edematous changes of the right lower extremity most pronounced laterally with scar stranding and scarring related to the surgical approach. No soft tissue gas or unexpected foreign body is seen. Included portions of the pelvis are free of acute abnormality. IMPRESSION: 1. Redemonstration of the highly comminuted inter trochanteric right femur fracture post intramedullary nail placement with transcervical pinning. Developing callus formation about the numerous fracture fragments. Some minimal low-intermediate attenuation surrounding the fracture fragments likely reflects only a small amount residual hematoma or seroma with additional scarring and thickening extending to the skin surface of the lateral soft tissues of the hip. 2. No other acute osseous injury or traumatic malalignment. No other large collection or hematoma is seen. 3. Background of severe degenerative changes in the left hip. Electronically Signed   By: Lovena Le M.D.   On: 06/23/2020 22:21   CT HIP RIGHT W CONTRAST  Result Date: 07/07/2020 CLINICAL DATA:  Thrombocytopenia, right subtrochanteric fracture. Draining wound for 4 weeks. EXAM: CT OF THE LOWER RIGHT EXTREMITY WITH CONTRAST  TECHNIQUE: Multidetector CT imaging of the lower right extremity was performed according to the standard protocol following intravenous contrast administration. CONTRAST:  127m OMNIPAQUE IOHEXOL 300 MG/ML  SOLN COMPARISON:  05/10/2020 FINDINGS: Bones/Joint/Cartilage Ununited comminuted right intertrochanteric fracture transfixed with a intramedullary nail and interlocking femoral neck screws and mild callus formation across the bone fragments. Persistent low-attenuation material around the fracture fragments may reflect residual hemorrhagic material, but infection cannot be excluded. With a soft tissue track extending towards the skin surface beyond the field of view. No drainable fluid collection. No acute hardware failure or complication. 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 Normal alignment. No joint effusion. Ligaments Ligaments are suboptimally evaluated by CT. Muscles and Tendons Muscles are normal.  No muscle atrophy. Soft tissue No fluid collection or hematoma.  No soft tissue mass. IMPRESSION: 1. Ununited comminuted right intertrochanteric fracture transfixed with a intramedullary nail and interlocking femoral neck screws and mild callus formation across the bone fragments. Persistent low-attenuation material around the fracture fragments may reflect residual hemorrhagic material, but infection cannot be excluded. With a soft tissue track extending towards the skin surface beyond the field of view. No drainable fluid collection. If there is persistent clinical concern regarding soft tissue infection extending to the fracture site, an MRI of the right hip utilizing mars protocol may be helpful for characterization. Electronically Signed   By: HKathreen Devoid  On: 07/07/2020 10:35   CT BIOPSY  Result Date: 07/07/2020 INDICATION: Thrombocytopenia, history of CML  EXAM: CT GUIDED RIGHT ILIAC BONE MARROW ASPIRATION AND CORE BIOPSY Date:  07/07/2020 07/07/2020 10:21 am Radiologist:  M. Daryll Brod,  MD Guidance:  CT FLUOROSCOPY TIME:  Fluoroscopy Time: NONE. MEDICATIONS: 1% lidocaine ANESTHESIA/SEDATION: 2.0 mg IV Versed; 100 mcg IV Fentanyl Moderate Sedation Time:  12 minutes The patient was continuously monitored during the procedure by the interventional radiology nurse under my direct supervision. CONTRAST:  154m OMNIPAQUE IOHEXOL 300 MG/ML  SOLN COMPLICATIONS: None PROCEDURE: Informed consent was obtained from the patient following explanation of the procedure, risks, benefits and alternatives. The patient understands, agrees and consents for the procedure. All questions were addressed. A time out was performed. The patient was positioned prone and non-contrast localization CT was performed of the pelvis to demonstrate the iliac marrow spaces. Maximal barrier sterile technique utilized including caps, mask, sterile gowns, sterile gloves, large sterile drape, hand hygiene, and Betadine prep. Under sterile conditions and local anesthesia, an 11 gauge coaxial bone biopsy needle was advanced into the right iliac marrow space. Needle position was confirmed with CT imaging. Initially, bone marrow aspiration was performed. Next, the 11 gauge outer cannula was utilized to obtain a right iliac bone marrow core biopsy. Needle was removed. Hemostasis was obtained with compression. The patient tolerated the procedure well. Samples were prepared with the cytotechnologist. No immediate complications. IMPRESSION: CT guided right iliac bone marrow aspiration and core biopsy. Electronically Signed   By: MJerilynn Mages  Shick M.D.   On: 07/07/2020 11:46   CT BONE MARROW BIOPSY & ASPIRATION  Result Date: 07/07/2020 INDICATION: Thrombocytopenia, history of CML EXAM: CT GUIDED RIGHT ILIAC BONE MARROW ASPIRATION AND CORE BIOPSY Date:  07/07/2020 07/07/2020 10:21 am Radiologist:  M. TDaryll Brod MD Guidance:  CT FLUOROSCOPY TIME:  Fluoroscopy Time: NONE. MEDICATIONS: 1% lidocaine ANESTHESIA/SEDATION: 2.0 mg IV Versed; 100 mcg IV  Fentanyl Moderate Sedation Time:  12 minutes The patient was continuously monitored during the procedure by the interventional radiology nurse under my direct supervision. CONTRAST:  1057mOMNIPAQUE IOHEXOL 300 MG/ML  SOLN COMPLICATIONS: None PROCEDURE: Informed consent was obtained from the patient following explanation of the procedure, risks, benefits and alternatives. The patient understands, agrees and consents for the procedure. All questions were addressed. A time out was performed. The patient was positioned prone and non-contrast localization CT was performed of the pelvis to demonstrate the iliac marrow spaces. Maximal barrier sterile technique utilized including caps, mask, sterile gowns, sterile gloves, large sterile drape, hand hygiene, and Betadine prep. Under sterile conditions and local anesthesia, an 11 gauge coaxial bone biopsy needle was advanced into the right iliac marrow space. Needle position was confirmed with CT imaging. Initially, bone marrow aspiration was performed. Next, the 11 gauge outer cannula was utilized to obtain a right iliac bone marrow core biopsy. Needle was removed. Hemostasis was obtained with compression. The patient tolerated the procedure well. Samples were prepared with the cytotechnologist. No immediate complications. IMPRESSION: CT guided right iliac bone marrow aspiration and core biopsy. Electronically Signed   By: M.Jerilynn Mages Shick M.D.   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 ITP is likely since platelets improved significantly with recent steroid use. Additional factors worsening her platelet count are infection in her right hip surgical site with drainage and absorption of platelets, multiple lines of antibiotics etc. 3) Macrocytic anemia -possibly from blood loss with surgery  and ongoing drainage.  Could  also be from CMML.  Labs in stable at this time.  Hemoglobin improved after receiving 1 unit PRBC. PLAN:  -CBC from today has been reviewed.  Her hemoglobin is 8.2 and platelets are 20,000 today.  She has no bleeding today.  Recommend PRBC transfusion for hemoglobin less than 8 and platelet transfusion for platelet count less than 10,000 or active bleeding.   -Patient tolerated IVIG and Nplate without any acute issues.  She received a repeat dose of Nplate at 3 mcg/kg on 07/08/2020.  She received a second dose of IVIG on 07/09/2020. -Patient had bone marrow biopsy performed 07/07/2020 and tolerated the procedure well.  Final pathology report now available which shows features consistent with previously known CMML and peripheral blood shows features suggestive of an element of hemolysis. [facility] medicine and infectious disease are following to determine management of the patient's nonhealing right hip fracture issue with chronic drainage -Hematology will continue to follow  Mikey Bussing 07/12/20   ADDENDUM  Surgical Pathology  CASE: [id number]  PATIENT: Francoise Halabi  Bone Marrow Report   Clinical History: CMML, thrombocytopenia, right, ([facility])  DIAGNOSIS:   BONE MARROW, ASPIRATE, CLOT, CORE:  -Hypercellular bone marrow with dyspoietic changes.  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 The peripheral blood  shows features suggestive of an element of hemolysis. Clinical and  cytogenetic correlation is recommended.

## 2020-07-13 DIAGNOSIS — T8149XA Infection following a procedure, other surgical site, initial encounter: Secondary | ICD-10-CM

## 2020-07-13 DIAGNOSIS — D696 Thrombocytopenia, unspecified: Secondary | ICD-10-CM | POA: Diagnosis not present

## 2020-07-13 DIAGNOSIS — D693 Immune thrombocytopenic purpura: Secondary | ICD-10-CM | POA: Diagnosis not present

## 2020-07-13 DIAGNOSIS — Z7189 Other specified counseling: Secondary | ICD-10-CM

## 2020-07-13 DIAGNOSIS — C9310 Chronic myelomonocytic leukemia not having achieved remission: Secondary | ICD-10-CM | POA: Diagnosis not present

## 2020-07-13 DIAGNOSIS — M009 Pyogenic arthritis, unspecified: Secondary | ICD-10-CM

## 2020-07-13 LAB — COMPREHENSIVE METABOLIC PANEL
ALT: 15 U/L (ref 0–44)
AST: 23 U/L (ref 15–41)
Albumin: 2.8 g/dL — ABNORMAL LOW (ref 3.5–5.0)
Alkaline Phosphatase: 68 U/L (ref 38–126)
Anion gap: 6 (ref 5–15)
BUN: 18 mg/dL (ref 8–23)
CO2: 26 mmol/L (ref 22–32)
Calcium: 9.4 mg/dL (ref 8.9–10.3)
Chloride: 105 mmol/L (ref 98–111)
Creatinine, Ser: 0.62 mg/dL (ref 0.44–1.00)
GFR, Estimated: >60 mL/min (ref >60)
Glucose, Bld: 91 mg/dL (ref 70–99)
Potassium: 4 mmol/L (ref 3.5–5.1)
Sodium: 137 mmol/L (ref 135–145)
Total Bilirubin: 1.7 mg/dL — ABNORMAL HIGH (ref 0.3–1.2)
Total Protein: 7.1 g/dL (ref 6.5–8.1)

## 2020-07-13 LAB — CBC
HCT: 28.6 % — ABNORMAL LOW (ref 36.0–46.0)
Hemoglobin: 8.6 g/dL — ABNORMAL LOW (ref 12.0–15.0)
MCH: 30.3 pg (ref 26.0–34.0)
MCHC: 30.1 g/dL (ref 30.0–36.0)
MCV: 100.7 fL — ABNORMAL HIGH (ref 80.0–100.0)
Platelets: 29 10*3/uL — CL (ref 150–400)
RBC: 2.84 MIL/uL — ABNORMAL LOW (ref 3.87–5.11)
RDW: 22.3 % — ABNORMAL HIGH (ref 11.5–15.5)
WBC: 5 10*3/uL (ref 4.0–10.5)
nRBC: 9.1 % — ABNORMAL HIGH (ref 0.0–0.2)

## 2020-07-13 LAB — C-REACTIVE PROTEIN: CRP: 10 mg/dL — ABNORMAL HIGH (ref <1.0)

## 2020-07-13 LAB — GLUCOSE, CAPILLARY
Glucose-Capillary: 105 mg/dL — ABNORMAL HIGH (ref 70–99)
Glucose-Capillary: 97 mg/dL (ref 70–99)
Glucose-Capillary: 74 mg/dL (ref 70–99)
Glucose-Capillary: 100 mg/dL — ABNORMAL HIGH (ref 70–99)

## 2020-07-13 MED ORDER — INSULIN GLARGINE 100 UNIT/ML ~~LOC~~ SOLN: 4.0000 [IU] | Freq: Every day | SUBCUTANEOUS | Status: DC

## 2020-07-13 MED ORDER — SODIUM CHLORIDE 0.9 % IV SOLN: Freq: Once | INTRAVENOUS | Status: AC

## 2020-07-13 MED ORDER — SODIUM CHLORIDE 0.9 % IV SOLN: INTRAVENOUS | Status: DC

## 2020-07-13 NOTE — Progress Notes (Addendum)
HEMATOLOGY-ONCOLOGY PROGRESS NOTE  SUBJECTIVE:  Kathy Irwin was seen for hematology follow-up today.  No bleeding reported.  Pain to her right hip overall improved.  No fevers.  Notes no other acute new symptoms.  Good p.o. intake.  REVIEW OF SYSTEMS:   Constitutional: Denies fevers and chills Eyes: Denies blurriness of vision Ears, nose, mouth, throat, and face: Denies mucositis or sore throat Respiratory: Denies cough, dyspnea or wheezes Cardiovascular: Denies palpitation, chest discomfort Gastrointestinal:  Denies nausea, heartburn or change in bowel habits Skin: Denies abnormal skin rashes Lymphatics: Denies new lymphadenopathy or easy bruising Neurological:Denies numbness, tingling or new weaknesses Behavioral/Psych: Mood is stable, no new changes  Extremities: No lower extremity edema All other systems were reviewed with the patient and are negative.  I have reviewed the past medical history, past surgical history, social history and family history with the patient and they are unchanged from previous note.   PHYSICAL EXAMINATION: ECOG PERFORMANCE STATUS: 2 - Symptomatic, <50% confined to bed  Vitals:   07/12/20 2036 07/13/20 0540  BP: (!) 108/58 (!) 102/47  Pulse: 87 (!) 103  Resp: 16 18  Temp: 98.2 F (36.8 C) 98.4 F (36.9 C)  SpO2: 99% 100%   Filed Weights   07/05/20 2000  Weight: 108.9 kg    Intake/Output from previous day: 03/28 0701 - 03/29 0700 In: 240 [P.O.:240] Out: 500 [Urine:500]  GENERAL:alert, no distress and comfortable SKIN: skin color, texture, turgor are normal, no rashes or significant lesions EYES: normal, Conjunctiva are pink and non-injected, sclera clear OROPHARYNX:no exudate, no erythema and lips, buccal mucosa, and tongue normal  NECK: supple, thyroid normal size, non-tender, without nodularity LYMPH:  no palpable lymphadenopathy in the cervical, axillary or inguinal LUNGS: clear to auscultation and percussion with normal breathing  effort HEART: regular rate & rhythm and no murmurs and no lower extremity edema ABDOMEN:abdomen soft, non-tender and normal bowel sounds Musculoskeletal:no cyanosis of digits and no clubbing  NEURO: alert & oriented x 3 with fluent speech, no focal motor/sensory deficits  LABORATORY DATA:  I have reviewed the data as listed CMP Latest Ref Rng & Units 07/06/2020 07/05/2020 07/05/2020  Glucose 70 - 99 mg/dL 113(H) 145(H) -  BUN 8 - 23 mg/dL 8 9 -  Creatinine 0.44 - 1.00 mg/dL 0.54 0.68 -  Sodium 135 - 145 mmol/L 138 139 -  Potassium 3.5 - 5.1 mmol/L 3.8 4.2 -  Chloride 98 - 111 mmol/L 105 106 -  CO2 22 - 32 mmol/L 27 25 -  Calcium 8.9 - 10.3 mg/dL 9.2 9.6 -  Total Protein 6.5 - 8.1 g/dL 5.8(L) 6.6 -  Total Bilirubin 0.3 - 1.2 mg/dL 2.5(H) 2.1(H) 2.3(H)  Alkaline Phos 38 - 126 U/L 60 71 -  AST 15 - 41 U/L 13(L) 13(L) -  ALT 0 - 44 U/L 10 10 -    Lab Results  Component Value Date   WBC 5.0 07/13/2020   HGB 8.6 (L) 07/13/2020   HCT 28.6 (L) 07/13/2020   MCV 100.7 (H) 07/13/2020   PLT 29 (LL) 07/13/2020   NEUTROABS 1.0 (L) 07/08/2020    CT HIP RIGHT WO CONTRAST  Result Date: 06/23/2020 CLINICAL DATA:  Recent right hip ORIF for intertrochanteric fracture presenting with low platelets and anemia. EXAM: CT OF THE RIGHT HIP WITHOUT CONTRAST TECHNIQUE: Multidetector CT imaging of the right hip was performed according to the standard protocol. Multiplanar CT image reconstructions were also generated. COMPARISON:  Radiographs 04/30/2020 FINDINGS: Bones/Joint/Cartilage Redemonstration of the a highly  comminuted inter trochanteric right femur fracture post intramedullary nail placement with transcervical pinning. There is some developing callus formation about the numerous fracture fragments. Some low-intermediate attenuation surrounding the fracture fragments likely reflects a small amount of intramedullary hematoma with additional seroma in stranding extending to the skin surface of the lateral  soft tissues of hip. No acute hardware complication or failure is seen otherwise. There is a background of severe degenerative changes in the left hip including larger geode formation along the articular surface of the femoral head as well as extensive periarticular spurring about the acetabulum. No acute fracture of the included bones of the pelvis is seen. Ligaments Suboptimally assessed by CT. Muscles and Tendons Intramuscular stranding and thickening a seen along the surgical approach the patient's intramedullary nail placement. Small of intramuscular thickening and possible hematoma/seroma subjacent to the iliotibial band. No frankly or retracted tendons nor other acute musculotendinous injury. Soft tissues Soft tissue swelling and edematous changes of the right lower extremity most pronounced laterally with scar stranding and scarring related to the surgical approach. No soft tissue gas or unexpected foreign body is seen. Included portions of the pelvis are free of acute abnormality. IMPRESSION: 1. Redemonstration of the highly comminuted inter trochanteric right femur fracture post intramedullary nail placement with transcervical pinning. Developing callus formation about the numerous fracture fragments. Some minimal low-intermediate attenuation surrounding the fracture fragments likely reflects only a small amount residual hematoma or seroma with additional scarring and thickening extending to the skin surface of the lateral soft tissues of the hip. 2. No other acute osseous injury or traumatic malalignment. No other large collection or hematoma is seen. 3. Background of severe degenerative changes in the left hip. Electronically Signed   By: Lovena Le M.D.   On: 06/23/2020 22:21   CT HIP RIGHT W CONTRAST  Result Date: 07/07/2020 CLINICAL DATA:  Thrombocytopenia, right subtrochanteric fracture. Draining wound for 4 weeks. EXAM: CT OF THE LOWER RIGHT EXTREMITY WITH CONTRAST TECHNIQUE: Multidetector CT  imaging of the lower right extremity was performed according to the standard protocol following intravenous contrast administration. CONTRAST:  154m OMNIPAQUE IOHEXOL 300 MG/ML  SOLN COMPARISON:  05/10/2020 FINDINGS: Bones/Joint/Cartilage Ununited comminuted right intertrochanteric fracture transfixed with a intramedullary nail and interlocking femoral neck screws and mild callus formation across the bone fragments. Persistent low-attenuation material around the fracture fragments may reflect residual hemorrhagic material, but infection cannot be excluded. With a soft tissue track extending towards the skin surface beyond the field of view. No drainable fluid collection. No acute hardware failure or complication. 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 Normal alignment. No joint effusion. Ligaments Ligaments are suboptimally evaluated by CT. Muscles and Tendons Muscles are normal.  No muscle atrophy. Soft tissue No fluid collection or hematoma.  No soft tissue mass. IMPRESSION: 1. Ununited comminuted right intertrochanteric fracture transfixed with a intramedullary nail and interlocking femoral neck screws and mild callus formation across the bone fragments. Persistent low-attenuation material around the fracture fragments may reflect residual hemorrhagic material, but infection cannot be excluded. With a soft tissue track extending towards the skin surface beyond the field of view. No drainable fluid collection. If there is persistent clinical concern regarding soft tissue infection extending to the fracture site, an MRI of the right hip utilizing mars protocol may be helpful for characterization. Electronically Signed   By: HKathreen Devoid  On: 07/07/2020 10:35   CT BIOPSY  Result Date: 07/07/2020 INDICATION: Thrombocytopenia, history of CML EXAM: CT GUIDED RIGHT ILIAC  BONE MARROW ASPIRATION AND CORE BIOPSY Date:  07/07/2020 07/07/2020 10:21 am Radiologist:  M. Daryll Brod, MD Guidance:  CT  FLUOROSCOPY TIME:  Fluoroscopy Time: NONE. MEDICATIONS: 1% lidocaine ANESTHESIA/SEDATION: 2.0 mg IV Versed; 100 mcg IV Fentanyl Moderate Sedation Time:  12 minutes The patient was continuously monitored during the procedure by the interventional radiology nurse under my direct supervision. CONTRAST:  170m OMNIPAQUE IOHEXOL 300 MG/ML  SOLN COMPLICATIONS: None PROCEDURE: Informed consent was obtained from the patient following explanation of the procedure, risks, benefits and alternatives. The patient understands, agrees and consents for the procedure. All questions were addressed. A time out was performed. The patient was positioned prone and non-contrast localization CT was performed of the pelvis to demonstrate the iliac marrow spaces. Maximal barrier sterile technique utilized including caps, mask, sterile gowns, sterile gloves, large sterile drape, hand hygiene, and Betadine prep. Under sterile conditions and local anesthesia, an 11 gauge coaxial bone biopsy needle was advanced into the right iliac marrow space. Needle position was confirmed with CT imaging. Initially, bone marrow aspiration was performed. Next, the 11 gauge outer cannula was utilized to obtain a right iliac bone marrow core biopsy. Needle was removed. Hemostasis was obtained with compression. The patient tolerated the procedure well. Samples were prepared with the cytotechnologist. No immediate complications. IMPRESSION: CT guided right iliac bone marrow aspiration and core biopsy. Electronically Signed   By: MJerilynn Mages  Shick M.D.   On: 07/07/2020 11:46   CT BONE MARROW BIOPSY & ASPIRATION  Result Date: 07/07/2020 INDICATION: Thrombocytopenia, history of CML EXAM: CT GUIDED RIGHT ILIAC BONE MARROW ASPIRATION AND CORE BIOPSY Date:  07/07/2020 07/07/2020 10:21 am Radiologist:  M. TDaryll Brod MD Guidance:  CT FLUOROSCOPY TIME:  Fluoroscopy Time: NONE. MEDICATIONS: 1% lidocaine ANESTHESIA/SEDATION: 2.0 mg IV Versed; 100 mcg IV Fentanyl Moderate  Sedation Time:  12 minutes The patient was continuously monitored during the procedure by the interventional radiology nurse under my direct supervision. CONTRAST:  1012mOMNIPAQUE IOHEXOL 300 MG/ML  SOLN COMPLICATIONS: None PROCEDURE: Informed consent was obtained from the patient following explanation of the procedure, risks, benefits and alternatives. The patient understands, agrees and consents for the procedure. All questions were addressed. A time out was performed. The patient was positioned prone and non-contrast localization CT was performed of the pelvis to demonstrate the iliac marrow spaces. Maximal barrier sterile technique utilized including caps, mask, sterile gowns, sterile gloves, large sterile drape, hand hygiene, and Betadine prep. Under sterile conditions and local anesthesia, an 11 gauge coaxial bone biopsy needle was advanced into the right iliac marrow space. Needle position was confirmed with CT imaging. Initially, bone marrow aspiration was performed. Next, the 11 gauge outer cannula was utilized to obtain a right iliac bone marrow core biopsy. Needle was removed. Hemostasis was obtained with compression. The patient tolerated the procedure well. Samples were prepared with the cytotechnologist. No immediate complications. IMPRESSION: CT guided right iliac bone marrow aspiration and core biopsy. Electronically Signed   By: M.Jerilynn Mages Shick M.D.   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 ITP is likely since platelets improved significantly with recent steroid use. Additional factors worsening her platelet count are infection in her right hip surgical site with drainage and absorption of platelets, multiple lines of antibiotics etc. 3) Macrocytic anemia -possibly from blood loss with surgery and ongoing drainage.  Could  also be from CMML.    PLAN:  -CBC from today has been reviewed.  Hemoglobin is slightly improved to 8.6 today and platelets are up to 29,000.  Recommend PRBC transfusion for hemoglobin less than 8 and platelet transfusion for platelet count less than 10,000 or active bleeding.   -She is status post IVIG x2 doses and Nplate. -Patient had bone marrow biopsy performed 07/07/2020 and tolerated the procedure well.  Final pathology report now available which shows features consistent with previously known CMML and peripheral blood shows features suggestive of an element of hemolysis. -The lack of adequate response to IVIG and Nplate at this time suggests her thrombocytopenia is is likely not completely related to ITP alone but is also related to her CMML. -From a hematology standpoint we will plan to continue weekly Nplate for her platelets she may be discharged to continue this treatment as outpatient. [facility] medicine will need to coordinate with infectious disease and orthopedics regarding the definitive plan for her right hip nonhealing wound which is possibly infected.  It is to be determined if she needs a surgical I&D and if so might need to remain in the hospital based on orthopedics timeline and at this time might need platelet transfusions to be able to achieve this. -Unless the right hip nonhealing wound/infection is completely addressed we cannot start the patient on [place] for treatment of her CMML.  Also the right hip infection/nonhealing wound with continued drainage will consume platelets and potentially contribute to the thrombocytopenia. -Evaluation by therapies to determine discharge needs Home with home PT versus SNF. -Patient will also need to have a plan of pain management by orthopedics since we do not do noncancer chronic pain management in the cancer center.  Mikey Bussing 07/13/20    ADDENDUM  .Patient was Personally and independently interviewed, examined and relevant elements of the history of  present illness were reviewed in details and an assessment and plan was created. All elements of the patient's history of present illness , assessment and plan were discussed in details with Mikey Bussing. The above documentation reflects our combined findings assessment and plan.  Platelets gradually improving.  Sullivan Lone MD MS

## 2020-07-13 NOTE — Progress Notes (Addendum)
PROGRESS NOTE    Kathy Irwin  EZM:[id number] DOB: September 18, 1950 DOA: 07/05/2020 PCP: Kerin Perna, NP    Brief Narrative:Kathy Irwin is a 70 y.o. femalewith medical history significant ofchronic myelomonocytic leukemia, chronic diastolic heart failure, chronic anemia, diabetes mellitus type 2, hyperlipidemia, hypersensitivity pneumonitis, interstitial lung disease, GERD, thrombocytopenia. Patient presented from oncology office for acute on chronic low platelets it was thought to be related to ITP.   Assessment & Plan:   Principal Problem:   Thrombocytopenia ([place]) Active Problems:   Hypersensitivity pneumonitis (HCC)   Obesity, Class III, BMI 40-49.9 (morbid obesity) ([place])   Essential hypertension   Postoperative wound infection of right hip   Hyperbilirubinemia   Idiopathic thrombocytopenia (HCC)   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[place])   Counseling regarding advance care planning and goals of care   #1 ITP -Baseline platelets around 50-60,000.  Followed by Dr. Irene Limbo as an outpatient.  Admission platelets were 17,000 down from 44,000 - 3 days prior to admission. Platelets slightly improved to 29 from 20 from 19 from 17, after 2 doses of Nplate and IVIG. Per oncology the lack of adequate response to IVIG and Nplate suggest her thrombocytopenia is not completely related to ITP alone but it could also be related to her CMML. Oncology recommends platelet transfusion less than 10,000 or if she is bleeding. She is status post bone marrow biopsy 07/07/2020.   Marland KitchenBONE MARROW, ASPIRATE, CLOT, CORE:  -Hypercellular bone marrow with dyspoietic changes.  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 The peripheral blood  shows features suggestive of an  element of hemolysis. Clinical and  cytogenetic correlation is recommended.  Received 1 dose of Solu-Medrol. Defer to oncology regarding IVIG/N plate  #2 right hip infection s/p intramedullary nail placement April 30, 2020.  She developed postop infection of the right hip with plans for irrigation and debridement.  This was canceled due to significant thrombocytopenia and lack of drainage from the right hip on the day of surgery.  Appreciate ID input.  ID recommended to watch her off of antibiotics.  CT hip with  no significant fluid collection. I have reconsulted ID today 07/13/2020. I have consulted Dr. Doreatha Martin today 07/13/2020.  He would like to have platelets above 50,000 for surgery.  If hip is not draining he likely will not do surgery.  Currently the hip has no to minimal drainage.  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  #5 type 2 diabetes-I have stopped her Lantus as  she is hypoglycemic and not eating.  Continue SSI. CBG (last 3)  Recent Labs    07/13/20 0721 07/13/20 1205 07/13/20 1619  GLUCAP 105* 97 74     #6 chronic myelomonocytic leukemia followed by oncology  #7 chronic diastolic heart failure/hypertension on Toprol and Lasix.  #8 hyperlipidemia on statin  #9 GERD on Protonix  #10 morbid obesity  #11 change in mental status-she is very sleepy today.  Staff reported she did not take her morning pills.  She falls back to sleep while talking to me.  Review of her chart shows her blood pressure is soft and she is tachycardic.  She is not febrile.  I am holding her Lasix, Norco, metoprolol, Lyrica and trazodone.  Stat CMP ordered.  Start IV fluids.  Estimated body mass index is 41.2 kg/m as calculated from the following:   Height as of this encounter: _0  (1.626 m).   Weight as of this encounter: 108.9 kg.  DVT prophylaxis: SCD  due to thrombocytopenia and anemia Code Status: Full code Family Communication: Discussed with patient's daughter. Disposition Plan:  Status is: Inpatient  dispo: The patient is from: Home              Anticipated d/c is to: SNF              Patient currently is not medically stable to d/c.   Difficult to place patient    Consultants:   Oncology and infectious disease  Procedures: Bone marrow biopsy 07/07/2020 Antimicrobials: None  Subjective: She is resting in bed.  She is very drowsy today.  She falls back asleep while talking to me.  This is new for her. She did not eat her breakfast today. No events overnight. Objective: Vitals:   07/12/20 1439 07/12/20 2036 07/13/20 0540 07/13/20 0925  BP: (!) 116/54 (!) 108/58 (!) 102/47   Pulse: 86 87 (!) 103   Resp: _1 Temp: 97.8 F (36.6 C) 98.2 F (36.8 C) 98.4 F (36.9 C)   TempSrc: Oral Oral Oral   SpO2: 100% 99% 100% 94%  Weight:      Height:        Intake/Output Summary (Last 24 hours) at 07/13/2020 1311 Last data filed at 07/13/2020 0543 Gross per 24 hour  Intake --  Output 200 ml  Net -200 ml   Filed Weights   07/05/20 2000  Weight: 108.9 kg    Examination:  General exam: Appears older than stated age, oral mucosa is dry. Respiratory system: Clear to auscultation. Respiratory effort normal. Cardiovascular system: S1 & S2 heard, RRR. No JVD, murmurs, rubs, gallops or clicks. No pedal edema. Gastrointestinal system: Abdomen is nondistended, soft and nontender. No organomegaly or masses felt. Normal bowel sounds heard. Central nervous system: Alert and oriented. No focal neurological deficits. Extremities: 1+ edema Skin: No rashes, lesions or ulcers Psychiatry: Judgement and insight appear normal. Mood & affect appropriate.     Data Reviewed: I have personally reviewed following labs and imaging studies  CBC: Recent Labs  Lab 07/07/20 0540 07/08/20 1044 07/09/20 0554 07/10/20 0626 07/11/20 0530  07/12/20 0448 07/13/20 0534  WBC 4.0 3.4* 4.2 3.7* 3.8* 4.7 5.0  NEUTROABS 1.5* 1.0*  --   --   --   --   --   HGB 8.5* 8.1* 7.6* 8.9* 8.8* 8.2* 8.6*  HCT 28.5* 27.4* 25.9* 29.0* 28.9* 26.4* 28.6*  MCV 105.2* 103.0* 102.4* 101.4* 100.7* 98.9 100.7*  PLT 18* 20* 17* 19* 19* 20* 29*   Basic Metabolic Panel: No results for input(s): NA, K, CL, CO2, GLUCOSE, BUN, CREATININE, CALCIUM, MG, PHOS in the last 168 hours. GFR: Estimated Creatinine Clearance: 78.9 mL/min (by C-G formula based on SCr of 0.54 mg/dL). Liver Function Tests: No results for input(s): AST, ALT, ALKPHOS, BILITOT, PROT, ALBUMIN in the last 168 hours. No results for input(s): LIPASE, AMYLASE in the last 168 hours. No results for input(s): AMMONIA in the last 168 hours. Coagulation Profile: No results for input(s): INR, PROTIME in the last 168 hours. Cardiac Enzymes: No results for input(s): CKTOTAL, CKMB, CKMBINDEX, TROPONINI in the last 168 hours. BNP (last 3 results) No  results for input(s): PROBNP in the last 8760 hours. HbA1C: No results for input(s): HGBA1C in the last 72 hours. CBG: Recent Labs  Lab 07/12/20 1138 07/12/20 1635 07/12/20 2038 07/13/20 0721 07/13/20 1205  GLUCAP 147* 115* 142* 105* 97   Lipid Profile: No results for input(s): CHOL, HDL, LDLCALC, TRIG, CHOLHDL, LDLDIRECT in the last 72 hours. Thyroid Function Tests: No results for input(s): TSH, T4TOTAL, FREET4, T3FREE, THYROIDAB in the last 72 hours. Anemia Panel: No results for input(s): VITAMINB12, FOLATE, FERRITIN, TIBC, IRON, RETICCTPCT in the last 72 hours. Sepsis Labs: No results for input(s): PROCALCITON, LATICACIDVEN in the last 168 hours.  Recent Results (from the past 240 hour(s))  Resp Panel by RT-PCR (Flu A&B, Covid) Nasopharyngeal Swab     Status: None   Collection Time: 07/05/20  5:06 PM   Specimen: Nasopharyngeal Swab; Nasopharyngeal(NP) swabs in vial transport medium  Result Value Ref Range Status   SARS Coronavirus 2  by RT PCR NEGATIVE NEGATIVE Final    Comment: (NOTE) SARS-CoV-2 target nucleic acids are NOT DETECTED.  The SARS-CoV-2 RNA is generally detectable in upper respiratory specimens during the acute phase of infection. The lowest concentration of SARS-CoV-2 viral copies this assay can detect is 138 copies/mL. A negative result does not preclude SARS-Cov-2 infection and should not be used as the sole basis for treatment or other patient management decisions. A negative result may occur with  improper specimen collection/handling, submission of specimen other than nasopharyngeal swab, presence of viral mutation(s) within the areas targeted by this assay, and inadequate number of viral copies(<138 copies/mL). A negative result must be combined with clinical observations, patient history, and epidemiological information. The expected result is Negative.  Fact Sheet for Patients:  EntrepreneurPulse.com.au  Fact Sheet for Healthcare Providers:  IncredibleEmployment.be  This test is no t yet approved or cleared by the [place] FDA and  has been authorized for detection and/or diagnosis of SARS-CoV-2 by FDA under an Emergency Use Authorization (EUA). This EUA will remain  in effect (meaning this test can be used) for the duration of the COVID-19 declaration under Section 564(b)(1) of the Act, 21 U.S.C.section 360bbb-3(b)(1), unless the authorization is terminated  or revoked sooner.       Influenza A by PCR NEGATIVE NEGATIVE Final   Influenza B by PCR NEGATIVE NEGATIVE Final    Comment: (NOTE) The Xpert Xpress SARS-CoV-2/FLU/RSV plus assay is intended as an aid in the diagnosis of influenza from Nasopharyngeal swab specimens and should not be used as a sole basis for treatment. Nasal washings and aspirates are unacceptable for Xpert Xpress SARS-CoV-2/FLU/RSV testing.  Fact Sheet for Patients: EntrepreneurPulse.com.au  Fact  Sheet for Healthcare Providers: IncredibleEmployment.be  This test is not yet approved or cleared by the [place] FDA and has been authorized for detection and/or diagnosis of SARS-CoV-2 by FDA under an Emergency Use Authorization (EUA). This EUA will remain in effect (meaning this test can be used) for the duration of the COVID-19 declaration under Section 564(b)(1) of the Act, 21 U.S.C. section 360bbb-3(b)(1), unless the authorization is terminated or revoked.  Performed at [facility], [place] [address] [place], [place] [address]   Culture, blood (routine x 2)     Status: None   Collection Time: 07/06/20  1:47 PM   Specimen: BLOOD  Result Value Ref Range Status   Specimen Description   Final    BLOOD RIGHT ANTECUBITAL Performed at [place] [address], [place],  [address]  Special Requests   Final    BOTTLES DRAWN AEROBIC ONLY Blood Culture adequate volume Performed at [place] [address] [place], [place] [address]    Culture   Final    NO GROWTH 5 DAYS Performed at [facility], [place] [address], [place], [place] [address]    Report Status 07/11/2020 FINAL  Final  Culture, blood (routine x 2)     Status: None   Collection Time: 07/06/20  1:47 PM   Specimen: BLOOD RIGHT HAND  Result Value Ref Range Status   Specimen Description   Final    BLOOD RIGHT HAND Performed at [place] [address], [place], [place] [address]    Special Requests   Final    BOTTLES DRAWN AEROBIC ONLY Blood Culture adequate volume Performed at [place] [address], [place], [place] [address]    Culture   Final    NO GROWTH 5 DAYS Performed at [facility], [place] [address], [place], [place] [address]    Report Status 07/11/2020 FINAL  Final         Radiology Studies: No results found.      Scheduled Meds: .  cholecalciferol  5,000 Units Oral Daily  . famotidine  40 mg Oral Daily  . insulin aspart  0-15 Units Subcutaneous TID WC  . insulin glargine  10 Units Subcutaneous Daily  . linaclotide  145 mcg Oral QAC breakfast  . mometasone-formoterol  2 puff Inhalation BID  . montelukast  10 mg Oral QHS  . nystatin   Topical TID  . olopatadine  1 drop Both Eyes BID  . pantoprazole  40 mg Oral Daily  . pravastatin  40 mg Oral QHS  . romiPLOStim  5 mcg/kg Subcutaneous Weekly  . sertraline  100 mg Oral QHS   Continuous Infusions: . sodium chloride       LOS: 6 days   Georgette Shell, MD  07/13/2020, 1:11 PM

## 2020-07-13 NOTE — Progress Notes (Signed)
PHYSICAL THERAPY  I checked on this pt several times throughout the day.  She was sleepy, groggy and repeatable declined any OOB activity.  "I just can't today" stated pt.  Will attempt to see tomorrow.  Rica Koyanagi  PTA Acute  Rehabilitation Services Pager      [phone] Office      [phone]

## 2020-07-13 NOTE — Progress Notes (Addendum)
[facility] for Infectious Disease    Date of Admission:  07/05/2020   Total days of antibiotics            ID: Kathy Irwin is a 70 y.o. female with   Principal Problem:   Thrombocytopenia ([place]) Active Problems:   Hypersensitivity pneumonitis (HCC)   Obesity, Class III, BMI 40-49.9 (morbid obesity) ([place])   Essential hypertension   Postoperative wound infection of right hip   Hyperbilirubinemia   Idiopathic thrombocytopenia (HCC)   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[place])   Counseling regarding advance care planning and goals of care    Subjective: Called by primary team to reevaluate hip and see if abx indicated Reviewed chart notes from primary team/oncology Reviewed previous labs   Per patient and chart, the right hip previous infection site looking better, without purulence/swelling/drainage Per dr Rodena Piety patient appeared more lethargic today  Medications:  . cholecalciferol  5,000 Units Oral Daily  . famotidine  40 mg Oral Daily  . insulin aspart  0-15 Units Subcutaneous TID WC  . [START ON 07/14/2020] insulin glargine  4 Units Subcutaneous Daily  . linaclotide  145 mcg Oral QAC breakfast  . mometasone-formoterol  2 puff Inhalation BID  . montelukast  10 mg Oral QHS  . nystatin   Topical TID  . olopatadine  1 drop Both Eyes BID  . pantoprazole  40 mg Oral Daily  . pravastatin  40 mg Oral QHS  . romiPLOStim  5 mcg/kg Subcutaneous Weekly  . sertraline  100 mg Oral QHS    Objective: Vital signs in last 24 hours: Temp:  [97.8 F (36.6 C)-98.4 F (36.9 C)] 98.4 F (36.9 C) (03/29 0540) Pulse Rate:  [86-103] 103 (03/29 0540) Resp:  [16-18] 18 (03/29 0540) BP: (102-116)/(47-58) 102/47 (03/29 0540) SpO2:  [94 %-100 %] 94 % (03/29 0925)   Physical Exam  Constitutional:  oriented to person, place, and time. appears well-developed and well-nourished. No distress.  HEENT: [place]/AT, PERRLA, no scleral icterus, oropharynx clear Cardiovascular: rrr no mrg   Pulmonary: Effort normal and breath sounds normal. No respiratory distress.  has no wheezes.  Neck suppleabd s/nt Neurological: alert and oriented to person, place, and time.  Skin: Skin is warm and dry. No erythema. Stage 2 decub right buttock laterally Msk: the incision of the right hip have skipped area of dehiscence; no obvious fluctuance; no discharge; no tenderness Psychiatric: a normal mood and affect.  behavior is normal.    Lab Results Lab Results  Component Value Date   WBC 5.0 07/13/2020   HGB 8.6 (L) 07/13/2020   HCT 28.6 (L) 07/13/2020   MCV 100.7 (H) 07/13/2020   PLT 29 (LL) 72/62/0355   Last metabolic panel Lab Results  Component Value Date   GLUCOSE 113 (H) 07/06/2020   NA 138 07/06/2020   K 3.8 07/06/2020   CL 105 07/06/2020   CO2 27 07/06/2020   BUN 8 07/06/2020   CREATININE 0.54 07/06/2020   GFRNONAA >60 07/06/2020   GFRAA >60 12/01/2019   CALCIUM 9.2 07/06/2020   PHOS 2.1 (L) 05/05/2020   PROT 5.8 (L) 07/06/2020   ALBUMIN 3.3 (L) 07/06/2020   LABGLOB 2.5 04/04/2019   AGRATIO 1.7 04/04/2019   BILITOT 2.5 (H) 07/06/2020   ALKPHOS 60 07/06/2020   AST 13 (L) 07/06/2020   ALT 10 07/06/2020   ANIONGAP 6 07/06/2020    C-Reactive Protein    Component Value Date/Time   CRP 10.6 (H) 07/06/2020 1400  CRP 30.0 (H) 05/02/2020 1435   CRP 1.4 (H) 12/23/2018 2209    Microbiology: Reviewed; none  Studies/Results: 07/07/20 ct right hip with contrast 1. Ununited comminuted right intertrochanteric fracture transfixed with a intramedullary nail and interlocking femoral neck screws and mild callus formation across the bone fragments. Persistent low-attenuation material around the fracture fragments may reflect residual hemorrhagic material, but infection cannot be excluded. With a soft tissue track extending towards the skin surface beyond the field of view. No drainable fluid collection. If there is persistent clinical concern regarding soft tissue  infection extending to the fracture site, an MRI of the right hip utilizing mars protocol may be helpful for characterization.  Assessment/Plan: Right hip intramedullary nail 05/13/5168, complicated by superficial wound/delayed wound healing -- s/p 4 weeks doxy  Admitted for thrombocytopenia, beign w/u'ed per oncology  Repeated crp so far improving off abx. Repeat ct no drainable fluid collection  --------- 3/29 assessment From other providers review and per patient, right hip appearance improving No further crp repeat since 3/22 I reviewed ct scan from 3/23 as well -- there is mention of a soft tissue tract extending from the hardware. I would recommend reviewing with surgery on this  While crp can be low, some cases this would miss a low virulent infection. But clinically one would still see things such as non-union of bone, loosening/failure of hardware, pain/swelling. It might be early to tell, but at least superficially seems to show improvement  I reviewed oncology note --> cmml-1 with chronic ITP and previous steroid. Platelet remains rather low (no lab since 3/22) despite ivig/nplate. Plan for outpatient weekly n-plate. Also plan for chemo for cmml  She is in a hardplace --> thrombocytopenia persistent despite maximal supportive care, needing likely chemo per oncology team. Clinical concern for deeper hardware infection given the soft tissue tract and still incompletely healed hip incision in setting previous superficial surgical site infection     -f/u id clinic as below -please seek ortho evaluation of imaging and hip -I agree as oncology plans chemo, more definitive dx of whether or not the hip prosthesis is infected.  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 I would also rather have definitive culture to guide targetted therapy -would continue to engage oncology/ortho to discuss diagnostic option -if no concensus is reached, and patient is discharged, she has follow up with outpatient ID to continue monitoring    -please reengage ID if potential I&D and culture is done this admission   Clinic Follow Up Appt: 4/13 with Dr CLinus Salmons @  [facility] [address] [place] [address]Phone: [phone] I spent more than 30 minute reviewing data/chart, and coordinating care and >50% direct face to face time providing counseling/discussing diagnostics/treatment plan with patient   TJabier Mutton MRicevillefor IWest [place] [phone] pager   [phone]

## 2020-07-13 NOTE — TOC Progression Note (Signed)
Transition of Care [facility]) - Progression Note    Patient Details  Name: Onika Gudiel MRN: [id number] Date of Birth: March 17, 1951  [facility]) CM/SW Contact  Joaquin Courts, RN Phone Number: 07/13/2020, 12:16 PM  Clinical Narrative:    Patient active with [facility] for [facility] services.  Agency rep notified of need to resume HHPT at discharge.    Expected Discharge Plan: [place] Barriers to Discharge: Continued Medical Work up  Expected Discharge Plan and Services Expected Discharge Plan: [place] Acute Care Choice: Resumption of Svcs/PTA Provider                             HH Arranged: PT [place]: [place] Date [place]: 07/13/20 Time HH Agency Contacted: 1215 Representative spoke with at [place]: [place] ([place]) Interventions    Readmission Risk Interventions Readmission Risk Prevention Plan 08/12/2019  Transportation Screening Complete  PCP or Specialist Appt within 3-5 Days Complete  HRI or [place] Complete  Social Work Consult for [place] Planning/Counseling Complete  Palliative Care Screening Not Applicable  Medication Review Press photographer) Complete  Some recent data might be hidden

## 2020-07-14 ENCOUNTER — Inpatient Hospital Stay (HOSPITAL_COMMUNITY): Payer: 59

## 2020-07-14 DIAGNOSIS — J679 Hypersensitivity pneumonitis due to unspecified organic dust: Secondary | ICD-10-CM | POA: Diagnosis not present

## 2020-07-14 DIAGNOSIS — D693 Immune thrombocytopenic purpura: Secondary | ICD-10-CM | POA: Diagnosis not present

## 2020-07-14 DIAGNOSIS — D649 Anemia, unspecified: Secondary | ICD-10-CM

## 2020-07-14 DIAGNOSIS — D696 Thrombocytopenia, unspecified: Secondary | ICD-10-CM | POA: Diagnosis not present

## 2020-07-14 DIAGNOSIS — C9310 Chronic myelomonocytic leukemia not having achieved remission: Secondary | ICD-10-CM | POA: Diagnosis not present

## 2020-07-14 DIAGNOSIS — R0902 Hypoxemia: Secondary | ICD-10-CM

## 2020-07-14 LAB — CBC
HCT: 26.9 % — ABNORMAL LOW (ref 36.0–46.0)
Hemoglobin: 8 g/dL — ABNORMAL LOW (ref 12.0–15.0)
MCH: 30.3 pg (ref 26.0–34.0)
MCHC: 29.7 g/dL — ABNORMAL LOW (ref 30.0–36.0)
MCV: 101.9 fL — ABNORMAL HIGH (ref 80.0–100.0)
Platelets: 37 10*3/uL — ABNORMAL LOW (ref 150–400)
RBC: 2.64 MIL/uL — ABNORMAL LOW (ref 3.87–5.11)
RDW: 22.1 % — ABNORMAL HIGH (ref 11.5–15.5)
WBC: 6.1 10*3/uL (ref 4.0–10.5)
nRBC: 7.2 % — ABNORMAL HIGH (ref 0.0–0.2)

## 2020-07-14 LAB — BRAIN NATRIURETIC PEPTIDE: B Natriuretic Peptide: 60.6 pg/mL (ref 0.0–100.0)

## 2020-07-14 LAB — GLUCOSE, CAPILLARY
Glucose-Capillary: 103 mg/dL — ABNORMAL HIGH (ref 70–99)
Glucose-Capillary: 86 mg/dL (ref 70–99)
Glucose-Capillary: 126 mg/dL — ABNORMAL HIGH (ref 70–99)
Glucose-Capillary: 135 mg/dL — ABNORMAL HIGH (ref 70–99)
Glucose-Capillary: 174 mg/dL — ABNORMAL HIGH (ref 70–99)

## 2020-07-14 MED ORDER — FUROSEMIDE 10 MG/ML IJ SOLN
20.0000 mg | Freq: Two times a day (BID) | INTRAMUSCULAR | Status: DC
  Administered 2020-07-14: 20 mg via INTRAVENOUS
  Filled 2020-07-14 (×2): qty 2

## 2020-07-14 MED ORDER — FUROSEMIDE 10 MG/ML IJ SOLN
20.0000 mg | Freq: Once | INTRAMUSCULAR | Status: AC
  Administered 2020-07-14: 20 mg via INTRAVENOUS
  Filled 2020-07-14: qty 2

## 2020-07-14 NOTE — Progress Notes (Signed)
   07/14/20 0158  Provider Notification  Provider Name/Title B. Chotiner  Date Provider Notified 07/14/20  Time Provider Notified 0158  Notification Type Page  Notification Reason Other (Comment) (pt requesting medication for gas trapped in the abdomen)  Provider response No new orders  Date of Provider Response 07/14/20  Time of Provider Response 0159  Patient complained of gas in the stomach area; RN tried Mylanta PRN to see if this helped relieve patient's discomfort and a ginger ale. Patient still complaining so MD contacted, no new orders from MD.

## 2020-07-14 NOTE — Progress Notes (Signed)
Pt Physical Therapy Treatment Patient Details Name: Kathy Irwin MRN: [id number] DOB: [birth year] Today's Date: 07/14/2020    History of Present Illness 70 y.o. female with medical history significant of chronic myelomonocytic leukemia, chronic diastolic heart failure, chronic anemia, diabetes mellitus type 2, hyperlipidemia, hypersensitivity pneumonitis, interstitial lung disease, GERD, thrombocytopenia. Recent hip fracture in january 2022.I & D for R hip infection postponed 2* thrombocytopenia.    PT Comments    Pt OOB in recliner via OT.  Pt on 3 lts at rest 99%.  Trial RA at rest 84% and with amb to bathroom 78% with 3/4 dyspnea and visible SOB.   Assisted with amb to and from bathroom.  General transfer comment: pt self able but with increased time and slow moving.  General Gait Details: only assisted to and from bathroo.  Limited by dyspnea 3/4 and trial RA sats decreased to 78% with activity. Also required an extended rest break while seated on toilet. Had to reapply 4 lts to achieve sats > 90%.  Unable to tolerate amb in hallway.  Returned to [employer].  Pt plans to return home but lives alone.  Pt will need some initial assist at home.    Follow Up Recommendations  Home health PT     Equipment Recommendations  None recommended by PT    Recommendations for Other Services       Precautions / Restrictions Precautions Precautions: Fall Precaution Comments: monitor sats    Mobility  Bed Mobility Overal bed mobility: Modified Independent             General bed mobility comments: OOB in recliner    Transfers Overall transfer level: Needs assistance Equipment used: Rolling walker (2 wheeled) Transfers: Sit to/from [facility] Sit to Stand: Supervision Stand pivot transfers: Supervision;Min guard       General transfer comment: pt self able but with increased time and slow moving  Ambulation/Gait Ambulation/Gait assistance: Supervision Gait  Distance (Feet): 20 Feet Assistive device: Rolling walker (2 wheeled) Gait Pattern/deviations: Step-to pattern;Decreased weight shift to right;Decreased step length - right;Decreased step length - left Gait velocity: decreased   General Gait Details: only assisted to and from bathroo.  Limited by dyspnea 3/4 and trial RA sats decreased to 78% with activity. Also required an extended rest break while seated on toilet. Had to reapply 4 lts to achieve sats > 90%.  Unable to tolerate amb in hallway.  Returned to [employer].   Stairs             Wheelchair Mobility    Modified Rankin (Stroke Patients Only)       Balance Overall balance assessment: Needs assistance Sitting-balance support: Feet supported Sitting balance-Leahy Scale: Good     Standing balance support: Bilateral upper extremity supported Standing balance-Leahy Scale: Poor                              Cognition Arousal/Alertness: Awake/alert Behavior During Therapy: WFL for tasks assessed/performed Overall Cognitive Status: Impaired/Different from baseline                                 General Comments: AxO x 3 but slow, sluggish, delayed and somewhat forgetful      Exercises      General Comments        Pertinent Vitals/Pain Pain Assessment: 0-10 Faces Pain Scale: Hurts a little bit  Pain Location: R hip Pain Descriptors / Indicators: Grimacing;Sore Pain Intervention(s): Monitored during session;Repositioned    Home Living                      Prior Function            PT Goals (current goals can now be found in the care plan section) Acute Rehab PT Goals Patient Stated Goal: DC home Progress towards PT goals: Progressing toward goals    Frequency    Min 3X/week      PT Plan Current plan remains appropriate    Co-evaluation              AM-PAC PT "6 Clicks" Mobility   Outcome Measure  Help needed turning from your back to your side while  in a flat bed without using bedrails?: None Help needed moving from lying on your back to sitting on the side of a flat bed without using bedrails?: None Help needed moving to and from a bed to a chair (including a wheelchair)?: None Help needed standing up from a chair using your arms (e.g., wheelchair or bedside chair)?: None Help needed to walk in hospital room?: A Little Help needed climbing 3-5 steps with a railing? : A Lot 6 Click Score: 21    End of Session Equipment Utilized During Treatment: Gait belt Activity Tolerance: Patient limited by fatigue Patient left: with call bell/phone within reach;with bed alarm set;in chair Nurse Communication: Mobility status PT Visit Diagnosis: Difficulty in walking, not elsewhere classified (R26.2);Pain Pain - Right/Left: Right Pain - part of body: Hip     Time: 1132-1200 PT Time Calculation (min) (ACUTE ONLY): 28 min  Charges:  $Gait Training: 8-22 mins $Therapeutic Activity: 8-22 mins                     Rica Koyanagi  PTA Acute  Rehabilitation Services Pager      [phone] Office      [phone]

## 2020-07-14 NOTE — Consult Note (Signed)
Orthopaedic Trauma Service (OTS) Consult   Patient ID: Kathy Irwin MRN: [id number] DOB/AGE: [birth year] 70 y.o.  Reason for Consult: Evaluation of previous right hip incisions Referring Physician: Dr. Jacki Cones, MD ([facility])  HPI: Kathy Irwin is an 70 y.o. female medical history significant of chronic myelomonocytic leukemia, chronic diastolic heart failure, chronic anemia, diabetes mellitus type 2, hyperlipidemia, hypersensitivity pneumonitis, interstitial lung disease, GERD, thrombocytopenia being seen in consultation at the request of Dr. Zigmund Daniel for re-evaluation for right hip incisions.  Patient was previously scheduled for the right hip incisions on 07/02/2020 after having 4+ weeks of drainage that did not resolve with on multiple courses of oral antibiotics.  On day of scheduled procedure, surgery was canceled due to significant thrombocytopenia.  Also at that time, her wounds had minimal to no draiange  She was discharged home with instructions to follow-up with orthopedics the following week.  Patient admitted to [facility] on 07/05/2020 from the oncology office due to anemia and thrombocytopenia.  OTS was consulted on 07/13/2020 for reevaluation of hip incisions.  Infectious disease has been involved and feels that wounds are stable.   Patient seen this morning and overall states she is doing well. She has been off of her doxycycline since previously scheduled surgery on 07/02/20 and has not had any increase in drainage.  Patient denies any fever or chills.  Past Medical History:  Diagnosis Date  . Acute respiratory failure with hypoxia ([place]) 12/2018  . Anemia, unspecified   . CML (chronic myelocytic leukemia) ([place])   . Diabetes mellitus without complication ([place])   . Esophageal reflux   . Hyperlipemia   . Hypersensitivity pneumonitis ([place]) 12/19/2017  . Hypertension   . ILD (interstitial lung disease) ([place]) 12/24/2018    Past Surgical History:  Procedure  Laterality Date  . ABDOMINAL HYSTERECTOMY    . INTRAMEDULLARY (IM) NAIL INTERTROCHANTERIC Right 04/30/2020   Procedure: INTRAMEDULLARY (IM) NAIL INTERTROCHANTRIC;  Surgeon: Shona Needles, MD;  Location: [place] [facility];  Service: Orthopedics;  Laterality: Right;  Marland Kitchen VIDEO BRONCHOSCOPY Bilateral 04/02/2019   Procedure: VIDEO BRONCHOSCOPY WITH FLUORO;  Surgeon: Marshell Garfinkel, MD;  Location: [facility];  Service: Cardiopulmonary;  Laterality: Bilateral;    Family History  Problem Relation Age of Onset  . Diabetes Other   . Hypertension Other     Social History:  reports that she has never smoked. She has never used smokeless tobacco. She reports that she does not drink alcohol and does not use drugs.  Allergies:  Allergies  Allergen Reactions  . Other Shortness Of Breath and Other (See Comments)    Newspaper ink =  new chest pain, also  . Ativan [Lorazepam] Other (See Comments)    Severe confusion and agitation.   . Iodine Other (See Comments)    Unknown reaction  . Iodine I 131 Tositumomab   . Merbromin Other (See Comments)    Unknown reaction - Mercurochrome- "Was a long time ago"   . Tape Rash    Medications:  Scheduled: . cholecalciferol  5,000 Units Oral Daily  . famotidine  40 mg Oral Daily  . insulin aspart  0-15 Units Subcutaneous TID WC  . linaclotide  145 mcg Oral QAC breakfast  . mometasone-formoterol  2 puff Inhalation BID  . montelukast  10 mg Oral QHS  . nystatin   Topical TID  . olopatadine  1 drop Both Eyes BID  . pantoprazole  40 mg Oral Daily  . pravastatin  40 mg Oral QHS  .  romiPLOStim  5 mcg/kg Subcutaneous Weekly  . sertraline  100 mg Oral QHS   Continuous: . sodium chloride 100 mL/hr at 07/13/20 2240   UTM:[id number] **OR** acetaminophen, albuterol, alum & mag hydroxide-simeth, guaiFENesin-dextromethorphan, hydrOXYzine, lip balm, ondansetron **OR** ondansetron (ZOFRAN) IV, polyethylene glycol, sodium chloride  ROS: Constitutional: No fever or  chills Vision: No changes in vision ENT: No difficulty swallowing CV: No chest pain Pulm: No SOB or wheezing GI: No nausea or vomiting GU: No urgency or inability to hold urine Skin: + healing incisions R hip Neurologic: No numbness or tingling Psychiatric: No depression or anxiety Heme: No bruising Allergic: No reaction to medications or food   Exam: Blood pressure (!) 115/57, pulse 86, temperature 98.2 F (36.8 C), temperature source Oral, resp. rate 15, height _0  (1.626 m), weight 108.9 kg, SpO2 97 %. General: Resting in bed comfortably, NAD Orientation: Alert and oreinted Mood and Affect: Mood and affect appropriate, pleasant and cooperaitve Gait: Not assessed Coordination and balance: Within normal limits  RLE: Dressing over posterior lateral hip pulled back, incision appears stable. No active drainage or any residual drainage noted on dressing placed 07/13/20. Lateral thigh incisions without cellulitis or drainage.  Mildly tender over hip with palpation, less tender throughout thigh. Motor and sensory function intact distally. Compartments soft and compressible.  +DP pulse  LLE: Skin without lesions. No tenderness to palpation. Full painless ROM. Motor and sensory function intact. Skin warm and dry. Neurovascularly intact  Medical Decision Making: Data: Imaging: CT right hip shows intertrochanteric/subtrochanteric fracture with mild interval callus formation. There is a soft tissue track extending towards the skin surface, but no drainable fluid collection.  Labs:  Results for orders placed or performed during the hospital encounter of 07/05/20 (from the past 24 hour(s))  Glucose, capillary     Status: None   Collection Time: 07/13/20 12:05 PM  Result Value Ref Range   Glucose-Capillary 97 70 - 99 mg/dL  Comprehensive metabolic panel     Status: Abnormal   Collection Time: 07/13/20 12:44 PM  Result Value Ref Range   Sodium 137 135 - 145 mmol/L   Potassium 4.0 3.5 -  5.1 mmol/L   Chloride 105 98 - 111 mmol/L   CO2 26 22 - 32 mmol/L   Glucose, Bld 91 70 - 99 mg/dL   BUN 18 8 - 23 mg/dL   Creatinine, Ser 0.62 0.44 - 1.00 mg/dL   Calcium 9.4 8.9 - 10.3 mg/dL   Total Protein 7.1 6.5 - 8.1 g/dL   Albumin 2.8 (L) 3.5 - 5.0 g/dL   AST 23 15 - 41 U/L   ALT 15 0 - 44 U/L   Alkaline Phosphatase 68 38 - 126 U/L   Total Bilirubin 1.7 (H) 0.3 - 1.2 mg/dL   GFR, Estimated >60 >60 mL/min   Anion gap 6 5 - 15  C-reactive protein     Status: Abnormal   Collection Time: 07/13/20 12:44 PM  Result Value Ref Range   CRP 10.0 (H) <1.0 mg/dL  Glucose, capillary     Status: None   Collection Time: 07/13/20  4:19 PM  Result Value Ref Range   Glucose-Capillary 74 70 - 99 mg/dL  Glucose, capillary     Status: Abnormal   Collection Time: 07/13/20  5:03 PM  Result Value Ref Range   Glucose-Capillary 100 (H) 70 - 99 mg/dL  Glucose, capillary     Status: Abnormal   Collection Time: 07/13/20  9:39 PM  Result Value Ref  Range   Glucose-Capillary 103 (H) 70 - 99 mg/dL  CBC     Status: Abnormal   Collection Time: 07/14/20  6:17 AM  Result Value Ref Range   WBC 6.1 4.0 - 10.5 K/uL   RBC 2.64 (L) 3.87 - 5.11 MIL/uL   Hemoglobin 8.0 (L) 12.0 - 15.0 g/dL   HCT 26.9 (L) 36.0 - 46.0 %   MCV 101.9 (H) 80.0 - 100.0 fL   MCH 30.3 26.0 - 34.0 pg   MCHC 29.7 (L) 30.0 - 36.0 g/dL   RDW 22.1 (H) 11.5 - 15.5 %   Platelets 37 (L) 150 - 400 K/uL   nRBC 7.2 (H) 0.0 - 0.2 %     Assessment/Plan: 70 year old female status post intramedullary nail right intertrochanteric/subtrochanteric femur fracture 04/30/2020  Patient's previous right hip infection sites are looking better and currently appear stable with no purulence, drainage, or swelling. No drainable fluid collection noted on CT scan. She has been off antibiotics with no increased drainage noted. After discussion with Dr. Doreatha Martin, it is felt that no formal irrigation and debridement is needed at this time. Agree with patient  remaining off antibiotics per ID recommendations with close follow up.  Additionally, orthopaedics would like platelets to be >50,000 prior to procedure if surgery were to be needed. We will continue to follow remotely while in hospital and arrange for patient to be seen in the outpatient setting 1-2 weeks after hospital discharge. Please call with any additional questions/concerns.   Sybil Shrader A. Carm[facility] [phone] (office) [email or web address]

## 2020-07-14 NOTE — Progress Notes (Signed)
Occupational Therapy Treatment Patient Details Name: Kathy Irwin MRN: [id number] DOB: 1950/07/28 Today's Date: 07/14/2020    History of present illness 70 y.o. female with medical history significant of chronic myelomonocytic leukemia, chronic diastolic heart failure, chronic anemia, diabetes mellitus type 2, hyperlipidemia, hypersensitivity pneumonitis, interstitial lung disease, GERD, thrombocytopenia. Recent hip fracture in january 2022.I & D for R hip infection postponed 2* thrombocytopenia.   OT comments  Patient received in bed, agreeable to getting washed up. Overall patient set up assistance for UB/LB bathing, patient preferring to perform lateral leans onto bed to wash peri area. Patient stood ~10 seconds to wash buttock, encourage patient to try and perform this in standing to build activity tolerance needed for additional ADLs when she is at home. Patient supervision level with rolling walker cues for safe hand placement to transfer onto toilet, patient does not reach behind her with poor eccentric control. Once seated in chair patient report dyspnea with exertion on 3L, recover within ~1 min seated rest break. Continue with POC.    Follow Up Recommendations  Home health OT;Supervision - Intermittent    Equipment Recommendations  None recommended by OT       Precautions / Restrictions Precautions Precautions: Fall Precaution Comments: monitor sats       Mobility Bed Mobility Overal bed mobility: Modified Independent             General bed mobility comments: used rail, increased time pt able    Transfers Overall transfer level: Needs assistance Equipment used: Rolling walker (2 wheeled) Transfers: Sit to/from Stand Sit to Stand: Supervision         General transfer comment: cues for safe hand placement    Balance Overall balance assessment: Needs assistance Sitting-balance support: Feet supported Sitting balance-Leahy Scale: Good     Standing balance  support: Bilateral upper extremity supported Standing balance-Leahy Scale: Poor                             ADL either performed or assessed with clinical judgement   ADL Overall ADL's : Needs assistance/impaired         Upper Body Bathing: Set up;Sitting Upper Body Bathing Details (indicate cue type and reason): to wash arms, chest and abdomen at edge of bed Lower Body Bathing: Set up;Supervison/ safety;Sitting/lateral leans;Sit to/from stand;Bed level Lower Body Bathing Details (indicate cue type and reason): patient stood briefly to wash peri area, then states she can do it recliend in bed. encouraged patient to perform standing activity to build endurance needed for ADLs such as toileting         Toilet Transfer: Supervision/safety;Ambulation;RW Toilet Transfer Details (indicate cue type and reason): does require increased time to power up to standing from EOB then toilet with cues for safe hand placement as patient pushes on walker and has poor eccentric control when sitting onto toilet Toileting- Clothing Manipulation and Hygiene: Set up;Sitting/lateral lean Toileting - Clothing Manipulation Details (indicate cue type and reason): for peri area after voiding on toilet     Functional mobility during ADLs: Supervision/safety;Rolling walker General ADL Comments: patient endorses shortness of breath after toilet transfer on 3L O2               Cognition Arousal/Alertness: Awake/alert Behavior During Therapy: WFL for tasks assessed/performed Overall Cognitive Status: Within Functional Limits for tasks assessed  Pertinent Vitals/ Pain       Pain Assessment: Faces Faces Pain Scale: Hurts a little bit Pain Location: R hip Pain Descriptors / Indicators: Grimacing Pain Intervention(s): Monitored during session         Frequency  Min 2X/week        Progress Toward Goals  OT  Goals(current goals can now be found in the care plan section)  Progress towards OT goals: Progressing toward goals  Acute Rehab OT Goals Patient Stated Goal: DC home OT Goal Formulation: With patient Time For Goal Achievement: 07/20/20 Potential to Achieve Goals: Good ADL Goals Pt Will Perform Grooming: with modified independence;standing;sitting Pt Will Perform Upper Body Dressing: Independently Pt Will Perform Lower Body Dressing: with modified independence;sit to/from stand;sitting/lateral leans Pt Will Transfer to Toilet: with modified independence;ambulating;grab bars;regular height toilet (rollator) Pt Will Perform Toileting - Clothing Manipulation and hygiene: with modified independence;sitting/lateral leans;sit to/from stand  Plan Discharge plan remains appropriate       AM-PAC OT "6 Clicks" Daily Activity     Outcome Measure   Help from another person eating meals?: None Help from another person taking care of personal grooming?: A Little Help from another person toileting, which includes using toliet, bedpan, or urinal?: A Little Help from another person bathing (including washing, rinsing, drying)?: A Little Help from another person to put on and taking off regular upper body clothing?: A Little Help from another person to put on and taking off regular lower body clothing?: A Little 6 Click Score: 19    End of Session Equipment Utilized During Treatment: Rolling walker;Oxygen  OT Visit Diagnosis: Unsteadiness on feet (R26.81);Pain Pain - Right/Left: Right Pain - part of body: Leg   Activity Tolerance Patient tolerated treatment well   Patient Left in chair;with call bell/phone within reach;with chair alarm set   Nurse Communication Mobility status        Time: 1003-1030 OT Time Calculation (min): 27 min  Charges: OT General Charges $OT Visit: 1 Visit OT Treatments $Self Care/Home Management : 23-37 mins  Delbert Phenix OT OT pager: [phone]   Rosemary Holms 07/14/2020, 11:26 AM

## 2020-07-14 NOTE — Progress Notes (Signed)
PROGRESS NOTE    Kathy Irwin  MRN:[id number] DOB: 05/16/1950 DOA: 07/05/2020 PCP: Edwards, Michelle P, NP    No chief complaint on file.   Brief Narrative:  Kathy Irwin a 70 y.o.femalewith medical history significant ofchronic myelomonocytic leukemia, chronic diastolic heart failure, chronic anemia, diabetes mellitus type 2, hyperlipidemia, hypersensitivity pneumonitis, interstitial lung disease, GERD, thrombocytopenia.Patient presented from oncology office for acute on chronic low platelets it was thought to be related to ITP.     Assessment & Plan:   Principal Problem:   Thrombocytopenia (HCC) Active Problems:   Hypersensitivity pneumonitis (HCC)   Obesity, Class III, BMI 40-49.9 (morbid obesity) (HCC)   Essential hypertension   Postoperative wound infection of right hip   Hyperbilirubinemia   Idiopathic thrombocytopenia (HCC)   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HCC)   Counseling regarding advance care planning and goals of care   Pyogenic arthritis of right hip (HCC)   1 ITP -Baseline platelets around 50-60,000. -Patient being followed by Dr. Kale in the outpatient setting. -On admission platelets noted to be at 17,000 from 44,000 days prior to admission. -Status post 2 doses of Nplate and IVIG platelets currently at 37,000. -Status post bone marrow biopsy 07/07/2020 with features consistent with previously known CMML-1. -Per oncology note lack of adequate response to IVIG and Nplate suggest thrombocytopenia is not completely related to ITP alone but could also be related to her CMML. -Per oncology platelet transfusion for platelets <10,000 or if acute bleeding. -Per oncology. -Follow.  2.  Right hip infection status post IM nail 04/30/2020 -Patient noted to have developed a postop infection of the right hip with plans for irrigation and debridement which was canceled due to significant thrombocytopenia and lack of drainage from right hip on day of  surgery. -Patient seen in consultation by ID while recommending to monitor off antibiotics. -CT right hip with no significant fluid collection. -ID reconsulted and patient seen by Dr. Vu 07/13/2020 who are recommending evaluation by surgery/orthopedic and review of films, and if no consensus is reached and patient is discharged outpatient follow-up with ID. -Patient seen by orthopedics, patient assessed by PA and it had discussions with Dr. Haddix, current recommendations are as no drainable fluid collection noted on CT scan, no increased drainage while being off antibiotics, orthopedics feels no formal irrigation and debridement needed at this time and I agree with patient remaining off antibiotics with close follow-up.  Per orthopedics additionally platelets will need to be > 50,000 prior to any procedure if surgery were to be needed. -Outpatient follow-up with orthopedics.  3.  Hypoxia/shortness of breath -Patient noted to be visibly short of breath. -Patient was working with PT this morning and noted to have sats of 78% on room air with activity with sats going greater than 90% on 4 L with patient inability to tolerate ambulation in the hallway. -Concern for possible volume overload.  Strict I's and O's have not been done appropriately.  Weights have not been recorded since admission. -Strict I's and O's, daily weights, check a chest x-ray, check a BNP.  Saline lock IV fluids. -Lasix 20 mg IV every 12 hours x4 doses. -Reassess in the a.m.  4.  Anemia -Status post transfusion 3 units packed red blood cells.  Hemoglobin currently stable at 8.0. -Transfusion threshold hemoglobin < 7.  5.  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 -Continue Dulera, Singulair, PPI. -Outpatient   follow-up.  6.  Diabetes mellitus type 2 -Patient noted to have hypoglycemic spells and as such Lantus  discontinued. -Hemoglobin A1c 6.7 (04/30/2020) -CBG 86 this morning. -Sliding scale insulin.  7.  Chronic myelomonocytic leukemia -Per oncology.  8.  Chronic diastolic heart failure/hypertension -Continue Toprol. -Patient noted to be hypoxic concerns for possible volume overload.  Currently off oral Lasix. -Saline lock IV fluids -Place on Lasix 20 mg IV every 12 hours x4 doses.  9.  Hyperlipidemia -Statin.  10.  Gastroesophageal reflux disease -PPI.  11.  Morbid obesity    DVT prophylaxis: SCDs Code Status: Full Family Communication: Updated patient.  No family at bedside. Disposition:   Status is: Inpatient    Dispo: The patient is from: Home              Anticipated d/c is to: Home with home health services              Patient currently being treated for thrombocytopenia, patient hypoxic and visibly short of breath, not stable for discharge.   Difficult to place patient no       Consultants:   Interventional radiology: Dr. Shelton Silvas 07/06/2020  Infectious disease: Dr. Baxter Flattery 07/06/2020  Orthopedics: Dr. Doreatha Martin 07/14/2020  ID: Dr. Gale Journey 07/13/2020  Procedures:   CT right hip/right lower extremity 07/07/2020  CT bone marrow biopsy 07/07/2020 --per IR, Dr. Annamaria Boots  Chest x-ray pending 07/14/2020  Transfusion 3 units packed red blood cells (3/21. 3/25).  Antimicrobials:  Anti-infectives (From admission, onward)   None        Subjective: Sitting up in chair.  Visibly short of breath.  Patient however denies any significant worsening shortness of breath from her baseline.  No chest pain.  Denies any overt bleeding.  Denies any significant drainage from right hip.  Overall states feeling better than on admission.  Objective: Vitals:   07/13/20 1934 07/13/20 2135 07/14/20 0537 07/14/20 0838  BP:  (!) 120/51 (!) 115/57   Pulse:  84 86   Resp:  16 15   Temp:  98.8 F (37.1 C) 98.2 F (36.8 C)   TempSrc:  Oral Oral   SpO2: 94% 97% 97% 97%  Weight:       Height:        Intake/Output Summary (Last 24 hours) at 07/14/2020 1136 Last data filed at 07/14/2020 1035 Gross per 24 hour  Intake 480 ml  Output 1000 ml  Net -520 ml   Filed Weights   07/05/20 2000  Weight: 108.9 kg    Examination:  General exam: Visibly looks shortness of breath. Respiratory system: Decreased breath sounds in the bases.  No crackles noted.  No wheezing.  Poor to fair air movement.  Cardiovascular system: S1 & S2 heard, RRR. No JVD, murmurs, rubs, gallops or clicks. No pedal edema. Gastrointestinal system: Abdomen is nondistended, soft and nontender. No organomegaly or masses felt. Normal bowel sounds heard. Central nervous system: Alert and oriented. No focal neurological deficits. Extremities: Dressing noted over posterior lateral right hip with incision site C/D/I, no drainage noted.   Skin: No rashes, lesions or ulcers Psychiatry: Judgement and insight appear normal. Mood & affect appropriate.     Data Reviewed: I have personally reviewed following labs and imaging studies  CBC: Recent Labs  Lab 07/08/20 1044 07/09/20 0554 07/10/20 0626 07/11/20 0530 07/12/20 0448 07/13/20 0534 07/14/20 0617  WBC 3.4*   < > 3.7* 3.8* 4.7 5.0 6.1  NEUTROABS 1.0*  --   --   --   --   --   --  HGB 8.1*   < > 8.9* 8.8* 8.2* 8.6* 8.0*  HCT 27.4*   < > 29.0* 28.9* 26.4* 28.6* 26.9*  MCV 103.0*   < > 101.4* 100.7* 98.9 100.7* 101.9*  PLT 20*   < > 19* 19* 20* 29* 37*   < > = values in this interval not displayed.    Basic Metabolic Panel: Recent Labs  Lab 07/13/20 1244  NA 137  K 4.0  CL 105  CO2 26  GLUCOSE 91  BUN 18  CREATININE 0.62  CALCIUM 9.4    GFR: Estimated Creatinine Clearance: 78.9 mL/min (by C-G formula based on SCr of 0.62 mg/dL).  Liver Function Tests: Recent Labs  Lab 07/13/20 1244  AST 23  ALT 15  ALKPHOS 68  BILITOT 1.7*  PROT 7.1  ALBUMIN 2.8*    CBG: Recent Labs  Lab 07/13/20 1205 07/13/20 1619 07/13/20 1703  07/13/20 2139 07/14/20 0745  GLUCAP 97 74 100* 103* 86     Recent Results (from the past 240 hour(s))  Resp Panel by RT-PCR (Flu A&B, Covid) Nasopharyngeal Swab     Status: None   Collection Time: 07/05/20  5:06 PM   Specimen: Nasopharyngeal Swab; Nasopharyngeal(NP) swabs in vial transport medium  Result Value Ref Range Status   SARS Coronavirus 2 by RT PCR NEGATIVE NEGATIVE Final    Comment: (NOTE) SARS-CoV-2 target nucleic acids are NOT DETECTED.  The SARS-CoV-2 RNA is generally detectable in upper respiratory specimens during the acute phase of infection. The lowest concentration of SARS-CoV-2 viral copies this assay can detect is 138 copies/mL. A negative result does not preclude SARS-Cov-2 infection and should not be used as the sole basis for treatment or other patient management decisions. A negative result may occur with  improper specimen collection/handling, submission of specimen other than nasopharyngeal swab, presence of viral mutation(s) within the areas targeted by this assay, and inadequate number of viral copies(<138 copies/mL). A negative result must be combined with clinical observations, patient history, and epidemiological information. The expected result is Negative.  Fact Sheet for Patients:  https://www.fda.gov/media/152166/download  Fact Sheet for Healthcare Providers:  https://www.fda.gov/media/152162/download  This test is no t yet approved or cleared by the [place] FDA and  has been authorized for detection and/or diagnosis of SARS-CoV-2 by FDA under an Emergency Use Authorization (EUA). This EUA will remain  in effect (meaning this test can be used) for the duration of the COVID-19 declaration under Section 564(b)(1) of the Act, 21 U.S.C.section 360bbb-3(b)(1), unless the authorization is terminated  or revoked sooner.       Influenza A by PCR NEGATIVE NEGATIVE Final   Influenza B by PCR NEGATIVE NEGATIVE Final    Comment: (NOTE) The  Xpert Xpress SARS-CoV-2/FLU/RSV plus assay is intended as an aid in the diagnosis of influenza from Nasopharyngeal swab specimens and should not be used as a sole basis for treatment. Nasal washings and aspirates are unacceptable for Xpert Xpress SARS-CoV-2/FLU/RSV testing.  Fact Sheet for Patients: https://www.fda.gov/media/152166/download  Fact Sheet for Healthcare Providers: https://www.fda.gov/media/152162/download  This test is not yet approved or cleared by the [place] FDA and has been authorized for detection and/or diagnosis of SARS-CoV-2 by FDA under an Emergency Use Authorization (EUA). This EUA will remain in effect (meaning this test can be used) for the duration of the COVID-19 declaration under Section 564(b)(1) of the Act, 21 U.S.C. section 360bbb-3(b)(1), unless the authorization is terminated or revoked.  Performed at [facility], [address] [place],   [place] [address]   Culture, blood (routine x 2)     Status: None   Collection Time: 07/06/20  1:47 PM   Specimen: BLOOD  Result Value Ref Range Status   Specimen Description   Final    BLOOD RIGHT ANTECUBITAL Performed at [facility] [address] [place], [place] [address]    Special Requests   Final    BOTTLES DRAWN AEROBIC ONLY Blood Culture adequate volume Performed at [facility] [address] [place], [place] [address]    Culture   Final    NO GROWTH 5 DAYS Performed at [facility], [place] [address] [place], [place] [address]    Report Status 07/11/2020 FINAL  Final  Culture, blood (routine x 2)     Status: None   Collection Time: 07/06/20  1:47 PM   Specimen: BLOOD RIGHT HAND  Result Value Ref Range Status   Specimen Description   Final    BLOOD RIGHT HAND Performed at [facility] [address], [place], [place] [address]    Special Requests   Final    BOTTLES DRAWN AEROBIC ONLY Blood Culture  adequate volume Performed at [facility] [address], [place], [place] [address]    Culture   Final    NO GROWTH 5 DAYS Performed at [facility], [place] [address], [place], [place] [address]    Report Status 07/11/2020 FINAL  Final         Radiology Studies: No results found.      Scheduled Meds: . cholecalciferol  5,000 Units Oral Daily  . famotidine  40 mg Oral Daily  . insulin aspart  0-15 Units Subcutaneous TID WC  . linaclotide  145 mcg Oral QAC breakfast  . mometasone-formoterol  2 puff Inhalation BID  . montelukast  10 mg Oral QHS  . nystatin   Topical TID  . olopatadine  1 drop Both Eyes BID  . pantoprazole  40 mg Oral Daily  . pravastatin  40 mg Oral QHS  . romiPLOStim  5 mcg/kg Subcutaneous Weekly  . sertraline  100 mg Oral QHS   Continuous Infusions: . sodium chloride 100 mL/hr at 07/13/20 2240     LOS: 7 days    Time spent: 40 minutes    [name], MD [facility]   To contact the attending provider between 7A-7P or the covering provider during after hours 7P-7A, please log into the web site [email or web address] and access using universal [place] password for that web site. If you do not have the password, please call the hospital operator.  07/14/2020, 11:36 AM

## 2020-07-15 ENCOUNTER — Telehealth: Payer: Self-pay | Admitting: Hematology

## 2020-07-15 ENCOUNTER — Encounter (HOSPITAL_COMMUNITY): Payer: Self-pay

## 2020-07-15 DIAGNOSIS — D696 Thrombocytopenia, unspecified: Secondary | ICD-10-CM | POA: Diagnosis not present

## 2020-07-15 DIAGNOSIS — I5033 Acute on chronic diastolic (congestive) heart failure: Secondary | ICD-10-CM

## 2020-07-15 DIAGNOSIS — J679 Hypersensitivity pneumonitis due to unspecified organic dust: Secondary | ICD-10-CM | POA: Diagnosis not present

## 2020-07-15 DIAGNOSIS — D693 Immune thrombocytopenic purpura: Secondary | ICD-10-CM | POA: Diagnosis not present

## 2020-07-15 DIAGNOSIS — C9310 Chronic myelomonocytic leukemia not having achieved remission: Secondary | ICD-10-CM | POA: Diagnosis not present

## 2020-07-15 LAB — BASIC METABOLIC PANEL
Anion gap: 7 (ref 5–15)
BUN: 11 mg/dL (ref 8–23)
CO2: 30 mmol/L (ref 22–32)
Calcium: 9 mg/dL (ref 8.9–10.3)
Chloride: 103 mmol/L (ref 98–111)
Creatinine, Ser: 0.53 mg/dL (ref 0.44–1.00)
GFR, Estimated: >60 mL/min (ref >60)
Glucose, Bld: 113 mg/dL — ABNORMAL HIGH (ref 70–99)
Potassium: 3.5 mmol/L (ref 3.5–5.1)
Sodium: 140 mmol/L (ref 135–145)

## 2020-07-15 LAB — GLUCOSE, CAPILLARY
Glucose-Capillary: 99 mg/dL (ref 70–99)
Glucose-Capillary: 104 mg/dL — ABNORMAL HIGH (ref 70–99)
Glucose-Capillary: 139 mg/dL — ABNORMAL HIGH (ref 70–99)
Glucose-Capillary: 122 mg/dL — ABNORMAL HIGH (ref 70–99)

## 2020-07-15 LAB — CBC
HCT: 26.4 % — ABNORMAL LOW (ref 36.0–46.0)
Hemoglobin: 7.9 g/dL — ABNORMAL LOW (ref 12.0–15.0)
MCH: 30 pg (ref 26.0–34.0)
MCHC: 29.9 g/dL — ABNORMAL LOW (ref 30.0–36.0)
MCV: 100.4 fL — ABNORMAL HIGH (ref 80.0–100.0)
Platelets: 45 10*3/uL — ABNORMAL LOW (ref 150–400)
RBC: 2.63 MIL/uL — ABNORMAL LOW (ref 3.87–5.11)
RDW: 21.7 % — ABNORMAL HIGH (ref 11.5–15.5)
WBC: 6.4 10*3/uL (ref 4.0–10.5)
nRBC: 6.4 % — ABNORMAL HIGH (ref 0.0–0.2)

## 2020-07-15 MED ORDER — METOPROLOL SUCCINATE ER 50 MG PO TB24
50.0000 mg | ORAL_TABLET | Freq: Every day | ORAL | Status: DC
  Administered 2020-07-15 – 2020-07-21 (×7): 50 mg via ORAL
  Filled 2020-07-15 (×7): qty 1

## 2020-07-15 MED ORDER — MELATONIN 3 MG PO TABS
3.0000 mg | ORAL_TABLET | Freq: Every evening | ORAL | Status: DC | PRN
  Administered 2020-07-15 – 2020-07-20 (×5): 3 mg via ORAL
  Filled 2020-07-15 (×5): qty 1

## 2020-07-15 MED ORDER — POTASSIUM CHLORIDE CRYS ER 20 MEQ PO TBCR
40.0000 meq | EXTENDED_RELEASE_TABLET | Freq: Once | ORAL | Status: AC
  Administered 2020-07-15: 40 meq via ORAL
  Filled 2020-07-15: qty 2

## 2020-07-15 MED ORDER — FUROSEMIDE 10 MG/ML IJ SOLN
20.0000 mg | Freq: Two times a day (BID) | INTRAMUSCULAR | Status: DC
  Administered 2020-07-15: 20 mg via INTRAVENOUS
  Filled 2020-07-15: qty 2

## 2020-07-15 MED ORDER — METOPROLOL SUCCINATE ER 25 MG PO TB24
25.0000 mg | ORAL_TABLET | Freq: Every day | ORAL | Status: DC
  Administered 2020-07-15 – 2020-07-21 (×7): 25 mg via ORAL
  Filled 2020-07-15 (×7): qty 1

## 2020-07-15 NOTE — Progress Notes (Addendum)
PROGRESS NOTE    Kathy Irwin  FXT:[id number] DOB: 10-Jun-1950 DOA: 07/05/2020 PCP: Kerin Perna, NP    No chief complaint on file.   Brief Narrative:  Kathy Irwin a 70 y.o.femalewith medical history significant ofchronic myelomonocytic leukemia, chronic diastolic heart failure, chronic anemia, diabetes mellitus type 2, hyperlipidemia, hypersensitivity pneumonitis, interstitial lung disease, GERD, thrombocytopenia.Patient presented from oncology office for acute on chronic low platelets it was thought to be related to ITP.     Assessment & Plan:   Principal Problem:   Thrombocytopenia ([place]) Active Problems:   Hypoxia   Hypersensitivity pneumonitis (HCC)   Obesity, Class III, BMI 40-49.9 (morbid obesity) ([place])   Essential hypertension   Postoperative wound infection of right hip   Hyperbilirubinemia   Idiopathic thrombocytopenia (HCC)   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[place])   Counseling regarding advance care planning and goals of care   Pyogenic arthritis of right hip (HCC)   Symptomatic anemia   1 ITP -Baseline platelets around 50-60,000. -Patient being followed by Dr. Irene Limbo in the outpatient setting. -On admission platelets noted to be at 17,000 from 44,000 days prior to admission. -Status post 2 doses of Nplate and IVIG platelets currently at 45,000. -Status post bone marrow biopsy 07/07/2020 with features consistent with previously known CMML-1. -Per oncology note lack of adequate response to IVIG and Nplate suggest thrombocytopenia is not completely related to ITP alone but could also be related to her CMML. -Per oncology platelet transfusion for platelets <10,000 or if acute bleeding. -Per oncology. -Follow.  2.  Right hip infection status post IM nail 04/30/2020 -Patient noted to have developed a postop infection of the right hip with plans for irrigation and debridement which was canceled due to significant thrombocytopenia and lack of  drainage from right hip on day of surgery. -Patient seen in consultation by ID while recommending to monitor off antibiotics. -CT right hip with no significant fluid collection. -ID reconsulted and patient seen by Dr. Gale Journey 07/13/2020 who are recommending evaluation by surgery/orthopedic and review of films, and if no consensus is reached and patient is discharged outpatient follow-up with ID. -Patient seen by orthopedics, patient assessed by PA and it had discussions with Dr. Doreatha Martin, current recommendations are as no drainable fluid collection noted on CT scan, no increased drainage while being off antibiotics, orthopedics feels no formal irrigation and debridement needed at this time and I agree with patient remaining off antibiotics with close follow-up.  Per orthopedics additionally platelets will need to be > 50,000 prior to any procedure if surgery were to be needed. -Outpatient follow-up with orthopedics.  3.  Hypoxia/shortness of breath secondary to acute diastolic CHF exacerbation -Patient noted to be visibly short of breath on 07/14/2020.. -Patient was working with PT and noted to have sats of 78% on room air with activity with sats going greater than 90% on 4 L with patient inability to tolerate ambulation in the hallway. -Concern for possible volume overload.  -Chest x-ray done with widening of upper mediastinum, bilateral interstitial prominence concern for interstitial edema and/or pneumonitis.   -Two-view chest x-ray obtained with tortuous thoracic aorta, mild background interstitial coarsening and slight patchy opacity in the lung bases may be atelectasis or pulmonary edema.   -Patient with significant clinical improvement with diuresis.   -Patient with urine output of 2.1 L over the past 24 hours.   -Continue Lasix 20 mg IV every 12 hours.   -Strict I's and O's.  Daily weights.    4.  Anemia -  Status post transfusion 3 units packed red blood cells.  Hemoglobin currently stable at  7.9. -Transfusion threshold hemoglobin < 7.  5.  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 -Continue Dulera, Singulair, PPI. -Outpatient follow-up.  6.  Diabetes mellitus type 2 -Patient noted to have hypoglycemic spells and as such Lantus discontinued. -Hemoglobin A1c 6.7 (04/30/2020) -CBG 99 this morning.   -SSI.   7.  Chronic myelomonocytic leukemia -Per oncology.  8.  Acute on chronic diastolic heart failure/hypertension -Patient noted to be visibly short of breath yesterday with concerns for volume overload.   -Patient noted have to have been off home regimen of oral diuretics.   -Patient placed on IV Lasix with clinical improvement.  -Urine output of 2.1 L over the past 24 hours.  -Weight not properly recorded.  -Patient is -7.6 L during this hospitalization.  -Continue Lasix 20 mg IV every 12 hours.  -Resume home regimen Toprol-XL.  -Strict I's and O's.  Daily weights  9.  Hyperlipidemia -Continue statin.    10.  Gastroesophageal reflux disease -Continue PPI.  11.  Morbid obesity    DVT prophylaxis: SCDs Code Status: Full Family Communication: Updated patient.  No family at bedside. Disposition:   Status is: Inpatient    Dispo: The patient is from: Home              Anticipated d/c is to: Home with home health services              Patient currently being treated for thrombocytopenia, volume overload.  Not stable for discharge.    Difficult to place patient no       Consultants:   Interventional radiology: Dr. Shelton Silvas 07/06/2020  Infectious disease: Dr. Baxter Flattery 07/06/2020  Orthopedics: Dr. Doreatha Martin 07/14/2020  ID: Dr. Gale Journey 07/13/2020  Procedures:   CT right hip/right lower extremity 07/07/2020  CT bone marrow biopsy 07/07/2020 --per IR, Dr. Annamaria Boots  Chest x-ray pending 07/14/2020  Transfusion 3 units packed red blood cells (3/21.  3/25).  Antimicrobials:  Anti-infectives (From admission, onward)   None       Subjective: Laying in bed currently receiving IV Lasix.  States that her shortness of breath is significantly better from yesterday.  Denies any chest pain.  Denies any bleeding.  Denies any significant drainage from hip.   Objective: Vitals:   07/14/20 1427 07/14/20 1941 07/14/20 2205 07/15/20 0524  BP: (!) 138/59  124/63 129/66  Pulse: 94  100 97  Resp: _0 Temp: 97.6 F (36.4 C)  97.9 F (36.6 C) 97.9 F (36.6 C)  TempSrc: Oral  Oral Oral  SpO2: 95% 96% 97% 98%  Weight:    104.8 kg  Height:        Intake/Output Summary (Last 24 hours) at 07/15/2020 1056 Last data filed at 07/15/2020 0252 Gross per 24 hour  Intake 365 ml  Output 2100 ml  Net -1735 ml   Filed Weights   07/05/20 2000 07/15/20 0524  Weight: 108.9 kg 104.8 kg    Examination:  General exam: NAD Respiratory system: Decreased breath sounds in the bases.  Fair air movement.  No wheezing.  Speaking in full sentences.  Less short of breath today visibly.   Cardiovascular system: Regular rate rhythm.  Positive JVD.  No murmurs rubs or gallops.  Trace to 1+ bilateral lower extremity edema.  Gastrointestinal system: Abdomen  is soft, nontender, nondistended, positive bowel sounds.  No rebound.  No guarding. Central nervous system: Alert and oriented. No focal neurological deficits. Extremities: Dressing noted over posterior lateral right hip with incision site C/D/I, no drainage noted.   Skin: No rashes, lesions or ulcers Psychiatry: Judgement and insight appear normal. Mood & affect appropriate.     Data Reviewed: I have personally reviewed following labs and imaging studies  CBC: Recent Labs  Lab 07/11/20 0530 07/12/20 0448 07/13/20 0534 07/14/20 0617 07/15/20 0535  WBC 3.8* 4.7 5.0 6.1 6.4  HGB 8.8* 8.2* 8.6* 8.0* 7.9*  HCT 28.9* 26.4* 28.6* 26.9* 26.4*  MCV 100.7* 98.9 100.7* 101.9* 100.4*  PLT 19* 20* 29*  37* 45*    Basic Metabolic Panel: Recent Labs  Lab 07/13/20 1244 07/15/20 0535  NA 137 140  K 4.0 3.5  CL 105 103  CO2 26 30  GLUCOSE 91 113*  BUN 18 11  CREATININE 0.62 0.53  CALCIUM 9.4 9.0    GFR: Estimated Creatinine Clearance: 77.2 mL/min (by C-G formula based on SCr of 0.53 mg/dL).  Liver Function Tests: Recent Labs  Lab 07/13/20 1244  AST 23  ALT 15  ALKPHOS 68  BILITOT 1.7*  PROT 7.1  ALBUMIN 2.8*    CBG: Recent Labs  Lab 07/14/20 0745 07/14/20 1207 07/14/20 1732 07/14/20 2208 07/15/20 0726  GLUCAP 86 126* 135* 174* 99     Recent Results (from the past 240 hour(s))  Resp Panel by RT-PCR (Flu A&B, Covid) Nasopharyngeal Swab     Status: None   Collection Time: 07/05/20  5:06 PM   Specimen: Nasopharyngeal Swab; Nasopharyngeal(NP) swabs in vial transport medium  Result Value Ref Range Status   SARS Coronavirus 2 by RT PCR NEGATIVE NEGATIVE Final    Comment: (NOTE) SARS-CoV-2 target nucleic acids are NOT DETECTED.  The SARS-CoV-2 RNA is generally detectable in upper respiratory specimens during the acute phase of infection. The lowest concentration of SARS-CoV-2 viral copies this assay can detect is 138 copies/mL. A negative result does not preclude SARS-Cov-2 infection and should not be used as the sole basis for treatment or other patient management decisions. A negative result may occur with  improper specimen collection/handling, submission of specimen other than nasopharyngeal swab, presence of viral mutation(s) within the areas targeted by this assay, and inadequate number of viral copies(<138 copies/mL). A negative result must be combined with clinical observations, patient history, and epidemiological information. The expected result is Negative.  Fact Sheet for Patients:  EntrepreneurPulse.com.au  Fact Sheet for Healthcare Providers:  IncredibleEmployment.be  This test is no t yet approved or  cleared by the [place] FDA and  has been authorized for detection and/or diagnosis of SARS-CoV-2 by FDA under an Emergency Use Authorization (EUA). This EUA will remain  in effect (meaning this test can be used) for the duration of the COVID-19 declaration under Section 564(b)(1) of the Act, 21 U.S.C.section 360bbb-3(b)(1), unless the authorization is terminated  or revoked sooner.       Influenza A by PCR NEGATIVE NEGATIVE Final   Influenza B by PCR NEGATIVE NEGATIVE Final    Comment: (NOTE) The Xpert Xpress SARS-CoV-2/FLU/RSV plus assay is intended as an aid in the diagnosis of influenza from Nasopharyngeal swab specimens and should not be used as a sole basis for treatment. Nasal washings and aspirates are unacceptable for Xpert Xpress SARS-CoV-2/FLU/RSV testing.  Fact Sheet for Patients: EntrepreneurPulse.com.au  Fact Sheet for Healthcare Providers: IncredibleEmployment.be  This test is not yet  approved or cleared by the [place] and has been authorized for detection and/or diagnosis of SARS-CoV-2 by FDA under an Emergency Use Authorization (EUA). This EUA will remain in effect (meaning this test can be used) for the duration of the COVID-19 declaration under Section 564(b)(1) of the Act, 21 U.S.C. section 360bbb-3(b)(1), unless the authorization is terminated or revoked.  Performed at [facility], [place] [address], [place], [place] [address]   Culture, blood (routine x 2)     Status: None   Collection Time: 07/06/20  1:47 PM   Specimen: BLOOD  Result Value Ref Range Status   Specimen Description   Final    BLOOD RIGHT ANTECUBITAL Performed at [facility] [address] [place], [place] [address]    Special Requests   Final    BOTTLES DRAWN AEROBIC ONLY Blood Culture adequate volume Performed at [facility] [address], [place], [place] [address]     Culture   Final    NO GROWTH 5 DAYS Performed at [facility], [place] [address], [place], [place] [address]    Report Status 07/11/2020 FINAL  Final  Culture, blood (routine x 2)     Status: None   Collection Time: 07/06/20  1:47 PM   Specimen: BLOOD RIGHT HAND  Result Value Ref Range Status   Specimen Description   Final    BLOOD RIGHT HAND Performed at [facility] [address], [place], [place] [address]    Special Requests   Final    BOTTLES DRAWN AEROBIC ONLY Blood Culture adequate volume Performed at [facility] [address], [place], [place] [address]    Culture   Final    NO GROWTH 5 DAYS Performed at [facility], [place] [address], [place], [place] [address]    Report Status 07/11/2020 FINAL  Final         Radiology Studies: DG Chest 2 View  Result Date: 07/14/2020 CLINICAL DATA:  Concern for mediastinal widening on chest x-ray obtained earlier today. Hypoxia. EXAM: CHEST - 2 VIEW COMPARISON:  Portable AP view earlier today.  Chest CT 05/02/2020. FINDINGS: Borderline cardiomegaly. Mediastinal widening on prior exam is related to aortic tortuosity. Similar mediastinal appearance seen on prior exams. Mild background interstitial coarsening, with slight patchy opacity at the lung bases. No pneumothorax or significant pleural effusion. No acute osseous abnormalities are seen. IMPRESSION: 1. The questioned mediastinal widening on prior exam is related to tortuous thoracic aorta. 2. Mild background interstitial coarsening with slight patchy opacity at the lung bases, may be atelectasis or pulmonary edema. There may be underlying interstitial lung disease, that is not well assessed on the current exam. Electronically Signed   By: Keith Rake M.D.   On: 07/14/2020 19:11   DG CHEST PORT 1 VIEW  Result Date: 07/14/2020 CLINICAL DATA:  Hypoxia. EXAM: PORTABLE CHEST 1 VIEW COMPARISON:  Chest x-ray 05/10/2020. FINDINGS: One Ng  of the upper mediastinum. Although this may be related to prominent great vessels and portable technique, PA and lateral chest x-ray suggested for further evaluation. If mediastinal prominence persist on PA lateral chest x-ray, contrast-enhanced chest CT suggested to further evaluate. Heart size stable. Bilateral interstitial prominence. Interstitial edema and/or pneumonitis could present in this fashion. No pleural effusion or pneumothorax. IMPRESSION: 1. Widening of the upper mediastinum. Although this may be related to prominent great vessels and portable technique, PA and lateral chest x-ray suggested for  further evaluation. If mediastinal prominence persists on PA and lateral chest x-ray, contrast-enhanced chest CT suggested to further evaluate. 2. Heart size stable. Bilateral interstitial prominence. Interstitial edema and/or pneumonitis could present in this fashion. Electronically Signed   By: Marcello Moores  Register   On: 07/14/2020 15:18        Scheduled Meds: . cholecalciferol  5,000 Units Oral Daily  . famotidine  40 mg Oral Daily  . furosemide  20 mg Intravenous Q12H  . insulin aspart  0-15 Units Subcutaneous TID WC  . linaclotide  145 mcg Oral QAC breakfast  . mometasone-formoterol  2 puff Inhalation BID  . montelukast  10 mg Oral QHS  . nystatin   Topical TID  . olopatadine  1 drop Both Eyes BID  . pantoprazole  40 mg Oral Daily  . potassium chloride  40 mEq Oral Once  . pravastatin  40 mg Oral QHS  . romiPLOStim  5 mcg/kg Subcutaneous Weekly  . sertraline  100 mg Oral QHS   Continuous Infusions:    LOS: 8 days    Time spent: 40 minutes    [name], MD [facility]   To contact the attending provider between 7A-7P or the covering provider during after hours 7P-7A, please log into the web site [email or web address] and access using universal [place] password for that web site. If you do not have the password, please call the hospital operator.  07/15/2020, 10:56  AM

## 2020-07-15 NOTE — Care Management Important Message (Signed)
Important Message  Patient Details IM Letter given to the Patient. Name: Kathy Irwin MRN: [id number] Date of Birth: April 03, 1951   Medicare Important Message Given:  Yes     Kerin Salen 07/15/2020, 10:26 AM

## 2020-07-15 NOTE — Telephone Encounter (Signed)
Scheduled appts per 3/31 sch msg. Called pt, no answer. Left msg with appts dates and times.

## 2020-07-15 NOTE — Progress Notes (Signed)
Brief oncology note:  CBC from today has been reviewed which shows a platelet count up to 45,000 and hemoglobin of 7.9.  CBC    Component Value Date/Time   WBC 6.4 07/15/2020 0535   RBC 2.63 (L) 07/15/2020 0535   HGB 7.9 (L) 07/15/2020 0535   HGB 7.9 (L) 07/05/2020 1442   HGB 11.2 12/30/2019 1159   HCT 26.4 (L) 07/15/2020 0535   HCT 35.3 12/30/2019 1159   PLT 45 (L) 07/15/2020 0535   PLT 17 (L) 07/05/2020 1442   PLT 45 (LL) 12/30/2019 1159   MCV 100.4 (H) 07/15/2020 0535   MCV 97 12/30/2019 1159   MCH 30.0 07/15/2020 0535   MCHC 29.9 (L) 07/15/2020 0535   RDW 21.7 (H) 07/15/2020 0535   RDW 16.5 (H) 12/30/2019 1159   LYMPHSABS 1.4 07/08/2020 1044   LYMPHSABS 3.0 12/30/2019 1159   MONOABS 0.8 07/08/2020 1044   EOSABS 0.0 07/08/2020 1044   EOSABS 0.0 12/30/2019 1159   BASOSABS 0.0 07/08/2020 1044   BASOSABS 0.0 12/30/2019 1159    Notes from infectious disease and orthopedics have been reviewed.  ID does not recommend any antibiotics and she has follow-up as an outpatient with them on 4/13.  Orthopedics did not feel there was any fluid to drain and agree with keeping the patient off antibiotics.  They plan to follow-up with the patient 1 to 2 weeks after discharge.  From our standpoint, we will arrange for a weekly lab and Nplate injection in our office with a follow-up visit with Dr. Irene Limbo the week of 08/09/2020 to discuss systemic treatment.  I have sent a scheduling message to our office to arrange for these appointments.  The patient may be discharged home once medically stable with outpatient follow-up from our standpoint.

## 2020-07-16 ENCOUNTER — Other Ambulatory Visit: Payer: Self-pay

## 2020-07-16 DIAGNOSIS — C9310 Chronic myelomonocytic leukemia not having achieved remission: Secondary | ICD-10-CM | POA: Diagnosis not present

## 2020-07-16 DIAGNOSIS — J679 Hypersensitivity pneumonitis due to unspecified organic dust: Secondary | ICD-10-CM | POA: Diagnosis not present

## 2020-07-16 DIAGNOSIS — D693 Immune thrombocytopenic purpura: Secondary | ICD-10-CM

## 2020-07-16 DIAGNOSIS — D696 Thrombocytopenia, unspecified: Secondary | ICD-10-CM | POA: Diagnosis not present

## 2020-07-16 LAB — BASIC METABOLIC PANEL
Anion gap: 7 (ref 5–15)
BUN: 11 mg/dL (ref 8–23)
CO2: 27 mmol/L (ref 22–32)
Calcium: 9.2 mg/dL (ref 8.9–10.3)
Chloride: 103 mmol/L (ref 98–111)
Creatinine, Ser: 0.47 mg/dL (ref 0.44–1.00)
GFR, Estimated: >60 mL/min (ref >60)
Glucose, Bld: 105 mg/dL — ABNORMAL HIGH (ref 70–99)
Potassium: 3.7 mmol/L (ref 3.5–5.1)
Sodium: 137 mmol/L (ref 135–145)

## 2020-07-16 LAB — CBC WITH DIFFERENTIAL/PLATELET
Abs Immature Granulocytes: 0.4 10*3/uL — ABNORMAL HIGH (ref 0.00–0.07)
Basophils Absolute: 0 10*3/uL (ref 0.0–0.1)
Basophils Relative: 0 %
Eosinophils Absolute: 0 10*3/uL (ref 0.0–0.5)
Eosinophils Relative: 0 %
HCT: 25.9 % — ABNORMAL LOW (ref 36.0–46.0)
Hemoglobin: 7.9 g/dL — ABNORMAL LOW (ref 12.0–15.0)
Immature Granulocytes: 6 %
Lymphocytes Relative: 35 %
Lymphs Abs: 2.3 10*3/uL (ref 0.7–4.0)
MCH: 30.4 pg (ref 26.0–34.0)
MCHC: 30.5 g/dL (ref 30.0–36.0)
MCV: 99.6 fL (ref 80.0–100.0)
Monocytes Absolute: 2.9 10*3/uL — ABNORMAL HIGH (ref 0.1–1.0)
Monocytes Relative: 44 %
Neutro Abs: 1 10*3/uL — ABNORMAL LOW (ref 1.7–7.7)
Neutrophils Relative %: 15 %
Platelets: 41 10*3/uL — ABNORMAL LOW (ref 150–400)
RBC: 2.6 MIL/uL — ABNORMAL LOW (ref 3.87–5.11)
RDW: 21.6 % — ABNORMAL HIGH (ref 11.5–15.5)
WBC: 6.6 10*3/uL (ref 4.0–10.5)
nRBC: 7.2 % — ABNORMAL HIGH (ref 0.0–0.2)

## 2020-07-16 LAB — GLUCOSE, CAPILLARY
Glucose-Capillary: 100 mg/dL — ABNORMAL HIGH (ref 70–99)
Glucose-Capillary: 147 mg/dL — ABNORMAL HIGH (ref 70–99)
Glucose-Capillary: 95 mg/dL (ref 70–99)
Glucose-Capillary: 146 mg/dL — ABNORMAL HIGH (ref 70–99)

## 2020-07-16 LAB — MAGNESIUM: Magnesium: 1.6 mg/dL — ABNORMAL LOW (ref 1.7–2.4)

## 2020-07-16 MED ORDER — BISACODYL 5 MG PO TBEC
5.0000 mg | DELAYED_RELEASE_TABLET | Freq: Every day | ORAL | Status: AC | PRN
  Administered 2020-07-16 – 2020-07-18 (×2): 10 mg via ORAL
  Filled 2020-07-16 (×2): qty 2

## 2020-07-16 MED ORDER — MAGNESIUM SULFATE 4 GM/100ML IV SOLN
4.0000 g | Freq: Once | INTRAVENOUS | Status: AC
  Administered 2020-07-16: 4 g via INTRAVENOUS
  Filled 2020-07-16: qty 100

## 2020-07-16 MED ORDER — LEVALBUTEROL HCL 0.63 MG/3ML IN NEBU: 0.6300 mg | INHALATION_SOLUTION | Freq: Four times a day (QID) | RESPIRATORY_TRACT | Status: DC | PRN

## 2020-07-16 MED ORDER — FUROSEMIDE 10 MG/ML IJ SOLN
40.0000 mg | Freq: Two times a day (BID) | INTRAMUSCULAR | Status: DC
  Administered 2020-07-16 – 2020-07-18 (×5): 40 mg via INTRAVENOUS
  Filled 2020-07-16 (×5): qty 4

## 2020-07-16 MED ORDER — SORBITOL 70 % SOLN
30.0000 mL | Status: AC
  Administered 2020-07-16: 30 mL via ORAL
  Filled 2020-07-16: qty 30

## 2020-07-16 NOTE — Progress Notes (Addendum)
Occupational Therapy Treatment Patient Details Name: Cheila Wickstrom MRN: [id number] DOB: 02-Aug-1950 Today's Date: 07/16/2020    History of present illness 70 y.o. female with medical history significant of chronic myelomonocytic leukemia, chronic diastolic heart failure, chronic anemia, diabetes mellitus type 2, hyperlipidemia, hypersensitivity pneumonitis, interstitial lung disease, GERD, thrombocytopenia. Recent hip fracture in january 2022.I & D for R hip infection postponed 2* thrombocytopenia.   OT comments  Patient found in bed and reports being wet from urine secondary to purewick. Patient exhibiting dyspnea on 3 L [place] - reports feeling short of breath after breathing treatment. Patient supervision to transfer to side of bed needing increased time and bed rail. Patient unable to rise from normal bed height despite 3 attempts. Bed elevated and patient able to stand with min guard. Patient able to stand approx 1 minute - for LB bathing - therapist washed thighs, periarea and buttocks as patient reports she can't do it and leaning heavily on walker. Patient min guard to take steps to recliner. Patient's O2 sat 89-90% on 3L [place] and HR 127. Patient performed UB bathing and grooming seated in recliner. HR after grooming task 120 but dyspnea appeared to be improved. Therapist discussed POC with patient in regards to return home. Patient reports living alone but having people set up to come and assist. Patient exhibiting poor activity tolerance today. Therapist is tentatively continuing to recommend return home with [facility], as patient not agreeable to SNF, but upgrading recommendations to at least 24/7 assist initially.     Follow Up Recommendations  Home health OT;Supervision/Assistance - 24 hour    Equipment Recommendations  None recommended by OT    Recommendations for Other Services      Precautions / Restrictions Precautions Precautions: Fall Precaution Comments: monitor sats,  HR Restrictions Weight Bearing Restrictions: No       Mobility Bed Mobility Overal bed mobility: Needs Assistance Bed Mobility: Supine to Sit     Supine to sit: Supervision     General bed mobility comments: supervision to transfer to side of bed, increased time, use of bed rail.    Transfers Overall transfer level: Needs assistance Equipment used: Rolling walker (2 wheeled) Transfers: Sit to/from [facility] Sit to Stand: Min guard;From elevated surface Stand pivot transfers: Min guard       General transfer comment: Unable to rise from normal bed height. Min guard with RW from elevated bed height. Patient exhibited dyspnea at rest on 3 L [place]. Patient transferred to recliner with min guard and RW. o2 sat 89-90% and HR 127. Work of breathing improved with sitting rest break, sat 98% but HR maintained at 120.    Balance Overall balance assessment: Mild deficits observed, not formally tested Sitting-balance support: No upper extremity supported Sitting balance-Leahy Scale: Fair     Standing balance support: Bilateral upper extremity supported Standing balance-Leahy Scale: Poor                             ADL either performed or assessed with clinical judgement   ADL Overall ADL's : Needs assistance/impaired     Grooming: Set up;Sitting;Wash/dry face;Wash/dry hands   Upper Body Bathing: Set up;Sitting Upper Body Bathing Details (indicate cue type and reason): able to perform upper body bathing with set up Lower Body Bathing: Moderate assistance;Sit to/from stand Lower Body Bathing Details (indicate cue type and reason): mod assist for washing periarea and buttocks in standing - patient holding onto walker.  When asked if she could do it she said no. Reports having assistance with bathing at home from children. Upper Body Dressing : Set up;Sitting Upper Body Dressing Details (indicate cue type and reason): to don hospital gown.                          Vision Patient Visual Report: No change from baseline     Perception     Praxis      Cognition Arousal/Alertness: Awake/alert Behavior During Therapy: WFL for tasks assessed/performed Overall Cognitive Status: Within Functional Limits for tasks assessed                                          Exercises     Shoulder Instructions       General Comments      Pertinent Vitals/ Pain       Pain Assessment: No/denies pain  Home Living                                          Prior Functioning/Environment              Frequency  Min 2X/week        Progress Toward Goals  OT Goals(current goals can now be found in the care plan section)  Progress towards OT goals: Not progressing toward goals - comment (limited by HR and dyspnea today)  Acute Rehab OT Goals Patient Stated Goal: DC home OT Goal Formulation: With patient Time For Goal Achievement: 07/20/20 Potential to Achieve Goals: Good  Plan Discharge plan needs to be updated    Co-evaluation          OT goals addressed during session: ADL's and self-care      AM-PAC OT "6 Clicks" Daily Activity     Outcome Measure   Help from another person eating meals?: None Help from another person taking care of personal grooming?: A Little Help from another person toileting, which includes using toliet, bedpan, or urinal?: A Little Help from another person bathing (including washing, rinsing, drying)?: A Lot Help from another person to put on and taking off regular upper body clothing?: A Little Help from another person to put on and taking off regular lower body clothing?: A Little 6 Click Score: 18    End of Session Equipment Utilized During Treatment: Rolling walker;Oxygen  OT Visit Diagnosis: Unsteadiness on feet (R26.81);Pain   Activity Tolerance Patient limited by fatigue   Patient Left in chair;with call bell/phone within reach   Nurse  Communication Mobility status        Time: 5670-1410 OT Time Calculation (min): 25 min  Charges: OT General Charges $OT Visit: 1 Visit OT Treatments $Self Care/Home Management : 23-37 mins  Cledith Kamiya, OTR/L [place]  Office [phone] Pager: [place] 07/16/2020, 9:54 AM

## 2020-07-16 NOTE — Progress Notes (Addendum)
PROGRESS NOTE    Kathy Irwin  YHC:[id number] DOB: 01/16/1951 DOA: 07/05/2020 PCP: Kerin Perna, NP    No chief complaint on file.   Brief Narrative:  Kathy Irwin a 70 y.o.femalewith medical history significant ofchronic myelomonocytic leukemia, chronic diastolic heart failure, chronic anemia, diabetes mellitus type 2, hyperlipidemia, hypersensitivity pneumonitis, interstitial lung disease, GERD, thrombocytopenia.Patient presented from oncology office for acute on chronic low platelets it was thought to be related to ITP.     Assessment & Plan:   Principal Problem:   Thrombocytopenia ([place]) Active Problems:   Hypoxia   Hypersensitivity pneumonitis (HCC)   Obesity, Class III, BMI 40-49.9 (morbid obesity) ([place])   Essential hypertension   Postoperative wound infection of right hip   Hyperbilirubinemia   Idiopathic thrombocytopenia (HCC)   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[place])   Counseling regarding advance care planning and goals of care   Pyogenic arthritis of right hip (HCC)   Symptomatic anemia   1 ITP -Baseline platelets around 50-60,000. -Patient being followed by Dr. Irene Limbo in the outpatient setting. -On admission platelets noted to be at 17,000 from 44,000 days prior to admission. -Status post 2 doses of Nplate and IVIG platelets currently at 41,000. -Status post bone marrow biopsy 07/07/2020 with features consistent with previously known CMML-1. -Per oncology note lack of adequate response to IVIG and Nplate suggest thrombocytopenia is not completely related to ITP alone but could also be related to her CMML. -Per oncology platelet transfusion for platelets <10,000 or if acute bleeding. -Per oncology. -Follow.  2.  Right hip infection status post IM nail 04/30/2020 -Patient noted to have developed a postop infection of the right hip with plans for irrigation and debridement which was canceled due to significant thrombocytopenia and lack of  drainage from right hip on day of surgery. -Patient seen in consultation by ID while recommending to monitor off antibiotics. -CT right hip with no significant fluid collection. -ID reconsulted and patient seen by Dr. Gale Journey 07/13/2020 who are recommending evaluation by surgery/orthopedic and review of films, and if no consensus is reached and patient is discharged outpatient follow-up with ID. -Patient seen by orthopedics, patient assessed by PA and it had discussions with Dr. Doreatha Martin, current recommendations are as no drainable fluid collection noted on CT scan, no increased drainage while being off antibiotics, orthopedics feels no formal irrigation and debridement needed at this time and I agree with patient remaining off antibiotics with close follow-up.  Per orthopedics additionally platelets will need to be > 50,000 prior to any procedure if surgery were to be needed. -Outpatient follow-up with orthopedics.  3.  Hypoxia/shortness of breath secondary to acute diastolic CHF exacerbation -Patient noted to be visibly short of breath on 07/14/2020 and again today 07/16/2020.. -Patient was working with PT and noted to have sats of 78% on room air with activity with sats going greater than 90% on 4 L with patient inability to tolerate ambulation in the hallway. -Concern for possible volume overload.  -Chest x-ray done with widening of upper mediastinum, bilateral interstitial prominence concern for interstitial edema and/or pneumonitis.   -Two-view chest x-ray obtained with tortuous thoracic aorta, mild background interstitial coarsening and slight patchy opacity in the lung bases may be atelectasis or pulmonary edema.   -Patient initially with clinical improvement yesterday however visibly short of breath today.   -Urine output of 1.5 L over the past 24 hours.   -Increase Lasix to 40 mg IV every 12 hours  -Strict I's and O's, daily weights.  4.  Anemia -Status post transfusion 3 units packed red blood  cells.  -Stable at 7.9.  -Transfusion threshold hemoglobin < 7.   5.  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 -Discontinue Dulera as patient feels causing her to feel more short of breath and tachycardic.   -Continue Singulair, PPI.   -Outpatient follow-up.    6.  Diabetes mellitus type 2 -Patient noted to have hypoglycemic spells and as such Lantus discontinued. -Hemoglobin A1c 6.7 (04/30/2020) -CBG 100 this morning.   -SSI.   7.  Chronic myelomonocytic leukemia -Per oncology.  8.  Acute on chronic diastolic heart failure/hypertension -Patient noted to be visibly short of breath again today with concerns for volume overload.  -Patient noted have to have been off home regimen of oral diuretics.   -Patient started on IV Lasix with some clinical improvement yesterday however today visibly short of breath again.   -Urine output of 1.5 L over the past 24 hours.  -Patient is -8.59 L during this hospitalization.  -Current weight of 102.9 kg from 108.9 kg on admission.   -Increase Lasix to 40 mg IV every 12 hours.   -Continue home regimen Toprol-XL -Strict I's and O's.   -Daily weights.   -DC Dulera.   -Follow.   9.  Hyperlipidemia -Statin.      10.  Gastroesophageal reflux disease -PPI.   11.  Morbid obesity  12.  Hypomagnesemia Replete.  13.  Constipation Sorbitol p.o. x2 doses.    DVT prophylaxis: SCDs Code Status: Full Family Communication: Updated patient.  No family at bedside. Disposition:   Status is: Inpatient    Dispo: The patient is from: Home              Anticipated d/c is to: Home with home health services              Patient currently being treated for thrombocytopenia, volume overload.  Not stable for discharge.    Difficult to place patient no       Consultants:   Interventional radiology: Dr. Shelton Silvas 07/06/2020  Infectious  disease: Dr. Baxter Flattery 07/06/2020  Orthopedics: Dr. Doreatha Martin 07/14/2020  ID: Dr. Gale Journey 07/13/2020  Procedures:   CT right hip/right lower extremity 07/07/2020  CT bone marrow biopsy 07/07/2020 --per IR, Dr. Annamaria Boots  Chest x-ray pending 07/14/2020  Transfusion 3 units packed red blood cells (3/21. 3/25).  Antimicrobials:  Anti-infectives (From admission, onward)   None       Subjective: Patient sitting up at bedside commode visibly short of breath.  Per RN patient stated got short of breath after Dulera and on ambulation to bathroom.  No chest pain.   Patient stated he got short of breath as well yesterday with Dulera.   Patient with complaints of constipation.  Objective: Vitals:   07/15/20 1946 07/15/20 2221 07/16/20 0502 07/16/20 0810  BP:  131/66 140/70   Pulse:  (!) 110 (!) 106   Resp:  20 20   Temp:  97.7 F (36.5 C) 98.4 F (36.9 C)   TempSrc:  Oral Oral   SpO2: 96% 92% 94% 91%  Weight:   102.9 kg   Height:        Intake/Output Summary (Last 24 hours) at 07/16/2020 1043 Last data filed at 07/16/2020 0505 Gross per 24 hour  Intake 240 ml  Output 1500 ml  Net -1260 ml  Filed Weights   07/05/20 2000 07/15/20 0524 07/16/20 0502  Weight: 108.9 kg 104.8 kg 102.9 kg    Examination:  General exam: NAD Respiratory system: Some decreased breath sounds in the bases otherwise clear.  Fair air movement.  Visibly short of breath. Cardiovascular system: Tachycardia.  Positive JVD.  Trace to 1+ bilateral lower extremity edema.  Gastrointestinal system: Abdomen obese, soft, nontender, nondistended, positive bowel sounds.  No rebound.  No guarding.  Central nervous system: Alert and oriented. No focal neurological deficits. Extremities: Dressing noted over posterior lateral right hip with incision site C/D/I, no drainage noted.   Skin: No rashes, lesions or ulcers Psychiatry: Judgement and insight appear normal. Mood & affect appropriate.     Data Reviewed: I have personally  reviewed following labs and imaging studies  CBC: Recent Labs  Lab 07/12/20 0448 07/13/20 0534 07/14/20 0617 07/15/20 0535 07/16/20 0512  WBC 4.7 5.0 6.1 6.4 6.6  NEUTROABS  --   --   --   --  1.0*  HGB 8.2* 8.6* 8.0* 7.9* 7.9*  HCT 26.4* 28.6* 26.9* 26.4* 25.9*  MCV 98.9 100.7* 101.9* 100.4* 99.6  PLT 20* 29* 37* 45* 41*    Basic Metabolic Panel: Recent Labs  Lab 07/13/20 1244 07/15/20 0535 07/16/20 0512  NA 137 140 137  K 4.0 3.5 3.7  CL 105 103 103  CO2 _0 GLUCOSE 91 113* 105*  BUN _1 CREATININE 0.62 0.53 0.47  CALCIUM 9.4 9.0 9.2  MG  --   --  1.6*    GFR: Estimated Creatinine Clearance: 76.4 mL/min (by C-G formula based on SCr of 0.47 mg/dL).  Liver Function Tests: Recent Labs  Lab 07/13/20 1244  AST 23  ALT 15  ALKPHOS 68  BILITOT 1.7*  PROT 7.1  ALBUMIN 2.8*    CBG: Recent Labs  Lab 07/15/20 0726 07/15/20 1204 07/15/20 1748 07/15/20 2223 07/16/20 0754  GLUCAP 99 104* 139* 122* 100*     Recent Results (from the past 240 hour(s))  Culture, blood (routine x 2)     Status: None   Collection Time: 07/06/20  1:47 PM   Specimen: BLOOD  Result Value Ref Range Status   Specimen Description   Final    BLOOD RIGHT ANTECUBITAL Performed at [facility], [place] [address], [place], [place] [address]    Special Requests   Final    BOTTLES DRAWN AEROBIC ONLY Blood Culture adequate volume Performed at [facility] [address], [place], [place] [address]    Culture   Final    NO GROWTH 5 DAYS Performed at [facility], [place] [address] [place], [place] [address]    Report Status 07/11/2020 FINAL  Final  Culture, blood (routine x 2)     Status: None   Collection Time: 07/06/20  1:47 PM   Specimen: BLOOD RIGHT HAND  Result Value Ref Range Status   Specimen Description   Final    BLOOD RIGHT HAND Performed at [facility] [address] [place], [place] [address]     Special Requests   Final    BOTTLES DRAWN AEROBIC ONLY Blood Culture adequate volume Performed at [facility] [address] [place], [place] [address]    Culture   Final    NO GROWTH 5 DAYS Performed at [facility], [place] [address], [place], [place] [address]    Report Status 07/11/2020 FINAL  Final         Radiology Studies: DG Chest 2 View  Result Date: 07/14/2020 CLINICAL DATA:  Concern for mediastinal widening on chest x-ray obtained earlier today. Hypoxia. EXAM: CHEST - 2 VIEW COMPARISON:  Portable AP view earlier today.  Chest CT 05/02/2020. FINDINGS: Borderline cardiomegaly. Mediastinal widening on prior exam is related to aortic tortuosity. Similar mediastinal appearance seen on prior exams. Mild background interstitial coarsening, with slight patchy opacity at the lung bases. No pneumothorax or significant pleural effusion. No acute osseous abnormalities are seen. IMPRESSION: 1. The questioned mediastinal widening on prior exam is related to tortuous thoracic aorta. 2. Mild background interstitial coarsening with slight patchy opacity at the lung bases, may be atelectasis or pulmonary edema. There may be underlying interstitial lung disease, that is not well assessed on the current exam. Electronically Signed   By: Keith Rake M.D.   On: 07/14/2020 19:11   DG CHEST PORT 1 VIEW  Result Date: 07/14/2020 CLINICAL DATA:  Hypoxia. EXAM: PORTABLE CHEST 1 VIEW COMPARISON:  Chest x-ray 05/10/2020. FINDINGS: One Ng of the upper mediastinum. Although this may be related to prominent great vessels and portable technique, PA and lateral chest x-ray suggested for further evaluation. If mediastinal prominence persist on PA lateral chest x-ray, contrast-enhanced chest CT suggested to further evaluate. Heart size stable. Bilateral interstitial prominence. Interstitial edema and/or pneumonitis could present in this fashion. No pleural effusion or pneumothorax.  IMPRESSION: 1. Widening of the upper mediastinum. Although this may be related to prominent great vessels and portable technique, PA and lateral chest x-ray suggested for further evaluation. If mediastinal prominence persists on PA and lateral chest x-ray, contrast-enhanced chest CT suggested to further evaluate. 2. Heart size stable. Bilateral interstitial prominence. Interstitial edema and/or pneumonitis could present in this fashion. Electronically Signed   By: Marcello Moores  Register   On: 07/14/2020 15:18        Scheduled Meds: . cholecalciferol  5,000 Units Oral Daily  . famotidine  40 mg Oral Daily  . furosemide  40 mg Intravenous Q12H  . insulin aspart  0-15 Units Subcutaneous TID WC  . linaclotide  145 mcg Oral QAC breakfast  . metoprolol succinate  25 mg Oral Daily  . metoprolol succinate  50 mg Oral Daily  . mometasone-formoterol  2 puff Inhalation BID  . montelukast  10 mg Oral QHS  . nystatin   Topical TID  . olopatadine  1 drop Both Eyes BID  . pantoprazole  40 mg Oral Daily  . pravastatin  40 mg Oral QHS  . romiPLOStim  5 mcg/kg Subcutaneous Weekly  . sertraline  100 mg Oral QHS  . sorbitol  30 mL Oral Q2H   Continuous Infusions: . magnesium sulfate bolus IVPB       LOS: 9 days    Time spent: 40 minutes    [name], MD [facility]   To contact the attending provider between 7A-7P or the covering provider during after hours 7P-7A, please log into the web site [email or web address] and access using universal [place] password for that web site. If you do not have the password, please call the hospital operator.  07/16/2020, 10:43 AM

## 2020-07-16 NOTE — Care Management Important Message (Addendum)
Important Message  Patient Details IM Letter given to the Patient. Name: Kathy Irwin MRN: [id number] Date of Birth: 24-Dec-1950   Medicare Important Message Given:  Yes     Kerin Salen 07/16/2020, 10:28 AM

## 2020-07-16 NOTE — Progress Notes (Signed)
Physical Therapy Treatment Patient Details Name: Kathy Irwin MRN: [id number] DOB: 13-Nov-1950 Today's Date: 07/16/2020    History of Present Illness 70 y.o. female with medical history significant of chronic myelomonocytic leukemia, chronic diastolic heart failure, chronic anemia, diabetes mellitus type 2, hyperlipidemia, hypersensitivity pneumonitis, interstitial lung disease, GERD, thrombocytopenia. Recent hip fracture in january 2022.I & D for R hip infection postponed 2* thrombocytopenia.    PT Comments    Pt in bed with purwick in place but pt soaked.  Assisted OOB to amb to bathroom. General transfer comment: pt able with effort but limited by weakness.  Also assisted with toilet transfer.  Pt unable to self perform hygiene "I can't reach"General Gait Details: assisted with amb to bathroom limited distance due to dyspnea 3/4.  HR 118 and RA decreased to 74%.  Reapplied oxygen, required 4 lts with activity to achieve sats >90%.  Limited distance due to 3/4 dyspnea. Assisted back to bed per pt request when she started to c/o about nausea.  RN in room at time and aware., .    SATURATION QUALIFICATIONS: (This note is used to comply with regulatory documentation for home oxygen)  Patient Saturations on Room Air at Rest = 87%  Patient Saturations on Room Air while Ambulating 10 feet to bathroom = 74% with HR 118 and dyspnea 3/4   Patient Saturations on 4 Liters of oxygen while Ambulating = 90%  Please briefly explain why patient needs home oxygen:  Pt requires supplemental oxygen to achieve therapeutic levels  Follow Up Recommendations  Home health PT     Equipment Recommendations  None recommended by PT    Recommendations for Other Services       Precautions / Restrictions Precautions Precaution Comments: monitor sats, HR    Mobility  Bed Mobility Overal bed mobility: Needs Assistance Bed Mobility: Supine to Sit;Sit to Supine     Supine to sit: Supervision Sit to supine:  Supervision   General bed mobility comments: self able but with increased effort and increased time.  Pt visibly "tired"    Transfers Overall transfer level: Needs assistance Equipment used: Rolling walker (2 wheeled) Transfers: Sit to/from [facility] Sit to Stand: Min guard;From elevated surface Stand pivot transfers: Min guard;Min assist       General transfer comment: pt able with effort but limited by weakness.  Also assisted with toilet transfer.  Pt unable to self perform hygiene "I can't reach".  Ambulation/Gait Ambulation/Gait assistance: Supervision Gait Distance (Feet): 20 Feet (10 feet x 2 to and from bathroom) Assistive device: Rolling walker (2 wheeled) Gait Pattern/deviations: Step-to pattern;Decreased weight shift to right;Decreased step length - right;Decreased step length - left Gait velocity: decreased   General Gait Details: assisted with amb to bathroom limited distance due to dyspnea 3/4.  HR 118 and RA decreased to 74%.  Reapplied oxygen, required 4 lts with activity to achieve sats >90%.  Limited distance due to 3/4 dyspnea.   Stairs             Wheelchair Mobility    Modified Rankin (Stroke Patients Only)       Balance                                            Cognition Arousal/Alertness: Awake/alert Behavior During Therapy: WFL for tasks assessed/performed Overall Cognitive Status: Within Functional Limits for tasks assessed  General Comments: AxO x 3 but slow, sluggish, delayed and somewhat sleepy      Exercises      General Comments        Pertinent Vitals/Pain Pain Assessment: No/denies pain    Home Living                      Prior Function            PT Goals (current goals can now be found in the care plan section) Progress towards PT goals: Progressing toward goals    Frequency    Min 3X/week      PT Plan Current  plan remains appropriate    Co-evaluation              AM-PAC PT "6 Clicks" Mobility   Outcome Measure  Help needed turning from your back to your side while in a flat bed without using bedrails?: None Help needed moving from lying on your back to sitting on the side of a flat bed without using bedrails?: None Help needed moving to and from a bed to a chair (including a wheelchair)?: None Help needed standing up from a chair using your arms (e.g., wheelchair or bedside chair)?: A Little Help needed to walk in hospital room?: A Little Help needed climbing 3-5 steps with a railing? : A Lot 6 Click Score: 20    End of Session Equipment Utilized During Treatment: Gait belt Activity Tolerance: Patient limited by fatigue Patient left: with call bell/phone within reach;with bed alarm set;in bed Nurse Communication: Mobility status (bed pad soaked so urine output in canister thru purwick is not accurate) PT Visit Diagnosis: Difficulty in walking, not elsewhere classified (R26.2);Pain Pain - Right/Left: Right     Time: 1350-1415 PT Time Calculation (min) (ACUTE ONLY): 25 min  Charges:  $Gait Training: 8-22 mins $Therapeutic Activity: 8-22 mins                     Rica Koyanagi  PTA Acute  Rehabilitation Services Pager      [phone] Office      [phone]

## 2020-07-17 DIAGNOSIS — D696 Thrombocytopenia, unspecified: Secondary | ICD-10-CM | POA: Diagnosis not present

## 2020-07-17 DIAGNOSIS — J679 Hypersensitivity pneumonitis due to unspecified organic dust: Secondary | ICD-10-CM | POA: Diagnosis not present

## 2020-07-17 DIAGNOSIS — C9310 Chronic myelomonocytic leukemia not having achieved remission: Secondary | ICD-10-CM | POA: Diagnosis not present

## 2020-07-17 DIAGNOSIS — D693 Immune thrombocytopenic purpura: Secondary | ICD-10-CM | POA: Diagnosis not present

## 2020-07-17 LAB — CBC
HCT: 26 % — ABNORMAL LOW (ref 36.0–46.0)
Hemoglobin: 7.9 g/dL — ABNORMAL LOW (ref 12.0–15.0)
MCH: 29.8 pg (ref 26.0–34.0)
MCHC: 30.4 g/dL (ref 30.0–36.0)
MCV: 98.1 fL (ref 80.0–100.0)
Platelets: 35 K/uL — ABNORMAL LOW (ref 150–400)
RBC: 2.65 MIL/uL — ABNORMAL LOW (ref 3.87–5.11)
RDW: 21.4 % — ABNORMAL HIGH (ref 11.5–15.5)
WBC: 7.7 K/uL (ref 4.0–10.5)
nRBC: 5.5 % — ABNORMAL HIGH (ref 0.0–0.2)

## 2020-07-17 LAB — BASIC METABOLIC PANEL WITH GFR
Anion gap: 6 (ref 5–15)
BUN: 13 mg/dL (ref 8–23)
CO2: 30 mmol/L (ref 22–32)
Calcium: 9.3 mg/dL (ref 8.9–10.3)
Chloride: 103 mmol/L (ref 98–111)
Creatinine, Ser: 0.64 mg/dL (ref 0.44–1.00)
GFR, Estimated: >60 mL/min
Glucose, Bld: 100 mg/dL — ABNORMAL HIGH (ref 70–99)
Potassium: 4 mmol/L (ref 3.5–5.1)
Sodium: 139 mmol/L (ref 135–145)

## 2020-07-17 LAB — GLUCOSE, CAPILLARY
Glucose-Capillary: 91 mg/dL (ref 70–99)
Glucose-Capillary: 107 mg/dL — ABNORMAL HIGH (ref 70–99)
Glucose-Capillary: 114 mg/dL — ABNORMAL HIGH (ref 70–99)
Glucose-Capillary: 107 mg/dL — ABNORMAL HIGH (ref 70–99)

## 2020-07-17 LAB — MAGNESIUM: Magnesium: 1.8 mg/dL (ref 1.7–2.4)

## 2020-07-17 MED ORDER — MAGNESIUM SULFATE 2 GM/50ML IV SOLN
2.0000 g | Freq: Once | INTRAVENOUS | Status: AC
  Administered 2020-07-17: 2 g via INTRAVENOUS
  Filled 2020-07-17: qty 50

## 2020-07-17 MED ORDER — SENNOSIDES-DOCUSATE SODIUM 8.6-50 MG PO TABS
1.0000 | ORAL_TABLET | Freq: Two times a day (BID) | ORAL | Status: DC
  Administered 2020-07-18 – 2020-07-21 (×7): 1 via ORAL
  Filled 2020-07-17 (×7): qty 1

## 2020-07-17 NOTE — Progress Notes (Signed)
PROGRESS NOTE    Kathy Irwin  DQQ:[id number] DOB: 15-Jun-1950 DOA: 07/05/2020 PCP: Kerin Perna, NP    No chief complaint on file.   Brief Narrative:  Kathy Irwin a 70 y.o.femalewith medical history significant ofchronic myelomonocytic leukemia, chronic diastolic heart failure, chronic anemia, diabetes mellitus type 2, hyperlipidemia, hypersensitivity pneumonitis, interstitial lung disease, GERD, thrombocytopenia.Patient presented from oncology office for acute on chronic low platelets it was thought to be related to ITP.     Assessment & Plan:   Principal Problem:   Thrombocytopenia ([place]) Active Problems:   Hypoxia   Hypersensitivity pneumonitis (HCC)   Obesity, Class III, BMI 40-49.9 (morbid obesity) ([place])   Essential hypertension   Postoperative wound infection of right hip   Hyperbilirubinemia   Idiopathic thrombocytopenia (HCC)   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[place])   Counseling regarding advance care planning and goals of care   Pyogenic arthritis of right hip (HCC)   Symptomatic anemia   1 ITP -Baseline platelets around 50-60,000. -Patient being followed by Dr. Irene Limbo in the outpatient setting. -On admission platelets noted to be at 17,000 from 44,000 days prior to admission. -Status post 2 doses of Nplate and IVIG platelets currently at 35,000. -Status post bone marrow biopsy 07/07/2020 with features consistent with previously known CMML-1. -Per oncology note lack of adequate response to IVIG and Nplate suggest thrombocytopenia is not completely related to ITP alone but could also be related to her CMML. -Per oncology platelet transfusion for platelets <10,000 or if acute bleeding. -Platelet count slowly trending back down. -Repeat labs in the morning. -Per oncology. -Follow.  2.  Right hip infection status post IM nail 04/30/2020 -Patient noted to have developed a postop infection of the right hip with plans for irrigation and debridement  which was canceled due to significant thrombocytopenia and lack of drainage from right hip on day of surgery. -Patient seen in consultation by ID while recommending to monitor off antibiotics. -CT right hip with no significant fluid collection. -ID reconsulted and patient seen by Dr. Gale Journey 07/13/2020 who are recommending evaluation by surgery/orthopedic and review of films, and if no consensus is reached and patient is discharged outpatient follow-up with ID. -Patient seen by orthopedics, patient assessed by PA and it had discussions with Dr. Doreatha Martin, current recommendations are as no drainable fluid collection noted on CT scan, no increased drainage while being off antibiotics, orthopedics feels no formal irrigation and debridement needed at this time and I agree with patient remaining off antibiotics with close follow-up.  Per orthopedics additionally platelets will need to be > 50,000 prior to any procedure if surgery were to be needed. -Outpatient follow-up with orthopedics.  3.  Hypoxia/shortness of breath secondary to acute diastolic CHF exacerbation -Patient noted to be visibly short of breath on 07/14/2020 and 07/16/2020.. -Patient was working with PT and noted to have sats of 78% on room air with activity with sats going greater than 90% on 4 L with patient inability to tolerate ambulation in the hallway. -Concern for possible volume overload.  -Chest x-ray done with widening of upper mediastinum, bilateral interstitial prominence concern for interstitial edema and/or pneumonitis.   -Two-view chest x-ray obtained with tortuous thoracic aorta, mild background interstitial coarsening and slight patchy opacity in the lung bases may be atelectasis or pulmonary edema.   -Patient improving clinically with diuresis on IV Lasix.   -Urine output of 1.5 L over the past 24 hours however inaccurate as purwick was malfunctioning.   -Continue Lasix 40 mg IV  every 12 hours  -Strict I's and O's, daily weights.     4.  Anemia -Status post transfusion 3 units packed red blood cells.  -Stable at 7.9.  -Transfusion threshold hemoglobin < 7.   5.  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 -Discontinued Dulera as patient feels causing her to feel more short of breath and tachycardic.   -Continue Singulair, PPI.   -Outpatient follow-up.    6.  Diabetes mellitus type 2 -Patient noted to have hypoglycemic spells and as such Lantus discontinued. -Hemoglobin A1c 6.7 (04/30/2020) -CBG 91 this morning.   -SSI.   7.  Chronic myelomonocytic leukemia -Per oncology.  8.  Acute on chronic diastolic heart failure/hypertension -Patient improving clinically.  -Patient noted have to have been off home regimen of oral diuretics early on during the hospitalization.   -Currently on Lasix 40 mg IV every 12 hours with urine output of 1.5 L over the past 24 hours however not fully accurate as purwick was malfunctioning yesterday.  -Patient -10.1 L during this hospitalization. -Weight not recorded today. -Weight noted to be 102.9 kg(07/16/2020) from 108.9 kg(07/05/2020) -Continue home regimen Toprol-XL, IV Lasix -Dulera discontinued. -Strict I's and O's, daily weights -Would likely transition back to home dose oral Lasix in the next 1 to 2 days. -Follow.   9.  Hyperlipidemia -Statin.      10.  Gastroesophageal reflux disease -PPI.   11.  Morbid obesity  12.  Hypomagnesemia Magnesium sulfate 2 g IV x1.   13.  Constipation Patient given sorbitol p.o. x2 doses with good results.  Place on bowel regimen of Senokot S2 twice daily.     DVT prophylaxis: SCDs Code Status: Full Family Communication: Updated patient.  No family at bedside. Disposition:   Status is: Inpatient    Dispo: The patient is from: Home              Anticipated d/c is to: Home with home health services              Patient  currently being treated for thrombocytopenia, volume overload.  Not stable for discharge.    Difficult to place patient no       Consultants:   Interventional radiology: Dr. Shelton Silvas 07/06/2020  Infectious disease: Dr. Baxter Flattery 07/06/2020  Orthopedics: Dr. Doreatha Martin 07/14/2020  ID: Dr. Gale Journey 07/13/2020  Procedures:   CT right hip/right lower extremity 07/07/2020  CT bone marrow biopsy 07/07/2020 --per IR, Dr. Annamaria Boots  Chest x-ray 07/14/2020  Transfusion 3 units packed red blood cells (3/21. 3/25).  Antimicrobials:  Anti-infectives (From admission, onward)   None       Subjective: Laying in bed.  Feels shortness of breath has improved since yesterday with diuretics.  No chest pain.  No bleeding.  Objective: Vitals:   07/16/20 1336 07/16/20 2208 07/17/20 0506 07/17/20 0850  BP: 130/63 (!) 145/66 (!) 130/59 128/64  Pulse: (!) 101 75 82 82  Resp: _0 Temp: 98.2 F (36.8 C)   97.9 F (36.6 C)  TempSrc: Oral   Oral  SpO2: 96% 100% 97% 98%  Weight:      Height:        Intake/Output Summary (Last 24 hours) at 07/17/2020 1031 Last data filed at 07/17/2020 0558 Gross per 24 hour  Intake 360 ml  Output 1500 ml  Net -1140 ml   [employer]  07/05/20 2000 07/15/20 0524 07/16/20 0502  Weight: 108.9 kg 104.8 kg 102.9 kg    Examination:  General exam: NAD Respiratory system: Some bibasilar crackles.  No wheezing.  Fair air movement.  Speaking in full sentences. Cardiovascular system: Regular rate rhythm.  Positive JVD.  Trace to 1+ bilateral lower extremity edema.  Gastrointestinal system: Abdomen is obese, soft, nontender, nondistended, positive bowel sounds.  No rebound.  No guarding.  Central nervous system: Alert and oriented. No focal neurological deficits. Extremities: Dressing noted over posterior lateral right hip with incision site C/D/I, no drainage noted.   Skin: No rashes, lesions or ulcers Psychiatry: Judgement and insight appear normal. Mood & affect  appropriate.     Data Reviewed: I have personally reviewed following labs and imaging studies  CBC: Recent Labs  Lab 07/13/20 0534 07/14/20 0617 07/15/20 0535 07/16/20 0512 07/17/20 0540  WBC 5.0 6.1 6.4 6.6 7.7  NEUTROABS  --   --   --  1.0*  --   HGB 8.6* 8.0* 7.9* 7.9* 7.9*  HCT 28.6* 26.9* 26.4* 25.9* 26.0*  MCV 100.7* 101.9* 100.4* 99.6 98.1  PLT 29* 37* 45* 41* 35*    Basic Metabolic Panel: Recent Labs  Lab 07/13/20 1244 07/15/20 0535 07/16/20 0512 07/17/20 0540  NA 137 140 137 139  K 4.0 3.5 3.7 4.0  CL 105 103 103 103  CO2 _0 GLUCOSE 91 113* 105* 100*  BUN _1 CREATININE 0.62 0.53 0.47 0.64  CALCIUM 9.4 9.0 9.2 9.3  MG  --   --  1.6* 1.8    GFR: Estimated Creatinine Clearance: 76.4 mL/min (by C-G formula based on SCr of 0.64 mg/dL).  Liver Function Tests: Recent Labs  Lab 07/13/20 1244  AST 23  ALT 15  ALKPHOS 68  BILITOT 1.7*  PROT 7.1  ALBUMIN 2.8*    CBG: Recent Labs  Lab 07/16/20 0754 07/16/20 1143 07/16/20 1653 07/16/20 2209 07/17/20 0801  GLUCAP 100* 147* 95 146* 91     No results found for this or any previous visit (from the past 240 hour(s)).       Radiology Studies: No results found.      Scheduled Meds: . cholecalciferol  5,000 Units Oral Daily  . famotidine  40 mg Oral Daily  . furosemide  40 mg Intravenous Q12H  . insulin aspart  0-15 Units Subcutaneous TID WC  . linaclotide  145 mcg Oral QAC breakfast  . metoprolol succinate  25 mg Oral Daily  . metoprolol succinate  50 mg Oral Daily  . montelukast  10 mg Oral QHS  . nystatin   Topical TID  . olopatadine  1 drop Both Eyes BID  . pantoprazole  40 mg Oral Daily  . pravastatin  40 mg Oral QHS  . romiPLOStim  5 mcg/kg Subcutaneous Weekly  . sertraline  100 mg Oral QHS   Continuous Infusions:    LOS: 10 days    Time spent: 40 minutes    [name], MD [facility]   To contact the attending provider between  7A-7P or the covering provider during after hours 7P-7A, please log into the web site [email or web address] and access using universal [place] password for that web site. If you do not have the password, please call the hospital operator.  07/17/2020, 10:31 AM

## 2020-07-18 DIAGNOSIS — J679 Hypersensitivity pneumonitis due to unspecified organic dust: Secondary | ICD-10-CM | POA: Diagnosis not present

## 2020-07-18 DIAGNOSIS — D693 Immune thrombocytopenic purpura: Secondary | ICD-10-CM | POA: Diagnosis not present

## 2020-07-18 DIAGNOSIS — D696 Thrombocytopenia, unspecified: Secondary | ICD-10-CM | POA: Diagnosis not present

## 2020-07-18 DIAGNOSIS — C9310 Chronic myelomonocytic leukemia not having achieved remission: Secondary | ICD-10-CM | POA: Diagnosis not present

## 2020-07-18 LAB — CBC WITH DIFFERENTIAL/PLATELET
Abs Immature Granulocytes: 0.7 10*3/uL — ABNORMAL HIGH (ref 0.00–0.07)
Basophils Absolute: 0 10*3/uL (ref 0.0–0.1)
Basophils Relative: 1 %
Eosinophils Absolute: 0 10*3/uL (ref 0.0–0.5)
Eosinophils Relative: 0 %
HCT: 26.7 % — ABNORMAL LOW (ref 36.0–46.0)
Hemoglobin: 8.3 g/dL — ABNORMAL LOW (ref 12.0–15.0)
Immature Granulocytes: 8 %
Lymphocytes Relative: 32 %
Lymphs Abs: 2.7 10*3/uL (ref 0.7–4.0)
MCH: 29.3 pg (ref 26.0–34.0)
MCHC: 31.1 g/dL (ref 30.0–36.0)
MCV: 94.3 fL (ref 80.0–100.0)
Monocytes Absolute: 3.8 10*3/uL — ABNORMAL HIGH (ref 0.1–1.0)
Monocytes Relative: 44 %
Neutro Abs: 1.3 10*3/uL — ABNORMAL LOW (ref 1.7–7.7)
Neutrophils Relative %: 15 %
Platelets: 26 10*3/uL — CL (ref 150–400)
RBC: 2.83 MIL/uL — ABNORMAL LOW (ref 3.87–5.11)
RDW: 21 % — ABNORMAL HIGH (ref 11.5–15.5)
WBC: 8.5 10*3/uL (ref 4.0–10.5)
nRBC: 7.2 % — ABNORMAL HIGH (ref 0.0–0.2)

## 2020-07-18 LAB — BASIC METABOLIC PANEL
Anion gap: 5 (ref 5–15)
BUN: 18 mg/dL (ref 8–23)
CO2: 29 mmol/L (ref 22–32)
Calcium: 8.9 mg/dL (ref 8.9–10.3)
Chloride: 100 mmol/L (ref 98–111)
Creatinine, Ser: 0.73 mg/dL (ref 0.44–1.00)
GFR, Estimated: >60 mL/min (ref >60)
Glucose, Bld: 88 mg/dL (ref 70–99)
Potassium: 3.6 mmol/L (ref 3.5–5.1)
Sodium: 134 mmol/L — ABNORMAL LOW (ref 135–145)

## 2020-07-18 LAB — GLUCOSE, CAPILLARY
Glucose-Capillary: 89 mg/dL (ref 70–99)
Glucose-Capillary: 121 mg/dL — ABNORMAL HIGH (ref 70–99)
Glucose-Capillary: 96 mg/dL (ref 70–99)
Glucose-Capillary: 98 mg/dL (ref 70–99)

## 2020-07-18 LAB — MAGNESIUM: Magnesium: 2 mg/dL (ref 1.7–2.4)

## 2020-07-18 MED ORDER — FUROSEMIDE 40 MG PO TABS
40.0000 mg | ORAL_TABLET | Freq: Every day | ORAL | Status: DC
  Administered 2020-07-19 – 2020-07-21 (×3): 40 mg via ORAL
  Filled 2020-07-18 (×3): qty 1

## 2020-07-18 MED ORDER — POTASSIUM CHLORIDE CRYS ER 20 MEQ PO TBCR
40.0000 meq | EXTENDED_RELEASE_TABLET | Freq: Once | ORAL | Status: AC
  Administered 2020-07-18: 40 meq via ORAL
  Filled 2020-07-18: qty 2

## 2020-07-18 NOTE — Progress Notes (Signed)
PROGRESS NOTE    Kathy Irwin  HFW:[id number] DOB: 03/08/1951 DOA: 07/05/2020 PCP: Kerin Perna, NP    No chief complaint on file.   Brief Narrative:  Kathy Irwin a 70 y.o.femalewith medical history significant ofchronic myelomonocytic leukemia, chronic diastolic heart failure, chronic anemia, diabetes mellitus type 2, hyperlipidemia, hypersensitivity pneumonitis, interstitial lung disease, GERD, thrombocytopenia.Patient presented from oncology office for acute on chronic low platelets it was thought to be related to ITP.     Assessment & Plan:   Principal Problem:   Thrombocytopenia ([place]) Active Problems:   Hypoxia   Hypersensitivity pneumonitis (HCC)   Obesity, Class III, BMI 40-49.9 (morbid obesity) ([place])   Essential hypertension   Postoperative wound infection of right hip   Hyperbilirubinemia   Idiopathic thrombocytopenia (HCC)   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[place])   Counseling regarding advance care planning and goals of care   Pyogenic arthritis of right hip (HCC)   Symptomatic anemia   1 ITP -Baseline platelets around 50-60,000. -Patient being followed by Dr. Irene Limbo in the outpatient setting. -On admission platelets noted to be at 17,000 from 44,000 days prior to admission. -Status post 2 doses of Nplate and IVIG platelets trending down and currently at 26,000. -Status post bone marrow biopsy 07/07/2020 with features consistent with previously known CMML-1. -Per oncology note lack of adequate response to IVIG and Nplate suggest thrombocytopenia is not completely related to ITP alone but could also be related to her CMML. -Per oncology platelet transfusion for platelets <10,000 or if acute bleeding. -Platelet count trending back down.   -Patient due for Nplate in 2 days per  -Repeat labs in the morning.   -Per Hematology/Oncology.   2.  Right hip infection status post IM nail 04/30/2020 -Patient noted to have developed a postop infection  of the right hip with plans for irrigation and debridement which was canceled due to significant thrombocytopenia and lack of drainage from right hip on day of surgery. -Patient seen in consultation by ID while recommending to monitor off antibiotics. -CT right hip with no significant fluid collection. -ID reconsulted and patient seen by Dr. Gale Journey 07/13/2020 who are recommending evaluation by surgery/orthopedic and review of films, and if no consensus is reached and patient is discharged outpatient follow-up with ID. -Patient seen by orthopedics, patient assessed by PA and it had discussions with Dr. Doreatha Martin, current recommendations are as no drainable fluid collection noted on CT scan, no increased drainage while being off antibiotics, orthopedics feels no formal irrigation and debridement needed at this time and I agree with patient remaining off antibiotics with close follow-up.  Per orthopedics additionally platelets will need to be > 50,000 prior to any procedure if surgery were to be needed. -Outpatient follow-up with orthopedics.  3.  Hypoxia/shortness of breath secondary to acute diastolic CHF exacerbation -Patient noted to be visibly short of breath on 07/14/2020 and 07/16/2020.. -Patient was working with PT and noted to have sats of 78% on room air with activity with sats going greater than 90% on 4 L with patient inability to tolerate ambulation in the hallway. -Patient was likely volume overloaded and improving with IV diuretics.  -Chest x-ray done with widening of upper mediastinum, bilateral interstitial prominence concern for interstitial edema and/or pneumonitis.   -Two-view chest x-ray obtained with tortuous thoracic aorta, mild background interstitial coarsening and slight patchy opacity in the lung bases may be atelectasis or pulmonary edema.   -Patient improving clinically with diuresis on IV Lasix.   -Urine output of  1.6 L over the past 24 hours however inaccurate as purwick was  malfunctioning.   -Transition from IV Lasix to oral Lasix.   -Strict I's and O's.  Daily weights.    4.  Anemia -Status post transfusion 3 units packed red blood cells.  -Hemoglobin stable at 8.3.  -Transfusion threshold hemoglobin < 7.   5.  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 -Discontinued Dulera as patient feels causing her to feel more short of breath and tachycardic.   -Continue Singulair, PPI.   -Outpatient follow-up.    6.  Diabetes mellitus type 2 -Patient noted to have hypoglycemic spells and as such Lantus discontinued. -Hemoglobin A1c 6.7 (04/30/2020) -CBG 89 this morning.   -SSI.   7.  Chronic myelomonocytic leukemia -Per oncology.  8.  Acute on chronic diastolic heart failure/hypertension -Clinical improvement.   -Patient noted have to have been off home regimen of oral diuretics early on during the hospitalization.   -Currently on Lasix 40 mg IV every 12 hours with UOP 1.6 L over the past 24 hours.  Unsure about accuracy of urine output  -Patient is -10.6 L during his hospitalization.  -Weight of 103.5 kg from 108.9 kg (07/05/2020).  [name] discontinued -Continue home regimen Toprol-XL.  -Transition from IV Lasix to oral Lasix.  -Strict I's and O's, daily weights. -Follow.   9.  Hyperlipidemia -Continue statin.      10.  Gastroesophageal reflux disease -PPI.   11.  Morbid obesity  12.  Hypomagnesemia Repleted.  Magnesium at 2.0.  13.  Constipation -Resolved with sorbitol.   -Continue current bowel regimen of Senokot S2 twice daily.     DVT prophylaxis: SCDs Code Status: Full Family Communication: Updated patient.  No family at bedside. Disposition:   Status is: Inpatient    Dispo: The patient is from: Home              Anticipated d/c is to: Home with home health services              Patient currently being treated for  thrombocytopenia, volume overload.  Not stable for discharge.    Difficult to place patient no       Consultants:   Interventional radiology: Dr. Shelton Silvas 07/06/2020  Infectious disease: Dr. Baxter Flattery 07/06/2020  Orthopedics: Dr. Doreatha Martin 07/14/2020  ID: Dr. Gale Journey 07/13/2020  Procedures:   CT right hip/right lower extremity 07/07/2020  CT bone marrow biopsy 07/07/2020 --per IR, Dr. Annamaria Boots  Chest x-ray 07/14/2020  Transfusion 3 units packed red blood cells (3/21. 3/25).  Antimicrobials:  Anti-infectives (From admission, onward)   None       Subjective: Sitting up in chair, stated just finished washing up and feeling a little short of breath overall shortness of breath has improved since being on diuretics.  Denies any bleeding.  No chest pain.  Some right hip pain however unchanged.   Wondering what is causing her platelets to be low. Laying in bed.  No bleeding.  Objective: Vitals:   07/17/20 2051 07/18/20 0500 07/18/20 0601 07/18/20 0700  BP: (!) 108/54  (!) 117/58   Pulse: 83  78   Resp: 20  17   Temp:   98.3 F (36.8 C)   TempSrc:   Oral   SpO2: 96%  95%   Weight:  103.5 kg  103.5 kg  Height:  Intake/Output Summary (Last 24 hours) at 07/18/2020 1033 Last data filed at 07/18/2020 0610 Gross per 24 hour  Intake 480 ml  Output 1600 ml  Net -1120 ml   [employer]   07/16/20 0502 07/18/20 0500 07/18/20 0700  Weight: 102.9 kg 103.5 kg 103.5 kg    Examination:  General exam: NAD Respiratory system: Some decreased breath sounds in the bases otherwise clear.  No wheezing.  Fair air movement.  Cardiovascular system: RRR no murmurs rubs or gallops.  Positive JVD.  Trace bilateral lower extremity edema.  Gastrointestinal system: Abdomen is soft, obese, nontender, nondistended, positive bowel sounds.  No rebound.  No guarding.  Central nervous system: Alert and oriented.  No focal neurological deficits.   Extremities: Dressing noted over posterior lateral right hip  with incision site C/D/I, no drainage noted.   Skin: No rashes, lesions or ulcers Psychiatry: Judgement and insight appear normal. Mood & affect appropriate.     Data Reviewed: I have personally reviewed following labs and imaging studies  CBC: Recent Labs  Lab 07/14/20 0617 07/15/20 0535 07/16/20 0512 07/17/20 0540 07/18/20 0825  WBC 6.1 6.4 6.6 7.7 8.5  NEUTROABS  --   --  1.0*  --  1.3*  HGB 8.0* 7.9* 7.9* 7.9* 8.3*  HCT 26.9* 26.4* 25.9* 26.0* 26.7*  MCV 101.9* 100.4* 99.6 98.1 94.3  PLT 37* 45* 41* 35* 26*    Basic Metabolic Panel: Recent Labs  Lab 07/13/20 1244 07/15/20 0535 07/16/20 0512 07/17/20 0540 07/18/20 0512 07/18/20 0825  NA 137 140 137 139  --  134*  K 4.0 3.5 3.7 4.0  --  3.6  CL 105 103 103 103  --  100  CO2 _0 --  29  GLUCOSE 91 113* 105* 100*  --  88  BUN _1 --  18  CREATININE 0.62 0.53 0.47 0.64  --  0.73  CALCIUM 9.4 9.0 9.2 9.3  --  8.9  MG  --   --  1.6* 1.8 2.0  --     GFR: Estimated Creatinine Clearance: 76.6 mL/min (by C-G formula based on SCr of 0.73 mg/dL).  Liver Function Tests: Recent Labs  Lab 07/13/20 1244  AST 23  ALT 15  ALKPHOS 68  BILITOT 1.7*  PROT 7.1  ALBUMIN 2.8*    CBG: Recent Labs  Lab 07/17/20 0801 07/17/20 1130 07/17/20 1642 07/17/20 2052 07/18/20 0725  GLUCAP 91 107* 114* 107* 89     No results found for this or any previous visit (from the past 240 hour(s)).       Radiology Studies: No results found.      Scheduled Meds: . cholecalciferol  5,000 Units Oral Daily  . famotidine  40 mg Oral Daily  . furosemide  40 mg Intravenous Q12H  . insulin aspart  0-15 Units Subcutaneous TID WC  . linaclotide  145 mcg Oral QAC breakfast  . metoprolol succinate  25 mg Oral Daily  . metoprolol succinate  50 mg Oral Daily  . montelukast  10 mg Oral QHS  . nystatin   Topical TID  . olopatadine  1 drop Both Eyes BID  . pantoprazole  40 mg Oral Daily  . potassium chloride  40  mEq Oral Once  . pravastatin  40 mg Oral QHS  . romiPLOStim  5 mcg/kg Subcutaneous Weekly  . senna-docusate  1 tablet Oral BID  . sertraline  100 mg Oral QHS   Continuous Infusions:  LOS: 11 days    Time spent: 35 minutes    [name], MD [facility]   To contact the attending provider between 7A-7P or the covering provider during after hours 7P-7A, please log into the web site [email or web address] and access using universal [place] password for that web site. If you do not have the password, please call the hospital operator.  07/18/2020, 10:33 AM

## 2020-07-18 NOTE — Progress Notes (Signed)
Occupational Therapy Treatment Patient Details Name: Kathy Irwin MRN: [id number] DOB: 10/19/1950 Today's Date: 07/18/2020    History of present illness 70 y.o. female with medical history significant of chronic myelomonocytic leukemia, chronic diastolic heart failure, chronic anemia, diabetes mellitus type 2, hyperlipidemia, hypersensitivity pneumonitis, interstitial lung disease, GERD, thrombocytopenia. Recent hip fracture in january 2022.I & D for R hip infection postponed 2* thrombocytopenia.   OT comments  Limited session today, encourage patient to participate in functional ambulation however patient stating she's been up to chair "long time" and should of been back already. Patient needing min G assist for transfer due to R LE pain/difficulty advancing LE. Patient also stabilizing unilateral UE on bed frame for support. Patient needing increased time for all mobility including laying self back into bed. Patient needing verbal cues to sequence scooting herself up in bed with B LE/UEs. At end patient states "we'll do it, we'll walk." Cont with POC.   Follow Up Recommendations  Home health OT;Supervision/Assistance - 24 hour    Equipment Recommendations  None recommended by OT       Precautions / Restrictions Precautions Precautions: Fall Precaution Comments: monitor sats, HR Restrictions Weight Bearing Restrictions: No       Mobility Bed Mobility Overal bed mobility: Needs Assistance Bed Mobility: Sit to Supine       Sit to supine: Supervision   General bed mobility comments: self able but needs increased time and effort. needed mod cues to correct posture once in bed and to assist with B LE/UEs to scoot up to head of bed    Transfers Overall transfer level: Needs assistance Equipment used: None Transfers: Stand Pivot Transfers   Stand pivot transfers: Min guard       General transfer comment: patient limited by fatigue, holding onto bed frame for stability, min G  for safety    Balance Overall balance assessment: Mild deficits observed, not formally tested                                         ADL either performed or assessed with clinical judgement   ADL Overall ADL's : Needs assistance/impaired                         Toilet Transfer: Min Designer, jewellery Details (indicate cue type and reason): from recliner back to bed, patient reports fatigued and declines any further mobility         Functional mobility during ADLs: Min guard                 Cognition Arousal/Alertness: Awake/alert Behavior During Therapy: WFL for tasks assessed/performed Overall Cognitive Status: Within Functional Limits for tasks assessed                                                     Pertinent Vitals/ Pain       Pain Assessment: Faces Faces Pain Scale: Hurts little more Pain Location: R LE Pain Descriptors / Indicators: Grimacing Pain Intervention(s): Limited activity within patient's tolerance         Frequency  Min 2X/week        Progress Toward Goals  OT Goals(current goals can now be found in  the care plan section)  Progress towards OT goals: Progressing toward goals  Acute Rehab OT Goals Patient Stated Goal: DC home OT Goal Formulation: With patient Time For Goal Achievement: 07/20/20 Potential to Achieve Goals: Good ADL Goals Pt Will Perform Grooming: with modified independence;standing;sitting Pt Will Perform Upper Body Dressing: Independently Pt Will Perform Lower Body Dressing: with modified independence;sit to/from stand;sitting/lateral leans Pt Will Transfer to Toilet: with modified independence;ambulating;grab bars;regular height toilet (rollator) Pt Will Perform Toileting - Clothing Manipulation and hygiene: with modified independence;sitting/lateral leans;sit to/from stand  Plan Discharge plan remains appropriate       AM-PAC OT "6 Clicks" Daily  Activity     Outcome Measure   Help from another person eating meals?: None Help from another person taking care of personal grooming?: A Little Help from another person toileting, which includes using toliet, bedpan, or urinal?: A Little Help from another person bathing (including washing, rinsing, drying)?: A Lot Help from another person to put on and taking off regular upper body clothing?: A Little Help from another person to put on and taking off regular lower body clothing?: A Little 6 Click Score: 18    End of Session Equipment Utilized During Treatment: Oxygen  OT Visit Diagnosis: Unsteadiness on feet (R26.81);Pain Pain - Right/Left: Right Pain - part of body: Leg   Activity Tolerance Patient limited by fatigue   Patient Left in bed;with call bell/phone within reach;with bed alarm set   Nurse Communication Mobility status        Time: 4481-8563 OT Time Calculation (min): 9 min  Charges: OT General Charges $OT Visit: 1 Visit OT Treatments $Self Care/Home Management : 8-22 mins  Delbert Phenix OT OT pager: [place] 07/18/2020, 2:32 PM

## 2020-07-18 NOTE — Progress Notes (Signed)
Date and time results received: 07/18/20 now (use smartphrase ".now" to insert current time)  Test:  Critical Value: PLT  26  Name of Provider Notified: Dr Grandville Silos  Orders Received?  Yes Or Actions Taken?: yes

## 2020-07-19 ENCOUNTER — Telehealth: Payer: Self-pay | Admitting: Hematology

## 2020-07-19 DIAGNOSIS — J679 Hypersensitivity pneumonitis due to unspecified organic dust: Secondary | ICD-10-CM | POA: Diagnosis not present

## 2020-07-19 DIAGNOSIS — C9310 Chronic myelomonocytic leukemia not having achieved remission: Secondary | ICD-10-CM | POA: Diagnosis not present

## 2020-07-19 DIAGNOSIS — D696 Thrombocytopenia, unspecified: Secondary | ICD-10-CM | POA: Diagnosis not present

## 2020-07-19 DIAGNOSIS — D693 Immune thrombocytopenic purpura: Secondary | ICD-10-CM | POA: Diagnosis not present

## 2020-07-19 LAB — BASIC METABOLIC PANEL
Anion gap: 7 (ref 5–15)
BUN: 19 mg/dL (ref 8–23)
CO2: 26 mmol/L (ref 22–32)
Calcium: 8.9 mg/dL (ref 8.9–10.3)
Chloride: 102 mmol/L (ref 98–111)
Creatinine, Ser: 0.6 mg/dL (ref 0.44–1.00)
GFR, Estimated: >60 mL/min (ref >60)
Glucose, Bld: 94 mg/dL (ref 70–99)
Potassium: 3.8 mmol/L (ref 3.5–5.1)
Sodium: 135 mmol/L (ref 135–145)

## 2020-07-19 LAB — CBC WITH DIFFERENTIAL/PLATELET
Abs Immature Granulocytes: 0.78 10*3/uL — ABNORMAL HIGH (ref 0.00–0.07)
Basophils Absolute: 0 10*3/uL (ref 0.0–0.1)
Basophils Relative: 0 %
Eosinophils Absolute: 0 10*3/uL (ref 0.0–0.5)
Eosinophils Relative: 0 %
HCT: 25.2 % — ABNORMAL LOW (ref 36.0–46.0)
Hemoglobin: 7.8 g/dL — ABNORMAL LOW (ref 12.0–15.0)
Immature Granulocytes: 8 %
Lymphocytes Relative: 31 %
Lymphs Abs: 2.9 10*3/uL (ref 0.7–4.0)
MCH: 30 pg (ref 26.0–34.0)
MCHC: 31 g/dL (ref 30.0–36.0)
MCV: 96.9 fL (ref 80.0–100.0)
Monocytes Absolute: 4.2 10*3/uL — ABNORMAL HIGH (ref 0.1–1.0)
Monocytes Relative: 46 %
Neutro Abs: 1.4 10*3/uL — ABNORMAL LOW (ref 1.7–7.7)
Neutrophils Relative %: 15 %
Platelets: 24 10*3/uL — CL (ref 150–400)
RBC: 2.6 MIL/uL — ABNORMAL LOW (ref 3.87–5.11)
RDW: 21.2 % — ABNORMAL HIGH (ref 11.5–15.5)
WBC: 9.3 10*3/uL (ref 4.0–10.5)
nRBC: 6.1 % — ABNORMAL HIGH (ref 0.0–0.2)

## 2020-07-19 LAB — GLUCOSE, CAPILLARY
Glucose-Capillary: 94 mg/dL (ref 70–99)
Glucose-Capillary: 132 mg/dL — ABNORMAL HIGH (ref 70–99)
Glucose-Capillary: 112 mg/dL — ABNORMAL HIGH (ref 70–99)
Glucose-Capillary: 105 mg/dL — ABNORMAL HIGH (ref 70–99)

## 2020-07-19 LAB — MAGNESIUM: Magnesium: 1.8 mg/dL (ref 1.7–2.4)

## 2020-07-19 LAB — C-REACTIVE PROTEIN: CRP: 7.9 mg/dL — ABNORMAL HIGH (ref <1.0)

## 2020-07-19 MED ORDER — SIMETHICONE 80 MG PO CHEW
160.0000 mg | CHEWABLE_TABLET | Freq: Four times a day (QID) | ORAL | Status: DC
  Administered 2020-07-19 – 2020-07-21 (×7): 160 mg via ORAL
  Filled 2020-07-19 (×7): qty 2

## 2020-07-19 MED ORDER — ROMIPLOSTIM INJECTION 500 MCG
5.0000 ug/kg | SUBCUTANEOUS | Status: DC
  Administered 2020-07-19: 545 ug via SUBCUTANEOUS
  Filled 2020-07-19: qty 1

## 2020-07-19 NOTE — Progress Notes (Signed)
Physical Therapy Treatment Patient Details Name: Kathy Irwin MRN: [id number] DOB: [birth year] Today's Date: 07/19/2020    History of Present Illness 70 y.o. female with medical history significant of chronic myelomonocytic leukemia, chronic diastolic heart failure, chronic anemia, diabetes mellitus type 2, hyperlipidemia, hypersensitivity pneumonitis, interstitial lung disease, GERD, thrombocytopenia. Recent hip fracture in january 2022.I & D for R hip infection postponed 2* thrombocytopenia.    PT Comments    Pt not progressing as well as expected and present with extended length of stay.  Will consult evaluating LPT, pt may need ST Rehab at SNF as she lives home alone.   Assisted OOB to amb to bathroom.  Assisted with hygiene seated position.  Too fatigued to amb in hallway, assisted to recliner.    Follow Up Recommendations  SNF     Equipment Recommendations  None recommended by PT    Recommendations for Other Services       Precautions / Restrictions Precautions Precautions: Fall Precaution Comments: monitor sats, HR    Mobility  Bed Mobility Overal bed mobility: Needs Assistance Bed Mobility: Supine to Sit     Supine to sit: Supervision     General bed mobility comments: self able but needs increased time and effort.    Transfers Overall transfer level: Needs assistance Equipment used: None Transfers: Stand Pivot Transfers Sit to Stand: Min guard;From elevated surface Stand pivot transfers: Min guard       General transfer comment: patient limited by fatigue, also assisted with toilet tranfer  Ambulation/Gait Ambulation/Gait assistance: Supervision Gait Distance (Feet): 20 Feet Assistive device: Rolling walker (2 wheeled) Gait Pattern/deviations: Step-to pattern;Decreased weight shift to right;Decreased step length - right;Decreased step length - left Gait velocity: decreased   General Gait Details: assisted with amb to and from bathroom remained on 3  lts sats to achieve 90% or >.  Visibly SOB.  Required rest breaks.   Stairs             Wheelchair Mobility    Modified Rankin (Stroke Patients Only)       Balance                                            Cognition Arousal/Alertness: Awake/alert Behavior During Therapy: WFL for tasks assessed/performed Overall Cognitive Status: Within Functional Limits for tasks assessed                                 General Comments: AxO x 3 "I got some bad news today"      Exercises      General Comments        Pertinent Vitals/Pain Pain Assessment: No/denies pain    Home Living                      Prior Function            PT Goals (current goals can now be found in the care plan section) Progress towards PT goals: Progressing toward goals    Frequency    Min 3X/week      PT Plan Discharge plan needs to be updated    Co-evaluation              AM-PAC PT "6 Clicks" Mobility   Outcome Measure  Help needed turning from your  back to your side while in a flat bed without using bedrails?: A Little Help needed moving from lying on your back to sitting on the side of a flat bed without using bedrails?: A Little Help needed moving to and from a bed to a chair (including a wheelchair)?: A Little Help needed standing up from a chair using your arms (e.g., wheelchair or bedside chair)?: A Little Help needed to walk in hospital room?: A Little Help needed climbing 3-5 steps with a railing? : A Lot 6 Click Score: 17    End of Session Equipment Utilized During Treatment: Gait belt Activity Tolerance: Patient limited by fatigue Patient left: with call bell/phone within reach;with bed alarm set;in bed Nurse Communication: Mobility status PT Visit Diagnosis: Difficulty in walking, not elsewhere classified (R26.2);Pain     Time: 6349-4944 PT Time Calculation (min) (ACUTE ONLY): 34 min  Charges:  $Gait Training:  8-22 mins $Therapeutic Activity: 8-22 mins                     Rica Koyanagi  PTA Acute  Rehabilitation Services Pager      [phone] Office      [phone]

## 2020-07-19 NOTE — Progress Notes (Addendum)
HEMATOLOGY-ONCOLOGY PROGRESS NOTE  SUBJECTIVE:  Kathy Irwin was seen for hematology follow-up today.  No bleeding reported.  Platelet count is drifting back down again.  Right hip pain remains overall stable.  No fevers.  Breathing has improved with diuresis. Notes no other acute new symptoms.  Good p.o. intake.  REVIEW OF SYSTEMS:   Constitutional: Denies fevers and chills Eyes: Denies blurriness of vision Ears, nose, mouth, throat, and face: Denies mucositis or sore throat Respiratory: Denies cough, dyspnea or wheezes Cardiovascular: Denies palpitation, chest discomfort Gastrointestinal:  Denies nausea, heartburn or change in bowel habits Skin: Denies abnormal skin rashes Lymphatics: Denies new lymphadenopathy or easy bruising Neurological:Denies numbness, tingling or new weaknesses Behavioral/Psych: Mood is stable, no new changes  Extremities: No lower extremity edema All other systems were reviewed with the patient and are negative.  I have reviewed the past medical history, past surgical history, social history and family history with the patient and they are unchanged from previous note.   PHYSICAL EXAMINATION: ECOG PERFORMANCE STATUS: 2 - Symptomatic, <50% confined to bed  Vitals:   07/18/20 2152 07/19/20 0519  BP: 138/68 (!) 111/53  Pulse: 84 78  Resp: 17 18  Temp: 98 F (36.7 C) 98 F (36.7 C)  SpO2: 98% 93%   Filed Weights   07/18/20 0500 07/18/20 0700 07/19/20 0500  Weight: 103.5 kg 103.5 kg 103.7 kg    Intake/Output from previous day: 04/03 0701 - 04/04 0700 In: 1080 [P.O.:1080] Out: 1300 [Urine:1300]  GENERAL:alert, no distress and comfortable SKIN: skin color, texture, turgor are normal, no rashes or significant lesions EYES: normal, Conjunctiva are pink and non-injected, sclera clear OROPHARYNX:no exudate, no erythema and lips, buccal mucosa, and tongue normal  NECK: supple, thyroid normal size, non-tender, without nodularity LYMPH:  no palpable  lymphadenopathy in the cervical, axillary or inguinal LUNGS: clear to auscultation and percussion with normal breathing effort HEART: regular rate & rhythm and no murmurs and no lower extremity edema ABDOMEN:abdomen soft, non-tender and normal bowel sounds Musculoskeletal:no cyanosis of digits and no clubbing  NEURO: alert & oriented x 3 with fluent speech, no focal motor/sensory deficits  LABORATORY DATA:  I have reviewed the data as listed CMP Latest Ref Rng & Units 07/19/2020 07/18/2020 07/17/2020  Glucose 70 - 99 mg/dL 94 88 100(H)  BUN 8 - 23 mg/dL _0 Creatinine 0.44 - 1.00 mg/dL 0.60 0.73 0.64  Sodium 135 - 145 mmol/L 135 134(L) 139  Potassium 3.5 - 5.1 mmol/L 3.8 3.6 4.0  Chloride 98 - 111 mmol/L 102 100 103  CO2 22 - 32 mmol/L _1 Calcium 8.9 - 10.3 mg/dL 8.9 8.9 9.3  Total Protein 6.5 - 8.1 g/dL - - -  Total Bilirubin 0.3 - 1.2 mg/dL - - -  Alkaline Phos 38 - 126 U/L - - -  AST 15 - 41 U/L - - -  ALT 0 - 44 U/L - - -    Lab Results  Component Value Date   WBC 9.3 07/19/2020   HGB 7.8 (L) 07/19/2020   HCT 25.2 (L) 07/19/2020   MCV 96.9 07/19/2020   PLT 24 (LL) 07/19/2020   NEUTROABS 1.4 (L) 07/19/2020    DG Chest 2 View  Result Date: 07/14/2020 CLINICAL DATA:  Concern for mediastinal widening on chest x-ray obtained earlier today. Hypoxia. EXAM: CHEST - 2 VIEW COMPARISON:  Portable AP view earlier today.  Chest CT 05/02/2020. FINDINGS: Borderline cardiomegaly. Mediastinal widening on prior exam is related to  aortic tortuosity. Similar mediastinal appearance seen on prior exams. Mild background interstitial coarsening, with slight patchy opacity at the lung bases. No pneumothorax or significant pleural effusion. No acute osseous abnormalities are seen. IMPRESSION: 1. The questioned mediastinal widening on prior exam is related to tortuous thoracic aorta. 2. Mild background interstitial coarsening with slight patchy opacity at the lung bases, may be atelectasis or  pulmonary edema. There may be underlying interstitial lung disease, that is not well assessed on the current exam. Electronically Signed   By: Keith Rake M.D.   On: 07/14/2020 19:11   CT HIP RIGHT WO CONTRAST  Result Date: 06/23/2020 CLINICAL DATA:  Recent right hip ORIF for intertrochanteric fracture presenting with low platelets and anemia. EXAM: CT OF THE RIGHT HIP WITHOUT CONTRAST TECHNIQUE: Multidetector CT imaging of the right hip was performed according to the standard protocol. Multiplanar CT image reconstructions were also generated. COMPARISON:  Radiographs 04/30/2020 FINDINGS: Bones/Joint/Cartilage Redemonstration of the a highly comminuted inter trochanteric right femur fracture post intramedullary nail placement with transcervical pinning. There is some developing callus formation about the numerous fracture fragments. Some low-intermediate attenuation surrounding the fracture fragments likely reflects a small amount of intramedullary hematoma with additional seroma in stranding extending to the skin surface of the lateral soft tissues of hip. No acute hardware complication or failure is seen otherwise. There is a background of severe degenerative changes in the left hip including larger geode formation along the articular surface of the femoral head as well as extensive periarticular spurring about the acetabulum. No acute fracture of the included bones of the pelvis is seen. Ligaments Suboptimally assessed by CT. Muscles and Tendons Intramuscular stranding and thickening a seen along the surgical approach the patient's intramedullary nail placement. Small of intramuscular thickening and possible hematoma/seroma subjacent to the iliotibial band. No frankly or retracted tendons nor other acute musculotendinous injury. Soft tissues Soft tissue swelling and edematous changes of the right lower extremity most pronounced laterally with scar stranding and scarring related to the surgical approach.  No soft tissue gas or unexpected foreign body is seen. Included portions of the pelvis are free of acute abnormality. IMPRESSION: 1. Redemonstration of the highly comminuted inter trochanteric right femur fracture post intramedullary nail placement with transcervical pinning. Developing callus formation about the numerous fracture fragments. Some minimal low-intermediate attenuation surrounding the fracture fragments likely reflects only a small amount residual hematoma or seroma with additional scarring and thickening extending to the skin surface of the lateral soft tissues of the hip. 2. No other acute osseous injury or traumatic malalignment. No other large collection or hematoma is seen. 3. Background of severe degenerative changes in the left hip. Electronically Signed   By: Lovena Le M.D.   On: 06/23/2020 22:21   CT HIP RIGHT W CONTRAST  Result Date: 07/07/2020 CLINICAL DATA:  Thrombocytopenia, right subtrochanteric fracture. Draining wound for 4 weeks. EXAM: CT OF THE LOWER RIGHT EXTREMITY WITH CONTRAST TECHNIQUE: Multidetector CT imaging of the lower right extremity was performed according to the standard protocol following intravenous contrast administration. CONTRAST:  193m OMNIPAQUE IOHEXOL 300 MG/ML  SOLN COMPARISON:  05/10/2020 FINDINGS: Bones/Joint/Cartilage Ununited comminuted right intertrochanteric fracture transfixed with a intramedullary nail and interlocking femoral neck screws and mild callus formation across the bone fragments. Persistent low-attenuation material around the fracture fragments may reflect residual hemorrhagic material, but infection cannot be excluded. With a soft tissue track extending towards the skin surface beyond the field of view. No drainable fluid collection. No acute hardware  failure or complication. 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 Normal alignment. No joint effusion. Ligaments Ligaments are suboptimally evaluated by CT. Muscles and Tendons  Muscles are normal.  No muscle atrophy. Soft tissue No fluid collection or hematoma.  No soft tissue mass. IMPRESSION: 1. Ununited comminuted right intertrochanteric fracture transfixed with a intramedullary nail and interlocking femoral neck screws and mild callus formation across the bone fragments. Persistent low-attenuation material around the fracture fragments may reflect residual hemorrhagic material, but infection cannot be excluded. With a soft tissue track extending towards the skin surface beyond the field of view. No drainable fluid collection. If there is persistent clinical concern regarding soft tissue infection extending to the fracture site, an MRI of the right hip utilizing mars protocol may be helpful for characterization. Electronically Signed   By: Kathreen Devoid   On: 07/07/2020 10:35   CT BIOPSY  Result Date: 07/07/2020 INDICATION: Thrombocytopenia, history of CML EXAM: CT GUIDED RIGHT ILIAC BONE MARROW ASPIRATION AND CORE BIOPSY Date:  07/07/2020 07/07/2020 10:21 am Radiologist:  M. Daryll Brod, MD Guidance:  CT FLUOROSCOPY TIME:  Fluoroscopy Time: NONE. MEDICATIONS: 1% lidocaine ANESTHESIA/SEDATION: 2.0 mg IV Versed; 100 mcg IV Fentanyl Moderate Sedation Time:  12 minutes The patient was continuously monitored during the procedure by the interventional radiology nurse under my direct supervision. CONTRAST:  138m OMNIPAQUE IOHEXOL 300 MG/ML  SOLN COMPLICATIONS: None PROCEDURE: Informed consent was obtained from the patient following explanation of the procedure, risks, benefits and alternatives. The patient understands, agrees and consents for the procedure. All questions were addressed. A time out was performed. The patient was positioned prone and non-contrast localization CT was performed of the pelvis to demonstrate the iliac marrow spaces. Maximal barrier sterile technique utilized including caps, mask, sterile gowns, sterile gloves, large sterile drape, hand hygiene, and Betadine  prep. Under sterile conditions and local anesthesia, an 11 gauge coaxial bone biopsy needle was advanced into the right iliac marrow space. Needle position was confirmed with CT imaging. Initially, bone marrow aspiration was performed. Next, the 11 gauge outer cannula was utilized to obtain a right iliac bone marrow core biopsy. Needle was removed. Hemostasis was obtained with compression. The patient tolerated the procedure well. Samples were prepared with the cytotechnologist. No immediate complications. IMPRESSION: CT guided right iliac bone marrow aspiration and core biopsy. Electronically Signed   By: MJerilynn Mages  Shick M.D.   On: 07/07/2020 11:46   DG CHEST PORT 1 VIEW  Result Date: 07/14/2020 CLINICAL DATA:  Hypoxia. EXAM: PORTABLE CHEST 1 VIEW COMPARISON:  Chest x-ray 05/10/2020. FINDINGS: One Ng of the upper mediastinum. Although this may be related to prominent great vessels and portable technique, PA and lateral chest x-ray suggested for further evaluation. If mediastinal prominence persist on PA lateral chest x-ray, contrast-enhanced chest CT suggested to further evaluate. Heart size stable. Bilateral interstitial prominence. Interstitial edema and/or pneumonitis could present in this fashion. No pleural effusion or pneumothorax. IMPRESSION: 1. Widening of the upper mediastinum. Although this may be related to prominent great vessels and portable technique, PA and lateral chest x-ray suggested for further evaluation. If mediastinal prominence persists on PA and lateral chest x-ray, contrast-enhanced chest CT suggested to further evaluate. 2. Heart size stable. Bilateral interstitial prominence. Interstitial edema and/or pneumonitis could present in this fashion. Electronically Signed   By: TMarcello Moores Register   On: 07/14/2020 15:18   CT BONE MARROW BIOPSY & ASPIRATION  Result Date: 07/07/2020 INDICATION: Thrombocytopenia, history of CML EXAM: CT GUIDED RIGHT ILIAC BONE  MARROW ASPIRATION AND CORE BIOPSY Date:   07/07/2020 07/07/2020 10:21 am Radiologist:  M. Daryll Brod, MD Guidance:  CT FLUOROSCOPY TIME:  Fluoroscopy Time: NONE. MEDICATIONS: 1% lidocaine ANESTHESIA/SEDATION: 2.0 mg IV Versed; 100 mcg IV Fentanyl Moderate Sedation Time:  12 minutes The patient was continuously monitored during the procedure by the interventional radiology nurse under my direct supervision. CONTRAST:  184m OMNIPAQUE IOHEXOL 300 MG/ML  SOLN COMPLICATIONS: None PROCEDURE: Informed consent was obtained from the patient following explanation of the procedure, risks, benefits and alternatives. The patient understands, agrees and consents for the procedure. All questions were addressed. A time out was performed. The patient was positioned prone and non-contrast localization CT was performed of the pelvis to demonstrate the iliac marrow spaces. Maximal barrier sterile technique utilized including caps, mask, sterile gowns, sterile gloves, large sterile drape, hand hygiene, and Betadine prep. Under sterile conditions and local anesthesia, an 11 gauge coaxial bone biopsy needle was advanced into the right iliac marrow space. Needle position was confirmed with CT imaging. Initially, bone marrow aspiration was performed. Next, the 11 gauge outer cannula was utilized to obtain a right iliac bone marrow core biopsy. Needle was removed. Hemostasis was obtained with compression. The patient tolerated the procedure well. Samples were prepared with the cytotechnologist. No immediate complications. IMPRESSION: CT guided right iliac bone marrow aspiration and core biopsy. Electronically Signed   By: MJerilynn Mages  Shick M.D.   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 ITP is likely since platelets improved significantly with recent steroid use. Additional factors worsening her platelet count are infection in  her right hip surgical site with drainage and absorption of platelets, multiple lines of antibiotics etc. 3) Macrocytic anemia -possibly from blood loss with surgery and ongoing drainage.  Could also be from CMML.    PLAN:  -CBC from today has been reviewed.  Hemoglobin overall stable today at 7.8 and platelets are drifted down to 24,000.  She does not have any bleeding today.  We will go ahead and administer Nplate 5 mcg/kg today with the hopes of getting a slight bump in her platelets.  From our standpoint, the patient may discharged home regardless of platelet count as long as she is not bleeding.  She will be monitored very closely in our office and we can transfuse platelets if needed. -Patient had bone marrow biopsy performed 07/07/2020 and tolerated the procedure well.  Final pathology report now available which shows features consistent with previously known CMML and peripheral blood shows features suggestive of an element of hemolysis. -The lack of adequate response to IVIG and Nplate at this time suggests her thrombocytopenia is is likely not completely related to ITP alone but is also related to her CMML. -The patient has been seen by infectious disease and orthopedics regarding definitive plan for her right hip wound.  Both feel that the area is not infected and antibiotics and I&D are not indicated at this time.  They both plan for outpatient follow-up. -I again discussed with the patient today that she will need treatment for CMML in the form of Vidaza.  This will be scheduled as an outpatient.  From our standpoint, the patient may be discharged home regardless of platelet count as long as she is not actively bleeding.  She will be monitored very closely in our office and given Nplate  weekly and transfused as needed.  We do not plan to initiate chemotherapy in the hospital.  I have sent a scheduling message to arrange for a 1 week follow-up for the patient.   Mikey Bussing 07/19/20     ADDENDUM   .Patient was Personally and independently interviewed, examined and relevant elements of the history of present illness were reviewed in details and an assessment and plan was created. All elements of the patient's history of present illness , assessment and plan were discussed in details with Mikey Bussing DNP. The above documentation reflects our combined findings assessment and plan.  -will need weekly labs , Nplate and prn platelets to maintain PLT>10k. -will try to transition to [facility] as outpatient -will f/u as outpatient to setup Vidaza for rx of CMML -patient agreeable with this plan. No active bleeding at this time.  Sullivan Lone MD MS

## 2020-07-19 NOTE — Telephone Encounter (Signed)
Scheduled per 4/4 sch msg. Pt will receive next appts per DC notes

## 2020-07-19 NOTE — TOC Progression Note (Signed)
Transition of Care [facility]) - Progression Note    Patient Details  Name: Kathy Irwin MRN: [id number] Date of Birth: 07-25-1950  Transition of Care [facility]) CM/SW Contact  Maragret Vanacker, Marjie Skiff, RN Phone Number: 07/19/2020, 3:41 PM  Clinical Narrative:     Spoke with pt at bedside to discuss PT recommendation change to SNF. Pt states that she has been to SNF before and does not want to go back. She states that her children will care for her 24hrs/day at home. Will continue with plan for home health services through [facility] as previously set up.   Expected Discharge Plan: [place] Barriers to Discharge: Continued Medical Work up  Expected Discharge Plan and Services Expected Discharge Plan: [place] Acute Care Choice: Resumption of Svcs/PTA Provider          HH Arranged: PT [place]: [place] Date [place]: 07/13/20 Time HH Agency Contacted: 1215 Representative spoke with at [place]: [place] ([place]) Interventions    Readmission Risk Interventions Readmission Risk Prevention Plan 08/12/2019  Transportation Screening Complete  PCP or Specialist Appt within 3-5 Days Complete  HRI or [place] Complete  Social Work Consult for [place] Planning/Counseling Complete  Palliative Care Screening Not Applicable  Medication Review [employer]) Complete  Some recent data might be hidden

## 2020-07-19 NOTE — Progress Notes (Signed)
PROGRESS NOTE    Kathy Irwin  QMV:[id number] DOB: 1950-11-17 DOA: 07/05/2020 PCP: Kerin Perna, NP    No chief complaint on file.   Brief Narrative:  Kathy Irwin a 70 y.o.femalewith medical history significant ofchronic myelomonocytic leukemia, chronic diastolic heart failure, chronic anemia, diabetes mellitus type 2, hyperlipidemia, hypersensitivity pneumonitis, interstitial lung disease, GERD, thrombocytopenia.Patient presented from oncology office for acute on chronic low platelets it was thought to be related to ITP.     Assessment & Plan:   Principal Problem:   Thrombocytopenia ([place]) Active Problems:   Hypoxia   Hypersensitivity pneumonitis (HCC)   Obesity, Class III, BMI 40-49.9 (morbid obesity) ([place])   Essential hypertension   Postoperative wound infection of right hip   Hyperbilirubinemia   Idiopathic thrombocytopenia (HCC)   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[place])   Counseling regarding advance care planning and goals of care   Pyogenic arthritis of right hip (HCC)   Symptomatic anemia   1 ITP -Baseline platelets around 50-60,000. -Patient being followed by Dr. Irene Limbo in the outpatient setting. -On admission platelets noted to be at 17,000 from 44,000 days prior to admission. -Status post 2 doses of Nplate and IVIG,  platelets trending down and currently at 24,000. -Status post bone marrow biopsy 07/07/2020 with features consistent with previously known CMML-1. -Per oncology note lack of adequate response to IVIG and Nplate suggest thrombocytopenia is not completely related to ITP alone but could also be related to her CMML. -Per oncology platelet transfusion for platelets <10,000 or if acute bleeding. -Platelet count has continuously been trending down over the past few days. -Oncology following and Nplate ordered today. -Per hematology/oncology.  2.  Right hip infection status post IM nail 04/30/2020 -Patient noted to have developed a  postop infection of the right hip with plans for irrigation and debridement which was canceled due to significant thrombocytopenia and lack of drainage from right hip on day of surgery. -Patient seen in consultation by ID while recommending to monitor off antibiotics. -CT right hip with no significant fluid collection. -ID reconsulted and patient seen by Dr. Gale Journey 07/13/2020 who are recommending evaluation by surgery/orthopedic and review of films, and if no consensus is reached and patient is discharged outpatient follow-up with ID. -Patient seen by orthopedics, patient assessed by PA and it had discussions with Dr. Doreatha Martin, current recommendations are as no drainable fluid collection noted on CT scan, no increased drainage while being off antibiotics, orthopedics feels no formal irrigation and debridement needed at this time and I agree with patient remaining off antibiotics with close follow-up.  Per orthopedics additionally platelets will need to be > 50,000 prior to any procedure if surgery were to be needed. -Outpatient follow-up with orthopedics.  3.  Hypoxia/shortness of breath secondary to acute diastolic CHF exacerbation -Patient noted to be visibly short of breath on 07/14/2020 and 07/16/2020.. -Patient was working with PT and noted to have sats of 78% on room air with activity with sats going greater than 90% on 4 L with patient inability to tolerate ambulation in the hallway. -Patient was likely volume overloaded and improved clinically with diuretics.  -Chest x-ray done with widening of upper mediastinum, bilateral interstitial prominence concern for interstitial edema and/or pneumonitis.   -Two-view chest x-ray obtained with tortuous thoracic aorta, mild background interstitial coarsening and slight patchy opacity in the lung bases may be atelectasis or pulmonary edema.   -Urine output of 1.3 L over the past 24 hours.   -Patient has been transitioned from IV  Lasix to oral Lasix  -Strict I's and  O's.  Daily weights.   4.  Anemia -Status post transfusion 3 units packed red blood cells.  -Hemoglobin stable at 7.8.  -Transfusion threshold hemoglobin < 7.   5.  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 -Discontinued Dulera as patient feels causing her to feel more short of breath and tachycardic.   -Continue Singulair, PPI.   -Outpatient follow-up.    6.  Diabetes mellitus type 2 -Patient noted to have hypoglycemic spells and as such Lantus discontinued. -Hemoglobin A1c 6.7 (04/30/2020) -CBG 94 this morning  -SSI.    7.  Chronic myelomonocytic leukemia -Per oncology.  8.  Acute on chronic diastolic heart failure/hypertension -Clinical improvement.   -Patient noted have to have been off home regimen of oral diuretics early on during the hospitalization.   -Was on Lasix IV every 12 hours with good urine output. -Lasix transition from IV to oral Lasix 40 mg daily.  -Urine output of 1.3 L over the past 24 hours -Patient is -10.830 L during this hospitalization.  -Current weight of 103.7 kg from 108.9 kg(07/05/2020). [name] has been discontinued.  -Continue Toprol-XL, Lasix 40 mg daily.   -Strict I's and O's, daily weights.  9.  Hyperlipidemia -Statin.     10.  Gastroesophageal reflux disease -Continue PPI.  11.  Morbid obesity  12.  Hypomagnesemia Repleted.  Magnesium at 1.8.   13.  Constipation -Resolved.   -Continue current bowel regimen Senokot-S twice daily.     DVT prophylaxis: SCDs Code Status: Full Family Communication: Updated patient.  No family at bedside. Disposition:   Status is: Inpatient    Dispo: The patient is from: Home              Anticipated d/c is to: Home with home health services              Patient currently being treated for thrombocytopenia, platelet count trending down.  Not stable for discharge.   Difficult to place  patient no       Consultants:   Interventional radiology: Dr. Shelton Silvas 07/06/2020  Infectious disease: Dr. Baxter Flattery 07/06/2020  Orthopedics: Dr. Doreatha Martin 07/14/2020  ID: Dr. Gale Journey 07/13/2020  Procedures:   CT right hip/right lower extremity 07/07/2020  CT bone marrow biopsy 07/07/2020 --per IR, Dr. Annamaria Boots  Chest x-ray 07/14/2020  Transfusion 3 units packed red blood cells (3/21. 3/25).  Antimicrobials:  Anti-infectives (From admission, onward)   None       Subjective: Laying in bed.  States shortness of breath has improved.  Denies any chest pain no abdominal pain.  No bleeding.  Wondering why platelet count is still low.  Objective: Vitals:   07/18/20 0700 07/18/20 2152 07/19/20 0500 07/19/20 0519  BP:  138/68  (!) 111/53  Pulse:  84  78  Resp:  17  18  Temp:  98 F (36.7 C)  98 F (36.7 C)  TempSrc:  Oral  Oral  SpO2:  98%  93%  Weight: 103.5 kg  103.7 kg   Height:        Intake/Output Summary (Last 24 hours) at 07/19/2020 1001 Last data filed at 07/19/2020 0550 Gross per 24 hour  Intake 600 ml  Output 1000 ml  Net -400 ml   Filed Weights   07/18/20 0500 07/18/20 0700 07/19/20 0500  Weight: 103.5 kg 103.5 kg 103.7  kg    Examination:  General exam: NAD Respiratory system: Decreased breath sounds in the bases otherwise clear.  No wheezing.  Fair air movement. Cardiovascular system: Regular rate rhythm no murmurs rubs or gallops.  Trace bilateral lower extremity edema.  Gastrointestinal system: Abdomen is soft, obese, nontender, nondistended, positive bowel sounds.  No rebound.  No guarding.  Central nervous system: Alert and oriented.  No focal neurological deficits.   Extremities: Dressing noted over posterior lateral right hip with incision site C/D/I, no drainage noted.   Skin: No rashes, lesions or ulcers Psychiatry: Judgement and insight appear normal. Mood & affect appropriate.     Data Reviewed: I have personally reviewed following labs and imaging  studies  CBC: Recent Labs  Lab 07/15/20 0535 07/16/20 0512 07/17/20 0540 07/18/20 0825 07/19/20 0514  WBC 6.4 6.6 7.7 8.5 9.3  NEUTROABS  --  1.0*  --  1.3* 1.4*  HGB 7.9* 7.9* 7.9* 8.3* 7.8*  HCT 26.4* 25.9* 26.0* 26.7* 25.2*  MCV 100.4* 99.6 98.1 94.3 96.9  PLT 45* 41* 35* 26* 24*    Basic Metabolic Panel: Recent Labs  Lab 07/15/20 0535 07/16/20 0512 07/17/20 0540 07/18/20 0512 07/18/20 0825 07/19/20 0514  NA 140 137 139  --  134* 135  K 3.5 3.7 4.0  --  3.6 3.8  CL 103 103 103  --  100 102  CO2 _0 --  29 26  GLUCOSE 113* 105* 100*  --  88 94  BUN _1 --  18 19  CREATININE 0.53 0.47 0.64  --  0.73 0.60  CALCIUM 9.0 9.2 9.3  --  8.9 8.9  MG  --  1.6* 1.8 2.0  --  1.8    GFR: Estimated Creatinine Clearance: 76.8 mL/min (by C-G formula based on SCr of 0.6 mg/dL).  Liver Function Tests: Recent Labs  Lab 07/13/20 1244  AST 23  ALT 15  ALKPHOS 68  BILITOT 1.7*  PROT 7.1  ALBUMIN 2.8*    CBG: Recent Labs  Lab 07/18/20 0725 07/18/20 1147 07/18/20 1647 07/18/20 2154 07/19/20 0744  GLUCAP 89 121* 96 98 94     No results found for this or any previous visit (from the past 240 hour(s)).       Radiology Studies: No results found.      Scheduled Meds: . cholecalciferol  5,000 Units Oral Daily  . famotidine  40 mg Oral Daily  . furosemide  40 mg Oral Daily  . insulin aspart  0-15 Units Subcutaneous TID WC  . linaclotide  145 mcg Oral QAC breakfast  . metoprolol succinate  25 mg Oral Daily  . metoprolol succinate  50 mg Oral Daily  . montelukast  10 mg Oral QHS  . nystatin   Topical TID  . olopatadine  1 drop Both Eyes BID  . pantoprazole  40 mg Oral Daily  . pravastatin  40 mg Oral QHS  . romiPLOStim  5 mcg/kg Subcutaneous Weekly  . senna-docusate  1 tablet Oral BID  . sertraline  100 mg Oral QHS   Continuous Infusions:    LOS: 12 days    Time spent: 35 minutes    [name], MD [facility]   To  contact the attending provider between 7A-7P or the covering provider during after hours 7P-7A, please log into the web site [email or web address] and access using universal [place] password for that web site. If you do not have the password, please call  the hospital operator.  07/19/2020, 10:01 AM

## 2020-07-19 NOTE — Progress Notes (Addendum)
   07/19/20 0930  Notify: Provider  Provider Name/Title Dr. Grandville Silos  Date Provider Notified 07/19/20  Time Provider Notified 0930  Notification Type Face-to-face  Notification Reason Critical result  Provider response Other (Comment)   [id number]

## 2020-07-20 ENCOUNTER — Inpatient Hospital Stay: Payer: 59

## 2020-07-20 DIAGNOSIS — C9310 Chronic myelomonocytic leukemia not having achieved remission: Secondary | ICD-10-CM | POA: Diagnosis not present

## 2020-07-20 DIAGNOSIS — J679 Hypersensitivity pneumonitis due to unspecified organic dust: Secondary | ICD-10-CM | POA: Diagnosis not present

## 2020-07-20 DIAGNOSIS — D696 Thrombocytopenia, unspecified: Secondary | ICD-10-CM | POA: Diagnosis not present

## 2020-07-20 DIAGNOSIS — D693 Immune thrombocytopenic purpura: Secondary | ICD-10-CM | POA: Diagnosis not present

## 2020-07-20 LAB — CBC WITH DIFFERENTIAL/PLATELET
Abs Immature Granulocytes: 0.91 10*3/uL — ABNORMAL HIGH (ref 0.00–0.07)
Basophils Absolute: 0 10*3/uL (ref 0.0–0.1)
Basophils Relative: 0 %
Eosinophils Absolute: 0 10*3/uL (ref 0.0–0.5)
Eosinophils Relative: 0 %
HCT: 23.6 % — ABNORMAL LOW (ref 36.0–46.0)
Hemoglobin: 7.4 g/dL — ABNORMAL LOW (ref 12.0–15.0)
Immature Granulocytes: 9 %
Lymphocytes Relative: 32 %
Lymphs Abs: 3.3 10*3/uL (ref 0.7–4.0)
MCH: 29.7 pg (ref 26.0–34.0)
MCHC: 31.4 g/dL (ref 30.0–36.0)
MCV: 94.8 fL (ref 80.0–100.0)
Monocytes Absolute: 4.5 10*3/uL — ABNORMAL HIGH (ref 0.1–1.0)
Monocytes Relative: 43 %
Neutro Abs: 1.6 10*3/uL — ABNORMAL LOW (ref 1.7–7.7)
Neutrophils Relative %: 16 %
Platelets: 27 10*3/uL — CL (ref 150–400)
RBC: 2.49 MIL/uL — ABNORMAL LOW (ref 3.87–5.11)
RDW: 20.6 % — ABNORMAL HIGH (ref 11.5–15.5)
WBC: 10.3 10*3/uL (ref 4.0–10.5)
nRBC: 8.5 % — ABNORMAL HIGH (ref 0.0–0.2)

## 2020-07-20 LAB — BASIC METABOLIC PANEL
Anion gap: 7 (ref 5–15)
BUN: 15 mg/dL (ref 8–23)
CO2: 28 mmol/L (ref 22–32)
Calcium: 9.2 mg/dL (ref 8.9–10.3)
Chloride: 101 mmol/L (ref 98–111)
Creatinine, Ser: 0.65 mg/dL (ref 0.44–1.00)
GFR, Estimated: >60 mL/min (ref >60)
Glucose, Bld: 98 mg/dL (ref 70–99)
Potassium: 4.1 mmol/L (ref 3.5–5.1)
Sodium: 136 mmol/L (ref 135–145)

## 2020-07-20 LAB — DIRECT ANTIGLOBULIN TEST (NOT AT ARMC)
DAT, IgG: NEGATIVE
DAT, complement: NEGATIVE

## 2020-07-20 LAB — GLUCOSE, CAPILLARY
Glucose-Capillary: 82 mg/dL (ref 70–99)
Glucose-Capillary: 149 mg/dL — ABNORMAL HIGH (ref 70–99)
Glucose-Capillary: 101 mg/dL — ABNORMAL HIGH (ref 70–99)
Glucose-Capillary: 134 mg/dL — ABNORMAL HIGH (ref 70–99)

## 2020-07-20 LAB — RETICULOCYTES
Immature Retic Fract: 35.9 % — ABNORMAL HIGH (ref 2.3–15.9)
RBC.: 2.86 MIL/uL — ABNORMAL LOW (ref 3.87–5.11)
Retic Count, Absolute: 48.3 10*3/uL (ref 19.0–186.0)
Retic Ct Pct: 1.7 % (ref 0.4–3.1)

## 2020-07-20 LAB — BILIRUBIN, FRACTIONATED(TOT/DIR/INDIR)
Bilirubin, Direct: 0.4 mg/dL — ABNORMAL HIGH (ref 0.0–0.2)
Indirect Bilirubin: 1.4 mg/dL — ABNORMAL HIGH (ref 0.3–0.9)
Total Bilirubin: 1.8 mg/dL — ABNORMAL HIGH (ref 0.3–1.2)

## 2020-07-20 LAB — PREPARE RBC (CROSSMATCH)

## 2020-07-20 LAB — LACTATE DEHYDROGENASE: LDH: 650 U/L — ABNORMAL HIGH (ref 98–192)

## 2020-07-20 LAB — MAGNESIUM: Magnesium: 1.7 mg/dL (ref 1.7–2.4)

## 2020-07-20 MED ORDER — SODIUM CHLORIDE 0.9% IV SOLUTION: Freq: Once | INTRAVENOUS | Status: AC

## 2020-07-20 MED ORDER — MAGNESIUM SULFATE 4 GM/100ML IV SOLN
4.0000 g | Freq: Once | INTRAVENOUS | Status: AC
  Administered 2020-07-20: 4 g via INTRAVENOUS
  Filled 2020-07-20: qty 100

## 2020-07-20 MED ORDER — ACETAMINOPHEN 325 MG PO TABS
650.0000 mg | ORAL_TABLET | Freq: Once | ORAL | Status: AC
  Administered 2020-07-20: 650 mg via ORAL
  Filled 2020-07-20: qty 2

## 2020-07-20 MED ORDER — FUROSEMIDE 10 MG/ML IJ SOLN
20.0000 mg | Freq: Once | INTRAMUSCULAR | Status: AC
  Administered 2020-07-20: 20 mg via INTRAVENOUS
  Filled 2020-07-20: qty 2

## 2020-07-20 MED ORDER — DIPHENHYDRAMINE HCL 25 MG PO CAPS
25.0000 mg | ORAL_CAPSULE | Freq: Once | ORAL | Status: AC
  Administered 2020-07-20: 25 mg via ORAL
  Filled 2020-07-20: qty 1

## 2020-07-20 NOTE — Progress Notes (Signed)
SATURATION QUALIFICATIONS: (This note is used to comply with regulatory documentation for home oxygen)  Patient Saturations on Room Air at Rest = 95%  Patient Saturations on Room Air while Ambulating = 84%  Patient Saturations on 3 Liters of oxygen while Ambulating = 92%  Please briefly explain why patient needs home oxygen:

## 2020-07-20 NOTE — Progress Notes (Signed)
PROGRESS NOTE    Kathy Irwin  DPO:[id number] DOB: 05-04-1950 DOA: 07/05/2020 PCP: Kerin Perna, NP    No chief complaint on file.   Brief Narrative:  Kathy Irwin a 70 y.o.femalewith medical history significant ofchronic myelomonocytic leukemia, chronic diastolic heart failure, chronic anemia, diabetes mellitus type 2, hyperlipidemia, hypersensitivity pneumonitis, interstitial lung disease, GERD, thrombocytopenia.Patient presented from oncology office for acute on chronic low platelets it was thought to be related to ITP.     Assessment & Plan:   Principal Problem:   Thrombocytopenia ([place]) Active Problems:   Hypoxia   Hypersensitivity pneumonitis (HCC)   Obesity, Class III, BMI 40-49.9 (morbid obesity) ([place])   Essential hypertension   Postoperative wound infection of right hip   Hyperbilirubinemia   Idiopathic thrombocytopenia (HCC)   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[place])   Counseling regarding advance care planning and goals of care   Pyogenic arthritis of right hip (HCC)   Symptomatic anemia   1 ITP -Baseline platelets around 50-60,000. -Patient being followed by Dr. Irene Limbo in the outpatient setting. -On admission platelets noted to be at 17,000 from 44,000 days prior to admission. -Status post 2 doses of Nplate and IVIG,  platelets trending down and currently at 27,000. -Status post bone marrow biopsy 07/07/2020 with features consistent with previously known CMML-1. -Per oncology note lack of adequate response to IVIG and Nplate suggest thrombocytopenia is not completely related to ITP alone but could also be related to her CMML. -Per oncology platelet transfusion for platelets <10,000 or if acute bleeding. -Platelet count has continuously been trending down over the past few days. -Status post Nplate (4/4) with platelet count currently at 27,000. -Hematology/oncology recommending transfusion of packed red blood cells to keep hemoglobin > 8.   -Transfuse 1 unit packed red blood cells -Per hematology/oncology.  2.  Right hip infection status post IM nail 04/30/2020 -Patient noted to have developed a postop infection of the right hip with plans for irrigation and debridement which was canceled due to significant thrombocytopenia and lack of drainage from right hip on day of surgery. -Patient seen in consultation by ID while recommending to monitor off antibiotics. -CT right hip with no significant fluid collection. -ID reconsulted and patient seen by Dr. Gale Journey 07/13/2020 who are recommending evaluation by surgery/orthopedic and review of films, and if no consensus is reached and patient is discharged outpatient follow-up with ID. -Patient seen by orthopedics, patient assessed by PA and it had discussions with Dr. Doreatha Martin, current recommendations are as no drainable fluid collection noted on CT scan, no increased drainage while being off antibiotics, orthopedics feels no formal irrigation and debridement needed at this time and I agree with patient remaining off antibiotics with close follow-up.  Per orthopedics additionally platelets will need to be > 50,000 prior to any procedure if surgery were to be needed. -Outpatient follow-up with orthopedics.  3.  Hypoxia/shortness of breath secondary to acute diastolic CHF exacerbation -Patient noted to be visibly short of breath on 07/14/2020 and 07/16/2020.. -Patient was working with PT and noted to have sats of 78% on room air with activity with sats going greater than 90% on 4 L with patient inability to tolerate ambulation in the hallway. -Patient was likely volume overloaded and improved clinically with diuretics.  -Chest x-ray done with widening of upper mediastinum, bilateral interstitial prominence concern for interstitial edema and/or pneumonitis.   -Two-view chest x-ray obtained with tortuous thoracic aorta, mild background interstitial coarsening and slight patchy opacity in the lung bases may  be atelectasis or pulmonary edema.   -Urine output 700 cc recorded over the past 24 hours, however inaccurate.   -Continue oral Lasix.   -Strict I's and O's, daily weights.    4.  Anemia -Status post transfusion 3 units packed red blood cells.  -Hemoglobin stable at 7.4.   -Oncology recommending transfusion of packed red blood cells to maintain hemoglobin greater than 8.   -Additional labs to rule out warm AIHA ordered per hematology/oncology.   -Transfusion threshold hemoglobin < 8.   5.  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 -Discontinued Dulera as patient feels causing her to feel more short of breath and tachycardic.   -Continue Singulair, PPI.   -Check ambulatory sats. -Outpatient follow-up.    6.  Diabetes mellitus type 2 -Patient noted to have hypoglycemic spells and as such Lantus discontinued. -Hemoglobin A1c 6.7 (04/30/2020) -CBG 82 this morning.   -SSI.  7.  Chronic myelomonocytic leukemia -Per oncology.  8.  Acute on chronic diastolic heart failure/hypertension -Clinical improvement.   -Patient noted have to have been off home regimen of oral diuretics early on during the hospitalization.   -Was on Lasix IV every 12 hours with good urine output. -IV Lasix has been transitioned to oral Lasix 40 mg daily. -Urine output recorded of 700 cc over the past 24 hours.  -Patient is -11.050 L during this hospitalization.   -Current weight of 102.3 kg from 103.7 kg from 108.9 kg(07/05/2020). [name] has been discontinued.  -Continue Toprol-XL, Lasix 40 mg daily.   -Strict I's and O's, daily weights.  9.  Hyperlipidemia -Statin.     10.  Gastroesophageal reflux disease -PPI.   11.  Morbid obesity  12.  Hypomagnesemia -Magnesium of 1.7.  Magnesium sulfate 4 g IV x1 .  13.  Constipation -Resolved.   -Continue current bowel regimen Senokot-S twice daily.       DVT prophylaxis: SCDs Code Status: Full Family Communication: Updated patient.  No family at bedside. Disposition:   Status is: Inpatient    Dispo: The patient is from: Home              Anticipated d/c is to: Home with home health services              Patient currently being treated for thrombocytopenia, being transfused a unit of packed red blood cells due to anemia, platelet count trending down.  Not stable for discharge.   Difficult to place patient no       Consultants:   Interventional radiology: Dr. Shelton Silvas 07/06/2020  Infectious disease: Dr. Baxter Flattery 07/06/2020  Orthopedics: Dr. Doreatha Martin 07/14/2020  ID: Dr. Gale Journey 07/13/2020  Procedures:   CT right hip/right lower extremity 07/07/2020  CT bone marrow biopsy 07/07/2020 --per IR, Dr. Annamaria Boots  Chest x-ray 07/14/2020  Transfusion 3 units packed red blood cells (3/21. 3/25).  Transfusion 1 unit packed red blood cells pending (4/5)  Antimicrobials:  Anti-infectives (From admission, onward)   None       Subjective: Sitting up in bed.  Denies any chest pain.  No shortness of breath.  No bleeding.   Objective: Vitals:   07/19/20 1530 07/19/20 2117 07/20/20 0500 07/20/20 0603  BP: (!) 115/54 (!) 114/59  (!) 104/49  Pulse: 75 88  77  Resp: _0 Temp: 98.2 F (36.8 C) 97.8 F (36.6 C)  98.5 F (36.9  C)  TempSrc:  Oral  Oral  SpO2: 93% 94%  95%  Weight:   102.3 kg   Height:        Intake/Output Summary (Last 24 hours) at 07/20/2020 1034 Last data filed at 07/20/2020 0603 Gross per 24 hour  Intake 480 ml  Output 700 ml  Net -220 ml   Filed Weights   07/18/20 0700 07/19/20 0500 07/20/20 0500  Weight: 103.5 kg 103.7 kg 102.3 kg    Examination:  General exam: NAD Respiratory system: Clear to auscultation bilaterally anterior lung fields.  No wheezes, no crackles, no rhonchi.  Fair air movement.  Speaking in full sentences.  Cardiovascular system: RRR with no murmurs rubs or gallops.  No lower  extremity edema.  Gastrointestinal system: Abdomen is soft, nontender, nondistended, obese, positive bowel sounds.  No rebound.  No guarding.  Central nervous system: Alert and oriented.  No focal neurological deficits.   Extremities: Dressing noted over posterior lateral right hip with incision site C/D/I, no drainage noted.   Skin: No rashes, lesions or ulcers Psychiatry: Judgement and insight appear normal. Mood & affect appropriate.     Data Reviewed: I have personally reviewed following labs and imaging studies  CBC: Recent Labs  Lab 07/16/20 0512 07/17/20 0540 07/18/20 0825 07/19/20 0514 07/20/20 0548  WBC 6.6 7.7 8.5 9.3 10.3  NEUTROABS 1.0*  --  1.3* 1.4* 1.6*  HGB 7.9* 7.9* 8.3* 7.8* 7.4*  HCT 25.9* 26.0* 26.7* 25.2* 23.6*  MCV 99.6 98.1 94.3 96.9 94.8  PLT 41* 35* 26* 24* 27*    Basic Metabolic Panel: Recent Labs  Lab 07/16/20 0512 07/17/20 0540 07/18/20 0512 07/18/20 0825 07/19/20 0514 07/20/20 0548  NA 137 139  --  134* 135 136  K 3.7 4.0  --  3.6 3.8 4.1  CL 103 103  --  100 102 101  CO2 27 30  --  _0 GLUCOSE 105* 100*  --  88 94 98  BUN 11 13  --  _1 CREATININE 0.47 0.64  --  0.73 0.60 0.65  CALCIUM 9.2 9.3  --  8.9 8.9 9.2  MG 1.6* 1.8 2.0  --  1.8 1.7    GFR: Estimated Creatinine Clearance: 76.1 mL/min (by C-G formula based on SCr of 0.65 mg/dL).  Liver Function Tests: Recent Labs  Lab 07/13/20 1244  AST 23  ALT 15  ALKPHOS 68  BILITOT 1.7*  PROT 7.1  ALBUMIN 2.8*    CBG: Recent Labs  Lab 07/19/20 0744 07/19/20 1206 07/19/20 1709 07/19/20 2118 07/20/20 0744  GLUCAP 94 132* 112* 105* 82     No results found for this or any previous visit (from the past 240 hour(s)).       Radiology Studies: No results found.      Scheduled Meds: . sodium chloride   Intravenous Once  . acetaminophen  650 mg Oral Once  . cholecalciferol  5,000 Units Oral Daily  . diphenhydrAMINE  25 mg Oral Once  . famotidine  40 mg  Oral Daily  . furosemide  20 mg Intravenous Once  . furosemide  40 mg Oral Daily  . insulin aspart  0-15 Units Subcutaneous TID WC  . linaclotide  145 mcg Oral QAC breakfast  . metoprolol succinate  25 mg Oral Daily  . metoprolol succinate  50 mg Oral Daily  . montelukast  10 mg Oral QHS  . nystatin   Topical TID  . olopatadine  1  drop Both Eyes BID  . pantoprazole  40 mg Oral Daily  . pravastatin  40 mg Oral QHS  . romiPLOStim  5 mcg/kg Subcutaneous Weekly  . senna-docusate  1 tablet Oral BID  . sertraline  100 mg Oral QHS  . simethicone  160 mg Oral QID   Continuous Infusions: . magnesium sulfate bolus IVPB 4 g (07/20/20 1000)     LOS: 13 days    Time spent: 35 minutes    [name], MD [facility]   To contact the attending provider between 7A-7P or the covering provider during after hours 7P-7A, please log into the web site [email or web address] and access using universal [place] password for that web site. If you do not have the password, please call the hospital operator.  07/20/2020, 10:34 AM

## 2020-07-20 NOTE — Progress Notes (Signed)
Occupational Therapy Treatment Patient Details Name: Kathy Irwin MRN: [id number] DOB: April 17, 1951 Today's Date: 07/20/2020    History of present illness 70 y.o. female with medical history significant of chronic myelomonocytic leukemia, chronic diastolic heart failure, chronic anemia, diabetes mellitus type 2, hyperlipidemia, hypersensitivity pneumonitis, interstitial lung disease, GERD, thrombocytopenia. Recent hip fracture in january 2022.I & D for R hip infection postponed 2* thrombocytopenia.   OT comments  Patient progressing and showed improved ability to stand with unilateral UE support in order to complete standing ADLs with less assistance, compared to previous session.  Pt did desat to 84% on room air during ADLs and required [place] and O2 set to 3L to recover to 92% during PLB and sitting rest.  Patient remains limited by gen weakness and low activity tolerance, along with deficits noted below. Pt continues to demonstrate good rehab potential and would benefit from continued skilled OT to increase safety and independence with ADLs and functional transfers to allow pt to return home safely and reduce caregiver burden and fall risk.   Follow Up Recommendations  Home health OT;Supervision/Assistance - 24 hour    Equipment Recommendations  None recommended by OT    Recommendations for Other Services      Precautions / Restrictions Precautions Precautions: Fall Precaution Comments: monitor sats, HR Restrictions Weight Bearing Restrictions: No       Mobility Bed Mobility               General bed mobility comments: Pt in bathroom with CNA as OT entered room. Patient Response: Cooperative  Transfers Overall transfer level: Needs assistance Equipment used: Rolling walker (2 wheeled)   Sit to Stand: Supervision (x 3 reps from standard commode.)         General transfer comment: Pt ambulated with RW from bathroom ~15' to recliner with supervision, on 3L O2.    Balance  Overall balance assessment: Mild deficits observed, not formally tested   Sitting balance-Leahy Scale: Good     Standing balance support: Single extremity supported Standing balance-Leahy Scale: Fair                             ADL either performed or assessed with clinical judgement   ADL Overall ADL's : Needs assistance/impaired Eating/Feeding: Independent   Grooming: Wash/dry face;Wash/dry hands;Standing;Supervision/safety   Upper Body Bathing: Set up;Sitting Upper Body Bathing Details (indicate cue type and reason): able to perform upper body bathing with set up while seated on commode. Increased time to allow for rest breaks with SpO2 monitored. Lower Body Bathing: Min guard Lower Body Bathing Details (indicate cue type and reason): Min guard assist for washing peri area and buttocks in standing with pt holding bathroom grab bar with RT hand and using LT for all cleaning.  - patient holding onto walker. When asked if she could do it she said no. Reports having assistance with bathing at home from children. Upper Body Dressing : Set up;Sitting Upper Body Dressing Details (indicate cue type and reason): to don anterior  hospital gown. Lower Body Dressing: Minimal assistance;Sitting/lateral leans;Sit to/from stand Lower Body Dressing Details (indicate cue type and reason): min A to initiate threading L LE through mesh underwear, able to thread R LE and don slippers at EOB. supervision level in standing to pull up underwear over hips. Pt educated on use of her own reacher to assist with LE dressing while seated in order to conserve energy. Pt verbalized understanding. Toilet Transfer:  Minimal assistance Toilet Transfer Details (indicate cue type and reason): Pt able to don underwear over feet with setup/supervision while seated on toilet. Due to fatigue, pt required Min As to thread foot through pull up brief that pt prefers to wear on top of underwear. Pt stood x 3 to pull  underwear and brief all the way ovr the hips with need of 2 seated rest breaks. Cues for PLB and SpO2 monitored. Pt down to 84% on RA, therefore added [place] set to 3L with quick recovery using PLB.   Toileting - Clothing Manipulation Details (indicate cue type and reason): Pt had already voidede with CNA present. Not observed.     Functional mobility during ADLs: Supervision/safety;Min guard;Rolling walker       Vision   Vision Assessment?: No apparent visual deficits   Perception     Praxis      Cognition Arousal/Alertness: Awake/alert Behavior During Therapy: WFL for tasks assessed/performed;Flat affect Overall Cognitive Status: Within Functional Limits for tasks assessed                                          Exercises     Shoulder Instructions       General Comments Pt desat to 84% with functional ADL activity.  Up to 92% on 3L via [place] which pt stated is what she uses PRN at home.    Pertinent Vitals/ Pain       Pain Assessment: No/denies pain  Home Living                                          Prior Functioning/Environment              Frequency  Min 2X/week        Progress Toward Goals  OT Goals(current goals can now be found in the care plan section)  Progress towards OT goals: Progressing toward goals  Acute Rehab OT Goals Patient Stated Goal: Pt anticipates d/c home today. OT Goal Formulation: With patient Time For Goal Achievement: 07/20/20 (Will update if pt does not d/c home today as pt anticipates.) Potential to Achieve Goals: Good  Plan Discharge plan remains appropriate    Co-evaluation                 AM-PAC OT "6 Clicks" Daily Activity     Outcome Measure   Help from another person eating meals?: None Help from another person taking care of personal grooming?: A Little Help from another person toileting, which includes using toliet, bedpan, or urinal?: A Little Help from another person  bathing (including washing, rinsing, drying)?: A Little Help from another person to put on and taking off regular upper body clothing?: A Little Help from another person to put on and taking off regular lower body clothing?: A Little 6 Click Score: 19    End of Session Equipment Utilized During Treatment: Oxygen;Rolling walker  OT Visit Diagnosis: Unsteadiness on feet (R26.81);Pain   Activity Tolerance Patient limited by fatigue   Patient Left in chair;with nursing/sitter in room;with chair alarm set;with call bell/phone within reach   Nurse Communication Mobility status        Time: 1101-1131 OT Time Calculation (min): 30 min  Charges: OT General Charges $OT Visit: 1 Visit OT Treatments $  Self Care/Home Management : 8-22 mins $Therapeutic Activity: 8-22 mins  Anderson Malta, OT Acute Rehab Services Office: [phone] 07/20/2020   Julien Girt 07/20/2020, 12:42 PM

## 2020-07-20 NOTE — Progress Notes (Signed)
Hematology Short note  -would recommend transfusion to maintain hgb>8 -additional labs ordered to r/o Warm AIHA.  Sullivan Lone

## 2020-07-20 NOTE — TOC Transition Note (Signed)
Transition of Care [facility]) - CM/SW Discharge Note   Patient Details  Name: Kathy Irwin MRN: [id number] Date of Birth: [birth year]  Transition of Care [facility]) CM/SW Contact:  Lynnell Catalan, RN Phone Number: 07/20/2020, 1:58 PM   Clinical Narrative:     Spoke with pt at bedside about 02 requirements. Per desat screen done by nursing staff pt is requiring 3L 02 with ambulation. Pt states she was already on 3L 02 at home. Pt informed that she will need to have one of her family members to bring her travel 02 tank to the hospital when they come to pick her up for dc. She says she understands.    Barriers to Discharge: Continued Medical Work up   Patient Goals and CMS Choice Patient states their goals for this hospitalization and ongoing recovery are:: to go home with therapy CMS Medicare.gov Compare Post Acute Care list provided to:: Patient Choice offered to / list presented to : Patient  Discharge Plan and Services     Post Acute Care Choice: Resumption of Svcs/PTA Provider                    HH Arranged: PT HH Agency: [place] Date [place]: 07/13/20 Time [place]: 1215 Representative spoke with at [place]: [place] ([place]) Interventions     Readmission Risk Interventions Readmission Risk Prevention Plan 08/12/2019  Transportation Screening Complete  PCP or Specialist Appt within 3-5 Days Complete  HRI or [place] Complete  Social Work Consult for [place] Planning/Counseling Complete  Palliative Care Screening Not Applicable  Medication Review Press photographer) Complete  Some recent data might be hidden

## 2020-07-21 ENCOUNTER — Ambulatory Visit (INDEPENDENT_AMBULATORY_CARE_PROVIDER_SITE_OTHER): Payer: 59 | Admitting: Primary Care

## 2020-07-21 DIAGNOSIS — D696 Thrombocytopenia, unspecified: Secondary | ICD-10-CM | POA: Diagnosis not present

## 2020-07-21 LAB — CBC WITH DIFFERENTIAL/PLATELET
Abs Immature Granulocytes: 0.81 10*3/uL — ABNORMAL HIGH (ref 0.00–0.07)
Basophils Absolute: 0.1 10*3/uL (ref 0.0–0.1)
Basophils Relative: 1 %
Eosinophils Absolute: 0.5 10*3/uL (ref 0.0–0.5)
Eosinophils Relative: 5 %
HCT: 27.7 % — ABNORMAL LOW (ref 36.0–46.0)
Hemoglobin: 8.7 g/dL — ABNORMAL LOW (ref 12.0–15.0)
Immature Granulocytes: 8 %
Lymphocytes Relative: 29 %
Lymphs Abs: 3 10*3/uL (ref 0.7–4.0)
MCH: 29.4 pg (ref 26.0–34.0)
MCHC: 31.4 g/dL (ref 30.0–36.0)
MCV: 93.6 fL (ref 80.0–100.0)
Monocytes Absolute: 4.7 10*3/uL — ABNORMAL HIGH (ref 0.1–1.0)
Monocytes Relative: 45 %
Neutro Abs: 1.2 10*3/uL — ABNORMAL LOW (ref 1.7–7.7)
Neutrophils Relative %: 12 %
Platelets: 30 10*3/uL — ABNORMAL LOW (ref 150–400)
RBC: 2.96 MIL/uL — ABNORMAL LOW (ref 3.87–5.11)
RDW: 22.5 % — ABNORMAL HIGH (ref 11.5–15.5)
WBC: 10.3 10*3/uL (ref 4.0–10.5)
nRBC: 8 % — ABNORMAL HIGH (ref 0.0–0.2)

## 2020-07-21 LAB — BPAM RBC
Blood Product Expiration Date: 202204242359
ISSUE DATE / TIME: 202204051530
Unit Type and Rh: 6200

## 2020-07-21 LAB — BASIC METABOLIC PANEL
Anion gap: 8 (ref 5–15)
BUN: 17 mg/dL (ref 8–23)
CO2: 26 mmol/L (ref 22–32)
Calcium: 8.9 mg/dL (ref 8.9–10.3)
Chloride: 102 mmol/L (ref 98–111)
Creatinine, Ser: 0.71 mg/dL (ref 0.44–1.00)
GFR, Estimated: >60 mL/min (ref >60)
Glucose, Bld: 100 mg/dL — ABNORMAL HIGH (ref 70–99)
Potassium: 4 mmol/L (ref 3.5–5.1)
Sodium: 136 mmol/L (ref 135–145)

## 2020-07-21 LAB — TYPE AND SCREEN
ABO/RH(D): AB POS
Antibody Screen: NEGATIVE
Unit division: 0

## 2020-07-21 LAB — GLUCOSE, CAPILLARY
Glucose-Capillary: 93 mg/dL (ref 70–99)
Glucose-Capillary: 165 mg/dL — ABNORMAL HIGH (ref 70–99)

## 2020-07-21 LAB — MAGNESIUM: Magnesium: 1.8 mg/dL (ref 1.7–2.4)

## 2020-07-21 LAB — HAPTOGLOBIN: Haptoglobin: <10 mg/dL — ABNORMAL LOW (ref 37–355)

## 2020-07-21 MED ORDER — GUAIFENESIN-DM 100-10 MG/5ML PO SYRP: 5.0000 mL | ORAL_SOLUTION | ORAL | 0 refills | Status: DC | PRN

## 2020-07-21 MED ORDER — HYDROCODONE-ACETAMINOPHEN 7.5-325 MG PO TABS: 1.0000 | ORAL_TABLET | Freq: Four times a day (QID) | ORAL | 0 refills | Status: AC | PRN

## 2020-07-21 MED ORDER — SENNOSIDES-DOCUSATE SODIUM 8.6-50 MG PO TABS: 1.0000 | ORAL_TABLET | Freq: Two times a day (BID) | ORAL | 2 refills | Status: DC

## 2020-07-21 NOTE — Discharge Summary (Signed)
Physician Discharge Summary  Kathy Irwin ONG:[id number] DOB: 09-08-1950 DOA: 07/05/2020  PCP: Kathy Perna, NP  Admit date: 07/05/2020 Discharge date: 07/21/2020  Admitted From: Home Discharge disposition: Home with home health services   Code Status: Full Code  Diet Recommendation: Cardiac/diabetic diet  Discharge Diagnosis:   Principal Problem:   Thrombocytopenia ([name]) Active Problems:   Hypoxia   Hypersensitivity pneumonitis (HCC)   Obesity, Class III, BMI 40-49.9 (morbid obesity) ([place])   Essential hypertension   Postoperative wound infection of right hip   Hyperbilirubinemia   Idiopathic thrombocytopenia (HCC)   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[place])   Counseling regarding advance care planning and goals of care   Pyogenic arthritis of right hip (HCC)   Symptomatic anemia  History of Present Illness / Brief narrative:  Kathy Irwin a 70 y.o.femalewith medical history significant ofchronic myelomonocytic leukemia, chronic diastolic heart failure, chronic anemia, diabetes mellitus type 2, hyperlipidemia, hypersensitivity pneumonitis, interstitial lung disease, GERD, thrombocytopenia. 07/05/2020, patient was sent from oncology office for acute drop in platelets which are chronically low due to ITP.  Subjective:  Seen and examined this morning.  [name].  Lying on bed.  Not in distress.  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  Patient follows up with Dr. Irene Limbo as an outpatient. -On admission, platelet was noted to be low at 17,000.  She was given 2 doses of Nplate and IVIG with no significant improvement. -3/23, patient underwent bone marrow biopsy.  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  Per oncology, inadequate response to Nplate and IVIG was probably because of thrombocytopenia primarily  related to CMML and not ITP. -Patient did not require platelet transfusion.  Her last several days, patient's platelets have been stable between 20s and 30s. -Follow-up with hematology as an outpatient. Recent Labs  Lab 07/15/20 0535 07/16/20 0512 07/17/20 0540 07/18/20 0825 07/19/20 0514 07/20/20 0548 07/21/20 0506  PLT 45* 41* 35* 26* 24* 27* 30*   Acute on chronic anemia -Baseline hemoglobin over 10.  Presented with a low hemoglobin of 7.9. -No active bleeding during this hospitalization but hemoglobin went as low as 7.4.  She has received a total of 4 units of PRBC during his hospital stay.  Hemoglobin is now stable. -We will discharge her home to continue to follow-up with hematology as an outpatient. Recent Labs    01/21/20 1146 01/21/20 1147 04/29/20 1205 05/02/20 1435 05/03/20 0428 07/05/20 1443 07/06/20 4401 07/17/20 0540 07/18/20 0825 07/19/20 0514 07/20/20 0548 07/20/20 1133 07/21/20 0506  HGB  --  11.5*   < >  --    < >  --    < > 7.9* 8.3* 7.8* 7.4*  --  8.7*  MCV  --  97.5   < >  --    < >  --    < > 98.1 94.3 96.9 94.8  --  93.6  VITAMINB12  --  639  --   --   --   --   --   --   --   --   --   --   --   FERRITIN 619*  --   --  1,485*  --  792*  --   --   --   --   --   --   --   TIBC 274  --   --   --   --  203*  --   --   --   --   --   --   --  IRON 123  --   --   --   --  78  --   --   --   --   --   --   --   RETICCTPCT  --   --   --   --   --   --   --   --   --   --   --  1.7  --    < > = values in this interval not displayed.   Right hip infection status post IM nail 04/30/2020 -Patient was noted to have developed a postop infection of the right hip with plans for irrigation and debridement by Dr. Doreatha Martin which was canceled due to significant thrombocytopenia and lack of drainage from right hip on day of surgery. -Patient seen in consultation by ID while recommending to monitor off antibiotics. -CT right hip with no significant fluid collection.   Discussed with ID and orthopedics. -No intervention as an inpatient. -Outpatient follow-up with orthopedics.  Acute exacerbation of diastolic CHF Acute on chronic hypoxic respiratory failure History of hypersensitive pneumonitis in 2 -3 lpm O2  --Patient was noted to be visibly short of breath on 07/14/2020 and 07/16/2020.. -Patient was working with PT and noted to have sats of 78% on room air with activity with sats going greater than 90% on 4 L with patient inability to tolerate ambulation in the hallway. -Patient was likely volume overloaded. -Chest x-ray done with widening of upper mediastinum, bilateral interstitial prominence concern for interstitial edema and/or pneumonitis.   -Improved with oral Lasix.  Continue oral Lasix and beta-blocker at discharge -Echo from April 2021 showed EF of 70 to 75% with no wall motion abnormality, moderate elevated pulmonary artery pressure. -Currently on 3 L oxygen by nasal cannula. -Discharge with continued supplemental oxygen. -Continue Albuterol, Flonase, Advair, Singulair,  Diabetes mellitus type 2 -A1c 6.7 in January 2022  -Home meds include Lantus 10 units daily, Metformin 1000 mg twice daily. -Not sure if patient was compliant to his medications.  Currently both are on hold and blood sugar level is consistently less than 200. -I would not resume these at discharge.  Patient needs to continue to monitor blood sugar level at home and resume previous meds gradually. Recent Labs  Lab 07/20/20 1206 07/20/20 1743 07/20/20 2156 07/21/20 0743 07/21/20 1109  GLUCAP 149* 101* 134* 93 165*   Hyperlipidemia -Continue statin.     Gastroesophageal reflux disease -Continue Pepcid and Protonix.    Chronic pain -Continue Norco as needed, Pregabalin 100 mg at bedtime, Zoloft 100 mg at bedtime, trazodone 1 mg at bedtime,  Chronic constipation -Continue Linzess as needed, MiraLAX as needed  Impaired mobility -PT evaluation was obtained.  SNF  recommended.  Patient and family however chose to go home with services.  Home health services ordered.  Wound care: Incision (Closed) 04/30/20 Thigh Right (Active)  Date First Assessed/Time First Assessed: 04/30/20 1453   Location: Thigh  Location Orientation: Right    Assessments 04/30/2020  4:00 PM 07/20/2020  9:15 PM  Dressing Type Liquid skin adhesive;Other (Comment) Foam - Lift dressing to assess site every shift     No Linked orders to display     Wound / Incision (Open or Dehisced) 07/13/20 Non-pressure wound Lumbar Right Open, small, round (Active)  Date First Assessed/Time First Assessed: 07/13/20 2230   Wound Type: Non-pressure wound  Location: Lumbar  Location Orientation: Right  Wound Description (Comments): Open, small, round    Assessments 07/13/2020 10:30  PM 07/19/2020  9:00 PM  Dressing Type Foam - Lift dressing to assess site every shift Foam - Lift dressing to assess site every shift  Dressing Changed New --  Dressing Status Clean;Dry;Intact Clean;Dry;Intact  Site / Wound Assessment Dry;Clean Dressing in place / Unable to assess  Peri-wound Assessment Intact Intact  Closure None --  Drainage Amount None --  Treatment Cleansed --     No Linked orders to display     Wound / Incision (Open or Dehisced) 07/13/20 Non-pressure wound Buttocks Left 2 small round openings (Active)  Date First Assessed/Time First Assessed: 07/13/20 2230   Wound Type: Non-pressure wound  Location: Buttocks  Location Orientation: (c) Left  Wound Description (Comments): 2 small round openings    Assessments 07/13/2020 10:30 PM 07/19/2020  9:00 PM  Dressing Type None None  Site / Wound Assessment Clean;Dry Clean;Dry  Peri-wound Assessment Intact Intact  Drainage Amount None --  Treatment Other (Comment);Cleansed --     No Linked orders to display    Discharge Exam:   Vitals:   07/20/20 1750 07/20/20 1750 07/20/20 2119 07/21/20 0523  BP: 140/64 140/64 114/80 119/68  Pulse: 67 67 82 69  Resp:  _0 Temp: 98 F (36.7 C) 98 F (36.7 C)    TempSrc: Oral Oral    SpO2:  96% 93% 97%  Weight:      Height:        Body mass index is 38.71 kg/m.  General exam: Elderly African-American female. Skin: No rashes, lesions or ulcers. HEENT: Atraumatic, normocephalic, no obvious bleeding Lungs: Clear to auscultation bilaterally CVS: Regular rate and rhythm, no murmur GI/Abd soft, nontender, nondistended, bowel sound present CNS: Alert, awake, oriented x3 Psychiatry: Mood appropriate Extremities: Trace bilateral pedal edema with stasis changes.  No calf tenderness  Follow ups:   Discharge Instructions    Diet - low sodium heart healthy   Complete by: As directed    Increase activity slowly   Complete by: As directed    No dressing needed   Complete by: As directed       Follow-up [facility] Follow up.   Why: agency will provide home health physical therapy       Kathy Perna, NP Follow up.   Specialty: Internal Medicine Contact information: [place] [address] [phone]        Troy Sine, MD .   Specialty: Cardiology Contact information: [address] [place] [address] [phone]        Shona Needles, MD Follow up.   Specialty: Orthopedic Surgery Contact information: [place]  [address] [phone]               Recommendations for Outpatient Follow-Up:   1. Follow-up with PCP as an outpatient 2. Follow-up with oncology as an outpatient  Discharge Instructions:  Follow with Primary MD Kathy Perna, NP in 7 days   Get CBC/BMP checked in next visit within 1 week by PCP or SNF MD ( we routinely change or add medications that can affect your baseline labs and fluid status, therefore we recommend that you get the mentioned basic workup next visit with your PCP, your PCP may decide not to get them or add new tests based on their  clinical decision)  On your next visit with your PCP, please Get Medicines reviewed and adjusted.  Please request your PCP  to  go over all Hospital Tests and Procedure/Radiological results at the follow up, please get all Hospital records sent to your Prim MD by signing hospital release before you go home.  Activity: As tolerated with Full fall precautions use walker/cane & assistance as needed  For Heart failure patients - Check your Weight same time everyday, if you gain over 2 pounds, or you develop in leg swelling, experience more shortness of breath or chest pain, call your Primary MD immediately. Follow Cardiac Low Salt Diet and 1.5 lit/day fluid restriction.  If you have smoked or chewed Tobacco in the last 2 yrs please stop smoking, stop any regular Alcohol  and or any Recreational drug use.  If you experience worsening of your admission symptoms, develop shortness of breath, life threatening emergency, suicidal or homicidal thoughts you must seek medical attention immediately by calling 911 or calling your MD immediately  if symptoms less severe.  You Must read complete instructions/literature along with all the possible adverse reactions/side effects for all the Medicines you take and that have been prescribed to you. Take any new Medicines after you have completely understood and accpet all the possible adverse reactions/side effects.   Do not drive, operate heavy machinery, perform activities at heights, swimming or participation in water activities or provide baby sitting services if your were admitted for syncope or siezures until you have seen by Primary MD or a Neurologist and advised to do so again.  Do not drive when taking Pain medications.  Do not take more than prescribed Pain, Sleep and Anxiety Medications  Wear Seat belts while driving.   Please note You were cared for by a hospitalist during your hospital stay. If you have any questions about your discharge medications  or the care you received while you were in the hospital after you are discharged, you can call the unit and asked to speak with the hospitalist on call if the hospitalist that took care of you is not available. Once you are discharged, your primary care physician will handle any further medical issues. Please note that NO REFILLS for any discharge medications will be authorized once you are discharged, as it is imperative that you return to your primary care physician (or establish a relationship with a primary care physician if you do not have one) for your aftercare needs so that they can reassess your need for medications and monitor your lab values.    Allergies as of 07/21/2020      Reactions   Other Shortness Of Breath, Other (See Comments)   Newspaper ink =  new chest pain, also   Ativan [lorazepam] Other (See Comments)   Severe confusion and agitation.    Iodine Other (See Comments)   Unknown reaction   Iodine I 131 Tositumomab    Merbromin Other (See Comments)   Unknown reaction - Mercurochrome- "Was a long time ago"    Tape Rash      Medication List    STOP taking these medications   insulin glargine 100 UNIT/ML injection Commonly known as: LANTUS   metFORMIN 1000 MG tablet Commonly known as: GLUCOPHAGE   methocarbamol 500 MG tablet Commonly known as: Robaxin   ondansetron 4 MG tablet Commonly known as: ZOFRAN   sodium chloride 0.65 % Soln nasal spray Commonly known as: OCEAN   traMADol 50 MG tablet Commonly known as: ULTRAM     TAKE these medications   acetaminophen 500 MG tablet Commonly known as: TYLENOL Take 1,000 mg by mouth at  bedtime.   albuterol 108 (90 Base) MCG/ACT inhaler Commonly known as: VENTOLIN HFA INHALE 1-2 PUFFS BY MOUTH EVERY 6 HOURS AS NEEDED FOR WHEEZE OR SHORTNESS OF BREATH What changed: See the new instructions.   blood glucose meter kit and supplies Dispense based on patient and insurance preference. Use up to four times daily as  directed. (FOR ICD-10 E10.9, E11.9).   blood glucose meter kit and supplies Kit Dispense based on patient and insurance preference. Use up to four times daily as directed. (FOR ICD-9 250.00, 250.01). For QAC - HS accuchecks.   [employer] D3 125 MCG (5000 UT) capsule Generic drug: Cholecalciferol Take 5,000 Units by mouth daily.   Ensure Max Protein Liqd Take 330 mLs (11 oz total) by mouth 3 (three) times daily.   famotidine 40 MG tablet Commonly known as: PEPCID Take 1 tablet (40 mg total) by mouth daily.   fluticasone 50 MCG/ACT nasal spray Commonly known as: FLONASE PLACE 2 SPRAYS INTO BOTH NOSTRILS DAILY AS NEEDED FOR ALLERGIES OR RHINITIS.   Fluticasone-Salmeterol 100-50 MCG/DOSE Aepb Commonly known as: ADVAIR Inhale 1 puff into the lungs every 12 (twelve) hours.   FREESTYLE LITE test strip Generic drug: glucose blood For glucose testing every before meals at bedtime. Diagnosis E 11.65  Can substitute to any accepted brand   furosemide 20 MG tablet Commonly known as: LASIX Take 1 pill in the morning What changed:   how much to take  how to take this  when to take this  additional instructions   guaiFENesin-dextromethorphan 100-10 MG/5ML syrup Commonly known as: ROBITUSSIN DM Take 5 mLs by mouth every 4 (four) hours as needed for cough.   HYDROcodone-acetaminophen 7.5-325 MG tablet Commonly known as: NORCO Take 1 tablet by mouth every 6 (six) hours as needed for up to 5 days for moderate pain.   linaclotide 145 MCG Caps capsule Commonly known as: LINZESS Take 1 capsule (145 mcg total) by mouth daily before breakfast. What changed:   when to take this  reasons to take this   metoprolol succinate 25 MG 24 hr tablet Commonly known as: TOPROL-XL Take 1 tablet (25 mg total) by mouth daily. What changed: additional instructions   metoprolol succinate 50 MG 24 hr tablet Commonly known as: TOPROL-XL TAKE 1 TABLET BY MOUTH EVERY DAY IN THE MORNING What changed:  See the new instructions.   montelukast 10 MG tablet Commonly known as: SINGULAIR Take 10 mg by mouth at bedtime.   multivitamin with minerals Tabs tablet Take 1 tablet by mouth daily. What changed: when to take this   olopatadine 0.1 % ophthalmic solution Commonly known as: Patanol Place 1 drop into both eyes 2 (two) times daily.   OXYGEN Inhale 2-3 L into the lungs See admin instructions. Takes 2 L during day time if needed and 3 L at night   pantoprazole 40 MG tablet Commonly known as: PROTONIX Take 1 tablet (40 mg total) by mouth daily.   polyethylene glycol 17 g packet Commonly known as: MIRALAX / GLYCOLAX Take 17 g by mouth daily as needed for mild constipation or moderate constipation. What changed: when to take this   pravastatin 40 MG tablet Commonly known as: PRAVACHOL Take 1 tablet (40 mg total) by mouth at bedtime.   pregabalin 100 MG capsule Commonly known as: Lyrica Take 1 capsule (100 mg total) by mouth at bedtime.   senna-docusate 8.6-50 MG tablet Commonly known as: Senokot-S Take 1 tablet by mouth 2 (two) times daily.  sertraline 100 MG tablet Commonly known as: ZOLOFT TAKE 1 TABLET BY MOUTH AT BEDTIME What changed:   how much to take  how to take this  when to take this  additional instructions   simethicone 80 MG chewable tablet Commonly known as: MYLICON Chew 1 tablet (80 mg total) by mouth 4 (four) times daily as needed for flatulence.   traZODone 50 MG tablet Commonly known as: DESYREL Take 100 mg by mouth at bedtime.            Durable Medical Equipment  (From admission, onward)         Start     Ordered   07/17/20 0942  For home use only DME oxygen  Once       Question Answer Comment  Length of Need Lifetime   Mode or (Route) Nasal cannula   Liters per Minute 4   Frequency Continuous (stationary and portable oxygen unit needed)   Oxygen conserving device Yes   Oxygen delivery system Gas      07/17/20 0941            Discharge Care Instructions  (From admission, onward)         Start     Ordered   07/21/20 0000  No dressing needed        07/21/20 1159          Time coordinating discharge: 35 minutes  The results of significant diagnostics from this hospitalization (including imaging, microbiology, ancillary and laboratory) are listed below for reference.    Procedures and Diagnostic Studies:   No results found.   Labs:   Basic Metabolic Panel: Recent Labs  Lab 07/17/20 0540 07/18/20 0512 07/18/20 0825 07/19/20 0514 07/20/20 0548 07/21/20 0506  NA 139  --  134* 135 136 136  K 4.0  --  3.6 3.8 4.1 4.0  CL 103  --  100 102 101 102  CO2 30  --  _0 GLUCOSE 100*  --  88 94 98 100*  BUN 13  --  _1 CREATININE 0.64  --  0.73 0.60 0.65 0.71  CALCIUM 9.3  --  8.9 8.9 9.2 8.9  MG 1.8 2.0  --  1.8 1.7 1.8   GFR Estimated Creatinine Clearance: 76.1 mL/min (by C-G formula based on SCr of 0.71 mg/dL). Liver Function Tests: Recent Labs  Lab 07/20/20 1133  BILITOT 1.8*   No results for input(s): LIPASE, AMYLASE in the last 168 hours. No results for input(s): AMMONIA in the last 168 hours. Coagulation profile No results for input(s): INR, PROTIME in the last 168 hours.  CBC: Recent Labs  Lab 07/16/20 0512 07/17/20 0540 07/18/20 0825 07/19/20 0514 07/20/20 0548 07/21/20 0506  WBC 6.6 7.7 8.5 9.3 10.3 10.3  NEUTROABS 1.0*  --  1.3* 1.4* 1.6* 1.2*  HGB 7.9* 7.9* 8.3* 7.8* 7.4* 8.7*  HCT 25.9* 26.0* 26.7* 25.2* 23.6* 27.7*  MCV 99.6 98.1 94.3 96.9 94.8 93.6  PLT 41* 35* 26* 24* 27* 30*   Cardiac Enzymes: No results for input(s): CKTOTAL, CKMB, CKMBINDEX, TROPONINI in the last 168 hours. BNP: Invalid input(s): POCBNP CBG: Recent Labs  Lab 07/20/20 1206 07/20/20 1743 07/20/20 2156 07/21/20 0743 07/21/20 1109  GLUCAP 149* 101* 134* 93 165*   D-Dimer No results for input(s): DDIMER in the last 72 hours. Hgb A1c No results for input(s):  HGBA1C in the last 72 hours. Lipid Profile No results for input(s): CHOL, HDL,  LDLCALC, TRIG, CHOLHDL, LDLDIRECT in the last 72 hours. Thyroid function studies No results for input(s): TSH, T4TOTAL, T3FREE, THYROIDAB in the last 72 hours.  Invalid input(s): FREET3 Anemia work up Recent Labs    07/20/20 1133  RETICCTPCT 1.7   Microbiology No results found for this or any previous visit (from the past 240 hour(s)).   Signed: Terrilee Croak  [facility] 07/21/2020, 2:34 PM

## 2020-07-21 NOTE — Progress Notes (Signed)
Physical Therapy Treatment Patient Details Name: Kathy Irwin MRN: [id number] DOB: July 24, 1950 Today's Date: 07/21/2020    History of Present Illness 70 y.o. female with medical history significant of chronic myelomonocytic leukemia, chronic diastolic heart failure, chronic anemia, diabetes mellitus type 2, hyperlipidemia, hypersensitivity pneumonitis, interstitial lung disease, GERD, thrombocytopenia. Recent hip fracture in january 2022.I & D for R hip infection postponed 2* thrombocytopenia.    PT Comments    Pt progressing slowly with her mobility and has declined SNF, now plans to D/C to home with family support. Pt in bed on 3 lts sats at 96%.Trial RA.  SATURATION QUALIFICATIONS: (This note is used to comply with regulatory documentation for home oxygen)  Patient Saturations on Room Air at Rest = 90%  Patient Saturations on Room Air while Ambulating 11 feet = 74%  Patient Saturations on 4 Liters of oxygen while Ambulating = 90%  Please briefly explain why patient needs home oxygen:  Pt requires 3 lts supplemental oxygen at rest and 4 lts with activity.    Assisted with ambulating to bathroom.  General transfer comment: self able with increased time to rise from elevated bed and regular toilet height but does have a tendancy to "plop" uncontrolled decend with stand to sit.  VC's needed to correct. General Gait Details: trial RA amb to bathroom sats decreased from 90% at rest to 74% with dyspnea 2/4 and HR avg 96.  Visibly SOB with Min c/o fatigue and weakness.  Pt stated, "I feel better but not like I was". Pt self able to perform peri care seated to conserve energy.  Assisted with ambulating to recliner another 11 feet away.  Uncontrolled "plop" due to fatigue. Educated pt on increasing her activity level and need for increased oxygen.       Follow Up Recommendations  Home health PT (pt declines SNF and plans to D/C to home with family support)     Equipment Recommendations  None  recommended by PT    Recommendations for Other Services       Precautions / Restrictions Precautions Precautions: Fall Precaution Comments: monitor sats    Mobility  Bed Mobility Overal bed mobility: Modified Independent             General bed mobility comments: self able with increased time "let me do it"    Transfers Overall transfer level: Needs assistance Equipment used: Rolling walker (2 wheeled) Transfers: Sit to/from [facility] Sit to Stand: Supervision Stand pivot transfers: Supervision       General transfer comment: self able with increased time to rise from elevated bed and regular toilet height but does have a tendancy to "plop" uncontrolled decend with stand to sit  Ambulation/Gait Ambulation/Gait assistance: Supervision Gait Distance (Feet): 22 Feet (11 feet x 2 to and from bathroom) Assistive device: Rolling walker (2 wheeled) Gait Pattern/deviations: Step-through pattern;Decreased stride length;Trunk flexed Gait velocity: decreased   General Gait Details: trial RA amb to bathroom sats decreased from 90% at rest to 74% with dyspnea 2/4 and HR avg 96.  Visibly SOB with Min c/o fatigue and weakness.  Pt stated, "I feel better but not like I was".   Stairs             Wheelchair Mobility    Modified Rankin (Stroke Patients Only)       Balance  Cognition Arousal/Alertness: Awake/alert Behavior During Therapy: WFL for tasks assessed/performed;Flat affect Overall Cognitive Status: Within Functional Limits for tasks assessed                                 General Comments: AxO x 3 pleasant and happy to go home today      Exercises      General Comments        Pertinent Vitals/Pain Pain Assessment: No/denies pain    Home Living                      Prior Function            PT Goals (current goals can now be found in the  care plan section) Progress towards PT goals: Progressing toward goals    Frequency    Min 3X/week      PT Plan Current plan remains appropriate    Co-evaluation              AM-PAC PT "6 Clicks" Mobility   Outcome Measure  Help needed turning from your back to your side while in a flat bed without using bedrails?: None Help needed moving from lying on your back to sitting on the side of a flat bed without using bedrails?: None Help needed moving to and from a bed to a chair (including a wheelchair)?: None Help needed standing up from a chair using your arms (e.g., wheelchair or bedside chair)?: None Help needed to walk in hospital room?: A Little Help needed climbing 3-5 steps with a railing? : A Lot 6 Click Score: 21    End of Session Equipment Utilized During Treatment: Gait belt Activity Tolerance: Patient limited by fatigue Patient left: with call bell/phone within reach;with bed alarm set;in chair Nurse Communication: Mobility status PT Visit Diagnosis: Difficulty in walking, not elsewhere classified (R26.2);Pain Pain - Right/Left: Right     Time: 7793-9688 PT Time Calculation (min) (ACUTE ONLY): 34 min  Charges:  $Gait Training: 8-22 mins $Therapeutic Activity: 8-22 mins                     Rica Koyanagi  PTA Acute  Rehabilitation Services Pager      [phone] Office      [phone]

## 2020-07-21 NOTE — Progress Notes (Signed)
Physician Discharge Summary  Kathy Irwin ONG:[id number] DOB: 09-08-1950 DOA: 07/05/2020  PCP: Kerin Perna, NP  Admit date: 07/05/2020 Discharge date: 07/21/2020  Admitted From: Home Discharge disposition: Home with home health services   Code Status: Full Code  Diet Recommendation: Cardiac/diabetic diet  Discharge Diagnosis:   Principal Problem:   Thrombocytopenia ([name]) Active Problems:   Hypoxia   Hypersensitivity pneumonitis (HCC)   Obesity, Class III, BMI 40-49.9 (morbid obesity) ([place])   Essential hypertension   Postoperative wound infection of right hip   Hyperbilirubinemia   Idiopathic thrombocytopenia (HCC)   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[place])   Counseling regarding advance care planning and goals of care   Pyogenic arthritis of right hip (HCC)   Symptomatic anemia  History of Present Illness / Brief narrative:  Kathy Irwin a 70 y.o.femalewith medical history significant ofchronic myelomonocytic leukemia, chronic diastolic heart failure, chronic anemia, diabetes mellitus type 2, hyperlipidemia, hypersensitivity pneumonitis, interstitial lung disease, GERD, thrombocytopenia. 07/05/2020, patient was sent from oncology office for acute drop in platelets which are chronically low due to ITP.  Subjective:  Seen and examined this morning.  [name].  Lying on bed.  Not in distress.  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  Patient follows up with Dr. Irene Limbo as an outpatient. -On admission, platelet was noted to be low at 17,000.  She was given 2 doses of Nplate and IVIG with no significant improvement. -3/23, patient underwent bone marrow biopsy.  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  Per oncology, inadequate response to Nplate and IVIG was probably because of thrombocytopenia primarily  related to CMML and not ITP. -Patient did not require platelet transfusion.  Her last several days, patient's platelets have been stable between 20s and 30s. -Follow-up with hematology as an outpatient. Recent Labs  Lab 07/15/20 0535 07/16/20 0512 07/17/20 0540 07/18/20 0825 07/19/20 0514 07/20/20 0548 07/21/20 0506  PLT 45* 41* 35* 26* 24* 27* 30*   Acute on chronic anemia -Baseline hemoglobin over 10.  Presented with a low hemoglobin of 7.9. -No active bleeding during this hospitalization but hemoglobin went as low as 7.4.  She has received a total of 4 units of PRBC during his hospital stay.  Hemoglobin is now stable. -We will discharge her home to continue to follow-up with hematology as an outpatient. Recent Labs    01/21/20 1146 01/21/20 1147 04/29/20 1205 05/02/20 1435 05/03/20 0428 07/05/20 1443 07/06/20 4401 07/17/20 0540 07/18/20 0825 07/19/20 0514 07/20/20 0548 07/20/20 1133 07/21/20 0506  HGB  --  11.5*   < >  --    < >  --    < > 7.9* 8.3* 7.8* 7.4*  --  8.7*  MCV  --  97.5   < >  --    < >  --    < > 98.1 94.3 96.9 94.8  --  93.6  VITAMINB12  --  639  --   --   --   --   --   --   --   --   --   --   --   FERRITIN 619*  --   --  1,485*  --  792*  --   --   --   --   --   --   --   TIBC 274  --   --   --   --  203*  --   --   --   --   --   --   --  IRON 123  --   --   --   --  78  --   --   --   --   --   --   --   RETICCTPCT  --   --   --   --   --   --   --   --   --   --   --  1.7  --    < > = values in this interval not displayed.   Right hip infection status post IM nail 04/30/2020 -Patient was noted to have developed a postop infection of the right hip with plans for irrigation and debridement by Dr. Doreatha Martin which was canceled due to significant thrombocytopenia and lack of drainage from right hip on day of surgery. -Patient seen in consultation by ID while recommending to monitor off antibiotics. -CT right hip with no significant fluid collection.   Discussed with ID and orthopedics. -No intervention as an inpatient. -Outpatient follow-up with orthopedics.  Acute exacerbation of diastolic CHF Acute on chronic hypoxic respiratory failure History of hypersensitive pneumonitis in 2 -3 lpm O2  --Patient was noted to be visibly short of breath on 07/14/2020 and 07/16/2020.. -Patient was working with PT and noted to have sats of 78% on room air with activity with sats going greater than 90% on 4 L with patient inability to tolerate ambulation in the hallway. -Patient was likely volume overloaded. -Chest x-ray done with widening of upper mediastinum, bilateral interstitial prominence concern for interstitial edema and/or pneumonitis.   -Improved with oral Lasix.  Continue oral Lasix and beta-blocker at discharge -Echo from April 2021 showed EF of 70 to 75% with no wall motion abnormality, moderate elevated pulmonary artery pressure. -Currently on 3 L oxygen by nasal cannula. -Discharge with continued supplemental oxygen. -Continue Albuterol, Flonase, Advair, Singulair,  Diabetes mellitus type 2 -A1c 6.7 in January 2022  -Home meds include Lantus 10 units daily, Metformin 1000 mg twice daily. -Not sure if patient was compliant to his medications.  Currently both are on hold and blood sugar level is consistently less than 200. -I would not resume these at discharge.  Patient needs to continue to monitor blood sugar level at home and resume previous meds gradually. Recent Labs  Lab 07/20/20 1206 07/20/20 1743 07/20/20 2156 07/21/20 0743 07/21/20 1109  GLUCAP 149* 101* 134* 93 165*   Hyperlipidemia -Continue statin.     Gastroesophageal reflux disease -Continue Pepcid and Protonix.    Chronic pain -Continue Norco as needed, Pregabalin 100 mg at bedtime, Zoloft 100 mg at bedtime, trazodone 1 mg at bedtime,  Chronic constipation -Continue Linzess as needed, MiraLAX as needed  Impaired mobility -PT evaluation was obtained.  SNF  recommended.  Patient and family however chose to go home with services.  Home health services ordered.  Wound care: Incision (Closed) 04/30/20 Thigh Right (Active)  Date First Assessed/Time First Assessed: 04/30/20 1453   Location: Thigh  Location Orientation: Right    Assessments 04/30/2020  4:00 PM 07/20/2020  9:15 PM  Dressing Type Liquid skin adhesive;Other (Comment) Foam - Lift dressing to assess site every shift     No Linked orders to display     Wound / Incision (Open or Dehisced) 07/13/20 Non-pressure wound Lumbar Right Open, small, round (Active)  Date First Assessed/Time First Assessed: 07/13/20 2230   Wound Type: Non-pressure wound  Location: Lumbar  Location Orientation: Right  Wound Description (Comments): Open, small, round    Assessments 07/13/2020 10:30  PM 07/19/2020  9:00 PM  Dressing Type Foam - Lift dressing to assess site every shift Foam - Lift dressing to assess site every shift  Dressing Changed New --  Dressing Status Clean;Dry;Intact Clean;Dry;Intact  Site / Wound Assessment Dry;Clean Dressing in place / Unable to assess  Peri-wound Assessment Intact Intact  Closure None --  Drainage Amount None --  Treatment Cleansed --     No Linked orders to display     Wound / Incision (Open or Dehisced) 07/13/20 Non-pressure wound Buttocks Left 2 small round openings (Active)  Date First Assessed/Time First Assessed: 07/13/20 2230   Wound Type: Non-pressure wound  Location: Buttocks  Location Orientation: (c) Left  Wound Description (Comments): 2 small round openings    Assessments 07/13/2020 10:30 PM 07/19/2020  9:00 PM  Dressing Type None None  Site / Wound Assessment Clean;Dry Clean;Dry  Peri-wound Assessment Intact Intact  Drainage Amount None --  Treatment Other (Comment);Cleansed --     No Linked orders to display    Discharge Exam:   Vitals:   07/20/20 1750 07/20/20 1750 07/20/20 2119 07/21/20 0523  BP: 140/64 140/64 114/80 119/68  Pulse: 67 67 82 69  Resp:  _0 Temp: 98 F (36.7 C) 98 F (36.7 C)    TempSrc: Oral Oral    SpO2:  96% 93% 97%  Weight:      Height:        Body mass index is 38.71 kg/m.  General exam: Elderly African-American female. Skin: No rashes, lesions or ulcers. HEENT: Atraumatic, normocephalic, no obvious bleeding Lungs: Clear to auscultation bilaterally CVS: Regular rate and rhythm, no murmur GI/Abd soft, nontender, nondistended, bowel sound present CNS: Alert, awake, oriented x3 Psychiatry: Mood appropriate Extremities: Trace bilateral pedal edema with stasis changes.  No calf tenderness  Follow ups:   Discharge Instructions    Diet - low sodium heart healthy   Complete by: As directed    Increase activity slowly   Complete by: As directed    No dressing needed   Complete by: As directed       Follow-up [facility] Follow up.   Why: agency will provide home health physical therapy       Kerin Perna, NP Follow up.   Specialty: Internal Medicine Contact information: [place] [address] [phone]        Troy Sine, MD .   Specialty: Cardiology Contact information: [address] [place] [address] [phone]        Shona Needles, MD Follow up.   Specialty: Orthopedic Surgery Contact information: [place] [address] [phone]               Recommendations for Outpatient Follow-Up:   1. Follow-up with PCP as an outpatient 2. Follow-up with oncology as an outpatient  Discharge Instructions:  Follow with Primary MD Kerin Perna, NP in 7 days   Get CBC/BMP checked in next visit within 1 week by PCP or SNF MD ( we routinely change or add medications that can affect your baseline labs and fluid status, therefore we recommend that you get the mentioned basic workup next visit with your PCP, your PCP may decide not to get them or add new tests based on their  clinical decision)  On your next visit with your PCP, please Get Medicines reviewed and adjusted.  Please request your PCP  to  go over all Hospital Tests and Procedure/Radiological results at the follow up, please get all Hospital records sent to your Prim MD by signing hospital release before you go home.  Activity: As tolerated with Full fall precautions use walker/cane & assistance as needed  For Heart failure patients - Check your Weight same time everyday, if you gain over 2 pounds, or you develop in leg swelling, experience more shortness of breath or chest pain, call your Primary MD immediately. Follow Cardiac Low Salt Diet and 1.5 lit/day fluid restriction.  If you have smoked or chewed Tobacco in the last 2 yrs please stop smoking, stop any regular Alcohol  and or any Recreational drug use.  If you experience worsening of your admission symptoms, develop shortness of breath, life threatening emergency, suicidal or homicidal thoughts you must seek medical attention immediately by calling 911 or calling your MD immediately  if symptoms less severe.  You Must read complete instructions/literature along with all the possible adverse reactions/side effects for all the Medicines you take and that have been prescribed to you. Take any new Medicines after you have completely understood and accpet all the possible adverse reactions/side effects.   Do not drive, operate heavy machinery, perform activities at heights, swimming or participation in water activities or provide baby sitting services if your were admitted for syncope or siezures until you have seen by Primary MD or a Neurologist and advised to do so again.  Do not drive when taking Pain medications.  Do not take more than prescribed Pain, Sleep and Anxiety Medications  Wear Seat belts while driving.   Please note You were cared for by a hospitalist during your hospital stay. If you have any questions about your discharge medications  or the care you received while you were in the hospital after you are discharged, you can call the unit and asked to speak with the hospitalist on call if the hospitalist that took care of you is not available. Once you are discharged, your primary care physician will handle any further medical issues. Please note that NO REFILLS for any discharge medications will be authorized once you are discharged, as it is imperative that you return to your primary care physician (or establish a relationship with a primary care physician if you do not have one) for your aftercare needs so that they can reassess your need for medications and monitor your lab values.    Allergies as of 07/21/2020      Reactions   Other Shortness Of Breath, Other (See Comments)   Newspaper ink =  new chest pain, also   Ativan [lorazepam] Other (See Comments)   Severe confusion and agitation.    Iodine Other (See Comments)   Unknown reaction   Iodine I 131 Tositumomab    Merbromin Other (See Comments)   Unknown reaction - Mercurochrome- "Was a long time ago"    Tape Rash      Medication List    STOP taking these medications   insulin glargine 100 UNIT/ML injection Commonly known as: LANTUS   metFORMIN 1000 MG tablet Commonly known as: GLUCOPHAGE   methocarbamol 500 MG tablet Commonly known as: Robaxin   ondansetron 4 MG tablet Commonly known as: ZOFRAN   sodium chloride 0.65 % Soln nasal spray Commonly known as: OCEAN   traMADol 50 MG tablet Commonly known as: ULTRAM     TAKE these medications   acetaminophen 500 MG tablet Commonly known as: TYLENOL Take 1,000 mg by mouth at  bedtime.   albuterol 108 (90 Base) MCG/ACT inhaler Commonly known as: VENTOLIN HFA INHALE 1-2 PUFFS BY MOUTH EVERY 6 HOURS AS NEEDED FOR WHEEZE OR SHORTNESS OF BREATH What changed: See the new instructions.   blood glucose meter kit and supplies Dispense based on patient and insurance preference. Use up to four times daily as  directed. (FOR ICD-10 E10.9, E11.9).   blood glucose meter kit and supplies Kit Dispense based on patient and insurance preference. Use up to four times daily as directed. (FOR ICD-9 250.00, 250.01). For QAC - HS accuchecks.   [employer] D3 125 MCG (5000 UT) capsule Generic drug: Cholecalciferol Take 5,000 Units by mouth daily.   Ensure Max Protein Liqd Take 330 mLs (11 oz total) by mouth 3 (three) times daily.   famotidine 40 MG tablet Commonly known as: PEPCID Take 1 tablet (40 mg total) by mouth daily.   fluticasone 50 MCG/ACT nasal spray Commonly known as: FLONASE PLACE 2 SPRAYS INTO BOTH NOSTRILS DAILY AS NEEDED FOR ALLERGIES OR RHINITIS.   Fluticasone-Salmeterol 100-50 MCG/DOSE Aepb Commonly known as: ADVAIR Inhale 1 puff into the lungs every 12 (twelve) hours.   FREESTYLE LITE test strip Generic drug: glucose blood For glucose testing every before meals at bedtime. Diagnosis E 11.65  Can substitute to any accepted brand   furosemide 20 MG tablet Commonly known as: LASIX Take 1 pill in the morning What changed:   how much to take  how to take this  when to take this  additional instructions   guaiFENesin-dextromethorphan 100-10 MG/5ML syrup Commonly known as: ROBITUSSIN DM Take 5 mLs by mouth every 4 (four) hours as needed for cough.   HYDROcodone-acetaminophen 7.5-325 MG tablet Commonly known as: NORCO Take 1 tablet by mouth every 6 (six) hours as needed for up to 5 days for moderate pain.   linaclotide 145 MCG Caps capsule Commonly known as: LINZESS Take 1 capsule (145 mcg total) by mouth daily before breakfast. What changed:   when to take this  reasons to take this   metoprolol succinate 25 MG 24 hr tablet Commonly known as: TOPROL-XL Take 1 tablet (25 mg total) by mouth daily. What changed: additional instructions   metoprolol succinate 50 MG 24 hr tablet Commonly known as: TOPROL-XL TAKE 1 TABLET BY MOUTH EVERY DAY IN THE MORNING What changed:  See the new instructions.   montelukast 10 MG tablet Commonly known as: SINGULAIR Take 10 mg by mouth at bedtime.   multivitamin with minerals Tabs tablet Take 1 tablet by mouth daily. What changed: when to take this   olopatadine 0.1 % ophthalmic solution Commonly known as: Patanol Place 1 drop into both eyes 2 (two) times daily.   OXYGEN Inhale 2-3 L into the lungs See admin instructions. Takes 2 L during day time if needed and 3 L at night   pantoprazole 40 MG tablet Commonly known as: PROTONIX Take 1 tablet (40 mg total) by mouth daily.   polyethylene glycol 17 g packet Commonly known as: MIRALAX / GLYCOLAX Take 17 g by mouth daily as needed for mild constipation or moderate constipation. What changed: when to take this   pravastatin 40 MG tablet Commonly known as: PRAVACHOL Take 1 tablet (40 mg total) by mouth at bedtime.   pregabalin 100 MG capsule Commonly known as: Lyrica Take 1 capsule (100 mg total) by mouth at bedtime.   senna-docusate 8.6-50 MG tablet Commonly known as: Senokot-S Take 1 tablet by mouth 2 (two) times daily.  sertraline 100 MG tablet Commonly known as: ZOLOFT TAKE 1 TABLET BY MOUTH AT BEDTIME What changed:   how much to take  how to take this  when to take this  additional instructions   simethicone 80 MG chewable tablet Commonly known as: MYLICON Chew 1 tablet (80 mg total) by mouth 4 (four) times daily as needed for flatulence.   traZODone 50 MG tablet Commonly known as: DESYREL Take 100 mg by mouth at bedtime.            Durable Medical Equipment  (From admission, onward)         Start     Ordered   07/17/20 0942  For home use only DME oxygen  Once       Question Answer Comment  Length of Need Lifetime   Mode or (Route) Nasal cannula   Liters per Minute 4   Frequency Continuous (stationary and portable oxygen unit needed)   Oxygen conserving device Yes   Oxygen delivery system Gas      07/17/20 0941            Discharge Care Instructions  (From admission, onward)         Start     Ordered   07/21/20 0000  No dressing needed        07/21/20 1159          Time coordinating discharge: 35 minutes  The results of significant diagnostics from this hospitalization (including imaging, microbiology, ancillary and laboratory) are listed below for reference.    Procedures and Diagnostic Studies:   No results found.   Labs:   Basic Metabolic Panel: Recent Labs  Lab 07/17/20 0540 07/18/20 0512 07/18/20 0825 07/19/20 0514 07/20/20 0548 07/21/20 0506  NA 139  --  134* 135 136 136  K 4.0  --  3.6 3.8 4.1 4.0  CL 103  --  100 102 101 102  CO2 30  --  _0 GLUCOSE 100*  --  88 94 98 100*  BUN 13  --  _1 CREATININE 0.64  --  0.73 0.60 0.65 0.71  CALCIUM 9.3  --  8.9 8.9 9.2 8.9  MG 1.8 2.0  --  1.8 1.7 1.8   GFR Estimated Creatinine Clearance: 76.1 mL/min (by C-G formula based on SCr of 0.71 mg/dL). Liver Function Tests: Recent Labs  Lab 07/20/20 1133  BILITOT 1.8*   No results for input(s): LIPASE, AMYLASE in the last 168 hours. No results for input(s): AMMONIA in the last 168 hours. Coagulation profile No results for input(s): INR, PROTIME in the last 168 hours.  CBC: Recent Labs  Lab 07/16/20 0512 07/17/20 0540 07/18/20 0825 07/19/20 0514 07/20/20 0548 07/21/20 0506  WBC 6.6 7.7 8.5 9.3 10.3 10.3  NEUTROABS 1.0*  --  1.3* 1.4* 1.6* 1.2*  HGB 7.9* 7.9* 8.3* 7.8* 7.4* 8.7*  HCT 25.9* 26.0* 26.7* 25.2* 23.6* 27.7*  MCV 99.6 98.1 94.3 96.9 94.8 93.6  PLT 41* 35* 26* 24* 27* 30*   Cardiac Enzymes: No results for input(s): CKTOTAL, CKMB, CKMBINDEX, TROPONINI in the last 168 hours. BNP: Invalid input(s): POCBNP CBG: Recent Labs  Lab 07/20/20 1206 07/20/20 1743 07/20/20 2156 07/21/20 0743 07/21/20 1109  GLUCAP 149* 101* 134* 93 165*   D-Dimer No results for input(s): DDIMER in the last 72 hours. Hgb A1c No results for input(s):  HGBA1C in the last 72 hours. Lipid Profile No results for input(s): CHOL, HDL,  LDLCALC, TRIG, CHOLHDL, LDLDIRECT in the last 72 hours. Thyroid function studies No results for input(s): TSH, T4TOTAL, T3FREE, THYROIDAB in the last 72 hours.  Invalid input(s): FREET3 Anemia work up Recent Labs    07/20/20 1133  RETICCTPCT 1.7   Microbiology No results found for this or any previous visit (from the past 240 hour(s)).   Signed: Terrilee Croak  [facility] 07/21/2020, 11:59 AM

## 2020-07-22 ENCOUNTER — Telehealth: Payer: Self-pay

## 2020-07-22 NOTE — Telephone Encounter (Signed)
Transition Care Management Follow-up Telephone Call  Date of discharge and from where: 07/21/2020, [facility]  How have you been since you were released from the hospital? She said she is feeling " pretty good."   Any questions or concerns? No  Items Reviewed:  Did the pt receive and understand the discharge instructions provided? Yes  - she said she has them somewhere  Medications obtained and verified? Yes  -she said she has all of her medications and did not have any questions about her med regime.   Other? No    Any new allergies since your discharge? No   Do you have support at home? she said she lives alone but has some help.   Home Care and Equipment/Supplies: Were home health services ordered? Yes, home PT If so, what is the name of the agency? [facility]  Has the agency set up a time to come to the patient's home? No, not yet.  She just got home yesterday  Were any new equipment or medical supplies ordered?  No What is the name of the medical supply agency? n/a Were you able to get the supplies/equipment? not applicable Do you have any questions related to the use of the equipment or supplies? No   She said that she has a working glucometer. Has home O2. Not sure what company provides the O2.  She uses it at 3L when needed.   She said that she does not have any wound care/dressings.  Functional Questionnaire: (I = Independent and D = Dependent) ADLs: independent, uses walker with ambulation  Follow up appointments reviewed:   PCP Hospital f/u appt confirmed? Yes  Scheduled to see Juluis Mire, NP 08/09/2020. She did not want to schedule an appointment to be seen sooner  [facility] f/u appt confirmed? Yes -oncology - 07/27/2020; [facility] 07/28/2020; cardiology - 08/24/2020.   Are transportation arrangements needed? she is not sure if she will need transportation.  instructed her to contact her insurance company to inquire about transportation services to  her medical appointments  If their condition worsens, is the pt aware to call PCP or go to the Emergency Dept.? Yes  Was the patient provided with contact information for the PCP's office or ED? Yes  Was to pt encouraged to call back with questions or concerns? Yes

## 2020-07-26 NOTE — Progress Notes (Signed)
HEMATOLOGY/ONCOLOGY CONSULTATION NOTE  Date of Service: 07/27/2020  Patient Care Team: Kerin Perna, NP as PCP - General (Internal Medicine) Troy Sine, MD as PCP - Cardiology (Cardiology) Bryn Gulling as Physician Assistant (Orthopedic Surgery) Haddix, Thomasene Lot, MD as Consulting Physician (Orthopedic Surgery)  CHIEF COMPLAINTS/PURPOSE OF CONSULTATION:  CMML Thrombocytopenia  HISTORY OF PRESENTING ILLNESS:   Kathy Irwin is a wonderful 70 y.o. 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 The pt reports that she is doing well overall.  The pt reports that she was diagnosed with CML by [facility] six years ago. 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 They also did a BM Bx which showed that she had chronic leukemia but she is unsure what kind. She has not needed any treatments in 6 years and was only monitored with labs. Her RBC and WBC have remained within the normal range since her diagnosis. Previously her PLTs have gotten as low as 30K but have come back up on their own. She denies any pain or issues related to her leukemia within the last 6 years.   Pt had a recent [place] infection in August. She went back to the hospital on 09/07 with bacterial Pneumonia and was discharged on 09/10. Pt notes that her breathing has improved since that hospitalization but is not back to baseline. She was sent home with oxygen and has been using it. Pt has been using oxygen for about 8 years and she is not sure why it was given to her. Pt was previously diagnosed with Interstitial Lung Disease. She says that when her symptoms worsen she is given steroids for 6 weeks. She is currently on a course of Prednisone. Pt has had sleep apnea testing which revealed that she does not have sleep apnea. Her HTN and Diabetes have been well controlled and she has been taking her medications as prescribed. She also  has home care who has been helping her walk and get some exercise. She will be going to see Dr. Ina Homes, a Pulmonologist, on 10/08.   Most recent lab results (01/09/2019) of CBC w/diff & CMP is as follows: all values are WNL except for RDW at 18.0, PLTs at 117K, nRBC Rel at 1, Glucose at 118.   On review of systems, pt denies back pain, abdominal pain and any other symptoms.   On PMHx the pt reports ILD, HTN, Diabetes, COVID19 infection, bacterial Pneumonia. On Social Hx the pt reports she is a non-smoker and does not drink alcohol  INTERVAL HISTORY:   Kathy Irwin is a wonderful 70 y.o. 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 The patient's last visit with [place] was on 07/05/2020. The pt reports that she is doing well overall. She is here for her post-hospitilization follow up.  The pt reports that she has been at home and has not received any home therapy yet. The pt notes that she was supposed to be set up for home therapy support. She has a f/u w infectious disease tomorrow. The pt is unaware when her orthopedic f/u is. The pt notes no further drainage from hip, but it is still very painful.  Lab results today 07/27/2020 of CBC w/diff and CMP is as follows: all values are WNL except for RBC of 2.70, Hgb of 8.0, HCT of 24.0, RDW of 21.5, Plt of 30K. CMP pending.  On review of systems, pt reports continued right hip pain and denies bleeding issues, nose bleeds, gum bleeds, abdominal pain, back pain, decreased appetite, changes in bowel habits, and any other symptoms.  MEDICAL HISTORY:  Past Medical History:  Diagnosis Date  . Acute respiratory failure with hypoxia ([place]) 12/2018  . Anemia, unspecified   . CML (chronic myelocytic leukemia) ([place])   . Diabetes mellitus without complication ([place])   . Esophageal reflux   . Hyperlipemia   . 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 Hypertension   . ILD (interstitial lung disease) ([place]) 12/24/2018     SURGICAL HISTORY: Past Surgical History:  Procedure Laterality Date  . ABDOMINAL HYSTERECTOMY    . INTRAMEDULLARY (IM) NAIL INTERTROCHANTERIC Right 04/30/2020   Procedure: INTRAMEDULLARY (IM) NAIL INTERTROCHANTRIC;  Surgeon: Shona Needles, MD;  Location: [facility];  Service: Orthopedics;  Laterality: Right;  Marland Kitchen VIDEO BRONCHOSCOPY Bilateral 04/02/2019   Procedure: VIDEO BRONCHOSCOPY WITH FLUORO;  Surgeon: Marshell Garfinkel, MD;  Location: [facility];  Service: Cardiopulmonary;  Laterality: Bilateral;    SOCIAL HISTORY: Social History   Socioeconomic History  . Marital status: Married    Spouse name: Not on file  . Number of children: Not on file  . Years of education: Not on file  . Highest education level: Not on file  Occupational History  . Not on file  Tobacco Use  . Smoking status: Never Smoker  . Smokeless tobacco: Never Used  Vaping Use  . Vaping Use: Never used  Substance and Sexual Activity  . Alcohol use: Never  . Drug use: Never  . Sexual activity: Not on file  Other Topics Concern  . Not on file  Social History Narrative  . Not on file   Social Determinants of Health   Financial Resource Strain: Not on file  Food Insecurity: Not on file  Transportation Needs: Not on file  Physical Activity: Not on file  Stress: Not on file  Social Connections: Not on file  Intimate Partner Violence: Not on file    FAMILY HISTORY: Family History  Problem Relation Age of Onset  . Diabetes Other   . Hypertension Other     ALLERGIES:  is allergic to other, ativan [lorazepam], iodine, iodine i 131 tositumomab, merbromin, and tape.  MEDICATIONS:  Current Outpatient Medications  Medication Sig Dispense Refill  . acetaminophen (TYLENOL) 500 MG tablet Take 1,000 mg by mouth at bedtime.    Marland Kitchen albuterol (VENTOLIN HFA) 108 (90 Base) MCG/ACT inhaler INHALE 1-2 PUFFS BY MOUTH EVERY 6 HOURS AS NEEDED FOR WHEEZE OR SHORTNESS OF BREATH (Patient taking differently: Inhale 2 puffs  into the lungs every 4 (four) hours as needed for wheezing or shortness of breath.) 8.5 each 1  . blood glucose meter kit and supplies KIT Dispense based on patient and insurance preference. Use up to four times daily as directed. (FOR ICD-9 250.00, 250.01). For QAC - HS accuchecks. 1 each 1  . blood glucose meter kit and supplies Dispense based on patient and insurance preference. Use up to four times daily as directed. (FOR ICD-10 E10.9, E11.9). 1 each 0  . [employer] D3 125 MCG (5000 UT) capsule Take 5,000 Units by mouth daily.    . Ensure Max Protein (ENSURE MAX PROTEIN) LIQD Take 330 mLs (11 oz total) by mouth 3 (three) times daily. (Patient not taking: No sig reported)    . famotidine (PEPCID) 40 MG tablet Take 1 tablet (40 mg total) by mouth daily.    . fluticasone (  FLONASE) 50 MCG/ACT nasal spray PLACE 2 SPRAYS INTO BOTH NOSTRILS DAILY AS NEEDED FOR ALLERGIES OR RHINITIS. 18.2 mL 1  . Fluticasone-Salmeterol (ADVAIR) 100-50 MCG/DOSE AEPB Inhale 1 puff into the lungs every 12 (twelve) hours. 60 each 1  . furosemide (LASIX) 20 MG tablet Take 1 pill in the morning (Patient taking differently: Take 20 mg by mouth daily.) 90 tablet 1  . glucose blood (FREESTYLE LITE) test strip For glucose testing every before meals at bedtime. Diagnosis E 11.65  Can substitute to any accepted brand 100 each 0  . guaiFENesin-dextromethorphan (ROBITUSSIN DM) 100-10 MG/5ML syrup Take 5 mLs by mouth every 4 (four) hours as needed for cough. 118 mL 0  . linaclotide (LINZESS) 145 MCG CAPS capsule Take 1 capsule (145 mcg total) by mouth daily before breakfast. (Patient taking differently: Take 145 mcg by mouth daily as needed (irritable bowel syndrome).)    . metoprolol succinate (TOPROL-XL) 25 MG 24 hr tablet Take 1 tablet (25 mg total) by mouth daily. (Patient taking differently: Take 25 mg by mouth daily. Takes along 50 mg to total 75 mg) 90 tablet 1  . metoprolol succinate (TOPROL-XL) 50 MG 24 hr tablet TAKE 1 TABLET BY  MOUTH EVERY DAY IN THE MORNING (Patient taking differently: Take 50 mg by mouth daily. Takes along with 25 mg to total 75 mg) 30 tablet 1  . montelukast (SINGULAIR) 10 MG tablet Take 10 mg by mouth at bedtime.    . Multiple Vitamin (MULTIVITAMIN WITH MINERALS) TABS tablet Take 1 tablet by mouth daily. (Patient taking differently: Take 1 tablet by mouth daily at 12 noon.)    . olopatadine (PATANOL) 0.1 % ophthalmic solution Place 1 drop into both eyes 2 (two) times daily. 5 mL 0  . OXYGEN Inhale 2-3 L into the lungs See admin instructions. Takes 2 L during day time if needed and 3 L at night    . pantoprazole (PROTONIX) 40 MG tablet Take 1 tablet (40 mg total) by mouth daily. 60 tablet 0  . polyethylene glycol (MIRALAX / GLYCOLAX) 17 g packet Take 17 g by mouth daily as needed for mild constipation or moderate constipation. (Patient taking differently: Take 17 g by mouth daily.)  0  . pravastatin (PRAVACHOL) 40 MG tablet Take 1 tablet (40 mg total) by mouth at bedtime. 90 tablet 1  . pregabalin (LYRICA) 100 MG capsule Take 1 capsule (100 mg total) by mouth at bedtime. 90 capsule 0  . senna-docusate (SENOKOT-S) 8.6-50 MG tablet Take 1 tablet by mouth 2 (two) times daily. 60 tablet 2  . sertraline (ZOLOFT) 100 MG tablet TAKE 1 TABLET BY MOUTH AT BEDTIME (Patient taking differently: Take 100 mg by mouth at bedtime.) 90 tablet 0  . simethicone (MYLICON) 80 MG chewable tablet Chew 1 tablet (80 mg total) by mouth 4 (four) times daily as needed for flatulence.    . traZODone (DESYREL) 50 MG tablet Take 100 mg by mouth at bedtime.     No current facility-administered medications for this visit.    REVIEW OF SYSTEMS:   10 Point review of Systems was done is negative except as noted above.  PHYSICAL EXAMINATION: ECOG PERFORMANCE STATUS: 2 - Symptomatic, <50% confined to bed  . Vitals:   07/27/20 1116  BP: (!) 112/55  Pulse: 84  Resp: 19  Temp: 98.1 F (36.7 C)  SpO2: 94%   Filed Weights    .Body mass index is 38.71 kg/m.   Exam was given in a wheelchair.  GENERAL:alert, in no acute distress and comfortable SKIN: no acute rashes, no significant lesions EYES: conjunctiva are pink and non-injected, sclera anicteric OROPHARYNX: MMM, no exudates, no oropharyngeal erythema or ulceration NECK: supple, no JVD LYMPH:  no palpable lymphadenopathy in the cervical, axillary or inguinal regions LUNGS: clear to auscultation b/l with normal respiratory effort HEART: regular rate & rhythm ABDOMEN:  normoactive bowel sounds , non tender, not distended. Extremity: no pedal edema PSYCH: alert & oriented x 3 with fluent speech NEURO: no focal motor/sensory deficits   LABORATORY DATA:  I have reviewed the data as listed  . CBC Latest Ref Rng & Units 07/27/2020 07/21/2020 07/20/2020  WBC 4.0 - 10.5 K/uL PENDING 10.3 10.3  Hemoglobin 12.0 - 15.0 g/dL 8.0(L) 8.7(L) 7.4(L)  Hematocrit 36.0 - 46.0 % 24.0(L) 27.7(L) 23.6(L)  Platelets 150 - 400 K/uL 30(L) 30(L) 27(LL)    . CMP Latest Ref Rng & Units 07/21/2020 07/20/2020 07/19/2020  Glucose 70 - 99 mg/dL 100(H) 98 94  BUN 8 - 23 mg/dL _0 Creatinine 0.44 - 1.00 mg/dL 0.71 0.65 0.60  Sodium 135 - 145 mmol/L 136 136 135  Potassium 3.5 - 5.1 mmol/L 4.0 4.1 3.8  Chloride 98 - 111 mmol/L 102 101 102  CO2 22 - 32 mmol/L _1 Calcium 8.9 - 10.3 mg/dL 8.9 9.2 8.9  Total Protein 6.5 - 8.1 g/dL - - -  Total Bilirubin 0.3 - 1.2 mg/dL - 1.8(H) -  Alkaline Phos 38 - 126 U/L - - -  AST 15 - 41 U/L - - -  ALT 0 - 44 U/L - - -   01/21/2019 FISH:    RADIOGRAPHIC STUDIES: I have personally reviewed the radiological images as listed and agreed with the findings in the report. DG Chest 2 View  Result Date: 07/14/2020 CLINICAL DATA:  Concern for mediastinal widening on chest x-ray obtained earlier today. Hypoxia. EXAM: CHEST - 2 VIEW COMPARISON:  Portable AP view earlier today.  Chest CT 05/02/2020. FINDINGS: Borderline cardiomegaly.  Mediastinal widening on prior exam is related to aortic tortuosity. Similar mediastinal appearance seen on prior exams. Mild background interstitial coarsening, with slight patchy opacity at the lung bases. No pneumothorax or significant pleural effusion. No acute osseous abnormalities are seen. IMPRESSION: 1. The questioned mediastinal widening on prior exam is related to tortuous thoracic aorta. 2. Mild background interstitial coarsening with slight patchy opacity at the lung bases, may be atelectasis or pulmonary edema. There may be underlying interstitial lung disease, that is not well assessed on the current exam. Electronically Signed   By: Keith Rake M.D.   On: 07/14/2020 19:11   CT HIP RIGHT W CONTRAST  Result Date: 07/07/2020 CLINICAL DATA:  Thrombocytopenia, right subtrochanteric fracture. Draining wound for 4 weeks. EXAM: CT OF THE LOWER RIGHT EXTREMITY WITH CONTRAST TECHNIQUE: Multidetector CT imaging of the lower right extremity was performed according to the standard protocol following intravenous contrast administration. CONTRAST:  159m OMNIPAQUE IOHEXOL 300 MG/ML  SOLN COMPARISON:  05/10/2020 FINDINGS: Bones/Joint/Cartilage Ununited comminuted right intertrochanteric fracture transfixed with a intramedullary nail and interlocking femoral neck screws and mild callus formation across the bone fragments. Persistent low-attenuation material around the fracture fragments may reflect residual hemorrhagic material, but infection cannot be excluded. With a soft tissue track extending towards the skin surface beyond the field of view. No drainable fluid collection. No acute hardware failure or complication. 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 Normal alignment. No joint effusion. Ligaments Ligaments are  suboptimally evaluated by CT. Muscles and Tendons Muscles are normal.  No muscle atrophy. Soft tissue No fluid collection or hematoma.  No soft tissue mass. IMPRESSION: 1. Ununited  comminuted right intertrochanteric fracture transfixed with a intramedullary nail and interlocking femoral neck screws and mild callus formation across the bone fragments. Persistent low-attenuation material around the fracture fragments may reflect residual hemorrhagic material, but infection cannot be excluded. With a soft tissue track extending towards the skin surface beyond the field of view. No drainable fluid collection. If there is persistent clinical concern regarding soft tissue infection extending to the fracture site, an MRI of the right hip utilizing mars protocol may be helpful for characterization. Electronically Signed   By: Kathreen Devoid   On: 07/07/2020 10:35   CT BIOPSY  Result Date: 07/07/2020 INDICATION: Thrombocytopenia, history of CML EXAM: CT GUIDED RIGHT ILIAC BONE MARROW ASPIRATION AND CORE BIOPSY Date:  07/07/2020 07/07/2020 10:21 am Radiologist:  M. Daryll Brod, MD Guidance:  CT FLUOROSCOPY TIME:  Fluoroscopy Time: NONE. MEDICATIONS: 1% lidocaine ANESTHESIA/SEDATION: 2.0 mg IV Versed; 100 mcg IV Fentanyl Moderate Sedation Time:  12 minutes The patient was continuously monitored during the procedure by the interventional radiology nurse under my direct supervision. CONTRAST:  147m OMNIPAQUE IOHEXOL 300 MG/ML  SOLN COMPLICATIONS: None PROCEDURE: Informed consent was obtained from the patient following explanation of the procedure, risks, benefits and alternatives. The patient understands, agrees and consents for the procedure. All questions were addressed. A time out was performed. The patient was positioned prone and non-contrast localization CT was performed of the pelvis to demonstrate the iliac marrow spaces. Maximal barrier sterile technique utilized including caps, mask, sterile gowns, sterile gloves, large sterile drape, hand hygiene, and Betadine prep. Under sterile conditions and local anesthesia, an 11 gauge coaxial bone biopsy needle was advanced into the right iliac marrow  space. Needle position was confirmed with CT imaging. Initially, bone marrow aspiration was performed. Next, the 11 gauge outer cannula was utilized to obtain a right iliac bone marrow core biopsy. Needle was removed. Hemostasis was obtained with compression. The patient tolerated the procedure well. Samples were prepared with the cytotechnologist. No immediate complications. IMPRESSION: CT guided right iliac bone marrow aspiration and core biopsy. Electronically Signed   By: MJerilynn Mages  Shick M.D.   On: 07/07/2020 11:46   DG CHEST PORT 1 VIEW  Result Date: 07/14/2020 CLINICAL DATA:  Hypoxia. EXAM: PORTABLE CHEST 1 VIEW COMPARISON:  Chest x-ray 05/10/2020. FINDINGS: One Ng of the upper mediastinum. Although this may be related to prominent great vessels and portable technique, PA and lateral chest x-ray suggested for further evaluation. If mediastinal prominence persist on PA lateral chest x-ray, contrast-enhanced chest CT suggested to further evaluate. Heart size stable. Bilateral interstitial prominence. Interstitial edema and/or pneumonitis could present in this fashion. No pleural effusion or pneumothorax. IMPRESSION: 1. Widening of the upper mediastinum. Although this may be related to prominent great vessels and portable technique, PA and lateral chest x-ray suggested for further evaluation. If mediastinal prominence persists on PA and lateral chest x-ray, contrast-enhanced chest CT suggested to further evaluate. 2. Heart size stable. Bilateral interstitial prominence. Interstitial edema and/or pneumonitis could present in this fashion. Electronically Signed   By: TMarcello Moores Register   On: 07/14/2020 15:18   CT BONE MARROW BIOPSY & ASPIRATION  Result Date: 07/07/2020 INDICATION: Thrombocytopenia, history of CML EXAM: CT GUIDED RIGHT ILIAC BONE MARROW ASPIRATION AND CORE BIOPSY Date:  07/07/2020 07/07/2020 10:21 am Radiologist:  M. TDaryll Brod MD Guidance:  CT FLUOROSCOPY TIME:  Fluoroscopy Time: NONE.  MEDICATIONS: 1% lidocaine ANESTHESIA/SEDATION: 2.0 mg IV Versed; 100 mcg IV Fentanyl Moderate Sedation Time:  12 minutes The patient was continuously monitored during the procedure by the interventional radiology nurse under my direct supervision. CONTRAST:  136m OMNIPAQUE IOHEXOL 300 MG/ML  SOLN COMPLICATIONS: None PROCEDURE: Informed consent was obtained from the patient following explanation of the procedure, risks, benefits and alternatives. The patient understands, agrees and consents for the procedure. All questions were addressed. A time out was performed. The patient was positioned prone and non-contrast localization CT was performed of the pelvis to demonstrate the iliac marrow spaces. Maximal barrier sterile technique utilized including caps, mask, sterile gowns, sterile gloves, large sterile drape, hand hygiene, and Betadine prep. Under sterile conditions and local anesthesia, an 11 gauge coaxial bone biopsy needle was advanced into the right iliac marrow space. Needle position was confirmed with CT imaging. Initially, bone marrow aspiration was performed. Next, the 11 gauge outer cannula was utilized to obtain a right iliac bone marrow core biopsy. Needle was removed. Hemostasis was obtained with compression. The patient tolerated the procedure well. Samples were prepared with the cytotechnologist. No immediate complications. IMPRESSION: CT guided right iliac bone marrow aspiration and core biopsy. Electronically Signed   By: MJerilynn Mages  Shick M.D.   On: 07/07/2020 11:46    ASSESSMENT & PLAN:   1) CMML-1 2)Thrombocytopenia-- likely multifactorial.  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 ITP is likely since platelets improved significantly with recent steroid use. Additional factors worsening her platelet count are infection in her right hip surgical site with drainage and absorption of platelets, multiple lines of antibiotics etc.  PLAN:   -Discussed pt labwork today, 07/27/2020; Plt still  low and stable at 30K.  -Advised pt that she needs to coordinate with PCP and Orthopedic doctors for refills of pain medications. Will send Rx for 30 pills to allow pt to connect with them. -Advised pt that the CMML is at the point where we recommend to start treatment due to affecting her blood counts at this time. Discussed current hesitation due to infection in her hip.  -Advised pt that Prednisone/steroids delay healing in hip and increases risk of infection worsening in hip. -Advised pt that CMML has two components: one part of bone marrow making too many monocytes and genetic changes not allowing stem cells to make enough RBC and Plt. -Advised pt we would recommend Vidaza treatment. This is either IV or shot in skin that is 5 days one week per month. -Advised pt we will get Port if agreeable to her once infection subsides. Will have to start with IV shot due to no Port. -Advised pt that Vidaza can drop blood counts further and increase transfusion needs. Would monitor closely. -Discussed option of switching to Promacta and allowing hip to heal for around two months and holding Vidaza treatment versus starting Vidaza at this time. The pt wishes to talk with her daughters prior to this decision regarding initiating CMML at this time or not. -Will give info to social worker to discuss rides. -Will schedule for chemo counseling for education. -Will get Nplate today as scheduled. -Will see back in two weeks with labs.    FOLLOW UP: Plz schedule next 8 doses of weekly Nplate with labs Chemotherapy counseling for azacitidine in the next week Follow-up with Dr. KIrene Limboas scheduled on 4/26    The total time spent in  the appointment was 30 minutes and more than 50% was on counseling and direct patient cares.    All of the patient's questions were answered with apparent satisfaction. The patient knows to call the clinic with any problems,  questions or concerns.   Sullivan Lone MD [place] AAHIVMS [facility] Hematology/Oncology Physician [facility]  (Office):       [phone] (Work cell):  [phone] (Fax):           [phone]  07/27/2020 11:48 AM  I, Reinaldo Raddle, am acting as scribe for Dr. Sullivan Lone, MD.   .I have reviewed the above documentation for accuracy and completeness, and I agree with the above. Brunetta Genera MD

## 2020-07-27 ENCOUNTER — Inpatient Hospital Stay: Payer: 59

## 2020-07-27 ENCOUNTER — Other Ambulatory Visit: Payer: 59

## 2020-07-27 ENCOUNTER — Ambulatory Visit: Payer: 59

## 2020-07-27 ENCOUNTER — Other Ambulatory Visit: Payer: Self-pay

## 2020-07-27 ENCOUNTER — Inpatient Hospital Stay: Payer: 59 | Attending: Hematology | Admitting: Hematology

## 2020-07-27 VITALS — BP 112/55 | HR 84 | Temp 98.1°F | Resp 19 | Ht 64.0 in

## 2020-07-27 DIAGNOSIS — D696 Thrombocytopenia, unspecified: Secondary | ICD-10-CM | POA: Diagnosis not present

## 2020-07-27 DIAGNOSIS — I10 Essential (primary) hypertension: Secondary | ICD-10-CM | POA: Insufficient documentation

## 2020-07-27 DIAGNOSIS — J849 Interstitial pulmonary disease, unspecified: Secondary | ICD-10-CM | POA: Insufficient documentation

## 2020-07-27 DIAGNOSIS — Z8616 Personal history of COVID-19: Secondary | ICD-10-CM | POA: Diagnosis not present

## 2020-07-27 DIAGNOSIS — E119 Type 2 diabetes mellitus without complications: Secondary | ICD-10-CM | POA: Diagnosis not present

## 2020-07-27 DIAGNOSIS — C9310 Chronic myelomonocytic leukemia not having achieved remission: Secondary | ICD-10-CM | POA: Insufficient documentation

## 2020-07-27 DIAGNOSIS — K219 Gastro-esophageal reflux disease without esophagitis: Secondary | ICD-10-CM | POA: Diagnosis not present

## 2020-07-27 DIAGNOSIS — D693 Immune thrombocytopenic purpura: Secondary | ICD-10-CM | POA: Diagnosis not present

## 2020-07-27 DIAGNOSIS — G4730 Sleep apnea, unspecified: Secondary | ICD-10-CM | POA: Insufficient documentation

## 2020-07-27 DIAGNOSIS — J9601 Acute respiratory failure with hypoxia: Secondary | ICD-10-CM | POA: Diagnosis not present

## 2020-07-27 DIAGNOSIS — Z79899 Other long term (current) drug therapy: Secondary | ICD-10-CM | POA: Insufficient documentation

## 2020-07-27 LAB — CMP (CANCER CENTER ONLY)
ALT: <6 U/L (ref 0–44)
AST: 26 U/L (ref 15–41)
Albumin: 2.9 g/dL — ABNORMAL LOW (ref 3.5–5.0)
Alkaline Phosphatase: 100 U/L (ref 38–126)
Anion gap: 10 (ref 5–15)
BUN: 13 mg/dL (ref 8–23)
CO2: 22 mmol/L (ref 22–32)
Calcium: 9.1 mg/dL (ref 8.9–10.3)
Chloride: 107 mmol/L (ref 98–111)
Creatinine: 0.7 mg/dL (ref 0.44–1.00)
GFR, Estimated: >60 mL/min (ref >60)
Glucose, Bld: 192 mg/dL — ABNORMAL HIGH (ref 70–99)
Potassium: 4.2 mmol/L (ref 3.5–5.1)
Sodium: 139 mmol/L (ref 135–145)
Total Bilirubin: 1.4 mg/dL — ABNORMAL HIGH (ref 0.3–1.2)
Total Protein: 6.4 g/dL — ABNORMAL LOW (ref 6.5–8.1)

## 2020-07-27 LAB — CBC WITH DIFFERENTIAL (CANCER CENTER ONLY)
Abs Immature Granulocytes: 0.9 10*3/uL — ABNORMAL HIGH (ref 0.00–0.07)
Basophils Absolute: 0 10*3/uL (ref 0.0–0.1)
Basophils Relative: 0 %
Eosinophils Absolute: 0 10*3/uL (ref 0.0–0.5)
Eosinophils Relative: 0 %
HCT: 24 % — ABNORMAL LOW (ref 36.0–46.0)
Hemoglobin: 8 g/dL — ABNORMAL LOW (ref 12.0–15.0)
Lymphocytes Relative: 39 %
Lymphs Abs: 5.9 10*3/uL — ABNORMAL HIGH (ref 0.7–4.0)
MCH: 29.6 pg (ref 26.0–34.0)
MCHC: 33.3 g/dL (ref 30.0–36.0)
MCV: 88.9 fL (ref 80.0–100.0)
Metamyelocytes Relative: 5 %
Monocytes Absolute: 5.9 10*3/uL — ABNORMAL HIGH (ref 0.1–1.0)
Monocytes Relative: 39 %
Myelocytes: 1 %
Neutro Abs: 2.4 10*3/uL (ref 1.7–7.7)
Neutrophils Relative %: 16 %
Platelet Count: 30 10*3/uL — ABNORMAL LOW (ref 150–400)
RBC: 2.7 MIL/uL — ABNORMAL LOW (ref 3.87–5.11)
RDW: 21.5 % — ABNORMAL HIGH (ref 11.5–15.5)
WBC Count: 15.2 10*3/uL — ABNORMAL HIGH (ref 4.0–10.5)
nRBC: 37.3 % — ABNORMAL HIGH (ref 0.0–0.2)

## 2020-07-27 MED ORDER — ROMIPLOSTIM INJECTION 500 MCG
6.0000 ug/kg | Freq: Once | SUBCUTANEOUS | Status: AC
Start: 2020-07-27 — End: 2020-07-27
  Administered 2020-07-27: 615 ug via SUBCUTANEOUS
  Filled 2020-07-27: qty 1

## 2020-07-27 NOTE — Patient Instructions (Signed)
Romiplostim injection What is this medicine? ROMIPLOSTIM (roe mi PLOE stim) helps your body make more platelets. This medicine is used to treat low platelets caused by chronic idiopathic thrombocytopenic purpura (ITP) or a bone marrow syndrome caused by radiation sickness. This medicine may be used for other purposes; ask your health care provider or pharmacist if you have questions. COMMON BRAND NAME(S): Nplate What should I tell my health care provider before I take this medicine? They need to know if you have any of these conditions:  blood clots  myelodysplastic syndrome  an unusual or allergic reaction to romiplostim, mannitol, other medicines, foods, dyes, or preservatives  pregnant or trying to get pregnant  breast-feeding How should I use this medicine? This medicine is injected under the skin. It is given by a health care provider in a hospital or clinic setting. A special MedGuide will be given to you before each treatment. Be sure to read this information carefully each time. Talk to your health care provider about the use of this medicine in children. While it may be prescribed for children as young as newborns for selected conditions, precautions do apply. Overdosage: If you think you have taken too much of this medicine contact a poison control center or emergency room at once. NOTE: This medicine is only for you. Do not share this medicine with others. What if I miss a dose? Keep appointments for follow-up doses. It is important not to miss your dose. Call your health care provider if you are unable to keep an appointment. What may interact with this medicine? Interactions are not expected. This list may not describe all possible interactions. Give your health care provider a list of all the medicines, herbs, non-prescription drugs, or dietary supplements you use. Also tell them if you smoke, drink alcohol, or use illegal drugs. Some items may interact with your  medicine. What should I watch for while using this medicine? Visit your health care provider for regular checks on your progress. You may need blood work done while you are taking this medicine. Your condition will be monitored carefully while you are receiving this medicine. It is important not to miss any appointments. What side effects may I notice from receiving this medicine? Side effects that you should report to your doctor or health care professional as soon as possible:  allergic reactions (skin rash, itching or hives; swelling of the face, lips, or tongue)  bleeding (bloody or black, tarry stools; red or dark brown urine; spitting up blood or brown material that looks like coffee grounds; red spots on the skin; unusual bruising or bleeding from the eyes, gums, or nose)  blood clot (chest pain; shortness of breath; pain, swelling, or warmth in the leg)  stroke (changes in vision; confusion; trouble speaking or understanding; severe headaches; sudden numbness or weakness of the face, arm or leg; trouble walking; dizziness; loss of balance or coordination) Side effects that usually do not require medical attention (report to your doctor or health care professional if they continue or are bothersome):  diarrhea  dizziness  headache  joint pain  muscle pain  stomach pain  trouble sleeping This list may not describe all possible side effects. Call your doctor for medical advice about side effects. You may report side effects to FDA at 1-800-FDA-1088. Where should I keep my medicine? This medicine is given in a hospital or clinic. It will not be stored at home. NOTE: This sheet is a summary. It may not cover all possible   information. If you have questions about this medicine, talk to your doctor, pharmacist, or health care provider.  2021 Elsevier/Gold Standard (2019-05-19 10:28:13)  

## 2020-07-28 ENCOUNTER — Telehealth (INDEPENDENT_AMBULATORY_CARE_PROVIDER_SITE_OTHER): Payer: Self-pay

## 2020-07-28 ENCOUNTER — Encounter: Payer: Self-pay | Admitting: Internal Medicine

## 2020-07-28 ENCOUNTER — Other Ambulatory Visit: Payer: Self-pay

## 2020-07-28 ENCOUNTER — Ambulatory Visit (INDEPENDENT_AMBULATORY_CARE_PROVIDER_SITE_OTHER): Payer: 59 | Admitting: Internal Medicine

## 2020-07-28 VITALS — BP 111/71 | HR 84 | Temp 97.4°F

## 2020-07-28 DIAGNOSIS — S72001D Fracture of unspecified part of neck of right femur, subsequent encounter for closed fracture with routine healing: Secondary | ICD-10-CM

## 2020-07-28 DIAGNOSIS — D696 Thrombocytopenia, unspecified: Secondary | ICD-10-CM

## 2020-07-28 DIAGNOSIS — I10 Essential (primary) hypertension: Secondary | ICD-10-CM

## 2020-07-28 NOTE — Progress Notes (Signed)
Subjective:    Patient ID: Kathy Irwin, female    DOB: 1950-08-09, 70 y.o.   MRN: [id number]  HPI Here for follow up of her recent hospitalization. She has a history of CMML, idiopathic thrombocytopenia and hypersensitivity pneumonitis s/p subtrochanteric femur fracture in January of this year with IM nailing done by Dr. Doreatha Martin on April 30, 2020.  She was discharged to rehab, then difficulty with wound healing, drainage of the wound and placed on doxycycline for concern for infection.  No known cultures.  Wound then healed then went to the ED in march for thrombocytopenia and CT done of the hip and fluid noted.  Due to thrombocytopenia, no surgery or aspiration was done and she returned again for the same issue and ID asked to see on 07/06/20.  She had some right hip soreness then but moving better.  CT of the hip on 3/9 with minimal low-intermediate attentuation surrounding the fracture fragments likely residual hematoma or seroma.  The CT was repeated 3/23 and the same findings noted along with a soft tissue track toward the skin surface with no drainable fluid collection. CRP was 7.9.  No antibiotics were recommended with no obvious infection.   She is here now with her daughter and the main complaint is significant pain in her right hip. This has been significant since she left the hospital.  This is how it has felt for over 1 month.  She does not move much.  She is in a wheelchair today.  She has not been back to Dr. Doreatha Martin.  No fever, no chills.    Review of Systems  Constitutional: Negative for chills and fever.  Gastrointestinal: Negative for diarrhea and nausea.  Skin: Negative for rash.       Objective:   Physical Exam Constitutional:      Comments: Some distress with pain  Eyes:     General: No scleral icterus. Cardiovascular:     Rate and Rhythm: Normal rate.  Pulmonary:     Effort: Pulmonary effort is normal.  Neurological:     Mental Status: She is alert.  Psychiatric:         Mood and Affect: Mood normal.   MS: I am unable to examine hip due to pain  SH: no tobacco        Assessment & Plan:

## 2020-07-28 NOTE — Assessment & Plan Note (Signed)
bP was checked and is wnl.

## 2020-07-28 NOTE — Assessment & Plan Note (Addendum)
She is having significant pain but from my review of the scans there is no particular signs of infection other than the pain.  I will check inflammatory markers to see if it is much worse, elevated.  Will also recheck the WBC which was up to 15.2 yesterday.  I have encouraged her to have follow up with Dr. Doreatha Martin and the office phone number was provided.    ADDENDUM 07/29/20: ESR minimally elevated and CRP similar to previous with some decrease compared to March.  WBC noted and is 15.2.  Findings are generally reassuring against active significant infection though true diagnosis would be from aspriation of the hip, which is not possible at this time.  Therefore follow up with orthopedics emphasized.

## 2020-07-28 NOTE — Telephone Encounter (Signed)
Copied from [place] [id number]. Topic: Quick Communication - Rx Refill/Question >> Jul 28, 2020  2:03 PM Erick Blinks wrote: Pt's daughter called reporting that the pt is vomiting due to her current med list. The patient's daughter believes this is due to the hydrocodone. Requesting an alternative pain medication.  [id number] [address] [id number]: [phone]: [phone]

## 2020-07-28 NOTE — Assessment & Plan Note (Signed)
This is an ongoing issue that has complicated the work up.  Was 68 yesterday.

## 2020-07-28 NOTE — Telephone Encounter (Signed)
Copied from [place] [id number]. Topic: Quick Communication - Home Health Verbal Orders >> Jul 28, 2020  2:00 PM Erick Blinks wrote: Caller/Agency: Requesting women from [facility], they were very professional and kind to the patient  Callback Number:  Requesting OT/PT/Skilled Nursing/Social Work/Speech Therapy: Speech, OT, PT, skilled nursing  Frequency:   Pt's daughter is calling to request home health services from [facility]

## 2020-08-02 ENCOUNTER — Telehealth: Payer: Self-pay | Admitting: Hematology

## 2020-08-02 ENCOUNTER — Other Ambulatory Visit: Payer: Self-pay

## 2020-08-02 DIAGNOSIS — D693 Immune thrombocytopenic purpura: Secondary | ICD-10-CM

## 2020-08-02 LAB — CBC WITH DIFFERENTIAL/PLATELET
Absolute Monocytes: 8029 cells/uL — ABNORMAL HIGH (ref 200–950)
Basophils Absolute: 124 cells/uL (ref 0–200)
Basophils Relative: 0.8 %
Eosinophils Absolute: 0 cells/uL — ABNORMAL LOW (ref 15–500)
Eosinophils Relative: 0 %
HCT: 24.2 % — ABNORMAL LOW (ref 35.0–45.0)
Hemoglobin: 7.7 g/dL — ABNORMAL LOW (ref 11.7–15.5)
Lymphs Abs: 3302 cells/uL (ref 850–3900)
MCH: 28.9 pg (ref 27.0–33.0)
MCHC: 31.8 g/dL — ABNORMAL LOW (ref 32.0–36.0)
MCV: 91 fL (ref 80.0–100.0)
Monocytes Relative: 51.8 %
Neutro Abs: 4046 cells/uL (ref 1500–7800)
Neutrophils Relative %: 26.1 %
Platelets: 21 10*3/uL — ABNORMAL LOW (ref 140–400)
RBC: 2.66 10*6/uL — ABNORMAL LOW (ref 3.80–5.10)
RDW: 19.1 % — ABNORMAL HIGH (ref 11.0–15.0)
Total Lymphocyte: 21.3 %
WBC: 15.5 10*3/uL — ABNORMAL HIGH (ref 3.8–10.8)

## 2020-08-02 LAB — SEDIMENTATION RATE: Sed Rate: 39 mm/h — ABNORMAL HIGH (ref 0–30)

## 2020-08-02 LAB — C-REACTIVE PROTEIN: CRP: 70.1 mg/L — ABNORMAL HIGH (ref <8.0)

## 2020-08-02 NOTE — Telephone Encounter (Signed)
Scheduled follow-up appointments per 4/12 los. Patient is aware.

## 2020-08-03 ENCOUNTER — Inpatient Hospital Stay: Payer: 59

## 2020-08-03 ENCOUNTER — Telehealth (INDEPENDENT_AMBULATORY_CARE_PROVIDER_SITE_OTHER): Payer: Self-pay | Admitting: Primary Care

## 2020-08-03 ENCOUNTER — Other Ambulatory Visit (INDEPENDENT_AMBULATORY_CARE_PROVIDER_SITE_OTHER): Payer: Self-pay | Admitting: Primary Care

## 2020-08-03 ENCOUNTER — Other Ambulatory Visit: Payer: Self-pay

## 2020-08-03 DIAGNOSIS — C9310 Chronic myelomonocytic leukemia not having achieved remission: Secondary | ICD-10-CM | POA: Diagnosis not present

## 2020-08-03 DIAGNOSIS — D693 Immune thrombocytopenic purpura: Secondary | ICD-10-CM

## 2020-08-03 LAB — CBC WITH DIFFERENTIAL (CANCER CENTER ONLY)
Abs Immature Granulocytes: 0.4 10*3/uL — ABNORMAL HIGH (ref 0.00–0.07)
Band Neutrophils: 5 %
Basophils Absolute: 0 10*3/uL (ref 0.0–0.1)
Basophils Relative: 0 %
Blasts: 2 %
Eosinophils Absolute: 0 10*3/uL (ref 0.0–0.5)
Eosinophils Relative: 0 %
HCT: 24.1 % — ABNORMAL LOW (ref 36.0–46.0)
Hemoglobin: 7.5 g/dL — ABNORMAL LOW (ref 12.0–15.0)
Lymphocytes Relative: 37 %
Lymphs Abs: 4.7 10*3/uL — ABNORMAL HIGH (ref 0.7–4.0)
MCH: 30.6 pg (ref 26.0–34.0)
MCHC: 31.1 g/dL (ref 30.0–36.0)
MCV: 98.4 fL (ref 80.0–100.0)
Metamyelocytes Relative: 3 %
Monocytes Absolute: 4.4 10*3/uL — ABNORMAL HIGH (ref 0.1–1.0)
Monocytes Relative: 35 %
Neutro Abs: 2.9 10*3/uL (ref 1.7–7.7)
Neutrophils Relative %: 18 %
Platelet Count: 18 10*3/uL — ABNORMAL LOW (ref 150–400)
RBC: 2.45 MIL/uL — ABNORMAL LOW (ref 3.87–5.11)
RDW: 26.4 % — ABNORMAL HIGH (ref 11.5–15.5)
WBC Count: 12.6 10*3/uL — ABNORMAL HIGH (ref 4.0–10.5)
nRBC: 38 % — ABNORMAL HIGH (ref 0.0–0.2)

## 2020-08-03 LAB — CMP (CANCER CENTER ONLY)
ALT: 7 U/L (ref 0–44)
AST: 24 U/L (ref 15–41)
Albumin: 2.9 g/dL — ABNORMAL LOW (ref 3.5–5.0)
Alkaline Phosphatase: 103 U/L (ref 38–126)
Anion gap: 9 (ref 5–15)
BUN: 12 mg/dL (ref 8–23)
CO2: 22 mmol/L (ref 22–32)
Calcium: 9 mg/dL (ref 8.9–10.3)
Chloride: 109 mmol/L (ref 98–111)
Creatinine: 0.66 mg/dL (ref 0.44–1.00)
GFR, Estimated: >60 mL/min (ref >60)
Glucose, Bld: 100 mg/dL — ABNORMAL HIGH (ref 70–99)
Potassium: 4.8 mmol/L (ref 3.5–5.1)
Sodium: 140 mmol/L (ref 135–145)
Total Bilirubin: 1.3 mg/dL — ABNORMAL HIGH (ref 0.3–1.2)
Total Protein: 6.5 g/dL (ref 6.5–8.1)

## 2020-08-03 MED ORDER — ROMIPLOSTIM INJECTION 500 MCG
750.0000 ug | Freq: Once | SUBCUTANEOUS | Status: AC
  Administered 2020-08-03: 750 ug via SUBCUTANEOUS
  Filled 2020-08-03: qty 1

## 2020-08-03 NOTE — Telephone Encounter (Signed)
Called to check the status of a referral she said the doctor was supposed to send in for patient's OT/PT service.  Please advise and call to give an update at [phone]

## 2020-08-03 NOTE — Patient Instructions (Signed)
Romiplostim injection What is this medicine? ROMIPLOSTIM (roe mi PLOE stim) helps your body make more platelets. This medicine is used to treat low platelets caused by chronic idiopathic thrombocytopenic purpura (ITP) or a bone marrow syndrome caused by radiation sickness. This medicine may be used for other purposes; ask your health care provider or pharmacist if you have questions. COMMON BRAND NAME(S): Nplate What should I tell my health care provider before I take this medicine? They need to know if you have any of these conditions:  blood clots  myelodysplastic syndrome  an unusual or allergic reaction to romiplostim, mannitol, other medicines, foods, dyes, or preservatives  pregnant or trying to get pregnant  breast-feeding How should I use this medicine? This medicine is injected under the skin. It is given by a health care provider in a hospital or clinic setting. A special MedGuide will be given to you before each treatment. Be sure to read this information carefully each time. Talk to your health care provider about the use of this medicine in children. While it may be prescribed for children as young as newborns for selected conditions, precautions do apply. Overdosage: If you think you have taken too much of this medicine contact a poison control center or emergency room at once. NOTE: This medicine is only for you. Do not share this medicine with others. What if I miss a dose? Keep appointments for follow-up doses. It is important not to miss your dose. Call your health care provider if you are unable to keep an appointment. What may interact with this medicine? Interactions are not expected. This list may not describe all possible interactions. Give your health care provider a list of all the medicines, herbs, non-prescription drugs, or dietary supplements you use. Also tell them if you smoke, drink alcohol, or use illegal drugs. Some items may interact with your  medicine. What should I watch for while using this medicine? Visit your health care provider for regular checks on your progress. You may need blood work done while you are taking this medicine. Your condition will be monitored carefully while you are receiving this medicine. It is important not to miss any appointments. What side effects may I notice from receiving this medicine? Side effects that you should report to your doctor or health care professional as soon as possible:  allergic reactions (skin rash, itching or hives; swelling of the face, lips, or tongue)  bleeding (bloody or black, tarry stools; red or dark brown urine; spitting up blood or brown material that looks like coffee grounds; red spots on the skin; unusual bruising or bleeding from the eyes, gums, or nose)  blood clot (chest pain; shortness of breath; pain, swelling, or warmth in the leg)  stroke (changes in vision; confusion; trouble speaking or understanding; severe headaches; sudden numbness or weakness of the face, arm or leg; trouble walking; dizziness; loss of balance or coordination) Side effects that usually do not require medical attention (report to your doctor or health care professional if they continue or are bothersome):  diarrhea  dizziness  headache  joint pain  muscle pain  stomach pain  trouble sleeping This list may not describe all possible side effects. Call your doctor for medical advice about side effects. You may report side effects to FDA at 1-800-FDA-1088. Where should I keep my medicine? This medicine is given in a hospital or clinic. It will not be stored at home. NOTE: This sheet is a summary. It may not cover all possible  information. If you have questions about this medicine, talk to your doctor, pharmacist, or health care provider.  2021 Elsevier/Gold Standard (2019-05-19 10:28:13)

## 2020-08-04 ENCOUNTER — Inpatient Hospital Stay: Payer: 59

## 2020-08-04 LAB — PATHOLOGIST SMEAR REVIEW

## 2020-08-04 NOTE — Telephone Encounter (Signed)
Send to oncology or ask if she is able to do PT/OT.

## 2020-08-09 ENCOUNTER — Ambulatory Visit (INDEPENDENT_AMBULATORY_CARE_PROVIDER_SITE_OTHER): Payer: 59 | Admitting: Primary Care

## 2020-08-09 ENCOUNTER — Other Ambulatory Visit: Payer: Self-pay

## 2020-08-09 ENCOUNTER — Encounter (INDEPENDENT_AMBULATORY_CARE_PROVIDER_SITE_OTHER): Payer: Self-pay | Admitting: Primary Care

## 2020-08-09 ENCOUNTER — Encounter: Payer: Self-pay | Admitting: Hematology

## 2020-08-09 VITALS — BP 103/64 | HR 102 | Temp 96.4°F

## 2020-08-09 DIAGNOSIS — Z09 Encounter for follow-up examination after completed treatment for conditions other than malignant neoplasm: Secondary | ICD-10-CM | POA: Diagnosis not present

## 2020-08-09 DIAGNOSIS — R300 Dysuria: Secondary | ICD-10-CM | POA: Diagnosis not present

## 2020-08-09 DIAGNOSIS — F4323 Adjustment disorder with mixed anxiety and depressed mood: Secondary | ICD-10-CM | POA: Diagnosis not present

## 2020-08-09 DIAGNOSIS — E119 Type 2 diabetes mellitus without complications: Secondary | ICD-10-CM

## 2020-08-09 LAB — POCT GLYCOSYLATED HEMOGLOBIN (HGB A1C): Hemoglobin A1C: 6 % — AB (ref 4.0–5.6)

## 2020-08-09 LAB — GLUCOSE, POCT (MANUAL RESULT ENTRY): POC Glucose: 163 mg/dl — AB (ref 70–99)

## 2020-08-09 NOTE — Progress Notes (Signed)
HEMATOLOGY/ONCOLOGY CONSULTATION NOTE  Date of Service: 08/10/2020  Patient Care Team: Kerin Perna, NP as PCP - General (Internal Medicine) Troy Sine, MD as PCP - Cardiology (Cardiology) Bryn Gulling as Physician Assistant (Orthopedic Surgery) Haddix, Thomasene Lot, MD as Consulting Physician (Orthopedic Surgery)  CHIEF COMPLAINTS/PURPOSE OF CONSULTATION:  CMML Thrombocytopenia  HISTORY OF PRESENTING ILLNESS:   Kathy Irwin is a wonderful 70 y.o. 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 The pt reports that she is doing well overall.  The pt reports that she was diagnosed with CML by [facility] at [place] six years ago. 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 They also did a BM Bx which showed that she had chronic leukemia but she is unsure what kind. She has not needed any treatments in 6 years and was only monitored with labs. Her RBC and WBC have remained within the normal range since her diagnosis. Previously her PLTs have gotten as low as 30K but have come back up on their own. She denies any pain or issues related to her leukemia within the last 6 years.   Pt had a recent [place] infection in August. She went back to the hospital on 09/07 with bacterial Pneumonia and was discharged on 09/10. Pt notes that her breathing has improved since that hospitalization but is not back to baseline. She was sent home with oxygen and has been using it. Pt has been using oxygen for about 8 years and she is not sure why it was given to her. Pt was previously diagnosed with Interstitial Lung Disease. She says that when her symptoms worsen she is given steroids for 6 weeks. She is currently on a course of Prednisone. Pt has had sleep apnea testing which revealed that she does not have sleep apnea. Her HTN and Diabetes have been well controlled and she has been taking her medications as prescribed. She also  has home care who has been helping her walk and get some exercise. She will be going to see Dr. Ina Homes, a Pulmonologist, on 10/08.   Most recent lab results (01/09/2019) of CBC w/diff & CMP is as follows: all values are WNL except for RDW at 18.0, PLTs at 117K, nRBC Rel at 1, Glucose at 118.   On review of systems, pt denies back pain, abdominal pain and any other symptoms.   On PMHx the pt reports ILD, HTN, Diabetes, COVID19 infection, bacterial Pneumonia. On Social Hx the pt reports she is a non-smoker and does not drink alcohol  INTERVAL HISTORY:   Kathy Irwin is a wonderful 70 y.o. 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 The patient's last visit with [place] was on 07/27/2020. The pt reports that she is doing well overall. We are joined today by her sister.  The pt reports that her right leg is still in excruciating pain. The pt notes the surgical wound is healed and there is no more drainage. The pt notes she cannot walk or even lift up her right leg. The pt notes that she has not talked to her orthopaedic doctor. They gave her one pain medication but this has not worked. The pt called them back but has not heard anything from them. The pt notes that she went to her PCP yesterday and they said the infection was still in her leg. The pt's sister notes that the pt  is very depressed and immobile. She expressed concerns over the pt noting that "she would be better off not being here" and the family is very worried regarding this matter.  Lab results today 08/10/2020 of CBC w/diff and CMP is as follows: all values are WNL except for WBC of 17.0K, RBC of 2.32, Hgb of 7.2, HCT of 23.5, MCV of 101.3, RDW of 29.2, Plt of 19K, nRBC of 73.2, Glucose of 115, Albumin of 2.9, Total Bilirubin of 1.6. 08/10/2020 Immature Plt Fract of 59.3. 08/10/2020 LDH pending. 08/10/2020 Haptoglobin pending.  On review of systems, pt reports continued severe right hip pain, severe  depression, fatigue and denies lower back pain and any other symptoms.  MEDICAL HISTORY:  Past Medical History:  Diagnosis Date  . Acute respiratory failure with hypoxia ([place]) 12/2018  . Anemia, unspecified   . CML (chronic myelocytic leukemia) ([place])   . Diabetes mellitus without complication ([place])   . Esophageal reflux   . Hyperlipemia   . Hypersensitivity pneumonitis ([place]) 12/19/2017  . Hypertension   . ILD (interstitial lung disease) ([place]) 12/24/2018    SURGICAL HISTORY: Past Surgical History:  Procedure Laterality Date  . ABDOMINAL HYSTERECTOMY    . INTRAMEDULLARY (IM) NAIL INTERTROCHANTERIC Right 04/30/2020   Procedure: INTRAMEDULLARY (IM) NAIL INTERTROCHANTRIC;  Surgeon: Shona Needles, MD;  Location: [facility];  Service: Orthopedics;  Laterality: Right;  Marland Kitchen VIDEO BRONCHOSCOPY Bilateral 04/02/2019   Procedure: VIDEO BRONCHOSCOPY WITH FLUORO;  Surgeon: Marshell Garfinkel, MD;  Location: [facility];  Service: Cardiopulmonary;  Laterality: Bilateral;    SOCIAL HISTORY: Social History   Socioeconomic History  . Marital status: Married    Spouse name: Not on file  . Number of children: Not on file  . Years of education: Not on file  . Highest education level: Not on file  Occupational History  . Not on file  Tobacco Use  . Smoking status: Never Smoker  . Smokeless tobacco: Never Used  Vaping Use  . Vaping Use: Never used  Substance and Sexual Activity  . Alcohol use: Never  . Drug use: Never  . Sexual activity: Not on file  Other Topics Concern  . Not on file  Social History Narrative  . Not on file   Social Determinants of Health   Financial Resource Strain: Not on file  Food Insecurity: Not on file  Transportation Needs: Not on file  Physical Activity: Not on file  Stress: Not on file  Social Connections: Not on file  Intimate Partner Violence: Not on file    FAMILY HISTORY: Family History  Problem Relation Age of Onset  . Diabetes Other   . Hypertension  Other     ALLERGIES:  is allergic to other, ativan [lorazepam], iodine, iodine i 131 tositumomab, merbromin, and tape.  MEDICATIONS:  Current Outpatient Medications  Medication Sig Dispense Refill  . HYDROcodone-acetaminophen (NORCO) 10-325 MG tablet Take 1 tablet by mouth every 6 (six) hours as needed. 30 tablet 0  . acetaminophen (TYLENOL) 500 MG tablet Take 1,000 mg by mouth at bedtime.    Marland Kitchen albuterol (VENTOLIN HFA) 108 (90 Base) MCG/ACT inhaler INHALE 1-2 PUFFS BY MOUTH EVERY 6 HOURS AS NEEDED FOR WHEEZE OR SHORTNESS OF BREATH (Patient taking differently: Inhale 2 puffs into the lungs every 4 (four) hours as needed for wheezing or shortness of breath.) 8.5 each 1  . BD PEN NEEDLE NANO 2ND GEN 32G X 4 MM MISC USE WITH LANTUS'    . blood glucose meter kit and  supplies KIT Dispense based on patient and insurance preference. Use up to four times daily as directed. (FOR ICD-9 250.00, 250.01). For QAC - HS accuchecks. 1 each 1  . blood glucose meter kit and supplies Dispense based on patient and insurance preference. Use up to four times daily as directed. (FOR ICD-10 E10.9, E11.9). 1 each 0  . [employer] ASPIRIN ADULT LOW DOSE 81 MG chewable tablet Chew 81 mg by mouth daily.    . [employer] TUSSIN DM 200-20 MG/10ML liquid Take 5 mLs by mouth every 4 (four) hours as needed.    . D-5000 125 MCG (5000 UT) TABS Take 1 tablet by mouth daily.    Marland Kitchen doxycycline (VIBRA-TABS) 100 MG tablet Take 100 mg by mouth 2 (two) times daily.    . Ensure Max Protein (ENSURE MAX PROTEIN) LIQD Take 330 mLs (11 oz total) by mouth 3 (three) times daily. (Patient not taking: Reported on 08/09/2020)    . famotidine (PEPCID) 40 MG tablet Take 1 tablet (40 mg total) by mouth daily.    . fluticasone (FLONASE) 50 MCG/ACT nasal spray PLACE 2 SPRAYS INTO BOTH NOSTRILS DAILY AS NEEDED FOR ALLERGIES OR RHINITIS. 18.2 mL 1  . Fluticasone-Salmeterol (ADVAIR) 100-50 MCG/DOSE AEPB Inhale 1 puff into the lungs every 12 (twelve) hours. 60 each 1  .  furosemide (LASIX) 20 MG tablet Take 1 pill in the morning (Patient taking differently: Take 20 mg by mouth daily.) 90 tablet 1  . glucose blood (FREESTYLE LITE) test strip For glucose testing every before meals at bedtime. Diagnosis E 11.65  Can substitute to any accepted brand 100 each 0  . LANTUS SOLOSTAR 100 UNIT/ML Solostar Pen SMARTSIG:10 Unit(s) SUB-Q Daily    . linaclotide (LINZESS) 145 MCG CAPS capsule Take 1 capsule (145 mcg total) by mouth daily before breakfast. (Patient taking differently: Take 145 mcg by mouth daily as needed (irritable bowel syndrome).)    . metoprolol succinate (TOPROL-XL) 25 MG 24 hr tablet Take 1 tablet (25 mg total) by mouth daily. (Patient taking differently: Take 25 mg by mouth daily. Takes along 50 mg to total 75 mg) 90 tablet 1  . metoprolol succinate (TOPROL-XL) 50 MG 24 hr tablet TAKE 1 TABLET BY MOUTH EVERY DAY IN THE MORNING (Patient taking differently: Take 50 mg by mouth daily. Takes along with 25 mg to total 75 mg) 30 tablet 1  . montelukast (SINGULAIR) 10 MG tablet Take 10 mg by mouth at bedtime.    . Multiple Vitamin (MULTIVITAMIN WITH MINERALS) TABS tablet Take 1 tablet by mouth daily. (Patient taking differently: Take 1 tablet by mouth daily at 12 noon.)    . olopatadine (PATANOL) 0.1 % ophthalmic solution Place 1 drop into both eyes 2 (two) times daily. 5 mL 0  . OXYGEN Inhale 2-3 L into the lungs See admin instructions. Takes 2 L during day time if needed and 3 L at night    . pantoprazole (PROTONIX) 40 MG tablet Take 1 tablet (40 mg total) by mouth daily. 60 tablet 0  . polyethylene glycol (MIRALAX / GLYCOLAX) 17 g packet Take 17 g by mouth daily as needed for mild constipation or moderate constipation. (Patient taking differently: Take 17 g by mouth daily.)  0  . pravastatin (PRAVACHOL) 40 MG tablet Take 1 tablet (40 mg total) by mouth at bedtime. 90 tablet 1  . pregabalin (LYRICA) 100 MG capsule Take 1 capsule (100 mg total) by mouth at bedtime.  90 capsule 0  . senna-docusate (SENOKOT-S)  8.6-50 MG tablet Take 1 tablet by mouth 2 (two) times daily. 60 tablet 2  . sertraline (ZOLOFT) 100 MG tablet TAKE 1 TABLET BY MOUTH AT BEDTIME (Patient taking differently: Take 100 mg by mouth at bedtime.) 90 tablet 0  . simethicone (MYLICON) 80 MG chewable tablet Chew 1 tablet (80 mg total) by mouth 4 (four) times daily as needed for flatulence.    . traZODone (DESYREL) 50 MG tablet Take 100 mg by mouth at bedtime.    . triamcinolone cream (KENALOG) 0.1 % Apply topically every 6 (six) hours as needed.     No current facility-administered medications for this visit.    REVIEW OF SYSTEMS:   10 Point review of Systems was done is negative except as noted above.  PHYSICAL EXAMINATION: ECOG PERFORMANCE STATUS: 2 - Symptomatic, <50% confined to bed  . Vitals:   08/10/20 1021  BP: (!) 96/47  Pulse: 87  Resp: 20  Temp: 99 F (37.2 C)  SpO2: 93%   Filed Weights   .Body mass index is 38.71 kg/m.    Exam was given in a wheelchair.  GENERAL:alert, in no acute distress and comfortable SKIN: no acute rashes, no significant lesions EYES: conjunctiva are pink and non-injected, sclera anicteric OROPHARYNX: MMM, no exudates, no oropharyngeal erythema or ulceration NECK: supple, no JVD LYMPH:  no palpable lymphadenopathy in the cervical, axillary or inguinal regions LUNGS: clear to auscultation b/l with normal respiratory effort HEART: regular rate & rhythm ABDOMEN:  normoactive bowel sounds , non tender, not distended. Extremity: trace 1+ leg swelling PSYCH: alert & oriented x 3 with fluent speech NEURO: no focal motor/sensory deficits   LABORATORY DATA:  I have reviewed the data as listed  . CBC Latest Ref Rng & Units 08/10/2020 08/03/2020 07/28/2020  WBC 4.0 - 10.5 K/uL 17.0(H) 12.6(H) 15.5(H)  Hemoglobin 12.0 - 15.0 g/dL 7.2(L) 7.5(L) 7.7(L)  Hematocrit 36.0 - 46.0 % 23.5(L) 24.1(L) 24.2(L)  Platelets 150 - 400 K/uL 19(L) 18(L) 21(L)     . CMP Latest Ref Rng & Units 08/10/2020 08/03/2020 07/27/2020  Glucose 70 - 99 mg/dL 115(H) 100(H) 192(H)  BUN 8 - 23 mg/dL _0 Creatinine 0.44 - 1.00 mg/dL 0.70 0.66 0.70  Sodium 135 - 145 mmol/L 139 140 139  Potassium 3.5 - 5.1 mmol/L 4.2 4.8 4.2  Chloride 98 - 111 mmol/L 107 109 107  CO2 22 - 32 mmol/L _1 Calcium 8.9 - 10.3 mg/dL 9.2 9.0 9.1  Total Protein 6.5 - 8.1 g/dL 6.5 6.5 6.4(L)  Total Bilirubin 0.3 - 1.2 mg/dL 1.6(H) 1.3(H) 1.4(H)  Alkaline Phos 38 - 126 U/L 87 103 100  AST 15 - 41 U/L _2 ALT 0 - 44 U/L 6 7 <6   01/21/2019 FISH:    RADIOGRAPHIC STUDIES: I have personally reviewed the radiological images as listed and agreed with the findings in the report. DG Chest 2 View  Result Date: 07/14/2020 CLINICAL DATA:  Concern for mediastinal widening on chest x-ray obtained earlier today. Hypoxia. EXAM: CHEST - 2 VIEW COMPARISON:  Portable AP view earlier today.  Chest CT 05/02/2020. FINDINGS: Borderline cardiomegaly. Mediastinal widening on prior exam is related to aortic tortuosity. Similar mediastinal appearance seen on prior exams. Mild background interstitial coarsening, with slight patchy opacity at the lung bases. No pneumothorax or significant pleural effusion. No acute osseous abnormalities are seen. IMPRESSION: 1. The questioned mediastinal widening on prior exam is related to tortuous thoracic aorta. 2. Mild  background interstitial coarsening with slight patchy opacity at the lung bases, may be atelectasis or pulmonary edema. There may be underlying interstitial lung disease, that is not well assessed on the current exam. Electronically Signed   By: Keith Rake M.D.   On: 07/14/2020 19:11   DG CHEST PORT 1 VIEW  Result Date: 07/14/2020 CLINICAL DATA:  Hypoxia. EXAM: PORTABLE CHEST 1 VIEW COMPARISON:  Chest x-ray 05/10/2020. FINDINGS: One Ng of the upper mediastinum. Although this may be related to prominent great vessels and portable technique,  PA and lateral chest x-ray suggested for further evaluation. If mediastinal prominence persist on PA lateral chest x-ray, contrast-enhanced chest CT suggested to further evaluate. Heart size stable. Bilateral interstitial prominence. Interstitial edema and/or pneumonitis could present in this fashion. No pleural effusion or pneumothorax. IMPRESSION: 1. Widening of the upper mediastinum. Although this may be related to prominent great vessels and portable technique, PA and lateral chest x-ray suggested for further evaluation. If mediastinal prominence persists on PA and lateral chest x-ray, contrast-enhanced chest CT suggested to further evaluate. 2. Heart size stable. Bilateral interstitial prominence. Interstitial edema and/or pneumonitis could present in this fashion. Electronically Signed   By: Marcello Moores  Register   On: 07/14/2020 15:18    ASSESSMENT & PLAN:   1) CMML-1 2)Thrombocytopenia-- likely multifactorial.  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 ITP is likely since platelets improved significantly with recent steroid use. Additional factors worsening her platelet count are infection in her right hip surgical site with drainage and absorption of platelets, multiple lines of antibiotics etc.  PLAN:  -Discussed pt labwork today, 08/10/2020; very anemic, Plt low but stable despite high doses of nPlate. -Advised pt that the idea currently is to treat the CMML at this time as it is affecting her blood counts greatly. The counts will not respond unless this is treated at this time. -Advised pt that she would be getting Vidaza as treatment for her CMML. 5 days every month followed by weekly labs. The pt is agreeable to this and willing to start chemotherapy. -Advised pt that her Hgb of 7.3 is in need of transfusion support. Will schedule pt for the soonest appt. -Continue to f/u w orthopaedics, infectious disease, and Pulmonologist. -Recommended  again for pt to f/u w orthopaedics for pain management and additional plan for healing. -Advised pt that the chemotherapy could potentially flare her infection further. Discussed fluid overload, transfusion support, and other issues related to potential treatment. -Continue nPlate q1week. Advised pt that even at a high dose, the Plt are not responding very well. -Will see back in 2 weeks after C1D1.    FOLLOW UP: Please schedule for 2 units of PRBCs as soon as possible. Please schedule to start Vidaza cycle 1 day 1 on 08/16/2020 with labs (5 days from Monday through Friday every 4 weeks) Please continue weekly labs with Nplate and 1 unit of PRBC if needed every week x6 weeks. MD visit 14 days after cycle 1 day 1 of Vidaza for toxicity check     The total time spent in the appointment was 40 minutes and more than 50% was on counseling and direct patient cares.    All of the patient's questions were answered with apparent satisfaction. The patient knows to call the clinic with any problems, questions or concerns.   Sullivan Lone MD [place] AAHIVMS [facility] Hematology/Oncology Physician [place]  (Office):  [phone] (Work cell):  [phone] (Fax):           [phone]  08/10/2020 11:16 AM  I, Reinaldo Raddle, am acting as scribe for Dr. Sullivan Lone, MD.    .I have reviewed the above documentation for accuracy and completeness, and I agree with the above. Brunetta Genera MD

## 2020-08-09 NOTE — Progress Notes (Signed)
Called pt to introduce myself as her Arboriculturist and to discuss the [employer].  Pt has 2 insurances so copay assistance shouldn't be needed.  I left a msg requesting she return my call if she's interested in applying for the grant because the call dropped and when I called back it went straight to vm.

## 2020-08-09 NOTE — Progress Notes (Signed)
Hospital follow-up   HPI  [name] is a  70 y.o.female presents for follow up from the hospital. Admit date to the hospital was 07/05/20, patient was discharged from the hospital on 07/21/20, patient was admitted for: Thrombocytopenia (HCC) and Closed fracture of right hip, sequela, Postoperative wound infection of right hip.  She was sent to rehab a nursing facility to continue antibiotics and dressing changes.  She is in a wheelchair unable to walk wheelchair-bound at this time.  Requesting orders for physical therapy denied advised her to follow-up with orthopedist to determine if she is ready or stable enough to have physical therapy.  She became a little upset with provider explained after a total hip replacement and infection would not risk of making the wrong decision for her to have physical therapy and she has not healed or stable enough to endure physical therapy.  Past Medical History:  Diagnosis Date  . Acute respiratory failure with hypoxia ([place]) 12/2018  . Anemia, unspecified   . CML (chronic myelocytic leukemia) ([place])   . Diabetes mellitus without complication ([place])   . Esophageal reflux   . Hyperlipemia   . Hypersensitivity pneumonitis ([place]) 12/19/2017  . Hypertension   . ILD (interstitial lung disease) ([place]) 12/24/2018     Allergies  Allergen Reactions  . Other Shortness Of Breath and Other (See Comments)    Newspaper ink =  new chest pain, also  . Ativan [Lorazepam] Other (See Comments)    Severe confusion and agitation.   . Iodine Other (See Comments)    Unknown reaction  . Iodine I 131 Tositumomab   . Merbromin Other (See Comments)    Unknown reaction - Mercurochrome- "Was a long time ago"   . Tape Rash      Current Outpatient Medications on File Prior to Visit  Medication Sig Dispense Refill  . acetaminophen (TYLENOL) 500 MG tablet Take 1,000 mg by mouth at bedtime.    Marland Kitchen albuterol (VENTOLIN HFA) 108 (90 Base) MCG/ACT inhaler INHALE 1-2 PUFFS BY MOUTH EVERY  6 HOURS AS NEEDED FOR WHEEZE OR SHORTNESS OF BREATH (Patient taking differently: Inhale 2 puffs into the lungs every 4 (four) hours as needed for wheezing or shortness of breath.) 8.5 each 1  . BD PEN NEEDLE NANO 2ND GEN 32G X 4 MM MISC USE WITH LANTUS'    . blood glucose meter kit and supplies KIT Dispense based on patient and insurance preference. Use up to four times daily as directed. (FOR ICD-9 250.00, 250.01). For QAC - HS accuchecks. 1 each 1  . blood glucose meter kit and supplies Dispense based on patient and insurance preference. Use up to four times daily as directed. (FOR ICD-10 E10.9, E11.9). 1 each 0  . [employer] ASPIRIN ADULT LOW DOSE 81 MG chewable tablet Chew 81 mg by mouth daily.    . [employer] TUSSIN DM 200-20 MG/10ML liquid Take 5 mLs by mouth every 4 (four) hours as needed.    . D-5000 125 MCG (5000 UT) TABS Take 1 tablet by mouth daily.    Marland Kitchen doxycycline (VIBRA-TABS) 100 MG tablet Take 100 mg by mouth 2 (two) times daily.    . famotidine (PEPCID) 40 MG tablet Take 1 tablet (40 mg total) by mouth daily.    . fluticasone (FLONASE) 50 MCG/ACT nasal spray PLACE 2 SPRAYS INTO BOTH NOSTRILS DAILY AS NEEDED FOR ALLERGIES OR RHINITIS. 18.2 mL 1  . Fluticasone-Salmeterol (ADVAIR) 100-50 MCG/DOSE AEPB Inhale 1 puff into the lungs every 12 (  twelve) hours. 60 each 1  . furosemide (LASIX) 20 MG tablet Take 1 pill in the morning (Patient taking differently: Take 20 mg by mouth daily.) 90 tablet 1  . glucose blood (FREESTYLE LITE) test strip For glucose testing every before meals at bedtime. Diagnosis E 11.65  Can substitute to any accepted brand 100 each 0  . LANTUS SOLOSTAR 100 UNIT/ML Solostar Pen SMARTSIG:10 Unit(s) SUB-Q Daily    . linaclotide (LINZESS) 145 MCG CAPS capsule Take 1 capsule (145 mcg total) by mouth daily before breakfast. (Patient taking differently: Take 145 mcg by mouth daily as needed (irritable bowel syndrome).)    . metoprolol succinate (TOPROL-XL) 25 MG 24 hr tablet Take 1 tablet  (25 mg total) by mouth daily. (Patient taking differently: Take 25 mg by mouth daily. Takes along 50 mg to total 75 mg) 90 tablet 1  . metoprolol succinate (TOPROL-XL) 50 MG 24 hr tablet TAKE 1 TABLET BY MOUTH EVERY DAY IN THE MORNING (Patient taking differently: Take 50 mg by mouth daily. Takes along with 25 mg to total 75 mg) 30 tablet 1  . montelukast (SINGULAIR) 10 MG tablet Take 10 mg by mouth at bedtime.    . Multiple Vitamin (MULTIVITAMIN WITH MINERALS) TABS tablet Take 1 tablet by mouth daily. (Patient taking differently: Take 1 tablet by mouth daily at 12 noon.)    . olopatadine (PATANOL) 0.1 % ophthalmic solution Place 1 drop into both eyes 2 (two) times daily. 5 mL 0  . OXYGEN Inhale 2-3 L into the lungs See admin instructions. Takes 2 L during day time if needed and 3 L at night    . pantoprazole (PROTONIX) 40 MG tablet Take 1 tablet (40 mg total) by mouth daily. 60 tablet 0  . polyethylene glycol (MIRALAX / GLYCOLAX) 17 g packet Take 17 g by mouth daily as needed for mild constipation or moderate constipation. (Patient taking differently: Take 17 g by mouth daily.)  0  . pravastatin (PRAVACHOL) 40 MG tablet Take 1 tablet (40 mg total) by mouth at bedtime. 90 tablet 1  . pregabalin (LYRICA) 100 MG capsule Take 1 capsule (100 mg total) by mouth at bedtime. 90 capsule 0  . senna-docusate (SENOKOT-S) 8.6-50 MG tablet Take 1 tablet by mouth 2 (two) times daily. 60 tablet 2  . sertraline (ZOLOFT) 100 MG tablet TAKE 1 TABLET BY MOUTH AT BEDTIME (Patient taking differently: Take 100 mg by mouth at bedtime.) 90 tablet 0  . simethicone (MYLICON) 80 MG chewable tablet Chew 1 tablet (80 mg total) by mouth 4 (four) times daily as needed for flatulence.    . traZODone (DESYREL) 50 MG tablet Take 100 mg by mouth at bedtime.    . triamcinolone cream (KENALOG) 0.1 % Apply topically every 6 (six) hours as needed.    . Ensure Max Protein (ENSURE MAX PROTEIN) LIQD Take 330 mLs (11 oz total) by mouth 3  (three) times daily. (Patient not taking: Reported on 08/09/2020)     No current facility-administered medications on file prior to visit.    ROS:  Review of Systems  Respiratory: Positive for cough and shortness of breath.   Gastrointestinal: Positive for constipation.  Genitourinary: Positive for dysuria.  Musculoskeletal:       Right hip pain unable to walk uses a wheel chair   Neurological: Positive for weakness.  All other systems reviewed and are negative.   Physical Exam: There were no vitals filed for this visit. BP 103/64 (BP Location: Right Arm,  Patient Position: Sitting, Cuff Size: Large)   Pulse (!) 102   Temp (!) 96.4 F (35.8 C) (Temporal)   SpO2 95%  General Appearance: Well nourished, in mild  distress. Eyes: PERRLA, EOMs, conjunctiva no swelling or erythema Sinuses: No Frontal/maxillary tenderness ENT/Mouth: Ext aud canals clear, TMs without erythema, bulging. Marland Kitchen Hearing normal.  Neck: Supple, thyroid normal.  Respiratory: Respiratory effort normal, BS equal bilaterally without rales, rhonchi, wheezing or stridor.  Cardio: RRR with no MRGs. Brisk peripheral pulses without edema.  Abdomen: Soft, + BS. Lymphatics: Non tender without lymphadenopathy.  Musculoskeletal: unable to walk wheel chair bound at this visit t.  Skin: Warm, dry without rashes, lesions, ecchymosis.  Psych: Awake and oriented X 3, normal affect, Insight and Judgment appropriate.   Kathy Irwin was seen today for hospitalization follow-up.  Diagnoses and all orders for this visit:  Type 2 diabetes mellitus without complication, without long-term current use of insulin (HCC) -     HgB A1c 6.0.  Well-controlled on Lantus 10 units daily.  She is at goal of therapy: Less than 6.5 hemoglobin A1c.  Monitor foods that are high in carbohydrates are the following rice, potatoes, breads, sugars, and pastas.  Reduction in the intake (eating) will assist in lowering your blood sugars. -     Glucose  (CBG)  Dysuria Unable to obtain urine -     POCT URINALYSIS DIP (CLINITEK)  Adjustment disorder with mixed anxiety and depressed mood Flowsheet Row Office Visit from 08/09/2020 in [facility]  PHQ-9 Total Score 15    Refer to behavioral health for counseling and treatment.  Frustrated and down after hip surgery.  Hospital discharge follow-up Schedule follow-up with PCP completed.  Followed by oncology for thrombocytopenia and infectious disease for infection of the hip status post surgery  Kerin Perna, NP 2:52 PM

## 2020-08-10 ENCOUNTER — Other Ambulatory Visit: Payer: Self-pay | Admitting: *Deleted

## 2020-08-10 ENCOUNTER — Other Ambulatory Visit: Payer: Self-pay | Admitting: Hematology

## 2020-08-10 ENCOUNTER — Inpatient Hospital Stay: Payer: 59

## 2020-08-10 ENCOUNTER — Inpatient Hospital Stay (HOSPITAL_BASED_OUTPATIENT_CLINIC_OR_DEPARTMENT_OTHER): Payer: 59 | Admitting: Hematology

## 2020-08-10 VITALS — BP 123/54 | HR 98 | Temp 99.3°F | Resp 24

## 2020-08-10 VITALS — BP 96/47 | HR 87 | Temp 99.0°F | Resp 20 | Ht 64.0 in

## 2020-08-10 DIAGNOSIS — D649 Anemia, unspecified: Secondary | ICD-10-CM

## 2020-08-10 DIAGNOSIS — D693 Immune thrombocytopenic purpura: Secondary | ICD-10-CM

## 2020-08-10 DIAGNOSIS — C9310 Chronic myelomonocytic leukemia not having achieved remission: Secondary | ICD-10-CM

## 2020-08-10 LAB — CMP (CANCER CENTER ONLY)
ALT: 6 U/L (ref 0–44)
AST: 28 U/L (ref 15–41)
Albumin: 2.9 g/dL — ABNORMAL LOW (ref 3.5–5.0)
Alkaline Phosphatase: 87 U/L (ref 38–126)
Anion gap: 9 (ref 5–15)
BUN: 10 mg/dL (ref 8–23)
CO2: 23 mmol/L (ref 22–32)
Calcium: 9.2 mg/dL (ref 8.9–10.3)
Chloride: 107 mmol/L (ref 98–111)
Creatinine: 0.7 mg/dL (ref 0.44–1.00)
GFR, Estimated: >60 mL/min (ref >60)
Glucose, Bld: 115 mg/dL — ABNORMAL HIGH (ref 70–99)
Potassium: 4.2 mmol/L (ref 3.5–5.1)
Sodium: 139 mmol/L (ref 135–145)
Total Bilirubin: 1.6 mg/dL — ABNORMAL HIGH (ref 0.3–1.2)
Total Protein: 6.5 g/dL (ref 6.5–8.1)

## 2020-08-10 LAB — PREPARE RBC (CROSSMATCH)

## 2020-08-10 LAB — CBC WITH DIFFERENTIAL/PLATELET
Abs Immature Granulocytes: 1.38 10*3/uL — ABNORMAL HIGH (ref 0.00–0.07)
Basophils Absolute: 0.2 10*3/uL — ABNORMAL HIGH (ref 0.0–0.1)
Basophils Relative: 1 %
Eosinophils Absolute: 0 10*3/uL (ref 0.0–0.5)
Eosinophils Relative: 0 %
HCT: 23.5 % — ABNORMAL LOW (ref 36.0–46.0)
Hemoglobin: 7.2 g/dL — ABNORMAL LOW (ref 12.0–15.0)
Immature Granulocytes: 8 %
Lymphocytes Relative: 22 %
Lymphs Abs: 3.7 10*3/uL (ref 0.7–4.0)
MCH: 31 pg (ref 26.0–34.0)
MCHC: 30.6 g/dL (ref 30.0–36.0)
MCV: 101.3 fL — ABNORMAL HIGH (ref 80.0–100.0)
Monocytes Absolute: 8.9 10*3/uL — ABNORMAL HIGH (ref 0.1–1.0)
Monocytes Relative: 53 %
Neutro Abs: 2.8 10*3/uL (ref 1.7–7.7)
Neutrophils Relative %: 16 %
Platelets: 19 10*3/uL — ABNORMAL LOW (ref 150–400)
RBC: 2.32 MIL/uL — ABNORMAL LOW (ref 3.87–5.11)
RDW: 29.2 % — ABNORMAL HIGH (ref 11.5–15.5)
WBC: 17 10*3/uL — ABNORMAL HIGH (ref 4.0–10.5)
nRBC: 73.2 % — ABNORMAL HIGH (ref 0.0–0.2)

## 2020-08-10 LAB — SAMPLE TO BLOOD BANK

## 2020-08-10 LAB — IMMATURE PLATELET FRACTION: Immature Platelet Fraction: 59.3 % — ABNORMAL HIGH (ref 1.2–8.6)

## 2020-08-10 LAB — LACTATE DEHYDROGENASE: LDH: 1172 U/L — ABNORMAL HIGH (ref 98–192)

## 2020-08-10 MED ORDER — HYDROCODONE-ACETAMINOPHEN 10-325 MG PO TABS: 1.0000 | ORAL_TABLET | Freq: Four times a day (QID) | ORAL | 0 refills | Status: DC | PRN

## 2020-08-10 MED ORDER — ROMIPLOSTIM INJECTION 500 MCG
820.0000 ug | Freq: Once | SUBCUTANEOUS | Status: AC
Start: 2020-08-10 — End: 2020-08-10
  Administered 2020-08-10: 820 ug via SUBCUTANEOUS
  Filled 2020-08-10: qty 1.64

## 2020-08-10 MED ORDER — SODIUM CHLORIDE 0.9% IV SOLUTION
250.0000 mL | Freq: Once | INTRAVENOUS | Status: DC
  Filled 2020-08-10: qty 250

## 2020-08-10 MED ORDER — DIPHENHYDRAMINE HCL 25 MG PO CAPS
ORAL_CAPSULE | ORAL | Status: AC
  Filled 2020-08-10: qty 1

## 2020-08-10 MED ORDER — ACETAMINOPHEN 325 MG PO TABS
650.0000 mg | ORAL_TABLET | Freq: Once | ORAL | Status: AC
  Administered 2020-08-10: 650 mg via ORAL

## 2020-08-10 MED ORDER — DIPHENHYDRAMINE HCL 25 MG PO CAPS
25.0000 mg | ORAL_CAPSULE | Freq: Once | ORAL | Status: AC
  Administered 2020-08-10: 25 mg via ORAL

## 2020-08-10 MED ORDER — ACETAMINOPHEN 325 MG PO TABS
ORAL_TABLET | ORAL | Status: AC
  Filled 2020-08-10: qty 2

## 2020-08-10 NOTE — Patient Instructions (Signed)
Blood Transfusion, Adult, Care After This sheet gives you information about how to care for yourself after your procedure. Your doctor may also give you more specific instructions. If you have problems or questions, contact your doctor. What can I expect after the procedure? After the procedure, it is common to have:  Bruising and soreness at the IV site.  A fever or chills on the day of the procedure. This may be your body's response to the new blood cells received.  A headache. Follow these instructions at home: Insertion site care  Follow instructions from your doctor about how to take care of your insertion site. This is where an IV tube was put into your vein. Make sure you: ? Wash your hands with soap and water before and after you change your bandage (dressing). If you cannot use soap and water, use hand sanitizer. ? Change your bandage as told by your doctor.  Check your insertion site every day for signs of infection. Check for: ? Redness, swelling, or pain. ? Bleeding from the site. ? Warmth. ? Pus or a bad smell.      General instructions  Take over-the-counter and prescription medicines only as told by your doctor.  Rest as told by your doctor.  Go back to your normal activities as told by your doctor.  Keep all follow-up visits as told by your doctor. This is important. Contact a doctor if:  You have itching or red, swollen areas of skin (hives).  You feel worried or nervous (anxious).  You feel weak after doing your normal activities.  You have redness, swelling, warmth, or pain around the insertion site.  You have blood coming from the insertion site, and the blood does not stop with pressure.  You have pus or a bad smell coming from the insertion site. Get help right away if:  You have signs of a serious reaction. This may be coming from an allergy or the body's defense system (immune system). Signs include: ? Trouble breathing or shortness of  breath. ? Swelling of the face or feeling warm (flushed). ? Fever or chills. ? Head, chest, or back pain. ? Dark pee (urine) or blood in the pee. ? Widespread rash. ? Fast heartbeat. ? Feeling dizzy or light-headed. You may receive your blood transfusion in an outpatient setting. If so, you will be told whom to contact to report any reactions. These symptoms may be an emergency. Do not wait to see if the symptoms will go away. Get medical help right away. Call your local emergency services (911 in the [place] Do not drive yourself to the hospital. Summary  Bruising and soreness at the IV site are common.  Check your insertion site every day for signs of infection.  Rest as told by your doctor. Go back to your normal activities as told by your doctor.  Get help right away if you have signs of a serious reaction. This information is not intended to replace advice given to you by your health care provider. Make sure you discuss any questions you have with your health care provider. Document Revised: 09/26/2018 Document Reviewed: 09/26/2018 Elsevier Patient Education  [place].

## 2020-08-11 ENCOUNTER — Telehealth: Payer: Self-pay | Admitting: Cardiovascular Disease

## 2020-08-11 LAB — TYPE AND SCREEN
ABO/RH(D): AB POS
Antibody Screen: NEGATIVE
Unit division: 0
Unit division: 0

## 2020-08-11 LAB — BPAM RBC
Blood Product Expiration Date: 202205132359
Blood Product Expiration Date: 202205172359
ISSUE DATE / TIME: 202204261134
ISSUE DATE / TIME: 202204261526
Unit Type and Rh: 6200
Unit Type and Rh: 6200

## 2020-08-11 LAB — HAPTOGLOBIN: Haptoglobin: <10 mg/dL — ABNORMAL LOW (ref 37–355)

## 2020-08-11 NOTE — Telephone Encounter (Signed)
Kathy Irwin, physical therapist, with [facility] calling for clarification of the patient's weight and ankle circumference parameters. She states due to the patient's CHF diagnosis they are required to weigh her. She states the information can be left in her voicemail, because it is confidential. Phone: [phone]

## 2020-08-11 NOTE — Telephone Encounter (Signed)
Returned call to [place] with [place] left message on personal voice mail at patient's last office visit 05/06/2019 she weighed 267 lbs.She has appointment scheduled with [facility] 08/24/20.

## 2020-08-12 ENCOUNTER — Telehealth (INDEPENDENT_AMBULATORY_CARE_PROVIDER_SITE_OTHER): Payer: Self-pay | Admitting: Primary Care

## 2020-08-12 ENCOUNTER — Other Ambulatory Visit (INDEPENDENT_AMBULATORY_CARE_PROVIDER_SITE_OTHER): Payer: Self-pay | Admitting: Primary Care

## 2020-08-12 DIAGNOSIS — R5381 Other malaise: Secondary | ICD-10-CM

## 2020-08-12 NOTE — Telephone Encounter (Signed)
Pt sister calling and [facility]  Nurse happened to be at the home and wanted to speak to agent. She states she ([facility]) has been at home for past 40 min and pt trying to void for a urine culture. Unable to do so as wet herself and bed this am before arrival. Nurse is asking for a verbal order to be sent out to her, Katheran James, at secured line [phone]. Pls send another visit request for purpose of culture for tomorrow, 4/29.

## 2020-08-12 NOTE — Telephone Encounter (Signed)
Verbal order given for UA and UC.

## 2020-08-12 NOTE — Telephone Encounter (Signed)
Patient has schedule an appointment for May 9th @ 10:10 am.

## 2020-08-13 NOTE — Progress Notes (Signed)
START OFF PATHWAY REGIMEN - Other   [id number]:Azacitidine 75 mg/m2 SUBQ D1-5 q28 Days:   A cycle is every 28 days:     Azacitidine   **Always confirm dose/schedule in your pharmacy ordering system**  Patient Characteristics: Intent of Therapy: Non-Curative / Palliative Intent, Discussed with Patient

## 2020-08-16 ENCOUNTER — Other Ambulatory Visit: Payer: Self-pay

## 2020-08-16 ENCOUNTER — Inpatient Hospital Stay: Payer: 59

## 2020-08-16 ENCOUNTER — Inpatient Hospital Stay: Payer: 59 | Attending: Hematology

## 2020-08-16 VITALS — BP 113/53 | HR 92 | Temp 99.0°F | Resp 22

## 2020-08-16 DIAGNOSIS — J849 Interstitial pulmonary disease, unspecified: Secondary | ICD-10-CM | POA: Diagnosis not present

## 2020-08-16 DIAGNOSIS — G4730 Sleep apnea, unspecified: Secondary | ICD-10-CM | POA: Insufficient documentation

## 2020-08-16 DIAGNOSIS — K219 Gastro-esophageal reflux disease without esophagitis: Secondary | ICD-10-CM | POA: Insufficient documentation

## 2020-08-16 DIAGNOSIS — E119 Type 2 diabetes mellitus without complications: Secondary | ICD-10-CM | POA: Diagnosis not present

## 2020-08-16 DIAGNOSIS — D649 Anemia, unspecified: Secondary | ICD-10-CM

## 2020-08-16 DIAGNOSIS — B9562 Methicillin resistant Staphylococcus aureus infection as the cause of diseases classified elsewhere: Secondary | ICD-10-CM | POA: Insufficient documentation

## 2020-08-16 DIAGNOSIS — C9310 Chronic myelomonocytic leukemia not having achieved remission: Secondary | ICD-10-CM

## 2020-08-16 DIAGNOSIS — I10 Essential (primary) hypertension: Secondary | ICD-10-CM | POA: Diagnosis not present

## 2020-08-16 DIAGNOSIS — D630 Anemia in neoplastic disease: Secondary | ICD-10-CM | POA: Insufficient documentation

## 2020-08-16 DIAGNOSIS — Z79899 Other long term (current) drug therapy: Secondary | ICD-10-CM | POA: Insufficient documentation

## 2020-08-16 DIAGNOSIS — I509 Heart failure, unspecified: Secondary | ICD-10-CM | POA: Diagnosis not present

## 2020-08-16 DIAGNOSIS — D693 Immune thrombocytopenic purpura: Secondary | ICD-10-CM

## 2020-08-16 DIAGNOSIS — Z5112 Encounter for antineoplastic immunotherapy: Secondary | ICD-10-CM | POA: Insufficient documentation

## 2020-08-16 DIAGNOSIS — N390 Urinary tract infection, site not specified: Secondary | ICD-10-CM | POA: Insufficient documentation

## 2020-08-16 DIAGNOSIS — Z8616 Personal history of COVID-19: Secondary | ICD-10-CM | POA: Insufficient documentation

## 2020-08-16 DIAGNOSIS — C9110 Chronic lymphocytic leukemia of B-cell type not having achieved remission: Secondary | ICD-10-CM | POA: Insufficient documentation

## 2020-08-16 DIAGNOSIS — Z7189 Other specified counseling: Secondary | ICD-10-CM

## 2020-08-16 LAB — CBC WITH DIFFERENTIAL (CANCER CENTER ONLY)
Abs Immature Granulocytes: 1.03 10*3/uL — ABNORMAL HIGH (ref 0.00–0.07)
Basophils Absolute: 0.1 10*3/uL (ref 0.0–0.1)
Basophils Relative: 1 %
Eosinophils Absolute: 0 10*3/uL (ref 0.0–0.5)
Eosinophils Relative: 0 %
HCT: 26.4 % — ABNORMAL LOW (ref 36.0–46.0)
Hemoglobin: 8.1 g/dL — ABNORMAL LOW (ref 12.0–15.0)
Immature Granulocytes: 7 %
Lymphocytes Relative: 32 %
Lymphs Abs: 4.8 10*3/uL — ABNORMAL HIGH (ref 0.7–4.0)
MCH: 30.2 pg (ref 26.0–34.0)
MCHC: 30.7 g/dL (ref 30.0–36.0)
MCV: 98.5 fL (ref 80.0–100.0)
Monocytes Absolute: 6.2 10*3/uL — ABNORMAL HIGH (ref 0.1–1.0)
Monocytes Relative: 41 %
Neutro Abs: 2.9 10*3/uL (ref 1.7–7.7)
Neutrophils Relative %: 19 %
Platelet Count: 18 10*3/uL — ABNORMAL LOW (ref 150–400)
RBC: 2.68 MIL/uL — ABNORMAL LOW (ref 3.87–5.11)
RDW: 25.2 % — ABNORMAL HIGH (ref 11.5–15.5)
WBC Count: 15 10*3/uL — ABNORMAL HIGH (ref 4.0–10.5)
nRBC: 36.2 % — ABNORMAL HIGH (ref 0.0–0.2)

## 2020-08-16 LAB — CMP (CANCER CENTER ONLY)
ALT: <6 U/L (ref 0–44)
AST: 23 U/L (ref 15–41)
Albumin: 2.6 g/dL — ABNORMAL LOW (ref 3.5–5.0)
Alkaline Phosphatase: 142 U/L — ABNORMAL HIGH (ref 38–126)
Anion gap: 8 (ref 5–15)
BUN: 15 mg/dL (ref 8–23)
CO2: 24 mmol/L (ref 22–32)
Calcium: 9.1 mg/dL (ref 8.9–10.3)
Chloride: 106 mmol/L (ref 98–111)
Creatinine: 0.71 mg/dL (ref 0.44–1.00)
GFR, Estimated: >60 mL/min (ref >60)
Glucose, Bld: 154 mg/dL — ABNORMAL HIGH (ref 70–99)
Potassium: 4.3 mmol/L (ref 3.5–5.1)
Sodium: 138 mmol/L (ref 135–145)
Total Bilirubin: 1.9 mg/dL — ABNORMAL HIGH (ref 0.3–1.2)
Total Protein: 6.4 g/dL — ABNORMAL LOW (ref 6.5–8.1)

## 2020-08-16 MED ORDER — ONDANSETRON HCL 8 MG PO TABS
ORAL_TABLET | ORAL | Status: AC
  Filled 2020-08-16: qty 1

## 2020-08-16 MED ORDER — ONDANSETRON HCL 8 MG PO TABS
8.0000 mg | ORAL_TABLET | Freq: Once | ORAL | Status: AC
  Administered 2020-08-16: 8 mg via ORAL

## 2020-08-16 MED ORDER — AZACITIDINE CHEMO SQ INJECTION
100.0000 mg | Freq: Once | INTRAMUSCULAR | Status: AC
  Administered 2020-08-16: 100 mg via SUBCUTANEOUS
  Filled 2020-08-16: qty 4

## 2020-08-16 NOTE — Progress Notes (Signed)
Per Dr Irene Limbo, ok to treat with today's labs.

## 2020-08-16 NOTE — Patient Instructions (Signed)
[facility] ONCOLOG[facility]  Discharge Instructions: Thank you for choosing [facility] to provide your oncology and hematology care.   If you have a lab appointment with the [facility], please go directly to the [facility] and check in at the registration area.   Wear comfortable clothing and clothing appropriate for easy access to any Portacath or PICC line.   We strive to give you quality time with your provider. You may need to reschedule your appointment if you arrive late (15 or more minutes).  Arriving late affects you and other patients whose appointments are after yours.  Also, if you miss three or more appointments without notifying the office, you may be dismissed from the clinic at the provider's discretion.      For prescription refill requests, have your pharmacy contact our office and allow 72 hours for refills to be completed.    Today you received the following chemotherapy and/or immunotherapy agents vidaza      To help prevent nausea and vomiting after your treatment, we encourage you to take your nausea medication as directed.  BELOW ARE SYMPTOMS THAT SHOULD BE REPORTED IMMEDIATELY: . *FEVER GREATER THAN 100.4 F (38 C) OR HIGHER . *CHILLS OR SWEATING . *NAUSEA AND VOMITING THAT IS NOT CONTROLLED WITH YOUR NAUSEA MEDICATION . *UNUSUAL SHORTNESS OF BREATH . *UNUSUAL BRUISING OR BLEEDING . *URINARY PROBLEMS (pain or burning when urinating, or frequent urination) . *BOWEL PROBLEMS (unusual diarrhea, constipation, pain near the anus) . TENDERNESS IN MOUTH AND THROAT WITH OR WITHOUT PRESENCE OF ULCERS (sore throat, sores in mouth, or a toothache) . UNUSUAL RASH, SWELLING OR PAIN  . UNUSUAL VAGINAL DISCHARGE OR ITCHING   Items with * indicate a potential emergency and should be followed up as soon as possible or go to the Emergency Department if any problems should occur.  Please show the CHEMOTHERAPY ALERT CARD or IMMUNOTHERAPY ALERT CARD  at check-in to the Emergency Department and triage nurse.  Should you have questions after your visit or need to cancel or reschedule your appointment, please contact [facility]  Dept: [phone]  and follow the prompts.  Office hours are 8:00 a.m. to 4:30 p.m. Monday - Friday. Please note that voicemails left after 4:00 p.m. may not be returned until the following business day.  We are closed weekends and major holidays. You have access to a nurse at all times for urgent questions. Please call the main number to the clinic Dept: [phone] and follow the prompts.   For any non-urgent questions, you may also contact your provider using MyChart. We now offer e-Visits for anyone 110 and older to request care online for non-urgent symptoms. For details visit [email or web address].   Also download the MyChart app! Go to the app store, search "MyChart", open the app, select [facility], and log in with your MyChart username and password.  Due to Covid, a mask is required upon entering the hospital/clinic. If you do not have a mask, one will be given to you upon arrival. For doctor visits, patients may have 1 support person aged 72 or older with them. For treatment visits, patients cannot have anyone with them due to current Covid guidelines and our immunocompromised population.

## 2020-08-17 ENCOUNTER — Other Ambulatory Visit: Payer: 59

## 2020-08-17 ENCOUNTER — Other Ambulatory Visit: Payer: Self-pay

## 2020-08-17 ENCOUNTER — Telehealth: Payer: Self-pay

## 2020-08-17 ENCOUNTER — Inpatient Hospital Stay: Payer: 59

## 2020-08-17 ENCOUNTER — Telehealth (INDEPENDENT_AMBULATORY_CARE_PROVIDER_SITE_OTHER): Payer: Self-pay

## 2020-08-17 VITALS — BP 113/55 | HR 90 | Temp 99.6°F | Resp 20

## 2020-08-17 DIAGNOSIS — C9310 Chronic myelomonocytic leukemia not having achieved remission: Secondary | ICD-10-CM

## 2020-08-17 DIAGNOSIS — Z5112 Encounter for antineoplastic immunotherapy: Secondary | ICD-10-CM | POA: Diagnosis not present

## 2020-08-17 DIAGNOSIS — Z7189 Other specified counseling: Secondary | ICD-10-CM

## 2020-08-17 DIAGNOSIS — D693 Immune thrombocytopenic purpura: Secondary | ICD-10-CM

## 2020-08-17 MED ORDER — ROMIPLOSTIM INJECTION 500 MCG
920.0000 ug | Freq: Once | SUBCUTANEOUS | Status: AC
  Administered 2020-08-17: 920 ug via SUBCUTANEOUS
  Filled 2020-08-17: qty 1.84

## 2020-08-17 MED ORDER — ONDANSETRON HCL 8 MG PO TABS
ORAL_TABLET | ORAL | Status: AC
  Filled 2020-08-17: qty 1

## 2020-08-17 MED ORDER — AZACITIDINE CHEMO SQ INJECTION
100.0000 mg | Freq: Once | INTRAMUSCULAR | Status: AC
  Administered 2020-08-17: 100 mg via SUBCUTANEOUS
  Filled 2020-08-17: qty 4

## 2020-08-17 MED ORDER — ONDANSETRON HCL 8 MG PO TABS
8.0000 mg | ORAL_TABLET | Freq: Once | ORAL | Status: AC
  Administered 2020-08-17: 8 mg via ORAL

## 2020-08-17 NOTE — Telephone Encounter (Signed)
Your fax did not come through complete, can you fax it over again please.

## 2020-08-17 NOTE — Progress Notes (Signed)
Per nursing note on 08/16/20 okay to treat this cycle with labs from 08/16/20 per Dr. Irene Limbo

## 2020-08-17 NOTE — Telephone Encounter (Signed)
Referral received for home health services.  Call placed to [facility], spoke to [facility] who stated that the patient is currently receiving nursing and PT services.  Call placed to patient and she confirmed that she is receiving home health services. She is interested in [facility]. Informed her that a referral can be placed for [facility] if Ms Oletta Lamas, NP is in agreement.

## 2020-08-17 NOTE — Patient Instructions (Signed)
[facility]  Discharge Instructions: Thank you for choosing [facility] to provide your oncology and hematology care.   If you have a lab appointment with the Cancer Center, please go directly to the Cancer Center and check in at the registration area.   Wear comfortable clothing and clothing appropriate for easy access to any Portacath or PICC line.   We strive to give you quality time with your provider. You may need to reschedule your appointment if you arrive late (15 or more minutes).  Arriving late affects you and other patients whose appointments are after yours.  Also, if you miss three or more appointments without notifying the office, you may be dismissed from the clinic at the provider's discretion.      For prescription refill requests, have your pharmacy contact our office and allow 72 hours for refills to be completed.   Today you received the following chemotherapy and/or immunotherapy agents Vidaza     To help prevent nausea and vomiting after your treatment, we encourage you to take your nausea medication as directed.  BELOW ARE SYMPTOMS THAT SHOULD BE REPORTED IMMEDIATELY: *FEVER GREATER THAN 100.4 F (38 C) OR HIGHER *CHILLS OR SWEATING *NAUSEA AND VOMITING THAT IS NOT CONTROLLED WITH YOUR NAUSEA MEDICATION *UNUSUAL SHORTNESS OF BREATH *UNUSUAL BRUISING OR BLEEDING *URINARY PROBLEMS (pain or burning when urinating, or frequent urination) *BOWEL PROBLEMS (unusual diarrhea, constipation, pain near the anus) TENDERNESS IN MOUTH AND THROAT WITH OR WITHOUT PRESENCE OF ULCERS (sore throat, sores in mouth, or a toothache) UNUSUAL RASH, SWELLING OR PAIN  UNUSUAL VAGINAL DISCHARGE OR ITCHING   Items with * indicate a potential emergency and should be followed up as soon as possible or go to the Emergency Department if any problems should occur.  Please show the CHEMOTHERAPY ALERT CARD or IMMUNOTHERAPY ALERT CARD at check-in to the  Emergency Department and triage nurse.  Should you have questions after your visit or need to cancel or reschedule your appointment, please contact [facility]  Dept: [phone]  and follow the prompts.  Office hours are 8:00 a.m. to 4:30 p.m. Monday - Friday. Please note that voicemails left after 4:00 p.m. may not be returned until the following business day.  We are closed weekends and major holidays. You have access to a nurse at all times for urgent questions. Please call the main number to the clinic Dept: [phone] and follow the prompts.   For any non-urgent questions, you may also contact your provider using MyChart. We now offer e-Visits for anyone 18 and older to request care online for non-urgent symptoms. For details visit [email or web address].   Also download the MyChart app! Go to the app store, search "MyChart", open the app, select , and log in with your MyChart username and password.  Due to Covid, a mask is required upon entering the hospital/clinic. If you do not have a mask, one will be given to you upon arrival. For doctor visits, patients may have 1 support person aged 18 or older with them. For treatment visits, patients cannot have anyone with them due to current Covid guidelines and our immunocompromised population.   

## 2020-08-17 NOTE — Telephone Encounter (Signed)
Forms were given to the PCP to review and treat. Nat Christen, CMA   Copied from [facility] [id number]. Topic: General - Other >> Aug 16, 2020  2:40 PM Loma [place] wrote: Reason for CRM: [facility] is faxing over urine results on pt as thinking she may have UTI, be advised.

## 2020-08-17 NOTE — Telephone Encounter (Signed)
Elizabeth with [facility] reporting urinalysis results. Positive for the following - occult blood,protein, nitrite,WBC,RBC,leukocytes and moderate bacteria. Will fax results to practice. Mallory in the practice notified.

## 2020-08-18 ENCOUNTER — Inpatient Hospital Stay: Payer: 59

## 2020-08-18 VITALS — BP 109/61 | HR 89 | Temp 98.5°F | Resp 22

## 2020-08-18 DIAGNOSIS — Z7189 Other specified counseling: Secondary | ICD-10-CM

## 2020-08-18 DIAGNOSIS — C9310 Chronic myelomonocytic leukemia not having achieved remission: Secondary | ICD-10-CM

## 2020-08-18 DIAGNOSIS — Z5112 Encounter for antineoplastic immunotherapy: Secondary | ICD-10-CM | POA: Diagnosis not present

## 2020-08-18 MED ORDER — ONDANSETRON HCL 8 MG PO TABS
8.0000 mg | ORAL_TABLET | Freq: Once | ORAL | Status: AC
  Administered 2020-08-18: 8 mg via ORAL

## 2020-08-18 MED ORDER — ONDANSETRON HCL 8 MG PO TABS
ORAL_TABLET | ORAL | Status: AC
  Filled 2020-08-18: qty 1

## 2020-08-18 MED ORDER — AZACITIDINE CHEMO SQ INJECTION
100.0000 mg | Freq: Once | INTRAMUSCULAR | Status: AC
  Administered 2020-08-18: 100 mg via SUBCUTANEOUS
  Filled 2020-08-18: qty 4

## 2020-08-18 NOTE — Telephone Encounter (Signed)
Kathy Irwin, from [facility], calling stating that she has sent a fax over with this info and is requesting to have a call back with PCP plan of care. Please advise.     [place]

## 2020-08-18 NOTE — Patient Instructions (Signed)
[facility]  Discharge Instructions: Thank you for choosing [facility] to provide your oncology and hematology care.   If you have a lab appointment with the [facility], please go directly to the [facility] and check in at the registration area.   Wear comfortable clothing and clothing appropriate for easy access to any Portacath or PICC line.   We strive to give you quality time with your provider. You may need to reschedule your appointment if you arrive late (15 or more minutes).  Arriving late affects you and other patients whose appointments are after yours.  Also, if you miss three or more appointments without notifying the office, you may be dismissed from the clinic at the provider's discretion.      For prescription refill requests, have your pharmacy contact our office and allow 72 hours for refills to be completed.    Today you received the following chemotherapy and/or immunotherapy agents vidayza   To help prevent nausea and vomiting after your treatment, we encourage you to take your nausea medication as directed.  BELOW ARE SYMPTOMS THAT SHOULD BE REPORTED IMMEDIATELY: . *FEVER GREATER THAN 100.4 F (38 C) OR HIGHER . *CHILLS OR SWEATING . *NAUSEA AND VOMITING THAT IS NOT CONTROLLED WITH YOUR NAUSEA MEDICATION . *UNUSUAL SHORTNESS OF BREATH . *UNUSUAL BRUISING OR BLEEDING . *URINARY PROBLEMS (pain or burning when urinating, or frequent urination) . *BOWEL PROBLEMS (unusual diarrhea, constipation, pain near the anus) . TENDERNESS IN MOUTH AND THROAT WITH OR WITHOUT PRESENCE OF ULCERS (sore throat, sores in mouth, or a toothache) . UNUSUAL RASH, SWELLING OR PAIN  . UNUSUAL VAGINAL DISCHARGE OR ITCHING   Items with * indicate a potential emergency and should be followed up as soon as possible or go to the Emergency Department if any problems should occur.  Please show the CHEMOTHERAPY ALERT CARD or IMMUNOTHERAPY ALERT CARD  at check-in to the Emergency Department and triage nurse.  Should you have questions after your visit or need to cancel or reschedule your appointment, please contact [facility]  Dept: [phone]  and follow the prompts.  Office hours are 8:00 a.m. to 4:30 p.m. Monday - Friday. Please note that voicemails left after 4:00 p.m. may not be returned until the following business day.  We are closed weekends and major holidays. You have access to a nurse at all times for urgent questions. Please call the main number to the clinic Dept: [phone] and follow the prompts.   For any non-urgent questions, you may also contact your provider using MyChart. We now offer e-Visits for anyone 70 and older to request care online for non-urgent symptoms. For details visit [email or web address].   Also download the MyChart app! Go to the app store, search "MyChart", open the app, select [facility], and log in with your MyChart username and password.  Due to Covid, a mask is required upon entering the hospital/clinic. If you do not have a mask, one will be given to you upon arrival. For doctor visits, patients may have 1 support person aged 5 or older with them. For treatment visits, patients cannot have anyone with them due to current Covid guidelines and our immunocompromised population.

## 2020-08-18 NOTE — Telephone Encounter (Signed)
Waiting for C/S- discussed with nephrologist needs referral to urology

## 2020-08-19 ENCOUNTER — Inpatient Hospital Stay: Payer: 59

## 2020-08-19 ENCOUNTER — Ambulatory Visit: Payer: 59

## 2020-08-19 ENCOUNTER — Telehealth (INDEPENDENT_AMBULATORY_CARE_PROVIDER_SITE_OTHER): Payer: Self-pay | Admitting: Primary Care

## 2020-08-19 ENCOUNTER — Other Ambulatory Visit (INDEPENDENT_AMBULATORY_CARE_PROVIDER_SITE_OTHER): Payer: Self-pay | Admitting: Primary Care

## 2020-08-19 ENCOUNTER — Other Ambulatory Visit: Payer: Self-pay

## 2020-08-19 VITALS — BP 134/67 | HR 86 | Temp 98.6°F | Resp 20

## 2020-08-19 DIAGNOSIS — R319 Hematuria, unspecified: Secondary | ICD-10-CM

## 2020-08-19 DIAGNOSIS — Z5112 Encounter for antineoplastic immunotherapy: Secondary | ICD-10-CM | POA: Diagnosis not present

## 2020-08-19 DIAGNOSIS — C9310 Chronic myelomonocytic leukemia not having achieved remission: Secondary | ICD-10-CM

## 2020-08-19 DIAGNOSIS — R82998 Other abnormal findings in urine: Secondary | ICD-10-CM

## 2020-08-19 DIAGNOSIS — D649 Anemia, unspecified: Secondary | ICD-10-CM

## 2020-08-19 DIAGNOSIS — Z7189 Other specified counseling: Secondary | ICD-10-CM

## 2020-08-19 LAB — CBC WITH DIFFERENTIAL (CANCER CENTER ONLY)
Abs Immature Granulocytes: 1.01 10*3/uL — ABNORMAL HIGH (ref 0.00–0.07)
Basophils Absolute: 0.1 10*3/uL (ref 0.0–0.1)
Basophils Relative: 0 %
Eosinophils Absolute: 0 10*3/uL (ref 0.0–0.5)
Eosinophils Relative: 0 %
HCT: 25.8 % — ABNORMAL LOW (ref 36.0–46.0)
Hemoglobin: 7.9 g/dL — ABNORMAL LOW (ref 12.0–15.0)
Immature Granulocytes: 7 %
Lymphocytes Relative: 23 %
Lymphs Abs: 3.2 10*3/uL (ref 0.7–4.0)
MCH: 30.4 pg (ref 26.0–34.0)
MCHC: 30.6 g/dL (ref 30.0–36.0)
MCV: 99.2 fL (ref 80.0–100.0)
Monocytes Absolute: 4.2 10*3/uL — ABNORMAL HIGH (ref 0.1–1.0)
Monocytes Relative: 31 %
Neutro Abs: 5.2 10*3/uL (ref 1.7–7.7)
Neutrophils Relative %: 39 %
Platelet Count: 18 10*3/uL — ABNORMAL LOW (ref 150–400)
RBC: 2.6 MIL/uL — ABNORMAL LOW (ref 3.87–5.11)
RDW: 24.7 % — ABNORMAL HIGH (ref 11.5–15.5)
WBC Count: 13.8 10*3/uL — ABNORMAL HIGH (ref 4.0–10.5)
nRBC: 22.5 % — ABNORMAL HIGH (ref 0.0–0.2)

## 2020-08-19 LAB — CMP (CANCER CENTER ONLY)
ALT: 7 U/L (ref 0–44)
AST: 21 U/L (ref 15–41)
Albumin: 2.8 g/dL — ABNORMAL LOW (ref 3.5–5.0)
Alkaline Phosphatase: 135 U/L — ABNORMAL HIGH (ref 38–126)
Anion gap: 5 (ref 5–15)
BUN: 22 mg/dL (ref 8–23)
CO2: 24 mmol/L (ref 22–32)
Calcium: 9.3 mg/dL (ref 8.9–10.3)
Chloride: 108 mmol/L (ref 98–111)
Creatinine: 0.82 mg/dL (ref 0.44–1.00)
GFR, Estimated: >60 mL/min (ref >60)
Glucose, Bld: 185 mg/dL — ABNORMAL HIGH (ref 70–99)
Potassium: 4.9 mmol/L (ref 3.5–5.1)
Sodium: 137 mmol/L (ref 135–145)
Total Bilirubin: 1.4 mg/dL — ABNORMAL HIGH (ref 0.3–1.2)
Total Protein: 6.4 g/dL — ABNORMAL LOW (ref 6.5–8.1)

## 2020-08-19 LAB — PREPARE RBC (CROSSMATCH)

## 2020-08-19 MED ORDER — METHYLPREDNISOLONE SODIUM SUCC 40 MG IJ SOLR
40.0000 mg | Freq: Once | INTRAMUSCULAR | Status: AC
  Administered 2020-08-19: 40 mg via INTRAVENOUS

## 2020-08-19 MED ORDER — METHYLPREDNISOLONE SODIUM SUCC 40 MG IJ SOLR
INTRAMUSCULAR | Status: AC
  Filled 2020-08-19: qty 1

## 2020-08-19 MED ORDER — AZACITIDINE CHEMO SQ INJECTION
100.0000 mg | Freq: Once | INTRAMUSCULAR | Status: AC
  Administered 2020-08-19: 100 mg via SUBCUTANEOUS
  Filled 2020-08-19: qty 4

## 2020-08-19 MED ORDER — DIPHENHYDRAMINE HCL 25 MG PO CAPS
25.0000 mg | ORAL_CAPSULE | Freq: Once | ORAL | Status: AC
  Administered 2020-08-19: 25 mg via ORAL

## 2020-08-19 MED ORDER — ONDANSETRON HCL 8 MG PO TABS
8.0000 mg | ORAL_TABLET | Freq: Once | ORAL | Status: AC
  Administered 2020-08-19: 8 mg via ORAL

## 2020-08-19 MED ORDER — DIPHENHYDRAMINE HCL 25 MG PO CAPS
ORAL_CAPSULE | ORAL | Status: AC
  Filled 2020-08-19: qty 1

## 2020-08-19 MED ORDER — ACETAMINOPHEN 325 MG PO TABS
650.0000 mg | ORAL_TABLET | Freq: Once | ORAL | Status: AC
Start: 2020-08-19 — End: 2020-08-19
  Administered 2020-08-19: 650 mg via ORAL

## 2020-08-19 MED ORDER — SODIUM CHLORIDE 0.9% IV SOLUTION
250.0000 mL | Freq: Once | INTRAVENOUS | Status: AC
  Administered 2020-08-19: 250 mL via INTRAVENOUS
  Filled 2020-08-19: qty 250

## 2020-08-19 MED ORDER — ACETAMINOPHEN 325 MG PO TABS
ORAL_TABLET | ORAL | Status: AC
  Filled 2020-08-19: qty 2

## 2020-08-19 MED ORDER — METHYLPREDNISOLONE SODIUM SUCC 125 MG IJ SOLR
INTRAMUSCULAR | Status: AC
  Filled 2020-08-19: qty 2

## 2020-08-19 MED ORDER — ONDANSETRON HCL 8 MG PO TABS
ORAL_TABLET | ORAL | Status: AC
  Filled 2020-08-19: qty 1

## 2020-08-19 NOTE — Telephone Encounter (Signed)
Please advise.

## 2020-08-19 NOTE — Patient Instructions (Addendum)
[facility]  Discharge Instructions: Thank you for choosing [facility] to provide your oncology and hematology care.   If you have a lab appointment with the [facility], please go directly to the [facility] and check in at the registration area.   Wear comfortable clothing and clothing appropriate for easy access to any Portacath or PICC line.   We strive to give you quality time with your provider. You may need to reschedule your appointment if you arrive late (15 or more minutes).  Arriving late affects you and other patients whose appointments are after yours.  Also, if you miss three or more appointments without notifying the office, you may be dismissed from the clinic at the provider's discretion.      For prescription refill requests, have your pharmacy contact our office and allow 72 hours for refills to be completed.    Today you received the following chemotherapy and/or immunotherapy agents Vidaza   To help prevent nausea and vomiting after your treatment, we encourage you to take your nausea medication as directed.  BELOW ARE SYMPTOMS THAT SHOULD BE REPORTED IMMEDIATELY: . *FEVER GREATER THAN 100.4 F (38 C) OR HIGHER . *CHILLS OR SWEATING . *NAUSEA AND VOMITING THAT IS NOT CONTROLLED WITH YOUR NAUSEA MEDICATION . *UNUSUAL SHORTNESS OF BREATH . *UNUSUAL BRUISING OR BLEEDING . *URINARY PROBLEMS (pain or burning when urinating, or frequent urination) . *BOWEL PROBLEMS (unusual diarrhea, constipation, pain near the anus) . TENDERNESS IN MOUTH AND THROAT WITH OR WITHOUT PRESENCE OF ULCERS (sore throat, sores in mouth, or a toothache) . UNUSUAL RASH, SWELLING OR PAIN  . UNUSUAL VAGINAL DISCHARGE OR ITCHING   Items with * indicate a potential emergency and should be followed up as soon as possible or go to the Emergency Department if any problems should occur.  Please show the CHEMOTHERAPY ALERT CARD or IMMUNOTHERAPY ALERT CARD at  check-in to the Emergency Department and triage nurse.  Should you have questions after your visit or need to cancel or reschedule your appointment, please contact [facility]  Dept: [phone]  and follow the prompts.  Office hours are 8:00 a.m. to 4:30 p.m. Monday - Friday. Please note that voicemails left after 4:00 p.m. may not be returned until the following business day.  We are closed weekends and major holidays. You have access to a nurse at all times for urgent questions. Please call the main number to the clinic Dept: [phone] and follow the prompts.   For any non-urgent questions, you may also contact your provider using MyChart. We now offer e-Visits for anyone 34 and older to request care online for non-urgent symptoms. For details visit [email or web address].   Also download the MyChart app! Go to the app store, search "MyChart", open the app, select [facility], and log in with your MyChart username and password.  Due to Covid, a mask is required upon entering the hospital/clinic. If you do not have a mask, one will be given to you upon arrival. For doctor visits, patients may have 1 support person aged 59 or older with them. For treatment visits, patients cannot have anyone with them due to current Covid guidelines and our immunocompromised population.     Blood Transfusion, Adult A blood transfusion is a procedure in which you receive blood or a type of blood cell (blood component) through an IV. You may need a blood transfusion when your blood level is low. This may result  from a bleeding disorder, illness, injury, or surgery. The blood may come from a donor. You may also be able to donate blood for yourself (autologous blood donation) before a planned surgery. The blood given in a transfusion is made up of different blood components. You may receive:  Red blood cells. These carry oxygen to the cells in the body.  Platelets. These help your  blood to clot.  Plasma. This is the liquid part of your blood. It carries proteins and other substances throughout the body.  White blood cells. These help you fight infections. If you have hemophilia or another clotting disorder, you may also receive other types of blood products. Tell a health care provider about:  Any blood disorders you have.  Any previous reactions you have had during a blood transfusion.  Any allergies you have.  All medicines you are taking, including vitamins, herbs, eye drops, creams, and over-the-counter medicines.  Any surgeries you have had.  Any medical conditions you have, including any recent fever or cold symptoms.  Whether you are pregnant or may be pregnant. What are the risks? Generally, this is a safe procedure. However, problems may occur.  The most common problems include: ? A mild allergic reaction, such as red, swollen areas of skin (hives) and itching. ? Fever or chills. This may be the body's response to new blood cells received. This may occur during or up to 4 hours after the transfusion.  More serious problems may include: ? Transfusion-associated circulatory overload (TACO), or too much fluid in the lungs. This may cause breathing problems. ? A serious allergic reaction, such as difficulty breathing or swelling around the face and lips. ? Transfusion-related acute lung injury (TRALI), which causes breathing difficulty and low oxygen in the blood. This can occur within hours of the transfusion or several days later. ? Iron overload. This can happen after receiving many blood transfusions over a period of time. ? Infection or virus being transmitted. This is rare because donated blood is carefully tested before it is given. ? Hemolytic transfusion reaction. This is rare. It happens when your body's defense system (immune system)tries to attack the new blood cells. Symptoms may include fever, chills, nausea, low blood pressure, and low  back or chest pain. ? Transfusion-associated graft-versus-host disease (TAGVHD). This is rare. It happens when donated cells attack your body's healthy tissues. What happens before the procedure? Medicines Ask your health care provider about:  Changing or stopping your regular medicines. This is especially important if you are taking diabetes medicines or blood thinners.  Taking medicines such as aspirin and ibuprofen. These medicines can thin your blood. Do not take these medicines unless your health care provider tells you to take them.  Taking over-the-counter medicines, vitamins, herbs, and supplements. General instructions  Follow instructions from your health care provider about eating and drinking restrictions.  You will have a blood test to determine your blood type. This is necessary to know what kind of blood your body will accept and to match it to the donor blood.  If you are going to have a planned surgery, you may be able to do an autologous blood donation. This may be done in case you need to have a transfusion.  You will have your temperature, blood pressure, and pulse monitored before the transfusion.  If you have had an allergic reaction to a transfusion in the past, you may be given medicine to help prevent a reaction. This medicine may be given to you  by mouth (orally) or through an IV.  Set aside time for the blood transfusion. This procedure generally takes 1-4 hours to complete. What happens during the procedure?  An IV will be inserted into one of your veins.  The bag of donated blood will be attached to your IV. The blood will then enter through your vein.  Your temperature, blood pressure, and pulse will be monitored regularly during the transfusion. This monitoring is done to detect early signs of a transfusion reaction.  Tell your nurse right away if you have any of these symptoms during the transfusion: ? Shortness of breath or trouble breathing. ? Chest  or back pain. ? Fever or chills. ? Hives or itching.  If you have any signs or symptoms of a reaction, your transfusion will be stopped and you may be given medicine.  When the transfusion is complete, your IV will be removed.  Pressure may be applied to the IV site for a few minutes.  A bandage (dressing)will be applied. The procedure may vary among health care providers and hospitals.   What happens after the procedure?  Your temperature, blood pressure, pulse, breathing rate, and blood oxygen level will be monitored until you leave the hospital or clinic.  Your blood may be tested to see how you are responding to the transfusion.  You may be warmed with fluids or blankets to maintain a normal body temperature.  If you receive your blood transfusion in an outpatient setting, you will be told whom to contact to report any reactions. Where to find more information For more information on blood transfusions, visit the American Red Cross: redcross.org Summary  A blood transfusion is a procedure in which you receive blood or a type of blood cell (blood component) through an IV.  The blood you receive may come from a donor or be donated by yourself (autologous blood donation) before a planned surgery.  The blood given in a transfusion is made up of different blood components. You may receive red blood cells, platelets, plasma, or white blood cells depending on the condition treated.  Your temperature, blood pressure, and pulse will be monitored before, during, and after the transfusion.  After the transfusion, your blood may be tested to see how your body has responded. This information is not intended to replace advice given to you by your health care provider. Make sure you discuss any questions you have with your health care provider. Document Revised: 02/06/2019 Document Reviewed: 09/26/2018 Elsevier Patient Education  [place].

## 2020-08-19 NOTE — Progress Notes (Unsigned)
08/19/20: Hgb 7.9, Plts: 18 per Dr Irene Limbo ok for pt to get only 1 unit of blood today.

## 2020-08-19 NOTE — Telephone Encounter (Signed)
Heather from [facility] called and stated the pt refused the urine sample and nurse visits until Friday due to having Dr appts and going to the cancer center/ heather wants to make sure doing it on Friday is ok/ please advise

## 2020-08-20 ENCOUNTER — Encounter: Payer: Self-pay | Admitting: Primary Care

## 2020-08-20 ENCOUNTER — Inpatient Hospital Stay: Payer: 59

## 2020-08-20 VITALS — BP 114/61 | Temp 98.4°F | Resp 20 | Wt 224.6 lb

## 2020-08-20 DIAGNOSIS — C9310 Chronic myelomonocytic leukemia not having achieved remission: Secondary | ICD-10-CM

## 2020-08-20 DIAGNOSIS — Z7189 Other specified counseling: Secondary | ICD-10-CM

## 2020-08-20 DIAGNOSIS — Z5112 Encounter for antineoplastic immunotherapy: Secondary | ICD-10-CM | POA: Diagnosis not present

## 2020-08-20 LAB — BPAM RBC
Blood Product Expiration Date: 202205212359
ISSUE DATE / TIME: 202205051110
Unit Type and Rh: 6200

## 2020-08-20 LAB — TYPE AND SCREEN
ABO/RH(D): AB POS
Antibody Screen: NEGATIVE
Unit division: 0

## 2020-08-20 MED ORDER — ONDANSETRON HCL 8 MG PO TABS
8.0000 mg | ORAL_TABLET | Freq: Once | ORAL | Status: AC
Start: 2020-08-20 — End: 2020-08-20
  Administered 2020-08-20: 8 mg via ORAL

## 2020-08-20 MED ORDER — AZACITIDINE CHEMO SQ INJECTION
100.0000 mg | Freq: Once | INTRAMUSCULAR | Status: AC
  Administered 2020-08-20: 100 mg via SUBCUTANEOUS
  Filled 2020-08-20: qty 4

## 2020-08-20 MED ORDER — ONDANSETRON HCL 8 MG PO TABS
ORAL_TABLET | ORAL | Status: AC
  Filled 2020-08-20: qty 1

## 2020-08-20 NOTE — Patient Instructions (Signed)
[facility]  Discharge Instructions: Thank you for choosing [facility] to provide your oncology and hematology care.   If you have a lab appointment with the [facility], please go directly to the [facility] and check in at the registration area.   Wear comfortable clothing and clothing appropriate for easy access to any Portacath or PICC line.   We strive to give you quality time with your provider. You may need to reschedule your appointment if you arrive late (15 or more minutes).  Arriving late affects you and other patients whose appointments are after yours.  Also, if you miss three or more appointments without notifying the office, you may be dismissed from the clinic at the provider's discretion.      For prescription refill requests, have your pharmacy contact our office and allow 72 hours for refills to be completed.    Today you received the following chemotherapy and/or immunotherapy agent: Azacitidine (Vidaza)      To help prevent nausea and vomiting after your treatment, we encourage you to take your nausea medication as directed.  BELOW ARE SYMPTOMS THAT SHOULD BE REPORTED IMMEDIATELY: . *FEVER GREATER THAN 100.4 F (38 C) OR HIGHER . *CHILLS OR SWEATING . *NAUSEA AND VOMITING THAT IS NOT CONTROLLED WITH YOUR NAUSEA MEDICATION . *UNUSUAL SHORTNESS OF BREATH . *UNUSUAL BRUISING OR BLEEDING . *URINARY PROBLEMS (pain or burning when urinating, or frequent urination) . *BOWEL PROBLEMS (unusual diarrhea, constipation, pain near the anus) . TENDERNESS IN MOUTH AND THROAT WITH OR WITHOUT PRESENCE OF ULCERS (sore throat, sores in mouth, or a toothache) . UNUSUAL RASH, SWELLING OR PAIN  . UNUSUAL VAGINAL DISCHARGE OR ITCHING   Items with * indicate a potential emergency and should be followed up as soon as possible or go to the Emergency Department if any problems should occur.  Please show the CHEMOTHERAPY ALERT CARD or  IMMUNOTHERAPY ALERT CARD at check-in to the Emergency Department and triage nurse.  Should you have questions after your visit or need to cancel or reschedule your appointment, please contact [facility]  Dept: [phone]  and follow the prompts.  Office hours are 8:00 a.m. to 4:30 p.m. Monday - Friday. Please note that voicemails left after 4:00 p.m. may not be returned until the following business day.  We are closed weekends and major holidays. You have access to a nurse at all times for urgent questions. Please call the main number to the clinic Dept: [phone] and follow the prompts.   For any non-urgent questions, you may also contact your provider using MyChart. We now offer e-Visits for anyone 63 and older to request care online for non-urgent symptoms. For details visit [email or web address].   Also download the MyChart app! Go to the app store, search "MyChart", open the app, select [facility], and log in with your MyChart username and password.  Due to Covid, a mask is required upon entering the hospital/clinic. If you do not have a mask, one will be given to you upon arrival. For doctor visits, patients may have 1 support person aged 62 or older with them. For treatment visits, patients cannot have anyone with them due to current Covid guidelines and our immunocompromised population.

## 2020-08-20 NOTE — Progress Notes (Unsigned)
Per Dr Irene Limbo pt ok for Vidaza tx today.

## 2020-08-23 ENCOUNTER — Telehealth: Payer: Self-pay | Admitting: Primary Care

## 2020-08-23 ENCOUNTER — Other Ambulatory Visit (INDEPENDENT_AMBULATORY_CARE_PROVIDER_SITE_OTHER): Payer: Self-pay | Admitting: Primary Care

## 2020-08-23 ENCOUNTER — Telehealth (INDEPENDENT_AMBULATORY_CARE_PROVIDER_SITE_OTHER): Payer: Self-pay | Admitting: Primary Care

## 2020-08-23 ENCOUNTER — Ambulatory Visit (INDEPENDENT_AMBULATORY_CARE_PROVIDER_SITE_OTHER): Payer: 59 | Admitting: Primary Care

## 2020-08-23 NOTE — Telephone Encounter (Signed)
Copied from [facility] [id number]. Topic: General - Inquiry >> Aug 18, 2020  2:41 PM Loma [place] wrote: [facility], Ander Purpura has called back for Sharyn Lull to make sure it is ok if  they send nurse out tomorrow for urine collection as pt is at cancer center getting an infusion. Verify ok  at [phone]

## 2020-08-23 NOTE — Telephone Encounter (Signed)
An order was faxed to office to obtain urine and culture and sensitivity. The order has been faxed back to [facility].

## 2020-08-23 NOTE — Telephone Encounter (Signed)
Both requested refills are not active orders. Will need to be ordered.

## 2020-08-23 NOTE — Telephone Encounter (Signed)
Kathy Irwin from [facility] [employer] called and stated that pt is out her weight parameters and she was wondering if the paramaters can be changed / pts weight is lower than the set parameters / her weight is usually 188lbs-192lbs / please advise

## 2020-08-23 NOTE — Telephone Encounter (Signed)
Called to inform the doctor that patient had an abnormal urine sample.  Would like a call back with a treatment plan.  Call Lauren at [phone]

## 2020-08-23 NOTE — Telephone Encounter (Signed)
Called and left message asking nurse to call back. Patient provided urine and culture was done one week ago. Seeking clarity on request.

## 2020-08-24 ENCOUNTER — Other Ambulatory Visit: Payer: Self-pay

## 2020-08-24 ENCOUNTER — Ambulatory Visit: Payer: 59

## 2020-08-24 ENCOUNTER — Encounter (HOSPITAL_COMMUNITY): Payer: Self-pay | Admitting: Cardiology

## 2020-08-24 ENCOUNTER — Ambulatory Visit (HOSPITAL_COMMUNITY)
Admit: 2020-08-24 | Discharge: 2020-08-24 | Disposition: A | Discharge status: Home / Self Care | Payer: 59 | Source: Ambulatory Visit | Attending: Cardiology | Admitting: Cardiology

## 2020-08-24 ENCOUNTER — Inpatient Hospital Stay: Payer: 59

## 2020-08-24 VITALS — BP 103/47 | HR 95 | Temp 98.9°F | Resp 18

## 2020-08-24 VITALS — BP 150/80 | HR 108 | Wt 224.2 lb

## 2020-08-24 DIAGNOSIS — Z7951 Long term (current) use of inhaled steroids: Secondary | ICD-10-CM | POA: Diagnosis not present

## 2020-08-24 DIAGNOSIS — I5031 Acute diastolic (congestive) heart failure: Secondary | ICD-10-CM

## 2020-08-24 DIAGNOSIS — R0602 Shortness of breath: Secondary | ICD-10-CM | POA: Diagnosis not present

## 2020-08-24 DIAGNOSIS — J849 Interstitial pulmonary disease, unspecified: Secondary | ICD-10-CM | POA: Diagnosis not present

## 2020-08-24 DIAGNOSIS — Z79899 Other long term (current) drug therapy: Secondary | ICD-10-CM | POA: Diagnosis not present

## 2020-08-24 DIAGNOSIS — I5032 Chronic diastolic (congestive) heart failure: Secondary | ICD-10-CM | POA: Diagnosis not present

## 2020-08-24 DIAGNOSIS — C9310 Chronic myelomonocytic leukemia not having achieved remission: Secondary | ICD-10-CM

## 2020-08-24 DIAGNOSIS — I272 Pulmonary hypertension, unspecified: Secondary | ICD-10-CM | POA: Diagnosis not present

## 2020-08-24 DIAGNOSIS — Z794 Long term (current) use of insulin: Secondary | ICD-10-CM | POA: Diagnosis not present

## 2020-08-24 DIAGNOSIS — Z8616 Personal history of COVID-19: Secondary | ICD-10-CM | POA: Insufficient documentation

## 2020-08-24 DIAGNOSIS — I110 Hypertensive heart disease with heart failure: Secondary | ICD-10-CM | POA: Insufficient documentation

## 2020-08-24 DIAGNOSIS — D693 Immune thrombocytopenic purpura: Secondary | ICD-10-CM | POA: Diagnosis not present

## 2020-08-24 DIAGNOSIS — Z8249 Family history of ischemic heart disease and other diseases of the circulatory system: Secondary | ICD-10-CM | POA: Diagnosis not present

## 2020-08-24 DIAGNOSIS — R0600 Dyspnea, unspecified: Secondary | ICD-10-CM | POA: Insufficient documentation

## 2020-08-24 DIAGNOSIS — Z5112 Encounter for antineoplastic immunotherapy: Secondary | ICD-10-CM | POA: Diagnosis not present

## 2020-08-24 HISTORY — DX: Heart failure, unspecified: I50.9

## 2020-08-24 LAB — CBC WITH DIFFERENTIAL (CANCER CENTER ONLY)
Abs Immature Granulocytes: 0.74 10*3/uL — ABNORMAL HIGH (ref 0.00–0.07)
Basophils Absolute: 0.1 10*3/uL (ref 0.0–0.1)
Basophils Relative: 1 %
Eosinophils Absolute: 0 10*3/uL (ref 0.0–0.5)
Eosinophils Relative: 0 %
HCT: 24.8 % — ABNORMAL LOW (ref 36.0–46.0)
Hemoglobin: 7.8 g/dL — ABNORMAL LOW (ref 12.0–15.0)
Immature Granulocytes: 7 %
Lymphocytes Relative: 34 %
Lymphs Abs: 3.9 10*3/uL (ref 0.7–4.0)
MCH: 30.4 pg (ref 26.0–34.0)
MCHC: 31.5 g/dL (ref 30.0–36.0)
MCV: 96.5 fL (ref 80.0–100.0)
Monocytes Absolute: 2.9 10*3/uL — ABNORMAL HIGH (ref 0.1–1.0)
Monocytes Relative: 26 %
Neutro Abs: 3.5 10*3/uL (ref 1.7–7.7)
Neutrophils Relative %: 32 %
Platelet Count: 21 10*3/uL — ABNORMAL LOW (ref 150–400)
RBC: 2.57 MIL/uL — ABNORMAL LOW (ref 3.87–5.11)
RDW: 22.6 % — ABNORMAL HIGH (ref 11.5–15.5)
WBC Count: 11.1 10*3/uL — ABNORMAL HIGH (ref 4.0–10.5)
nRBC: 15.5 % — ABNORMAL HIGH (ref 0.0–0.2)

## 2020-08-24 LAB — CBC
HCT: 27 % — ABNORMAL LOW (ref 36.0–46.0)
Hemoglobin: 8.3 g/dL — ABNORMAL LOW (ref 12.0–15.0)
MCH: 30.7 pg (ref 26.0–34.0)
MCHC: 30.7 g/dL (ref 30.0–36.0)
MCV: 100 fL (ref 80.0–100.0)
Platelets: 20 10*3/uL — CL (ref 150–400)
RBC: 2.7 MIL/uL — ABNORMAL LOW (ref 3.87–5.11)
RDW: 22.6 % — ABNORMAL HIGH (ref 11.5–15.5)
WBC: 9.6 10*3/uL (ref 4.0–10.5)
nRBC: 18.1 % — ABNORMAL HIGH (ref 0.0–0.2)

## 2020-08-24 LAB — BRAIN NATRIURETIC PEPTIDE: B Natriuretic Peptide: 184.5 pg/mL — ABNORMAL HIGH (ref 0.0–100.0)

## 2020-08-24 LAB — PREPARE RBC (CROSSMATCH)

## 2020-08-24 MED ORDER — SPIRONOLACTONE 25 MG PO TABS: 12.5000 mg | ORAL_TABLET | Freq: Every day | ORAL | 3 refills | Status: DC

## 2020-08-24 MED ORDER — METHYLPREDNISOLONE SODIUM SUCC 40 MG IJ SOLR: 40.0000 mg | Freq: Once | INTRAMUSCULAR | Status: DC

## 2020-08-24 MED ORDER — FUROSEMIDE 20 MG PO TABS: 40.0000 mg | ORAL_TABLET | Freq: Every day | ORAL | 3 refills | Status: AC

## 2020-08-24 MED ORDER — SODIUM CHLORIDE 0.9 % IV SOLN
Freq: Once | INTRAVENOUS | Status: AC
Start: 2020-08-24 — End: 2020-08-24
  Filled 2020-08-24: qty 250

## 2020-08-24 NOTE — Progress Notes (Signed)
Per Dr. Irene Limbo, no need for blood transfusion today. 217m normal saline to go over 1 hour per Dr. KIrene Limbo

## 2020-08-24 NOTE — Patient Instructions (Signed)
Rehydration, Adult Rehydration is the replacement of body fluids, salts, and minerals (electrolytes) that are lost during dehydration. Dehydration is when there is not enough water or other fluids in the body. This happens when you lose more fluids than you take in. Common causes of dehydration include:  Not drinking enough fluids. This can occur when you are ill or doing activities that require a lot of energy, especially in hot weather.  Conditions that cause loss of water or other fluids, such as diarrhea, vomiting, sweating, or urinating a lot.  Other illnesses, such as fever or infection.  Certain medicines, such as those that remove excess fluid from the body (diuretics). Symptoms of mild or moderate dehydration may include thirst, dry lips and mouth, and dizziness. Symptoms of severe dehydration may include increased heart rate, confusion, fainting, and not urinating. For severe dehydration, you may need to get fluids through an IV at the hospital. For mild or moderate dehydration, you can usually rehydrate at home by drinking certain fluids as told by your health care provider. What are the risks? Generally, rehydration is safe. However, taking in too much fluid (overhydration) can be a problem. This is rare. Overhydration can cause an electrolyte imbalance, kidney failure, or a decrease in salt (sodium) levels in the body. Supplies needed You will need an oral rehydration solution (ORS) if your health care provider tells you to use one. This is a drink to treat dehydration. It can be found in pharmacies and retail stores. How to rehydrate Fluids Follow instructions from your health care provider for rehydration. The kind of fluid and the amount you should drink depend on your condition. In general, you should choose drinks that you prefer.  If told by your health care provider, drink an ORS. ? Make an ORS by following instructions on the package. ? Start by drinking small amounts,  about  cup (120 mL) every 5-10 minutes. ? Slowly increase how much you drink until you have taken the amount recommended by your health care provider.  Drink enough clear fluids to keep your urine pale yellow. If you were told to drink an ORS, finish it first, then start slowly drinking other clear fluids. Drink fluids such as: ? Water. This includes sparkling water and flavored water. Drinking only water can lead to having too little sodium in your body (hyponatremia). Follow the advice of your health care provider. ? Water from ice chips you suck on. ? Fruit juice with water you add to it (diluted). ? Sports drinks. ? Hot or cold herbal teas. ? Broth-based soups. ? Milk or milk products. Food Follow instructions from your health care provider about what to eat while you rehydrate. Your health care provider may recommend that you slowly begin eating regular foods in small amounts.  Eat foods that contain a healthy balance of electrolytes, such as bananas, oranges, potatoes, tomatoes, and spinach.  Avoid foods that are greasy or contain a lot of sugar. In some cases, you may get nutrition through a feeding tube that is passed through your nose and into your stomach (nasogastric tube, or NG tube). This may be done if you have uncontrolled vomiting or diarrhea.   Beverages to avoid Certain beverages may make dehydration worse. While you rehydrate, avoid drinking alcohol.   How to tell if you are recovering from dehydration You may be recovering from dehydration if:  You are urinating more often than before you started rehydrating.  Your urine is pale yellow.  Your energy level  improves.  You vomit less frequently.  You have diarrhea less frequently.  Your appetite improves or returns to normal.  You feel less dizzy or less light-headed.  Your skin tone and color start to look more normal. Follow these instructions at home:  Take over-the-counter and prescription medicines only  as told by your health care provider.  Do not take sodium tablets. Doing this can lead to having too much sodium in your body (hypernatremia). Contact a health care provider if:  You continue to have symptoms of mild or moderate dehydration, such as: ? Thirst. ? Dry lips. ? Slightly dry mouth. ? Dizziness. ? Dark urine or less urine than normal. ? Muscle cramps.  You continue to vomit or have diarrhea. Get help right away if you:  Have symptoms of dehydration that get worse.  Have a fever.  Have a severe headache.  Have been vomiting and the following happens: ? Your vomiting gets worse or does not go away. ? Your vomit includes blood or green matter (bile). ? You cannot eat or drink without vomiting.  Have problems with urination or bowel movements, such as: ? Diarrhea that gets worse or does not go away. ? Blood in your stool (feces). This may cause stool to look black and tarry. ? Not urinating, or urinating only a small amount of very dark urine, within 6-8 hours.  Have trouble breathing.  Have symptoms that get worse with treatment. These symptoms may represent a serious problem that is an emergency. Do not wait to see if the symptoms will go away. Get medical help right away. Call your local emergency services (911 in the [place] Do not drive yourself to the hospital. Summary  Rehydration is the replacement of body fluids and minerals (electrolytes) that are lost during dehydration.  Follow instructions from your health care provider for rehydration. The kind of fluid and amount you should drink depend on your condition.  Slowly increase how much you drink until you have taken the amount recommended by your health care provider.  Contact your health care provider if you continue to show signs of mild or moderate dehydration. This information is not intended to replace advice given to you by your health care provider. Make sure you discuss any questions you have with  your health care provider. Document Revised: 06/04/2019 Document Reviewed: 04/14/2019 Elsevier Patient Education  2021 [employer].

## 2020-08-24 NOTE — Progress Notes (Signed)
PCP: Kerin Perna, NP Cardiology: Dr. Claiborne Billings HF Cardiology: Dr. Aundra Dubin  70 y.o. with history of CMML, chronic ITP, HTN, type 2 diabetes, and chronic diastolic CHF was referred by pulmonary (Dr. Vaughan Browner) for evaluation of diastolic CHF.  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  She has been on home oxygen for > 10 years.  She is followed by Dr. Irene Limbo for CMML and ITP and is getting chemotherapy with Vidaza.  Platelets have been very low, most recently 18K.  She fell and fractured her right hip in 1/22, s/p surgical repair that was complicated by infection.  She was admitted in 3/22 with thrombocytopenia, anemia, and CHF.   She has had diastolic CHF and has been on Lasix 20 mg daily.  Last echo in 4/21 showed EF 70-75%,  mild RV enlargement with normal systolic function, PASP 51 mmHg, IVC normal. She is very short of breath with exertion chronically.  She gets short of breath walking around the house or walking to the car.  Dyspnea with most ADLs.  She has significant peripheral edema and uses compression stockings.  Dyspnea has steadily worsened.  No lightheadedness or syncope.  No palpitations.   ECG (personally reviewed): NSR, LVH  Labs (5/22): K 4.9, creatinine 0.82, hgb 7.9, plts 18  PMH: 1. COVID-19 8/21 2. Depression 3. 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  5. Chronic ITP 6. HTN 7. Type 2 diabetes 8. Thyroid nodule 9. GERD 10. Interstitial lung disease: Hypersensitivity pneumonitis.  Has seen Dr. Vaughan Browner.  - Home oxygen > 10 years.  11. Sleep study was negative.  12. Right hip fracture in 1/22, treated with intramedullary nail and complicated by surgical site infection.  13. Chronic diastolic CHF: Echo in 0/34 with EF 70-75%, mild RV enlargement with normal systolic function, PASP 51 mmHg, IVC normal.   Social History   Socioeconomic History  . Marital status: Married    Spouse name:  Not on file  . Number of children: Not on file  . Years of education: Not on file  . Highest education level: Not on file  Occupational History  . Not on file  Tobacco Use  . Smoking status: Never Smoker  . Smokeless tobacco: Never Used  Vaping Use  . Vaping Use: Never used  Substance and Sexual Activity  . Alcohol use: Never  . Drug use: Never  . Sexual activity: Not on file  Other Topics Concern  . Not on file  Social History Narrative  . Not on file   Social Determinants of Health   Financial Resource Strain: Not on file  Food Insecurity: Not on file  Transportation Needs: Not on file  Physical Activity: Not on file  Stress: Not on file  Social Connections: Not on file  Intimate Partner Violence: Not on file   Family History  Problem Relation Age of Onset  . Diabetes Other   . Hypertension Other    ROS: All systems reviewed and negative except as per HPI.   Current Outpatient Medications  Medication Sig Dispense Refill  . acetaminophen (TYLENOL) 500 MG tablet Take 1,000 mg by mouth at bedtime.    Marland Kitchen albuterol (VENTOLIN HFA) 108 (90 Base) MCG/ACT inhaler INHALE 1-2 PUFFS BY MOUTH EVERY 6 HOURS AS NEEDED FOR WHEEZE OR SHORTNESS OF BREATH 8.5 each 1  . BD PEN NEEDLE NANO 2ND GEN 32G X 4 MM MISC USE WITH LANTUS'    . blood glucose  meter kit and supplies KIT Dispense based on patient and insurance preference. Use up to four times daily as directed. (FOR ICD-9 250.00, 250.01). For QAC - HS accuchecks. 1 each 1  . blood glucose meter kit and supplies Dispense based on patient and insurance preference. Use up to four times daily as directed. (FOR ICD-10 E10.9, E11.9). 1 each 0  . [employer] TUSSIN DM 200-20 MG/10ML liquid Take 5 mLs by mouth every 4 (four) hours as needed.    . D-5000 125 MCG (5000 UT) TABS Take 1 tablet by mouth daily.    . famotidine (PEPCID) 40 MG tablet Take 1 tablet (40 mg total) by mouth daily.    . fluticasone (FLONASE) 50 MCG/ACT nasal spray PLACE 2 SPRAYS  INTO BOTH NOSTRILS DAILY AS NEEDED FOR ALLERGIES OR RHINITIS. 18.2 mL 1  . Fluticasone-Salmeterol (ADVAIR) 100-50 MCG/DOSE AEPB Inhale 1 puff into the lungs every 12 (twelve) hours. 60 each 1  . HYDROcodone-acetaminophen (NORCO) 10-325 MG tablet Take 1 tablet by mouth every 6 (six) hours as needed. 30 tablet 0  . LANTUS SOLOSTAR 100 UNIT/ML Solostar Pen SMARTSIG:10 Unit(s) SUB-Q Daily    . linaclotide (LINZESS) 145 MCG CAPS capsule Take 1 capsule (145 mcg total) by mouth daily before breakfast.    . metoprolol succinate (TOPROL-XL) 25 MG 24 hr tablet Take 1 tablet (25 mg total) by mouth daily. 90 tablet 1  . metoprolol succinate (TOPROL-XL) 50 MG 24 hr tablet TAKE 1 TABLET BY MOUTH EVERY DAY IN THE MORNING 30 tablet 1  . montelukast (SINGULAIR) 10 MG tablet Take 10 mg by mouth at bedtime.    . Multiple Vitamin (MULTIVITAMIN WITH MINERALS) TABS tablet Take 1 tablet by mouth daily.    Marland Kitchen olopatadine (PATANOL) 0.1 % ophthalmic solution Place 1 drop into both eyes 2 (two) times daily. 5 mL 0  . OXYGEN Inhale 2-3 L into the lungs See admin instructions. Takes 2 L during day time if needed and 3 L at night    . pantoprazole (PROTONIX) 40 MG tablet Take 1 tablet (40 mg total) by mouth daily. 60 tablet 0  . polyethylene glycol (MIRALAX / GLYCOLAX) 17 g packet Take 17 g by mouth daily as needed for mild constipation or moderate constipation.  0  . pravastatin (PRAVACHOL) 40 MG tablet Take 1 tablet (40 mg total) by mouth at bedtime. 90 tablet 1  . pregabalin (LYRICA) 100 MG capsule Take 1 capsule (100 mg total) by mouth at bedtime. 90 capsule 0  . senna-docusate (SENOKOT-S) 8.6-50 MG tablet Take 1 tablet by mouth 2 (two) times daily. 60 tablet 2  . sertraline (ZOLOFT) 100 MG tablet TAKE 1 TABLET BY MOUTH AT BEDTIME 90 tablet 0  . simethicone (MYLICON) 80 MG chewable tablet Chew 1 tablet (80 mg total) by mouth 4 (four) times daily as needed for flatulence.    Marland Kitchen spironolactone (ALDACTONE) 25 MG tablet Take 0.5  tablets (12.5 mg total) by mouth daily. 45 tablet 3  . triamcinolone cream (KENALOG) 0.1 % Apply topically every 6 (six) hours as needed.    Marland Kitchen ACCU-CHEK GUIDE test strip TEST UP TO 4 TIMES A DAY 100 strip 11  . Accu-Chek Softclix Lancets lancets TEST UP TO 4 TIMES A DAY 100 each 1  . furosemide (LASIX) 20 MG tablet Take 2 tablets (40 mg total) by mouth daily. 180 tablet 3   No current facility-administered medications for this encounter.   BP (!) 150/80   Pulse (!) 108  Wt 101.7 kg (224 lb 3.2 oz)   SpO2 92% Comment: 2l n/c  BMI 38.48 kg/m  General: NAD Neck: JVP 8-9 cm with HJR, no thyromegaly or thyroid nodule.  Lungs: mild dry crackles CV: Nondisplaced PMI.  Heart regular S1/S2, no S3/S4, no murmur. 1+ edema to thighs bilaterally.  No carotid bruit.  Normal pedal pulses.  Abdomen: Soft, nontender, no hepatosplenomegaly, no distention.  Skin: Intact without lesions or rashes.  Neurologic: Alert and oriented x 3.  Psych: Normal affect. Extremities: No clubbing or cyanosis.  HEENT: Normal.   Assessment/Plan: 1. Chronic diastolic CHF: Echo in 5/39 showed EF 70-75%,  mild RV enlargement with normal systolic function, PASP 51 mmHg, IVC normal.  On exam, she is volume overloaded.  She has NYHA class IIIb symptoms.  - Increase Lasix to 40 qam/20 qpm x 3 days then 40 mg daily.  BNP today, BMET in 10 days.  - Start spironolactone 12.5 mg daily.  - Arrange for repeat echo.  2. Pulmonary hypertension: PASP 51 mmHg on 4/21 echo with RV enlargement.  Possibly, there is a component of group 3 PH in the setting of interstitial lung disease (PH-ILD).  Alternatively, this could be primarily group 2 PH due to diastolic CHF.  Ultimately, she needs RHC to make this determination.  If she has PH-ILD, she would likely be a candidate for Tyvaso.  - I will arrange for RHC when her platelets have trended up, most recently 18K.   3. ILD: Due to hypersensitivity pneumonitis.  - Needs pulmonary followup.   4. Chronic ITP: Platelets down to 18K recently.  Would like to see them trend up prior to [place].   Followup with me in 3-4 weeks.   Kathy Irwin 08/24/2020

## 2020-08-24 NOTE — Telephone Encounter (Signed)
Called Kathy Irwin regarding fluctuation in weigh and parameters advised to call provider who gave the orders

## 2020-08-24 NOTE — Telephone Encounter (Signed)
Please advice  

## 2020-08-24 NOTE — Patient Instructions (Addendum)
EKG done today.  Labs done today. We will contact you only if your labs are abnormal.  START Spironolactone 12.64m (1/2 tablet) by mouth daily.   INCREASE Lasix to 410m(2 tablets) by mouth every morning and 2019m1 tablet) by mouth every evening for 3 days THEN take 64m87m tablets) by mouth daily.   No other medication changes were made. Please continue all current medications as prescribed.  Please follow up with Dr. MannVaughan BrownerP (Pulmonogist) [phone] physician recommends that you schedule a follow-up appointment in: 10 days for a lab only appointment and in 3-4 weeks for an appointment with Dr. McLeAundra Dubinh an echo prior to your exam.  Your physician has requested that you have an echocardiogram. Echocardiography is a painless test that uses sound waves to create images of your heart. It provides your doctor with information about the size and shape of your heart and how well your heart's chambers and valves are working. This procedure takes approximately one hour. There are no restrictions for this procedure.  If you have any questions or concerns before your next appointment please send us a[place]essage through [place]call our office at [phone] TO LEAVE A MESSAGE FOR THE NURSE SELECT OPTION 2, PLEASE LEAVE A MESSAGE INCLUDING: . YOUR NAME . DATE OF BIRTH . CALL BACK NUMBER . REASON FOR CALL**this is important as we prioritize the call backs  YOU WILL RECEIVE A CALL BACK THE SAME DAY AS LONG AS YOU CALL BEFORE 4:00 PM   Do the following things EVERYDAY: 1) Weigh yourself in the morning before breakfast. Write it down and keep it in a log. 2) Take your medicines as prescribed 3) Eat low salt foods--Limit salt (sodium) to 2000 mg per day.  4) Stay as active as you can everyday 5) Limit all fluids for the day to less than 2 liters   At [facility] and your health needs are our priority. As part of our continuing mission to provide  you with exceptional heart care, we have created designated Provider Care Teams. These Care Teams include your primary Cardiologist (physician) and Advanced Practice Providers (APPs- Physician Assistants and Nurse Practitioners) who all work together to provide you with the care you need, when you need it.   You may see any of the following providers on your designated Care Team at your next follow up: . DrMarland KitchenDaniGlori Bickersr DaltLoralie Champagnemy Darrick Grinder . BritLyda Jester . LaurAudry RilesarmD   Please be sure to bring in all your medications bottles to every appointment.

## 2020-08-24 NOTE — Telephone Encounter (Signed)
Called Lauren to inform tx plan refer to urology.

## 2020-08-24 NOTE — Telephone Encounter (Signed)
Sent to PCP.

## 2020-08-25 ENCOUNTER — Other Ambulatory Visit (INDEPENDENT_AMBULATORY_CARE_PROVIDER_SITE_OTHER): Payer: Self-pay | Admitting: Primary Care

## 2020-08-25 DIAGNOSIS — R82998 Other abnormal findings in urine: Secondary | ICD-10-CM

## 2020-08-25 DIAGNOSIS — R319 Hematuria, unspecified: Secondary | ICD-10-CM

## 2020-08-25 LAB — PATHOLOGIST SMEAR REVIEW

## 2020-08-25 MED ORDER — NITROFURANTOIN MONOHYD MACRO 100 MG PO CAPS: 100.0000 mg | ORAL_CAPSULE | Freq: Two times a day (BID) | ORAL | 0 refills | Status: DC

## 2020-08-25 NOTE — Telephone Encounter (Signed)
Kathy Irwin is calling to confirm the office did received fax result for urine

## 2020-08-25 NOTE — Progress Notes (Signed)
[name] complained of signs and symptoms of UTI with visit.  Completed urinalysis which indicated needed to be sent out for culture.  Results came back with no culture and sensitivity.  Question if contaminated.  Orders given to do In-N-Out cath for UA results indicating "blood trace nitrates positive leukocytes 3+ bacteria moderate and greater than 100,000 CFU/mL of MRSA.  Looking at sensitivity nitrofurantoin would be warranted.  Reached out to infectious disease spoke with Dr. [name] he recommended ZY BL index 600 mg twice daily for 5 days and if not on antidepressant medications or thrombocytopenia.  Also reach out to oncologist because patient is thrombocytopenic and he advise  Dr. [name] oncology.  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 ID will have to determine if this is truly a UTI or if she has a systemic MRSA infection related to her hip that is spilling into the urine and might have to look at other treatment options that are less likely to cause bone marrow suppression. Patient was seen by Dr. [name] and infectious disease discussed above with him he recommended to use a nitrofurantoin which she was on sensitivity and if no improvement consider Linezolid for 3 days it would not make a significant difference in that short period of time.  Nitrofurantoin 500 twice daily sent in for 7 days if infection has not cleared up or her symptoms have improved will refer back to oncology and infectious disease

## 2020-08-25 NOTE — Telephone Encounter (Signed)
Kathy Irwin is aware that results have been received and treatment has been sent for patient.

## 2020-08-28 LAB — BPAM RBC
Blood Product Expiration Date: 202205282359
Unit Type and Rh: 6200

## 2020-08-28 LAB — TYPE AND SCREEN
ABO/RH(D): AB POS
Antibody Screen: NEGATIVE
Unit division: 0

## 2020-08-30 ENCOUNTER — Other Ambulatory Visit (INDEPENDENT_AMBULATORY_CARE_PROVIDER_SITE_OTHER): Payer: Self-pay | Admitting: Primary Care

## 2020-08-30 ENCOUNTER — Other Ambulatory Visit: Payer: Self-pay | Admitting: *Deleted

## 2020-08-30 DIAGNOSIS — E119 Type 2 diabetes mellitus without complications: Secondary | ICD-10-CM

## 2020-08-30 DIAGNOSIS — C9310 Chronic myelomonocytic leukemia not having achieved remission: Secondary | ICD-10-CM

## 2020-08-30 NOTE — Telephone Encounter (Signed)
Potassium dc'd 07/05/20  Vitamin D3 dc'd 12/23/18 Vitamin D-5000 125 micrograms  Historical med.  Pt was prescribed Macrobid 08/25/20 and requesting refill.

## 2020-08-30 NOTE — Progress Notes (Signed)
HEMATOLOGY/ONCOLOGY CONSULTATION NOTE  Date of Service: 08/31/2020  Patient Care Team: Kerin Perna, NP as PCP - General (Internal Medicine) Troy Sine, MD as PCP - Cardiology (Cardiology) Bryn Gulling as Physician Assistant (Orthopedic Surgery) Haddix, Thomasene Lot, MD as Consulting Physician (Orthopedic Surgery)  CHIEF COMPLAINTS/PURPOSE OF CONSULTATION:  CMML Thrombocytopenia  HISTORY OF PRESENTING ILLNESS:   Kathy Irwin is a wonderful 70 y.o. 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 The pt reports that she is doing well overall.  The pt reports that she was diagnosed with CML by [facility] [place] six years ago. 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 They also did a BM Bx which showed that she had chronic leukemia but she is unsure what kind. She has not needed any treatments in 6 years and was only monitored with labs. Her RBC and WBC have remained within the normal range since her diagnosis. Previously her PLTs have gotten as low as 30K but have come back up on their own. She denies any pain or issues related to her leukemia within the last 6 years.   Pt had a recent [place] infection in August. She went back to the hospital on 09/07 with bacterial Pneumonia and was discharged on 09/10. Pt notes that her breathing has improved since that hospitalization but is not back to baseline. She was sent home with oxygen and has been using it. Pt has been using oxygen for about 8 years and she is not sure why it was given to her. Pt was previously diagnosed with Interstitial Lung Disease. She says that when her symptoms worsen she is given steroids for 6 weeks. She is currently on a course of Prednisone. Pt has had sleep apnea testing which revealed that she does not have sleep apnea. Her HTN and Diabetes have been well controlled and she has been taking her medications as prescribed. She also  has home care who has been helping her walk and get some exercise. She will be going to see Dr. Ina Homes, a Pulmonologist, on 10/08.   Most recent lab results (01/09/2019) of CBC w/diff & CMP is as follows: all values are WNL except for RDW at 18.0, PLTs at 117K, nRBC Rel at 1, Glucose at 118.   On review of systems, pt denies back pain, abdominal pain and any other symptoms.   On PMHx the pt reports ILD, HTN, Diabetes, COVID19 infection, bacterial Pneumonia. On Social Hx the pt reports she is a non-smoker and does not drink alcohol  INTERVAL HISTORY:   Kathy Irwin is a wonderful 70 y.o. 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 The patient's last visit with [place] was on 08/10/2020. The pt reports that she is doing well overall. She is here for toxicity check.  The pt reports that she has been dealing with bad cough lately that is productive with clear phlegm. The pt has not been having any fevers or chills, but notes she has been shorter of breath. She notes her leg swelling has worsened since the last visit. The pt notes she is unsure if she is still on antibiotics yet. The pt notes constipation with her last bowel movement being last Sunday. This is because she took Miralax. The pt notes she has difficulty sleeping. She notes much worry regarding this. The pt notes she goes to bed around ( PM and  is awake by 1030 PM for the rest of the night. The pt denies naps during the day. The pt notes they increased her oxygen to 4L now from 2-3. The pt is currently on lasix for her edema. She notes she was taking Nitrofurantoin until Sunday, when she spilled some Ginger Ale over her medications. The pt has not taken this antibiotic nor her blood pressure medication since then. She was supposed to take the Nitrofurantoin twice daily for seven days.   Lab results today 08/31/2020 of CBC w/diff and CMP is as follows: all values are WNL except for RBC of 2.03, Hgb of 6.2, HCT  of 19.5, RDW of 23.6, Plt of 32K, Glucose of 209, Total Protein of 6.4, Albumin of 2.7, Total Bilirubin of 1.5.  On review of systems, pt reports productive cough, SOB, worsened leg swelling, constipation, difficulty sleeping, urinary symptoms due to infection and denies fevers, chills, decreased appetite, abdominal pain, external drainage from hip, black/bloody stools, nose bleed, gum bleed, bleeding issues, and any other symptoms.  MEDICAL HISTORY:  Past Medical History:  Diagnosis Date  . Acute respiratory failure with hypoxia ([place]) 12/2018  . Anemia, unspecified   . CHF (congestive heart failure) ([place])   . CML (chronic myelocytic leukemia) ([place])   . Diabetes mellitus without complication ([place])   . Esophageal reflux   . Hyperlipemia   . Hypersensitivity pneumonitis ([place]) 12/19/2017  . Hypertension   . ILD (interstitial lung disease) ([place]) 12/24/2018    SURGICAL HISTORY: Past Surgical History:  Procedure Laterality Date  . ABDOMINAL HYSTERECTOMY    . INTRAMEDULLARY (IM) NAIL INTERTROCHANTERIC Right 04/30/2020   Procedure: INTRAMEDULLARY (IM) NAIL INTERTROCHANTRIC;  Surgeon: Shona Needles, MD;  Location: [facility];  Service: Orthopedics;  Laterality: Right;  Marland Kitchen VIDEO BRONCHOSCOPY Bilateral 04/02/2019   Procedure: VIDEO BRONCHOSCOPY WITH FLUORO;  Surgeon: Marshell Garfinkel, MD;  Location: [facility];  Service: Cardiopulmonary;  Laterality: Bilateral;    SOCIAL HISTORY: Social History   Socioeconomic History  . Marital status: Married    Spouse name: Not on file  . Number of children: Not on file  . Years of education: Not on file  . Highest education level: Not on file  Occupational History  . Not on file  Tobacco Use  . Smoking status: Never Smoker  . Smokeless tobacco: Never Used  Vaping Use  . Vaping Use: Never used  Substance and Sexual Activity  . Alcohol use: Never  . Drug use: Never  . Sexual activity: Not on file  Other Topics Concern  . Not on file  Social History  Narrative  . Not on file   Social Determinants of Health   Financial Resource Strain: Not on file  Food Insecurity: Not on file  Transportation Needs: Not on file  Physical Activity: Not on file  Stress: Not on file  Social Connections: Not on file  Intimate Partner Violence: Not on file    FAMILY HISTORY: Family History  Problem Relation Age of Onset  . Diabetes Other   . Hypertension Other     ALLERGIES:  is allergic to other, ativan [lorazepam], iodine, iodine i 131 tositumomab, merbromin, and tape.  MEDICATIONS:  Current Outpatient Medications  Medication Sig Dispense Refill  . ACCU-CHEK GUIDE test strip TEST UP TO 4 TIMES A DAY 100 strip 11  . Accu-Chek Softclix Lancets lancets TEST UP TO 4 TIMES A DAY 100 each 1  . acetaminophen (TYLENOL) 500 MG tablet Take 1,000 mg by mouth at bedtime.    Marland Kitchen  albuterol (VENTOLIN HFA) 108 (90 Base) MCG/ACT inhaler INHALE 1-2 PUFFS BY MOUTH EVERY 6 HOURS AS NEEDED FOR WHEEZE OR SHORTNESS OF BREATH 8.5 each 1  . BD PEN NEEDLE NANO 2ND GEN 32G X 4 MM MISC USE WITH LANTUS'    . blood glucose meter kit and supplies KIT Dispense based on patient and insurance preference. Use up to four times daily as directed. (FOR ICD-9 250.00, 250.01). For QAC - HS accuchecks. 1 each 1  . blood glucose meter kit and supplies Dispense based on patient and insurance preference. Use up to four times daily as directed. (FOR ICD-10 E10.9, E11.9). 1 each 0  . CVS TUSSIN DM 200-20 MG/10ML liquid Take 5 mLs by mouth every 4 (four) hours as needed.    . D-5000 125 MCG (5000 UT) TABS Take 1 tablet by mouth daily.    . fluticasone (FLONASE) 50 MCG/ACT nasal spray PLACE 2 SPRAYS INTO BOTH NOSTRILS DAILY AS NEEDED FOR ALLERGIES OR RHINITIS. 18.2 mL 1  . furosemide (LASIX) 20 MG tablet Take 2 tablets (40 mg total) by mouth daily. 180 tablet 3  . HYDROcodone-acetaminophen (NORCO) 10-325 MG tablet Take 1 tablet by mouth every 6 (six) hours as needed. 30 tablet 0  . LANTUS  SOLOSTAR 100 UNIT/ML Solostar Pen SMARTSIG:10 Unit(s) SUB-Q Daily    . linaclotide (LINZESS) 145 MCG CAPS capsule Take 1 capsule (145 mcg total) by mouth daily before breakfast. 90 capsule 1  . metoprolol succinate (TOPROL-XL) 25 MG 24 hr tablet Take 1 tablet (25 mg total) by mouth daily. 90 tablet 1  . Multiple Vitamin (MULTIVITAMIN WITH MINERALS) TABS tablet Take 1 tablet by mouth daily.    . nitrofurantoin, macrocrystal-monohydrate, (MACROBID) 100 MG capsule Take 1 capsule (100 mg total) by mouth 2 (two) times daily. 14 capsule 0  . olopatadine (PATANOL) 0.1 % ophthalmic solution Place 1 drop into both eyes 2 (two) times daily. 5 mL 0  . OXYGEN Inhale 2-3 L into the lungs See admin instructions. Takes 2 L during day time if needed and 3 L at night    . pravastatin (PRAVACHOL) 40 MG tablet Take 1 tablet (40 mg total) by mouth at bedtime. 90 tablet 1  . senna-docusate (SENOKOT-S) 8.6-50 MG tablet Take 1 tablet by mouth 2 (two) times daily. 60 tablet 2  . sertraline (ZOLOFT) 100 MG tablet TAKE 1 TABLET BY MOUTH AT BEDTIME 90 tablet 0  . simethicone (MYLICON) 80 MG chewable tablet Chew 1 tablet (80 mg total) by mouth 4 (four) times daily as needed for flatulence.    Marland Kitchen spironolactone (ALDACTONE) 25 MG tablet Take 0.5 tablets (12.5 mg total) by mouth daily. 45 tablet 3  . triamcinolone cream (KENALOG) 0.1 % Apply topically every 6 (six) hours as needed.     No current facility-administered medications for this visit.   Facility-Administered Medications Ordered in Other Visits  Medication Dose Route Frequency Provider Last Rate Last Admin  . 0.9 %  sodium chloride infusion (Manually program via Guardrails IV Fluids)  250 mL Intravenous Once Brunetta Genera, MD      . acetaminophen (TYLENOL) tablet 650 mg  650 mg Oral Once Brunetta Genera, MD      . diphenhydrAMINE (BENADRYL) capsule 25 mg  25 mg Oral Once Brunetta Genera, MD      . methylPREDNISolone sodium succinate (SOLU-MEDROL) 40  mg/mL injection 40 mg  40 mg Intravenous Once Brunetta Genera, MD      . romiPLOStim (NPLATE)  injection 510 mcg  5 mcg/kg Subcutaneous Once Brunetta Genera, MD        REVIEW OF SYSTEMS:   10 Point review of Systems was done is negative except as noted above.  PHYSICAL EXAMINATION: ECOG PERFORMANCE STATUS: 2 - Symptomatic, <50% confined to bed  . VS reviewed Exam was given in infusion.   GENERAL:alert, in no acute distress and comfortable SKIN: no acute rashes, no significant lesions EYES: conjunctiva are pink and non-injected, sclera anicteric OROPHARYNX: MMM, no exudates, no oropharyngeal erythema or ulceration NECK: supple, no JVD LYMPH:  no palpable lymphadenopathy in the cervical, axillary or inguinal regions LUNGS: clear to auscultation b/l with normal respiratory effort HEART: regular rate & rhythm ABDOMEN:  normoactive bowel sounds , non tender, not distended. Extremity: 2+ leg swelling PSYCH: alert & oriented x 3 with fluent speech NEURO: no focal motor/sensory deficits   LABORATORY DATA:  I have reviewed the data as listed  . CBC Latest Ref Rng & Units 08/31/2020 08/24/2020 08/24/2020  WBC 4.0 - 10.5 K/uL 23.3(H) 11.1(H) 9.6  Hemoglobin 12.0 - 15.0 g/dL 6.2(LL) 7.8(L) 8.3(L)  Hematocrit 36.0 - 46.0 % 19.5(L) 24.8(L) 27.0(L)  Platelets 150 - 400 K/uL 32(L) 21(L) 20(LL)    . CMP Latest Ref Rng & Units 08/31/2020 08/19/2020 08/16/2020  Glucose 70 - 99 mg/dL 209(H) 185(H) 154(H)  BUN 8 - 23 mg/dL _0 Creatinine 0.44 - 1.00 mg/dL 0.66 0.82 0.71  Sodium 135 - 145 mmol/L 135 137 138  Potassium 3.5 - 5.1 mmol/L 4.2 4.9 4.3  Chloride 98 - 111 mmol/L 104 108 106  CO2 22 - 32 mmol/L _1 Calcium 8.9 - 10.3 mg/dL 9.5 9.3 9.1  Total Protein 6.5 - 8.1 g/dL 6.4(L) 6.4(L) 6.4(L)  Total Bilirubin 0.3 - 1.2 mg/dL 1.5(H) 1.4(H) 1.9(H)  Alkaline Phos 38 - 126 U/L 120 135(H) 142(H)  AST 15 - 41 U/L _2 ALT 0 - 44 U/L 7 7 <6   01/21/2019  FISH:    RADIOGRAPHIC STUDIES: I have personally reviewed the radiological images as listed and agreed with the findings in the report. No results found.  ASSESSMENT & PLAN:   1) CMML-1 2)Thrombocytopenia-- likely multifactorial.  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 ITP is likely since platelets improved significantly with recent steroid use. Additional factors worsening her platelet count are infection in her right hip surgical site with drainage and absorption of platelets, multiple lines of antibiotics etc. 3) MRSA UTI - Currently on Nitrofurantoin. Okay to consider Linezolid for short course if necessary from hematologic standpoint.  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 Chemistries stable. -Advised pt that she is currently very anemic. This is most likely due to the [place]. -Recommended pt contact PCP ASAP for getting a new prescription of her Nitrofurantoin and other medications that got messed up by her spill. -Advised pt that if she does not start back on antibiotics for active infection, then the next treatment could be delayed.  -Advised pt her second cycle will be moved back one week due to [facility] Day on 05/30. -Continue to f/u w orthopaedics, infectious disease, and Pulmonologist. -Continue nPlate q1week. Advised pt that even at a high dose, the Plt are not responding very well. -Will try to get 2 units PRBC today instead of 1. If not, then we  will schedule this second unit in the next day or so. -Will see back in 2 weeks with labs (on 06/01)   FOLLOW UP: Follow-up for management of MRSA UTI with PCP and infectious disease Follow-up as scheduled for labs and transfusion support Please add MD visit to her labs and PRBC transfusion appointment on 6/1, okay to overbook. Follow-up as scheduled for cycle 2 [place] starting 6/6 with  labs.    The total time spent in the appointment was 30 minutes and more than 50% was on counseling and direct patient cares.    All of the patient's questions were answered with apparent satisfaction. The patient knows to call the clinic with any problems, questions or concerns.   Sullivan Lone MD [place] AAHIVMS [facility] Hematology/Oncology Physician [facility]  (Office):       [phone] (Work cell):  [phone] (Fax):           [phone]  08/31/2020 2:24 PM  I, Reinaldo Raddle, am acting as scribe for Dr. Sullivan Lone, MD.   .I have reviewed the above documentation for accuracy and completeness, and I agree with the above. Brunetta Genera MD

## 2020-08-30 NOTE — Telephone Encounter (Signed)
Requested Prescriptions  Pending Prescriptions Disp Refills  . pravastatin (PRAVACHOL) 40 MG tablet 90 tablet     Sig: Take 1 tablet (40 mg total) by mouth at bedtime.     Cardiovascular:  Antilipid - Statins Failed - 08/30/2020  4:40 PM      Failed - Total Cholesterol in normal range and within 360 days    Cholesterol, Total  Date Value Ref Range Status  04/04/2019 166 100 - 199 mg/dL Final         Failed - LDL in normal range and within 360 days    LDL Chol Calc (NIH)  Date Value Ref Range Status  04/04/2019 103 (H) 0 - 99 mg/dL Final         Failed - HDL in normal range and within 360 days    HDL  Date Value Ref Range Status  04/04/2019 48 >39 mg/dL Final         Passed - Triglycerides in normal range and within 360 days    Triglycerides  Date Value Ref Range Status  11/15/2019 135 <150 mg/dL Final    Comment:    Performed at [facility], [place] [address], [place], [place] [address]         Passed - Patient is not pregnant      Passed - Valid encounter within last 12 months    Recent Outpatient Visits          3 weeks ago Type 2 diabetes mellitus without complication, without long-term current use of insulin ([place])   [place] Juluis Mire P, NP   4 months ago OAB (overactive bladder)   [facility] Juluis Mire P, NP   6 months ago Chronic respiratory failure with hypoxia ([place])   [place] Juluis Mire P, NP   6 months ago Type 2 diabetes mellitus without complication, without long-term current use of insulin ([place])   [place], Michelle P, NP   8 months ago Hospital discharge follow-up   [place], [facility], NP      Future Appointments            Tomorrow Kerin Perna, NP [place]   In 1 month Marshell Garfinkel, MD [facility]           . pregabalin (LYRICA)  100 MG capsule 90 capsule     Sig: Take 1 capsule (100 mg total) by mouth at bedtime.     Not Delegated - Neurology:  Anticonvulsants - Controlled Failed - 08/30/2020  4:40 PM      Failed - This refill cannot be delegated      Passed - Valid encounter within last 12 months    Recent Outpatient Visits          3 weeks ago Type 2 diabetes mellitus without complication, without long-term current use of insulin ([place])   [place] Juluis Mire P, NP   4 months ago OAB (overactive bladder)   [place] Juluis Mire P, NP   6 months ago Chronic respiratory failure with hypoxia ([place])   [place] Juluis Mire P, NP   6 months ago Type 2 diabetes mellitus without complication, without long-term current use of insulin ([place])   [facility] Kerin Perna, NP   8 months ago  Hospital discharge follow-up   [place] Kerin Perna, NP      Future Appointments            Tomorrow Kerin Perna, NP [place]   In 1 month [place], Praveen, MD [facility]           . metoprolol succinate (TOPROL-XL) 25 MG 24 hr tablet 90 tablet     Sig: Take 1 tablet (25 mg total) by mouth daily.     Cardiovascular:  Beta Blockers Failed - 08/30/2020  4:40 PM      Failed - Last BP in normal range    BP Readings from Last 1 Encounters:  08/24/20 (!) 150/80         Passed - Last Heart Rate in normal range    Pulse Readings from Last 1 Encounters:  08/24/20 (!) 108         Passed - Valid encounter within last 6 months    Recent Outpatient Visits          3 weeks ago Type 2 diabetes mellitus without complication, without long-term current use of insulin ([place])   [place] [facility] Juluis Mire P, NP   4 months ago OAB (overactive bladder)   [place] Juluis Mire P, NP   6  months ago Chronic respiratory failure with hypoxia ([place])   [place] Juluis Mire P, NP   6 months ago Type 2 diabetes mellitus without complication, without long-term current use of insulin ([place])   [place], Michelle P, NP   8 months ago Hospital discharge follow-up   [place], [place], NP      Future Appointments            Tomorrow Kerin Perna, NP [place]   In 1 month Marshell Garfinkel, MD [facility]           . senna-docusate (SENOKOT-S) 8.6-50 MG tablet 60 tablet     Sig: Take 1 tablet by mouth 2 (two) times daily.     Over the Counter:  OTC Passed - 08/30/2020  4:40 PM      Passed - Valid encounter within last 12 months    Recent Outpatient Visits          3 weeks ago Type 2 diabetes mellitus without complication, without long-term current use of insulin ([place])   [place] Juluis Mire P, NP   4 months ago OAB (overactive bladder)   [place] Juluis Mire P, NP   6 months ago Chronic respiratory failure with hypoxia ([place])   [place] Juluis Mire P, NP   6 months ago Type 2 diabetes mellitus without complication, without long-term current use of insulin ([place])   [place], Michelle P, NP   8 months ago Hospital discharge follow-up   [place], [place], NP      Future Appointments            Tomorrow Kerin Perna, NP [place]   In 1 month Mannam, Praveen, MD [facility]           . montelukast (SINGULAIR) 10 MG tablet      Sig: Take 1 tablet (10 mg  total) by mouth at bedtime.     Pulmonology:  Leukotriene Inhibitors Passed - 08/30/2020  4:40 PM      Passed - Valid encounter within last 12 months    Recent Outpatient  Visits          3 weeks ago Type 2 diabetes mellitus without complication, without long-term current use of insulin ([place])   [place] [facility] Juluis Mire P, NP   4 months ago OAB (overactive bladder)   [facility] Juluis Mire P, NP   6 months ago Chronic respiratory failure with hypoxia ([place])   [place] Juluis Mire P, NP   6 months ago Type 2 diabetes mellitus without complication, without long-term current use of insulin ([place])   [place], Michelle P, NP   8 months ago Hospital discharge follow-up   [place], [place], NP      Future Appointments            Tomorrow Kerin Perna, NP [place]   In 1 month Marshell Garfinkel, MD [place] [facility]           . sertraline (ZOLOFT) 100 MG tablet 90 tablet     Sig: TAKE 1 TABLET BY MOUTH AT BEDTIME     Psychiatry:  Antidepressants - SSRI Passed - 08/30/2020  4:40 PM      Passed - Valid encounter within last 6 months    Recent Outpatient Visits          3 weeks ago Type 2 diabetes mellitus without complication, without long-term current use of insulin ([place])   [place] [facility] Juluis Mire P, NP   4 months ago OAB (overactive bladder)   [facility] Juluis Mire P, NP   6 months ago Chronic respiratory failure with hypoxia ([place])   [place] Juluis Mire P, NP   6 months ago Type 2 diabetes mellitus without complication, without long-term current use of insulin ([place])   [place], Michelle P, NP   8 months ago Hospital discharge follow-up   [place], [place], NP      Future Appointments            Tomorrow Kerin Perna, NP [place]   In 1 month Marshell Garfinkel, MD  [facility]           . D-5000 125 MCG (5000 UT) TABS 30 tablet     Sig: Take 1 tablet (5,000 Units total) by mouth daily.     Endocrinology:  Vitamins - Vitamin D Supplementation Failed - 08/30/2020  4:40 PM      Failed - 50,000 IU strengths are not delegated      Failed - Phosphate in normal range and within 360 days    Phosphorus  Date Value Ref Range Status  05/05/2020 2.1 (L) 2.5 - 4.6 mg/dL Final    Comment:    Performed at [facility], [place] [address], [place], [place] [address]         Passed - Ca in normal range and within 360 days    Calcium  Date Value Ref Range Status  08/19/2020 9.3 8.9 - 10.3 mg/dL Final   Calcium, Ion  Date Value Ref Range Status  04/30/2020 1.28 1.15 -  1.40 mmol/L Final         Passed - Vitamin D in normal range and within 360 days    Vit D, 25-Hydroxy  Date Value Ref Range Status  05/01/2020 60.43 30 - 100 ng/mL Final    Comment:    (NOTE) Vitamin D deficiency has been defined by the [place] practice guideline as a level of serum 25-OH  vitamin D less than 20 ng/mL (1,2). The Endocrine Society went on to  further define vitamin D insufficiency as a level between 21 and 29  ng/mL (2).  1. IOM (Institute of Medicine). 2010. Dietary reference intakes for  calcium and D. [place]: The [employer]. 2. Holick MF, Binkley [place], Bischoff-Ferrari HA, et al. Evaluation,  treatment, and prevention of vitamin D deficiency: an Endocrine  Society clinical practice guideline, JCEM. 2011 Jul; 96(7): 1911-30.  Performed at [facility], [place] [address], [place], [place] [address]          Passed - Valid encounter within last 12 months    Recent Outpatient Visits          3 weeks ago Type 2 diabetes mellitus without complication, without long-term current use of insulin ([place])   [place] Juluis Mire P, NP   4 months ago OAB (overactive  bladder)   [facility] Juluis Mire P, NP   6 months ago Chronic respiratory failure with hypoxia ([place])   [place] Kerin Perna, NP   6 months ago Type 2 diabetes mellitus without complication, without long-term current use of insulin ([place])   [place], Michelle P, NP   8 months ago Hospital discharge follow-up   [place], [place], NP      Future Appointments            Tomorrow Kerin Perna, NP [place]   In 1 month Marshell Garfinkel, MD [place] [facility]

## 2020-08-30 NOTE — Telephone Encounter (Signed)
Medication Refill - Medication: potassium, pravastatin, pregabalin, Vitamin D3, montelukast, metoprolol, senna docusate, sertraline, bladder infection medication (Cant remember the name), Lipitor   Has the patient contacted their pharmacy? Yes.   Pts daughter states that she is needing to have this prescription sent in to the pharmacy. Pharmacy states that they cannot refill until new prescription is sent in by PCP. Pt states that pt has been out of medication x4 days. Please advise.  (Agent: If no, request that the patient contact the pharmacy for the refill.) (Agent: If yes, when and what did the pharmacy advise?)  Preferred Pharmacy (with phone number or street name):  [id number] [place] [address] [id number] Phone: [phone]: [phone] Hours: Open 24 hours     Agent: Please be advised that RX refills may take up to 3 business days. We ask that you follow-up with your pharmacy.

## 2020-08-30 NOTE — Telephone Encounter (Signed)
Requested medication (s) are due for refill today: yes  Requested medication (s) are on the the active medication list: yes: pravastatin, pregabalin, metoprolol,D-500                                                                                        NO: Dose of metoprolol was prescribed as 25 mg on 02/05/20  and 50 mg on 04/07/20  Last refill: Pravastatin: 12/30/19; pregabalin: 02/05/20 #90; metoprolol: 02/05/20 #90 1 RF  Future visit scheduled: yes  Notes to clinic: [place]: historical med/provider last RF 04/29/20 D-500: historical med and provider, last RF 07/18/20  Overdue lipid panel, pregabalin is not delegated to NT to RF. Pt has appt tomorrow for lipids and med reconciliation. Pt also wanting reorder of Macrobid.   Requested Prescriptions  Pending Prescriptions Disp Refills   pravastatin (PRAVACHOL) 40 MG tablet 90 tablet     Sig: Take 1 tablet (40 mg total) by mouth at bedtime.      Cardiovascular:  Antilipid - Statins Failed - 08/30/2020  4:40 PM      Failed - Total Cholesterol in normal range and within 360 days    Cholesterol, Total  Date Value Ref Range Status  04/04/2019 166 100 - 199 mg/dL Final          Failed - LDL in normal range and within 360 days    LDL Chol Calc (NIH)  Date Value Ref Range Status  04/04/2019 103 (H) 0 - 99 mg/dL Final          Failed - HDL in normal range and within 360 days    HDL  Date Value Ref Range Status  04/04/2019 48 >39 mg/dL Final          Passed - Triglycerides in normal range and within 360 days    Triglycerides  Date Value Ref Range Status  11/15/2019 135 <150 mg/dL Final    Comment:    Performed at [facility], [place] [address], [place], [place] [address]          Passed - Patient is not pregnant      Passed - Valid encounter within last 12 months    Recent Outpatient Visits           3 weeks ago Type 2 diabetes mellitus without complication, without long-term current use of insulin ([place])   [place] Juluis Mire P, NP   4 months ago OAB (overactive bladder)   [facility] Juluis Mire P, NP   6 months ago Chronic respiratory failure with hypoxia ([place])   [place] Juluis Mire P, NP   6 months ago Type 2 diabetes mellitus without complication, without long-term current use of insulin ([place])   [place], Michelle P, NP   8 months ago Hospital discharge follow-up   [place], [place], NP       Future Appointments             Tomorrow Kerin Perna, NP [place]   In  1 month Mannam, Praveen, MD [facility]               pregabalin (LYRICA) 100 MG capsule 90 capsule     Sig: Take 1 capsule (100 mg total) by mouth at bedtime.      Not Delegated - Neurology:  Anticonvulsants - Controlled Failed - 08/30/2020  4:40 PM      Failed - This refill cannot be delegated      Passed - Valid encounter within last 12 months    Recent Outpatient Visits           3 weeks ago Type 2 diabetes mellitus without complication, without long-term current use of insulin ([place])   [place] Juluis Mire P, NP   4 months ago OAB (overactive bladder)   [place] Juluis Mire P, NP   6 months ago Chronic respiratory failure with hypoxia ([place])   [place] Juluis Mire P, NP   6 months ago Type 2 diabetes mellitus without complication, without long-term current use of insulin ([place])   [place], Michelle P, NP   8 months ago Hospital discharge follow-up   [place], Michelle P, NP       Future Appointments             Tomorrow Kerin Perna, NP [place]   In 1 month Mannam, Praveen, MD [facility]                metoprolol succinate (TOPROL-XL) 25 MG 24 hr tablet 90 tablet     Sig: Take 1 tablet (25 mg total) by mouth daily.      Cardiovascular:  Beta Blockers Failed - 08/30/2020  4:40 PM      Failed - Last BP in normal range    BP Readings from Last 1 Encounters:  08/24/20 (!) 150/80          Passed - Last Heart Rate in normal range    Pulse Readings from Last 1 Encounters:  08/24/20 (!) 108          Passed - Valid encounter within last 6 months    Recent Outpatient Visits           3 weeks ago Type 2 diabetes mellitus without complication, without long-term current use of insulin ([place])   [place] [facility] Juluis Mire P, NP   4 months ago OAB (overactive bladder)   [place] Juluis Mire P, NP   6 months ago Chronic respiratory failure with hypoxia ([place])   [place] Juluis Mire P, NP   6 months ago Type 2 diabetes mellitus without complication, without long-term current use of insulin ([place])   [place], Michelle P, NP   8 months ago Hospital discharge follow-up   [place], Michelle P, NP       Future Appointments             Tomorrow Kerin Perna, NP [place]   In 1 month Mannam, Praveen, MD [facility]               montelukast (SINGULAIR) 10 MG tablet      Sig: Take 1 tablet (10 mg total) by mouth at bedtime.  Pulmonology:  Leukotriene Inhibitors Passed - 08/30/2020  4:40 PM      Passed - Valid encounter within last 12 months    Recent Outpatient Visits           3 weeks ago Type 2 diabetes mellitus without complication, without long-term current use of insulin ([place])   [place] [facility] Juluis Mire P, NP   4 months ago OAB (overactive bladder)   [place] Juluis Mire P, NP   6 months ago Chronic  respiratory failure with hypoxia ([place])   [place] Juluis Mire P, NP   6 months ago Type 2 diabetes mellitus without complication, without long-term current use of insulin ([place])   [place], Michelle P, NP   8 months ago Hospital discharge follow-up   [place], Michelle P, NP       Future Appointments             Tomorrow Kerin Perna, NP [place]   In 1 month Mannam, Praveen, MD [facility]               D-5000 125 MCG (5000 UT) TABS 30 tablet     Sig: Take 1 tablet (5,000 Units total) by mouth daily.      Endocrinology:  Vitamins - Vitamin D Supplementation Failed - 08/30/2020  4:40 PM      Failed - 50,000 IU strengths are not delegated      Failed - Phosphate in normal range and within 360 days    Phosphorus  Date Value Ref Range Status  05/05/2020 2.1 (L) 2.5 - 4.6 mg/dL Final    Comment:    Performed at [facility], [place] [address] [place], [place] [address]          Passed - Ca in normal range and within 360 days    Calcium  Date Value Ref Range Status  08/19/2020 9.3 8.9 - 10.3 mg/dL Final   Calcium, Ion  Date Value Ref Range Status  04/30/2020 1.28 1.15 - 1.40 mmol/L Final          Passed - Vitamin D in normal range and within 360 days    Vit D, 25-Hydroxy  Date Value Ref Range Status  05/01/2020 60.43 30 - 100 ng/mL Final    Comment:    (NOTE) Vitamin D deficiency has been defined by the [place] practice guideline as a level of serum 25-OH  vitamin D less than 20 ng/mL (1,2). The Endocrine Society went on to  further define vitamin D insufficiency as a level between 21 and 29  ng/mL (2).  1. IOM (Institute of Medicine). 2010. Dietary reference intakes for  calcium and D. [place]: The [employer]. 2. Holick MF, Binkley [place], Bischoff-Ferrari HA,  et al. Evaluation,  treatment, and prevention of vitamin D deficiency: an Endocrine  Society clinical practice guideline, JCEM. 2011 Jul; 96(7): 1911-30.  Performed at [facility], [place] [address] [place], [place] [address]           Passed - Valid encounter within last 12 months    Recent Outpatient Visits           3 weeks ago Type 2 diabetes mellitus without complication, without long-term current use of insulin ([place])   [place] Juluis Mire  P, NP   4 months ago OAB (overactive bladder)   [place] [facility] Juluis Mire P, NP   6 months ago Chronic respiratory failure with hypoxia ([place])   [place] Juluis Mire P, NP   6 months ago Type 2 diabetes mellitus without complication, without long-term current use of insulin ([place])   [place], Michelle P, NP   8 months ago Hospital discharge follow-up   [place], Michelle P, NP       Future Appointments             Tomorrow Kerin Perna, NP [place]   In 1 month Mannam, Praveen, MD [facility]              Refused Prescriptions Disp Refills   senna-docusate (SENOKOT-S) 8.6-50 MG tablet 60 tablet     Sig: Take 1 tablet by mouth 2 (two) times daily.      Over the Counter:  OTC Passed - 08/30/2020  4:40 PM      Passed - Valid encounter within last 12 months    Recent Outpatient Visits           3 weeks ago Type 2 diabetes mellitus without complication, without long-term current use of insulin ([place])   [place] Juluis Mire P, NP   4 months ago OAB (overactive bladder)   [place] Juluis Mire P, NP   6 months ago Chronic respiratory failure with hypoxia ([place])   [place] Juluis Mire P, NP   6 months ago Type 2 diabetes mellitus  without complication, without long-term current use of insulin ([place])   [place], Michelle P, NP   8 months ago Hospital discharge follow-up   [place], [place], NP       Future Appointments             Tomorrow Kerin Perna, NP [place]   In 1 month Mannam, Praveen, MD [facility]               sertraline (ZOLOFT) 100 MG tablet 90 tablet     Sig: TAKE 1 TABLET BY MOUTH AT BEDTIME      Psychiatry:  Antidepressants - SSRI Passed - 08/30/2020  4:40 PM      Passed - Valid encounter within last 6 months    Recent Outpatient Visits           3 weeks ago Type 2 diabetes mellitus without complication, without long-term current use of insulin ([place])   [place] [facility] Juluis Mire P, NP   4 months ago OAB (overactive bladder)   [facility] Juluis Mire P, NP   6 months ago Chronic respiratory failure with hypoxia ([place])   [place] Kerin Perna, NP   6 months ago Type 2 diabetes mellitus without complication, without long-term current use of insulin ([place])   [place], Michelle P, NP   8 months ago Hospital discharge follow-up   [place], [place], NP       Future Appointments             Tomorrow Kerin Perna, NP [place]  In 1 month Marshell Garfinkel, MD [facility]

## 2020-08-31 ENCOUNTER — Ambulatory Visit: Payer: 59

## 2020-08-31 ENCOUNTER — Ambulatory Visit (INDEPENDENT_AMBULATORY_CARE_PROVIDER_SITE_OTHER): Payer: 59 | Admitting: Primary Care

## 2020-08-31 ENCOUNTER — Inpatient Hospital Stay: Payer: 59

## 2020-08-31 ENCOUNTER — Other Ambulatory Visit: Payer: Self-pay | Admitting: *Deleted

## 2020-08-31 ENCOUNTER — Other Ambulatory Visit: Payer: Self-pay

## 2020-08-31 ENCOUNTER — Inpatient Hospital Stay (HOSPITAL_BASED_OUTPATIENT_CLINIC_OR_DEPARTMENT_OTHER): Payer: 59 | Admitting: Hematology

## 2020-08-31 ENCOUNTER — Other Ambulatory Visit: Payer: 59

## 2020-08-31 VITALS — BP 106/47 | HR 107 | Temp 98.0°F | Resp 18

## 2020-08-31 DIAGNOSIS — E782 Mixed hyperlipidemia: Secondary | ICD-10-CM | POA: Diagnosis not present

## 2020-08-31 DIAGNOSIS — D693 Immune thrombocytopenic purpura: Secondary | ICD-10-CM

## 2020-08-31 DIAGNOSIS — D649 Anemia, unspecified: Secondary | ICD-10-CM

## 2020-08-31 DIAGNOSIS — R5381 Other malaise: Secondary | ICD-10-CM

## 2020-08-31 DIAGNOSIS — C9310 Chronic myelomonocytic leukemia not having achieved remission: Secondary | ICD-10-CM

## 2020-08-31 DIAGNOSIS — R059 Cough, unspecified: Secondary | ICD-10-CM

## 2020-08-31 DIAGNOSIS — I10 Essential (primary) hypertension: Secondary | ICD-10-CM

## 2020-08-31 DIAGNOSIS — F458 Other somatoform disorders: Secondary | ICD-10-CM | POA: Diagnosis not present

## 2020-08-31 DIAGNOSIS — Z5112 Encounter for antineoplastic immunotherapy: Secondary | ICD-10-CM | POA: Diagnosis not present

## 2020-08-31 DIAGNOSIS — E559 Vitamin D deficiency, unspecified: Secondary | ICD-10-CM

## 2020-08-31 DIAGNOSIS — K5900 Constipation, unspecified: Secondary | ICD-10-CM | POA: Diagnosis not present

## 2020-08-31 DIAGNOSIS — F32A Depression, unspecified: Secondary | ICD-10-CM

## 2020-08-31 LAB — CBC WITH DIFFERENTIAL (CANCER CENTER ONLY)
Abs Immature Granulocytes: 0.5 10*3/uL — ABNORMAL HIGH (ref 0.00–0.07)
Basophils Absolute: 0 10*3/uL (ref 0.0–0.1)
Basophils Relative: 0 %
Eosinophils Absolute: 0 10*3/uL (ref 0.0–0.5)
Eosinophils Relative: 0 %
HCT: 19.5 % — ABNORMAL LOW (ref 36.0–46.0)
Hemoglobin: 6.2 g/dL — CL (ref 12.0–15.0)
Lymphocytes Relative: 19 %
Lymphs Abs: 4.4 10*3/uL — ABNORMAL HIGH (ref 0.7–4.0)
MCH: 30.5 pg (ref 26.0–34.0)
MCHC: 31.8 g/dL (ref 30.0–36.0)
MCV: 96.1 fL (ref 80.0–100.0)
Metamyelocytes Relative: 2 %
Monocytes Absolute: 5.8 10*3/uL — ABNORMAL HIGH (ref 0.1–1.0)
Monocytes Relative: 25 %
Neutro Abs: 12.6 10*3/uL — ABNORMAL HIGH (ref 1.7–7.7)
Neutrophils Relative %: 54 %
Platelet Count: 32 10*3/uL — ABNORMAL LOW (ref 150–400)
RBC: 2.03 MIL/uL — ABNORMAL LOW (ref 3.87–5.11)
RDW: 23.6 % — ABNORMAL HIGH (ref 11.5–15.5)
WBC Count: 23.3 10*3/uL — ABNORMAL HIGH (ref 4.0–10.5)
nRBC: 23 /100 WBC — ABNORMAL HIGH
nRBC: 25.8 % — ABNORMAL HIGH (ref 0.0–0.2)

## 2020-08-31 LAB — CMP (CANCER CENTER ONLY)
ALT: 7 U/L (ref 0–44)
AST: 16 U/L (ref 15–41)
Albumin: 2.7 g/dL — ABNORMAL LOW (ref 3.5–5.0)
Alkaline Phosphatase: 120 U/L (ref 38–126)
Anion gap: 6 (ref 5–15)
BUN: 12 mg/dL (ref 8–23)
CO2: 25 mmol/L (ref 22–32)
Calcium: 9.5 mg/dL (ref 8.9–10.3)
Chloride: 104 mmol/L (ref 98–111)
Creatinine: 0.66 mg/dL (ref 0.44–1.00)
GFR, Estimated: >60 mL/min (ref >60)
Glucose, Bld: 209 mg/dL — ABNORMAL HIGH (ref 70–99)
Potassium: 4.2 mmol/L (ref 3.5–5.1)
Sodium: 135 mmol/L (ref 135–145)
Total Bilirubin: 1.5 mg/dL — ABNORMAL HIGH (ref 0.3–1.2)
Total Protein: 6.4 g/dL — ABNORMAL LOW (ref 6.5–8.1)

## 2020-08-31 LAB — PREPARE RBC (CROSSMATCH)

## 2020-08-31 LAB — SAMPLE TO BLOOD BANK

## 2020-08-31 MED ORDER — ACETAMINOPHEN 325 MG PO TABS
650.0000 mg | ORAL_TABLET | Freq: Once | ORAL | Status: AC
  Administered 2020-08-31: 650 mg via ORAL

## 2020-08-31 MED ORDER — ROMIPLOSTIM INJECTION 500 MCG
920.0000 ug | Freq: Once | SUBCUTANEOUS | Status: AC
  Administered 2020-08-31: 920 ug via SUBCUTANEOUS
  Filled 2020-08-31: qty 1.84

## 2020-08-31 MED ORDER — LINACLOTIDE 145 MCG PO CAPS: 145.0000 ug | ORAL_CAPSULE | Freq: Every day | ORAL | 1 refills | Status: DC

## 2020-08-31 MED ORDER — SODIUM CHLORIDE 0.9% IV SOLUTION
250.0000 mL | Freq: Once | INTRAVENOUS | Status: AC
Start: 2020-08-31 — End: 2020-08-31
  Administered 2020-08-31: 250 mL via INTRAVENOUS
  Filled 2020-08-31: qty 250

## 2020-08-31 MED ORDER — DIPHENHYDRAMINE HCL 25 MG PO CAPS
ORAL_CAPSULE | ORAL | Status: AC
  Filled 2020-08-31: qty 1

## 2020-08-31 MED ORDER — METHYLPREDNISOLONE SODIUM SUCC 40 MG IJ SOLR
40.0000 mg | Freq: Once | INTRAMUSCULAR | Status: AC
  Administered 2020-08-31: 40 mg via INTRAVENOUS

## 2020-08-31 MED ORDER — FUROSEMIDE 10 MG/ML IJ SOLN
INTRAMUSCULAR | Status: AC
  Filled 2020-08-31: qty 2

## 2020-08-31 MED ORDER — SERTRALINE HCL 100 MG PO TABS: ORAL_TABLET | ORAL | 0 refills | Status: DC

## 2020-08-31 MED ORDER — METHYLPREDNISOLONE SODIUM SUCC 40 MG IJ SOLR
INTRAMUSCULAR | Status: AC
  Filled 2020-08-31: qty 1

## 2020-08-31 MED ORDER — DIPHENHYDRAMINE HCL 25 MG PO CAPS
25.0000 mg | ORAL_CAPSULE | Freq: Once | ORAL | Status: AC
Start: 2020-08-31 — End: 2020-08-31
  Administered 2020-08-31: 25 mg via ORAL

## 2020-08-31 MED ORDER — FUROSEMIDE 10 MG/ML IJ SOLN: 20.0000 mg | Freq: Once | INTRAMUSCULAR | Status: DC

## 2020-08-31 MED ORDER — METOPROLOL SUCCINATE ER 25 MG PO TB24: 25.0000 mg | ORAL_TABLET | Freq: Every day | ORAL | 1 refills | Status: DC

## 2020-08-31 MED ORDER — FUROSEMIDE 10 MG/ML IJ SOLN
20.0000 mg | Freq: Once | INTRAMUSCULAR | Status: AC
  Administered 2020-08-31: 20 mg via INTRAMUSCULAR

## 2020-08-31 MED ORDER — ACETAMINOPHEN 325 MG PO TABS
ORAL_TABLET | ORAL | Status: AC
  Filled 2020-08-31: qty 2

## 2020-08-31 NOTE — Progress Notes (Signed)
Critical Value: Hgb 6.2 Sandi, RN notified

## 2020-08-31 NOTE — Progress Notes (Signed)
Established Patient Office Visit  Subjective:  Patient ID: Kathy Irwin, female    DOB: 1951/01/14  Age: 70 y.o. MRN: [id number]  CC: No chief complaint on file.   HPI [name] is a 70 year old female who presents in a wheel chair and portable oxygen for presents for medication refills, concerned with a cough for a week productive with yellow sputum. Requesting refills.  Past Medical History:  Diagnosis Date  . Acute respiratory failure with hypoxia ([place]) 12/2018  . Anemia, unspecified   . CHF (congestive heart failure) ([place])   . CML (chronic myelocytic leukemia) ([place])   . Diabetes mellitus without complication ([place])   . Esophageal reflux   . Hyperlipemia   . Hypersensitivity pneumonitis ([place]) 12/19/2017  . Hypertension   . ILD (interstitial lung disease) ([place]) 12/24/2018    Past Surgical History:  Procedure Laterality Date  . ABDOMINAL HYSTERECTOMY    . INTRAMEDULLARY (IM) NAIL INTERTROCHANTERIC Right 04/30/2020   Procedure: INTRAMEDULLARY (IM) NAIL INTERTROCHANTRIC;  Surgeon: Shona Needles, MD;  Location: [facility];  Service: Orthopedics;  Laterality: Right;  Marland Kitchen VIDEO BRONCHOSCOPY Bilateral 04/02/2019   Procedure: VIDEO BRONCHOSCOPY WITH FLUORO;  Surgeon: Marshell Garfinkel, MD;  Location: [facility];  Service: Cardiopulmonary;  Laterality: Bilateral;    Family History  Problem Relation Age of Onset  . Diabetes Other   . Hypertension Other     Social History   Socioeconomic History  . Marital status: Married    Spouse name: Not on file  . Number of children: Not on file  . Years of education: Not on file  . Highest education level: Not on file  Occupational History  . Not on file  Tobacco Use  . Smoking status: Never Smoker  . Smokeless tobacco: Never Used  Vaping Use  . Vaping Use: Never used  Substance and Sexual Activity  . Alcohol use: Never  . Drug use: Never  . Sexual activity: Not on file  Other Topics Concern  . Not on file  Social History  Narrative  . Not on file   Social Determinants of Health   Financial Resource Strain: Not on file  Food Insecurity: Not on file  Transportation Needs: Not on file  Physical Activity: Not on file  Stress: Not on file  Social Connections: Not on file  Intimate Partner Violence: Not on file    Outpatient Medications Prior to Visit  Medication Sig Dispense Refill  . ACCU-CHEK GUIDE test strip TEST UP TO 4 TIMES A DAY 100 strip 11  . Accu-Chek Softclix Lancets lancets TEST UP TO 4 TIMES A DAY 100 each 1  . acetaminophen (TYLENOL) 500 MG tablet Take 1,000 mg by mouth at bedtime.    Marland Kitchen albuterol (VENTOLIN HFA) 108 (90 Base) MCG/ACT inhaler INHALE 1-2 PUFFS BY MOUTH EVERY 6 HOURS AS NEEDED FOR WHEEZE OR SHORTNESS OF BREATH 8.5 each 1  . BD PEN NEEDLE NANO 2ND GEN 32G X 4 MM MISC USE WITH LANTUS'    . blood glucose meter kit and supplies KIT Dispense based on patient and insurance preference. Use up to four times daily as directed. (FOR ICD-9 250.00, 250.01). For QAC - HS accuchecks. 1 each 1  . blood glucose meter kit and supplies Dispense based on patient and insurance preference. Use up to four times daily as directed. (FOR ICD-10 E10.9, E11.9). 1 each 0  . [facility] TUSSIN DM 200-20 MG/10ML liquid Take 5 mLs by mouth every 4 (four) hours as needed.    Marland Kitchen  D-5000 125 MCG (5000 UT) TABS Take 1 tablet by mouth daily.    . fluticasone (FLONASE) 50 MCG/ACT nasal spray PLACE 2 SPRAYS INTO BOTH NOSTRILS DAILY AS NEEDED FOR ALLERGIES OR RHINITIS. 18.2 mL 1  . furosemide (LASIX) 20 MG tablet Take 2 tablets (40 mg total) by mouth daily. 180 tablet 3  . HYDROcodone-acetaminophen (NORCO) 10-325 MG tablet Take 1 tablet by mouth every 6 (six) hours as needed. 30 tablet 0  . LANTUS SOLOSTAR 100 UNIT/ML Solostar Pen SMARTSIG:10 Unit(s) SUB-Q Daily    . Multiple Vitamin (MULTIVITAMIN WITH MINERALS) TABS tablet Take 1 tablet by mouth daily.    . nitrofurantoin, macrocrystal-monohydrate, (MACROBID) 100 MG capsule Take  1 capsule (100 mg total) by mouth 2 (two) times daily. 14 capsule 0  . olopatadine (PATANOL) 0.1 % ophthalmic solution Place 1 drop into both eyes 2 (two) times daily. 5 mL 0  . OXYGEN Inhale 2-3 L into the lungs See admin instructions. Takes 2 L during day time if needed and 3 L at night    . pravastatin (PRAVACHOL) 40 MG tablet Take 1 tablet (40 mg total) by mouth at bedtime. 90 tablet 1  . senna-docusate (SENOKOT-S) 8.6-50 MG tablet Take 1 tablet by mouth 2 (two) times daily. 60 tablet 2  . simethicone (MYLICON) 80 MG chewable tablet Chew 1 tablet (80 mg total) by mouth 4 (four) times daily as needed for flatulence.    Marland Kitchen spironolactone (ALDACTONE) 25 MG tablet Take 0.5 tablets (12.5 mg total) by mouth daily. 45 tablet 3  . triamcinolone cream (KENALOG) 0.1 % Apply topically every 6 (six) hours as needed.    . famotidine (PEPCID) 40 MG tablet Take 1 tablet (40 mg total) by mouth daily.    . Fluticasone-Salmeterol (ADVAIR) 100-50 MCG/DOSE AEPB Inhale 1 puff into the lungs every 12 (twelve) hours. 60 each 1  . linaclotide (LINZESS) 145 MCG CAPS capsule Take 1 capsule (145 mcg total) by mouth daily before breakfast.    . metoprolol succinate (TOPROL-XL) 25 MG 24 hr tablet Take 1 tablet (25 mg total) by mouth daily. 90 tablet 1  . metoprolol succinate (TOPROL-XL) 50 MG 24 hr tablet TAKE 1 TABLET BY MOUTH EVERY DAY IN THE MORNING 30 tablet 1  . montelukast (SINGULAIR) 10 MG tablet Take 10 mg by mouth at bedtime.    . pantoprazole (PROTONIX) 40 MG tablet Take 1 tablet (40 mg total) by mouth daily. 60 tablet 0  . polyethylene glycol (MIRALAX / GLYCOLAX) 17 g packet Take 17 g by mouth daily as needed for mild constipation or moderate constipation.  0  . pregabalin (LYRICA) 100 MG capsule Take 1 capsule (100 mg total) by mouth at bedtime. 90 capsule 0  . sertraline (ZOLOFT) 100 MG tablet TAKE 1 TABLET BY MOUTH AT BEDTIME 90 tablet 0   No facility-administered medications prior to visit.    Allergies   Allergen Reactions  . Other Shortness Of Breath and Other (See Comments)    Newspaper ink =  new chest pain, also  . Ativan [Lorazepam] Other (See Comments)    Severe confusion and agitation.   . Iodine Other (See Comments)    Unknown reaction  . Iodine I 131 Tositumomab   . Merbromin Other (See Comments)    Unknown reaction - Mercurochrome- "Was a long time ago"   . Tape Rash    ROS Review of Systems  Constitutional: Positive for appetite change.  HENT: Positive for congestion.   Respiratory: Positive for  chest tightness and shortness of breath.   Cardiovascular: Positive for leg swelling.  Musculoskeletal: Positive for arthralgias and gait problem.  Neurological: Positive for headaches.  All other systems reviewed and are negative.     Objective:    Physical Exam Vitals reviewed.  Constitutional:      Appearance: She is obese.  HENT:     Head: Normocephalic.     Right Ear: External ear normal.     Left Ear: External ear normal.     Nose: Nose normal.  Eyes:     Extraocular Movements: Extraocular movements intact.  Cardiovascular:     Rate and Rhythm: Normal rate and regular rhythm.  Pulmonary:     Effort: Pulmonary effort is normal.     Comments: Adventitious BS Abdominal:     General: Bowel sounds are normal. There is distension.  Musculoskeletal:     Cervical back: Normal range of motion. Rigidity present.  Skin:    General: Skin is warm and dry.  Neurological:     Mental Status: She is alert and oriented to person, place, and time.  Psychiatric:        Behavior: Behavior normal.     There were no vitals taken for this visit. Wt Readings from Last 3 Encounters:  08/24/20 224 lb 3.2 oz (101.7 kg)  08/20/20 224 lb 9 oz (101.9 kg)  07/20/20 225 lb 8.5 oz (102.3 kg)     Health Maintenance Due  Topic Date Due  . URINE MICROALBUMIN  Never done  . Hepatitis C Screening  Never done  . COLONOSCOPY (Pts 45-57yr Insurance coverage will need to be  confirmed)  Never done  . PNA vac Low Risk Adult (1 of 2 - PCV13) Never done  . COVID-19 Vaccine (3 - Pfizer risk 4-dose series) 01/20/2020  . OPHTHALMOLOGY EXAM  07/14/2020    There are no preventive care reminders to display for this patient.  Lab Results  Component Value Date   TSH 2.060 04/04/2019   Lab Results  Component Value Date   WBC 23.3 (H) 08/31/2020   HGB 6.2 (LL) 08/31/2020   HCT 19.5 (L) 08/31/2020   MCV 96.1 08/31/2020   PLT 32 (L) 08/31/2020   Lab Results  Component Value Date   NA 135 08/31/2020   K 4.2 08/31/2020   CO2 25 08/31/2020   GLUCOSE 209 (H) 08/31/2020   BUN 12 08/31/2020   CREATININE 0.66 08/31/2020   BILITOT 1.5 (H) 08/31/2020   ALKPHOS 120 08/31/2020   AST 16 08/31/2020   ALT 7 08/31/2020   PROT 6.4 (L) 08/31/2020   ALBUMIN 2.7 (L) 08/31/2020   CALCIUM 9.5 08/31/2020   ANIONGAP 6 08/31/2020   GFR 95.72 03/21/2019   Lab Results  Component Value Date   CHOL 166 04/04/2019   Lab Results  Component Value Date   HDL 48 04/04/2019   Lab Results  Component Value Date   LDLCALC 103 (H) 04/04/2019   Lab Results  Component Value Date   TRIG 135 11/15/2019   Lab Results  Component Value Date   CHOLHDL 3.5 04/04/2019   Lab Results  Component Value Date   HGBA1C 6.0 (A) 08/09/2020      Assessment & Plan:  Diagnoses and all orders for this visit:  Mixed hyperlipidemia Decrease your fatty foods, red meat, cheese, milk and increase fiber like whole grains and veggies. -     Lipid Panel   Physical deconditioning Wheel chair bound , deconditioning , weak  Hyperventilation syndrome Continous O2 /obesity  Constipation, unspecified constipation type Increase water , fruits and vegetables  -     linaclotide (LINZESS) 145 MCG CAPS capsule; Take 1 capsule (145 mcg total) by mouth daily before breakfast.  Vitamin D deficiency -     Vitamin D, 25-hydroxy  Depression, unspecified depression type Manage with SSRI but decling  health is taking a toll on her  -     sertraline (ZOLOFT) 100 MG tablet; TAKE 1 TABLET BY MOUTH AT BEDTIME  Cough -     DG Chest 2 View; Future  Essential hypertension Contact cardiologist ok to refill  -     metoprolol succinate (TOPROL-XL) 25 MG 24 hr tablet; Take 1 tablet (25 mg total) by mouth daily.    Meds ordered this encounter  Medications  . linaclotide (LINZESS) 145 MCG CAPS capsule    Sig: Take 1 capsule (145 mcg total) by mouth daily before breakfast.    Dispense:  90 capsule    Refill:  1  . sertraline (ZOLOFT) 100 MG tablet    Sig: TAKE 1 TABLET BY MOUTH AT BEDTIME    Dispense:  90 tablet    Refill:  0  . metoprolol succinate (TOPROL-XL) 25 MG 24 hr tablet    Sig: Take 1 tablet (25 mg total) by mouth daily.    Dispense:  90 tablet    Refill:  1    Follow-up: No follow-ups on file.    Kerin Perna, NP

## 2020-08-31 NOTE — Telephone Encounter (Signed)
Pt has appointment today.

## 2020-08-31 NOTE — Patient Instructions (Signed)
https://www.redcross[employer].org/donate-blood/blood-donation-process/what-happens-to-donated-blood/blood-transfusions/types-of-blood-transfusions.html"> https://www.redcrossblood.org/donate-blood/blood-donation-process/what-happens-to-donated-blood/blood-transfusions/risks-complications.html">  Blood Transfusion, Adult, Care After This sheet gives you information about how to care for yourself after your procedure. Your health care provider may also give you more specific instructions. If you have problems or questions, contact your health care provider. What can I expect after the procedure? After the procedure, it is common to have:  Bruising and soreness where the IV was inserted.  A fever or chills on the day of the procedure. This may be your body's response to the new blood cells received.  A headache. Follow these instructions at home: IV insertion site care  Follow instructions from your health care provider about how to take care of your IV insertion site. Make sure you: ? Wash your hands with soap and water before and after you change your bandage (dressing). If soap and water are not available, use hand sanitizer. ? Change your dressing as told by your health care provider.  Check your IV insertion site every day for signs of infection. Check for: ? Redness, swelling, or pain. ? Bleeding from the site. ? Warmth. ? Pus or a bad smell.      General instructions  Take over-the-counter and prescription medicines only as told by your health care provider.  Rest as told by your health care provider.  Return to your normal activities as told by your health care provider.  Keep all follow-up visits as told by your health care provider. This is important. Contact a health care provider if:  You have itching or red, swollen areas of skin (hives).  You feel anxious.  You feel weak after doing your normal activities.  You have redness, swelling, warmth, or pain around the IV  insertion site.  You have blood coming from the IV insertion site that does not stop with pressure.  You have pus or a bad smell coming from your IV insertion site. Get help right away if:  You have symptoms of a serious allergic or immune system reaction, including: ? Trouble breathing or shortness of breath. ? Swelling of the face or feeling flushed. ? Fever or chills. ? Pain in the head, back, or chest. ? Dark urine or blood in the urine. ? Widespread rash. ? Fast heartbeat. ? Feeling dizzy or light-headed. If you receive your blood transfusion in an outpatient setting, you will be told whom to contact to report any reactions. These symptoms may represent a serious problem that is an emergency. Do not wait to see if the symptoms will go away. Get medical help right away. Call your local emergency services (911 in the [place] Do not drive yourself to the hospital. Summary  Bruising and tenderness around the IV insertion site are common.  Check your IV insertion site every day for signs of infection.  Rest as told by your health care provider. Return to your normal activities as told by your health care provider.  Get help right away for symptoms of a serious allergic or immune system reaction to blood transfusion. This information is not intended to replace advice given to you by your health care provider. Make sure you discuss any questions you have with your health care provider. Document Revised: 09/26/2018 Document Reviewed: 09/26/2018 Elsevier Patient Education  2021 Elsevier Inc.  

## 2020-08-31 NOTE — Progress Notes (Signed)
Continue Nplate 31mg/kg today. Pt missed a few doses.  [place]aul [place] BC[place], BCOP 08/31/2020 2:50 PM

## 2020-08-31 NOTE — Progress Notes (Signed)
Per MD Irene Limbo, ok to proceed with Nplate today with VS.  1534: Per MD Irene Limbo, pt encouraged to call PCP regarding a new antibiotic prescription. Pt verbalizes understanding.

## 2020-09-01 ENCOUNTER — Other Ambulatory Visit: Payer: Self-pay | Admitting: *Deleted

## 2020-09-01 ENCOUNTER — Inpatient Hospital Stay: Payer: 59

## 2020-09-01 ENCOUNTER — Ambulatory Visit (HOSPITAL_COMMUNITY)
Admission: RE | Admit: 2020-09-01 | Discharge: 2020-09-01 | Disposition: A | Discharge status: Home / Self Care | Payer: 59 | Source: Ambulatory Visit | Attending: Primary Care | Admitting: Primary Care

## 2020-09-01 ENCOUNTER — Telehealth (HOSPITAL_COMMUNITY): Payer: Self-pay | Admitting: *Deleted

## 2020-09-01 DIAGNOSIS — C9310 Chronic myelomonocytic leukemia not having achieved remission: Secondary | ICD-10-CM

## 2020-09-01 DIAGNOSIS — D649 Anemia, unspecified: Secondary | ICD-10-CM

## 2020-09-01 DIAGNOSIS — R059 Cough, unspecified: Secondary | ICD-10-CM | POA: Insufficient documentation

## 2020-09-01 DIAGNOSIS — Z5112 Encounter for antineoplastic immunotherapy: Secondary | ICD-10-CM | POA: Diagnosis not present

## 2020-09-01 LAB — LIPID PANEL
Chol/HDL Ratio: 9 ratio — ABNORMAL HIGH (ref 0.0–4.4)
Cholesterol, Total: 90 mg/dL — ABNORMAL LOW (ref 100–199)
HDL: 10 mg/dL — ABNORMAL LOW (ref >39)
LDL Chol Calc (NIH): 50 mg/dL (ref 0–99)
Triglycerides: 174 mg/dL — ABNORMAL HIGH (ref 0–149)
VLDL Cholesterol Cal: 30 mg/dL (ref 5–40)

## 2020-09-01 LAB — VITAMIN D 25 HYDROXY (VIT D DEFICIENCY, FRACTURES): Vit D, 25-Hydroxy: 64 ng/mL (ref 30.0–100.0)

## 2020-09-01 MED ORDER — ACETAMINOPHEN 325 MG PO TABS
650.0000 mg | ORAL_TABLET | Freq: Once | ORAL | Status: AC
  Administered 2020-09-01: 650 mg via ORAL

## 2020-09-01 MED ORDER — SODIUM CHLORIDE 0.9% IV SOLUTION
250.0000 mL | Freq: Once | INTRAVENOUS | Status: AC
  Administered 2020-09-01: 250 mL via INTRAVENOUS
  Filled 2020-09-01: qty 250

## 2020-09-01 MED ORDER — FUROSEMIDE 10 MG/ML IJ SOLN
20.0000 mg | Freq: Once | INTRAMUSCULAR | Status: AC
Start: 2020-09-01 — End: 2020-09-01
  Administered 2020-09-01: 20 mg via INTRAVENOUS

## 2020-09-01 MED ORDER — METHYLPREDNISOLONE SODIUM SUCC 40 MG IJ SOLR
40.0000 mg | Freq: Once | INTRAMUSCULAR | Status: AC
Start: 2020-09-01 — End: 2020-09-01
  Administered 2020-09-01: 40 mg via INTRAVENOUS

## 2020-09-01 NOTE — Telephone Encounter (Signed)
pts HHRN said pts weight is staying around 215lbs she asked if weight parameters should be between 215-218lbs. Called RN and gave verbal order.  [id number] [phone]

## 2020-09-01 NOTE — Patient Instructions (Signed)
https://www.redcross[employer].org/donate-blood/blood-donation-process/what-happens-to-donated-blood/blood-transfusions/types-of-blood-transfusions.html"> https://www.redcrossblood.org/donate-blood/blood-donation-process/what-happens-to-donated-blood/blood-transfusions/risks-complications.html">  Blood Transfusion, Adult, Care After This sheet gives you information about how to care for yourself after your procedure. Your health care provider may also give you more specific instructions. If you have problems or questions, contact your health care provider. What can I expect after the procedure? After the procedure, it is common to have:  Bruising and soreness where the IV was inserted.  A fever or chills on the day of the procedure. This may be your body's response to the new blood cells received.  A headache. Follow these instructions at home: IV insertion site care  Follow instructions from your health care provider about how to take care of your IV insertion site. Make sure you: ? Wash your hands with soap and water before and after you change your bandage (dressing). If soap and water are not available, use hand sanitizer. ? Change your dressing as told by your health care provider.  Check your IV insertion site every day for signs of infection. Check for: ? Redness, swelling, or pain. ? Bleeding from the site. ? Warmth. ? Pus or a bad smell.      General instructions  Take over-the-counter and prescription medicines only as told by your health care provider.  Rest as told by your health care provider.  Return to your normal activities as told by your health care provider.  Keep all follow-up visits as told by your health care provider. This is important. Contact a health care provider if:  You have itching or red, swollen areas of skin (hives).  You feel anxious.  You feel weak after doing your normal activities.  You have redness, swelling, warmth, or pain around the IV  insertion site.  You have blood coming from the IV insertion site that does not stop with pressure.  You have pus or a bad smell coming from your IV insertion site. Get help right away if:  You have symptoms of a serious allergic or immune system reaction, including: ? Trouble breathing or shortness of breath. ? Swelling of the face or feeling flushed. ? Fever or chills. ? Pain in the head, back, or chest. ? Dark urine or blood in the urine. ? Widespread rash. ? Fast heartbeat. ? Feeling dizzy or light-headed. If you receive your blood transfusion in an outpatient setting, you will be told whom to contact to report any reactions. These symptoms may represent a serious problem that is an emergency. Do not wait to see if the symptoms will go away. Get medical help right away. Call your local emergency services (911 in the [place] Do not drive yourself to the hospital. Summary  Bruising and tenderness around the IV insertion site are common.  Check your IV insertion site every day for signs of infection.  Rest as told by your health care provider. Return to your normal activities as told by your health care provider.  Get help right away for symptoms of a serious allergic or immune system reaction to blood transfusion. This information is not intended to replace advice given to you by your health care provider. Make sure you discuss any questions you have with your health care provider. Document Revised: 09/26/2018 Document Reviewed: 09/26/2018 Elsevier Patient Education  2021 Elsevier Inc.  

## 2020-09-02 ENCOUNTER — Other Ambulatory Visit (INDEPENDENT_AMBULATORY_CARE_PROVIDER_SITE_OTHER): Payer: Self-pay | Admitting: Primary Care

## 2020-09-02 ENCOUNTER — Telehealth: Payer: Self-pay | Admitting: *Deleted

## 2020-09-02 LAB — BPAM RBC
Blood Product Expiration Date: 202206042359
Blood Product Expiration Date: 202206042359
ISSUE DATE / TIME: 202205171352
ISSUE DATE / TIME: 202205181510
Unit Type and Rh: 6200
Unit Type and Rh: 6200

## 2020-09-02 LAB — TYPE AND SCREEN
ABO/RH(D): AB POS
Antibody Screen: NEGATIVE
Unit division: 0
Unit division: 0

## 2020-09-02 NOTE — Telephone Encounter (Signed)
Pt called requesting results of chest xray.  Per Dr. Irene Limbo, chest xray was ordered by PCP and pt should call their office for results.  LVM with patient.

## 2020-09-03 ENCOUNTER — Other Ambulatory Visit: Payer: Self-pay

## 2020-09-03 ENCOUNTER — Ambulatory Visit (HOSPITAL_COMMUNITY)
Admission: RE | Admit: 2020-09-03 | Discharge: 2020-09-03 | Disposition: A | Discharge status: Home / Self Care | Payer: 59 | Source: Ambulatory Visit | Attending: Cardiology | Admitting: Cardiology

## 2020-09-03 ENCOUNTER — Ambulatory Visit: Payer: 59 | Attending: Critical Care Medicine

## 2020-09-03 DIAGNOSIS — I5032 Chronic diastolic (congestive) heart failure: Secondary | ICD-10-CM

## 2020-09-03 DIAGNOSIS — Z20822 Contact with and (suspected) exposure to covid-19: Secondary | ICD-10-CM

## 2020-09-03 LAB — BASIC METABOLIC PANEL
Anion gap: 8 (ref 5–15)
BUN: 18 mg/dL (ref 8–23)
CO2: 27 mmol/L (ref 22–32)
Calcium: 9.1 mg/dL (ref 8.9–10.3)
Chloride: 102 mmol/L (ref 98–111)
Creatinine, Ser: 0.65 mg/dL (ref 0.44–1.00)
GFR, Estimated: >60 mL/min (ref >60)
Glucose, Bld: 357 mg/dL — ABNORMAL HIGH (ref 70–99)
Potassium: 4.1 mmol/L (ref 3.5–5.1)
Sodium: 137 mmol/L (ref 135–145)

## 2020-09-03 NOTE — Addendum Note (Signed)
Addended by: Scot Dock on: 09/03/2020 07:00 PM   Modules accepted: Orders

## 2020-09-04 LAB — SARS-COV-2, NAA 2 DAY TAT

## 2020-09-04 LAB — NOVEL CORONAVIRUS, NAA: SARS-CoV-2, NAA: NOT DETECTED

## 2020-09-06 ENCOUNTER — Encounter: Payer: Self-pay | Admitting: Hematology

## 2020-09-06 NOTE — Progress Notes (Signed)
Pt returned my call and she would like to apply for the [place] so she will bring her proof of income on 09/07/20.  If approved I will give her an expense sheet and my card for any questions or concerns she may have in the future.

## 2020-09-07 ENCOUNTER — Inpatient Hospital Stay: Payer: 59

## 2020-09-07 ENCOUNTER — Other Ambulatory Visit: Payer: Self-pay | Admitting: *Deleted

## 2020-09-07 ENCOUNTER — Encounter: Payer: Self-pay | Admitting: Hematology

## 2020-09-07 ENCOUNTER — Other Ambulatory Visit: Payer: Self-pay

## 2020-09-07 VITALS — BP 132/62 | HR 90 | Temp 98.0°F | Resp 17

## 2020-09-07 DIAGNOSIS — Z5112 Encounter for antineoplastic immunotherapy: Secondary | ICD-10-CM | POA: Diagnosis not present

## 2020-09-07 DIAGNOSIS — C9310 Chronic myelomonocytic leukemia not having achieved remission: Secondary | ICD-10-CM

## 2020-09-07 DIAGNOSIS — D649 Anemia, unspecified: Secondary | ICD-10-CM

## 2020-09-07 DIAGNOSIS — D693 Immune thrombocytopenic purpura: Secondary | ICD-10-CM

## 2020-09-07 LAB — CBC WITH DIFFERENTIAL (CANCER CENTER ONLY)
Abs Immature Granulocytes: 0.12 10*3/uL — ABNORMAL HIGH (ref 0.00–0.07)
Basophils Absolute: 0 10*3/uL (ref 0.0–0.1)
Basophils Relative: 0 %
Eosinophils Absolute: 0 10*3/uL (ref 0.0–0.5)
Eosinophils Relative: 0 %
HCT: 23.8 % — ABNORMAL LOW (ref 36.0–46.0)
Hemoglobin: 7.4 g/dL — ABNORMAL LOW (ref 12.0–15.0)
Immature Granulocytes: 1 %
Lymphocytes Relative: 28 %
Lymphs Abs: 2.7 10*3/uL (ref 0.7–4.0)
MCH: 30.5 pg (ref 26.0–34.0)
MCHC: 31.1 g/dL (ref 30.0–36.0)
MCV: 97.9 fL (ref 80.0–100.0)
Monocytes Absolute: 3.4 10*3/uL — ABNORMAL HIGH (ref 0.1–1.0)
Monocytes Relative: 37 %
Neutro Abs: 3.2 10*3/uL (ref 1.7–7.7)
Neutrophils Relative %: 34 %
Platelet Count: 28 10*3/uL — ABNORMAL LOW (ref 150–400)
RBC: 2.43 MIL/uL — ABNORMAL LOW (ref 3.87–5.11)
RDW: 21.8 % — ABNORMAL HIGH (ref 11.5–15.5)
WBC Count: 9.4 10*3/uL (ref 4.0–10.5)
nRBC: 10.6 % — ABNORMAL HIGH (ref 0.0–0.2)

## 2020-09-07 LAB — PREPARE RBC (CROSSMATCH)

## 2020-09-07 LAB — SAMPLE TO BLOOD BANK

## 2020-09-07 MED ORDER — ACETAMINOPHEN 325 MG PO TABS
650.0000 mg | ORAL_TABLET | Freq: Once | ORAL | Status: AC
Start: 2020-09-07 — End: 2020-09-07
  Administered 2020-09-07: 650 mg via ORAL

## 2020-09-07 MED ORDER — FUROSEMIDE 10 MG/ML IJ SOLN
20.0000 mg | Freq: Once | INTRAMUSCULAR | Status: AC
Start: 2020-09-07 — End: 2020-09-07
  Administered 2020-09-07: 20 mg via INTRAVENOUS

## 2020-09-07 MED ORDER — METHYLPREDNISOLONE SODIUM SUCC 40 MG IJ SOLR
40.0000 mg | Freq: Once | INTRAMUSCULAR | Status: AC
  Administered 2020-09-07: 40 mg via INTRAVENOUS

## 2020-09-07 MED ORDER — METHYLPREDNISOLONE SODIUM SUCC 40 MG IJ SOLR
INTRAMUSCULAR | Status: AC
  Filled 2020-09-07: qty 1

## 2020-09-07 MED ORDER — ACETAMINOPHEN 325 MG PO TABS
ORAL_TABLET | ORAL | Status: AC
  Filled 2020-09-07: qty 2

## 2020-09-07 MED ORDER — SODIUM CHLORIDE 0.9% IV SOLUTION
250.0000 mL | Freq: Once | INTRAVENOUS | Status: AC
  Administered 2020-09-07: 250 mL via INTRAVENOUS
  Filled 2020-09-07: qty 250

## 2020-09-07 MED ORDER — FUROSEMIDE 10 MG/ML IJ SOLN
INTRAMUSCULAR | Status: AC
  Filled 2020-09-07: qty 2

## 2020-09-07 MED ORDER — ROMIPLOSTIM INJECTION 500 MCG
1000.0000 ug | Freq: Once | SUBCUTANEOUS | Status: AC
  Administered 2020-09-07: 1000 ug via SUBCUTANEOUS
  Filled 2020-09-07: qty 2

## 2020-09-07 NOTE — Patient Instructions (Signed)
https://www.redcrossblood.org/donate-blood/blood-donation-process/what-happens-to-donated-blood/blood-transfusions/types-of-blood-transfusions.html"> https://www.redcrossblood.org/donate-blood/blood-donation-process/what-happens-to-donated-blood/blood-transfusions/risks-complications.html">  Blood Transfusion, Adult, Care After This sheet gives you information about how to care for yourself after your procedure. Your health care provider may also give you more specific instructions. If you have problems or questions, contact your health care provider. What can I expect after the procedure? After the procedure, it is common to have:  Bruising and soreness where the IV was inserted.  A fever or chills on the day of the procedure. This may be your body's response to the new blood cells received.  A headache. Follow these instructions at home: IV insertion site care  Follow instructions from your health care provider about how to take care of your IV insertion site. Make sure you: ? Wash your hands with soap and water before and after you change your bandage (dressing). If soap and water are not available, use hand sanitizer. ? Change your dressing as told by your health care provider.  Check your IV insertion site every day for signs of infection. Check for: ? Redness, swelling, or pain. ? Bleeding from the site. ? Warmth. ? Pus or a bad smell.      General instructions  Take over-the-counter and prescription medicines only as told by your health care provider.  Rest as told by your health care provider.  Return to your normal activities as told by your health care provider.  Keep all follow-up visits as told by your health care provider. This is important. Contact a health care provider if:  You have itching or red, swollen areas of skin (hives).  You feel anxious.  You feel weak after doing your normal activities.  You have redness, swelling, warmth, or pain around the IV  insertion site.  You have blood coming from the IV insertion site that does not stop with pressure.  You have pus or a bad smell coming from your IV insertion site. Get help right away if:  You have symptoms of a serious allergic or immune system reaction, including: ? Trouble breathing or shortness of breath. ? Swelling of the face or feeling flushed. ? Fever or chills. ? Pain in the head, back, or chest. ? Dark urine or blood in the urine. ? Widespread rash. ? Fast heartbeat. ? Feeling dizzy or light-headed. If you receive your blood transfusion in an outpatient setting, you will be told whom to contact to report any reactions. These symptoms may represent a serious problem that is an emergency. Do not wait to see if the symptoms will go away. Get medical help right away. Call your local emergency services (911 in the [place] Do not drive yourself to the hospital. Summary  Bruising and tenderness around the IV insertion site are common.  Check your IV insertion site every day for signs of infection.  Rest as told by your health care provider. Return to your normal activities as told by your health care provider.  Get help right away for symptoms of a serious allergic or immune system reaction to blood transfusion. This information is not intended to replace advice given to you by your health care provider. Make sure you discuss any questions you have with your health care provider. Document Revised: 09/26/2018 Document Reviewed: 09/26/2018 Elsevier Patient Education  [place].

## 2020-09-07 NOTE — Progress Notes (Signed)
Pt is approved for the $1000 [employer].

## 2020-09-07 NOTE — Progress Notes (Signed)
Verbal order per Dr. Irene Limbo: Transfuse 1 unit PRBCs today as scheduled. Pre meds: Tylenol 650 mg po; Solumedrol 40 mg IV and Lasix 20 mg IV. Orders confirmed with [place] @ [facility]

## 2020-09-08 LAB — TYPE AND SCREEN
ABO/RH(D): AB POS
Antibody Screen: NEGATIVE
Unit division: 0

## 2020-09-08 LAB — BPAM RBC
Blood Product Expiration Date: 202206202359
ISSUE DATE / TIME: 202205241558
Unit Type and Rh: 6200

## 2020-09-14 ENCOUNTER — Other Ambulatory Visit: Payer: 59

## 2020-09-14 ENCOUNTER — Encounter (HOSPITAL_COMMUNITY): Payer: Self-pay | Admitting: Cardiology

## 2020-09-14 ENCOUNTER — Other Ambulatory Visit: Payer: Self-pay

## 2020-09-14 ENCOUNTER — Telehealth: Payer: Self-pay

## 2020-09-14 ENCOUNTER — Ambulatory Visit (HOSPITAL_COMMUNITY)
Admission: RE | Admit: 2020-09-14 | Discharge: 2020-09-14 | Disposition: A | Discharge status: Home / Self Care | Payer: 59 | Source: Ambulatory Visit | Attending: Primary Care | Admitting: Primary Care

## 2020-09-14 ENCOUNTER — Ambulatory Visit: Payer: 59

## 2020-09-14 ENCOUNTER — Ambulatory Visit (HOSPITAL_BASED_OUTPATIENT_CLINIC_OR_DEPARTMENT_OTHER)
Admission: RE | Admit: 2020-09-14 | Discharge: 2020-09-14 | Disposition: A | Discharge status: Home / Self Care | Payer: 59 | Source: Ambulatory Visit | Attending: Cardiology | Admitting: Cardiology

## 2020-09-14 ENCOUNTER — Telehealth: Payer: Self-pay | Admitting: Hematology

## 2020-09-14 VITALS — BP 94/50 | HR 73 | Wt 225.0 lb

## 2020-09-14 DIAGNOSIS — I5032 Chronic diastolic (congestive) heart failure: Secondary | ICD-10-CM | POA: Insufficient documentation

## 2020-09-14 DIAGNOSIS — Z79899 Other long term (current) drug therapy: Secondary | ICD-10-CM | POA: Insufficient documentation

## 2020-09-14 DIAGNOSIS — Z8616 Personal history of COVID-19: Secondary | ICD-10-CM | POA: Diagnosis not present

## 2020-09-14 DIAGNOSIS — Z7984 Long term (current) use of oral hypoglycemic drugs: Secondary | ICD-10-CM | POA: Diagnosis not present

## 2020-09-14 DIAGNOSIS — I272 Pulmonary hypertension, unspecified: Secondary | ICD-10-CM | POA: Insufficient documentation

## 2020-09-14 DIAGNOSIS — E785 Hyperlipidemia, unspecified: Secondary | ICD-10-CM | POA: Insufficient documentation

## 2020-09-14 DIAGNOSIS — I700 Atherosclerosis of aorta: Secondary | ICD-10-CM | POA: Diagnosis not present

## 2020-09-14 DIAGNOSIS — D693 Immune thrombocytopenic purpura: Secondary | ICD-10-CM | POA: Diagnosis not present

## 2020-09-14 DIAGNOSIS — I110 Hypertensive heart disease with heart failure: Secondary | ICD-10-CM | POA: Insufficient documentation

## 2020-09-14 DIAGNOSIS — J849 Interstitial pulmonary disease, unspecified: Secondary | ICD-10-CM

## 2020-09-14 DIAGNOSIS — I071 Rheumatic tricuspid insufficiency: Secondary | ICD-10-CM | POA: Diagnosis not present

## 2020-09-14 DIAGNOSIS — J984 Other disorders of lung: Secondary | ICD-10-CM | POA: Insufficient documentation

## 2020-09-14 DIAGNOSIS — E119 Type 2 diabetes mellitus without complications: Secondary | ICD-10-CM | POA: Diagnosis not present

## 2020-09-14 DIAGNOSIS — Z6837 Body mass index (BMI) 37.0-37.9, adult: Secondary | ICD-10-CM | POA: Diagnosis not present

## 2020-09-14 DIAGNOSIS — C9310 Chronic myelomonocytic leukemia not having achieved remission: Secondary | ICD-10-CM

## 2020-09-14 LAB — ECHOCARDIOGRAM COMPLETE
AR max vel: 2.07 cm2
AV Area VTI: 2.14 cm2
AV Area mean vel: 2.07 cm2
AV Mean grad: 11 mmHg
AV Peak grad: 20.9 mmHg
Ao pk vel: 2.29 m/s
Area-P 1/2: 5.38 cm2
S' Lateral: 2.7 cm
Single Plane A4C EF: 76.2 %

## 2020-09-14 LAB — CBC
HCT: 21.6 % — ABNORMAL LOW (ref 36.0–46.0)
Hemoglobin: 6.6 g/dL — CL (ref 12.0–15.0)
MCH: 29.1 pg (ref 26.0–34.0)
MCHC: 30.6 g/dL (ref 30.0–36.0)
MCV: 95.2 fL (ref 80.0–100.0)
Platelets: 27 10*3/uL — CL (ref 150–400)
RBC: 2.27 MIL/uL — ABNORMAL LOW (ref 3.87–5.11)
RDW: 21.3 % — ABNORMAL HIGH (ref 11.5–15.5)
WBC: 6.4 10*3/uL (ref 4.0–10.5)
nRBC: 16.2 % — ABNORMAL HIGH (ref 0.0–0.2)

## 2020-09-14 LAB — BASIC METABOLIC PANEL
Anion gap: 7 (ref 5–15)
BUN: 30 mg/dL — ABNORMAL HIGH (ref 8–23)
CO2: 24 mmol/L (ref 22–32)
Calcium: 9.6 mg/dL (ref 8.9–10.3)
Chloride: 105 mmol/L (ref 98–111)
Creatinine, Ser: 0.96 mg/dL (ref 0.44–1.00)
GFR, Estimated: >60 mL/min (ref >60)
Glucose, Bld: 103 mg/dL — ABNORMAL HIGH (ref 70–99)
Potassium: 5.3 mmol/L — ABNORMAL HIGH (ref 3.5–5.1)
Sodium: 136 mmol/L (ref 135–145)

## 2020-09-14 LAB — BRAIN NATRIURETIC PEPTIDE: B Natriuretic Peptide: 192.1 pg/mL — ABNORMAL HIGH (ref 0.0–100.0)

## 2020-09-14 NOTE — Telephone Encounter (Signed)
Copied from [place] [id number]. Topic: General - Other >> Sep 08, 2020  2:30 PM Erick Blinks wrote: Pt is complaining of coughing, she is still concerned. Lungs reported clear. Coughing up clear light flem.  Olivia Mackie, [facility]  High Heart Rate 111 earlier this morning  Best contact: [phone]   Please advice

## 2020-09-14 NOTE — Patient Instructions (Addendum)
Labs done today. We will contact you only if your labs are abnormal.  No medication changes were made. Please continue all current medications as prescribed.  Please wear your compression hose daily, place them on as soon as you get up in the morning and remove before you go to bed at night. A prescription was provided to you today during your office visit. You can take this to your local pharmacy or go to [facility] supply located at [address] [place], [place] [address] [phone]  Your physician recommends that you schedule a follow-up appointment in: 6 weeks with Dr. Aundra Dubin.  If you have any questions or concerns before your next appointment please send [place] a message through [place] or call our office at [phone].    TO LEAVE A MESSAGE FOR THE NURSE SELECT OPTION 2, PLEASE LEAVE A MESSAGE INCLUDING: . YOUR NAME . DATE OF BIRTH . CALL BACK NUMBER . REASON FOR CALL**this is important as we prioritize the call backs  YOU WILL RECEIVE A CALL BACK THE SAME DAY AS LONG AS YOU CALL BEFORE 4:00 PM   Do the following things EVERYDAY: 1) Weigh yourself in the morning before breakfast. Write it down and keep it in a log. 2) Take your medicines as prescribed 3) Eat low salt foods--Limit salt (sodium) to 2000 mg per day.  4) Stay as active as you can everyday 5) Limit all fluids for the day to less than 2 liters   At the [facility], you and your health needs are our priority. As part of our continuing mission to provide you with exceptional heart care, we have created designated Provider Care Teams. These Care Teams include your primary Cardiologist (physician) and Advanced Practice Providers (APPs- Physician Assistants and Nurse Practitioners) who all work together to provide you with the care you need, when you need it.   You may see any of the following providers on your designated Care Team at your next follow up: Marland Kitchen Dr Glori Bickers . Dr Loralie Champagne . Darrick Grinder, NP . Lyda Jester, PA . Audry Riles, PharmD   Please be sure to bring in all your medications bottles to every appointment.    You are scheduled for a Cardiac Catheterization on Friday, June 3 with Dr. Loralie Champagne.  1. Please arrive at the [facility] (Main Entrance A) at [facility]: [address] [place], [place] [address] at 11:00 AM (This time is two hours before your procedure to ensure your preparation). Free valet parking service is available.   Special note: Every effort is made to have your procedure done on time. Please understand that emergencies sometimes delay scheduled procedures.  2. Diet: Do not eat solid foods after midnight.  The patient may have clear liquids until 5am upon the day of the procedure.  3. Medication instructions in preparation for your procedure  Stop taking, Lasix (Furosemide)  Friday, June 3,  Take only 5 units of insulin the night before your procedure. Do not take any insulin on the day of the procedure.  On the morning of your procedure, take your and any morning medicines NOT listed above.  You may use sips of water.  5. Plan for one night stay--bring personal belongings. 6. Bring a current list of your medications and current insurance cards. 7. You MUST have a responsible person to drive you home. 8. Someone MUST be with you the first 24 hours after you arrive home or your discharge  will be delayed. 9. Please wear clothes that are easy to get on and off and wear slip-on shoes.  Thank you for allowing [place] to care for you!   -- [facility]

## 2020-09-14 NOTE — Progress Notes (Signed)
ReDS Vest / Clip - 09/14/20 1200      ReDS Vest / Clip   Station Marker B    Ruler Value 34    ReDS Value Range Low volume    ReDS Actual Value 35

## 2020-09-14 NOTE — Progress Notes (Signed)
  Echocardiogram 2D Echocardiogram has been performed.  Kathy Irwin 09/14/2020, 9:56 AM

## 2020-09-14 NOTE — Progress Notes (Signed)
HEMATOLOGY/ONCOLOGY CONSULTATION NOTE  Date of Service: 09/15/2020  Patient Care Team: Kerin Perna, NP as PCP - General (Internal Medicine) Troy Sine, MD as PCP - Cardiology (Cardiology) Bryn Gulling as Physician Assistant (Orthopedic Surgery) Haddix, Thomasene Lot, MD as Consulting Physician (Orthopedic Surgery)  CHIEF COMPLAINTS/PURPOSE OF CONSULTATION:  CMML Thrombocytopenia  HISTORY OF PRESENTING ILLNESS:   Kathy Irwin is a wonderful 70 y.o. 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 The pt reports that she is doing well overall.  The pt reports that she was diagnosed with CML by [facility] at [place] six years ago. 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 They also did a BM Bx which showed that she had chronic leukemia but she is unsure what kind. She has not needed any treatments in 6 years and was only monitored with labs. Her RBC and WBC have remained within the normal range since her diagnosis. Previously her PLTs have gotten as low as 30K but have come back up on their own. She denies any pain or issues related to her leukemia within the last 6 years.   Pt had a recent [place] infection in August. She went back to the hospital on 09/07 with bacterial Pneumonia and was discharged on 09/10. Pt notes that her breathing has improved since that hospitalization but is not back to baseline. She was sent home with oxygen and has been using it. Pt has been using oxygen for about 8 years and she is not sure why it was given to her. Pt was previously diagnosed with Interstitial Lung Disease. She says that when her symptoms worsen she is given steroids for 6 weeks. She is currently on a course of Prednisone. Pt has had sleep apnea testing which revealed that she does not have sleep apnea. Her HTN and Diabetes have been well controlled and she has been taking her medications as prescribed. She also has  home care who has been helping her walk and get some exercise. She will be going to see Dr. Ina Homes, a Pulmonologist, on 10/08.   Most recent lab results (01/09/2019) of CBC w/diff & CMP is as follows: all values are WNL except for RDW at 18.0, PLTs at 117K, nRBC Rel at 1, Glucose at 118.   On review of systems, pt denies back pain, abdominal pain and any other symptoms.   On PMHx the pt reports ILD, HTN, Diabetes, COVID19 infection, bacterial Pneumonia. On Social Hx the pt reports she is a non-smoker and does not drink alcohol  INTERVAL HISTORY:   Kathy Irwin is a wonderful 70 y.o. 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 The patient's last visit with [place] was on 08/31/2020. The pt reports that she is doing well overall.  The pt reports that she has been feeling nauseous and SOB today. The pt notes that between labs and waiting in the blue room for treatment, she was not connected to her oxygen. She continues to have great difficulty sleeping, averaging 2-3 hours of sleep each night. She denies any naps throughout the day. She still cannot raise her leg where she had the drainage in hip. She has a f/u with her orthopedic in 6 weeks after getting a cortisone shot in that right hip.  The pt notes she is also barely eating due to her stomach. She has regular bowel movements but gets full very  easily and does not have any appetite.  She notes that she is only taking the insulin once daily and is no longer on the Metformin. She notes her PCP does not help her very much and is very frustrated. She has been out of Lyrica for a while now. The pt notes her blood sugars have been very stable at home.  Lab results today 09/15/2020 of CBC w/diff and CMP is as follows: all values are WNL except for RBC of 2.28, Hgb of 6.7, HCT of 21.4, RDW of 21.4, Plt of 37K, nRBC of 16.1, Lymphs Abs of 5.0K, Monocytes Abs of 2.3K, Potassium of 5.4, CO2 of 18, Glucose of 183, BUN of 34,  Total protein of 6.3, Albumin of 2.8, AST of 14, Alkaline Phosphatase of 130, Total Bilirubin of 1.4.  On review of systems, pt reports fatigue, SOB, nausea, difficulty sleeping, decreased appetite and denies bleeding issues, constipation, changes in bowel habits, fevers, chills, abdominal pain, and any other symptoms.  MEDICAL HISTORY:  Past Medical History:  Diagnosis Date  . Acute respiratory failure with hypoxia ([place]) 12/2018  . Anemia, unspecified   . CHF (congestive heart failure) ([place])   . CML (chronic myelocytic leukemia) ([place])   . Diabetes mellitus without complication ([place])   . Esophageal reflux   . Hyperlipemia   . Hypersensitivity pneumonitis ([place]) 12/19/2017  . Hypertension   . ILD (interstitial lung disease) ([place]) 12/24/2018    SURGICAL HISTORY: Past Surgical History:  Procedure Laterality Date  . ABDOMINAL HYSTERECTOMY    . INTRAMEDULLARY (IM) NAIL INTERTROCHANTERIC Right 04/30/2020   Procedure: INTRAMEDULLARY (IM) NAIL INTERTROCHANTRIC;  Surgeon: Shona Needles, MD;  Location: [facility];  Service: Orthopedics;  Laterality: Right;  Marland Kitchen VIDEO BRONCHOSCOPY Bilateral 04/02/2019   Procedure: VIDEO BRONCHOSCOPY WITH FLUORO;  Surgeon: Marshell Garfinkel, MD;  Location: [facility];  Service: Cardiopulmonary;  Laterality: Bilateral;    SOCIAL HISTORY: Social History   Socioeconomic History  . Marital status: Married    Spouse name: Not on file  . Number of children: Not on file  . Years of education: Not on file  . Highest education level: Not on file  Occupational History  . Not on file  Tobacco Use  . Smoking status: Never Smoker  . Smokeless tobacco: Never Used  Vaping Use  . Vaping Use: Never used  Substance and Sexual Activity  . Alcohol use: Never  . Drug use: Never  . Sexual activity: Not on file  Other Topics Concern  . Not on file  Social History Narrative  . Not on file   Social Determinants of Health   Financial Resource Strain: Not on file  Food  Insecurity: Not on file  Transportation Needs: Not on file  Physical Activity: Not on file  Stress: Not on file  Social Connections: Not on file  Intimate Partner Violence: Not on file    FAMILY HISTORY: Family History  Problem Relation Age of Onset  . Diabetes Other   . Hypertension Other     ALLERGIES:  is allergic to other, ativan [lorazepam], iodine, iodine i 131 tositumomab, merbromin, and tape.  MEDICATIONS:  Current Outpatient Medications  Medication Sig Dispense Refill  . ondansetron (ZOFRAN) 8 MG tablet Take 1 tablet (8 mg total) by mouth every 8 (eight) hours as needed for nausea. 30 tablet 3  . ACCU-CHEK GUIDE test strip TEST UP TO 4 TIMES A DAY 100 strip 11  . Accu-Chek Softclix Lancets lancets TEST UP TO 4 TIMES A DAY  100 each 1  . acetaminophen (TYLENOL) 500 MG tablet Take 1,000 mg by mouth at bedtime.    Marland Kitchen albuterol (VENTOLIN HFA) 108 (90 Base) MCG/ACT inhaler INHALE 1-2 PUFFS BY MOUTH EVERY 6 HOURS AS NEEDED FOR WHEEZE OR SHORTNESS OF BREATH 8.5 each 1  . BD PEN NEEDLE NANO 2ND GEN 32G X 4 MM MISC USE WITH LANTUS'    . blood glucose meter kit and supplies KIT Dispense based on patient and insurance preference. Use up to four times daily as directed. (FOR ICD-9 250.00, 250.01). For QAC - HS accuchecks. 1 each 1  . CVS TUSSIN DM 200-20 MG/10ML liquid Take 5 mLs by mouth every 4 (four) hours as needed.    . D-5000 125 MCG (5000 UT) TABS Take 1 tablet by mouth daily.    . fluticasone (FLONASE) 50 MCG/ACT nasal spray PLACE 2 SPRAYS INTO BOTH NOSTRILS DAILY AS NEEDED FOR ALLERGIES OR RHINITIS. 18.2 mL 1  . furosemide (LASIX) 20 MG tablet Take 2 tablets (40 mg total) by mouth daily. 180 tablet 3  . HYDROcodone-acetaminophen (NORCO) 10-325 MG tablet Take 1 tablet by mouth every 6 (six) hours as needed. 30 tablet 0  . LANTUS SOLOSTAR 100 UNIT/ML Solostar Pen SMARTSIG:10 Unit(s) SUB-Q Daily    . linaclotide (LINZESS) 145 MCG CAPS capsule Take 1 capsule (145 mcg total) by mouth  daily before breakfast. 90 capsule 1  . metFORMIN (GLUCOPHAGE) 1000 MG tablet Take 1 tablet by mouth 2 (two) times daily.    . metoprolol succinate (TOPROL-XL) 25 MG 24 hr tablet Take 1 tablet (25 mg total) by mouth daily. 90 tablet 1  . Multiple Vitamin (MULTIVITAMIN WITH MINERALS) TABS tablet Take 1 tablet by mouth daily.    Marland Kitchen olopatadine (PATANOL) 0.1 % ophthalmic solution Place 1 drop into both eyes 2 (two) times daily. 5 mL 0  . OXYGEN Inhale 2-3 L into the lungs See admin instructions. Takes 2 L during day time if needed and 3 L at night    . pregabalin (LYRICA) 100 MG capsule Take 100 mg by mouth at bedtime.    . senna-docusate (SENOKOT-S) 8.6-50 MG tablet Take 1 tablet by mouth 2 (two) times daily. 60 tablet 2  . sertraline (ZOLOFT) 100 MG tablet TAKE 1 TABLET BY MOUTH AT BEDTIME 90 tablet 0  . simethicone (MYLICON) 80 MG chewable tablet Chew 1 tablet (80 mg total) by mouth 4 (four) times daily as needed for flatulence.    Marland Kitchen spironolactone (ALDACTONE) 25 MG tablet Take 0.5 tablets (12.5 mg total) by mouth daily. 45 tablet 3  . triamcinolone cream (KENALOG) 0.1 % Apply topically every 6 (six) hours as needed.     No current facility-administered medications for this visit.   Facility-Administered Medications Ordered in Other Visits  Medication Dose Route Frequency Provider Last Rate Last Admin  . furosemide (LASIX) injection 20 mg  20 mg Intravenous Once Brunetta Genera, MD        REVIEW OF SYSTEMS:   10 Point review of Systems was done is negative except as noted above.  PHYSICAL EXAMINATION: ECOG PERFORMANCE STATUS: 2 - Symptomatic, <50% confined to bed  VS reviewed  Exam was given in infusion.   GENERAL:alert, in no acute distress and comfortable SKIN: no acute rashes, no significant lesions EYES: conjunctiva are pink and non-injected, sclera anicteric OROPHARYNX: MMM, no exudates, no oropharyngeal erythema or ulceration NECK: supple, no JVD LYMPH:  no palpable  lymphadenopathy in the cervical, axillary or inguinal regions LUNGS:  clear to auscultation b/l with normal respiratory effort HEART: regular rate & rhythm ABDOMEN:  normoactive bowel sounds , non tender, not distended. Extremity: pedal edema improved from last visit PSYCH: alert & oriented x 3 with fluent speech NEURO: no focal motor/sensory deficits  LABORATORY DATA:  I have reviewed the data as listed  . CBC Latest Ref Rng & Units 09/15/2020 09/14/2020 09/07/2020  WBC 4.0 - 10.5 K/uL 10.0 6.4 9.4  Hemoglobin 12.0 - 15.0 g/dL 6.7(LL) 6.6(LL) 7.4(L)  Hematocrit 36.0 - 46.0 % 21.4(L) 21.6(L) 23.8(L)  Platelets 150 - 400 K/uL 37(L) 27(LL) 28(L)    . CMP Latest Ref Rng & Units 09/15/2020 09/14/2020 09/03/2020  Glucose 70 - 99 mg/dL 183(H) 103(H) 357(H)  BUN 8 - 23 mg/dL 34(H) 30(H) 18  Creatinine 0.44 - 1.00 mg/dL 0.80 0.96 0.65  Sodium 135 - 145 mmol/L 137 136 137  Potassium 3.5 - 5.1 mmol/L 5.4(H) 5.3(H) 4.1  Chloride 98 - 111 mmol/L 107 105 102  CO2 22 - 32 mmol/L 18(L) 24 27  Calcium 8.9 - 10.3 mg/dL 9.8 9.6 9.1  Total Protein 6.5 - 8.1 g/dL 6.3(L) - -  Total Bilirubin 0.3 - 1.2 mg/dL 1.4(H) - -  Alkaline Phos 38 - 126 U/L 130(H) - -  AST 15 - 41 U/L 14(L) - -  ALT 0 - 44 U/L <6 - -   01/21/2019 FISH:    RADIOGRAPHIC STUDIES: I have personally reviewed the radiological images as listed and agreed with the findings in the report. DG Chest 2 View  Result Date: 09/03/2020 CLINICAL DATA:  70 year old female with history of productive cough for 1 week. EXAM: CHEST - 2 VIEW COMPARISON:  Chest x-ray 07/14/2020. FINDINGS: Lung volumes are low. Areas of interstitial prominence and patchy ill-defined opacities are noted throughout the mid to lower lungs bilaterally. No pneumothorax. No definite suspicious appearing pulmonary nodules or masses are noted. No evidence of pulmonary edema. Heart size is mildly enlarged. Upper mediastinal contours are within normal limits allowing for patient's  low lung volumes. Aortic atherosclerosis. IMPRESSION: 1. The appearance the chest is concerning for multilobar bilateral pneumonia. Clinical correlation for signs and symptoms of viral infection is recommended. 2. Aortic atherosclerosis. 3. Mild cardiomegaly. Electronically Signed   By: Vinnie Langton M.D.   On: 09/03/2020 09:53   ECHOCARDIOGRAM COMPLETE  Result Date: 09/14/2020    ECHOCARDIOGRAM REPORT   Patient Name:   Kathy Irwin Date of Exam: 09/14/2020 Medical Rec #:  [id number]     Height:       64.0 in Accession #:    [id number]    Weight:       224.2 lb Date of Birth:  [birth year]      BSA:          2.054 m Patient Age:    17 years      BP:           132/62 mmHg Patient Gender: F             HR:           90 bpm. Exam Location:  Outpatient Procedure: 2D Echo, Cardiac Doppler and Color Doppler Indications:    CHF  History:        Patient has prior history of Echocardiogram examinations, most                 recent 08/06/2019. CHF; Risk Factors:Hypertension, Diabetes and  Morbid obesity. Lung disease, Covid, leukemia.  Sonographer:    Dustin Flock Referring Phys: [place]  1. Left ventricular ejection fraction, by estimation, is 65 to 70%. The left ventricle has hyperdynamic function. The left ventricle has no regional wall motion abnormalities. There is mild left ventricular hypertrophy. Left ventricular diastolic parameters are consistent with Grade II diastolic dysfunction (pseudonormalization).  2. Right ventricular systolic function is mildly reduced. The right ventricular size is normal. There is severely elevated pulmonary artery systolic pressure. The estimated right ventricular systolic pressure is 63.3 mmHg.  3. The mitral valve is normal in structure. Trivial mitral valve regurgitation. No evidence of mitral stenosis.  4. The aortic valve is tricuspid. Aortic valve regurgitation is not visualized. Mild to moderate aortic valve sclerosis/calcification is  present, without any evidence of aortic stenosis (mean gradient elevated 11 mmHg but suspect due to high flow, valve  opens well).  5. The inferior vena cava is normal in size with greater than 50% respiratory variability, suggesting right atrial pressure of 3 mmHg. FINDINGS  Left Ventricle: Left ventricular ejection fraction, by estimation, is 65 to 70%. The left ventricle has hyperdynamic function. The left ventricle has no regional wall motion abnormalities. The left ventricular internal cavity size was normal in size. There is mild left ventricular hypertrophy. Left ventricular diastolic parameters are consistent with Grade II diastolic dysfunction (pseudonormalization). Right Ventricle: The right ventricular size is normal. No increase in right ventricular wall thickness. Right ventricular systolic function is mildly reduced. There is severely elevated pulmonary artery systolic pressure. The tricuspid regurgitant velocity is 4.08 m/s, and with an assumed right atrial pressure of 3 mmHg, the estimated right ventricular systolic pressure is 35.4 mmHg. Left Atrium: Left atrial size was normal in size. Right Atrium: Right atrial size was normal in size. Pericardium: There is no evidence of pericardial effusion. Mitral Valve: The mitral valve is normal in structure. There is mild calcification of the mitral valve leaflet(s). Mild mitral annular calcification. Trivial mitral valve regurgitation. No evidence of mitral valve stenosis. Tricuspid Valve: The tricuspid valve is normal in structure. Tricuspid valve regurgitation is mild. Aortic Valve: The aortic valve is tricuspid. Aortic valve regurgitation is not visualized. Mild to moderate aortic valve sclerosis/calcification is present, without any evidence of aortic stenosis. Aortic valve mean gradient measures 11.0 mmHg. Aortic valve peak gradient measures 20.9 mmHg. Aortic valve area, by VTI measures 2.14 cm. Pulmonic Valve: The pulmonic valve was normal in  structure. Pulmonic valve regurgitation is not visualized. Aorta: The aortic root is normal in size and structure. Venous: The inferior vena cava is normal in size with greater than 50% respiratory variability, suggesting right atrial pressure of 3 mmHg. IAS/Shunts: No atrial level shunt detected by color flow Doppler.  LEFT VENTRICLE PLAX 2D LVIDd:         5.00 cm     Diastology LVIDs:         2.70 cm     LV e' medial:    5.77 cm/s LV PW:         1.20 cm     LV E/e' medial:  14.2 LV IVS:        1.20 cm     LV e' lateral:   5.87 cm/s LVOT diam:     2.30 cm     LV E/e' lateral: 14.0 LV SV:         103 LV SV Index:   50 LVOT Area:     4.15  cm  LV Volumes (MOD) LV vol d, MOD A4C: 97.8 ml LV vol s, MOD A4C: 23.3 ml LV SV MOD A4C:     97.8 ml RIGHT VENTRICLE RV Basal diam:  2.90 cm RV S prime:     8.05 cm/s TAPSE (M-mode): 2.5 cm LEFT ATRIUM             Index       RIGHT ATRIUM           Index LA diam:        3.70 cm 1.80 cm/m  RA Area:     12.80 cm LA Vol (A2C):   43.4 ml 21.13 ml/m RA Volume:   27.70 ml  13.49 ml/m LA Vol (A4C):   33.5 ml 16.31 ml/m LA Biplane Vol: 38.8 ml 18.89 ml/m  AORTIC VALVE AV Area (Vmax):    2.07 cm AV Area (Vmean):   2.07 cm AV Area (VTI):     2.14 cm AV Vmax:           228.50 cm/s AV Vmean:          153.000 cm/s AV VTI:            0.482 m AV Peak Grad:      20.9 mmHg AV Mean Grad:      11.0 mmHg LVOT Vmax:         114.00 cm/s LVOT Vmean:        76.200 cm/s LVOT VTI:          0.248 m LVOT/AV VTI ratio: 0.51  AORTA Ao Root diam: 2.60 cm MITRAL VALVE               TRICUSPID VALVE MV Area (PHT): 5.38 cm    TV Peak grad:   66.6 mmHg MV Decel Time: 141 msec    TV Vmax:        4.08 m/s MV E velocity: 82.00 cm/s  TR Peak grad:   66.6 mmHg MV A velocity: 74.40 cm/s  TR Vmax:        408.00 cm/s MV E/A ratio:  1.10                            SHUNTS                            Systemic VTI:  0.25 m                            Systemic Diam: 2.30 cm Loralie Champagne MD Electronically signed by  Loralie Champagne MD Signature Date/Time: 09/14/2020/11:21:23 AM    Final     ASSESSMENT & PLAN:   1) CMML-1 2)Thrombocytopenia-- likely multifactorial.  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 ITP is likely since platelets improved significantly with recent steroid use. Additional factors worsening her platelet count are infection in her right hip surgical site with drainage and absorption of platelets, multiple lines of antibiotics etc. 3) MRSA UTI - Currently on Nitrofurantoin. Okay to consider Linezolid for short course if necessary from hematologic standpoint.  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 Plt improved to 37K. Other labs and chemistries stable. -Recommended pt avoid electronics and screens prior to bed time to help with sleeping.  -  Recommended pt attempt to eat smaller, more frequent meals and drink enough water. (likely gastroparesis) -Will rx Zofran for use at home for nausea. -Advised pt that her PCP is supposed to coordinate cares and all regular medications such as diabetes medications and other medicines. -Advised pt her next scheduled treatment is on Monday, 06/06. Will continue as long as it is working and pt can handle it well. -Will continue to monitor blood counts every 1-2 weeks for any transfusion support needs. -Continue to f/u w orthopaedics, infectious disease, and Pulmonologist. -Continue nPlate q1week. Advised pt that even at a high dose, the Plt are not responding very well. -Will see back with [place] with labs.   FOLLOW UP: Patient to follow-up for cycle 2 of the Vidaza as scheduled. Labs on cycle 3-day 1. Labs and 1 unit of PRBC and Nplate weekly x6 Clinic visit with Murray Hodgkins for toxicity check cycle 2-day 14. Please schedule cycle 3 of Vidaza as per orders.  Labs with cycle 3-day 1. MD visit with Dr. Irene Limbo with cycle 3-day 1 of Vidaza. Referral for  [facility]    The total time spent in the appointment was 30 minutes and more than 50% was on counseling and direct patient cares, orderinf and mx of chemotherapy and PRBC transfusions   All of the patient's questions were answered with apparent satisfaction. The patient knows to call the clinic with any problems, questions or concerns.   Sullivan Lone MD [place] AAHIVMS [facility] Hematology/Oncology Physician [facility]  (Office):       [phone] (Work cell):  [phone] (Fax):           [phone]  09/15/2020 10:30 AM  I, Reinaldo Raddle, am acting as scribe for Dr. Sullivan Lone, MD.  .I have reviewed the above documentation for accuracy and completeness, and I agree with the above. Brunetta Genera MD

## 2020-09-14 NOTE — Telephone Encounter (Signed)
Moved 6/1 appt to earlier time per [facility]. Called and spoke with patients sister. Confirmed new time.

## 2020-09-14 NOTE — Telephone Encounter (Signed)
Patient is followed by cardiology Dr. Kirk Ruths. Contacted him prior to refilling her metoprolol since pricribe by PCP . He was in agreement. Ask nurse if heart rate continues to be > 100 to call cardiologist. But once  or twice a week we will monitor.

## 2020-09-15 ENCOUNTER — Inpatient Hospital Stay: Payer: 59

## 2020-09-15 ENCOUNTER — Inpatient Hospital Stay: Payer: 59 | Admitting: Hematology

## 2020-09-15 ENCOUNTER — Other Ambulatory Visit: Payer: Self-pay

## 2020-09-15 ENCOUNTER — Inpatient Hospital Stay (HOSPITAL_BASED_OUTPATIENT_CLINIC_OR_DEPARTMENT_OTHER): Payer: 59 | Admitting: Hematology

## 2020-09-15 ENCOUNTER — Ambulatory Visit: Payer: 59

## 2020-09-15 ENCOUNTER — Inpatient Hospital Stay: Payer: 59 | Attending: Hematology

## 2020-09-15 DIAGNOSIS — E119 Type 2 diabetes mellitus without complications: Secondary | ICD-10-CM | POA: Insufficient documentation

## 2020-09-15 DIAGNOSIS — D649 Anemia, unspecified: Secondary | ICD-10-CM

## 2020-09-15 DIAGNOSIS — C9310 Chronic myelomonocytic leukemia not having achieved remission: Secondary | ICD-10-CM

## 2020-09-15 DIAGNOSIS — D630 Anemia in neoplastic disease: Secondary | ICD-10-CM | POA: Diagnosis not present

## 2020-09-15 DIAGNOSIS — N390 Urinary tract infection, site not specified: Secondary | ICD-10-CM | POA: Diagnosis not present

## 2020-09-15 DIAGNOSIS — B9562 Methicillin resistant Staphylococcus aureus infection as the cause of diseases classified elsewhere: Secondary | ICD-10-CM | POA: Diagnosis not present

## 2020-09-15 DIAGNOSIS — E785 Hyperlipidemia, unspecified: Secondary | ICD-10-CM | POA: Diagnosis not present

## 2020-09-15 DIAGNOSIS — Z5111 Encounter for antineoplastic chemotherapy: Secondary | ICD-10-CM | POA: Insufficient documentation

## 2020-09-15 DIAGNOSIS — I10 Essential (primary) hypertension: Secondary | ICD-10-CM | POA: Insufficient documentation

## 2020-09-15 DIAGNOSIS — D693 Immune thrombocytopenic purpura: Secondary | ICD-10-CM

## 2020-09-15 DIAGNOSIS — R110 Nausea: Secondary | ICD-10-CM | POA: Diagnosis not present

## 2020-09-15 LAB — CBC WITH DIFFERENTIAL (CANCER CENTER ONLY)
Abs Immature Granulocytes: 0 10*3/uL (ref 0.00–0.07)
Basophils Absolute: 0.1 10*3/uL (ref 0.0–0.1)
Basophils Relative: 1 %
Eosinophils Absolute: 0 10*3/uL (ref 0.0–0.5)
Eosinophils Relative: 0 %
HCT: 21.4 % — ABNORMAL LOW (ref 36.0–46.0)
Hemoglobin: 6.7 g/dL — CL (ref 12.0–15.0)
Immature Granulocytes: 0 %
Lymphocytes Relative: 49 %
Lymphs Abs: 5 10*3/uL — ABNORMAL HIGH (ref 0.7–4.0)
MCH: 29.4 pg (ref 26.0–34.0)
MCHC: 31.3 g/dL (ref 30.0–36.0)
MCV: 93.9 fL (ref 80.0–100.0)
Monocytes Absolute: 2.3 10*3/uL — ABNORMAL HIGH (ref 0.1–1.0)
Monocytes Relative: 23 %
Neutro Abs: 2.7 10*3/uL (ref 1.7–7.7)
Neutrophils Relative %: 27 %
Platelet Count: 37 10*3/uL — ABNORMAL LOW (ref 150–400)
RBC: 2.28 MIL/uL — ABNORMAL LOW (ref 3.87–5.11)
RDW: 21.4 % — ABNORMAL HIGH (ref 11.5–15.5)
WBC Count: 10 10*3/uL (ref 4.0–10.5)
nRBC: 16.1 % — ABNORMAL HIGH (ref 0.0–0.2)

## 2020-09-15 LAB — PREPARE RBC (CROSSMATCH)

## 2020-09-15 LAB — CMP (CANCER CENTER ONLY)
ALT: <6 U/L (ref 0–44)
AST: 14 U/L — ABNORMAL LOW (ref 15–41)
Albumin: 2.8 g/dL — ABNORMAL LOW (ref 3.5–5.0)
Alkaline Phosphatase: 130 U/L — ABNORMAL HIGH (ref 38–126)
Anion gap: 12 (ref 5–15)
BUN: 34 mg/dL — ABNORMAL HIGH (ref 8–23)
CO2: 18 mmol/L — ABNORMAL LOW (ref 22–32)
Calcium: 9.8 mg/dL (ref 8.9–10.3)
Chloride: 107 mmol/L (ref 98–111)
Creatinine: 0.8 mg/dL (ref 0.44–1.00)
GFR, Estimated: >60 mL/min (ref >60)
Glucose, Bld: 183 mg/dL — ABNORMAL HIGH (ref 70–99)
Potassium: 5.4 mmol/L — ABNORMAL HIGH (ref 3.5–5.1)
Sodium: 137 mmol/L (ref 135–145)
Total Bilirubin: 1.4 mg/dL — ABNORMAL HIGH (ref 0.3–1.2)
Total Protein: 6.3 g/dL — ABNORMAL LOW (ref 6.5–8.1)

## 2020-09-15 LAB — SAMPLE TO BLOOD BANK

## 2020-09-15 MED ORDER — DIPHENHYDRAMINE HCL 25 MG PO CAPS
25.0000 mg | ORAL_CAPSULE | Freq: Once | ORAL | Status: AC
  Administered 2020-09-15: 25 mg via ORAL

## 2020-09-15 MED ORDER — ONDANSETRON HCL 8 MG PO TABS: 8.0000 mg | ORAL_TABLET | Freq: Three times a day (TID) | ORAL | 3 refills | Status: DC | PRN

## 2020-09-15 MED ORDER — DIPHENHYDRAMINE HCL 25 MG PO CAPS
ORAL_CAPSULE | ORAL | Status: AC
  Filled 2020-09-15: qty 1

## 2020-09-15 MED ORDER — ONDANSETRON HCL 8 MG PO TABS
ORAL_TABLET | ORAL | Status: AC
  Filled 2020-09-15: qty 1

## 2020-09-15 MED ORDER — ACETAMINOPHEN 325 MG PO TABS
650.0000 mg | ORAL_TABLET | Freq: Once | ORAL | Status: AC
  Administered 2020-09-15: 650 mg via ORAL

## 2020-09-15 MED ORDER — METHYLPREDNISOLONE SODIUM SUCC 40 MG IJ SOLR
INTRAMUSCULAR | Status: AC
  Filled 2020-09-15: qty 1

## 2020-09-15 MED ORDER — FUROSEMIDE 10 MG/ML IJ SOLN
20.0000 mg | Freq: Once | INTRAMUSCULAR | Status: AC
  Administered 2020-09-15: 20 mg via INTRAVENOUS

## 2020-09-15 MED ORDER — ONDANSETRON HCL 8 MG PO TABS
8.0000 mg | ORAL_TABLET | Freq: Once | ORAL | Status: AC
  Administered 2020-09-15: 8 mg via ORAL

## 2020-09-15 MED ORDER — METHYLPREDNISOLONE SODIUM SUCC 40 MG IJ SOLR
40.0000 mg | Freq: Once | INTRAMUSCULAR | Status: AC
  Administered 2020-09-15: 40 mg via INTRAVENOUS

## 2020-09-15 MED ORDER — FUROSEMIDE 10 MG/ML IJ SOLN
INTRAMUSCULAR | Status: AC
  Filled 2020-09-15: qty 2

## 2020-09-15 MED ORDER — ACETAMINOPHEN 325 MG PO TABS
ORAL_TABLET | ORAL | Status: AC
  Filled 2020-09-15: qty 2

## 2020-09-15 MED ORDER — SODIUM CHLORIDE 0.9% IV SOLUTION
250.0000 mL | Freq: Once | INTRAVENOUS | Status: AC
  Administered 2020-09-15: 250 mL via INTRAVENOUS
  Filled 2020-09-15: qty 250

## 2020-09-15 NOTE — H&P (View-Only) (Signed)
PCP: Kerin Perna, NP Cardiology: Dr. Claiborne Billings HF Cardiology: Dr. Aundra Dubin  70 y.o. with history of CMML, chronic ITP, HTN, type 2 diabetes, and chronic diastolic CHF was referred by pulmonary (Dr. Vaughan Browner) for evaluation of diastolic CHF.  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  She has been on home oxygen for > 10 years.  She is followed by Dr. Irene Limbo for CMML and ITP and is getting chemotherapy with Vidaza.  Platelets have been very low, most recently 18K.  She fell and fractured her right hip in 1/22, s/p surgical repair that was complicated by infection.  She was admitted in 3/22 with thrombocytopenia, anemia, and CHF.   She has had diastolic CHF and has been on Lasix.  Echo in 4/21 showed EF 70-75%,  mild RV enlargement with normal systolic function, PASP 51 mmHg, IVC normal.  Echo was done today and reviewed, EF 65-70% with grade 2 diastolic dysfunction, mild RV dysfunction with PASP 70 mmHg, mean aortic valve gradient 11 mmHg but think no stenosis (suspect due to high flow).   She returns for followup of dyspnea, diastolic CHF.  At last appointment, Lasix was increased.  Weight up 1 lb.   She is still very short of breath, including just walking around her house. Symptoms worse for the last few days.  She continues to walk with a walker.  +Orthopnea, has to elevated head of bed at night. She continues to use home oxygen.   REDS clip 35%  Labs (5/22): K 4.9 => 4.1, creatinine 0.82 => 0.65,  hgb 7.9 => 7.4, plts 18 => 28, BNP 184  PMH: 1. COVID-19 8/21 2. Depression 3. 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  5. Chronic ITP 6. HTN 7. Type 2 diabetes 8. Thyroid nodule 9. GERD 10. Interstitial lung disease: Hypersensitivity pneumonitis.  Has seen Dr. Vaughan Browner.  - Home oxygen > 10 years.  11. Sleep study was negative.  12. Right hip fracture in 1/22, treated with intramedullary nail and complicated by  surgical site infection.  13. Chronic diastolic CHF: Echo in 6/75 with EF 70-75%, mild RV enlargement with normal systolic function, PASP 51 mmHg, IVC normal.  - Echo (5/22): EF 65-70% with grade 2 diastolic dysfunction, mild RV dysfunction with PASP 70 mmHg, mean aortic valve gradient 11 mmHg but think no stenosis (suspect due to high flow).   Social History   Socioeconomic History  . Marital status: Married    Spouse name: Not on file  . Number of children: Not on file  . Years of education: Not on file  . Highest education level: Not on file  Occupational History  . Not on file  Tobacco Use  . Smoking status: Never Smoker  . Smokeless tobacco: Never Used  Vaping Use  . Vaping Use: Never used  Substance and Sexual Activity  . Alcohol use: Never  . Drug use: Never  . Sexual activity: Not on file  Other Topics Concern  . Not on file  Social History Narrative  . Not on file   Social Determinants of Health   Financial Resource Strain: Not on file  Food Insecurity: Not on file  Transportation Needs: Not on file  Physical Activity: Not on file  Stress: Not on file  Social Connections: Not on file  Intimate Partner Violence: Not on file   Family History  Problem Relation Age of Onset  . Diabetes Other   . Hypertension Other  ROS: All systems reviewed and negative except as per HPI.   Current Outpatient Medications  Medication Sig Dispense Refill  . ACCU-CHEK GUIDE test strip TEST UP TO 4 TIMES A DAY 100 strip 11  . Accu-Chek Softclix Lancets lancets TEST UP TO 4 TIMES A DAY 100 each 1  . acetaminophen (TYLENOL) 500 MG tablet Take 1,000 mg by mouth at bedtime.    . albuterol (VENTOLIN HFA) 108 (90 Base) MCG/ACT inhaler INHALE 1-2 PUFFS BY MOUTH EVERY 6 HOURS AS NEEDED FOR WHEEZE OR SHORTNESS OF BREATH 8.5 each 1  . BD PEN NEEDLE NANO 2ND GEN 32G X 4 MM MISC USE WITH LANTUS'    . blood glucose meter kit and supplies KIT Dispense based on patient and insurance  preference. Use up to four times daily as directed. (FOR ICD-9 250.00, 250.01). For QAC - HS accuchecks. 1 each 1  . [employer] TUSSIN DM 200-20 MG/10ML liquid Take 5 mLs by mouth every 4 (four) hours as needed.    . D-5000 125 MCG (5000 UT) TABS Take 1 tablet by mouth daily.    . fluticasone (FLONASE) 50 MCG/ACT nasal spray PLACE 2 SPRAYS INTO BOTH NOSTRILS DAILY AS NEEDED FOR ALLERGIES OR RHINITIS. 18.2 mL 1  . furosemide (LASIX) 20 MG tablet Take 2 tablets (40 mg total) by mouth daily. 180 tablet 3  . HYDROcodone-acetaminophen (NORCO) 10-325 MG tablet Take 1 tablet by mouth every 6 (six) hours as needed. 30 tablet 0  . LANTUS SOLOSTAR 100 UNIT/ML Solostar Pen SMARTSIG:10 Unit(s) SUB-Q Daily    . linaclotide (LINZESS) 145 MCG CAPS capsule Take 1 capsule (145 mcg total) by mouth daily before breakfast. 90 capsule 1  . metoprolol succinate (TOPROL-XL) 25 MG 24 hr tablet Take 1 tablet (25 mg total) by mouth daily. 90 tablet 1  . Multiple Vitamin (MULTIVITAMIN WITH MINERALS) TABS tablet Take 1 tablet by mouth daily.    . olopatadine (PATANOL) 0.1 % ophthalmic solution Place 1 drop into both eyes 2 (two) times daily. 5 mL 0  . OXYGEN Inhale 2-3 L into the lungs See admin instructions. Takes 2 L during day time if needed and 3 L at night    . pregabalin (LYRICA) 100 MG capsule Take 100 mg by mouth at bedtime.    . senna-docusate (SENOKOT-S) 8.6-50 MG tablet Take 1 tablet by mouth 2 (two) times daily. 60 tablet 2  . sertraline (ZOLOFT) 100 MG tablet TAKE 1 TABLET BY MOUTH AT BEDTIME 90 tablet 0  . simethicone (MYLICON) 80 MG chewable tablet Chew 1 tablet (80 mg total) by mouth 4 (four) times daily as needed for flatulence.    . spironolactone (ALDACTONE) 25 MG tablet Take 0.5 tablets (12.5 mg total) by mouth daily. 45 tablet 3  . triamcinolone cream (KENALOG) 0.1 % Apply topically every 6 (six) hours as needed.    . metFORMIN (GLUCOPHAGE) 1000 MG tablet Take 1 tablet by mouth 2 (two) times daily.     No  current facility-administered medications for this encounter.   BP (!) 94/50   Pulse 73   Wt 102.1 kg (225 lb)   SpO2 100% Comment: 3L n/c  BMI 38.62 kg/m  General: NAD Neck: JVP 8 cm with HJR, no thyromegaly or thyroid nodule.  Lungs: Mild crackles at bases.  CV: Nondisplaced PMI.  Heart regular S1/S2, no S3/S4, no murmur.  1+ edema to knees.  No carotid bruit.  Normal pedal pulses.  Abdomen: Soft, nontender, no hepatosplenomegaly, no distention.  Skin:   Intact without lesions or rashes.  Neurologic: Alert and oriented x 3.  Psych: Normal affect. Extremities: No clubbing or cyanosis.  HEENT: Normal.   Assessment/Plan: 1. Chronic diastolic CHF: Echo in 3/31 showed EF 70-75%,  mild RV enlargement with normal systolic function, PASP 51 mmHg, IVC normal.  Echo was done today and reviewed, EF 65-70% with grade 2 diastolic dysfunction, mild RV dysfunction with PASP 70 mmHg, mean aortic valve gradient 11 mmHg but think no stenosis (suspect due to high flow). On exam, she is still volume overloaded.  She still has NYHA class IIIb symptoms.  REDS clip is borderline elevated.  - Increase Lasix to 40 mg bid x 2 days then 40 qam/20 qpm.  BMET/BNP today and in 10 days.  - Continue spironolactone 12.5 mg daily.  - Arrange for RHC (see below).   - I will have her wear graded compression stockings.  2. Pulmonary hypertension: PASP 51 mmHg on 4/21 echo with RV enlargement, PASP 70 mmHg on 5/22 echo with mildly decreased RV function.  Possibly, there is a component of group 3 PH in the setting of interstitial lung disease (PH-ILD).  Alternatively, this could be primarily group 2 PH due to diastolic CHF.  Ultimately, she needs RHC to make this determination.  If she has PH-ILD, she would likely be a candidate for Tyvaso.  - I will arrange for RHC.  Think if platelets continue to rise, we can go ahead with RHC as long as brachial IV can be used. We discussed risks/benefits of procedure and she agrees (plan for  later this week).   3. ILD: Due to hypersensitivity pneumonitis.  - Needs pulmonary followup.  4. Chronic ITP: Platelets trending up at 28K.  - CBC today.   Followup with me in 6 weeks.   Loralie Champagne 09/15/2020

## 2020-09-15 NOTE — Progress Notes (Unsigned)
CRITICAL VALUE STICKER  CRITICAL VALUE: Hgb 6.7  DATE & TIME NOTIFIED: 09/15/20 0850 am  MD NOTIFIED: Dr Irene Limbo  TIME OF NOTIFICATION: 0915 am

## 2020-09-15 NOTE — Progress Notes (Signed)
PCP: Kerin Perna, NP Cardiology: Dr. Claiborne Billings HF Cardiology: Dr. Aundra Dubin  70 y.o. with history of CMML, chronic ITP, HTN, type 2 diabetes, and chronic diastolic CHF was referred by pulmonary (Dr. Vaughan Browner) for evaluation of diastolic CHF.  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  She has been on home oxygen for > 10 years.  She is followed by Dr. Irene Limbo for CMML and ITP and is getting chemotherapy with Vidaza.  Platelets have been very low, most recently 18K.  She fell and fractured her right hip in 1/22, s/p surgical repair that was complicated by infection.  She was admitted in 3/22 with thrombocytopenia, anemia, and CHF.   She has had diastolic CHF and has been on Lasix.  Echo in 4/21 showed EF 70-75%,  mild RV enlargement with normal systolic function, PASP 51 mmHg, IVC normal.  Echo was done today and reviewed, EF 65-70% with grade 2 diastolic dysfunction, mild RV dysfunction with PASP 70 mmHg, mean aortic valve gradient 11 mmHg but think no stenosis (suspect due to high flow).   She returns for followup of dyspnea, diastolic CHF.  At last appointment, Lasix was increased.  Weight up 1 lb.   She is still very short of breath, including just walking around her house. Symptoms worse for the last few days.  She continues to walk with a walker.  +Orthopnea, has to elevated head of bed at night. She continues to use home oxygen.   REDS clip 35%  Labs (5/22): K 4.9 => 4.1, creatinine 0.82 => 0.65,  hgb 7.9 => 7.4, plts 18 => 28, BNP 184  PMH: 1. COVID-19 8/21 2. Depression 3. 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  5. Chronic ITP 6. HTN 7. Type 2 diabetes 8. Thyroid nodule 9. GERD 10. Interstitial lung disease: Hypersensitivity pneumonitis.  Has seen Dr. Vaughan Browner.  - Home oxygen > 10 years.  11. Sleep study was negative.  12. Right hip fracture in 1/22, treated with intramedullary nail and complicated by  surgical site infection.  13. Chronic diastolic CHF: Echo in 6/75 with EF 70-75%, mild RV enlargement with normal systolic function, PASP 51 mmHg, IVC normal.  - Echo (5/22): EF 65-70% with grade 2 diastolic dysfunction, mild RV dysfunction with PASP 70 mmHg, mean aortic valve gradient 11 mmHg but think no stenosis (suspect due to high flow).   Social History   Socioeconomic History  . Marital status: Married    Spouse name: Not on file  . Number of children: Not on file  . Years of education: Not on file  . Highest education level: Not on file  Occupational History  . Not on file  Tobacco Use  . Smoking status: Never Smoker  . Smokeless tobacco: Never Used  Vaping Use  . Vaping Use: Never used  Substance and Sexual Activity  . Alcohol use: Never  . Drug use: Never  . Sexual activity: Not on file  Other Topics Concern  . Not on file  Social History Narrative  . Not on file   Social Determinants of Health   Financial Resource Strain: Not on file  Food Insecurity: Not on file  Transportation Needs: Not on file  Physical Activity: Not on file  Stress: Not on file  Social Connections: Not on file  Intimate Partner Violence: Not on file   Family History  Problem Relation Age of Onset  . Diabetes Other   . Hypertension Other  ROS: All systems reviewed and negative except as per HPI.   Current Outpatient Medications  Medication Sig Dispense Refill  . ACCU-CHEK GUIDE test strip TEST UP TO 4 TIMES A DAY 100 strip 11  . Accu-Chek Softclix Lancets lancets TEST UP TO 4 TIMES A DAY 100 each 1  . acetaminophen (TYLENOL) 500 MG tablet Take 1,000 mg by mouth at bedtime.    Marland Kitchen albuterol (VENTOLIN HFA) 108 (90 Base) MCG/ACT inhaler INHALE 1-2 PUFFS BY MOUTH EVERY 6 HOURS AS NEEDED FOR WHEEZE OR SHORTNESS OF BREATH 8.5 each 1  . BD PEN NEEDLE NANO 2ND GEN 32G X 4 MM MISC USE WITH LANTUS'    . blood glucose meter kit and supplies KIT Dispense based on patient and insurance  preference. Use up to four times daily as directed. (FOR ICD-9 250.00, 250.01). For QAC - HS accuchecks. 1 each 1  . [facility]S TUSSIN DM 200-20 MG/10ML liquid Take 5 mLs by mouth every 4 (four) hours as needed.    . D-5000 125 MCG (5000 UT) TABS Take 1 tablet by mouth daily.    . fluticasone (FLONASE) 50 MCG/ACT nasal spray PLACE 2 SPRAYS INTO BOTH NOSTRILS DAILY AS NEEDED FOR ALLERGIES OR RHINITIS. 18.2 mL 1  . furosemide (LASIX) 20 MG tablet Take 2 tablets (40 mg total) by mouth daily. 180 tablet 3  . HYDROcodone-acetaminophen (NORCO) 10-325 MG tablet Take 1 tablet by mouth every 6 (six) hours as needed. 30 tablet 0  . LANTUS SOLOSTAR 100 UNIT/ML Solostar Pen SMARTSIG:10 Unit(s) SUB-Q Daily    . linaclotide (LINZESS) 145 MCG CAPS capsule Take 1 capsule (145 mcg total) by mouth daily before breakfast. 90 capsule 1  . metoprolol succinate (TOPROL-XL) 25 MG 24 hr tablet Take 1 tablet (25 mg total) by mouth daily. 90 tablet 1  . Multiple Vitamin (MULTIVITAMIN WITH MINERALS) TABS tablet Take 1 tablet by mouth daily.    Marland Kitchen olopatadine (PATANOL) 0.1 % ophthalmic solution Place 1 drop into both eyes 2 (two) times daily. 5 mL 0  . OXYGEN Inhale 2-3 L into the lungs See admin instructions. Takes 2 L during day time if needed and 3 L at night    . pregabalin (LYRICA) 100 MG capsule Take 100 mg by mouth at bedtime.    . senna-docusate (SENOKOT-S) 8.6-50 MG tablet Take 1 tablet by mouth 2 (two) times daily. 60 tablet 2  . sertraline (ZOLOFT) 100 MG tablet TAKE 1 TABLET BY MOUTH AT BEDTIME 90 tablet 0  . simethicone (MYLICON) 80 MG chewable tablet Chew 1 tablet (80 mg total) by mouth 4 (four) times daily as needed for flatulence.    Marland Kitchen spironolactone (ALDACTONE) 25 MG tablet Take 0.5 tablets (12.5 mg total) by mouth daily. 45 tablet 3  . triamcinolone cream (KENALOG) 0.1 % Apply topically every 6 (six) hours as needed.    . metFORMIN (GLUCOPHAGE) 1000 MG tablet Take 1 tablet by mouth 2 (two) times daily.     No  current facility-administered medications for this encounter.   BP (!) 94/50   Pulse 73   Wt 102.1 kg (225 lb)   SpO2 100% Comment: 3L n/c  BMI 38.62 kg/m  General: NAD Neck: JVP 8 cm with HJR, no thyromegaly or thyroid nodule.  Lungs: Mild crackles at bases.  CV: Nondisplaced PMI.  Heart regular S1/S2, no S3/S4, no murmur.  1+ edema to knees.  No carotid bruit.  Normal pedal pulses.  Abdomen: Soft, nontender, no hepatosplenomegaly, no distention.  Skin:  Intact without lesions or rashes.  Neurologic: Alert and oriented x 3.  Psych: Normal affect. Extremities: No clubbing or cyanosis.  HEENT: Normal.   Assessment/Plan: 1. Chronic diastolic CHF: Echo in 3/31 showed EF 70-75%,  mild RV enlargement with normal systolic function, PASP 51 mmHg, IVC normal.  Echo was done today and reviewed, EF 65-70% with grade 2 diastolic dysfunction, mild RV dysfunction with PASP 70 mmHg, mean aortic valve gradient 11 mmHg but think no stenosis (suspect due to high flow). On exam, she is still volume overloaded.  She still has NYHA class IIIb symptoms.  REDS clip is borderline elevated.  - Increase Lasix to 40 mg bid x 2 days then 40 qam/20 qpm.  BMET/BNP today and in 10 days.  - Continue spironolactone 12.5 mg daily.  - Arrange for RHC (see below).   - I will have her wear graded compression stockings.  2. Pulmonary hypertension: PASP 51 mmHg on 4/21 echo with RV enlargement, PASP 70 mmHg on 5/22 echo with mildly decreased RV function.  Possibly, there is a component of group 3 PH in the setting of interstitial lung disease (PH-ILD).  Alternatively, this could be primarily group 2 PH due to diastolic CHF.  Ultimately, she needs RHC to make this determination.  If she has PH-ILD, she would likely be a candidate for Tyvaso.  - I will arrange for RHC.  Think if platelets continue to rise, we can go ahead with RHC as long as brachial IV can be used. We discussed risks/benefits of procedure and she agrees (plan for  later this week).   3. ILD: Due to hypersensitivity pneumonitis.  - Needs pulmonary followup.  4. Chronic ITP: Platelets trending up at 28K.  - CBC today.   Followup with me in 6 weeks.   Loralie Champagne 09/15/2020

## 2020-09-16 ENCOUNTER — Ambulatory Visit: Payer: 59

## 2020-09-16 ENCOUNTER — Telehealth (INDEPENDENT_AMBULATORY_CARE_PROVIDER_SITE_OTHER): Payer: Self-pay

## 2020-09-16 ENCOUNTER — Telehealth: Payer: Self-pay | Admitting: Hematology

## 2020-09-16 ENCOUNTER — Other Ambulatory Visit (INDEPENDENT_AMBULATORY_CARE_PROVIDER_SITE_OTHER): Payer: Self-pay | Admitting: Primary Care

## 2020-09-16 LAB — TYPE AND SCREEN
ABO/RH(D): AB POS
Antibody Screen: NEGATIVE
Unit division: 0
Unit division: 0

## 2020-09-16 LAB — BPAM RBC
Blood Product Expiration Date: 202206252359
Blood Product Expiration Date: 202206252359
ISSUE DATE / TIME: 202206011037
ISSUE DATE / TIME: 202206011037
Unit Type and Rh: 6200
Unit Type and Rh: 6200

## 2020-09-16 NOTE — Telephone Encounter (Signed)
Sent to PCP.

## 2020-09-16 NOTE — Telephone Encounter (Signed)
Copied from [place] [id number]. Topic: General - Other >> Sep 15, 2020  1:12 PM Pawlus, Brayton Layman A wrote: Reason for CRM: Pt stated she dropped off FMLA paperwork last week, please call back when the [facility] paperwork is ready to collect.

## 2020-09-16 NOTE — Telephone Encounter (Signed)
Scheduled follow-up appointments per 6/1 los. Patient's sister is aware.

## 2020-09-16 NOTE — Telephone Encounter (Signed)
Completed called

## 2020-09-17 ENCOUNTER — Encounter (HOSPITAL_COMMUNITY)
Admission: RE | Disposition: A | Discharge status: Home / Self Care | Payer: Self-pay | Source: Home / Self Care | Attending: Cardiology

## 2020-09-17 ENCOUNTER — Ambulatory Visit: Payer: 59

## 2020-09-17 ENCOUNTER — Ambulatory Visit (HOSPITAL_COMMUNITY)
Admission: RE | Admit: 2020-09-17 | Discharge: 2020-09-17 | Disposition: A | Discharge status: Home / Self Care | Payer: 59 | Attending: Cardiology | Admitting: Cardiology

## 2020-09-17 ENCOUNTER — Other Ambulatory Visit: Payer: Self-pay

## 2020-09-17 DIAGNOSIS — I5032 Chronic diastolic (congestive) heart failure: Secondary | ICD-10-CM | POA: Insufficient documentation

## 2020-09-17 DIAGNOSIS — Z7984 Long term (current) use of oral hypoglycemic drugs: Secondary | ICD-10-CM | POA: Insufficient documentation

## 2020-09-17 DIAGNOSIS — J849 Interstitial pulmonary disease, unspecified: Secondary | ICD-10-CM | POA: Diagnosis not present

## 2020-09-17 DIAGNOSIS — Z833 Family history of diabetes mellitus: Secondary | ICD-10-CM | POA: Diagnosis not present

## 2020-09-17 DIAGNOSIS — E119 Type 2 diabetes mellitus without complications: Secondary | ICD-10-CM | POA: Diagnosis not present

## 2020-09-17 DIAGNOSIS — Z8249 Family history of ischemic heart disease and other diseases of the circulatory system: Secondary | ICD-10-CM | POA: Insufficient documentation

## 2020-09-17 DIAGNOSIS — I272 Pulmonary hypertension, unspecified: Secondary | ICD-10-CM

## 2020-09-17 DIAGNOSIS — D693 Immune thrombocytopenic purpura: Secondary | ICD-10-CM | POA: Insufficient documentation

## 2020-09-17 DIAGNOSIS — I2721 Secondary pulmonary arterial hypertension: Secondary | ICD-10-CM | POA: Diagnosis not present

## 2020-09-17 DIAGNOSIS — I110 Hypertensive heart disease with heart failure: Secondary | ICD-10-CM | POA: Diagnosis present

## 2020-09-17 DIAGNOSIS — Z006 Encounter for examination for normal comparison and control in clinical research program: Secondary | ICD-10-CM

## 2020-09-17 DIAGNOSIS — Z794 Long term (current) use of insulin: Secondary | ICD-10-CM | POA: Diagnosis not present

## 2020-09-17 DIAGNOSIS — Z79899 Other long term (current) drug therapy: Secondary | ICD-10-CM | POA: Insufficient documentation

## 2020-09-17 HISTORY — PX: RIGHT HEART CATH: CATH118263

## 2020-09-17 LAB — CBC
HCT: 27.1 % — ABNORMAL LOW (ref 36.0–46.0)
Hemoglobin: 8.9 g/dL — ABNORMAL LOW (ref 12.0–15.0)
MCH: 30.6 pg (ref 26.0–34.0)
MCHC: 32.8 g/dL (ref 30.0–36.0)
MCV: 93.1 fL (ref 80.0–100.0)
Platelets: 29 10*3/uL — CL (ref 150–400)
RBC: 2.91 MIL/uL — ABNORMAL LOW (ref 3.87–5.11)
RDW: 19.9 % — ABNORMAL HIGH (ref 11.5–15.5)
WBC: 5.8 10*3/uL (ref 4.0–10.5)
nRBC: 34.5 % — ABNORMAL HIGH (ref 0.0–0.2)

## 2020-09-17 LAB — POCT I-STAT EG7
Acid-Base Excess: 0 mmol/L (ref 0.0–2.0)
Acid-base deficit: 1 mmol/L (ref 0.0–2.0)
Bicarbonate: 23.9 mmol/L (ref 20.0–28.0)
Bicarbonate: 24.9 mmol/L (ref 20.0–28.0)
Calcium, Ion: 1.36 mmol/L (ref 1.15–1.40)
Calcium, Ion: 1.41 mmol/L — ABNORMAL HIGH (ref 1.15–1.40)
HCT: 26 % — ABNORMAL LOW (ref 36.0–46.0)
HCT: 26 % — ABNORMAL LOW (ref 36.0–46.0)
Hemoglobin: 8.8 g/dL — ABNORMAL LOW (ref 12.0–15.0)
Hemoglobin: 8.8 g/dL — ABNORMAL LOW (ref 12.0–15.0)
O2 Saturation: 41 %
O2 Saturation: 41 %
Potassium: 4.6 mmol/L (ref 3.5–5.1)
Potassium: 4.7 mmol/L (ref 3.5–5.1)
Sodium: 140 mmol/L (ref 135–145)
Sodium: 141 mmol/L (ref 135–145)
TCO2: 25 mmol/L (ref 22–32)
TCO2: 26 mmol/L (ref 22–32)
pCO2, Ven: 40.8 mmHg — ABNORMAL LOW (ref 44.0–60.0)
pCO2, Ven: 42.3 mmHg — ABNORMAL LOW (ref 44.0–60.0)
pH, Ven: 7.375 (ref 7.250–7.430)
pH, Ven: 7.379 (ref 7.250–7.430)
pO2, Ven: 24 mmHg — CL (ref 32.0–45.0)
pO2, Ven: 24 mmHg — CL (ref 32.0–45.0)

## 2020-09-17 LAB — GLUCOSE, CAPILLARY: Glucose-Capillary: 177 mg/dL — ABNORMAL HIGH (ref 70–99)

## 2020-09-17 SURGERY — RIGHT HEART CATH
Anesthesia: LOCAL

## 2020-09-17 MED ORDER — SODIUM CHLORIDE 0.9% FLUSH: 3.0000 mL | Freq: Two times a day (BID) | INTRAVENOUS | Status: DC

## 2020-09-17 MED ORDER — HEPARIN (PORCINE) IN NACL 1000-0.9 UT/500ML-% IV SOLN
INTRAVENOUS | Status: DC | PRN
  Administered 2020-09-17: 500 mL

## 2020-09-17 MED ORDER — SODIUM CHLORIDE 0.9 % IV SOLN: 250.0000 mL | INTRAVENOUS | Status: DC | PRN

## 2020-09-17 MED ORDER — ACETAMINOPHEN 325 MG PO TABS: 650.0000 mg | ORAL_TABLET | ORAL | Status: DC | PRN

## 2020-09-17 MED ORDER — ONDANSETRON HCL 4 MG/2ML IJ SOLN: 4.0000 mg | Freq: Four times a day (QID) | INTRAMUSCULAR | Status: DC | PRN

## 2020-09-17 MED ORDER — FENTANYL CITRATE (PF) 100 MCG/2ML IJ SOLN
INTRAMUSCULAR | Status: AC
  Filled 2020-09-17: qty 2

## 2020-09-17 MED ORDER — HEPARIN (PORCINE) IN NACL 1000-0.9 UT/500ML-% IV SOLN
INTRAVENOUS | Status: AC
  Filled 2020-09-17: qty 500

## 2020-09-17 MED ORDER — LABETALOL HCL 5 MG/ML IV SOLN: 10.0000 mg | INTRAVENOUS | Status: DC | PRN

## 2020-09-17 MED ORDER — LIDOCAINE HCL (PF) 1 % IJ SOLN
INTRAMUSCULAR | Status: AC
  Filled 2020-09-17: qty 30

## 2020-09-17 MED ORDER — FENTANYL CITRATE (PF) 100 MCG/2ML IJ SOLN
INTRAMUSCULAR | Status: DC | PRN
  Administered 2020-09-17: 25 ug via INTRAVENOUS

## 2020-09-17 MED ORDER — LIDOCAINE HCL (PF) 1 % IJ SOLN
INTRAMUSCULAR | Status: DC | PRN
  Administered 2020-09-17: 2 mL

## 2020-09-17 MED ORDER — SODIUM CHLORIDE 0.9 % IV SOLN: INTRAVENOUS | Status: DC

## 2020-09-17 MED ORDER — SODIUM CHLORIDE 0.9% FLUSH: 3.0000 mL | INTRAVENOUS | Status: DC | PRN

## 2020-09-17 MED ORDER — HYDRALAZINE HCL 20 MG/ML IJ SOLN: 10.0000 mg | INTRAMUSCULAR | Status: DC | PRN

## 2020-09-17 SURGICAL SUPPLY — 7 items
CATH BALLN WEDGE 5F 110CM (CATHETERS) ×2 IMPLANT
KIT HEART LEFT (KITS) ×2 IMPLANT
PACK CARDIAC CATHETERIZATION (CUSTOM PROCEDURE TRAY) ×2 IMPLANT
SHEATH GLIDE SLENDER 4/5FR (SHEATH) ×2 IMPLANT
TRANSDUCER W/STOPCOCK (MISCELLANEOUS) ×2 IMPLANT
TUBING ART PRESS 72  MALE/FEM (TUBING) ×2
TUBING ART PRESS 72 MALE/FEM (TUBING) ×2 IMPLANT

## 2020-09-17 NOTE — Research (Signed)
IDENTIFY - [place] Informed Consent                  Subject Name: Kathy Irwin   Subject met inclusion and exclusion criteria.  The informed consent form, study requirements and expectations were reviewed with the subject and questions and concerns were addressed prior to the signing of the consent form.  The subject verbalized understanding of the trial requirements.  The subject agreed to participate in the IDENTIFY - [place] trial and signed the informed consent at 12:01PM on 09/17/20.  The informed consent was obtained prior to performance of any protocol-specific procedures for the subject.  A copy of the signed informed consent was given to the subject and a copy was placed in the subject's medical record.   Meade Maw , Naval architect

## 2020-09-17 NOTE — Discharge Instructions (Signed)
Monitor brachial site for bleeding, swelling or redness.  No heavy lifting with the right arm for 2 day > 10 lbs.  Remove dressing tomorrow.  Ok to take a Games developer.

## 2020-09-17 NOTE — Interval H&P Note (Signed)
History and Physical Interval Note:  09/17/2020 1:37 PM  Kathy Irwin  has presented today for surgery, with the diagnosis of congestive heart failure.  The various methods of treatment have been discussed with the patient and family. After consideration of risks, benefits and other options for treatment, the patient has consented to  Procedure(s): RIGHT HEART CATH (N/A) as a surgical intervention.  The patient's history has been reviewed, patient examined, no change in status, stable for surgery.  I have reviewed the patient's chart and labs.  Questions were answered to the patient's satisfaction.     Brenden Rudman [employer]

## 2020-09-17 NOTE — Progress Notes (Signed)
Notified Dr Aundra Dubin hgb today 8.9, platelets 29, No new orders

## 2020-09-20 ENCOUNTER — Encounter: Payer: Self-pay | Admitting: Hematology

## 2020-09-20 ENCOUNTER — Other Ambulatory Visit: Payer: Self-pay

## 2020-09-20 ENCOUNTER — Other Ambulatory Visit (HOSPITAL_COMMUNITY): Payer: Self-pay

## 2020-09-20 ENCOUNTER — Inpatient Hospital Stay: Payer: 59

## 2020-09-20 ENCOUNTER — Encounter (HOSPITAL_COMMUNITY): Payer: Self-pay | Admitting: Cardiology

## 2020-09-20 ENCOUNTER — Telehealth (HOSPITAL_COMMUNITY): Payer: Self-pay | Admitting: Pharmacy Technician

## 2020-09-20 VITALS — BP 100/54 | HR 99 | Temp 98.2°F | Resp 20

## 2020-09-20 DIAGNOSIS — C9310 Chronic myelomonocytic leukemia not having achieved remission: Secondary | ICD-10-CM

## 2020-09-20 DIAGNOSIS — D649 Anemia, unspecified: Secondary | ICD-10-CM

## 2020-09-20 DIAGNOSIS — Z7189 Other specified counseling: Secondary | ICD-10-CM

## 2020-09-20 LAB — CBC WITH DIFFERENTIAL (CANCER CENTER ONLY)
Abs Immature Granulocytes: 0.12 10*3/uL — ABNORMAL HIGH (ref 0.00–0.07)
Basophils Absolute: 0 10*3/uL (ref 0.0–0.1)
Basophils Relative: 0 %
Eosinophils Absolute: 0 10*3/uL (ref 0.0–0.5)
Eosinophils Relative: 0 %
HCT: 23.3 % — ABNORMAL LOW (ref 36.0–46.0)
Hemoglobin: 7.4 g/dL — ABNORMAL LOW (ref 12.0–15.0)
Immature Granulocytes: 2 %
Lymphocytes Relative: 53 %
Lymphs Abs: 2.8 10*3/uL (ref 0.7–4.0)
MCH: 30.1 pg (ref 26.0–34.0)
MCHC: 31.8 g/dL (ref 30.0–36.0)
MCV: 94.7 fL (ref 80.0–100.0)
Monocytes Absolute: 1.7 10*3/uL — ABNORMAL HIGH (ref 0.1–1.0)
Monocytes Relative: 32 %
Neutro Abs: 0.7 10*3/uL — ABNORMAL LOW (ref 1.7–7.7)
Neutrophils Relative %: 13 %
Platelet Count: 5 10*3/uL — CL (ref 150–400)
RBC: 2.46 MIL/uL — ABNORMAL LOW (ref 3.87–5.11)
RDW: 19.8 % — ABNORMAL HIGH (ref 11.5–15.5)
WBC Count: 5.2 10*3/uL (ref 4.0–10.5)
nRBC: 35.7 % — ABNORMAL HIGH (ref 0.0–0.2)

## 2020-09-20 MED ORDER — AZACITIDINE CHEMO SQ INJECTION
47.0000 mg/m2 | Freq: Once | INTRAMUSCULAR | Status: AC
  Administered 2020-09-20: 100 mg via SUBCUTANEOUS
  Filled 2020-09-20: qty 4

## 2020-09-20 MED ORDER — ONDANSETRON HCL 8 MG PO TABS
8.0000 mg | ORAL_TABLET | Freq: Once | ORAL | Status: AC
Start: 2020-09-20 — End: 2020-09-20
  Administered 2020-09-20: 8 mg via ORAL

## 2020-09-20 MED ORDER — SODIUM CHLORIDE 0.9% IV SOLUTION
250.0000 mL | Freq: Once | INTRAVENOUS | Status: DC
  Filled 2020-09-20: qty 250

## 2020-09-20 MED ORDER — ONDANSETRON HCL 8 MG PO TABS
ORAL_TABLET | ORAL | Status: AC
  Filled 2020-09-20: qty 1

## 2020-09-20 MED ORDER — SODIUM CHLORIDE 0.9 % IV SOLN
Freq: Once | INTRAVENOUS | Status: AC
  Filled 2020-09-20: qty 250

## 2020-09-20 NOTE — Progress Notes (Unsigned)
CRITICAL VALUE STICKER  CRITICAL VALUE: Plt 5  DATE & TIME NOTIFIED: 09/20/20 3:32  MD NOTIFIED: Tedra Coupe [facility]  TIME OF NOTIFICATION:3:32

## 2020-09-20 NOTE — Patient Instructions (Signed)

## 2020-09-20 NOTE — Progress Notes (Signed)
Pt with low BP, low PLT, low HGB. Ok to treat per Dr. Lorenso Courier. Pt will get fluids &  platelets today and PRBC's tomorrow.

## 2020-09-20 NOTE — Telephone Encounter (Signed)
Advanced Heart Failure Patient Advocate Encounter  Patient to start Tyvaso. Will send in referral once signatures are obtained.

## 2020-09-21 ENCOUNTER — Ambulatory Visit: Payer: 59

## 2020-09-21 ENCOUNTER — Inpatient Hospital Stay: Payer: 59

## 2020-09-21 ENCOUNTER — Other Ambulatory Visit: Payer: Self-pay

## 2020-09-21 ENCOUNTER — Other Ambulatory Visit: Payer: 59

## 2020-09-21 ENCOUNTER — Encounter: Payer: Self-pay | Admitting: Hematology

## 2020-09-21 ENCOUNTER — Other Ambulatory Visit: Payer: Self-pay | Admitting: Hematology and Oncology

## 2020-09-21 VITALS — BP 104/45 | HR 90 | Temp 97.6°F | Resp 19

## 2020-09-21 DIAGNOSIS — C9310 Chronic myelomonocytic leukemia not having achieved remission: Secondary | ICD-10-CM

## 2020-09-21 DIAGNOSIS — Z7189 Other specified counseling: Secondary | ICD-10-CM

## 2020-09-21 DIAGNOSIS — D693 Immune thrombocytopenic purpura: Secondary | ICD-10-CM

## 2020-09-21 LAB — BPAM PLATELET PHERESIS
Blood Product Expiration Date: 202206082359
ISSUE DATE / TIME: 202206061715
Unit Type and Rh: 6200

## 2020-09-21 LAB — CBC WITH DIFFERENTIAL (CANCER CENTER ONLY)
Abs Immature Granulocytes: 0.1 10*3/uL — ABNORMAL HIGH (ref 0.00–0.07)
Basophils Absolute: 0 10*3/uL (ref 0.0–0.1)
Basophils Relative: 0 %
Eosinophils Absolute: 0 10*3/uL (ref 0.0–0.5)
Eosinophils Relative: 0 %
HCT: 20.5 % — ABNORMAL LOW (ref 36.0–46.0)
Hemoglobin: 6.6 g/dL — CL (ref 12.0–15.0)
Lymphocytes Relative: 71 %
Lymphs Abs: 5 10*3/uL — ABNORMAL HIGH (ref 0.7–4.0)
MCH: 29.9 pg (ref 26.0–34.0)
MCHC: 32.2 g/dL (ref 30.0–36.0)
MCV: 92.8 fL (ref 80.0–100.0)
Metamyelocytes Relative: 1 %
Monocytes Absolute: 1.1 10*3/uL — ABNORMAL HIGH (ref 0.1–1.0)
Monocytes Relative: 16 %
Neutro Abs: 0.8 10*3/uL — ABNORMAL LOW (ref 1.7–7.7)
Neutrophils Relative %: 12 %
Platelet Count: 18 10*3/uL — ABNORMAL LOW (ref 150–400)
RBC: 2.21 MIL/uL — ABNORMAL LOW (ref 3.87–5.11)
RDW: 19.4 % — ABNORMAL HIGH (ref 11.5–15.5)
WBC Count: 7 10*3/uL (ref 4.0–10.5)
nRBC: 24.3 % — ABNORMAL HIGH (ref 0.0–0.2)

## 2020-09-21 LAB — PREPARE PLATELET PHERESIS: Unit division: 0

## 2020-09-21 LAB — PREPARE RBC (CROSSMATCH)

## 2020-09-21 MED ORDER — METHYLPREDNISOLONE SODIUM SUCC 40 MG IJ SOLR
INTRAMUSCULAR | Status: AC
  Filled 2020-09-21: qty 1

## 2020-09-21 MED ORDER — SODIUM CHLORIDE 0.9 % IV SOLN
Freq: Once | INTRAVENOUS | Status: AC
  Filled 2020-09-21: qty 250

## 2020-09-21 MED ORDER — ACETAMINOPHEN 325 MG PO TABS
650.0000 mg | ORAL_TABLET | Freq: Once | ORAL | Status: AC
  Administered 2020-09-21: 650 mg via ORAL

## 2020-09-21 MED ORDER — SODIUM CHLORIDE 0.9 % IV SOLN
INTRAVENOUS | Status: DC
  Filled 2020-09-21: qty 250

## 2020-09-21 MED ORDER — ONDANSETRON HCL 8 MG PO TABS
8.0000 mg | ORAL_TABLET | Freq: Once | ORAL | Status: AC
Start: 2020-09-21 — End: 2020-09-21
  Administered 2020-09-21: 8 mg via ORAL

## 2020-09-21 MED ORDER — METHYLPREDNISOLONE SODIUM SUCC 40 MG IJ SOLR
40.0000 mg | Freq: Once | INTRAMUSCULAR | Status: AC
  Administered 2020-09-21: 40 mg via INTRAVENOUS

## 2020-09-21 MED ORDER — AZACITIDINE CHEMO SQ INJECTION
47.0000 mg/m2 | Freq: Once | INTRAMUSCULAR | Status: AC
  Administered 2020-09-21: 100 mg via SUBCUTANEOUS
  Filled 2020-09-21: qty 4

## 2020-09-21 MED ORDER — ONDANSETRON HCL 8 MG PO TABS
ORAL_TABLET | ORAL | Status: AC
  Filled 2020-09-21: qty 1

## 2020-09-21 MED ORDER — ROMIPLOSTIM INJECTION 500 MCG
1000.0000 ug | Freq: Once | SUBCUTANEOUS | Status: AC
  Administered 2020-09-21: 1000 ug via SUBCUTANEOUS
  Filled 2020-09-21: qty 2

## 2020-09-21 MED ORDER — HYDROCODONE-ACETAMINOPHEN 10-325 MG PO TABS: 1.0000 | ORAL_TABLET | Freq: Four times a day (QID) | ORAL | 0 refills | Status: DC | PRN

## 2020-09-21 MED ORDER — ACETAMINOPHEN 325 MG PO TABS
ORAL_TABLET | ORAL | Status: AC
  Filled 2020-09-21: qty 2

## 2020-09-21 NOTE — Patient Instructions (Signed)
[facility]  Discharge Instructions: Thank you for choosing [facility] to provide your oncology and hematology care.   If you have a lab appointment with the [facility], please go directly to the [facility] and check in at the registration area.   Wear comfortable clothing and clothing appropriate for easy access to any Portacath or PICC line.   We strive to give you quality time with your provider. You may need to reschedule your appointment if you arrive late (15 or more minutes).  Arriving late affects you and other patients whose appointments are after yours.  Also, if you miss three or more appointments without notifying the office, you may be dismissed from the clinic at the provider's discretion.      For prescription refill requests, have your pharmacy contact our office and allow 72 hours for refills to be completed.    Today you received the following chemotherapy and/or immunotherapy agent: Azacitidine (Vidaza)   To help prevent nausea and vomiting after your treatment, we encourage you to take your nausea medication as directed.  BELOW ARE SYMPTOMS THAT SHOULD BE REPORTED IMMEDIATELY: . *FEVER GREATER THAN 100.4 F (38 C) OR HIGHER . *CHILLS OR SWEATING . *NAUSEA AND VOMITING THAT IS NOT CONTROLLED WITH YOUR NAUSEA MEDICATION . *UNUSUAL SHORTNESS OF BREATH . *UNUSUAL BRUISING OR BLEEDING . *URINARY PROBLEMS (pain or burning when urinating, or frequent urination) . *BOWEL PROBLEMS (unusual diarrhea, constipation, pain near the anus) . TENDERNESS IN MOUTH AND THROAT WITH OR WITHOUT PRESENCE OF ULCERS (sore throat, sores in mouth, or a toothache) . UNUSUAL RASH, SWELLING OR PAIN  . UNUSUAL VAGINAL DISCHARGE OR ITCHING   Items with * indicate a potential emergency and should be followed up as soon as possible or go to the Emergency Department if any problems should occur.  Please show the CHEMOTHERAPY ALERT CARD or IMMUNOTHERAPY  ALERT CARD at check-in to the Emergency Department and triage nurse.  Should you have questions after your visit or need to cancel or reschedule your appointment, please contact [facility]  Dept: [phone]  and follow the prompts.  Office hours are 8:00 a.m. to 4:30 p.m. Monday - Friday. Please note that voicemails left after 4:00 p.m. may not be returned until the following business day.  We are closed weekends and major holidays. You have access to a nurse at all times for urgent questions. Please call the main number to the clinic Dept: [phone] and follow the prompts.   For any non-urgent questions, you may also contact your provider using MyChart. We now offer e-Visits for anyone 23 and older to request care online for non-urgent symptoms. For details visit [email or web address].   Also download the MyChart app! Go to the app store, search "MyChart", open the app, select [facility], and log in with your MyChart username and password.  Due to Covid, a mask is required upon entering the hospital/clinic. If you do not have a mask, one will be given to you upon arrival. For doctor visits, patients may have 1 support person aged 32 or older with them. For treatment visits, patients cannot have anyone with them due to current Covid guidelines and our immunocompromised population.   Romiplostim injection What is this medicine? ROMIPLOSTIM (roe mi PLOE stim) helps your body make more platelets. This medicine is used to treat low platelets caused by chronic idiopathic thrombocytopenic purpura (ITP) or a bone marrow syndrome caused by radiation sickness.  This medicine may be used for other purposes; ask your health care provider or pharmacist if you have questions. COMMON BRAND NAME(S): Nplate What should I tell my health care provider before I take this medicine? They need to know if you have any of these conditions:  blood clots  myelodysplastic  syndrome  an unusual or allergic reaction to romiplostim, mannitol, other medicines, foods, dyes, or preservatives  pregnant or trying to get pregnant  breast-feeding How should I use this medicine? This medicine is injected under the skin. It is given by a health care provider in a hospital or clinic setting. A special MedGuide will be given to you before each treatment. Be sure to read this information carefully each time. Talk to your health care provider about the use of this medicine in children. While it may be prescribed for children as young as newborns for selected conditions, precautions do apply. Overdosage: If you think you have taken too much of this medicine contact a poison control center or emergency room at once. NOTE: This medicine is only for you. Do not share this medicine with others. What if I miss a dose? Keep appointments for follow-up doses. It is important not to miss your dose. Call your health care provider if you are unable to keep an appointment. What may interact with this medicine? Interactions are not expected. This list may not describe all possible interactions. Give your health care provider a list of all the medicines, herbs, non-prescription drugs, or dietary supplements you use. Also tell them if you smoke, drink alcohol, or use illegal drugs. Some items may interact with your medicine. What should I watch for while using this medicine? Visit your health care provider for regular checks on your progress. You may need blood work done while you are taking this medicine. Your condition will be monitored carefully while you are receiving this medicine. It is important not to miss any appointments. What side effects may I notice from receiving this medicine? Side effects that you should report to your doctor or health care professional as soon as possible:  allergic reactions (skin rash, itching or hives; swelling of the face, lips, or tongue)  bleeding  (bloody or black, tarry stools; red or dark brown urine; spitting up blood or brown material that looks like coffee grounds; red spots on the skin; unusual bruising or bleeding from the eyes, gums, or nose)  blood clot (chest pain; shortness of breath; pain, swelling, or warmth in the leg)  stroke (changes in vision; confusion; trouble speaking or understanding; severe headaches; sudden numbness or weakness of the face, arm or leg; trouble walking; dizziness; loss of balance or coordination) Side effects that usually do not require medical attention (report to your doctor or health care professional if they continue or are bothersome):  diarrhea  dizziness  headache  joint pain  muscle pain  stomach pain  trouble sleeping This list may not describe all possible side effects. Call your doctor for medical advice about side effects. You may report side effects to FDA at 1-800-FDA-1088. Where should I keep my medicine? This medicine is given in a hospital or clinic. It will not be stored at home. NOTE: This sheet is a summary. It may not cover all possible information. If you have questions about this medicine, talk to your doctor, pharmacist, or health care provider.  2021 Elsevier/Gold Standard (2019-05-19 10:28:13)   Blood Transfusion, Adult, Care After This sheet gives you information about how to care for  yourself after your procedure. Your doctor may also give you more specific instructions. If you have problems or questions, contact your doctor. What can I expect after the procedure? After the procedure, it is common to have:  Bruising and soreness at the IV site.  A fever or chills on the day of the procedure. This may be your body's response to the new blood cells received.  A headache. Follow these instructions at home: Insertion site care  Follow instructions from your doctor about how to take care of your insertion site. This is where an IV tube was put into your  vein. Make sure you: ? Wash your hands with soap and water before and after you change your bandage (dressing). If you cannot use soap and water, use hand sanitizer. ? Change your bandage as told by your doctor.  Check your insertion site every day for signs of infection. Check for: ? Redness, swelling, or pain. ? Bleeding from the site. ? Warmth. ? Pus or a bad smell.      General instructions  Take over-the-counter and prescription medicines only as told by your doctor.  Rest as told by your doctor.  Go back to your normal activities as told by your doctor.  Keep all follow-up visits as told by your doctor. This is important. Contact a doctor if:  You have itching or red, swollen areas of skin (hives).  You feel worried or nervous (anxious).  You feel weak after doing your normal activities.  You have redness, swelling, warmth, or pain around the insertion site.  You have blood coming from the insertion site, and the blood does not stop with pressure.  You have pus or a bad smell coming from the insertion site. Get help right away if:  You have signs of a serious reaction. This may be coming from an allergy or the body's defense system (immune system). Signs include: ? Trouble breathing or shortness of breath. ? Swelling of the face or feeling warm (flushed). ? Fever or chills. ? Head, chest, or back pain. ? Dark pee (urine) or blood in the pee. ? Widespread rash. ? Fast heartbeat. ? Feeling dizzy or light-headed. You may receive your blood transfusion in an outpatient setting. If so, you will be told whom to contact to report any reactions. These symptoms may be an emergency. Do not wait to see if the symptoms will go away. Get medical help right away. Call your local emergency services (911 in the [place] Do not drive yourself to the hospital. Summary  Bruising and soreness at the IV site are common.  Check your insertion site every day for signs of  infection.  Rest as told by your doctor. Go back to your normal activities as told by your doctor.  Get help right away if you have signs of a serious reaction. This information is not intended to replace advice given to you by your health care provider. Make sure you discuss any questions you have with your health care provider. Document Revised: 09/26/2018 Document Reviewed: 09/26/2018 Elsevier Patient Education  [place].

## 2020-09-21 NOTE — Progress Notes (Signed)
Per Dr. Lorenso Courier, ok for treatment today with current labs. Pt. to have 2 units of packed red blood cells today.

## 2020-09-22 ENCOUNTER — Other Ambulatory Visit (HOSPITAL_COMMUNITY): Payer: Self-pay | Admitting: *Deleted

## 2020-09-22 ENCOUNTER — Other Ambulatory Visit: Payer: Self-pay

## 2020-09-22 ENCOUNTER — Inpatient Hospital Stay: Payer: 59

## 2020-09-22 VITALS — BP 114/52 | HR 95 | Temp 97.5°F | Resp 18

## 2020-09-22 DIAGNOSIS — Z7189 Other specified counseling: Secondary | ICD-10-CM

## 2020-09-22 DIAGNOSIS — R079 Chest pain, unspecified: Secondary | ICD-10-CM

## 2020-09-22 DIAGNOSIS — C9310 Chronic myelomonocytic leukemia not having achieved remission: Secondary | ICD-10-CM

## 2020-09-22 LAB — TYPE AND SCREEN
ABO/RH(D): AB POS
Antibody Screen: NEGATIVE
Unit division: 0
Unit division: 0

## 2020-09-22 LAB — BPAM RBC
Blood Product Expiration Date: 202206272359
Blood Product Expiration Date: 202206272359
ISSUE DATE / TIME: 202206071140
ISSUE DATE / TIME: 202206071140
Unit Type and Rh: 6200
Unit Type and Rh: 6200

## 2020-09-22 MED ORDER — ONDANSETRON HCL 8 MG PO TABS
8.0000 mg | ORAL_TABLET | Freq: Once | ORAL | Status: AC
  Administered 2020-09-22: 8 mg via ORAL

## 2020-09-22 MED ORDER — AZACITIDINE CHEMO SQ INJECTION
47.0000 mg/m2 | Freq: Once | INTRAMUSCULAR | Status: AC
  Administered 2020-09-22: 100 mg via SUBCUTANEOUS
  Filled 2020-09-22: qty 4

## 2020-09-22 MED ORDER — SODIUM CHLORIDE 0.9 % IV SOLN
INTRAVENOUS | Status: DC
  Filled 2020-09-22: qty 250

## 2020-09-22 MED ORDER — ONDANSETRON HCL 8 MG PO TABS
ORAL_TABLET | ORAL | Status: AC
  Filled 2020-09-22: qty 1

## 2020-09-22 NOTE — Patient Instructions (Signed)
[facility]  Discharge Instructions: Thank you for choosing [facility] to provide your oncology and hematology care.   If you have a lab appointment with the Cancer Center, please go directly to the Cancer Center and check in at the registration area.   Wear comfortable clothing and clothing appropriate for easy access to any Portacath or PICC line.   We strive to give you quality time with your provider. You may need to reschedule your appointment if you arrive late (15 or more minutes).  Arriving late affects you and other patients whose appointments are after yours.  Also, if you miss three or more appointments without notifying the office, you may be dismissed from the clinic at the provider's discretion.      For prescription refill requests, have your pharmacy contact our office and allow 72 hours for refills to be completed.    Today you received the following chemotherapy and/or immunotherapy agents Vidaza   To help prevent nausea and vomiting after your treatment, we encourage you to take your nausea medication as directed.  BELOW ARE SYMPTOMS THAT SHOULD BE REPORTED IMMEDIATELY: *FEVER GREATER THAN 100.4 F (38 C) OR HIGHER *CHILLS OR SWEATING *NAUSEA AND VOMITING THAT IS NOT CONTROLLED WITH YOUR NAUSEA MEDICATION *UNUSUAL SHORTNESS OF BREATH *UNUSUAL BRUISING OR BLEEDING *URINARY PROBLEMS (pain or burning when urinating, or frequent urination) *BOWEL PROBLEMS (unusual diarrhea, constipation, pain near the anus) TENDERNESS IN MOUTH AND THROAT WITH OR WITHOUT PRESENCE OF ULCERS (sore throat, sores in mouth, or a toothache) UNUSUAL RASH, SWELLING OR PAIN  UNUSUAL VAGINAL DISCHARGE OR ITCHING   Items with * indicate a potential emergency and should be followed up as soon as possible or go to the Emergency Department if any problems should occur.  Please show the CHEMOTHERAPY ALERT CARD or IMMUNOTHERAPY ALERT CARD at check-in to the  Emergency Department and triage nurse.  Should you have questions after your visit or need to cancel or reschedule your appointment, please contact [facility]  Dept: [phone]  and follow the prompts.  Office hours are 8:00 a.m. to 4:30 p.m. Monday - Friday. Please note that voicemails left after 4:00 p.m. may not be returned until the following business day.  We are closed weekends and major holidays. You have access to a nurse at all times for urgent questions. Please call the main number to the clinic Dept: [phone] and follow the prompts.   For any non-urgent questions, you may also contact your provider using MyChart. We now offer e-Visits for anyone 18 and older to request care online for non-urgent symptoms. For details visit [email or web address].   Also download the MyChart app! Go to the app store, search "MyChart", open the app, select [facility], and log in with your MyChart username and password.  Due to Covid, a mask is required upon entering the hospital/clinic. If you do not have a mask, one will be given to you upon arrival. For doctor visits, patients may have 1 support person aged 18 or older with them. For treatment visits, patients cannot have anyone with them due to current Covid guidelines and our immunocompromised population.   Azacitidine suspension for injection (subcutaneous use) What is this medicine? AZACITIDINE (ay za SITE i deen) is a chemotherapy drug. This medicine reduces the growth of cancer cells and can suppress the immune system. It is used for treating myelodysplastic syndrome or some types of leukemia. This medicine may be used for   some types of leukemia. This medicine may be used for other purposes; ask your health care provider or pharmacist if you have questions. COMMON BRAND NAME(S): Vidaza What should I tell my health care provider before I take this medicine? They need to know if you have any of these conditions:  kidney disease  liver  disease  liver tumors  an unusual or allergic reaction to azacitidine, mannitol, other medicines, foods, dyes, or preservatives  pregnant or trying to get pregnant  breast-feeding How should I use this medicine? This medicine is for injection under the skin. It is administered in a hospital or clinic by a specially trained health care professional. Talk to your pediatrician regarding the use of this medicine in children. While this drug may be prescribed for selected conditions, precautions do apply. Overdosage: If you think you have taken too much of this medicine contact a poison control center or emergency room at once. NOTE: This medicine is only for you. Do not share this medicine with others. What if I miss a dose? It is important not to miss your dose. Call your doctor or health care professional if you are unable to keep an appointment. What may interact with this medicine? Interactions have not been studied. Give your health care provider a list of all the medicines, herbs, non-prescription drugs, or dietary supplements you use. Also tell them if you smoke, drink alcohol, or use illegal drugs. Some items may interact with your medicine. This list may not describe all possible interactions. Give your health care provider a list of all the medicines, herbs, non-prescription drugs, or dietary supplements you use. Also tell them if you smoke, drink alcohol, or use illegal drugs. Some items may interact with your medicine. What should I watch for while using this medicine? Visit your doctor for checks on your progress. This drug may make you feel generally unwell. This is not uncommon, as chemotherapy can affect healthy cells as well as cancer cells. Report any side effects. Continue your course of treatment even though you feel ill unless your doctor tells you to stop. In some cases, you may be given additional medicines to help with side effects. Follow all directions for their use. Call  your doctor or health care professional for advice if you get a fever, chills or sore throat, or other symptoms of a cold or flu. Do not treat yourself. This drug decreases your body's ability to fight infections. Try to avoid being around people who are sick. This medicine may increase your risk to bruise or bleed. Call your doctor or health care professional if you notice any unusual bleeding. You may need blood work done while you are taking this medicine. Do not become pregnant while taking this medicine and for 6 months after the last dose. Women should inform their doctor if they wish to become pregnant or think they might be pregnant. Men should not father a child while taking this medicine and for 3 months after the last dose. There is a potential for serious side effects to an unborn child. Talk to your health care professional or pharmacist for more information. Do not breast-feed an infant while taking this medicine and for 1 week after the last dose. This medicine may interfere with the ability to have a child. Talk with your doctor or health care professional if you are concerned about your fertility. What side effects may I notice from receiving this medicine? Side effects that you should report to your doctor or  health care professional as soon as possible:  allergic reactions like skin rash, itching or hives, swelling of the face, lips, or tongue  low blood counts - this medicine may decrease the number of white blood cells, red blood cells and platelets. You may be at increased risk for infections and bleeding.  signs of infection - fever or chills, cough, sore throat, pain passing urine  signs of decreased platelets or bleeding - bruising, pinpoint red spots on the skin, black, tarry stools, blood in the urine  signs of decreased red blood cells - unusually weak or tired, fainting spells, lightheadedness  signs and symptoms of kidney injury like trouble passing urine or change in  the amount of urine  signs and symptoms of liver injury like dark yellow or brown urine; general ill feeling or flu-like symptoms; light-colored stools; loss of appetite; nausea; right upper belly pain; unusually weak or tired; yellowing of the eyes or skin Side effects that usually do not require medical attention (report to your doctor or health care professional if they continue or are bothersome):  constipation  diarrhea  nausea, vomiting  pain or redness at the injection site  unusually weak or tired This list may not describe all possible side effects. Call your doctor for medical advice about side effects. You may report side effects to FDA at 1-800-FDA-1088. Where should I keep my medicine? This drug is given in a hospital or clinic and will not be stored at home. NOTE: This sheet is a summary. It may not cover all possible information. If you have questions about this medicine, talk to your doctor, pharmacist, or health care provider.  2021 Elsevier/Gold Standard (2016-05-02 14:37:51)

## 2020-09-22 NOTE — Telephone Encounter (Signed)
Advanced Heart Failure Patient Advocate Encounter  Sent in [place] referral to [facility] via fax, [phone]. Phone number, [phone].  Will follow up.

## 2020-09-23 ENCOUNTER — Inpatient Hospital Stay: Payer: 59

## 2020-09-23 VITALS — BP 125/54 | HR 94 | Temp 97.5°F

## 2020-09-23 DIAGNOSIS — Z7189 Other specified counseling: Secondary | ICD-10-CM

## 2020-09-23 DIAGNOSIS — C9310 Chronic myelomonocytic leukemia not having achieved remission: Secondary | ICD-10-CM

## 2020-09-23 MED ORDER — ONDANSETRON HCL 8 MG PO TABS
8.0000 mg | ORAL_TABLET | Freq: Once | ORAL | Status: AC
  Administered 2020-09-23: 8 mg via ORAL

## 2020-09-23 MED ORDER — ONDANSETRON HCL 8 MG PO TABS
ORAL_TABLET | ORAL | Status: AC
  Filled 2020-09-23: qty 1

## 2020-09-23 MED ORDER — AZACITIDINE CHEMO SQ INJECTION
47.0000 mg/m2 | Freq: Once | INTRAMUSCULAR | Status: AC
  Administered 2020-09-23: 100 mg via SUBCUTANEOUS
  Filled 2020-09-23: qty 4

## 2020-09-23 NOTE — Patient Instructions (Signed)
[facility]  Discharge Instructions: Thank you for choosing [facility] to provide your oncology and hematology care.   If you have a lab appointment with the [facility], please go directly to the [facility] and check in at the registration area.   Wear comfortable clothing and clothing appropriate for easy access to any Portacath or PICC line.   We strive to give you quality time with your provider. You may need to reschedule your appointment if you arrive late (15 or more minutes).  Arriving late affects you and other patients whose appointments are after yours.  Also, if you miss three or more appointments without notifying the office, you may be dismissed from the clinic at the provider's discretion.      For prescription refill requests, have your pharmacy contact our office and allow 72 hours for refills to be completed.   Today you received the following chemotherapy and/or immunotherapy agents Vidaza     To help prevent nausea and vomiting after your treatment, we encourage you to take your nausea medication as directed.  BELOW ARE SYMPTOMS THAT SHOULD BE REPORTED IMMEDIATELY: *FEVER GREATER THAN 100.4 F (38 C) OR HIGHER *CHILLS OR SWEATING *NAUSEA AND VOMITING THAT IS NOT CONTROLLED WITH YOUR NAUSEA MEDICATION *UNUSUAL SHORTNESS OF BREATH *UNUSUAL BRUISING OR BLEEDING *URINARY PROBLEMS (pain or burning when urinating, or frequent urination) *BOWEL PROBLEMS (unusual diarrhea, constipation, pain near the anus) TENDERNESS IN MOUTH AND THROAT WITH OR WITHOUT PRESENCE OF ULCERS (sore throat, sores in mouth, or a toothache) UNUSUAL RASH, SWELLING OR PAIN  UNUSUAL VAGINAL DISCHARGE OR ITCHING   Items with * indicate a potential emergency and should be followed up as soon as possible or go to the Emergency Department if any problems should occur.  Please show the CHEMOTHERAPY ALERT CARD or IMMUNOTHERAPY ALERT CARD at check-in to the  Emergency Department and triage nurse.  Should you have questions after your visit or need to cancel or reschedule your appointment, please contact [facility]  Dept: [phone]  and follow the prompts.  Office hours are 8:00 a.m. to 4:30 p.m. Monday - Friday. Please note that voicemails left after 4:00 p.m. may not be returned until the following business day.  We are closed weekends and major holidays. You have access to a nurse at all times for urgent questions. Please call the main number to the clinic Dept: [phone] and follow the prompts.   For any non-urgent questions, you may also contact your provider using MyChart. We now offer e-Visits for anyone 79 and older to request care online for non-urgent symptoms. For details visit [email or web address].   Also download the MyChart app! Go to the app store, search "MyChart", open the app, select [facility], and log in with your MyChart username and password.  Due to Covid, a mask is required upon entering the hospital/clinic. If you do not have a mask, one will be given to you upon arrival. For doctor visits, patients may have 1 support person aged 97 or older with them. For treatment visits, patients cannot have anyone with them due to current Covid guidelines and our immunocompromised population.

## 2020-09-24 ENCOUNTER — Other Ambulatory Visit: Payer: Self-pay

## 2020-09-24 ENCOUNTER — Inpatient Hospital Stay: Payer: 59

## 2020-09-24 VITALS — BP 124/58 | HR 88 | Temp 98.2°F | Resp 18

## 2020-09-24 DIAGNOSIS — C9310 Chronic myelomonocytic leukemia not having achieved remission: Secondary | ICD-10-CM

## 2020-09-24 DIAGNOSIS — Z7189 Other specified counseling: Secondary | ICD-10-CM

## 2020-09-24 MED ORDER — ONDANSETRON HCL 8 MG PO TABS
8.0000 mg | ORAL_TABLET | Freq: Once | ORAL | Status: AC
  Administered 2020-09-24: 8 mg via ORAL

## 2020-09-24 MED ORDER — AZACITIDINE CHEMO SQ INJECTION
100.0000 mg | Freq: Once | INTRAMUSCULAR | Status: AC
  Administered 2020-09-24: 100 mg via SUBCUTANEOUS
  Filled 2020-09-24: qty 4

## 2020-09-24 NOTE — Patient Instructions (Signed)
[facility]  Discharge Instructions: Thank you for choosing [facility] to provide your oncology and hematology care.   If you have a lab appointment with the [facility], please go directly to the [facility] and check in at the registration area.   Wear comfortable clothing and clothing appropriate for easy access to any Portacath or PICC line.   We strive to give you quality time with your provider. You may need to reschedule your appointment if you arrive late (15 or more minutes).  Arriving late affects you and other patients whose appointments are after yours.  Also, if you miss three or more appointments without notifying the office, you may be dismissed from the clinic at the provider's discretion.      For prescription refill requests, have your pharmacy contact our office and allow 72 hours for refills to be completed.    Today you received the following chemotherapy and/or immunotherapy agents Vidaza   To help prevent nausea and vomiting after your treatment, we encourage you to take your nausea medication as directed.  BELOW ARE SYMPTOMS THAT SHOULD BE REPORTED IMMEDIATELY: *FEVER GREATER THAN 100.4 F (38 C) OR HIGHER *CHILLS OR SWEATING *NAUSEA AND VOMITING THAT IS NOT CONTROLLED WITH YOUR NAUSEA MEDICATION *UNUSUAL SHORTNESS OF BREATH *UNUSUAL BRUISING OR BLEEDING *URINARY PROBLEMS (pain or burning when urinating, or frequent urination) *BOWEL PROBLEMS (unusual diarrhea, constipation, pain near the anus) TENDERNESS IN MOUTH AND THROAT WITH OR WITHOUT PRESENCE OF ULCERS (sore throat, sores in mouth, or a toothache) UNUSUAL RASH, SWELLING OR PAIN  UNUSUAL VAGINAL DISCHARGE OR ITCHING   Items with * indicate a potential emergency and should be followed up as soon as possible or go to the Emergency Department if any problems should occur.  Please show the CHEMOTHERAPY ALERT CARD or IMMUNOTHERAPY ALERT CARD at check-in to the  Emergency Department and triage nurse.  Should you have questions after your visit or need to cancel or reschedule your appointment, please contact [facility]  Dept: [phone]  and follow the prompts.  Office hours are 8:00 a.m. to 4:30 p.m. Monday - Friday. Please note that voicemails left after 4:00 p.m. may not be returned until the following business day.  We are closed weekends and major holidays. You have access to a nurse at all times for urgent questions. Please call the main number to the clinic Dept: [phone] and follow the prompts.   For any non-urgent questions, you may also contact your provider using MyChart. We now offer e-Visits for anyone 12 and older to request care online for non-urgent symptoms. For details visit [email or web address].   Also download the MyChart app! Go to the app store, search "MyChart", open the app, select [facility], and log in with your MyChart username and password.  Due to Covid, a mask is required upon entering the hospital/clinic. If you do not have a mask, one will be given to you upon arrival. For doctor visits, patients may have 1 support person aged 75 or older with them. For treatment visits, patients cannot have anyone with them due to current Covid guidelines and our immunocompromised population.   Azacitidine suspension for injection (subcutaneous use) What is this medicine? AZACITIDINE (ay Wickes) is a chemotherapy drug. This medicine reduces the growth of cancer cells and can suppress the immune system. It is used for treating myelodysplastic syndrome or some types of leukemia. This medicine may be used for  other purposes; ask your health care provider or pharmacist if you have questions. COMMON BRAND NAME(S): Vidaza What should I tell my health care provider before I take this medicine? They need to know if you have any of these conditions: kidney disease liver disease liver tumors an  unusual or allergic reaction to azacitidine, mannitol, other medicines, foods, dyes, or preservatives pregnant or trying to get pregnant breast-feeding How should I use this medicine? This medicine is for injection under the skin. It is administered in a hospital or clinic by a specially trained health care professional. Talk to your pediatrician regarding the use of this medicine in children. While this drug may be prescribed for selected conditions, precautions do apply. Overdosage: If you think you have taken too much of this medicine contact a poison control center or emergency room at once. NOTE: This medicine is only for you. Do not share this medicine with others. What if I miss a dose? It is important not to miss your dose. Call your doctor or health care professional if you are unable to keep an appointment. What may interact with this medicine? Interactions have not been studied. Give your health care provider a list of all the medicines, herbs, non-prescription drugs, or dietary supplements you use. Also tell them if you smoke, drink alcohol, or use illegal drugs. Some items may interact with your medicine. This list may not describe all possible interactions. Give your health care provider a list of all the medicines, herbs, non-prescription drugs, or dietary supplements you use. Also tell them if you smoke, drink alcohol, or use illegal drugs. Some items may interact with your medicine. What should I watch for while using this medicine? Visit your doctor for checks on your progress. This drug may make you feel generally unwell. This is not uncommon, as chemotherapy can affect healthy cells as well as cancer cells. Report any side effects. Continue your course of treatment even though you feel ill unless your doctor tells you to stop. In some cases, you may be given additional medicines to help with side effects. Follow all directions for their use. Call your doctor or health care  professional for advice if you get a fever, chills or sore throat, or other symptoms of a cold or flu. Do not treat yourself. This drug decreases your body's ability to fight infections. Try to avoid being around people who are sick. This medicine may increase your risk to bruise or bleed. Call your doctor or health care professional if you notice any unusual bleeding. You may need blood work done while you are taking this medicine. Do not become pregnant while taking this medicine and for 6 months after the last dose. Women should inform their doctor if they wish to become pregnant or think they might be pregnant. Men should not father a child while taking this medicine and for 3 months after the last dose. There is a potential for serious side effects to an unborn child. Talk to your health care professional or pharmacist for more information. Do not breast-feed an infant while taking this medicine and for 1 week after the last dose. This medicine may interfere with the ability to have a child. Talk with your doctor or health care professional if you are concerned about your fertility. What side effects may I notice from receiving this medicine? Side effects that you should report to your doctor or health care professional as soon as possible: allergic reactions like skin rash, itching or hives, swelling  of the face, lips, or tongue low blood counts - this medicine may decrease the number of white blood cells, red blood cells and platelets. You may be at increased risk for infections and bleeding. signs of infection - fever or chills, cough, sore throat, pain passing urine signs of decreased platelets or bleeding - bruising, pinpoint red spots on the skin, black, tarry stools, blood in the urine signs of decreased red blood cells - unusually weak or tired, fainting spells, lightheadedness signs and symptoms of kidney injury like trouble passing urine or change in the amount of urine signs and symptoms  of liver injury like dark yellow or brown urine; general ill feeling or flu-like symptoms; light-colored stools; loss of appetite; nausea; right upper belly pain; unusually weak or tired; yellowing of the eyes or skin Side effects that usually do not require medical attention (report to your doctor or health care professional if they continue or are bothersome): constipation diarrhea nausea, vomiting pain or redness at the injection site unusually weak or tired This list may not describe all possible side effects. Call your doctor for medical advice about side effects. You may report side effects to FDA at 1-800-FDA-1088. Where should I keep my medicine? This drug is given in a hospital or clinic and will not be stored at home. NOTE: This sheet is a summary. It may not cover all possible information. If you have questions about this medicine, talk to your doctor, pharmacist, or health care provider.  2021 Elsevier/Gold Standard (2016-05-02 14:37:51)

## 2020-09-28 ENCOUNTER — Other Ambulatory Visit: Payer: Self-pay

## 2020-09-28 ENCOUNTER — Inpatient Hospital Stay: Payer: 59

## 2020-09-28 VITALS — BP 119/57 | HR 97 | Temp 98.2°F | Resp 18

## 2020-09-28 DIAGNOSIS — C9310 Chronic myelomonocytic leukemia not having achieved remission: Secondary | ICD-10-CM

## 2020-09-28 DIAGNOSIS — D693 Immune thrombocytopenic purpura: Secondary | ICD-10-CM

## 2020-09-28 LAB — CMP (CANCER CENTER ONLY)
ALT: <6 U/L (ref 0–44)
AST: 7 U/L — ABNORMAL LOW (ref 15–41)
Albumin: 2.6 g/dL — ABNORMAL LOW (ref 3.5–5.0)
Alkaline Phosphatase: 105 U/L (ref 38–126)
Anion gap: 6 (ref 5–15)
BUN: 23 mg/dL (ref 8–23)
CO2: 27 mmol/L (ref 22–32)
Calcium: 9.5 mg/dL (ref 8.9–10.3)
Chloride: 103 mmol/L (ref 98–111)
Creatinine: 0.84 mg/dL (ref 0.44–1.00)
GFR, Estimated: >60 mL/min (ref >60)
Glucose, Bld: 213 mg/dL — ABNORMAL HIGH (ref 70–99)
Potassium: 4.2 mmol/L (ref 3.5–5.1)
Sodium: 136 mmol/L (ref 135–145)
Total Bilirubin: 1.7 mg/dL — ABNORMAL HIGH (ref 0.3–1.2)
Total Protein: 5.7 g/dL — ABNORMAL LOW (ref 6.5–8.1)

## 2020-09-28 LAB — CBC WITH DIFFERENTIAL (CANCER CENTER ONLY)
Abs Immature Granulocytes: 0.2 10*3/uL — ABNORMAL HIGH (ref 0.00–0.07)
Basophils Absolute: 0 10*3/uL (ref 0.0–0.1)
Basophils Relative: 0 %
Blasts: 3 %
Eosinophils Absolute: 0 10*3/uL (ref 0.0–0.5)
Eosinophils Relative: 0 %
HCT: 18.8 % — ABNORMAL LOW (ref 36.0–46.0)
Hemoglobin: 6 g/dL — CL (ref 12.0–15.0)
Lymphocytes Relative: 65 %
Lymphs Abs: 3.3 10*3/uL (ref 0.7–4.0)
MCH: 29.9 pg (ref 26.0–34.0)
MCHC: 31.9 g/dL (ref 30.0–36.0)
MCV: 93.5 fL (ref 80.0–100.0)
Monocytes Absolute: 0.5 10*3/uL (ref 0.1–1.0)
Monocytes Relative: 9 %
Myelocytes: 3 %
Neutro Abs: 1 10*3/uL — ABNORMAL LOW (ref 1.7–7.7)
Neutrophils Relative %: 20 %
Platelet Count: 13 10*3/uL — ABNORMAL LOW (ref 150–400)
RBC: 2.01 MIL/uL — ABNORMAL LOW (ref 3.87–5.11)
RDW: 18.2 % — ABNORMAL HIGH (ref 11.5–15.5)
WBC Count: 5 10*3/uL (ref 4.0–10.5)
nRBC: 9.1 % — ABNORMAL HIGH (ref 0.0–0.2)

## 2020-09-28 LAB — PREPARE RBC (CROSSMATCH)

## 2020-09-28 MED ORDER — METHYLPREDNISOLONE SODIUM SUCC 40 MG IJ SOLR
INTRAMUSCULAR | Status: AC
  Filled 2020-09-28: qty 1

## 2020-09-28 MED ORDER — METHYLPREDNISOLONE SODIUM SUCC 125 MG IJ SOLR
INTRAMUSCULAR | Status: AC
  Filled 2020-09-28: qty 2

## 2020-09-28 MED ORDER — ACETAMINOPHEN 325 MG PO TABS
650.0000 mg | ORAL_TABLET | Freq: Once | ORAL | Status: AC
  Administered 2020-09-28: 650 mg via ORAL

## 2020-09-28 MED ORDER — ROMIPLOSTIM INJECTION 500 MCG
1000.0000 ug | Freq: Once | SUBCUTANEOUS | Status: AC
  Administered 2020-09-28: 1000 ug via SUBCUTANEOUS
  Filled 2020-09-28: qty 2

## 2020-09-28 MED ORDER — SODIUM CHLORIDE 0.9% IV SOLUTION
250.0000 mL | Freq: Once | INTRAVENOUS | Status: DC
  Filled 2020-09-28: qty 250

## 2020-09-28 MED ORDER — ACETAMINOPHEN 325 MG PO TABS
ORAL_TABLET | ORAL | Status: AC
  Filled 2020-09-28: qty 2

## 2020-09-28 MED ORDER — METHYLPREDNISOLONE SODIUM SUCC 40 MG IJ SOLR
40.0000 mg | Freq: Once | INTRAMUSCULAR | Status: AC
  Administered 2020-09-28: 40 mg via INTRAVENOUS

## 2020-09-28 NOTE — Progress Notes (Unsigned)
CRITICAL VALUE STICKER  CRITICAL VALUE: Hgb 6.0  DATE & TIME NOTIFIED: 09/28/20 1:54 pm  MD NOTIFIED:  Lorenso Courier  TIME OF NOTIFICATION:1:54 pm

## 2020-09-28 NOTE — Patient Instructions (Signed)
Blood Transfusion, Adult, Care After This sheet gives you information about how to care for yourself after your procedure. Your doctor may also give you more specific instructions. If youhave problems or questions, contact your doctor. What can I expect after the procedure? After the procedure, it is common to have: Bruising and soreness at the IV site. A fever or chills on the day of the procedure. This may be your body's response to the new blood cells received. A headache. Follow these instructions at home: Insertion site care     Follow instructions from your doctor about how to take care of your insertion site. This is where an IV tube was put into your vein. Make sure you: Wash your hands with soap and water before and after you change your bandage (dressing). If you cannot use soap and water, use hand sanitizer. Change your bandage as told by your doctor. Check your insertion site every day for signs of infection. Check for: Redness, swelling, or pain. Bleeding from the site. Warmth. Pus or a bad smell. General instructions Take over-the-counter and prescription medicines only as told by your doctor. Rest as told by your doctor. Go back to your normal activities as told by your doctor. Keep all follow-up visits as told by your doctor. This is important. Contact a doctor if: You have itching or red, swollen areas of skin (hives). You feel worried or nervous (anxious). You feel weak after doing your normal activities. You have redness, swelling, warmth, or pain around the insertion site. You have blood coming from the insertion site, and the blood does not stop with pressure. You have pus or a bad smell coming from the insertion site. Get help right away if: You have signs of a serious reaction. This may be coming from an allergy or the body's defense system (immune system). Signs include: Trouble breathing or shortness of breath. Swelling of the face or feeling warm  (flushed). Fever or chills. Head, chest, or back pain. Dark pee (urine) or blood in the pee. Widespread rash. Fast heartbeat. Feeling dizzy or light-headed. You may receive your blood transfusion in an outpatient setting. If so, youwill be told whom to contact to report any reactions. These symptoms may be an emergency. Do not wait to see if the symptoms will go away. Get medical help right away. Call your local emergency services (911 in the [place] Do not drive yourself to the hospital. Summary Bruising and soreness at the IV site are common. Check your insertion site every day for signs of infection. Rest as told by your doctor. Go back to your normal activities as told by your doctor. Get help right away if you have signs of a serious reaction. This information is not intended to replace advice given to you by your health care provider. Make sure you discuss any questions you have with your healthcare provider. Document Revised: 09/26/2018 Document Reviewed: 09/26/2018 Elsevier Patient Education  [place].  Romiplostim injection What is this medication? ROMIPLOSTIM (roe mi PLOE stim) helps your body make more platelets. This medicine is used to treat low platelets caused by chronic idiopathic thrombocytopenic purpura (ITP) or a bone marrow syndrome caused by radiationsickness. This medicine may be used for other purposes; ask your health care provider orpharmacist if you have questions. COMMON BRAND NAME(S): Nplate What should I tell my care team before I take this medication? They need to know if you have any of these conditions: blood clots myelodysplastic syndrome an unusual or  allergic reaction to romiplostim, mannitol, other medicines, foods, dyes, or preservatives pregnant or trying to get pregnant breast-feeding How should I use this medication? This medicine is injected under the skin. It is given by a health care providerin a hospital or clinic setting. A special  MedGuide will be given to you before each treatment. Be sure to readthis information carefully each time. Talk to your health care provider about the use of this medicine in children. While it may be prescribed for children as young as newborns for selectedconditions, precautions do apply. Overdosage: If you think you have taken too much of this medicine contact apoison control center or emergency room at once. NOTE: This medicine is only for you. Do not share this medicine with others. What if I miss a dose? Keep appointments for follow-up doses. It is important not to miss your dose.Call your health care provider if you are unable to keep an appointment. What may interact with this medication? Interactions are not expected. This list may not describe all possible interactions. Give your health care provider a list of all the medicines, herbs, non-prescription drugs, or dietary supplements you use. Also tell them if you smoke, drink alcohol, or use illegaldrugs. Some items may interact with your medicine. What should I watch for while using this medication? Visit your health care provider for regular checks on your progress. You may need blood work done while you are taking this medicine. Your condition will be monitored carefully while you are receiving this medicine. It is important notto miss any appointments. What side effects may I notice from receiving this medication? Side effects that you should report to your doctor or health care professionalas soon as possible: allergic reactions (skin rash, itching or hives; swelling of the face, lips, or tongue) bleeding (bloody or black, tarry stools; red or dark brown urine; spitting up blood or brown material that looks like coffee grounds; red spots on the skin; unusual bruising or bleeding from the eyes, gums, or nose) blood clot (chest pain; shortness of breath; pain, swelling, or warmth in the leg) stroke (changes in vision; confusion; trouble  speaking or understanding; severe headaches; sudden numbness or weakness of the face, arm or leg; trouble walking; dizziness; loss of balance or coordination) Side effects that usually do not require medical attention (report to yourdoctor or health care professional if they continue or are bothersome): diarrhea dizziness headache joint pain muscle pain stomach pain trouble sleeping This list may not describe all possible side effects. Call your doctor for medical advice about side effects. You may report side effects to FDA at1-800-FDA-1088. Where should I keep my medication? This medicine is given in a hospital or clinic. It will not be stored at home. NOTE: This sheet is a summary. It may not cover all possible information. If you have questions about this medicine, talk to your doctor, pharmacist, orhealth care provider.  2022 Elsevier/Gold Standard (2019-05-19 10:28:13)

## 2020-09-29 LAB — TYPE AND SCREEN
ABO/RH(D): AB POS
Antibody Screen: NEGATIVE
Unit division: 0

## 2020-09-29 LAB — BPAM RBC
Blood Product Expiration Date: 202207072359
ISSUE DATE / TIME: 202206141621
Unit Type and Rh: 6200

## 2020-10-01 ENCOUNTER — Emergency Department (HOSPITAL_COMMUNITY): Payer: 59

## 2020-10-01 ENCOUNTER — Other Ambulatory Visit: Payer: Self-pay

## 2020-10-01 ENCOUNTER — Encounter (HOSPITAL_COMMUNITY): Payer: Self-pay

## 2020-10-01 ENCOUNTER — Emergency Department (HOSPITAL_COMMUNITY)
Admission: EM | Admit: 2020-10-01 | Discharge: 2020-10-01 | Disposition: A | Discharge status: Home / Self Care | Payer: 59 | Attending: Emergency Medicine | Admitting: Emergency Medicine

## 2020-10-01 DIAGNOSIS — R0602 Shortness of breath: Secondary | ICD-10-CM | POA: Diagnosis not present

## 2020-10-01 DIAGNOSIS — Z794 Long term (current) use of insulin: Secondary | ICD-10-CM | POA: Insufficient documentation

## 2020-10-01 DIAGNOSIS — E119 Type 2 diabetes mellitus without complications: Secondary | ICD-10-CM | POA: Insufficient documentation

## 2020-10-01 DIAGNOSIS — I110 Hypertensive heart disease with heart failure: Secondary | ICD-10-CM | POA: Diagnosis not present

## 2020-10-01 DIAGNOSIS — I5032 Chronic diastolic (congestive) heart failure: Secondary | ICD-10-CM | POA: Insufficient documentation

## 2020-10-01 DIAGNOSIS — R0600 Dyspnea, unspecified: Secondary | ICD-10-CM

## 2020-10-01 DIAGNOSIS — R0789 Other chest pain: Secondary | ICD-10-CM | POA: Diagnosis not present

## 2020-10-01 DIAGNOSIS — Z7984 Long term (current) use of oral hypoglycemic drugs: Secondary | ICD-10-CM | POA: Diagnosis not present

## 2020-10-01 DIAGNOSIS — R42 Dizziness and giddiness: Secondary | ICD-10-CM | POA: Insufficient documentation

## 2020-10-01 DIAGNOSIS — Z79899 Other long term (current) drug therapy: Secondary | ICD-10-CM | POA: Insufficient documentation

## 2020-10-01 LAB — COMPREHENSIVE METABOLIC PANEL
ALT: 8 U/L (ref 0–44)
AST: 9 U/L — ABNORMAL LOW (ref 15–41)
Albumin: 2.8 g/dL — ABNORMAL LOW (ref 3.5–5.0)
Alkaline Phosphatase: 93 U/L (ref 38–126)
Anion gap: 4 — ABNORMAL LOW (ref 5–15)
BUN: 23 mg/dL (ref 8–23)
CO2: 27 mmol/L (ref 22–32)
Calcium: 9.1 mg/dL (ref 8.9–10.3)
Chloride: 104 mmol/L (ref 98–111)
Creatinine, Ser: 0.65 mg/dL (ref 0.44–1.00)
GFR, Estimated: >60 mL/min (ref >60)
Glucose, Bld: 102 mg/dL — ABNORMAL HIGH (ref 70–99)
Potassium: 4.4 mmol/L (ref 3.5–5.1)
Sodium: 135 mmol/L (ref 135–145)
Total Bilirubin: 1.7 mg/dL — ABNORMAL HIGH (ref 0.3–1.2)
Total Protein: 5.7 g/dL — ABNORMAL LOW (ref 6.5–8.1)

## 2020-10-01 LAB — CBC WITH DIFFERENTIAL/PLATELET
Band Neutrophils: 0 %
Basophils Relative: 0 %
Blasts: NONE SEEN %
Eosinophils Relative: 0 %
HCT: 33.1 % — ABNORMAL LOW (ref 36.0–46.0)
Hemoglobin: 10.4 g/dL — ABNORMAL LOW (ref 12.0–15.0)
Lymphocytes Relative: 50 %
MCH: 30 pg (ref 26.0–34.0)
MCHC: 31.4 g/dL (ref 30.0–36.0)
MCV: 95.4 fL (ref 80.0–100.0)
Metamyelocytes Relative: NONE SEEN %
Monocytes Relative: 13 %
Myelocytes: NONE SEEN %
Neutrophils Relative %: 37 %
Platelets: 11 10*3/uL — CL (ref 150–400)
Promyelocytes Relative: NONE SEEN %
RBC: 3.47 MIL/uL — ABNORMAL LOW (ref 3.87–5.11)
RDW: 17.5 % — ABNORMAL HIGH (ref 11.5–15.5)
WBC Morphology: REACTIVE
WBC: 2.3 10*3/uL — ABNORMAL LOW (ref 4.0–10.5)
nRBC: 28 /100 WBC — ABNORMAL HIGH

## 2020-10-01 LAB — BRAIN NATRIURETIC PEPTIDE: B Natriuretic Peptide: 189.6 pg/mL — ABNORMAL HIGH (ref 0.0–100.0)

## 2020-10-01 LAB — TYPE AND SCREEN
ABO/RH(D): AB POS
Antibody Screen: NEGATIVE

## 2020-10-01 LAB — TROPONIN I (HIGH SENSITIVITY): Troponin I (High Sensitivity): 3 ng/L (ref <18)

## 2020-10-01 LAB — LACTIC ACID, PLASMA: Lactic Acid, Venous: 1.2 mmol/L (ref 0.5–1.9)

## 2020-10-01 MED ORDER — METHOCARBAMOL 500 MG PO TABS
500.0000 mg | ORAL_TABLET | Freq: Once | ORAL | Status: AC
  Administered 2020-10-01: 500 mg via ORAL
  Filled 2020-10-01: qty 1

## 2020-10-01 NOTE — ED Notes (Signed)
This RN looked for veins with [place]. Didn't see anything to access. Did not stick patient. Will consult IV team.

## 2020-10-01 NOTE — ED Provider Notes (Signed)
Emergency Medicine Provider Triage Evaluation Note  Angelique Chevalier , a 70 y.o. female  was evaluated in triage.  Pt complains of SOB and chest tightness x 1 week. Wears oxygen at home at baseline.  Denies fever, chills, congestion, rhinorrhea, cough, abdominal pain.  Denies PND orthopnea.  Recent right heart cath which showed heart failure, pulmonary hypertension Review of Systems  Positive: Chest tightness, shortness of breath Negative: Back pain, abdominal pain, PND, orthopnea  Physical Exam  BP (!) 100/50 (BP Location: Left Arm)   Pulse 85   Temp 98.3 F (36.8 C) (Oral)   Resp 18   SpO2 100%  Gen:   Awake, no distress   Resp:  Normal effort  MSK:   Moves extremities without difficulty  Other:    Medical Decision Making  Medically screening exam initiated at 5:15 PM.  Appropriate orders placed.  Jazlynn Nemetz was informed that the remainder of the evaluation will be completed by another provider, this initial triage assessment does not replace that evaluation, and the importance of remaining in the ED until their evaluation is complete.  SOB, CP   Orel Hord A, PA-C 10/01/20 1716    Daleen Bo, MD 10/02/20 1200

## 2020-10-01 NOTE — ED Triage Notes (Signed)
Pt reports increased SHOB x1 week. Pt reports being on 3L O2 at all times. Pt denies chest pain, cough, fever, and congestion.

## 2020-10-01 NOTE — Discharge Instructions (Addendum)
Follow-up at the cancer center

## 2020-10-01 NOTE — ED Provider Notes (Signed)
[facility] DEPT Provider Note   CSN: [id number] Arrival date & time: 10/01/20  1641     History Chief Complaint  Patient presents with   Shortness of Breath    Kathy Irwin is a 70 y.o. female.  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  Patient also notes she gets dizzy when she stands up.  She denies any fever or chills.  Chronically uses 2 L of oxygen at home and this is unchanged.  No vomiting or diarrhea.  Patient had hemoglobin according to the old chart of 6 which was 3 days ago.  She had been transfused with 2 units of blood a week before that.  Denies any history of blood loss.      Past Medical History:  Diagnosis Date   Acute respiratory failure with hypoxia ([place]) 12/2018   Anemia, unspecified    CHF (congestive heart failure) (HCC)    CML (chronic myelocytic leukemia) ([place])    Diabetes mellitus without complication ([place])    Esophageal reflux    Hyperlipemia    Hypersensitivity pneumonitis ([place]) 12/19/2017   Hypertension    ILD (interstitial lung disease) ([place]) 12/24/2018    Patient Active Problem List   Diagnosis Date Noted   Symptomatic anemia    Counseling regarding advance care planning and goals of care    Pyogenic arthritis of right hip (HCC)    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[place])    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   Hyperbilirubinemia 07/05/2020   Idiopathic thrombocytopenia ([place]) 07/05/2020   Pre-op testing 07/02/2020   Postoperative wound infection of right hip 06/29/2020   Closed right hip fracture ([place]) 04/29/2020   Closed intertrochanteric fracture of hip, right, initial encounter ([place]) 04/29/2020   Fall 04/29/2020   Transient hypotension 04/29/2020   Cellulitis of right lower leg 11/09/2019   Chronic diastolic CHF (congestive heart failure) ([place]) 08/02/2019   Atherosclerosis of aorta ([place])  01/23/2019   Obesity, Class III, BMI 40-49.9 (morbid obesity) ([place]) 12/24/2018   Hypersensitivity pneumonitis ([place]) 12/19/2017   Thrombocytopenia ([place]) 12/19/2017   Insulin-requiring or dependent type II diabetes mellitus ([place]) 12/17/2017   HTN (hypertension) 12/17/2017   Essential hypertension 11/05/2017   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[place]) 11/05/2017    Past Surgical History:  Procedure Laterality Date   ABDOMINAL HYSTERECTOMY     INTRAMEDULLARY (IM) NAIL INTERTROCHANTERIC Right 04/30/2020   Procedure: INTRAMEDULLARY (IM) NAIL INTERTROCHANTRIC;  Surgeon: Shona Needles, MD;  Location: [facility];  Service: Orthopedics;  Laterality: Right;   RIGHT HEART CATH N/A 09/17/2020   Procedure: RIGHT HEART CATH;  Surgeon: Larey Dresser, MD;  Location: [facility];  Service: Cardiovascular;  Laterality: N/A;   VIDEO BRONCHOSCOPY Bilateral 04/02/2019   Procedure: VIDEO BRONCHOSCOPY WITH FLUORO;  Surgeon: Marshell Garfinkel, MD;  Location: [facility];  Service: Cardiopulmonary;  Laterality: Bilateral;     OB History   No obstetric history on file.     Family History  Problem Relation Age of Onset   Diabetes Other    Hypertension Other     Social History   Tobacco Use   Smoking status: Never   Smokeless tobacco: Never  Vaping Use   Vaping Use: Never used  Substance Use Topics   Alcohol use: Never   Drug use: Never    Home Medications Prior to Admission medications   Medication Sig Start Date End Date Taking? Authorizing Provider  ACCU-CHEK GUIDE test strip TEST UP TO 4 TIMES A DAY 08/24/20   Kerin Perna, NP  Accu-Chek Softclix Lancets lancets TEST UP TO 4 TIMES A DAY 08/24/20   Kerin Perna, NP  acetaminophen (TYLENOL) 500 MG tablet Take 1,000 mg by mouth every 6 (six) hours as needed for moderate pain.    [provider]  albuterol (VENTOLIN HFA) 108 (90 Base) MCG/ACT inhaler INHALE 1-2 PUFFS BY MOUTH EVERY 6 HOURS AS NEEDED FOR WHEEZE OR SHORTNESS OF  BREATH Patient taking differently: Inhale 1-2 puffs into the lungs every 6 (six) hours as needed for wheezing or shortness of breath. 05/09/20   Kerin Perna, NP  BD PEN NEEDLE NANO 2ND GEN 32G X 4 MM MISC USE WITH LANTUS' 07/04/20   [provider]  blood glucose meter kit and supplies KIT Dispense based on patient and insurance preference. Use up to four times daily as directed. (FOR ICD-9 250.00, 250.01). For QAC - HS accuchecks. 11/22/18   Thurnell Lose, MD  [facility] TUSSIN DM 200-20 MG/10ML liquid Take 5 mLs by mouth every 4 (four) hours as needed for cough. 07/22/20   [provider]  D-5000 125 MCG (5000 UT) TABS Take 5,000 Units by mouth daily. 07/04/20   [provider]  fluticasone (FLONASE) 50 MCG/ACT nasal spray PLACE 2 SPRAYS INTO BOTH NOSTRILS DAILY AS NEEDED FOR ALLERGIES OR RHINITIS. 07/04/20   Kerin Perna, NP  furosemide (LASIX) 20 MG tablet Take 2 tablets (40 mg total) by mouth daily. Patient taking differently: Take 20-40 mg by mouth See admin instructions. Take 40 mg by mouth in the morning and 20 mg at night 08/24/20   Larey Dresser, MD  HYDROcodone-acetaminophen [facility]) 10-325 MG tablet Take 1 tablet by mouth every 6 (six) hours as needed. 09/21/20   Orson Slick, MD  linaclotide [facility]) 145 MCG CAPS capsule Take 1 capsule (145 mcg total) by mouth daily before breakfast. 08/31/20   Kerin Perna, NP  metFORMIN (GLUCOPHAGE) 1000 MG tablet Take 1,000 mg by mouth in the morning and at bedtime. 08/13/20   [provider]  metoprolol succinate (TOPROL-XL) 25 MG 24 hr tablet Take 1 tablet (25 mg total) by mouth daily. 08/31/20   Kerin Perna, NP  Multiple Vitamin (MULTIVITAMIN WITH MINERALS) TABS tablet Take 1 tablet by mouth daily. 12/02/19   Pokhrel, Corrie Mckusick, MD  olopatadine (PATANOL) 0.1 % ophthalmic solution Place 1 drop into both eyes 2 (two) times daily. 07/24/19   Wieters, Hallie C, PA-C  ondansetron (ZOFRAN) 8 MG tablet  Take 1 tablet (8 mg total) by mouth every 8 (eight) hours as needed for nausea. 09/15/20   Brunetta Genera, MD  OXYGEN Inhale 3 L into the lungs continuous.    [provider]  pregabalin (LYRICA) 100 MG capsule Take 100 mg by mouth at bedtime.    [provider]  senna-docusate (SENOKOT-S) 8.6-50 MG tablet Take 1 tablet by mouth 2 (two) times daily. 07/21/20 10/19/20  Terrilee Croak, MD  sertraline (ZOLOFT) 100 MG tablet TAKE 1 TABLET BY MOUTH AT BEDTIME Patient taking differently: Take 100 mg by mouth at bedtime. 08/31/20   Kerin Perna, NP  simethicone (MYLICON) 80 MG chewable tablet Chew 1 tablet (80 mg total) by mouth 4 (four) times daily as needed for flatulence. 12/01/19   Pokhrel, Corrie Mckusick, MD  spironolactone (ALDACTONE) 25 MG tablet Take 0.5 tablets (12.5 mg total) by mouth daily. 08/24/20   Aundra Dubin,  Elby Showers, MD  triamcinolone cream (KENALOG) 0.1 % Apply 1 application topically every 6 (six) hours as needed (raw skin). 07/04/20   [provider]    Allergies    Other, Ativan [lorazepam], Iodine, Iodine i 131 tositumomab, Merbromin, and Tape  Review of Systems   Review of Systems  All other systems reviewed and are negative.  Physical Exam Updated Vital Signs BP (!) 100/54   Pulse 89   Temp 98.3 F (36.8 C) (Oral)   Resp 18   SpO2 100%   Physical Exam Vitals and nursing note reviewed.  Constitutional:      General: She is not in acute distress.    Appearance: Normal appearance. She is well-developed. She is not toxic-appearing.  HENT:     Head: Normocephalic and atraumatic.  Eyes:     General: Lids are normal.     Conjunctiva/sclera: Conjunctivae normal.     Pupils: Pupils are equal, round, and reactive to light.  Neck:     Thyroid: No thyroid mass.     Trachea: No tracheal deviation.  Cardiovascular:     Rate and Rhythm: Normal rate and regular rhythm.     Heart sounds: Normal heart sounds. No murmur heard.   No gallop.  Pulmonary:      Effort: Pulmonary effort is normal. No respiratory distress.     Breath sounds: No stridor. Decreased breath sounds present. No wheezing, rhonchi or rales.  Abdominal:     General: There is no distension.     Palpations: Abdomen is soft.     Tenderness: There is no abdominal tenderness. There is no rebound.  Musculoskeletal:        General: No tenderness. Normal range of motion.     Cervical back: Normal range of motion and neck supple.  Skin:    General: Skin is warm and dry.     Findings: No abrasion or rash.  Neurological:     Mental Status: She is alert and oriented to person, place, and time. Mental status is at baseline.     GCS: GCS eye subscore is 4. GCS verbal subscore is 5. GCS motor subscore is 6.     Cranial Nerves: Cranial nerves are intact. No cranial nerve deficit.     Sensory: No sensory deficit.     Motor: Motor function is intact.  Psychiatric:        Attention and Perception: Attention normal.        Speech: Speech normal.        Behavior: Behavior normal.    ED Results / Procedures / Treatments   Labs (all labs ordered are listed, but only abnormal results are displayed) Labs Reviewed  CULTURE, BLOOD (ROUTINE X 2)  CULTURE, BLOOD (ROUTINE X 2)  CBC WITH DIFFERENTIAL/PLATELET  COMPREHENSIVE METABOLIC PANEL  BRAIN NATRIURETIC PEPTIDE  LACTIC ACID, PLASMA  TYPE AND SCREEN  TROPONIN I (HIGH SENSITIVITY)  TROPONIN I (HIGH SENSITIVITY)    EKG None  Radiology DG Chest 2 View  Result Date: 10/01/2020 CLINICAL DATA:  Short of breath.  Cough and fever EXAM: CHEST - 2 VIEW COMPARISON:  09/01/2020 FINDINGS: Bibasilar airspace disease unchanged from the prior study. Upper lobes remain clear. No effusion. IMPRESSION: Bibasilar infiltrates unchanged from 09/01/2020. Possible atelectasis or persistent pneumonia. No effusion. Electronically Signed   By: Franchot Gallo M.D.   On: 10/01/2020 17:37    Procedures Procedures   Medications Ordered in ED Medications -  No data to display  ED Course  I have reviewed the triage vital signs and the nursing notes.  Pertinent labs & imaging results that were available during my care of the patient were reviewed by me and considered in my medical decision making (see chart for details).    MDM Rules/Calculators/A&P                          Had long discussion with patient concerning her symptoms.  She states that she gets short of breath when she stands up and begins to panic and then takes off her oxygen and then puts it back on.  States that she is really concerned about her upcoming chemotherapy and states she has been more anxious.  Work-up here is reassuring with hemoglobin of 10.4.  Platelets are chronically low but unchanged.  Chest x-ray without new findings.  Patient comfortable going home and will follow up in the cancer center in 3 days Final Clinical Impression(s) / ED Diagnoses Final diagnoses:  None    Rx / DC Orders ED Discharge Orders     None        Lacretia Leigh, MD 10/01/20 2250

## 2020-10-04 ENCOUNTER — Other Ambulatory Visit: Payer: Self-pay

## 2020-10-04 ENCOUNTER — Emergency Department (HOSPITAL_COMMUNITY): Payer: 59

## 2020-10-04 ENCOUNTER — Inpatient Hospital Stay (HOSPITAL_COMMUNITY)
Admission: EM | Admit: 2020-10-04 | Discharge: 2020-10-28 | DRG: 840 | Disposition: A | Discharge status: Other Acute Inpatient Hospital | Payer: 59 | Attending: Internal Medicine | Admitting: Internal Medicine

## 2020-10-04 ENCOUNTER — Encounter (HOSPITAL_COMMUNITY): Payer: Self-pay | Admitting: Emergency Medicine

## 2020-10-04 ENCOUNTER — Other Ambulatory Visit: Payer: Self-pay | Admitting: Physician Assistant

## 2020-10-04 ENCOUNTER — Telehealth: Payer: Self-pay | Admitting: *Deleted

## 2020-10-04 DIAGNOSIS — Z833 Family history of diabetes mellitus: Secondary | ICD-10-CM

## 2020-10-04 DIAGNOSIS — J9621 Acute and chronic respiratory failure with hypoxia: Secondary | ICD-10-CM

## 2020-10-04 DIAGNOSIS — D61810 Antineoplastic chemotherapy induced pancytopenia: Secondary | ICD-10-CM | POA: Diagnosis present

## 2020-10-04 DIAGNOSIS — K219 Gastro-esophageal reflux disease without esophagitis: Secondary | ICD-10-CM | POA: Diagnosis present

## 2020-10-04 DIAGNOSIS — T451X5A Adverse effect of antineoplastic and immunosuppressive drugs, initial encounter: Secondary | ICD-10-CM | POA: Diagnosis present

## 2020-10-04 DIAGNOSIS — E669 Obesity, unspecified: Secondary | ICD-10-CM | POA: Diagnosis present

## 2020-10-04 DIAGNOSIS — C9310 Chronic myelomonocytic leukemia not having achieved remission: Secondary | ICD-10-CM

## 2020-10-04 DIAGNOSIS — Z888 Allergy status to other drugs, medicaments and biological substances status: Secondary | ICD-10-CM

## 2020-10-04 DIAGNOSIS — Z7984 Long term (current) use of oral hypoglycemic drugs: Secondary | ICD-10-CM

## 2020-10-04 DIAGNOSIS — E785 Hyperlipidemia, unspecified: Secondary | ICD-10-CM | POA: Diagnosis present

## 2020-10-04 DIAGNOSIS — F419 Anxiety disorder, unspecified: Secondary | ICD-10-CM | POA: Diagnosis present

## 2020-10-04 DIAGNOSIS — J8410 Pulmonary fibrosis, unspecified: Secondary | ICD-10-CM | POA: Diagnosis present

## 2020-10-04 DIAGNOSIS — G8929 Other chronic pain: Secondary | ICD-10-CM | POA: Diagnosis present

## 2020-10-04 DIAGNOSIS — R000 Tachycardia, unspecified: Secondary | ICD-10-CM

## 2020-10-04 DIAGNOSIS — Z20822 Contact with and (suspected) exposure to covid-19: Secondary | ICD-10-CM | POA: Diagnosis present

## 2020-10-04 DIAGNOSIS — I10 Essential (primary) hypertension: Secondary | ICD-10-CM | POA: Diagnosis present

## 2020-10-04 DIAGNOSIS — Y95 Nosocomial condition: Secondary | ICD-10-CM | POA: Diagnosis present

## 2020-10-04 DIAGNOSIS — N179 Acute kidney failure, unspecified: Secondary | ICD-10-CM | POA: Diagnosis present

## 2020-10-04 DIAGNOSIS — E118 Type 2 diabetes mellitus with unspecified complications: Secondary | ICD-10-CM | POA: Diagnosis present

## 2020-10-04 DIAGNOSIS — D689 Coagulation defect, unspecified: Secondary | ICD-10-CM | POA: Diagnosis present

## 2020-10-04 DIAGNOSIS — J189 Pneumonia, unspecified organism: Secondary | ICD-10-CM | POA: Clinically undetermined

## 2020-10-04 DIAGNOSIS — R0602 Shortness of breath: Secondary | ICD-10-CM

## 2020-10-04 DIAGNOSIS — R509 Fever, unspecified: Secondary | ICD-10-CM

## 2020-10-04 DIAGNOSIS — R161 Splenomegaly, not elsewhere classified: Secondary | ICD-10-CM | POA: Diagnosis present

## 2020-10-04 DIAGNOSIS — I5032 Chronic diastolic (congestive) heart failure: Secondary | ICD-10-CM | POA: Diagnosis present

## 2020-10-04 DIAGNOSIS — Z883 Allergy status to other anti-infective agents status: Secondary | ICD-10-CM

## 2020-10-04 DIAGNOSIS — D6959 Other secondary thrombocytopenia: Secondary | ICD-10-CM | POA: Diagnosis present

## 2020-10-04 DIAGNOSIS — Z885 Allergy status to narcotic agent status: Secondary | ICD-10-CM

## 2020-10-04 DIAGNOSIS — Z23 Encounter for immunization: Secondary | ICD-10-CM

## 2020-10-04 DIAGNOSIS — Z9109 Other allergy status, other than to drugs and biological substances: Secondary | ICD-10-CM

## 2020-10-04 DIAGNOSIS — E1165 Type 2 diabetes mellitus with hyperglycemia: Secondary | ICD-10-CM | POA: Diagnosis present

## 2020-10-04 DIAGNOSIS — Z90710 Acquired absence of both cervix and uterus: Secondary | ICD-10-CM

## 2020-10-04 DIAGNOSIS — T380X5A Adverse effect of glucocorticoids and synthetic analogues, initial encounter: Secondary | ICD-10-CM | POA: Diagnosis present

## 2020-10-04 DIAGNOSIS — R7989 Other specified abnormal findings of blood chemistry: Secondary | ICD-10-CM

## 2020-10-04 DIAGNOSIS — Z6837 Body mass index (BMI) 37.0-37.9, adult: Secondary | ICD-10-CM

## 2020-10-04 DIAGNOSIS — Z79899 Other long term (current) drug therapy: Secondary | ICD-10-CM

## 2020-10-04 DIAGNOSIS — D61818 Other pancytopenia: Secondary | ICD-10-CM | POA: Diagnosis present

## 2020-10-04 DIAGNOSIS — Z9981 Dependence on supplemental oxygen: Secondary | ICD-10-CM

## 2020-10-04 DIAGNOSIS — I110 Hypertensive heart disease with heart failure: Secondary | ICD-10-CM | POA: Diagnosis present

## 2020-10-04 DIAGNOSIS — D693 Immune thrombocytopenic purpura: Secondary | ICD-10-CM | POA: Diagnosis present

## 2020-10-04 DIAGNOSIS — Z8249 Family history of ischemic heart disease and other diseases of the circulatory system: Secondary | ICD-10-CM

## 2020-10-04 DIAGNOSIS — D649 Anemia, unspecified: Secondary | ICD-10-CM | POA: Diagnosis present

## 2020-10-04 LAB — COMPREHENSIVE METABOLIC PANEL
ALT: 10 U/L (ref 0–44)
AST: 11 U/L — ABNORMAL LOW (ref 15–41)
Albumin: 2.8 g/dL — ABNORMAL LOW (ref 3.5–5.0)
Alkaline Phosphatase: 93 U/L (ref 38–126)
Anion gap: 7 (ref 5–15)
BUN: 24 mg/dL — ABNORMAL HIGH (ref 8–23)
CO2: 24 mmol/L (ref 22–32)
Calcium: 9.5 mg/dL (ref 8.9–10.3)
Chloride: 105 mmol/L (ref 98–111)
Creatinine, Ser: 0.76 mg/dL (ref 0.44–1.00)
GFR, Estimated: >60 mL/min (ref >60)
Glucose, Bld: 193 mg/dL — ABNORMAL HIGH (ref 70–99)
Potassium: 4.7 mmol/L (ref 3.5–5.1)
Sodium: 136 mmol/L (ref 135–145)
Total Bilirubin: 2.3 mg/dL — ABNORMAL HIGH (ref 0.3–1.2)
Total Protein: 5.7 g/dL — ABNORMAL LOW (ref 6.5–8.1)

## 2020-10-04 LAB — BRAIN NATRIURETIC PEPTIDE: B Natriuretic Peptide: 500.3 pg/mL — ABNORMAL HIGH (ref 0.0–100.0)

## 2020-10-04 LAB — CBC WITH DIFFERENTIAL/PLATELET
Abs Immature Granulocytes: 0.24 10*3/uL — ABNORMAL HIGH (ref 0.00–0.07)
Basophils Absolute: 0 10*3/uL (ref 0.0–0.1)
Basophils Relative: 0 %
Eosinophils Absolute: 0 10*3/uL (ref 0.0–0.5)
Eosinophils Relative: 0 %
HCT: 16.1 % — ABNORMAL LOW (ref 36.0–46.0)
Hemoglobin: 5.1 g/dL — CL (ref 12.0–15.0)
Immature Granulocytes: 4 %
Lymphocytes Relative: 44 %
Lymphs Abs: 2.7 10*3/uL (ref 0.7–4.0)
MCH: 30.4 pg (ref 26.0–34.0)
MCHC: 31.7 g/dL (ref 30.0–36.0)
MCV: 95.8 fL (ref 80.0–100.0)
Monocytes Absolute: 1.7 10*3/uL — ABNORMAL HIGH (ref 0.1–1.0)
Monocytes Relative: 28 %
Neutro Abs: 1.5 10*3/uL — ABNORMAL LOW (ref 1.7–7.7)
Neutrophils Relative %: 24 %
Platelets: 19 10*3/uL — CL (ref 150–400)
RBC: 1.68 MIL/uL — ABNORMAL LOW (ref 3.87–5.11)
RDW: 17.5 % — ABNORMAL HIGH (ref 11.5–15.5)
WBC: 6.1 10*3/uL (ref 4.0–10.5)
nRBC: 7.4 % — ABNORMAL HIGH (ref 0.0–0.2)

## 2020-10-04 LAB — TROPONIN I (HIGH SENSITIVITY)
Troponin I (High Sensitivity): 5 ng/L (ref <18)
Troponin I (High Sensitivity): 5 ng/L (ref <18)

## 2020-10-04 NOTE — Telephone Encounter (Signed)
Returned PC to patient, patient states she is having a fast heart rate & is very short of breath, she cannot stand for very long at all.  Per Lyanne Co, NP patient advised to go to ED now for these sx's.  Patient verbalizes understanding.

## 2020-10-04 NOTE — ED Triage Notes (Signed)
Pt reports sob, congestion, ear pain and generalized weakness x1 week. Pt wears 3L [place] at baseline. Pt reports 6 months ago she broke her hip and hasn't been able to walk since. Pt uses a roll-aid at home.

## 2020-10-04 NOTE — ED Provider Notes (Signed)
Emergency Medicine Provider Triage Evaluation Note  Kathy Irwin , a 70 y.o. female  was evaluated in triage.  Pt complains of shortness of breath, chest tightness. She was seen on the 17th at [facility] for the same thing.  She states that when she stands up she gets dizzy.  When she feels dizzy she then feels the need to take her oxygen off. She says that she feels better with the oxygen off however also notes that she sits down to take the oxygen off.  Review of Systems  Positive: Lightheadedness, anxiety Negative: Fevers  Physical Exam  There were no vitals taken for this visit. Gen:   Awake, no distress   Resp:  Normal effort  MSK:   Moves extremities without difficulty  Other:  Patient is awake and alert, able to answer questions correctly.   Medical Decision Making  Medically screening exam initiated at 5:00 PM.  Appropriate orders placed.  [name] was informed that the remainder of the evaluation will be completed by another provider, this initial triage assessment does not replace that evaluation, and the importance of remaining in the ED until their evaluation is complete.  Note: Portions of this report may have been transcribed using voice recognition software. Every effort was made to ensure accuracy; however, inadvertent computerized transcription errors may be present    Ollen Gross 10/04/20 1704    Daleen Bo, MD 10/05/20 [id number]

## 2020-10-05 ENCOUNTER — Inpatient Hospital Stay (HOSPITAL_COMMUNITY): Payer: 59

## 2020-10-05 ENCOUNTER — Inpatient Hospital Stay: Payer: 59 | Admitting: Physician Assistant

## 2020-10-05 ENCOUNTER — Inpatient Hospital Stay: Payer: 59

## 2020-10-05 DIAGNOSIS — D689 Coagulation defect, unspecified: Secondary | ICD-10-CM | POA: Diagnosis present

## 2020-10-05 DIAGNOSIS — Z6837 Body mass index (BMI) 37.0-37.9, adult: Secondary | ICD-10-CM | POA: Diagnosis not present

## 2020-10-05 DIAGNOSIS — C9310 Chronic myelomonocytic leukemia not having achieved remission: Principal | ICD-10-CM

## 2020-10-05 DIAGNOSIS — I110 Hypertensive heart disease with heart failure: Secondary | ICD-10-CM | POA: Diagnosis present

## 2020-10-05 DIAGNOSIS — T451X5A Adverse effect of antineoplastic and immunosuppressive drugs, initial encounter: Secondary | ICD-10-CM | POA: Diagnosis present

## 2020-10-05 DIAGNOSIS — D61818 Other pancytopenia: Secondary | ICD-10-CM | POA: Diagnosis present

## 2020-10-05 DIAGNOSIS — C9110 Chronic lymphocytic leukemia of B-cell type not having achieved remission: Secondary | ICD-10-CM | POA: Diagnosis not present

## 2020-10-05 DIAGNOSIS — J8410 Pulmonary fibrosis, unspecified: Secondary | ICD-10-CM | POA: Diagnosis present

## 2020-10-05 DIAGNOSIS — E669 Obesity, unspecified: Secondary | ICD-10-CM

## 2020-10-05 DIAGNOSIS — D693 Immune thrombocytopenic purpura: Secondary | ICD-10-CM

## 2020-10-05 DIAGNOSIS — I5032 Chronic diastolic (congestive) heart failure: Secondary | ICD-10-CM | POA: Diagnosis present

## 2020-10-05 DIAGNOSIS — I10 Essential (primary) hypertension: Secondary | ICD-10-CM | POA: Diagnosis not present

## 2020-10-05 DIAGNOSIS — Z885 Allergy status to narcotic agent status: Secondary | ICD-10-CM | POA: Diagnosis not present

## 2020-10-05 DIAGNOSIS — D649 Anemia, unspecified: Secondary | ICD-10-CM

## 2020-10-05 DIAGNOSIS — D6959 Other secondary thrombocytopenia: Secondary | ICD-10-CM | POA: Diagnosis present

## 2020-10-05 DIAGNOSIS — E118 Type 2 diabetes mellitus with unspecified complications: Secondary | ICD-10-CM

## 2020-10-05 DIAGNOSIS — D61810 Antineoplastic chemotherapy induced pancytopenia: Secondary | ICD-10-CM | POA: Diagnosis present

## 2020-10-05 DIAGNOSIS — Z7189 Other specified counseling: Secondary | ICD-10-CM | POA: Diagnosis not present

## 2020-10-05 DIAGNOSIS — J189 Pneumonia, unspecified organism: Secondary | ICD-10-CM | POA: Diagnosis present

## 2020-10-05 DIAGNOSIS — Z515 Encounter for palliative care: Secondary | ICD-10-CM | POA: Diagnosis not present

## 2020-10-05 DIAGNOSIS — Z883 Allergy status to other anti-infective agents status: Secondary | ICD-10-CM | POA: Diagnosis not present

## 2020-10-05 DIAGNOSIS — J9621 Acute and chronic respiratory failure with hypoxia: Secondary | ICD-10-CM | POA: Diagnosis present

## 2020-10-05 DIAGNOSIS — Y95 Nosocomial condition: Secondary | ICD-10-CM | POA: Diagnosis present

## 2020-10-05 DIAGNOSIS — Z20822 Contact with and (suspected) exposure to covid-19: Secondary | ICD-10-CM | POA: Diagnosis present

## 2020-10-05 DIAGNOSIS — R161 Splenomegaly, not elsewhere classified: Secondary | ICD-10-CM | POA: Diagnosis present

## 2020-10-05 DIAGNOSIS — R0602 Shortness of breath: Secondary | ICD-10-CM | POA: Diagnosis present

## 2020-10-05 DIAGNOSIS — Z888 Allergy status to other drugs, medicaments and biological substances status: Secondary | ICD-10-CM | POA: Diagnosis not present

## 2020-10-05 DIAGNOSIS — G8929 Other chronic pain: Secondary | ICD-10-CM | POA: Diagnosis present

## 2020-10-05 DIAGNOSIS — R6511 Systemic inflammatory response syndrome (SIRS) of non-infectious origin with acute organ dysfunction: Secondary | ICD-10-CM | POA: Diagnosis not present

## 2020-10-05 DIAGNOSIS — R531 Weakness: Secondary | ICD-10-CM | POA: Insufficient documentation

## 2020-10-05 DIAGNOSIS — N179 Acute kidney failure, unspecified: Secondary | ICD-10-CM | POA: Diagnosis present

## 2020-10-05 DIAGNOSIS — C9210 Chronic myeloid leukemia, BCR/ABL-positive, not having achieved remission: Secondary | ICD-10-CM | POA: Diagnosis not present

## 2020-10-05 DIAGNOSIS — Z23 Encounter for immunization: Secondary | ICD-10-CM | POA: Diagnosis not present

## 2020-10-05 DIAGNOSIS — E1165 Type 2 diabetes mellitus with hyperglycemia: Secondary | ICD-10-CM | POA: Diagnosis present

## 2020-10-05 DIAGNOSIS — T380X5A Adverse effect of glucocorticoids and synthetic analogues, initial encounter: Secondary | ICD-10-CM | POA: Diagnosis present

## 2020-10-05 DIAGNOSIS — R7989 Other specified abnormal findings of blood chemistry: Secondary | ICD-10-CM | POA: Diagnosis not present

## 2020-10-05 LAB — CBG MONITORING, ED
Glucose-Capillary: 176 mg/dL — ABNORMAL HIGH (ref 70–99)
Glucose-Capillary: 164 mg/dL — ABNORMAL HIGH (ref 70–99)

## 2020-10-05 LAB — PROCALCITONIN: Procalcitonin: 0.48 ng/mL

## 2020-10-05 LAB — PREPARE RBC (CROSSMATCH)

## 2020-10-05 LAB — PROTIME-INR
INR: 1.9 — ABNORMAL HIGH (ref 0.8–1.2)
Prothrombin Time: 21.8 seconds — ABNORMAL HIGH (ref 11.4–15.2)

## 2020-10-05 LAB — URINALYSIS, ROUTINE W REFLEX MICROSCOPIC
Bilirubin Urine: NEGATIVE
Glucose, UA: NEGATIVE mg/dL
Hgb urine dipstick: NEGATIVE
Ketones, ur: NEGATIVE mg/dL
Leukocytes,Ua: NEGATIVE
Nitrite: NEGATIVE
Protein, ur: NEGATIVE mg/dL
Specific Gravity, Urine: 1.03 (ref 1.005–1.030)
pH: 7 (ref 5.0–8.0)

## 2020-10-05 LAB — GLUCOSE, CAPILLARY: Glucose-Capillary: 169 mg/dL — ABNORMAL HIGH (ref 70–99)

## 2020-10-05 LAB — RETICULOCYTES
Immature Retic Fract: 5.2 % (ref 2.3–15.9)
RBC.: 1.92 MIL/uL — ABNORMAL LOW (ref 3.87–5.11)
Retic Count, Absolute: 33 10*3/uL (ref 19.0–186.0)
Retic Ct Pct: 1.7 % (ref 0.4–3.1)

## 2020-10-05 LAB — HEMOGLOBIN AND HEMATOCRIT, BLOOD
HCT: 23.1 % — ABNORMAL LOW (ref 36.0–46.0)
Hemoglobin: 7.7 g/dL — ABNORMAL LOW (ref 12.0–15.0)

## 2020-10-05 LAB — APTT: aPTT: 44 seconds — ABNORMAL HIGH (ref 24–36)

## 2020-10-05 LAB — LACTATE DEHYDROGENASE: LDH: 376 U/L — ABNORMAL HIGH (ref 98–192)

## 2020-10-05 LAB — SAVE SMEAR(SSMR), FOR PROVIDER SLIDE REVIEW

## 2020-10-05 LAB — SARS CORONAVIRUS 2 (TAT 6-24 HRS): SARS Coronavirus 2: NEGATIVE

## 2020-10-05 LAB — LACTIC ACID, PLASMA: Lactic Acid, Venous: 1.3 mmol/L (ref 0.5–1.9)

## 2020-10-05 MED ORDER — ROMIPLOSTIM INJECTION 500 MCG
500.0000 ug | Freq: Once | SUBCUTANEOUS | Status: AC
Start: 2020-10-05 — End: 2020-10-05
  Administered 2020-10-05: 500 ug via SUBCUTANEOUS
  Filled 2020-10-05 (×2): qty 1

## 2020-10-05 MED ORDER — ONDANSETRON HCL 4 MG/2ML IJ SOLN
4.0000 mg | Freq: Once | INTRAMUSCULAR | Status: AC
  Administered 2020-10-05: 4 mg via INTRAVENOUS
  Filled 2020-10-05: qty 2

## 2020-10-05 MED ORDER — INSULIN ASPART 100 UNIT/ML IJ SOLN
0.0000 [IU] | Freq: Three times a day (TID) | INTRAMUSCULAR | Status: DC
  Administered 2020-10-05: 2 [IU] via SUBCUTANEOUS
  Administered 2020-10-05 – 2020-10-06 (×2): 3 [IU] via SUBCUTANEOUS
  Administered 2020-10-06 (×2): 1 [IU] via SUBCUTANEOUS
  Administered 2020-10-07: 2 [IU] via SUBCUTANEOUS
  Administered 2020-10-07: 3 [IU] via SUBCUTANEOUS
  Administered 2020-10-08: 7 [IU] via SUBCUTANEOUS

## 2020-10-05 MED ORDER — METOPROLOL SUCCINATE ER 25 MG PO TB24: 25.0000 mg | ORAL_TABLET | Freq: Every morning | ORAL | Status: DC

## 2020-10-05 MED ORDER — DICLOFENAC SODIUM 1 % EX GEL
2.0000 g | Freq: Three times a day (TID) | CUTANEOUS | Status: DC
  Administered 2020-10-05 – 2020-10-28 (×94): 2 g via TOPICAL
  Filled 2020-10-05 (×3): qty 100

## 2020-10-05 MED ORDER — ACETAMINOPHEN 325 MG PO TABS
650.0000 mg | ORAL_TABLET | Freq: Four times a day (QID) | ORAL | Status: DC | PRN
  Administered 2020-10-06 – 2020-10-28 (×11): 650 mg via ORAL
  Filled 2020-10-05 (×12): qty 2

## 2020-10-05 MED ORDER — MORPHINE SULFATE (PF) 2 MG/ML IV SOLN: 2.0000 mg | INTRAVENOUS | Status: DC | PRN

## 2020-10-05 MED ORDER — HYDROCODONE-ACETAMINOPHEN 10-325 MG PO TABS
1.0000 | ORAL_TABLET | Freq: Every day | ORAL | Status: DC | PRN
  Administered 2020-10-06: 1 via ORAL
  Filled 2020-10-05: qty 1

## 2020-10-05 MED ORDER — ONDANSETRON HCL 4 MG/2ML IJ SOLN
4.0000 mg | Freq: Four times a day (QID) | INTRAMUSCULAR | Status: DC | PRN
  Administered 2020-10-06 – 2020-10-08 (×3): 4 mg via INTRAVENOUS
  Filled 2020-10-05 (×3): qty 2

## 2020-10-05 MED ORDER — DIPHENHYDRAMINE HCL 50 MG/ML IJ SOLN
25.0000 mg | Freq: Once | INTRAMUSCULAR | Status: AC
  Administered 2020-10-05: 25 mg via INTRAVENOUS
  Filled 2020-10-05: qty 1

## 2020-10-05 MED ORDER — SODIUM CHLORIDE 0.9% IV SOLUTION: Freq: Once | INTRAVENOUS | Status: DC

## 2020-10-05 MED ORDER — FUROSEMIDE 40 MG PO TABS
40.0000 mg | ORAL_TABLET | Freq: Every day | ORAL | Status: DC
  Administered 2020-10-05 – 2020-10-07 (×3): 40 mg via ORAL
  Filled 2020-10-05: qty 2
  Filled 2020-10-05 (×2): qty 1

## 2020-10-05 MED ORDER — SODIUM CHLORIDE 0.9 % IV SOLN
2.0000 g | INTRAVENOUS | Status: AC
  Administered 2020-10-05 – 2020-10-09 (×5): 2 g via INTRAVENOUS
  Filled 2020-10-05 (×6): qty 20

## 2020-10-05 MED ORDER — AZITHROMYCIN 500 MG PO TABS
500.0000 mg | ORAL_TABLET | Freq: Every day | ORAL | Status: AC
  Administered 2020-10-05 – 2020-10-09 (×5): 500 mg via ORAL
  Filled 2020-10-05 (×2): qty 1
  Filled 2020-10-05: qty 2
  Filled 2020-10-05 (×2): qty 1

## 2020-10-05 MED ORDER — IOHEXOL 350 MG/ML SOLN
100.0000 mL | Freq: Once | INTRAVENOUS | Status: AC | PRN
  Administered 2020-10-05: 100 mL via INTRAVENOUS

## 2020-10-05 MED ORDER — METOPROLOL TARTRATE 25 MG PO TABS
25.0000 mg | ORAL_TABLET | Freq: Every evening | ORAL | Status: DC
  Administered 2020-10-05 – 2020-10-19 (×14): 25 mg via ORAL
  Filled 2020-10-05 (×16): qty 1

## 2020-10-05 MED ORDER — PREGABALIN 100 MG PO CAPS
100.0000 mg | ORAL_CAPSULE | Freq: Every day | ORAL | Status: DC
  Administered 2020-10-05 – 2020-10-27 (×22): 100 mg via ORAL
  Filled 2020-10-05 (×23): qty 1

## 2020-10-05 MED ORDER — METOPROLOL TARTRATE 50 MG PO TABS
50.0000 mg | ORAL_TABLET | Freq: Every morning | ORAL | Status: DC
  Administered 2020-10-05 – 2020-10-20 (×15): 50 mg via ORAL
  Filled 2020-10-05 (×3): qty 1
  Filled 2020-10-05: qty 2
  Filled 2020-10-05 (×11): qty 1

## 2020-10-05 MED ORDER — ACETAMINOPHEN 650 MG RE SUPP: 650.0000 mg | Freq: Four times a day (QID) | RECTAL | Status: DC | PRN

## 2020-10-05 MED ORDER — LINACLOTIDE 145 MCG PO CAPS
145.0000 ug | ORAL_CAPSULE | Freq: Every day | ORAL | Status: DC
  Administered 2020-10-06 – 2020-10-27 (×17): 145 ug via ORAL
  Filled 2020-10-05 (×25): qty 1

## 2020-10-05 MED ORDER — FLUTICASONE PROPIONATE 50 MCG/ACT NA SUSP
2.0000 | Freq: Every day | NASAL | Status: DC | PRN
  Filled 2020-10-05: qty 16

## 2020-10-05 MED ORDER — PHYTONADIONE 5 MG PO TABS
10.0000 mg | ORAL_TABLET | Freq: Every day | ORAL | Status: AC
  Administered 2020-10-05 – 2020-10-07 (×3): 10 mg via ORAL
  Filled 2020-10-05 (×3): qty 2

## 2020-10-05 MED ORDER — ONDANSETRON HCL 4 MG PO TABS
4.0000 mg | ORAL_TABLET | Freq: Four times a day (QID) | ORAL | Status: DC | PRN
  Administered 2020-10-26: 4 mg via ORAL
  Filled 2020-10-05: qty 1

## 2020-10-05 MED ORDER — ALBUTEROL SULFATE (2.5 MG/3ML) 0.083% IN NEBU: 2.5000 mg | INHALATION_SOLUTION | Freq: Four times a day (QID) | RESPIRATORY_TRACT | Status: DC | PRN

## 2020-10-05 MED ORDER — OLOPATADINE HCL 0.1 % OP SOLN
1.0000 [drp] | Freq: Two times a day (BID) | OPHTHALMIC | Status: DC
  Administered 2020-10-05 – 2020-10-28 (×44): 1 [drp] via OPHTHALMIC
  Filled 2020-10-05 (×2): qty 5

## 2020-10-05 MED ORDER — SODIUM CHLORIDE 0.9% FLUSH
3.0000 mL | Freq: Two times a day (BID) | INTRAVENOUS | Status: DC
  Administered 2020-10-05 – 2020-10-28 (×37): 3 mL via INTRAVENOUS

## 2020-10-05 MED ORDER — SERTRALINE HCL 100 MG PO TABS
100.0000 mg | ORAL_TABLET | Freq: Every day | ORAL | Status: DC
  Administered 2020-10-05 – 2020-10-27 (×23): 100 mg via ORAL
  Filled 2020-10-05 (×23): qty 1

## 2020-10-05 MED ORDER — ROMIPLOSTIM INJECTION 500 MCG
5.0000 ug/kg | Freq: Once | SUBCUTANEOUS | Status: DC
  Filled 2020-10-05: qty 1.02

## 2020-10-05 MED ORDER — METHOCARBAMOL 500 MG PO TABS
500.0000 mg | ORAL_TABLET | Freq: Once | ORAL | Status: AC
  Administered 2020-10-05: 500 mg via ORAL
  Filled 2020-10-05: qty 1

## 2020-10-05 NOTE — ED Notes (Signed)
Patient transported to CT 

## 2020-10-05 NOTE — ED Notes (Signed)
Attempted x2 unsuccessfully to obtain second IV. Requesting second nurse now.

## 2020-10-05 NOTE — Plan of Care (Signed)
  Problem: Education: Goal: Knowledge of General Education information will improve Description: Including pain rating scale, medication(s)/side effects and non-pharmacologic comfort measures Outcome: Progressing   Problem: Health Behavior/Discharge Planning: Goal: Ability to manage health-related needs will improve Outcome: Progressing   Problem: Clinical Measurements: Goal: Diagnostic test results will improve Outcome: Progressing Goal: Respiratory complications will improve Outcome: Progressing   Problem: Activity: Goal: Risk for activity intolerance will decrease Outcome: Progressing   Problem: Coping: Goal: Level of anxiety will decrease Outcome: Progressing   Problem: Safety: Goal: Ability to remain free from injury will improve Outcome: Progressing

## 2020-10-05 NOTE — Progress Notes (Addendum)
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  The patient has completed 2 cycles of Vidaza.  Last cycle was given 6/6 through 6/10.  She has also had persistent thrombocytopenia related CMML and ITP.  She has also had persistent anemia secondary to CMML.  She is currently transfusion dependent.  The patient presented to the emergency department secondary to increased weakness, shortness of breath, and chest pain with standing.  Her lab work was significant for a hemoglobin of 5.1 and platelet count 19,000.  She had a CT of the chest performed which was negative for PE but did show increase in her splenomegaly.  The patient has received 2 units PRBCs so far this admission.  Also, the patient has been receiving Nplate weekly without a significant response.  Last dose of Nplate was given on 8/67/5449.  She received 1000 mcg on that date.  After receiving the blood, patient reports that she feels much better.  Her shortness of breath has improved.  She is not currently having any chest discomfort.  She currently denies bleeding.  She reports pain to her right thigh which has been present for some time.  She has no other complaints today.  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  GENERAL: Resting comfortably on the stretcher SKIN: No rashes or petechiae EYES: normal, Conjunctiva are pink and non-injected,  sclera clear OROPHARYNX: No thrush or mucositis LUNGS: Rales to bilateral bases HEART: Tachycardic, regular rhythm, no edema ABDOMEN: Positive bowel sounds, soft, nontender NEURO: alert & oriented x 3 with fluent speech, no focal motor/sensory deficits  LABORATORY DATA:  I have reviewed the data as listed CMP Latest Ref Rng & Units 10/04/2020 10/01/2020 09/28/2020  Glucose 70 - 99 mg/dL 193(H) 102(H) 213(H)  BUN 8 - 23 mg/dL 24(H) 23 23  Creatinine 0.44 - 1.00 mg/dL 0.76 0.65 0.84  Sodium 135 - 145 mmol/L 136 135 136  Potassium 3.5 - 5.1 mmol/L 4.7 4.4 4.2  Chloride 98 - 111 mmol/L 105 104 103  CO2 22 - 32 mmol/L _0 Calcium 8.9 - 10.3 mg/dL 9.5 9.1 9.5  Total Protein 6.5 - 8.1 g/dL 5.7(L) 5.7(L) 5.7(L)  Total Bilirubin 0.3 - 1.2 mg/dL 2.3(H) 1.7(H) 1.7(H)  Alkaline Phos 38 - 126 U/L 93 93 105  AST 15 - 41 U/L 11(L) 9(L) 7(L)  ALT 0 - 44 U/L 10 8 <6    Lab Results  Component Value Date   WBC 6.1 10/04/2020   HGB 5.1 (LL) 10/04/2020   HCT 16.1 (L) 10/04/2020   MCV 95.8 10/04/2020   PLT 19 (LL) 10/04/2020   NEUTROABS 1.5 (L) 10/04/2020    DG Chest 2 View  Result Date: 10/04/2020 CLINICAL DATA:  Shortness of breath and chest tightness EXAM: CHEST - 2 VIEW COMPARISON:  10/01/2020 FINDINGS: Cardiac shadow is enlarged  but stable. Bilateral basilar infiltrates are seen although slightly improved when compared with the prior study. No sizable effusion is seen. No acute bony abnormality is seen. IMPRESSION: Improving bibasilar infiltrates when compare with the prior study. Electronically Signed   By: Inez Catalina M.D.   On: 10/04/2020 18:03   DG Chest 2 View  Result Date: 10/01/2020 CLINICAL DATA:  Short of breath.  Cough and fever EXAM: CHEST - 2 VIEW COMPARISON:  09/01/2020 FINDINGS: Bibasilar airspace disease unchanged from the prior study. Upper lobes remain clear. No effusion. IMPRESSION: Bibasilar infiltrates unchanged from 09/01/2020. Possible atelectasis or persistent  pneumonia. No effusion. Electronically Signed   By: Franchot Gallo M.D.   On: 10/01/2020 17:37   CT Angio Chest PE W and/or Wo Contrast  Result Date: 10/05/2020 CLINICAL DATA:  PE suspected EXAM: CT ANGIOGRAPHY CHEST WITH CONTRAST TECHNIQUE: Multidetector CT imaging of the chest was performed using the standard protocol during bolus administration of intravenous contrast. Multiplanar CT image reconstructions and MIPs were obtained to evaluate the vascular anatomy. CONTRAST:  183m OMNIPAQUE IOHEXOL 350 MG/ML SOLN COMPARISON:  CT chest pulmonary angiogram, 05/02/2020, CT chest abdomen pelvis angiogram, 08/15/2019 FINDINGS: Cardiovascular: Satisfactory opacification of the pulmonary arteries to the segmental level. No evidence of pulmonary embolism. Mild cardiomegaly. Enlargement of the main pulmonary artery, measuring up to 3.6 cm in caliber. No pericardial effusion. Aortic atherosclerosis. Mediastinum/Nodes: No enlarged mediastinal, hilar, or axillary lymph nodes. Thyroid gland, trachea, and esophagus demonstrate no significant findings. Lungs/Pleura: Examination of the lung parenchyma is generally somewhat limited by breath motion artifact and expiratory technique. Within this limitation, there appears to be a combination of acute, heterogeneous airspace disease superimposed upon a background of moderate pulmonary fibrosis. Although not well characterized on this examination, predominant characteristics include peribronchovascular irregular peripheral interstitial opacity and ground-glass, some degree of bronchiectasis, and some irregular peripheral interstitial opacity and ground-glass at the bilateral lung bases. General appearance of the lungs is similar when compared to prior examinations. Trace bilateral pleural effusions. Upper Abdomen: No acute abnormality. Splenomegaly in the included upper abdomen, which is markedly increased compared to prior examination, although incompletely imaged the spleen  measures at least 16.7 cm. Musculoskeletal: No chest wall abnormality. No acute or significant osseous findings. Review of the MIP images confirms the above findings. IMPRESSION: 1. Negative examination for pulmonary embolism. 2. Examination of the lung parenchyma is generally somewhat limited by breath motion artifact and expiratory technique. Within this limitation, there appears to be a combination of acute, heterogeneous airspace disease superimposed upon a background of moderate pulmonary fibrosis. Although not well characterized on this examination, predominant characteristics include peribronchovascular irregular peripheral interstitial opacity and ground-glass, some degree of bronchiectasis, and some irregular peripheral interstitial opacity and ground-glass at the bilateral lung bases. General appearance of the lungs is similar when compared to prior examinations and suggests fibrosis in an "alternative diagnosis" pattern, leading differential consideration fibrotic NSIP. Consider ILD protocol CT to further evaluate at the resolution of acute clinical presentation. 3. Trace bilateral pleural effusions. 4. Splenomegaly in the included upper abdomen, which is markedly increased compared to prior examination, although incompletely imaged the spleen measures at least 16.7 cm. 5. Enlargement of the main pulmonary artery, as can be seen in pulmonary hypertension. 6. Cardiomegaly. Aortic Atherosclerosis (ICD10-I70.0). Electronically Signed   By: AEddie CandleM.D.   On: 10/05/2020 09:21   CARDIAC CATHETERIZATION  Result Date: 09/20/2020 1. Elevated right heart filling pressures, normal PCWP.  Evidence for RV failure. 2. Severe pulmonary  arterial hypertension. 3. Low but not markedly low CO. 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  Patient will need V/Q scan and serological workup, but will try to get her approved for Tyvaso.   ECHOCARDIOGRAM COMPLETE  Result Date: 09/14/2020    ECHOCARDIOGRAM REPORT   Patient Name:   Mitsuye Loveday  Date of Exam: 09/14/2020 Medical Rec #:  [id number]     Height:       64.0 in Accession #:    [id number]    Weight:       224.2 lb Date of Birth:  1951-01-30      BSA:          2.054 m Patient Age:    40 years      BP:           132/62 mmHg Patient Gender: F             HR:           90 bpm. Exam Location:  Outpatient Procedure: 2D Echo, Cardiac Doppler and Color Doppler Indications:    CHF  History:        Patient has prior history of Echocardiogram examinations, most                 recent 08/06/2019. CHF; Risk Factors:Hypertension, Diabetes and                 Morbid obesity. Lung disease, Covid, leukemia.  Sonographer:    Dustin Flock Referring Phys: [place]  1. Left ventricular ejection fraction, by estimation, is 65 to 70%. The left ventricle has hyperdynamic function. The left ventricle has no regional wall motion abnormalities. There is mild left ventricular hypertrophy. Left ventricular diastolic parameters are consistent with Grade II diastolic dysfunction (pseudonormalization).  2. Right ventricular systolic function is mildly reduced. The right ventricular size is normal. There is severely elevated pulmonary artery systolic pressure. The estimated right ventricular systolic pressure is 63.8 mmHg.  3. The mitral valve is normal in structure. Trivial mitral valve regurgitation. No evidence of mitral stenosis.  4. The aortic valve is tricuspid. Aortic valve regurgitation is not visualized. Mild to moderate aortic valve sclerosis/calcification is present, without any evidence of aortic stenosis (mean gradient elevated 11 mmHg but suspect due to high flow, valve  opens well).  5. The inferior vena cava is normal in size with greater than 50% respiratory variability, suggesting right atrial pressure of 3 mmHg. FINDINGS  Left Ventricle: Left ventricular ejection fraction, by estimation, is 65 to 70%. The left ventricle has hyperdynamic function. The left ventricle has no regional wall  motion abnormalities. The left ventricular internal cavity size was normal in size. There is mild left ventricular hypertrophy. Left ventricular diastolic parameters are consistent with Grade II diastolic dysfunction (pseudonormalization). Right Ventricle: The right ventricular size is normal. No increase in right ventricular wall thickness. Right ventricular systolic function is mildly reduced. There is severely elevated pulmonary artery systolic pressure. The tricuspid regurgitant velocity is 4.08 m/s, and with an assumed right atrial pressure of 3 mmHg, the estimated right ventricular systolic pressure is 93.7 mmHg. Left Atrium: Left atrial size was normal in size. Right Atrium: Right atrial size was normal in size. Pericardium: There is no evidence of pericardial effusion. Mitral Valve: The mitral valve is normal in structure. There is mild calcification of the mitral valve leaflet(s). Mild mitral annular calcification. Trivial mitral valve regurgitation. No evidence of mitral valve stenosis. Tricuspid Valve: The tricuspid valve is normal in structure.  Tricuspid valve regurgitation is mild. Aortic Valve: The aortic valve is tricuspid. Aortic valve regurgitation is not visualized. Mild to moderate aortic valve sclerosis/calcification is present, without any evidence of aortic stenosis. Aortic valve mean gradient measures 11.0 mmHg. Aortic valve peak gradient measures 20.9 mmHg. Aortic valve area, by VTI measures 2.14 cm. Pulmonic Valve: The pulmonic valve was normal in structure. Pulmonic valve regurgitation is not visualized. Aorta: The aortic root is normal in size and structure. Venous: The inferior vena cava is normal in size with greater than 50% respiratory variability, suggesting right atrial pressure of 3 mmHg. IAS/Shunts: No atrial level shunt detected by color flow Doppler.  LEFT VENTRICLE PLAX 2D LVIDd:         5.00 cm     Diastology LVIDs:         2.70 cm     LV e' medial:    5.77 cm/s LV PW:          1.20 cm     LV E/e' medial:  14.2 LV IVS:        1.20 cm     LV e' lateral:   5.87 cm/s LVOT diam:     2.30 cm     LV E/e' lateral: 14.0 LV SV:         103 LV SV Index:   50 LVOT Area:     4.15 cm  LV Volumes (MOD) LV vol d, MOD A4C: 97.8 ml LV vol s, MOD A4C: 23.3 ml LV SV MOD A4C:     97.8 ml RIGHT VENTRICLE RV Basal diam:  2.90 cm RV S prime:     8.05 cm/s TAPSE (M-mode): 2.5 cm LEFT ATRIUM             Index       RIGHT ATRIUM           Index LA diam:        3.70 cm 1.80 cm/m  RA Area:     12.80 cm LA Vol (A2C):   43.4 ml 21.13 ml/m RA Volume:   27.70 ml  13.49 ml/m LA Vol (A4C):   33.5 ml 16.31 ml/m LA Biplane Vol: 38.8 ml 18.89 ml/m  AORTIC VALVE AV Area (Vmax):    2.07 cm AV Area (Vmean):   2.07 cm AV Area (VTI):     2.14 cm AV Vmax:           228.50 cm/s AV Vmean:          153.000 cm/s AV VTI:            0.482 m AV Peak Grad:      20.9 mmHg AV Mean Grad:      11.0 mmHg LVOT Vmax:         114.00 cm/s LVOT Vmean:        76.200 cm/s LVOT VTI:          0.248 m LVOT/AV VTI ratio: 0.51  AORTA Ao Root diam: 2.60 cm MITRAL VALVE               TRICUSPID VALVE MV Area (PHT): 5.38 cm    TV Peak grad:   66.6 mmHg MV Decel Time: 141 msec    TV Vmax:        4.08 m/s MV E velocity: 82.00 cm/s  TR Peak grad:   66.6 mmHg MV A velocity: 74.40 cm/s  TR Vmax:        408.00 cm/s MV E/A ratio:  1.10                            SHUNTS                            Systemic VTI:  0.25 m                            Systemic Diam: 2.30 cm Loralie Champagne MD Electronically signed by Loralie Champagne MD Signature Date/Time: 09/14/2020/11:21:23 AM    Final      Peripheral blood smear: Platelets appear markedly decreased in number, scattered large platelet forms, marked variability in red cell size, ovalocytes, few nucleated red blood cells, the majority of WBCs are neutrophils and lymphocytes, acanthocytes noted, slight increase in monocytes.  ASSESSMENT AND PLAN: 1.  CMML 2.  Anemia 3.  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  SIRS 5.  Chronic respiratory failure with hypoxia and interstitial lung disease,?  Healthcare associated pneumonia 6.  Coagulopathy 7.  Diastolic CHF 8.  Hypertension 9.  Diabetes mellitus 10.  Anxiety 11.  Hyperbilirubinemia 12.  Splenomegaly secondary to #1  Ms. Hopkinson is currently admitted secondary to symptomatic anemia.  Her hemoglobin on admission was 5.1.  She was also found to have significant thrombocytopenia at 19,000.  She has not actively bleeding.  Her anemia and thrombocytopenia are likely due to underlying MDS and recent Vidaza.  She is also noted to have worsening of her splenomegaly.  Her LDH is elevated, but improved compared to last check a month ago.  Reticulocytes are not elevated.  We will plan to review her peripheral blood smear later today.  Recommend PRBC transfusion to keep hemoglobin above 8.  No need for platelet transfusion unless active bleeding.  Will discuss need for ongoing Nplate administration given lack of response.  She will be due for her next dose today.  Agree with antibiotics for empiric treatment of possible pneumonia.  Recommendations: 1.  Monitor CBC daily.  Transfuse PRBCs for symptomatic anemia.  No need for platelet transfusion unless active bleeding. 2.  Administer Nplate 5 mcg/kg (outpatient dosing) today.  Plan to continue this weekly. 3.  Vitamin K 10 mg p.o. daily x3 days. 4.  Check vitamin B12 level and folate RBC with next lab draw. 5.  Recommend PT consultation. 6.  Outpatient follow-up at the Cancer center as scheduled   LOS: 0 days   Mikey Bussing, DNP, AGPCNP-BC, AOCNP 10/05/20 Ms. Louk was interviewed and examined.  I reviewed the peripheral blood smear.  She is followed by Dr. Irene Limbo with CMML and is now at day 15 following cycle two 5-azacytidine.  She has a complex medical history and presents with symptomatic anemia.  She has severe anemia and thrombocytopenia.  The hematologic findings are likely related to  both CMML and 5-azacytidine.  Dr. Irene Limbo feels the thrombocytopenia is also in part related to ITP as the thrombocytopenia has responded to prednisone in the past.  I agree with Red cell transfusion support.  There is no indication for a platelet transfusion at present.  We will continue the weekly Nplate.  I was present for greater than 50% of today's visit.  I performed medical decision making.

## 2020-10-05 NOTE — ED Provider Notes (Signed)
[facility] EMERGENCY DEPARTMENT Provider Note   CSN: [id number] Arrival date & time: 10/04/20  1352     History Chief Complaint  Patient presents with   Shortness of Breath   Weakness    Kathy Irwin is a 70 y.o. 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  The patient reports constant, worsening shortness of breath over the last week.  She has been having bilateral chest pain, characterized as tightness that has been constant for the last 2 to 3 days.  No known aggravating or factors regarding chest pain.  She does report that she has been very lightheaded and dizzy when she attempts to female and is here now at her baseline, she can ambulate to the restroom independently across the room.  States she is feeling very weak and fatigued.  Now, she stands for longer than 2 minutes. Can minimally ambulate due to feeling very lightheaded and short of breath.  She also notes that her heart has been racing for the last few days.  She feels that her legs are more swollen and her right leg has been more painful over the last few days.  She is unsure if this was related to a fall that she had several months ago where she sustained a hip fracture.  No history of DVT or PE.  She is not on anticoagulation.  She reports that she has not had to increase her home oxygen.   She denies fever, chills, abdominal distention, nausea, cough, numbness, visual changes, syncope, melena, hematochezia, abdominal pain, rash.   Patient previously requiring blood transfusions over the last few weeks.   The history is provided by medical records and the patient. No language interpreter was used.      Past Medical History:  Diagnosis Date   Acute respiratory  failure with hypoxia ([place]) 12/2018   Anemia, unspecified    CHF (congestive heart failure) (HCC)    CML (chronic myelocytic leukemia) ([place])    Diabetes mellitus without complication ([place])    Esophageal reflux    Hyperlipemia    Hypersensitivity pneumonitis ([place]) 12/19/2017   Hypertension    ILD (interstitial lung disease) ([place]) 12/24/2018    Patient Active Problem List   Diagnosis Date Noted   Weakness 10/05/2020   Symptomatic anemia    Counseling regarding advance care planning and goals of care    Pyogenic arthritis of right hip ([place])    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[place])    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) 07/06/2020   Hyperbilirubinemia 07/05/2020   Idiopathic thrombocytopenia ([place]) 07/05/2020   Pre-op testing 07/02/2020   Postoperative wound infection of right hip 06/29/2020   Closed right hip fracture ([place]) 04/29/2020   Closed intertrochanteric fracture of hip, right, initial encounter ([place]) 04/29/2020   Fall 04/29/2020   Transient hypotension 04/29/2020   Cellulitis of right lower leg 11/09/2019   Chronic diastolic CHF (congestive heart failure) ([place]) 08/02/2019   Atherosclerosis of aorta ([place]) 01/23/2019   Obesity, Class III, BMI 40-49.9 (morbid obesity) ([place]) 12/24/2018   Hypersensitivity pneumonitis ([place]) 12/19/2017   Thrombocytopenia ([place]) 12/19/2017   Insulin-requiring or dependent type II diabetes mellitus ([place]) 12/17/2017   HTN (hypertension) 12/17/2017   Essential hypertension 11/05/2017   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[place]) 11/05/2017    Past Surgical History:  Procedure Laterality Date   ABDOMINAL HYSTERECTOMY     INTRAMEDULLARY (IM) NAIL INTERTROCHANTERIC Right 04/30/2020   Procedure: INTRAMEDULLARY (IM) NAIL INTERTROCHANTRIC;  Surgeon: Shona Needles, MD;  Location: [facility];  Service: Orthopedics;  Laterality: Right;   RIGHT HEART CATH N/A 09/17/2020   Procedure: RIGHT HEART CATH;  Surgeon: Larey Dresser, MD;  Location: [facility];  Service: Cardiovascular;  Laterality: N/A;   VIDEO BRONCHOSCOPY Bilateral 04/02/2019   Procedure: VIDEO BRONCHOSCOPY WITH FLUORO;  Surgeon: Marshell Garfinkel, MD;  Location: [facility];  Service: Cardiopulmonary;  Laterality: Bilateral;     OB History   No obstetric history on file.     Family History  Problem Relation Age of [facility]   Diabetes Other    Hypertension Other     Social History   Tobacco Use   Smoking status: Never   Smokeless tobacco: Never  Vaping Use   Vaping Use: Never used  Substance Use Topics   Alcohol use: Never   Drug use: Never    Home Medications Prior to Admission medications   Medication Sig Start Date End Date Taking? Authorizing Provider  ACCU-CHEK GUIDE test strip TEST UP TO 4 TIMES A DAY 08/24/20   Kerin Perna, NP  Accu-Chek Softclix Lancets lancets TEST UP TO 4 TIMES A DAY 08/24/20   Kerin Perna, NP  acetaminophen (TYLENOL) 500 MG tablet Take 1,000 mg by mouth every 6 (six) hours as needed for moderate pain.    [provider]  albuterol (VENTOLIN HFA) 108 (90 Base) MCG/ACT inhaler INHALE 1-2 PUFFS BY MOUTH EVERY 6 HOURS AS NEEDED FOR WHEEZE OR SHORTNESS OF BREATH Patient taking differently: Inhale 1-2 puffs into the lungs every 6 (six) hours as needed for wheezing or shortness of breath. 05/09/20   Kerin Perna, NP  BD PEN NEEDLE NANO 2ND GEN 32G X 4 MM MISC USE WITH LANTUS' 07/04/20   [provider]  blood glucose meter kit and supplies KIT Dispense based on patient and insurance preference. Use up to four times daily as directed. (FOR ICD-9 250.00, 250.01). For QAC - HS accuchecks. 11/22/18   Thurnell Lose, MD  [employer] TUSSIN DM 200-20 MG/10ML liquid Take 5 mLs by mouth every 4 (four) hours as needed for cough. 07/22/20   [provider]  D-5000 125 MCG (5000 UT) TABS Take 5,000 Units by mouth daily. 07/04/20   [provider]  fluticasone (FLONASE) 50 MCG/ACT nasal spray PLACE 2 SPRAYS  INTO BOTH NOSTRILS DAILY AS NEEDED FOR ALLERGIES OR RHINITIS. 07/04/20   Kerin Perna, NP  furosemide (LASIX) 20 MG tablet Take 2 tablets (40 mg total) by mouth daily. Patient taking differently: Take 20-40 mg by mouth See admin instructions. Take 40 mg by mouth in the morning and 20 mg at night 08/24/20   Larey Dresser, MD  HYDROcodone-acetaminophen [facility]) 10-325 MG tablet Take 1 tablet by mouth every 6 (six) hours as needed. 09/21/20   Orson Slick, MD  linaclotide [facility]) 145 MCG CAPS capsule Take 1 capsule (145 mcg total) by mouth daily before breakfast. 08/31/20   Kerin Perna, NP  metFORMIN (GLUCOPHAGE) 1000 MG tablet Take 1,000 mg by mouth in the morning and at bedtime. 08/13/20   [provider]  metoprolol succinate (TOPROL-XL) 25 MG 24 hr tablet Take 1 tablet (25 mg total) by mouth daily. 08/31/20   Kerin Perna, NP  Multiple Vitamin (MULTIVITAMIN WITH MINERALS) TABS tablet Take  1 tablet by mouth daily. 12/02/19   Pokhrel, Corrie Mckusick, MD  olopatadine (PATANOL) 0.1 % ophthalmic solution Place 1 drop into both eyes 2 (two) times daily. 07/24/19   Wieters, Hallie C, PA-C  ondansetron (ZOFRAN) 8 MG tablet Take 1 tablet (8 mg total) by mouth every 8 (eight) hours as needed for nausea. 09/15/20   Brunetta Genera, MD  OXYGEN Inhale 3 L into the lungs continuous.    [provider]  pregabalin (LYRICA) 100 MG capsule Take 100 mg by mouth at bedtime.    [provider]  senna-docusate (SENOKOT-S) 8.6-50 MG tablet Take 1 tablet by mouth 2 (two) times daily. 07/21/20 10/19/20  Terrilee Croak, MD  sertraline (ZOLOFT) 100 MG tablet TAKE 1 TABLET BY MOUTH AT BEDTIME Patient taking differently: Take 100 mg by mouth at bedtime. 08/31/20   Kerin Perna, NP  simethicone (MYLICON) 80 MG chewable tablet Chew 1 tablet (80 mg total) by mouth 4 (four) times daily as needed for flatulence. 12/01/19   Pokhrel, Corrie Mckusick, MD  spironolactone (ALDACTONE) 25 MG tablet Take  0.5 tablets (12.5 mg total) by mouth daily. 08/24/20   Larey Dresser, MD  triamcinolone cream (KENALOG) 0.1 % Apply 1 application topically every 6 (six) hours as needed (raw skin). 07/04/20   [provider]    Allergies    Other, Ativan [lorazepam], Iodine, Iodine i 131 tositumomab, Merbromin, and Tape  Review of Systems   Review of Systems  Constitutional:  Negative for activity change, chills, diaphoresis and fever.  HENT:  Negative for congestion and sore throat.   Eyes:  Negative for visual disturbance.  Respiratory:  Positive for chest tightness and shortness of breath.   Cardiovascular:  Positive for chest pain and leg swelling.  Gastrointestinal:  Negative for abdominal pain, diarrhea, nausea and vomiting.  Genitourinary:  Negative for dysuria and frequency.  Musculoskeletal:  Negative for arthralgias, back pain, myalgias and neck stiffness.  Skin:  Negative for rash and wound.  Allergic/Immunologic: Negative for immunocompromised state.  Neurological:  Positive for weakness and light-headedness. Negative for dizziness, syncope, numbness and headaches.  Psychiatric/Behavioral:  Negative for confusion.    Physical Exam Updated Vital Signs BP (!) 104/50   Pulse (!) 112   Temp 98.5 F (36.9 C) (Oral)   Resp (!) 35   Ht 5' 4.5" (1.638 m)   Wt 101.6 kg   SpO2 99%   BMI 37.86 kg/m   Physical Exam Vitals and nursing note reviewed.  Constitutional:      General: She is not in acute distress.    Appearance: She is not ill-appearing, toxic-appearing or diaphoretic.     Comments: [place] in place  HENT:     Head: Normocephalic.     Mouth/Throat:     Mouth: Mucous membranes are moist.     Pharynx: No oropharyngeal exudate or posterior oropharyngeal erythema.  Eyes:     General: No scleral icterus.    Conjunctiva/sclera: Conjunctivae normal.  Cardiovascular:     Rate and Rhythm: Regular rhythm. Tachycardia present.     Heart sounds: No murmur heard.   No friction  rub. No gallop.  Pulmonary:     Effort: Pulmonary effort is normal. No respiratory distress.     Breath sounds: No stridor. No wheezing, rhonchi or rales.     Comments: Lungs are clear to auscultation bilaterally Chest:     Chest wall: No tenderness.  Abdominal:     General: There is no distension.  Palpations: Abdomen is soft. There is no mass.     Tenderness: There is no abdominal tenderness. There is no right CVA tenderness, left CVA tenderness, guarding or rebound.     Hernia: No hernia is present.  Musculoskeletal:        General: No tenderness.     Cervical back: Neck supple.     Right lower leg: Edema present.     Left lower leg: Edema present.  Skin:    General: Skin is warm.     Findings: No rash.  Neurological:     General: No focal deficit present.     Mental Status: She is alert.  Psychiatric:        Behavior: Behavior normal.    ED Results / Procedures / Treatments   Labs (all labs ordered are listed, but only abnormal results are displayed) Labs Reviewed  CBC WITH DIFFERENTIAL/PLATELET - Abnormal; Notable for the following components:      Result Value   RBC 1.68 (*)    Hemoglobin 5.1 (*)    HCT 16.1 (*)    RDW 17.5 (*)    Platelets 19 (*)    nRBC 7.4 (*)    Neutro Abs 1.5 (*)    Monocytes Absolute 1.7 (*)    Abs Immature Granulocytes 0.24 (*)    All other components within normal limits  COMPREHENSIVE METABOLIC PANEL - Abnormal; Notable for the following components:   Glucose, Bld 193 (*)    BUN 24 (*)    Total Protein 5.7 (*)    Albumin 2.8 (*)    AST 11 (*)    Total Bilirubin 2.3 (*)    All other components within normal limits  BRAIN NATRIURETIC PEPTIDE - Abnormal; Notable for the following components:   B Natriuretic Peptide 500.3 (*)    All other components within normal limits  PROTIME-INR - Abnormal; Notable for the following components:   Prothrombin Time 21.8 (*)    INR 1.9 (*)    All other components within normal limits  APTT -  Abnormal; Notable for the following components:   aPTT 44 (*)    All other components within normal limits  SARS CORONAVIRUS 2 (TAT 6-24 HRS)  LACTATE DEHYDROGENASE  SAVE SMEAR (SSMR)  RETICULOCYTES  TYPE AND SCREEN  PREPARE RBC (CROSSMATCH)  TROPONIN I (HIGH SENSITIVITY)  TROPONIN I (HIGH SENSITIVITY)    EKG None  Radiology DG Chest 2 View  Result Date: 10/04/2020 CLINICAL DATA:  Shortness of breath and chest tightness EXAM: CHEST - 2 VIEW COMPARISON:  10/01/2020 FINDINGS: Cardiac shadow is enlarged but stable. Bilateral basilar infiltrates are seen although slightly improved when compared with the prior study. No sizable effusion is seen. No acute bony abnormality is seen. IMPRESSION: Improving bibasilar infiltrates when compare with the prior study. Electronically Signed   By: Inez Catalina M.D.   On: 10/04/2020 18:03    Procedures .Critical Care  Date/Time: 10/05/2020 7:36 AM Performed by: Joanne Gavel, PA-C Authorized by: Joanne Gavel, PA-C   Critical care provider statement:    Critical care time (minutes):  40   Critical care time was exclusive of:  Separately billable procedures and treating other patients and teaching time   Critical care was necessary to treat or prevent imminent or life-threatening deterioration of the following conditions:  Circulatory failure   Critical care was time spent personally by me on the following activities:  Ordering and performing treatments and interventions, ordering and review of laboratory studies,  ordering and review of radiographic studies, pulse oximetry, re-evaluation of patient's condition, review of old charts, evaluation of patient's response to treatment, examination of patient, obtaining history from patient or surrogate and development of treatment plan with patient or surrogate   I assumed direction of critical care for this patient from another provider in my specialty: no     Care discussed with: admitting provider      Medications Ordered in ED Medications  0.9 %  sodium chloride infusion (Manually program via Guardrails IV Fluids) (0 mLs Intravenous Hold 10/05/20 0330)  sodium chloride flush (NS) 0.9 % injection 3 mL (has no administration in time range)  acetaminophen (TYLENOL) tablet 650 mg (has no administration in time range)    Or  acetaminophen (TYLENOL) suppository 650 mg (has no administration in time range)  ondansetron (ZOFRAN) tablet 4 mg (has no administration in time range)    Or  ondansetron (ZOFRAN) injection 4 mg (has no administration in time range)  albuterol (PROVENTIL) (2.5 MG/3ML) 0.083% nebulizer solution 2.5 mg (has no administration in time range)  methocarbamol (ROBAXIN) tablet 500 mg (500 mg Oral Given 10/05/20 0519)  ondansetron (ZOFRAN) injection 4 mg (4 mg Intravenous Given 10/05/20 0511)  diphenhydrAMINE (BENADRYL) injection 25 mg (25 mg Intravenous Given 10/05/20 3559)    ED Course  I have reviewed the triage vital signs and the nursing notes.  Pertinent labs & imaging results that were available during my care of the patient were reviewed by me and considered in my medical decision making (see chart for details).    MDM Rules/Calculators/A&P                          70 year old female with a history of chronic myelmonoocytic leukemia, chronic back respiratory failure on 3L home O2, DM Type II, chronic diastolic CHF, hypersensitivity pneumonitis, ILD, HTN and symptomatic anemia who presents to the emergency department with shortness of breath.  Tachycardic.  Tachypneic.  Normotensive and afebrile.  No hypoxia.  She is on her home 3 L of O2.  The patient was seen and independently evaluated by Dr. Roxanne Mins, attending physician.  Labs and imaging of been reviewed and independently interpreted by me.  Hemoglobin is 5.1/hematocrit 16.1, down from 10.4/33.1 on 6/17.  She denies any symptoms of active bleeding at this time.  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  Will transfuse 2 L of  packed RBCs in the ED.  However, shortness of breath could be multifactorial.    She does have thrombocytopenia, but slightly improved from previous.  BNP is elevated to 500.  She does have edema of the bilateral lower extremities.  Chest x-ray with improving bibasilar infiltrates.  Although mild volume overload could be contributing to the patient's symptoms, I am less suspicious for decompensated CHF.  However, since the patient is tachycardic and has a history of CMML, PE also needs to be considered as dyspnea could be multifactorial, especially since she is having worsening pain in her right leg and has been more sedentary over the last few weeks.  PE study is pending.   Doubt GI bleed.  Doubt ACS as troponin as not elevated.  Doubt tension pneumothorax, bacterial pneumonia.   Consult to the hospitalist team and Dr. Nevada Crane will accept the patient for admission. The patient appears reasonably stabilized for admission considering the current resources, flow, and capabilities available in the ED at this time, and I doubt any other [facility] requiring further screening and/or treatment  in the ED prior to admission.  Final Clinical Impression(s) / ED Diagnoses Final diagnoses:  None    Rx / DC Orders ED Discharge Orders     None        Joanne Gavel, PA-C 59/45/85 [id number]    Delora Fuel, MD 44/62/86 [id number]

## 2020-10-05 NOTE — H&P (Addendum)
History and Physical    Kathy Irwin JEH:[id number] DOB: May 29, 1950 DOA: 10/04/2020  Referring MD/NP/PA: Irene Pap, DO PCP: Kathy Perna, NP  Consultants: Kathy Majestic, MD-cardiology Kathy Hamming, MD-orthopedics Kathy Limbo, MD-oncology Kathy Garfinkel, MD pulmonology Patient coming from: Home via EMS  Chief Complaint: Shortness of breath  I have personally briefly reviewed patient's old medical records in [place]   HPI: Kathy Irwin is a 70 y.o. 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  She last had received infusion with romiplostim on 6/14.  On 6/17 patient presented to the emergency department with complaints of increasing shortness of breath.  She reported that whenever she is standing up she is getting dizzy, breaking out in a sweat, and passing out.  Labs revealed WBC 2.3, hemoglobin 10.4, and platelets 11.  Chest x-ray which showed basilar infiltrates that were unchanged from 5/18.  He ultimately was deemed stable enough for discharge.  After getting home patient reports symptoms persisted to the point in which she was unable to stand up at all on her own.  Noted associated symptoms of muscle cramps, chest tightness, anxiety, nausea, and vomiting only able to keep [employer] and saltine crackers down.  Denies having any blood in stool, cough, dysuria, or diarrhea.   ED Course: Upon admission into the emergency department patient was seen to be afebrile, pulse 82-116, respirations 16-37, blood pressures 104/58 -147/56, and O2 saturation maintained on room air.  Patient was placed on 3 L of oxygen for comfort.  Labs significant for hemoglobin 5.1, platelets 19, BUN 24, creatinine 0.76, glucose 193, albumin 2.8, BNP 500.3, troponin 5, INR 1.9, and APTT 44.  Chest x-ray noted improving bibasilar infiltrates compared to x-ray from 6/17.   Patient had been given Benadryl, Robaxin, and Zofran.  Patient has been ordered to be transfused 2 units of packed red blood cells.  CT angiogram chest was ordered with contrast.   Review of Systems  Constitutional:  Positive for chills (Chronic) and malaise/fatigue. Negative for fever.  HENT:  Negative for congestion and nosebleeds.   Eyes:  Negative for photophobia and pain.  Respiratory:  Positive for shortness of breath.   Cardiovascular:  Positive for chest pain. Negative for leg swelling.  Gastrointestinal:  Positive for nausea and vomiting. Negative for blood in stool and diarrhea.  Genitourinary:  Negative for frequency.  Musculoskeletal:  Positive for myalgias. Negative for falls.  Neurological:  Positive for dizziness and weakness.  Psychiatric/Behavioral:  The patient is nervous/anxious.    Past Medical History:  Diagnosis Date   Acute respiratory failure with hypoxia ([place]) 12/2018   Anemia, unspecified    CHF (congestive heart failure) (HCC)    CML (chronic myelocytic leukemia) (HCC)    Diabetes mellitus without complication (HCC)    Esophageal reflux    Hyperlipemia    Hypersensitivity pneumonitis ([place]) 12/19/2017   Hypertension    ILD (interstitial lung disease) ([place]) 12/24/2018    Past Surgical History:  Procedure Laterality Date   ABDOMINAL HYSTERECTOMY     INTRAMEDULLARY (IM) NAIL INTERTROCHANTERIC Right 04/30/2020   Procedure: INTRAMEDULLARY (IM) NAIL INTERTROCHANTRIC;  Surgeon: Shona Needles, MD;  Location: [facility];  Service: Orthopedics;  Laterality: Right;   RIGHT HEART CATH N/A 09/17/2020   Procedure: RIGHT HEART CATH;  Surgeon: Larey Dresser, MD;  Location: [facility];  Service: Cardiovascular;  Laterality:  N/A;   VIDEO BRONCHOSCOPY Bilateral 04/02/2019   Procedure: VIDEO BRONCHOSCOPY WITH FLUORO;  Surgeon: Kathy Garfinkel, MD;  Location: [facility];  Service: Cardiopulmonary;  Laterality: Bilateral;     reports that she has never smoked. She has  never used smokeless tobacco. She reports that she does not drink alcohol and does not use drugs.  Allergies  Allergen Reactions   Other Shortness Of Breath and Other (See Comments)    Newspaper ink =  new chest pain, also   Ativan [Lorazepam] Other (See Comments)    Severe confusion and agitation.    Iodine Other (See Comments)    Unknown reaction   Iodine I 131 Tositumomab    Merbromin Other (See Comments)    Unknown reaction - Mercurochrome- "Was a long time ago"    Tape Rash    Family History  Problem Relation Age of Onset   Diabetes Other    Hypertension Other     Prior to Admission medications   Medication Sig Start Date End Date Taking? Authorizing Provider  ACCU-CHEK GUIDE test strip TEST UP TO 4 TIMES A DAY 08/24/20   Kathy Perna, NP  Accu-Chek Softclix Lancets lancets TEST UP TO 4 TIMES A DAY 08/24/20   Kathy Perna, NP  acetaminophen (TYLENOL) 500 MG tablet Take 1,000 mg by mouth every 6 (six) hours as needed for moderate pain.    [provider]  albuterol (VENTOLIN HFA) 108 (90 Base) MCG/ACT inhaler INHALE 1-2 PUFFS BY MOUTH EVERY 6 HOURS AS NEEDED FOR WHEEZE OR SHORTNESS OF BREATH Patient taking differently: Inhale 1-2 puffs into the lungs every 6 (six) hours as needed for wheezing or shortness of breath. 05/09/20   Kathy Perna, NP  BD PEN NEEDLE NANO 2ND GEN 32G X 4 MM MISC USE WITH LANTUS' 07/04/20   [provider]  blood glucose meter kit and supplies KIT Dispense based on patient and insurance preference. Use up to four times daily as directed. (FOR ICD-9 250.00, 250.01). For QAC - HS accuchecks. 11/22/18   Thurnell Lose, MD  [facility] TUSSIN DM 200-20 MG/10ML liquid Take 5 mLs by mouth every 4 (four) hours as needed for cough. 07/22/20   [provider]  D-5000 125 MCG (5000 UT) TABS Take 5,000 Units by mouth daily. 07/04/20   [provider]  fluticasone (FLONASE) 50 MCG/ACT nasal spray PLACE 2 SPRAYS INTO BOTH  NOSTRILS DAILY AS NEEDED FOR ALLERGIES OR RHINITIS. 07/04/20   Kathy Perna, NP  furosemide (LASIX) 20 MG tablet Take 2 tablets (40 mg total) by mouth daily. Patient taking differently: Take 20-40 mg by mouth See admin instructions. Take 40 mg by mouth in the morning and 20 mg at night 08/24/20   Larey Dresser, MD  HYDROcodone-acetaminophen [facility]) 10-325 MG tablet Take 1 tablet by mouth every 6 (six) hours as needed. 09/21/20   Orson Slick, MD  linaclotide [facility]) 145 MCG CAPS capsule Take 1 capsule (145 mcg total) by mouth daily before breakfast. 08/31/20   Kathy Perna, NP  metFORMIN (GLUCOPHAGE) 1000 MG tablet Take 1,000 mg by mouth in the morning and at bedtime. 08/13/20   [provider]  metoprolol succinate (TOPROL-XL) 25 MG 24 hr tablet Take 1 tablet (25 mg total) by mouth daily. 08/31/20   Kathy Perna, NP  Multiple Vitamin (MULTIVITAMIN WITH MINERALS) TABS tablet Take 1 tablet by mouth daily. 12/02/19   Pokhrel, Corrie Mckusick, MD  olopatadine (PATANOL) 0.1 % ophthalmic  solution Place 1 drop into both eyes 2 (two) times daily. 07/24/19   Wieters, Hallie C, PA-C  ondansetron (ZOFRAN) 8 MG tablet Take 1 tablet (8 mg total) by mouth every 8 (eight) hours as needed for nausea. 09/15/20   Brunetta Genera, MD  OXYGEN Inhale 3 L into the lungs continuous.    [provider]  pregabalin (LYRICA) 100 MG capsule Take 100 mg by mouth at bedtime.    [provider]  senna-docusate (SENOKOT-S) 8.6-50 MG tablet Take 1 tablet by mouth 2 (two) times daily. 07/21/20 10/19/20  Terrilee Croak, MD  sertraline (ZOLOFT) 100 MG tablet TAKE 1 TABLET BY MOUTH AT BEDTIME Patient taking differently: Take 100 mg by mouth at bedtime. 08/31/20   Kathy Perna, NP  simethicone (MYLICON) 80 MG chewable tablet Chew 1 tablet (80 mg total) by mouth 4 (four) times daily as needed for flatulence. 12/01/19   Pokhrel, Corrie Mckusick, MD  spironolactone (ALDACTONE) 25 MG tablet Take 0.5 tablets  (12.5 mg total) by mouth daily. 08/24/20   Larey Dresser, MD  triamcinolone cream (KENALOG) 0.1 % Apply 1 application topically every 6 (six) hours as needed (raw skin). 07/04/20   [provider]    Physical Exam:  Constitutional: Elderly obese female who appears to be in some distress Vitals:   10/05/20 0530 10/05/20 0545 10/05/20 0600 10/05/20 0615  BP: (!) 120/58 (!) 116/105 (!) 106/58 (!) 104/50  Pulse: (!) 108 (!) 110  (!) 112  Resp: (!) 25 (!) 23 (!) 37 (!) 35  Temp: 98.5 F (36.9 C)     TempSrc: Oral     SpO2: 100%   99%  Weight:      Height:       Eyes: PERRL, lids and conjunctivae normal ENMT: Mucous membranes are moist. Posterior pharynx clear of any exudate or lesions.  Neck: normal, supple, no masses, no thyromegaly Respiratory: Tachypneic with intermittent crackles, but no significant wheezes or rhonchi appreciated.  Currently on 3 L nasal cannula oxygen with O2 saturations maintained Cardiovascular: Tachycardic, no murmurs / rubs / gallops. No extremity edema. 2+ pedal pulses. No carotid bruits.  Abdomen: no tenderness, no masses palpated.  Musculoskeletal: no clubbing / cyanosis. No joint deformity upper and lower extremities. Good ROM, no contractures. Normal muscle tone.  Skin: no rashes, lesions, ulcers. No induration Neurologic: CN 2-12 grossly intact.  Able to move all extremity Psychiatric: Normal judgment and insight. Alert and oriented x 3.  Anxious mood.     Labs on Admission: I have personally reviewed following labs and imaging studies  CBC: Recent Labs  Lab 09/28/20 1326 10/01/20 2100 10/04/20 1713  WBC 5.0 2.3* 6.1  NEUTROABS 1.0*  --  1.5*  HGB 6.0* 10.4* 5.1*  HCT 18.8* 33.1* 16.1*  MCV 93.5 95.4 95.8  PLT 13* 11* 19*   Basic Metabolic Panel: Recent Labs  Lab 09/28/20 1326 10/01/20 2100 10/04/20 1713  NA 136 135 136  K 4.2 4.4 4.7  CL 103 104 105  CO2 _0 GLUCOSE 213* 102* 193*  BUN 23 23 24*  CREATININE 0.84  0.65 0.76  CALCIUM 9.5 9.1 9.5   GFR: Estimated Creatinine Clearance: 76.6 mL/min (by C-G formula based on SCr of 0.76 mg/dL). Liver Function Tests: Recent Labs  Lab 09/28/20 1326 10/01/20 2100 10/04/20 1713  AST 7* 9* 11*  ALT <_1 ALKPHOS 105 93 93  BILITOT 1.7* 1.7* 2.3*  PROT 5.7* 5.7* 5.7*  ALBUMIN  2.6* 2.8* 2.8*   No results for input(s): LIPASE, AMYLASE in the last 168 hours. No results for input(s): AMMONIA in the last 168 hours. Coagulation Profile: Recent Labs  Lab 10/05/20 0334  INR 1.9*   Cardiac Enzymes: No results for input(s): CKTOTAL, CKMB, CKMBINDEX, TROPONINI in the last 168 hours. BNP (last 3 results) No results for input(s): PROBNP in the last 8760 hours. HbA1C: No results for input(s): HGBA1C in the last 72 hours. CBG: No results for input(s): GLUCAP in the last 168 hours. Lipid Profile: No results for input(s): CHOL, HDL, LDLCALC, TRIG, CHOLHDL, LDLDIRECT in the last 72 hours. Thyroid Function Tests: No results for input(s): TSH, T4TOTAL, FREET4, T3FREE, THYROIDAB in the last 72 hours. Anemia Panel: No results for input(s): VITAMINB12, FOLATE, FERRITIN, TIBC, IRON, RETICCTPCT in the last 72 hours. Urine analysis:    Component Value Date/Time   COLORURINE YELLOW 05/04/2020 0050   APPEARANCEUR CLEAR 05/04/2020 0050   LABSPEC 1.014 05/04/2020 0050   PHURINE 7.0 05/04/2020 0050   GLUCOSEU NEGATIVE 05/04/2020 0050   HGBUR NEGATIVE 05/04/2020 0050   BILIRUBINUR NEGATIVE 05/04/2020 0050   KETONESUR NEGATIVE 05/04/2020 0050   PROTEINUR NEGATIVE 05/04/2020 0050   UROBILINOGEN 1.0 08/14/2019 1824   NITRITE NEGATIVE 05/04/2020 0050   LEUKOCYTESUR NEGATIVE 05/04/2020 0050   Sepsis Labs: Recent Results (from the past 240 hour(s))  Culture, blood (Routine X 2) w Reflex to ID Panel     Status: None (Preliminary result)   Collection Time: 10/01/20  7:34 PM   Specimen: BLOOD  Result Value Ref Range Status   Specimen Description   Final    BLOOD  BLOOD RIGHT ARM Performed at [facility], [place] [address] [place], [place] [address]    Special Requests   Final    BOTTLES DRAWN AEROBIC AND ANAEROBIC Blood Culture adequate volume Performed at [facility] [address], [place], [place] [address]    Culture   Final    NO GROWTH 2 DAYS Performed at [facility] [address], [place], [place] [address]    Report Status PENDING  Incomplete  Culture, blood (Routine X 2) w Reflex to ID Panel     Status: None (Preliminary result)   Collection Time: 10/01/20  9:00 PM   Specimen: BLOOD  Result Value Ref Range Status   Specimen Description   Final    BLOOD BLOOD RIGHT HAND Performed at [facility] [address], [place], [place] [address]    Special Requests   Final    BOTTLES DRAWN AEROBIC AND ANAEROBIC Blood Culture adequate volume Performed at [facility] [address], [place], [place] [address]    Culture   Final    NO GROWTH 2 DAYS Performed at [employer] [facility] [address] [place], [place] [address]    Report Status PENDING  Incomplete     Radiological Exams on Admission: DG Chest 2 View  Result Date: 10/04/2020 CLINICAL DATA:  Shortness of breath and chest tightness EXAM: CHEST - 2 VIEW COMPARISON:  10/01/2020 FINDINGS: Cardiac shadow is enlarged but stable. Bilateral basilar infiltrates are seen although slightly improved when compared with the prior study. No sizable effusion is seen. No acute bony abnormality is seen. IMPRESSION: Improving bibasilar infiltrates when compare with the prior study. Electronically Signed   By: Inez Catalina M.D.   On: 10/04/2020 18:03    EKG: Independently reviewed.  Sinus tachycardia 101 bpm  Assessment/Plan Symptomatic  anemia CMML: Acute.  Patient presents with complaints of shortness of breath and was found to have a hemoglobin of 5.1 g/dL down from 10.4 g/dL on 6/17.She has known CML for  which she is followed by Dr. [name] of oncology in the outpatient setting and had last received an infusion of romiplostim 6/14.  Patient does not report any known signs of bleeding. -Admit to medical telemetry bed -Continue with transfusion of 2 units of packed red blood cells -Check stool guaiac -Check LDH and peripheral smear -Recheck H&H posttransfusion -Oncology consulted,  will follow-up for any further recommendations  SIRS: Patient was noted to be tachycardic and tachypneic on admission.  WBC was within normal limits.  Chest x-ray had noted improving bibasilar opacities.  She had been hospitalized for postoperative wound infection of the right hip in March.   -Add on lactic acid -Check blood cultures -Check urinalysis  Chronic respiratory failure with hypoxia interstitial lung disease possible healthcare associated pneumonia: On admission patient appears to be maintained on home 2-3L of nasal cannula oxygen.  No significant wheezing or rhonchi appreciated on physical exam.  CTA of the chest did not note any signs of a PE, but did note interstitial lung disease with possible superimposed acute opacities on the chest x-ray previously noted -Continuous pulse oximetry with nasal cannula oxygen maintain O2 saturations greater than 92% -Check procalcitonin -Albuterol breathing treatments as needed -Empiric antibiotics of Rocephin and azithromycin -Continue outpatient follow-up with pulmonology  Coagulopathy: Acute.  On admission INR was 1.9 and APTT 44. -Continue to monitor  Chronic ITP: Platelet count 19 on admission which appears around patient's baseline. -Continue to monitor  Splenomegaly: Acute on chronic.  As noted on CT of the chest which reveals acute increase in size up to 16.7 cm.  Thought related with patient's CML. -Continue to treat underlying conditions  Diastolic congestive heart failure: Chronic.  Patient was noted only to have trace lower extremity edema on physical  exam and does not appear fluid overloaded.  BNP was elevated at 500.3.  Last echocardiogram revealed EF of 65-70% with grade 2 diastolic dysfunction. -Strict intake and output -Daily weight -Continue home furosemide 40 mg p.o. daily -Continue to monitor and  Nausea and vomiting -Antiemetics as needed  Essential hypertension: Blood pressures on admission were 104/52-147/56 home blood pressure medications include metoprolol succinate 25 mg daily and furosemide 40 mg daily. -Continue home regimen as tolerated  Diabetes mellitus type 2, controlled: On admission glucose mildly elevated up to 193.  Last hemoglobin A1c was noted to be 6 on 08/09/2020.  Home medication regimen includes metformin. -Hypoglycemic protocol -Hold metformin -CBGs before every meal with sensitive SSI  Anxiety: Patient appears to have been taking Zoloft 100 mg as needed for sleep. -Continue Zoloft 100 mg scheduled -Educated patient on the need to take this on a daily basis.  Hyperbilirubinemia: Acute on chronic. Total bilirubin noted to be 2.3.  Records note elevated indirect bilirubin.  Suspect likely related with splenomegaly noted on CT imaging. -Continue to monitor  Obesity: BMI 37.86 kg/m  DVT prophylaxis: SCDs Code Status: Full Family Communication: Daughter updated at bedside Disposition Plan: Hopefully discharge home once medically stable Consults called: Oncology Admission status: Inpatient  Norval Morton MD [facility]   If 7PM-7AM, please contact night-coverage   10/05/2020, 7:07 AM

## 2020-10-05 NOTE — ED Notes (Signed)
Pt assisted with BSC. Bedding changed to fresh linen and female purewick replaced and changed with fresh one. Pt placed back in bed with call bell within reach.

## 2020-10-05 NOTE — ED Notes (Addendum)
RN called phlebotomy to come stick pt for H&H. IVs do not pull back and RN was unsuccessful x1.

## 2020-10-05 NOTE — ED Notes (Signed)
C/o leg cramping. Daughter at [facility]. Admitting MD into see. Onc into see. 2nd IV started. 1st unit PRBC/blood complete.

## 2020-10-05 NOTE — Progress Notes (Signed)
Heart Failure Navigator Progress Note  Assessed for Heart & Vascular TOC clinic readiness.  Unfortunately at this time the patient does not meet criteria due to already established in HF clinic with Dr. Aundra Dubin.   Navigator available for reassessment of patient.   Kerby Nora, PharmD, BCPS Heart Failure Stewardship Pharmacist Phone [phone]

## 2020-10-06 DIAGNOSIS — R7989 Other specified abnormal findings of blood chemistry: Secondary | ICD-10-CM

## 2020-10-06 LAB — CBC
HCT: 21.4 % — ABNORMAL LOW (ref 36.0–46.0)
Hemoglobin: 7.4 g/dL — ABNORMAL LOW (ref 12.0–15.0)
MCH: 31.9 pg (ref 26.0–34.0)
MCHC: 34.6 g/dL (ref 30.0–36.0)
MCV: 92.2 fL (ref 80.0–100.0)
Platelets: 20 10*3/uL — CL (ref 150–400)
RBC: 2.32 MIL/uL — ABNORMAL LOW (ref 3.87–5.11)
RDW: 17.2 % — ABNORMAL HIGH (ref 11.5–15.5)
WBC: 5.8 10*3/uL (ref 4.0–10.5)
nRBC: 7.5 % — ABNORMAL HIGH (ref 0.0–0.2)

## 2020-10-06 LAB — COMPREHENSIVE METABOLIC PANEL
ALT: 8 U/L (ref 0–44)
AST: 7 U/L — ABNORMAL LOW (ref 15–41)
Albumin: 2.6 g/dL — ABNORMAL LOW (ref 3.5–5.0)
Alkaline Phosphatase: 94 U/L (ref 38–126)
Anion gap: 6 (ref 5–15)
BUN: 17 mg/dL (ref 8–23)
CO2: 26 mmol/L (ref 22–32)
Calcium: 9.5 mg/dL (ref 8.9–10.3)
Chloride: 105 mmol/L (ref 98–111)
Creatinine, Ser: 0.67 mg/dL (ref 0.44–1.00)
GFR, Estimated: >60 mL/min (ref >60)
Glucose, Bld: 157 mg/dL — ABNORMAL HIGH (ref 70–99)
Potassium: 4.2 mmol/L (ref 3.5–5.1)
Sodium: 137 mmol/L (ref 135–145)
Total Bilirubin: 2.1 mg/dL — ABNORMAL HIGH (ref 0.3–1.2)
Total Protein: 5.4 g/dL — ABNORMAL LOW (ref 6.5–8.1)

## 2020-10-06 LAB — BILIRUBIN, DIRECT: Bilirubin, Direct: 0.7 mg/dL — ABNORMAL HIGH (ref 0.0–0.2)

## 2020-10-06 LAB — GLUCOSE, CAPILLARY
Glucose-Capillary: 147 mg/dL — ABNORMAL HIGH (ref 70–99)
Glucose-Capillary: 214 mg/dL — ABNORMAL HIGH (ref 70–99)
Glucose-Capillary: 144 mg/dL — ABNORMAL HIGH (ref 70–99)
Glucose-Capillary: 137 mg/dL — ABNORMAL HIGH (ref 70–99)

## 2020-10-06 LAB — PREPARE RBC (CROSSMATCH)

## 2020-10-06 LAB — PROTIME-INR
INR: 1.6 — ABNORMAL HIGH (ref 0.8–1.2)
Prothrombin Time: 18.6 seconds — ABNORMAL HIGH (ref 11.4–15.2)

## 2020-10-06 LAB — APTT: aPTT: 41 seconds — ABNORMAL HIGH (ref 24–36)

## 2020-10-06 LAB — VITAMIN B12: Vitamin B-12: 455 pg/mL (ref 180–914)

## 2020-10-06 MED ORDER — SODIUM CHLORIDE 0.9% IV SOLUTION: Freq: Once | INTRAVENOUS | Status: AC

## 2020-10-06 NOTE — Plan of Care (Signed)
  Problem: Activity: Goal: Risk for activity intolerance will decrease Outcome: Not Progressing

## 2020-10-06 NOTE — Evaluation (Signed)
Occupational Therapy Evaluation Patient Details Name: Kathy Irwin MRN: [id number] DOB: Apr 25, 1950 Today's Date: 10/06/2020    History of Present Illness 70 y.o. female presents to ED 10/04/20 with continued complaints of SoB and chest tightness. Presented to [facility] with similar complaints 6/17 In ED  HGB 5.1 and received 2 units of PRBC Chest x-ray noted improving bibasilar infiltrates compared to x-ray from 6/17 CT of the chest performed which was negative for PE but did show increase in her splenomegaly. 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  She last had received infusion with romiplostim on 6/14.   Clinical Impression   Pt PTA: Pt Pt living at home with assist from PCA and family for ADL and mobility with Rollator. Pt currently, very limited by SOB and decreased activity tolerance to perform mobility and ADL. Pt's O2 desats to 90% O2 on 3L with dyspneic episode. Pt reports fatigue after minimal exertion and abruptly requires a seated rest break. Pt overally requiring increased assist for OOB ADL set-upA to maxA. Pt minguardA to minA for mobility in room with RW. Pt takes sudden breaks and requires chairs close by or leans on RW against advice form OTR. Pt would greatly benefit from continued OT skilled services for ADL, mobility and safety in SNF setting. OT following acutely.     Follow Up Recommendations  SNF (HHOT if pt refuses)    Equipment Recommendations  None recommended by OT    Recommendations for Other Services       Precautions / Restrictions Precautions Precautions: Fall;Other (comment) Precaution Comments: watch O2 Restrictions Weight Bearing Restrictions: No      Mobility Bed Mobility Overal bed mobility: Needs Assistance Bed Mobility: Supine to Sit     Supine to sit: Supervision;HOB elevated     General bed  mobility comments: supervision for safety    Transfers Overall transfer level: Needs assistance   Transfers: Sit to/from Stand Sit to Stand: Min guard         General transfer comment: min guard for safety from bed surface and armless straight back chair, vc for hand placement and not pulling up on RW    Balance Overall balance assessment: Needs assistance Sitting-balance support: Feet supported;No upper extremity supported Sitting balance-Leahy Scale: Good     Standing balance support: No upper extremity supported;Bilateral upper extremity supported;Single extremity supported Standing balance-Leahy Scale: Fair Standing balance comment: requires UE support for dynamic balance                           ADL either performed or assessed with clinical judgement   ADL Overall ADL's : Needs assistance/impaired Eating/Feeding: Set up   Grooming: Set up;Sitting   Upper Body Bathing: Set up;Sitting   Lower Body Bathing: Maximal assistance;Sitting/lateral leans;Sit to/from stand   Upper Body Dressing : Set up;Sitting   Lower Body Dressing: Maximal assistance;Sitting/lateral leans Lower Body Dressing Details (indicate cue type and reason): attempting to fix socks in bed. Toilet Transfer: Minimal assistance;Ambulation;Comfort height toilet;Grab bars;RW Armed forces technical officer Details (indicate cue type and reason): assist to avoid plopping Toileting- Clothing Manipulation and Hygiene: Moderate assistance;Sit to/from stand Toileting - Clothing Manipulation Details (indicate cue type and reason): can perform anterior pericare in sitting, but requires assist in standing     Functional mobility during ADLs: Min guard;Rolling walker;Cueing for  safety (takes sudden breaks and requires chairs close by or leans on RW against advice form OTR.) General ADL Comments: Pt very limited by SOB and decreased activity tolerance to perform mobility and ADL. Pt's O2 desats to 90% O2 on 3L with  dyspneic episode. Pt reports fatigue after minimal exertion and abruptly requires a seated rest break. Pt overally requiring increased assist for OOB ADL set-upA to maxA.     Vision Baseline Vision/History: Wears glasses Wears Glasses: At all times Patient Visual Report: No change from baseline Vision Assessment?: No apparent visual deficits     Perception     Praxis      Pertinent Vitals/Pain Pain Assessment: 0-10 Pain Score: 7  Pain Location: R hip radiating to calf Pain Descriptors / Indicators: Throbbing;Radiating;Shooting;Discomfort Pain Intervention(s): Limited activity within patient's tolerance     Hand Dominance Right   Extremity/Trunk Assessment Upper Extremity Assessment Upper Extremity Assessment: Generalized weakness   Lower Extremity Assessment Lower Extremity Assessment: RLE deficits/detail;Defer to PT evaluation   Cervical / Trunk Assessment Cervical / Trunk Assessment: Kyphotic   Communication Communication Communication: No difficulties   Cognition Arousal/Alertness: Awake/alert Behavior During Therapy: WFL for tasks assessed/performed;Flat affect Overall Cognitive Status: Within Functional Limits for tasks assessed                                     General Comments  3L with mobility >90% O2.    Exercises     Shoulder Instructions      Home Living Family/patient expects to be discharged to:: Private residence Living Arrangements: Alone Available Help at Discharge: Available PRN/intermittently;Family Type of Home: House Home Access: Stairs to enter [employer] of Steps: 4 Entrance Stairs-Rails: Right;Left Home Layout: One level     Bathroom Shower/Tub: Occupational psychologist: Standard Bathroom Accessibility: Yes   Home Equipment: Bedside commode;Walker - 4 wheels;Grab bars - toilet;Grab bars - tub/shower;Shower seat   Additional Comments: 2-3 L home O2 prn at baseline; pt reports her 4 children  can assist her at home prn      Prior Functioning/Environment Level of Independence: Needs assistance  Gait / Transfers Assistance Needed: since d/c from SNF in 2/22 ambulates limited household distances with Rollator, more often sits on Rollator and has family push her ADL's / Homemaking Assistance Needed: pt reports independence with dressing and bathing if set up, has HHAide from 10-2 and 7-9 who assists with cooking and cleaning and self care            OT Problem List: Decreased activity tolerance;Decreased safety awareness;Cardiopulmonary status limiting activity;Obesity;Increased edema      OT Treatment/Interventions: Self-care/ADL training;Energy conservation;DME and/or AE instruction;Therapeutic activities;Patient/family education;Balance training;Therapeutic exercise    OT Goals(Current goals can be found in the care plan section) Acute Rehab OT Goals Patient Stated Goal: be able to walk again OT Goal Formulation: With patient Time For Goal Achievement: 10/20/20 Potential to Achieve Goals: Good ADL Goals Pt Will Perform Grooming: with modified independence;standing Pt Will Perform Lower Body Dressing: with min assist;with adaptive equipment;sitting/lateral leans Pt Will Transfer to Toilet: with supervision;bedside commode;grab bars;ambulating Pt Will Perform Toileting - Clothing Manipulation and hygiene: with min guard assist;sitting/lateral leans;sit to/from stand Additional ADL Goal #1: Pt will state 3 energy conservation strategies in order to increase independence and activity tolerance Additional ADL Goal #2: Pt will perform OOB ADL tasks x10 mins with O2 sats >90%.  OT  Frequency: Min 2X/week   Barriers to D/C:            Co-evaluation              AM-PAC OT "6 Clicks" Daily Activity     Outcome Measure Help from another person eating meals?: None Help from another person taking care of personal grooming?: A Little Help from another person toileting,  which includes using toliet, bedpan, or urinal?: A Lot Help from another person bathing (including washing, rinsing, drying)?: A Lot Help from another person to put on and taking off regular upper body clothing?: A Little Help from another person to put on and taking off regular lower body clothing?: A Lot 6 Click Score: 16   End of Session Equipment Utilized During Treatment: Gait belt;Rolling walker;Oxygen Nurse Communication: Mobility status;Other (comment) (need for nausea medication)  Activity Tolerance: Patient limited by fatigue Patient left: in bed;with call bell/phone within reach;with bed alarm set  OT Visit Diagnosis: Unsteadiness on feet (R26.81);Muscle weakness (generalized) (M62.81)                Time: 1530-1600 OT Time Calculation (min): 30 min Charges:  OT General Charges $OT Visit: 1 Visit OT Evaluation $OT Eval Moderate Complexity: 1 Mod OT Treatments $Self Care/Home Management : 8-22 mins  Jefferey Pica, OTR/L Acute Rehabilitation Services Pager: [phone] Office: [place] 10/06/2020, 8:36 PM

## 2020-10-06 NOTE — Progress Notes (Signed)
PROGRESS NOTE    Kathy Irwin  EGB:[id number] DOB: Jan 14, 1951 DOA: 10/04/2020 PCP: Kerin Perna, NP    Brief Narrative:  70 year old female with history of CMML, chronic ITP, diastolic heart failure, diabetes, hypertension, presents to the hospital with complaints of shortness of breath.  She was noted to be getting dizzy and breaking out in a sweat when she was standing up.  Hemoglobin noted to be 5.  She was also significantly thrombocytopenic.  CTA chest negative for PE.  She was transfused PRBC and oncology was consulted.   Assessment & Plan:   Active Problems:   Type 2 diabetes mellitus with complication, without long-term current use of insulin (HCC)   HTN (hypertension)   Obesity (BMI 30-39.9)   Chronic diastolic CHF (congestive heart failure) (HCC)   HCAP (healthcare-associated pneumonia)   Hyperbilirubinemia   Idiopathic thrombocytopenia (HCC)   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HCC)   Symptomatic anemia   Symptomatic anemia. -Likely related to CMML and recent chemotherapy -Hemoglobin of 5.1 on admission -This is improved to 7.4 with 2 units of PRBC -Oncology following and has recommended another unit of PRBC today -She does not have any evidence of GI bleeding at this time  SIRS -Possibly related to anemia versus possible infection -Hemodynamics appear to be stabilizing -Follow-up blood cultures  Acute on chronic respiratory failure with hypoxia -She is chronically on 2 to 3 L of oxygen -Oxygen saturations noted to drop down to 70s on ambulation -CT chest was negative for pulmonary embolus -CT did comment on possible interstitial lung disease versus pneumonia -She has been started on intravenous antibiotics  Thrombocytopenia -May possibly related to chemotherapy, she also has a component of ITP -She received Nplate yesterday -Continue to follow  Coagulopathy -Noted to have elevated INR -She is receiving vitamin K -INR trending down  Chronic  diastolic congestive heart failure -Does not appear to be significantly volume overloaded  -she is continued on home dose of Lasix  Diabetes mellitus type 2 -Hold home dose of -Continue on SSI -A1c from 4/25 noted to be 6  Hypertension -Continue on home dose of metoprolol  Splenomegaly -Noted on CT scan, felt to be related to CMML  Generalized weakness -Seen by physical therapy with recommendations for skilled nursing facility   DVT prophylaxis: SCDs Start: 10/05/20 0719  Code Status: Full code Family Communication: Discussed with patient Disposition Plan: Status is: Inpatient  Remains inpatient appropriate because:Ongoing diagnostic testing needed not appropriate for outpatient work up, IV treatments appropriate due to intensity of illness or inability to take PO, and Inpatient level of care appropriate due to severity of illness  Dispo: The patient is from: Home              Anticipated d/c is to: SNF              Patient currently is not medically stable to d/c.   Difficult to place patient No         Consultants:  Oncology  Procedures:    Antimicrobials:  Ceftriaxone 6/21 > Azithromycin 6/21 >   Subjective: She complains of worsening shortness of breath with ambulation.  She is also complaining of some right hip pain.  Objective: Vitals:   10/06/20 0150 10/06/20 0548 10/06/20 0646 10/06/20 1210  BP: (!) 114/49  103/62 (!) 125/59  Pulse: 84  91 (!) 104  Resp: _0 Temp: 98.5 F (36.9 C)  (!) 97.3 F (36.3 C)   TempSrc: Oral  Oral  SpO2: 100%  96% (!) 73%  Weight:  (!) 221.5 kg    Height:        Intake/Output Summary (Last 24 hours) at 10/06/2020 1219 Last data filed at 10/06/2020 0600 Gross per 24 hour  Intake 535 ml  Output 0 ml  Net 535 ml   Filed Weights   10/04/20 1707 10/05/20 1847 10/06/20 0548  Weight: 101.6 kg 97.5 kg (!) 221.5 kg    Examination:  General exam: Appears calm and comfortable  Respiratory system: Clear to  auscultation. Respiratory effort normal. Cardiovascular system: S1 & S2 heard, RRR. No JVD, murmurs, rubs, gallops or clicks. No pedal edema. Gastrointestinal system: Abdomen is nondistended, soft and nontender. No organomegaly or masses felt. Normal bowel sounds heard. Central nervous system: Alert and oriented. No focal neurological deficits. Extremities: Symmetric 5 x 5 power. Skin: No rashes, lesions or ulcers Psychiatry: Judgement and insight appear normal. Mood & affect appropriate.     Data Reviewed: I have personally reviewed following labs and imaging studies  CBC: Recent Labs  Lab 10/01/20 2100 10/04/20 1713 10/05/20 1333 10/06/20 0423  WBC 2.3* 6.1  --  5.8  NEUTROABS  --  1.5*  --   --   HGB 10.4* 5.1* 7.7* 7.4*  HCT 33.1* 16.1* 23.1* 21.4*  MCV 95.4 95.8  --  92.2  PLT 11* 19*  --  20*   Basic Metabolic Panel: Recent Labs  Lab 10/01/20 2100 10/04/20 1713 10/06/20 0423  NA 135 136 137  K 4.4 4.7 4.2  CL 104 105 105  CO2 _0 GLUCOSE 102* 193* 157*  BUN 23 24* 17  CREATININE 0.65 0.76 0.67  CALCIUM 9.1 9.5 9.5   GFR: Estimated Creatinine Clearance: 125.4 mL/min (by C-G formula based on SCr of 0.67 mg/dL). Liver Function Tests: Recent Labs  Lab 10/01/20 2100 10/04/20 1713 10/06/20 0423  AST 9* 11* 7*  ALT _1 ALKPHOS 93 93 94  BILITOT 1.7* 2.3* 2.1*  PROT 5.7* 5.7* 5.4*  ALBUMIN 2.8* 2.8* 2.6*   No results for input(s): LIPASE, AMYLASE in the last 168 hours. No results for input(s): AMMONIA in the last 168 hours. Coagulation Profile: Recent Labs  Lab 10/05/20 0334 10/06/20 0423  INR 1.9* 1.6*   Cardiac Enzymes: No results for input(s): CKTOTAL, CKMB, CKMBINDEX, TROPONINI in the last 168 hours. BNP (last 3 results) No results for input(s): PROBNP in the last 8760 hours. HbA1C: No results for input(s): HGBA1C in the last 72 hours. CBG: Recent Labs  Lab 10/05/20 1153 10/05/20 1724 10/05/20 2055 10/06/20 0642  GLUCAP 176*  164* 169* 147*   Lipid Profile: No results for input(s): CHOL, HDL, LDLCALC, TRIG, CHOLHDL, LDLDIRECT in the last 72 hours. Thyroid Function Tests: No results for input(s): TSH, T4TOTAL, FREET4, T3FREE, THYROIDAB in the last 72 hours. Anemia Panel: Recent Labs    10/05/20 1008 10/06/20 0423  VITAMINB12  --  455  RETICCTPCT 1.7  --    Sepsis Labs: Recent Labs  Lab 10/01/20 1934 10/05/20 1333 10/05/20 2016  PROCALCITON  --  0.48  --   LATICACIDVEN 1.2  --  1.3    Recent Results (from the past 240 hour(s))  Culture, blood (Routine X 2) w Reflex to ID Panel     Status: None (Preliminary result)   Collection Time: 10/01/20  7:34 PM   Specimen: BLOOD  Result Value Ref Range Status   Specimen Description   Final    BLOOD BLOOD  RIGHT ARM Performed at [facility], [place] [address], [place], [place] [address]    Special Requests   Final    BOTTLES DRAWN AEROBIC AND ANAEROBIC Blood Culture adequate volume Performed at [place] [address], [place], [place] [address]    Culture   Final    NO GROWTH 4 DAYS Performed at [facility], [place] [address], [place], [place] [address]    Report Status PENDING  Incomplete  Culture, blood (Routine X 2) w Reflex to ID Panel     Status: None (Preliminary result)   Collection Time: 10/01/20  9:00 PM   Specimen: BLOOD  Result Value Ref Range Status   Specimen Description   Final    BLOOD BLOOD RIGHT HAND Performed at [place] [address] [place], [place] [address]    Special Requests   Final    BOTTLES DRAWN AEROBIC AND ANAEROBIC Blood Culture adequate volume Performed at [place] [address] [place], [place] [address]    Culture   Final    NO GROWTH 4 DAYS Performed at [facility], [place] [address], [place], [place] [address]    Report Status PENDING  Incomplete  SARS CORONAVIRUS 2 (TAT 6-24 HRS) Nasopharyngeal Nasopharyngeal  Swab     Status: None   Collection Time: 10/05/20  2:52 AM   Specimen: Nasopharyngeal Swab  Result Value Ref Range Status   SARS Coronavirus 2 NEGATIVE NEGATIVE Final    Comment: (NOTE) SARS-CoV-2 target nucleic acids are NOT DETECTED.  The SARS-CoV-2 RNA is generally detectable in upper and lower respiratory specimens during the acute phase of infection. Negative results do not preclude SARS-CoV-2 infection, do not rule out co-infections with other pathogens, and should not be used as the sole basis for treatment or other patient management decisions. Negative results must be combined with clinical observations, patient history, and epidemiological information. The expected result is Negative.  Fact Sheet for Patients: SugarRoll.be  Fact Sheet for Healthcare Providers: https://www.woods-mathews.com/  This test is not yet approved or cleared by the [place] FDA and  has been authorized for detection and/or diagnosis of SARS-CoV-2 by FDA under an Emergency Use Authorization (EUA). This EUA will remain  in effect (meaning this test can be used) for the duration of the COVID-19 declaration under Se ction 564(b)(1) of the Act, 21 U.S.C. section 360bbb-3(b)(1), unless the authorization is terminated or revoked sooner.  Performed at [facility], [place] [address] [place], [place] [address]   Culture, blood (routine x 2)     Status: None (Preliminary result)   Collection Time: 10/05/20  8:04 PM   Specimen: BLOOD RIGHT HAND  Result Value Ref Range Status   Specimen Description BLOOD RIGHT HAND  Final   Special Requests   Final    BOTTLES DRAWN AEROBIC AND ANAEROBIC Blood Culture results may not be optimal due to an inadequate volume of blood received in culture bottles   Culture   Final    NO GROWTH < 12 HOURS Performed at [facility], [place] [address] [place],  [address]    Report Status PENDING  Incomplete   Culture, blood (routine x 2)     Status: None (Preliminary result)   Collection Time: 10/05/20  8:05 PM   Specimen: BLOOD LEFT HAND  Result Value Ref Range Status   Specimen Description BLOOD LEFT HAND  Final   Special Requests  Final    BOTTLES DRAWN AEROBIC AND ANAEROBIC Blood Culture results may not be optimal due to an inadequate volume of blood received in culture bottles   Culture   Final    NO GROWTH < 12 HOURS Performed at [facility] [address], [place], [place] [address]    Report Status PENDING  Incomplete         Radiology Studies: DG Chest 2 View  Result Date: 10/04/2020 CLINICAL DATA:  Shortness of breath and chest tightness EXAM: CHEST - 2 VIEW COMPARISON:  10/01/2020 FINDINGS: Cardiac shadow is enlarged but stable. Bilateral basilar infiltrates are seen although slightly improved when compared with the prior study. No sizable effusion is seen. No acute bony abnormality is seen. IMPRESSION: Improving bibasilar infiltrates when compare with the prior study. Electronically Signed   By: Inez Catalina M.D.   On: 10/04/2020 18:03   CT Angio Chest PE W and/or Wo Contrast  Result Date: 10/05/2020 CLINICAL DATA:  PE suspected EXAM: CT ANGIOGRAPHY CHEST WITH CONTRAST TECHNIQUE: Multidetector CT imaging of the chest was performed using the standard protocol during bolus administration of intravenous contrast. Multiplanar CT image reconstructions and MIPs were obtained to evaluate the vascular anatomy. CONTRAST:  163m OMNIPAQUE IOHEXOL 350 MG/ML SOLN COMPARISON:  CT chest pulmonary angiogram, 05/02/2020, CT chest abdomen pelvis angiogram, 08/15/2019 FINDINGS: Cardiovascular: Satisfactory opacification of the pulmonary arteries to the segmental level. No evidence of pulmonary embolism. Mild cardiomegaly. Enlargement of the main pulmonary artery, measuring up to 3.6 cm in caliber. No pericardial effusion. Aortic atherosclerosis. Mediastinum/Nodes: No enlarged mediastinal,  hilar, or axillary lymph nodes. Thyroid gland, trachea, and esophagus demonstrate no significant findings. Lungs/Pleura: Examination of the lung parenchyma is generally somewhat limited by breath motion artifact and expiratory technique. Within this limitation, there appears to be a combination of acute, heterogeneous airspace disease superimposed upon a background of moderate pulmonary fibrosis. Although not well characterized on this examination, predominant characteristics include peribronchovascular irregular peripheral interstitial opacity and ground-glass, some degree of bronchiectasis, and some irregular peripheral interstitial opacity and ground-glass at the bilateral lung bases. General appearance of the lungs is similar when compared to prior examinations. Trace bilateral pleural effusions. Upper Abdomen: No acute abnormality. Splenomegaly in the included upper abdomen, which is markedly increased compared to prior examination, although incompletely imaged the spleen measures at least 16.7 cm. Musculoskeletal: No chest wall abnormality. No acute or significant osseous findings. Review of the MIP images confirms the above findings. IMPRESSION: 1. Negative examination for pulmonary embolism. 2. Examination of the lung parenchyma is generally somewhat limited by breath motion artifact and expiratory technique. Within this limitation, there appears to be a combination of acute, heterogeneous airspace disease superimposed upon a background of moderate pulmonary fibrosis. Although not well characterized on this examination, predominant characteristics include peribronchovascular irregular peripheral interstitial opacity and ground-glass, some degree of bronchiectasis, and some irregular peripheral interstitial opacity and ground-glass at the bilateral lung bases. General appearance of the lungs is similar when compared to prior examinations and suggests fibrosis in an "alternative diagnosis" pattern, leading  differential consideration fibrotic NSIP. Consider ILD protocol CT to further evaluate at the resolution of acute clinical presentation. 3. Trace bilateral pleural effusions. 4. Splenomegaly in the included upper abdomen, which is markedly increased compared to prior examination, although incompletely imaged the spleen measures at least 16.7 cm. 5. Enlargement of the main pulmonary artery, as can be seen in pulmonary hypertension. 6. Cardiomegaly. Aortic Atherosclerosis (ICD10-I70.0). Electronically Signed   By: ACristie Hem  Laqueta Carina M.D.   On: 10/05/2020 09:21        Scheduled Meds:  sodium chloride   Intravenous Once   sodium chloride   Intravenous Once   azithromycin  500 mg Oral Daily   diclofenac Sodium  2 g Topical TID AC & HS   furosemide  40 mg Oral Daily   insulin aspart  0-9 Units Subcutaneous TID WC   linaclotide  145 mcg Oral QAC breakfast   metoprolol tartrate  50 mg Oral q morning   And   metoprolol tartrate  25 mg Oral QPM   olopatadine  1 drop Both Eyes BID   phytonadione  10 mg Oral Daily   pregabalin  100 mg Oral QHS   sertraline  100 mg Oral QHS   sodium chloride flush  3 mL Intravenous Q12H   Continuous Infusions:  cefTRIAXone (ROCEPHIN)  IV Stopped (10/05/20 1432)     LOS: 1 day    Time spent: 17mns    JKathie Dike MD [facility]   If 7PM-7AM, please contact night-coverage [email or web address]  10/06/2020, 12:19 PM

## 2020-10-06 NOTE — Progress Notes (Signed)
IP PROGRESS NOTE  Subjective:   She feels better following the red cell transfusion.  She says she has not ambulated in many months secondary to right hip discomfort.  Objective: Vital signs in last 24 hours: Blood pressure 103/62, pulse 91, temperature (!) 97.3 F (36.3 C), temperature source Oral, resp. rate 16, height _0  (1.626 m), weight (!) 488 lb 5.1 oz (221.5 kg), SpO2 96 %.  Intake/Output from previous day: 06/21 0701 - 06/22 0700 In: 1317.3 [P.O.:120; Blood:1097.3; IV Piggyback:100] Out: 0   Physical Exam:  HEENT: No thrush or bleeding Lungs: Clear bilaterally Cardiac: Regular rate and rhythm, 2/6 systolic murmur Abdomen: Nontender, no hepatosplenomegaly Extremities: No leg edema   Lab Results: Recent Labs    10/04/20 1713 10/05/20 1333 10/06/20 0423  WBC 6.1  --  5.8  HGB 5.1* 7.7* 7.4*  HCT 16.1* 23.1* 21.4*  PLT 19*  --  20*    BMET Recent Labs    10/04/20 1713 10/06/20 0423  NA 136 137  K 4.7 4.2  CL 105 105  CO2 24 26  GLUCOSE 193* 157*  BUN 24* 17  CREATININE 0.76 0.67  CALCIUM 9.5 9.5    No results found for: CEA1  Studies/Results: DG Chest 2 View  Result Date: 10/04/2020 CLINICAL DATA:  Shortness of breath and chest tightness EXAM: CHEST - 2 VIEW COMPARISON:  10/01/2020 FINDINGS: Cardiac shadow is enlarged but stable. Bilateral basilar infiltrates are seen although slightly improved when compared with the prior study. No sizable effusion is seen. No acute bony abnormality is seen. IMPRESSION: Improving bibasilar infiltrates when compare with the prior study. Electronically Signed   By: Inez Catalina M.D.   On: 10/04/2020 18:03   CT Angio Chest PE W and/or Wo Contrast  Result Date: 10/05/2020 CLINICAL DATA:  PE suspected EXAM: CT ANGIOGRAPHY CHEST WITH CONTRAST TECHNIQUE: Multidetector CT imaging of the chest was performed using the standard protocol during bolus administration of intravenous contrast. Multiplanar CT image reconstructions  and MIPs were obtained to evaluate the vascular anatomy. CONTRAST:  146m OMNIPAQUE IOHEXOL 350 MG/ML SOLN COMPARISON:  CT chest pulmonary angiogram, 05/02/2020, CT chest abdomen pelvis angiogram, 08/15/2019 FINDINGS: Cardiovascular: Satisfactory opacification of the pulmonary arteries to the segmental level. No evidence of pulmonary embolism. Mild cardiomegaly. Enlargement of the main pulmonary artery, measuring up to 3.6 cm in caliber. No pericardial effusion. Aortic atherosclerosis. Mediastinum/Nodes: No enlarged mediastinal, hilar, or axillary lymph nodes. Thyroid gland, trachea, and esophagus demonstrate no significant findings. Lungs/Pleura: Examination of the lung parenchyma is generally somewhat limited by breath motion artifact and expiratory technique. Within this limitation, there appears to be a combination of acute, heterogeneous airspace disease superimposed upon a background of moderate pulmonary fibrosis. Although not well characterized on this examination, predominant characteristics include peribronchovascular irregular peripheral interstitial opacity and ground-glass, some degree of bronchiectasis, and some irregular peripheral interstitial opacity and ground-glass at the bilateral lung bases. General appearance of the lungs is similar when compared to prior examinations. Trace bilateral pleural effusions. Upper Abdomen: No acute abnormality. Splenomegaly in the included upper abdomen, which is markedly increased compared to prior examination, although incompletely imaged the spleen measures at least 16.7 cm. Musculoskeletal: No chest wall abnormality. No acute or significant osseous findings. Review of the MIP images confirms the above findings. IMPRESSION: 1. Negative examination for pulmonary embolism. 2. Examination of the lung parenchyma is generally somewhat limited by breath motion artifact and expiratory technique. Within this limitation, there appears to be a combination of acute,  heterogeneous airspace disease superimposed upon a background of moderate pulmonary fibrosis. Although not well characterized on this examination, predominant characteristics include peribronchovascular irregular peripheral interstitial opacity and ground-glass, some degree of bronchiectasis, and some irregular peripheral interstitial opacity and ground-glass at the bilateral lung bases. General appearance of the lungs is similar when compared to prior examinations and suggests fibrosis in an "alternative diagnosis" pattern, leading differential consideration fibrotic NSIP. Consider ILD protocol CT to further evaluate at the resolution of acute clinical presentation. 3. Trace bilateral pleural effusions. 4. Splenomegaly in the included upper abdomen, which is markedly increased compared to prior examination, although incompletely imaged the spleen measures at least 16.7 cm. 5. Enlargement of the main pulmonary artery, as can be seen in pulmonary hypertension. 6. Cardiomegaly. Aortic Atherosclerosis (ICD10-I70.0). Electronically Signed   By: Eddie Candle M.D.   On: 10/05/2020 09:21    Medications: I have reviewed the patient's current medications.  Assessment/Plan: 1. 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  Anemia 3.  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  SIRS 5.  Chronic respiratory failure with hypoxia and interstitial lung disease,?  Healthcare associated pneumonia 6.  Coagulopathy 7.  Diastolic CHF 8.  Hypertension 9.  Diabetes mellitus 10.  Anxiety 11.  Hyperbilirubinemia 12.  Splenomegaly secondary to #1   The hemoglobin is higher after the red cell transfusions yesterday.  The platelet count is stable.  She received nplate yesterday.  There is no indication for platelet transfusion.  I would transfuse 1 additional unit of packed red blood cells. The PT/INR is partially improved with vitamin K Recommendations: Transfuse 1 unit packed red blood  cells Physical therapy, increase ambulation as tolerated  follow-up as scheduled at the cancer center next week Check PT INR 10/08/2020  LOS: 1 day   Betsy Coder, MD   10/06/2020, 7:42 AM

## 2020-10-06 NOTE — Evaluation (Signed)
Physical Therapy Evaluation Patient Details Name: Kathy Irwin MRN: [id number] DOB: Jan 07, 1951 Today's Date: 10/06/2020   History of Present Illness  70 y.o. female presents to ED 10/04/20 with continued complaints of SoB and chest tightness. Presented to [facility] with similar complaints 6/17 In ED  HGB 5.1 and received 2 units of PRBC Chest x-ray noted improving bibasilar infiltrates compared to x-ray from 6/17 CT of the chest performed which was negative for PE but did show increase in her splenomegaly. 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  She last had received infusion with romiplostim on 6/14.  Clinical Impression  PTA pt living alone in single story home with 4 steps to enter. Pt reports using Rollator for household level ambulation. Pt has HHAide daily to assist with self care, cooking and cleaning. Pt is able to dress and bathe with set up. Pt currently limited in safe mobility by R hip pain, and increased O2 demand and SoB with ambulation (see General Comments) in presence of generalized weakness and decreased balance and endurance. Pt is supervision for bed mobility, min guard for transfers and min A for 10 foot ambulation. PT recommending SNF level rehab to regain strength and endurance to be able to climb stairs into house and ambulate with Rollator. PT will continue to follow acutely.     Follow Up Recommendations SNF    Equipment Recommendations  None recommended by PT (has necessary equipment)       Precautions / Restrictions Precautions Precautions: Fall Restrictions Weight Bearing Restrictions: No      Mobility  Bed Mobility Overal bed mobility: Needs Assistance Bed Mobility: Supine to Sit     Supine to sit: Supervision;HOB elevated     General bed mobility comments: supervision for safety    Transfers Overall  transfer level: Needs assistance   Transfers: Sit to/from Stand Sit to Stand: Min guard         General transfer comment: min guard for safety from bed surface and armless straight back chair, vc for hand placement and not pulling up on RW  Ambulation/Gait Ambulation/Gait assistance: Min assist Gait Distance (Feet): 10 Feet (2x10') Assistive device: Rolling walker (2 wheeled) Gait Pattern/deviations: Step-through pattern;Decreased weight shift to right;Decreased stance time - right;Trendelenburg;Trunk flexed Gait velocity: slowed Gait velocity interpretation: <1.31 ft/sec, indicative of household ambulator General Gait Details: light min A for steadying, increased forward flexion, and UE support as ambulation progress due to R hip pain, 4/4 DoE with short distance ambulation requiring seated rest break        Balance Overall balance assessment: Needs assistance Sitting-balance support: Feet supported;No upper extremity supported Sitting balance-Leahy Scale: Good     Standing balance support: No upper extremity supported;Bilateral upper extremity supported;Single extremity supported Standing balance-Leahy Scale: Fair Standing balance comment: requires UE support for dynamic balance                             Pertinent Vitals/Pain Pain Assessment: 0-10 Pain Score: 7  Pain Location: R hip radiating to calf Pain Descriptors / Indicators: Throbbing;Radiating;Shooting;Discomfort Pain Intervention(s): Limited activity within patient's tolerance;Monitored during session;Repositioned;Patient requesting pain meds-RN notified    Home Living Family/patient expects to be discharged to:: Private residence Living Arrangements: Alone Available Help at Discharge: Available PRN/intermittently;Family Type of Home: House Home Access: Stairs to  enter Entrance Stairs-Rails: Right;Left Entrance Stairs-Number of Steps: 4 Home Layout: One level Home Equipment: Bedside  commode;Walker - 4 wheels;Grab bars - toilet;Grab bars - tub/shower;Shower seat Additional Comments: 2-3 L home O2 prn at baseline; pt reports her 4 children can assist her at home prn    Prior Function Level of Independence: Needs assistance   Gait / Transfers Assistance Needed: since d/c from SNF in 2/22 ambulates limited household distances with Rollator, more often sits on Rollator and has family push her  ADL's / Homemaking Assistance Needed: pt reports independence with dressing and bathing if set up, has HHAide from 10-2 and 7-9 who assists with cooking and cleaning and self care        Hand Dominance   Dominant Hand: Right    Extremity/Trunk Assessment   Upper Extremity Assessment Upper Extremity Assessment: Generalized weakness    Lower Extremity Assessment Lower Extremity Assessment: RLE deficits/detail RLE Deficits / Details: hip flexion limited by pain, knee and ankle ROM WFL, strength grossly 3+/5 RLE: Unable to fully assess due to pain       Communication   Communication: No difficulties  Cognition Arousal/Alertness: Awake/alert Behavior During Therapy: WFL for tasks assessed/performed;Flat affect Overall Cognitive Status: Within Functional Limits for tasks assessed                                        General Comments General comments (skin integrity, edema, etc.): Pt on 2-3L O2 at baseline. SaO2 95%O2 on 2L O2 via [place] at rest, SaO2 dropped to 78%O2 with ambulation requiring vc for breathing in through her nose to maximize oxygenation, daughter present throughout session    Exercises General Exercises - Lower Extremity Ankle Circles/Pumps: 20 reps;AROM;Supine   Assessment/Plan    PT Assessment Patient needs continued PT services  PT Problem List Decreased strength;Decreased range of motion;Decreased activity tolerance;Decreased balance;Decreased mobility;Decreased knowledge of use of DME;Decreased safety awareness;Cardiopulmonary status  limiting activity;Pain       PT Treatment Interventions DME instruction;Gait training;Stair training;Functional mobility training;Therapeutic activities;Therapeutic exercise;Balance training;Cognitive remediation;Patient/family education    PT Goals (Current goals can be found in the Care Plan section)  Acute Rehab PT Goals Patient Stated Goal: be able to walk again PT Goal Formulation: With patient/family Time For Goal Achievement: 10/20/20    Frequency Min 2X/week   Barriers to discharge Decreased caregiver support;Inaccessible home environment         AM-PAC PT "6 Clicks" Mobility  Outcome Measure Help needed turning from your back to your side while in a flat bed without using bedrails?: None Help needed moving from lying on your back to sitting on the side of a flat bed without using bedrails?: None Help needed moving to and from a bed to a chair (including a wheelchair)?: A Little Help needed standing up from a chair using your arms (e.g., wheelchair or bedside chair)?: A Little Help needed to walk in hospital room?: A Little Help needed climbing 3-5 steps with a railing? : Total 6 Click Score: 18    End of Session Equipment Utilized During Treatment: Gait belt;Oxygen Activity Tolerance: Patient limited by pain Patient left: in chair;with call bell/phone within reach;with chair alarm set Nurse Communication: Mobility status;Patient requests pain meds PT Visit Diagnosis: Other abnormalities of gait and mobility (R26.89);Muscle weakness (generalized) (M62.81);Difficulty in walking, not elsewhere classified (R26.2);Pain Pain - Right/Left: Right Pain - part of body: Hip  Time: 5320-2334 PT Time Calculation (min) (ACUTE ONLY): 34 min   Charges:   PT Evaluation $PT Eval Moderate Complexity: 1 Mod PT Treatments $Gait Training: 8-22 mins        Jaqwan Wieber B. Migdalia Dk PT, DPT Acute Rehabilitation Services Pager [phone] Office [phone]   [place] 10/06/2020, 10:21 AM

## 2020-10-07 ENCOUNTER — Inpatient Hospital Stay (HOSPITAL_COMMUNITY): Payer: 59

## 2020-10-07 LAB — GLUCOSE, CAPILLARY
Glucose-Capillary: 115 mg/dL — ABNORMAL HIGH (ref 70–99)
Glucose-Capillary: 184 mg/dL — ABNORMAL HIGH (ref 70–99)
Glucose-Capillary: 211 mg/dL — ABNORMAL HIGH (ref 70–99)
Glucose-Capillary: 248 mg/dL — ABNORMAL HIGH (ref 70–99)

## 2020-10-07 LAB — PROTIME-INR
INR: 1.5 — ABNORMAL HIGH (ref 0.8–1.2)
Prothrombin Time: 17.9 seconds — ABNORMAL HIGH (ref 11.4–15.2)

## 2020-10-07 LAB — PREPARE RBC (CROSSMATCH)

## 2020-10-07 LAB — FOLATE RBC
Folate, Hemolysate: 349 ng/mL
Folate, RBC: 1745 ng/mL (ref >498)
Hematocrit: 20 % — ABNORMAL LOW (ref 34.0–46.6)

## 2020-10-07 LAB — CBC
HCT: 21.8 % — ABNORMAL LOW (ref 36.0–46.0)
Hemoglobin: 7.2 g/dL — ABNORMAL LOW (ref 12.0–15.0)
MCH: 31.3 pg (ref 26.0–34.0)
MCHC: 33 g/dL (ref 30.0–36.0)
MCV: 94.8 fL (ref 80.0–100.0)
Platelets: 15 10*3/uL — CL (ref 150–400)
RBC: 2.3 MIL/uL — ABNORMAL LOW (ref 3.87–5.11)
RDW: 16.7 % — ABNORMAL HIGH (ref 11.5–15.5)
WBC: 4.4 10*3/uL (ref 4.0–10.5)
nRBC: 8.5 % — ABNORMAL HIGH (ref 0.0–0.2)

## 2020-10-07 LAB — CULTURE, BLOOD (ROUTINE X 2)
Culture: NO GROWTH
Culture: NO GROWTH
Special Requests: ADEQUATE
Special Requests: ADEQUATE

## 2020-10-07 LAB — APTT: aPTT: 44 seconds — ABNORMAL HIGH (ref 24–36)

## 2020-10-07 MED ORDER — HYDROCODONE-ACETAMINOPHEN 10-325 MG PO TABS
1.0000 | ORAL_TABLET | Freq: Four times a day (QID) | ORAL | Status: DC | PRN
  Administered 2020-10-07 – 2020-10-16 (×11): 1 via ORAL
  Administered 2020-10-17: 2 via ORAL
  Administered 2020-10-17: 1 via ORAL
  Administered 2020-10-18: 2 via ORAL
  Administered 2020-10-19: 1 via ORAL
  Administered 2020-10-19 – 2020-10-20 (×2): 2 via ORAL
  Administered 2020-10-21 – 2020-10-24 (×5): 1 via ORAL
  Filled 2020-10-07 (×2): qty 2
  Filled 2020-10-07 (×11): qty 1
  Filled 2020-10-07: qty 2
  Filled 2020-10-07 (×3): qty 1
  Filled 2020-10-07: qty 2
  Filled 2020-10-07 (×2): qty 1
  Filled 2020-10-07 (×2): qty 2

## 2020-10-07 MED ORDER — SODIUM CHLORIDE 0.9% IV SOLUTION
Freq: Once | INTRAVENOUS | Status: AC
Start: 2020-10-07 — End: 2020-10-07

## 2020-10-07 MED ORDER — FUROSEMIDE 10 MG/ML IJ SOLN
40.0000 mg | Freq: Two times a day (BID) | INTRAMUSCULAR | Status: DC
  Administered 2020-10-07: 40 mg via INTRAVENOUS
  Filled 2020-10-07 (×2): qty 4

## 2020-10-07 MED ORDER — METHYLPREDNISOLONE SODIUM SUCC 125 MG IJ SOLR
60.0000 mg | Freq: Two times a day (BID) | INTRAMUSCULAR | Status: DC
  Administered 2020-10-07 – 2020-10-09 (×4): 60 mg via INTRAVENOUS
  Filled 2020-10-07 (×4): qty 2

## 2020-10-07 NOTE — TOC Initial Note (Signed)
Transition of Care [facility]) - Initial/Assessment Note    Patient Details  Name: Kathy Irwin MRN: [id number] Date of Birth: [birth year]  Transition of Care [facility]) CM/SW Contact:    Sable Feil, LCSW Phone Number: 10/07/2020, 12:07 PM  Clinical Narrative:  Talked with patient at bedside regarding her discharge disposition and recommendation of ST rehab by PT/OT. Kathy Irwin was sitting up in bed and was awake, alert, oriented and agreeable to talking with CSW. The purpose of visit was shared, and when asked, Kathy Irwin responded that she has been to a skilled facility before and although she gave the facility an  "A" for rehab, the care was not good and she does not want to return to that facility. Patient reported that she lives alone and uses a rollator to get around.     Kathy Irwin was given the Medicare.gov list and it was discussed. Patient called her daughter [name] and she joined in on the conversation regarding SNF placement. She expressed agreement with ST rehab hoping that it will be 2 weeks. CSW interjected that it may be longer than 2 weeks. Kathy Irwin provided 2 preferences: [facility] and [facility]. The facility search process was explained that patient was provided with the Medicare.gov list. Daughter [name] also advised regarding [email or web address]. and she went on the web site while on the phone with her mom and CSW.             Expected Discharge Plan: [place] Barriers to Discharge: Continued Medical Work up   Patient Goals and CMS Choice Patient states their goals for this hospitalization and ongoing recovery are:: Patient agreeable to [place] rehab, then home. CMS Medicare.gov Compare Post Acute Care list provided to:: Patient Choice offered to / list presented to : Patient  Expected Discharge Plan and Services Expected Discharge Plan: [place] In-house Referral: Clinical Social Work     Living arrangements for the past 2 months: [place]                                     Prior Living Arrangements/Services Living arrangements for the past 2 months: Single Family Home Lives with:: Self Patient language and need for interpreter reviewed:: Yes Do you feel safe going back to the place where you live?: No   Patient agreeable to [facility] rehab to get stronger before returning home  Need for Family Participation in Patient Care: Yes (Comment) Care giver support system in place?: No (comment)   Criminal Activity/Legal Involvement Pertinent to Current Situation/Hospitalization: No - Comment as needed  Activities of Daily Living Home Assistive Devices/Equipment: CBG Meter, Walker (specify type) ADL Screening (condition at time of admission) Patient's cognitive ability adequate to safely complete daily activities?: Yes Is the patient deaf or have difficulty hearing?: No Does the patient have difficulty seeing, even when wearing glasses/contacts?: No Does the patient have difficulty concentrating, remembering, or making decisions?: No Patient able to express need for assistance with ADLs?: Yes Does the patient have difficulty dressing or bathing?: Yes Independently performs ADLs?: No Communication: Independent Dressing (OT): Needs assistance Is this a change from baseline?: Change from baseline, expected to last >3 days Grooming: Needs assistance Is this a change from baseline?: Change from baseline, expected to last >3 days Feeding: Independent Bathing: Needs assistance Is this a change from baseline?: Change from baseline, expected to last >3 days Toileting: Needs assistance Is  this a change from baseline?: Change from baseline, expected to last >3days In/Out Bed: Needs assistance Is this a change from baseline?: Change from baseline, expected to last >3 days Walks in Home: Needs assistance Is this a change from baseline?: Change from baseline, expected to last >3 days Does the patient have difficulty  walking or climbing stairs?: Yes Weakness of Legs: Both Weakness of Arms/Hands: Both  Permission Sought/Granted Permission sought to share information with : Family Supports Permission granted to share information with : Yes, Verbal Permission Granted  Share Information with NAME: TonyaDewitt     Permission granted to share info w Relationship: Daughter  Permission granted to share info w Contact Information: [phone]  Emotional Assessment Appearance:: Appears stated age Attitude/Demeanor/Rapport: Engaged Affect (typically observed): Pleasant Orientation: : Oriented to Self, Oriented to Place, Oriented to  Time, Oriented to Situation Alcohol / Substance Use: Tobacco Use, Alcohol Use, Illicit Drugs (Per H&P, patieint has never smoked and does not drink or use illicit drugs) Psych Involvement: No (comment)  Admission diagnosis:  Shortness of breath [R06.02] Weakness [R53.1] Tachycardia [R00.0] Elevated brain natriuretic peptide (BNP) level [R79.89] 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] Symptomatic anemia [D64.9] Patient Active Problem List   Diagnosis Date Noted   Weakness 10/05/2020   Symptomatic anemia    Counseling regarding advance care planning and goals of care    Pyogenic arthritis of right hip ([place])    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[place])    Chronic ITP (idiopathic thrombocytopenia) ([place]) 07/06/2020   Hyperbilirubinemia 07/05/2020   Idiopathic thrombocytopenia ([place]) 07/05/2020   Pre-op testing 07/02/2020   Postoperative wound infection of right hip 06/29/2020   HCAP (healthcare-associated pneumonia) 05/05/2020   Closed right hip fracture ([place]) 04/29/2020   Closed intertrochanteric fracture of hip, right, initial encounter ([place]) 04/29/2020   Fall 04/29/2020   Transient hypotension 04/29/2020   Cellulitis of right lower leg 11/09/2019   Chronic diastolic CHF (congestive heart failure) ([place]) 08/02/2019    Atherosclerosis of aorta ([place]) 01/23/2019   Obesity (BMI 30-39.9) 12/24/2018   Hypersensitivity pneumonitis ([place]) 12/19/2017   Type 2 diabetes mellitus with complication, without long-term current use of insulin ([place]) 12/17/2017   HTN (hypertension) 12/17/2017   Essential hypertension 11/05/2017   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[place]) 11/05/2017   PCP:  Kerin Perna, NP Pharmacy:   [id number] [place] [id number]: [phone]: [phone]    Social Determinants of Health (SDOH) Interventions  No SDOH interventions requested or needed at this time.  Readmission Risk Interventions Readmission Risk Prevention Plan 08/12/2019  Transportation Screening Complete  PCP or Specialist Appt within 3-5 Days Complete  [place] or [place]  Social Work Consult for [place]/Counseling Complete  Palliative Care Screening Not Applicable  Medication Review (Press photographer Complete  Some recent data might be hidden

## 2020-10-07 NOTE — Progress Notes (Addendum)
IP PROGRESS NOTE  Subjective:   She reports ongoing shortness of breath and intermittent chest discomfort today.  States that she feels as though she needs another blood transfusion.  No bleeding reported.  Objective: Vital signs in last 24 hours: Blood pressure (!) 106/51, pulse 80, temperature 98.4 F (36.9 C), temperature source Oral, resp. rate 17, height _0  (1.626 m), weight (!) 220.2 kg, SpO2 100 %.  Intake/Output from previous day: 06/22 0701 - 06/23 0700 In: 1032 [P.O.:600; Blood:332; IV Piggyback:100] Out: 0   Physical Exam:  HEENT: No thrush or bleeding Lungs: Clear bilaterally Cardiac: Regular rate and rhythm, 2/6 systolic murmur Abdomen: Nontender, no hepatosplenomegaly Extremities: No leg edema   Lab Results: Recent Labs    10/04/20 1713 10/05/20 1333 10/06/20 0423  WBC 6.1  --  5.8  HGB 5.1* 7.7* 7.4*  HCT 16.1* 23.1* 21.4*  PLT 19*  --  20*    BMET Recent Labs    10/04/20 1713 10/06/20 0423  NA 136 137  K 4.7 4.2  CL 105 105  CO2 24 26  GLUCOSE 193* 157*  BUN 24* 17  CREATININE 0.76 0.67  CALCIUM 9.5 9.5    No results found for: CEA1  Studies/Results: CT Angio Chest PE W and/or Wo Contrast  Result Date: 10/05/2020 CLINICAL DATA:  PE suspected EXAM: CT ANGIOGRAPHY CHEST WITH CONTRAST TECHNIQUE: Multidetector CT imaging of the chest was performed using the standard protocol during bolus administration of intravenous contrast. Multiplanar CT image reconstructions and MIPs were obtained to evaluate the vascular anatomy. CONTRAST:  149m OMNIPAQUE IOHEXOL 350 MG/ML SOLN COMPARISON:  CT chest pulmonary angiogram, 05/02/2020, CT chest abdomen pelvis angiogram, 08/15/2019 FINDINGS: Cardiovascular: Satisfactory opacification of the pulmonary arteries to the segmental level. No evidence of pulmonary embolism. Mild cardiomegaly. Enlargement of the main pulmonary artery, measuring up to 3.6 cm in caliber. No pericardial effusion. Aortic atherosclerosis.  Mediastinum/Nodes: No enlarged mediastinal, hilar, or axillary lymph nodes. Thyroid gland, trachea, and esophagus demonstrate no significant findings. Lungs/Pleura: Examination of the lung parenchyma is generally somewhat limited by breath motion artifact and expiratory technique. Within this limitation, there appears to be a combination of acute, heterogeneous airspace disease superimposed upon a background of moderate pulmonary fibrosis. Although not well characterized on this examination, predominant characteristics include peribronchovascular irregular peripheral interstitial opacity and ground-glass, some degree of bronchiectasis, and some irregular peripheral interstitial opacity and ground-glass at the bilateral lung bases. General appearance of the lungs is similar when compared to prior examinations. Trace bilateral pleural effusions. Upper Abdomen: No acute abnormality. Splenomegaly in the included upper abdomen, which is markedly increased compared to prior examination, although incompletely imaged the spleen measures at least 16.7 cm. Musculoskeletal: No chest wall abnormality. No acute or significant osseous findings. Review of the MIP images confirms the above findings. IMPRESSION: 1. Negative examination for pulmonary embolism. 2. Examination of the lung parenchyma is generally somewhat limited by breath motion artifact and expiratory technique. Within this limitation, there appears to be a combination of acute, heterogeneous airspace disease superimposed upon a background of moderate pulmonary fibrosis. Although not well characterized on this examination, predominant characteristics include peribronchovascular irregular peripheral interstitial opacity and ground-glass, some degree of bronchiectasis, and some irregular peripheral interstitial opacity and ground-glass at the bilateral lung bases. General appearance of the lungs is similar when compared to prior examinations and suggests fibrosis in an  "alternative diagnosis" pattern, leading differential consideration fibrotic NSIP. Consider ILD protocol CT to further evaluate at the resolution of acute  clinical presentation. 3. Trace bilateral pleural effusions. 4. Splenomegaly in the included upper abdomen, which is markedly increased compared to prior examination, although incompletely imaged the spleen measures at least 16.7 cm. 5. Enlargement of the main pulmonary artery, as can be seen in pulmonary hypertension. 6. Cardiomegaly. Aortic Atherosclerosis (ICD10-I70.0). Electronically Signed   By: Eddie Candle M.D.   On: 10/05/2020 09:21    Medications: I have reviewed the patient's current medications.  Assessment/Plan: 1. 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  Anemia 3.  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  SIRS 5.  Chronic respiratory failure with hypoxia and interstitial lung disease,?  Healthcare associated pneumonia 6.  Coagulopathy 7.  Diastolic CHF 8.  Hypertension 9.  Diabetes mellitus 10.  Anxiety 11.  Hyperbilirubinemia 12.  Splenomegaly secondary to #1   Ms. Hollingshead appears unchanged.  Hemoglobin today is 7.2 and she remains symptomatic.  She would benefit from 1 more unit of PRBCs.  Will order today.  Her platelets are down slightly but are overall stable.  She is not actively bleeding.  No need for platelet transfusion at this time.  She received Nplate on 2/37.  The PT/INR is partially improved with vitamin K.  Recommendations: Transfuse 1 unit packed red blood cells Physical therapy, increase ambulation as tolerated Follow-up as previously scheduled at the cancer center Check PT INR 10/08/2020  Please call medical oncology as needed.   LOS: 2 days   Mikey Bussing, NP   10/07/2020, 8:02 AM  Ms. Watterson was interviewed and examined.  She has persistent anemia and thrombocytopenia secondary to chemotherapy and CMML.  The coagulopathy is partially improved with vitamin K.   We will transfuse 1 additional unit of packed red blood cells today.  Please call oncology as needed.  She has an outpatient appointment at the cancer center next week.  I was present for greater than 50% of today's visit.  I performed medical decision making.

## 2020-10-07 NOTE — Progress Notes (Signed)
PROGRESS NOTE    Kathy Irwin  ZOX:[id number] DOB: Apr 01, 1951 DOA: 10/04/2020 PCP: Kerin Perna, NP    Brief Narrative:  70 year old female with history of CMML, chronic ITP, diastolic heart failure, diabetes, hypertension, presents to the hospital with complaints of shortness of breath.  She was noted to be getting dizzy and breaking out in a sweat when she was standing up.  Hemoglobin noted to be 5.  She was also significantly thrombocytopenic.  CTA chest negative for PE.  She was transfused PRBC and oncology was consulted.   Assessment & Plan:   Active Problems:   Type 2 diabetes mellitus with complication, without long-term current use of insulin (HCC)   HTN (hypertension)   Obesity (BMI 30-39.9)   Chronic diastolic CHF (congestive heart failure) (HCC)   HCAP (healthcare-associated pneumonia)   Hyperbilirubinemia   Idiopathic thrombocytopenia (HCC)   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HCC)   Symptomatic anemia   Symptomatic anemia. -Likely related to CMML and recent chemotherapy -Hemoglobin of 5.1 on admission -She has received a total of 3 units prbc thus far  -Oncology following and has recommended another unit of PRBC today, since Hgb 7.2 and she is still short of breath -She does not have any evidence of GI bleeding at this time  SIRS -Possibly related to anemia versus possible infection -Hemodynamics appear to be stabilizing -Blood cultures with no growth  Acute on chronic respiratory failure with hypoxia -She is chronically on 2 to 3 L of oxygen -Oxygen saturations noted to drop down to 70s on ambulation -CT chest was negative for pulmonary embolus -CT did comment on possible interstitial lung disease versus pneumonia -She has been started on intravenous antibiotics -will repeat chest xray today -start on solumedrol to treat any element of pulm fibrosis  Thrombocytopenia -May possibly related to chemotherapy, she also has a component of ITP -She  received Nplate 6/21 -no signs of bleeding -Continue to follow  Coagulopathy -Noted to have elevated INR -She is receiving vitamin K -INR trending down  Chronic diastolic congestive heart failure -she does have some volume overload and elevated bnp -will give a trial of IV lasix  Diabetes mellitus type 2 -Hold home dose of -Continue on SSI -A1c from 4/25 noted to be 6  Hypertension -Continue on home dose of metoprolol  Splenomegaly -Noted on CT scan, felt to be related to CMML  Generalized weakness -Seen by physical therapy with recommendations for skilled nursing facility   DVT prophylaxis: SCDs Start: 10/05/20 0719  Code Status: Full code Family Communication: Discussed with patient Disposition Plan: Status is: Inpatient  Remains inpatient appropriate because:Ongoing diagnostic testing needed not appropriate for outpatient work up, IV treatments appropriate due to intensity of illness or inability to take PO, and Inpatient level of care appropriate due to severity of illness  Dispo: The patient is from: Home              Anticipated d/c is to: SNF              Patient currently is not medically stable to d/c.   Difficult to place patient No     Consultants:  Oncology  Procedures:    Antimicrobials:  Ceftriaxone 6/21 > Azithromycin 6/21 >   Subjective: Still feels very short of breath on minimal exertion. She continues to complain of right hip pain, but admits that it is not significantly different that her chronic pain  Objective: Vitals:   10/06/20 2123 10/06/20 2138 10/07/20 0332 10/07/20 0900  BP: (!) 95/46  (!) 106/51 (!) 102/51  Pulse: 72  80 78  Resp: _0 Temp: 98.4 F (36.9 C)   98.2 F (36.8 C)  TempSrc: Oral   Oral  SpO2: 100%  100% 98%  Weight:  (!) 220.2 kg    Height:        Intake/Output Summary (Last 24 hours) at 10/07/2020 1438 Last data filed at 10/07/2020 1300 Gross per 24 hour  Intake 1092 ml  Output 0 ml  Net 1092  ml   Filed Weights   10/05/20 1847 10/06/20 0548 10/06/20 2138  Weight: 97.5 kg (!) 221.5 kg (!) 220.2 kg    Examination:  General exam: Alert, awake, oriented x 3 Respiratory system: Clear to auscultation. Respiratory effort normal. Cardiovascular system:RRR. No murmurs, rubs, gallops. Gastrointestinal system: Abdomen is nondistended, soft and nontender. No organomegaly or masses felt. Normal bowel sounds heard. Central nervous system: Alert and oriented. No focal neurological deficits. Extremities: 1+ edema bilaterally Skin: No rashes, lesions or ulcers Psychiatry: Judgement and insight appear normal. Mood & affect appropriate.      Data Reviewed: I have personally reviewed following labs and imaging studies  CBC: Recent Labs  Lab 10/01/20 2100 10/04/20 1713 10/05/20 1333 10/06/20 0423 10/07/20 0643  WBC 2.3* 6.1  --  5.8 4.4  NEUTROABS  --  1.5*  --   --   --   HGB 10.4* 5.1* 7.7* 7.4* 7.2*  HCT 33.1* 16.1* 23.1* 21.4*  20.0* 21.8*  MCV 95.4 95.8  --  92.2 94.8  PLT 11* 19*  --  20* 15*   Basic Metabolic Panel: Recent Labs  Lab 10/01/20 2100 10/04/20 1713 10/06/20 0423  NA 135 136 137  K 4.4 4.7 4.2  CL 104 105 105  CO2 _1 GLUCOSE 102* 193* 157*  BUN 23 24* 17  CREATININE 0.65 0.76 0.67  CALCIUM 9.1 9.5 9.5   GFR: Estimated Creatinine Clearance: 124.9 mL/min (by C-G formula based on SCr of 0.67 mg/dL). Liver Function Tests: Recent Labs  Lab 10/01/20 2100 10/04/20 1713 10/06/20 0423  AST 9* 11* 7*  ALT _2 ALKPHOS 93 93 94  BILITOT 1.7* 2.3* 2.1*  PROT 5.7* 5.7* 5.4*  ALBUMIN 2.8* 2.8* 2.6*   No results for input(s): LIPASE, AMYLASE in the last 168 hours. No results for input(s): AMMONIA in the last 168 hours. Coagulation Profile: Recent Labs  Lab 10/05/20 0334 10/06/20 0423 10/07/20 0406  INR 1.9* 1.6* 1.5*   Cardiac Enzymes: No results for input(s): CKTOTAL, CKMB, CKMBINDEX, TROPONINI in the last 168 hours. BNP (last 3  results) No results for input(s): PROBNP in the last 8760 hours. HbA1C: No results for input(s): HGBA1C in the last 72 hours. CBG: Recent Labs  Lab 10/06/20 1217 10/06/20 1624 10/06/20 2136 10/07/20 0643 10/07/20 1145  GLUCAP 214* 144* 137* 115* 184*   Lipid Profile: No results for input(s): CHOL, HDL, LDLCALC, TRIG, CHOLHDL, LDLDIRECT in the last 72 hours. Thyroid Function Tests: No results for input(s): TSH, T4TOTAL, FREET4, T3FREE, THYROIDAB in the last 72 hours. Anemia Panel: Recent Labs    10/05/20 1008 10/06/20 0423  VITAMINB12  --  455  RETICCTPCT 1.7  --    Sepsis Labs: Recent Labs  Lab 10/01/20 1934 10/05/20 1333 10/05/20 2016  PROCALCITON  --  0.48  --   LATICACIDVEN 1.2  --  1.3    Recent Results (from the past 240 hour(s))  Culture, blood (Routine  X 2) w Reflex to ID Panel     Status: None   Collection Time: 10/01/20  7:34 PM   Specimen: BLOOD  Result Value Ref Range Status   Specimen Description   Final    BLOOD BLOOD RIGHT ARM Performed at [facility] [address] [place], [place] [address]    Special Requests   Final    BOTTLES DRAWN AEROBIC AND ANAEROBIC Blood Culture adequate volume Performed at [facility] [address] [place], [place] [address]    Culture   Final    NO GROWTH 5 DAYS Performed at [facility], [place] [address] [place], [place] [address]    Report Status 10/07/2020 FINAL  Final  Culture, blood (Routine X 2) w Reflex to ID Panel     Status: None   Collection Time: 10/01/20  9:00 PM   Specimen: BLOOD  Result Value Ref Range Status   Specimen Description   Final    BLOOD BLOOD RIGHT HAND Performed at [facility] [address] [place], [place] [address]    Special Requests   Final    BOTTLES DRAWN AEROBIC AND ANAEROBIC Blood Culture adequate volume Performed at [facility] [address] [place], [place] [address]    Culture    Final    NO GROWTH 5 DAYS Performed at [facility], [place] [address] [place], [place] [address]    Report Status 10/07/2020 FINAL  Final  SARS CORONAVIRUS 2 (TAT 6-24 HRS) Nasopharyngeal Nasopharyngeal Swab     Status: None   Collection Time: 10/05/20  2:52 AM   Specimen: Nasopharyngeal Swab  Result Value Ref Range Status   SARS Coronavirus 2 NEGATIVE NEGATIVE Final    Comment: (NOTE) SARS-CoV-2 target nucleic acids are NOT DETECTED.  The SARS-CoV-2 RNA is generally detectable in upper and lower respiratory specimens during the acute phase of infection. Negative results do not preclude SARS-CoV-2 infection, do not rule out co-infections with other pathogens, and should not be used as the sole basis for treatment or other patient management decisions. Negative results must be combined with clinical observations, patient history, and epidemiological information. The expected result is Negative.  Fact Sheet for Patients: SugarRoll.be  Fact Sheet for Healthcare Providers: https://www.woods-mathews.com/  This test is not yet approved or cleared by the [place] FDA and  has been authorized for detection and/or diagnosis of SARS-CoV-2 by FDA under an Emergency Use Authorization (EUA). This EUA will remain  in effect (meaning this test can be used) for the duration of the COVID-19 declaration under Se ction 564(b)(1) of the Act, 21 U.S.C. section 360bbb-3(b)(1), unless the authorization is terminated or revoked sooner.  Performed at [facility], [place] [address], [place],  [address]   Culture, blood (routine x 2)     Status: None (Preliminary result)   Collection Time: 10/05/20  8:04 PM   Specimen: BLOOD RIGHT HAND  Result Value Ref Range Status   Specimen Description BLOOD RIGHT HAND  Final   Special Requests   Final    BOTTLES DRAWN AEROBIC AND ANAEROBIC Blood Culture results may not be optimal due to an inadequate  volume of blood received in culture bottles   Culture   Final    NO GROWTH 2 DAYS Performed at [facility], [place] [address] [place],  [address]    Report Status PENDING  Incomplete  Culture, blood (routine x 2)  Status: None (Preliminary result)   Collection Time: 10/05/20  8:05 PM   Specimen: BLOOD LEFT HAND  Result Value Ref Range Status   Specimen Description BLOOD LEFT HAND  Final   Special Requests   Final    BOTTLES DRAWN AEROBIC AND ANAEROBIC Blood Culture results may not be optimal due to an inadequate volume of blood received in culture bottles   Culture   Final    NO GROWTH 2 DAYS Performed at [facility], [place] [address] [place], [place] [address]    Report Status PENDING  Incomplete         Radiology Studies: No results found.      Scheduled Meds:  sodium chloride   Intravenous Once   sodium chloride   Intravenous Once   azithromycin  500 mg Oral Daily   diclofenac Sodium  2 g Topical TID AC & HS   furosemide  40 mg Intravenous BID   insulin aspart  0-9 Units Subcutaneous TID WC   linaclotide  145 mcg Oral QAC breakfast   methylPREDNISolone (SOLU-MEDROL) injection  60 mg Intravenous Q12H   metoprolol tartrate  50 mg Oral q morning   And   metoprolol tartrate  25 mg Oral QPM   olopatadine  1 drop Both Eyes BID   pregabalin  100 mg Oral QHS   sertraline  100 mg Oral QHS   sodium chloride flush  3 mL Intravenous Q12H   Continuous Infusions:  cefTRIAXone (ROCEPHIN)  IV 2 g (10/07/20 1223)     LOS: 2 days    Time spent: 45mns    JKathie Dike MD [facility]   If 7PM-7AM, please contact night-coverage [email or web address]  10/07/2020, 2:38 PM

## 2020-10-07 NOTE — Plan of Care (Signed)
  Problem: Elimination: Goal: Will not experience complications related to bowel motility Outcome: Adequate for Discharge

## 2020-10-08 LAB — BPAM RBC
Blood Product Expiration Date: 202206282359
Blood Product Expiration Date: 202207092359
Blood Product Expiration Date: 202207162359
Blood Product Expiration Date: 202207252359
ISSUE DATE / TIME: 202206210440
ISSUE DATE / TIME: 202206211014
ISSUE DATE / TIME: 202206221614
ISSUE DATE / TIME: 202206231548
Unit Type and Rh: 8400
Unit Type and Rh: 8400
Unit Type and Rh: 8400
Unit Type and Rh: 8400

## 2020-10-08 LAB — BASIC METABOLIC PANEL
Anion gap: 7 (ref 5–15)
BUN: 40 mg/dL — ABNORMAL HIGH (ref 8–23)
CO2: 26 mmol/L (ref 22–32)
Calcium: 9.5 mg/dL (ref 8.9–10.3)
Chloride: 101 mmol/L (ref 98–111)
Creatinine, Ser: 1.06 mg/dL — ABNORMAL HIGH (ref 0.44–1.00)
GFR, Estimated: 57 mL/min — ABNORMAL LOW (ref >60)
Glucose, Bld: 322 mg/dL — ABNORMAL HIGH (ref 70–99)
Potassium: 5.1 mmol/L (ref 3.5–5.1)
Sodium: 134 mmol/L — ABNORMAL LOW (ref 135–145)

## 2020-10-08 LAB — TYPE AND SCREEN
ABO/RH(D): AB POS
Antibody Screen: NEGATIVE
Unit division: 0
Unit division: 0
Unit division: 0
Unit division: 0

## 2020-10-08 LAB — CBC
HCT: 26.7 % — ABNORMAL LOW (ref 36.0–46.0)
Hemoglobin: 8.8 g/dL — ABNORMAL LOW (ref 12.0–15.0)
MCH: 30.6 pg (ref 26.0–34.0)
MCHC: 33 g/dL (ref 30.0–36.0)
MCV: 92.7 fL (ref 80.0–100.0)
Platelets: 16 10*3/uL — CL (ref 150–400)
RBC: 2.88 MIL/uL — ABNORMAL LOW (ref 3.87–5.11)
RDW: 16.5 % — ABNORMAL HIGH (ref 11.5–15.5)
WBC: 4.5 10*3/uL (ref 4.0–10.5)
nRBC: 9.4 % — ABNORMAL HIGH (ref 0.0–0.2)

## 2020-10-08 LAB — GLUCOSE, CAPILLARY
Glucose-Capillary: 347 mg/dL — ABNORMAL HIGH (ref 70–99)
Glucose-Capillary: 361 mg/dL — ABNORMAL HIGH (ref 70–99)
Glucose-Capillary: 363 mg/dL — ABNORMAL HIGH (ref 70–99)
Glucose-Capillary: 418 mg/dL — ABNORMAL HIGH (ref 70–99)

## 2020-10-08 LAB — PROTIME-INR
INR: 1.3 — ABNORMAL HIGH (ref 0.8–1.2)
Prothrombin Time: 16.1 seconds — ABNORMAL HIGH (ref 11.4–15.2)

## 2020-10-08 MED ORDER — INSULIN GLARGINE 100 UNIT/ML ~~LOC~~ SOLN
20.0000 [IU] | Freq: Every day | SUBCUTANEOUS | Status: DC
  Administered 2020-10-08 – 2020-10-24 (×17): 20 [IU] via SUBCUTANEOUS
  Filled 2020-10-08 (×18): qty 0.2

## 2020-10-08 MED ORDER — INSULIN ASPART 100 UNIT/ML IJ SOLN
0.0000 [IU] | Freq: Three times a day (TID) | INTRAMUSCULAR | Status: DC
  Administered 2020-10-08 (×2): 20 [IU] via SUBCUTANEOUS
  Administered 2020-10-09: 11 [IU] via SUBCUTANEOUS
  Administered 2020-10-09: 20 [IU] via SUBCUTANEOUS
  Administered 2020-10-09: 15 [IU] via SUBCUTANEOUS
  Administered 2020-10-10: 11 [IU] via SUBCUTANEOUS
  Administered 2020-10-11: 7 [IU] via SUBCUTANEOUS
  Administered 2020-10-11: 11 [IU] via SUBCUTANEOUS
  Administered 2020-10-11: 15 [IU] via SUBCUTANEOUS
  Administered 2020-10-12: 3 [IU] via SUBCUTANEOUS
  Administered 2020-10-12: 4 [IU] via SUBCUTANEOUS
  Administered 2020-10-12: 11 [IU] via SUBCUTANEOUS
  Administered 2020-10-13 (×2): 4 [IU] via SUBCUTANEOUS
  Administered 2020-10-13: 15 [IU] via SUBCUTANEOUS
  Administered 2020-10-14 (×2): 3 [IU] via SUBCUTANEOUS
  Administered 2020-10-15: 4 [IU] via SUBCUTANEOUS
  Administered 2020-10-15: 7 [IU] via SUBCUTANEOUS
  Administered 2020-10-16: 4 [IU] via SUBCUTANEOUS
  Administered 2020-10-16: 11 [IU] via SUBCUTANEOUS
  Administered 2020-10-17: 7 [IU] via SUBCUTANEOUS
  Administered 2020-10-17: 3 [IU] via SUBCUTANEOUS
  Administered 2020-10-18 (×2): 4 [IU] via SUBCUTANEOUS
  Administered 2020-10-18: 7 [IU] via SUBCUTANEOUS
  Administered 2020-10-19: 11 [IU] via SUBCUTANEOUS
  Administered 2020-10-20: 4 [IU] via SUBCUTANEOUS
  Administered 2020-10-20: 3 [IU] via SUBCUTANEOUS
  Administered 2020-10-20: 11 [IU] via SUBCUTANEOUS
  Administered 2020-10-21: 3 [IU] via SUBCUTANEOUS
  Administered 2020-10-21 – 2020-10-23 (×2): 4 [IU] via SUBCUTANEOUS
  Administered 2020-10-24 – 2020-10-25 (×2): 3 [IU] via SUBCUTANEOUS

## 2020-10-08 MED ORDER — INSULIN ASPART 100 UNIT/ML IJ SOLN
8.0000 [IU] | Freq: Once | INTRAMUSCULAR | Status: AC
  Administered 2020-10-08: 8 [IU] via SUBCUTANEOUS

## 2020-10-08 MED ORDER — IPRATROPIUM-ALBUTEROL 0.5-2.5 (3) MG/3ML IN SOLN
3.0000 mL | Freq: Two times a day (BID) | RESPIRATORY_TRACT | Status: DC
  Administered 2020-10-09 – 2020-10-19 (×20): 3 mL via RESPIRATORY_TRACT
  Filled 2020-10-08 (×23): qty 3

## 2020-10-08 MED ORDER — METHOCARBAMOL 500 MG PO TABS
500.0000 mg | ORAL_TABLET | Freq: Four times a day (QID) | ORAL | Status: DC | PRN
  Administered 2020-10-10 – 2020-10-27 (×26): 500 mg via ORAL
  Filled 2020-10-08 (×26): qty 1

## 2020-10-08 MED ORDER — GUAIFENESIN ER 600 MG PO TB12
600.0000 mg | ORAL_TABLET | Freq: Two times a day (BID) | ORAL | Status: DC
  Administered 2020-10-08 – 2020-10-28 (×40): 600 mg via ORAL
  Filled 2020-10-08 (×40): qty 1

## 2020-10-08 MED ORDER — BUDESONIDE 0.25 MG/2ML IN SUSP
0.2500 mg | Freq: Two times a day (BID) | RESPIRATORY_TRACT | Status: DC
  Administered 2020-10-08 – 2020-10-19 (×21): 0.25 mg via RESPIRATORY_TRACT
  Filled 2020-10-08 (×24): qty 2

## 2020-10-08 MED ORDER — INSULIN ASPART 100 UNIT/ML IJ SOLN
0.0000 [IU] | Freq: Every day | INTRAMUSCULAR | Status: DC
  Administered 2020-10-09: 3 [IU] via SUBCUTANEOUS
  Administered 2020-10-10: 4 [IU] via SUBCUTANEOUS
  Administered 2020-10-11: 5 [IU] via SUBCUTANEOUS
  Administered 2020-10-12 – 2020-10-13 (×2): 4 [IU] via SUBCUTANEOUS
  Administered 2020-10-14 – 2020-10-17 (×4): 3 [IU] via SUBCUTANEOUS
  Administered 2020-10-18: 2 [IU] via SUBCUTANEOUS
  Administered 2020-10-19: 3 [IU] via SUBCUTANEOUS
  Administered 2020-10-20: 2 [IU] via SUBCUTANEOUS
  Administered 2020-10-21 – 2020-10-22 (×2): 3 [IU] via SUBCUTANEOUS
  Administered 2020-10-25: 2 [IU] via SUBCUTANEOUS

## 2020-10-08 MED ORDER — IPRATROPIUM-ALBUTEROL 0.5-2.5 (3) MG/3ML IN SOLN
3.0000 mL | Freq: Four times a day (QID) | RESPIRATORY_TRACT | Status: DC
  Administered 2020-10-08: 3 mL via RESPIRATORY_TRACT
  Filled 2020-10-08: qty 3

## 2020-10-08 NOTE — TOC Progression Note (Signed)
Transition of Care [facility]) - Progression Note    Patient Details  Name: Kathy Irwin MRN: [id number] Date of Birth: [birth year]  [facility] CM/SW Contact  Sharlet Salina Mila Homer, LCSW Phone Number: 10/08/2020, 5:01 PM  Clinical Narrative:  Visited with patient and facility responses provided. Patient informed of the facility that accepted her - [facility], and she does not want to d/c to this facility for rehab. Patient informed re: the SNF's that declined and why and those that did not respond. Ms. Inch was advised that f/u will be made with [facility].  Patient as asked if the search can be expanded [place] and she wants to talk with her daughter first. Patient informed that a Education officer, museum will f/u with her.    Expected Discharge Plan: Skilled Nursing Facility Barriers to Discharge: Continued Medical Work up  Expected Discharge Plan and Services Expected Discharge Plan: [place] In-house Referral: Clinical Social Work     Living arrangements for the past 2 months: Single Family Home                                       Social Determinants of Health (SDOH) Interventions  No SDOH interventions requested or needed at this time  Readmission Risk Interventions Readmission Risk Prevention Plan 08/12/2019  Transportation Screening Complete  PCP or Specialist Appt within 3-5 Days Complete  HRI or [place] Complete  Social Work Consult for [place] Planning/Counseling Complete  Palliative Care Screening Not Applicable  Medication Review Press photographer) Complete  Some recent data might be hidden

## 2020-10-08 NOTE — Care Management Important Message (Signed)
Important Message  Patient Details  Name: Kathy Irwin MRN: [id number] Date of Birth: October 20, 1950   Medicare Important Message Given:  Yes - Important Message mailed due to current National Emergency  Verbal consent obtained due to current National Emergency  Relationship to patient: Self Contact Name: Toree Call Date: 10/08/20  Time: 1303 Phone: [phone] Outcome: No Answer/Busy Important Message mailed to: Patient address on file    Delorse Lek 10/08/2020, 1:03 PM

## 2020-10-08 NOTE — Progress Notes (Signed)
PROGRESS NOTE    Kathy Irwin  FWY:[id number] DOB: 13-Mar-1951 DOA: 10/04/2020 PCP: Kerin Perna, NP    Brief Narrative:  70 year old female with history of CMML, chronic ITP, diastolic heart failure, diabetes, hypertension, presents to the hospital with complaints of shortness of breath.  She was noted to be getting dizzy and breaking out in a sweat when she was standing up.  Hemoglobin noted to be 5.  She was also significantly thrombocytopenic.  CTA chest negative for PE.  She was transfused PRBC and oncology was consulted.   Assessment & Plan:   Active Problems:   Type 2 diabetes mellitus with complication, without long-term current use of insulin (HCC)   HTN (hypertension)   Obesity (BMI 30-39.9)   Chronic diastolic CHF (congestive heart failure) (HCC)   HCAP (healthcare-associated pneumonia)   Hyperbilirubinemia   Idiopathic thrombocytopenia (HCC)   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HCC)   Symptomatic anemia   Symptomatic anemia. -Likely related to CMML and recent chemotherapy -Hemoglobin of 5.1 on admission -She has received a total of 4 units prbc thus far  -follow up Hgb improved to 8.8 -She does not have any evidence of GI bleeding at this time -continue to monitor  SIRS -Possibly related to anemia versus possible infection -Hemodynamics appear to be stabilizing -Blood cultures with no growth  Acute on chronic respiratory failure with hypoxia -She is chronically on 2 to 3 L of oxygen -Oxygen saturations noted to drop down to 70s on ambulation -CT chest was negative for pulmonary embolus -CT did comment on possible interstitial lung disease versus pneumonia -She is currently on intravenous antibiotics -Continue on solumedrol/bronchodilators to treat any element of pulm fibrosis  Thrombocytopenia -May possibly related to chemotherapy, she also has a component of ITP -She received Nplate 6/21 -no signs of bleeding -Continue to  follow  Coagulopathy -Noted to have elevated INR -She is receiving vitamin K -INR trending down  Chronic diastolic congestive heart failure -she does have some volume overload and elevated bnp -she was given IV lasix with bump in BUN/creatinine -hold further diuretics for now  Diabetes mellitus type 2 -Hold home dose of metformin -Continue on SSI -A1c from 4/25 noted to be 6 -blood sugars elevated in light of steroids -will start on lantus  Hypertension -Continue on home dose of metoprolol  Splenomegaly -Noted on CT scan, felt to be related to CMML  Generalized weakness -Seen by physical therapy with recommendations for skilled nursing facility   DVT prophylaxis: SCDs Start: 10/05/20 0719  Code Status: Full code Family Communication: Discussed with patient Disposition Plan: Status is: Inpatient  Remains inpatient appropriate because:Ongoing diagnostic testing needed not appropriate for outpatient work up, IV treatments appropriate due to intensity of illness or inability to take PO, and Inpatient level of care appropriate due to severity of illness  Dispo: The patient is from: Home              Anticipated d/c is to: SNF              Patient currently is not medically stable to d/c.   Difficult to place patient No     Consultants:  Oncology  Procedures:    Antimicrobials:  Ceftriaxone 6/21 > Azithromycin 6/21 >   Subjective: Feels that pain is mildly better today, although not back to baseline yet. Still becomes short of breath on exertion  Objective: Vitals:   10/07/20 2017 10/08/20 0454 10/08/20 1006 10/08/20 1600  BP: 125/68 (!) 106/54 122/66  Pulse: 80 90 78   Resp: _0 Temp: 97.9 F (36.6 C) 97.8 F (36.6 C) (!) 97.3 F (36.3 C)   TempSrc:  Oral Oral   SpO2: 100% 95% 96% 96%  Weight:      Height:        Intake/Output Summary (Last 24 hours) at 10/08/2020 1729 Last data filed at 10/08/2020 1700 Gross per 24 hour  Intake 1096.98  ml  Output 1600 ml  Net -503.02 ml   Filed Weights   10/05/20 1847 10/06/20 0548 10/06/20 2138  Weight: 97.5 kg (!) 221.5 kg (!) 220.2 kg    Examination:  General exam: Alert, awake, oriented x 3 Respiratory system: Clear to auscultation. Respiratory effort normal. Cardiovascular system:RRR. No murmurs, rubs, gallops. Gastrointestinal system: Abdomen is nondistended, soft and nontender. No organomegaly or masses felt. Normal bowel sounds heard. Central nervous system: Alert and oriented. No focal neurological deficits. Extremities: No C/C/E, +pedal pulses Skin: No rashes, lesions or ulcers Psychiatry: Judgement and insight appear normal. Mood & affect appropriate.    Data Reviewed: I have personally reviewed following labs and imaging studies  CBC: Recent Labs  Lab 10/01/20 2100 10/04/20 1713 10/05/20 1333 10/06/20 0423 10/07/20 0643 10/08/20 0322  WBC 2.3* 6.1  --  5.8 4.4 4.5  NEUTROABS  --  1.5*  --   --   --   --   HGB 10.4* 5.1* 7.7* 7.4* 7.2* 8.8*  HCT 33.1* 16.1* 23.1* 21.4*  20.0* 21.8* 26.7*  MCV 95.4 95.8  --  92.2 94.8 92.7  PLT 11* 19*  --  20* 15* 16*   Basic Metabolic Panel: Recent Labs  Lab 10/01/20 2100 10/04/20 1713 10/06/20 0423 10/08/20 0322  NA 135 136 137 134*  K 4.4 4.7 4.2 5.1  CL 104 105 105 101  CO2 _1 GLUCOSE 102* 193* 157* 322*  BUN 23 24* 17 40*  CREATININE 0.65 0.76 0.67 1.06*  CALCIUM 9.1 9.5 9.5 9.5   GFR: Estimated Creatinine Clearance: 94.3 mL/min (A) (by C-G formula based on SCr of 1.06 mg/dL (H)). Liver Function Tests: Recent Labs  Lab 10/01/20 2100 10/04/20 1713 10/06/20 0423  AST 9* 11* 7*  ALT _2 ALKPHOS 93 93 94  BILITOT 1.7* 2.3* 2.1*  PROT 5.7* 5.7* 5.4*  ALBUMIN 2.8* 2.8* 2.6*   No results for input(s): LIPASE, AMYLASE in the last 168 hours. No results for input(s): AMMONIA in the last 168 hours. Coagulation Profile: Recent Labs  Lab 10/05/20 0334 10/06/20 0423 10/07/20 0406  10/08/20 0322  INR 1.9* 1.6* 1.5* 1.3*   Cardiac Enzymes: No results for input(s): CKTOTAL, CKMB, CKMBINDEX, TROPONINI in the last 168 hours. BNP (last 3 results) No results for input(s): PROBNP in the last 8760 hours. HbA1C: No results for input(s): HGBA1C in the last 72 hours. CBG: Recent Labs  Lab 10/07/20 1638 10/07/20 2024 10/08/20 0644 10/08/20 1133 10/08/20 1658  GLUCAP 211* 248* 347* 361* 363*   Lipid Profile: No results for input(s): CHOL, HDL, LDLCALC, TRIG, CHOLHDL, LDLDIRECT in the last 72 hours. Thyroid Function Tests: No results for input(s): TSH, T4TOTAL, FREET4, T3FREE, THYROIDAB in the last 72 hours. Anemia Panel: Recent Labs    10/06/20 0423  VITAMINB12 455   Sepsis Labs: Recent Labs  Lab 10/01/20 1934 10/05/20 1333 10/05/20 2016  PROCALCITON  --  0.48  --   LATICACIDVEN 1.2  --  1.3    Recent Results (from the past  240 hour(s))  Culture, blood (Routine X 2) w Reflex to ID Panel     Status: None   Collection Time: 10/01/20  7:34 PM   Specimen: BLOOD  Result Value Ref Range Status   Specimen Description   Final    BLOOD BLOOD RIGHT ARM Performed at [facility] [address] [place], [place] [address]    Special Requests   Final    BOTTLES DRAWN AEROBIC AND ANAEROBIC Blood Culture adequate volume Performed at [facility] [address] [place], [place] [address]    Culture   Final    NO GROWTH 5 DAYS Performed at [facility], [place] [address] [place], [place] [address]    Report Status 10/07/2020 FINAL  Final  Culture, blood (Routine X 2) w Reflex to ID Panel     Status: None   Collection Time: 10/01/20  9:00 PM   Specimen: BLOOD  Result Value Ref Range Status   Specimen Description   Final    BLOOD BLOOD RIGHT HAND Performed at [facility] [address] [place], [place] [address]    Special Requests   Final    BOTTLES DRAWN AEROBIC AND ANAEROBIC Blood Culture  adequate volume Performed at [facility] [address], [place], [place] [address]    Culture   Final    NO GROWTH 5 DAYS Performed at [facility], [place] [address] [place], [place] [address]    Report Status 10/07/2020 FINAL  Final  SARS CORONAVIRUS 2 (TAT 6-24 HRS) Nasopharyngeal Nasopharyngeal Swab     Status: None   Collection Time: 10/05/20  2:52 AM   Specimen: Nasopharyngeal Swab  Result Value Ref Range Status   SARS Coronavirus 2 NEGATIVE NEGATIVE Final    Comment: (NOTE) SARS-CoV-2 target nucleic acids are NOT DETECTED.  The SARS-CoV-2 RNA is generally detectable in upper and lower respiratory specimens during the acute phase of infection. Negative results do not preclude SARS-CoV-2 infection, do not rule out co-infections with other pathogens, and should not be used as the sole basis for treatment or other patient management decisions. Negative results must be combined with clinical observations, patient history, and epidemiological information. The expected result is Negative.  Fact Sheet for Patients: SugarRoll.be  Fact Sheet for Healthcare Providers: https://www.woods-mathews.com/  This test is not yet approved or cleared by the [place] FDA and  has been authorized for detection and/or diagnosis of SARS-CoV-2 by FDA under an Emergency Use Authorization (EUA). This EUA will remain  in effect (meaning this test can be used) for the duration of the COVID-19 declaration under Se ction 564(b)(1) of the Act, 21 U.S.C. section 360bbb-3(b)(1), unless the authorization is terminated or revoked sooner.  Performed at [facility], [place] [address], [place], [place] [address]   Culture, blood (routine x 2)     Status: None (Preliminary result)   Collection Time: 10/05/20  8:04 PM   Specimen: BLOOD RIGHT HAND  Result Value Ref Range Status   Specimen Description BLOOD RIGHT HAND  Final    Special Requests   Final    BOTTLES DRAWN AEROBIC AND ANAEROBIC Blood Culture results may not be optimal due to an inadequate volume of blood received in culture bottles   Culture   Final    NO GROWTH 3 DAYS Performed at [facility], [place] [address], [place], [place] [address]    Report Status PENDING  Incomplete  Culture, blood (routine x 2)     Status: None (Preliminary result)   Collection Time: 10/05/20  8:05 PM   Specimen: BLOOD LEFT HAND  Result Value Ref Range Status   Specimen Description BLOOD LEFT HAND  Final   Special Requests   Final    BOTTLES DRAWN AEROBIC AND ANAEROBIC Blood Culture results may not be optimal due to an inadequate volume of blood received in culture bottles   Culture   Final    NO GROWTH 3 DAYS Performed at [facility], [place] [address] [place], [place] [address]    Report Status PENDING  Incomplete         Radiology Studies: DG CHEST PORT 1 VIEW  Result Date: 10/07/2020 CLINICAL DATA:  Shortness of breath EXAM: PORTABLE CHEST 1 VIEW COMPARISON:  10/04/2020, 10/01/2020, 09/01/2020 FINDINGS: Cardiomegaly. Underlying chronic interstitial lung disease. Probable superimposed heterogeneous airspace opacities in the lower lungs without significant change. No pneumothorax. IMPRESSION: Interstitial lung disease with no significant change in probable superimposed heterogeneous airspace opacities. Electronically Signed   By: Donavan Foil M.D.   On: 10/07/2020 18:40        Scheduled Meds:  sodium chloride   Intravenous Once   azithromycin  500 mg Oral Daily   diclofenac Sodium  2 g Topical TID AC & HS   insulin aspart  0-20 Units Subcutaneous TID WC   insulin aspart  0-5 Units Subcutaneous QHS   insulin glargine  20 Units Subcutaneous QHS   linaclotide  145 mcg Oral QAC breakfast   methylPREDNISolone (SOLU-MEDROL) injection  60 mg Intravenous Q12H   metoprolol tartrate  50 mg Oral q morning   And   metoprolol tartrate  25 mg Oral QPM    olopatadine  1 drop Both Eyes BID   pregabalin  100 mg Oral QHS   sertraline  100 mg Oral QHS   sodium chloride flush  3 mL Intravenous Q12H   Continuous Infusions:  cefTRIAXone (ROCEPHIN)  IV Stopped (10/08/20 1151)     LOS: 3 days    Time spent: 46mns    JKathie Dike MD [facility]   If 7PM-7AM, please contact night-coverage [email or web address]  10/08/2020, 5:29 PM

## 2020-10-08 NOTE — Progress Notes (Signed)
Inpatient Diabetes Program Recommendations  AACE/ADA: New Consensus Statement on Inpatient Glycemic Control   Target Ranges:  Prepandial:   less than 140 mg/dL      Peak postprandial:   less than 180 mg/dL (1-2 hours)      Critically ill patients:  140 - 180 mg/dL   Results for CHAPEL, SILVERTHORN (MRN [id number]) as of 10/08/2020 09:17  Ref. Range 10/07/2020 06:43 10/07/2020 11:45 10/07/2020 16:38 10/07/2020 20:24 10/08/2020 06:44  Glucose-Capillary Latest Ref Range: 70 - 99 mg/dL 115 (H) 184 (H) 211 (H) 248 (H) 347 (H)    Review of Glycemic Control  Diabetes history: DM2 Outpatient Diabetes medications: Metformin 1000 mg QAM Current orders for Inpatient glycemic control: Novolog 0-9 units TID with meals; Solumedrol 60 mg Q12H  Inpatient Diabetes Program Recommendations:    Insulin: If steroids are continued, please consider ordering Lantus 10 units Q24H. Please consider increasing Novolog correction to 0-15 units AC&HS.  Thanks, Barnie Alderman, RN, MSN, CDE Diabetes Coordinator Inpatient Diabetes Program [phone] (Team Pager from 8am to 5pm)

## 2020-10-08 NOTE — NC FL2 (Signed)
[place] LEVEL OF CARE SCREENING TOOL     IDENTIFICATION  Patient Name: Kathy Irwin Birthdate: 23-Sep-1950 Sex: female Admission Date (Current Location): 10/04/2020  [place] and [place] Number:  Kathy Irwin [id number] [place] and Address:  [facility], [place] [address], [place], [place] [address]      Provider Number: [id number]  Attending Physician Name and Address:  Kathie Dike, MD  Relative Name and Phone Number:  Mackey Birchwood - daughter - [phone]    Current Level of Care: Hospital Recommended Level of Care: [place] Prior Approval Number:    Date Approved/Denied:   PASRR Number: [id number]  Discharge Plan: SNF    Current Diagnoses: Patient Active Problem List   Diagnosis Date Noted   Weakness 10/05/2020   Symptomatic anemia    Counseling regarding advance care planning and goals of care    Pyogenic arthritis of right hip (HCC)    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[place])    Chronic ITP (idiopathic thrombocytopenia) ([place]) 07/06/2020   Hyperbilirubinemia 07/05/2020   Idiopathic thrombocytopenia ([place]) 07/05/2020   Pre-op testing 07/02/2020   Postoperative wound infection of right hip 06/29/2020   HCAP (healthcare-associated pneumonia) 05/05/2020   Closed right hip fracture ([place]) 04/29/2020   Closed intertrochanteric fracture of hip, right, initial encounter ([place]) 04/29/2020   Fall 04/29/2020   Transient hypotension 04/29/2020   Cellulitis of right lower leg 11/09/2019   Chronic diastolic CHF (congestive heart failure) ([place]) 08/02/2019   Atherosclerosis of aorta ([place]) 01/23/2019   Obesity (BMI 30-39.9) 12/24/2018   Hypersensitivity pneumonitis ([place]) 12/19/2017   Type 2 diabetes mellitus with complication, without long-term current use of insulin ([place]) 12/17/2017   HTN (hypertension) 12/17/2017   Essential hypertension 11/05/2017   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[place]) 11/05/2017    Orientation  RESPIRATION BLADDER Height & Weight     Self, Time, Situation, Place  O2 (Patient was on 2L oxygen 6/23. Patient was not on oxygen 6/24) Incontinent, External catheter (Catheter placed 6/21) Weight: (!) 485 lb 7.3 oz (220.2 kg) Height:  _0  (162.6 cm)  BEHAVIORAL SYMPTOMS/MOOD NEUROLOGICAL BOWEL NUTRITION STATUS        Diet (Carb modified)  AMBULATORY STATUS COMMUNICATION OF NEEDS Skin   Limited Assist (Min assist per PT) Verbally Other (Comment) (Ecchymosis abdomen, arm, leg; wart on  right lower buttock)                       Personal Care Assistance Level of Assistance  Bathing, Feeding, Dressing Bathing Assistance: Maximum assistance (Upper body assist with set-up; Lower body max assist) Feeding assistance: Limited assistance (Assistance with set-up) Dressing Assistance: Maximum assistance (Upper body assist with set-up, Lower body max assist)     Functional Limitations Info  Sight, Hearing, Speech Sight Info: Impaired (Blurred vision right eye) Hearing Info: Adequate Speech Info: Adequate    SPECIAL CARE FACTORS FREQUENCY  PT (By licensed PT), OT (By licensed OT)     PT Frequency: Evaluated 6/22. PT at SNF eval and treat, a minimum of 5 days per week OT Frequency: Evaluated 6/22. OT at SNF eval and treat, a minimum of 5 days per week            Contractures      Additional Factors Info  Code Status, Allergies, Insulin Sliding Scale Code Status Info: Full Allergies Info: Ativan (lorazepam), Iodine, Iodine I 131 Tositumomab, Merbromin, Tape   Insulin Sliding Scale Info: 0-9 Units three times per day with  meals       Current Medications (10/08/2020):  This is the current hospital active medication list Current Facility-Administered Medications  Medication Dose Route Frequency Provider Last Rate Last Admin   0.9 %  sodium chloride infusion (Manually program via Guardrails IV Fluids)   Intravenous Once Norval Morton, MD   Held at 10/05/20 0330    acetaminophen (TYLENOL) tablet 650 mg  650 mg Oral Q6H PRN Norval Morton, MD   650 mg at 10/06/20 1041   Or   acetaminophen (TYLENOL) suppository 650 mg  650 mg Rectal Q6H PRN Fuller Plan A, MD       albuterol (PROVENTIL) (2.5 MG/3ML) 0.083% nebulizer solution 2.5 mg  2.5 mg Nebulization Q6H PRN Fuller Plan A, MD       azithromycin (ZITHROMAX) tablet 500 mg  500 mg Oral Daily Smith, Rondell A, MD   500 mg at 10/07/20 1050   cefTRIAXone (ROCEPHIN) 2 g in sodium chloride 0.9 % 100 mL IVPB  2 g Intravenous Q24H Fuller Plan A, MD   Stopped at 10/07/20 1231   diclofenac Sodium (VOLTAREN) 1 % topical gel 2 g  2 g Topical TID AC & HS Smith, Rondell A, MD   2 g at 10/08/20 0750   fluticasone (FLONASE) 50 MCG/ACT nasal spray 2 spray  2 spray Each Nare Daily PRN Fuller Plan A, MD       HYDROcodone-acetaminophen (NORCO) 10-325 MG per tablet 1-2 tablet  1-2 tablet Oral Q6H PRN Kathie Dike, MD   1 tablet at 10/08/20 0445   insulin aspart (novoLOG) injection 0-9 Units  0-9 Units Subcutaneous TID WC Fuller Plan A, MD   7 Units at 10/08/20 0748   linaclotide (LINZESS) capsule 145 mcg  145 mcg Oral QAC breakfast Fuller Plan A, MD   145 mcg at 10/07/20 0845   methylPREDNISolone sodium succinate (SOLU-MEDROL) 125 mg/2 mL injection 60 mg  60 mg Intravenous Q12H Kathie Dike, MD   60 mg at 10/08/20 0447   metoprolol tartrate (LOPRESSOR) tablet 50 mg  50 mg Oral q morning Cala Bradford, RPH   50 mg at 10/07/20 1217   And   metoprolol tartrate (LOPRESSOR) tablet 25 mg  25 mg Oral QPM Cala Bradford, RPH   25 mg at 10/07/20 2132   morphine 2 MG/ML injection 2 mg  2 mg Intravenous Q2H PRN Fuller Plan A, MD       olopatadine (PATANOL) 0.1 % ophthalmic solution 1 drop  1 drop Both Eyes BID Tamala Julian, Rondell A, MD   1 drop at 10/07/20 2132   ondansetron (ZOFRAN) tablet 4 mg  4 mg Oral Q6H PRN Fuller Plan A, MD       Or   ondansetron (ZOFRAN) injection 4 mg  4 mg Intravenous Q6H PRN Fuller Plan A, MD   4 mg at 10/07/20 1217   pregabalin (LYRICA) capsule 100 mg  100 mg Oral QHS Smith, Rondell A, MD   100 mg at 10/07/20 2131   sertraline (ZOLOFT) tablet 100 mg  100 mg Oral QHS Smith, Rondell A, MD   100 mg at 10/07/20 2132   sodium chloride flush (NS) 0.9 % injection 3 mL  3 mL Intravenous Q12H Fuller Plan A, MD   3 mL at 10/07/20 2134     Discharge Medications: Please see discharge summary for a list of discharge medications.  Relevant Imaging Results:  Relevant Lab Results:   Additional Information [phone]. Patient received pfizer vaccinations: 12/01/19,  12/23/19.  Sable Feil, LCSW

## 2020-10-08 NOTE — Plan of Care (Signed)
  Problem: Health Behavior/Discharge Planning: Goal: Ability to manage health-related needs will improve Outcome: Progressing   Problem: Clinical Measurements: Goal: Cardiovascular complication will be avoided Outcome: Progressing   Problem: Activity: Goal: Risk for activity intolerance will decrease Outcome: Progressing   Problem: Safety: Goal: Ability to remain free from injury will improve Outcome: Progressing

## 2020-10-08 NOTE — Progress Notes (Signed)
Physical Therapy Treatment Patient Details Name: Kathy Irwin MRN: [id number] DOB: 06-13-1950 Today's Date: 10/08/2020    History of Present Illness 70 y.o. female presents to ED 10/04/20 with continued complaints of SoB and chest tightness. Presented to [facility] with similar complaints 6/17 In ED  HGB 5.1 and received 2 units of PRBC Chest x-ray noted improving bibasilar infiltrates compared to x-ray from 6/17 CT of the chest performed which was negative for PE but did show increase in her splenomegaly. 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  She last had received infusion with romiplostim on 6/14.    PT Comments    Pt asleep on entry, easily roused however requires a few minutes to orient herself thinking it is Saturday and then self correcting. Once awake pt agreeable to working with therapy. Pt limited in safe mobility by need for 4L supplemental O2, R LE pain and decreased strength and endurance. Pt is minA for bed mobility, min guard for transfers and min A for ambulation with RW. Pt DOE improved from 4/4 during last session to 2/4 today. D/c plan remain appropriate at this time. PT will continue to follow acutely.    Follow Up Recommendations  SNF     Equipment Recommendations  None recommended by PT (has necessary equipment)       Precautions / Restrictions Precautions Precautions: Fall Restrictions Weight Bearing Restrictions: No    Mobility  Bed Mobility Overal bed mobility: Needs Assistance Bed Mobility: Supine to Sit;Sit to Supine     Supine to sit: Supervision;HOB elevated Sit to supine: Min assist   General bed mobility comments: supervision for safety, min A for returning L LE to bed and for scoot up in the bed    Transfers Overall transfer level: Needs assistance   Transfers: Sit to/from Stand;Stand Pivot  Transfers Sit to Stand: Min guard Stand pivot transfers: Min guard       General transfer comment: min guard for safety with sit to stand from bedx2 and for pivot to/from [facility]. good hand placement for power up without cuing  Ambulation/Gait Ambulation/Gait assistance: Min assist Gait Distance (Feet): 20 Feet Assistive device: Rolling walker (2 wheeled) Gait Pattern/deviations: Step-through pattern;Decreased weight shift to right;Decreased stance time - right;Trendelenburg;Trunk flexed Gait velocity: slowed Gait velocity interpretation: <1.31 ft/sec, indicative of household ambulator General Gait Details: contact guard assist for safety         Balance Overall balance assessment: Needs assistance Sitting-balance support: Feet supported;No upper extremity supported Sitting balance-Leahy Scale: Good     Standing balance support: No upper extremity supported;Bilateral upper extremity supported;Single extremity supported Standing balance-Leahy Scale: Fair Standing balance comment: requires UE support for dynamic balance                            Cognition Arousal/Alertness: Awake/alert Behavior During Therapy: WFL for tasks assessed/performed;Flat affect Overall Cognitive Status: Within Functional Limits for tasks assessed                                           General Comments General comments (skin integrity, edema, etc.): Pt on 3L O2 on entry with SaO2 95%O2, with ambulation requires 4L O2 to maintain SaO2 92%O2.  Pertinent Vitals/Pain Pain Assessment: Faces Faces Pain Scale: Hurts a little bit Pain Location: R hip radiating to calf Pain Descriptors / Indicators: Discomfort;Sore;Aching Pain Intervention(s): Limited activity within patient's tolerance;Monitored during session;Repositioned     PT Goals (current goals can now be found in the care plan section) Acute Rehab PT Goals Patient Stated Goal: be able to walk again PT Goal  Formulation: With patient/family Time For Goal Achievement: 10/20/20 Progress towards PT goals: Progressing toward goals    Frequency    Min 2X/week      PT Plan Current plan remains appropriate       AM-PAC PT "6 Clicks" Mobility   Outcome Measure  Help needed turning from your back to your side while in a flat bed without using bedrails?: None Help needed moving from lying on your back to sitting on the side of a flat bed without using bedrails?: None Help needed moving to and from a bed to a chair (including a wheelchair)?: A Little Help needed standing up from a chair using your arms (e.g., wheelchair or bedside chair)?: A Little Help needed to walk in hospital room?: A Little Help needed climbing 3-5 steps with a railing? : Total 6 Click Score: 18    End of Session Equipment Utilized During Treatment: Gait belt;Oxygen Activity Tolerance: Patient tolerated treatment well Patient left: with call bell/phone within reach;in bed;with bed alarm set Nurse Communication: Mobility status PT Visit Diagnosis: Other abnormalities of gait and mobility (R26.89);Muscle weakness (generalized) (M62.81);Difficulty in walking, not elsewhere classified (R26.2);Pain Pain - Right/Left: Right Pain - part of body: Hip     Time: 1275-1700 PT Time Calculation (min) (ACUTE ONLY): 24 min  Charges:  $Therapeutic Exercise: 8-22 mins $Therapeutic Activity: 8-22 mins                     Jernie Schutt B. Migdalia Dk PT, DPT Acute Rehabilitation Services Pager [phone] Office [phone]    [place] 10/08/2020, 1:49 PM

## 2020-10-09 LAB — CBC
HCT: 26.4 % — ABNORMAL LOW (ref 36.0–46.0)
Hemoglobin: 8.9 g/dL — ABNORMAL LOW (ref 12.0–15.0)
MCH: 31.3 pg (ref 26.0–34.0)
MCHC: 33.7 g/dL (ref 30.0–36.0)
MCV: 93 fL (ref 80.0–100.0)
Platelets: 15 10*3/uL — CL (ref 150–400)
RBC: 2.84 MIL/uL — ABNORMAL LOW (ref 3.87–5.11)
RDW: 16.5 % — ABNORMAL HIGH (ref 11.5–15.5)
WBC: 2.9 10*3/uL — ABNORMAL LOW (ref 4.0–10.5)
nRBC: 10.9 % — ABNORMAL HIGH (ref 0.0–0.2)

## 2020-10-09 LAB — RENAL FUNCTION PANEL
Albumin: 2.4 g/dL — ABNORMAL LOW (ref 3.5–5.0)
Anion gap: 10 (ref 5–15)
BUN: 53 mg/dL — ABNORMAL HIGH (ref 8–23)
CO2: 19 mmol/L — ABNORMAL LOW (ref 22–32)
Calcium: 9 mg/dL (ref 8.9–10.3)
Chloride: 102 mmol/L (ref 98–111)
Creatinine, Ser: 1.12 mg/dL — ABNORMAL HIGH (ref 0.44–1.00)
GFR, Estimated: 53 mL/min — ABNORMAL LOW (ref >60)
Glucose, Bld: 407 mg/dL — ABNORMAL HIGH (ref 70–99)
Phosphorus: 5.7 mg/dL — ABNORMAL HIGH (ref 2.5–4.6)
Potassium: 5.3 mmol/L — ABNORMAL HIGH (ref 3.5–5.1)
Sodium: 131 mmol/L — ABNORMAL LOW (ref 135–145)

## 2020-10-09 LAB — GLUCOSE, CAPILLARY
Glucose-Capillary: 343 mg/dL — ABNORMAL HIGH (ref 70–99)
Glucose-Capillary: 367 mg/dL — ABNORMAL HIGH (ref 70–99)
Glucose-Capillary: 267 mg/dL — ABNORMAL HIGH (ref 70–99)
Glucose-Capillary: 256 mg/dL — ABNORMAL HIGH (ref 70–99)

## 2020-10-09 MED ORDER — LACTATED RINGERS IV SOLN: INTRAVENOUS | Status: DC

## 2020-10-09 MED ORDER — WHITE PETROLATUM EX OINT
TOPICAL_OINTMENT | CUTANEOUS | Status: AC
  Filled 2020-10-09: qty 28.35

## 2020-10-09 MED ORDER — PREDNISONE 20 MG PO TABS
40.0000 mg | ORAL_TABLET | Freq: Every day | ORAL | Status: DC
  Administered 2020-10-10 – 2020-10-11 (×2): 40 mg via ORAL
  Filled 2020-10-09 (×2): qty 2

## 2020-10-09 NOTE — Progress Notes (Signed)
PROGRESS NOTE    Ray Glacken  SNK:[id number] DOB: 14-Jun-1950 DOA: 10/04/2020 PCP: Kerin Perna, NP    Brief Narrative:  70 year old female with history of CMML, chronic ITP, diastolic heart failure, diabetes, hypertension, presents to the hospital with complaints of shortness of breath.  She was noted to be getting dizzy and breaking out in a sweat when she was standing up.  Hemoglobin noted to be 5.  She was also significantly thrombocytopenic.  CTA chest negative for PE.  She was transfused PRBC and oncology was consulted.   Assessment & Plan:   Active Problems:   Type 2 diabetes mellitus with complication, without long-term current use of insulin (HCC)   HTN (hypertension)   Obesity (BMI 30-39.9)   Chronic diastolic CHF (congestive heart failure) (HCC)   HCAP (healthcare-associated pneumonia)   Hyperbilirubinemia   Idiopathic thrombocytopenia (HCC)   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HCC)   Symptomatic anemia   Symptomatic anemia. -Likely related to CMML and recent chemotherapy -Hemoglobin of 5.1 on admission -She has received a total of 4 units prbc thus far  -follow up Hgb improved to 8.9 -She does not have any evidence of GI bleeding at this time -continue to monitor  SIRS -Possibly related to anemia versus possible infection -Hemodynamics appear to be stabilizing -Blood cultures with no growth  Acute on chronic respiratory failure with hypoxia -She is chronically on 2 to 3 L of oxygen -Oxygen saturations noted to drop down to 70s on ambulation -CT chest was negative for pulmonary embolus -CT did comment on possible interstitial lung disease versus pneumonia -She is currently on intravenous antibiotics -change solumedrol to prednisone -continue bronchodilators to treat any element of pulm fibrosis  Thrombocytopenia -May possibly related to chemotherapy, she also has a component of ITP -She received Nplate 6/21 -platelet counts remains low at  15K -no signs of bleeding -Continue to follow  Coagulopathy -Noted to have elevated INR -She is receiving vitamin K -INR trending down -1.9->1.3  Chronic diastolic congestive heart failure -currently appears to be euvolemic -since BUN/creatinine have trended up, will give gentle hydration -continue to monitor volume status  Diabetes mellitus type 2 -Hold home dose of metformin -Continue on SSI -A1c from 4/25 noted to be 6 -blood sugars significantly elevated due to steroids -anticipate this should trend down as steroids are tapered -continue on lantus  Hypertension -Continue on home dose of metoprolol  Splenomegaly -Noted on CT scan, felt to be related to CMML  Generalized weakness -Seen by physical therapy with recommendations for skilled nursing facility -CSW is working on placement   DVT prophylaxis: SCDs Start: 10/05/20 0719  Code Status: Full code Family Communication: Discussed with patient Disposition Plan: Status is: Inpatient  Remains inpatient appropriate because:Ongoing diagnostic testing needed not appropriate for outpatient work up, IV treatments appropriate due to intensity of illness or inability to take PO, and Inpatient level of care appropriate due to severity of illness  Dispo: The patient is from: Home              Anticipated d/c is to: SNF              Patient currently is not medically stable to d/c.   Difficult to place patient No     Consultants:  Oncology  Procedures:    Antimicrobials:  Ceftriaxone 6/21 >6/26 Azithromycin 6/21 >6/26   Subjective: Stills feels weak and shaky, overall breathing seems to be improving. She has found the neb treatments to be helpful  Objective:  Vitals:   10/08/20 2056 10/08/20 2059 10/09/20 0529 10/09/20 1020  BP:  (!) 125/58 131/74 139/71  Pulse:  89 80 87  Resp:  _0 Temp:  98 F (36.7 C) 98.3 F (36.8 C) (!) 97.5 F (36.4 C)  TempSrc:  Oral Oral Oral  SpO2: (!) 80% 98% 100% 100%   Weight:      Height:        Intake/Output Summary (Last 24 hours) at 10/09/2020 1048 Last data filed at 10/08/2020 1700 Gross per 24 hour  Intake 241.63 ml  Output 500 ml  Net -258.37 ml   Filed Weights   10/05/20 1847 10/06/20 0548 10/06/20 2138  Weight: 97.5 kg (!) 221.5 kg (!) 220.2 kg    Examination:  General exam: Alert, awake, oriented x 3 Respiratory system: Clear to auscultation. Respiratory effort normal. Cardiovascular system:RRR. No murmurs, rubs, gallops. Gastrointestinal system: Abdomen is nondistended, soft and nontender. No organomegaly or masses felt. Normal bowel sounds heard. Central nervous system: Alert and oriented. No focal neurological deficits. Extremities: No C/C/E, +pedal pulses Skin: No rashes, lesions or ulcers Psychiatry: Judgement and insight appear normal. Mood & affect appropriate.     Data Reviewed: I have personally reviewed following labs and imaging studies  CBC: Recent Labs  Lab 10/04/20 1713 10/05/20 1333 10/06/20 0423 10/07/20 0643 10/08/20 0322 10/09/20 0159  WBC 6.1  --  5.8 4.4 4.5 2.9*  NEUTROABS 1.5*  --   --   --   --   --   HGB 5.1* 7.7* 7.4* 7.2* 8.8* 8.9*  HCT 16.1* 23.1* 21.4*  20.0* 21.8* 26.7* 26.4*  MCV 95.8  --  92.2 94.8 92.7 93.0  PLT 19*  --  20* 15* 16* 15*   Basic Metabolic Panel: Recent Labs  Lab 10/04/20 1713 10/06/20 0423 10/08/20 0322 10/09/20 0159  NA 136 137 134* 131*  K 4.7 4.2 5.1 5.3*  CL 105 105 101 102  CO2 _1 19*  GLUCOSE 193* 157* 322* 407*  BUN 24* 17 40* 53*  CREATININE 0.76 0.67 1.06* 1.12*  CALCIUM 9.5 9.5 9.5 9.0  PHOS  --   --   --  5.7*   GFR: Estimated Creatinine Clearance: 89.2 mL/min (A) (by C-G formula based on SCr of 1.12 mg/dL (H)). Liver Function Tests: Recent Labs  Lab 10/04/20 1713 10/06/20 0423 10/09/20 0159  AST 11* 7*  --   ALT 10 8  --   ALKPHOS 93 94  --   BILITOT 2.3* 2.1*  --   PROT 5.7* 5.4*  --   ALBUMIN 2.8* 2.6* 2.4*   No results for  input(s): LIPASE, AMYLASE in the last 168 hours. No results for input(s): AMMONIA in the last 168 hours. Coagulation Profile: Recent Labs  Lab 10/05/20 0334 10/06/20 0423 10/07/20 0406 10/08/20 0322  INR 1.9* 1.6* 1.5* 1.3*   Cardiac Enzymes: No results for input(s): CKTOTAL, CKMB, CKMBINDEX, TROPONINI in the last 168 hours. BNP (last 3 results) No results for input(s): PROBNP in the last 8760 hours. HbA1C: No results for input(s): HGBA1C in the last 72 hours. CBG: Recent Labs  Lab 10/08/20 0644 10/08/20 1133 10/08/20 1658 10/08/20 2305 10/09/20 0702  GLUCAP 347* 361* 363* 418* 343*   Lipid Profile: No results for input(s): CHOL, HDL, LDLCALC, TRIG, CHOLHDL, LDLDIRECT in the last 72 hours. Thyroid Function Tests: No results for input(s): TSH, T4TOTAL, FREET4, T3FREE, THYROIDAB in the last 72 hours. Anemia Panel: No results for input(s): VITAMINB12,  FOLATE, FERRITIN, TIBC, IRON, RETICCTPCT in the last 72 hours.  Sepsis Labs: Recent Labs  Lab 10/05/20 1333 10/05/20 2016  PROCALCITON 0.48  --   LATICACIDVEN  --  1.3    Recent Results (from the past 240 hour(s))  Culture, blood (Routine X 2) w Reflex to ID Panel     Status: None   Collection Time: 10/01/20  7:34 PM   Specimen: BLOOD  Result Value Ref Range Status   Specimen Description   Final    BLOOD BLOOD RIGHT ARM Performed at [facility] [address], [place], [place] [address]    Special Requests   Final    BOTTLES DRAWN AEROBIC AND ANAEROBIC Blood Culture adequate volume Performed at [facility] [address] [place], [place] [address]    Culture   Final    NO GROWTH 5 DAYS Performed at [facility], [place] [address], [place], [place] [address]    Report Status 10/07/2020 FINAL  Final  Culture, blood (Routine X 2) w Reflex to ID Panel     Status: None   Collection Time: 10/01/20  9:00 PM   Specimen: BLOOD  Result Value Ref Range Status   Specimen  Description   Final    BLOOD BLOOD RIGHT HAND Performed at [facility] [address], [place], [place] [address]    Special Requests   Final    BOTTLES DRAWN AEROBIC AND ANAEROBIC Blood Culture adequate volume Performed at [facility] [address] [place], [place] [address]    Culture   Final    NO GROWTH 5 DAYS Performed at [facility], [place] [address], [place], [place] [address]    Report Status 10/07/2020 FINAL  Final  SARS CORONAVIRUS 2 (TAT 6-24 HRS) Nasopharyngeal Nasopharyngeal Swab     Status: None   Collection Time: 10/05/20  2:52 AM   Specimen: Nasopharyngeal Swab  Result Value Ref Range Status   SARS Coronavirus 2 NEGATIVE NEGATIVE Final    Comment: (NOTE) SARS-CoV-2 target nucleic acids are NOT DETECTED.  The SARS-CoV-2 RNA is generally detectable in upper and lower respiratory specimens during the acute phase of infection. Negative results do not preclude SARS-CoV-2 infection, do not rule out co-infections with other pathogens, and should not be used as the sole basis for treatment or other patient management decisions. Negative results must be combined with clinical observations, patient history, and epidemiological information. The expected result is Negative.  Fact Sheet for Patients: SugarRoll.be  Fact Sheet for Healthcare Providers: https://www.woods-mathews.com/  This test is not yet approved or cleared by the [place] FDA and  has been authorized for detection and/or diagnosis of SARS-CoV-2 by FDA under an Emergency Use Authorization (EUA). This EUA will remain  in effect (meaning this test can be used) for the duration of the COVID-19 declaration under Se ction 564(b)(1) of the Act, 21 U.S.C. section 360bbb-3(b)(1), unless the authorization is terminated or revoked sooner.  Performed at [facility], [place] [address] [place], [place] [address]    Culture, blood (routine x 2)     Status: None (Preliminary result)   Collection Time: 10/05/20  8:04 PM   Specimen: BLOOD RIGHT HAND  Result Value Ref Range Status   Specimen Description BLOOD RIGHT HAND  Final   Special Requests   Final    BOTTLES DRAWN AEROBIC AND ANAEROBIC Blood Culture results may not be optimal due to an inadequate volume  of blood received in culture bottles   Culture   Final    NO GROWTH 4 DAYS Performed at [facility], [place] [address], [place], [place] [address]    Report Status PENDING  Incomplete  Culture, blood (routine x 2)     Status: None (Preliminary result)   Collection Time: 10/05/20  8:05 PM   Specimen: BLOOD LEFT HAND  Result Value Ref Range Status   Specimen Description BLOOD LEFT HAND  Final   Special Requests   Final    BOTTLES DRAWN AEROBIC AND ANAEROBIC Blood Culture results may not be optimal due to an inadequate volume of blood received in culture bottles   Culture   Final    NO GROWTH 4 DAYS Performed at [facility], [place] [address], [place], [place] [address]    Report Status PENDING  Incomplete         Radiology Studies: No results found.      Scheduled Meds:  sodium chloride   Intravenous Once   budesonide (PULMICORT) nebulizer solution  0.25 mg Nebulization BID   diclofenac Sodium  2 g Topical TID AC & HS   guaiFENesin  600 mg Oral BID   insulin aspart  0-20 Units Subcutaneous TID WC   insulin aspart  0-5 Units Subcutaneous QHS   insulin glargine  20 Units Subcutaneous QHS   ipratropium-albuterol  3 mL Nebulization BID   linaclotide  145 mcg Oral QAC breakfast   metoprolol tartrate  50 mg Oral q morning   And   metoprolol tartrate  25 mg Oral QPM   olopatadine  1 drop Both Eyes BID   pregabalin  100 mg Oral QHS   sertraline  100 mg Oral QHS   sodium chloride flush  3 mL Intravenous Q12H   Continuous Infusions:  cefTRIAXone (ROCEPHIN)  IV Stopped (10/08/20 1151)     LOS: 4 days    Time spent:  28mns    JKathie Dike MD [facility]   If 7PM-7AM, please contact night-coverage [email or web address]  10/09/2020, 10:48 AM

## 2020-10-10 LAB — CULTURE, BLOOD (ROUTINE X 2)
Culture: NO GROWTH
Culture: NO GROWTH

## 2020-10-10 LAB — GLUCOSE, CAPILLARY
Glucose-Capillary: 89 mg/dL (ref 70–99)
Glucose-Capillary: 108 mg/dL — ABNORMAL HIGH (ref 70–99)
Glucose-Capillary: 262 mg/dL — ABNORMAL HIGH (ref 70–99)
Glucose-Capillary: 315 mg/dL — ABNORMAL HIGH (ref 70–99)

## 2020-10-10 LAB — RENAL FUNCTION PANEL
Albumin: 2.4 g/dL — ABNORMAL LOW (ref 3.5–5.0)
Anion gap: 5 (ref 5–15)
BUN: 43 mg/dL — ABNORMAL HIGH (ref 8–23)
CO2: 25 mmol/L (ref 22–32)
Calcium: 9.8 mg/dL (ref 8.9–10.3)
Chloride: 107 mmol/L (ref 98–111)
Creatinine, Ser: 0.83 mg/dL (ref 0.44–1.00)
GFR, Estimated: >60 mL/min (ref >60)
Glucose, Bld: 187 mg/dL — ABNORMAL HIGH (ref 70–99)
Phosphorus: 3.8 mg/dL (ref 2.5–4.6)
Potassium: 5.4 mmol/L — ABNORMAL HIGH (ref 3.5–5.1)
Sodium: 137 mmol/L (ref 135–145)

## 2020-10-10 LAB — CBC
HCT: 24.9 % — ABNORMAL LOW (ref 36.0–46.0)
Hemoglobin: 8.3 g/dL — ABNORMAL LOW (ref 12.0–15.0)
MCH: 31.6 pg (ref 26.0–34.0)
MCHC: 33.3 g/dL (ref 30.0–36.0)
MCV: 94.7 fL (ref 80.0–100.0)
Platelets: 14 10*3/uL — CL (ref 150–400)
RBC: 2.63 MIL/uL — ABNORMAL LOW (ref 3.87–5.11)
RDW: 16.3 % — ABNORMAL HIGH (ref 11.5–15.5)
WBC: 2.5 10*3/uL — ABNORMAL LOW (ref 4.0–10.5)
nRBC: 6.8 % — ABNORMAL HIGH (ref 0.0–0.2)

## 2020-10-10 MED ORDER — SODIUM ZIRCONIUM CYCLOSILICATE 10 G PO PACK
10.0000 g | PACK | Freq: Every day | ORAL | Status: DC
  Administered 2020-10-10 – 2020-10-11 (×2): 10 g via ORAL
  Filled 2020-10-10 (×2): qty 1

## 2020-10-10 MED ORDER — FUROSEMIDE 40 MG PO TABS
40.0000 mg | ORAL_TABLET | Freq: Every day | ORAL | Status: DC
  Administered 2020-10-10 – 2020-10-28 (×18): 40 mg via ORAL
  Filled 2020-10-10 (×19): qty 1

## 2020-10-10 NOTE — Progress Notes (Signed)
PROGRESS NOTE    Kathy Irwin  TOI:[id number] DOB: 08-06-1950 DOA: 10/04/2020 PCP: Kerin Perna, NP    Brief Narrative:  70 year old female with history of CMML, chronic ITP, diastolic heart failure, diabetes, hypertension, presents to the hospital with complaints of shortness of breath.  She was noted to be getting dizzy and breaking out in a sweat when she was standing up.  Hemoglobin noted to be 5.  She was also significantly thrombocytopenic.  CTA chest negative for PE.  She was transfused PRBC and oncology was consulted.   Assessment & Plan:   Active Problems:   Type 2 diabetes mellitus with complication, without long-term current use of insulin (HCC)   HTN (hypertension)   Obesity (BMI 30-39.9)   Chronic diastolic CHF (congestive heart failure) (HCC)   HCAP (healthcare-associated pneumonia)   Hyperbilirubinemia   Idiopathic thrombocytopenia (HCC)   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HCC)   Symptomatic anemia   Symptomatic anemia. -Likely related to CMML and recent chemotherapy -Hemoglobin of 5.1 on admission -She has received a total of 4 units prbc thus far  -follow up Hgb improved to 8.9 -She does not have any evidence of GI bleeding at this time -continue to monitor  SIRS -Possibly related to anemia versus possible infection -Hemodynamics appear to be stabilizing -Blood cultures with no growth  Acute on chronic respiratory failure with hypoxia -She is chronically on 2 to 3 L of oxygen -Oxygen saturations noted to drop down to 70s on ambulation -CT chest was negative for pulmonary embolus -CT did comment on possible interstitial lung disease versus pneumonia -She has completed a course of antibiotics -currently on prednisone taper -continue bronchodilators to treat any element of pulm fibrosis  Thrombocytopenia -May possibly related to chemotherapy, she also has a component of ITP -She received Nplate 6/21 -platelet counts remains low at 14K -no  signs of bleeding -Continue to follow  Coagulopathy -Noted to have elevated INR -She is receiving vitamin K -INR trending down -1.9->1.3  Chronic diastolic congestive heart failure -currently appears to be euvolemic -restart home dose of lasix -continue to monitor volume status  Diabetes mellitus type 2 -Hold home dose of metformin -Continue on SSI -A1c from 4/25 noted to be 6 -blood sugars significantly elevated due to steroids -anticipate this should trend down as steroids are tapered -continue on lantus  Hypertension -Continue on home dose of metoprolol  Splenomegaly -Noted on CT scan, felt to be related to CMML  Generalized weakness -Seen by physical therapy with recommendations for skilled nursing facility -CSW is working on placement   DVT prophylaxis: SCDs Start: 10/05/20 0719  Code Status: Full code Family Communication: Discussed with patient Disposition Plan: Status is: Inpatient  Remains inpatient appropriate because:Ongoing diagnostic testing needed not appropriate for outpatient work up, IV treatments appropriate due to intensity of illness or inability to take PO, and Inpatient level of care appropriate due to severity of illness  Dispo: The patient is from: Home              Anticipated d/c is to: SNF              Patient currently is not medically stable to d/c.   Difficult to place patient No     Consultants:  Oncology  Procedures:    Antimicrobials:  Ceftriaxone 6/21 >6/25 Azithromycin 6/21 >6/25   Subjective: Feels neb treatments are helping. Starting to produce white colored sputum.   Objective: Vitals:   10/09/20 2053 10/10/20 0510 10/10/20 0848 10/10/20 1006  BP: (!) 119/55 119/62  (!) 116/55  Pulse: 91 62  86  Resp: _0 Temp: (!) 97.4 F (36.3 C) 98.4 F (36.9 C)  98.5 F (36.9 C)  TempSrc: Oral     SpO2: 98% 99% 98% 94%  Weight:      Height:        Intake/Output Summary (Last 24 hours) at 10/10/2020  1245 Last data filed at 10/10/2020 0510 Gross per 24 hour  Intake 859.72 ml  Output 1700 ml  Net -840.28 ml   Filed Weights   10/05/20 1847 10/06/20 0548 10/06/20 2138  Weight: 97.5 kg (!) 221.5 kg (!) 220.2 kg    Examination:  General exam: Alert, awake, oriented x 3 Respiratory system: bilateral fine crackles. Respiratory effort normal. Cardiovascular system:RRR. No murmurs, rubs, gallops. Gastrointestinal system: Abdomen is nondistended, soft and nontender. No organomegaly or masses felt. Normal bowel sounds heard. Central nervous system: Alert and oriented. No focal neurological deficits. Extremities: 1+ edema bilaterally Skin: No rashes, lesions or ulcers Psychiatry: Judgement and insight appear normal. Mood & affect appropriate.      Data Reviewed: I have personally reviewed following labs and imaging studies  CBC: Recent Labs  Lab 10/04/20 1713 10/05/20 1333 10/06/20 0423 10/07/20 0643 10/08/20 0322 10/09/20 0159 10/10/20 0224  WBC 6.1  --  5.8 4.4 4.5 2.9* 2.5*  NEUTROABS 1.5*  --   --   --   --   --   --   HGB 5.1*   < > 7.4* 7.2* 8.8* 8.9* 8.3*  HCT 16.1*   < > 21.4*  20.0* 21.8* 26.7* 26.4* 24.9*  MCV 95.8  --  92.2 94.8 92.7 93.0 94.7  PLT 19*  --  20* 15* 16* 15* 14*   < > = values in this interval not displayed.   Basic Metabolic Panel: Recent Labs  Lab 10/04/20 1713 10/06/20 0423 10/08/20 0322 10/09/20 0159 10/10/20 0224  NA 136 137 134* 131* 137  K 4.7 4.2 5.1 5.3* 5.4*  CL 105 105 101 102 107  CO2 _1 19* 25  GLUCOSE 193* 157* 322* 407* 187*  BUN 24* 17 40* 53* 43*  CREATININE 0.76 0.67 1.06* 1.12* 0.83  CALCIUM 9.5 9.5 9.5 9.0 9.8  PHOS  --   --   --  5.7* 3.8   GFR: Estimated Creatinine Clearance: 120.4 mL/min (by C-G formula based on SCr of 0.83 mg/dL). Liver Function Tests: Recent Labs  Lab 10/04/20 1713 10/06/20 0423 10/09/20 0159 10/10/20 0224  AST 11* 7*  --   --   ALT 10 8  --   --   ALKPHOS 93 94  --   --    BILITOT 2.3* 2.1*  --   --   PROT 5.7* 5.4*  --   --   ALBUMIN 2.8* 2.6* 2.4* 2.4*   No results for input(s): LIPASE, AMYLASE in the last 168 hours. No results for input(s): AMMONIA in the last 168 hours. Coagulation Profile: Recent Labs  Lab 10/05/20 0334 10/06/20 0423 10/07/20 0406 10/08/20 0322  INR 1.9* 1.6* 1.5* 1.3*   Cardiac Enzymes: No results for input(s): CKTOTAL, CKMB, CKMBINDEX, TROPONINI in the last 168 hours. BNP (last 3 results) No results for input(s): PROBNP in the last 8760 hours. HbA1C: No results for input(s): HGBA1C in the last 72 hours. CBG: Recent Labs  Lab 10/08/20 2305 10/09/20 0702 10/09/20 1133 10/09/20 1614 10/09/20 2125  GLUCAP 418* 343* 367* 267* 256*  Lipid Profile: No results for input(s): CHOL, HDL, LDLCALC, TRIG, CHOLHDL, LDLDIRECT in the last 72 hours. Thyroid Function Tests: No results for input(s): TSH, T4TOTAL, FREET4, T3FREE, THYROIDAB in the last 72 hours. Anemia Panel: No results for input(s): VITAMINB12, FOLATE, FERRITIN, TIBC, IRON, RETICCTPCT in the last 72 hours.  Sepsis Labs: Recent Labs  Lab 10/05/20 1333 10/05/20 2016  PROCALCITON 0.48  --   LATICACIDVEN  --  1.3    Recent Results (from the past 240 hour(s))  Culture, blood (Routine X 2) w Reflex to ID Panel     Status: None   Collection Time: 10/01/20  7:34 PM   Specimen: BLOOD  Result Value Ref Range Status   Specimen Description   Final    BLOOD BLOOD RIGHT ARM Performed at [facility] [address] [place], [place] [address]    Special Requests   Final    BOTTLES DRAWN AEROBIC AND ANAEROBIC Blood Culture adequate volume Performed at [facility] [address], [place], [place] [address]    Culture   Final    NO GROWTH 5 DAYS Performed at [facility], [place] [address], [place], [place] [address]    Report Status 10/07/2020 FINAL  Final  Culture, blood (Routine X 2) w Reflex to ID Panel     Status:  None   Collection Time: 10/01/20  9:00 PM   Specimen: BLOOD  Result Value Ref Range Status   Specimen Description   Final    BLOOD BLOOD RIGHT HAND Performed at [facility] [address] [place], [place] [address]    Special Requests   Final    BOTTLES DRAWN AEROBIC AND ANAEROBIC Blood Culture adequate volume Performed at [facility] [address] [place], [place] [address]    Culture   Final    NO GROWTH 5 DAYS Performed at [facility], [place] [address], [place], [place] [address]    Report Status 10/07/2020 FINAL  Final  SARS CORONAVIRUS 2 (TAT 6-24 HRS) Nasopharyngeal Nasopharyngeal Swab     Status: None   Collection Time: 10/05/20  2:52 AM   Specimen: Nasopharyngeal Swab  Result Value Ref Range Status   SARS Coronavirus 2 NEGATIVE NEGATIVE Final    Comment: (NOTE) SARS-CoV-2 target nucleic acids are NOT DETECTED.  The SARS-CoV-2 RNA is generally detectable in upper and lower respiratory specimens during the acute phase of infection. Negative results do not preclude SARS-CoV-2 infection, do not rule out co-infections with other pathogens, and should not be used as the sole basis for treatment or other patient management decisions. Negative results must be combined with clinical observations, patient history, and epidemiological information. The expected result is Negative.  Fact Sheet for Patients: SugarRoll.be  Fact Sheet for Healthcare Providers: https://www.woods-mathews.com/  This test is not yet approved or cleared by the [place] FDA and  has been authorized for detection and/or diagnosis of SARS-CoV-2 by FDA under an Emergency Use Authorization (EUA). This EUA will remain  in effect (meaning this test can be used) for the duration of the COVID-19 declaration under Se ction 564(b)(1) of the Act, 21 U.S.C. section 360bbb-3(b)(1), unless the authorization is terminated  or revoked sooner.  Performed at [facility], [place] [address], [place], [place] [address]   Culture, blood (routine x 2)     Status: None   Collection Time: 10/05/20  8:04 PM   Specimen: BLOOD RIGHT HAND  Result Value  Ref Range Status   Specimen Description BLOOD RIGHT HAND  Final   Special Requests   Final    BOTTLES DRAWN AEROBIC AND ANAEROBIC Blood Culture results may not be optimal due to an inadequate volume of blood received in culture bottles   Culture   Final    NO GROWTH 5 DAYS Performed at [facility], [place] [address], [place], [place] [address]    Report Status 10/10/2020 FINAL  Final  Culture, blood (routine x 2)     Status: None   Collection Time: 10/05/20  8:05 PM   Specimen: BLOOD LEFT HAND  Result Value Ref Range Status   Specimen Description BLOOD LEFT HAND  Final   Special Requests   Final    BOTTLES DRAWN AEROBIC AND ANAEROBIC Blood Culture results may not be optimal due to an inadequate volume of blood received in culture bottles   Culture   Final    NO GROWTH 5 DAYS Performed at [facility], [place] [address], [place], [place] [address]    Report Status 10/10/2020 FINAL  Final         Radiology Studies: No results found.      Scheduled Meds:  sodium chloride   Intravenous Once   budesonide (PULMICORT) nebulizer solution  0.25 mg Nebulization BID   diclofenac Sodium  2 g Topical TID AC & HS   furosemide  40 mg Oral Daily   guaiFENesin  600 mg Oral BID   insulin aspart  0-20 Units Subcutaneous TID WC   insulin aspart  0-5 Units Subcutaneous QHS   insulin glargine  20 Units Subcutaneous QHS   ipratropium-albuterol  3 mL Nebulization BID   linaclotide  145 mcg Oral QAC breakfast   metoprolol tartrate  50 mg Oral q morning   And   metoprolol tartrate  25 mg Oral QPM   olopatadine  1 drop Both Eyes BID   predniSONE  40 mg Oral Q breakfast   pregabalin  100 mg Oral QHS   sertraline  100 mg Oral QHS   sodium chloride flush  3  mL Intravenous Q12H   sodium zirconium cyclosilicate  10 g Oral Daily   Continuous Infusions:     LOS: 5 days    Time spent: 60mns    JKathie Dike MD [facility]   If 7PM-7AM, please contact night-coverage [email or web address]  10/10/2020, 12:45 PM

## 2020-10-11 ENCOUNTER — Ambulatory Visit: Payer: 59 | Admitting: Pulmonary Disease

## 2020-10-11 ENCOUNTER — Other Ambulatory Visit: Payer: 59

## 2020-10-11 ENCOUNTER — Ambulatory Visit: Payer: 59

## 2020-10-11 LAB — CBC
HCT: 24.7 % — ABNORMAL LOW (ref 36.0–46.0)
Hemoglobin: 7.9 g/dL — ABNORMAL LOW (ref 12.0–15.0)
MCH: 31 pg (ref 26.0–34.0)
MCHC: 32 g/dL (ref 30.0–36.0)
MCV: 96.9 fL (ref 80.0–100.0)
Platelets: 13 10*3/uL — CL (ref 150–400)
RBC: 2.55 MIL/uL — ABNORMAL LOW (ref 3.87–5.11)
RDW: 16.1 % — ABNORMAL HIGH (ref 11.5–15.5)
WBC: 1.7 10*3/uL — ABNORMAL LOW (ref 4.0–10.5)
nRBC: 8 % — ABNORMAL HIGH (ref 0.0–0.2)

## 2020-10-11 LAB — BASIC METABOLIC PANEL
Anion gap: 8 (ref 5–15)
BUN: 31 mg/dL — ABNORMAL HIGH (ref 8–23)
CO2: 25 mmol/L (ref 22–32)
Calcium: 9.3 mg/dL (ref 8.9–10.3)
Chloride: 100 mmol/L (ref 98–111)
Creatinine, Ser: 0.89 mg/dL (ref 0.44–1.00)
GFR, Estimated: >60 mL/min (ref >60)
Glucose, Bld: 415 mg/dL — ABNORMAL HIGH (ref 70–99)
Potassium: 4.6 mmol/L (ref 3.5–5.1)
Sodium: 133 mmol/L — ABNORMAL LOW (ref 135–145)

## 2020-10-11 LAB — GLUCOSE, CAPILLARY
Glucose-Capillary: 336 mg/dL — ABNORMAL HIGH (ref 70–99)
Glucose-Capillary: 217 mg/dL — ABNORMAL HIGH (ref 70–99)
Glucose-Capillary: 256 mg/dL — ABNORMAL HIGH (ref 70–99)
Glucose-Capillary: 388 mg/dL — ABNORMAL HIGH (ref 70–99)

## 2020-10-11 LAB — PROTIME-INR
INR: 1.2 (ref 0.8–1.2)
Prothrombin Time: 15.6 seconds — ABNORMAL HIGH (ref 11.4–15.2)

## 2020-10-11 MED ORDER — TRAZODONE HCL 100 MG PO TABS
100.0000 mg | ORAL_TABLET | Freq: Every day | ORAL | Status: DC
  Administered 2020-10-11 – 2020-10-27 (×16): 100 mg via ORAL
  Filled 2020-10-11 (×17): qty 1

## 2020-10-11 MED ORDER — PREDNISONE 20 MG PO TABS
30.0000 mg | ORAL_TABLET | Freq: Every day | ORAL | Status: DC
  Administered 2020-10-12 – 2020-10-22 (×11): 30 mg via ORAL
  Filled 2020-10-11 (×11): qty 1

## 2020-10-11 MED ORDER — INSULIN ASPART 100 UNIT/ML IJ SOLN
3.0000 [IU] | Freq: Three times a day (TID) | INTRAMUSCULAR | Status: DC
  Administered 2020-10-12 – 2020-10-24 (×31): 3 [IU] via SUBCUTANEOUS

## 2020-10-11 NOTE — TOC Progression Note (Signed)
Transition of Care [facility]) - Progression Note    Patient Details  Name: Kathy Irwin MRN: [id number] Date of Birth: September 19, 1950  Transition of Care [facility] CM/SW Contact  Kathy Salina Mila Homer, LCSW Phone Number: 10/11/2020, 6:20 PM  Clinical Narrative:  CSW talked with patient a couple of times today regarding her discharge. Initialy talked with patient regarding [facility] reviewing her information to determine if they could accept her for ST rehab. While running her insurance [employer] (admissions Mudlogger) found an open [employer] claim and was opened April 29, 2020. Star requested to know if claim should be closed or if patient is waiting to be paid. She informed CSW that the Kathy Irwin comp company is [employer]. Per Star, if this is a claim that should be closed, the patient needs to be in touch with [employer]  Visited with patient and her daughter, Kathy Irwin regarding the workman's compensation claim. 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 Per daughter, the Kathy Irwin comp claim was not filed until April 2022 and her mom has received one check so far last week, and the dates on the check, per daughter was 1/13-09/20/20. CSW explained how the workman's compensation claim impacts the patient going to SNF as this is considered the primary payor, not her [place]. Patient and daughter informed that if the claim is completed and needs to be closed, verification of this closure would be needed in writing.   CSW received a call from Kathy Irwin (5:17 pm) a case manager with [employer] [phone]). CSW was advised that Kathy Irwin sister is her employer. Kathy Irwin had concerns regarding patient's readiness for discharge and was informed that this would have to be discussed with the doctor.  CSW will follow-up with patient regarding the workman's compensation claim.    Expected Discharge Plan:  Skilled Nursing Facility Barriers to Discharge: Continued Medical Work up  Expected Discharge Plan and Services Expected Discharge Plan: [place] In-house Referral: Clinical Social Work     Living arrangements for the past 2 months: Single Family Home                                       Social Determinants of Health (SDOH) Interventions    Readmission Risk Interventions Readmission Risk Prevention Plan 08/12/2019  Transportation Screening Complete  PCP or Specialist Appt within 3-5 Days Complete  HRI or [place] Complete  Social Work Consult for [place] Planning/Counseling Complete  Palliative Care Screening Not Applicable  Medication Review Press photographer) Complete  Some recent data might be hidden

## 2020-10-11 NOTE — Progress Notes (Signed)
Physical Therapy Treatment Patient Details Name: Kathy Irwin MRN: [id number] DOB: 06-13-1950 Today's Date: 10/11/2020    History of Present Illness 70 y.o. female presents to ED 10/04/20 with continued complaints of SoB and chest tightness. Presented to [facility] with similar complaints 6/17 In ED  HGB 5.1 and received 2 units of PRBC Chest x-ray noted improving bibasilar infiltrates compared to x-ray from 6/17 CT of the chest performed which was negative for PE but did show increase in her splenomegaly. 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  She last had received infusion with romiplostim on 6/14.    PT Comments    Pt lying on R side with head resting on bed rail on entry, reporting that her R side felt better that way. Pt is agreeable to work with therapy. Pt is limited in safe mobility by R LE pain, increased O2 demand (see General Comments) in presence of decreased strength and endurance. Pt requires supervision for bed mobility and min guard for transfers and ambulation of 20 feet with RW. Pt participated in LE exercises. D/c plans remain appropriate at this time. PT will continue to follow acutely.    Follow Up Recommendations  SNF     Equipment Recommendations  None recommended by PT (has necessary equipment)       Precautions / Restrictions Precautions Precautions: Fall Restrictions Weight Bearing Restrictions: No    Mobility  Bed Mobility Overal bed mobility: Needs Assistance Bed Mobility: Supine to Sit;Sit to Supine     Supine to sit: Supervision;HOB elevated     General bed mobility comments: supervision for safety    Transfers Overall transfer level: Needs assistance   Transfers: Sit to/from Stand;Stand Pivot Transfers Sit to Stand: Min guard         General transfer comment: min guard for safety, good hand  placement for power up  Ambulation/Gait Ambulation/Gait assistance: Min guard Gait Distance (Feet): 20 Feet (2x20') Assistive device: Rolling walker (2 wheeled) Gait Pattern/deviations: Step-through pattern;Decreased weight shift to right;Decreased stance time - right;Trendelenburg;Trunk flexed Gait velocity: slowed Gait velocity interpretation: <1.31 ft/sec, indicative of household ambulator General Gait Details: min guard for safety, improved weightbearin on R LE         Balance Overall balance assessment: Needs assistance Sitting-balance support: Feet supported;No upper extremity supported Sitting balance-Leahy Scale: Good     Standing balance support: No upper extremity supported;Bilateral upper extremity supported;Single extremity supported Standing balance-Leahy Scale: Fair Standing balance comment: requires UE support for dynamic balance                            Cognition Arousal/Alertness: Awake/alert Behavior During Therapy: WFL for tasks assessed/performed;Flat affect Overall Cognitive Status: Within Functional Limits for tasks assessed                                        Exercises General Exercises - Lower Extremity Long Arc Quad: AROM;Both;10 reps;Seated Hip Flexion/Marching: AROM;Both;10 reps;Seated Toe Raises: AROM;Both;10 reps;Seated Heel Raises: AROM;Both;10 reps;Seated    General Comments General comments (skin integrity, edema, etc.): Pt 90%O2 on RA, with ambulation on RA SaO2 dropped to 75%O2, with seated rest break SaO2 rebounded to 84%O2, placed on 3L via [place] and SaO2  rebounded to 96%O2. ambulated on 3L O2 via [place] and SaO2 dropped to 86%O2, vc for pursed lipped breathing with emphasis on slow deep breath in.      Pertinent Vitals/Pain Pain Assessment: Faces Faces Pain Scale: Hurts a little bit Pain Location: R hip radiating to calf Pain Descriptors / Indicators: Discomfort;Sore;Aching Pain Intervention(s): Limited  activity within patient's tolerance;Monitored during session;Repositioned     PT Goals (current goals can now be found in the care plan section) Acute Rehab PT Goals Patient Stated Goal: be able to walk again PT Goal Formulation: With patient/family Time For Goal Achievement: 10/20/20 Progress towards PT goals: Progressing toward goals    Frequency    Min 2X/week      PT Plan Current plan remains appropriate       AM-PAC PT "6 Clicks" Mobility   Outcome Measure  Help needed turning from your back to your side while in a flat bed without using bedrails?: None Help needed moving from lying on your back to sitting on the side of a flat bed without using bedrails?: None Help needed moving to and from a bed to a chair (including a wheelchair)?: A Little Help needed standing up from a chair using your arms (e.g., wheelchair or bedside chair)?: A Little Help needed to walk in hospital room?: A Little Help needed climbing 3-5 steps with a railing? : Total 6 Click Score: 18    End of Session Equipment Utilized During Treatment: Gait belt;Oxygen Activity Tolerance: Patient tolerated treatment well Patient left: with call bell/phone within reach;in bed;with bed alarm set Nurse Communication: Mobility status PT Visit Diagnosis: Other abnormalities of gait and mobility (R26.89);Muscle weakness (generalized) (M62.81);Difficulty in walking, not elsewhere classified (R26.2);Pain Pain - Right/Left: Right Pain - part of body: Hip     Time: 8546-2703 PT Time Calculation (min) (ACUTE ONLY): 26 min  Charges:  $Gait Training: 8-22 mins $Therapeutic Exercise: 8-22 mins                     [place] Petron B. Migdalia Dk PT, DPT Acute Rehabilitation Services Pager [phone] Office [phone]    [place] 10/11/2020, 2:30 PM

## 2020-10-11 NOTE — Plan of Care (Signed)
  Problem: Clinical Measurements: Goal: Ability to maintain clinical measurements within normal limits will improve Outcome: Progressing Goal: Respiratory complications will improve Outcome: Progressing   Problem: Activity: Goal: Risk for activity intolerance will decrease Outcome: Progressing

## 2020-10-11 NOTE — Progress Notes (Addendum)
PROGRESS NOTE    Kathy Irwin  LNL:[id number] DOB: [birth year] DOA: 10/04/2020 PCP: Kerin Perna, NP    Brief Narrative:  70 year old female with history of CMML, chronic ITP, diastolic heart failure, diabetes, hypertension, presents to the hospital with complaints of shortness of breath.  She was noted to be getting dizzy and breaking out in a sweat when she was standing up.  Hemoglobin noted to be 5.  She was also significantly thrombocytopenic.  CTA chest negative for PE.  She was transfused PRBC and oncology was consulted.   Assessment & Plan:   Active Problems:   Type 2 diabetes mellitus with complication, without long-term current use of insulin (HCC)   HTN (hypertension)   Obesity (BMI 30-39.9)   Chronic diastolic CHF (congestive heart failure) (HCC)   HCAP (healthcare-associated pneumonia)   Hyperbilirubinemia   Idiopathic thrombocytopenia (HCC)   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HCC)   Symptomatic anemia   Symptomatic anemia. -Likely related to CMML and recent chemotherapy -Hemoglobin of 5.1 on admission -She has received a total of 4 units prbc thus far  -follow up Hgb improved to 8.9->7.9 -She does not have any evidence of GI bleeding at this time -continue to monitor  SIRS -Possibly related to anemia versus possible infection -Hemodynamics appear to be stabilizing -Blood cultures with no growth  Acute on chronic respiratory failure with hypoxia -She is chronically on 2 to 3 L of oxygen -CT chest was negative for pulmonary embolus -CT did comment on possible interstitial lung disease versus pneumonia -She has completed a course of antibiotics -currently on prednisone taper -continue bronchodilators to treat any element of pulm fibrosis -Overall respiratory status improving  Thrombocytopenia -May possibly related to chemotherapy, she also has a component of ITP -She received Nplate 6/21, next dose is due tomorrow -platelet counts remains low at  13K -no signs of bleeding -Continue to follow  Pancytopenia -multifactorial related to chemotherapy, CMML and ITP -continue supportive treatments  Coagulopathy -Noted to have elevated INR -She is receiving vitamin K -INR trending down -1.9->1.3  Chronic diastolic congestive heart failure -currently appears to be euvolemic -r continue home dose of lasix -continue to monitor volume status  Diabetes mellitus type 2 -Hold home dose of metformin -Continue on SSI -A1c from 4/25 noted to be 6 -blood sugars significantly elevated due to steroids -anticipate this should trend down as steroids are tapered -continue on lantus  AKI -likely related to IV diuretics -treated with limited IV fluids -now resolved  Hypertension -Continue on home dose of metoprolol  Splenomegaly -Noted on CT scan, felt to be related to CMML  Generalized weakness -Seen by physical therapy with recommendations for skilled nursing facility -CSW is working on placement   DVT prophylaxis: SCDs Start: 10/05/20 0719  Code Status: Full code Family Communication: Discussed with patient Disposition Plan: Status is: Inpatient  Remains inpatient appropriate because:Ongoing diagnostic testing needed not appropriate for outpatient work up, IV treatments appropriate due to intensity of illness or inability to take PO, and Inpatient level of care appropriate due to severity of illness  Dispo: The patient is from: Home              Anticipated d/c is to: SNF              Patient currently is not medically stable to d/c.   Difficult to place patient No     Consultants:  Oncology  Procedures:    Antimicrobials:  Ceftriaxone 6/21 >6/25 Azithromycin 6/21 >6/25  Subjective: Patient says she is feeling better.  She ambulated to the bathroom today without any oxygen.  Feels that her breathing is improving.  Objective: Vitals:   10/11/20 0556 10/11/20 0833 10/11/20 1008 10/11/20 1650  BP:   (!)  130/58 (!) 114/54  Pulse:   90 86  Resp:   18 16  Temp:   98.6 F (37 C) 98.6 F (37 C)  TempSrc:   Oral Axillary  SpO2:  96% 95% 93%  Weight: 98.3 kg     Height:        Intake/Output Summary (Last 24 hours) at 10/11/2020 1937 Last data filed at 10/11/2020 1733 Gross per 24 hour  Intake 837 ml  Output 3400 ml  Net -2563 ml   Filed Weights   10/06/20 0548 10/06/20 2138 10/11/20 0556  Weight: (!) 221.5 kg (!) 220.2 kg 98.3 kg    Examination:  General exam: Alert, awake, oriented x 3 Respiratory system: Clear to auscultation. Respiratory effort normal. Cardiovascular system:RRR. No murmurs, rubs, gallops. Gastrointestinal system: Abdomen is nondistended, soft and nontender. No organomegaly or masses felt. Normal bowel sounds heard. Central nervous system: Alert and oriented. No focal neurological deficits. Extremities: No C/C/E, +pedal pulses Skin: No rashes, lesions or ulcers Psychiatry: Judgement and insight appear normal. Mood & affect appropriate.       Data Reviewed: I have personally reviewed following labs and imaging studies  CBC: Recent Labs  Lab 10/07/20 0643 10/08/20 0322 10/09/20 0159 10/10/20 0224 10/11/20 0036  WBC 4.4 4.5 2.9* 2.5* 1.7*  HGB 7.2* 8.8* 8.9* 8.3* 7.9*  HCT 21.8* 26.7* 26.4* 24.9* 24.7*  MCV 94.8 92.7 93.0 94.7 96.9  PLT 15* 16* 15* 14* 13*   Basic Metabolic Panel: Recent Labs  Lab 10/06/20 0423 10/08/20 0322 10/09/20 0159 10/10/20 0224 10/11/20 0036  NA 137 134* 131* 137 133*  K 4.2 5.1 5.3* 5.4* 4.6  CL 105 101 102 107 100  CO2 26 26 19* 25 25  GLUCOSE 157* 322* 407* 187* 415*  BUN 17 40* 53* 43* 31*  CREATININE 0.67 1.06* 1.12* 0.83 0.89  CALCIUM 9.5 9.5 9.0 9.8 9.3  PHOS  --   --  5.7* 3.8  --    GFR: Estimated Creatinine Clearance: 66.9 mL/min (by C-G formula based on SCr of 0.89 mg/dL). Liver Function Tests: Recent Labs  Lab 10/06/20 0423 10/09/20 0159 10/10/20 0224  AST 7*  --   --   ALT 8  --   --    ALKPHOS 94  --   --   BILITOT 2.1*  --   --   PROT 5.4*  --   --   ALBUMIN 2.6* 2.4* 2.4*   No results for input(s): LIPASE, AMYLASE in the last 168 hours. No results for input(s): AMMONIA in the last 168 hours. Coagulation Profile: Recent Labs  Lab 10/05/20 0334 10/06/20 0423 10/07/20 0406 10/08/20 0322 10/11/20 0036  INR 1.9* 1.6* 1.5* 1.3* 1.2   Cardiac Enzymes: No results for input(s): CKTOTAL, CKMB, CKMBINDEX, TROPONINI in the last 168 hours. BNP (last 3 results) No results for input(s): PROBNP in the last 8760 hours. HbA1C: No results for input(s): HGBA1C in the last 72 hours. CBG: Recent Labs  Lab 10/10/20 1610 10/10/20 2123 10/11/20 0552 10/11/20 1201 10/11/20 1648  GLUCAP 262* 315* 336* 217* 256*   Lipid Profile: No results for input(s): CHOL, HDL, LDLCALC, TRIG, CHOLHDL, LDLDIRECT in the last 72 hours. Thyroid Function Tests: No results for input(s): TSH, T4TOTAL, FREET4,  T3FREE, THYROIDAB in the last 72 hours. Anemia Panel: No results for input(s): VITAMINB12, FOLATE, FERRITIN, TIBC, IRON, RETICCTPCT in the last 72 hours.  Sepsis Labs: Recent Labs  Lab 10/05/20 1333 10/05/20 2016  PROCALCITON 0.48  --   LATICACIDVEN  --  1.3    Recent Results (from the past 240 hour(s))  Culture, blood (Routine X 2) w Reflex to ID Panel     Status: None   Collection Time: 10/01/20  9:00 PM   Specimen: BLOOD  Result Value Ref Range Status   Specimen Description   Final    BLOOD BLOOD RIGHT HAND Performed at [facility], [place] [address], [place], [place] [address]    Special Requests   Final    BOTTLES DRAWN AEROBIC AND ANAEROBIC Blood Culture adequate volume Performed at [place] [address] [place], [place] [address]    Culture   Final    NO GROWTH 5 DAYS Performed at [facility], [place] [address], [place], [place] [address]    Report Status 10/07/2020 FINAL  Final  SARS CORONAVIRUS 2 (TAT 6-24 HRS)  Nasopharyngeal Nasopharyngeal Swab     Status: None   Collection Time: 10/05/20  2:52 AM   Specimen: Nasopharyngeal Swab  Result Value Ref Range Status   SARS Coronavirus 2 NEGATIVE NEGATIVE Final    Comment: (NOTE) SARS-CoV-2 target nucleic acids are NOT DETECTED.  The SARS-CoV-2 RNA is generally detectable in upper and lower respiratory specimens during the acute phase of infection. Negative results do not preclude SARS-CoV-2 infection, do not rule out co-infections with other pathogens, and should not be used as the sole basis for treatment or other patient management decisions. Negative results must be combined with clinical observations, patient history, and epidemiological information. The expected result is Negative.  Fact Sheet for Patients: SugarRoll.be  Fact Sheet for Healthcare Providers: https://www.woods-mathews.com/  This test is not yet approved or cleared by the [place] FDA and  has been authorized for detection and/or diagnosis of SARS-CoV-2 by FDA under an Emergency Use Authorization (EUA). This EUA will remain  in effect (meaning this test can be used) for the duration of the COVID-19 declaration under Se ction 564(b)(1) of the Act, 21 U.S.C. section 360bbb-3(b)(1), unless the authorization is terminated or revoked sooner.  Performed at [facility], [place] [address], [place], [place] [address]   Culture, blood (routine x 2)     Status: None   Collection Time: 10/05/20  8:04 PM   Specimen: BLOOD RIGHT HAND  Result Value Ref Range Status   Specimen Description BLOOD RIGHT HAND  Final   Special Requests   Final    BOTTLES DRAWN AEROBIC AND ANAEROBIC Blood Culture results may not be optimal due to an inadequate volume of blood received in culture bottles   Culture   Final    NO GROWTH 5 DAYS Performed at [facility], [place] [address], [place], [place] [address]    Report Status 10/10/2020 FINAL  Final   Culture, blood (routine x 2)     Status: None   Collection Time: 10/05/20  8:05 PM   Specimen: BLOOD LEFT HAND  Result Value Ref Range Status   Specimen Description BLOOD LEFT HAND  Final   Special Requests   Final    BOTTLES DRAWN AEROBIC AND ANAEROBIC Blood Culture results may not be optimal due to an inadequate volume of blood received in culture bottles   Culture  Final    NO GROWTH 5 DAYS Performed at [facility], [place] [address], [place], [place] [address]    Report Status 10/10/2020 FINAL  Final         Radiology Studies: No results found.      Scheduled Meds:  sodium chloride   Intravenous Once   budesonide (PULMICORT) nebulizer solution  0.25 mg Nebulization BID   diclofenac Sodium  2 g Topical TID AC & HS   furosemide  40 mg Oral Daily   guaiFENesin  600 mg Oral BID   insulin aspart  0-20 Units Subcutaneous TID WC   insulin aspart  0-5 Units Subcutaneous QHS   insulin glargine  20 Units Subcutaneous QHS   ipratropium-albuterol  3 mL Nebulization BID   linaclotide  145 mcg Oral QAC breakfast   metoprolol tartrate  50 mg Oral q morning   And   metoprolol tartrate  25 mg Oral QPM   olopatadine  1 drop Both Eyes BID   predniSONE  40 mg Oral Q breakfast   pregabalin  100 mg Oral QHS   sertraline  100 mg Oral QHS   sodium chloride flush  3 mL Intravenous Q12H   traZODone  100 mg Oral QHS   Continuous Infusions:     LOS: 6 days    Time spent: 30mns    JKathie Dike MD [facility]   If 7PM-7AM, please contact night-coverage [email or web address]  10/11/2020, 7:37 PM

## 2020-10-11 NOTE — Progress Notes (Signed)
Inpatient Diabetes Program Recommendations  AACE/ADA: New Consensus Statement on Inpatient Glycemic Control (2015)  Target Ranges:  Prepandial:   less than 140 mg/dL      Peak postprandial:   less than 180 mg/dL (1-2 hours)      Critically ill patients:  140 - 180 mg/dL   Lab Results  Component Value Date   GLUCAP 336 (H) 10/11/2020   HGBA1C 6.0 (A) 08/09/2020    Review of Glycemic Control Results for Kathy Irwin, Kathy Irwin (MRN [id number]) as of 10/11/2020 11:52  Ref. Range 10/10/2020 11:42 10/10/2020 16:10 10/10/2020 21:23 10/11/2020 05:52  Glucose-Capillary Latest Ref Range: 70 - 99 mg/dL 108 (H) 262 (H) 315 (H) 336 (H)  Diabetes history: DM2 Outpatient Diabetes medications: Metformin 1000 mg QAM Current orders for Inpatient glycemic control: Novolog 0-20 units TID & HS, Lantus 20 units QHS; Prednisone 40 mg QD   Inpatient Diabetes Program Recommendations:    While in the setting of steroids, consider adding Novolog 3 units TID (assuming patient is consuming >50% of meals).   Thanks, Bronson Curb, MSN, RNC-OB Diabetes Coordinator [phone] (8a-5p)

## 2020-10-12 ENCOUNTER — Inpatient Hospital Stay: Payer: 59

## 2020-10-12 ENCOUNTER — Ambulatory Visit: Payer: 59

## 2020-10-12 ENCOUNTER — Inpatient Hospital Stay (HOSPITAL_COMMUNITY): Payer: 59

## 2020-10-12 LAB — PATHOLOGIST SMEAR REVIEW

## 2020-10-12 LAB — CBC
HCT: 22.1 % — ABNORMAL LOW (ref 36.0–46.0)
Hemoglobin: 7.3 g/dL — ABNORMAL LOW (ref 12.0–15.0)
MCH: 31.3 pg (ref 26.0–34.0)
MCHC: 33 g/dL (ref 30.0–36.0)
MCV: 94.8 fL (ref 80.0–100.0)
Platelets: 20 10*3/uL — CL (ref 150–400)
RBC: 2.33 MIL/uL — ABNORMAL LOW (ref 3.87–5.11)
RDW: 15.8 % — ABNORMAL HIGH (ref 11.5–15.5)
WBC: 2.1 10*3/uL — ABNORMAL LOW (ref 4.0–10.5)
nRBC: 6.3 % — ABNORMAL HIGH (ref 0.0–0.2)

## 2020-10-12 LAB — GLUCOSE, CAPILLARY
Glucose-Capillary: 154 mg/dL — ABNORMAL HIGH (ref 70–99)
Glucose-Capillary: 123 mg/dL — ABNORMAL HIGH (ref 70–99)

## 2020-10-12 LAB — PREPARE RBC (CROSSMATCH)

## 2020-10-12 MED ORDER — MONTELUKAST SODIUM 10 MG PO TABS
10.0000 mg | ORAL_TABLET | Freq: Every day | ORAL | Status: DC
  Administered 2020-10-12 – 2020-10-27 (×16): 10 mg via ORAL
  Filled 2020-10-12 (×16): qty 1

## 2020-10-12 MED ORDER — ROMIPLOSTIM INJECTION 500 MCG
5.0000 ug/kg | Freq: Once | SUBCUTANEOUS | Status: AC
Start: 2020-10-12 — End: 2020-10-12
  Administered 2020-10-12: 495 ug via SUBCUTANEOUS
  Filled 2020-10-12: qty 0.99

## 2020-10-12 MED ORDER — SODIUM CHLORIDE 0.9% IV SOLUTION: Freq: Once | INTRAVENOUS | Status: AC

## 2020-10-12 NOTE — Progress Notes (Addendum)
PROGRESS NOTE    Kathy Irwin  YQM:[id number] DOB: 1950/08/04 DOA: 10/04/2020 PCP: Kerin Perna, NP    Brief Narrative:  70 year old female with history of CMML, chronic ITP, diastolic heart failure, diabetes, hypertension, presents to the hospital with complaints of shortness of breath.  She was noted to be getting dizzy and breaking out in a sweat when she was standing up.  Hemoglobin noted to be 5.  She was also significantly thrombocytopenic.  CTA chest negative for PE.  She was transfused PRBC and oncology was consulted.   Assessment & Plan:   Active Problems:   Type 2 diabetes mellitus with complication, without long-term current use of insulin (HCC)   HTN (hypertension)   Obesity (BMI 30-39.9)   Chronic diastolic CHF (congestive heart failure) (HCC)   HCAP (healthcare-associated pneumonia)   Hyperbilirubinemia   Idiopathic thrombocytopenia (HCC)   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HCC)   Symptomatic anemia   Symptomatic anemia. -Likely related to CMML and recent chemotherapy -Hemoglobin of 5.1 on admission -She has received a total of 4 units prbc thus far  -follow up Hgb improved to 8.9->7.9->7.3 -will transfuse another unit prbc today -She does not have any evidence of GI bleeding at this time -continue to monitor  SIRS -Possibly related to anemia  -Hemodynamics appear to be stabilizing -Blood cultures with no growth -sepsis ruled out  Acute on chronic respiratory failure with hypoxia -She is chronically on 2 to 3 L of oxygen -CT chest was negative for pulmonary embolus -CT did comment on possible interstitial lung disease versus pneumonia -She has completed a course of antibiotics -currently on prednisone taper -continue bronchodilators to treat any element of pulm fibrosis -Overall respiratory status improving  Thrombocytopenia -May possibly related to chemotherapy, she also has a component of ITP -She received Nplate 6/21, next dose is  today -platelet counts started to trend up, currently 20K -no signs of bleeding -Continue to follow  Pancytopenia -multifactorial related to chemotherapy, CMML and ITP -continue supportive treatments  Coagulopathy -Noted to have elevated INR -She is receiving vitamin K -INR trending down -1.9->1.3  Chronic diastolic congestive heart failure -currently appears to be euvolemic -continue home dose of lasix -continue to monitor volume status  Diabetes mellitus type 2 -Hold home dose of metformin -Continue on SSI -A1c from 4/25 noted to be 6 -blood sugars significantly elevated due to steroids -anticipate this should trend down as steroids are tapered -continue on lantus  AKI -likely related to IV diuretics -treated with limited IV fluids -now resolved  Hypertension -Continue on home dose of metoprolol  Splenomegaly -Noted on CT scan, felt to be related to CMML  Generalized weakness -Seen by physical therapy with recommendations for skilled nursing facility -CSW is working on placement  Right hip pain -Patient has a history of right intertrochanteric/subtrochanteric femur fracture in 04/2020 status post intramedullary nail by Dr. Doreatha Martin -She has had many issues since surgery including infection and prolonged antibiotics -She has had continued pain since her fracture -Since she is 5 months out from her fracture, orthopedics has ordered CT of her hip for further evaluation   DVT prophylaxis: SCDs Start: 10/05/20 0719  Code Status: Full code Family Communication: Discussed with patient Disposition Plan: Status is: Inpatient  Remains inpatient appropriate because:Ongoing diagnostic testing needed not appropriate for outpatient work up, IV treatments appropriate due to intensity of illness or inability to take PO, and Inpatient level of care appropriate due to severity of illness  Dispo: The patient is from: Home  Anticipated d/c is to: SNF               Patient currently is not medically stable to d/c.   Difficult to place patient No     Consultants:  Oncology  Procedures:    Antimicrobials:  Ceftriaxone 6/21 >6/25 Azithromycin 6/21 >6/25   Subjective: Patient says she is feeling better.  She ambulated to the bathroom today without any oxygen.  Feels that her breathing is improving.  Objective: Vitals:   10/11/20 2008 10/11/20 2108 10/12/20 0626 10/12/20 0914  BP:  (!) 118/47 (!) 108/50 (!) 105/51  Pulse: 87 97 73 86  Resp: _0 Temp:  98.7 F (37.1 C) 98.2 F (36.8 C) 98.2 F (36.8 C)  TempSrc:  Oral Axillary   SpO2: 93% 95% 100% 100%  Weight:   99.3 kg   Height:        Intake/Output Summary (Last 24 hours) at 10/12/2020 1001 Last data filed at 10/12/2020 0801 Gross per 24 hour  Intake 957 ml  Output 3550 ml  Net -2593 ml   Filed Weights   10/06/20 2138 10/11/20 0556 10/12/20 0626  Weight: (!) 220.2 kg 98.3 kg 99.3 kg    Examination:  General exam: Alert, awake, oriented x 3 Respiratory system: fine crackles at bases. Respiratory effort normal. Cardiovascular system:RRR. No murmurs, rubs, gallops. Gastrointestinal system: Abdomen is nondistended, soft and nontender. No organomegaly or masses felt. Normal bowel sounds heard. Central nervous system: Alert and oriented. No focal neurological deficits. Extremities: No C/C/E, +pedal pulses Skin: No rashes, lesions or ulcers Psychiatry: Judgement and insight appear normal. Mood & affect appropriate.       Data Reviewed: I have personally reviewed following labs and imaging studies  CBC: Recent Labs  Lab 10/08/20 0322 10/09/20 0159 10/10/20 0224 10/11/20 0036 10/12/20 0534  WBC 4.5 2.9* 2.5* 1.7* 2.1*  HGB 8.8* 8.9* 8.3* 7.9* 7.3*  HCT 26.7* 26.4* 24.9* 24.7* 22.1*  MCV 92.7 93.0 94.7 96.9 94.8  PLT 16* 15* 14* 13* 20*   Basic Metabolic Panel: Recent Labs  Lab 10/06/20 0423 10/08/20 0322 10/09/20 0159 10/10/20 0224 10/11/20 0036   NA 137 134* 131* 137 133*  K 4.2 5.1 5.3* 5.4* 4.6  CL 105 101 102 107 100  CO2 26 26 19* 25 25  GLUCOSE 157* 322* 407* 187* 415*  BUN 17 40* 53* 43* 31*  CREATININE 0.67 1.06* 1.12* 0.83 0.89  CALCIUM 9.5 9.5 9.0 9.8 9.3  PHOS  --   --  5.7* 3.8  --    GFR: Estimated Creatinine Clearance: 67.3 mL/min (by C-G formula based on SCr of 0.89 mg/dL). Liver Function Tests: Recent Labs  Lab 10/06/20 0423 10/09/20 0159 10/10/20 0224  AST 7*  --   --   ALT 8  --   --   ALKPHOS 94  --   --   BILITOT 2.1*  --   --   PROT 5.4*  --   --   ALBUMIN 2.6* 2.4* 2.4*   No results for input(s): LIPASE, AMYLASE in the last 168 hours. No results for input(s): AMMONIA in the last 168 hours. Coagulation Profile: Recent Labs  Lab 10/06/20 0423 10/07/20 0406 10/08/20 0322 10/11/20 0036  INR 1.6* 1.5* 1.3* 1.2   Cardiac Enzymes: No results for input(s): CKTOTAL, CKMB, CKMBINDEX, TROPONINI in the last 168 hours. BNP (last 3 results) No results for input(s): PROBNP in the last 8760 hours. HbA1C: No results for input(s): HGBA1C  in the last 72 hours. CBG: Recent Labs  Lab 10/11/20 0552 10/11/20 1201 10/11/20 1648 10/11/20 2106 10/12/20 0633  GLUCAP 336* 217* 256* 388* 154*   Lipid Profile: No results for input(s): CHOL, HDL, LDLCALC, TRIG, CHOLHDL, LDLDIRECT in the last 72 hours. Thyroid Function Tests: No results for input(s): TSH, T4TOTAL, FREET4, T3FREE, THYROIDAB in the last 72 hours. Anemia Panel: No results for input(s): VITAMINB12, FOLATE, FERRITIN, TIBC, IRON, RETICCTPCT in the last 72 hours.  Sepsis Labs: Recent Labs  Lab 10/05/20 1333 10/05/20 2016  PROCALCITON 0.48  --   LATICACIDVEN  --  1.3    Recent Results (from the past 240 hour(s))  SARS CORONAVIRUS 2 (TAT 6-24 HRS) Nasopharyngeal Nasopharyngeal Swab     Status: None   Collection Time: 10/05/20  2:52 AM   Specimen: Nasopharyngeal Swab  Result Value Ref Range Status   SARS Coronavirus 2 NEGATIVE NEGATIVE  Final    Comment: (NOTE) SARS-CoV-2 target nucleic acids are NOT DETECTED.  The SARS-CoV-2 RNA is generally detectable in upper and lower respiratory specimens during the acute phase of infection. Negative results do not preclude SARS-CoV-2 infection, do not rule out co-infections with other pathogens, and should not be used as the sole basis for treatment or other patient management decisions. Negative results must be combined with clinical observations, patient history, and epidemiological information. The expected result is Negative.  Fact Sheet for Patients: SugarRoll.be  Fact Sheet for Healthcare Providers: https://www.woods-mathews.com/  This test is not yet approved or cleared by the [place] FDA and  has been authorized for detection and/or diagnosis of SARS-CoV-2 by FDA under an Emergency Use Authorization (EUA). This EUA will remain  in effect (meaning this test can be used) for the duration of the COVID-19 declaration under Se ction 564(b)(1) of the Act, 21 U.S.C. section 360bbb-3(b)(1), unless the authorization is terminated or revoked sooner.  Performed at [facility], [place] [address], [place], [place] [address]   Culture, blood (routine x 2)     Status: None   Collection Time: 10/05/20  8:04 PM   Specimen: BLOOD RIGHT HAND  Result Value Ref Range Status   Specimen Description BLOOD RIGHT HAND  Final   Special Requests   Final    BOTTLES DRAWN AEROBIC AND ANAEROBIC Blood Culture results may not be optimal due to an inadequate volume of blood received in culture bottles   Culture   Final    NO GROWTH 5 DAYS Performed at [facility], [place] [address] [place], [place] [address]    Report Status 10/10/2020 FINAL  Final  Culture, blood (routine x 2)     Status: None   Collection Time: 10/05/20  8:05 PM   Specimen: BLOOD LEFT HAND  Result Value Ref Range Status   Specimen Description BLOOD LEFT HAND  Final    Special Requests   Final    BOTTLES DRAWN AEROBIC AND ANAEROBIC Blood Culture results may not be optimal due to an inadequate volume of blood received in culture bottles   Culture   Final    NO GROWTH 5 DAYS Performed at [facility], [place] [address], [place], [place] [address]    Report Status 10/10/2020 FINAL  Final         Radiology Studies: No results found.      Scheduled Meds:  sodium chloride   Intravenous Once   sodium chloride   Intravenous Once   budesonide (PULMICORT) nebulizer solution  0.25 mg  Nebulization BID   diclofenac Sodium  2 g Topical TID AC & HS   furosemide  40 mg Oral Daily   guaiFENesin  600 mg Oral BID   insulin aspart  0-20 Units Subcutaneous TID WC   insulin aspart  0-5 Units Subcutaneous QHS   insulin aspart  3 Units Subcutaneous TID WC   insulin glargine  20 Units Subcutaneous QHS   ipratropium-albuterol  3 mL Nebulization BID   linaclotide  145 mcg Oral QAC breakfast   metoprolol tartrate  50 mg Oral q morning   And   metoprolol tartrate  25 mg Oral QPM   olopatadine  1 drop Both Eyes BID   predniSONE  30 mg Oral Q breakfast   pregabalin  100 mg Oral QHS   romiPLOStim  5 mcg/kg Subcutaneous Once   sertraline  100 mg Oral QHS   sodium chloride flush  3 mL Intravenous Q12H   traZODone  100 mg Oral QHS   Continuous Infusions:     LOS: 7 days    Time spent: 6mns    JKathie Dike MD [facility]   If 7PM-7AM, please contact night-coverage [email or web address]  10/12/2020, 10:01 AM

## 2020-10-12 NOTE — Progress Notes (Addendum)
IP PROGRESS NOTE  Subjective:   She reports improvement in her breathing today.  No chest discomfort reported.  No bleeding reported.  Due to receive 1 unit PRBCs today due to hemoglobin of 7.3.  Still with ongoing right hip discomfort and a CT of the right hip without contrast has been ordered and currently pending.  Objective: Vital signs in last 24 hours: Blood pressure (!) 105/51, pulse 86, temperature 98.2 F (36.8 C), resp. rate 20, height _0  (1.626 m), weight 99.3 kg, SpO2 100 %.  Intake/Output from previous day: 06/27 0701 - 06/28 0700 In: 717 [P.O.:717] Out: 3550 [Urine:3550]  Physical Exam:  HEENT: No thrush or bleeding Lungs: Clear bilaterally Cardiac: Regular rate and rhythm, 2/6 systolic murmur Abdomen: Nontender, no hepatosplenomegaly Extremities: No leg edema   Lab Results: Recent Labs    10/11/20 0036 10/12/20 0534  WBC 1.7* 2.1*  HGB 7.9* 7.3*  HCT 24.7* 22.1*  PLT 13* 20*    BMET Recent Labs    10/10/20 0224 10/11/20 0036  NA 137 133*  K 5.4* 4.6  CL 107 100  CO2 25 25  GLUCOSE 187* 415*  BUN 43* 31*  CREATININE 0.83 0.89  CALCIUM 9.8 9.3    No results found for: CEA1  Studies/Results: No results found.  Medications: I have reviewed the patient's current medications.  Assessment/Plan: 1. 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  Anemia 3.  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  SIRS 5.  Chronic respiratory failure with hypoxia and interstitial lung disease,?  Healthcare associated pneumonia 6.  Coagulopathy 7.  Diastolic CHF 8.  Hypertension 9.  Diabetes mellitus 10.  Anxiety 11.  Hyperbilirubinemia 12.  Splenomegaly secondary to #1   Kathy Irwin appears unchanged.  Hemoglobin today is 7.3.  Hospitalist has ordered 1 unit PRBCs today to be transfused.  Platelets remain stable today.  She is not actively bleeding.  She is due for her next dose of Nplate today which has been ordered.     CT of the right hip has been ordered per Ortho.  Recommendations: Agree with transfusion of 1 unit packed red blood cells Administer Nplate today. Physical therapy, increase ambulation as tolerated Follow-up as previously scheduled at the cancer center on 10/19/2020   LOS: 7 days   Kathy Irwin, Kathy Irwin   10/12/2020, 2:14 PM Kathy Irwin was interviewed and examined.  She appears unchanged.  She has persistent pancytopenia.  She will receive 1 unit of packed red blood cells.  She will continue weekly Nplate.  She has a follow-up appointment with Dr. Irene Limbo on 10/19/2020.  I was present for greater than 50% of today's visit.  I performed medical decision making.  Kathy Manson, MD

## 2020-10-12 NOTE — Plan of Care (Signed)

## 2020-10-12 NOTE — Progress Notes (Signed)
OT Cancellation Note  Patient Details Name: Kathy Irwin MRN: [id number] DOB: [birth year]   Cancelled Treatment:    Reason Eval/Treat Not Completed: Pain limiting ability to participate: Pt reports that she was premedicated for pain ~1 hour prior to OT arrival but still rates as 7/10 and refusing therapy "for today". Will continue efforts.   Julien Girt 10/12/2020, 10:25 AM

## 2020-10-13 ENCOUNTER — Ambulatory Visit: Payer: 59

## 2020-10-13 LAB — GLUCOSE, CAPILLARY
Glucose-Capillary: 289 mg/dL — ABNORMAL HIGH (ref 70–99)
Glucose-Capillary: 317 mg/dL — ABNORMAL HIGH (ref 70–99)
Glucose-Capillary: 165 mg/dL — ABNORMAL HIGH (ref 70–99)
Glucose-Capillary: 175 mg/dL — ABNORMAL HIGH (ref 70–99)
Glucose-Capillary: 349 mg/dL — ABNORMAL HIGH (ref 70–99)
Glucose-Capillary: 323 mg/dL — ABNORMAL HIGH (ref 70–99)

## 2020-10-13 LAB — CBC
HCT: 23.9 % — ABNORMAL LOW (ref 36.0–46.0)
Hemoglobin: 7.8 g/dL — ABNORMAL LOW (ref 12.0–15.0)
MCH: 31.1 pg (ref 26.0–34.0)
MCHC: 32.6 g/dL (ref 30.0–36.0)
MCV: 95.2 fL (ref 80.0–100.0)
Platelets: 21 10*3/uL — CL (ref 150–400)
RBC: 2.51 MIL/uL — ABNORMAL LOW (ref 3.87–5.11)
RDW: 15.6 % — ABNORMAL HIGH (ref 11.5–15.5)
WBC: 2 10*3/uL — ABNORMAL LOW (ref 4.0–10.5)
nRBC: 7.6 % — ABNORMAL HIGH (ref 0.0–0.2)

## 2020-10-13 LAB — BASIC METABOLIC PANEL
Anion gap: 3 — ABNORMAL LOW (ref 5–15)
BUN: 14 mg/dL (ref 8–23)
CO2: 28 mmol/L (ref 22–32)
Calcium: 9.2 mg/dL (ref 8.9–10.3)
Chloride: 105 mmol/L (ref 98–111)
Creatinine, Ser: 0.52 mg/dL (ref 0.44–1.00)
GFR, Estimated: >60 mL/min (ref >60)
Glucose, Bld: 222 mg/dL — ABNORMAL HIGH (ref 70–99)
Potassium: 4 mmol/L (ref 3.5–5.1)
Sodium: 136 mmol/L (ref 135–145)

## 2020-10-13 LAB — DIFFERENTIAL
Abs Immature Granulocytes: 0.03 10*3/uL (ref 0.00–0.07)
Basophils Absolute: 0 10*3/uL (ref 0.0–0.1)
Basophils Relative: 0 %
Eosinophils Absolute: 0 10*3/uL (ref 0.0–0.5)
Eosinophils Relative: 1 %
Immature Granulocytes: 2 %
Lymphocytes Relative: 65 %
Lymphs Abs: 1.3 10*3/uL (ref 0.7–4.0)
Monocytes Absolute: 0.1 10*3/uL (ref 0.1–1.0)
Monocytes Relative: 5 %
Neutro Abs: 0.6 10*3/uL — ABNORMAL LOW (ref 1.7–7.7)
Neutrophils Relative %: 27 %
Smear Review: NORMAL

## 2020-10-13 NOTE — Progress Notes (Signed)
PROGRESS NOTE    Kathy Irwin  IRS:[id number] DOB: 1950/07/12 DOA: 10/04/2020 PCP: Kerin Perna, NP    Brief Narrative:  70 year old female with history of CMML, chronic ITP, diastolic heart failure, diabetes, hypertension, presents to the hospital with complaints of shortness of breath.  She was noted to be getting dizzy and breaking out in a sweat when she was standing up.  Hemoglobin noted to be 5.  She was also significantly thrombocytopenic.  CTA chest negative for PE.  She was transfused PRBC and oncology was consulted.  Assessment & Plan:   Active Problems:   Type 2 diabetes mellitus with complication, without long-term current use of insulin (HCC)   HTN (hypertension)   Obesity (BMI 30-39.9)   Chronic diastolic CHF (congestive heart failure) (HCC)   HCAP (healthcare-associated pneumonia)   Hyperbilirubinemia   Idiopathic thrombocytopenia (HCC)   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HCC)   Symptomatic anemia   Symptomatic anemia/pancytopenia, POA. -Likely related to CMML and recent chemotherapy -Hemoglobin of 5.1 on admission -She has received a total of 4 units prbc thus far     Component Value Date/Time   HGB 7.8 (L) 10/13/2020 0523   HGB 6.0 (LL) 09/28/2020 1326   HGB 11.2 12/30/2019 1159   HCT 23.9 (L) 10/13/2020 0523   HCT 20.0 (L) 10/06/2020 0423  -She does not have any sources of bleeding at this time -continue to monitor  SIRS without source -Possibly related to anemia  -Hemodynamics appear to be stabilizing -Blood cultures with no growth -sepsis ruled out at this time  Acute on chronic respiratory failure with hypoxia -She is chronically on 2 to 3 L of oxygen -CT chest was negative for pulmonary embolus -CT did comment on possible interstitial lung disease versus pneumonia -She has completed a course of antibiotics -Currently on prednisone taper -Continue bronchodilators to treat any element of pulm fibrosis -Overall respiratory status  improving  Thrombocytopenia -May possibly related to chemotherapy, she also has a component of ITP -She received Nplate (Romiplostim) 0/09, repeated 6/28 -platelet counts started to trend up slowly, currently 21K  Coagulopathy -Noted to have elevated INR - previously received vitamin K -INR trending down appropriately Lab Results  Component Value Date   INR 1.2 10/11/2020   INR 1.3 (H) 10/08/2020   INR 1.5 (H) 10/07/2020   Chronic diastolic congestive heart failure -currently appears to be euvolemic -continue home dose of lasix -continue to monitor volume status  Diabetes mellitus type 2, well controlled -Hold home dose of metformin -Continue on SSI -A1c from 4/25 noted to be 6 -blood sugars significantly elevated due to steroids -anticipate this should trend down as steroids are tapered -continue on lantus  AKI -likely related to IV diuretics -treated with limited IV fluids -now resolved  Hypertension -Continue on home dose of metoprolol  Splenomegaly -Noted on CT scan, felt to be related to CMML  Generalized weakness -Seen by physical therapy with recommendations for skilled nursing facility -CSW is working on placement  Right hip pain -Patient has a history of right intertrochanteric/subtrochanteric femur fracture in 04/2020 status post intramedullary nail by Dr. Doreatha Martin -She has had many issues since surgery including infection and prolonged antibiotics -She has had continued pain since her fracture -Since she is 5 months out from her fracture, orthopedics has ordered CT of her hip for further evaluation -appreciate their insight and recommendations  DVT prophylaxis: SCDs Start: 10/05/20 0719 given coagulopathy as above  Code Status: Full code Family Communication: Discussed with patient Disposition Plan:  Status is: Inpatient  Remains inpatient appropriate because:Ongoing diagnostic testing needed not appropriate for outpatient work up, IV treatments  appropriate due to intensity of illness or inability to take PO, and Inpatient level of care appropriate due to severity of illness  Dispo: The patient is from: Home              Anticipated d/c is to: SNF              Patient currently is not medically stable to d/c.   Difficult to place patient No  Consultants:  Oncology, orthopedic surgery  Antimicrobials:  Ceftriaxone 6/21 >6/25 Azithromycin 6/21 >6/25   Subjective: No acute issues or events overnight, patient denies nausea vomiting diarrhea constipation headache fevers or chills.  Objective: Vitals:   10/13/20 0430 10/13/20 0618 10/13/20 0844 10/13/20 0845  BP: (!) 112/58     Pulse: 84     Resp: 18     Temp: 97.6 F (36.4 C)     TempSrc: Oral     SpO2: 100%  100% 100%  Weight:  103 kg    Height:        Intake/Output Summary (Last 24 hours) at 10/13/2020 0907 Last data filed at 10/13/2020 0651 Gross per 24 hour  Intake 1071 ml  Output 1350 ml  Net -279 ml    Filed Weights   10/12/20 0626 10/12/20 2100 10/13/20 0618  Weight: 99.3 kg 103.4 kg 103 kg    Examination:  General exam: Alert, awake, oriented x 3 Respiratory system: fine crackles at bases. Respiratory effort normal. Cardiovascular system:RRR. No murmurs, rubs, gallops. Gastrointestinal system: Abdomen is nondistended, soft and nontender. No organomegaly or masses felt. Normal bowel sounds heard. Central nervous system: Alert and oriented. No focal neurological deficits. Extremities: No C/C/E, +pedal pulses Skin: No rashes, lesions or ulcers  Data Reviewed: I have personally reviewed following labs and imaging studies  CBC: Recent Labs  Lab 10/09/20 0159 10/10/20 0224 10/11/20 0036 10/12/20 0534 10/13/20 0523  WBC 2.9* 2.5* 1.7* 2.1* 2.0*  HGB 8.9* 8.3* 7.9* 7.3* 7.8*  HCT 26.4* 24.9* 24.7* 22.1* 23.9*  MCV 93.0 94.7 96.9 94.8 95.2  PLT 15* 14* 13* 20* 21*    Basic Metabolic Panel: Recent Labs  Lab 10/08/20 0322 10/09/20 0159  10/10/20 0224 10/11/20 0036 10/13/20 0523  NA 134* 131* 137 133* 136  K 5.1 5.3* 5.4* 4.6 4.0  CL 101 102 107 100 105  CO2 26 19* _0 GLUCOSE 322* 407* 187* 415* 222*  BUN 40* 53* 43* 31* 14  CREATININE 1.06* 1.12* 0.83 0.89 0.52  CALCIUM 9.5 9.0 9.8 9.3 9.2  PHOS  --  5.7* 3.8  --   --     GFR: Estimated Creatinine Clearance: 76.4 mL/min (by C-G formula based on SCr of 0.52 mg/dL). Liver Function Tests: Recent Labs  Lab 10/09/20 0159 10/10/20 0224  ALBUMIN 2.4* 2.4*    No results for input(s): LIPASE, AMYLASE in the last 168 hours. No results for input(s): AMMONIA in the last 168 hours. Coagulation Profile: Recent Labs  Lab 10/07/20 0406 10/08/20 0322 10/11/20 0036  INR 1.5* 1.3* 1.2    Cardiac Enzymes: No results for input(s): CKTOTAL, CKMB, CKMBINDEX, TROPONINI in the last 168 hours. BNP (last 3 results) No results for input(s): PROBNP in the last 8760 hours. HbA1C: No results for input(s): HGBA1C in the last 72 hours. CBG: Recent Labs  Lab 10/12/20 [id number] 10/12/20 1122 10/12/20 1710 10/12/20 2117 10/13/20 8921  GLUCAP 154* 123* 289* 317* 165*    Lipid Profile: No results for input(s): CHOL, HDL, LDLCALC, TRIG, CHOLHDL, LDLDIRECT in the last 72 hours. Thyroid Function Tests: No results for input(s): TSH, T4TOTAL, FREET4, T3FREE, THYROIDAB in the last 72 hours. Anemia Panel: No results for input(s): VITAMINB12, FOLATE, FERRITIN, TIBC, IRON, RETICCTPCT in the last 72 hours.  Sepsis Labs: No results for input(s): PROCALCITON, LATICACIDVEN in the last 168 hours.   Recent Results (from the past 240 hour(s))  SARS CORONAVIRUS 2 (TAT 6-24 HRS) Nasopharyngeal Nasopharyngeal Swab     Status: None   Collection Time: 10/05/20  2:52 AM   Specimen: Nasopharyngeal Swab  Result Value Ref Range Status   SARS Coronavirus 2 NEGATIVE NEGATIVE Final    Comment: (NOTE) SARS-CoV-2 target nucleic acids are NOT DETECTED.  The SARS-CoV-2 RNA is generally  detectable in upper and lower respiratory specimens during the acute phase of infection. Negative results do not preclude SARS-CoV-2 infection, do not rule out co-infections with other pathogens, and should not be used as the sole basis for treatment or other patient management decisions. Negative results must be combined with clinical observations, patient history, and epidemiological information. The expected result is Negative.  Fact Sheet for Patients: SugarRoll.be  Fact Sheet for Healthcare Providers: https://www.woods-mathews.com/  This test is not yet approved or cleared by the [place] FDA and  has been authorized for detection and/or diagnosis of SARS-CoV-2 by FDA under an Emergency Use Authorization (EUA). This EUA will remain  in effect (meaning this test can be used) for the duration of the COVID-19 declaration under Se ction 564(b)(1) of the Act, 21 U.S.C. section 360bbb-3(b)(1), unless the authorization is terminated or revoked sooner.  Performed at [facility], [place] [address] [place], [place] [address]   Culture, blood (routine x 2)     Status: None   Collection Time: 10/05/20  8:04 PM   Specimen: BLOOD RIGHT HAND  Result Value Ref Range Status   Specimen Description BLOOD RIGHT HAND  Final   Special Requests   Final    BOTTLES DRAWN AEROBIC AND ANAEROBIC Blood Culture results may not be optimal due to an inadequate volume of blood received in culture bottles   Culture   Final    NO GROWTH 5 DAYS Performed at [facility], [place] [address], [place], [place] [address]    Report Status 10/10/2020 FINAL  Final  Culture, blood (routine x 2)     Status: None   Collection Time: 10/05/20  8:05 PM   Specimen: BLOOD LEFT HAND  Result Value Ref Range Status   Specimen Description BLOOD LEFT HAND  Final   Special Requests   Final    BOTTLES DRAWN AEROBIC AND ANAEROBIC Blood Culture results may not be optimal due  to an inadequate volume of blood received in culture bottles   Culture   Final    NO GROWTH 5 DAYS Performed at [facility], [place] [address], [place],  [address]    Report Status 10/10/2020 FINAL  Final          Radiology Studies: CT HIP RIGHT WO CONTRAST  Result Date: 10/12/2020 CLINICAL DATA:  Hip fracture EXAM: CT OF THE RIGHT HIP WITHOUT CONTRAST TECHNIQUE: Multidetector CT imaging of the right hip was performed according to the standard protocol. Multiplanar CT image reconstructions were also generated. COMPARISON:  CT 07/07/2020 FINDINGS: Bones/Joint/Cartilage Similar appearance of the ununited, comminuted right inter trochanteric fracture transfixed with an  intramedullary nail and interlocking femoral neck screws. There is callus formation without significant bony bridging. Persistent low-attenuation material along the lateral margin of the fracture (axial images 58-65). There is a soft tissue tract extending towards the skin surface, and skin thickening of the lateral thigh. There is no new focal fluid collection. Hardware is intact without evidence of loosening or new fracture. 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 Severe right hip osteoarthritis. There is no hip effusion. Ligaments Suboptimally assessed by CT. Muscles and Tendons No muscle atrophy. Soft tissues No well-defined/rim enhancing fluid collection. IMPRESSION: Similar appearance of the ununited, comminuted right intertrochanteric fracture with intramedullary nail and and screw fixation. Intact hardware without evidence of loosening. Persistent soft tissue tract extending towards the skin surface with underlying low attenuation material along the lateral margin of the fracture, possibly residual blood products, however infectious process cannot be completely excluded. There is no well-defined/drainable fluid collection. If there is concern for soft tissue infection extending from the  fracture site, MRI of the right hip utilizing metal artifact reduction sequences (MARS protocol) may be helpful for further evaluation. Electronically Signed   By: Maurine Simmering   On: 10/12/2020 17:40        Scheduled Meds:  sodium chloride   Intravenous Once   sodium chloride   Intravenous Once   budesonide (PULMICORT) nebulizer solution  0.25 mg Nebulization BID   diclofenac Sodium  2 g Topical TID AC & HS   furosemide  40 mg Oral Daily   guaiFENesin  600 mg Oral BID   insulin aspart  0-20 Units Subcutaneous TID WC   insulin aspart  0-5 Units Subcutaneous QHS   insulin aspart  3 Units Subcutaneous TID WC   insulin glargine  20 Units Subcutaneous QHS   ipratropium-albuterol  3 mL Nebulization BID   linaclotide  145 mcg Oral QAC breakfast   metoprolol tartrate  50 mg Oral q morning   And   metoprolol tartrate  25 mg Oral QPM   montelukast  10 mg Oral QHS   olopatadine  1 drop Both Eyes BID   predniSONE  30 mg Oral Q breakfast   pregabalin  100 mg Oral QHS   sertraline  100 mg Oral QHS   sodium chloride flush  3 mL Intravenous Q12H   traZODone  100 mg Oral QHS   Continuous Infusions:     LOS: 8 days    Time spent: 65mns    Donell Sliwinski C Moncia Annas, DO [facility]   If 7PM-7AM, please contact night-coverage [email or web address]  10/13/2020, 9:07 AM

## 2020-10-13 NOTE — Progress Notes (Signed)
Occupational Therapy Treatment Patient Details Name: Kathy Irwin MRN: [id number] DOB: 1950-05-10 Today's Date: 10/13/2020    History of present illness 70 y.o. female presents to ED 10/04/20 with continued complaints of SoB and chest tightness. Presented to [facility] with similar complaints 6/17 In ED  HGB 5.1 and received 2 units of PRBC Chest x-ray noted improving bibasilar infiltrates compared to x-ray from 6/17 CT of the chest performed which was negative for PE but did show increase in her splenomegaly. 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  She last had received infusion with romiplostim on 6/14.   OT comments  Pt progressing to performing light bathing and ADL at sink with minA overall. Pt very limited by dyspnea and decreased acitivity tolerance.  Pt's ADL with MinguardA to minA  and minA overall for stability to with RW; pt mostly fatigues very easily with minimal exertion and requires multiple seate rest breaks throughout ADL. O2 sats hard to read at times on 3L O2 >90%. Pt would greatly benefit from continued OT skilled services. OT following acutely. **Pt appears close to her baseline and would benefit from additional therapy. HHOT is an option if chooses to go home as pt is not always motivated to move, but realizes it takes time to      Follow Up Recommendations  SNF    Equipment Recommendations  None recommended by OT    Recommendations for Other Services      Precautions / Restrictions Precautions Precautions: Fall Precaution Comments: watch O2 Restrictions Weight Bearing Restrictions: No       Mobility Bed Mobility Overal bed mobility: Needs Assistance Bed Mobility: Supine to Sit     Supine to sit: Min assist     General bed mobility comments: MinA for trunk elevation    Transfers Overall transfer level:  Needs assistance Equipment used: Rolling walker (2 wheeled) Transfers: Sit to/from [facility] Sit to Stand: Min guard Stand pivot transfers: Min guard       General transfer comment: 3L O2 85-93% O2 throughout session;    Balance Overall balance assessment: Needs assistance Sitting-balance support: Feet supported;No upper extremity supported Sitting balance-Leahy Scale: Good     Standing balance support: Single extremity supported;During functional activity;Bilateral upper extremity supported Standing balance-Leahy Scale: Fair Standing balance comment: requires UE support for dynamic balance during pericare                           ADL either performed or assessed with clinical judgement   ADL Overall ADL's : Needs assistance/impaired Eating/Feeding: Modified independent                       Toilet Transfer: Min guard;Ambulation;BSC;RW Toilet Transfer Details (indicate cue type and reason): assist to avoid plopping Toileting- Clothing Manipulation and Hygiene: Moderate assistance;Sit to/from stand Toileting - Clothing Manipulation Details (indicate cue type and reason): can perform anterior pericare in sitting and standing, but requires assist in standing     Functional mobility during ADLs: Min guard (Pt's ADL with MinguardA overall for stability to with RW; pt mostly fatigues very easily with minimal exertion and requires multiple seate rest breaks throughout ADL. O2 sats hard to read at times on 3L O2 >90%.) General ADL Comments: Pt very limited by dyspnea and decreased acitivity  tolerance.     Vision   Vision Assessment?: No apparent visual deficits   Perception     Praxis      Cognition Arousal/Alertness: Awake/alert Behavior During Therapy: WFL for tasks assessed/performed;Flat affect Overall Cognitive Status: Within Functional Limits for tasks assessed                                          Exercises      Shoulder Instructions       General Comments      Pertinent Vitals/ Pain       Pain Assessment: Faces Faces Pain Scale: Hurts a little bit Pain Location: R hip radiating to calf Pain Descriptors / Indicators: Discomfort;Sore;Aching Pain Intervention(s): Monitored during session  Home Living                                          Prior Functioning/Environment              Frequency  Min 2X/week        Progress Toward Goals  OT Goals(current goals can now be found in the care plan section)  Progress towards OT goals: Progressing toward goals  Acute Rehab OT Goals Patient Stated Goal: be able to walk again OT Goal Formulation: With patient Time For Goal Achievement: 10/20/20 Potential to Achieve Goals: Good  Plan Discharge plan remains appropriate    Co-evaluation                 AM-PAC OT "6 Clicks" Daily Activity     Outcome Measure   Help from another person eating meals?: None Help from another person taking care of personal grooming?: A Little Help from another person toileting, which includes using toliet, bedpan, or urinal?: A Little Help from another person bathing (including washing, rinsing, drying)?: A Lot Help from another person to put on and taking off regular upper body clothing?: A Little Help from another person to put on and taking off regular lower body clothing?: A Lot 6 Click Score: 17    End of Session Equipment Utilized During Treatment: Gait belt;Rolling walker;Oxygen  OT Visit Diagnosis: Unsteadiness on feet (R26.81);Muscle weakness (generalized) (M62.81)   Activity Tolerance Patient limited by fatigue   Patient Left in chair;with call bell/phone within reach;with chair alarm set   Nurse Communication Mobility status        Time: 0930-1030 OT Time Calculation (min): 60 min  Charges: OT General Charges $OT Visit: 1 Visit OT Treatments $Self Care/Home Management : 38-52 mins $Therapeutic  Activity: 8-22 mins  Jefferey Pica, OTR/L Acute Rehabilitation Services Pager: [phone] Office: [phone]    Eldana Isip C 10/13/2020, 8:57 PM

## 2020-10-13 NOTE — Progress Notes (Signed)
Physical Therapy Treatment Patient Details Name: Kathy Irwin MRN: [id number] DOB: 27-Sep-1950 Today's Date: 10/13/2020    History of Present Illness 70 y.o. female presents to ED 10/04/20 with continued complaints of SoB and chest tightness. Presented to [facility] with similar complaints 6/17 In ED  HGB 5.1 and received 2 units of PRBC Chest x-ray noted improving bibasilar infiltrates compared to x-ray from 6/17 CT of the chest performed which was negative for PE but did show increase in her splenomegaly. 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  She last had received infusion with romiplostim on 6/14.    PT Comments    Pt sitting up in recliner on 3L O2 via [place], agreeable to working with therapy. Pt continues to be limited in safe mobility by DoE, R hip pain, and generalized weakness. Pt is min A for bed mobility, min guard for transfers and 2 bouts of ambulation with RW. D/c plans remain appropriate, however SNF placement is currently proving problematic so PT frequency has been increased in case she needs to go home.    Follow Up Recommendations  SNF     Equipment Recommendations  None recommended by PT (has necessary equipment)       Precautions / Restrictions Precautions Precautions: Fall Restrictions Weight Bearing Restrictions: No    Mobility  Bed Mobility Overal bed mobility: Needs Assistance Bed Mobility: Supine to Sit;Sit to Supine       Sit to supine: Min assist   General bed mobility comments: min A for managing LE back into bed    Transfers Overall transfer level: Needs assistance   Transfers: Sit to/from Stand;Stand Pivot Transfers Sit to Stand: Min guard         General transfer comment: min guard for safety, good hand placement for power up  Ambulation/Gait Ambulation/Gait assistance: Min guard Gait Distance  (Feet): 24 Feet (2x 24 feet) Assistive device: Rolling walker (2 wheeled) Gait Pattern/deviations: Step-through pattern;Decreased weight shift to right;Decreased stance time - right;Trendelenburg;Trunk flexed Gait velocity: slowed Gait velocity interpretation: <1.31 ft/sec, indicative of household ambulator General Gait Details: min guard for safety, improved weightbearin on R LE         Balance Overall balance assessment: Needs assistance Sitting-balance support: Feet supported;No upper extremity supported Sitting balance-Leahy Scale: Good     Standing balance support: No upper extremity supported;Bilateral upper extremity supported;Single extremity supported Standing balance-Leahy Scale: Fair Standing balance comment: requires UE support for dynamic balance                            Cognition Arousal/Alertness: Awake/alert Behavior During Therapy: WFL for tasks assessed/performed;Flat affect Overall Cognitive Status: Within Functional Limits for tasks assessed                                           General Comments General comments (skin integrity, edema, etc.): Pt on 3L O2 via [place] for ambulation, requires maximal vc for purse lip breathing to keep SaO2 >90%O2 with ambulation. Pt with 3/4 DoE with ambulation      Pertinent Vitals/Pain Pain Assessment: Faces Faces Pain Scale: Hurts a little bit Pain Location: R hip radiating to calf Pain Descriptors / Indicators: Discomfort;Sore;Aching Pain Intervention(s):  Limited activity within patient's tolerance;Monitored during session;Repositioned     PT Goals (current goals can now be found in the care plan section) Acute Rehab PT Goals Patient Stated Goal: be able to walk again PT Goal Formulation: With patient/family Time For Goal Achievement: 10/20/20 Progress towards PT goals: Progressing toward goals    Frequency    Min 3X/week      PT Plan Current plan remains appropriate;Frequency  needs to be updated       AM-PAC PT "6 Clicks" Mobility   Outcome Measure  Help needed turning from your back to your side while in a flat bed without using bedrails?: None Help needed moving from lying on your back to sitting on the side of a flat bed without using bedrails?: None Help needed moving to and from a bed to a chair (including a wheelchair)?: A Little Help needed standing up from a chair using your arms (e.g., wheelchair or bedside chair)?: A Little Help needed to walk in hospital room?: A Little Help needed climbing 3-5 steps with a railing? : Total 6 Click Score: 18    End of Session Equipment Utilized During Treatment: Gait belt;Oxygen Activity Tolerance: Patient tolerated treatment well Patient left: with call bell/phone within reach;in bed;with bed alarm set Nurse Communication: Mobility status PT Visit Diagnosis: Other abnormalities of gait and mobility (R26.89);Muscle weakness (generalized) (M62.81);Difficulty in walking, not elsewhere classified (R26.2);Pain Pain - Right/Left: Right Pain - part of body: Hip     Time: 1065-3990 PT Time Calculation (min) (ACUTE ONLY): 22 min  Charges:  $Therapeutic Exercise: 8-22 mins                     Jeffry Vogelsang B. Migdalia Dk PT, DPT Acute Rehabilitation Services Pager [phone] Office [phone]    [place] 10/13/2020, 1:41 PM

## 2020-10-13 NOTE — Plan of Care (Signed)
  Problem: Clinical Measurements: Goal: Respiratory complications will improve Outcome: Progressing   Problem: Activity: Goal: Risk for activity intolerance will decrease Outcome: Progressing

## 2020-10-13 NOTE — Plan of Care (Signed)

## 2020-10-14 ENCOUNTER — Ambulatory Visit: Payer: 59

## 2020-10-14 DIAGNOSIS — D649 Anemia, unspecified: Secondary | ICD-10-CM | POA: Diagnosis not present

## 2020-10-14 DIAGNOSIS — I5032 Chronic diastolic (congestive) heart failure: Secondary | ICD-10-CM | POA: Diagnosis not present

## 2020-10-14 DIAGNOSIS — J189 Pneumonia, unspecified organism: Secondary | ICD-10-CM | POA: Diagnosis not present

## 2020-10-14 DIAGNOSIS — C9310 Chronic myelomonocytic leukemia not having achieved remission: Secondary | ICD-10-CM | POA: Diagnosis not present

## 2020-10-14 LAB — CBC
HCT: 21.3 % — ABNORMAL LOW (ref 36.0–46.0)
Hemoglobin: 7 g/dL — ABNORMAL LOW (ref 12.0–15.0)
MCH: 30.8 pg (ref 26.0–34.0)
MCHC: 32.9 g/dL (ref 30.0–36.0)
MCV: 93.8 fL (ref 80.0–100.0)
Platelets: 20 10*3/uL — CL (ref 150–400)
RBC: 2.27 MIL/uL — ABNORMAL LOW (ref 3.87–5.11)
RDW: 15.5 % (ref 11.5–15.5)
WBC: 2.2 10*3/uL — ABNORMAL LOW (ref 4.0–10.5)
nRBC: 9.7 % — ABNORMAL HIGH (ref 0.0–0.2)

## 2020-10-14 LAB — HEMOGLOBIN AND HEMATOCRIT, BLOOD
HCT: 27.9 % — ABNORMAL LOW (ref 36.0–46.0)
Hemoglobin: 9.2 g/dL — ABNORMAL LOW (ref 12.0–15.0)

## 2020-10-14 LAB — BASIC METABOLIC PANEL
Anion gap: 4 — ABNORMAL LOW (ref 5–15)
BUN: 21 mg/dL (ref 8–23)
CO2: 26 mmol/L (ref 22–32)
Calcium: 9.2 mg/dL (ref 8.9–10.3)
Chloride: 106 mmol/L (ref 98–111)
Creatinine, Ser: 0.49 mg/dL (ref 0.44–1.00)
GFR, Estimated: >60 mL/min (ref >60)
Glucose, Bld: 105 mg/dL — ABNORMAL HIGH (ref 70–99)
Potassium: 4.3 mmol/L (ref 3.5–5.1)
Sodium: 136 mmol/L (ref 135–145)

## 2020-10-14 LAB — GLUCOSE, CAPILLARY
Glucose-Capillary: 130 mg/dL — ABNORMAL HIGH (ref 70–99)
Glucose-Capillary: 136 mg/dL — ABNORMAL HIGH (ref 70–99)
Glucose-Capillary: 105 mg/dL — ABNORMAL HIGH (ref 70–99)
Glucose-Capillary: 260 mg/dL — ABNORMAL HIGH (ref 70–99)

## 2020-10-14 LAB — PREPARE RBC (CROSSMATCH)

## 2020-10-14 MED ORDER — SODIUM CHLORIDE 0.9% IV SOLUTION
Freq: Once | INTRAVENOUS | Status: AC
Start: 2020-10-14 — End: 2020-10-14

## 2020-10-14 MED ORDER — DIPHENHYDRAMINE-ZINC ACETATE 2-0.1 % EX CREA
TOPICAL_CREAM | Freq: Three times a day (TID) | CUTANEOUS | Status: DC | PRN
  Filled 2020-10-14 (×2): qty 28

## 2020-10-14 NOTE — Plan of Care (Signed)
  Problem: Education: Goal: Knowledge of General Education information will improve Description: Including pain rating scale, medication(s)/side effects and non-pharmacologic comfort measures Outcome: Progressing   Problem: Health Behavior/Discharge Planning: Goal: Ability to manage health-related needs will improve Outcome: Progressing   Problem: Clinical Measurements: Goal: Ability to maintain clinical measurements within normal limits will improve Outcome: Progressing   Problem: Nutrition: Goal: Adequate nutrition will be maintained Outcome: Progressing   Problem: Pain Managment: Goal: General experience of comfort will improve Outcome: Progressing   Problem: Skin Integrity: Goal: Risk for impaired skin integrity will decrease Outcome: Progressing

## 2020-10-14 NOTE — Progress Notes (Signed)
PROGRESS NOTE    Kathy Irwin  IWP:[id number] DOB: Mar 18, 1951 DOA: 10/04/2020 PCP: Kerin Perna, NP    Brief Narrative:  70 year old female with history of CMML, chronic ITP, diastolic heart failure, diabetes, hypertension, presents to the hospital with complaints of shortness of breath.  She was noted to be getting dizzy and breaking out in a sweat when she was standing up.  Hemoglobin noted to be 5.  She was also significantly thrombocytopenic.  CTA chest negative for PE.  She was transfused PRBC and oncology was consulted.  Assessment & Plan:   Active Problems:   Type 2 diabetes mellitus with complication, without long-term current use of insulin (HCC)   HTN (hypertension)   Obesity (BMI 30-39.9)   Chronic diastolic CHF (congestive heart failure) (HCC)   HCAP (healthcare-associated pneumonia)   Hyperbilirubinemia   Idiopathic thrombocytopenia (HCC)   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HCC)   Symptomatic anemia   Symptomatic anemia/pancytopenia, POA CMML -Likely related to chronic myelomonocytic leukemia and recent chemotherapy - defer to Oncology -Hemoglobin of 5.1 on admission -  -She has received a total of 5 units prbc thus far (additional unit given today for a total of 5)    Component Value Date/Time   HGB 7.0 (L) 10/14/2020 0304   HGB 6.0 (LL) 09/28/2020 1326   HGB 11.2 12/30/2019 1159   HCT 21.3 (L) 10/14/2020 0304   HCT 20.0 (L) 10/06/2020 0423  -She does not have any sources of bleeding at this time -likely secondary to below -continue to monitor  SIRS without source -Possibly related to anemia  -Hemodynamics appear to be stabilizing -Blood cultures with no growth -sepsis ruled out at this time  Acute on chronic respiratory failure with hypoxia -She is chronically on 2 to 3 L of oxygen -CT chest was negative for pulmonary embolus -CT did comment on possible interstitial lung disease versus pneumonia -She has completed a course of  antibiotics -Currently on prednisone taper -Continue bronchodilators to treat any element of pulm fibrosis -Overall respiratory status improving  Thrombocytopenia -May possibly related to chemotherapy, she also has a component of ITP -She received Nplate (Romiplostim) 5/05, repeated 6/28 -Stable at 20k  Coagulopathy -Noted to have elevated INR - previously received vitamin K -INR trending down appropriately Lab Results  Component Value Date   INR 1.2 10/11/2020   INR 1.3 (H) 10/08/2020   INR 1.5 (H) 10/07/2020   Chronic diastolic congestive heart failure -currently appears to be euvolemic -continue home dose of lasix -continue to monitor volume status  Diabetes mellitus type 2, well controlled -Hold home dose of metformin -Continue on SSI -A1c from 4/25 noted to be 6 -blood sugars significantly elevated due to steroids -anticipate this should trend down as steroids are tapered -continue on lantus  AKI -likely related to IV diuretics -treated with limited IV fluids -now resolved  Hypertension -Continue on home dose of metoprolol  Splenomegaly -Noted on CT scan, felt to be related to CMML  Generalized weakness -Seen by physical therapy with recommendations for skilled nursing facility -CSW is working on placement  Right hip pain -Patient has a history of right intertrochanteric/subtrochanteric femur fracture in 04/2020 status post intramedullary nail by Dr. Doreatha Martin -She has had many issues since surgery including infection and prolonged antibiotics -She has had continued pain since her fracture -Since she is 5 months out from her fracture, orthopedics has ordered CT of her hip for further evaluation -appreciate their insight and recommendations  DVT prophylaxis: SCDs Start: 10/05/20 0719 given  coagulopathy as above  Code Status: Full code Family Communication: Discussed with patient Disposition Plan: Status is: Inpatient  Remains inpatient appropriate  because:Ongoing diagnostic testing needed not appropriate for outpatient work up, IV treatments appropriate due to intensity of illness or inability to take PO, and Inpatient level of care appropriate due to severity of illness  Dispo: The patient is from: Home              Anticipated d/c is to: SNF              Patient currently is not medically stable to d/c.   Difficult to place patient No  Consultants:  Oncology, orthopedic surgery  Antimicrobials:  Ceftriaxone 6/21 >6/25 Azithromycin 6/21 >6/25   Subjective: No acute issues or events overnight, patient denies nausea vomiting diarrhea constipation headache fevers or chills.  Objective: Vitals:   10/14/20 1237 10/14/20 1326 10/14/20 1600 10/14/20 1605  BP: (!) 92/43 (!) 94/46 (!) 99/50 (!) 103/48  Pulse: 77 88 75 77  Resp: _0 Temp: 98.2 F (36.8 C) 98.1 F (36.7 C) 98.1 F (36.7 C)   TempSrc: Oral Oral Oral   SpO2: 100% 100% 98% 98%  Weight:      Height:        Intake/Output Summary (Last 24 hours) at 10/14/2020 1623 Last data filed at 10/14/2020 1600 Gross per 24 hour  Intake 900 ml  Output 1000 ml  Net -100 ml   Filed Weights   10/12/20 2100 10/13/20 0618 10/14/20 0438  Weight: 103.4 kg 103 kg 102.3 kg    Examination:  General exam: Alert, awake, oriented x 3 Respiratory system: fine crackles at bases. Respiratory effort normal. Cardiovascular system:RRR. No murmurs, rubs, gallops. Gastrointestinal system: Abdomen is nondistended, soft and nontender. No organomegaly or masses felt. Normal bowel sounds heard. Central nervous system: Alert and oriented. No focal neurological deficits. Extremities: No C/C/E, +pedal pulses Skin: No rashes, lesions or ulcers  Data Reviewed: I have personally reviewed following labs and imaging studies  CBC: Recent Labs  Lab 10/10/20 0224 10/11/20 0036 10/12/20 0534 10/13/20 0523 10/14/20 0304  WBC 2.5* 1.7* 2.1* 2.0* 2.2*  NEUTROABS  --   --   --  0.6*  --    HGB 8.3* 7.9* 7.3* 7.8* 7.0*  HCT 24.9* 24.7* 22.1* 23.9* 21.3*  MCV 94.7 96.9 94.8 95.2 93.8  PLT 14* 13* 20* 21* 20*   Basic Metabolic Panel: Recent Labs  Lab 10/09/20 0159 10/10/20 0224 10/11/20 0036 10/13/20 0523 10/14/20 0304  NA 131* 137 133* 136 136  K 5.3* 5.4* 4.6 4.0 4.3  CL 102 107 100 105 106  CO2 19* _1 GLUCOSE 407* 187* 415* 222* 105*  BUN 53* 43* 31* 14 21  CREATININE 1.12* 0.83 0.89 0.52 0.49  CALCIUM 9.0 9.8 9.3 9.2 9.2  PHOS 5.7* 3.8  --   --   --    GFR: Estimated Creatinine Clearance: 76.1 mL/min (by C-G formula based on SCr of 0.49 mg/dL). Liver Function Tests: Recent Labs  Lab 10/09/20 0159 10/10/20 0224  ALBUMIN 2.4* 2.4*   No results for input(s): LIPASE, AMYLASE in the last 168 hours. No results for input(s): AMMONIA in the last 168 hours. Coagulation Profile: Recent Labs  Lab 10/08/20 0322 10/11/20 0036  INR 1.3* 1.2   Cardiac Enzymes: No results for input(s): CKTOTAL, CKMB, CKMBINDEX, TROPONINI in the last 168 hours. BNP (last 3 results) No results for input(s): PROBNP in the  last 8760 hours. HbA1C: No results for input(s): HGBA1C in the last 72 hours. CBG: Recent Labs  Lab 10/13/20 1109 10/13/20 1626 10/13/20 2045 10/14/20 0721 10/14/20 1135  GLUCAP 175* 349* 323* 130* 136*   Lipid Profile: No results for input(s): CHOL, HDL, LDLCALC, TRIG, CHOLHDL, LDLDIRECT in the last 72 hours. Thyroid Function Tests: No results for input(s): TSH, T4TOTAL, FREET4, T3FREE, THYROIDAB in the last 72 hours. Anemia Panel: No results for input(s): VITAMINB12, FOLATE, FERRITIN, TIBC, IRON, RETICCTPCT in the last 72 hours.  Sepsis Labs: No results for input(s): PROCALCITON, LATICACIDVEN in the last 168 hours.   Recent Results (from the past 240 hour(s))  SARS CORONAVIRUS 2 (TAT 6-24 HRS) Nasopharyngeal Nasopharyngeal Swab     Status: None   Collection Time: 10/05/20  2:52 AM   Specimen: Nasopharyngeal Swab  Result Value Ref  Range Status   SARS Coronavirus 2 NEGATIVE NEGATIVE Final    Comment: (NOTE) SARS-CoV-2 target nucleic acids are NOT DETECTED.  The SARS-CoV-2 RNA is generally detectable in upper and lower respiratory specimens during the acute phase of infection. Negative results do not preclude SARS-CoV-2 infection, do not rule out co-infections with other pathogens, and should not be used as the sole basis for treatment or other patient management decisions. Negative results must be combined with clinical observations, patient history, and epidemiological information. The expected result is Negative.  Fact Sheet for Patients: SugarRoll.be  Fact Sheet for Healthcare Providers: https://www.woods-mathews.com/  This test is not yet approved or cleared by the [place] FDA and  has been authorized for detection and/or diagnosis of SARS-CoV-2 by FDA under an Emergency Use Authorization (EUA). This EUA will remain  in effect (meaning this test can be used) for the duration of the COVID-19 declaration under Se ction 564(b)(1) of the Act, 21 U.S.C. section 360bbb-3(b)(1), unless the authorization is terminated or revoked sooner.  Performed at [facility], [place] [address], [place], [place] [address]   Culture, blood (routine x 2)     Status: None   Collection Time: 10/05/20  8:04 PM   Specimen: BLOOD RIGHT HAND  Result Value Ref Range Status   Specimen Description BLOOD RIGHT HAND  Final   Special Requests   Final    BOTTLES DRAWN AEROBIC AND ANAEROBIC Blood Culture results may not be optimal due to an inadequate volume of blood received in culture bottles   Culture   Final    NO GROWTH 5 DAYS Performed at [facility], [place] [address] [place], [place] [address]    Report Status 10/10/2020 FINAL  Final  Culture, blood (routine x 2)     Status: None   Collection Time: 10/05/20  8:05 PM   Specimen: BLOOD LEFT HAND  Result Value Ref Range  Status   Specimen Description BLOOD LEFT HAND  Final   Special Requests   Final    BOTTLES DRAWN AEROBIC AND ANAEROBIC Blood Culture results may not be optimal due to an inadequate volume of blood received in culture bottles   Culture   Final    NO GROWTH 5 DAYS Performed at [facility], [place] [address], [place], [place] [address]    Report Status 10/10/2020 FINAL  Final          Radiology Studies: No results found.      Scheduled Meds:  sodium chloride   Intravenous Once   budesonide (PULMICORT) nebulizer solution  0.25 mg Nebulization BID   diclofenac Sodium  2  g Topical TID AC & HS   furosemide  40 mg Oral Daily   guaiFENesin  600 mg Oral BID   insulin aspart  0-20 Units Subcutaneous TID WC   insulin aspart  0-5 Units Subcutaneous QHS   insulin aspart  3 Units Subcutaneous TID WC   insulin glargine  20 Units Subcutaneous QHS   ipratropium-albuterol  3 mL Nebulization BID   linaclotide  145 mcg Oral QAC breakfast   metoprolol tartrate  50 mg Oral q morning   And   metoprolol tartrate  25 mg Oral QPM   montelukast  10 mg Oral QHS   olopatadine  1 drop Both Eyes BID   predniSONE  30 mg Oral Q breakfast   pregabalin  100 mg Oral QHS   sertraline  100 mg Oral QHS   sodium chloride flush  3 mL Intravenous Q12H   traZODone  100 mg Oral QHS   Continuous Infusions:     LOS: 9 days    Time spent: 52mns    Kasim Mccorkle C Billie Intriago, DO [facility]   If 7PM-7AM, please contact night-coverage [email or web address]  10/14/2020, 4:23 PM

## 2020-10-14 NOTE — Plan of Care (Signed)
  Problem: Clinical Measurements: Goal: Respiratory complications will improve Outcome: Progressing   Problem: Activity: Goal: Risk for activity intolerance will decrease Outcome: Progressing   Problem: Pain Managment: Goal: General experience of comfort will improve Outcome: Progressing

## 2020-10-15 ENCOUNTER — Ambulatory Visit: Payer: 59

## 2020-10-15 DIAGNOSIS — J189 Pneumonia, unspecified organism: Secondary | ICD-10-CM | POA: Diagnosis not present

## 2020-10-15 DIAGNOSIS — C9310 Chronic myelomonocytic leukemia not having achieved remission: Secondary | ICD-10-CM | POA: Diagnosis not present

## 2020-10-15 DIAGNOSIS — I5032 Chronic diastolic (congestive) heart failure: Secondary | ICD-10-CM | POA: Diagnosis not present

## 2020-10-15 DIAGNOSIS — D649 Anemia, unspecified: Secondary | ICD-10-CM | POA: Diagnosis not present

## 2020-10-15 LAB — DIFFERENTIAL
Abs Immature Granulocytes: 0.02 10*3/uL (ref 0.00–0.07)
Basophils Absolute: 0 10*3/uL (ref 0.0–0.1)
Basophils Relative: 1 %
Eosinophils Absolute: 0 10*3/uL (ref 0.0–0.5)
Eosinophils Relative: 0 %
Immature Granulocytes: 1 %
Lymphocytes Relative: 60 %
Lymphs Abs: 1 10*3/uL (ref 0.7–4.0)
Monocytes Absolute: 0.1 10*3/uL (ref 0.1–1.0)
Monocytes Relative: 8 %
Neutro Abs: 0.5 10*3/uL — ABNORMAL LOW (ref 1.7–7.7)
Neutrophils Relative %: 30 %

## 2020-10-15 LAB — CBC
HCT: 22.4 % — ABNORMAL LOW (ref 36.0–46.0)
HCT: 22.4 % — ABNORMAL LOW (ref 36.0–46.0)
Hemoglobin: 7.3 g/dL — ABNORMAL LOW (ref 12.0–15.0)
Hemoglobin: 7.4 g/dL — ABNORMAL LOW (ref 12.0–15.0)
MCH: 30.7 pg (ref 26.0–34.0)
MCH: 30.7 pg (ref 26.0–34.0)
MCHC: 32.6 g/dL (ref 30.0–36.0)
MCHC: 33 g/dL (ref 30.0–36.0)
MCV: 94.1 fL (ref 80.0–100.0)
MCV: 92.9 fL (ref 80.0–100.0)
Platelets: 15 10*3/uL — CL (ref 150–400)
Platelets: 13 10*3/uL — CL (ref 150–400)
RBC: 2.38 MIL/uL — ABNORMAL LOW (ref 3.87–5.11)
RBC: 2.41 MIL/uL — ABNORMAL LOW (ref 3.87–5.11)
RDW: 16.6 % — ABNORMAL HIGH (ref 11.5–15.5)
RDW: 16.6 % — ABNORMAL HIGH (ref 11.5–15.5)
WBC: 1.8 10*3/uL — ABNORMAL LOW (ref 4.0–10.5)
WBC: 1.7 10*3/uL — ABNORMAL LOW (ref 4.0–10.5)
nRBC: 9.7 % — ABNORMAL HIGH (ref 0.0–0.2)
nRBC: 12 % — ABNORMAL HIGH (ref 0.0–0.2)

## 2020-10-15 LAB — GLUCOSE, CAPILLARY
Glucose-Capillary: 115 mg/dL — ABNORMAL HIGH (ref 70–99)
Glucose-Capillary: 187 mg/dL — ABNORMAL HIGH (ref 70–99)
Glucose-Capillary: 212 mg/dL — ABNORMAL HIGH (ref 70–99)
Glucose-Capillary: 260 mg/dL — ABNORMAL HIGH (ref 70–99)

## 2020-10-15 LAB — BASIC METABOLIC PANEL
Anion gap: 4 — ABNORMAL LOW (ref 5–15)
BUN: 21 mg/dL (ref 8–23)
CO2: 29 mmol/L (ref 22–32)
Calcium: 8.7 mg/dL — ABNORMAL LOW (ref 8.9–10.3)
Chloride: 104 mmol/L (ref 98–111)
Creatinine, Ser: 0.6 mg/dL (ref 0.44–1.00)
GFR, Estimated: >60 mL/min (ref >60)
Glucose, Bld: 196 mg/dL — ABNORMAL HIGH (ref 70–99)
Potassium: 4.4 mmol/L (ref 3.5–5.1)
Sodium: 137 mmol/L (ref 135–145)

## 2020-10-15 LAB — RETICULOCYTES
Immature Retic Fract: 14.5 % (ref 2.3–15.9)
RBC.: 2.39 MIL/uL — ABNORMAL LOW (ref 3.87–5.11)
Retic Count, Absolute: 17.2 10*3/uL — ABNORMAL LOW (ref 19.0–186.0)
Retic Ct Pct: 0.7 % (ref 0.4–3.1)

## 2020-10-15 LAB — HEMOGLOBIN AND HEMATOCRIT, BLOOD
HCT: 26.3 % — ABNORMAL LOW (ref 36.0–46.0)
Hemoglobin: 8.8 g/dL — ABNORMAL LOW (ref 12.0–15.0)

## 2020-10-15 LAB — ERYTHROPOIETIN: Erythropoietin: 365.1 m[IU]/mL — ABNORMAL HIGH (ref 2.6–18.5)

## 2020-10-15 LAB — PREPARE RBC (CROSSMATCH)

## 2020-10-15 MED ORDER — FLUCONAZOLE 150 MG PO TABS
150.0000 mg | ORAL_TABLET | Freq: Once | ORAL | Status: AC
  Administered 2020-10-15: 150 mg via ORAL
  Filled 2020-10-15: qty 1

## 2020-10-15 MED ORDER — SODIUM CHLORIDE 0.9% IV SOLUTION: Freq: Once | INTRAVENOUS | Status: DC

## 2020-10-15 NOTE — Care Management Important Message (Signed)
Important Message  Patient Details  Name: Kathy Irwin MRN: [id number] Date of Birth: 01/21/1951   Medicare Important Message Given:  Yes - Important Message mailed due to current National Emergency  Verbal consent obtained due to current National Emergency  Relationship to patient: Brother/Sister Contact Name: Kathy Irwin Call Date: 10/15/20  Time: 1144 Phone: [phone] Outcome: Spoke with contact Important Message mailed to: Other (must enter comment) (additional copy of IM not needed)    Kathy Irwin 10/15/2020, 11:47 AM

## 2020-10-15 NOTE — Progress Notes (Addendum)
IP PROGRESS NOTE  Subjective:   Denies shortness of breath.  No chest discomfort reported.  No bleeding reported.  Received another unit of PRBCs on 6/30.  Hemoglobin 7.4 today.  Continues to report ongoing right hip pain.  Working with PT at the time my visit.  Objective: Vital signs in last 24 hours: Blood pressure (!) 96/50, pulse 79, temperature 98.3 F (36.8 C), resp. rate 16, height 5' 4" (1.626 m), weight 102.3 kg, SpO2 95 %.  Intake/Output from previous day: 06/30 0701 - 07/01 0700 In: 1615 [P.O.:1295; Blood:320] Out: 450 [Urine:450]  Physical Exam:  HEENT: No thrush or bleeding Lungs: Clear bilaterally Cardiac: Regular rate and rhythm, 2/6 systolic murmur Abdomen: Nontender, no hepatosplenomegaly Extremities: No leg edema   Lab Results: Recent Labs    10/14/20 0304 10/14/20 1830 10/15/20 0420  WBC 2.2*  --  1.8*  HGB 7.0* 9.2* 7.3*  HCT 21.3* 27.9* 22.4*  PLT 20*  --  15*    BMET Recent Labs    10/14/20 0304 10/15/20 0420  NA 136 137  K 4.3 4.4  CL 106 104  CO2 26 29  GLUCOSE 105* 196*  BUN 21 21  CREATININE 0.49 0.60  CALCIUM 9.2 8.7*    No results found for: CEA1  Studies/Results: No results found.  Medications: I have reviewed the patient's current medications.  Assessment/Plan: 1. CMML, Currently at D26 s/p cycle 2 5-azacytidine 2.  Anemia 3.  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  SIRS 5.  Chronic respiratory failure with hypoxia and interstitial lung disease,?  Healthcare associated pneumonia 6.  Coagulopathy 7.  Diastolic CHF 8.  Hypertension 9.  Diabetes mellitus 10.  Anxiety 11.  Hyperbilirubinemia 12.  Splenomegaly secondary to #1   Ms. Mikesell appears unchanged.  Hemoglobin today is 7.4.  Platelets are 13,000 today.  WBC and ANC are trending downward.  She is not symptomatic today.  She is not actively bleeding.  Reticulocytes not elevated.  Erythropoietin level pending.  No need for  transfusion today.  The patient has persistent pancytopenia due to her underlying CMML and recent 5-azacytidine.  She is now at day 26 of cycle #2.  She remains transfusion dependent.  She receives Nplate 5 mcg/kg weekly.  This is next due on 7/5.  She is also due to begin cycle #3 of 5-azacytidine in our office on 7/5.  We will discuss her case further with her primary oncologist, but we may need to consider a repeat bone marrow biopsy in the near future.  Recommendations: Transfuse PRBCs for symptomatic anemia and transfuse platelets for active bleeding. Continue physical therapy and increase ambulation as tolerated. Follow-up as previously scheduled at the cancer center on 10/19/2020 to begin next cycle of 5-azacytidine and to receive her next dose of Nplate.  Please call medical oncology for questions.  We will check on her again early next week if she remains in the hospital.   LOS: 10 days   [name], NP   10/15/2020, 8:12 AM  Kathy Irwin was interviewed and examined.  She has persistent pancytopenia now at day 26 following cycle two 5-azacytidine.  The hemoglobin remains low despite multiple Red cell transfusions during this hospital admission.  She denies bleeding.  We will check a reticulocyte count and erythropoietin level.  I recommend continuing Red cell transfusions for symptoms or hemoglobin of less than 7.  She continues weekly Nplate.  There is no indication for platelet transfusion at present.  She   is scheduled for outpatient follow-up at the Cancer center on 10/19/2020.  We will see her on 10/18/2020 if she remains in the hospital.  Please call hematology as needed.  I will discuss the case with Dr. Kale regarding the plans for continuing 5-azacytidine or switching to a different treatment regimen for the CMML.  She may need another bone marrow biopsy.  I discussed this possibility with her.  I was present for greater than 50% of today's visit.  I performed medical decision  making.   

## 2020-10-15 NOTE — TOC Progression Note (Addendum)
Transition of Care [facility]) - Progression Note    Patient Details  Name: Kathy Irwin MRN: [id number] Date of Birth: [birth year]  Transition of Care [facility]) CM/SW Contact  Kathy Salina Mila Homer, LCSW Phone Number: 10/15/2020, 8:43 AM  Clinical Narrative:  Talked with patient at bedside on 6/30 regarding her discharge disposition and her workman's compensation. Patient reported that she has received another check from workman's compensation Monday. Kathy Irwin advised that she could go to a facility using her Medicaid, however she would not receive rehab services, just nursing care. Kathy Irwin asked about the cost for rehab services and was informed that contact would be made with a facility to determine how much rehab services are. Patient added that she can pay no more than $3,000 towards any services. Kathy Irwin also asked about the cost of outpatient therapy services.    In terms of her discharging home with family support, Kathy Irwin reported that her family is going out of town for the 4th and are leaving on Friday and will be gone for a week.   6/28 at 12:30 pm: Voice mail from daughter Kathy Irwin informing CSW that she heard from workman's compensation and they will pay for her mom's rehab.  7/1: Talked with daughter Kathy Irwin by phone regarding her her voicemail and she indicated that CSW was supposed to hear from someone with workman's compensation. Kathy Irwin ask that CSW call her Kathy Irwin [phone]).    7/1 at 10:54 am: Talked with Kathy Irwin with [facility] Case management regarding patient and being informed that they would pay for rehab. Per Ms. Coats, they have to determine if this hospitalization is connected with the injury that occurred at the daycare and initiated the workman's compensation claim. She informed CSW that she a consent form will be sent to CSW for patient to sign, giving permission for needed medical records to be sent to them. Form faxed to CSW, signed by patient and faxed  back to Kathy Irwin. Based on the review of requested information, [place] Case Management will determine if they will pay for patient to discharge to a skilled facility for rehab.      7/1 at 11 am: Talked with patient and her sister Kathy Irwin at bedside regarding her discharge plan. Patient and family still feel she needs rehab and CSW informed them of conversation with patient's daughter Kathy Irwin regarding workman's compensation paying for rehab and CSW's attempt to reach Kathy Irwin with [place] [employer]. Sister contacted Kathy Irwin at [facility] [place]) and was told that for Medicaid to pay for patient's stay in a skilled nursing facility, her sister would have to be in the skilled facility and the facility would send them an FL-2. Patient's case worker is Kathy Irwin [phone]).  **Requested clinicals faxed to [place] Case management [phone] (fax number). Followed up with Kathy Irwin, case manager regarding information being faxed. Informed her that information was sent in 2 separate faxes. Received a text from Ms. Coats at 3:52 pm that both fax transmissions received.    CSW will continue to follow and provide SW interventions services as needed through discharge. The consent form signed by patient placed in her chart.  4:16 pm: Talked with Kathy Irwin with [place] Case management. She received the H&P, but no other documents via that fax. When asked, Kathy Irwin informed that there are no ortho notes or notes(s) by Kathy Irwin. Clinical information faxed again and received by Ms. Coats   Expected Discharge Plan:  Skilled Nursing Facility Barriers to Discharge: Continued Medical Work up  Expected Discharge Plan and Services Expected Discharge Plan: [place] In-house Referral: Clinical Social Work     Living arrangements for the past 2 months: Single Family Home                                     Social  Determinants of Health (SDOH) Interventions  No SDOH interventions requested or needed at this time  Readmission Risk Interventions Readmission Risk Prevention Plan 08/12/2019  Transportation Screening Complete  PCP or Specialist Appt within 3-5 Days Complete  HRI or [place] Complete  Social Work Consult for [place] Planning/Counseling Complete  Palliative Care Screening Not Applicable  Medication Review [employer]) Complete  Some recent data might be hidden

## 2020-10-15 NOTE — Progress Notes (Signed)
PROGRESS NOTE    Kathy Irwin  UUV:[id number] DOB: 12-May-1950 DOA: 10/04/2020 PCP: Kerin Perna, NP    Brief Narrative:  70 year old female with history of CMML, chronic ITP, diastolic heart failure, diabetes, hypertension, presents to the hospital with complaints of shortness of breath.  She was noted to be getting dizzy and breaking out in a sweat when she was standing up.  Hemoglobin noted to be 5.  She was also significantly thrombocytopenic.  CTA chest negative for PE.  She was transfused PRBC and oncology was consulted.  Assessment & Plan:   Active Problems:   Type 2 diabetes mellitus with complication, without long-term current use of insulin (HCC)   HTN (hypertension)   Obesity (BMI 30-39.9)   Chronic diastolic CHF (congestive heart failure) (HCC)   HCAP (healthcare-associated pneumonia)   Hyperbilirubinemia   Idiopathic thrombocytopenia (HCC)   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HCC)   Symptomatic anemia   Symptomatic anemia/pancytopenia, POA CMML -Likely related to chronic myelomonocytic leukemia and recent chemotherapy - defer to Oncology -Hemoglobin of 5.1 on admission -  -She has received a total of 6 units prbc thus far (additional unit given today for a total of 6)    Component Value Date/Time   HGB 7.3 (L) 10/15/2020 0420   HGB 6.0 (LL) 09/28/2020 1326   HGB 11.2 12/30/2019 1159   HCT 22.4 (L) 10/15/2020 0420   HCT 20.0 (L) 10/06/2020 0423  -She does not have any sources of bleeding at this time -likely secondary to CMML per discussion with oncology -continue to monitor  SIRS without source -Possibly related to anemia  -Hemodynamics appear to be stabilizing -Blood cultures with no growth -sepsis ruled out at this time  Acute on chronic respiratory failure with hypoxia -She is chronically on 2 to 3 L of oxygen -CT chest was negative for pulmonary embolus -CT did comment on possible interstitial lung disease versus pneumonia -She has  completed a course of antibiotics -Currently on prednisone taper -Continue bronchodilators to treat any element of pulm fibrosis -Overall respiratory status improving  Thrombocytopenia -Likely secondary to above CMML versus chemotherapy, she also has a component of ITP -She received Nplate (Romiplostim) 4/74, repeated 6/28 -Downtrending over the past 48 hours  Coagulopathy -Noted to have elevated INR - previously received vitamin K -INR trending down appropriately Lab Results  Component Value Date   INR 1.2 10/11/2020   INR 1.3 (H) 10/08/2020   INR 1.5 (H) 10/07/2020   Chronic diastolic congestive heart failure -currently appears to be euvolemic -continue home dose of lasix -continue to monitor volume status  Diabetes mellitus type 2, well controlled -Hold home dose of metformin -Continue on SSI -A1c from 4/25 noted to be 6 -blood sugars significantly elevated due to steroids -anticipate this should trend down as steroids are tapered -continue on lantus  AKI -likely related to IV diuretics -treated with limited IV fluids -now resolved  Hypertension -Continue on home dose of metoprolol  Splenomegaly -Noted on CT scan, felt to be related to CMML  Generalized weakness -Seen by physical therapy with recommendations for skilled nursing facility -CSW is working on placement  Right hip pain -Patient has a history of right intertrochanteric/subtrochanteric femur fracture in 04/2020 status post intramedullary nail by Dr. Doreatha Martin -She has had many issues since surgery including infection and prolonged antibiotics -She has had continued pain since her fracture -Since she is 5 months out from her fracture, orthopedics has ordered CT of her hip for further evaluation -appreciate their insight and  recommendations  DVT prophylaxis: SCDs Start: 10/05/20 0719 given coagulopathy as above  Code Status: Full code Family Communication: Discussed with patient Disposition Plan: Status  is: Inpatient  Remains inpatient appropriate because:Ongoing diagnostic testing needed not appropriate for outpatient work up, IV treatments appropriate due to intensity of illness or inability to take PO, and Inpatient level of care appropriate due to severity of illness  Dispo: The patient is from: Home              Anticipated d/c is to: SNF              Patient currently is not medically stable to d/c.   Difficult to place patient No  Consultants:  Oncology, orthopedic surgery  Antimicrobials:  Ceftriaxone 6/21 >6/25 Azithromycin 6/21 >6/25   Subjective: Hemoglobin downtrending again overnight as her platelets, patient states she feels somewhat more fatigued today than previously but otherwise denies nausea vomiting diarrhea constipation headache fevers or chills.  She also reports discomfort and itching around her external catheter concerning for yeast infection.  Objective: Vitals:   10/14/20 1922 10/14/20 2055 10/15/20 0455 10/15/20 0543  BP:  (!) 104/53 (!) 96/50   Pulse:  98 79   Resp:  _0 Temp:  97.7 F (36.5 C) 98.3 F (36.8 C)   TempSrc:  Oral    SpO2: 99% 94% 96% 95%  Weight:      Height:        Intake/Output Summary (Last 24 hours) at 10/15/2020 0812 Last data filed at 10/15/2020 0455 Gross per 24 hour  Intake 1615 ml  Output 450 ml  Net 1165 ml    Filed Weights   10/12/20 2100 10/13/20 0618 10/14/20 0438  Weight: 103.4 kg 103 kg 102.3 kg    Examination:  General exam: Alert, awake, oriented x 3 Respiratory system: fine crackles at bases. Respiratory effort normal. Cardiovascular system:RRR. No murmurs, rubs, gallops. Gastrointestinal system: Abdomen is nondistended, soft and nontender. No organomegaly or masses felt. Normal bowel sounds heard. Central nervous system: Alert and oriented. No focal neurological deficits. Extremities: No C/C/E, +pedal pulses Skin: No rashes, lesions or ulcers  Data Reviewed: I have personally reviewed following  labs and imaging studies  CBC: Recent Labs  Lab 10/11/20 0036 10/12/20 0534 10/13/20 0523 10/14/20 0304 10/14/20 1830 10/15/20 0420  WBC 1.7* 2.1* 2.0* 2.2*  --  1.8*  NEUTROABS  --   --  0.6*  --   --   --   HGB 7.9* 7.3* 7.8* 7.0* 9.2* 7.3*  HCT 24.7* 22.1* 23.9* 21.3* 27.9* 22.4*  MCV 96.9 94.8 95.2 93.8  --  94.1  PLT 13* 20* 21* 20*  --  15*    Basic Metabolic Panel: Recent Labs  Lab 10/09/20 0159 10/10/20 0224 10/11/20 0036 10/13/20 0523 10/14/20 0304 10/15/20 0420  NA 131* 137 133* 136 136 137  K 5.3* 5.4* 4.6 4.0 4.3 4.4  CL 102 107 100 105 106 104  CO2 19* _1 GLUCOSE 407* 187* 415* 222* 105* 196*  BUN 53* 43* 31* _2 CREATININE 1.12* 0.83 0.89 0.52 0.49 0.60  CALCIUM 9.0 9.8 9.3 9.2 9.2 8.7*  PHOS 5.7* 3.8  --   --   --   --     GFR: Estimated Creatinine Clearance: 76.1 mL/min (by C-G formula based on SCr of 0.6 mg/dL). Liver Function Tests: Recent Labs  Lab 10/09/20 0159 10/10/20 0224  ALBUMIN 2.4* 2.4*  No results for input(s): LIPASE, AMYLASE in the last 168 hours. No results for input(s): AMMONIA in the last 168 hours. Coagulation Profile: Recent Labs  Lab 10/11/20 0036  INR 1.2    Cardiac Enzymes: No results for input(s): CKTOTAL, CKMB, CKMBINDEX, TROPONINI in the last 168 hours. BNP (last 3 results) No results for input(s): PROBNP in the last 8760 hours. HbA1C: No results for input(s): HGBA1C in the last 72 hours. CBG: Recent Labs  Lab 10/14/20 0721 10/14/20 1135 10/14/20 1647 10/14/20 2056 10/15/20 0648  GLUCAP 130* 136* 105* 260* 115*    Lipid Profile: No results for input(s): CHOL, HDL, LDLCALC, TRIG, CHOLHDL, LDLDIRECT in the last 72 hours. Thyroid Function Tests: No results for input(s): TSH, T4TOTAL, FREET4, T3FREE, THYROIDAB in the last 72 hours. Anemia Panel: No results for input(s): VITAMINB12, FOLATE, FERRITIN, TIBC, IRON, RETICCTPCT in the last 72 hours.  Sepsis Labs: No results for  input(s): PROCALCITON, LATICACIDVEN in the last 168 hours.   Recent Results (from the past 240 hour(s))  Culture, blood (routine x 2)     Status: None   Collection Time: 10/05/20  8:04 PM   Specimen: BLOOD RIGHT HAND  Result Value Ref Range Status   Specimen Description BLOOD RIGHT HAND  Final   Special Requests   Final    BOTTLES DRAWN AEROBIC AND ANAEROBIC Blood Culture results may not be optimal due to an inadequate volume of blood received in culture bottles   Culture   Final    NO GROWTH 5 DAYS Performed at [facility], [place] [address] [place], [place] [address]    Report Status 10/10/2020 FINAL  Final  Culture, blood (routine x 2)     Status: None   Collection Time: 10/05/20  8:05 PM   Specimen: BLOOD LEFT HAND  Result Value Ref Range Status   Specimen Description BLOOD LEFT HAND  Final   Special Requests   Final    BOTTLES DRAWN AEROBIC AND ANAEROBIC Blood Culture results may not be optimal due to an inadequate volume of blood received in culture bottles   Culture   Final    NO GROWTH 5 DAYS Performed at [facility], [place] [address], [place], [place] [address]    Report Status 10/10/2020 FINAL  Final          Radiology Studies: No results found.      Scheduled Meds:  sodium chloride   Intravenous Once   budesonide (PULMICORT) nebulizer solution  0.25 mg Nebulization BID   diclofenac Sodium  2 g Topical TID AC & HS   furosemide  40 mg Oral Daily   guaiFENesin  600 mg Oral BID   insulin aspart  0-20 Units Subcutaneous TID WC   insulin aspart  0-5 Units Subcutaneous QHS   insulin aspart  3 Units Subcutaneous TID WC   insulin glargine  20 Units Subcutaneous QHS   ipratropium-albuterol  3 mL Nebulization BID   linaclotide  145 mcg Oral QAC breakfast   metoprolol tartrate  50 mg Oral q morning   And   metoprolol tartrate  25 mg Oral QPM   montelukast  10 mg Oral QHS   olopatadine  1 drop Both Eyes BID   predniSONE  30 mg Oral Q breakfast    pregabalin  100 mg Oral QHS   sertraline  100 mg Oral QHS   sodium chloride flush  3 mL Intravenous Q12H   traZODone  100 mg Oral QHS  Continuous Infusions:     LOS: 10 days    Time spent: 61mns    Hunt Zajicek C Aliyah Abeyta, DO [facility]   If 7PM-7AM, please contact night-coverage [email or web address]  10/15/2020, 8:12 AM

## 2020-10-15 NOTE — Progress Notes (Signed)
Physical Therapy Treatment Patient Details Name: Kathy Irwin MRN: [id number] DOB: Aug 27, 1950 Today's Date: 10/15/2020    History of Present Illness 70 y.o. female presents to ED 10/04/20 with continued complaints of SoB and chest tightness. Presented to [facility] with similar complaints 6/17 In ED  HGB 5.1 and received 2 units of PRBC Chest x-ray noted improving bibasilar infiltrates compared to x-ray from 6/17 CT of the chest performed which was negative for PE but did show increase in her splenomegaly. 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  She last had received infusion with romiplostim on 6/14.    PT Comments    Pt satting at 88-89% on RA on arrival. Pt tolerates bed mobility without requirement of assistance, but requires assistance for transfers. Pt ambulates for household distances with use of RW, limited by fatigue and requesting to rest. Pt demonstrates generalized weakness and deficits in endurance, power, activity tolerance, and balance and will benefit from continued acute PT to maximize independence in mobility.    Follow Up Recommendations  SNF     Equipment Recommendations  None recommended by PT    Recommendations for Other Services       Precautions / Restrictions Precautions Precautions: Fall Precaution Comments: watch O2 Restrictions Weight Bearing Restrictions: No    Mobility  Bed Mobility Overal bed mobility: Needs Assistance Bed Mobility: Supine to Sit     Supine to sit: Min guard;HOB elevated     General bed mobility comments: min G for safety and HOB elevated    Transfers Overall transfer level: Needs assistance Equipment used: Rolling walker (2 wheeled) Transfers: Sit to/from Stand Sit to Stand: Min assist;Max assist         General transfer comment: pt min A to rise from EOB, max A to rise from  commode due to lower surface  Ambulation/Gait Ambulation/Gait assistance: Min guard Gait Distance (Feet): 20 Feet (x 2 bouts) Assistive device: Rolling walker (2 wheeled) Gait Pattern/deviations: Step-through pattern;Decreased stride length;Decreased weight shift to right;Trunk flexed Gait velocity: reduced Gait velocity interpretation: 1.31 - 2.62 ft/sec, indicative of limited community ambulator General Gait Details: slow mostly steady step-through gait.   Stairs             Wheelchair Mobility    Modified Rankin (Stroke Patients Only)       Balance Overall balance assessment: Needs assistance Sitting-balance support: Feet supported Sitting balance-Leahy Scale: Fair     Standing balance support: Single extremity supported;During functional activity Standing balance-Leahy Scale: Poor Standing balance comment: reliant on at least single UE support for balance                            Cognition Arousal/Alertness: Awake/alert Behavior During Therapy: WFL for tasks assessed/performed Overall Cognitive Status: Within Functional Limits for tasks assessed                                        Exercises      General Comments General comments (skin integrity, edema, etc.): pt initially satting at 88-89% on RA on arrival. pt placed on 2 L and maintaining oxygen sat levels at or above 93%.      Pertinent Vitals/Pain Pain Assessment: Faces Faces Pain  Scale: Hurts little more Pain Location: R LE Pain Descriptors / Indicators: Discomfort Pain Intervention(s): Monitored during session;Patient requesting pain meds-RN notified;Limited activity within patient's tolerance    Home Living                      Prior Function            PT Goals (current goals can now be found in the care plan section) Progress towards PT goals: Progressing toward goals    Frequency    Min 3X/week      PT Plan Current plan remains  appropriate    Co-evaluation              AM-PAC PT "6 Clicks" Mobility   Outcome Measure  Help needed turning from your back to your side while in a flat bed without using bedrails?: None Help needed moving from lying on your back to sitting on the side of a flat bed without using bedrails?: A Little Help needed moving to and from a bed to a chair (including a wheelchair)?: A Little Help needed standing up from a chair using your arms (e.g., wheelchair or bedside chair)?: A Little Help needed to walk in hospital room?: A Little Help needed climbing 3-5 steps with a railing? : A Lot 6 Click Score: 18    End of Session Equipment Utilized During Treatment: Gait belt;Oxygen Activity Tolerance: Patient limited by fatigue;Patient limited by pain Patient left: in chair;with call bell/phone within reach;with chair alarm set Nurse Communication: Mobility status;Other (comment) (pt request for pain meds; oxygen sat levels) PT Visit Diagnosis: Other abnormalities of gait and mobility (R26.89);Muscle weakness (generalized) (M62.81);Difficulty in walking, not elsewhere classified (R26.2);Pain Pain - Right/Left: Right Pain - part of body: Hip     Time: 9584-4171 PT Time Calculation (min) (ACUTE ONLY): 41 min  Charges:  $Therapeutic Activity: 23-37 mins                     Acute Rehab  Pager: [phone]    [employer], SPT  10/15/2020, 1:48 PM

## 2020-10-15 NOTE — Plan of Care (Signed)

## 2020-10-16 DIAGNOSIS — J189 Pneumonia, unspecified organism: Secondary | ICD-10-CM | POA: Diagnosis not present

## 2020-10-16 DIAGNOSIS — C9310 Chronic myelomonocytic leukemia not having achieved remission: Secondary | ICD-10-CM | POA: Diagnosis not present

## 2020-10-16 DIAGNOSIS — D649 Anemia, unspecified: Secondary | ICD-10-CM | POA: Diagnosis not present

## 2020-10-16 DIAGNOSIS — I5032 Chronic diastolic (congestive) heart failure: Secondary | ICD-10-CM | POA: Diagnosis not present

## 2020-10-16 LAB — BPAM RBC
Blood Product Expiration Date: 202207072359
Blood Product Expiration Date: 202207222359
Blood Product Expiration Date: 202207282359
ISSUE DATE / TIME: 202206281606
ISSUE DATE / TIME: 202206301302
ISSUE DATE / TIME: 202207011700
Unit Type and Rh: 6200
Unit Type and Rh: 6200
Unit Type and Rh: 8400

## 2020-10-16 LAB — BASIC METABOLIC PANEL
Anion gap: 7 (ref 5–15)
BUN: 21 mg/dL (ref 8–23)
CO2: 26 mmol/L (ref 22–32)
Calcium: 9.1 mg/dL (ref 8.9–10.3)
Chloride: 100 mmol/L (ref 98–111)
Creatinine, Ser: 0.54 mg/dL (ref 0.44–1.00)
GFR, Estimated: >60 mL/min (ref >60)
Glucose, Bld: 183 mg/dL — ABNORMAL HIGH (ref 70–99)
Potassium: 4.3 mmol/L (ref 3.5–5.1)
Sodium: 133 mmol/L — ABNORMAL LOW (ref 135–145)

## 2020-10-16 LAB — TYPE AND SCREEN
ABO/RH(D): AB POS
Antibody Screen: NEGATIVE
Unit division: 0
Unit division: 0
Unit division: 0

## 2020-10-16 LAB — CBC
HCT: 24.2 % — ABNORMAL LOW (ref 36.0–46.0)
Hemoglobin: 8 g/dL — ABNORMAL LOW (ref 12.0–15.0)
MCH: 30.9 pg (ref 26.0–34.0)
MCHC: 33.1 g/dL (ref 30.0–36.0)
MCV: 93.4 fL (ref 80.0–100.0)
Platelets: 14 10*3/uL — CL (ref 150–400)
RBC: 2.59 MIL/uL — ABNORMAL LOW (ref 3.87–5.11)
RDW: 16.1 % — ABNORMAL HIGH (ref 11.5–15.5)
WBC: 2.2 10*3/uL — ABNORMAL LOW (ref 4.0–10.5)
nRBC: 11.6 % — ABNORMAL HIGH (ref 0.0–0.2)

## 2020-10-16 LAB — GLUCOSE, CAPILLARY
Glucose-Capillary: 103 mg/dL — ABNORMAL HIGH (ref 70–99)
Glucose-Capillary: 191 mg/dL — ABNORMAL HIGH (ref 70–99)
Glucose-Capillary: 273 mg/dL — ABNORMAL HIGH (ref 70–99)
Glucose-Capillary: 287 mg/dL — ABNORMAL HIGH (ref 70–99)

## 2020-10-16 NOTE — Progress Notes (Signed)
PROGRESS NOTE    Kathy Irwin  XNA:[id number] DOB: 12-18-1950 DOA: 10/04/2020 PCP: Kerin Perna, NP    Brief Narrative:  70 year old female with history of CMML, chronic ITP, diastolic heart failure, diabetes, hypertension, presents to the hospital with complaints of shortness of breath.  She was noted to be getting dizzy and breaking out in a sweat when she was standing up.  Hemoglobin noted to be 5.  She was also significantly thrombocytopenic.  CTA chest negative for PE.  She was transfused PRBC and oncology was consulted.  Assessment & Plan:   Active Problems:   Type 2 diabetes mellitus with complication, without long-term current use of insulin (HCC)   HTN (hypertension)   Obesity (BMI 30-39.9)   Chronic diastolic CHF (congestive heart failure) (HCC)   HCAP (healthcare-associated pneumonia)   Hyperbilirubinemia   Idiopathic thrombocytopenia (HCC)   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HCC)   Symptomatic anemia   Symptomatic anemia/pancytopenia, POA CMML -Likely related to chronic myelomonocytic leukemia and recent chemotherapy - defer to Oncology -Hemoglobin of 5.1 on admission -  -She has received a total of 6 units prbc thus far    Component Value Date/Time   HGB 8.8 (L) 10/15/2020 2227   HGB 6.0 (LL) 09/28/2020 1326   HGB 11.2 12/30/2019 1159   HCT 26.3 (L) 10/15/2020 2227   HCT 20.0 (L) 10/06/2020 0423  -She does not have any sources of bleeding at this time -likely secondary to CMML per discussion with oncology -continue to monitor  SIRS without source, sepsis ruled out -Possibly related to anemia  -Hemodynamics appear to be stabilizing -Blood cultures with no growth -sepsis ruled out at this time  Acute on chronic respiratory failure with hypoxia -She is chronically on 2 to 3 L of oxygen -CT chest was negative for pulmonary embolus -CT did comment on possible interstitial lung disease versus pneumonia -She has completed antibiotic  course -Currently on prednisone taper -Continue bronchodilators to treat any element of pulm fibrosis -Overall respiratory status improving  Thrombocytopenia -Likely secondary to above CMML versus chemotherapy, she also has a component of ITP -She received Nplate (Romiplostim) 5/42, repeated 6/28 Lab Results  Component Value Date   PLT 14 (LL) 10/16/2020   Coagulopathy -Noted to have elevated INR - previously received vitamin K -INR trending down appropriately Lab Results  Component Value Date   INR 1.2 10/11/2020   INR 1.3 (H) 10/08/2020   INR 1.5 (H) 10/07/2020   Chronic diastolic congestive heart failure -currently appears to be euvolemic -continue home dose of lasix -continue to monitor volume status  Diabetes mellitus type 2, well controlled -Hold home dose of metformin -Continue on SSI -A1c from 4/25 noted to be 6 -blood sugars significantly elevated due to steroids -anticipate this should trend down as steroids are tapered -continue on lantus  AKI -likely related to IV diuretics -treated with limited IV fluids -now resolved  Hypertension -Continue metoprolol  Splenomegaly -Noted on CT scan, felt to be related to CMML  Generalized weakness -Seen by physical therapy with recommendations for skilled nursing facility -CSW is working on placement  Right hip pain -Patient has a history of right intertrochanteric/subtrochanteric femur fracture in 04/2020 status post intramedullary nail by Dr. Doreatha Martin -She has had many issues since surgery including infection and prolonged antibiotics -She has had continued pain since her fracture -Since she is 5 months out from her fracture, orthopedics has ordered CT of her hip for further evaluation -appreciate their insight and recommendations  DVT prophylaxis: SCDs  Start: 10/05/20 0719 given coagulopathy as above  Code Status: Full code Family Communication: Discussed with patient Disposition Plan: Status is:  Inpatient  Remains inpatient appropriate because:Ongoing diagnostic testing needed not appropriate for outpatient work up, IV treatments appropriate due to intensity of illness or inability to take PO, and Inpatient level of care appropriate due to severity of illness  Dispo: The patient is from: Home              Anticipated d/c is to: SNF              Patient currently is not medically stable to d/c.   Difficult to place patient No  Consultants:  Oncology, orthopedic surgery  Antimicrobials:  Ceftriaxone 6/21 >6/25 Azithromycin 6/21 >6/25   Subjective: No acute issues or events overnight, patient reports poor sleep overnight but otherwise denies nausea vomiting diarrhea constipation headache fevers or chills.   Objective: Vitals:   10/15/20 1951 10/15/20 2036 10/16/20 0548 10/16/20 0709  BP:  (!) 101/44 (!) 103/56   Pulse:  82 67   Resp:  20 18   Temp:  97.9 F (36.6 C) 98.6 F (37 C)   TempSrc:  Oral Oral   SpO2: 98% 100% 100% 98%  Weight:      Height:        Intake/Output Summary (Last 24 hours) at 10/16/2020 0801 Last data filed at 10/16/2020 0600 Gross per 24 hour  Intake 543 ml  Output 1100 ml  Net -557 ml   Filed Weights   10/12/20 2100 10/13/20 0618 10/14/20 0438  Weight: 103.4 kg 103 kg 102.3 kg    Examination:  General exam: Alert, awake, oriented x 3 Respiratory system: fine crackles at bases. Respiratory effort normal. Cardiovascular system:RRR. No murmurs, rubs, gallops. Gastrointestinal system: Abdomen is nondistended, soft and nontender. No organomegaly or masses felt. Normal bowel sounds heard. Central nervous system: Alert and oriented. No focal neurological deficits. Extremities: No C/C/E, +pedal pulses Skin: No rashes, lesions or ulcers  Data Reviewed: I have personally reviewed following labs and imaging studies  CBC: Recent Labs  Lab 10/11/20 0036 10/12/20 0534 10/13/20 0523 10/14/20 0304 10/14/20 1830 10/15/20 0420 10/15/20 2227   WBC 1.7* 2.1* 2.0* 2.2*  --  1.7*  1.8*  --   NEUTROABS  --   --  0.6*  --   --  0.5*  --   HGB 7.9* 7.3* 7.8* 7.0* 9.2* 7.4*  7.3* 8.8*  HCT 24.7* 22.1* 23.9* 21.3* 27.9* 22.4*  22.4* 26.3*  MCV 96.9 94.8 95.2 93.8  --  92.9  94.1  --   PLT 13* 20* 21* 20*  --  13*  15*  --    Basic Metabolic Panel: Recent Labs  Lab 10/10/20 0224 10/11/20 0036 10/13/20 0523 10/14/20 0304 10/15/20 0420 10/16/20 0416  NA 137 133* 136 136 137 133*  K 5.4* 4.6 4.0 4.3 4.4 4.3  CL 107 100 105 106 104 100  CO2 _0 GLUCOSE 187* 415* 222* 105* 196* 183*  BUN 43* 31* _1 CREATININE 0.83 0.89 0.52 0.49 0.60 0.54  CALCIUM 9.8 9.3 9.2 9.2 8.7* 9.1  PHOS 3.8  --   --   --   --   --    GFR: Estimated Creatinine Clearance: 76.1 mL/min (by C-G formula based on SCr of 0.54 mg/dL). Liver Function Tests: Recent Labs  Lab 10/10/20 0224  ALBUMIN 2.4*   No results for input(s): LIPASE,  AMYLASE in the last 168 hours. No results for input(s): AMMONIA in the last 168 hours. Coagulation Profile: Recent Labs  Lab 10/11/20 0036  INR 1.2   Cardiac Enzymes: No results for input(s): CKTOTAL, CKMB, CKMBINDEX, TROPONINI in the last 168 hours. BNP (last 3 results) No results for input(s): PROBNP in the last 8760 hours. HbA1C: No results for input(s): HGBA1C in the last 72 hours. CBG: Recent Labs  Lab 10/15/20 0648 10/15/20 1303 10/15/20 1625 10/15/20 2034 10/16/20 0636  GLUCAP 115* 187* 212* 260* 103*   Lipid Profile: No results for input(s): CHOL, HDL, LDLCALC, TRIG, CHOLHDL, LDLDIRECT in the last 72 hours. Thyroid Function Tests: No results for input(s): TSH, T4TOTAL, FREET4, T3FREE, THYROIDAB in the last 72 hours. Anemia Panel: Recent Labs    10/15/20 0420  RETICCTPCT 0.7    Sepsis Labs: No results for input(s): PROCALCITON, LATICACIDVEN in the last 168 hours.   No results found for this or any previous visit (from the past 240 hour(s)).   Radiology  Studies: No results found.  Scheduled Meds:  sodium chloride   Intravenous Once   sodium chloride   Intravenous Once   budesonide (PULMICORT) nebulizer solution  0.25 mg Nebulization BID   diclofenac Sodium  2 g Topical TID AC & HS   furosemide  40 mg Oral Daily   guaiFENesin  600 mg Oral BID   insulin aspart  0-20 Units Subcutaneous TID WC   insulin aspart  0-5 Units Subcutaneous QHS   insulin aspart  3 Units Subcutaneous TID WC   insulin glargine  20 Units Subcutaneous QHS   ipratropium-albuterol  3 mL Nebulization BID   linaclotide  145 mcg Oral QAC breakfast   metoprolol tartrate  50 mg Oral q morning   And   metoprolol tartrate  25 mg Oral QPM   montelukast  10 mg Oral QHS   olopatadine  1 drop Both Eyes BID   predniSONE  30 mg Oral Q breakfast   pregabalin  100 mg Oral QHS   sertraline  100 mg Oral QHS   sodium chloride flush  3 mL Intravenous Q12H   traZODone  100 mg Oral QHS     LOS: 11 days   Time spent: 42mns  Mckenzie Bove C Jannis Atkins, DO [facility]   If 7PM-7AM, please contact night-coverage [email or web address]  10/16/2020, 8:01 AM

## 2020-10-16 NOTE — Plan of Care (Signed)

## 2020-10-17 DIAGNOSIS — I5032 Chronic diastolic (congestive) heart failure: Secondary | ICD-10-CM | POA: Diagnosis not present

## 2020-10-17 DIAGNOSIS — J189 Pneumonia, unspecified organism: Secondary | ICD-10-CM | POA: Diagnosis not present

## 2020-10-17 DIAGNOSIS — C9310 Chronic myelomonocytic leukemia not having achieved remission: Secondary | ICD-10-CM | POA: Diagnosis not present

## 2020-10-17 DIAGNOSIS — D649 Anemia, unspecified: Secondary | ICD-10-CM | POA: Diagnosis not present

## 2020-10-17 LAB — GLUCOSE, CAPILLARY
Glucose-Capillary: 57 mg/dL — ABNORMAL LOW (ref 70–99)
Glucose-Capillary: 58 mg/dL — ABNORMAL LOW (ref 70–99)
Glucose-Capillary: 71 mg/dL (ref 70–99)
Glucose-Capillary: 142 mg/dL — ABNORMAL HIGH (ref 70–99)
Glucose-Capillary: 214 mg/dL — ABNORMAL HIGH (ref 70–99)
Glucose-Capillary: 299 mg/dL — ABNORMAL HIGH (ref 70–99)

## 2020-10-17 LAB — CBC
HCT: 23.9 % — ABNORMAL LOW (ref 36.0–46.0)
Hemoglobin: 7.7 g/dL — ABNORMAL LOW (ref 12.0–15.0)
MCH: 30.3 pg (ref 26.0–34.0)
MCHC: 32.2 g/dL (ref 30.0–36.0)
MCV: 94.1 fL (ref 80.0–100.0)
Platelets: 15 10*3/uL — CL (ref 150–400)
RBC: 2.54 MIL/uL — ABNORMAL LOW (ref 3.87–5.11)
RDW: 15.8 % — ABNORMAL HIGH (ref 11.5–15.5)
WBC: 2 10*3/uL — ABNORMAL LOW (ref 4.0–10.5)
nRBC: 9.8 % — ABNORMAL HIGH (ref 0.0–0.2)

## 2020-10-17 LAB — BASIC METABOLIC PANEL
Anion gap: 6 (ref 5–15)
BUN: 22 mg/dL (ref 8–23)
CO2: 30 mmol/L (ref 22–32)
Calcium: 9.4 mg/dL (ref 8.9–10.3)
Chloride: 103 mmol/L (ref 98–111)
Creatinine, Ser: 0.55 mg/dL (ref 0.44–1.00)
GFR, Estimated: >60 mL/min (ref >60)
Glucose, Bld: 79 mg/dL (ref 70–99)
Potassium: 4.6 mmol/L (ref 3.5–5.1)
Sodium: 139 mmol/L (ref 135–145)

## 2020-10-17 NOTE — Progress Notes (Signed)
PROGRESS NOTE    Kathy Irwin  QIW:[id number] DOB: 1950/05/13 DOA: 10/04/2020 PCP: Kerin Perna, NP    Brief Narrative:  70 year old female with history of CMML, chronic ITP, diastolic heart failure, diabetes, hypertension, presents to the hospital with complaints of shortness of breath.  She was noted to be getting dizzy and breaking out in a sweat when she was standing up.  Hemoglobin noted to be 5.  She was also significantly thrombocytopenic.  CTA chest negative for PE.  She was transfused PRBC and oncology was consulted.  Assessment & Plan:   Active Problems:   Type 2 diabetes mellitus with complication, without long-term current use of insulin (HCC)   HTN (hypertension)   Obesity (BMI 30-39.9)   Chronic diastolic CHF (congestive heart failure) (HCC)   HCAP (healthcare-associated pneumonia)   Hyperbilirubinemia   Idiopathic thrombocytopenia (HCC)   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HCC)   Symptomatic anemia   Symptomatic anemia/pancytopenia, POA CMML -Likely related to chronic myelomonocytic leukemia and recent chemotherapy - defer to Oncology -Hemoglobin of 5.1 on admission -  -She has received a total of 6 units prbc thus far    Component Value Date/Time   HGB 7.7 (L) 10/17/2020 0448   HGB 6.0 (LL) 09/28/2020 1326   HGB 11.2 12/30/2019 1159   HCT 23.9 (L) 10/17/2020 0448   HCT 20.0 (L) 10/06/2020 0423  -She does not have any sources of bleeding at this time -likely secondary to CMML per discussion with oncology -continue to monitor  SIRS without source, sepsis ruled out -Possibly related to anemia  -Hemodynamics appear to be stabilizing -Blood cultures with no growth -sepsis ruled out at this time  Acute on chronic respiratory failure with hypoxia -She is chronically on 2 to 3 L of oxygen -CT chest was negative for pulmonary embolus -CT did comment on possible interstitial lung disease versus pneumonia -She has completed antibiotic  course -Currently on prednisone taper -Continue bronchodilators to treat any element of pulm fibrosis -Overall respiratory status improving  Thrombocytopenia -Likely secondary to above CMML versus chemotherapy, she also has a component of ITP -She received Nplate (Romiplostim) 4/17, repeated 6/28 Lab Results  Component Value Date   PLT 15 (LL) 10/17/2020   Coagulopathy -Noted to have elevated INR - previously received vitamin K -INR trending down appropriately Lab Results  Component Value Date   INR 1.2 10/11/2020   INR 1.3 (H) 10/08/2020   INR 1.5 (H) 10/07/2020   Chronic diastolic congestive heart failure -currently appears to be euvolemic -continue home dose of lasix -continue to monitor volume status  Diabetes mellitus type 2, well controlled -Hold home dose of metformin -Continue on SSI -A1c from 4/25 noted to be 6 -blood sugars significantly elevated due to steroids -anticipate this should trend down as steroids are tapered -continue on lantus  AKI -likely related to IV diuretics -treated with limited IV fluids -now resolved  Hypertension -Continue metoprolol  Splenomegaly -Noted on CT scan, felt to be related to CMML  Generalized weakness -Seen by physical therapy with recommendations for skilled nursing facility -CSW is working on placement  Right hip pain -Patient has a history of right intertrochanteric/subtrochanteric femur fracture in 04/2020 status post intramedullary nail by Dr. Doreatha Martin -She has had many issues since surgery including infection and prolonged antibiotics -She has had continued pain since her fracture -Since she is 5 months out from her fracture, orthopedics has ordered CT of her hip for further evaluation -appreciate their insight and recommendations  DVT prophylaxis: SCDs  Start: 10/05/20 0719 given coagulopathy as above  Code Status: Full code Family Communication: Discussed with patient Disposition Plan: Status is:  Inpatient  Remains inpatient appropriate because:Ongoing diagnostic testing needed not appropriate for outpatient work up, IV treatments appropriate due to intensity of illness or inability to take PO, and Inpatient level of care appropriate due to severity of illness  Dispo: The patient is from: Home              Anticipated d/c is to: SNF              Patient currently is not medically stable to d/c.   Difficult to place patient No  Consultants:  Oncology, orthopedic surgery  Antimicrobials:  Ceftriaxone 6/21 >6/25 Azithromycin 6/21 >6/25   Subjective: No acute issues or events overnight, patient reports poor sleep again overnight - somnolent but easily arousable.   Objective: Vitals:   10/17/20 0817 10/17/20 1127 10/17/20 1259 10/17/20 1302  BP:  (!) 114/53 (!) 104/56 (!) 104/56  Pulse:  90 90 91  Resp:  19    Temp:  98.5 F (36.9 C)    TempSrc:  Oral    SpO2: 95% 100%  97%  Weight:      Height:        Intake/Output Summary (Last 24 hours) at 10/17/2020 1517 Last data filed at 10/17/2020 1300 Gross per 24 hour  Intake 1320 ml  Output 2425 ml  Net -1105 ml   Filed Weights   10/12/20 2100 10/13/20 0618 10/14/20 0438  Weight: 103.4 kg 103 kg 102.3 kg    Examination:  General exam: Alert, awake, oriented x 3 Respiratory system: fine crackles at bases. Respiratory effort normal. Cardiovascular system:RRR. No murmurs, rubs, gallops. Gastrointestinal system: Abdomen is nondistended, soft and nontender. No organomegaly or masses felt. Normal bowel sounds heard. Central nervous system: Alert and oriented. No focal neurological deficits. Extremities: No C/C/E, +pedal pulses Skin: No rashes, lesions or ulcers  Data Reviewed: I have personally reviewed following labs and imaging studies  CBC: Recent Labs  Lab 10/13/20 0523 10/14/20 0304 10/14/20 1830 10/15/20 0420 10/15/20 2227 10/16/20 0416 10/17/20 0448  WBC 2.0* 2.2*  --  1.7*  1.8*  --  2.2* 2.0*   NEUTROABS 0.6*  --   --  0.5*  --   --   --   HGB 7.8* 7.0* 9.2* 7.4*  7.3* 8.8* 8.0* 7.7*  HCT 23.9* 21.3* 27.9* 22.4*  22.4* 26.3* 24.2* 23.9*  MCV 95.2 93.8  --  92.9  94.1  --  93.4 94.1  PLT 21* 20*  --  13*  15*  --  14* 15*   Basic Metabolic Panel: Recent Labs  Lab 10/13/20 0523 10/14/20 0304 10/15/20 0420 10/16/20 0416 10/17/20 0448  NA 136 136 137 133* 139  K 4.0 4.3 4.4 4.3 4.6  CL 105 106 104 100 103  CO2 _0 GLUCOSE 222* 105* 196* 183* 79  BUN _1 CREATININE 0.52 0.49 0.60 0.54 0.55  CALCIUM 9.2 9.2 8.7* 9.1 9.4   GFR: Estimated Creatinine Clearance: 76.1 mL/min (by C-G formula based on SCr of 0.55 mg/dL). Liver Function Tests: No results for input(s): AST, ALT, ALKPHOS, BILITOT, PROT, ALBUMIN in the last 168 hours.  No results for input(s): LIPASE, AMYLASE in the last 168 hours. No results for input(s): AMMONIA in the last 168 hours. Coagulation Profile: Recent Labs  Lab 10/11/20 0036  INR 1.2   Cardiac  Enzymes: No results for input(s): CKTOTAL, CKMB, CKMBINDEX, TROPONINI in the last 168 hours. BNP (last 3 results) No results for input(s): PROBNP in the last 8760 hours. HbA1C: No results for input(s): HGBA1C in the last 72 hours. CBG: Recent Labs  Lab 10/16/20 2127 10/17/20 0639 10/17/20 0710 10/17/20 0739 10/17/20 1207  GLUCAP 287* 57* 58* 71 142*   Lipid Profile: No results for input(s): CHOL, HDL, LDLCALC, TRIG, CHOLHDL, LDLDIRECT in the last 72 hours. Thyroid Function Tests: No results for input(s): TSH, T4TOTAL, FREET4, T3FREE, THYROIDAB in the last 72 hours. Anemia Panel: Recent Labs    10/15/20 0420  RETICCTPCT 0.7    Sepsis Labs: No results for input(s): PROCALCITON, LATICACIDVEN in the last 168 hours.   No results found for this or any previous visit (from the past 240 hour(s)).   Radiology Studies: No results found.  Scheduled Meds:  sodium chloride   Intravenous Once   sodium chloride    Intravenous Once   budesonide (PULMICORT) nebulizer solution  0.25 mg Nebulization BID   diclofenac Sodium  2 g Topical TID AC & HS   furosemide  40 mg Oral Daily   guaiFENesin  600 mg Oral BID   insulin aspart  0-20 Units Subcutaneous TID WC   insulin aspart  0-5 Units Subcutaneous QHS   insulin aspart  3 Units Subcutaneous TID WC   insulin glargine  20 Units Subcutaneous QHS   ipratropium-albuterol  3 mL Nebulization BID   linaclotide  145 mcg Oral QAC breakfast   metoprolol tartrate  50 mg Oral q morning   And   metoprolol tartrate  25 mg Oral QPM   montelukast  10 mg Oral QHS   olopatadine  1 drop Both Eyes BID   predniSONE  30 mg Oral Q breakfast   pregabalin  100 mg Oral QHS   sertraline  100 mg Oral QHS   sodium chloride flush  3 mL Intravenous Q12H   traZODone  100 mg Oral QHS     LOS: 12 days   Time spent: 60mns  Kori Colin C Eli Pattillo, DO [facility]   If 7PM-7AM, please contact night-coverage [email or web address]  10/17/2020, 3:17 PM

## 2020-10-18 DIAGNOSIS — J189 Pneumonia, unspecified organism: Secondary | ICD-10-CM | POA: Diagnosis not present

## 2020-10-18 DIAGNOSIS — R6511 Systemic inflammatory response syndrome (SIRS) of non-infectious origin with acute organ dysfunction: Secondary | ICD-10-CM

## 2020-10-18 DIAGNOSIS — C9310 Chronic myelomonocytic leukemia not having achieved remission: Secondary | ICD-10-CM | POA: Diagnosis not present

## 2020-10-18 DIAGNOSIS — D649 Anemia, unspecified: Secondary | ICD-10-CM | POA: Diagnosis not present

## 2020-10-18 DIAGNOSIS — I5032 Chronic diastolic (congestive) heart failure: Secondary | ICD-10-CM | POA: Diagnosis not present

## 2020-10-18 DIAGNOSIS — C9210 Chronic myeloid leukemia, BCR/ABL-positive, not having achieved remission: Secondary | ICD-10-CM

## 2020-10-18 LAB — BASIC METABOLIC PANEL
Anion gap: 4 — ABNORMAL LOW (ref 5–15)
BUN: 27 mg/dL — ABNORMAL HIGH (ref 8–23)
CO2: 28 mmol/L (ref 22–32)
Calcium: 9.2 mg/dL (ref 8.9–10.3)
Chloride: 102 mmol/L (ref 98–111)
Creatinine, Ser: 0.63 mg/dL (ref 0.44–1.00)
GFR, Estimated: >60 mL/min (ref >60)
Glucose, Bld: 270 mg/dL — ABNORMAL HIGH (ref 70–99)
Potassium: 4.5 mmol/L (ref 3.5–5.1)
Sodium: 134 mmol/L — ABNORMAL LOW (ref 135–145)

## 2020-10-18 LAB — CBC
HCT: 23.3 % — ABNORMAL LOW (ref 36.0–46.0)
Hemoglobin: 7.6 g/dL — ABNORMAL LOW (ref 12.0–15.0)
MCH: 30.8 pg (ref 26.0–34.0)
MCHC: 32.6 g/dL (ref 30.0–36.0)
MCV: 94.3 fL (ref 80.0–100.0)
Platelets: 19 10*3/uL — CL (ref 150–400)
RBC: 2.47 MIL/uL — ABNORMAL LOW (ref 3.87–5.11)
RDW: 15.7 % — ABNORMAL HIGH (ref 11.5–15.5)
WBC: 2 10*3/uL — ABNORMAL LOW (ref 4.0–10.5)
nRBC: 11.9 % — ABNORMAL HIGH (ref 0.0–0.2)

## 2020-10-18 LAB — GLUCOSE, CAPILLARY
Glucose-Capillary: 234 mg/dL — ABNORMAL HIGH (ref 70–99)
Glucose-Capillary: 152 mg/dL — ABNORMAL HIGH (ref 70–99)
Glucose-Capillary: 178 mg/dL — ABNORMAL HIGH (ref 70–99)
Glucose-Capillary: 247 mg/dL — ABNORMAL HIGH (ref 70–99)

## 2020-10-18 NOTE — Progress Notes (Signed)
IP PROGRESS NOTE  Subjective:  No new complaint, no dyspnea.  She reports that she is ambulating.  Objective: Vital signs in last 24 hours: Blood pressure (!) 103/56, pulse 66, temperature 98.6 F (37 C), resp. rate 20, height _0  (1.626 m), weight 225 lb 9.6 oz (102.3 kg), SpO2 100 %.  Intake/Output from previous day: 07/03 0701 - 07/04 0700 In: 1440 [P.O.:1440] Out: 975 [Urine:975]  Physical Exam:  HEENT: No thrush or bleeding  Abdomen: Nontender,?  Spleen tip in the left mid lateral abdomen Extremities: No leg edema   Lab Results: Recent Labs    10/17/20 0448 10/18/20 0414  WBC 2.0* 2.0*  HGB 7.7* 7.6*  HCT 23.9* 23.3*  PLT 15* 19*    BMET Recent Labs    10/17/20 0448 10/18/20 0414  NA 139 134*  K 4.6 4.5  CL 103 102  CO2 30 28  GLUCOSE 79 270*  BUN 22 27*  CREATININE 0.55 0.63  CALCIUM 9.4 9.2    No results found for: CEA1  Studies/Results: No results found.  Medications: I have reviewed the patient's current medications.  Assessment/Plan: 1. CMML, Currently at D26 s/p cycle 2 5-azacytidine 2.  Anemia 3.  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  SIRS 5.  Chronic respiratory failure with hypoxia and interstitial lung disease,?  Healthcare associated pneumonia 6.  Coagulopathy 7.  Diastolic CHF 8.  Hypertension 9.  Diabetes mellitus 10.  Anxiety 11.  Hyperbilirubinemia 12.  Splenomegaly secondary to #1   Ms. Epler appears unchanged.  She has persistent pancytopenia, now at day 29 following cycle two 5-azacytidine given for treatment of CMML.  There is no indication for platelet or red cell transfusion today.  I am concerned the pancytopenia has not responded to 5-azacytidine.  Dr. Irene Limbo will return tomorrow.  We will need to decide on continuing 5-azacytidine versus switching to a trial of a different systemic therapy.  The erythropoietin level was appropriately elevated last week, so I doubt she would respond  to a trial of erythropoietin. She continues to wait on skilled nursing facility placement. Recommendations: Physical therapy, skilled nursing facility placement Dr. Irene Limbo will return tomorrow to decide on proceeding with 5-azacytidine and a restaging bone marrow biopsy No indication for Red cell or platelet transfusion today   LOS: 13 days   Betsy Coder, MD   10/18/2020, 7:42 AM

## 2020-10-18 NOTE — Progress Notes (Signed)
PROGRESS NOTE    Kathy Irwin  RKY:[id number] DOB: 12-29-1950 DOA: 10/04/2020 PCP: Kerin Perna, NP    Brief Narrative:  70 year old female with history of CMML, chronic ITP, diastolic heart failure, diabetes, hypertension, presents to the hospital with complaints of shortness of breath.  She was noted to be getting dizzy and breaking out in a sweat when she was standing up.  Hemoglobin noted to be 5.  She was also significantly thrombocytopenic.  CTA chest negative for PE.  She was transfused PRBC and oncology was consulted.  Assessment & Plan:   Active Problems:   Type 2 diabetes mellitus with complication, without long-term current use of insulin (HCC)   HTN (hypertension)   Obesity (BMI 30-39.9)   Chronic diastolic CHF (congestive heart failure) (HCC)   HCAP (healthcare-associated pneumonia)   Hyperbilirubinemia   Idiopathic thrombocytopenia (HCC)   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HCC)   Symptomatic anemia   Symptomatic anemia/pancytopenia, POA CMML -Likely related to chronic myelomonocytic leukemia and recent chemotherapy - defer to Oncology -Hemoglobin of 5.1 on admission -  -She has received a total of 6 units prbc thus far -labs somewhat stabilizing over the past 48 hours but certainly not improving    Component Value Date/Time   HGB 7.6 (L) 10/18/2020 0414   HGB 6.0 (LL) 09/28/2020 1326   HGB 11.2 12/30/2019 1159   HCT 23.3 (L) 10/18/2020 0414   HCT 20.0 (L) 10/06/2020 0423   SIRS without source, sepsis ruled out -Possibly related to anemia  -Hemodynamics appear to be stabilizing -Blood cultures with no growth -sepsis ruled out at this time  Acute on chronic respiratory failure with hypoxia -She is chronically on 2 to 3 L of oxygen -CT chest was negative for pulmonary embolus -CT did comment on possible interstitial lung disease versus pneumonia -She has completed antibiotic course -Currently on prednisone taper -Continue bronchodilators to  treat any element of pulm fibrosis -Overall respiratory status improving  Thrombocytopenia -Likely secondary to above CMML versus chemotherapy, she also has a component of ITP -She received Nplate (Romiplostim) 3/15, repeated 6/28 Lab Results  Component Value Date   PLT 19 (LL) 10/18/2020   Coagulopathy -Noted to have elevated INR - previously received vitamin K -INR trending down appropriately Lab Results  Component Value Date   INR 1.2 10/11/2020   INR 1.3 (H) 10/08/2020   INR 1.5 (H) 10/07/2020   Chronic diastolic congestive heart failure -currently appears to be euvolemic -continue home dose of lasix -continue to monitor volume status  Diabetes mellitus type 2, well controlled -Hold home dose of metformin -Continue on SSI -A1c from 4/25 noted to be 6 -blood sugars significantly elevated due to steroids -anticipate this should trend down as steroids are tapered -continue on lantus  AKI -likely related to IV diuretics -treated with limited IV fluids -now resolved  Hypertension -Continue metoprolol  Splenomegaly -Noted on CT scan, felt to be related to CMML  Generalized weakness -Seen by physical therapy with recommendations for skilled nursing facility -CSW is working on placement  Right hip pain -Patient has a history of right intertrochanteric/subtrochanteric femur fracture in 04/2020 status post intramedullary nail by Dr. Doreatha Martin -She has had many issues since surgery including infection and prolonged antibiotics -She has had continued pain since her fracture -Since she is 5 months out from her fracture, orthopedics has ordered CT of her hip for further evaluation -appreciate their insight and recommendations  DVT prophylaxis: SCDs Start: 10/05/20 0719 given coagulopathy as above  Code Status:  Full code Family Communication: Discussed with patient Disposition Plan: Status is: Inpatient  Remains inpatient appropriate because:Ongoing diagnostic testing  needed not appropriate for outpatient work up, IV treatments appropriate due to intensity of illness or inability to take PO, and Inpatient level of care appropriate due to severity of illness  Dispo: The patient is from: Home              Anticipated d/c is to: SNF              Patient currently is not medically stable to d/c.   Difficult to place patient No  Consultants:  Oncology, orthopedic surgery  Antimicrobials:  Ceftriaxone 6/21 >6/25 Azithromycin 6/21 >6/25   Subjective: No acute issues or events overnight, patient reports poor sleep again overnight - somnolent but easily arousable.   Objective: Vitals:   10/17/20 1918 10/17/20 2045 10/17/20 2054 10/18/20 0511  BP: (!) 100/53 (!) 101/50  (!) 103/56  Pulse: 93 94  66  Resp: _0 Temp: 98.4 F (36.9 C) 99.6 F (37.6 C)  98.6 F (37 C)  TempSrc: Oral Oral    SpO2: 95% 95% 95% 100%  Weight:      Height:        Intake/Output Summary (Last 24 hours) at 10/18/2020 0833 Last data filed at 10/18/2020 0600 Gross per 24 hour  Intake 1080 ml  Output 975 ml  Net 105 ml    Filed Weights   10/12/20 2100 10/13/20 0618 10/14/20 0438  Weight: 103.4 kg 103 kg 102.3 kg    Examination:  General exam: Alert, awake, oriented x 3 Respiratory system: fine crackles at bases. Respiratory effort normal. Cardiovascular system:RRR. No murmurs, rubs, gallops. Gastrointestinal system: Abdomen is nondistended, soft and nontender. No organomegaly or masses felt. Normal bowel sounds heard. Central nervous system: Alert and oriented. No focal neurological deficits. Extremities: No C/C/E, +pedal pulses Skin: No rashes, lesions or ulcers  Data Reviewed: I have personally reviewed following labs and imaging studies  CBC: Recent Labs  Lab 10/13/20 0523 10/14/20 0304 10/14/20 1830 10/15/20 0420 10/15/20 2227 10/16/20 0416 10/17/20 0448 10/18/20 0414  WBC 2.0* 2.2*  --  1.7*  1.8*  --  2.2* 2.0* 2.0*  NEUTROABS 0.6*  --   --   0.5*  --   --   --   --   HGB 7.8* 7.0*   < > 7.4*  7.3* 8.8* 8.0* 7.7* 7.6*  HCT 23.9* 21.3*   < > 22.4*  22.4* 26.3* 24.2* 23.9* 23.3*  MCV 95.2 93.8  --  92.9  94.1  --  93.4 94.1 94.3  PLT 21* 20*  --  13*  15*  --  14* 15* 19*   < > = values in this interval not displayed.    Basic Metabolic Panel: Recent Labs  Lab 10/14/20 0304 10/15/20 0420 10/16/20 0416 10/17/20 0448 10/18/20 0414  NA 136 137 133* 139 134*  K 4.3 4.4 4.3 4.6 4.5  CL 106 104 100 103 102  CO2 _1 GLUCOSE 105* 196* 183* 79 270*  BUN _2 27*  CREATININE 0.49 0.60 0.54 0.55 0.63  CALCIUM 9.2 8.7* 9.1 9.4 9.2    GFR: Estimated Creatinine Clearance: 76.1 mL/min (by C-G formula based on SCr of 0.63 mg/dL). Liver Function Tests: No results for input(s): AST, ALT, ALKPHOS, BILITOT, PROT, ALBUMIN in the last 168 hours.  No results for input(s): LIPASE, AMYLASE in the last  168 hours. No results for input(s): AMMONIA in the last 168 hours. Coagulation Profile: No results for input(s): INR, PROTIME in the last 168 hours.  Cardiac Enzymes: No results for input(s): CKTOTAL, CKMB, CKMBINDEX, TROPONINI in the last 168 hours. BNP (last 3 results) No results for input(s): PROBNP in the last 8760 hours. HbA1C: No results for input(s): HGBA1C in the last 72 hours. CBG: Recent Labs  Lab 10/17/20 0739 10/17/20 1207 10/17/20 1712 10/17/20 2047 10/18/20 0637  GLUCAP 71 142* 214* 299* 234*    Lipid Profile: No results for input(s): CHOL, HDL, LDLCALC, TRIG, CHOLHDL, LDLDIRECT in the last 72 hours. Thyroid Function Tests: No results for input(s): TSH, T4TOTAL, FREET4, T3FREE, THYROIDAB in the last 72 hours. Anemia Panel: No results for input(s): VITAMINB12, FOLATE, FERRITIN, TIBC, IRON, RETICCTPCT in the last 72 hours.   Sepsis Labs: No results for input(s): PROCALCITON, LATICACIDVEN in the last 168 hours.   No results found for this or any previous visit (from the past 240  hour(s)).   Radiology Studies: No results found.  Scheduled Meds:  sodium chloride   Intravenous Once   sodium chloride   Intravenous Once   budesonide (PULMICORT) nebulizer solution  0.25 mg Nebulization BID   diclofenac Sodium  2 g Topical TID AC & HS   furosemide  40 mg Oral Daily   guaiFENesin  600 mg Oral BID   insulin aspart  0-20 Units Subcutaneous TID WC   insulin aspart  0-5 Units Subcutaneous QHS   insulin aspart  3 Units Subcutaneous TID WC   insulin glargine  20 Units Subcutaneous QHS   ipratropium-albuterol  3 mL Nebulization BID   linaclotide  145 mcg Oral QAC breakfast   metoprolol tartrate  50 mg Oral q morning   And   metoprolol tartrate  25 mg Oral QPM   montelukast  10 mg Oral QHS   olopatadine  1 drop Both Eyes BID   predniSONE  30 mg Oral Q breakfast   pregabalin  100 mg Oral QHS   sertraline  100 mg Oral QHS   sodium chloride flush  3 mL Intravenous Q12H   traZODone  100 mg Oral QHS     LOS: 13 days   Time spent: 46mns  Milarose Savich C Yamato Kopf, DO [facility]   If 7PM-7AM, please contact night-coverage [email or web address]  10/18/2020, 8:33 AM

## 2020-10-19 ENCOUNTER — Inpatient Hospital Stay: Payer: 59 | Admitting: Hematology

## 2020-10-19 ENCOUNTER — Ambulatory Visit: Payer: 59 | Admitting: Hematology

## 2020-10-19 ENCOUNTER — Ambulatory Visit: Payer: 59

## 2020-10-19 ENCOUNTER — Inpatient Hospital Stay: Payer: 59

## 2020-10-19 ENCOUNTER — Other Ambulatory Visit: Payer: 59

## 2020-10-19 DIAGNOSIS — J189 Pneumonia, unspecified organism: Secondary | ICD-10-CM | POA: Diagnosis not present

## 2020-10-19 DIAGNOSIS — D649 Anemia, unspecified: Secondary | ICD-10-CM | POA: Diagnosis not present

## 2020-10-19 DIAGNOSIS — I5032 Chronic diastolic (congestive) heart failure: Secondary | ICD-10-CM | POA: Diagnosis not present

## 2020-10-19 DIAGNOSIS — C9310 Chronic myelomonocytic leukemia not having achieved remission: Secondary | ICD-10-CM | POA: Diagnosis not present

## 2020-10-19 DIAGNOSIS — D693 Immune thrombocytopenic purpura: Secondary | ICD-10-CM | POA: Diagnosis not present

## 2020-10-19 LAB — BASIC METABOLIC PANEL
Anion gap: 3 — ABNORMAL LOW (ref 5–15)
BUN: 29 mg/dL — ABNORMAL HIGH (ref 8–23)
CO2: 30 mmol/L (ref 22–32)
Calcium: 9.3 mg/dL (ref 8.9–10.3)
Chloride: 103 mmol/L (ref 98–111)
Creatinine, Ser: 0.6 mg/dL (ref 0.44–1.00)
GFR, Estimated: >60 mL/min (ref >60)
Glucose, Bld: 182 mg/dL — ABNORMAL HIGH (ref 70–99)
Potassium: 4.7 mmol/L (ref 3.5–5.1)
Sodium: 136 mmol/L (ref 135–145)

## 2020-10-19 LAB — GLUCOSE, CAPILLARY
Glucose-Capillary: 89 mg/dL (ref 70–99)
Glucose-Capillary: 120 mg/dL — ABNORMAL HIGH (ref 70–99)
Glucose-Capillary: 266 mg/dL — ABNORMAL HIGH (ref 70–99)
Glucose-Capillary: 279 mg/dL — ABNORMAL HIGH (ref 70–99)

## 2020-10-19 LAB — CBC
HCT: 20.7 % — ABNORMAL LOW (ref 36.0–46.0)
Hemoglobin: 6.8 g/dL — CL (ref 12.0–15.0)
MCH: 31.1 pg (ref 26.0–34.0)
MCHC: 32.9 g/dL (ref 30.0–36.0)
MCV: 94.5 fL (ref 80.0–100.0)
Platelets: 18 10*3/uL — CL (ref 150–400)
RBC: 2.19 MIL/uL — ABNORMAL LOW (ref 3.87–5.11)
RDW: 15.9 % — ABNORMAL HIGH (ref 11.5–15.5)
WBC: 2 10*3/uL — ABNORMAL LOW (ref 4.0–10.5)
nRBC: 12.3 % — ABNORMAL HIGH (ref 0.0–0.2)

## 2020-10-19 LAB — PREPARE RBC (CROSSMATCH)

## 2020-10-19 LAB — HEMOGLOBIN AND HEMATOCRIT, BLOOD
HCT: 20 % — ABNORMAL LOW (ref 36.0–46.0)
Hemoglobin: 6.5 g/dL — CL (ref 12.0–15.0)

## 2020-10-19 MED ORDER — SODIUM CHLORIDE 0.9% IV SOLUTION: Freq: Once | INTRAVENOUS | Status: DC

## 2020-10-19 MED ORDER — ROMIPLOSTIM INJECTION 500 MCG
7.0000 ug/kg | Freq: Once | SUBCUTANEOUS | Status: AC
  Administered 2020-10-19: 715 ug via SUBCUTANEOUS
  Filled 2020-10-19: qty 1.43

## 2020-10-19 NOTE — Progress Notes (Signed)
OT Cancellation Note  Patient Details Name: Kathy Irwin MRN: [id number] DOB: 01-12-1951   Cancelled Treatment:    Reason Eval/Treat Not Completed: Medical issues which prohibited therapy: Pt with Hgb of 6.5. Per chart, pt with blood hanging now but no new Hgb count available. Will hold OT per protocol and continue efforts.   Julien Girt 10/19/2020, 1:14 PM

## 2020-10-19 NOTE — Progress Notes (Signed)
   10/19/20 8889  Provider Notification  Provider Name/Title Dr. Nevada Crane  Date Provider Notified 10/19/20  Time Provider Notified [phone]  Notification Type Page  Notification Reason Change in status  Test performed and critical result CBC  Date Critical Result Received 10/19/20  Provider response See new orders  Date of Provider Response 10/19/20  Time of Provider Response 0559   Patient's B/P is 92/47.  Critical hemoglobin of 6.8 received (down from 7.6 on 7/4).  No obvious signs of bleeding noted during my shift.  Dr. Nevada Crane made aware.  Order for repeat STAT H & H received.  Will continue to monitor patient.  Earleen Reaper RN

## 2020-10-19 NOTE — Progress Notes (Signed)
PROGRESS NOTE    Kathy Irwin  HAL:[id number] DOB: [birth year] DOA: 10/04/2020 PCP: Kerin Perna, NP    Brief Narrative:  70 year old female with history of CMML, chronic ITP, diastolic heart failure, diabetes, hypertension, presents to the hospital with complaints of shortness of breath.  She was noted to be getting dizzy and breaking out in a sweat when she was standing up.  Hemoglobin noted to be 5.  She was also significantly thrombocytopenic.  CTA chest negative for PE.  She was transfused PRBC and oncology was consulted.  Assessment & Plan:   Active Problems:   Type 2 diabetes mellitus with complication, without long-term current use of insulin (HCC)   HTN (hypertension)   Obesity (BMI 30-39.9)   Chronic diastolic CHF (congestive heart failure) (HCC)   HCAP (healthcare-associated pneumonia)   Hyperbilirubinemia   Idiopathic thrombocytopenia (HCC)   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HCC)   Symptomatic anemia  Symptomatic anemia/pancytopenia, POA, ongoing CMML -Likely related to chronic myelomonocytic leukemia and recent chemotherapy - defer to Oncology -Oncology plans to reduce patient's Nplate -Hemoglobin of 5.1 on admission -  -She has received a total of 7 units prbc thus far -labs continue to downtrend over the past few days concerning for worsening disease    Component Value Date/Time   HGB 6.5 (LL) 10/19/2020 0619   HGB 6.0 (LL) 09/28/2020 1326   HGB 11.2 12/30/2019 1159   HCT 20.0 (L) 10/19/2020 0619   HCT 20.0 (L) 10/06/2020 0423   SIRS without source, sepsis ruled out -Possibly related to anemia  -Hemodynamics appear to be stabilizing -Blood cultures with no growth -sepsis ruled out at this time  Acute on chronic respiratory failure with hypoxia -She is chronically on 2 to 3 L of oxygen -CT chest was negative for pulmonary embolus -CT did comment on possible interstitial lung disease versus pneumonia -She has completed antibiotic  course -Currently on prednisone taper -Continue bronchodilators to treat any element of pulm fibrosis -Overall respiratory status improving  Thrombocytopenia -Likely secondary to above CMML versus chemotherapy, she also has a component of ITP -She received Nplate (Romiplostim) 7/35, repeated 6/28 Lab Results  Component Value Date   PLT 18 (LL) 10/19/2020   Coagulopathy -Noted to have elevated INR - previously received vitamin K -INR trending down appropriately Lab Results  Component Value Date   INR 1.2 10/11/2020   INR 1.3 (H) 10/08/2020   INR 1.5 (H) 10/07/2020   Chronic diastolic congestive heart failure -currently appears to be euvolemic -continue home dose of lasix -continue to monitor volume status  Diabetes mellitus type 2, well controlled -Hold home dose of metformin -Continue on SSI -A1c from 4/25 noted to be 6 -blood sugars significantly elevated due to steroids -anticipate this should trend down as steroids are tapered -continue on lantus  AKI -likely related to IV diuretics -treated with limited IV fluids -now resolved  Hypertension -Continue metoprolol  Splenomegaly -Noted on CT scan, felt to be related to CMML  Generalized weakness -Seen by physical therapy with recommendations for skilled nursing facility -CSW is working on placement  Right hip pain -Patient has a history of right intertrochanteric/subtrochanteric femur fracture in 04/2020 status post intramedullary nail by Dr. Doreatha Martin -She has had many issues since surgery including infection and prolonged antibiotics -She has had continued pain since her fracture -Since she is 5 months out from her fracture, orthopedics has ordered CT of her hip for further evaluation -appreciate their insight and recommendations  DVT prophylaxis: SCDs Start: 10/05/20 0719  given coagulopathy as above  Code Status: Full code Family Communication: Discussed with patient with family at bedside Disposition Plan:  Status is: Inpatient  Dispo: The patient is from: Home              Anticipated d/c is to: SNF              Patient currently is not medically stable to d/c.   Difficult to place patient Yes -patient has active Workmen's Comp. case ongoing which apparently prohibits her ability to transition to SNF with insurance as Per father's case is active.  She is not admitted for her right hip pain, but certainly her ambulatory dysfunction is related to her chronic hip pain from previous fall.  She is more unstable now secondary to acute issues with CMML, but again without previous hip fracture patient may be stable and reasonable for discharge home otherwise.  Consultants:  Oncology, orthopedic surgery  Antimicrobials:  Ceftriaxone 6/21 >6/25 Azithromycin 6/21 >6/25   Subjective: No acute issues or events overnight, patient reports poor sleep overnight, very somnolent this morning able to reorient and answer appropriately during our interview but very short discussion before patient falls back asleep.  She denies nausea vomiting diarrhea constipation headache fevers or chills..   Objective: Vitals:   10/18/20 2027 10/18/20 2031 10/19/20 0448 10/19/20 0452  BP: (!) 98/59  (!) 90/51 (!) 92/47  Pulse: 76  70 72  Resp: 20  19   Temp: 97.9 F (36.6 C)  98.3 F (36.8 C)   TempSrc: Oral  Oral   SpO2: 100% 99% 92% 93%  Weight:      Height:        Intake/Output Summary (Last 24 hours) at 10/19/2020 0811 Last data filed at 10/19/2020 0600 Gross per 24 hour  Intake 654 ml  Output 425 ml  Net 229 ml    Filed Weights   10/12/20 2100 10/13/20 0618 10/14/20 0438  Weight: 103.4 kg 103 kg 102.3 kg    Examination:  General exam: Somnolent but arousable, oriented x 3 Cardiovascular system:RRR. No murmurs, rubs, gallops. Gastrointestinal system: Abdomen is nondistended, soft and nontender. No organomegaly or masses felt. Normal bowel sounds heard. Central nervous system: Alert and oriented. No  focal neurological deficits. Extremities: No C/C/E, +pedal pulses Skin: No rashes, lesions or ulcers  Data Reviewed: I have personally reviewed following labs and imaging studies  CBC: Recent Labs  Lab 10/13/20 0523 10/14/20 0304 10/15/20 0420 10/15/20 2227 10/16/20 0416 10/17/20 0448 10/18/20 0414 10/19/20 0317 10/19/20 0619  WBC 2.0*   < > 1.7*  1.8*  --  2.2* 2.0* 2.0* 2.0*  --   NEUTROABS 0.6*  --  0.5*  --   --   --   --   --   --   HGB 7.8*   < > 7.4*  7.3*   < > 8.0* 7.7* 7.6* 6.8* 6.5*  HCT 23.9*   < > 22.4*  22.4*   < > 24.2* 23.9* 23.3* 20.7* 20.0*  MCV 95.2   < > 92.9  94.1  --  93.4 94.1 94.3 94.5  --   PLT 21*   < > 13*  15*  --  14* 15* 19* 18*  --    < > = values in this interval not displayed.    Basic Metabolic Panel: Recent Labs  Lab 10/15/20 0420 10/16/20 0416 10/17/20 0448 10/18/20 0414 10/19/20 0317  NA 137 133* 139 134* 136  K 4.4 4.3  4.6 4.5 4.7  CL 104 100 103 102 103  CO2 _0 GLUCOSE 196* 183* 79 270* 182*  BUN _1 27* 29*  CREATININE 0.60 0.54 0.55 0.63 0.60  CALCIUM 8.7* 9.1 9.4 9.2 9.3    GFR: Estimated Creatinine Clearance: 76.1 mL/min (by C-G formula based on SCr of 0.6 mg/dL). Liver Function Tests: No results for input(s): AST, ALT, ALKPHOS, BILITOT, PROT, ALBUMIN in the last 168 hours.  No results for input(s): LIPASE, AMYLASE in the last 168 hours. No results for input(s): AMMONIA in the last 168 hours. Coagulation Profile: No results for input(s): INR, PROTIME in the last 168 hours.  Cardiac Enzymes: No results for input(s): CKTOTAL, CKMB, CKMBINDEX, TROPONINI in the last 168 hours. BNP (last 3 results) No results for input(s): PROBNP in the last 8760 hours. HbA1C: No results for input(s): HGBA1C in the last 72 hours. CBG: Recent Labs  Lab 10/18/20 0637 10/18/20 1143 10/18/20 1646 10/18/20 2028 10/19/20 0646  GLUCAP 234* 152* 178* 247* 89    Lipid Profile: No results for input(s): CHOL,  HDL, LDLCALC, TRIG, CHOLHDL, LDLDIRECT in the last 72 hours. Thyroid Function Tests: No results for input(s): TSH, T4TOTAL, FREET4, T3FREE, THYROIDAB in the last 72 hours. Anemia Panel: No results for input(s): VITAMINB12, FOLATE, FERRITIN, TIBC, IRON, RETICCTPCT in the last 72 hours.   Sepsis Labs: No results for input(s): PROCALCITON, LATICACIDVEN in the last 168 hours.   No results found for this or any previous visit (from the past 240 hour(s)).   Radiology Studies: No results found.  Scheduled Meds:  sodium chloride   Intravenous Once   sodium chloride   Intravenous Once   sodium chloride   Intravenous Once   budesonide (PULMICORT) nebulizer solution  0.25 mg Nebulization BID   diclofenac Sodium  2 g Topical TID AC & HS   furosemide  40 mg Oral Daily   guaiFENesin  600 mg Oral BID   insulin aspart  0-20 Units Subcutaneous TID WC   insulin aspart  0-5 Units Subcutaneous QHS   insulin aspart  3 Units Subcutaneous TID WC   insulin glargine  20 Units Subcutaneous QHS   ipratropium-albuterol  3 mL Nebulization BID   linaclotide  145 mcg Oral QAC breakfast   metoprolol tartrate  50 mg Oral q morning   And   metoprolol tartrate  25 mg Oral QPM   montelukast  10 mg Oral QHS   olopatadine  1 drop Both Eyes BID   predniSONE  30 mg Oral Q breakfast   pregabalin  100 mg Oral QHS   sertraline  100 mg Oral QHS   sodium chloride flush  3 mL Intravenous Q12H   traZODone  100 mg Oral QHS     LOS: 14 days   Time spent: 45mns  Sonda Coppens C Ren Aspinall, DO [facility]   If 7PM-7AM, please contact night-coverage [email or web address]  10/19/2020, 8:11 AM

## 2020-10-19 NOTE — Progress Notes (Addendum)
HEMATOLOGY-ONCOLOGY PROGRESS NOTE  SUBJECTIVE: The patient has a unit of blood hanging.  No bleeding reported.  The patient is somewhat more somnolent today.  She opens her eyes and acknowledges me, but falls asleep quickly.  She offers no specific complaints to me today.  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[place])    08/16/2020 -  Chemotherapy    Patient is on Treatment Plan: MYELODYSPLASIA  AZACITIDINE SQ D1-5 Q28D          REVIEW OF SYSTEMS:   Constitutional: Denies fevers, chills Eyes: Denies blurriness of vision Ears, nose, mouth, throat, and face: Denies mucositis or sore throat Respiratory: Denies cough, dyspnea or wheezes Cardiovascular: Denies palpitation, chest discomfort Gastrointestinal:  Denies nausea, heartburn or change in bowel habits Skin: Denies abnormal skin rashes Lymphatics: Denies new lymphadenopathy or easy bruising Neurological:Denies numbness, tingling or new weaknesses Behavioral/Psych: Mood is stable, no new changes  Extremities: No lower extremity edema All other systems were reviewed with the patient and are negative.  I have reviewed the past medical history, past surgical history, social history and family history with the patient and they are unchanged from previous note.   PHYSICAL EXAMINATION: ECOG PERFORMANCE STATUS: 3 - Symptomatic, >50% confined to bed  Vitals:   10/19/20 0908 10/19/20 0957  BP: (!) 106/42 (!) 98/47  Pulse: 77 84  Resp: 19 16  Temp: 98 F (36.7 C) 97.9 F (36.6 C)  SpO2: 99% 92%   Filed Weights   10/12/20 2100 10/13/20 0618 10/14/20 0438  Weight: 103.4 kg 103 kg 102.3 kg    Intake/Output from previous day: 07/04 0701 - 07/05 0700 In: 874 [P.O.:874] Out: 425 [Urine:425]  GENERAL: Awakens to voice, but falls asleep quickly, no distress SKIN: skin color, texture, turgor are normal, no rashes or significant  lesions EYES: normal, Conjunctiva are pink and non-injected, sclera clear LUNGS: clear to auscultation and percussion with normal breathing effort HEART: regular rate & rhythm and no murmurs and no lower extremity edema ABDOMEN:abdomen soft, non-tender and normal bowel sounds NEURO: alert & oriented x 3   LABORATORY DATA:  I have reviewed the data as listed CMP Latest Ref Rng & Units 10/19/2020 10/18/2020 10/17/2020  Glucose 70 - 99 mg/dL 182(H) 270(H) 79  BUN 8 - 23 mg/dL 29(H) 27(H) 22  Creatinine 0.44 - 1.00 mg/dL 0.60 0.63 0.55  Sodium 135 - 145 mmol/L 136 134(L) 139  Potassium 3.5 - 5.1 mmol/L 4.7 4.5 4.6  Chloride 98 - 111 mmol/L 103 102 103  CO2 22 - 32 mmol/L _0 Calcium 8.9 - 10.3 mg/dL 9.3 9.2 9.4  Total Protein 6.5 - 8.1 g/dL - - -  Total Bilirubin 0.3 - 1.2 mg/dL - - -  Alkaline Phos 38 - 126 U/L - - -  AST 15 - 41 U/L - - -  ALT 0 - 44 U/L - - -    Lab Results  Component Value Date   WBC 2.0 (L) 10/19/2020   HGB 6.5 (LL) 10/19/2020   HCT 20.0 (L) 10/19/2020   MCV 94.5 10/19/2020   PLT 18 (LL) 10/19/2020   NEUTROABS 0.5 (L) 10/15/2020    DG Chest 2 View  Result Date: 10/04/2020 CLINICAL DATA:  Shortness of breath and chest tightness EXAM: CHEST - 2 VIEW COMPARISON:  10/01/2020 FINDINGS: Cardiac shadow is enlarged but stable. Bilateral basilar infiltrates are seen although slightly improved when compared with the prior  study. No sizable effusion is seen. No acute bony abnormality is seen. IMPRESSION: Improving bibasilar infiltrates when compare with the prior study. Electronically Signed   By: Inez Catalina M.D.   On: 10/04/2020 18:03   DG Chest 2 View  Result Date: 10/01/2020 CLINICAL DATA:  Short of breath.  Cough and fever EXAM: CHEST - 2 VIEW COMPARISON:  09/01/2020 FINDINGS: Bibasilar airspace disease unchanged from the prior study. Upper lobes remain clear. No effusion. IMPRESSION: Bibasilar infiltrates unchanged from 09/01/2020. Possible atelectasis or  persistent pneumonia. No effusion. Electronically Signed   By: Franchot Gallo M.D.   On: 10/01/2020 17:37   CT Angio Chest PE W and/or Wo Contrast  Result Date: 10/05/2020 CLINICAL DATA:  PE suspected EXAM: CT ANGIOGRAPHY CHEST WITH CONTRAST TECHNIQUE: Multidetector CT imaging of the chest was performed using the standard protocol during bolus administration of intravenous contrast. Multiplanar CT image reconstructions and MIPs were obtained to evaluate the vascular anatomy. CONTRAST:  111m OMNIPAQUE IOHEXOL 350 MG/ML SOLN COMPARISON:  CT chest pulmonary angiogram, 05/02/2020, CT chest abdomen pelvis angiogram, 08/15/2019 FINDINGS: Cardiovascular: Satisfactory opacification of the pulmonary arteries to the segmental level. No evidence of pulmonary embolism. Mild cardiomegaly. Enlargement of the main pulmonary artery, measuring up to 3.6 cm in caliber. No pericardial effusion. Aortic atherosclerosis. Mediastinum/Nodes: No enlarged mediastinal, hilar, or axillary lymph nodes. Thyroid gland, trachea, and esophagus demonstrate no significant findings. Lungs/Pleura: Examination of the lung parenchyma is generally somewhat limited by breath motion artifact and expiratory technique. Within this limitation, there appears to be a combination of acute, heterogeneous airspace disease superimposed upon a background of moderate pulmonary fibrosis. Although not well characterized on this examination, predominant characteristics include peribronchovascular irregular peripheral interstitial opacity and ground-glass, some degree of bronchiectasis, and some irregular peripheral interstitial opacity and ground-glass at the bilateral lung bases. General appearance of the lungs is similar when compared to prior examinations. Trace bilateral pleural effusions. Upper Abdomen: No acute abnormality. Splenomegaly in the included upper abdomen, which is markedly increased compared to prior examination, although incompletely imaged the  spleen measures at least 16.7 cm. Musculoskeletal: No chest wall abnormality. No acute or significant osseous findings. Review of the MIP images confirms the above findings. IMPRESSION: 1. Negative examination for pulmonary embolism. 2. Examination of the lung parenchyma is generally somewhat limited by breath motion artifact and expiratory technique. Within this limitation, there appears to be a combination of acute, heterogeneous airspace disease superimposed upon a background of moderate pulmonary fibrosis. Although not well characterized on this examination, predominant characteristics include peribronchovascular irregular peripheral interstitial opacity and ground-glass, some degree of bronchiectasis, and some irregular peripheral interstitial opacity and ground-glass at the bilateral lung bases. General appearance of the lungs is similar when compared to prior examinations and suggests fibrosis in an "alternative diagnosis" pattern, leading differential consideration fibrotic NSIP. Consider ILD protocol CT to further evaluate at the resolution of acute clinical presentation. 3. Trace bilateral pleural effusions. 4. Splenomegaly in the included upper abdomen, which is markedly increased compared to prior examination, although incompletely imaged the spleen measures at least 16.7 cm. 5. Enlargement of the main pulmonary artery, as can be seen in pulmonary hypertension. 6. Cardiomegaly. Aortic Atherosclerosis (ICD10-I70.0). Electronically Signed   By: AEddie CandleM.D.   On: 10/05/2020 09:21   CT HIP RIGHT WO CONTRAST  Result Date: 10/12/2020 CLINICAL DATA:  Hip fracture EXAM: CT OF THE RIGHT HIP WITHOUT CONTRAST TECHNIQUE: Multidetector CT imaging of the right hip was performed according to the standard protocol.  Multiplanar CT image reconstructions were also generated. COMPARISON:  CT 07/07/2020 FINDINGS: Bones/Joint/Cartilage Similar appearance of the ununited, comminuted right inter trochanteric fracture  transfixed with an intramedullary nail and interlocking femoral neck screws. There is callus formation without significant bony bridging. Persistent low-attenuation material along the lateral margin of the fracture (axial images 58-65). There is a soft tissue tract extending towards the skin surface, and skin thickening of the lateral thigh. There is no new focal fluid collection. Hardware is intact without evidence of loosening or new fracture. 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 Severe right hip osteoarthritis. There is no hip effusion. Ligaments Suboptimally assessed by CT. Muscles and Tendons No muscle atrophy. Soft tissues No well-defined/rim enhancing fluid collection. IMPRESSION: Similar appearance of the ununited, comminuted right intertrochanteric fracture with intramedullary nail and and screw fixation. Intact hardware without evidence of loosening. Persistent soft tissue tract extending towards the skin surface with underlying low attenuation material along the lateral margin of the fracture, possibly residual blood products, however infectious process cannot be completely excluded. There is no well-defined/drainable fluid collection. If there is concern for soft tissue infection extending from the fracture site, MRI of the right hip utilizing metal artifact reduction sequences (MARS protocol) may be helpful for further evaluation. Electronically Signed   By: Maurine Simmering   On: 10/12/2020 17:40   DG CHEST PORT 1 VIEW  Result Date: 10/07/2020 CLINICAL DATA:  Shortness of breath EXAM: PORTABLE CHEST 1 VIEW COMPARISON:  10/04/2020, 10/01/2020, 09/01/2020 FINDINGS: Cardiomegaly. Underlying chronic interstitial lung disease. Probable superimposed heterogeneous airspace opacities in the lower lungs without significant change. No pneumothorax. IMPRESSION: Interstitial lung disease with no significant change in probable superimposed heterogeneous airspace opacities.  Electronically Signed   By: Donavan Foil M.D.   On: 10/07/2020 18:40    ASSESSMENT AND PLAN: 1. CMML, status post 2 cycles of 5-azacytidine, scheduled to begin cycle #3 on 10/19/2020 2.  Anemia 3.  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  SIRS 5.  Chronic respiratory failure with hypoxia and interstitial lung disease,?  Healthcare associated pneumonia 6.  Coagulopathy 7.  Diastolic CHF 8.  Hypertension 9.  Diabetes mellitus 10.  Anxiety 11.  Hyperbilirubinemia 12.  Splenomegaly secondary to #1  -Labs from today have been reviewed.  Her WBC is 2.0, hemoglobin 6.8, platelets 18,000. -As of today, the patient has received 8 units PRBC so far this admission.  Agree with transfusing 1 unit PRBCs today.  Recommend PRBC transfusion to keep hemoglobin above 7. -Platelet count is 18,000 today.  She is not actively bleeding.  No need for platelet transfusion at this time.  Recommend platelet transfusion for active bleeding. -We will administer her weekly Nplate today.  Dose will be increased from 5 mcg/kg to 7 mcg/kg. -The patient was scheduled to begin her third cycle of 5-azacytidine in our office today.  Unfortunately, she remains hospitalized and continues to be transfusion dependent.  Further discussion per Dr. Irene Limbo later today regarding goals of care and further treatment recommendations.   LOS: 14 days   Mikey Bussing, DNP, AGPCNP-BC, AOCNP 10/19/20  ADDENDUM  .Patient was Personally and independently interviewed, examined and relevant elements of the history of present illness were reviewed in details and an assessment and plan was created. All elements of the patient's history of present illness , assessment and plan were discussed in details with Mikey Bussing, DNP, AGPCNP-BC, AOCNP. The above documentation reflects our combined findings assessment and plan.   Patient sleepy and  somewhat lethargic and unable to engage it detailed conversation and fell  asleep. I shall f/u again to initiate detailed goals of care discussion.  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 Her multiple other medical co-morbidities and poor functional status is precluding being able to maintain the intensity of outpatient oncologic and medical f/u needed to pursue supportive treatment and Vidaza chemotherapy for her CMML1. We has had multiple goals of care discussions over the last 3 months and she has been given the option for best supportive cares through hospice. She has been inclined to try Vidaza and continue supportive transfusions but has now missed her 3rd cycle of Vidaza due to hospitalization.  Would recommend palliative care consult to help continue [place] discussions. If she declines option for best supportive care through hospice then we she might be looking at a tedious process of recurrent hospitalizations and transfusion support and will likely need SNF level cares on discharge.   Sullivan Lone MD MS

## 2020-10-19 NOTE — TOC Progression Note (Signed)
Transition of Care [facility]) - Progression Note    Patient Details  Name: Tenicia Gural MRN: [id number] Date of Birth: 17-Dec-1950  [facility]) CM/SW Contact  Sharlet Salina Mila Homer, LCSW Phone Number: 10/19/2020, 5:48 PM  Clinical Narrative:  Talked with MD today regarding patient and her open workman's compensation claim (patient has received checks so far) and that this impacts facilities from being able to file her [facility] insurance. Informed MD that clinicals were sent to [place] [facility] as they are trying to determine if they can pay for rehab, however per Opal Sidles the case manager, this current hospitalization has to be connected with the injury that took her out of work.   Patient does have Medicaid, however she is aware that Medicaid does not pay for rehab services. CSW will continue to follow and provide SW interventions services and assisting patient in having a safe discharge plan.    Expected Discharge Plan: Skilled Nursing Facility Barriers to Discharge: Continued Medical Work up  Expected Discharge Plan and Services Expected Discharge Plan: [place] In-house Referral: Clinical Social Work     Living arrangements for the past 2 months: Single Family Home                                       Social Determinants of Health (SDOH) Interventions    Readmission Risk Interventions Readmission Risk Prevention Plan 08/12/2019  Transportation Screening Complete  PCP or Specialist Appt within 3-5 Days Complete  HRI or [place] Complete  Social Work Consult for [place] Planning/Counseling Complete  Palliative Care Screening Not Applicable  Medication Review [employer]) Complete  Some recent data might be hidden

## 2020-10-19 NOTE — Progress Notes (Signed)
10/19/20 1551  Provider Notification  Provider Name/Title Dr. [place] Crane  Date Provider Notified 10/19/20  Time Provider Notified [phone]  Notification Type Page  Notification Reason Critical result  Test performed and critical result H & H  Date Critical Result Received 10/19/20  Time Critical Result Received 0657  Provider response See new orders  Date of Provider Response 10/19/20   Repeat is hemoglobin is 6.5.  New orders to transfuse blood received.  Will continue to monitor patient.  Earleen Reaper RN

## 2020-10-19 NOTE — Progress Notes (Signed)
PT Cancellation Note  Patient Details Name: Kathy Irwin MRN: [id number] DOB: 05-09-1950   Cancelled Treatment:    Reason Eval/Treat Not Completed: (P) Medical issues which prohibited therapy Pt Hgb 6.5 and currently receiving PRBC. PT will hold therapy until tomorrow.   Teodora Baumgarten B. Migdalia Dk PT, DPT Acute Rehabilitation Services Pager [phone] Office [phone]    [place] 10/19/2020, 1:35 PM

## 2020-10-19 NOTE — Progress Notes (Addendum)
Lasix and metoprolol on hold as pts  B/P 106/42, notified MD.

## 2020-10-20 ENCOUNTER — Inpatient Hospital Stay: Payer: 59

## 2020-10-20 DIAGNOSIS — J189 Pneumonia, unspecified organism: Secondary | ICD-10-CM | POA: Diagnosis not present

## 2020-10-20 DIAGNOSIS — C9310 Chronic myelomonocytic leukemia not having achieved remission: Secondary | ICD-10-CM | POA: Diagnosis not present

## 2020-10-20 DIAGNOSIS — D649 Anemia, unspecified: Secondary | ICD-10-CM | POA: Diagnosis not present

## 2020-10-20 DIAGNOSIS — I5032 Chronic diastolic (congestive) heart failure: Secondary | ICD-10-CM | POA: Diagnosis not present

## 2020-10-20 LAB — TYPE AND SCREEN
ABO/RH(D): AB POS
Antibody Screen: NEGATIVE
Unit division: 0

## 2020-10-20 LAB — GLUCOSE, CAPILLARY
Glucose-Capillary: 170 mg/dL — ABNORMAL HIGH (ref 70–99)
Glucose-Capillary: 142 mg/dL — ABNORMAL HIGH (ref 70–99)
Glucose-Capillary: 261 mg/dL — ABNORMAL HIGH (ref 70–99)
Glucose-Capillary: 235 mg/dL — ABNORMAL HIGH (ref 70–99)

## 2020-10-20 LAB — BASIC METABOLIC PANEL
Anion gap: 5 (ref 5–15)
BUN: 33 mg/dL — ABNORMAL HIGH (ref 8–23)
CO2: 27 mmol/L (ref 22–32)
Calcium: 9.2 mg/dL (ref 8.9–10.3)
Chloride: 105 mmol/L (ref 98–111)
Creatinine, Ser: 0.71 mg/dL (ref 0.44–1.00)
GFR, Estimated: >60 mL/min (ref >60)
Glucose, Bld: 257 mg/dL — ABNORMAL HIGH (ref 70–99)
Potassium: 5.1 mmol/L (ref 3.5–5.1)
Sodium: 137 mmol/L (ref 135–145)

## 2020-10-20 LAB — CBC
HCT: 24.7 % — ABNORMAL LOW (ref 36.0–46.0)
Hemoglobin: 7.9 g/dL — ABNORMAL LOW (ref 12.0–15.0)
MCH: 29.8 pg (ref 26.0–34.0)
MCHC: 32 g/dL (ref 30.0–36.0)
MCV: 93.2 fL (ref 80.0–100.0)
Platelets: 20 10*3/uL — CL (ref 150–400)
RBC: 2.65 MIL/uL — ABNORMAL LOW (ref 3.87–5.11)
RDW: 16.4 % — ABNORMAL HIGH (ref 11.5–15.5)
WBC: 2.6 10*3/uL — ABNORMAL LOW (ref 4.0–10.5)
nRBC: 14.3 % — ABNORMAL HIGH (ref 0.0–0.2)

## 2020-10-20 LAB — BPAM RBC
Blood Product Expiration Date: 202207302359
ISSUE DATE / TIME: 202207051222
Unit Type and Rh: 8400

## 2020-10-20 MED ORDER — METOPROLOL TARTRATE 12.5 MG HALF TABLET
12.5000 mg | ORAL_TABLET | Freq: Two times a day (BID) | ORAL | Status: DC
  Administered 2020-10-21 – 2020-10-27 (×13): 12.5 mg via ORAL
  Filled 2020-10-20 (×16): qty 1

## 2020-10-20 NOTE — Progress Notes (Signed)
Physical Therapy Treatment Patient Details Name: Kathy Irwin MRN: [id number] DOB: May 24, 1950 Today's Date: 10/20/2020    History of Present Illness 70 y.o. female presents to ED 10/04/20 with continued complaints of SoB and chest tightness. Presented to [facility] with similar complaints 6/17 In ED  HGB 5.1 and received 2 units of PRBC Chest x-ray noted improving bibasilar infiltrates compared to x-ray from 6/17 CT of the chest performed which was negative for PE but did show increase in her splenomegaly. 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  She last had received infusion with romiplostim on 6/14.    PT Comments    Pt reports feeling slightly better after transfusion yesterday, however is feeling weak and has a tremor. Pt is limited in safe mobility by tremor,increased weakness, decreased strength and decreased endurance.  Pt is currently min guard for bed mobility, modAx2 for transfers and is not able to progress ambulation due to bilateral knee weakness. D/c plans remain appropriate at this time. PT will continue to follow acutely.  Follow Up Recommendations  SNF     Equipment Recommendations  None recommended by PT    Recommendations for Other Services       Precautions / Restrictions Precautions Precautions: Fall Precaution Comments: watch O2 Restrictions Weight Bearing Restrictions: No    Mobility  Bed Mobility Overal bed mobility: Needs Assistance Bed Mobility: Supine to Sit     Supine to sit: Min guard;HOB elevated     General bed mobility comments: min G for safety, cuing for sequencing and reaching for bed rail to scoot to EoB    Transfers Overall transfer level: Needs assistance Equipment used: Rolling walker (2 wheeled) Transfers: Sit to/from Stand Sit to Stand: Mod assist;+2 physical assistance;Min guard Stand  pivot transfers: Mod assist;+2 physical assistance       General transfer comment: pt tries without assist and unable to come to upright and reach for RW, on second attempt pt requiring mod Ax2 for power up and steadying at RW, min guard for power up pulling up at the sink, modAx 2 for stand pivot/stepping transfer from bed to chair  Ambulation/Gait             General Gait Details: deferred due to weakness, rolled in recliner to sink for self care with OT       Balance Overall balance assessment: Needs assistance Sitting-balance support: Feet supported Sitting balance-Leahy Scale: Fair     Standing balance support: Single extremity supported;During functional activity Standing balance-Leahy Scale: Poor Standing balance comment: requires outside assist to maintain standing balance,                            Cognition Arousal/Alertness: Awake/alert Behavior During Therapy: WFL for tasks assessed/performed Overall Cognitive Status: Within Functional Limits for tasks assessed                                           General Comments General comments (skin integrity, edema, etc.): SaO2 on 2L O2 via [place] in supine 98%O2, dropped to 76% on RA, provided 2L O2 via [place] and rebounded to 90%O2,      Pertinent Vitals/Pain Pain Assessment: Faces Faces Pain Scale: Hurts little  more Pain Location: R LE Pain Descriptors / Indicators: Discomfort;Cramping;Grimacing Pain Intervention(s): Limited activity within patient's tolerance;Monitored during session;Repositioned     PT Goals (current goals can now be found in the care plan section) Acute Rehab PT Goals PT Goal Formulation: With patient/family Time For Goal Achievement: 10/20/20 Progress towards PT goals: Not progressing toward goals - comment (pt with increased weakness and mild tremor)    Frequency    Min 3X/week      PT Plan Current plan remains appropriate       AM-PAC PT "6 Clicks"  Mobility   Outcome Measure  Help needed turning from your back to your side while in a flat bed without using bedrails?: None Help needed moving from lying on your back to sitting on the side of a flat bed without using bedrails?: A Little Help needed moving to and from a bed to a chair (including a wheelchair)?: Total Help needed standing up from a chair using your arms (e.g., wheelchair or bedside chair)?: Total Help needed to walk in hospital room?: Total Help needed climbing 3-5 steps with a railing? : Total 6 Click Score: 11    End of Session Equipment Utilized During Treatment: Gait belt;Oxygen Activity Tolerance: Patient limited by fatigue;Patient limited by pain Patient left: in chair;with call bell/phone within reach;with chair alarm set Nurse Communication: Mobility status PT Visit Diagnosis: Other abnormalities of gait and mobility (R26.89);Muscle weakness (generalized) (M62.81);Difficulty in walking, not elsewhere classified (R26.2);Pain Pain - Right/Left: Right Pain - part of body: Hip     Time: 4944-9675 PT Time Calculation (min) (ACUTE ONLY): 15 min  Charges:  $Therapeutic Activity: 8-22 mins                     Hiawatha Merriott B. Migdalia Dk PT, DPT Acute Rehabilitation Services Pager [phone] Office [phone]    [place] 10/20/2020, 10:28 AM

## 2020-10-20 NOTE — Progress Notes (Signed)
PROGRESS NOTE    Kathy Irwin  FKC:[id number] DOB: February 15, 1951 DOA: 10/04/2020 PCP: Kerin Perna, NP    Brief Narrative:  70 year old female with history of CMML, chronic ITP, diastolic heart failure, diabetes, hypertension, presents to the hospital with complaints of shortness of breath.  She was noted to be getting dizzy and breaking out in a sweat when she was standing up.  Hemoglobin noted to be 5.  She was also significantly thrombocytopenic.  CTA chest negative for PE.  She was transfused PRBC and oncology was consulted.  Assessment & Plan:   Active Problems:   Type 2 diabetes mellitus with complication, without long-term current use of insulin (HCC)   HTN (hypertension)   Obesity (BMI 30-39.9)   Chronic diastolic CHF (congestive heart failure) (HCC)   HCAP (healthcare-associated pneumonia)   Hyperbilirubinemia   Idiopathic thrombocytopenia (HCC)   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HCC)   Symptomatic anemia  Symptomatic anemia/pancytopenia, POA, ongoing CMML -Likely related to chronic myelomonocytic leukemia and recent chemotherapy - defer to Oncology -Oncology plans to reduce patient's Nplate -Hemoglobin of 5.1 on admission -  -She has received a total of 7 units prbc thus far -labs continue to downtrend over the past few days concerning for worsening disease    Component Value Date/Time   HGB 7.9 (L) 10/20/2020 0257   HGB 6.0 (LL) 09/28/2020 1326   HGB 11.2 12/30/2019 1159   HCT 24.7 (L) 10/20/2020 0257   HCT 20.0 (L) 10/06/2020 0423   Acute on chronic respiratory failure with hypoxia -She is chronically on 2 to 3 L of oxygen -CT chest was negative for pulmonary embolus -CT did comment on possible interstitial lung disease versus pneumonia -She has completed antibiotic course -Currently on prednisone taper -Continue bronchodilators to treat any element of pulm fibrosis -Overall respiratory status improving  Thrombocytopenia -Likely secondary to above  CMML versus chemotherapy, she also has a component of ITP -She received Nplate (Romiplostim) 7/49, repeated 6/28 Lab Results  Component Value Date   PLT 20 (LL) 10/20/2020   Coagulopathy -Noted to have elevated INR - previously received vitamin K -INR trending down appropriately Lab Results  Component Value Date   INR 1.2 10/11/2020   INR 1.3 (H) 10/08/2020   INR 1.5 (H) 10/07/2020   Chronic diastolic congestive heart failure -currently appears to be euvolemic -continue home dose of lasix -continue to monitor volume status  Diabetes mellitus type 2, well controlled -Hold home dose of metformin -Continue on SSI -A1c from 4/25 noted to be 6 -blood sugars significantly elevated due to steroids -anticipate this should trend down as steroids are tapered -continue on lantus  AKI -likely related to IV diuretics -treated with limited IV fluids -now resolved  Hypertension -Continue metoprolol  Splenomegaly -Noted on CT scan, felt to be related to CMML  Generalized weakness -Seen by physical therapy with recommendations for skilled nursing facility -CSW is working on placement  Right hip pain -Patient has a history of right intertrochanteric/subtrochanteric femur fracture in 04/2020 status post intramedullary nail by Dr. Doreatha Martin -She has had many issues since surgery including infection and prolonged antibiotics -She has had continued pain since her fracture -Since she is 5 months out from her fracture, orthopedics has ordered CT of her hip for further evaluation -appreciate their insight and recommendations  DVT prophylaxis: SCDs Start: 10/05/20 0719 given coagulopathy as above  Code Status: Full code Family Communication: Discussed with patient with family at bedside Disposition Plan: Status is: Inpatient  Dispo: The patient is  from: Home              Anticipated d/c is to: SNF              Patient currently is not medically stable to d/c.   Difficult to place patient  Yes -patient has active Workmen's Comp. case ongoing which apparently prohibits her ability to transition to SNF with insurance as Per father's case is active.  She is not admitted for her right hip pain, but certainly her ambulatory dysfunction is related to her chronic hip pain from previous fall.  She is more unstable now secondary to acute issues with CMML, but again without previous hip fracture patient may be stable and reasonable for discharge home otherwise.  Consultants:  Oncology, orthopedic surgery  Antimicrobials:  Ceftriaxone 6/21 >6/25 Azithromycin 6/21 >6/25   Subjective: No acute issues or events overnight, patient reports poor sleep overnight, very somnolent this morning able to reorient and answer appropriately during our interview but very short discussion before patient falls back asleep.  She denies nausea vomiting diarrhea constipation headache fevers or chills..   Objective: Vitals:   10/19/20 1934 10/19/20 1935 10/19/20 2058 10/20/20 0454  BP:   (!) 105/56 136/72  Pulse:   85 88  Resp:   20 18  Temp:   98.7 F (37.1 C) 99 F (37.2 C)  TempSrc:   Oral Oral  SpO2: 97% 97% 96% 92%  Weight:      Height:        Intake/Output Summary (Last 24 hours) at 10/20/2020 0755 Last data filed at 10/19/2020 2200 Gross per 24 hour  Intake 1192 ml  Output 600 ml  Net 592 ml    Filed Weights   10/12/20 2100 10/13/20 0618 10/14/20 0438  Weight: 103.4 kg 103 kg 102.3 kg    Examination:  General exam: Somnolent but arousable, oriented x 3 Cardiovascular system:RRR. No murmurs, rubs, gallops. Gastrointestinal system: Abdomen is nondistended, soft and nontender. No organomegaly or masses felt. Normal bowel sounds heard. Central nervous system: Alert and oriented. No focal neurological deficits. Extremities: No C/C/E, +pedal pulses Skin: No rashes, lesions or ulcers  Data Reviewed: I have personally reviewed following labs and imaging studies  CBC: Recent Labs  Lab  10/15/20 0420 10/15/20 2227 10/16/20 0416 10/17/20 0448 10/18/20 0414 10/19/20 0317 10/19/20 0619 10/20/20 0257  WBC 1.7*  1.8*  --  2.2* 2.0* 2.0* 2.0*  --  2.6*  NEUTROABS 0.5*  --   --   --   --   --   --   --   HGB 7.4*  7.3*   < > 8.0* 7.7* 7.6* 6.8* 6.5* 7.9*  HCT 22.4*  22.4*   < > 24.2* 23.9* 23.3* 20.7* 20.0* 24.7*  MCV 92.9  94.1  --  93.4 94.1 94.3 94.5  --  93.2  PLT 13*  15*  --  14* 15* 19* 18*  --  20*   < > = values in this interval not displayed.    Basic Metabolic Panel: Recent Labs  Lab 10/16/20 0416 10/17/20 0448 10/18/20 0414 10/19/20 0317 10/20/20 0257  NA 133* 139 134* 136 137  K 4.3 4.6 4.5 4.7 5.1  CL 100 103 102 103 105  CO2 _0 GLUCOSE 183* 79 270* 182* 257*  BUN 21 22 27* 29* 33*  CREATININE 0.54 0.55 0.63 0.60 0.71  CALCIUM 9.1 9.4 9.2 9.3 9.2    GFR: Estimated Creatinine Clearance: 76.1  mL/min (by C-G formula based on SCr of 0.71 mg/dL). Liver Function Tests: No results for input(s): AST, ALT, ALKPHOS, BILITOT, PROT, ALBUMIN in the last 168 hours.  No results for input(s): LIPASE, AMYLASE in the last 168 hours. No results for input(s): AMMONIA in the last 168 hours. Coagulation Profile: No results for input(s): INR, PROTIME in the last 168 hours.  Cardiac Enzymes: No results for input(s): CKTOTAL, CKMB, CKMBINDEX, TROPONINI in the last 168 hours. BNP (last 3 results) No results for input(s): PROBNP in the last 8760 hours. HbA1C: No results for input(s): HGBA1C in the last 72 hours. CBG: Recent Labs  Lab 10/19/20 0646 10/19/20 1135 10/19/20 1650 10/19/20 2101 10/20/20 0647  GLUCAP 89 120* 266* 279* 170*    Lipid Profile: No results for input(s): CHOL, HDL, LDLCALC, TRIG, CHOLHDL, LDLDIRECT in the last 72 hours. Thyroid Function Tests: No results for input(s): TSH, T4TOTAL, FREET4, T3FREE, THYROIDAB in the last 72 hours. Anemia Panel: No results for input(s): VITAMINB12, FOLATE, FERRITIN, TIBC, IRON,  RETICCTPCT in the last 72 hours.   Sepsis Labs: No results for input(s): PROCALCITON, LATICACIDVEN in the last 168 hours.   No results found for this or any previous visit (from the past 240 hour(s)).   Radiology Studies: No results found.  Scheduled Meds:  sodium chloride   Intravenous Once   sodium chloride   Intravenous Once   sodium chloride   Intravenous Once   diclofenac Sodium  2 g Topical TID AC & HS   furosemide  40 mg Oral Daily   guaiFENesin  600 mg Oral BID   insulin aspart  0-20 Units Subcutaneous TID WC   insulin aspart  0-5 Units Subcutaneous QHS   insulin aspart  3 Units Subcutaneous TID WC   insulin glargine  20 Units Subcutaneous QHS   linaclotide  145 mcg Oral QAC breakfast   metoprolol tartrate  50 mg Oral q morning   And   metoprolol tartrate  25 mg Oral QPM   montelukast  10 mg Oral QHS   olopatadine  1 drop Both Eyes BID   predniSONE  30 mg Oral Q breakfast   pregabalin  100 mg Oral QHS   sertraline  100 mg Oral QHS   sodium chloride flush  3 mL Intravenous Q12H   traZODone  100 mg Oral QHS     LOS: 15 days   Time spent: 67mns  Kydan Shanholtzer C Millee Denise, DO [facility]   If 7PM-7AM, please contact night-coverage [email or web address]  10/20/2020, 7:55 AM

## 2020-10-20 NOTE — Plan of Care (Signed)

## 2020-10-20 NOTE — Progress Notes (Signed)
Occupational Therapy Treatment Patient Details Name: Kathy Irwin MRN: [id number] DOB: [birth year] Today's Date: 10/20/2020    History of present illness 70 y.o. female presents to ED 10/04/20 with continued complaints of SoB and chest tightness. Presented to [facility] with similar complaints 6/17 In ED  HGB 5.1 and received 2 units of PRBC Chest x-ray noted improving bibasilar infiltrates compared to x-ray from 6/17 CT of the chest performed which was negative for PE but did show increase in her splenomegaly. 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  She last had received infusion with romiplostim on 6/14.   OT comments  Pt weaker requiring +2 assist for SPT to recliner from bed. Able to perform sit<>stand at sink for grooming tasks with min A/min guard. Pt fatigues quickly, hands weaker dropping grooming items. Required 3L O2 during activity to maintain saturations. Pt expressing frustration about generalized weakness and decreased independence due to prolonged hospitalization. OT provided encouragement.     Follow Up Recommendations  SNF    Equipment Recommendations  None recommended by OT    Recommendations for Other Services      Precautions / Restrictions Precautions Precautions: Fall Precaution Comments: watch O2 Restrictions Weight Bearing Restrictions: No       Mobility Bed Mobility Overal bed mobility: Needs Assistance Bed Mobility: Supine to Sit     Supine to sit: Min guard;HOB elevated     General bed mobility comments: OOB with PT    Transfers Overall transfer level: Needs assistance Equipment used: Rolling walker (2 wheeled) Transfers: Sit to/from Stand Sit to Stand: Mod assist;+2 physical assistance;Min guard Stand pivot transfers: Mod assist;+2 physical assistance       General transfer comment: pt tries  without assist and unable to come to upright and reach for RW, on second attempt pt requiring mod Ax2 for power up and steadying at RW, min guard for power up pulling up at the sink, modAx 2 for stand pivot/stepping transfer from bed to chair. when standing at sink, min guard with pulling up on sink surface    Balance Overall balance assessment: Needs assistance Sitting-balance support: Feet supported Sitting balance-Leahy Scale: Fair     Standing balance support: Single extremity supported;During functional activity Standing balance-Leahy Scale: Poor Standing balance comment: requires outside assist to maintain standing balance,                           ADL either performed or assessed with clinical judgement   ADL Overall ADL's : Needs assistance/impaired     Grooming: Set up;Sitting;Standing;Wash/dry hands;Wash/dry face;Oral care;Applying deodorant Grooming Details (indicate cue type and reason): Pt performing sit<>stand at sink, able to stand initially for approx 20 seconds, then seated for majority of other grooming tasks with one additional stand at end. Pt dropping deoderant and complaining of weak grasp Upper Body Bathing: Minimal assistance;Sitting Upper Body Bathing Details (indicate cue type and reason): for back             Toilet Transfer: Minimal assistance;+2 for physical assistance;+2 for safety/equipment;Stand-pivot;RW Toilet Transfer Details (indicate cue type and reason): assist to steady RW, and for boost         Functional mobility during ADLs: Minimal assistance;+2 for safety/equipment (close chair follow) General ADL Comments: increased weakness, continues to be SOB and DOE  Vision       Perception     Praxis      Cognition Arousal/Alertness: Awake/alert Behavior During Therapy: WFL for tasks assessed/performed Overall Cognitive Status: Within Functional Limits for tasks assessed                                           Exercises     Shoulder Instructions       General Comments SaO2 on 2L O2 via [place] in supine 98%O2, dropped to 76% on RA, provided 2L O2 via [place] and rebounded to 90%O2, Pt required 3L O2 via [place] during activity and able to maintain >90% with rest breaks and PLB    Pertinent Vitals/ Pain       Pain Assessment: Faces Faces Pain Scale: Hurts little more Pain Location: R LE Pain Descriptors / Indicators: Discomfort;Cramping;Grimacing Pain Intervention(s): Monitored during session;Repositioned  Home Living                                          Prior Functioning/Environment              Frequency  Min 2X/week        Progress Toward Goals  OT Goals(current goals can now be found in the care plan section)  Progress towards OT goals: Not progressing toward goals - comment (increased weakness this session (goals still appropriate))  Acute Rehab OT Goals Patient Stated Goal: be able to walk again OT Goal Formulation: With patient Time For Goal Achievement: 11/03/20 Potential to Achieve Goals: Good ADL Goals Pt Will Perform Grooming: with supervision;sitting;standing Pt Will Perform Lower Body Dressing: with min assist;with adaptive equipment;sit to/from stand Pt Will Transfer to Toilet: with supervision;ambulating Pt Will Perform Toileting - Clothing Manipulation and hygiene: with min guard assist;sit to/from stand Additional ADL Goal #1: Pt will state 3 energy conservation strategies in order to increase independence during grooming tasks and ADL routine Additional ADL Goal #2: Pt will perform OOB ADL tasks x10 min self monitoring O2 sats >90%  Plan Discharge plan remains appropriate    Co-evaluation                 AM-PAC OT "6 Clicks" Daily Activity     Outcome Measure   Help from another person eating meals?: None Help from another person taking care of personal grooming?: A Little Help from another person toileting, which includes  using toliet, bedpan, or urinal?: A Lot Help from another person bathing (including washing, rinsing, drying)?: A Lot Help from another person to put on and taking off regular upper body clothing?: A Little Help from another person to put on and taking off regular lower body clothing?: A Lot 6 Click Score: 16    End of Session Equipment Utilized During Treatment: Gait belt;Rolling walker;Oxygen (2-3L)  OT Visit Diagnosis: Unsteadiness on feet (R26.81);Muscle weakness (generalized) (M62.81)   Activity Tolerance Patient limited by fatigue   Patient Left in chair;with call bell/phone within reach;with chair alarm set   Nurse Communication Mobility status        Time: 2751-7001 OT Time Calculation (min): 38 min  Charges: OT General Charges $OT Visit: 1 Visit OT Treatments $Self Care/Home Management : 8-22 mins $Therapeutic Activity: 8-22 mins  Jesse Sans OTR/L Acute Rehabilitation Services Pager:  [phone] Office: [place] 10/20/2020, 10:49 AM

## 2020-10-21 ENCOUNTER — Inpatient Hospital Stay: Payer: 59

## 2020-10-21 DIAGNOSIS — Z7189 Other specified counseling: Secondary | ICD-10-CM

## 2020-10-21 DIAGNOSIS — C9310 Chronic myelomonocytic leukemia not having achieved remission: Secondary | ICD-10-CM | POA: Diagnosis not present

## 2020-10-21 LAB — BASIC METABOLIC PANEL
Anion gap: 8 (ref 5–15)
BUN: 26 mg/dL — ABNORMAL HIGH (ref 8–23)
CO2: 25 mmol/L (ref 22–32)
Calcium: 9.3 mg/dL (ref 8.9–10.3)
Chloride: 106 mmol/L (ref 98–111)
Creatinine, Ser: 0.6 mg/dL (ref 0.44–1.00)
GFR, Estimated: >60 mL/min (ref >60)
Glucose, Bld: 156 mg/dL — ABNORMAL HIGH (ref 70–99)
Potassium: 4.6 mmol/L (ref 3.5–5.1)
Sodium: 139 mmol/L (ref 135–145)

## 2020-10-21 LAB — CBC
HCT: 24.2 % — ABNORMAL LOW (ref 36.0–46.0)
Hemoglobin: 7.5 g/dL — ABNORMAL LOW (ref 12.0–15.0)
MCH: 29.5 pg (ref 26.0–34.0)
MCHC: 31 g/dL (ref 30.0–36.0)
MCV: 95.3 fL (ref 80.0–100.0)
Platelets: 23 10*3/uL — CL (ref 150–400)
RBC: 2.54 MIL/uL — ABNORMAL LOW (ref 3.87–5.11)
RDW: 16.3 % — ABNORMAL HIGH (ref 11.5–15.5)
WBC: 2.6 10*3/uL — ABNORMAL LOW (ref 4.0–10.5)
nRBC: 16.4 % — ABNORMAL HIGH (ref 0.0–0.2)

## 2020-10-21 LAB — GLUCOSE, CAPILLARY
Glucose-Capillary: 75 mg/dL (ref 70–99)
Glucose-Capillary: 131 mg/dL — ABNORMAL HIGH (ref 70–99)
Glucose-Capillary: 186 mg/dL — ABNORMAL HIGH (ref 70–99)
Glucose-Capillary: 257 mg/dL — ABNORMAL HIGH (ref 70–99)

## 2020-10-21 NOTE — Progress Notes (Signed)
PROGRESS NOTE    Kathy Irwin  VOZ:[id number] DOB: 1950-09-09 DOA: 10/04/2020 PCP: Kerin Perna, NP    Brief Narrative:  70 year old female with history of CMML, chronic ITP, diastolic heart failure, diabetes, hypertension, presents to the hospital with complaints of shortness of breath.  She was noted to be getting dizzy and breaking out in a sweat when she was standing up.  Hemoglobin noted to be 5.  She was also significantly thrombocytopenic.  CTA chest negative for PE.  She was transfused PRBC and oncology was consulted.  Assessment & Plan:   Active Problems:   Type 2 diabetes mellitus with complication, without long-term current use of insulin (HCC)   HTN (hypertension)   Obesity (BMI 30-39.9)   Chronic diastolic CHF (congestive heart failure) (HCC)   HCAP (healthcare-associated pneumonia)   Hyperbilirubinemia   Idiopathic thrombocytopenia (HCC)   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HCC)   Symptomatic anemia  Symptomatic anemia/pancytopenia, POA, ongoing CMML -Likely related to chronic myelomonocytic leukemia and recent chemotherapy - defer to Oncology -Oncology plans to reduce patient's Nplate -Hemoglobin of 5.1 on admission -  -She has received a total of 7 units prbc thus far -labs improving over the past 48 hours with repeat Nplate administration    Component Value Date/Time   HGB 7.5 (L) 10/21/2020 0454   HGB 6.0 (LL) 09/28/2020 1326   HGB 11.2 12/30/2019 1159   HCT 24.2 (L) 10/21/2020 0454   HCT 20.0 (L) 10/06/2020 0423   Acute on chronic respiratory failure with hypoxia -She is chronically on 2 to 3 L of oxygen -CT chest was negative for pulmonary embolus -CT did comment on possible interstitial lung disease versus pneumonia -She has completed antibiotic course -Currently on prednisone taper -Continue bronchodilators to treat any element of pulm fibrosis -Overall respiratory status improving  Thrombocytopenia -Likely secondary to above CMML  versus chemotherapy, she also has a component of ITP -She received Nplate (Romiplostim) 4/25, repeated 6/28 Lab Results  Component Value Date   PLT 23 (LL) 10/21/2020   Coagulopathy -Noted to have elevated INR - previously received vitamin K -INR trending down appropriately Lab Results  Component Value Date   INR 1.2 10/11/2020   INR 1.3 (H) 10/08/2020   INR 1.5 (H) 10/07/2020   Chronic diastolic congestive heart failure -currently appears to be euvolemic -continue home dose of lasix -continue to monitor volume status  Diabetes mellitus type 2, well controlled -Hold home dose of metformin -Continue on SSI -A1c from 4/25 noted to be 6 -blood sugars significantly elevated due to steroids -anticipate this should trend down as steroids are tapered -continue on lantus  AKI -likely related to IV diuretics -treated with limited IV fluids -now resolved  Hypertension -Continue metoprolol  Splenomegaly -Noted on CT scan, felt to be related to CMML  Generalized weakness -Seen by physical therapy with recommendations for skilled nursing facility -CSW is working on placement  Right hip pain -Patient has a history of right intertrochanteric/subtrochanteric femur fracture in 04/2020 status post intramedullary nail by Dr. Doreatha Martin -She has had many issues since surgery including infection and prolonged antibiotics -She has had continued pain since her fracture -Since she is 5 months out from her fracture, orthopedics has ordered CT of her hip for further evaluation -appreciate their insight and recommendations  DVT prophylaxis: SCDs Start: 10/05/20 0719 given coagulopathy as above  Code Status: Full code Family Communication: Discussed with patient with family at bedside Disposition Plan: Status is: Inpatient  Dispo: The patient is from: Home  Anticipated d/c is to: SNF              Patient currently is not medically stable to d/c.   Difficult to place patient Yes  -patient has active Workmen's Comp. case ongoing which apparently prohibits her ability to transition to SNF with insurance as Per father's case is active.  She is not admitted for her right hip pain, but certainly her ambulatory dysfunction is related to her chronic hip pain from previous fall.  She is more unstable now secondary to acute issues with CMML, but again without previous hip fracture patient may be stable and reasonable for discharge home otherwise.  Consultants:  Oncology, orthopedic surgery  Antimicrobials:  Ceftriaxone 6/21 >6/25 Azithromycin 6/21 >6/25   Subjective: No acute issues or events overnight denies nausea vomiting diarrhea constipation headache fevers or chills, much more awake alert oriented today at bedside, lengthy discussion about need for increased PT given her limited disposition options she may need discharge home with home health.  Objective: Vitals:   10/20/20 1840 10/20/20 2109 10/21/20 0519 10/21/20 0800  BP: (!) 109/54 (!) 99/50 (!) 107/54 103/63  Pulse: 87 79 70 75  Resp: _0 Temp: 98.1 F (36.7 C) 97.7 F (36.5 C) 98.1 F (36.7 C)   TempSrc: Oral Oral Oral   SpO2: 100% 100% 100%   Weight:   101.3 kg   Height:        Intake/Output Summary (Last 24 hours) at 10/21/2020 0912 Last data filed at 10/20/2020 1844 Gross per 24 hour  Intake 480 ml  Output 700 ml  Net -220 ml    Filed Weights   10/13/20 0618 10/14/20 0438 10/21/20 0519  Weight: 103 kg 102.3 kg 101.3 kg    Examination:  General exam: No acute distress sitting up in bedside chair ANO x4 today with family at bedside Cardiovascular system:RRR. No murmurs, rubs, gallops. Gastrointestinal system: Abdomen is nondistended, soft and nontender. No organomegaly or masses felt. Normal bowel sounds heard. Central nervous system: Alert and oriented. No focal neurological deficits. Extremities: No C/C/E, +pedal pulses Skin: No rashes, lesions or ulcers  Data Reviewed: I have  personally reviewed following labs and imaging studies  CBC: Recent Labs  Lab 10/15/20 0420 10/15/20 2227 10/17/20 0448 10/18/20 0414 10/19/20 0317 10/19/20 0619 10/20/20 0257 10/21/20 0454  WBC 1.7*  1.8*   < > 2.0* 2.0* 2.0*  --  2.6* 2.6*  NEUTROABS 0.5*  --   --   --   --   --   --   --   HGB 7.4*  7.3*   < > 7.7* 7.6* 6.8* 6.5* 7.9* 7.5*  HCT 22.4*  22.4*   < > 23.9* 23.3* 20.7* 20.0* 24.7* 24.2*  MCV 92.9  94.1   < > 94.1 94.3 94.5  --  93.2 95.3  PLT 13*  15*   < > 15* 19* 18*  --  20* 23*   < > = values in this interval not displayed.    Basic Metabolic Panel: Recent Labs  Lab 10/17/20 0448 10/18/20 0414 10/19/20 0317 10/20/20 0257 10/21/20 0454  NA 139 134* 136 137 139  K 4.6 4.5 4.7 5.1 4.6  CL 103 102 103 105 106  CO2 _1 GLUCOSE 79 270* 182* 257* 156*  BUN 22 27* 29* 33* 26*  CREATININE 0.55 0.63 0.60 0.71 0.60  CALCIUM 9.4 9.2 9.3 9.2 9.3    GFR: Estimated Creatinine Clearance:  75.7 mL/min (by C-G formula based on SCr of 0.6 mg/dL). Liver Function Tests: No results for input(s): AST, ALT, ALKPHOS, BILITOT, PROT, ALBUMIN in the last 168 hours.  No results for input(s): LIPASE, AMYLASE in the last 168 hours. No results for input(s): AMMONIA in the last 168 hours. Coagulation Profile: No results for input(s): INR, PROTIME in the last 168 hours.  Cardiac Enzymes: No results for input(s): CKTOTAL, CKMB, CKMBINDEX, TROPONINI in the last 168 hours. BNP (last 3 results) No results for input(s): PROBNP in the last 8760 hours. HbA1C: No results for input(s): HGBA1C in the last 72 hours. CBG: Recent Labs  Lab 10/20/20 0647 10/20/20 1156 10/20/20 1646 10/20/20 2147 10/21/20 0718  GLUCAP 170* 142* 261* 235* 75    Lipid Profile: No results for input(s): CHOL, HDL, LDLCALC, TRIG, CHOLHDL, LDLDIRECT in the last 72 hours. Thyroid Function Tests: No results for input(s): TSH, T4TOTAL, FREET4, T3FREE, THYROIDAB in the last 72  hours. Anemia Panel: No results for input(s): VITAMINB12, FOLATE, FERRITIN, TIBC, IRON, RETICCTPCT in the last 72 hours.   Sepsis Labs: No results for input(s): PROCALCITON, LATICACIDVEN in the last 168 hours.   No results found for this or any previous visit (from the past 240 hour(s)).   Radiology Studies: No results found.  Scheduled Meds:  sodium chloride   Intravenous Once   sodium chloride   Intravenous Once   sodium chloride   Intravenous Once   diclofenac Sodium  2 g Topical TID AC & HS   furosemide  40 mg Oral Daily   guaiFENesin  600 mg Oral BID   insulin aspart  0-20 Units Subcutaneous TID WC   insulin aspart  0-5 Units Subcutaneous QHS   insulin aspart  3 Units Subcutaneous TID WC   insulin glargine  20 Units Subcutaneous QHS   linaclotide  145 mcg Oral QAC breakfast   metoprolol tartrate  12.5 mg Oral BID   montelukast  10 mg Oral QHS   olopatadine  1 drop Both Eyes BID   predniSONE  30 mg Oral Q breakfast   pregabalin  100 mg Oral QHS   sertraline  100 mg Oral QHS   sodium chloride flush  3 mL Intravenous Q12H   traZODone  100 mg Oral QHS     LOS: 16 days   Time spent: 84mns  Travonta Gill C Mitchell Epling, DO [facility]   If 7PM-7AM, please contact night-coverage [email or web address]  10/21/2020, 9:12 AM

## 2020-10-21 NOTE — Progress Notes (Signed)
Oncology/Hematology Progress Note  DOS 10/21/2020  S: I followed up again with patient for goals of care discussion in the presence of her daughter. We discussed her poor functional status and significant burden of medical co-morbidities is significantly limiting her ability to tolerate treatment and that she continues to remain transfusion dependent and significantly thrombocytopenic. We discussed the significant burden of oncologic cares and limited likelihood of immediate improvement of her bone marrow functioning. We discussed a strong recommendation to consider best supportive cares through hospice. Patient declines this option and wants to continue supportive transfusions and Vidaza. We discussed that this option would require her to be able to maintain her functional status. She notes she wants to work with therapies at SNF  O BP 103/63 (BP Location: Left Wrist)   Pulse 75   Temp 98.1 F (36.7 C) (Oral)   Resp 18   Ht _0  (1.626 m)   Wt 223 lb 6.4 oz (101.3 kg)   SpO2 100%   BMI 38.35 kg/m  CVS S1S2 RRR RS AE b/l decreased ABD- Soft, BS+ CNS aloxox 3  LABS  . CBC Latest Ref Rng & Units 10/21/2020 10/20/2020 10/19/2020  WBC 4.0 - 10.5 K/uL 2.6(L) 2.6(L) -  Hemoglobin 12.0 - 15.0 g/dL 7.5(L) 7.9(L) 6.5(LL)  Hematocrit 36.0 - 46.0 % 24.2(L) 24.7(L) 20.0(L)  Platelets 150 - 400 K/uL 23(LL) 20(LL) -   . CMP Latest Ref Rng & Units 10/21/2020 10/20/2020 10/19/2020  Glucose 70 - 99 mg/dL 156(H) 257(H) 182(H)  BUN 8 - 23 mg/dL 26(H) 33(H) 29(H)  Creatinine 0.44 - 1.00 mg/dL 0.60 0.71 0.60  Sodium 135 - 145 mmol/L 139 137 136  Potassium 3.5 - 5.1 mmol/L 4.6 5.1 4.7  Chloride 98 - 111 mmol/L 106 105 103  CO2 22 - 32 mmol/L _1 Calcium 8.9 - 10.3 mg/dL 9.3 9.2 9.3  Total Protein 6.5 - 8.1 g/dL - - -  Total Bilirubin 0.3 - 1.2 mg/dL - - -  Alkaline Phos 38 - 126 U/L - - -  AST 15 - 41 U/L - - -  ALT 0 - 44 U/L - - -   ASSESSMENT AND PLAN  1. CMML, status post 2 cycles of  5-azacytidine, scheduled to begin cycle #3 on 10/19/2020 2.  Anemia 3.  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  SIRS 5.  Chronic respiratory failure with hypoxia and interstitial lung disease,?  Healthcare associated pneumonia 6.  Coagulopathy 7.  Diastolic CHF 8.  Hypertension 9.  Diabetes mellitus 10.  Anxiety 11.  Hyperbilirubinemia 12.  Splenomegaly secondary to #1 PLAN -goals of care discussed in details. Patient declines option for best supportive care through hospice. -will need rehab at SNF -she will inform us upon discharge to schedule her for lab monitoring and transfusion support at the cancer center and re-scheduling her missed C3 of Vidaza and continue weekly Nplate -transfuse prn for hgb<7.5 -transfuse for platelets<10k or if actively bleeding.  Sullivan Lone MD MS

## 2020-10-22 ENCOUNTER — Telehealth (HOSPITAL_COMMUNITY): Payer: Self-pay | Admitting: *Deleted

## 2020-10-22 ENCOUNTER — Inpatient Hospital Stay: Payer: 59

## 2020-10-22 DIAGNOSIS — C9310 Chronic myelomonocytic leukemia not having achieved remission: Secondary | ICD-10-CM | POA: Diagnosis not present

## 2020-10-22 DIAGNOSIS — Z7189 Other specified counseling: Secondary | ICD-10-CM | POA: Diagnosis not present

## 2020-10-22 LAB — GLUCOSE, CAPILLARY
Glucose-Capillary: 80 mg/dL (ref 70–99)
Glucose-Capillary: 104 mg/dL — ABNORMAL HIGH (ref 70–99)
Glucose-Capillary: 117 mg/dL — ABNORMAL HIGH (ref 70–99)
Glucose-Capillary: 266 mg/dL — ABNORMAL HIGH (ref 70–99)

## 2020-10-22 MED ORDER — PREDNISONE 20 MG PO TABS
20.0000 mg | ORAL_TABLET | Freq: Every day | ORAL | Status: DC
  Administered 2020-10-23 – 2020-10-24 (×2): 20 mg via ORAL
  Filled 2020-10-22 (×2): qty 1

## 2020-10-22 NOTE — Progress Notes (Signed)
Occupational Therapy Treatment Patient Details Name: Kathy Irwin MRN: [id number] DOB: 1950-07-23 Today's Date: 10/22/2020    History of present illness 70 y.o. female presents to ED 10/04/20 with continued complaints of SoB and chest tightness. Presented to [facility] with similar complaints 6/17 In ED  HGB 5.1 and received 2 units of PRBC Chest x-ray noted improving bibasilar infiltrates compared to x-ray from 6/17 CT of the chest performed which was negative for PE but did show increase in her splenomegaly. 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  She last had received infusion with romiplostim on 6/14.   OT comments  When therapist entered room her MD was with her. He states she needs rehab but insurance will not pay secondary to a workman comp claim. Pt. Required 3 attempts to preform supine to sit eob. Pt. Is requiring min a with ADLs and transfers. She also has 4 steps to climb to get into her home. Asking for PT to come see her to assess for this. The MD is thinking of sending her home this weekend. Acute OT to follow.   Follow Up Recommendations   (needs snf but insurance will not pay.)    Equipment Recommendations  None recommended by OT    Recommendations for Other Services      Precautions / Restrictions Precautions Precautions: Fall (Pt reports 1 fall in the past 6 months) Precaution Comments: watch O2 Restrictions Weight Bearing Restrictions: No       Mobility Bed Mobility Overal bed mobility: Needs Assistance Bed Mobility: Supine to Sit     Supine to sit: Min guard;HOB elevated     General bed mobility comments: Pt. took 3 attempts to sit EOB. Pt. cites increased weakness.    Transfers Overall transfer level: Needs assistance Equipment used: Rolling walker (2 wheeled)   Sit to Stand: Min assist Stand pivot  transfers: Min guard            Balance     Sitting balance-Leahy Scale: Good       Standing balance-Leahy Scale: Fair                             ADL either performed or assessed with clinical judgement   ADL Overall ADL's : Needs assistance/impaired Eating/Feeding: Modified independent   Grooming: Wash/dry hands;Wash/dry face;Applying deodorant;Set up;Sitting   Upper Body Bathing: Set up;Sitting   Lower Body Bathing: Minimal assistance;Sit to/from stand   Upper Body Dressing : Set up;Sitting   Lower Body Dressing: Moderate assistance;Sit to/from stand   Toilet Transfer: Minimal assistance;BSC;RW           Functional mobility during ADLs: Minimal assistance;Rolling walker General ADL Comments: Pt. has increased weakness and was able to don socks without AE. Pt. may benefit from further AE training for energy conservation.     Vision   Vision Assessment?: No apparent visual deficits   Perception     Praxis      Cognition Arousal/Alertness: Awake/alert Behavior During Therapy: WFL for tasks assessed/performed Overall Cognitive Status: Within Functional Limits for tasks assessed  Exercises     Shoulder Instructions       General Comments      Pertinent Vitals/ Pain       Pain Assessment: 0-10 Pain Score: 4  Pain Location: r hip Pain Descriptors / Indicators: Aching Pain Intervention(s): Premedicated before session  Home Living                                          Prior Functioning/Environment              Frequency  Min 2X/week        Progress Toward Goals  OT Goals(current goals can now be found in the care plan section)  Progress towards OT goals: Progressing toward goals  Acute Rehab OT Goals Patient Stated Goal: get stronger OT Goal Formulation: With patient Time For Goal Achievement: 11/03/20 Potential to Achieve Goals: Good ADL  Goals Pt Will Perform Grooming: with supervision;sitting;standing Pt Will Perform Lower Body Dressing: with min assist;with adaptive equipment;sit to/from stand Pt Will Transfer to Toilet: with supervision;ambulating Pt Will Perform Toileting - Clothing Manipulation and hygiene: with min guard assist;sit to/from stand Additional ADL Goal #1: Pt will state 3 energy conservation strategies in order to increase independence during grooming tasks and ADL routine Additional ADL Goal #2: Pt will perform OOB ADL tasks x10 min self monitoring O2 sats >90%  Plan Discharge plan remains appropriate    Co-evaluation                 AM-PAC OT "6 Clicks" Daily Activity     Outcome Measure   Help from another person eating meals?: None Help from another person taking care of personal grooming?: A Little Help from another person toileting, which includes using toliet, bedpan, or urinal?: A Little Help from another person bathing (including washing, rinsing, drying)?: A Little Help from another person to put on and taking off regular upper body clothing?: A Little Help from another person to put on and taking off regular lower body clothing?: A Lot 6 Click Score: 18    End of Session Equipment Utilized During Treatment: Gait belt;Rolling walker;Oxygen  OT Visit Diagnosis: Unsteadiness on feet (R26.81);Muscle weakness (generalized) (M62.81)   Activity Tolerance Patient limited by fatigue   Patient Left in chair;with call bell/phone within reach;with chair alarm set   Nurse Communication  (ok therapy)        Time: 2725-3664 OT Time Calculation (min): 61 min  Charges: OT General Charges $OT Visit: 1 Visit OT Treatments $Self Care/Home Management : 53-67 mins  Reece Packer OT/L   Silus Lanzo 10/22/2020, 11:37 AM

## 2020-10-22 NOTE — TOC Progression Note (Signed)
Transition of Care [facility]) - Progression Note    Patient Details  Name: Kathy Irwin MRN: [id number] Date of Birth: 05-Nov-1950  [facility] CM/SW Contact  Sharlet Salina Mila Homer, LCSW Phone Number: 10/22/2020, 7:06 PM  Clinical Narrative:  CSW continuing to follow. Will make contact with Opal Sidles with [place] Case Management next week regarding patient's discharge disposition..     Expected Discharge Plan: Skilled Nursing Facility Barriers to Discharge: Continued Medical Work up  Expected Discharge Plan and Services Expected Discharge Plan: [place] In-house Referral: Clinical Social Work     Living arrangements for the past 2 months: Single Family Home                                       Social Determinants of Health (SDOH) Interventions    Readmission Risk Interventions Readmission Risk Prevention Plan 08/12/2019  Transportation Screening Complete  PCP or Specialist Appt within 3-5 Days Complete  HRI or [place] Complete  Social Work Consult for [place] Planning/Counseling Complete  Palliative Care Screening Not Applicable  Medication Review [employer]) Complete  Some recent data might be hidden

## 2020-10-22 NOTE — Telephone Encounter (Signed)
Pt admitted to hospital she will call to r/s appt. Pt said she may get discharged to a rehab facility.

## 2020-10-22 NOTE — Progress Notes (Signed)
PROGRESS NOTE    Kathy Irwin  TJQ:[id number] DOB: [birth year] DOA: 10/04/2020 PCP: Kerin Perna, NP    Brief Narrative:  70 year old female with history of CMML, chronic ITP, diastolic heart failure, diabetes, hypertension, presents to the hospital with complaints of shortness of breath.  She was noted to be getting dizzy and breaking out in a sweat when she was standing up.  Hemoglobin noted to be 5.  She was also significantly thrombocytopenic.  CTA chest negative for PE.  She was transfused PRBC and oncology was consulted.  Assessment & Plan:   Active Problems:   Type 2 diabetes mellitus with complication, without long-term current use of insulin (HCC)   HTN (hypertension)   Obesity (BMI 30-39.9)   Chronic diastolic CHF (congestive heart failure) (HCC)   HCAP (healthcare-associated pneumonia)   Hyperbilirubinemia   Idiopathic thrombocytopenia (HCC)   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HCC)   Symptomatic anemia  Symptomatic anemia/pancytopenia, POA, ongoing CMML -Likely related to chronic myelomonocytic leukemia and recent chemotherapy - defer to Oncology -Oncology increased patient's Nplate dose as of 10/20/20 -Hemoglobin of 5.1 on admission -  -She has received a total of 7 units prbc thus far -labs improving over the past 48 hours with repeat Nplate administration -Oncology discussed moving forward with hospice but patient declined - will consult palliative care    Component Value Date/Time   HGB 7.5 (L) 10/21/2020 0454   HGB 6.0 (LL) 09/28/2020 1326   HGB 11.2 12/30/2019 1159   HCT 24.2 (L) 10/21/2020 0454   HCT 20.0 (L) 10/06/2020 0423   Acute on chronic respiratory failure with hypoxia -She is chronically on 2 to 3 L of oxygen -CT chest was negative for pulmonary embolus -CT did comment on possible interstitial lung disease versus pneumonia -She has completed antibiotic course -Currently on prednisone taper -Continue bronchodilators to treat any element  of pulm fibrosis -Overall respiratory status improving  Thrombocytopenia -Likely secondary to above CMML versus chemotherapy, she also has a component of ITP -She received Nplate (Romiplostim) 6/33, repeated 6/28 Lab Results  Component Value Date   PLT 23 (LL) 10/21/2020   Coagulopathy -Noted to have elevated INR - previously received vitamin K -INR trending down appropriately Lab Results  Component Value Date   INR 1.2 10/11/2020   INR 1.3 (H) 10/08/2020   INR 1.5 (H) 10/07/2020   Chronic diastolic congestive heart failure -currently appears to be euvolemic -continue home dose of lasix -continue to monitor volume status  Diabetes mellitus type 2, well controlled -Hold home dose of metformin -Continue on SSI -A1c from 4/25 noted to be 6 -blood sugars significantly elevated due to steroids -anticipate this should trend down as steroids are tapered -continue on lantus  AKI -likely related to IV diuretics -treated with limited IV fluids -now resolved  Hypertension -Continue metoprolol  Splenomegaly -Noted on CT scan, felt to be related to CMML  Generalized weakness -Seen by physical therapy with recommendations for skilled nursing facility -CSW is working on placement  Right hip pain -Patient has a history of right intertrochanteric/subtrochanteric femur fracture in 04/2020 status post intramedullary nail by Dr. Doreatha Martin -She has had many issues since surgery including infection and prolonged antibiotics -She has had continued pain since her fracture -Since she is 5 months out from her fracture, orthopedics has ordered CT of her hip for further evaluation -appreciate their insight and recommendations  DVT prophylaxis: SCDs Start: 10/05/20 0719 given coagulopathy as above  Code Status: Full code Family Communication: Discussed with patient  with family at bedside Disposition Plan: Status is: Inpatient  Dispo: The patient is from: Home              Anticipated d/c  is to: SNF              Patient currently is not medically stable to d/c.   Difficult to place patient Yes -patient has active Workmen's Comp. case ongoing which apparently prohibits her ability to transition to SNF with insurance as Per father's case is active.  She is not admitted for her right hip pain, but certainly her ambulatory dysfunction is related to her chronic hip pain from previous fall.  She is more unstable now secondary to acute issues with CMML, but again without previous hip fracture patient may be stable and reasonable for discharge home otherwise.  Consultants:  Oncology, orthopedic surgery  Antimicrobials:  Ceftriaxone 6/21 >6/25 Azithromycin 6/21 >6/25   Subjective: No acute issues or events overnight denies nausea vomiting diarrhea constipation headache fevers or chills, much more awake alert oriented today at bedside, lengthy discussion about need for increased PT given her limited disposition options she may need discharge home with home health.  Objective: Vitals:   10/21/20 1644 10/21/20 2249 10/22/20 0528 10/22/20 0528  BP: (!) 97/56 118/60 (!) 106/47 (!) 106/47  Pulse: 78 91 88 88  Resp: 18 (!) 22 (!) 22 (!) 22  Temp: 98 F (36.7 C) 97.6 F (36.4 C) 98.1 F (36.7 C) 98.1 F (36.7 C)  TempSrc: Oral Oral Oral Oral  SpO2: 92% 100% 100% 100%  Weight:      Height:        Intake/Output Summary (Last 24 hours) at 10/22/2020 1165 Last data filed at 10/21/2020 2300 Gross per 24 hour  Intake 620 ml  Output 650 ml  Net -30 ml    Filed Weights   10/13/20 0618 10/14/20 0438 10/21/20 0519  Weight: 103 kg 102.3 kg 101.3 kg    Examination:  General exam: No acute distress sitting up in bedside chair ANO x4 today with family at bedside Cardiovascular system:RRR. No murmurs, rubs, gallops. Gastrointestinal system: Abdomen is nondistended, soft and nontender. No organomegaly or masses felt. Normal bowel sounds heard. Central nervous system: Alert and oriented.  No focal neurological deficits. Extremities: No C/C/E, +pedal pulses Skin: No rashes, lesions or ulcers  Data Reviewed: I have personally reviewed following labs and imaging studies  CBC: Recent Labs  Lab 10/17/20 0448 10/18/20 0414 10/19/20 0317 10/19/20 0619 10/20/20 0257 10/21/20 0454  WBC 2.0* 2.0* 2.0*  --  2.6* 2.6*  HGB 7.7* 7.6* 6.8* 6.5* 7.9* 7.5*  HCT 23.9* 23.3* 20.7* 20.0* 24.7* 24.2*  MCV 94.1 94.3 94.5  --  93.2 95.3  PLT 15* 19* 18*  --  20* 23*    Basic Metabolic Panel: Recent Labs  Lab 10/17/20 0448 10/18/20 0414 10/19/20 0317 10/20/20 0257 10/21/20 0454  NA 139 134* 136 137 139  K 4.6 4.5 4.7 5.1 4.6  CL 103 102 103 105 106  CO2 _0 GLUCOSE 79 270* 182* 257* 156*  BUN 22 27* 29* 33* 26*  CREATININE 0.55 0.63 0.60 0.71 0.60  CALCIUM 9.4 9.2 9.3 9.2 9.3    GFR: Estimated Creatinine Clearance: 75.7 mL/min (by C-G formula based on SCr of 0.6 mg/dL). Liver Function Tests: No results for input(s): AST, ALT, ALKPHOS, BILITOT, PROT, ALBUMIN in the last 168 hours.  No results for input(s): LIPASE, AMYLASE in the last 168  hours. No results for input(s): AMMONIA in the last 168 hours. Coagulation Profile: No results for input(s): INR, PROTIME in the last 168 hours.  Cardiac Enzymes: No results for input(s): CKTOTAL, CKMB, CKMBINDEX, TROPONINI in the last 168 hours. BNP (last 3 results) No results for input(s): PROBNP in the last 8760 hours. HbA1C: No results for input(s): HGBA1C in the last 72 hours. CBG: Recent Labs  Lab 10/21/20 0718 10/21/20 1157 10/21/20 1638 10/21/20 2157 10/22/20 0655  GLUCAP 75 131* 186* 257* 80    Lipid Profile: No results for input(s): CHOL, HDL, LDLCALC, TRIG, CHOLHDL, LDLDIRECT in the last 72 hours. Thyroid Function Tests: No results for input(s): TSH, T4TOTAL, FREET4, T3FREE, THYROIDAB in the last 72 hours. Anemia Panel: No results for input(s): VITAMINB12, FOLATE, FERRITIN, TIBC, IRON, RETICCTPCT  in the last 72 hours.   Sepsis Labs: No results for input(s): PROCALCITON, LATICACIDVEN in the last 168 hours.   No results found for this or any previous visit (from the past 240 hour(s)).   Radiology Studies: No results found.  Scheduled Meds:  sodium chloride   Intravenous Once   sodium chloride   Intravenous Once   sodium chloride   Intravenous Once   diclofenac Sodium  2 g Topical TID AC & HS   furosemide  40 mg Oral Daily   guaiFENesin  600 mg Oral BID   insulin aspart  0-20 Units Subcutaneous TID WC   insulin aspart  0-5 Units Subcutaneous QHS   insulin aspart  3 Units Subcutaneous TID WC   insulin glargine  20 Units Subcutaneous QHS   linaclotide  145 mcg Oral QAC breakfast   metoprolol tartrate  12.5 mg Oral BID   montelukast  10 mg Oral QHS   olopatadine  1 drop Both Eyes BID   predniSONE  30 mg Oral Q breakfast   pregabalin  100 mg Oral QHS   sertraline  100 mg Oral QHS   sodium chloride flush  3 mL Intravenous Q12H   traZODone  100 mg Oral QHS     LOS: 17 days   Time spent: 40mns  Kaynan Klonowski C Nysir Fergusson, DO [facility]   If 7PM-7AM, please contact night-coverage [email or web address]  10/22/2020, 8:07 AM

## 2020-10-22 NOTE — Plan of Care (Signed)
  Problem: Health Behavior/Discharge Planning: Goal: Ability to manage health-related needs will improve Outcome: Progressing   Problem: Clinical Measurements: Goal: Ability to maintain clinical measurements within normal limits will improve Outcome: Progressing

## 2020-10-23 DIAGNOSIS — C9310 Chronic myelomonocytic leukemia not having achieved remission: Secondary | ICD-10-CM | POA: Diagnosis not present

## 2020-10-23 LAB — CBC
HCT: 18.6 % — ABNORMAL LOW (ref 36.0–46.0)
HCT: 21.1 % — ABNORMAL LOW (ref 36.0–46.0)
Hemoglobin: 6.1 g/dL — CL (ref 12.0–15.0)
Hemoglobin: 7.2 g/dL — ABNORMAL LOW (ref 12.0–15.0)
MCH: 30 pg (ref 26.0–34.0)
MCH: 30.8 pg (ref 26.0–34.0)
MCHC: 32.8 g/dL (ref 30.0–36.0)
MCHC: 34.1 g/dL (ref 30.0–36.0)
MCV: 91.6 fL (ref 80.0–100.0)
MCV: 90.2 fL (ref 80.0–100.0)
Platelets: 23 10*3/uL — CL (ref 150–400)
Platelets: 22 10*3/uL — CL (ref 150–400)
RBC: 2.03 MIL/uL — ABNORMAL LOW (ref 3.87–5.11)
RBC: 2.34 MIL/uL — ABNORMAL LOW (ref 3.87–5.11)
RDW: 16 % — ABNORMAL HIGH (ref 11.5–15.5)
RDW: 15.5 % (ref 11.5–15.5)
WBC: 3.4 10*3/uL — ABNORMAL LOW (ref 4.0–10.5)
WBC: 4 10*3/uL (ref 4.0–10.5)
nRBC: 11.1 % — ABNORMAL HIGH (ref 0.0–0.2)
nRBC: 15.5 % — ABNORMAL HIGH (ref 0.0–0.2)

## 2020-10-23 LAB — BASIC METABOLIC PANEL
Anion gap: 5 (ref 5–15)
BUN: 32 mg/dL — ABNORMAL HIGH (ref 8–23)
CO2: 30 mmol/L (ref 22–32)
Calcium: 9.1 mg/dL (ref 8.9–10.3)
Chloride: 104 mmol/L (ref 98–111)
Creatinine, Ser: 0.62 mg/dL (ref 0.44–1.00)
GFR, Estimated: >60 mL/min (ref >60)
Glucose, Bld: 66 mg/dL — ABNORMAL LOW (ref 70–99)
Potassium: 4.3 mmol/L (ref 3.5–5.1)
Sodium: 139 mmol/L (ref 135–145)

## 2020-10-23 LAB — GLUCOSE, CAPILLARY
Glucose-Capillary: 110 mg/dL — ABNORMAL HIGH (ref 70–99)
Glucose-Capillary: 132 mg/dL — ABNORMAL HIGH (ref 70–99)
Glucose-Capillary: 153 mg/dL — ABNORMAL HIGH (ref 70–99)
Glucose-Capillary: 64 mg/dL — ABNORMAL LOW (ref 70–99)
Glucose-Capillary: 98 mg/dL (ref 70–99)

## 2020-10-23 LAB — PREPARE RBC (CROSSMATCH)

## 2020-10-23 LAB — LACTIC ACID, PLASMA: Lactic Acid, Venous: 1.6 mmol/L (ref 0.5–1.9)

## 2020-10-23 MED ORDER — SODIUM CHLORIDE 0.9% IV SOLUTION: Freq: Once | INTRAVENOUS | Status: DC

## 2020-10-23 NOTE — Progress Notes (Signed)
Hypoglycemic Event  CBG: 64 mg/dL @ 0654  Treatment:   4 oz juice/soda  Symptoms: None  Follow-up CBG: Time:to be rechecked   CBG Result:  Possible Reasons for Event: Unknown  Comments/MD notified:no    Babs Sciara

## 2020-10-23 NOTE — Plan of Care (Signed)
  Problem: Health Behavior/Discharge Planning: Goal: Ability to manage health-related needs will improve Outcome: Adequate for Discharge   Problem: Clinical Measurements: Goal: Ability to maintain clinical measurements within normal limits will improve Outcome: Adequate for Discharge Goal: Will remain free from infection Outcome: Adequate for Discharge Goal: Diagnostic test results will improve Outcome: Adequate for Discharge Goal: Respiratory complications will improve Outcome: Adequate for Discharge Goal: Cardiovascular complication will be avoided Outcome: Adequate for Discharge   Problem: Activity: Goal: Risk for activity intolerance will decrease Outcome: Adequate for Discharge   Problem: Elimination: Goal: Will not experience complications related to bowel motility Outcome: Adequate for Discharge Goal: Will not experience complications related to urinary retention Outcome: Adequate for Discharge   Problem: Skin Integrity: Goal: Risk for impaired skin integrity will decrease Outcome: Adequate for Discharge

## 2020-10-23 NOTE — Progress Notes (Signed)
   10/23/20 1917  Assess: MEWS Score  Temp 98.4 F (36.9 C)  BP (!) 119/49  Pulse Rate (!) 111  Resp 18  SpO2 93 %  O2 Device Nasal Cannula  O2 Flow Rate (L/min) 2 L/min  Assess: MEWS Score  MEWS Temp 0  MEWS Systolic 0  MEWS Pulse 2  MEWS RR 0  MEWS LOC 0  MEWS Score 2  MEWS Score Color Yellow  Treat  Pain Scale 0-10  Pain Score 0  Notify: Provider  Provider Name/Title Dr Nichola Sizer  Date Provider Notified 10/23/20  Time Provider Notified 1950  Notification Type Page  Notification Reason Other (Comment)  Provider response See new orders  Date of Provider Response 10/23/20  Time of Provider Response 1945

## 2020-10-23 NOTE — Progress Notes (Signed)
PROGRESS NOTE    Kathy Irwin  VWU:[id number] DOB: May 08, 1950 DOA: 10/04/2020 PCP: Kerin Perna, NP   Chief Complain:  Brief Narrative:  Patient is a 70 year old female with history of CMML, chronic ITP, diastolic heart failure, diabetes, hypertension, presents to the hospital with complaints of shortness of breath.  She was noted to be getting dizzy and breaking out in a sweat when she was standing up.  Hemoglobin noted to be 5.  She was also significantly thrombocytopenic.  CTA chest negative for PE.  She was transfused PRBC and oncology was consulted.  Hospital course remarkable for requiring several units of blood transfusion for persistent anemia.  Oncology following.  Prolonged hospital course  Assessment & Plan:   Active Problems:   Type 2 diabetes mellitus with complication, without long-term current use of insulin (HCC)   HTN (hypertension)   Obesity (BMI 30-39.9)   Chronic diastolic CHF (congestive heart failure) (HCC)   HCAP (healthcare-associated pneumonia)   Hyperbilirubinemia   Idiopathic thrombocytopenia (HCC)   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HCC)   Symptomatic anemia   Symptomatic anemia/pancytopenia, POA, ongoing CMML -Likely related to chronic myelomonocytic leukemia and recent chemotherapy - defer to Oncology -Oncology increased patient's Nplate dose as of 05/26/54 -Hemoglobin of 5.1 on admission - -She has received a total of 8 units prbc thus far -labs improving over the past 48 hours with repeat Nplate administration. -Oncology discussed moving forward with hospice but patient declined - we have  consulted palliative care   Acute on chronic respiratory failure with hypoxia -She is chronically on 2 to 3 L of oxygen -CT chest was negative for pulmonary embolus -CT did comment on possible interstitial lung disease versus pneumonia -She has completed antibiotic course -Currently on prednisone taper -Continue bronchodilators to treat any element  of pulm fibrosis -Overall respiratory status improving   Thrombocytopenia -Likely secondary to above CMML versus chemotherapy, she also has a component of ITP -She received Nplate (Romiplostim) 2/13, repeated 6/28   Coagulopathy -Noted to have elevated INR - previously received vitamin K -INR trending down appropriately   Chronic diastolic congestive heart failure -continue home dose of lasix -continue to monitor volume status -Has lower extremity edema   Diabetes mellitus type 2, well controlled -Held home dose of metformin -Continue on SSI,lantus  -A1c from 4/25 noted to be 6 -Monitor blood sugar    AKI -likely related to IV diuretics -treated with limited IV fluids -now resolved   Hypertension -Continue metoprolol   Splenomegaly -Noted on CT scan, felt to be related to CMML   Generalized weakness -Seen by physical therapy with recommendations for skilled nursing facility -CSW is working on placement   Right hip pain -Patient has a history of right intertrochanteric/subtrochanteric femur fracture in 04/2020 status post intramedullary nail by Dr. Doreatha Martin -She has had many issues since surgery including infection and prolonged antibiotics -She has had continued pain since her fracture -Since she is 5 months out from her fracture, orthopedics had ordered CT of her hip for further evaluation which showed ununited, comminuted right intertrochanteric fracture with intramedullary nail and and screw fixation. Intact hardware without evidence of loosening. We recommend to follow-up with orthopedics as an outpatient.           DVT prophylaxis:SCD Code Status: Full Family Communication: None at bedside Status is: Inpatient    Dispo: The patient is from: Home              Anticipated d/c is to: SNF  Patient currently is not medically stable to d/c.   Difficult to place patient yes      Consultants: Oncology  Procedures:None  Antimicrobials:   Anti-infectives (From admission, onward)    Start     Dose/Rate Route Frequency Ordered Stop   10/15/20 1800  fluconazole (DIFLUCAN) tablet 150 mg        150 mg Oral  Once 10/15/20 1710 10/15/20 1937   10/05/20 1200  cefTRIAXone (ROCEPHIN) 2 g in sodium chloride 0.9 % 100 mL IVPB        2 g 200 mL/hr over 30 Minutes Intravenous Every 24 hours 10/05/20 1144 10/09/20 1235   10/05/20 1145  azithromycin (ZITHROMAX) tablet 500 mg        500 mg Oral Daily 10/05/20 1144 10/09/20 0840       Subjective:  Patient seen and examined the bedside this morning.  Hemodynamically stable.  Sitting on the edge of the bed.  Denies any new complaints today.   Objective: Vitals:   10/22/20 0917 10/22/20 1712 10/22/20 2033 10/23/20 0552  BP: (!) 108/51 (!) 92/52 (!) 99/47 (!) 95/49  Pulse: 81 76 79 93  Resp: _0 Temp: 98 F (36.7 C) 98.3 F (36.8 C) 97.8 F (36.6 C) 98.6 F (37 C)  TempSrc: Tympanic  Oral Oral  SpO2: 99% 97% 100% 92%  Weight:    98.6 kg  Height:        Intake/Output Summary (Last 24 hours) at 10/23/2020 0946 Last data filed at 10/23/2020 0555 Gross per 24 hour  Intake 840 ml  Output 1800 ml  Net -960 ml   Filed Weights   10/14/20 0438 10/21/20 0519 10/23/20 0552  Weight: 102.3 kg 101.3 kg 98.6 kg    Examination:  General exam: Appears calm and comfortable ,Not in distress,obese HEENT:PERRL,Oral mucosa moist, Ear/Nose normal on gross exam Respiratory system: Bilateral equal air entry, normal vesicular breath sounds, no wheezes or crackles  Cardiovascular system: S1 & S2 heard, RRR. No JVD, murmurs, rubs, gallops or clicks. No pedal edema. Gastrointestinal system: Abdomen is nondistended, soft and nontender. No organomegaly or masses felt. Normal bowel sounds heard. Central nervous system: Alert and oriented. No focal neurological deficits. Extremities: Trace bilateral lower extremity edema, no clubbing ,no cyanosis Skin: No rashes, lesions or ulcers,no  icterus     Data Reviewed: I have personally reviewed following labs and imaging studies  CBC: Recent Labs  Lab 10/18/20 0414 10/19/20 0317 10/19/20 0619 10/20/20 0257 10/21/20 0454 10/23/20 0556  WBC 2.0* 2.0*  --  2.6* 2.6* 3.4*  HGB 7.6* 6.8* 6.5* 7.9* 7.5* 6.1*  HCT 23.3* 20.7* 20.0* 24.7* 24.2* 18.6*  MCV 94.3 94.5  --  93.2 95.3 91.6  PLT 19* 18*  --  20* 23* 23*   Basic Metabolic Panel: Recent Labs  Lab 10/18/20 0414 10/19/20 0317 10/20/20 0257 10/21/20 0454 10/23/20 0556  NA 134* 136 137 139 139  K 4.5 4.7 5.1 4.6 4.3  CL 102 103 105 106 104  CO2 _1 GLUCOSE 270* 182* 257* 156* 66*  BUN 27* 29* 33* 26* 32*  CREATININE 0.63 0.60 0.71 0.60 0.62  CALCIUM 9.2 9.3 9.2 9.3 9.1   GFR: Estimated Creatinine Clearance: 74.7 mL/min (by C-G formula based on SCr of 0.62 mg/dL). Liver Function Tests: No results for input(s): AST, ALT, ALKPHOS, BILITOT, PROT, ALBUMIN in the last 168 hours. No results for input(s): LIPASE, AMYLASE in the last 168 hours. No  results for input(s): AMMONIA in the last 168 hours. Coagulation Profile: No results for input(s): INR, PROTIME in the last 168 hours. Cardiac Enzymes: No results for input(s): CKTOTAL, CKMB, CKMBINDEX, TROPONINI in the last 168 hours. BNP (last 3 results) No results for input(s): PROBNP in the last 8760 hours. HbA1C: No results for input(s): HGBA1C in the last 72 hours. CBG: Recent Labs  Lab 10/22/20 1130 10/22/20 1710 10/22/20 2111 10/23/20 0644 10/23/20 0821  GLUCAP 104* 117* 266* 64* 153*   Lipid Profile: No results for input(s): CHOL, HDL, LDLCALC, TRIG, CHOLHDL, LDLDIRECT in the last 72 hours. Thyroid Function Tests: No results for input(s): TSH, T4TOTAL, FREET4, T3FREE, THYROIDAB in the last 72 hours. Anemia Panel: No results for input(s): VITAMINB12, FOLATE, FERRITIN, TIBC, IRON, RETICCTPCT in the last 72 hours. Sepsis Labs: No results for input(s): PROCALCITON, LATICACIDVEN in the  last 168 hours.  No results found for this or any previous visit (from the past 240 hour(s)).       Radiology Studies: No results found.      Scheduled Meds:  sodium chloride   Intravenous Once   sodium chloride   Intravenous Once   sodium chloride   Intravenous Once   sodium chloride   Intravenous Once   diclofenac Sodium  2 g Topical TID AC & HS   furosemide  40 mg Oral Daily   guaiFENesin  600 mg Oral BID   insulin aspart  0-20 Units Subcutaneous TID WC   insulin aspart  0-5 Units Subcutaneous QHS   insulin aspart  3 Units Subcutaneous TID WC   insulin glargine  20 Units Subcutaneous QHS   linaclotide  145 mcg Oral QAC breakfast   metoprolol tartrate  12.5 mg Oral BID   montelukast  10 mg Oral QHS   olopatadine  1 drop Both Eyes BID   predniSONE  20 mg Oral Q breakfast   pregabalin  100 mg Oral QHS   sertraline  100 mg Oral QHS   sodium chloride flush  3 mL Intravenous Q12H   traZODone  100 mg Oral QHS   Continuous Infusions:   LOS: 18 days    Time spent: 25 mins.More than 50% of that time was spent in counseling and/or coordination of care.      Shelly Coss, MD [facility] P7/12/2020, 9:46 AM

## 2020-10-23 NOTE — Progress Notes (Signed)
Date and time results received: 10/23/20    Test: CBC  Critical Value:Hbg 6.1 / Platelet 23  Name of Provider Notified: MD Adhikari  Orders Received? Or Actions Taken?: awaiting response

## 2020-10-23 NOTE — Progress Notes (Signed)
  PT Cancellation Note  Patient Details Name: Jolonda Gomm MRN: [id number] DOB: 01-Feb-1951   Cancelled Treatment:    Reason Eval/Treat Not Completed: Medical issues which prohibited therapy. Noted hemoglobin down to 6.1 and awaiting transfusion.  Spoke to RN who states that will happen this afternoon.  Will hold PT for today as DC does not look imminent and given her profound weakness she would likely not do well with PT with such a low hemoglobin.   Melvern Banker 10/23/2020, 11:29 AM Lavonia Dana, PT   Acute Rehabilitation Services  Pager [phone] Office [phone] 10/23/2020

## 2020-10-23 NOTE — Plan of Care (Signed)
  Problem: Health Behavior/Discharge Planning: Goal: Ability to manage health-related needs will improve Outcome: Progressing   Problem: Clinical Measurements: Goal: Ability to maintain clinical measurements within normal limits will improve Outcome: Progressing   

## 2020-10-23 NOTE — Progress Notes (Deleted)
Results for ANJELICA, GORNIAK (MRN [id number]) as of 10/23/2020 07:44  Ref. Range 10/22/2020 11:30 10/22/2020 17:10 10/22/2020 21:11 10/23/2020 05:56 10/23/2020 06:44  WBC Latest Ref Range: 4.0 - 10.5 K/uL    3.4 (L)   RBC Latest Ref Range: 3.87 - 5.11 MIL/uL    2.03 (L)   Hemoglobin Latest Ref Range: 12.0 - 15.0 g/dL    6.1 (LL)   HCT Latest Ref Range: 36.0 - 46.0 %    18.6 (L)   MCV Latest Ref Range: 80.0 - 100.0 fL    91.6   MCH Latest Ref Range: 26.0 - 34.0 pg    30.0   MCHC Latest Ref Range: 30.0 - 36.0 g/dL    32.8   RDW Latest Ref Range: 11.5 - 15.5 %    16.0 (H)   Platelets Latest Ref Range: 150 - 400 K/uL    23 (LL)   nRBC Latest Ref Range: 0.0 - 0.2 %    11.1 (H)   Glucose Latest Ref Range: 70 - 99 mg/dL    66 (L)    Paged MD Adhikari and made aware of results.

## 2020-10-24 ENCOUNTER — Other Ambulatory Visit (INDEPENDENT_AMBULATORY_CARE_PROVIDER_SITE_OTHER): Payer: Self-pay | Admitting: Primary Care

## 2020-10-24 DIAGNOSIS — C9310 Chronic myelomonocytic leukemia not having achieved remission: Secondary | ICD-10-CM | POA: Diagnosis not present

## 2020-10-24 LAB — CBC WITH DIFFERENTIAL/PLATELET
Abs Immature Granulocytes: 0.05 10*3/uL (ref 0.00–0.07)
Basophils Absolute: 0 10*3/uL (ref 0.0–0.1)
Basophils Relative: 0 %
Eosinophils Absolute: 0 10*3/uL (ref 0.0–0.5)
Eosinophils Relative: 0 %
HCT: 20 % — ABNORMAL LOW (ref 36.0–46.0)
Hemoglobin: 6.5 g/dL — CL (ref 12.0–15.0)
Immature Granulocytes: 1 %
Lymphocytes Relative: 50 %
Lymphs Abs: 2.2 10*3/uL (ref 0.7–4.0)
MCH: 29.5 pg (ref 26.0–34.0)
MCHC: 32.5 g/dL (ref 30.0–36.0)
MCV: 90.9 fL (ref 80.0–100.0)
Monocytes Absolute: 1.5 10*3/uL — ABNORMAL HIGH (ref 0.1–1.0)
Monocytes Relative: 33 %
Neutro Abs: 0.7 10*3/uL — ABNORMAL LOW (ref 1.7–7.7)
Neutrophils Relative %: 16 %
Platelets: 20 10*3/uL — CL (ref 150–400)
RBC: 2.2 MIL/uL — ABNORMAL LOW (ref 3.87–5.11)
RDW: 15.5 % (ref 11.5–15.5)
WBC: 4.4 10*3/uL (ref 4.0–10.5)
nRBC: 12.7 % — ABNORMAL HIGH (ref 0.0–0.2)

## 2020-10-24 LAB — GLUCOSE, CAPILLARY
Glucose-Capillary: 86 mg/dL (ref 70–99)
Glucose-Capillary: 83 mg/dL (ref 70–99)
Glucose-Capillary: 149 mg/dL — ABNORMAL HIGH (ref 70–99)
Glucose-Capillary: 92 mg/dL (ref 70–99)

## 2020-10-24 LAB — PREPARE RBC (CROSSMATCH)

## 2020-10-24 LAB — LACTIC ACID, PLASMA: Lactic Acid, Venous: 1.3 mmol/L (ref 0.5–1.9)

## 2020-10-24 MED ORDER — SODIUM CHLORIDE 0.9% IV SOLUTION: Freq: Once | INTRAVENOUS | Status: AC

## 2020-10-24 MED ORDER — SODIUM CHLORIDE 0.9% IV SOLUTION: Freq: Once | INTRAVENOUS | Status: DC

## 2020-10-24 MED ORDER — PREDNISONE 10 MG PO TABS
10.0000 mg | ORAL_TABLET | Freq: Every day | ORAL | Status: AC
  Administered 2020-10-25 – 2020-10-26 (×2): 10 mg via ORAL
  Filled 2020-10-24 (×2): qty 1

## 2020-10-24 NOTE — Telephone Encounter (Signed)
Requested medication (s) are due for refill today: yes  Requested medication (s) are on the active medication list: yes  Last refill:  07/04/20 18.2 ml 1 refill  Future visit scheduled: no  Notes to clinic:  do you want to refill for #16 ml instead of  #18.2 ml and 1 refill?     Requested Prescriptions  Pending Prescriptions Disp Refills   fluticasone (FLONASE) 50 MCG/ACT nasal spray [Pharmacy Med Name: FLUTICASONE PROP 50 MCG SPRAY] 16 mL 1    Sig: PLACE 2 SPRAYS INTO BOTH NOSTRILS DAILY AS NEEDED FOR ALLERGIES OR RHINITIS.      Ear, Nose, and Throat: Nasal Preparations - Corticosteroids Passed - 10/24/2020  1:30 PM      Passed - Valid encounter within last 12 months    Recent Outpatient Visits           1 month ago Mixed hyperlipidemia   [place] Juluis Mire P, NP   2 months ago Type 2 diabetes mellitus without complication, without long-term current use of insulin ([place])   [place], Michelle P, NP   6 months ago OAB (overactive bladder)   [facility] Juluis Mire P, NP   8 months ago Chronic respiratory failure with hypoxia ([place])   [place] Juluis Mire P, NP   8 months ago Type 2 diabetes mellitus without complication, without long-term current use of insulin ([place])   [place] Kerin Perna, NP

## 2020-10-24 NOTE — Progress Notes (Signed)
CRITICAL VALUE STICKER  CRITICAL VALUE: Hgb 6.5  RECEIVER: Eliezer Lofts, RN  DATE & TIME NOTIFIED: 3:41 AM   MESSENGER : Lab  MD NOTIFIED: Hal Hope, MD  TIME OF NOTIFICATION: 5500  RESPONSE: Transfusion ordered

## 2020-10-24 NOTE — Progress Notes (Signed)
10/23/20 2346  Assess: MEWS Score  Temp (!) 100.4 F (38 C)  BP (!) 96/37  Pulse Rate (!) 105  Resp 20  SpO2 99 %  O2 Device Nasal Cannula  O2 Flow Rate (L/min) 3 L/min  Assess: MEWS Score  MEWS Temp 0  MEWS Systolic 1  MEWS Pulse 1  MEWS RR 0  MEWS LOC 0  MEWS Score 2  MEWS Score Color Yellow  Assess: if the MEWS score is Yellow or Red  Were vital signs taken at a resting state? Yes  Focused Assessment No change from prior assessment  Early Detection of Sepsis Score *See Row Information* Medium  MEWS guidelines implemented *See Row Information* Yes  Treat  MEWS Interventions Administered prn meds/treatments  Pain Scale 0-10  Pain Score 0  Take Vital Signs  Increase Vital Sign Frequency  Yellow: Q 2hr X 2 then Q 4hr X 2, if remains yellow, continue Q 4hrs  Escalate  MEWS: Escalate Yellow: discuss with charge nurse/RN and consider discussing with provider and RRT  Notify: Charge Nurse/RN  Name of Charge Nurse/RN Notified Etheline Geppert, RN  Date Charge Nurse/RN Notified 10/23/20  Time Charge Nurse/RN Notified 2346  Document  Patient Outcome Stabilized after interventions  Progress note created (see row info) Yes

## 2020-10-24 NOTE — Progress Notes (Signed)
PROGRESS NOTE    Kathy Irwin  ENI:[id number] DOB: [birth year] DOA: 10/04/2020 PCP: Kerin Perna, NP   Chief Complain:  Brief Narrative:  Patient is a 70 year old female with history of CMML, chronic ITP, diastolic heart failure, diabetes, hypertension, presents to the hospital with complaints of shortness of breath.  As per the report,he was noted to be getting dizzy and breaking out in a sweat when she was standing up.  Hemoglobin noted to be 5 on admission.  She was also significantly thrombocytopenic.  CTA chest negative for PE.  She was transfused PRBC and oncology was consulted.  Hospital course remarkable for requiring several units of blood transfusion for persistent anemia.  Oncology following and recommended hospice approach given her significant burden of oncology cares and limited likelihood of immediate improvement of her bone marrow functioning.  Patient declined hospice and wants to continue transfusion and Vidaza/Nplate therapy..  Prolonged hospital course requiring frequent transfusion for low hemoglobin.  PT/OT recommending skilled nursing facility but not sure when she is stable for discharge because of frequent transfusion requirement  Assessment & Plan:   Active Problems:   Type 2 diabetes mellitus with complication, without long-term current use of insulin (HCC)   HTN (hypertension)   Obesity (BMI 30-39.9)   Chronic diastolic CHF (congestive heart failure) (HCC)   HCAP (healthcare-associated pneumonia)   Hyperbilirubinemia   Idiopathic thrombocytopenia (HCC)   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HCC)   Symptomatic anemia   Symptomatic anemia/pancytopenia, -Secondary to  chronic myelomonocytic leukemia, chemotherapy, chronic ITP -Hemoglobin of 5.1 on admission  -She has received a total of 10 units prbc thus far  -Transfuse if hemoglobin less than 7.5 -Oncology discussed moving forward with hospice but patient declined - we have  consulted palliative  care  CMML -Status post 2 cycles of 5 azacitidine -Since she declined hospice approach, oncology recommended to follow-up as an outpatient for rescheduling her cycle 3 of Vidaza, weekly Nplate  Thrombocytopenia -Likely secondary to above CMML versus chemotherapy, she also has a component of ITP -She received Nplate (Romiplostim) 6/14, repeated 6/28 -Transfuse with platelets if less than 10K or active bleeding   Acute on chronic respiratory failure with hypoxia -She is chronically on 2 to 3 L of oxygen -CT chest was negative for pulmonary embolus -CT did comment on possible interstitial lung disease versus pneumonia -She has completed antibiotic course -Currently on prednisone taper -Continue bronchodilators as needed -Currently respiratory status is stable.   Coagulopathy -Noted to have elevated INR - previously received vitamin K -INR ok now   Chronic diastolic congestive heart failure -continue home dose of lasix -continue to monitor volume status -Has lower extremity edema   Diabetes mellitus type 2, well controlled -Takes  metformin at home -Continue on SSI,lantus  -A1c from 4/25 noted to be 6 -Monitor blood sugar   Hypertension -Continue metoprolol   Splenomegaly -Noted on CT scan, felt to be related to CMML   Generalized weakness -Seen by physical therapy with recommendations for skilled nursing facility -CSW is working on placement  Morbid obesity -BMI 37.9   Right hip pain -Patient has a history of right intertrochanteric/subtrochanteric femur fracture in 04/2020 status post intramedullary nail by Dr. Doreatha Martin -She has had many issues since surgery including infection and prolonged antibiotics -She has had continued pain since her fracture -Since she is 5 months out from her fracture, orthopedics had ordered CT of her hip for further evaluation which showed ununited, comminuted right intertrochanteric fracture with intramedullary nail and and  screw fixation.  Intact hardware without evidence of loosening. -We recommend to follow-up with orthopedics as an outpatient. --She is currently ambulatory           DVT prophylaxis:SCD Code Status: Full Family Communication: None at bedside Status is: Inpatient    Dispo: The patient is from: Home              Anticipated d/c is to: SNF              Patient currently is not medically stable to d/c.   Difficult to place patient yes      Consultants: Oncology  Procedures:None  Antimicrobials:  Anti-infectives (From admission, onward)    Start     Dose/Rate Route Frequency Ordered Stop   10/15/20 1800  fluconazole (DIFLUCAN) tablet 150 mg        150 mg Oral  Once 10/15/20 1710 10/15/20 1937   10/05/20 1200  cefTRIAXone (ROCEPHIN) 2 g in sodium chloride 0.9 % 100 mL IVPB        2 g 200 mL/hr over 30 Minutes Intravenous Every 24 hours 10/05/20 1144 10/09/20 1235   10/05/20 1145  azithromycin (ZITHROMAX) tablet 500 mg        500 mg Oral Daily 10/05/20 1144 10/09/20 0840       Subjective:  Patient seen and examined the bedside this morning.  She looked comfortable, sitting at the edge of the bed.  Was not in any kind of distress.  We again discussed about the idea of hospice but patient was not that interested and states she will think about it.  She denies any complaints today.   Objective: Vitals:   10/24/20 0454 10/24/20 0736 10/24/20 1045 10/24/20 1122  BP:  (!) 97/42 (!) 100/55 (!) 107/52  Pulse:  86 97 (!) 101  Resp:  _0 Temp:  97.9 F (36.6 C) (!) 100.4 F (38 C) 98.7 F (37.1 C)  TempSrc:  Oral Oral Oral  SpO2:  100% 100% 100%  Weight: 100.2 kg     Height:        Intake/Output Summary (Last 24 hours) at 10/24/2020 1255 Last data filed at 10/24/2020 0933 Gross per 24 hour  Intake 1750 ml  Output 800 ml  Net 950 ml   Filed Weights   10/21/20 0519 10/23/20 0552 10/24/20 0454  Weight: 101.3 kg 98.6 kg 100.2 kg    Examination:  General exam: Overall  comfortable, not in distress, morbidly obese HEENT: PERRL Respiratory system:  no wheezes or crackles  Cardiovascular system: S1 & S2 heard, RRR.  Gastrointestinal system: Abdomen is nondistended, soft and nontender. Central nervous system: Alert and oriented Extremities: Trace bilateral lower extremity edema, no clubbing ,no cyanosis Skin: No rashes, no ulcers,no icterus    Data Reviewed: I have personally reviewed following labs and imaging studies  CBC: Recent Labs  Lab 10/20/20 0257 10/21/20 0454 10/23/20 0556 10/23/20 2222 10/24/20 0214  WBC 2.6* 2.6* 3.4* 4.0 4.4  NEUTROABS  --   --   --   --  0.7*  HGB 7.9* 7.5* 6.1* 7.2* 6.5*  HCT 24.7* 24.2* 18.6* 21.1* 20.0*  MCV 93.2 95.3 91.6 90.2 90.9  PLT 20* 23* 23* 22* 20*   Basic Metabolic Panel: Recent Labs  Lab 10/18/20 0414 10/19/20 0317 10/20/20 0257 10/21/20 0454 10/23/20 0556  NA 134* 136 137 139 139  K 4.5 4.7 5.1 4.6 4.3  CL 102 103 105 106 104  CO2 28 30 27  25 30  GLUCOSE 270* 182* 257* 156* 66*  BUN 27* 29* 33* 26* 32*  CREATININE 0.63 0.60 0.71 0.60 0.62  CALCIUM 9.2 9.3 9.2 9.3 9.1   GFR: Estimated Creatinine Clearance: 75.3 mL/min (by C-G formula based on SCr of 0.62 mg/dL). Liver Function Tests: No results for input(s): AST, ALT, ALKPHOS, BILITOT, PROT, ALBUMIN in the last 168 hours. No results for input(s): LIPASE, AMYLASE in the last 168 hours. No results for input(s): AMMONIA in the last 168 hours. Coagulation Profile: No results for input(s): INR, PROTIME in the last 168 hours. Cardiac Enzymes: No results for input(s): CKTOTAL, CKMB, CKMBINDEX, TROPONINI in the last 168 hours. BNP (last 3 results) No results for input(s): PROBNP in the last 8760 hours. HbA1C: No results for input(s): HGBA1C in the last 72 hours. CBG: Recent Labs  Lab 10/23/20 1138 10/23/20 1625 10/23/20 2344 10/24/20 0619 10/24/20 1131  GLUCAP 110* 132* 98 86 83   Lipid Profile: No results for input(s): CHOL, HDL,  LDLCALC, TRIG, CHOLHDL, LDLDIRECT in the last 72 hours. Thyroid Function Tests: No results for input(s): TSH, T4TOTAL, FREET4, T3FREE, THYROIDAB in the last 72 hours. Anemia Panel: No results for input(s): VITAMINB12, FOLATE, FERRITIN, TIBC, IRON, RETICCTPCT in the last 72 hours. Sepsis Labs: Recent Labs  Lab 10/23/20 2222 10/24/20 0214  LATICACIDVEN 1.6 1.3    Recent Results (from the past 240 hour(s))  Culture, blood (routine x 2)     Status: None (Preliminary result)   Collection Time: 10/23/20 10:22 PM   Specimen: BLOOD  Result Value Ref Range Status   Specimen Description BLOOD RIGHT ANTECUBITAL  Final   Special Requests   Final    BOTTLES DRAWN AEROBIC AND ANAEROBIC Blood Culture adequate volume   Culture   Final    NO GROWTH < 12 HOURS Performed at [facility], [place] [address] [place], [place] [address]    Report Status PENDING  Incomplete  Culture, blood (routine x 2)     Status: None (Preliminary result)   Collection Time: 10/23/20 10:22 PM   Specimen: BLOOD RIGHT HAND  Result Value Ref Range Status   Specimen Description BLOOD RIGHT HAND  Final   Special Requests   Final    BOTTLES DRAWN AEROBIC ONLY Blood Culture adequate volume   Culture   Final    NO GROWTH < 12 HOURS Performed at [facility], [place] [address], [place], [place] [address]    Report Status PENDING  Incomplete         Radiology Studies: No results found.      Scheduled Meds:  sodium chloride   Intravenous Once   sodium chloride   Intravenous Once   diclofenac Sodium  2 g Topical TID AC & HS   furosemide  40 mg Oral Daily   guaiFENesin  600 mg Oral BID   insulin aspart  0-20 Units Subcutaneous TID WC   insulin aspart  0-5 Units Subcutaneous QHS   insulin aspart  3 Units Subcutaneous TID WC   insulin glargine  20 Units Subcutaneous QHS   linaclotide  145 mcg Oral QAC breakfast   metoprolol tartrate  12.5 mg Oral BID   montelukast  10 mg Oral QHS   olopatadine  1 drop  Both Eyes BID   predniSONE  20 mg Oral Q breakfast   pregabalin  100 mg Oral QHS   sertraline  100 mg Oral QHS   sodium chloride flush  3 mL Intravenous Q12H  traZODone  100 mg Oral QHS   Continuous Infusions:   LOS: 19 days    Time spent: 25 mins.More than 50% of that time was spent in counseling and/or coordination of care.      Shelly Coss, MD [facility] P7/01/2021, 12:55 PM

## 2020-10-24 NOTE — Progress Notes (Signed)
   10/23/20 2149  Assess: MEWS Score  Temp (!) 100.4 F (38 C)  BP (!) 100/46  Pulse Rate (!) 117  Resp (!) 22  SpO2 94 %  O2 Device Nasal Cannula  O2 Flow Rate (L/min) 3 L/min  Assess: MEWS Score  MEWS Temp 0  MEWS Systolic 1  MEWS Pulse 2  MEWS RR 1  MEWS LOC 0  MEWS Score 4  MEWS Score Color Red  Assess: if the MEWS score is Yellow or Red  Were vital signs taken at a resting state? Yes  Focused Assessment Change from prior assessment (see assessment flowsheet)  Early Detection of Sepsis Score *See Row Information* Medium  MEWS guidelines implemented *See Row Information* Yes  Treat  MEWS Interventions Escalated (See documentation below)  Pain Scale 0-10  Pain Score 5  Pain Location Hip  Pain Orientation Right  Take Vital Signs  Increase Vital Sign Frequency  Red: Q 1hr X 4 then Q 4hr X 4, if remains red, continue Q 4hrs  Escalate  MEWS: Escalate Red: discuss with charge nurse/RN and provider, consider discussing with RRT  Notify: Charge Nurse/RN  Name of Charge Nurse/RN Notified Elaysha Bevard , RN  Date Charge Nurse/RN Notified 10/23/20  Time Charge Nurse/RN Notified 2149  Notify: Provider  Provider Name/Title Hal Hope  Date Provider Notified 10/23/20  Time Provider Notified 2150  Notification Type Page  Notification Reason Change in status  Provider response See new orders  Date of Provider Response 10/23/20  Document  Patient Outcome Not stable and remains on department  Progress note created (see row info) Yes

## 2020-10-24 NOTE — Progress Notes (Signed)
Physical Therapy Treatment Patient Details Name: Kathy Irwin MRN: [id number] DOB: 25-Dec-1950 Today's Date: 10/24/2020    History of Present Illness 70 y.o. female presents to ED 10/04/20 with continued complaints of SoB and chest tightness. Presented to [facility] with similar complaints 6/17 In ED  HGB 5.1 and received 2 units of PRBC Chest x-ray noted improving bibasilar infiltrates compared to x-ray from 6/17 CT of the chest performed which was negative for PE but did show increase in her splenomegaly. 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  She last had received infusion with romiplostim on 6/14.    PT Comments    Continuing work on functional mobility and activity tolerance;  Session focused on a close look at activity tolerance, and pt fatigues rapidly; Often drifting into eyes shut, but arouses to voice, and at times answers questions and interacts with her eyes still closed; Overall min/might mod assist fro bed mobility and transfers; BPs soft with activity, with noted MAPs of 60 after getting up;   Pt lives alone and I'm concerned re: her safety and ability to manage at home as she profoundly fatigues with transfers; unable to amb; I'm aware of insurance difficulties with insurance for going SNF; If she simply cannot go to post-acute rehab, we will need to look into options for more home assistance   Follow Up Recommendations  SNF     Equipment Recommendations  Other (comment) (If she has no other choice than to dc home, recommend WC with cushion, in additiion to what she already has)    Recommendations for Other Services       Precautions / Restrictions Precautions Precautions: Fall (Pt reports 1 fall in the past 6 months) Precaution Comments: watch O2; soft BPs    Mobility  Bed Mobility Overal bed mobility: Needs Assistance Bed  Mobility: Supine to Sit     Supine to sit: Mod assist     General bed mobility comments: Light mod assist and use of bed pad to square off hips at eOB    Transfers Overall transfer level: Needs assistance Equipment used: Rolling walker (2 wheeled);2 person hand held assist Transfers: Sit to/from [facility] Sit to Stand: Min assist Stand pivot transfers: Mod assist       General transfer comment: Light mod assist to power up and steady; fatigues quickly, and needed light mod assist to turn to recliner and control descent to sit  Ambulation/Gait             General Gait Details: Deferred due to significant fatigue, difficulty keeping eyes open   Stairs             Wheelchair Mobility    Modified Rankin (Stroke Patients Only)       Balance                                            Cognition Arousal/Alertness: Awake/alert (but very sleepy, and drifts into eyes closed easily) Behavior During Therapy: [facility] for tasks assessed/performed Overall Cognitive Status: Within Functional Limits for tasks assessed  Exercises      General Comments General comments (skin integrity, edema, etc.): Session conducted on 2 L suplemental O2; soft BPs sitting up in recliner after transfer OOB; see doc flowsheets; RN aware      Pertinent Vitals/Pain Pain Assessment: Faces Faces Pain Scale: Hurts little more Pain Location: r hip Pain Descriptors / Indicators: Aching Pain Intervention(s): Monitored during session;Repositioned;Other (comment) (applied voltaren cream)    Home Living                      Prior Function            PT Goals (current goals can now be found in the care plan section) Acute Rehab PT Goals Patient Stated Goal: get stronger, and be able to safely get home PT Goal Formulation: With patient Time For Goal Achievement: 11/07/20 Progress towards PT  goals: Progressing toward goals (slowly; weakness and tremor still present)    Frequency    Min 3X/week      PT Plan Current plan remains appropriate    Co-evaluation              AM-PAC PT "6 Clicks" Mobility   Outcome Measure  Help needed turning from your back to your side while in a flat bed without using bedrails?: None Help needed moving from lying on your back to sitting on the side of a flat bed without using bedrails?: A Little Help needed moving to and from a bed to a chair (including a wheelchair)?: A Lot Help needed standing up from a chair using your arms (e.g., wheelchair or bedside chair)?: A Lot Help needed to walk in hospital room?: A Lot Help needed climbing 3-5 steps with a railing? : Total 6 Click Score: 14    End of Session Equipment Utilized During Treatment: Gait belt;Oxygen Activity Tolerance: Patient limited by fatigue;Patient limited by pain Patient left: in chair;with call bell/phone within reach;with chair alarm set Nurse Communication: Mobility status;Other (comment) (low BPs) PT Visit Diagnosis: Other abnormalities of gait and mobility (R26.89);Muscle weakness (generalized) (M62.81);Difficulty in walking, not elsewhere classified (R26.2);Pain Pain - Right/Left: Right Pain - part of body: Hip     Time: 0277-4128 PT Time Calculation (min) (ACUTE ONLY): 34 min  Charges:  $Therapeutic Activity: 23-37 mins                     Roney Marion, PT  Acute Rehabilitation Services Pager [phone] Office [phone]    Kathy Irwin 10/24/2020, 4:01 PM

## 2020-10-25 ENCOUNTER — Inpatient Hospital Stay: Payer: 59

## 2020-10-25 DIAGNOSIS — Z515 Encounter for palliative care: Secondary | ICD-10-CM

## 2020-10-25 DIAGNOSIS — C9310 Chronic myelomonocytic leukemia not having achieved remission: Secondary | ICD-10-CM | POA: Diagnosis not present

## 2020-10-25 DIAGNOSIS — Z7189 Other specified counseling: Secondary | ICD-10-CM | POA: Diagnosis not present

## 2020-10-25 LAB — CBC
HCT: 23.6 % — ABNORMAL LOW (ref 36.0–46.0)
Hemoglobin: 7.9 g/dL — ABNORMAL LOW (ref 12.0–15.0)
MCH: 29.2 pg (ref 26.0–34.0)
MCHC: 33.5 g/dL (ref 30.0–36.0)
MCV: 87.1 fL (ref 80.0–100.0)
Platelets: 19 10*3/uL — CL (ref 150–400)
RBC: 2.71 MIL/uL — ABNORMAL LOW (ref 3.87–5.11)
RDW: 17.2 % — ABNORMAL HIGH (ref 11.5–15.5)
WBC: 2.9 10*3/uL — ABNORMAL LOW (ref 4.0–10.5)
nRBC: 11.7 % — ABNORMAL HIGH (ref 0.0–0.2)

## 2020-10-25 LAB — GLUCOSE, CAPILLARY
Glucose-Capillary: 68 mg/dL — ABNORMAL LOW (ref 70–99)
Glucose-Capillary: 146 mg/dL — ABNORMAL HIGH (ref 70–99)
Glucose-Capillary: 100 mg/dL — ABNORMAL HIGH (ref 70–99)
Glucose-Capillary: 144 mg/dL — ABNORMAL HIGH (ref 70–99)
Glucose-Capillary: 242 mg/dL — ABNORMAL HIGH (ref 70–99)

## 2020-10-25 LAB — BASIC METABOLIC PANEL
Anion gap: 6 (ref 5–15)
BUN: 35 mg/dL — ABNORMAL HIGH (ref 8–23)
CO2: 27 mmol/L (ref 22–32)
Calcium: 8.8 mg/dL — ABNORMAL LOW (ref 8.9–10.3)
Chloride: 103 mmol/L (ref 98–111)
Creatinine, Ser: 0.71 mg/dL (ref 0.44–1.00)
GFR, Estimated: >60 mL/min (ref >60)
Glucose, Bld: 71 mg/dL (ref 70–99)
Potassium: 4.4 mmol/L (ref 3.5–5.1)
Sodium: 136 mmol/L (ref 135–145)

## 2020-10-25 MED ORDER — TRAMADOL HCL 50 MG PO TABS
50.0000 mg | ORAL_TABLET | Freq: Four times a day (QID) | ORAL | Status: DC | PRN
  Administered 2020-10-26 – 2020-10-28 (×5): 50 mg via ORAL
  Filled 2020-10-25 (×5): qty 1

## 2020-10-25 MED ORDER — INSULIN GLARGINE 100 UNIT/ML ~~LOC~~ SOLN
14.0000 [IU] | Freq: Every day | SUBCUTANEOUS | Status: DC
  Filled 2020-10-25 (×2): qty 0.14

## 2020-10-25 NOTE — Progress Notes (Signed)
Inpatient Diabetes Program Recommendations  AACE/ADA: New Consensus Statement on Inpatient Glycemic Control (2015)  Target Ranges:  Prepandial:   less than 140 mg/dL      Peak postprandial:   less than 180 mg/dL (1-2 hours)      Critically ill patients:  140 - 180 mg/dL   Lab Results  Component Value Date   GLUCAP 100 (H) 10/25/2020   HGBA1C 6.0 (A) 08/09/2020    Review of Glycemic Control Results for Kathy Irwin, Kathy Irwin (MRN [id number]) as of 10/25/2020 15:40  Ref. Range 10/24/2020 22:12 10/25/2020 07:17 10/25/2020 08:05 10/25/2020 11:24  Glucose-Capillary Latest Ref Range: 70 - 99 mg/dL 92 68 (L) 146 (H) 100 (H)   Diabetes history: DM2 Outpatient Diabetes medications: Metformin 1000 mg QAM Current orders for Inpatient glycemic control: Novolog 0-20 units TID & HS, Lantus 20 units QHS; Novolog 3 units TID Prednisone 10 mg QD   Inpatient Diabetes Program Recommendations:     Given steroid taper, consider reducing Lantus to 15 units QHS and reduce correction to Novolog 0-9 units TID.   Thanks, Bronson Curb, MSN, RNC-OB Diabetes Coordinator [phone] (8a-5p)

## 2020-10-25 NOTE — Progress Notes (Signed)
Patient and family requests for pt to not get Hydrocodone d/t to allergy to it, MD notified of the request, new order obtained to d/c pain med and added to allergy list.

## 2020-10-25 NOTE — Progress Notes (Signed)
PROGRESS NOTE    Kathy Irwin  GEX:[id number] DOB: [birth year] DOA: 10/04/2020 PCP: Kerin Perna, NP   Chief Complain:  Brief Narrative:  Patient is a 70 year old female with history of CMML, chronic ITP, diastolic heart failure, diabetes, hypertension, presents to the hospital with complaints of shortness of breath.  As per the report,he was noted to be getting dizzy and breaking out in a sweat when she was standing up.  Hemoglobin noted to be 5 on admission.  She was also significantly thrombocytopenic.  CTA chest negative for PE.  She was transfused PRBC and oncology was consulted.  Hospital course remarkable for requiring several units of blood transfusion for persistent anemia.  Oncology following and recommended hospice approach given her significant burden of oncology cares and limited likelihood of immediate improvement of her bone marrow functioning.  Patient declined hospice and wants to continue transfusion and Vidaza/Nplate therapy..  Prolonged hospital course requiring frequent transfusion for low hemoglobin.  PT/OT recommending skilled nursing facility but not sure when she is stable for discharge because of frequent transfusion requirement.  Palliative care also following.  Assessment & Plan:   Active Problems:   Type 2 diabetes mellitus with complication, without long-term current use of insulin (HCC)   HTN (hypertension)   Obesity (BMI 30-39.9)   Chronic diastolic CHF (congestive heart failure) (HCC)   HCAP (healthcare-associated pneumonia)   Hyperbilirubinemia   Idiopathic thrombocytopenia (HCC)   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HCC)   Symptomatic anemia   Symptomatic anemia/pancytopenia, -Secondary to  chronic myelomonocytic leukemia, chemotherapy, chronic ITP -Hemoglobin of 5.1 on admission  -She has received a total of 10 units prbc thus far  -Transfuse if hemoglobin less than 7.5 -Oncology discussed moving forward with hospice but patient declined -  we have  consulted palliative care. -Palliative care discussed with the patient and she wants to remain full code, continue full scope treatment, palliative care  recommended skilled nursing facility with palliative care service follow-up.  CMML -Status post 2 cycles of 5 azacitidine -Since she declined hospice approach, oncology recommended to follow-up as an outpatient for rescheduling her cycle 3 of Vidaza, weekly Nplate  Thrombocytopenia -Likely secondary to above CMML versus chemotherapy, she also has a component of ITP -She received Nplate (Romiplostim) 0/10, repeated 6/28 -Transfuse with platelets if less than 10K or active bleeding   Acute on chronic respiratory failure with hypoxia -She is chronically on 2 to 3 L of oxygen -CT chest was negative for pulmonary embolus -CT did comment on possible interstitial lung disease versus pneumonia -She has completed antibiotic course -Currently on prednisone taper -Continue bronchodilators as needed -Currently respiratory status is stable.   Coagulopathy -Noted to have elevated INR - previously received vitamin K -INR ok now   Chronic diastolic congestive heart failure -continue home dose of lasix -continue to monitor volume status -Has lower extremity edema   Diabetes mellitus type 2, well controlled -Takes  metformin at home -Continue on SSI,lantus  -A1c from 4/25 noted to be 6 -Monitor blood sugar   Hypertension -Continue metoprolol   Splenomegaly -Noted on CT scan, felt to be related to CMML   Generalized weakness -Seen by physical therapy with recommendations for skilled nursing facility -CSW is working on placement  Morbid obesity -BMI 37.9   Right hip pain -Patient has a history of right intertrochanteric/subtrochanteric femur fracture in 04/2020 status post intramedullary nail by Dr. Doreatha Martin -She has had many issues since surgery including infection and prolonged antibiotics -She has had continued pain  since  her fracture -Since she is 5 months out from her fracture, orthopedics had ordered CT of her hip for further evaluation which showed ununited, comminuted right intertrochanteric fracture with intramedullary nail and and screw fixation. Intact hardware without evidence of loosening. -We recommend to follow-up with orthopedics as an outpatient. --She is currently ambulatory           DVT prophylaxis:SCD Code Status: Full Family Communication: None at bedside Status is: Inpatient    Dispo: The patient is from: Home              Anticipated d/c is to: SNF              Patient currently is not medically stable to d/c.   Difficult to place patient yes      Consultants: Oncology  Procedures:None  Antimicrobials:  Anti-infectives (From admission, onward)    Start     Dose/Rate Route Frequency Ordered Stop   10/15/20 1800  fluconazole (DIFLUCAN) tablet 150 mg        150 mg Oral  Once 10/15/20 1710 10/15/20 1937   10/05/20 1200  cefTRIAXone (ROCEPHIN) 2 g in sodium chloride 0.9 % 100 mL IVPB        2 g 200 mL/hr over 30 Minutes Intravenous Every 24 hours 10/05/20 1144 10/09/20 1235   10/05/20 1145  azithromycin (ZITHROMAX) tablet 500 mg        500 mg Oral Daily 10/05/20 1144 10/09/20 0840       Subjective:  Patient seen and examined the bedside this morning.  Medically stable.  Denies any complaints today.  She looks more comfortable today.  She was asking about her today's hemoglobin.  Objective: Vitals:   10/24/20 2004 10/25/20 0649 10/25/20 0650 10/25/20 0913  BP: (!) 95/50 (!) 102/54  (!) 108/47  Pulse: 80 87  95  Resp: _0 Temp: 98.1 F (36.7 C) (!) 97.5 F (36.4 C)  97.9 F (36.6 C)  TempSrc: Oral Oral  Oral  SpO2: 100% 100%  98%  Weight:   98.6 kg   Height:        Intake/Output Summary (Last 24 hours) at 10/25/2020 1252 Last data filed at 10/25/2020 1200 Gross per 24 hour  Intake 1384 ml  Output 2550 ml  Net -1166 ml   Filed Weights    10/23/20 0552 10/24/20 0454 10/25/20 0650  Weight: 98.6 kg 100.2 kg 98.6 kg    Examination:    General exam: Overall comfortable, not in distress, morbidly obese HEENT: PERRL Respiratory system:  no wheezes or crackles  Cardiovascular system: S1 & S2 heard, RRR.  Gastrointestinal system: Abdomen is nondistended, soft and nontender. Central nervous system: Alert and oriented Extremities: Trace bilateral lower extremity edema, no clubbing ,no cyanosis Skin: No rashes, no ulcers,no icterus    Data Reviewed: I have personally reviewed following labs and imaging studies  CBC: Recent Labs  Lab 10/21/20 0454 10/23/20 0556 10/23/20 2222 10/24/20 0214 10/25/20 0658  WBC 2.6* 3.4* 4.0 4.4 2.9*  NEUTROABS  --   --   --  0.7*  --   HGB 7.5* 6.1* 7.2* 6.5* 7.9*  HCT 24.2* 18.6* 21.1* 20.0* 23.6*  MCV 95.3 91.6 90.2 90.9 87.1  PLT 23* 23* 22* 20* 19*   Basic Metabolic Panel: Recent Labs  Lab 10/19/20 0317 10/20/20 0257 10/21/20 0454 10/23/20 0556 10/25/20 0658  NA 136 137 139 139 136  K 4.7 5.1 4.6 4.3 4.4  CL 103  105 106 104 103  CO2 _0 GLUCOSE 182* 257* 156* 66* 71  BUN 29* 33* 26* 32* 35*  CREATININE 0.60 0.71 0.60 0.62 0.71  CALCIUM 9.3 9.2 9.3 9.1 8.8*   GFR: Estimated Creatinine Clearance: 74.7 mL/min (by C-G formula based on SCr of 0.71 mg/dL). Liver Function Tests: No results for input(s): AST, ALT, ALKPHOS, BILITOT, PROT, ALBUMIN in the last 168 hours. No results for input(s): LIPASE, AMYLASE in the last 168 hours. No results for input(s): AMMONIA in the last 168 hours. Coagulation Profile: No results for input(s): INR, PROTIME in the last 168 hours. Cardiac Enzymes: No results for input(s): CKTOTAL, CKMB, CKMBINDEX, TROPONINI in the last 168 hours. BNP (last 3 results) No results for input(s): PROBNP in the last 8760 hours. HbA1C: No results for input(s): HGBA1C in the last 72 hours. CBG: Recent Labs  Lab 10/24/20 1642 10/24/20 2212  10/25/20 0717 10/25/20 0805 10/25/20 1124  GLUCAP 149* 92 68* 146* 100*   Lipid Profile: No results for input(s): CHOL, HDL, LDLCALC, TRIG, CHOLHDL, LDLDIRECT in the last 72 hours. Thyroid Function Tests: No results for input(s): TSH, T4TOTAL, FREET4, T3FREE, THYROIDAB in the last 72 hours. Anemia Panel: No results for input(s): VITAMINB12, FOLATE, FERRITIN, TIBC, IRON, RETICCTPCT in the last 72 hours. Sepsis Labs: Recent Labs  Lab 10/23/20 2222 10/24/20 0214  LATICACIDVEN 1.6 1.3    Recent Results (from the past 240 hour(s))  Culture, blood (routine x 2)     Status: None (Preliminary result)   Collection Time: 10/23/20 10:22 PM   Specimen: BLOOD  Result Value Ref Range Status   Specimen Description BLOOD RIGHT ANTECUBITAL  Final   Special Requests   Final    BOTTLES DRAWN AEROBIC AND ANAEROBIC Blood Culture adequate volume   Culture   Final    NO GROWTH 2 DAYS Performed at [facility], [address] [place], [place] [address]    Report Status PENDING  Incomplete  Culture, blood (routine x 2)     Status: None (Preliminary result)   Collection Time: 10/23/20 10:22 PM   Specimen: BLOOD RIGHT HAND  Result Value Ref Range Status   Specimen Description BLOOD RIGHT HAND  Final   Special Requests   Final    BOTTLES DRAWN AEROBIC ONLY Blood Culture adequate volume   Culture   Final    NO GROWTH 2 DAYS Performed at [facility], [place] [address], [place], [place] [address]    Report Status PENDING  Incomplete         Radiology Studies: No results found.      Scheduled Meds:  sodium chloride   Intravenous Once   diclofenac Sodium  2 g Topical TID AC & HS   furosemide  40 mg Oral Daily   guaiFENesin  600 mg Oral BID   insulin aspart  0-20 Units Subcutaneous TID WC   insulin aspart  0-5 Units Subcutaneous QHS   insulin aspart  3 Units Subcutaneous TID WC   insulin glargine  20 Units Subcutaneous QHS   linaclotide  145 mcg Oral QAC breakfast    metoprolol tartrate  12.5 mg Oral BID   montelukast  10 mg Oral QHS   olopatadine  1 drop Both Eyes BID   predniSONE  10 mg Oral Q breakfast   pregabalin  100 mg Oral QHS   sertraline  100 mg Oral QHS   sodium chloride flush  3 mL Intravenous Q12H  traZODone  100 mg Oral QHS   Continuous Infusions:   LOS: 20 days    Time spent: 25 mins.More than 50% of that time was spent in counseling and/or coordination of care.      Shelly Coss, MD [facility] P7/02/2021, 12:52 PM

## 2020-10-25 NOTE — Consult Note (Signed)
Consultation Note Date: 10/25/2020   Patient Name: Kathy Irwin  DOB: 04/18/1950  MRN: [id number]  Age / Sex: 70 y.o., female  PCP: Kerin Perna, NP Referring Physician: Shelly Coss, MD  Reason for Consultation: Establishing goals of care  HPI/Patient Profile: 70 y.o. female  with past medical history of CMML not in remission, chronic ITP, anemia, diastolic CHF, HTN, HLD, ILD, DM, GERD, anxiety admitted on 10/04/2020 with shortness of breath, nausea/vomiting, progressive weakness due to symptomatic anemia with CMML and SIRS with possible pneumonia. Hospitalization complicated by ongoing anemia and recurrent need for blood transfusion.   Clinical Assessment and Goals of Care: I met today at Kathy Irwin' bedside and no other visitors present. Kathy Irwin is resting comfortably in bed. I introduced myself to her and explained that I am from palliative care and I see patients who have serious and difficult disease, such as her cancer, to provide support and ensure that they are getting the information they need to make decisions for their care.   Kathy Irwin reports to me that she is feeling well, has good appetite, and no pain except some pain in hip with activity. She has good support from family: 1 sister, 3 daughters, and 1 son who live locally. She does express frustration that each day there seems to be something that is a barrier (low blood sugar, low blood counts) to her meeting her goal to get up out of bed and work with therapy to get stronger. She reports that she is a very spiritual person - "each day I wake up I just thank God." She talks of her relationship with God and how this keeps her strong and she expresses that she knows in her heart that she will improve. She is not interested in discussing or even entertaining any other option except improvement. She does share that she would want her sister  Kathy Irwin along with her 4 children to make decisions together for her if she were unable to speak for herself and notes "I know they will do everything to save me."   I reassure her that the medical team is doing all they can to support her in this goal but her cancer and the side effects do present many barriers. We also spent time discussing her cancer effecting her bone marrow and common side effects and complications of fatigue, anemia, weakness due to location of this cancer. We discussed the difficulty of treatment for her cancer as the treatment can make these complications worse and we have to be careful not to do things that will make her more sick but also knowing that the cancer only worsens without treatment so this is the difficult balancing act that has been a struggle for her recently. Kathy Irwin expresses understanding. Goals remain clear for full scope treatment and hopes for some level of improvement and transition to rehab.   All questions/concerns addressed. Emotional support provided.   Primary Decision Maker PATIENT    SUMMARY OF RECOMMENDATIONS   - Full scope treatment  desired.  - Hopeful for rehab trial.   Code Status/Advance Care Planning: Full code   Symptom Management:  Per attending and oncology.   Palliative Prophylaxis:  Bowel Regimen, Delirium Protocol, Frequent Pain Assessment, and Turn Reposition  Additional Recommendations (Limitations, Scope, Preferences): Full Scope Treatment  Prognosis:  Prognosis guarded especially with transfusion dependent anemia requiring transfusion every ~4 days.   Discharge Planning: [facility] for rehab with Palliative care service follow-up      Primary Diagnoses: Present on Admission:  Symptomatic anemia  HTN (hypertension)  Idiopathic thrombocytopenia (HCC)  Type 2 diabetes mellitus with complication, without long-term current use of insulin (HCC)  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HCC)   Chronic diastolic CHF (congestive heart failure) (HCC)  Hyperbilirubinemia  Obesity (BMI 30-39.9)   I have reviewed the medical record, interviewed the patient and family, and examined the patient. The following aspects are pertinent.  Past Medical History:  Diagnosis Date   Acute respiratory failure with hypoxia ([place]) 12/2018   Anemia, unspecified    CHF (congestive heart failure) (HCC)    CML (chronic myelocytic leukemia) (HCC)    Diabetes mellitus without complication ([place])    Esophageal reflux    Hyperlipemia    Hypersensitivity pneumonitis ([place]) 12/19/2017   Hypertension    ILD (interstitial lung disease) ([place]) 12/24/2018   Social History   Socioeconomic History   Marital status: Married    Spouse name: Not on file   Number of children: Not on file   Years of education: Not on file   Highest education level: Not on file  Occupational History   Not on file  Tobacco Use   Smoking status: Never   Smokeless tobacco: Never  Vaping Use   Vaping Use: Never used  Substance and Sexual Activity   Alcohol use: Never   Drug use: Never   Sexual activity: Not on file  Other Topics Concern   Not on file  Social History Narrative   Not on file   Social Determinants of Health   Financial Resource Strain: Not on file  Food Insecurity: Not on file  Transportation Needs: Not on file  Physical Activity: Not on file  Stress: Not on file  Social Connections: Not on file   Family History  Problem Relation Age of Onset   Diabetes Other    Hypertension Other    Scheduled Meds:  sodium chloride   Intravenous Once   diclofenac Sodium  2 g Topical TID AC & HS   furosemide  40 mg Oral Daily   guaiFENesin  600 mg Oral BID   insulin aspart  0-20 Units Subcutaneous TID WC   insulin aspart  0-5 Units Subcutaneous QHS   insulin aspart  3 Units Subcutaneous TID WC   insulin glargine  20 Units Subcutaneous QHS   linaclotide  145 mcg Oral QAC breakfast   metoprolol tartrate  12.5 mg  Oral BID   montelukast  10 mg Oral QHS   olopatadine  1 drop Both Eyes BID   predniSONE  10 mg Oral Q breakfast   pregabalin  100 mg Oral QHS   sertraline  100 mg Oral QHS   sodium chloride flush  3 mL Intravenous Q12H   traZODone  100 mg Oral QHS   Continuous Infusions: PRN Meds:.acetaminophen **OR** acetaminophen, albuterol, diphenhydrAMINE-zinc acetate, fluticasone, HYDROcodone-acetaminophen, methocarbamol, morphine injection, ondansetron **OR** ondansetron (ZOFRAN) IV Allergies  Allergen Reactions   Other Shortness Of Breath and Other (See Comments)    Newspaper ink =  new chest pain, also   Ativan [Lorazepam] Other (See Comments)    Severe confusion and agitation.    Iodine Other (See Comments)    Unknown reaction   Iodine I 131 Tositumomab Other (See Comments)    unknown   Merbromin Other (See Comments)    Unknown reaction - Mercurochrome- "Was a long time ago"    Tape Rash   Review of Systems  Constitutional:  Positive for activity change and fatigue. Negative for appetite change.  Musculoskeletal:        Hip pain with activity  Neurological:  Positive for weakness.   Physical Exam Vitals and nursing note reviewed.  Constitutional:      General: She is not in acute distress.    Appearance: She is ill-appearing.  Cardiovascular:     Rate and Rhythm: Normal rate.  Pulmonary:     Effort: No tachypnea, accessory muscle usage or respiratory distress.  Abdominal:     General: Abdomen is flat.  Neurological:     Mental Status: She is alert and oriented to person, place, and time.    Vital Signs: BP (!) 108/47 (BP Location: Right Arm)   Pulse 95   Temp 97.9 F (36.6 C) (Oral)   Resp 18   Ht _0  (1.626 m)   Wt 98.6 kg   SpO2 98%   BMI 37.32 kg/m  Pain Scale: 0-10   Pain Score: 0-No pain   SpO2: SpO2: 98 % O2 Device:SpO2: 98 % O2 Flow Rate: .O2 Flow Rate (L/min): 2 L/min  IO: Intake/output summary:  Intake/Output Summary (Last 24 hours) at 10/25/2020  1049 Last data filed at 10/25/2020 0651 Gross per 24 hour  Intake 784 ml  Output 1700 ml  Net -916 ml    LBM: Last BM Date: 10/24/20 Baseline Weight: Weight: 101.6 kg Most recent weight: Weight: 98.6 kg     Palliative Assessment/Data:     Time In: 1050 Time Out: 1140 Time Total: 50 min Greater than 50%  of this time was spent counseling and coordinating care related to the above assessment and plan.  Signed by: Vinie Sill, NP Palliative Medicine Team Pager # [phone] (M-F 8a-5p) Team Phone # [phone] (Nights/Weekends)

## 2020-10-25 NOTE — Progress Notes (Signed)
Occupational Therapy Treatment Patient Details Name: Kathy Irwin MRN: [id number] DOB: 1951-02-02 Today's Date: 10/25/2020    History of present illness 70 y.o. female presents to ED 10/04/20 with continued complaints of SoB and chest tightness. Presented to [facility] with similar complaints 6/17 In ED  HGB 5.1 and received 2 units of PRBC Chest x-ray noted improving bibasilar infiltrates compared to x-ray from 6/17 CT of the chest performed which was negative for PE but did show increase in her splenomegaly. 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  She last had received infusion with romiplostim on 6/14.   OT comments  Pt received in bed, agreeable to OT session. Pt required minA for bed mobility this date. She required minA for toilet transfer and modA for pericare and LB dressing. Pt continues to demonstrate decreased activity tolerance, generalized weakness and instability impacting her safety and independence with self-care and functional mobility. Pt tolerated stand-pivots x3 this session. She would greatly benefit from continued therapy services at [facility]. However, due to limitations with insurance approval we may need to explore additional d/c options. Pt will require 24/7 assistance at d/c. Pt will continue to benefit from skilled OT services to maximize safety and independence with ADL/IADL and functional mobility. Will continue to follow acutely and progress as tolerated.    Follow Up Recommendations  SNF (needs snf but insurance will not pay.)    Equipment Recommendations  None recommended by OT    Recommendations for Other Services      Precautions / Restrictions Precautions Precautions: Fall (Pt reports 1 fall in the past 6 months) Precaution Comments: watch O2; soft BPs Restrictions Weight Bearing Restrictions: No       Mobility  Bed Mobility Overal bed mobility: Needs Assistance Bed Mobility: Supine to Sit     Supine to sit: Min assist;HOB elevated     General bed mobility comments: minA to progress trunk to upright position. Pt able to scoot hips to EOB    Transfers Overall transfer level: Needs assistance Equipment used: Rolling walker (2 wheeled);2 person hand held assist Transfers: Sit to/from [facility] Sit to Stand: Min assist Stand pivot transfers: Min assist       General transfer comment: minA to powerup into standing, vc for safe hand placement. minA for stability with pivot from EOB>recliner>BSC>recliner    Balance Overall balance assessment: Needs assistance Sitting-balance support: Feet supported Sitting balance-Leahy Scale: Good     Standing balance support: Bilateral upper extremity supported;During functional activity Standing balance-Leahy Scale: Poor Standing balance comment: heavy reliance on BUE support in standing                           ADL either performed or assessed with clinical judgement   ADL Overall ADL's : Needs assistance/impaired Eating/Feeding: Modified independent   Grooming: Set up;Sitting               Lower Body Dressing: Moderate assistance;Sit to/from stand Lower Body Dressing Details (indicate cue type and reason): to adjust socks Toilet Transfer: Minimal assistance;BSC;RW;Stand-pivot Toilet Transfer Details (indicate cue type and reason): recliner<>BSC Toileting- Clothing Manipulation and Hygiene: Moderate assistance;Sit to/from stand Toileting - Clothing Manipulation Details (indicate cue type and reason): assistance for pericare in standing     Functional mobility during ADLs: Minimal assistance;Rolling walker General ADL  Comments: pt with decreased activity tolerance, generalized weakness and instability;continued to reinforce energy conservation strategies. Pt unable to provide example this session     Vision    Vision Assessment?: No apparent visual deficits   Perception     Praxis      Cognition Arousal/Alertness: Awake/alert (but very sleepy, and drifts into eyes closed easily) Behavior During Therapy: [facility] for tasks assessed/performed Overall Cognitive Status: Within Functional Limits for tasks assessed                                 General Comments: pt with decreased safety awareness, did not formally assess.        Exercises     Shoulder Instructions       General Comments HR 105bpm at rest, unable to get reading of HR during mobility due to tele not picking up HR despite disconnecting/reconnecting and new leads, RN arrived at end of session to assist    Pertinent Vitals/ Pain       Pain Assessment: Faces Faces Pain Scale: Hurts little more Pain Location: r hip Pain Descriptors / Indicators: Grimacing;Guarding Pain Intervention(s): Monitored during session  Home Living                                          Prior Functioning/Environment              Frequency  Min 2X/week        Progress Toward Goals  OT Goals(current goals can now be found in the care plan section)  Progress towards OT goals: Progressing toward goals  Acute Rehab OT Goals Patient Stated Goal: get stronger, and be able to safely get home OT Goal Formulation: With patient Time For Goal Achievement: 11/03/20 Potential to Achieve Goals: Good ADL Goals Pt Will Perform Grooming: with supervision;sitting;standing Pt Will Perform Lower Body Dressing: with min assist;with adaptive equipment;sit to/from stand Pt Will Transfer to Toilet: with supervision;ambulating Pt Will Perform Toileting - Clothing Manipulation and hygiene: with min guard assist;sit to/from stand Additional ADL Goal #1: Pt will state 3 energy conservation strategies in order to increase independence during grooming tasks and ADL routine Additional ADL Goal #2: Pt will perform OOB ADL tasks  x10 min self monitoring O2 sats >90%  Plan Discharge plan remains appropriate    Co-evaluation                 AM-PAC OT "6 Clicks" Daily Activity     Outcome Measure   Help from another person eating meals?: None Help from another person taking care of personal grooming?: A Little Help from another person toileting, which includes using toliet, bedpan, or urinal?: A Little Help from another person bathing (including washing, rinsing, drying)?: A Little Help from another person to put on and taking off regular upper body clothing?: A Little Help from another person to put on and taking off regular lower body clothing?: A Lot 6 Click Score: 18    End of Session Equipment Utilized During Treatment: Gait belt;Rolling walker;Oxygen  OT Visit Diagnosis: Unsteadiness on feet (R26.81);Muscle weakness (generalized) (M62.81)   Activity Tolerance Patient limited by fatigue   Patient Left in chair;with call bell/phone within reach;with chair alarm set   Nurse Communication Mobility status (ok therapy)        Time: 0569-7948 OT  Time Calculation (min): 30 min  Charges: OT General Charges $OT Visit: 1 Visit OT Treatments $Self Care/Home Management : 23-37 mins  Helene Kelp OTR/L Acute Rehabilitation Services Office: [place] 10/25/2020, 2:47 PM

## 2020-10-26 ENCOUNTER — Encounter (HOSPITAL_COMMUNITY): Payer: 59 | Admitting: Cardiology

## 2020-10-26 ENCOUNTER — Inpatient Hospital Stay: Payer: 59

## 2020-10-26 LAB — GLUCOSE, CAPILLARY
Glucose-Capillary: 57 mg/dL — ABNORMAL LOW (ref 70–99)
Glucose-Capillary: 133 mg/dL — ABNORMAL HIGH (ref 70–99)
Glucose-Capillary: 107 mg/dL — ABNORMAL HIGH (ref 70–99)
Glucose-Capillary: 131 mg/dL — ABNORMAL HIGH (ref 70–99)
Glucose-Capillary: 322 mg/dL — ABNORMAL HIGH (ref 70–99)

## 2020-10-26 LAB — HEMOGLOBIN A1C
Hgb A1c MFr Bld: 7 % — ABNORMAL HIGH (ref 4.8–5.6)
Mean Plasma Glucose: 154.2 mg/dL

## 2020-10-26 MED ORDER — ROMIPLOSTIM INJECTION 500 MCG
8.0000 ug/kg | SUBCUTANEOUS | Status: DC
  Administered 2020-10-26: 790 ug via SUBCUTANEOUS
  Filled 2020-10-26: qty 1.58

## 2020-10-26 MED ORDER — ALBUTEROL SULFATE (2.5 MG/3ML) 0.083% IN NEBU: 2.5000 mg | INHALATION_SOLUTION | Freq: Four times a day (QID) | RESPIRATORY_TRACT | 12 refills | Status: AC | PRN

## 2020-10-26 MED ORDER — INSULIN ASPART 100 UNIT/ML IJ SOLN
0.0000 [IU] | Freq: Every day | INTRAMUSCULAR | Status: DC
  Administered 2020-10-26: 4 [IU] via SUBCUTANEOUS

## 2020-10-26 MED ORDER — INSULIN GLARGINE 100 UNIT/ML ~~LOC~~ SOLN
10.0000 [IU] | Freq: Every day | SUBCUTANEOUS | Status: DC
  Filled 2020-10-26: qty 0.1

## 2020-10-26 MED ORDER — INSULIN ASPART 100 UNIT/ML IJ SOLN: 0.0000 [IU] | Freq: Three times a day (TID) | INTRAMUSCULAR | 11 refills | Status: DC

## 2020-10-26 MED ORDER — TRAMADOL HCL 50 MG PO TABS: 50.0000 mg | ORAL_TABLET | Freq: Four times a day (QID) | ORAL | Status: AC | PRN

## 2020-10-26 MED ORDER — ROMIPLOSTIM INJECTION 500 MCG: 8.0000 ug/kg | SUBCUTANEOUS | Status: AC

## 2020-10-26 MED ORDER — DIPHENHYDRAMINE HCL 25 MG PO CAPS
25.0000 mg | ORAL_CAPSULE | Freq: Once | ORAL | Status: AC
  Administered 2020-10-26: 25 mg via ORAL
  Filled 2020-10-26: qty 1

## 2020-10-26 MED ORDER — ACETAMINOPHEN 325 MG PO TABS: 650.0000 mg | ORAL_TABLET | Freq: Four times a day (QID) | ORAL | Status: AC | PRN

## 2020-10-26 MED ORDER — INSULIN ASPART 100 UNIT/ML IJ SOLN: 0.0000 [IU] | Freq: Every day | INTRAMUSCULAR | 11 refills | Status: AC

## 2020-10-26 MED ORDER — GUAIFENESIN ER 600 MG PO TB12: 600.0000 mg | ORAL_TABLET | Freq: Two times a day (BID) | ORAL | Status: AC

## 2020-10-26 MED ORDER — METHOCARBAMOL 500 MG PO TABS: 500.0000 mg | ORAL_TABLET | Freq: Four times a day (QID) | ORAL | Status: AC | PRN

## 2020-10-26 MED ORDER — SERTRALINE HCL 100 MG PO TABS: 100.0000 mg | ORAL_TABLET | Freq: Every day | ORAL | Status: AC

## 2020-10-26 MED ORDER — DIPHENHYDRAMINE-ZINC ACETATE 2-0.1 % EX CREA: TOPICAL_CREAM | Freq: Three times a day (TID) | CUTANEOUS | 0 refills | Status: AC | PRN

## 2020-10-26 MED ORDER — COVID-19 MRNA VAC-TRIS(PFIZER) 30 MCG/0.3ML IM SUSP
0.3000 mL | Freq: Once | INTRAMUSCULAR | Status: AC
  Administered 2020-10-26: 0.3 mL via INTRAMUSCULAR
  Filled 2020-10-26: qty 0.3

## 2020-10-26 MED ORDER — INSULIN ASPART 100 UNIT/ML IJ SOLN: 0.0000 [IU] | Freq: Three times a day (TID) | INTRAMUSCULAR | Status: DC

## 2020-10-26 MED ORDER — TRAZODONE HCL 100 MG PO TABS: 100.0000 mg | ORAL_TABLET | Freq: Every day | ORAL | Status: AC

## 2020-10-26 MED ORDER — ONDANSETRON HCL 4 MG PO TABS: 4.0000 mg | ORAL_TABLET | Freq: Four times a day (QID) | ORAL | 0 refills | Status: AC | PRN

## 2020-10-26 MED ORDER — MORPHINE SULFATE (PF) 2 MG/ML IV SOLN: 2.0000 mg | INTRAVENOUS | 0 refills | Status: AC | PRN

## 2020-10-26 MED ORDER — INSULIN ASPART 100 UNIT/ML IJ SOLN: 0.0000 [IU] | Freq: Three times a day (TID) | INTRAMUSCULAR | 11 refills | Status: AC

## 2020-10-26 MED ORDER — SIMETHICONE 80 MG PO CHEW
80.0000 mg | CHEWABLE_TABLET | Freq: Once | ORAL | Status: AC
  Administered 2020-10-26: 80 mg via ORAL
  Filled 2020-10-26: qty 1

## 2020-10-26 MED ORDER — MONTELUKAST SODIUM 10 MG PO TABS: 10.0000 mg | ORAL_TABLET | Freq: Every day | ORAL | Status: AC

## 2020-10-26 MED ORDER — SODIUM CHLORIDE 0.9% IV SOLUTION: 10.0000 mL | Freq: Once | INTRAVENOUS | 0 refills | Status: AC

## 2020-10-26 MED ORDER — INSULIN ASPART 100 UNIT/ML IJ SOLN
0.0000 [IU] | Freq: Three times a day (TID) | INTRAMUSCULAR | Status: DC
  Administered 2020-10-28: 3 [IU] via SUBCUTANEOUS

## 2020-10-26 MED ORDER — LINACLOTIDE 145 MCG PO CAPS
145.0000 ug | ORAL_CAPSULE | Freq: Every day | ORAL | Status: AC
Start: 2020-10-27 — End: ?

## 2020-10-26 MED ORDER — DICLOFENAC SODIUM 1 % EX GEL: 2.0000 g | Freq: Three times a day (TID) | CUTANEOUS | Status: AC

## 2020-10-26 MED ORDER — METOPROLOL TARTRATE 25 MG PO TABS: 12.5000 mg | ORAL_TABLET | Freq: Two times a day (BID) | ORAL | Status: AC

## 2020-10-26 NOTE — Progress Notes (Signed)
Physical Therapy Treatment Patient Details Name: Kathy Irwin MRN: [id number] DOB: 06-27-1950 Today's Date: 11/09/2020    History of Present Illness 69 y.o. female presents to ED 10/04/20 with continued complaints of SoB and chest tightness. Presented to [facility] with similar complaints 6/17 In ED  HGB 5.1 and received 2 units of PRBC Chest x-ray noted improving bibasilar infiltrates compared to x-ray from 6/17 CT of the chest performed which was negative for PE but did show increase in her splenomegaly. 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  She last had received infusion with romiplostim on 6/14.    PT Comments    Continuing work on functional mobility and activity tolerance;  session focused on gently getting back to walking, with close monitor of BPs and a chair close behind pt, in case she needed to sit quickly; Notable big drop in BP after intial bout of walking, but BPs rallied back to better readings that are appropriate responses to exercise/activity with next 2 bouts of in room amb; see below for serial BPs  Follow Up Recommendations  SNF;Other (comment) (Noted possibility of pt transferring to [facility])     Equipment Recommendations  Other (comment) (If she has no other choice than to dc home, recommend WC with cushion, in additiion to what she already has)    Recommendations for Other Services       Precautions / Restrictions Precautions Precautions: Fall (Pt reports 1 fall in the past 6 months) Precaution Comments: watch O2; soft BPs    Mobility  Bed Mobility                    Transfers Overall transfer level: Needs assistance Equipment used: Rolling walker (2 wheeled);2 person hand held assist Transfers: Sit to/from Stand Sit to Stand: Min guard         General transfer comment: Cues for hand  plaement and safety  Ambulation/Gait Ambulation/Gait assistance: Min guard Gait Distance (Feet): 20 Feet (5+5+10) Assistive device: Rolling walker (2 wheeled) Gait Pattern/deviations: Step-through pattern;Decreased stride length;Decreased weight shift to right;Trunk flexed     General Gait Details: Good use of RW for support, esp with R hip pain in stance; cues to self-monitor for activity tolerance; intial precipitous BP drop, but rallied with further bouts of amb; second person for safety   Stairs             Wheelchair Mobility    Modified Rankin (Stroke Patients Only)       Balance     Sitting balance-Leahy Scale: Good       Standing balance-Leahy Scale: Poor Standing balance comment: heavy reliance on BUE support in standing                            Cognition Arousal/Alertness: Awake/alert (much more awake than previous PT session) Behavior During Therapy: WFL for tasks assessed/performed Overall Cognitive Status: Within Functional Limits for tasks assessed                                        Exercises      General Comments General comments (skin integrity, edema, etc.):   10/27/2020 1240 10/25/2020 1245 10/16/2020 1250  Orthostatic  Sitting  BP- Sitting 94/66 (!) 70/59 (seated after walking short distance) 115/56  Pulse- Sitting 95 108 102    11/03/2020 1255 10/22/2020 1300  Orthostatic Sitting  BP- Sitting 123/66 (after walking short distance) 137/63 (after walkign short distance)  Pulse- Sitting 109 114        Pertinent Vitals/Pain Pain Assessment: Faces Faces Pain Scale: Hurts little more Pain Location: r hip Pain Descriptors / Indicators: Grimacing;Guarding Pain Intervention(s): Monitored during session    Home Living                      Prior Function            PT Goals (current goals can now be found in the care plan section) Acute Rehab PT Goals Patient Stated Goal: get stronger, and be able  to safely get home PT Goal Formulation: With patient Time For Goal Achievement: 11/07/20 Progress towards PT goals: Progressing toward goals    Frequency    Min 3X/week      PT Plan Current plan remains appropriate    Co-evaluation              AM-PAC PT "6 Clicks" Mobility   Outcome Measure  Help needed turning from your back to your side while in a flat bed without using bedrails?: None Help needed moving from lying on your back to sitting on the side of a flat bed without using bedrails?: A Little Help needed moving to and from a bed to a chair (including a wheelchair)?: A Lot Help needed standing up from a chair using your arms (e.g., wheelchair or bedside chair)?: A Lot Help needed to walk in hospital room?: A Lot   6 Click Score: 13    End of Session Equipment Utilized During Treatment: Gait belt;Oxygen Activity Tolerance: Patient tolerated treatment well Patient left: in chair;with call bell/phone within reach;with chair alarm set Nurse Communication: Mobility status PT Visit Diagnosis: Other abnormalities of gait and mobility (R26.89);Muscle weakness (generalized) (M62.81);Difficulty in walking, not elsewhere classified (R26.2);Pain Pain - Right/Left: Right Pain - part of body: Hip     Time: 1235-1300 PT Time Calculation (min) (ACUTE ONLY): 25 min  Charges:  $Gait Training: 8-22 mins $Therapeutic Activity: 8-22 mins                     Roney Marion, PT  Acute Rehabilitation Services Pager [phone] Office [phone]    [place] 10/31/2020, 4:58 PM

## 2020-10-26 NOTE — Progress Notes (Addendum)
This chaplain responded to PMT consult for spiritual care.  The Pt. invited the chaplain to sit down at the bedside.  The chaplain observed several devotionals at the Pt. bedside. The Pt. daughter-Sharon joined midway through the visit.    The chaplain holds the Pt hand and practices reflective listening and emotional support during the visit.  The chaplain understands the Pt. is experiencing spiritual distress-"fear" as the medical team continues their communication of the Pt. choices:  Hospice vs. Chemotherapy.   At this time the Pt. agrees with the children's choice of continued chemotherapy. The chaplain understands the Pt. is looking for "peace" in her choice of goals of care. The chaplain offers the opportunity to explore quality of life to the Pt.  The Pt. does not directly respond to the question.   The chaplain recognizes the Pt. lack of mobility since January comes up often in the visit. The Pt. often reflects on she "is not experiencing any pain" as she talks about her choices. The chaplain will revisit quality of life with the Pt. keeping in mind the Pt. two previous statements.  The chaplain paused the spiritual care visit when the Pt. daughter-Sharon recommended a phone call to the Pt. Clergy. This chaplain will F/U with Pt.  **1515 This chaplain returned to follow up on the previous spiritual care visit with Pt. and family.  The Pt. greets the chaplain upon her arrival.   The chaplain understands the family is anticipating a transfer to [facility] before the weekend. The chaplain accepted the family's invitation to continue a hopeful relationship with the Pt.

## 2020-10-26 NOTE — Progress Notes (Signed)
[facility] PROGRESS NOTE  Kathy Irwin WJX:[id number] DOB: [birth year] DOA: 10/04/2020 PCP: Kerin Perna, NP  Status: Remains inpatient appropriate because:Unsafe d/c plan, IV treatments appropriate due to intensity of illness or inability to take PO, and Inpatient level of care appropriate due to severity of illness  Dispo: The patient is from: Home              Anticipated d/c is to: SNF              Patient currently is not medically stable to d/c.   Difficult to place patient Yes   Level of care: Telemetry Medical  Code Status: Full Family Communication:  DVT prophylaxis: No pharmacological prophylaxis secondary to ongoing thrombocytopenia COVID vaccination status: Pfizer on 12/01/2019 and 12/23/2019.  First booster given on 7/12  HPI: 70 year old female with history of CMML, chronic ITP, diastolic heart failure, diabetes, hypertension, presents to the hospital with complaints of shortness of breath.  As per the report,he was noted to be getting dizzy and breaking out in a sweat when she was standing up.  Hemoglobin noted to be 5 on admission.  She was also significantly thrombocytopenic.  CTA chest negative for PE.  She was transfused PRBC and oncology was consulted.  Hospital course remarkable for requiring several units of blood transfusion for persistent anemia.  Oncology following and recommended hospice approach given her significant burden of oncology cares and limited likelihood of immediate improvement of her bone marrow functioning.  Patient declined hospice and wants to continue transfusion and Vidaza/Nplate therapy..  Prolonged hospital course requiring frequent transfusion for low hemoglobin.  PT/OT recommending skilled nursing facility but not sure when she is stable for discharge because of frequent transfusion requirement.  Palliative care also following.  Subjective: Patient alert and sitting at bedside speaking with chaplain.  No specific complaints.  Denies  abdominal pain, hematuria or blood in stools.  Denies weakness or dizziness.  Objective: Vitals:   10/25/20 2125 10/16/2020 0519  BP: (!) 104/59 (!) 103/54  Pulse: 96 94  Resp: 17 17  Temp: 97.8 F (36.6 C) 98.3 F (36.8 C)  SpO2: 97% 95%    Intake/Output Summary (Last 24 hours) at 11/14/2020 0830 Last data filed at 10/22/2020 0519 Gross per 24 hour  Intake 600 ml  Output 1650 ml  Net -1050 ml   Filed Weights   10/24/20 0454 10/25/20 0650 10/25/20 2125  Weight: 100.2 kg 98.6 kg 98.6 kg    Exam:  Constitutional: NAD, calm, peers to be comfortable Respiratory: clear to auscultation bilaterally, no wheezing, no crackles. Normal respiratory effort. No accessory muscle use.  Room air saturations at 5 AM were 95-patient subsequently had been placed on oxygen 3 L and O2 saturations now 100% Cardiovascular: Regular rate and rhythm, no murmurs / rubs / gallops.  Trace bilateral lower extremity edema.  Normotensive with intermittent tachycardia Abdomen: no tenderness, no masses palpated. Bowel sounds positive.  Eating well. LBM 7/10 Neurologic: CN 2-12 grossly intact. Sensation intact, Strength 4/5 x all 4 extremities.  Psychiatric: Alert and oriented x3.  Very pleasant affect.   Assessment/Plan: Acute problems:  Symptomatic anemia/pancytopenia-transfusion dependent -Secondary to  chronic myelomonocytic leukemia, chemotherapy, chronic ITP -Hemoglobin of 5.1 on admission with current hemoglobin as of 7/12 of 7.9 with WBC 2.9 and platelets 19,000 -She has received a total of 10 units prbc thus far -Transfuse if hemoglobin <7.5 and transfuse platelets if <10K -Has been continued on Nplate during current hospitalization-today's dose will be 8 mcg/kg  CMML -Status post 2 cycles of 5 azacitidine -Since she declined hospice approach, oncology recommended to follow-up as an outpatient for rescheduling her cycle 3 of Vidaza. -7/12 discussed with oncology possibility of resuming chemotherapy  as an inpatient given difficulty in obtaining SNF bed/be eligible for outpatient treatment. -Oncology discussed moving forward with hospice but patient declined - we have  consulted palliative care. -Palliative care discussed with the patient and she wants to remain full code, continue full scope treatment, palliative care  recommended skilled nursing facility with palliative care service follow-up.   Acute on chronic respiratory failure with hypoxia -She is chronically on 2 to 3 L of oxygen -CT chest: Radiologist questions possible interstitial lung disease versus pneumonia -Currently on prednisone taper -Continue bronchodilators as needed   Coagulopathy -Noted to have elevated INR - previously received vitamin K -INR ok now   Chronic diastolic congestive heart failure -continue home dose of lasix -Continue beta-blocker, blood pressure readings somewhat suboptimal so no ACE inhibitor/ARB -Continue I/O; currently in a -1900 cc balance -Has lower extremity edema   Diabetes mellitus type 2, well controlled -Takes  metformin at home she is currently on hold -AM CBG down to 57 this morning with p.m. CBG around 9 at 242 -1/12: Will decrease p.m. Lantus from 14 units to 10 units; patient's oral intake has been variable but has improved as of today -Currently on resistant SSI -A1c from 4/25 noted to be 6   Hypertension -Continue metoprolol   Splenomegaly -Noted on CT scan, felt to be related to CMML   Physical deconditioning without ambulatory dysfunction -Seen by physical therapy with recommendations for skilled nursing facility -Documented with rapid fatigue during therapy session   Morbid obesity -BMI 37.9   Chronic right hip pain -Patient has a history of right intertrochanteric/subtrochanteric femur fracture in 04/2020 status post intramedullary nail by Dr. Doreatha Martin -She has had many issues since surgery including infection and prolonged antibiotics -She has had continued pain  since her fracture-orthopedics this admission.  CT of the hip revealed ununited, comminuted right intertrochanteric fracture with intramedullary nail and and screw fixation. Intact hardware without evidence of loosening. -We recommend to follow-up with orthopedics as an outpatient. -- Continue Ultram, as needed IV morphine and Robaxin, also on Voltaren gel       Data Reviewed: Basic Metabolic Panel: Recent Labs  Lab 10/20/20 0257 10/21/20 0454 10/23/20 0556 10/25/20 0658  NA 137 139 139 136  K 5.1 4.6 4.3 4.4  CL 105 106 104 103  CO2 _0 GLUCOSE 257* 156* 66* 71  BUN 33* 26* 32* 35*  CREATININE 0.71 0.60 0.62 0.71  CALCIUM 9.2 9.3 9.1 8.8*   Liver Function Tests: No results for input(s): AST, ALT, ALKPHOS, BILITOT, PROT, ALBUMIN in the last 168 hours. No results for input(s): LIPASE, AMYLASE in the last 168 hours. No results for input(s): AMMONIA in the last 168 hours. CBC: Recent Labs  Lab 10/21/20 0454 10/23/20 0556 10/23/20 2222 10/24/20 0214 10/25/20 0658  WBC 2.6* 3.4* 4.0 4.4 2.9*  NEUTROABS  --   --   --  0.7*  --   HGB 7.5* 6.1* 7.2* 6.5* 7.9*  HCT 24.2* 18.6* 21.1* 20.0* 23.6*  MCV 95.3 91.6 90.2 90.9 87.1  PLT 23* 23* 22* 20* 19*   Cardiac Enzymes: No results for input(s): CKTOTAL, CKMB, CKMBINDEX, TROPONINI in the last 168 hours. BNP (last 3 results) Recent Labs    09/14/20 1148 10/01/20 2100 10/04/20 1713  BNP  192.1* 189.6* 500.3*    ProBNP (last 3 results) No results for input(s): PROBNP in the last 8760 hours.  CBG: Recent Labs  Lab 10/25/20 0805 10/25/20 1124 10/25/20 1719 10/25/20 2115 11/02/2020 0641  GLUCAP 146* 100* 144* 242* 57*    Recent Results (from the past 240 hour(s))  Culture, blood (routine x 2)     Status: None (Preliminary result)   Collection Time: 10/23/20 10:22 PM   Specimen: BLOOD  Result Value Ref Range Status   Specimen Description BLOOD RIGHT ANTECUBITAL  Final   Special Requests   Final     BOTTLES DRAWN AEROBIC AND ANAEROBIC Blood Culture adequate volume   Culture   Final    NO GROWTH 3 DAYS Performed at [facility], [place] [address], [place], [place] [address]    Report Status PENDING  Incomplete  Culture, blood (routine x 2)     Status: None (Preliminary result)   Collection Time: 10/23/20 10:22 PM   Specimen: BLOOD RIGHT HAND  Result Value Ref Range Status   Specimen Description BLOOD RIGHT HAND  Final   Special Requests   Final    BOTTLES DRAWN AEROBIC ONLY Blood Culture adequate volume   Culture   Final    NO GROWTH 3 DAYS Performed at [facility], [place] [address], [place], [place] [address]    Report Status PENDING  Incomplete     Studies: No results found.  Scheduled Meds:  sodium chloride   Intravenous Once   diclofenac Sodium  2 g Topical TID AC & HS   furosemide  40 mg Oral Daily   guaiFENesin  600 mg Oral BID   insulin aspart  0-20 Units Subcutaneous TID WC   insulin aspart  0-5 Units Subcutaneous QHS   insulin glargine  14 Units Subcutaneous QHS   linaclotide  145 mcg Oral QAC breakfast   metoprolol tartrate  12.5 mg Oral BID   montelukast  10 mg Oral QHS   olopatadine  1 drop Both Eyes BID   pregabalin  100 mg Oral QHS   sertraline  100 mg Oral QHS   sodium chloride flush  3 mL Intravenous Q12H   traZODone  100 mg Oral QHS   Continuous Infusions:  Active Problems:   Type 2 diabetes mellitus with complication, without long-term current use of insulin (HCC)   HTN (hypertension)   Obesity (BMI 30-39.9)   Chronic diastolic CHF (congestive heart failure) (HCC)   HCAP (healthcare-associated pneumonia)   Hyperbilirubinemia   Idiopathic thrombocytopenia (HCC)   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HCC)   Symptomatic anemia   Consultants: Oncology Palliative medicine  Procedures: None  Antibiotics: Anti-infectives (From admission, onward)    Start     Dose/Rate Route Frequency Ordered Stop   10/15/20 1800  fluconazole  (DIFLUCAN) tablet 150 mg        150 mg Oral  Once 10/15/20 1710 10/15/20 1937   10/05/20 1200  cefTRIAXone (ROCEPHIN) 2 g in sodium chloride 0.9 % 100 mL IVPB        2 g 200 mL/hr over 30 Minutes Intravenous Every 24 hours 10/05/20 1144 10/09/20 1235   10/05/20 1145  azithromycin (ZITHROMAX) tablet 500 mg        500 mg Oral Daily 10/05/20 1144 10/09/20 0840         Time spent: 35 minutes    Erin Hearing ANP  [facility] 7 am - 330 pm/M-F for direct patient care and secure chat  Please refer to Amion for contact info 21  days

## 2020-10-26 NOTE — Discharge Summary (Addendum)
Physician Discharge Summary  Kathy Irwin BDZ:[id number] DOB: [birth year] DOA: 10/04/2020  PCP: Kathy Perna, NP  Admit date: 10/04/2020 Discharge date: 10/28/20  Time spent: 55 minutes  Recommendations for Outpatient Follow-up:  Per family request for second opinion patient will transfer to [facility] and has been accepted onto the oncology service of Dr. Dario Irwin  COVID vaccinations: [place] on 12/01/2019 and 12/23/2019.  First booster given on 7/12   Discharge Diagnoses:  Active Problems:   Type 2 diabetes mellitus with complication, without long-term current use of insulin (HCC)   HTN (hypertension)   Acute on chronic respiratory failure with hypoxia (HCC)   Obesity (BMI 30-39.9)   Chronic diastolic CHF (congestive heart failure) (HCC)   HCAP (healthcare-associated pneumonia)   Hyperbilirubinemia   Idiopathic thrombocytopenia (HCC)   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HCC)   Symptomatic anemia    Discharge Condition: Stable  Diet recommendation: Carbohydrate modified thin liquid  Filed Weights   10/24/20 0454 10/25/20 0650 10/25/20 2125  Weight: 100.2 kg 98.6 kg 98.6 kg    History of present illness:  70 year old female with history of CMML, chronic ITP, diastolic heart failure, diabetes, hypertension, presents to the hospital with complaints of shortness of breath.  As per the report,he was noted to be getting dizzy and breaking out in a sweat when she was standing up.  Hemoglobin noted to be 5 on admission.  She was also significantly thrombocytopenic.  CTA chest negative for PE.  She was transfused PRBC and oncology was consulted.  Hospital course remarkable for requiring several units of blood transfusion for persistent anemia.  Oncology following and recommended hospice approach given her significant burden of oncology cares and limited likelihood of immediate improvement of her bone marrow functioning.  Patient declined hospice and wants to  continue transfusion and Vidaza/Nplate therapy..  Prolonged hospital course requiring frequent transfusion for low hemoglobin.  PT/OT recommending skilled nursing facility but not sure when she is stable for discharge because of frequent transfusion requirement.  Palliative care also following.  Hospital Course:   Symptomatic anemia/pancytopenia-transfusion dependent -Secondary to  chronic myelomonocytic leukemia, chemotherapy, chronic ITP -Hemoglobin of 5.1 on admission with current hemoglobin as of 7/12 of 7.9 with WBC 2.9 and platelets 19,000 -She has received a total of 10 units prbc thus far -Transfuse if hemoglobin <7.5 and transfuse platelets if <10K -Has been continued on Nplate during current hospitalization-today's dose will be 8 mcg/kg   CMML -Status post 2 cycles of 5 azacitidine -Since she declined hospice approach, oncology recommended to follow-up as an outpatient for rescheduling her cycle 3 of Vidaza. -7/12 discussed with oncology possibility of resuming chemotherapy as an inpatient given difficulty in obtaining SNF bed/be eligible for outpatient treatment. -Oncology discussed moving forward with hospice but patient declined  -Palliative care discussed with the patient and she wants to remain full code, continue full scope treatment, palliative care  recommended skilled nursing facility with palliative care service follow-up. -7/12: Family requested second opinion at [facility].  I spoke with oncology provider Dr. Dario Irwin.  History and case reviewed.  She has excepted the patient in transfer pending bed availability.  She reports that she will likely start with a bone marrow biopsy due to concerns of potential that patient has transition to AML.  Discussed with patient and family and they agreed to transfer.   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 -She is chronically on 2 to 3 L of oxygen -  CT chest: No PE and stable ILD -Currently  on prednisone taper -Continue bronchodilators as needed   Coagulopathy -Noted to have elevated INR - previously received vitamin K -INR ok now   Chronic diastolic congestive heart failure -continue home dose of lasix -Continue beta-blocker, blood pressure readings somewhat suboptimal so no ACE inhibitor/ARB -Continue I/O; currently in a -1900 cc balance -Has lower extremity edema   Diabetes mellitus type 2, well controlled -Takes  metformin at home she is currently on hold -AM CBG down to 57 this morning with p.m. CBG around 9 at 242 -1/12: Will decrease p.m. Lantus from 14 units to 10 units; patient's oral intake has been variable but has improved as of today -Currently on resistant SSI-I have decreased this to sensitive AC/at bedtime -A1c from 4/25 noted to be 6   Hypertension -Continue metoprolol   Splenomegaly -Noted on CT scan, felt to be related to CMML   Physical deconditioning without ambulatory dysfunction -Seen by physical therapy with recommendations for skilled nursing facility -Documented with rapid fatigue during therapy session   Morbid obesity -BMI 37.9   Chronic right hip pain -Patient has a history of right intertrochanteric/subtrochanteric femur fracture in 04/2020 status post intramedullary nail by Dr. Doreatha Martin -She has had many issues since surgery including infection and prolonged antibiotics -She has had continued pain since her fracture-orthopedics this admission.  CT of the hip revealed ununited, comminuted right intertrochanteric fracture with intramedullary nail and and screw fixation. Intact hardware without evidence of loosening. -We recommend to follow-up with orthopedics as an outpatient. -- Continue Ultram, as needed IV morphine and Robaxin, also on Voltaren gel  Procedures: None  Consultations: Oncology Palliative medicine  Discharge Exam: Vitals:   10/20/2020 0519 10/25/2020 0959  BP: (!) 103/54 (!) 111/56  Pulse: 94 (!) 101  Resp: 17  17  Temp: 98.3 F (36.8 C) 98 F (36.7 C)  SpO2: 95% 100%   Constitutional: NAD, calm, peers to be comfortable Respiratory: clear to auscultation bilaterally, no wheezing, no crackles. Normal respiratory effort. No accessory muscle use.  Room air saturations at 5 AM were 95-patient subsequently had been placed on oxygen 3 L and O2 saturations now 100% Cardiovascular: Regular rate and rhythm, no murmurs / rubs / gallops.  Trace bilateral lower extremity edema.  Normotensive with intermittent tachycardia Abdomen: no tenderness, no masses palpated. Bowel sounds positive.  Eating well. LBM 7/10 Neurologic: CN 2-12 grossly intact. Sensation intact, Strength 4/5 x all 4 extremities. Psychiatric: Alert and oriented x3.  Very pleasant affect.    Discharge Instructions    Allergies as of 11/14/2020       Reactions   Other Shortness Of Breath, Other (See Comments)   Newspaper ink =  new chest pain, also   Ativan [lorazepam] Other (See Comments)   Severe confusion and agitation.    Iodine Other (See Comments)   Unknown reaction   Iodine I 131 Tositumomab Other (See Comments)   unknown   Merbromin Other (See Comments)   Unknown reaction - Mercurochrome- "Was a long time ago"    Hydrocodone Anxiety   Confusion, disorientation   Tape Rash        Medication List     STOP taking these medications    Accu-Chek Guide test strip Generic drug: glucose blood   Accu-Chek Softclix Lancets lancets   albuterol 108 (90 Base) MCG/ACT inhaler Commonly known as: VENTOLIN HFA Replaced by: albuterol (2.5 MG/3ML) 0.083% nebulizer solution   BD Pen Needle Nano 2nd Gen  32G X 4 MM Misc Generic drug: Insulin Pen Needle   blood glucose meter kit and supplies Kit   [employer] Tussin DM 10-100 MG/5ML liquid Generic drug: dextromethorphan-guaiFENesin   fluticasone 50 MCG/ACT nasal spray Commonly known as: FLONASE   HYDROcodone-acetaminophen 10-325 MG tablet Commonly known as: Norco   metFORMIN 1000  MG tablet Commonly known as: GLUCOPHAGE   metoprolol succinate 25 MG 24 hr tablet Commonly known as: TOPROL-XL   OXYGEN   triamcinolone cream 0.1 % Commonly known as: KENALOG       TAKE these medications    acetaminophen 325 MG tablet Commonly known as: TYLENOL Take 2 tablets (650 mg total) by mouth every 6 (six) hours as needed for mild pain (or Fever >/= 101). What changed:  medication strength how much to take reasons to take this   albuterol (2.5 MG/3ML) 0.083% nebulizer solution Commonly known as: PROVENTIL Take 3 mLs (2.5 mg total) by nebulization every 6 (six) hours as needed for wheezing or shortness of breath. Replaces: albuterol 108 (90 Base) MCG/ACT inhaler   D-5000 125 MCG (5000 UT) Tabs Generic drug: Cholecalciferol Take 5,000 Units by mouth daily.   diclofenac Sodium 1 % Gel Commonly known as: VOLTAREN Apply 2 g topically 4 (four) times daily -  before meals and at bedtime.   diphenhydrAMINE-zinc acetate cream Commonly known as: BENADRYL Apply topically 3 (three) times daily as needed for itching.   furosemide 20 MG tablet Commonly known as: LASIX Take 2 tablets (40 mg total) by mouth daily.   guaiFENesin 600 MG 12 hr tablet Commonly known as: MUCINEX Take 1 tablet (600 mg total) by mouth 2 (two) times daily.   insulin aspart 100 UNIT/ML injection Commonly known as: novoLOG Inject 0-5 Units into the skin at bedtime. Do NOT hold insulin if patient is NPO.   insulin aspart 100 UNIT/ML injection Commonly known as: novoLOG Inject 0-9 Units into the skin 3 (three) times daily with meals. Inject 0-9 Units into the skin 3 (three) times daily with meals.   linaclotide 145 MCG Caps capsule Commonly known as: LINZESS Take 1 capsule (145 mcg total) by mouth daily before breakfast. Start taking on: October 27, 2020   methocarbamol 500 MG tablet Commonly known as: ROBAXIN Take 1 tablet (500 mg total) by mouth every 6 (six) hours as needed for muscle  spasms.   metoprolol tartrate 25 MG tablet Commonly known as: LOPRESSOR Take 0.5 tablets (12.5 mg total) by mouth 2 (two) times daily.   montelukast 10 MG tablet Commonly known as: SINGULAIR Take 1 tablet (10 mg total) by mouth at bedtime.   morphine 2 MG/ML injection Inject 1 mL (2 mg total) into the vein every 2 (two) hours as needed.   multivitamin with minerals Tabs tablet Take 1 tablet by mouth daily.   olopatadine 0.1 % ophthalmic solution Commonly known as: Patanol Place 1 drop into both eyes 2 (two) times daily. What changed:  when to take this reasons to take this   ondansetron 4 MG tablet Commonly known as: ZOFRAN Take 1 tablet (4 mg total) by mouth every 6 (six) hours as needed for nausea. What changed:  medication strength how much to take when to take this   pregabalin 100 MG capsule Commonly known as: LYRICA Take 100 mg by mouth at bedtime.   romiPLOStim 500 MCG injection Commonly known as: NPLATE Inject 7.001 mLs (789 mcg total) into the skin once a week. Start taking on: November 02, 2020   sertraline  100 MG tablet Commonly known as: ZOLOFT Take 1 tablet (100 mg total) by mouth at bedtime. What changed:  how much to take how to take this when to take this additional instructions   sodium chloride 0.9 % infusion Inject 10 mLs into the vein once for 1 dose.   traMADol 50 MG tablet Commonly known as: ULTRAM Take 1 tablet (50 mg total) by mouth every 6 (six) hours as needed for moderate pain.   traZODone 100 MG tablet Commonly known as: DESYREL Take 1 tablet (100 mg total) by mouth at bedtime.       Allergies  Allergen Reactions   Other Shortness Of Breath and Other (See Comments)    Newspaper ink =  new chest pain, also   Ativan [Lorazepam] Other (See Comments)    Severe confusion and agitation.    Iodine Other (See Comments)    Unknown reaction   Iodine I 131 Tositumomab Other (See Comments)    unknown   Merbromin Other (See Comments)     Unknown reaction - Mercurochrome- "Was a long time ago"    Hydrocodone Anxiety    Confusion, disorientation   Tape Rash      The results of significant diagnostics from this hospitalization (including imaging, microbiology, ancillary and laboratory) are listed below for reference.    Significant Diagnostic Studies: DG Chest 2 View  Result Date: 10/04/2020 CLINICAL DATA:  Shortness of breath and chest tightness EXAM: CHEST - 2 VIEW COMPARISON:  10/01/2020 FINDINGS: Cardiac shadow is enlarged but stable. Bilateral basilar infiltrates are seen although slightly improved when compared with the prior study. No sizable effusion is seen. No acute bony abnormality is seen. IMPRESSION: Improving bibasilar infiltrates when compare with the prior study. Electronically Signed   By: Inez Catalina M.D.   On: 10/04/2020 18:03   DG Chest 2 View  Result Date: 10/01/2020 CLINICAL DATA:  Short of breath.  Cough and fever EXAM: CHEST - 2 VIEW COMPARISON:  09/01/2020 FINDINGS: Bibasilar airspace disease unchanged from the prior study. Upper lobes remain clear. No effusion. IMPRESSION: Bibasilar infiltrates unchanged from 09/01/2020. Possible atelectasis or persistent pneumonia. No effusion. Electronically Signed   By: Franchot Gallo M.D.   On: 10/01/2020 17:37   CT Angio Chest PE W and/or Wo Contrast  Result Date: 10/05/2020 CLINICAL DATA:  PE suspected EXAM: CT ANGIOGRAPHY CHEST WITH CONTRAST TECHNIQUE: Multidetector CT imaging of the chest was performed using the standard protocol during bolus administration of intravenous contrast. Multiplanar CT image reconstructions and MIPs were obtained to evaluate the vascular anatomy. CONTRAST:  147m OMNIPAQUE IOHEXOL 350 MG/ML SOLN COMPARISON:  CT chest pulmonary angiogram, 05/02/2020, CT chest abdomen pelvis angiogram, 08/15/2019 FINDINGS: Cardiovascular: Satisfactory opacification of the pulmonary arteries to the segmental level. No evidence of pulmonary embolism.  Mild cardiomegaly. Enlargement of the main pulmonary artery, measuring up to 3.6 cm in caliber. No pericardial effusion. Aortic atherosclerosis. Mediastinum/Nodes: No enlarged mediastinal, hilar, or axillary lymph nodes. Thyroid gland, trachea, and esophagus demonstrate no significant findings. Lungs/Pleura: Examination of the lung parenchyma is generally somewhat limited by breath motion artifact and expiratory technique. Within this limitation, there appears to be a combination of acute, heterogeneous airspace disease superimposed upon a background of moderate pulmonary fibrosis. Although not well characterized on this examination, predominant characteristics include peribronchovascular irregular peripheral interstitial opacity and ground-glass, some degree of bronchiectasis, and some irregular peripheral interstitial opacity and ground-glass at the bilateral lung bases. General appearance of the lungs is similar when compared to prior  examinations. Trace bilateral pleural effusions. Upper Abdomen: No acute abnormality. Splenomegaly in the included upper abdomen, which is markedly increased compared to prior examination, although incompletely imaged the spleen measures at least 16.7 cm. Musculoskeletal: No chest wall abnormality. No acute or significant osseous findings. Review of the MIP images confirms the above findings. IMPRESSION: 1. Negative examination for pulmonary embolism. 2. Examination of the lung parenchyma is generally somewhat limited by breath motion artifact and expiratory technique. Within this limitation, there appears to be a combination of acute, heterogeneous airspace disease superimposed upon a background of moderate pulmonary fibrosis. Although not well characterized on this examination, predominant characteristics include peribronchovascular irregular peripheral interstitial opacity and ground-glass, some degree of bronchiectasis, and some irregular peripheral interstitial opacity and  ground-glass at the bilateral lung bases. General appearance of the lungs is similar when compared to prior examinations and suggests fibrosis in an "alternative diagnosis" pattern, leading differential consideration fibrotic NSIP. Consider ILD protocol CT to further evaluate at the resolution of acute clinical presentation. 3. Trace bilateral pleural effusions. 4. Splenomegaly in the included upper abdomen, which is markedly increased compared to prior examination, although incompletely imaged the spleen measures at least 16.7 cm. 5. Enlargement of the main pulmonary artery, as can be seen in pulmonary hypertension. 6. Cardiomegaly. Aortic Atherosclerosis (ICD10-I70.0). Electronically Signed   By: Eddie Candle M.D.   On: 10/05/2020 09:21   CT HIP RIGHT WO CONTRAST  Result Date: 10/12/2020 CLINICAL DATA:  Hip fracture EXAM: CT OF THE RIGHT HIP WITHOUT CONTRAST TECHNIQUE: Multidetector CT imaging of the right hip was performed according to the standard protocol. Multiplanar CT image reconstructions were also generated. COMPARISON:  CT 07/07/2020 FINDINGS: Bones/Joint/Cartilage Similar appearance of the ununited, comminuted right inter trochanteric fracture transfixed with an intramedullary nail and interlocking femoral neck screws. There is callus formation without significant bony bridging. Persistent low-attenuation material along the lateral margin of the fracture (axial images 58-65). There is a soft tissue tract extending towards the skin surface, and skin thickening of the lateral thigh. There is no new focal fluid collection. Hardware is intact without evidence of loosening or new fracture. 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 Severe right hip osteoarthritis. There is no hip effusion. Ligaments Suboptimally assessed by CT. Muscles and Tendons No muscle atrophy. Soft tissues No well-defined/rim enhancing fluid collection. IMPRESSION: Similar appearance of the ununited,  comminuted right intertrochanteric fracture with intramedullary nail and and screw fixation. Intact hardware without evidence of loosening. Persistent soft tissue tract extending towards the skin surface with underlying low attenuation material along the lateral margin of the fracture, possibly residual blood products, however infectious process cannot be completely excluded. There is no well-defined/drainable fluid collection. If there is concern for soft tissue infection extending from the fracture site, MRI of the right hip utilizing metal artifact reduction sequences (MARS protocol) may be helpful for further evaluation. Electronically Signed   By: Maurine Simmering   On: 10/12/2020 17:40   DG CHEST PORT 1 VIEW  Result Date: 10/07/2020 CLINICAL DATA:  Shortness of breath EXAM: PORTABLE CHEST 1 VIEW COMPARISON:  10/04/2020, 10/01/2020, 09/01/2020 FINDINGS: Cardiomegaly. Underlying chronic interstitial lung disease. Probable superimposed heterogeneous airspace opacities in the lower lungs without significant change. No pneumothorax. IMPRESSION: Interstitial lung disease with no significant change in probable superimposed heterogeneous airspace opacities. Electronically Signed   By: Donavan Foil M.D.   On: 10/07/2020 18:40    Microbiology: Recent Results (from the past 240 hour(s))  Culture, blood (routine x 2)  Status: None (Preliminary result)   Collection Time: 10/23/20 10:22 PM   Specimen: BLOOD  Result Value Ref Range Status   Specimen Description BLOOD RIGHT ANTECUBITAL  Final   Special Requests   Final    BOTTLES DRAWN AEROBIC AND ANAEROBIC Blood Culture adequate volume   Culture   Final    NO GROWTH 3 DAYS Performed at [facility], [address] [place], [place] [address]    Report Status PENDING  Incomplete  Culture, blood (routine x 2)     Status: None (Preliminary result)   Collection Time: 10/23/20 10:22 PM   Specimen: BLOOD RIGHT HAND  Result Value Ref Range Status    Specimen Description BLOOD RIGHT HAND  Final   Special Requests   Final    BOTTLES DRAWN AEROBIC ONLY Blood Culture adequate volume   Culture   Final    NO GROWTH 3 DAYS Performed at [facility], [place] [address] [place], [place] [address]    Report Status PENDING  Incomplete     Labs: Basic Metabolic Panel: Recent Labs  Lab 10/20/20 0257 10/21/20 0454 10/23/20 0556 10/25/20 0658  NA 137 139 139 136  K 5.1 4.6 4.3 4.4  CL 105 106 104 103  CO2 _0 GLUCOSE 257* 156* 66* 71  BUN 33* 26* 32* 35*  CREATININE 0.71 0.60 0.62 0.71  CALCIUM 9.2 9.3 9.1 8.8*   Liver Function Tests: No results for input(s): AST, ALT, ALKPHOS, BILITOT, PROT, ALBUMIN in the last 168 hours. No results for input(s): LIPASE, AMYLASE in the last 168 hours. No results for input(s): AMMONIA in the last 168 hours. CBC: Recent Labs  Lab 10/21/20 0454 10/23/20 0556 10/23/20 2222 10/24/20 0214 10/25/20 0658  WBC 2.6* 3.4* 4.0 4.4 2.9*  NEUTROABS  --   --   --  0.7*  --   HGB 7.5* 6.1* 7.2* 6.5* 7.9*  HCT 24.2* 18.6* 21.1* 20.0* 23.6*  MCV 95.3 91.6 90.2 90.9 87.1  PLT 23* 23* 22* 20* 19*   Cardiac Enzymes: No results for input(s): CKTOTAL, CKMB, CKMBINDEX, TROPONINI in the last 168 hours. BNP: BNP (last 3 results) Recent Labs    09/14/20 1148 10/01/20 2100 10/04/20 1713  BNP 192.1* 189.6* 500.3*    ProBNP (last 3 results) No results for input(s): PROBNP in the last 8760 hours.  CBG: Recent Labs  Lab 10/25/20 1719 10/25/20 2115 11/11/2020 0641 11/08/2020 0905 10/25/2020 1117  GLUCAP 144* 242* 57* 133* 107*       Signed:  Erin Hearing ANP [facility] 11/01/2020, 2:26 PM

## 2020-10-26 NOTE — Progress Notes (Signed)
Inpatient Diabetes Program Recommendations  AACE/ADA: New Consensus Statement on Inpatient Glycemic Control (2015)  Target Ranges:  Prepandial:   less than 140 mg/dL      Peak postprandial:   less than 180 mg/dL (1-2 hours)      Critically ill patients:  140 - 180 mg/dL   Lab Results  Component Value Date   GLUCAP 107 (H) 11/02/2020   HGBA1C 6.0 (A) 08/09/2020    Review of Glycemic Control Results for KERIE, BADGER (MRN [id number]) as of 10/20/2020 12:49  Ref. Range 10/25/2020 17:19 10/25/2020 21:15 10/24/2020 06:41 10/18/2020 09:05 10/29/2020 11:17  Glucose-Capillary Latest Ref Range: 70 - 99 mg/dL 144 (H) 242 (H) 57 (L) 133 (H) 107 (H)  Diabetes history: DM2 Outpatient Diabetes medications: Metformin 1000 mg QAM Current orders for Inpatient glycemic control: Novolog 0-20 units TID & HS, Lantus 10 units QHS  Inpatient Diabetes Program Recommendations:     Steroids stopped. Fasting CBG=57 mg/dL and patient received no Lantus last night according to documentation. Consider d/c of Lantus and reduce Novolog correction to moderate tid with meals and HS.   Thanks,  Adah Perl, RN, BC-ADM Inpatient Diabetes Coordinator Pager [phone]  (8a-5p)

## 2020-10-26 NOTE — TOC Progression Note (Signed)
Transition of Care [facility]) - Progression Note    Patient Details  Name: Kathy Irwin MRN: [id number] Date of Birth: 1951-01-29  Transition of Care [facility]) CM/SW [place], RN Phone Number: 10/23/2020, 2:16 PM  Clinical Narrative:    Case management received report from Crawford Givens, MSW regarding transitions of care needs.  The patient is requesting oncology treatment and transfer to [facility] for consult and follow up outside of [facility] at this time.  Erin Hearing, NP spoke with the family at the bedside after speaking with oncology physician at [facility] is willing to accept the patient for admission but currently does not have an available admission bed at this time.  Bedside nursing is aware.  I called and left a message with Aundra Dubin, RNCM with [place] Case management at [phone] and [phone] and left a secure voicemail notifying her that the patient will transfer to [facility] when an admission bed is available.   Expected Discharge Plan: Skilled Nursing Facility Barriers to Discharge: Continued Medical Work up  Expected Discharge Plan and Services Expected Discharge Plan: [place] In-house Referral: Clinical Social Work     Living arrangements for the past 2 months: Single Family Home                                       Social Determinants of Health (SDOH) Interventions    Readmission Risk Interventions Readmission Risk Prevention Plan 08/12/2019  Transportation Screening Complete  PCP or Specialist Appt within 3-5 Days Complete  HRI or [place] Complete  Social Work Consult for [place] Planning/Counseling Complete  Palliative Care Screening Not Applicable  Medication Review Press photographer) Complete  Some recent data might be hidden

## 2020-10-27 LAB — TYPE AND SCREEN
ABO/RH(D): AB POS
Antibody Screen: NEGATIVE
Unit division: 0
Unit division: 0
Unit division: 0
Unit division: 0

## 2020-10-27 LAB — BPAM RBC
Blood Product Expiration Date: 202207262359
Blood Product Expiration Date: 202207292359
Blood Product Expiration Date: 202207292359
Blood Product Expiration Date: 202208032359
ISSUE DATE / TIME: 202207091522
ISSUE DATE / TIME: 202207100407
ISSUE DATE / TIME: 202207101103
Unit Type and Rh: 6200
Unit Type and Rh: 6200
Unit Type and Rh: 7300
Unit Type and Rh: 7300

## 2020-10-27 LAB — CBC WITH DIFFERENTIAL/PLATELET
Abs Immature Granulocytes: 0 10*3/uL (ref 0.00–0.07)
Basophils Absolute: 0 10*3/uL (ref 0.0–0.1)
Basophils Relative: 0 %
Eosinophils Absolute: 0.1 10*3/uL (ref 0.0–0.5)
Eosinophils Relative: 1 %
HCT: 20.1 % — ABNORMAL LOW (ref 36.0–46.0)
Hemoglobin: 6.7 g/dL — CL (ref 12.0–15.0)
Immature Granulocytes: 0 %
Lymphocytes Relative: 59 %
Lymphs Abs: 2.3 10*3/uL (ref 0.7–4.0)
MCH: 29.5 pg (ref 26.0–34.0)
MCHC: 33.3 g/dL (ref 30.0–36.0)
MCV: 88.5 fL (ref 80.0–100.0)
Monocytes Absolute: 1.2 10*3/uL — ABNORMAL HIGH (ref 0.1–1.0)
Monocytes Relative: 31 %
Neutro Abs: 0.4 10*3/uL — CL (ref 1.7–7.7)
Neutrophils Relative %: 9 %
Platelets: 20 10*3/uL — CL (ref 150–400)
RBC: 2.27 MIL/uL — ABNORMAL LOW (ref 3.87–5.11)
RDW: 16.5 % — ABNORMAL HIGH (ref 11.5–15.5)
WBC: 3.9 10*3/uL — ABNORMAL LOW (ref 4.0–10.5)
nRBC: 7.5 % — ABNORMAL HIGH (ref 0.0–0.2)

## 2020-10-27 LAB — GLUCOSE, CAPILLARY
Glucose-Capillary: 80 mg/dL (ref 70–99)
Glucose-Capillary: 113 mg/dL — ABNORMAL HIGH (ref 70–99)
Glucose-Capillary: 104 mg/dL — ABNORMAL HIGH (ref 70–99)
Glucose-Capillary: 104 mg/dL — ABNORMAL HIGH (ref 70–99)

## 2020-10-27 LAB — PREPARE RBC (CROSSMATCH)

## 2020-10-27 MED ORDER — SODIUM CHLORIDE 0.9% IV SOLUTION: Freq: Once | INTRAVENOUS | Status: AC

## 2020-10-27 MED ORDER — METHOCARBAMOL 500 MG PO TABS: 1000.0000 mg | ORAL_TABLET | Freq: Four times a day (QID) | ORAL | Status: DC | PRN

## 2020-10-27 MED ORDER — CYCLOBENZAPRINE HCL 5 MG PO TABS
5.0000 mg | ORAL_TABLET | Freq: Three times a day (TID) | ORAL | Status: DC | PRN
  Administered 2020-10-27: 5 mg via ORAL
  Filled 2020-10-27: qty 1

## 2020-10-27 NOTE — Progress Notes (Signed)
PROGRESS NOTE    Kathy Irwin  IHK:[id number] DOB: Dec 09, 1950 DOA: 10/04/2020 PCP: Kathy Perna, NP   Chief Complain:  Brief Narrative:  Patient is a 70 year old female with history of CMML, chronic ITP, diastolic heart failure, diabetes, hypertension, presents to the hospital with complaints of shortness of breath.  As per the report,he was noted to be getting dizzy and breaking out in a sweat when she was standing up.  Hemoglobin noted to be 5 on admission.  She was also significantly thrombocytopenic.  CTA chest negative for PE.  She was transfused PRBC and oncology was consulted.  Hospital course remarkable for requiring several units of blood transfusion for persistent anemia.  Oncology following and recommended hospice approach given her significant burden of oncology cares and limited likelihood of immediate improvement of her bone marrow functioning.  Patient declined hospice and wants to continue transfusion and Vidaza/Nplate therapy..  Prolonged hospital course requiring frequent transfusion for low hemoglobin.  PT/OT recommending skilled nursing facility . Per family request for second opinion, patient will be [name].  She has been accepted into oncology service of Dr. Dario Ave.  She can be transferred whenever bed is available over there.  Assessment & Plan:   Active Problems:   Type 2 diabetes mellitus with complication, without long-term current use of insulin (HCC)   HTN (hypertension)   Acute on chronic respiratory failure with hypoxia (HCC)   Obesity (BMI 30-39.9)   Chronic diastolic CHF (congestive heart failure) (HCC)   HCAP (healthcare-associated pneumonia)   Hyperbilirubinemia   Idiopathic thrombocytopenia (HCC)   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HCC)   Symptomatic anemia   Symptomatic anemia/pancytopenia, -Secondary to  chronic myelomonocytic leukemia, chemotherapy, chronic ITP -Hemoglobin of 5.1 on admission   -She has received a total of 11 units prbc thus far , given a unit today -Transfuse if hemoglobin less than 7.5 -Oncology discussed moving forward with hospice but patient declined - we   consulted palliative care. -Palliative care discussed with the patient and she wants to remain full code, continue full scope treatment, palliative care  recommended skilled nursing facility with palliative care service follow-up.  CMML -Status post 2 cycles of 5 azacitidine -Since she declined hospice approach, oncology recommended to follow-up as an outpatient for rescheduling her cycle 3 of Vidaza, weekly Nplate -But family wanted a second opinion and get her transferred to [facility].  Thrombocytopenia -Likely secondary to above CMML versus chemotherapy, she also has a component of ITP -She received Nplate (Romiplostim) 7/56, repeated 6/28 -Transfuse with platelets if less than 10K or active bleeding   Acute on chronic respiratory failure with hypoxia -She is chronically on 2 to 3 L of oxygen -CT chest was negative for pulmonary embolus -CT did comment on possible interstitial lung disease versus pneumonia -She has completed antibiotic course -Currently on prednisone taper -Continue bronchodilators as needed -Currently respiratory status is stable.   Coagulopathy -Noted to have elevated INR - previously received vitamin K -INR ok now   Chronic diastolic congestive heart failure -continue home dose of lasix -continue to monitor volume status -Has trace lower extremity edema   Diabetes mellitus type 2, well controlled -Takes  metformin at home -Continue on SSI,lantus  -A1c from 4/25 noted to be 6 -Monitor blood sugar   Hypertension -Continue metoprolol   Splenomegaly -Noted on CT scan, felt to be related to CMML   Generalized weakness -Seen by physical therapy with recommendations for skilled nursing facility  Morbid obesity -BMI 37.9   Right hip pain -Patient has a history  of right intertrochanteric/subtrochanteric femur fracture in 04/2020 status post intramedullary nail by Dr. Doreatha Martin -She has had many issues since surgery including infection and prolonged antibiotics -She has had continued pain since her fracture -Since she is 5 months out from her fracture, orthopedics had ordered CT of her hip for further evaluation which showed ununited, comminuted right intertrochanteric fracture with intramedullary nail and and screw fixation. Intact hardware without evidence of loosening. -We recommend to follow-up with orthopedics as an outpatient. --She is currently ambulatory           DVT prophylaxis:SCD Code Status: Full Family Communication: None at bedside Status is: Inpatient    Dispo: The patient is from: Home              Anticipated d/c is to: Transfer to [facility]              Patient currently is medically stable for transfer   Difficult to place patient yes      Consultants: Oncology  Procedures:None  Antimicrobials:  Anti-infectives (From admission, onward)    Start     Dose/Rate Route Frequency Ordered Stop   10/15/20 1800  fluconazole (DIFLUCAN) tablet 150 mg        150 mg Oral  Once 10/15/20 1710 10/15/20 1937   10/05/20 1200  cefTRIAXone (ROCEPHIN) 2 g in sodium chloride 0.9 % 100 mL IVPB        2 g 200 mL/hr over 30 Minutes Intravenous Every 24 hours 10/05/20 1144 10/09/20 1235   10/05/20 1145  azithromycin (ZITHROMAX) tablet 500 mg        500 mg Oral Daily 10/05/20 1144 10/09/20 0840       Subjective:  Patient seen and examined at the bedside today.  Denies any complaints.  Sitting on the bed.  Hemoglobin dropped to the range of 6 again, being transfused  Objective: Vitals:   11/03/2020 2119 10/27/20 0500 10/27/20 1015 10/27/20 1042  BP: (!) 119/56 (!) 100/52 (!) 127/54 127/63  Pulse: (!) 109 90 91 85  Resp: 18   18  Temp: 98.5 F (36.9 C)  98.1 F (36.7 C) 98.3 F (36.8 C)  TempSrc: Oral  Oral Oral  SpO2: 98% 97%  100% 100%  Weight: 98.6 kg     Height:        Intake/Output Summary (Last 24 hours) at 10/27/2020 1300 Last data filed at 10/27/2020 1243 Gross per 24 hour  Intake 120 ml  Output 1900 ml  Net -1780 ml   Filed Weights   10/25/20 0650 10/25/20 2125 10/30/2020 2119  Weight: 98.6 kg 98.6 kg 98.6 kg    Examination:    General exam: Overall comfortable, not in distress, morbidly obese HEENT: PERRL Respiratory system:  no wheezes or crackles  Cardiovascular system: S1 & S2 heard, RRR.  Gastrointestinal system: Abdomen is nondistended, soft and nontender. Central nervous system: Alert and oriented Extremities: Trace bilateral lower extremity edema, no clubbing ,no cyanosis Skin: No rashes, no ulcers,no icterus    Data Reviewed: I have personally reviewed following labs and imaging studies  CBC: Recent Labs  Lab 10/23/20 0556 10/23/20 2222 10/24/20 0214 10/25/20 0658 10/27/20 0335  WBC 3.4* 4.0 4.4 2.9* 3.9*  NEUTROABS  --   --  0.7*  --  0.4*  HGB 6.1* 7.2* 6.5* 7.9* 6.7*  HCT 18.6* 21.1* 20.0* 23.6* 20.1*  MCV 91.6 90.2 90.9 87.1 88.5  PLT  23* 22* 20* 19* 20*   Basic Metabolic Panel: Recent Labs  Lab 10/21/20 0454 10/23/20 0556 10/25/20 0658  NA 139 139 136  K 4.6 4.3 4.4  CL 106 104 103  CO2 _0 GLUCOSE 156* 66* 71  BUN 26* 32* 35*  CREATININE 0.60 0.62 0.71  CALCIUM 9.3 9.1 8.8*   GFR: Estimated Creatinine Clearance: 74.7 mL/min (by C-G formula based on SCr of 0.71 mg/dL). Liver Function Tests: No results for input(s): AST, ALT, ALKPHOS, BILITOT, PROT, ALBUMIN in the last 168 hours. No results for input(s): LIPASE, AMYLASE in the last 168 hours. No results for input(s): AMMONIA in the last 168 hours. Coagulation Profile: No results for input(s): INR, PROTIME in the last 168 hours. Cardiac Enzymes: No results for input(s): CKTOTAL, CKMB, CKMBINDEX, TROPONINI in the last 168 hours. BNP (last 3 results) No results for input(s): PROBNP in the last 8760  hours. HbA1C: Recent Labs    10/30/2020 1419  HGBA1C 7.0*   CBG: Recent Labs  Lab 10/19/2020 1117 11/10/2020 1618 11/01/2020 2121 10/27/20 0648 10/27/20 1125  GLUCAP 107* 131* 322* 80 113*   Lipid Profile: No results for input(s): CHOL, HDL, LDLCALC, TRIG, CHOLHDL, LDLDIRECT in the last 72 hours. Thyroid Function Tests: No results for input(s): TSH, T4TOTAL, FREET4, T3FREE, THYROIDAB in the last 72 hours. Anemia Panel: No results for input(s): VITAMINB12, FOLATE, FERRITIN, TIBC, IRON, RETICCTPCT in the last 72 hours. Sepsis Labs: Recent Labs  Lab 10/23/20 2222 10/24/20 0214  LATICACIDVEN 1.6 1.3    Recent Results (from the past 240 hour(s))  Culture, blood (routine x 2)     Status: None (Preliminary result)   Collection Time: 10/23/20 10:22 PM   Specimen: BLOOD  Result Value Ref Range Status   Specimen Description BLOOD RIGHT ANTECUBITAL  Final   Special Requests   Final    BOTTLES DRAWN AEROBIC AND ANAEROBIC Blood Culture adequate volume   Culture   Final    NO GROWTH 4 DAYS Performed at [facility], [place] [address], [place], [place] [address]    Report Status PENDING  Incomplete  Culture, blood (routine x 2)     Status: None (Preliminary result)   Collection Time: 10/23/20 10:22 PM   Specimen: BLOOD RIGHT HAND  Result Value Ref Range Status   Specimen Description BLOOD RIGHT HAND  Final   Special Requests   Final    BOTTLES DRAWN AEROBIC ONLY Blood Culture adequate volume   Culture   Final    NO GROWTH 4 DAYS Performed at [facility], [place] [address], [place],  [address]    Report Status PENDING  Incomplete         Radiology Studies: No results found.      Scheduled Meds:  sodium chloride   Intravenous Once   diclofenac Sodium  2 g Topical TID AC & HS   furosemide  40 mg Oral Daily   guaiFENesin  600 mg Oral BID   insulin aspart  0-5 Units Subcutaneous QHS   insulin aspart  0-9 Units Subcutaneous TID WC   linaclotide  145 mcg  Oral QAC breakfast   metoprolol tartrate  12.5 mg Oral BID   montelukast  10 mg Oral QHS   olopatadine  1 drop Both Eyes BID   pregabalin  100 mg Oral QHS   romiPLOStim  8 mcg/kg Subcutaneous Weekly   sertraline  100 mg Oral QHS   sodium chloride flush  3 mL  Intravenous Q12H   traZODone  100 mg Oral QHS   Continuous Infusions:   LOS: 22 days    Time spent: 25 mins.More than 50% of that time was spent in counseling and/or coordination of care.      Shelly Coss, MD [facility] P7/13/2022, 1:00 PM

## 2020-10-28 ENCOUNTER — Inpatient Hospital Stay (HOSPITAL_COMMUNITY): Payer: 59

## 2020-10-28 LAB — PATHOLOGIST SMEAR REVIEW

## 2020-10-28 LAB — URINALYSIS, ROUTINE W REFLEX MICROSCOPIC
Bilirubin Urine: NEGATIVE
Glucose, UA: NEGATIVE mg/dL
Hgb urine dipstick: NEGATIVE
Ketones, ur: NEGATIVE mg/dL
Leukocytes,Ua: NEGATIVE
Nitrite: NEGATIVE
Protein, ur: NEGATIVE mg/dL
Specific Gravity, Urine: 1.009 (ref 1.005–1.030)
pH: 6 (ref 5.0–8.0)

## 2020-10-28 LAB — CULTURE, BLOOD (ROUTINE X 2)
Culture: NO GROWTH
Culture: NO GROWTH
Special Requests: ADEQUATE
Special Requests: ADEQUATE

## 2020-10-28 LAB — GLUCOSE, CAPILLARY
Glucose-Capillary: 219 mg/dL — ABNORMAL HIGH (ref 70–99)
Glucose-Capillary: 111 mg/dL — ABNORMAL HIGH (ref 70–99)
Glucose-Capillary: 102 mg/dL — ABNORMAL HIGH (ref 70–99)

## 2020-10-28 NOTE — Progress Notes (Signed)
Occupational Therapy Treatment Patient Details Name: Kathy Irwin MRN: [id number] DOB: Jun 27, 1950 Today's Date: 10/28/2020    History of present illness 70 y.o. female presents to ED 10/04/20 with continued complaints of SoB and chest tightness. Presented to [facility] with similar complaints 6/17 In ED  HGB 5.1 and received 2 units of PRBC Chest x-ray noted improving bibasilar infiltrates compared to x-ray from 6/17 CT of the chest performed which was negative for PE but did show increase in her splenomegaly. 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  She last had received infusion with romiplostim on 6/14.   OT comments  Pt presented with PT seated EOB preparing for chair transfer. Pt observed to be lethargic requiring cueing for alertness. Pt set up for grooming to wash face and min A for self feeding in recliner. Pt observed to be limited with presenting cup to mouth due to weakness and increased R hand tremor. Session limited due to X-ray. Pt is still limited with activity tolerance, pain, and weakness and will benefit to continue skilled level OT to address established deficits to maximize independence with ADLs. DC anf Frequency remains the same.    Follow Up Recommendations  SNF (needs snf but insurance will not pay.)    Equipment Recommendations  None recommended by OT    Recommendations for Other Services      Precautions / Restrictions Precautions Precautions: Fall (Pt reports 1 fall in the past 6 months) Precaution Comments: watch O2; soft BPs       Mobility Bed Mobility Overal bed mobility: Needs Assistance             General bed mobility comments: Pt received seated EOB with PT and rehab tech.    Transfers Overall transfer level: Needs assistance   Transfers: Comptroller  transfers: Max assist;+2 physical assistance (Scootpad required for posterior transition to sit in recliner.)   General transfer comment: +2 transfer performed with PT and rehab with stand pivot from bed to recliner, with observance heavy cueing for hand placement and sequencing due to increased assistance this session.    Balance Overall balance assessment: Needs assistance Sitting-balance support: Feet supported Sitting balance-Leahy Scale: Good     Standing balance support: Bilateral upper extremity supported;During functional activity Standing balance-Leahy Scale: Poor Standing balance comment: heavy reliance on BUE support in standing                           ADL either performed or assessed with clinical judgement   ADL Overall ADL's : Needs assistance/impaired Eating/Feeding: Minimal assistance Eating/Feeding Details (indicate cue type and reason): due to level of alertness, while seated in recliner pt unable to open bag of chip or straw to place in cup of water. Pt limited wtih presenting cup to mouth. Required assists to steady cup to drink. Grooming: Set up;Sitting Grooming Details (indicate cue type and reason): wash cloth set up for pt while seated in recliner pt able to demonstrate light washing of her face however, RUE tremor observed during task.                               General ADL Comments: pt with decreased activity tolerance, generalized weakness and level  of alertness this session. Session limited due to X ray     Vision   Vision Assessment?: No apparent visual deficits   Perception     Praxis      Cognition Arousal/Alertness: Lethargic (really difficult staying alert this session.) Behavior During Therapy: WFL for tasks assessed/performed Overall Cognitive Status: Within Functional Limits for tasks assessed                                 General Comments: pt with decreased safety awareness, did not formally  assess.        Exercises     Shoulder Instructions       General Comments      Pertinent Vitals/ Pain       Pain Assessment: Faces Faces Pain Scale: Hurts little more Pain Location: r hip, bottom Pain Descriptors / Indicators: Grimacing;Guarding Pain Intervention(s): Monitored during session;Repositioned  Home Living                                          Prior Functioning/Environment              Frequency  Min 2X/week        Progress Toward Goals  OT Goals(current goals can now be found in the care plan section)  Progress towards OT goals: Not progressing toward goals - comment (unable to determine this session due to level of alertness and participation in session.)  Acute Rehab OT Goals Patient Stated Goal: get stronger, and be able to safely get home OT Goal Formulation: With patient Time For Goal Achievement: 11/03/20 Potential to Achieve Goals: Good ADL Goals Pt Will Perform Grooming: with supervision;sitting;standing Pt Will Perform Lower Body Dressing: with min assist;with adaptive equipment;sit to/from stand Pt Will Transfer to Toilet: with supervision;ambulating Pt Will Perform Toileting - Clothing Manipulation and hygiene: with min guard assist;sit to/from stand Additional ADL Goal #1: Pt will state 3 energy conservation strategies in order to increase independence during grooming tasks and ADL routine Additional ADL Goal #2: Pt will perform OOB ADL tasks x10 min self monitoring O2 sats >90%  Plan Discharge plan remains appropriate    Co-evaluation                 AM-PAC OT "6 Clicks" Daily Activity     Outcome Measure   Help from another person eating meals?: A Little Help from another person taking care of personal grooming?: A Little Help from another person toileting, which includes using toliet, bedpan, or urinal?: A Little Help from another person bathing (including washing, rinsing, drying)?: A Little Help  from another person to put on and taking off regular upper body clothing?: A Little Help from another person to put on and taking off regular lower body clothing?: A Lot 6 Click Score: 17    End of Session Equipment Utilized During Treatment: Gait belt;Oxygen  OT Visit Diagnosis: Unsteadiness on feet (R26.81);Muscle weakness (generalized) (M62.81)   Activity Tolerance Patient limited by fatigue   Patient Left in chair;with call bell/phone within reach;with chair alarm set   Nurse Communication Mobility status        Time: 1152-1208 OT Time Calculation (min): 16 min  Charges: OT General Charges $OT Visit: 1 Visit OT Treatments $Self Care/Home Management : 8-22 mins  Minus Breeding, MSOT, OTR/L  Supplemental  Rehabilitation Services  [phone]    Marius Ditch 10/28/2020, 12:45 PM

## 2020-10-28 NOTE — Progress Notes (Signed)
  Patient seen and examined  atthe bedside this morning. She had mild grade fever today which later resolved with Tylenol. She denies shortness of breath or cough, denies any dysuria. She is waiting for transfer to [facility].  She will be transferred as soon as bed is available.  We do not have any new change in the medical management. Discharge summary and orders are already in.

## 2020-10-28 NOTE — Progress Notes (Addendum)
Physical Therapy Treatment Patient Details Name: Kathy Irwin MRN: [id number] DOB: [birth year] Today's Date: 10/28/2020    History of Present Illness 70 y.o. female presents to ED 10/04/20 with continued complaints of SoB and chest tightness. Presented to [facility] with similar complaints 6/17 In ED  HGB 5.1 and received 2 units of PRBC Chest x-ray noted improving bibasilar infiltrates compared to x-ray from 6/17 CT of the chest performed which was negative for PE but did show increase in her splenomegaly. 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  She last had received infusion with romiplostim on 6/14.    PT Comments    Pt supine in bed on entry requiring increase verbal and tactile stimulation to rouse. Pt agreeable to work with therapy by has difficulty keeping her eyes open. Pt requires maximal multimodal cuing throughout. MinA for coming to seated EoB, and maxAx2 for pivot to recliner for physical assist and for releasing pt grasp on Dynamap so that she could pivot. Pt did not respond to vc. Pt is in process of transfer to [facility] for 2nd opinion. PT will continue to follow acutely.   Follow Up Recommendations  SNF     Equipment Recommendations  Wheelchair (measurements PT);Wheelchair cushion (measurements PT)       Precautions / Restrictions Precautions Precautions: Fall (Pt reports 1 fall in the past 6 months) Precaution Comments: watch O2; soft BPs Restrictions Weight Bearing Restrictions: No    Mobility  Bed Mobility Overal bed mobility: Needs Assistance Bed Mobility: Supine to Sit     Supine to sit: Min assist     General bed mobility comments: min A and maximal cuing due to pt lethargy    Transfers Overall transfer level: Needs assistance       Stand pivot transfers: +2 physical assistance;From elevated  surface       General transfer comment: max physical Ax2 and maximal cuing for sequencing stand pivot to chair, pt reaching out and holding onto Dynamap despite maximal vc pt ultimately requiring OT to physically release grasp so that she could be pivoted,         Balance Overall balance assessment: Needs assistance Sitting-balance support: Feet supported Sitting balance-Leahy Scale: Good     Standing balance support: Bilateral upper extremity supported;During functional activity Standing balance-Leahy Scale: Poor Standing balance comment: heavy reliance on BUE support in standing                            Cognition Arousal/Alertness: Lethargic (really difficult staying alert this session.) Behavior During Therapy: WFL for tasks assessed/performed Overall Cognitive Status: Within Functional Limits for tasks assessed                                 General Comments: pt with decreased safety awareness, did not formally assess.         General Comments General comments (skin integrity, edema, etc.): Pt continues to have soft BP supine 91/49, seated 95/43 after transfer 115/45 SaO2 on 2.5 L 98-100%O2      Pertinent Vitals/Pain Pain Assessment: Faces Faces Pain Scale: Hurts little more Pain Location: r hip, bottom Pain Descriptors / Indicators: Grimacing;Guarding Pain Intervention(s): Monitored during session     PT Goals (current goals  can now be found in the care plan section) Acute Rehab PT Goals Patient Stated Goal: get stronger, and be able to safely get home PT Goal Formulation: With patient Time For Goal Achievement: 11/07/20 Progress towards PT goals: Not progressing toward goals - comment    Frequency    Min 3X/week      PT Plan Current plan remains appropriate;Other (comment) (however pt request transfer to [facility] for second opinion)       AM-PAC PT "6 Clicks" Mobility   Outcome Measure  Help needed turning from your  back to your side while in a flat bed without using bedrails?: None Help needed moving from lying on your back to sitting on the side of a flat bed without using bedrails?: A Little Help needed moving to and from a bed to a chair (including a wheelchair)?: Total Help needed standing up from a chair using your arms (e.g., wheelchair or bedside chair)?: Total Help needed to walk in hospital room?: Total Help needed climbing 3-5 steps with a railing? : Total 6 Click Score: 11    End of Session Equipment Utilized During Treatment: Gait belt;Oxygen Activity Tolerance: Patient limited by lethargy Patient left: in chair;with call bell/phone within reach;Other (comment);with chair alarm set (working with OT) Nurse Communication: Mobility status PT Visit Diagnosis: Other abnormalities of gait and mobility (R26.89);Muscle weakness (generalized) (M62.81);Difficulty in walking, not elsewhere classified (R26.2);Pain Pain - Right/Left: Right Pain - part of body: Hip     Time: 3818-2993 PT Time Calculation (min) (ACUTE ONLY): 18 min  Charges:  $Therapeutic Exercise: 8-22 mins                     Andrey Hoobler B. Migdalia Dk PT, DPT Acute Rehabilitation Services Pager [phone] Office [phone]    [place] 10/28/2020, 4:24 PM

## 2020-10-29 ENCOUNTER — Other Ambulatory Visit (INDEPENDENT_AMBULATORY_CARE_PROVIDER_SITE_OTHER): Payer: Self-pay | Admitting: Primary Care

## 2020-10-29 LAB — TYPE AND SCREEN
ABO/RH(D): AB POS
Antibody Screen: NEGATIVE
Unit division: 0

## 2020-10-29 LAB — BPAM RBC
Blood Product Expiration Date: 202208042359
ISSUE DATE / TIME: 202207131024
Unit Type and Rh: 6200

## 2020-10-29 NOTE — Telephone Encounter (Signed)
   Notes to clinic:  The original prescription was discontinued on 09/02/2020 by Kerin Perna, NP. Renewing this prescription may not be appropriate   Requested Prescriptions  Pending Prescriptions Disp Refills   pravastatin (PRAVACHOL) 40 MG tablet [Pharmacy Med Name: PRAVASTATIN SODIUM 40 MG TAB] 90 tablet 1    Sig: TAKE 1 TABLET BY MOUTH EVERYDAY AT BEDTIME      Cardiovascular:  Antilipid - Statins Failed - 10/29/2020 12:05 AM      Failed - Total Cholesterol in normal range and within 360 days    Cholesterol, Total  Date Value Ref Range Status  08/31/2020 90 (L) 100 - 199 mg/dL Final          Failed - HDL in normal range and within 360 days    HDL  Date Value Ref Range Status  08/31/2020 10 (L) >39 mg/dL Final          Failed - Triglycerides in normal range and within 360 days    Triglycerides  Date Value Ref Range Status  08/31/2020 174 (H) 0 - 149 mg/dL Final          Passed - LDL in normal range and within 360 days    LDL Chol Calc (NIH)  Date Value Ref Range Status  08/31/2020 50 0 - 99 mg/dL Final          Passed - Patient is not pregnant      Passed - Valid encounter within last 12 months    Recent Outpatient Visits           1 month ago Mixed hyperlipidemia   [employer], Michelle P, NP   2 months ago Type 2 diabetes mellitus without complication, without long-term current use of insulin ([place])   [place] [facility] Juluis Mire P, NP   6 months ago OAB (overactive bladder)   [facility] Juluis Mire P, NP   8 months ago Chronic respiratory failure with hypoxia ([place])   [place] [facility] Juluis Mire P, NP   8 months ago Type 2 diabetes mellitus without complication, without long-term current use of insulin ([place])   [facility] Kerin Perna, NP

## 2020-11-02 LAB — CULTURE, BLOOD (ROUTINE X 2)
Culture: NO GROWTH
Culture: NO GROWTH
Special Requests: ADEQUATE
Special Requests: ADEQUATE

## 2020-11-09 ENCOUNTER — Other Ambulatory Visit (INDEPENDENT_AMBULATORY_CARE_PROVIDER_SITE_OTHER): Payer: Self-pay | Admitting: Primary Care

## 2020-11-15 DEATH — deceased

## 2020-11-19 ENCOUNTER — Encounter (INDEPENDENT_AMBULATORY_CARE_PROVIDER_SITE_OTHER): Payer: Self-pay | Admitting: Primary Care

## 2022-02-10 IMAGING — CT CT HIP*R* W/O CM
2 of 4 series · 14 of 46 positions shown, 16 images · non-contrast
Comparison: CT 07/07/2020

CLINICAL DATA: Hip fracture

EXAM:
CT OF THE RIGHT HIP WITHOUT CONTRAST
TECHNIQUE: Multidetector CT imaging of the right hip was performed according to
the standard protocol. Multiplanar CT image reconstructions were
also generated.

[Series 9: coronal st · coronal · 0.52mm/px · 3 of 191 slices shown]
[im 64/191  soft-tissue]
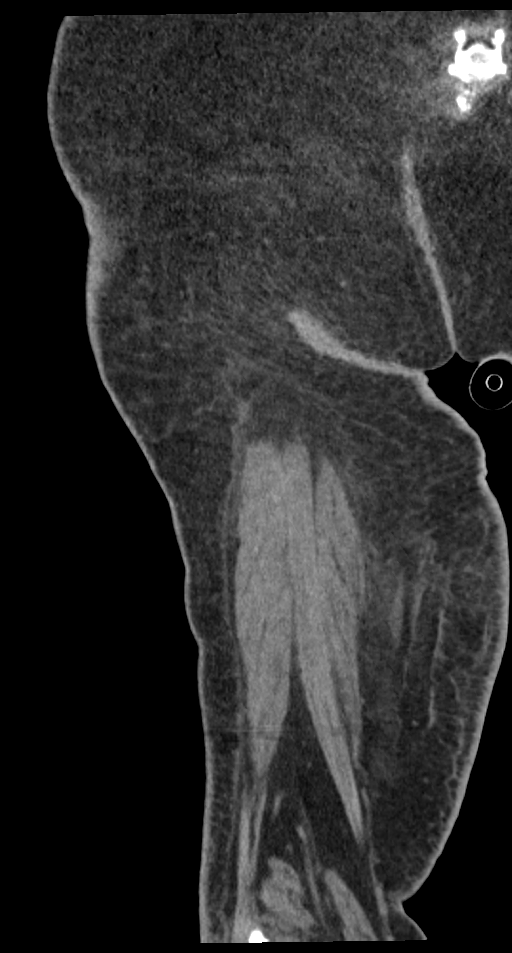
[im 85/191  soft-tissue]
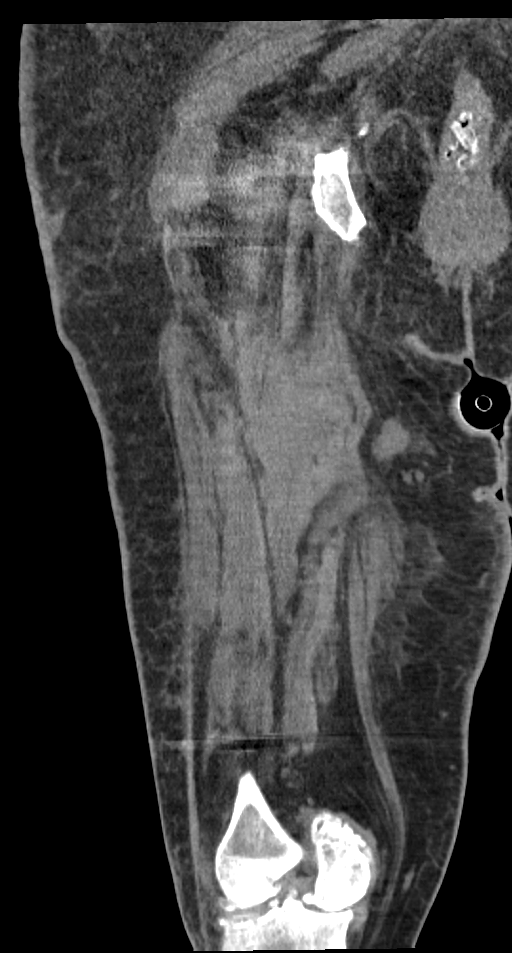
[im 106/191  soft-tissue]
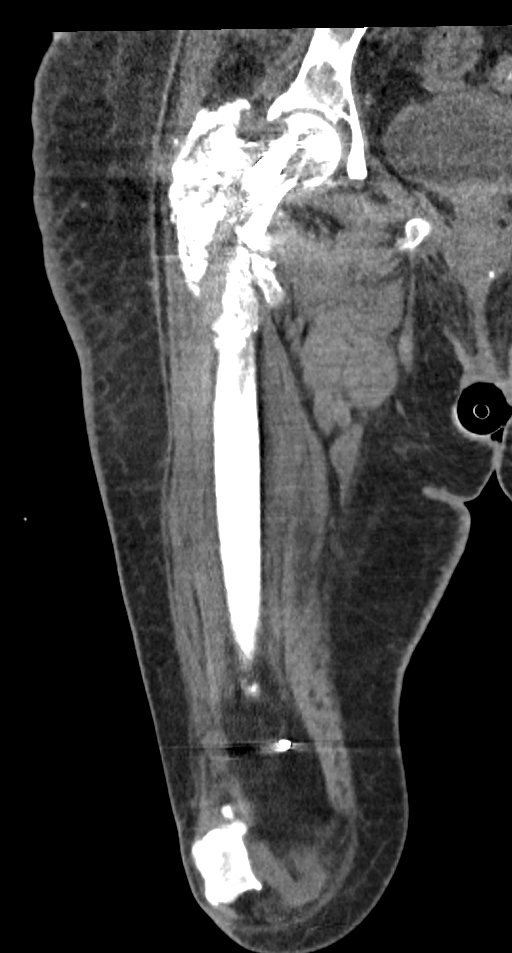

[Series 11: pelvis thin (person_name) · axial · 0.56mm/px · z∈[+652,+1048]mm · 11 of 794 slices shown, 13 images]
[im 67/794  soft-tissue]
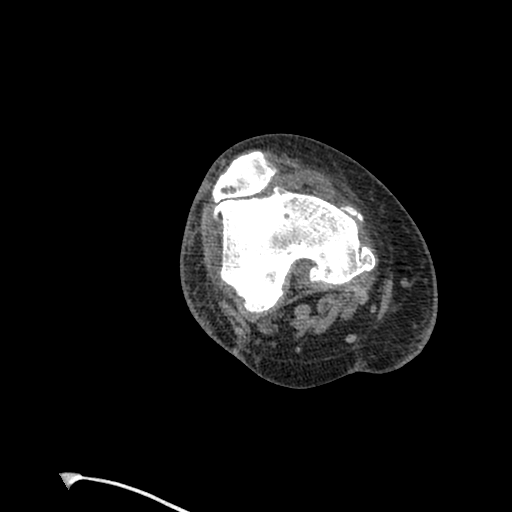
[im 67/794  bone]
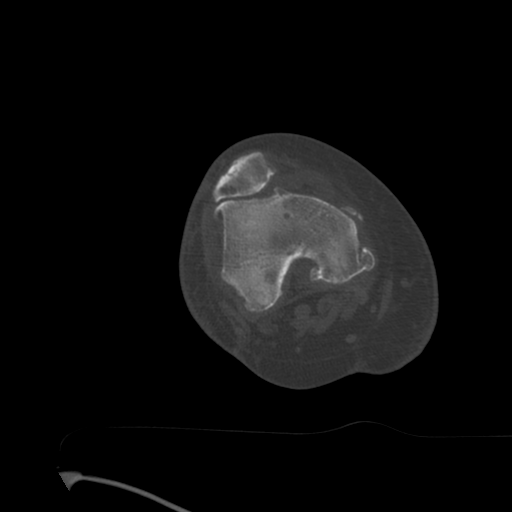
[im 133/794  soft-tissue]
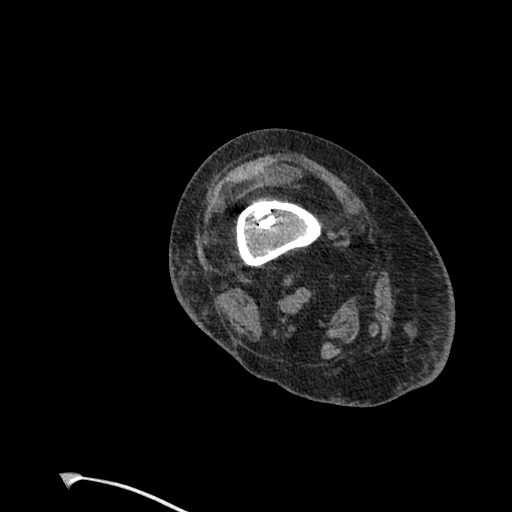
[im 199/794  soft-tissue]
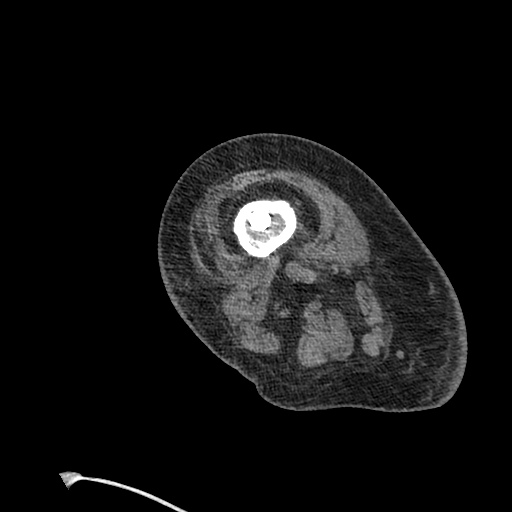
[im 265/794  soft-tissue]
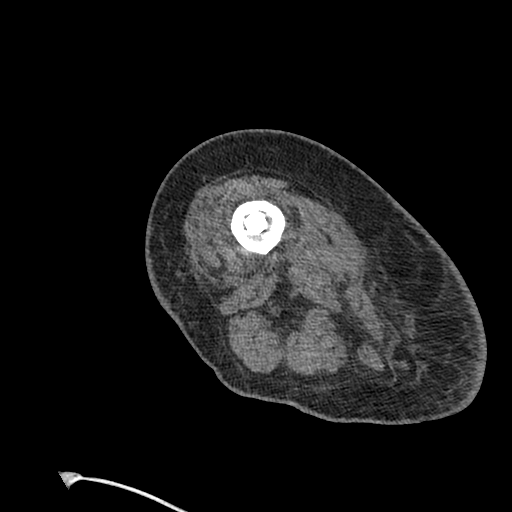
[im 331/794  soft-tissue]
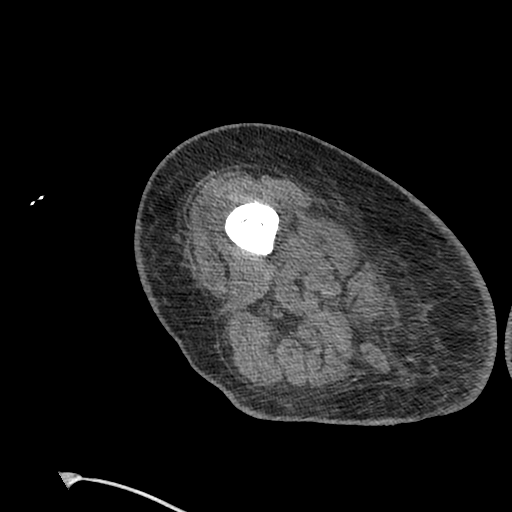
[im 397/794  soft-tissue]
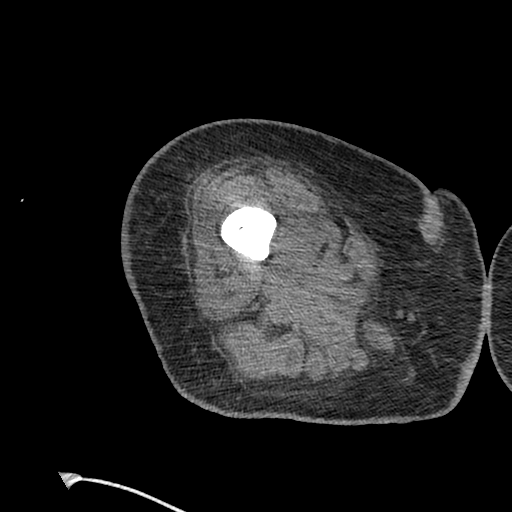
[im 463/794  soft-tissue]
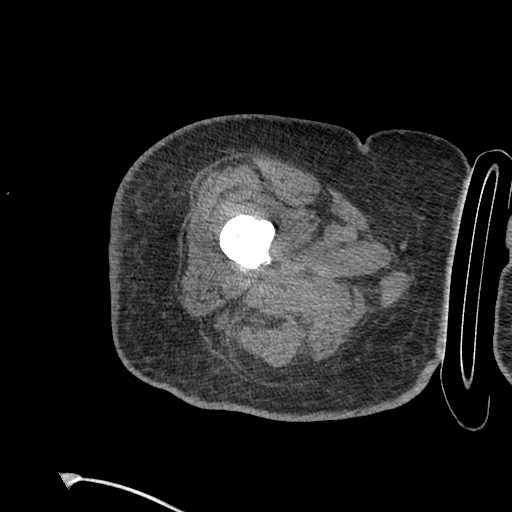
[im 529/794  soft-tissue]
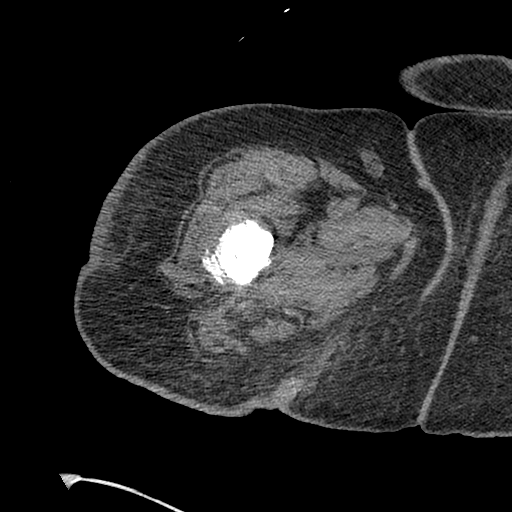
[im 595/794  soft-tissue]
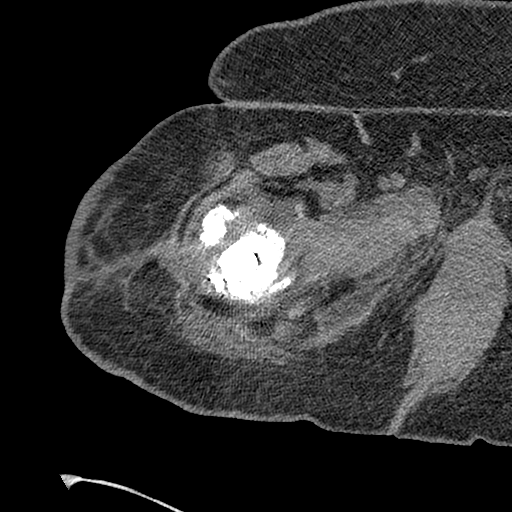
[im 595/794  bone]
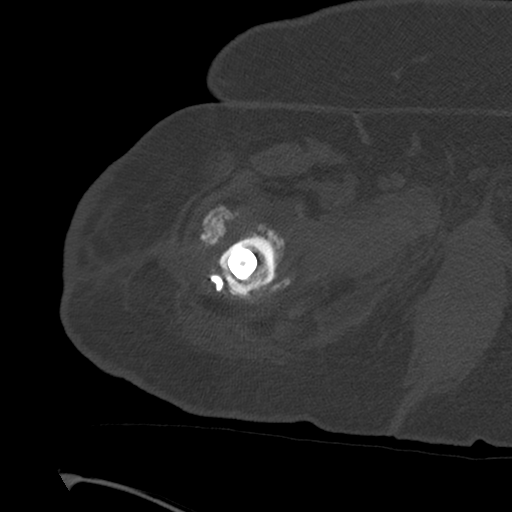
[im 661/794  soft-tissue]
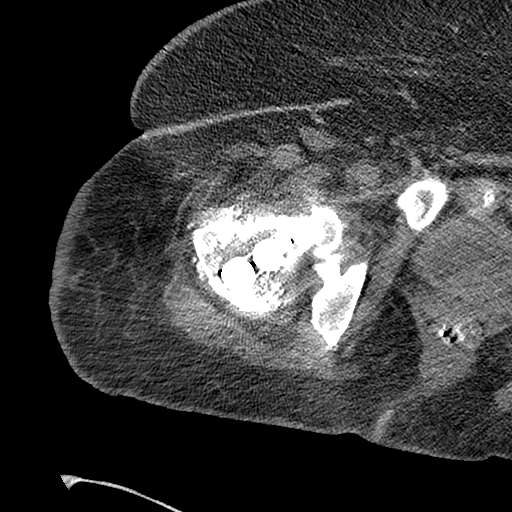
[im 727/794  soft-tissue]
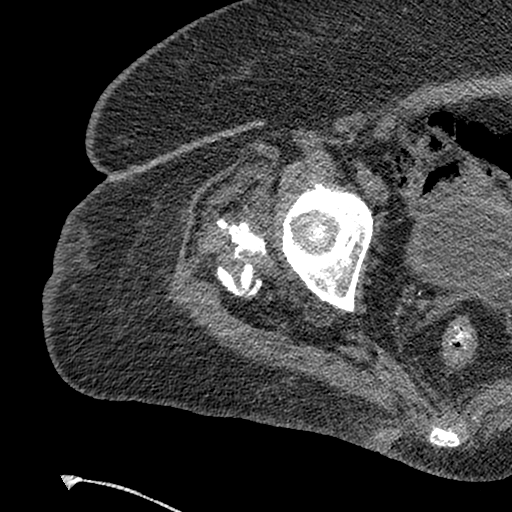

[14 of 46 positions shown; findings below may reference images not displayed]

FINDINGS: Bones/Joint/Cartilage

Similar appearance of the ununited, comminuted right inter
trochanteric fracture transfixed with an intramedullary nail and
interlocking femoral neck screws. There is callus formation without
significant bony bridging. Persistent low-attenuation material along
the lateral margin of the fracture (axial images 58-65). There is a
soft tissue tract extending towards the skin surface, and skin
thickening of the lateral thigh. There is no new focal fluid
collection. Hardware is intact without evidence of loosening or new
fracture.

Partially visualized tricompartment osteoarthritis of the right
knee. Severe right hip osteoarthritis. There is no hip effusion.

Ligaments

Suboptimally assessed by CT.

Muscles and Tendons

No muscle atrophy.

Soft tissues

No well-defined/rim enhancing fluid collection.
IMPRESSION: Similar appearance of the ununited, comminuted right
intertrochanteric fracture with intramedullary nail and and screw
fixation. Intact hardware without evidence of loosening. Persistent
soft tissue tract extending towards the skin surface with underlying
low attenuation material along the lateral margin of the fracture,
possibly residual blood products, however infectious process cannot
be completely excluded. There is no well-defined/drainable fluid
collection. If there is concern for soft tissue infection extending
from the fracture site, MRI of the right hip utilizing metal
artifact reduction sequences ([facility] protocol) may be helpful for
further evaluation.
